# Patient Record
Sex: Female | Born: 1950 | Race: White | Hispanic: No | Smoking: Current every day smoker
Health system: Southern US, Community
[De-identification: ages and names within clinical notes are randomized; demographics above are authoritative.]

## PROBLEM LIST (undated history)

## (undated) ENCOUNTER — Emergency Department (HOSPITAL_COMMUNITY): Disposition: A | Discharge status: Other Acute Inpatient Hospital | Payer: Medicare Other

## (undated) DIAGNOSIS — R0602 Shortness of breath: Secondary | ICD-10-CM

## (undated) DIAGNOSIS — F1110 Opioid abuse, uncomplicated: Secondary | ICD-10-CM

## (undated) DIAGNOSIS — K219 Gastro-esophageal reflux disease without esophagitis: Secondary | ICD-10-CM

## (undated) DIAGNOSIS — K3184 Gastroparesis: Secondary | ICD-10-CM

## (undated) DIAGNOSIS — T7840XA Allergy, unspecified, initial encounter: Secondary | ICD-10-CM

## (undated) DIAGNOSIS — F32A Depression, unspecified: Secondary | ICD-10-CM

## (undated) DIAGNOSIS — K262 Acute duodenal ulcer with both hemorrhage and perforation: Secondary | ICD-10-CM

## (undated) DIAGNOSIS — S2249XB Multiple fractures of ribs, unspecified side, initial encounter for open fracture: Secondary | ICD-10-CM

## (undated) DIAGNOSIS — R609 Edema, unspecified: Secondary | ICD-10-CM

## (undated) DIAGNOSIS — T4145XA Adverse effect of unspecified anesthetic, initial encounter: Secondary | ICD-10-CM

## (undated) DIAGNOSIS — Z9289 Personal history of other medical treatment: Secondary | ICD-10-CM

## (undated) DIAGNOSIS — H3530 Unspecified macular degeneration: Secondary | ICD-10-CM

## (undated) DIAGNOSIS — J189 Pneumonia, unspecified organism: Secondary | ICD-10-CM

## (undated) DIAGNOSIS — W19XXXA Unspecified fall, initial encounter: Secondary | ICD-10-CM

## (undated) DIAGNOSIS — R0902 Hypoxemia: Secondary | ICD-10-CM

## (undated) DIAGNOSIS — D649 Anemia, unspecified: Secondary | ICD-10-CM

## (undated) DIAGNOSIS — R109 Unspecified abdominal pain: Secondary | ICD-10-CM

## (undated) DIAGNOSIS — K2270 Barrett's esophagus without dysplasia: Secondary | ICD-10-CM

## (undated) DIAGNOSIS — Z9981 Dependence on supplemental oxygen: Secondary | ICD-10-CM

## (undated) DIAGNOSIS — Z934 Other artificial openings of gastrointestinal tract status: Secondary | ICD-10-CM

## (undated) DIAGNOSIS — K279 Peptic ulcer, site unspecified, unspecified as acute or chronic, without hemorrhage or perforation: Secondary | ICD-10-CM

## (undated) DIAGNOSIS — F419 Anxiety disorder, unspecified: Secondary | ICD-10-CM

## (undated) DIAGNOSIS — Z8719 Personal history of other diseases of the digestive system: Secondary | ICD-10-CM

## (undated) DIAGNOSIS — M810 Age-related osteoporosis without current pathological fracture: Secondary | ICD-10-CM

## (undated) DIAGNOSIS — T8859XA Other complications of anesthesia, initial encounter: Secondary | ICD-10-CM

## (undated) DIAGNOSIS — N951 Menopausal and female climacteric states: Secondary | ICD-10-CM

## (undated) DIAGNOSIS — Z9889 Other specified postprocedural states: Secondary | ICD-10-CM

## (undated) DIAGNOSIS — S82409A Unspecified fracture of shaft of unspecified fibula, initial encounter for closed fracture: Secondary | ICD-10-CM

## (undated) DIAGNOSIS — F329 Major depressive disorder, single episode, unspecified: Secondary | ICD-10-CM

## (undated) DIAGNOSIS — K311 Adult hypertrophic pyloric stenosis: Secondary | ICD-10-CM

## (undated) DIAGNOSIS — J449 Chronic obstructive pulmonary disease, unspecified: Secondary | ICD-10-CM

## (undated) DIAGNOSIS — G8929 Other chronic pain: Secondary | ICD-10-CM

## (undated) DIAGNOSIS — E041 Nontoxic single thyroid nodule: Secondary | ICD-10-CM

## (undated) DIAGNOSIS — K267 Chronic duodenal ulcer without hemorrhage or perforation: Secondary | ICD-10-CM

## (undated) HISTORY — DX: Peptic ulcer, site unspecified, unspecified as acute or chronic, without hemorrhage or perforation: K27.9

## (undated) HISTORY — DX: Opioid abuse, uncomplicated: F11.10

## (undated) HISTORY — DX: Other chronic pain: G89.29

## (undated) HISTORY — PX: DILATION AND CURETTAGE OF UTERUS: SHX78

## (undated) HISTORY — DX: Unspecified abdominal pain: R10.9

## (undated) HISTORY — DX: Age-related osteoporosis without current pathological fracture: M81.0

## (undated) HISTORY — DX: Anemia, unspecified: D64.9

## (undated) HISTORY — PX: HERNIA REPAIR: SHX51

## (undated) HISTORY — DX: Barrett's esophagus without dysplasia: K22.70

## (undated) HISTORY — DX: Multiple fractures of ribs, unspecified side, initial encounter for open fracture: S22.49XB

## (undated) HISTORY — DX: Hypoxemia: R09.02

## (undated) HISTORY — DX: Unspecified fracture of shaft of unspecified fibula, initial encounter for closed fracture: S82.409A

## (undated) HISTORY — DX: Gastro-esophageal reflux disease without esophagitis: K21.9

## (undated) HISTORY — PX: COLONOSCOPY: SHX174

## (undated) HISTORY — DX: Allergy, unspecified, initial encounter: T78.40XA

## (undated) HISTORY — PX: CATARACT EXTRACTION W/ INTRAOCULAR LENS  IMPLANT, BILATERAL: SHX1307

## (undated) HISTORY — DX: Acute duodenal ulcer with both hemorrhage and perforation: K26.2

## (undated) HISTORY — PX: JOINT REPLACEMENT: SHX530

## (undated) HISTORY — PX: REPAIR OF PERFORATED ULCER: SHX6065

## (undated) HISTORY — DX: Nontoxic single thyroid nodule: E04.1

## (undated) HISTORY — DX: Unspecified fall, initial encounter: W19.XXXA

## (undated) HISTORY — DX: Unspecified macular degeneration: H35.30

## (undated) HISTORY — DX: Anxiety disorder, unspecified: F41.9

## (undated) HISTORY — DX: Chronic obstructive pulmonary disease, unspecified: J44.9

---

## 1972-06-24 HISTORY — PX: TONSILLECTOMY: SUR1361

## 1997-11-14 ENCOUNTER — Other Ambulatory Visit: Admission: RE | Admit: 1997-11-14 | Discharge: 1997-11-14 | Payer: Self-pay | Admitting: Obstetrics and Gynecology

## 1998-10-15 ENCOUNTER — Emergency Department (HOSPITAL_COMMUNITY): Admission: EM | Admit: 1998-10-15 | Discharge: 1998-10-15 | Payer: Self-pay | Admitting: Emergency Medicine

## 1998-10-23 ENCOUNTER — Ambulatory Visit (HOSPITAL_COMMUNITY): Admission: RE | Admit: 1998-10-23 | Discharge: 1998-10-23 | Payer: Self-pay | Admitting: Gastroenterology

## 1998-10-23 ENCOUNTER — Encounter: Payer: Self-pay | Admitting: Gastroenterology

## 1999-03-27 ENCOUNTER — Other Ambulatory Visit: Admission: RE | Admit: 1999-03-27 | Discharge: 1999-03-27 | Payer: Self-pay | Admitting: Obstetrics and Gynecology

## 1999-07-13 ENCOUNTER — Other Ambulatory Visit: Admission: RE | Admit: 1999-07-13 | Discharge: 1999-07-13 | Payer: Self-pay | Admitting: Obstetrics and Gynecology

## 2000-09-16 ENCOUNTER — Other Ambulatory Visit: Admission: RE | Admit: 2000-09-16 | Discharge: 2000-09-16 | Payer: Self-pay | Admitting: Obstetrics and Gynecology

## 2002-01-29 ENCOUNTER — Emergency Department (HOSPITAL_COMMUNITY): Admission: EM | Admit: 2002-01-29 | Discharge: 2002-01-29 | Payer: Self-pay | Admitting: Emergency Medicine

## 2002-01-29 ENCOUNTER — Encounter: Payer: Self-pay | Admitting: Emergency Medicine

## 2002-08-05 ENCOUNTER — Ambulatory Visit (HOSPITAL_COMMUNITY): Admission: RE | Admit: 2002-08-05 | Discharge: 2002-08-05 | Payer: Self-pay | Admitting: Internal Medicine

## 2002-09-24 ENCOUNTER — Encounter: Payer: Self-pay | Admitting: Internal Medicine

## 2002-09-24 ENCOUNTER — Ambulatory Visit (HOSPITAL_COMMUNITY): Admission: RE | Admit: 2002-09-24 | Discharge: 2002-09-24 | Payer: Self-pay | Admitting: Internal Medicine

## 2002-10-13 ENCOUNTER — Encounter (HOSPITAL_COMMUNITY): Admission: RE | Admit: 2002-10-13 | Discharge: 2003-01-11 | Payer: Self-pay | Admitting: Internal Medicine

## 2002-10-14 ENCOUNTER — Encounter: Payer: Self-pay | Admitting: Internal Medicine

## 2003-03-29 ENCOUNTER — Encounter: Payer: Self-pay | Admitting: Family Medicine

## 2003-03-29 ENCOUNTER — Ambulatory Visit (HOSPITAL_COMMUNITY): Admission: RE | Admit: 2003-03-29 | Discharge: 2003-03-29 | Payer: Self-pay | Admitting: Family Medicine

## 2003-06-05 ENCOUNTER — Encounter: Payer: Self-pay | Admitting: Internal Medicine

## 2003-06-25 HISTORY — PX: LAPAROSCOPIC NISSEN FUNDOPLICATION: SHX1932

## 2003-10-11 ENCOUNTER — Encounter: Admission: RE | Admit: 2003-10-11 | Discharge: 2003-10-11 | Payer: Self-pay | Admitting: Family Medicine

## 2003-11-20 ENCOUNTER — Emergency Department (HOSPITAL_COMMUNITY): Admission: EM | Admit: 2003-11-20 | Discharge: 2003-11-20 | Payer: Self-pay | Admitting: Emergency Medicine

## 2004-04-26 ENCOUNTER — Ambulatory Visit: Payer: Self-pay | Admitting: Critical Care Medicine

## 2004-06-26 DIAGNOSIS — K262 Acute duodenal ulcer with both hemorrhage and perforation: Secondary | ICD-10-CM

## 2004-06-26 HISTORY — DX: Acute duodenal ulcer with both hemorrhage and perforation: K26.2

## 2004-08-30 ENCOUNTER — Inpatient Hospital Stay (HOSPITAL_COMMUNITY): Admission: EM | Admit: 2004-08-30 | Discharge: 2004-09-05 | Payer: Self-pay | Admitting: Emergency Medicine

## 2004-08-30 ENCOUNTER — Encounter (INDEPENDENT_AMBULATORY_CARE_PROVIDER_SITE_OTHER): Payer: Self-pay | Admitting: *Deleted

## 2004-08-31 ENCOUNTER — Ambulatory Visit: Payer: Self-pay | Admitting: Internal Medicine

## 2004-10-03 ENCOUNTER — Ambulatory Visit (HOSPITAL_COMMUNITY): Admission: RE | Admit: 2004-10-03 | Discharge: 2004-10-03 | Payer: Self-pay | Admitting: Surgery

## 2004-10-12 ENCOUNTER — Ambulatory Visit (HOSPITAL_COMMUNITY): Admission: RE | Admit: 2004-10-12 | Discharge: 2004-10-12 | Payer: Self-pay | Admitting: Internal Medicine

## 2004-10-12 ENCOUNTER — Ambulatory Visit: Payer: Self-pay | Admitting: Internal Medicine

## 2004-12-02 ENCOUNTER — Emergency Department (HOSPITAL_COMMUNITY): Admission: EM | Admit: 2004-12-02 | Discharge: 2004-12-03 | Payer: Self-pay | Admitting: Emergency Medicine

## 2004-12-11 ENCOUNTER — Ambulatory Visit: Payer: Self-pay | Admitting: Internal Medicine

## 2004-12-28 ENCOUNTER — Ambulatory Visit: Payer: Self-pay | Admitting: Family Medicine

## 2005-03-21 ENCOUNTER — Ambulatory Visit: Payer: Self-pay | Admitting: Family Medicine

## 2005-03-26 ENCOUNTER — Ambulatory Visit: Payer: Self-pay | Admitting: Internal Medicine

## 2005-03-26 ENCOUNTER — Encounter (INDEPENDENT_AMBULATORY_CARE_PROVIDER_SITE_OTHER): Payer: Self-pay | Admitting: *Deleted

## 2005-06-14 ENCOUNTER — Ambulatory Visit: Payer: Self-pay | Admitting: Family Medicine

## 2005-07-03 DIAGNOSIS — K2270 Barrett's esophagus without dysplasia: Secondary | ICD-10-CM | POA: Insufficient documentation

## 2005-07-16 ENCOUNTER — Ambulatory Visit: Payer: Self-pay | Admitting: Family Medicine

## 2005-09-02 ENCOUNTER — Ambulatory Visit: Payer: Self-pay | Admitting: Family Medicine

## 2005-09-25 ENCOUNTER — Emergency Department (HOSPITAL_COMMUNITY): Admission: EM | Admit: 2005-09-25 | Discharge: 2005-09-25 | Payer: Self-pay | Admitting: Emergency Medicine

## 2005-10-01 ENCOUNTER — Ambulatory Visit: Payer: Self-pay | Admitting: Internal Medicine

## 2005-10-04 ENCOUNTER — Encounter (INDEPENDENT_AMBULATORY_CARE_PROVIDER_SITE_OTHER): Payer: Self-pay | Admitting: Specialist

## 2005-10-04 ENCOUNTER — Ambulatory Visit: Payer: Self-pay | Admitting: Internal Medicine

## 2006-02-17 ENCOUNTER — Ambulatory Visit: Payer: Self-pay | Admitting: Family Medicine

## 2006-04-09 ENCOUNTER — Ambulatory Visit: Payer: Self-pay | Admitting: Internal Medicine

## 2006-04-17 ENCOUNTER — Inpatient Hospital Stay (HOSPITAL_COMMUNITY): Admission: AD | Admit: 2006-04-17 | Discharge: 2006-04-22 | Payer: Self-pay | Admitting: Internal Medicine

## 2006-04-17 ENCOUNTER — Ambulatory Visit: Payer: Self-pay | Admitting: Internal Medicine

## 2006-04-17 ENCOUNTER — Encounter (INDEPENDENT_AMBULATORY_CARE_PROVIDER_SITE_OTHER): Payer: Self-pay | Admitting: *Deleted

## 2006-04-22 ENCOUNTER — Ambulatory Visit: Payer: Self-pay | Admitting: Internal Medicine

## 2006-05-29 ENCOUNTER — Ambulatory Visit (HOSPITAL_COMMUNITY): Admission: RE | Admit: 2006-05-29 | Discharge: 2006-05-29 | Payer: Self-pay | Admitting: Internal Medicine

## 2006-09-24 ENCOUNTER — Ambulatory Visit: Payer: Self-pay | Admitting: Internal Medicine

## 2006-09-25 ENCOUNTER — Ambulatory Visit (HOSPITAL_COMMUNITY): Admission: RE | Admit: 2006-09-25 | Discharge: 2006-09-25 | Payer: Self-pay | Admitting: Internal Medicine

## 2006-10-08 ENCOUNTER — Ambulatory Visit: Payer: Self-pay | Admitting: Internal Medicine

## 2006-10-09 ENCOUNTER — Ambulatory Visit: Payer: Self-pay | Admitting: Internal Medicine

## 2006-12-09 ENCOUNTER — Ambulatory Visit: Payer: Self-pay | Admitting: Family Medicine

## 2006-12-23 HISTORY — PX: TRUNCAL VAGOTOMY: SHX2582

## 2006-12-24 ENCOUNTER — Encounter (INDEPENDENT_AMBULATORY_CARE_PROVIDER_SITE_OTHER): Payer: Self-pay | Admitting: Surgery

## 2006-12-25 ENCOUNTER — Inpatient Hospital Stay (HOSPITAL_COMMUNITY): Admission: RE | Admit: 2006-12-25 | Discharge: 2006-12-28 | Payer: Self-pay | Admitting: Surgery

## 2007-01-07 DIAGNOSIS — K219 Gastro-esophageal reflux disease without esophagitis: Secondary | ICD-10-CM

## 2007-01-07 DIAGNOSIS — F411 Generalized anxiety disorder: Secondary | ICD-10-CM

## 2007-01-07 DIAGNOSIS — N951 Menopausal and female climacteric states: Secondary | ICD-10-CM

## 2007-01-07 DIAGNOSIS — J45909 Unspecified asthma, uncomplicated: Secondary | ICD-10-CM | POA: Insufficient documentation

## 2007-01-07 HISTORY — DX: Menopausal and female climacteric states: N95.1

## 2007-02-01 ENCOUNTER — Encounter: Payer: Self-pay | Admitting: Family Medicine

## 2007-02-25 ENCOUNTER — Encounter: Admission: RE | Admit: 2007-02-25 | Discharge: 2007-02-25 | Payer: Self-pay | Admitting: Surgery

## 2007-03-16 ENCOUNTER — Ambulatory Visit: Payer: Self-pay | Admitting: Family Medicine

## 2007-03-24 ENCOUNTER — Encounter: Payer: Self-pay | Admitting: Family Medicine

## 2007-04-02 ENCOUNTER — Inpatient Hospital Stay (HOSPITAL_COMMUNITY): Admission: EM | Admit: 2007-04-02 | Discharge: 2007-04-03 | Payer: Self-pay | Admitting: Emergency Medicine

## 2007-04-02 ENCOUNTER — Ambulatory Visit: Payer: Self-pay | Admitting: Internal Medicine

## 2007-04-06 ENCOUNTER — Ambulatory Visit: Payer: Self-pay | Admitting: Internal Medicine

## 2007-04-08 ENCOUNTER — Telehealth: Payer: Self-pay | Admitting: Family Medicine

## 2007-04-20 ENCOUNTER — Ambulatory Visit: Payer: Self-pay | Admitting: Internal Medicine

## 2007-04-21 ENCOUNTER — Ambulatory Visit: Payer: Self-pay | Admitting: Family Medicine

## 2007-04-30 ENCOUNTER — Ambulatory Visit: Payer: Self-pay | Admitting: Family Medicine

## 2007-05-04 ENCOUNTER — Encounter: Payer: Self-pay | Admitting: Family Medicine

## 2007-05-04 ENCOUNTER — Ambulatory Visit: Payer: Self-pay | Admitting: Internal Medicine

## 2007-05-08 ENCOUNTER — Telehealth: Payer: Self-pay | Admitting: Family Medicine

## 2007-05-25 ENCOUNTER — Telehealth: Payer: Self-pay | Admitting: Family Medicine

## 2007-06-01 ENCOUNTER — Encounter: Payer: Self-pay | Admitting: Family Medicine

## 2007-06-03 ENCOUNTER — Ambulatory Visit: Payer: Self-pay | Admitting: Family Medicine

## 2007-06-03 DIAGNOSIS — K3184 Gastroparesis: Secondary | ICD-10-CM

## 2007-06-03 DIAGNOSIS — K911 Postgastric surgery syndromes: Secondary | ICD-10-CM

## 2007-06-03 HISTORY — DX: Gastroparesis: K31.84

## 2007-06-06 ENCOUNTER — Emergency Department (HOSPITAL_COMMUNITY): Admission: EM | Admit: 2007-06-06 | Discharge: 2007-06-07 | Payer: Self-pay | Admitting: Emergency Medicine

## 2007-06-07 ENCOUNTER — Ambulatory Visit: Payer: Self-pay | Admitting: Psychiatry

## 2007-06-07 ENCOUNTER — Inpatient Hospital Stay (HOSPITAL_COMMUNITY): Admission: AD | Admit: 2007-06-07 | Discharge: 2007-06-11 | Payer: Self-pay | Admitting: Psychiatry

## 2007-06-23 ENCOUNTER — Telehealth: Payer: Self-pay | Admitting: Family Medicine

## 2007-07-15 ENCOUNTER — Telehealth: Payer: Self-pay | Admitting: Family Medicine

## 2007-07-23 ENCOUNTER — Ambulatory Visit: Payer: Self-pay | Admitting: Internal Medicine

## 2007-08-21 ENCOUNTER — Ambulatory Visit: Payer: Self-pay | Admitting: Family Medicine

## 2007-08-21 LAB — CONVERTED CEMR LAB
ALT: 57 units/L — ABNORMAL HIGH (ref 0–35)
AST: 48 units/L — ABNORMAL HIGH (ref 0–37)
Bilirubin, Direct: 0.2 mg/dL (ref 0.0–0.3)
Total Protein: 6.3 g/dL (ref 6.0–8.3)

## 2007-08-26 LAB — CONVERTED CEMR LAB: Valproic Acid Lvl: 68.7 ug/mL (ref 50.0–100.0)

## 2007-08-28 DIAGNOSIS — K311 Adult hypertrophic pyloric stenosis: Secondary | ICD-10-CM

## 2007-08-28 HISTORY — DX: Adult hypertrophic pyloric stenosis: K31.1

## 2007-09-23 ENCOUNTER — Telehealth: Payer: Self-pay | Admitting: Family Medicine

## 2007-10-07 ENCOUNTER — Ambulatory Visit: Payer: Self-pay | Admitting: Internal Medicine

## 2007-10-23 ENCOUNTER — Ambulatory Visit (HOSPITAL_COMMUNITY): Admission: RE | Admit: 2007-10-23 | Discharge: 2007-10-23 | Payer: Self-pay | Admitting: Internal Medicine

## 2007-10-28 ENCOUNTER — Telehealth: Payer: Self-pay | Admitting: Family Medicine

## 2007-10-28 ENCOUNTER — Ambulatory Visit: Payer: Self-pay | Admitting: Family Medicine

## 2007-11-06 ENCOUNTER — Ambulatory Visit: Payer: Self-pay | Admitting: Internal Medicine

## 2007-11-10 ENCOUNTER — Telehealth: Payer: Self-pay | Admitting: Internal Medicine

## 2007-12-21 ENCOUNTER — Ambulatory Visit: Payer: Self-pay | Admitting: Internal Medicine

## 2007-12-27 ENCOUNTER — Emergency Department (HOSPITAL_COMMUNITY): Admission: EM | Admit: 2007-12-27 | Discharge: 2007-12-27 | Payer: Self-pay | Admitting: Emergency Medicine

## 2007-12-29 ENCOUNTER — Ambulatory Visit: Payer: Self-pay | Admitting: Family Medicine

## 2008-01-05 ENCOUNTER — Ambulatory Visit: Payer: Self-pay | Admitting: Internal Medicine

## 2008-01-05 ENCOUNTER — Inpatient Hospital Stay (HOSPITAL_COMMUNITY): Admission: EM | Admit: 2008-01-05 | Discharge: 2008-01-08 | Payer: Self-pay | Admitting: Emergency Medicine

## 2008-01-07 ENCOUNTER — Encounter: Payer: Self-pay | Admitting: Gastroenterology

## 2008-01-08 ENCOUNTER — Ambulatory Visit: Payer: Self-pay | Admitting: Gastroenterology

## 2008-01-11 ENCOUNTER — Telehealth: Payer: Self-pay | Admitting: Internal Medicine

## 2008-01-12 ENCOUNTER — Telehealth: Payer: Self-pay | Admitting: Internal Medicine

## 2008-01-12 ENCOUNTER — Ambulatory Visit: Payer: Self-pay | Admitting: Internal Medicine

## 2008-01-12 LAB — CONVERTED CEMR LAB
Basophils Absolute: 0 10*3/uL (ref 0.0–0.1)
Lymphocytes Relative: 15.5 % (ref 12.0–46.0)
MCHC: 34.2 g/dL (ref 30.0–36.0)
Monocytes Relative: 7.3 % (ref 3.0–12.0)
Neutrophils Relative %: 76.1 % (ref 43.0–77.0)
RDW: 18 % — ABNORMAL HIGH (ref 11.5–14.6)

## 2008-01-14 ENCOUNTER — Telehealth: Payer: Self-pay | Admitting: Internal Medicine

## 2008-01-14 ENCOUNTER — Emergency Department (HOSPITAL_COMMUNITY): Admission: EM | Admit: 2008-01-14 | Discharge: 2008-01-14 | Payer: Self-pay | Admitting: Emergency Medicine

## 2008-01-14 ENCOUNTER — Encounter (INDEPENDENT_AMBULATORY_CARE_PROVIDER_SITE_OTHER): Payer: Self-pay | Admitting: Emergency Medicine

## 2008-01-14 ENCOUNTER — Ambulatory Visit: Payer: Self-pay | Admitting: Surgery

## 2008-01-15 ENCOUNTER — Telehealth: Payer: Self-pay | Admitting: Internal Medicine

## 2008-01-19 ENCOUNTER — Ambulatory Visit: Payer: Self-pay | Admitting: Family Medicine

## 2008-01-19 DIAGNOSIS — R609 Edema, unspecified: Secondary | ICD-10-CM

## 2008-01-19 HISTORY — DX: Edema, unspecified: R60.9

## 2008-01-25 ENCOUNTER — Telehealth: Payer: Self-pay | Admitting: Family Medicine

## 2008-01-28 ENCOUNTER — Telehealth: Payer: Self-pay | Admitting: Family Medicine

## 2008-01-29 ENCOUNTER — Telehealth: Payer: Self-pay | Admitting: Family Medicine

## 2008-02-08 ENCOUNTER — Emergency Department (HOSPITAL_COMMUNITY): Admission: EM | Admit: 2008-02-08 | Discharge: 2008-02-08 | Payer: Self-pay | Admitting: Emergency Medicine

## 2008-02-09 ENCOUNTER — Inpatient Hospital Stay (HOSPITAL_COMMUNITY): Admission: EM | Admit: 2008-02-09 | Discharge: 2008-02-26 | Payer: Self-pay | Admitting: Emergency Medicine

## 2008-02-09 ENCOUNTER — Telehealth: Payer: Self-pay | Admitting: Internal Medicine

## 2008-02-16 ENCOUNTER — Encounter (INDEPENDENT_AMBULATORY_CARE_PROVIDER_SITE_OTHER): Payer: Self-pay | Admitting: Surgery

## 2008-02-20 HISTORY — PX: GASTROJEJUNOSTOMY: SHX1697

## 2008-02-23 ENCOUNTER — Ambulatory Visit: Payer: Self-pay | Admitting: Vascular Surgery

## 2008-02-24 ENCOUNTER — Encounter (INDEPENDENT_AMBULATORY_CARE_PROVIDER_SITE_OTHER): Payer: Self-pay | Admitting: General Surgery

## 2008-03-04 ENCOUNTER — Encounter: Payer: Self-pay | Admitting: Internal Medicine

## 2008-03-10 ENCOUNTER — Telehealth: Payer: Self-pay | Admitting: Family Medicine

## 2008-03-18 ENCOUNTER — Ambulatory Visit: Payer: Self-pay | Admitting: Family Medicine

## 2008-03-22 ENCOUNTER — Encounter: Admission: RE | Admit: 2008-03-22 | Discharge: 2008-03-22 | Payer: Self-pay | Admitting: Family Medicine

## 2008-03-23 ENCOUNTER — Encounter: Payer: Self-pay | Admitting: Family Medicine

## 2008-04-11 ENCOUNTER — Ambulatory Visit: Payer: Self-pay | Admitting: Family Medicine

## 2008-04-22 ENCOUNTER — Encounter: Admission: RE | Admit: 2008-04-22 | Discharge: 2008-04-22 | Payer: Self-pay | Admitting: Obstetrics and Gynecology

## 2008-05-02 ENCOUNTER — Encounter: Payer: Self-pay | Admitting: Internal Medicine

## 2008-06-24 HISTORY — PX: ORIF HIP FRACTURE: SHX2125

## 2008-06-24 HISTORY — PX: FRACTURE SURGERY: SHX138

## 2008-06-29 ENCOUNTER — Encounter: Admission: RE | Admit: 2008-06-29 | Discharge: 2008-06-29 | Payer: Self-pay | Admitting: Orthopedic Surgery

## 2008-07-13 ENCOUNTER — Encounter: Admission: RE | Admit: 2008-07-13 | Discharge: 2008-08-09 | Payer: Self-pay | Admitting: Orthopedic Surgery

## 2008-07-14 ENCOUNTER — Encounter: Payer: Self-pay | Admitting: Internal Medicine

## 2008-08-10 ENCOUNTER — Emergency Department (HOSPITAL_COMMUNITY): Admission: EM | Admit: 2008-08-10 | Discharge: 2008-08-11 | Payer: Self-pay | Admitting: Emergency Medicine

## 2008-08-12 ENCOUNTER — Encounter: Admission: RE | Admit: 2008-08-12 | Discharge: 2008-08-12 | Payer: Self-pay | Admitting: Surgery

## 2008-09-13 ENCOUNTER — Other Ambulatory Visit: Payer: Self-pay | Admitting: Emergency Medicine

## 2008-09-13 ENCOUNTER — Inpatient Hospital Stay (HOSPITAL_COMMUNITY): Admission: AD | Admit: 2008-09-13 | Discharge: 2008-09-16 | Payer: Self-pay | Admitting: Psychiatry

## 2008-09-13 ENCOUNTER — Ambulatory Visit: Payer: Self-pay | Admitting: Psychiatry

## 2008-09-26 ENCOUNTER — Ambulatory Visit: Payer: Self-pay | Admitting: Family Medicine

## 2008-10-18 ENCOUNTER — Emergency Department (HOSPITAL_COMMUNITY): Admission: EM | Admit: 2008-10-18 | Discharge: 2008-10-18 | Payer: Self-pay | Admitting: Emergency Medicine

## 2008-10-22 ENCOUNTER — Emergency Department (HOSPITAL_COMMUNITY): Admission: EM | Admit: 2008-10-22 | Discharge: 2008-10-23 | Payer: Self-pay | Admitting: Emergency Medicine

## 2008-12-23 ENCOUNTER — Emergency Department (HOSPITAL_COMMUNITY): Admission: EM | Admit: 2008-12-23 | Discharge: 2008-12-23 | Payer: Self-pay | Admitting: Emergency Medicine

## 2009-01-09 ENCOUNTER — Ambulatory Visit: Payer: Self-pay | Admitting: Family Medicine

## 2009-01-12 ENCOUNTER — Encounter: Payer: Self-pay | Admitting: Family Medicine

## 2009-01-30 ENCOUNTER — Telehealth: Payer: Self-pay | Admitting: Family Medicine

## 2009-02-22 ENCOUNTER — Emergency Department (HOSPITAL_COMMUNITY): Admission: EM | Admit: 2009-02-22 | Discharge: 2009-02-22 | Payer: Self-pay | Admitting: Emergency Medicine

## 2009-02-24 ENCOUNTER — Encounter: Payer: Self-pay | Admitting: Family Medicine

## 2009-04-03 ENCOUNTER — Encounter: Payer: Self-pay | Admitting: Family Medicine

## 2009-04-04 ENCOUNTER — Encounter: Payer: Self-pay | Admitting: Family Medicine

## 2009-05-11 ENCOUNTER — Ambulatory Visit: Payer: Self-pay | Admitting: Family Medicine

## 2009-05-15 ENCOUNTER — Emergency Department (HOSPITAL_COMMUNITY): Admission: EM | Admit: 2009-05-15 | Discharge: 2009-05-15 | Payer: Self-pay | Admitting: Emergency Medicine

## 2009-05-29 ENCOUNTER — Telehealth: Payer: Self-pay | Admitting: Internal Medicine

## 2009-05-31 ENCOUNTER — Ambulatory Visit: Payer: Self-pay | Admitting: Family Medicine

## 2009-06-19 ENCOUNTER — Telehealth: Payer: Self-pay | Admitting: Family Medicine

## 2009-06-20 ENCOUNTER — Telehealth: Payer: Self-pay | Admitting: Family Medicine

## 2009-08-11 ENCOUNTER — Ambulatory Visit: Payer: Self-pay | Admitting: Family Medicine

## 2009-08-11 DIAGNOSIS — A689 Relapsing fever, unspecified: Secondary | ICD-10-CM

## 2009-08-11 LAB — CONVERTED CEMR LAB
Blood in Urine, dipstick: NEGATIVE
Glucose, Urine, Semiquant: NEGATIVE
Ketones, urine, test strip: NEGATIVE
Specific Gravity, Urine: 1.025
pH: 5

## 2009-08-14 LAB — CONVERTED CEMR LAB
ALT: 10 units/L (ref 0–35)
Alkaline Phosphatase: 74 units/L (ref 39–117)
Basophils Relative: 1.5 % (ref 0.0–3.0)
Bilirubin, Direct: 0 mg/dL (ref 0.0–0.3)
Chloride: 108 meq/L (ref 96–112)
Creatinine, Ser: 0.7 mg/dL (ref 0.4–1.2)
Eosinophils Relative: 1.9 % (ref 0.0–5.0)
Hemoglobin: 10.6 g/dL — ABNORMAL LOW (ref 12.0–15.0)
MCV: 84.4 fL (ref 78.0–100.0)
Monocytes Absolute: 0.6 10*3/uL (ref 0.1–1.0)
Neutro Abs: 5.5 10*3/uL (ref 1.4–7.7)
Neutrophils Relative %: 69.7 % (ref 43.0–77.0)
Potassium: 4.2 meq/L (ref 3.5–5.1)
RBC: 3.91 M/uL (ref 3.87–5.11)
Sodium: 137 meq/L (ref 135–145)
Total Protein: 5.6 g/dL — ABNORMAL LOW (ref 6.0–8.3)
WBC: 7.8 10*3/uL (ref 4.5–10.5)

## 2009-08-15 ENCOUNTER — Encounter: Payer: Self-pay | Admitting: Family Medicine

## 2009-08-16 ENCOUNTER — Ambulatory Visit: Payer: Self-pay | Admitting: Family Medicine

## 2009-08-17 ENCOUNTER — Ambulatory Visit: Payer: Self-pay | Admitting: Internal Medicine

## 2009-08-18 ENCOUNTER — Ambulatory Visit: Payer: Self-pay | Admitting: Cardiology

## 2009-08-18 ENCOUNTER — Telehealth: Payer: Self-pay | Admitting: Family Medicine

## 2009-08-21 ENCOUNTER — Telehealth: Payer: Self-pay | Admitting: Family Medicine

## 2009-08-24 ENCOUNTER — Telehealth: Payer: Self-pay | Admitting: Family Medicine

## 2009-08-25 ENCOUNTER — Encounter: Payer: Self-pay | Admitting: Family Medicine

## 2009-08-31 ENCOUNTER — Encounter: Payer: Self-pay | Admitting: Family Medicine

## 2009-09-08 ENCOUNTER — Encounter: Payer: Self-pay | Admitting: Family Medicine

## 2009-10-11 ENCOUNTER — Telehealth: Payer: Self-pay | Admitting: Family Medicine

## 2009-10-17 ENCOUNTER — Telehealth: Payer: Self-pay | Admitting: Family Medicine

## 2009-10-27 ENCOUNTER — Encounter: Payer: Self-pay | Admitting: Family Medicine

## 2009-11-16 ENCOUNTER — Telehealth: Payer: Self-pay | Admitting: Family Medicine

## 2009-11-17 ENCOUNTER — Ambulatory Visit: Payer: Self-pay | Admitting: Family Medicine

## 2009-11-17 DIAGNOSIS — G2401 Drug induced subacute dyskinesia: Secondary | ICD-10-CM | POA: Insufficient documentation

## 2009-11-24 ENCOUNTER — Telehealth: Payer: Self-pay | Admitting: Family Medicine

## 2009-11-27 ENCOUNTER — Telehealth: Payer: Self-pay | Admitting: Family Medicine

## 2009-11-28 ENCOUNTER — Encounter: Payer: Self-pay | Admitting: Family Medicine

## 2009-12-08 ENCOUNTER — Telehealth: Payer: Self-pay | Admitting: Family Medicine

## 2009-12-13 ENCOUNTER — Telehealth: Payer: Self-pay | Admitting: Family Medicine

## 2009-12-22 ENCOUNTER — Encounter: Payer: Self-pay | Admitting: Family Medicine

## 2009-12-29 HISTORY — PX: ESOPHAGOGASTRODUODENOSCOPY: SHX1529

## 2010-01-08 ENCOUNTER — Encounter: Payer: Self-pay | Admitting: Family Medicine

## 2010-01-10 ENCOUNTER — Telehealth: Payer: Self-pay | Admitting: Family Medicine

## 2010-01-10 DIAGNOSIS — F323 Major depressive disorder, single episode, severe with psychotic features: Secondary | ICD-10-CM

## 2010-01-11 ENCOUNTER — Telehealth: Payer: Self-pay | Admitting: Family Medicine

## 2010-03-15 ENCOUNTER — Telehealth: Payer: Self-pay | Admitting: Family Medicine

## 2010-03-15 DIAGNOSIS — J984 Other disorders of lung: Secondary | ICD-10-CM | POA: Insufficient documentation

## 2010-04-16 ENCOUNTER — Telehealth: Payer: Self-pay | Admitting: Family Medicine

## 2010-04-16 ENCOUNTER — Ambulatory Visit: Payer: Self-pay | Admitting: Family Medicine

## 2010-06-06 DIAGNOSIS — F331 Major depressive disorder, recurrent, moderate: Secondary | ICD-10-CM

## 2010-06-07 ENCOUNTER — Ambulatory Visit: Payer: Self-pay | Admitting: Family Medicine

## 2010-06-08 LAB — CONVERTED CEMR LAB
AST: 17 units/L (ref 0–37)
Alkaline Phosphatase: 64 units/L (ref 39–117)
BUN: 12 mg/dL (ref 6–23)
Basophils Absolute: 0.1 10*3/uL (ref 0.0–0.1)
Calcium: 8.8 mg/dL (ref 8.4–10.5)
GFR calc non Af Amer: 79.33 mL/min (ref >60.00)
Glucose, Bld: 106 mg/dL — ABNORMAL HIGH (ref 70–99)
Lymphocytes Relative: 23.7 % (ref 12.0–46.0)
Monocytes Relative: 7.5 % (ref 3.0–12.0)
Platelets: 237 10*3/uL (ref 150.0–400.0)
RDW: 15.3 % — ABNORMAL HIGH (ref 11.5–14.6)
Sodium: 139 meq/L (ref 135–145)
Total Bilirubin: 0.2 mg/dL — ABNORMAL LOW (ref 0.3–1.2)

## 2010-06-27 ENCOUNTER — Encounter: Payer: Self-pay | Admitting: Family Medicine

## 2010-07-05 ENCOUNTER — Telehealth: Payer: Self-pay | Admitting: Family Medicine

## 2010-07-15 ENCOUNTER — Encounter: Payer: Self-pay | Admitting: Family Medicine

## 2010-07-24 NOTE — Procedures (Signed)
Summary: [facility] End[facility]  [facility] Endoscopy/[facility]   Imported By: Maryln Gottron 11/10/2009 12:42:40  _____________________________________________________________________  External Attachment:    Type:   Image     Comment:   External Document

## 2010-07-24 NOTE — Progress Notes (Signed)
Summary: cindy please call  Phone Note Call from Patient Call back at Home Phone [phone]   Caller: Patient Call For: Nelwyn Salisbury MD Summary of Call: pt would like cindy to return her call concerning pain management center. Initial call taken by: Heron Sabins,  August 21, 2009 11:29 AM  Follow-up for Phone Call        [facility]   Alfred Levins, [place]  August 21, 2009 12:59 PM  Additional Follow-up for Phone Call Additional follow up Details #1::        pt wants to know if she withdraws from the pain clinic if you would support that.  She is crying.  Pt has gone 12 days without any medicine and she is a wreck.  Her mom doesn't want anything to do with her and she does not know what else to do.  She is weak and can't function. Additional Follow-up by: Alfred Levins, CMA,  August 21, 2009 3:10 PM    Additional Follow-up for Phone Call Additional follow up Details #2::    as I have told her before, I cannot supply her with pain meds. I suggest she stay with her pain clinic or else check herself into a detox/rehab facility to get off narcotics safely Follow-up by: Nelwyn Salisbury MD,  August 21, 2009 4:42 PM  Additional Follow-up for Phone Call Additional follow up Details #3:: Details for Additional Follow-up Action Taken: [facility]   Alfred Levins, CMA  August 21, 2009 4:51 PM pt aware Additional Follow-up by: Alfred Levins, CMA,  August 22, 2009 9:46 AM

## 2010-07-24 NOTE — Progress Notes (Signed)
Summary: CT results  Phone Note Call from Patient   Caller: Patient Call For: Nelwyn Salisbury MD Summary of Call: Pt is calling for CT results. [phone] Initial call taken by: Lynann Beaver CMA,  August 18, 2009 2:21 PM  Follow-up for Phone Call        see report Follow-up by: Nelwyn Salisbury MD,  August 18, 2009 5:13 PM  Additional Follow-up for Phone Call Additional follow up Details #1::        pt aware Additional Follow-up by: Alfred Levins, CMA,  August 18, 2009 5:16 PM

## 2010-07-24 NOTE — Letter (Signed)
Summary: [facility]  [facility]   Imported By: Maryln Gottron 09/06/2009 09:27:02  _____________________________________________________________________  External Attachment:    Type:   Image     Comment:   External Document

## 2010-07-24 NOTE — Progress Notes (Signed)
  Phone Note Call from Patient Call back at Home Phone [phone]   Caller: Patient Call For: Nelwyn Salisbury MD Reason for Call: Talk to Nurse Summary of Call: patient called for judy but would not leave any information Initial call taken by: Kern Reap CMA Duncan Dull),  January 11, 2010 2:31 PM

## 2010-07-24 NOTE — Assessment & Plan Note (Signed)
Summary: med check/cjr   Vital Signs:  Patient profile:   60 year old female Weight:      123 pounds O2 Sat:      92 % Temp:     98.1 degrees F Pulse rate:   74 / minute BP sitting:   100 / 62  Vitals Entered By: Pura Spice, RN (April 16, 2010 10:14 AM) CC: med reck    History of Present Illness: Here to ask for help with anxiety and pain management. She still goes to her pain management clinic, but she says they don't give her enough pain meds. She is in constant abdominal pain, and taking Vicodin several times a day is not enough. She is extremely anxious all the time, and she can't sleep. She had some good results with Ambien in the past, and she would like some more. She is on at least 4mg  a day of Alprazolam, but often takes more.   Preventive Screening-Counseling & Management  Alcohol-Tobacco     Smoking Status: current     Packs/Day: 2.0     Year Started: 1967  Allergies: 1)  ! Morphine 2)  ! * Seroquel 3)  Chantix (Varenicline Tartrate) 4)  Xanax (Alprazolam)  Past History:  Past Medical History: Reviewed history from 08/11/2009 and no changes required.  Peptic ulcer disease, sees Dr. Randa Evens gastric outlet obstruction gastroparesis GASTRIC OUTLET OBSTRUCTION (ICD-537.0) CIGARETTE SMOKER (ICD-305.1) ACUTE DUODEN ULCER W/HEMORR PERF&OBSTRUCTION (ICD-532.21) BARRETT'S ESOPHAGUS, HX OF (ICD-V12.79) ANXIETY (ICD-300.00) MENOPAUSE (ICD-627.2) GERD (ICD-530.81) ASTHMA (ICD-493.90) chronic abdominal pain with narcotic dependence, sees Dr. Elvin So at [facility]  Past Surgical History: multiple EGDs, last on 12-29-09 per Dr. Lynnell Grain at [facility], several gastric ulcers  laparoscopic Nissan, vagotomy, and Roux-en-Y gastrojejunostomy per Dr. Daphine Deutscher 02-20-08  Social History: Packs/Day:  2.0  Review of Systems  The patient denies anorexia, fever, weight loss, weight gain, vision loss, decreased hearing, hoarseness, chest pain, syncope,  dyspnea on exertion, peripheral edema, prolonged cough, headaches, hemoptysis, melena, hematochezia, severe indigestion/heartburn, hematuria, incontinence, genital sores, muscle weakness, suspicious skin lesions, transient blindness, difficulty walking, depression, unusual weight change, abnormal bleeding, enlarged lymph nodes, angioedema, breast masses, and testicular masses.    Physical Exam  General:  Well-developed,well-nourished,in no acute distress; alert,appropriate and cooperative throughout examination Abdomen:  soft, normal bowel sounds, no distention, no masses, no guarding, no rigidity, no rebound tenderness, no abdominal hernia, no hepatomegaly, and no splenomegaly.  Diffusely tender Psych:  Oriented X3, memory intact for recent and remote, normally interactive, good eye contact, and moderately anxious.     Impression & Recommendations:  Problem # 1:  SYMPTOM, PAIN, ABDOMINAL, GENERALIZED (ICD-789.07)  Problem # 2:  PEPTIC ULCER DISEASE (ICD-533.90)  Her updated medication list for this problem includes:    Carafate 1 Gm/6ml Susp (Sucralfate) .Marland Kitchen... Take 10 ml (2 teaspoons) by mouth four times per day    Prilosec 40 Mg Cpdr (Omeprazole) ..... Once daily  Problem # 3:  ANXIETY (ICD-300.00)  The following medications were removed from the medication list:    Alprazolam 1 Mg Tabs (Alprazolam) .Marland KitchenMarland KitchenMarland KitchenMarland Kitchen 4 times a day Her updated medication list for this problem includes:    Prozac 40 Mg Caps (Fluoxetine hcl) .Marland Kitchen... 2 tabs once daily    Alprazolam 2 Mg Tabs (Alprazolam) ..... One tab 4 times a day  Complete Medication List: 1)  Promethazine Hcl 25 Mg Tabs (Promethazine hcl) .Marland Kitchen.. 1 q 6 hours as needed nausea 2)  Carafate 1 Gm/53ml Susp (Sucralfate) .Marland KitchenMarland KitchenMarland Kitchen  Take 10 ml (2 teaspoons) by mouth four times per day 3)  Prozac 40 Mg Caps (Fluoxetine hcl) .... 2 tabs once daily 4)  Prilosec 40 Mg Cpdr (Omeprazole) .... Once daily 5)  Feosol 200 (65 Fe) Mg Tabs (Ferrous sulfate dried) 6)   Hydrocodone-acetaminophen 10-325 Mg Tabs (Hydrocodone-acetaminophen) .Marland Kitchen.. 1 qid 7)  Alprazolam 2 Mg Tabs (Alprazolam) .... One tab 4 times a day 8)  Zolpidem Tartrate 10 Mg Tabs (Zolpidem tartrate) .... At bedtime  Patient Instructions: 1)  I told her that she needs to talk to her pain management doctor about her pain needs, but that I would try to help her anxiety. We will increase Alprazolam to 2 mg 4 times a day. Add back Zolpidem.  2)  Please schedule a follow-up appointment as needed .  Prescriptions: ZOLPIDEM TARTRATE 10 MG TABS (ZOLPIDEM TARTRATE) at bedtime  #30 x 5   Entered and Authorized by:   Nelwyn Salisbury MD   Signed by:   Nelwyn Salisbury MD on 04/16/2010   Method used:   Print then Give to Patient   RxID:   [id number] ALPRAZOLAM 2 MG TABS (ALPRAZOLAM) one tab 4 times a day  #120 x 5   Entered and Authorized by:   Nelwyn Salisbury MD   Signed by:   Nelwyn Salisbury MD on 04/16/2010   Method used:   Print then Give to Patient   RxID:   [id number]    Orders Added: 1)  Est. Patient Level IV [84696]

## 2010-07-24 NOTE — Procedures (Signed)
Summary: EGD Report/[facility]  EGD Report/[facility]   Imported By: Maryln Gottron 01/04/2010 14:30:41  _____________________________________________________________________  External Attachment:    Type:   Image     Comment:   External Document

## 2010-07-24 NOTE — Progress Notes (Signed)
Summary: judy please call  Phone Note Call from Patient Call back at Home Phone [phone]   Caller: Patient Call For: Nelwyn Salisbury MD Reason for Call: Insurance Question Summary of Call: pt would like judy to return her call, pt was seen last wk Initial call taken by: Heron Sabins,  November 27, 2009 2:56 PM  Follow-up for Phone Call        [facility] Follow-up by: Raechel Ache, RN,  November 27, 2009 2:58 PM  Additional Follow-up for Phone Call Additional follow up Details #1::        wanted something for her "nerves"; advised she'll have to wait for Dr Clent Ridges to return. Additional Follow-up by: Raechel Ache, RN,  November 27, 2009 3:14 PM

## 2010-07-24 NOTE — Miscellaneous (Signed)
Summary: Orders Update  Clinical Lists Changes  Orders: Added new Test order of T-2 View CXR (71020TC) - Signed 

## 2010-07-24 NOTE — Assessment & Plan Note (Signed)
Summary: fever x 2 weeks/dm   Vital Signs:  Patient profile:   60 year old female Weight:      109 pounds BMI:     19.38 Temp:     99.0 degrees F oral BP sitting:   110 / 66  (left arm) Cuff size:   regular  Vitals Entered By: Alfred Levins, CMA (August 11, 2009 2:09 PM) CC: low grade fever off and on x2 wks, Back Pain   History of Present Illness: here with her mother with concerns over an intermittent low grade fever for the past 2 weeks and for a 20 lb weight loss over the past month. No other specific symptoms at all, other than her usual chonic abdominal pains. No sinus symptoms or ST or cough or urinary symptoms.    Current Medications (verified): 1)  Promethazine Hcl 25 Mg  Tabs (Promethazine Hcl) .Marland Kitchen.. 1 Q 6 Hours As Needed Nausea 2)  Carafate 1 Gm/52ml Susp (Sucralfate) .... Take 10 Ml (2 Teaspoons) By Mouth Four Times Per Day 3)  Prozac 40 Mg Caps (Fluoxetine Hcl) .... 2 Tabs Once Daily 4)  Alprazolam 1 Mg Tabs (Alprazolam) .... 4 Times A Day 5)  Suboxone 2-0.5 Mg Subl (Buprenorphine Hcl-Naloxone Hcl) .... As Needed 6)  Nicotrol 10 Mg Inha (Nicotine) .... Use Daily As Needed 7)  Prilosec 40 Mg Cpdr (Omeprazole) .... Once Daily  Allergies (verified): 1)  ! Morphine 2)  Chantix (Varenicline Tartrate) 3)  Xanax (Alprazolam)  Past History:  Past Medical History:  Peptic ulcer disease, sees Dr. Randa Evens gastric outlet obstruction gastroparesis GASTRIC OUTLET OBSTRUCTION (ICD-537.0) CIGARETTE SMOKER (ICD-305.1) ACUTE DUODEN ULCER W/HEMORR PERF&OBSTRUCTION (ICD-532.21) BARRETT'S ESOPHAGUS, HX OF (ICD-V12.79) ANXIETY (ICD-300.00) MENOPAUSE (ICD-627.2) GERD (ICD-530.81) ASTHMA (ICD-493.90) chronic abdominal pain with narcotic dependence, sees Dr. Elvin So at [facility]  Past Surgical History: Reviewed history from 09/26/2008 and no changes required. Endoscopy laparoscopic Nissan, vagotomy, and Roux-en-Y gastrojejunostomy per Dr. Daphine Deutscher  02-20-08  Review of Systems  The patient denies anorexia, weight gain, vision loss, decreased hearing, hoarseness, chest pain, syncope, dyspnea on exertion, peripheral edema, prolonged cough, headaches, hemoptysis, abdominal pain, melena, hematochezia, severe indigestion/heartburn, hematuria, incontinence, genital sores, muscle weakness, suspicious skin lesions, transient blindness, difficulty walking, depression, unusual weight change, abnormal bleeding, enlarged lymph nodes, angioedema, breast masses, and testicular masses.    Physical Exam  General:  Well-developed,well-nourished,in no acute distress; alert,appropriate and cooperative throughout examination Head:  Normocephalic and atraumatic without obvious abnormalities. No apparent alopecia or balding. Eyes:  No corneal or conjunctival inflammation noted. EOMI. Perrla. Funduscopic exam benign, without hemorrhages, exudates or papilledema. Vision grossly normal. Ears:  External ear exam shows no significant lesions or deformities.  Otoscopic examination reveals clear canals, tympanic membranes are intact bilaterally without bulging, retraction, inflammation or discharge. Hearing is grossly normal bilaterally. Nose:  External nasal examination shows no deformity or inflammation. Nasal mucosa are pink and moist without lesions or exudates. Mouth:  Oral mucosa and oropharynx without lesions or exudates.  Teeth in good repair. Neck:  No deformities, masses, or tenderness noted. Lungs:  Normal respiratory effort, chest expands symmetrically. Lungs are clear to auscultation, no crackles or wheezes. Heart:  Normal rate and regular rhythm. S1 and S2 normal without gallop, murmur, click, rub or other extra sounds. Abdomen:  soft, normal bowel sounds, no distention, no masses, no guarding, no rigidity, no rebound tenderness, no abdominal hernia, no inguinal hernia, no hepatomegaly, and no splenomegaly.  Mildly tender diffusely. Skin:  Intact without  suspicious lesions  or rashes Cervical Nodes:  No lymphadenopathy noted Axillary Nodes:  No palpable lymphadenopathy Inguinal Nodes:  No significant adenopathy   Impression & Recommendations:  Problem # 1:  WEIGHT LOSS (ICD-783.21)  Orders: UA Dipstick w/o Micro (automated)  (81003) Venipuncture (04540) TLB-BMP (Basic Metabolic Panel-BMET) (80048-METABOL) TLB-CBC Platelet - w/Differential (85025-CBCD) TLB-Hepatic/Liver Function Pnl (80076-HEPATIC) TLB-TSH (Thyroid Stimulating Hormone) (98119-JYN) Radiology Referral (Radiology)  Problem # 2:  FEVER, RECURRENT (ICD-087.9)  Complete Medication List: 1)  Promethazine Hcl 25 Mg Tabs (Promethazine hcl) .Marland Kitchen.. 1 q 6 hours as needed nausea 2)  Carafate 1 Gm/65ml Susp (Sucralfate) .... Take 10 ml (2 teaspoons) by mouth four times per day 3)  Prozac 40 Mg Caps (Fluoxetine hcl) .... 2 tabs once daily 4)  Alprazolam 1 Mg Tabs (Alprazolam) .... 4 times a day 5)  Suboxone 2-0.5 Mg Subl (Buprenorphine hcl-naloxone hcl) .... As needed 6)  Nicotrol 10 Mg Inha (Nicotine) .... Use daily as needed 7)  Prilosec 40 Mg Cpdr (Omeprazole) .... Once daily  Patient Instructions: 1)  No specific explanations for her fever or weight loss are apparent today. Noted is her long hx of tobacco use. Get labs today. Set up contrasted CT of chest, abdomen, and pelvis next week. Encouraged her to eat as much nutritious food as possible.   Laboratory Results   Urine Tests    Routine Urinalysis   Color: yellow Appearance: Clear Glucose: negative   (Normal Range: Negative) Bilirubin: 1+   (Normal Range: Negative) Ketone: negative   (Normal Range: Negative) Spec. Gravity: 1.025   (Normal Range: 1.003-1.035) Blood: negative   (Normal Range: Negative) pH: 5.0   (Normal Range: 5.0-8.0) Protein: trace   (Normal Range: Negative) Urobilinogen: 0.2   (Normal Range: 0-1) Nitrite: negative   (Normal Range: Negative) Leukocyte Esterace: trace   (Normal Range:  Negative)    Comments: [name]  August 11, 2009 3:56 PM

## 2010-07-24 NOTE — Progress Notes (Signed)
Summary: wants to quit smoking  Phone Note Call from Patient Call back at Home Phone [phone]   Caller: Patient Call For: [name] Salisbury MD Summary of Call: wants to quit smoking. Tried Chantix before and it made her sick. Is there anything else? Asked about counseling or hypnotism? Initial call taken by: Raechel Ache, RN,  October 11, 2009 3:08 PM  Follow-up for Phone Call        both of those are viable options but insurance will not cover them. Please try whatever she wants. OTC patches are helpful.  Follow-up by: [name] Salisbury MD,  October 16, 2009 9:03 AM  Additional Follow-up for Phone Call Additional follow up Details #1::        Phone Call Completed Additional Follow-up by: Raechel Ache, RN,  October 16, 2009 11:20 AM

## 2010-07-24 NOTE — Letter (Signed)
Summary: [facility]  [facility]   Imported By: Maryln Gottron 01/11/2010 14:40:27  _____________________________________________________________________  External Attachment:    Type:   Image     Comment:   External Document

## 2010-07-24 NOTE — Progress Notes (Signed)
Summary: please return call  Phone Note Call from Patient Call back at Home Phone [phone]   Caller: Patient--live call Summary of Call: have more ? for judy. please return call Initial call taken by: Warnell Forester,  December 13, 2009 2:28 PM  Follow-up for Phone Call        wants to know how long she should wait after taking Benadryl- still having signs. Per Dr Clent Ridges go to ER if not better 4 hrs after Benadryl. Follow-up by: Raechel Ache, RN,  December 13, 2009 3:51 PM

## 2010-07-24 NOTE — Progress Notes (Signed)
Summary: rx depakote   Phone Note Call from Patient   Caller: Patient Call For: Nelwyn Salisbury MD Summary of Call: Pt is asking for a medication for OCD. [phone] Initial call taken by: [facility] CMA AAMA,  April 16, 2010 2:58 PM  Follow-up for Phone Call        call in Depakote 250 mg at bedtime , #30 with 2 rf. Take this in addition to her other meds.  Follow-up by: Nelwyn Salisbury MD,  April 16, 2010 4:50 PM  Additional Follow-up for Phone Call Additional follow up Details #1::        pt notified and requested to call to [place].  Additional Follow-up by: Pura Spice, RN,  April 17, 2010 8:05 AM    New/Updated Medications: DEPAKOTE 250 MG TBEC (DIVALPROEX SODIUM) take 1 at bedtime Prescriptions: DEPAKOTE 250 MG TBEC (DIVALPROEX SODIUM) take 1 at bedtime  #30 x 2   Entered by:   Pura Spice, RN   Authorized by:   Nelwyn Salisbury MD   Signed by:   Pura Spice, RN on 04/17/2010   Method used:   Electronically to        [facility]* (retail)       [address]       [place], [place]  [id number]       Ph: [phone]       Fax: [phone]   RxID:   [id number]

## 2010-07-24 NOTE — Progress Notes (Signed)
Summary: questions  Phone Note Call from Patient   Caller: Patient Call For: Nelwyn Salisbury MD Summary of Call: Pt is asking for a name of someone that can do counseling and hypnotism. Is  requesting Ambien to be called to [facility]. [phone]  Initial call taken by: Lynann Beaver CMA,  December 08, 2009 3:09 PM  Follow-up for Phone Call        I suggest she call Towanda Malkin, a therapist at our [place] office. She should call [phone] to set up an appt. Also, call in Ambien 10 mg at bedtime #30 with 2 rf Follow-up by: Nelwyn Salisbury MD,  December 08, 2009 5:17 PM  Additional Follow-up for Phone Call Additional follow up Details #1::        LMOM, Rx phoned in. Additional Follow-up by: Raechel Ache, RN,  December 08, 2009 5:21 PM    Additional Follow-up for Phone Call Additional follow up Details #2::    given name of therapist. Follow-up by: Raechel Ache, RN,  December 11, 2009 10:29 AM  New/Updated Medications: AMBIEN 10 MG TABS (ZOLPIDEM TARTRATE) 1 at bedtime Prescriptions: AMBIEN 10 MG TABS (ZOLPIDEM TARTRATE) 1 at bedtime  #30 x 2   Entered by:   Raechel Ache, RN   Authorized by:   Nelwyn Salisbury MD   Signed by:   Raechel Ache, RN on 12/11/2009   Method used:   Historical   RxID:   [id number]

## 2010-07-24 NOTE — Progress Notes (Signed)
Summary: CT scan  Phone Note Call from Patient Call back at Home Phone [phone]   Caller: Patient Call For: Nelwyn Salisbury MD Summary of Call: Pt states it is time to reschedule her 6 months chest CT. Initial call taken by: Lynann Beaver CMA,  March 15, 2010 1:33 PM  Follow-up for Phone Call        I agree. Set up a contrasted chest CT to follow up right lung nodules seen in February  Follow-up by: Nelwyn Salisbury MD,  March 16, 2010 8:27 AM  Additional Follow-up for Phone Call Additional follow up Details #1::        referral sent to terri.  pt notifed Camelia Eng will call her.  Additional Follow-up by: Pura Spice, RN,  March 16, 2010 12:15 PM  New Problems: LUNG NODULE [phone])   New Problems: LUNG NODULE (ICD-518.89)

## 2010-07-24 NOTE — Progress Notes (Signed)
Summary: neurological complaints  Phone Note Call from Patient   Caller: Patient Call For: Nelwyn Salisbury MD Summary of Call: Pt. calls "obviously stuttering"  and is complaining of shaking.......stuttering.  She thinks it is coming from the Seroquel.  Spoke to Dr. Lovell Sheehan, and he suggested to the ER if continues to get worse.  Stop the Seroquel, and see Dr. Clent Ridges tomorrow if she feels comfortable waiting.  Pt. and her roomate seem very anxious and apprehensive........advised ER.  They plan to go ASAP.  She has no weakness of any extremity or slurred speech. Initial call taken by: Lynann Beaver CMA,  Nov 16, 2009 1:43 PM  Follow-up for Phone Call        noted Follow-up by: Nelwyn Salisbury MD,  Nov 17, 2009 8:21 AM

## 2010-07-24 NOTE — Procedures (Signed)
Summary: Upper GI Endoscopy/[facility]  Upper GI Endoscopy/[facility]   Imported By: Maryln Gottron 09/19/2009 14:55:03  _____________________________________________________________________  External Attachment:    Type:   Image     Comment:   External Document

## 2010-07-24 NOTE — Assessment & Plan Note (Signed)
Summary: stuttering/?meds/njr   Vital Signs:  Patient profile:   60 year old female Weight:      117 pounds BMI:     20.80 Temp:     99.7 degrees F oral BP sitting:   128 / 88  (left arm) Cuff size:   regular  Vitals Entered By: Raechel Ache, RN (Nov 17, 2009 10:28 AM) CC: C/o shaking and stuttering since Wed.    History of Present Illness: Here with her partner for 3 days of involuntary twitches and jerks of her face, mouth, and neck. This makes it very difficult for her to talk. No SOB or any other symptoms. She has been taking Seroquel for about 4 weeks now, and it is noted that she took this fer several months last year and tolerated it well. Dr. Randa Evens recently did a repeat EGD and found her to have several gastric ulcers. She also has a chronically elevated gastrin level, and he thinks this is repsonsibile for most of her GI issues. He has referred her to a GI specialist in this area at [facility], Dr. Leonarda Salon, and he will see her on 11-27-09.   Allergies: 1)  ! Morphine 2)  ! * Seroquel 3)  Chantix (Varenicline Tartrate) 4)  Xanax (Alprazolam)  Past History:  Past Medical History: Reviewed history from 08/11/2009 and no changes required.  Peptic ulcer disease, sees Dr. Randa Evens gastric outlet obstruction gastroparesis GASTRIC OUTLET OBSTRUCTION (ICD-537.0) CIGARETTE SMOKER (ICD-305.1) ACUTE DUODEN ULCER W/HEMORR PERF&OBSTRUCTION (ICD-532.21) BARRETT'S ESOPHAGUS, HX OF (ICD-V12.79) ANXIETY (ICD-300.00) MENOPAUSE (ICD-627.2) GERD (ICD-530.81) ASTHMA (ICD-493.90) chronic abdominal pain with narcotic dependence, sees Dr. Elvin So at [facility]  Past Surgical History: Reviewed history from 09/26/2008 and no changes required. Endoscopy laparoscopic Nissan, vagotomy, and Roux-en-Y gastrojejunostomy per Dr. Daphine Deutscher 02-20-08  Review of Systems  The patient denies anorexia, fever, weight loss, weight gain, vision loss, decreased hearing, hoarseness,  chest pain, syncope, dyspnea on exertion, peripheral edema, prolonged cough, headaches, hemoptysis, abdominal pain, melena, hematochezia, severe indigestion/heartburn, hematuria, incontinence, genital sores, muscle weakness, suspicious skin lesions, transient blindness, difficulty walking, depression, unusual weight change, abnormal bleeding, enlarged lymph nodes, angioedema, breast masses, and testicular masses.    Physical Exam  General:  alert, has a lot of trouble speaking due to tics as below Head:  Normocephalic and atraumatic without obvious abnormalities. No apparent alopecia or balding. Eyes:  No corneal or conjunctival inflammation noted. EOMI. Perrla. Funduscopic exam benign, without hemorrhages, exudates or papilledema. Vision grossly normal. Ears:  External ear exam shows no significant lesions or deformities.  Otoscopic examination reveals clear canals, tympanic membranes are intact bilaterally without bulging, retraction, inflammation or discharge. Hearing is grossly normal bilaterally. Nose:  External nasal examination shows no deformity or inflammation. Nasal mucosa are pink and moist without lesions or exudates. Mouth:  Oral mucosa and oropharynx without lesions or exudates.  Teeth in good repair. Neck:  No deformities, masses, or tenderness noted. Lungs:  Normal respiratory effort, chest expands symmetrically. Lungs are clear to auscultation, no crackles or wheezes. Heart:  Normal rate and regular rhythm. S1 and S2 normal without gallop, murmur, click, rub or other extra sounds. Neurologic:  alert & oriented X3 and gait normal.  Has frequent twitches and jerks of the facial muscles, the mouth, the throat, and the neck.  Psych:  Oriented X3, good eye contact, and moderately anxious.     Impression & Recommendations:  Problem # 1:  TARDIVE DYSKINESIA (ICD-333.85)  Orders: Benadryl  IM or IV (  J1200) Admin of Therapeutic Inj  intramuscular or subcutaneous (42595)  Problem # 2:   PEPTIC ULCER DISEASE (ICD-533.90)  Her updated medication list for this problem includes:    Carafate 1 Gm/81ml Susp (Sucralfate) .Marland Kitchen... Take 10 ml (2 teaspoons) by mouth four times per day    Prilosec 40 Mg Cpdr (Omeprazole) ..... Once daily  Problem # 3:  GASTRIC OUTLET OBSTRUCTION (ICD-537.0)  Problem # 4:  ANXIETY (ICD-300.00)  Her updated medication list for this problem includes:    Prozac 40 Mg Caps (Fluoxetine hcl) .Marland Kitchen... 2 tabs once daily    Alprazolam 1 Mg Tabs (Alprazolam) .Marland KitchenMarland KitchenMarland KitchenMarland Kitchen 4 times a day  Complete Medication List: 1)  Promethazine Hcl 25 Mg Tabs (Promethazine hcl) .Marland Kitchen.. 1 q 6 hours as needed nausea 2)  Carafate 1 Gm/7ml Susp (Sucralfate) .... Take 10 ml (2 teaspoons) by mouth four times per day 3)  Prozac 40 Mg Caps (Fluoxetine hcl) .... 2 tabs once daily 4)  Alprazolam 1 Mg Tabs (Alprazolam) .... 4 times a day 5)  Suboxone 2-0.5 Mg Subl (Buprenorphine hcl-naloxone hcl) .... As needed 6)  Nicotrol 10 Mg Inha (Nicotine) .... Use daily as needed 7)  Prilosec 40 Mg Cpdr (Omeprazole) .... Once daily 8)  Oxycodone Hcl 5 Mg Tabs (Oxycodone hcl) .Marland Kitchen.. 1 three times a day  Patient Instructions: 1)  Stop Seroquel. Given an injection of Benadryl today. She will use 50 mg of Benadryl every 4 hours at home until this subsides. Call me next week with an update. Go to the ER if this gets worse. Prescriptions: ALPRAZOLAM 1 MG TABS (ALPRAZOLAM) 4 times a day  #120 x 5   Entered and Authorized by:   Nelwyn Salisbury MD   Signed by:   Nelwyn Salisbury MD on 11/17/2009   Method used:   Print then Give to Patient   RxID:   [id number] PROZAC 40 MG CAPS (FLUOXETINE HCL) 2 tabs once daily  #60 x 11   Entered and Authorized by:   Nelwyn Salisbury MD   Signed by:   Nelwyn Salisbury MD on 11/17/2009   Method used:   Print then Give to Patient   RxID:   [id number]    Medication Administration  Injection # 1:    Medication: Benadryl  IM or IV    Diagnosis: TARDIVE DYSKINESIA  (ICD-333.85)    Route: IM    Site: L deltoid    Exp Date: 2/12    Lot #: [id number]    Mfr: baxter    Comments: 50mg  given    Patient tolerated injection without complications    Given by: Raechel Ache, RN (Nov 17, 2009 11:38 AM)  Orders Added: 1)  Est. Patient Level IV [02542] 2)  Benadryl  IM or IV [J1200] 3)  Admin of Therapeutic Inj  intramuscular or subcutaneous [70623]

## 2010-07-24 NOTE — Progress Notes (Signed)
Summary: shaking and stuttering  Phone Note Call from Patient   Caller: Patient Call For: Nelwyn Salisbury MD Summary of Call: Pt is taking Hydrocodone and is shaking and stuttering again x 10 days.  Is asking what to do? [phone] Initial call taken by: Lynann Beaver CMA,  December 13, 2009 12:18 PM  Follow-up for Phone Call        take benadryl 50 mg q 4 hours as needed . if not better go to ER Follow-up by: Nelwyn Salisbury MD,  December 13, 2009 1:58 PM  Additional Follow-up for Phone Call Additional follow up Details #1::        Pt. notifed. Additional Follow-up by: Lynann Beaver CMA,  December 13, 2009 2:04 PM

## 2010-07-24 NOTE — Progress Notes (Signed)
Summary: Needs Mental Evaluation  Phone Note Call from Patient Call back at Home Phone [phone]   Caller: Patient Summary of Call: I Have been going to pain management center for a year.  Urine was tested and showed cocaine and methodone. I do not take these drugs.  Physician advised me to get a mental evaluation before he will prescribe me any additional pain meds.  I have called everyone in [place] and no one will accept my insurance (Medicare).  Does Dr. Clent Ridges know of any place I can go to get a mental evaluation that will accept my insurance? Initial call taken by: Trixie Dredge,  January 10, 2010 4:17 PM  Follow-up for Phone Call        No but we will find someone who does. 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

## 2010-07-24 NOTE — Progress Notes (Signed)
Summary: ? med?  Phone Note Call from Patient   Caller: Patient Call For: Nelwyn Salisbury MD Summary of Call: [facility]   Could not understand her previous voice mail. Initial call taken by: Lynann Beaver CMA,  October 17, 2009 11:29 AM  Follow-up for Phone Call        Pt wants to go back on Seroquel.   Only stopped due to insurance issues, but states Medicare will pay for some of it now. [place]. Follow-up by: Lynann Beaver CMA,  October 17, 2009 12:05 PM  Additional Follow-up for Phone Call Additional follow up Details #1::        call in Seroquel 50 mg at bedtime , #30 with 5 rf Additional Follow-up by: Nelwyn Salisbury MD,  October 17, 2009 5:20 PM    New/Updated Medications: SEROQUEL 50 MG TABS (QUETIAPINE FUMARATE) 1 at bedtime Prescriptions: SEROQUEL 50 MG TABS (QUETIAPINE FUMARATE) 1 at bedtime  #30 x 5   Entered by:   Raechel Ache, RN   Authorized by:   Nelwyn Salisbury MD   Signed by:   Raechel Ache, RN on 10/17/2009   Method used:   Electronically to        [facility]* (retail)       [address]       [place], [place]  [id number]       Ph: [phone]       Fax: [phone]   RxID:   [id number]

## 2010-07-24 NOTE — Consult Note (Signed)
Summary: ENT-Dr. Narda Bonds  ENT-Dr. Narda Bonds   Imported By: Maryln Gottron 09/12/2009 09:24:24  _____________________________________________________________________  External Attachment:    Type:   Image     Comment:   External Document

## 2010-07-24 NOTE — Progress Notes (Signed)
Summary: weight loss  Phone Note Call from Patient Call back at Home Phone [phone]   Caller: Patient Call For: Nelwyn Salisbury MD Summary of Call: says she needs some help- weight is 100#- can't eat and wants to go to the hospital. Initial call taken by: Raechel Ache, RN,  August 24, 2009 11:47 AM  Follow-up for Phone Call        I cannot help her. I suggest she talk to Dr. Randa Evens, her GI doctor. If it is an emergency, go to the ER.  Follow-up by: Nelwyn Salisbury MD,  August 24, 2009 12:08 PM  Additional Follow-up for Phone Call Additional follow up Details #1::        Pt. notified. Additional Follow-up by: Lynann Beaver CMA,  August 24, 2009 12:27 PM

## 2010-07-24 NOTE — Letter (Signed)
Summary: [facility]  [facility]   Imported By: Maryln Gottron 12/11/2009 11:25:02  _____________________________________________________________________  External Attachment:    Type:   Image     Comment:   External Document

## 2010-07-24 NOTE — Progress Notes (Signed)
Summary: memory bad  Phone Note Call from Patient Call back at Home Phone [phone]   Caller: vm Summary of Call: Stuttering has passed, but memory real bad.  Is this due to coming off Seroquel? Initial call taken by: Rudy Jew, RN,  November 24, 2009 11:17 AM  Follow-up for Phone Call        yes I think so. Give it a few weeks to pass Follow-up by: Nelwyn Salisbury MD,  November 24, 2009 1:02 PM  Additional Follow-up for Phone Call Additional follow up Details #1::        Phone Call Completed Additional Follow-up by: Rudy Jew, RN,  November 24, 2009 2:18 PM

## 2010-07-26 NOTE — Letter (Signed)
Summary: [facility]  [facility]   Imported By: Maryln Gottron 07/12/2010 10:27:05  _____________________________________________________________________  External Attachment:    Type:   Image     Comment:   External Document

## 2010-07-26 NOTE — Assessment & Plan Note (Signed)
Summary: MEDICATION CONCERNS // RS   Vital Signs:  Patient profile:   60 year old female Weight:      118 pounds Temp:     99.2 degrees F oral Pulse rhythm:   regular BP sitting:   92 / 60  Vitals Entered By: Lynann Beaver CMA AAMA (June 06, 2010 10:40 AM) CC: probs with meds Is Patient Diabetic? No Pain Assessment Patient in pain? yes        History of Present Illness: Here with her friend to ask advice about her chronic abdominal pain, her depression, and her anxiety. She has been going to the [facility] for over a year now, and she is very unhappy with her care there. She has been doing reasonably well on her curent regimen of 4 hydrocodone tablets a day, and she asks if I  would be willing to take over prescribing this again. She tried the Depakote I  had given her a few months ago, and this worked very well for her. She would like to stay on this if possible. Her friend backs this up, saying that [name] has been doing much better lately than she has for the past 2 years.   Allergies: 1)  ! Morphine 2)  ! * Seroquel 3)  Chantix (Varenicline Tartrate) 4)  Xanax (Alprazolam)  Past History:  Past Medical History: Reviewed history from 08/11/2009 and no changes required.  Peptic ulcer disease, sees Dr. Randa Evens gastric outlet obstruction gastroparesis GASTRIC OUTLET OBSTRUCTION (ICD-537.0) CIGARETTE SMOKER (ICD-305.1) ACUTE DUODEN ULCER W/HEMORR PERF&OBSTRUCTION (ICD-532.21) BARRETT'S ESOPHAGUS, HX OF (ICD-V12.79) ANXIETY (ICD-300.00) MENOPAUSE (ICD-627.2) GERD (ICD-530.81) ASTHMA (ICD-493.90) chronic abdominal pain with narcotic dependence, sees Dr. Elvin So at [facility]  Past Surgical History: Reviewed history from 04/16/2010 and no changes required. multiple EGDs, last on 12-29-09 per Dr. Lynnell Grain at [facility], several gastric ulcers  laparoscopic Nissan, vagotomy, and Roux-en-Y gastrojejunostomy per Dr. Daphine Deutscher  02-20-08  Review of Systems  The patient denies anorexia, fever, weight loss, weight gain, vision loss, decreased hearing, hoarseness, chest pain, syncope, dyspnea on exertion, peripheral edema, prolonged cough, headaches, hemoptysis, melena, hematochezia, severe indigestion/heartburn, hematuria, incontinence, genital sores, muscle weakness, suspicious skin lesions, transient blindness, difficulty walking, unusual weight change, abnormal bleeding, enlarged lymph nodes, angioedema, breast masses, and testicular masses.    Physical Exam  General:  Well-developed,well-nourished,in no acute distress; alert,appropriate and cooperative throughout examination Abdomen:  soft, normal bowel sounds, no distention, no masses, no guarding, no rigidity, no rebound tenderness, no abdominal hernia, no inguinal hernia, no hepatomegaly, and no splenomegaly.  Diffusely tender Psych:  Oriented X3, memory intact for recent and remote, normally interactive, good eye contact, and depressed affect.     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 Take 10 ml (2 teaspoons) by mouth four times per day    Prilosec 40 Mg Cpdr (Omeprazole) ..... Once daily  Problem # 5:  ANXIETY (ICD-300.00)  Her updated medication list for this problem includes:    Prozac 40 Mg Caps (Fluoxetine hcl) .Marland Kitchen... 2 tabs once daily  Alprazolam 2 Mg Tabs (Alprazolam) ..... One tab 4 times a  day  Complete Medication List: 1)  Promethazine Hcl 25 Mg Tabs (Promethazine hcl) .Marland Kitchen.. 1 q 6 hours as needed nausea 2)  Carafate 1 Gm/59ml Susp (Sucralfate) .... Take 10 ml (2 teaspoons) by mouth four times per day 3)  Prozac 40 Mg Caps (Fluoxetine hcl) .... 2 tabs once daily 4)  Prilosec 40 Mg Cpdr (Omeprazole) .... Once daily 5)  Feosol 200 (65 Fe) Mg Tabs (Ferrous sulfate dried) 6)  Hydrocodone-acetaminophen 10-325 Mg Tabs (Hydrocodone-acetaminophen) .Marland Kitchen.. 1 q 6 hours 7)  Alprazolam 2 Mg Tabs (Alprazolam) .... One tab 4 times a day 8)  Zolpidem Tartrate 10 Mg Tabs (Zolpidem tartrate) .... At bedtime 9)  Depakote 250 Mg Tbec (Divalproex sodium) .... At bedtime  Patient Instructions: 1)  I told them I would take over prescribing all her meds again, but she cannot see the [facility] at all from today forward, and she understands. She can only get medication from me, and she understands.  2)  Please schedule a follow-up appointment in 1 month.  Prescriptions: DEPAKOTE 250 MG TBEC (DIVALPROEX SODIUM) at bedtime  #30 x 5   Entered and Authorized by:   Nelwyn Salisbury MD   Signed by:   Nelwyn Salisbury MD on 06/06/2010   Method used:   Print then Give to Patient   RxID:   [id number] HYDROCODONE-ACETAMINOPHEN 10-325 MG TABS (HYDROCODONE-ACETAMINOPHEN) 1 q 6 hours  #120 x 0   Entered and Authorized by:   Nelwyn Salisbury MD   Signed by:   Nelwyn Salisbury MD on 06/06/2010   Method used:   Print then Give to Patient   RxID:   [id number]    Orders Added: 1)  Est. Patient Level IV [84696] 2)  Venipuncture [29528] 3)  TLB-BMP (Basic Metabolic Panel-BMET) [80048-METABOL] 4)  TLB-CBC Platelet - w/Differential [85025-CBCD] 5)  TLB-Hepatic/Liver Function Pnl [80076-HEPATIC] 6)  TLB-TSH (Thyroid Stimulating Hormone) [84443-TSH] 7)  TLB-Ammonia [82140-AMON]

## 2010-07-26 NOTE — Progress Notes (Signed)
Summary: rx for norco   Phone Note Call from Patient Call back at Home Phone [phone]   Caller: Patient Call For: Nelwyn Salisbury MD Summary of Call: Pt states pharmacy has faxed over multiple requests for her pain meds.   Would like to speak to Almira Coaster to find out what is going on? Initial call taken by: [facility] CMA AAMA,  July 05, 2010 9:08 AM  Follow-up for Phone Call        mess left on pt cell phone we have only received 1 request and dr Deundra Furber would repond today.   Follow-up by: Pura Spice, RN,  July 05, 2010 12:10 PM

## 2010-08-01 ENCOUNTER — Other Ambulatory Visit: Payer: Self-pay | Admitting: Family Medicine

## 2010-08-01 DIAGNOSIS — R52 Pain, unspecified: Secondary | ICD-10-CM

## 2010-08-02 ENCOUNTER — Other Ambulatory Visit: Payer: Self-pay

## 2010-08-02 DIAGNOSIS — R52 Pain, unspecified: Secondary | ICD-10-CM

## 2010-08-02 MED ORDER — HYDROCODONE-ACETAMINOPHEN 10-325 MG PO TABS: 1.0000 | ORAL_TABLET | Freq: Four times a day (QID) | ORAL | Status: DC | PRN

## 2010-08-02 NOTE — Telephone Encounter (Signed)
#  60 w/5 refills on her norco called to [facility]

## 2010-08-02 NOTE — Telephone Encounter (Signed)
Olegario Messier from [facility] called and stated pt usually get 120 quanity. [facility] called and #120 given as per chart pt been getting 120

## 2010-08-02 NOTE — Telephone Encounter (Signed)
Call in 6 months

## 2010-08-07 ENCOUNTER — Inpatient Hospital Stay (HOSPITAL_COMMUNITY)
Admission: EM | Admit: 2010-08-07 | Discharge: 2010-08-14 | DRG: 481 | Disposition: A | Discharge status: Skilled Nursing Facility | Payer: MEDICARE | Attending: Orthopedic Surgery | Admitting: Orthopedic Surgery

## 2010-08-07 ENCOUNTER — Emergency Department (HOSPITAL_COMMUNITY): Payer: MEDICARE

## 2010-08-07 DIAGNOSIS — Y92009 Unspecified place in unspecified non-institutional (private) residence as the place of occurrence of the external cause: Secondary | ICD-10-CM

## 2010-08-07 DIAGNOSIS — F1120 Opioid dependence, uncomplicated: Secondary | ICD-10-CM | POA: Diagnosis present

## 2010-08-07 DIAGNOSIS — F19939 Other psychoactive substance use, unspecified with withdrawal, unspecified: Secondary | ICD-10-CM | POA: Diagnosis not present

## 2010-08-07 DIAGNOSIS — S72033A Displaced midcervical fracture of unspecified femur, initial encounter for closed fracture: Principal | ICD-10-CM | POA: Diagnosis present

## 2010-08-07 DIAGNOSIS — K219 Gastro-esophageal reflux disease without esophagitis: Secondary | ICD-10-CM | POA: Diagnosis present

## 2010-08-07 DIAGNOSIS — W010XXA Fall on same level from slipping, tripping and stumbling without subsequent striking against object, initial encounter: Secondary | ICD-10-CM | POA: Diagnosis present

## 2010-08-07 DIAGNOSIS — K3184 Gastroparesis: Secondary | ICD-10-CM | POA: Diagnosis present

## 2010-08-07 DIAGNOSIS — F17200 Nicotine dependence, unspecified, uncomplicated: Secondary | ICD-10-CM | POA: Diagnosis present

## 2010-08-07 DIAGNOSIS — D62 Acute posthemorrhagic anemia: Secondary | ICD-10-CM | POA: Diagnosis not present

## 2010-08-07 DIAGNOSIS — F341 Dysthymic disorder: Secondary | ICD-10-CM | POA: Diagnosis present

## 2010-08-07 LAB — DIFFERENTIAL
Basophils Absolute: 0.1 10*3/uL (ref 0.0–0.1)
Lymphocytes Relative: 5 % — ABNORMAL LOW (ref 12–46)
Monocytes Absolute: 1.2 10*3/uL — ABNORMAL HIGH (ref 0.1–1.0)
Monocytes Relative: 6 % (ref 3–12)
Neutro Abs: 17.6 10*3/uL — ABNORMAL HIGH (ref 1.7–7.7)

## 2010-08-07 LAB — CBC
HCT: 35 % — ABNORMAL LOW (ref 36.0–46.0)
Hemoglobin: 11.6 g/dL — ABNORMAL LOW (ref 12.0–15.0)
MCHC: 33.1 g/dL (ref 30.0–36.0)

## 2010-08-07 LAB — BASIC METABOLIC PANEL
BUN: 17 mg/dL (ref 6–23)
Chloride: 102 mEq/L (ref 96–112)
GFR calc Af Amer: >60 mL/min (ref >60)
GFR calc non Af Amer: >60 mL/min (ref >60)
Potassium: 3.7 mEq/L (ref 3.5–5.1)
Sodium: 134 mEq/L — ABNORMAL LOW (ref 135–145)

## 2010-08-07 LAB — URINALYSIS, ROUTINE W REFLEX MICROSCOPIC
Ketones, ur: NEGATIVE mg/dL
Nitrite: NEGATIVE
Specific Gravity, Urine: 1.02 (ref 1.005–1.030)
Urine Glucose, Fasting: NEGATIVE mg/dL
pH: 5 (ref 5.0–8.0)

## 2010-08-07 LAB — PROTIME-INR: Prothrombin Time: 13.9 seconds (ref 11.6–15.2)

## 2010-08-08 ENCOUNTER — Inpatient Hospital Stay (HOSPITAL_COMMUNITY): Payer: MEDICARE

## 2010-08-09 LAB — URINE CULTURE
Colony Count: NO GROWTH
Culture: NO GROWTH

## 2010-08-09 LAB — CBC
MCH: 30.2 pg (ref 26.0–34.0)
MCHC: 32.8 g/dL (ref 30.0–36.0)
Platelets: 175 10*3/uL (ref 150–400)
RBC: 2.65 MIL/uL — ABNORMAL LOW (ref 3.87–5.11)

## 2010-08-09 LAB — PROTIME-INR: Prothrombin Time: 14.2 seconds (ref 11.6–15.2)

## 2010-08-09 LAB — BASIC METABOLIC PANEL
BUN: 8 mg/dL (ref 6–23)
Calcium: 7.8 mg/dL — ABNORMAL LOW (ref 8.4–10.5)
GFR calc non Af Amer: >60 mL/min (ref >60)
Glucose, Bld: 134 mg/dL — ABNORMAL HIGH (ref 70–99)

## 2010-08-10 DIAGNOSIS — S72009A Fracture of unspecified part of neck of unspecified femur, initial encounter for closed fracture: Secondary | ICD-10-CM

## 2010-08-10 LAB — CROSSMATCH
ABO/RH(D): A POS
Unit division: 0

## 2010-08-10 LAB — BASIC METABOLIC PANEL
CO2: 25 mEq/L (ref 19–32)
Calcium: 8.1 mg/dL — ABNORMAL LOW (ref 8.4–10.5)
Creatinine, Ser: 0.52 mg/dL (ref 0.4–1.2)
GFR calc Af Amer: >60 mL/min (ref >60)

## 2010-08-10 LAB — CBC
MCH: 29.9 pg (ref 26.0–34.0)
MCHC: 33.1 g/dL (ref 30.0–36.0)
Platelets: 168 10*3/uL (ref 150–400)
RDW: 14.4 % (ref 11.5–15.5)

## 2010-08-10 LAB — PROTIME-INR
INR: 1.75 — ABNORMAL HIGH (ref 0.00–1.49)
Prothrombin Time: 20.6 seconds — ABNORMAL HIGH (ref 11.6–15.2)

## 2010-08-11 ENCOUNTER — Other Ambulatory Visit: Payer: Self-pay | Admitting: Family Medicine

## 2010-08-11 DIAGNOSIS — F0630 Mood disorder due to known physiological condition, unspecified: Secondary | ICD-10-CM

## 2010-08-11 DIAGNOSIS — T424X5A Adverse effect of benzodiazepines, initial encounter: Secondary | ICD-10-CM

## 2010-08-11 LAB — PROTIME-INR: INR: 1.88 — ABNORMAL HIGH (ref 0.00–1.49)

## 2010-08-11 LAB — BASIC METABOLIC PANEL
BUN: 4 mg/dL — ABNORMAL LOW (ref 6–23)
Chloride: 104 mEq/L (ref 96–112)
Creatinine, Ser: 0.52 mg/dL (ref 0.4–1.2)

## 2010-08-11 LAB — CBC
HCT: 31 % — ABNORMAL LOW (ref 36.0–46.0)
MCHC: 32.3 g/dL (ref 30.0–36.0)
RDW: 14.1 % (ref 11.5–15.5)

## 2010-08-12 LAB — PROTIME-INR: Prothrombin Time: 24.1 seconds — ABNORMAL HIGH (ref 11.6–15.2)

## 2010-08-12 NOTE — Consult Note (Signed)
NAMEANTIONETTE, LUSTER NO.:  [id number]  MEDICAL RECORD NO.:  [id number]           PATIENT TYPE:  I  LOCATION:  1601                         FACILITY:  [facility]  PHYSICIAN:  Marlis Edelson, DO        DATE OF BIRTH:  09/18/1950  DATE OF CONSULTATION:  08/11/2010 DATE OF DISCHARGE:                                CONSULTATION   Psychiatric Consultation  REFERRING PHYSICIAN:  Eulas Post, M.D.  REASON FOR CONSULTATION:  Possible benzodiazepine withdrawal.  HISTORY OF THE CURRENT ILLNESS:  [name] is a 60 year old Caucasian female admitted to the [facility] following a fall with a resultant fracture of her left hip.  She has undergone open reduction and internal fixation of that hip fracture.  Following the operative period, she has displayed behaviors consistent with benzodiazepine withdrawal.  She is currently receiving narcotics (postoperatively) for pain and has been placed on Valium to help with muscle spasm and augmentation of her pain medications.  On the current dose of Valium (5 mg q.6h.), the withdrawal symptoms have cleared.  She originally was showing some periods consistent with withdrawal or mild delirium that are not apparent at present.  She was clear, coherent and oriented at the time of assessment.  [name] has related a history of falls.  She relates some of these to recent changes at the home that include the use of laminated floors after a period of time of having carpets.  She was not relating other reasons for falls such as dizziness or other medical symptoms.  She has had a longstanding history of benzodiazepine use currently taking Xanax 2 mg four times a day.  She has had a history of previous hospitalizations because of opiate dependence, one of which she outlined the fact that she would run out of her Xanax before the end of the month.  There has been a suspicion of benzodiazepine abuse or dependence along  with her opiate dependence.  She relates that she has been nervous, and uses anxiolytic medications.  She feels that they keep her calm because of excessive stress.  She also stated, "I am OCD big time."  She described herself as "hyper".  She will clean excessively, talk excessively, cook excessively or do any other behavior in excess.  She likes to keep her home neat and clean.  She is obsessive about things and states that she handles those thoughts by repetitively telling herself that "you can wait."  She is currently living with a 106 year old roommate and feels that she must be a little more accepting of other individuals in her home who may not meet her standards.  She reports being on Prozac so, "I do not cry all the time."  She reports having a history of depression following the development of gastric ulcers and chronic abdominal pain following a number of surgeries because of perforated ulcers.  She had been diagnosed by Dr. Tomasa Rand, a local psychiatrist with bipolar disorder in the past.  She stated later she saw a counselor who told her that that she did not have it.  She  was placed on Depakote. She does not remember the particular side-effects, but knows on large doses that she did not tolerate the medicine well.  She did report stability on low doses of Depakote.  That medication apparently was not continued and she is not receiving current psychiatric care.  [name] does report behaviors that are consistent with bipolar disorder including excessive spending.  At times she will come home with objects that she does not need. She had a recent episode in which she lost her teeth. She described this as a period of time in which she "blacked out."  PAST PSYCHIATRIC HISTORY:  Opiate dependence; anxiety disorder-NOS; mood disorder-NOS.  She has no history of suicidal attempts and no history of self-mutilation.  She was admitted to the [facility] for  detoxification from September 13, 2008 to September 16, 2008.  Records from that time also indicate that her Xanax runs out before the end of the month.  Her other admission to behavioral hospital was from June 07, 2007 through June 11, 2007 at which time she had questionable delirium after starting Chantix.  She was also placed on a clonidine detox protocol because of excessive opiate use.  PAST MEDICAL HISTORY: 1. Gastroparesis, recurrent, gastric ulcers and perforated ulcers     resulting to numerous endoscopies and abdominal surgeries. 2. She had suffered chronic abdominal pain, bronchitis due to     excessive tobacco use. 3. Asthma. 4. Tonsillectomy. 5. She relates she is currently being followed for "a spot on her     lungs and a spot on her thyroid."  6. Osteoporosis.  ALLERGIES:  MORPHINE (itching)  CURRENT OUTPATIENT MEDICATIONS: 1. Prozac 40 mg daily. 2. Iron supplementation. 3. Ambien 5 mg q.h.s. 4. Xanax 2 mg q.i.d. p.r.n., which she uses on a consistent q.i.d.     basis. 5. Promethazine 25 mg q.4-6h. p.r.n. 6. Hydrocodone APAP 10/325 mg four tablets daily as needed. 7. Prilosec 40 mg twice per day. 8. Calcium and vitamin D supplementation.  SOCIAL HISTORY:  The patient currently lives in a condo in the [place] area and has a new roommate who is 64 years of age.  She states she has been on disability for the last 2 years because of her abdominal problem.s  She has 2 dogs.  Education: Bachelors degree in health and PE.  She has been married twice, has no children.  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  No history of Financial planner.  No history of legal entanglements.  No religious preference.  When asked about abuse, she states that she was bothered by her parents her entire life for not meeting their expectations.  FAMILY HISTORY:  Grandmother, COPD.  Mother is living at age 62.  She has had a previous history of renal carcinoma, arteriosclerotic  coronary artery disease with stent placement.  Her father died at the age of 66. He had bladder cancer, renal cancer and eventually succumbed to pancreatic cancer.  Family history of mental illness includes a parental side uncle, who suffered from schizophrenia.  No other known mental illnesses among family members.  SUBSTANCE ABUSE HISTORY:  When asked how much she smokes, she states "a lot", at least three packs of cigarettes per day.  She also reports drinking coke "a lot" at 4-5 16-ounce bottles per day.  When asked about alcohol consumption, she states she drank at parties in her 48s.  She denies any illicit drug use.  MENTAL STATUS EXAMINATION:  The patient was seen in her hospital room where she was dressed in a hospital gown and have an IV infusing.  She was pleasant and cooperative and established appropriate eye contact. She appears older than her stated age.  She was wearing a necklace and ear rings.  She sat up in bed to talk to the examiner.  Her speech was clear, coherent, regular in volume and tone.  She relates her mood as "depressed a long time".  Her affect was discongruent in that the patient displayed a full range affect during the course of the interview.  Her thought process was linear, logical and goal-directed.  Thought content: She denies perceptual symptoms, ideas of reference, delusions or paranoia.  She has no current suicidal or homicidal ideation.  Judgment is grossly intact.  Insight, a bit shallow.  Psychomotor activity was within normal limits.  ASSESSMENT:  AXIS I: 1. Opiate dependence. 2. Benzodiazepine dependence. 3. Nicotine dependence. 4. Mood disorder (primary versus secondary to medical illnesses,     cannot exclude bipolar disorder based on the current assessment) 5. History of anxiety disorder-not otherwise specified. AXIS II:  Deferred. AXIS III:  Per past medical history.  RECOMMENDATIONS:  [name] is currently on Valium 5 mg q.i.d.  for augmentation of her opiate use and muscle spasms following hip surgery.  I recommend the continuation of the Valium over the next couple of weeks as would be done normally in a patient having undergone hip surgery.  I have talked to Dr. Dion Saucier about decreasing the Valium with a taper of 5 mg per week, decreasing it until it is eventually discontinued.   I do not recommend that the Xanax be continued.  She is currently not in Xanax withdrawal and has had no resultant seizures following the implementation of the benzodiazepines/alcohol withdrawal protocol.  Given her history of opiate dependence, we must apply extreme caution with the use of these medications.  She complains of chronic pain and will need to be followed closely.  If opiates are continued long-term then that management needs to be done in a supervised setting such as a pain management clinic.  I do not recommend the unsupervised administration of benzodiazepines and opiates in this patient as she is a high risk for accidental overdose.  I will obtain a social work consultation to help arrange psychiatric followup.    [name] will need psychiatric followup for further evaluation.    On talking with Dr. Dion Saucier it appears that [name] will need a period of inpatient rehabilitation.  This is fortuitous in that it will allow the appropriate management of her pain, discontinuation of her benzodiazepines in a slow, cautious manner and the opportunity to set her up with appropriate psychiatric followup and management.  Dr. Dion Saucier has done a very good job in speaking with the patient about smoking cessation that is imperative given her lung disease and comorbid medical conditions.  Thank you for allowing me to participate in [name]'s care.          ______________________________ Marlis Edelson, DO     DB/MEDQ  D:  08/11/2010  T:  08/11/2010  Job:  [id number]  Electronically Signed by Marlis Edelson MD on  08/12/2010 08:06:36 PM

## 2010-08-13 NOTE — Discharge Summary (Signed)
[name], [name]                ACCOUNT NO.:  [id number]  MEDICAL RECORD NO.:  [id number]           PATIENT TYPE:  I  LOCATION:  1601                         FACILITY:  [facility]  PHYSICIAN:  Eulas Post, MD    DATE OF BIRTH:  03/28/1951  DATE OF ADMISSION:  08/07/2010 DATE OF DISCHARGE:  08/13/2010                        DISCHARGE SUMMARY - REFERRING   PRIMARY CARE PHYSICIAN:  Tera Mater. Clent Ridges, M.D.  ADMISSION DIAGNOSIS:  Left hip fracture.  DISCHARGE DIAGNOSES: 1. Left hip fracture. 2. Acute blood loss anemia expected. 3. Benzodiazepine withdrawal with delirium.  CONSULTING PHYSICIANS:  Marlis Edelson, D.O. with Psychiatry.  DISCHARGE DISPOSITION:  Skilled nursing facility.  DISCHARGE INSTRUCTIONS:  Weightbearing as tolerated bilateral lower extremities, change dressings every 2-3 days as needed, monitor for benzodiazepine withdrawal and delirium, and keep her wounds clean and dry.  HOSPITAL COURSE:  [name] is a 60 year old woman who has a history of narcotic dependence and benzodiazepine use who has been through rehab twice and has been at home and apparently had a fall and broke her left hip.  She underwent left hip dynamic hip screw placement. She tolerated the procedure well and was given perioperative antibiotics for antimicrobial prophylaxis.  She was also given what she reported as her home regimen and prescribed medications which did include benzodiazepines.  This quantity, based on her reported amount that she took, which did include Ativan 2 mg 4 times a day, was not sufficient to keep her from going into severe benzodiazepine withdrawal.  She was on a PCA Dilaudid as well as the benzodiazepines and subsequently developed symptoms of withdrawal.  Psychiatry consultation was requested, and she was also placed on CIWA protocol.  After substantially increasing her benzodiazepine dosing, she finally came out of withdrawal and normalized from mental status  standpoint.  She was improving and making slow progress with physical therapy.  She is now normal from her mental status standpoint.  Her hemoglobin did drop, and I did give her 2 units transfusion of packed red blood cells.  She was given perioperative antibiotics for antimicrobial prophylaxis.  She also did have difficulties with her Foley, and during her delirium, she ripped it out twice.  She did have urinary retention, at one point had an in-and-out cath for 1000 mL.  Ultimately, she was able to void independently.  She was given sequential compression devices as well as Lovenox and Coumadin for DVT prophylaxis.  Her INR was 2.4 on August 12, 2010.  After discussion with Dr. Huston Foley, I have written for a benzodiazepine taper, and I have discontinued her Ambien as well as her Ativan and her Xanax and placed her purely on diazepam 5 mg p.o. q.i.d. for a period of 2 weeks, and then she will go to t.i.d. for a period of 1 week and then to b.i.d. for a period of 1 week and then to daily for a period of 1 week, at which point she will discontinue it all together.  I have also written her for Percocet 5/325 for pain given her postoperative status. She was also given a NicoDerm patch for her severe  tobacco abuse.  All of these risk factors increase the risk that she may have substantial complications with her hip fracture, including nonunion as well as periprosthetic fracture, not to mention the health risks from the benzodiazepine withdrawal.  She is being discharged to skilled nursing facility and will follow up with me in 2 weeks and also with Dr. Huston Foley, according to his instructions.  She benefited maximally from her hospital stay.     Eulas Post, MD     JPL/MEDQ  D:  08/12/2010  T:  08/12/2010  Job:  [id number]  cc:   Tera Mater. Clent Ridges, MD [address] [place] [address]  Electronically Signed by Teryl Lucy MD on 08/13/2010 10:09:25 AM

## 2010-08-13 NOTE — Telephone Encounter (Signed)
No , I gave her #120 with 5 rf ( a 6 month supply) on 04-16-10

## 2010-08-13 NOTE — Op Note (Signed)
NAMEALDINA, PORTA                ACCOUNT NO.:  [id number]  MEDICAL RECORD NO.:  [id number]           PATIENT TYPE:  I  LOCATION:  1608                         FACILITY:  [facility]  PHYSICIAN:  Eulas Post, MD    DATE OF BIRTH:  [birth year]  DATE OF PROCEDURE:  08/08/2010 DATE OF DISCHARGE:                              OPERATIVE REPORT   SURGEON:  Eulas Post, MD  PREOPERATIVE DIAGNOSIS:  Left basicervical femoral neck fracture.  POSTOPERATIVE DIAGNOSIS:  Left basicervical femoral neck fracture.  OPERATIVE PROCEDURE:  Left dynamic hip screw placement.  ANESTHESIA:  General.  ESTIMATED BLOOD LOSS:  200 mL.  OPERATIVE IMPLANTS:  Synthes dynamic hip screw with a total of 3 distal screws and they sized 100-mm lag screw.  PREOPERATIVE INDICATIONS:  Mrs. Rosselyn Martha is a 60 year old woman who has a substantial tobacco abuse habit of three packs per day who fell and broke her left hip.  She elected to undergo the above-named procedures.  The risks, benefits and alternatives were discussed before the procedure including, but not limited to risks of infection, bleeding, nerve injury, malunion, nonunion, hardware prominence, hardware failure, need for hardware removal, need for conversion to hip arthroplasty, avascular necrosis, leg-length discrepancy, blood clots, blood transfusion, cardiopulmonary complications, among others and she is willing to proceed.  OPERATIVE PROCEDURE:  The patient was brought to the operating room, placed in supine position.  IV antibiotics were given.  General anesthesia was administered.  The left lower extremity was placed in the fracture table.  The fracture was reduced and held on the fracture table.  After a sterile prep and drape, incision was made over the proximal thigh.  Dissection was carried down through the IT band down through the vastus lateralis and this was reflected anteriorly and deep retractor was placed.  The guidepin was  introduced using the jig. Initially, it took me couple of passes to get into the exact appropriate position, but ultimately has center on the head.  The fracture was reduced slightly valgus.  I measured the pin length and selected the 100- mm blade.  I then drilled and opened the femur and then tapped and then placed the helical blade.  Excellent position was achieved within the head with appropriate tip-apex distance.  The fracture remained in a slightly valgus position.  The bite of the lag screw was such that I had to overturn the lag screw and then turned it back in order to maintain appropriate rotation on the head.  I did also used a Cobb to try to maintain the neck position, but this worked better if I just over- rotated and then turned ahead back, so I rotated it anatomically on the lateral view.  Once this had been achieved, I placed the plate and then secured it distally with bicortical nonlocking screws.  I also applied compression of the screw using the compression nut, which I then removed.  Wounds were then irrigated copiously and the fascia was closed with #1 Vicryl followed by 2-0 for subcutaneous tissue and 4-0 Monocryl with Steri- Strips and sterile gauze.  The patient was awakened,  extubated and returned to the PACU in stable satisfactory condition.  There were no complications and she tolerated the procedure well.  She will be weightbearing as tolerated and I will plan to have on Lovenox, bridging the Coumadin for a period of 1 month postoperatively.  Her tobacco abuse causes substantial risk for perioperative complication as well as the risk for avascular necrosis and nonunion; however, the alternatives is an arthroplasty, which also carries substantial risk especially given her young age.  Hopefully, she will heal this fracture and be able to return to full function.     Eulas Post, MD     JPL/MEDQ  D:  08/08/2010  T:  08/09/2010  Job:   [id number]  Electronically Signed by Teryl Lucy MD on 08/13/2010 10:09:22 AM

## 2010-08-13 NOTE — Telephone Encounter (Signed)
Faxed to [facility] 

## 2010-08-13 NOTE — H&P (Signed)
[name], [name] NO.:  [id number]  MEDICAL RECORD NO.:  [id number]           PATIENT TYPE:  E  LOCATION:  WLED                         FACILITY:  [facility]  PHYSICIAN:  [name] Post, MD    DATE OF BIRTH:  1951-01-07  DATE OF ADMISSION:  08/07/2010 DATE OF DISCHARGE:                             HISTORY & PHYSICAL   PRIMARY CARE PHYSICIAN:  [name] Mater. Clent Ridges, MD  CHIEF COMPLAINT:  Left hip pain.  HISTORY:  [name] is a 60 year old woman who smokes approximately 3 packs per day who is on chronic disability due to gastroparesis and abdominal ulcers who has been complaining of relatively poor balance with multiple falls lately.  She fell about 6 weeks ago and had an injury to her left hand with a hand fracture that has been treated.  She fell tonight and injured her left hip and was unable to walk.  She slipped on a slick floor.  She complained of 10/10 pain around her left hip, and thought "she was going to die."  Movement makes it worse.  Rest and IV pain medications make it better.  She had no loss of consciousness.  She has not had any vision changes.  During the course of our interview, she indicated that she has been seen by Dr. Turner [name] in the past and I offered to call their group which she declined and she wanted me to go ahead and continue her care.  She has a past history of gastroparesis as well as ulcers as indicated above and had to have a blood transfusion at one time for a bleeding ulcer in her stomach.  She smokes approximately 3 packs per day and a tobacco intervention session was performed.  I recommended that she quit smoking in order to help optimize the healing of her hip, which she says that she is not capable of doing and she in fact has requested Nicoderm patches.  This does increase her risk for nonunion as well as complications around surgery but nevertheless she seems to have developed quite an  addiction unfortunately.  FAMILY HISTORY:  Significant for her grandmother who had COPD, and her mother is alive at the age of 24 and apparently in very good health. There is no history of hip fractures in the family.  SOCIAL HISTORY:  She does live alone and is supported by her friend.  REVIEW OF SYSTEMS:  Complete review of systems was performed and was otherwise negative with the exception of those mentioned in the history of present illness.  PHYSICAL EXAMINATION:  GENERAL:  In general, she is somewhat cachectic and frail looking and appears older than stated age. EYE EXAM:  Extraocular movements are intact. LYMPHATIC EXAM:  She has no axillary or cervical lymphadenopathy. CARDIOVASCULAR EXAM:  She has intact dorsalis pedis pulses distally. She has no pedal edema. PULMONARY EXAM:  She has no cyanosis and no increase respiratory efforts. ABDOMINAL EXAM:  Her abdomen is soft, nontender, nondistended with no rebound or guarding.  I do not appreciate any organomegaly. PSYCHIATRIC EXAM:  She is alert and oriented x3 and is  appropriate and competent for consent. SKIN EXAM:  Her left hip has no skin breaks. NEUROLOGIC EXAM:  Sensation is intact throughout the foot on the left side and I do not appreciate any facial droop or other neurologic abnormality. MUSCULOSKELETAL SYSTEM:  Her left hip has a positive log roll and is somewhat shortened.  EHL and FHL are firing.  LABORATORY DATA AND RADIOLOGIC STUDIES:  X-rays:  X-rays of the left hip demonstrate a basicervical femoral neck fracture.  IMPRESSION:  A 60 year old woman with left basicervical intertrochanteric hip fracture, substantial tobacco abuse, and history of abdominal ulcers and bleeding ulcer requiring transfusion.  PLAN:  This is an acute and complicated situation.  She is going to need some type of surgical intervention for her left hip.  There are 2 primary options, hip arthroplasty or alternatively internal fixation.   I have discussed the options with her and she is very young, such that I would hope that if we could get her fracture to heal and avoid avascular necrosis, then she would be better off in the long run.  Nonetheless, her chances of it healing are decreased by her substantial tobacco abuse which I have discussed with her.  After long discussion with her, I have recommended internal fixation in order to try and save her hip, recognizing the risks for malunion, nonunion, hardware prominence, hardware failure, the need to convert to total hip replacement, avascular necrosis, blood clots, cardiopulmonary complications, leg length discrepancy, neurologic injury, blood transfusion among others. Her postoperative management is also complicated by the fact she has had a history of bleeding ulcers in the past which makes me reluctant to recommend Coumadin for DVT prophylaxis, and we will probably use Lovenox, despite the bleeding risk, for a duration of 3 weeks postoperatively.  She will be admitted to the hospital and given a PCA. She will also have a Foley and we will plan to proceed with surgery likely tomorrow.  The risks, benefits and alternatives were discussed as above.     [name] Post, MD     JPL/MEDQ  D:  08/07/2010  T:  08/08/2010  Job:  [id number]  cc:   [name] Mater. Clent Ridges, MD [address] [place] [address]  Electronically Signed by Teryl Lucy MD on 08/13/2010 10:09:20 AM

## 2010-08-14 ENCOUNTER — Other Ambulatory Visit: Payer: Self-pay

## 2010-08-14 LAB — PROTIME-INR
INR: 2.88 — ABNORMAL HIGH (ref 0.00–1.49)
Prothrombin Time: 30.2 seconds — ABNORMAL HIGH (ref 11.6–15.2)

## 2010-08-14 NOTE — Telephone Encounter (Signed)
Received fax from [facility] requesting hydrocodone 10-325 dated 08-11-2010 this had been approved on 08-02-2010 with 5 refills . [facility] today and I spoke with [place]. Kathlene November said pt did get this on 08-02-2010 and they make deliveries to her home. He said #120 that was given on 08-02-2010 was delivered to her home and the delivery person said he did leave it in her door but on Sat patient called and said she never got it delivered and so they refilled another 120 . I asked Kathlene November did the delivery person get a signature showing that she had med and he said "no because we usually deliver to her. He also said that pt apparently went to [facility]  Because the hosp called and questioned her fluoxetine dose.

## 2010-08-15 NOTE — Telephone Encounter (Signed)
noted 

## 2010-08-22 ENCOUNTER — Telehealth: Payer: Self-pay | Admitting: *Deleted

## 2010-08-22 NOTE — Telephone Encounter (Signed)
Fell and fractured hip 2 weeks ago, and has not had any Prozac.  She wants to know if she should go back on it or not??  Took her off of Xanax at Rehab.  Is on Valium right now. [phone]

## 2010-08-23 NOTE — Telephone Encounter (Signed)
I would stay off Prozac and see what happens. If she thinks she needs to go back on it, let me know

## 2010-08-24 NOTE — Telephone Encounter (Signed)
Left message on pt's voice mail to stay off of the Prozac for now and call if she needs to go back on it.

## 2010-08-25 ENCOUNTER — Encounter: Payer: Self-pay | Admitting: Family Medicine

## 2010-08-29 ENCOUNTER — Ambulatory Visit: Payer: MEDICARE | Admitting: Family Medicine

## 2010-09-05 ENCOUNTER — Ambulatory Visit (INDEPENDENT_AMBULATORY_CARE_PROVIDER_SITE_OTHER): Payer: MEDICARE | Admitting: Family Medicine

## 2010-09-05 ENCOUNTER — Encounter: Payer: Self-pay | Admitting: Family Medicine

## 2010-09-05 VITALS — BP 110/60 | HR 92 | Temp 98.8°F | Wt 122.0 lb

## 2010-09-05 DIAGNOSIS — M25559 Pain in unspecified hip: Secondary | ICD-10-CM

## 2010-09-05 DIAGNOSIS — F411 Generalized anxiety disorder: Secondary | ICD-10-CM

## 2010-09-05 DIAGNOSIS — F419 Anxiety disorder, unspecified: Secondary | ICD-10-CM

## 2010-09-05 DIAGNOSIS — F329 Major depressive disorder, single episode, unspecified: Secondary | ICD-10-CM

## 2010-09-05 DIAGNOSIS — F3289 Other specified depressive episodes: Secondary | ICD-10-CM

## 2010-09-05 MED ORDER — DIVALPROEX SODIUM 250 MG PO DR TAB: 250.0000 mg | DELAYED_RELEASE_TABLET | Freq: Every day | ORAL | Status: DC

## 2010-09-05 MED ORDER — DIAZEPAM 5 MG PO TABS: 5.0000 mg | ORAL_TABLET | Freq: Two times a day (BID) | ORAL | Status: DC

## 2010-09-05 NOTE — Progress Notes (Signed)
  Subjective:    Patient ID: [name], female    DOB: [birth year], 60 y.o.   MRN: [id number]  HPI Here to follow up after a hospital stay from 08-07-10 to 08-13-10 to repair a fractured left hip per Dr. Dion Saucier, and then from a rehab stay at the  [facility] facility. During her hospital stay she developed delirium form benzodiazepine withdrawals and Psychiatry was consulted. She was seen by Dr. Marlis Edelson, who stopped all her psychiatric meds except Valium. He instituted a taper down on the Valium, with the idea of having her be off all psychotropic meds in the next few weeks. She is now taking Valium tid. She is very afraid of what will happen if she stops all these meds, and I agree with her. She saw Dr. Dion Saucier for a follow up visit last week, and he is pleased with her progress. She wants to see a Psychiatrist but cannot afford one. Her insurance has no psychiatric coverage.   Review of Systems  Constitutional: Negative.   Musculoskeletal: Positive for arthralgias and gait problem.  Psychiatric/Behavioral: Positive for sleep disturbance and agitation. The patient is nervous/anxious.        Objective:   Physical Exam  Constitutional:       Walking with a walker   Psychiatric: Her behavior is normal. Judgment and thought content normal. Her mood appears anxious.          Assessment & Plan:   She seems to be doing well with her hip surgery. I do not think she can totally stop all anxiety meds, so I told her to taper down to bid Valium next week as planned, but to stay at that dose for the time being. Will try to get her into [facility] mental [facility] clinic.

## 2010-09-28 LAB — DIFFERENTIAL
Eosinophils Absolute: 0.1 10*3/uL (ref 0.0–0.7)
Eosinophils Relative: 1 % (ref 0–5)
Lymphs Abs: 1.8 10*3/uL (ref 0.7–4.0)
Monocytes Relative: 6 % (ref 3–12)
Neutrophils Relative %: 75 % (ref 43–77)

## 2010-09-28 LAB — URINALYSIS, ROUTINE W REFLEX MICROSCOPIC
Bilirubin Urine: NEGATIVE
Glucose, UA: NEGATIVE mg/dL
Hgb urine dipstick: NEGATIVE
Specific Gravity, Urine: 1.01 (ref 1.005–1.030)
Urobilinogen, UA: 0.2 mg/dL (ref 0.0–1.0)

## 2010-09-28 LAB — CBC
HCT: 34.7 % — ABNORMAL LOW (ref 36.0–46.0)
MCV: 83.9 fL (ref 78.0–100.0)
RBC: 4.14 MIL/uL (ref 3.87–5.11)
WBC: 10.9 10*3/uL — ABNORMAL HIGH (ref 4.0–10.5)

## 2010-09-28 LAB — POCT I-STAT, CHEM 8
BUN: 9 mg/dL (ref 6–23)
Calcium, Ion: 1.07 mmol/L — ABNORMAL LOW (ref 1.12–1.32)
HCT: 37 % (ref 36.0–46.0)
Potassium: 4.5 mEq/L (ref 3.5–5.1)
TCO2: 23 mmol/L (ref 0–100)

## 2010-09-28 LAB — URINE MICROSCOPIC-ADD ON

## 2010-09-30 LAB — COMPREHENSIVE METABOLIC PANEL
ALT: 12 U/L (ref 0–35)
AST: 23 U/L (ref 0–37)
Albumin: 3.3 g/dL — ABNORMAL LOW (ref 3.5–5.2)
Alkaline Phosphatase: 72 U/L (ref 39–117)
Chloride: 106 mEq/L (ref 96–112)
GFR calc Af Amer: >60 mL/min (ref >60)
Potassium: 4.2 mEq/L (ref 3.5–5.1)
Sodium: 141 mEq/L (ref 135–145)
Total Protein: 6.7 g/dL (ref 6.0–8.3)

## 2010-09-30 LAB — URINALYSIS, ROUTINE W REFLEX MICROSCOPIC
Ketones, ur: NEGATIVE mg/dL
Nitrite: NEGATIVE
Protein, ur: NEGATIVE mg/dL

## 2010-09-30 LAB — CBC
Platelets: 310 10*3/uL (ref 150–400)
RDW: 18.4 % — ABNORMAL HIGH (ref 11.5–15.5)
WBC: 11 10*3/uL — ABNORMAL HIGH (ref 4.0–10.5)

## 2010-09-30 LAB — DIFFERENTIAL
Basophils Relative: 0 % (ref 0–1)
Eosinophils Absolute: 0.2 10*3/uL (ref 0.0–0.7)
Eosinophils Relative: 2 % (ref 0–5)
Monocytes Absolute: 0.5 10*3/uL (ref 0.1–1.0)
Monocytes Relative: 4 % (ref 3–12)
Neutro Abs: 7.5 10*3/uL (ref 1.7–7.7)

## 2010-10-02 LAB — COMPREHENSIVE METABOLIC PANEL
Albumin: 3.2 g/dL — ABNORMAL LOW (ref 3.5–5.2)
Alkaline Phosphatase: 85 U/L (ref 39–117)
BUN: 8 mg/dL (ref 6–23)
GFR calc Af Amer: >60 mL/min (ref >60)
Potassium: 4.3 mEq/L (ref 3.5–5.1)
Total Protein: 5.8 g/dL — ABNORMAL LOW (ref 6.0–8.3)

## 2010-10-02 LAB — RAPID URINE DRUG SCREEN, HOSP PERFORMED
Amphetamines: NOT DETECTED
Opiates: POSITIVE — AB
Tetrahydrocannabinol: NOT DETECTED

## 2010-10-03 LAB — CBC
HCT: 36.5 % (ref 36.0–46.0)
MCV: 85.5 fL (ref 78.0–100.0)
Platelets: 346 10*3/uL (ref 150–400)
RBC: 4.27 MIL/uL (ref 3.87–5.11)
WBC: 13.9 10*3/uL — ABNORMAL HIGH (ref 4.0–10.5)

## 2010-10-03 LAB — COMPREHENSIVE METABOLIC PANEL
BUN: 8 mg/dL (ref 6–23)
CO2: 25 mEq/L (ref 19–32)
Chloride: 93 mEq/L — ABNORMAL LOW (ref 96–112)
Creatinine, Ser: 0.72 mg/dL (ref 0.4–1.2)
GFR calc non Af Amer: >60 mL/min (ref >60)
Total Bilirubin: 0.5 mg/dL (ref 0.3–1.2)

## 2010-10-03 LAB — DIFFERENTIAL
Basophils Absolute: 0.1 10*3/uL (ref 0.0–0.1)
Lymphocytes Relative: 9 % — ABNORMAL LOW (ref 12–46)
Neutro Abs: 11.7 10*3/uL — ABNORMAL HIGH (ref 1.7–7.7)

## 2010-10-03 LAB — PROTIME-INR: Prothrombin Time: 13 seconds (ref 11.6–15.2)

## 2010-10-04 ENCOUNTER — Inpatient Hospital Stay (HOSPITAL_COMMUNITY)
Admission: EM | Admit: 2010-10-04 | Discharge: 2010-10-09 | DRG: 190 | Disposition: A | Discharge status: Home / Self Care | Payer: Medicare Other | Attending: Internal Medicine | Admitting: Internal Medicine

## 2010-10-04 DIAGNOSIS — J441 Chronic obstructive pulmonary disease with (acute) exacerbation: Principal | ICD-10-CM | POA: Diagnosis present

## 2010-10-04 DIAGNOSIS — F17200 Nicotine dependence, unspecified, uncomplicated: Secondary | ICD-10-CM | POA: Diagnosis present

## 2010-10-04 DIAGNOSIS — J90 Pleural effusion, not elsewhere classified: Secondary | ICD-10-CM | POA: Diagnosis present

## 2010-10-04 DIAGNOSIS — D72829 Elevated white blood cell count, unspecified: Secondary | ICD-10-CM | POA: Diagnosis not present

## 2010-10-04 DIAGNOSIS — K219 Gastro-esophageal reflux disease without esophagitis: Secondary | ICD-10-CM | POA: Diagnosis present

## 2010-10-04 DIAGNOSIS — R0789 Other chest pain: Secondary | ICD-10-CM | POA: Diagnosis not present

## 2010-10-04 DIAGNOSIS — D649 Anemia, unspecified: Secondary | ICD-10-CM | POA: Diagnosis present

## 2010-10-04 DIAGNOSIS — F341 Dysthymic disorder: Secondary | ICD-10-CM | POA: Diagnosis present

## 2010-10-04 DIAGNOSIS — K2270 Barrett's esophagus without dysplasia: Secondary | ICD-10-CM | POA: Diagnosis present

## 2010-10-04 DIAGNOSIS — E041 Nontoxic single thyroid nodule: Secondary | ICD-10-CM | POA: Diagnosis present

## 2010-10-04 DIAGNOSIS — E876 Hypokalemia: Secondary | ICD-10-CM | POA: Diagnosis present

## 2010-10-04 DIAGNOSIS — T380X5A Adverse effect of glucocorticoids and synthetic analogues, initial encounter: Secondary | ICD-10-CM | POA: Diagnosis not present

## 2010-10-04 DIAGNOSIS — F429 Obsessive-compulsive disorder, unspecified: Secondary | ICD-10-CM | POA: Diagnosis present

## 2010-10-04 DIAGNOSIS — J189 Pneumonia, unspecified organism: Secondary | ICD-10-CM | POA: Diagnosis present

## 2010-10-04 DIAGNOSIS — Z96649 Presence of unspecified artificial hip joint: Secondary | ICD-10-CM

## 2010-10-04 LAB — POCT I-STAT, CHEM 8
Creatinine, Ser: 0.8 mg/dL (ref 0.4–1.2)
HCT: 37 % (ref 36.0–46.0)
Hemoglobin: 12.6 g/dL (ref 12.0–15.0)
Potassium: 3.9 mEq/L (ref 3.5–5.1)
Sodium: 140 mEq/L (ref 135–145)
TCO2: 22 mmol/L (ref 0–100)

## 2010-10-04 LAB — RAPID URINE DRUG SCREEN, HOSP PERFORMED
Amphetamines: NOT DETECTED
Benzodiazepines: NOT DETECTED
Opiates: POSITIVE — AB
Tetrahydrocannabinol: NOT DETECTED

## 2010-10-04 LAB — URINE MICROSCOPIC-ADD ON

## 2010-10-04 LAB — CBC
MCHC: 32.9 g/dL (ref 30.0–36.0)
Platelets: 418 10*3/uL — ABNORMAL HIGH (ref 150–400)
RBC: 4.25 MIL/uL (ref 3.87–5.11)

## 2010-10-04 LAB — DIFFERENTIAL
Basophils Absolute: 0.1 10*3/uL (ref 0.0–0.1)
Basophils Relative: 2 % — ABNORMAL HIGH (ref 0–1)
Monocytes Relative: 6 % (ref 3–12)
Neutro Abs: 5.3 10*3/uL (ref 1.7–7.7)
Neutrophils Relative %: 65 % (ref 43–77)

## 2010-10-04 LAB — URINALYSIS, ROUTINE W REFLEX MICROSCOPIC
Hgb urine dipstick: NEGATIVE
Protein, ur: NEGATIVE mg/dL
Urobilinogen, UA: 0.2 mg/dL (ref 0.0–1.0)

## 2010-10-04 LAB — TSH: TSH: 1.708 u[IU]/mL (ref 0.350–4.500)

## 2010-10-05 ENCOUNTER — Emergency Department (HOSPITAL_COMMUNITY): Payer: Medicare Other

## 2010-10-05 ENCOUNTER — Encounter (HOSPITAL_COMMUNITY): Payer: Self-pay

## 2010-10-05 LAB — DIFFERENTIAL
Basophils Absolute: 0.1 10*3/uL (ref 0.0–0.1)
Lymphocytes Relative: 7 % — ABNORMAL LOW (ref 12–46)
Lymphs Abs: 0.5 10*3/uL — ABNORMAL LOW (ref 0.7–4.0)
Neutro Abs: 6.4 10*3/uL (ref 1.7–7.7)
Neutrophils Relative %: 91 % — ABNORMAL HIGH (ref 43–77)

## 2010-10-05 LAB — POCT I-STAT, CHEM 8
Chloride: 103 mEq/L (ref 96–112)
HCT: 32 % — ABNORMAL LOW (ref 36.0–46.0)
Hemoglobin: 10.9 g/dL — ABNORMAL LOW (ref 12.0–15.0)
Potassium: 3.2 mEq/L — ABNORMAL LOW (ref 3.5–5.1)
Sodium: 139 mEq/L (ref 135–145)

## 2010-10-05 LAB — APTT: aPTT: 32 seconds (ref 24–37)

## 2010-10-05 LAB — PROTIME-INR
INR: 1.05 (ref 0.00–1.49)
Prothrombin Time: 13.9 seconds (ref 11.6–15.2)

## 2010-10-05 LAB — CBC
MCHC: 32 g/dL (ref 30.0–36.0)
MCV: 85.4 fL (ref 78.0–100.0)
Platelets: 402 10*3/uL — ABNORMAL HIGH (ref 150–400)
RDW: 13.6 % (ref 11.5–15.5)
WBC: 7 10*3/uL (ref 4.0–10.5)

## 2010-10-05 LAB — CARDIAC PANEL(CRET KIN+CKTOT+MB+TROPI)
Total CK: 37 U/L (ref 7–177)
Total CK: 47 U/L (ref 7–177)
Total CK: 62 U/L (ref 7–177)

## 2010-10-05 MED ORDER — IOHEXOL 300 MG/ML  SOLN
100.0000 mL | Freq: Once | INTRAMUSCULAR | Status: AC | PRN
  Administered 2010-10-05: 100 mL via INTRAVENOUS

## 2010-10-06 ENCOUNTER — Other Ambulatory Visit: Payer: Self-pay | Admitting: Diagnostic Radiology

## 2010-10-06 ENCOUNTER — Inpatient Hospital Stay (HOSPITAL_COMMUNITY): Payer: Medicare Other

## 2010-10-06 DIAGNOSIS — J90 Pleural effusion, not elsewhere classified: Secondary | ICD-10-CM

## 2010-10-06 DIAGNOSIS — J441 Chronic obstructive pulmonary disease with (acute) exacerbation: Secondary | ICD-10-CM

## 2010-10-06 LAB — COMPREHENSIVE METABOLIC PANEL
BUN: 5 mg/dL — ABNORMAL LOW (ref 6–23)
CO2: 27 mEq/L (ref 19–32)
Calcium: 8.5 mg/dL (ref 8.4–10.5)
Creatinine, Ser: 0.53 mg/dL (ref 0.4–1.2)
GFR calc non Af Amer: >60 mL/min (ref >60)
Glucose, Bld: 167 mg/dL — ABNORMAL HIGH (ref 70–99)
Total Protein: 5.9 g/dL — ABNORMAL LOW (ref 6.0–8.3)

## 2010-10-06 LAB — PROTEIN, BODY FLUID: Total protein, fluid: 3.3 g/dL

## 2010-10-06 LAB — MAGNESIUM: Magnesium: 2.1 mg/dL (ref 1.5–2.5)

## 2010-10-06 LAB — GLUCOSE, SEROUS FLUID: Glucose, Fluid: 138 mg/dL

## 2010-10-06 LAB — DIFFERENTIAL
Basophils Absolute: 0 10*3/uL (ref 0.0–0.1)
Eosinophils Relative: 0 % (ref 0–5)
Lymphocytes Relative: 6 % — ABNORMAL LOW (ref 12–46)
Lymphs Abs: 1.1 10*3/uL (ref 0.7–4.0)
Monocytes Absolute: 1 10*3/uL (ref 0.1–1.0)
Monocytes Relative: 6 % (ref 3–12)

## 2010-10-06 LAB — BODY FLUID CELL COUNT WITH DIFFERENTIAL
Eos, Fluid: 37 %
Neutrophil Count, Fluid: 3 % (ref 0–25)
Total Nucleated Cell Count, Fluid: 2625 cu mm — ABNORMAL HIGH (ref 0–1000)

## 2010-10-06 LAB — LACTATE DEHYDROGENASE, PLEURAL OR PERITONEAL FLUID: LD, Fluid: 359 U/L — ABNORMAL HIGH (ref 3–23)

## 2010-10-06 LAB — CBC
HCT: 33.5 % — ABNORMAL LOW (ref 36.0–46.0)
Hemoglobin: 10.5 g/dL — ABNORMAL LOW (ref 12.0–15.0)
MCH: 26.8 pg (ref 26.0–34.0)
MCHC: 31.3 g/dL (ref 30.0–36.0)
MCV: 85.5 fL (ref 78.0–100.0)
RDW: 13.7 % (ref 11.5–15.5)

## 2010-10-07 LAB — BASIC METABOLIC PANEL
BUN: 8 mg/dL (ref 6–23)
CO2: 32 mEq/L (ref 19–32)
Chloride: 97 mEq/L (ref 96–112)
Glucose, Bld: 104 mg/dL — ABNORMAL HIGH (ref 70–99)
Potassium: 3.8 mEq/L (ref 3.5–5.1)
Sodium: 137 mEq/L (ref 135–145)

## 2010-10-07 LAB — IRON AND TIBC
Saturation Ratios: 9 % — ABNORMAL LOW (ref 20–55)
UIBC: 244 ug/dL

## 2010-10-07 LAB — CARDIAC PANEL(CRET KIN+CKTOT+MB+TROPI)
Relative Index: INVALID (ref 0.0–2.5)
Total CK: 21 U/L (ref 7–177)

## 2010-10-07 LAB — CBC
HCT: 36.3 % (ref 36.0–46.0)
Hemoglobin: 11.3 g/dL — ABNORMAL LOW (ref 12.0–15.0)
MCV: 86.2 fL (ref 78.0–100.0)
WBC: 12.4 10*3/uL — ABNORMAL HIGH (ref 4.0–10.5)

## 2010-10-07 LAB — VITAMIN B12: Vitamin B-12: 566 pg/mL (ref 211–911)

## 2010-10-07 LAB — FOLATE: Folate: 12.6 ng/mL

## 2010-10-08 ENCOUNTER — Inpatient Hospital Stay (HOSPITAL_COMMUNITY): Payer: Medicare Other

## 2010-10-08 DIAGNOSIS — J441 Chronic obstructive pulmonary disease with (acute) exacerbation: Secondary | ICD-10-CM

## 2010-10-08 DIAGNOSIS — J90 Pleural effusion, not elsewhere classified: Secondary | ICD-10-CM

## 2010-10-08 LAB — CBC
MCV: 86.2 fL (ref 78.0–100.0)
Platelets: 412 10*3/uL — ABNORMAL HIGH (ref 150–400)
RBC: 4.2 MIL/uL (ref 3.87–5.11)
WBC: 12.9 10*3/uL — ABNORMAL HIGH (ref 4.0–10.5)

## 2010-10-08 LAB — BASIC METABOLIC PANEL
Chloride: 99 mEq/L (ref 96–112)
GFR calc Af Amer: >60 mL/min (ref >60)
Potassium: 3.9 mEq/L (ref 3.5–5.1)

## 2010-10-08 LAB — PATHOLOGIST SMEAR REVIEW

## 2010-10-08 NOTE — Consult Note (Signed)
  NAMEQUIARA, [name]                ACCOUNT NO.:  [id number]  MEDICAL RECORD NO.:  [id number]           PATIENT TYPE:  I  LOCATION:  1502                         FACILITY:  [facility]  PHYSICIAN:  Charlcie Cradle. Delford Field, MD, FCCPDATE OF BIRTH:  [birth year]  DATE OF CONSULTATION:  10/05/2010 DATE OF DISCHARGE:                                CONSULTATION   CHIEF COMPLAINT:  Shortness of breath, cough, chest discomfort.  HISTORY OF PRESENT ILLNESS:  This 60 year old female was admitted from the emergency room on October 04, 2010, with chief complaint of dyspnea. She is actively smoking, had recent hip surgery and had rehab for this. Onset gradually of increasing dyspnea, was found to have COPD exacerbation, right-sided pneumonic process and pleural effusion.  The patient is admitted for further inpatient care.  Cultures so far negative.  Cardiac enzymes unremarkable.  Because of loculated pleural effusion, Pulmonary is asked to see this patient.  PAST MEDICAL HISTORY:  COPD, anxiety disorder, depression, drug dependence reflux disease, history of gastric outlet obstruction, Barrett's esophagus.  PAST SURGICAL HISTORY:  Hernia repair, peptic ulcer disease with Nissen fundoplication, left hip hemiarthroplasty due to fall in February 2012.  ALLERGIES:  MORPHINE.  SOCIAL HISTORY:  Still smoking 3 packs of cigarettes per day.  FAMILY HISTORY:  Positive for COPD.  CURRENT MEDICATIONS:  As an outpatient prior to admission included promethazine p.r.n., folic acid daily, iron daily, Robaxin p.r.n., Percocet p.r.n., multivitamins, Prilosec daily, Prozac daily.  PHYSICAL EXAMINATION:  VITAL SIGNS:  Temperature is 97.9, blood pressure 143/87, respirations 20, saturation was 93% on 2 L, pulse 85. CHEST:  Showed distant breath sounds with decreased breath sounds on the right. CARDIAC EXAM:  Regular rate and rhythm.  No S3.  Normal S1, S2. ABDOMEN:  Soft, nontender. EXTREMITIES:  Showed no edema,  clubbing, or venous disease. SKIN:  Clear. NEUROLOGIC:  Exam intact.  LABORATORY DATA:  CT scan of chest showed no pulmonary emboli, small to moderate partial loculation and right-sided pleural effusion associated with right lower lobe atelectasis and airspace disease, COPD changes. Cardiac enzymes negative.  White count was 7000, hemoglobin 10.7. Sodium 139, potassium 3.2, chloride 103, CO2 of 25, BUN 3, creatinine 0.7.  IMPRESSION:  Chronic obstructive lung disease exacerbation with ongoing smoking use, right-sided loculated pleural effusion, right-sided pneumonia, likely community-acquired pneumonia or parapneumonic effusion.  RECOMMENDATIONS:  Recommendations would be to tap right pleural space under ultrasound, send for appropriate studies.  Continue current program including Avelox, prednisone, and bronchodilator therapy.     Charlcie Cradle Delford Field, MD, FCCP     PEW/MEDQ  D:  10/05/2010  T:  10/06/2010  Job:  [id number]  Electronically Signed by Shan Levans MD FCCP on 10/07/2010 07:04:26 PM

## 2010-10-09 ENCOUNTER — Telehealth: Payer: Self-pay | Admitting: Internal Medicine

## 2010-10-09 DIAGNOSIS — J189 Pneumonia, unspecified organism: Secondary | ICD-10-CM

## 2010-10-09 LAB — CBC
HCT: 37.1 % (ref 36.0–46.0)
HCT: 35.6 % — ABNORMAL LOW (ref 36.0–46.0)
Hemoglobin: 11.3 g/dL — ABNORMAL LOW (ref 12.0–15.0)
Hemoglobin: 11.8 g/dL — ABNORMAL LOW (ref 12.0–15.0)
Platelets: 375 10*3/uL (ref 150–400)
WBC: 11.1 10*3/uL — ABNORMAL HIGH (ref 4.0–10.5)
WBC: 11.3 10*3/uL — ABNORMAL HIGH (ref 4.0–10.5)

## 2010-10-09 LAB — DIFFERENTIAL
Basophils Absolute: 0.1 10*3/uL (ref 0.0–0.1)
Basophils Relative: 1 % (ref 0–1)
Eosinophils Absolute: 0 10*3/uL (ref 0.0–0.7)
Eosinophils Relative: 0 % (ref 0–5)
Monocytes Absolute: 0.7 10*3/uL (ref 0.1–1.0)
Neutro Abs: 8.3 10*3/uL — ABNORMAL HIGH (ref 1.7–7.7)

## 2010-10-09 LAB — URINALYSIS, ROUTINE W REFLEX MICROSCOPIC
Glucose, UA: NEGATIVE mg/dL
pH: 6 (ref 5.0–8.0)

## 2010-10-09 LAB — COMPREHENSIVE METABOLIC PANEL
ALT: 9 U/L (ref 0–35)
Albumin: 3.6 g/dL (ref 3.5–5.2)
Alkaline Phosphatase: 91 U/L (ref 39–117)
BUN: 6 mg/dL (ref 6–23)
Chloride: 103 mEq/L (ref 96–112)
Glucose, Bld: 113 mg/dL — ABNORMAL HIGH (ref 70–99)
Potassium: 3.7 mEq/L (ref 3.5–5.1)
Total Bilirubin: 0.7 mg/dL (ref 0.3–1.2)

## 2010-10-09 LAB — BODY FLUID CULTURE

## 2010-10-09 NOTE — H&P (Signed)
NAMEJOSLYNN, [name] NO.:  [id number]  MEDICAL RECORD NO.:  [id number]           PATIENT TYPE:  E  LOCATION:  WLED                         FACILITY:  [facility]  PHYSICIAN:  Talmage Nap, MD  DATE OF BIRTH:  [birth year]  DATE OF ADMISSION:  10/04/2010 DATE OF DISCHARGE:                             HISTORY & PHYSICAL   PRIMARY CARE PHYSICIAN:  [name] Mater. Clent Ridges, MD.  PRIMARY GASTROENTEROLOGIST:  [name], M.D.  History is obtainable from the patient.  CHIEF COMPLAINT:  Shortness of breath and cough of unspecified duration.  HISTORY OF PRESENT ILLNESS:  The patient is a 60 year old Caucasian female with a history of chronic tobacco use, COPD, most recently had left hip hemiarthroplasty secondary to a fall with gait instability, presenting to the emergency room with unspecified duration of shortness of breath.  This, however, got worse 24 hours prior to presenting to the emergency room.  The shortness of breath is associated with cough, but is nonproductive of sputum.  The patient claims she was febrile, but denied any chills.  Denied any rigors.  She denied any history of nausea or vomiting.  She also denied any history of PND or orthopnea and subsequently presented to the emergency room for evaluation.  PAST MEDICAL HISTORY:  Positive for: 1. COPD. 2. Anxiety disorder. 3. Depression. 4. Drug dependence. 5. GERD. 6. Hiatal hernia. 7. History of gastric outlet obstruction. 8. Barrett esophagus.  PAST SURGICAL HISTORY: 1. Hernia repair. 2. Peptic ulcer disease, status post Nissen fundoplication. 3. Left hip hemiarthroplasty secondary to a fall/fracture.  PREADMISSION MEDICATIONS:  Preadmission medications without dosages include: 1. Hydrocodone/acetaminophen. 2. Phenergan. 3. Prilosec. 4. Prozac.  ALLERGIES:  MORPHINE.  SOCIAL HISTORY:  The patient smokes about 3 packs of cigarettes per day for the past 30 years, but attempting now to cut  down her cigarette smoking.  Occasionally drinks alcohol and she stopped in February.  FAMILY HISTORY:  Positive for COPD.  REVIEW OF SYSTEMS:  The patient denies any history of headaches.  No blurred vision.  No nausea or vomiting.  Presently denies any fever.  No chills or rigor.  Complained of shortness of breath with cough that is nonproductive of sputum with associated nonpleuritic chest pain.  Denies any PND or orthopnea.  No abdominal discomfort.  No diarrhea or hematochezia.  No dysuria or hematuria.  No swelling of the lower extremities.  No intolerance to heat or cold.  No neuropsychiatric disorder.  PHYSICAL EXAMINATION:  GENERAL:  Middle aged lady, very anxious, not in any obvious respiratory distress. VITAL SIGNS:  Blood pressure is 135/52, pulse is 94, respiratory rate 20, temperature is 99.4. HEENT:  Pupils are reactive to light.  Extraocular muscles are intact. NECK:  No jugular venous distention.  No carotid bruit.  No lymphadenopathy. CHEST:  Decreased breath sounds globally with minimal rhonchi. HEART:  S1 and S2.ABDOMEN:  Soft, nontender.  Liver, spleen and kidney not palpable. Bowel sounds are positive. EXTREMITIES:  No pedal edema. NEUROLOGIC:  Nonfocal. MUSCULOSKELETAL:  Arthritic changes in the knees and feet. NEUROPSYCHIATRIC:  Evaluation is unremarkable.  LABORATORY DATA:  Chem-8  STAT showed sodium of 138, potassium of 3.2, chloride 103,  glucose is 137, BUN is 3, creatinine 0.7.  Complete blood count with differential showed WBC of 7.0, hemoglobin of 10.7, hematocrit of 33.4, MCV 85.4 with a platelet count of 402, neutrophils 91% and absolute neutrophil count is 6.4.  Imaging studies done include chest x-ray, which showed moderate sized right pleural effusion and small amount of atelectasis or scarring at the medial right lung base.  CT angiogram showed no evidence of pulmonary embolism.  There is small to moderate partial loculated right- sided  pleural effusion with associated right lower lobe atelectasis. There is mild bilateral emphysema and a 2.0 heterogenous hypodense mass within the right thyroid lobe and a 2.4 cm cyst noted.  IMPRESSION: 1. Chronic obstructive pulmonary disease exacerbation. 2. Right pleural effusion. 3. Hypokalemia. 4. Chronic tobacco use. 5. Anxiety disorder. 6. Depression. 7. Peptic ulcer disease/Barrett esophagus. 8. History of hiatal hernia. 9. Status post left hip hemiarthroplasty secondary to a fall with gait     instability.  PLAN:  Admit the patient to general medical floor.  The patient will be on O2 via nasal cannula 3 L per minute, Lasix 40 mg IV q.24.  She will also be given albuterol and Atrovent nebs q.4 hours and Solu-Medrol 60 mg IV q.12.  She empirically will be given Avelox 400 mg IV q.24. that Other medication to be given to the patient include Protonix 40 mg IV q.24 for  GI prophylaxis.She will be on Xanax 0.25 mg p.o. b.i.d. for anxiety, Prozac 20 mg p.o. daily, hypokalemia will be corrected with KCl 20 mEq p.o. b.i.d. and DVT prophylaxis will be with Lovenox 40 mg subcutaneous q.24.  Further labs to be done on this patient will include cardiac enzymes q.6 x3.  CBCD, CMP and magnesium will be repeated in a.m. and blood culture x2 before starting IV antibiotics.  The patient will be followed and evaluated on day-to-day basis.     Talmage Nap, MD     CN/MEDQ  D:  10/05/2010  T:  10/05/2010  Job:  [id number]  Electronically Signed by Talmage Nap  on 10/09/2010 10:06:56 AM

## 2010-10-09 NOTE — Telephone Encounter (Signed)
Will forward this to MW as an Financial planner

## 2010-10-09 NOTE — Discharge Summary (Signed)
[name], [name]                ACCOUNT NO.:  [id number]  MEDICAL RECORD NO.:  [id number]           PATIENT TYPE:  I  LOCATION:  1502                         FACILITY:  [facility]  PHYSICIAN:  Conley Canal, MD      DATE OF BIRTH:  01/05/1951  DATE OF ADMISSION:  10/04/2010 DATE OF DISCHARGE:  10/09/2010                         DISCHARGE SUMMARY-REFERRING   PRIMARY CARE PHYSICIAN:  Tera Mater. Clent Ridges, MD  GASTROENTEROLOGIST:  Llana Aliment. Randa Evens, M.D.  CONSULTING PHYSICIANS:  Charlcie Cradle. Delford Field, MD, FCCP  DISCHARGE DIAGNOSES: 1. Acute exacerbation of chronic obstructive pulmonary disease. 2. Loculated right-sided pleural effusion, most likely parapneumonic. 3. Right-sided thyroid nodule 2-cm, needs follow up outpatient. 4. Anxiety disorder. 5. Gastroesophageal reflux disease. 6. History of gastric outlet obstruction. 7. Barrett esophagus. 8. Left hip fracture February 2012.  DISCHARGE MEDICATIONS: 1. Albuterol inhalations q.4 hour as needed. 2. Atrovent inhalations q.4 hour as needed. 3. Depakote 250 mg at bedtime. 4. Moxifloxacin 400 mg daily for 9 more days. 5. Prednisone taper. 6. Folic acid 1 mg daily. 7. Vicodin 5/500 mg one to two tablets every 6 hours as needed. 8. Iron OTC 1 tablet daily. 9. Robaxin 1-2 tablets every 6 hours as needed. 10.Multivitamins 1 tablet daily. 11.Prilosec 40 mg twice daily. 12.Promethazine 25 mg q.4 to 6 hour as needed. 13.Prozac 80 mg daily.  PROCEDURES PERFORMED: 1. Ultrasound-guided thoracentesis on April 14 with 630 mL of     exudative pleural fluid drained. 2. CT of the chest with contrast April 13 showed no evidence of     pulmonary embolism, but small-to-moderate partially loculated right-     sided pleural effusion with associated right lower lobe atelectasis     and some mild bilateral emphysema in the upper lung zones as well     as a 2-cm heterogeneously hypodense mass within the right thyroid     lobe as well as a 2.4-cm left  renal cysts. 3. A 2-D echocardiogram on April 16 showed EF 60%-65%, grade 1     diastolic dysfunction.  HOSPITAL COURSE:  [name] was admitted on April 12 with complaints of shortness of breath and cough.  She was found to have acute exacerbation of COPD with loculated right pleural effusion, now thought to be parapneumonic effusion.  The patient was initially placed on oxygen, bronchodilators, systemic steroids, Avelox, and was seen by Pulmonary who recommended taping of the right pleural effusion.  The effusion culture was negative although it was exudative with an LDH of 359.  This is thought to be parapneumonic.  The patient continued to make good progress with antibiotics and systemic steroids and plans are for her to complete 2 weeks of antibiotics and continue steroid taper and bronchodilators and for her to follow with Pulmonary Dr. Sherene Sires in 1 week's time.  Strong smoking cessation counseling was given.  The patient says she will use an electric cigarette.  She should follow with Dr. Tera Mater. Fry  in 1-2 weeks. Of note is that because of complaints of chest pain despite relieving of the right-sided pleural effusion, there was concern for a cardiac event.  Hence  cardiac enzymes which were negative and subsequently a 2-D echocardiogram, which showed normal left ventricular function with no wall motion abnormalities.  Today labs are significant for WBC 11.1, hemoglobin 11.3, hematocrit 37.1, platelet count 431.  Electrolytes unremarkable.  The patient is discharged in stable condition.  The time spent for discharge preparation is less than 30 minutes.     Conley Canal, MD     SR/MEDQ  D:  10/09/2010  T:  10/09/2010  Job:  [id number]  cc:   Jeannett Senior A. Clent Ridges, MD [address] [place] [address]  Charlcie Cradle Delford Field, MD, FCCP [address] [place] [address]  Charlaine Dalton. Sherene Sires, MD, FCCP [address] [place] [address]  Electronically Signed by  Conley Canal  on 10/09/2010 09:15:02 PM

## 2010-10-10 ENCOUNTER — Telehealth: Payer: Self-pay | Admitting: *Deleted

## 2010-10-10 NOTE — Telephone Encounter (Signed)
Lost pres for nebulizer when discharged from hospital and needs one called into [facility] from Dr. Clent Ridges if possible.  Please fax the prescription and call pt if he will do it.  [phone]

## 2010-10-11 LAB — CULTURE, BLOOD (ROUTINE X 2)
Culture  Setup Time: 201204131346
Culture: NO GROWTH

## 2010-10-11 NOTE — Telephone Encounter (Signed)
Please take care of this.  

## 2010-10-11 NOTE — Telephone Encounter (Signed)
[facility] notified spoke to [facility] and she stated to fax rx with dx and they will contac the patient,  rx faxed to [phone]

## 2010-10-14 ENCOUNTER — Other Ambulatory Visit: Payer: Self-pay | Admitting: Family Medicine

## 2010-10-16 ENCOUNTER — Encounter: Payer: Self-pay | Admitting: Internal Medicine

## 2010-10-18 ENCOUNTER — Ambulatory Visit: Payer: Medicare Other | Admitting: Internal Medicine

## 2010-10-19 ENCOUNTER — Other Ambulatory Visit: Payer: Self-pay | Admitting: *Deleted

## 2010-10-19 MED ORDER — DIAZEPAM 5 MG PO TABS: 5.0000 mg | ORAL_TABLET | Freq: Two times a day (BID) | ORAL | Status: DC

## 2010-10-19 NOTE — Telephone Encounter (Signed)
Call in #[phone] with 2 rf 

## 2010-10-19 NOTE — Telephone Encounter (Signed)
Refill diazepam 5mg  1 po bid bid  (on decreasing dose till weans off)

## 2010-10-23 ENCOUNTER — Encounter: Payer: Self-pay | Admitting: Family Medicine

## 2010-10-23 ENCOUNTER — Ambulatory Visit (INDEPENDENT_AMBULATORY_CARE_PROVIDER_SITE_OTHER): Payer: Medicare Other | Admitting: Family Medicine

## 2010-10-23 VITALS — BP 86/62 | HR 83 | Temp 97.6°F | Resp 16 | Wt 115.3 lb

## 2010-10-23 DIAGNOSIS — R52 Pain, unspecified: Secondary | ICD-10-CM

## 2010-10-23 DIAGNOSIS — R1013 Epigastric pain: Secondary | ICD-10-CM

## 2010-10-23 DIAGNOSIS — F419 Anxiety disorder, unspecified: Secondary | ICD-10-CM

## 2010-10-23 DIAGNOSIS — F411 Generalized anxiety disorder: Secondary | ICD-10-CM

## 2010-10-23 DIAGNOSIS — J189 Pneumonia, unspecified organism: Secondary | ICD-10-CM

## 2010-10-23 MED ORDER — HYDROCODONE-ACETAMINOPHEN 5-500 MG PO TABS: 1.0000 | ORAL_TABLET | Freq: Three times a day (TID) | ORAL | Status: DC | PRN

## 2010-10-23 MED ORDER — DIAZEPAM 5 MG PO TABS: 5.0000 mg | ORAL_TABLET | Freq: Three times a day (TID) | ORAL | Status: DC | PRN

## 2010-10-23 NOTE — Progress Notes (Signed)
  Subjective:    Patient ID: [name], female    DOB: 1950/09/03, 60 y.o.   MRN: [id number]  HPI Here to follow up after a hospital stay sveral weeks ago for pneumonia. She seems to have gotten over this well, and she denies any chest congestion or cough lately. She has stopped smoking for the past 3 weeks by using an electronic cigarette device. She has been very nervous despite taking Diazepam bid. She would like to take this tid if possible. She also needs refills on pain meds but wants to decrease the dose.    Review of Systems  Constitutional: Negative.   Respiratory: Negative.   Cardiovascular: Negative.   Gastrointestinal: Positive for abdominal pain.  Psychiatric/Behavioral: The patient is nervous/anxious.        Objective:   Physical Exam  Constitutional: She appears well-developed and well-nourished.  Cardiovascular: Normal rate, regular rhythm, normal heart sounds and intact distal pulses.   Pulmonary/Chest: Effort normal and breath sounds normal. No respiratory distress. She has no wheezes. She has no rales. She exhibits no tenderness.  Psychiatric: She has a normal mood and affect. Her behavior is normal.          Assessment & Plan:  We will increase Diazepam to tid. Try Vicodin 5/500 only three a day.

## 2010-11-05 ENCOUNTER — Telehealth: Payer: Self-pay | Admitting: *Deleted

## 2010-11-05 NOTE — Telephone Encounter (Signed)
Stay on Valium 5 mg tid, but double the Depakote to 500 mg a day. Try taking two Depakote at a time for 2 weeks and call back

## 2010-11-05 NOTE — Telephone Encounter (Signed)
Pt is attempting to wean off of Valium, and cannot get under 3 daily.  When she does go down, she continues to cry daily.  Wants to change the med or increase the dose?  Needs guidance from Dr Clent Ridges.

## 2010-11-06 ENCOUNTER — Telehealth: Payer: Self-pay | Admitting: *Deleted

## 2010-11-06 NOTE — Assessment & Plan Note (Signed)
[name]                         GASTROENTEROLOGY OFFICE NOTE   NAME:[name]                       MRN:          [id number]  DATE:04/20/2007                            DOB:          30-Mar-1951    HISTORY OF PRESENT ILLNESS:  [name] is a 60 year old white female  with history of aspirin abuse, smoking and from that resulting from  chronic peptic ulcer disease status post perforated prepyloric ulcer in  March 2006 treated with Cheree Ditto patch followed by developing gastric  outlet obstruction and recurrent gastric ulcer necessitating truncal  vagotomy and gastrojejunal ostomy in July 2008 by Dr. Daphine Deutscher.  In the  interim, she was diagnosed with Barrett's esophagus.  Dr. Daphine Deutscher also  did a Nissen fundoplication at the time of gastrojejunostomy.  She is  now three months postop and had another hospitalization since then for  our tonsillitis obstructive gastric outlet with large prepyloric ulcer.  Dr. Marina Goodell saw her in the hospital as well as Dr. Daphine Deutscher.  She responded  to bowel rest, upper endoscopy on April 02, 2007, confirmed angulated  edematous gastrojejunostomy with  relative obstruction, but he was able  to pass the endoscopy to the jejunum.  She has been on Prilosec 60 mg  daily, and basically liquids.  She has lost about 20 pounds altogether  in the last couple of months being basically on liquids.  She still  continues to smoke.   PHYSICAL EXAMINATION:  VITAL SIGNS:  Blood pressure 98/60, pulse 80,  weight 117 pounds.  GENERAL:  She appeared older than her stated age.  LUNGS:  Clear to auscultation.  COR:  Normal S1, S2.  ABDOMEN:  With hyperpigmentation secondary to heating pad.  She had a  mild tenderness to epigastrium.  Normoactive bowel sounds.  No  succussion splash, no distention.  There was only minimal tenderness in  epigastrium.  Well healed surgical scar in the midline.  RECTAL:  Not done.  EXTREMITIES:  No edema.   IMPRESSION:  A 60 year old white female with severe obstructive peptic  ulcer disease secondary to abuse of aspirin, NSAIDS and smoking, clearly  related to the patient's lifestyle.  She has had so far two surgeries  for complications of her ulcer disease.  She is now functionally  obstructed again partly because of edema at the gastrojejunostomy.  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  Recurrent anastomotic ulcer is contributing to her symptoms which cause  pain with eating.  I have discussed the situation with the patient.  I  feel this is very serious medical problem for her, and I am not sure she  is realizing the gravity of her noncompliance with the medical regimen.  She is becoming somewhat malnourished because of inability to eat well,  but continues to smoke.   PLAN:  1. My plan would be for her to stop smoking, and she requested Chantix      starter pack which I have given her.  2. Add Carafate slurry  1 gm qid to her regimen of Prilosec 20 mg  t.i.d.Marland Kitchen  3. Stay on liquids and dietary supplements and soft foods, the small      meals at frequent intervals.  4. Upper endoscopy scheduled for two weeks from now which will be four      weeks from the most recent endoscopy showing recurrent anastomotic      ulcer.  I have talked to Dr. Daphine Deutscher about her today, and he agrees      that  waiting as long as we can would be beneficial at this point,      maybe her symptoms will improve and the gastroparesis will also      improve.  If not, she would eventually need subtotal gastrectomy      and gastrojejunostomy, but she would be a high risk given her      condition of malnutrition and weight loss.  I also cannot guarantee      that she would always comply with not smoking and not taking      aspirin which would set  her up for even more complications in the      future.     Hedwig Morton. Juanda Chance, MD  Electronically Signed    DMB/MedQ  DD: 04/20/2007   DT: 04/21/2007  Job #: [id number]   cc:   Jeannett Senior A. Clent Ridges, MD  [place] Daphine Deutscher, MD

## 2010-11-06 NOTE — Assessment & Plan Note (Signed)
[facility]                         GASTROENTEROLOGY OFFICE NOTE   NAME:GRUBBEvangelene, Vora                       MRN:          [id number]  DATE:07/23/2007                            DOB:          01-02-1951    PROGRESS NOTE   Ms. Swiney is a 60 year old white female with severe peptic ulcer disease  due to NSAIDS, aspirin, smoking and poor lifestyle.  She had a  perforated ulcer in March, 2006 and recurrent chronic gastric ulcer  documented on most recent endoscopy in November, 2008.  She underwent  repair of the perforated duodenal ulcer with Cheree Ditto patch and  subsequently truncal vagotomy and gastrojejunostomy by Dr. Luretha Murphy in July, 2008.  She at that time also had a Nissen  fundoplication.  She was diagnosed with Barrett's esophagus on upper  endoscopy in October, 2007.  She has a functioning gastric outlet  obstruction because of post vagotomy gastroparesis.  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  She continues to smoke.  She hopefully has stopped  taking her aspirin and Advil.   OTHER MEDICAL PROBLEMS:  1. Chronic bronchitis from smoking.  2. Asthma.   MEDICATIONS:  1. Prilosec 20 mg p.o. b.i.d.  2. Paxil 400 mg p.o. b.i.d.  3. Carafate slurry 2 teaspoons b.i.d.   PHYSICAL EXAM:  Blood pressure 104/74, pulse 66 and weight 120 pounds  which is stable weight since last visit in November.  She appeared  slightly somewhat depressed.  She was alert and oriented.  LUNGS:  With normal breath sounds, no wheezes or rales.  COR:  With rapid S1, rapid S2, no murmur.  ABDOMEN:  With hyperpigmentation of the abdominal wall due to effects of  a heating pad.  She had well  healed vertical scars above the umbilicus.  She had normoactive bowel sounds and really no tenderness.  RECTAL EXAM:  Not repeated.  EXTREMITIES:  No edema.   IMPRESSION:  A 60 year old white female with  post vagotomy  gastroparesis, obstructive gastric outlet obstruction, right prepyloric  ulcer which  has been healing as of last endoscopy November, 2008.  She  continues to smoke, she failed Chantix.   PLAN:  1. Continue Prilosec 20 mg p.o. b.i.d.  2. Continue Carafate 1 gram p.o. b.i.d.  3. Low residue diet with small frequent feedings.   Patient has been denied for disability, she reapplied.  Dr. Daphine Deutscher as  well as Dr. Clent Ridges have written letters of support for her total  disability.  I think she probably is not able to work for next year.  Hopefully the decision will be made as to her need for subtotal  gastrectomy by Dr. Daphine Deutscher should she continue to have gastroparesis and  failure to thrive.  I will see her in about 8 weeks.     Hedwig Morton. Juanda Chance, MD  Electronically Signed    DMB/MedQ  DD: 07/23/2007  DT: 07/23/2007  Job #: [id number]   cc:   Thornton Park Daphine Deutscher, MD  Tera Mater. Clent Ridges, MD

## 2010-11-06 NOTE — Telephone Encounter (Signed)
Okay just stick with 250 mg a day of depakote

## 2010-11-06 NOTE — Op Note (Signed)
[name], [name]                ACCOUNT NO.:  [id number]   MEDICAL RECORD NO.:  [id number]          PATIENT TYPE:  INP   LOCATION:  1521                         FACILITY:  [facility]   PHYSICIAN:  Thornton Park. Daphine Deutscher, MD  DATE OF BIRTH:  [birth year]   DATE OF PROCEDURE:  02/16/2008  DATE OF DISCHARGE:                               OPERATIVE REPORT   PREOPERATIVE DIAGNOSES:  1. Longstanding peptic ulcer disease with previous pyloric      perforations and stenoses.  2. Previous Nissen fundoplication for Barrett's esophagus.  3. Truncal vagotomy.  4. Laparoscopic gastrojejunostomy, looped tight anastomosis with preop      Nitinol stenting.  5. The patient has had poor gastric emptying of both secondary to a      very stenotic gastrojejunostomy and apparently has done somewhat      better with the Nitinol stent that was placed about a month ago.   PROCEDURES:  1. Laparotomy.  2. Takedown of loop gastrojejunostomy with removal of the Nitinol      stent.  3. Creation of hand-sewn long Roux-en-Y gastrojejunostomy with about a      1 foot long Roux limb.   SURGEON:  Thornton Park. Daphine Deutscher, MD   ASSISTANT:  Almond Lint, MD   DESCRIPTION OF PROCEDURE:  [name] was taken to the OR on the  evening of Tuesday, February 16, 2008, given general anesthesia.  The  abdomen was prepped with Techni-Care and draped sterilely.  I entered  the abdomen through an upper midline incision without any difficulty.  Upon entering, the Nitinol stent was seen as a large, inflamed bulge,  coursing anteriorly, that had some reactive processes the anterior  abdominal wall.  I took these down and found a fairly thickened segment  of bowel from the stomach to about 4 inches down the Roux limb.  The  afferent limb  seemed to be fairly marginalized and was not distended.  After looking at the anatomy and everything, I elected to open the small  bowel distally, just distal to the Nitinol stent and I removed it.  I  then decided to divide the afferent limb proximal to the loop and I did  that with a GIA and then I went downstream and created a  jejunojejunostomy in an antimesenteric fashion using the endoscopic  Echelon stapler with a gold cartridge.  The gold cartridge was because I  had previously opened that to attempt to create new legs to the efferent  limb, but I was unable to negotiate that internally, and also there was  a fair amount of bleeding from where the Nitinol stent was removed.   After creating anastomosis, which looked good, I closed the common  defect in 2 layers with 4-0 PDS and 3-0 silk and I closed the mesenteric  defect.  Next I went ahead and attempted to amputate the  gastrojejunostomy.  That was too thick, and I went ahead and excised it  and opened it.  I then swung up the end of the Roux limb placing 2-0  silks on the back wall and then  opening along the antimesenteric border  of the Roux limb and then doing a 2-layer anastomosis with running 3-0  PDS and the inner layer, running locking posteriorly wrapped posterior  row and then running anteriorly in a Mayo fashion.  A second layer of 2-  0 silks completed the anastomosis.  It would take to 2-3 of my fingers  in a lateral side-to-side fashion.  I felt that it was a good  anastomosis.  It lay in there nicely.  I had looked up the left upper  quadrant because of her adhesions from her Nissen and such, any kind of  bowel resection was out of the question as far as I could tell at this  time.  Contamination was to a minimum.  It was controlled with the  Yankauer suction.  After completing the operation.  I irrigated with 5  liters of warm saline.  I then applied Tisseel on both anastomoses and  tip of tongue of omentum to lay it up on top of the gastrojejunostomy.  The wound was closed with #1 PDS from either end, with a single  retention suture of #1 nylon in the middle.  Staples were placed  intermittently in the  incision, which I felt to be contaminated.  The  patient tolerated the procedure well and was taken to the recovery room  in satisfactory condition.      Thornton Park Daphine Deutscher, MD  Electronically Signed     MBM/MEDQ  D:  02/16/2008  T:  02/17/2008  Job:  [id number]   cc:   Hedwig Morton. Juanda Chance, MD  [address]  [place]  [place] [address]

## 2010-11-06 NOTE — Telephone Encounter (Signed)
Pt is calling back stating that a pyschiatrist gave her 500 mg of Depakote, and she had a "black out " for days.  She is afraid to do that again.

## 2010-11-06 NOTE — Telephone Encounter (Signed)
Pt given Dr. Fry's recommendations. 

## 2010-11-06 NOTE — Telephone Encounter (Signed)
LMTCB

## 2010-11-06 NOTE — Discharge Summary (Signed)
[name], [name]                ACCOUNT NO.:  [id number]   MEDICAL RECORD NO.:  [id number]          PATIENT TYPE:  INP   LOCATION:  1503                         [facility]ACILITY:  [facility]   PHYSICIAN:  Valerie A. Felicity Coyer, MDDATE OF BIRTH:  11/19/1950   DATE OF ADMISSION:  01/05/2008  DATE OF DISCHARGE:                               DISCHARGE SUMMARY   PRIMARY CARE PHYSICIAN:  Tera Mater. Clent Ridges, MD.   GASTROENTEROLOGIST:  Hedwig Morton. Juanda Chance, MD.   SURGEONThornton Park. Daphine Deutscher, MD   DISCHARGE DIAGNOSES:  1. Nausea, vomiting, abdominal pain in the setting of severe      gastroparesis secondary to post vagotomy, status post GEJ stent on      January 07, 2008.  2. Ulcerative esophagitis.  3. Iron deficiency anemia.   HISTORY OF PRESENT ILLNESS:  [name] is a 60 year old white female  with extensive gastric history including a perforated duodenal ulcer,  status post repair in March 2006.  The patient also had gastric outlet  obstruction secondary to post vagotomy for surgical repair.   The patient status post Nissen fundoplication in July 2008.  The patient  presented to [facility] emergency room on day of admission with  complaints of upper abdominal pain described as constant and knife-  like with nausea and decreased p.o. intake times 4 weeks.  The patient  reported lying in bed x4 weeks due to pain, pain only mildly relieved by  p.o. Vicodin and heating pad.  Also of note, upon admission the patient  reported being out of PPI times 1-2 weeks with limited financial  resources.  In addition the patient not compliant with Carafate slurry  as she has been unable to afford this medication.  Upon evaluation in  the ER a CT of the abdomen and pelvis revealed a distended stomach with  fluid and food debris with questionable edema of the gastric mucosa.  The patient was admitted for pain control and further evaluation and  treatment.   PAST MEDICAL HISTORY:  1. Severe peptic ulcer disease secondary  to NSAIDs, tobacco abuse, and      aspirin.  2. History of perforated duodenal ulcer status post repair in March      2006.  3. Gastric outlet obstruction secondary to post vagotomy.  4. Gastroparesis status post Nissen fundoplication in July 2008.  5. Chronic bronchitis with asthma.  6. History of opiate dependence with inpatient detox at [facility] in January 2009.  7. Depression.  8. Barrett's esophagus with GERD.  9. Continued tobacco abuse.   COURSE IN HOSPITAL:  Problem # 1:  Abdominal pain, nausea, and vomiting  in the setting of known gastroparesis with chronic gastric outlet  obstruction secondary to vagotomy.  Of note the patient is scheduled to  have surgery by Dr. Luretha Murphy in August 2009.  Dr. Daphine Deutscher  recommended that we consult GI as surgery not felt emergent at this  time.  GI saw this patient in consultation on January 06, 2008.  The  patient underwent EGD on July 16 at which  time a GJ-stent was placed.  EGD revealed a large amount of retained solid and liquid food, also  revealing ulcerative reflux esophagitis from gastric outlet obstruction.  The patient tolerated the procedure well without any complications.  He  is to remain on full liquid diet, at the time of discharge, until  further instruction per Dr. Daphine Deutscher whom she will see next month.  Dr.  Daphine Deutscher is aware of the stent placement, and that it will need to be  removed with gastrectomy.  The patient also instructed to stay on b.i.d.  PPI.   MEDICATIONS AT TIME OF DISCHARGE:  1. Paxil 25 mg p.o. daily.  2. Vicodin 10/325 one to two tablets p.o. q.6 h. p.r.n. pain.  3. Protonix 40 mg p.o. b.i.d.  4. Reglan 10 mg p.o. q.a.c. and bedtime.   PERTINENT LAB WORK:  During this hospitalization, sodium 141, potassium  4.1, BUN 3, creatinine 0.45.  White cell count 10.5, platelets 685,  hemoglobin 11.8, hematocrit 35.1, iron level decreased at 18, TIBC 200,  vitamin B12, and ferritin were within  normal limits.   DISPOSITION:  The patient felt medically stable for discharge home at  this time.  The patient has been encouraged numerous times during this  hospitalization that she needs to quit smoking as this is leading to  many of her GI problems.  Patient requests that she make appointment  with primary care physician.  She is instructed to follow up with Dr.  Carolyne Fiscal in 1-2 weeks post discharge.      [name] Pen, NP      [name]. Felicity Coyer, MD  Electronically Signed    LE/MEDQ  D:  01/08/2008  T:  01/08/2008  Job:  [id number]   cc:   Jeannett Senior A. Clent Ridges, MD  [address] [place]  [place] [address]   Hedwig Morton. Juanda Chance, MD  [address]  [place]  [place] [address]   Thornton Park Daphine Deutscher, MD  [address]  [place]  [place] [address]

## 2010-11-06 NOTE — Discharge Summary (Signed)
[name], [name] NO.:  [id number]   MEDICAL RECORD NO.:  [id number]          PATIENT TYPE:  IPS   LOCATION:  0508                          [facility]ILITY[facility]  [facility]   PHYSICIAN:  Geoffery Lyons, M.D.      DATE OF BIRTH:  04/28/1951   DATE OF ADMISSION:  09/13/2008  DATE OF DISCHARGE:  09/16/2008                               DISCHARGE SUMMARY   CHIEF COMPLAINT/PRESENT ILLNESS:  The was the second admission to [facility] for this 60 year old female who endorsed a  history of opiate dependence, wanting to be detoxed.  Endorsed that she  had been on pain medication following gastrointestinal surgeries for the  past 10 years and 2 months prior to this admission received disability  for her chronic gastrointestinal distress, mostly gastroparesis,  constipation and abdominal pain.  She uses Xanax on an as needed basis  and runs out each month.  When she experiences nausea, she uses 2-3  Phenergan at a time, up to 3 a day.   PAST PSYCHIATRIC HISTORY:  She has been detoxed before, but had to be  placed back on opiates.  No current mental health treatment.   ALCOHOL AND DRUG HISTORY:  As already stated, dependent on opiates.   MEDICAL HISTORY:  1. Gastroparesis.  2. Chronic abdominal pain.   MEDICATIONS:  1. Paxil CR 25 mg per day.  2. Protonix 40 mg per day.  3. MiraLax 17 grams at night.  4. Carafate as needed.  5. Hydrocodone 5/500 two or three at a time.  6. Xanax 0.5 twice a day as needed for anxiety.  7. Phenergan 25 mg 2-3 at a time.   Physical exam failed to show any acute findings.   LABORATORY WORK:  CBC within normal limits.  UA - white blood cells 7-  10.  UDS positive for opiates.   MENTAL STATUS EXAM:  Reveals an alert and cooperative female wanting to  be detoxed from opiates, but at the same time concerned about how to  manage her pain, the possibility of having another surgery and how that  surgery was going to work out for her.   Worried and upset about the  situation that she is in.  Mood, anxiety.  Affect, anxiety.  Thought  processes are logical, coherent and relevant.  No active suicidal or  homicidal ideas.  No hallucinations or delusions.  Cognition well-  preserved.   ADMITTING DIAGNOSES:  Axis I:  Opiate dependence, rule out  benzodiazepine abuse.  Anxiety disorder, not otherwise specified.  Depressive disorder, not otherwise specified.  Axis II:  No diagnosis.  Axis III:  Gastroparesis.  Chronic abdominal pain.  Axis IV:  Moderate.  Axis V:  Global Assessment of Functioning upon admission 35, highest  Global Assessment of Functioning in the last year 60.   COURSE IN THE HOSPITAL:  She was admitted and started in individual and  group psychotherapy.  We detoxified with clonidine.  She was given some  Seroquel as needed for the agitation.  As already stated, a 10-year  history of hydrocodone use.  The last 3 years several surgeries and has  been on opiates all the time.  She has been without them two or more  weeks as she has used more than prescribed.  Without them, increased  withdrawal and worsening of her pain, taking 2-3 at a time.  She  experiences shakes, jerks, nausea, feeling that she is jumping out of  her skin.  She was admitted to [facility] several years ago in  delirium.  At that time, it was not clear what the cause of the delirium  was.  On September 15, 2008, endorsed she had been feeling better, surprised  that the clonidine was holding her the way it was.  The Seroquel did  help.  Endorsed that a psychiatrist once diagnosed her with bipolar  disorder; however, she could not take the medication because it made her  sick.  She felt that anxiety and OCD were more problematic for her.  She  continued to worry about pain management once she was discharged, but  had an appointment with surgeon that was going to determine if more  surgery would be indicated and effective.  By September 16, 2008, she was in  full contact with reality.  No active suicidal or homicidal ideas, no  hallucinations or delusions.  She was actually fully detoxed, the  Seroquel has helped.  She was looking forward to see what the surgeon  had to offer as she did not want to be experiencing this chronic pain  and not be dependent on opiates.   DISCHARGE DIAGNOSES:  Axis I:  Opiate dependence, opiate withdrawal.  Anxiety disorder, not otherwise specified.  Axis II:  No diagnosis.  Axis III:  Gastroparesis, chronic abdominal pain.  Axis IV:  Moderate.  Axis V:  Global Assessment of Functioning upon discharge 50.   Discharged on Paxil CR 25 mg per day, Protonix 40 mg per day, MiraLax 17  grams daily, Boniva continue as prescribed, Carafate as prescribed,  Seroquel 50 mg every 6 hours as needed.   Follow-up with Dr. Lang Snow and Malissa Hippo.      Geoffery Lyons, M.D.  Electronically Signed     IL/MEDQ  D:  10/13/2008  T:  10/13/2008  Job:  [id number]

## 2010-11-06 NOTE — Op Note (Signed)
NAMESHELSY, SENG                ACCOUNT NO.:  [id number]   MEDICAL RECORD NO.:  [id number]          PATIENT TYPE:  OIB   LOCATION:  1535                         [facility]ACILITY[facility]   PHYSICIAN:  Thornton Park. Daphine Deutscher, MD  DATE OF BIRTH:  Apr 20, 1951   DATE OF PROCEDURE:  12/24/2006  DATE OF DISCHARGE:                               OPERATIVE REPORT   PREOPERATIVE DIAGNOSIS:  Gastric outlet obstruction, Nissen  fundoplication, history of Barrett's changes in her esophagus, nicotine  abuse, non-steroidal anti-inflammatory drug abuse.   POSTOPERATIVE DIAGNOSIS:  Gastric outlet obstruction, Nissen  fundoplication, history of Barrett's changes in her esophagus, nicotine  abuse, non-steroidal anti-inflammatory drug abuse.   PROCEDURE:  Laparoscopic enterolysis, laparoscopic Nissen fundoplication  over a #50 lighted bougie with a single pledgeted suture closure of the  hiatus and a three suture wrap, laparoscopic truncal vagotomy, loop  gastrojejunostomy, antecolic.   SURGEON:  Thornton Park. Daphine Deutscher, M.D.   ASSISTANT:  Sharlet Salina T. Hoxworth, M.D.   ANESTHESIA:  General endotracheal anesthesia.   SPECIMENS:  Anterior and posterior vagus nerves.   DESCRIPTION OF PROCEDURE:  [name] is a 60 year old lady with the  above mentioned problems.  She is taken to room 1 after informed consent  had been given on numerous occasions about the different options.  We  considered doing pyloroplasty or bypass.  I went into the abdomen using  OptiVu technique in the left upper quadrant, found her laced with a many  adhesions which I took down with scissors and with the harmonic scalpel.  Where her Cheree Ditto patch closure was, the stomach was plastered to the  falciform ligament and these too were taken down with sharp dissection.  I then installed Nathanson retractor up above and through a total of six  laparoscopic trocar holes, I was able to expose the gastroesophageal  junction and then dissect that area.   I found both posterior and  anterior branches easily, clipped them proximally and distally, and  removed them.  They were sent for path.  I then freed up the distal  esophagus, put a Penrose around it, had good esophageal length, reached  around and got the stomach, removed the Penrose, and passed the lighted  50 bougie.   With the lighted 50 bougie in place, I then plicated the stomach by  invaginating the distal esophagus into the stomach holding it in place  with three sutures of 2-0 Surgidek, also tacking the left side of the  stomach to the wrapped portion of the stomach to try to prevent  herniation.  These three sutures were placed and a good wrap was  present.   Next, we went down and identified the ligament Treitz.  I measured about  40 cm distally and brought up the afferent limb facing to the right, the  efferent limb to the left, and held in place with a running 0 Vicryl.  Common openings were made and the Covidien 60 mm stapler was inserted  with a blue cartridge and fired to create the gastrojejunostomy.  No  bleeding was noted from the staple line.  The  common defect was closed  in two layers with  2-0 Vicryl and with an outside layer using a free suture of 2-0 Vicryl.  Tisseel was applied over that area.  Everything appeared to be in order.  There was minimal spillage if any at all.  The patient tolerated the  procedure well and was taken to the recovery room in satisfactory  condition.      Thornton Park Daphine Deutscher, MD  Electronically Signed     MBM/MEDQ  D:  12/24/2006  T:  12/24/2006  Job:  [id number]   cc:   Tera Mater. Clent Ridges, MD  [address] [place]  [place] [address]   Hedwig Morton. Juanda Chance, MD  [address]  [place]  [place] [address]

## 2010-11-06 NOTE — Discharge Summary (Signed)
NAMESUESAN, MOHRMANN                ACCOUNT NO.:  [id number]   MEDICAL RECORD NO.:  [id number]          PATIENT TYPE:  INP   LOCATION:  1521                         FACILITY:  [facility]   PHYSICIAN:  Thornton Park. Daphine Deutscher, MD  DATE OF BIRTH:  11-05-1950   DATE OF ADMISSION:  02/09/2008  DATE OF DISCHARGE:  02/26/2008                               DISCHARGE SUMMARY   PROCEDURE:  Takedown loop gastrojejunostomy with removal of nitinol  stent, Roux-en-Y gastrojejunostomy hand-sewn.   COURSE IN THE HOSPITAL:  Westlyn Glaza is a 60 year old lady with chronic  problems with peptic ulcer disease and gastric outlet obstruction.  She  was admitted preoperatively because of inanition and failure of keeping  anything down and dehydration and malnutrition and was started on T&A,  and was managed until her prealbumin had risen sufficiently, at which  time she was rescheduled for surgery and taken to the OR for an open  gastric operation.  She tolerated the procedure well.  She did have some  edema issues postoperatively.  We checked out for DVT and she had none.  It was mainly from anasarca from her poor protein calorie malnutrition.  She was maintained on T&A until we could start her on was some on  nutritional supplements which were liquid and she was ready for  discharge on an oral pain med and follow up with me in the office in 3  weeks.   FINAL DIAGNOSIS:  Gastric outlet obstruction status post Roux-en-Y  gastrojejunostomy.      Thornton Park Daphine Deutscher, MD  Electronically Signed     MBM/MEDQ  D:  03/16/2008  T:  03/16/2008  Job:  [id number]   cc:   Hedwig Morton. Juanda Chance, MD  [address]  [place]  [place] [address]

## 2010-11-06 NOTE — H&P (Signed)
[name], [name] NO.:  [id number]   MEDICAL RECORD NO.:  [id number]          PATIENT TYPE:  OBV   LOCATION:  1527                         FACILITY:  [facility]   PHYSICIAN:  Hollice Espy, M.D.DATE OF BIRTH:  1951/03/31   DATE OF ADMISSION:  03/31/2007  DATE OF DISCHARGE:                              HISTORY & PHYSICAL   ATTENDING PHYSICIAN:  Valerie A. Felicity Coyer, MD.   PRIMARY CARE PHYSICIAN:  Tera Mater. Clent Ridges, MD.   SURGEON:  Dr. Wenda Low.   CHIEF COMPLAINTS:  Abdominal pain.   HISTORY OF PRESENT ILLNESS:  The patient is a 60 year old white female  with past medical history of heavy tobacco abuse and gastric outlet  obstruction who underwent a Nissen fundoplication in June of this year.  She says that since that time, she has not felt right with complaints of  abdominal pain persisting usually in the left to middle upper quadrant,  but in last 24-48 hours, pain seems to have migrated down more to the  lower areas and she started having some increased nausea and vomiting.  She came into the emergency room.  Labs were checked on the patient and  found to be relatively unremarkable.  She had a CT of the abdomen and  pelvis done which showed signs of no full obstruction but edema and  inflammation around the duodenal area concerning for a possible duodenal  stenosis and perhaps low grade obstruction.  A small bowel follow-  through was recommended.  Given the length of time for the small bowel  follow-through, it was felt best the patient come in for 24-hour  observation and starting of these tests.  The patient was given  medication for pain and nausea and she is starting to feel a little bit  better.  She currently denies any headaches, vision changes, dysphagia,  chest pain, palpitations, shortness of breath, wheeze, cough.  She  complains of abdominal pain as above.  No hematuria or dysuria.  No  constipation or diarrhea.  No focal extremity numbness,  weakness or  pain.   REVIEW OF SYSTEMS:  Otherwise negative.   PAST MEDICAL HISTORY:  1. Heavy tobacco abuse.  2. Depression.  3. History of gastric outlet obstruction, status post Nissen      fundoplication.  4. Nissen fundoplication.  5. Gastrojejunostomy.   MEDICATIONS:  1. Phenergan p.r.n.  2. Paxil 20 daily.  3. Vicodin p.r.n.  4. Reglan 10 four times a day.  5. Xanax p.r.n.   ALLERGIES:  NO KNOWN DRUG ALLERGIES.   SOCIAL HISTORY:  She smokes about 2 packs a day.  Denies any alcohol or  drug use.   FAMILY HISTORY:  Noncontributory.   PHYSICAL EXAMINATION:  VITAL SIGNS:  On admission temperature 97, heart  rate 100, blood pressure 125/76, respirations 17, O2 sat 96 on room air.  GENERAL APPEARANCE:  She is alert and oriented x3 in no apparent  distress.  HEENT:  Normocephalic, atraumatic.  Mucous membranes are slightly dry.  She has no carotid bruits.  HEART: Regular rate and rhythm.  S1-S2, a 2/6 systolic  ejection murmur.  LUNGS:  Clear to auscultation bilaterally.  ABDOMEN:  Firm, distended on approximately the left side of her abdomen,  mostly in the lower quadrant, but also in the upper quadrant.  She has  signs of erythema ab igne, which the condition of skin change from  constant exposure to ultraviolet radiation, which the patient has from  keeping a heating pad constantly pressed against this side of the  abdomen.  She has signs of this on her right lower quadrant but this is  very faint, given the fact that she no longer presses it on that side.  EXTREMITIES:  No clubbing, cyanosis or edema.   LABORATORY DATA:  A UA shows small leukocyte esterase but otherwise  unremarkable.  White count 8.7 no shift, H&H 13.2 and 38, MCV 89,  platelet count 405.  Sodium 134, potassium 3.7, chloride 97, bicarb 27,  BUN 5, creatinine 0.6, glucose 163.  Albumin is noted to be at 2.8.  lipase 13.   ASSESSMENT/PLAN:  1. Abdominal pain with patient status post Nissen  fundoplication.      Which check an upper gastrointestinal series with small-bowel      follow-through.  Depending on findings, may need to consult with      Dr. Daphine Deutscher versus gastroenterology.  In the  meantime, keep n.p.o.,      put on IV Protonix and Reglan.  2. Depression, holding Paxil until she is able to take p.o.  3. Tobacco abuse.  The patient would like a nicotine patch.      Hollice Espy, M.D.  Electronically Signed     SKK/MEDQ  D:  04/01/2007  T:  04/01/2007  Job:  [id number]   cc:   [place] Daphine Deutscher, MD  [address], [address]  [place]  [place] [address]   Tera Mater. Clent Ridges, MD  [address] [place]  [place] [address]

## 2010-11-06 NOTE — Discharge Summary (Signed)
NAMEHAIZEL, GATCHELL NO.:  [id number]   MEDICAL RECORD NO.:  [id number]          PATIENT TYPE:  INP   LOCATION:  1527                         [facility]AC[facility]   PHYSICIAN:  Gordy Savers, MDDATE OF BIRTH:  [birth year]   DATE OF ADMISSION:  03/31/2007  DATE OF DISCHARGE:  04/03/2007                               DISCHARGE SUMMARY   FINAL DIAGNOSES:  1. Obstructive gastric outlet obstruction.  2. Large prepyloric ulcer.   ADDITIONAL DIAGNOSES:  History of asthma.   DISCHARGE MEDICATIONS:  1. Prevacid 30 mg b.i.d.  2. Reglan 10 mg q.i.d.  3. Nicotine patch 21 mg daily.  4. Dilaudid 2 mg one q.8 h. p.r.n. pain.   HISTORY OF PRESENT ILLNESS:  The patient is a 60 year old female status,  status post remote perforation of a duodenal ulcer, status post Cheree Ditto  patch replacement who was admitted for evaluation of worsening abdominal  pain.  Her previous perforated ulcer occurred in 2006, and she had a  history of gastric outlet obstruction in June of 2008.  The patient has  continued use of tobacco, anti-inflammatory drugs, and caffeine.  She  underwent a Nissen fundoplication, vagotomy, and gastrojejunostomy in  July of 2008 by Dr. Daphine Deutscher and had a benign postop course.  The patient  was admitted with constant abdominal pain, nausea, but no vomiting.   LABORATORY DATA AND HOSPITAL COURSE:  The patient was seen in  consultation by the GI service who performed upper panendoscopy.  This  revealed mild esophageal inflammation. There was retained food in the  stomach, and a stenosis in the duodenal bulb.  There was a benign-  appearing gastric ulcer in the antrum at the approximate 2 o'clock  position.   Postoperatively the patient did well.  She was tolerating a clear liquid  diet after the procedure, and her pain seemed well controlled on  analgesics.  Chest x-ray and acute abdominal series unremarkable except  for a nonobstructive bowel gas pattern.  CT of  the pelvis with contrast  revealed no acute pelvic CT findings.   DISPOSITION:  The patient was discharged today with followup with her  primary care Sakina Briones, Dr. Daphine Deutscher, next week.  The patient will also be  considered for repeat upper endoscopy in 2-3 weeks.   DISCHARGE MEDICATIONS WILL INCLUDE:  1. Dilaudid 2 mg q.6 h. p.r.n. pain.  2. Prevacid 30 mg b.i.d.  3. Reglan 10 mg q.6 h. p.r.n.  4. Nicotine patch daily.  5. She was advised to avoid aspirin and all nonsteroidal anti-      inflammatory drugs.   CONDITION ON DISCHARGE:  Stable.      Gordy Savers, MD  Electronically Signed     PFK/MEDQ  D:  04/03/2007  T:  04/03/2007  Job:  [id number]

## 2010-11-06 NOTE — Letter (Signed)
April 30, 2007     RE:  [name], [name]  MRN:  [id number]  /  DOB:  29-Jan-1951   To Whom It May Concern:   This letter is concerning a patient of mine by the name of [name] (date of birth 03/09/1951).  I have served as this patient's  primary care physician since I met her on March 03, 2003.  Over the  past several years, she has had increasingly severe abdominal problems,  including gastroesophageal reflux disease, erosive esophagitis, peptic  ulcers, and gastric outlet obstructions.  She also sees Dr. Lina Sar,  as gastroenterologist, in my group.  She has had frequent procedure  performed over the past several years, including upper endoscopies, and  in July of this year, she underwent a laparoscopic Nissen  fundoplication, vagotomy, and gastrojejunostomy per Dr. Luretha Murphy.  She has been on longterm medications, including narcotics for control of  her chronic abdominal pain, also promotility agents, and acid inhibitor  medications.  She lives with abdominal pain on a daily basis, and often  has episodes of nausea and vomiting, which may last for days at a time.  I feel she has reached the maximum amount of improvement that she will  probably ever reach with these therapies.  She has now filed for  disability status because of inability to perform gainful employment on  a regular basis, and requiring numerous prolonged periods of absence  from work.   This letter is to certify my medical opinion as her primary care  physician that indeed she is totally disabled from any form of gainful  employment.  I feel there is no significant chance at  rehabilitation or improvement.  I am sure the specialists mentioned  above agree with this assessment.   If I may be of further assistance, please let me know.    Sincerely,      Tera Mater. Clent Ridges, MD  Electronically Signed    SAF/MedQ  DD: 04/30/2007  DT: 04/30/2007  Job #: [id number]

## 2010-11-07 ENCOUNTER — Encounter: Payer: Self-pay | Admitting: Internal Medicine

## 2010-11-07 ENCOUNTER — Ambulatory Visit (INDEPENDENT_AMBULATORY_CARE_PROVIDER_SITE_OTHER)
Admission: RE | Admit: 2010-11-07 | Discharge: 2010-11-07 | Disposition: A | Discharge status: Home / Self Care | Payer: Medicare Other | Source: Ambulatory Visit | Attending: Internal Medicine | Admitting: Internal Medicine

## 2010-11-07 ENCOUNTER — Ambulatory Visit (INDEPENDENT_AMBULATORY_CARE_PROVIDER_SITE_OTHER): Payer: Medicare Other | Admitting: Internal Medicine

## 2010-11-07 VITALS — BP 130/80 | HR 116 | Temp 98.4°F | Ht 63.0 in | Wt 116.0 lb

## 2010-11-07 DIAGNOSIS — J45909 Unspecified asthma, uncomplicated: Secondary | ICD-10-CM

## 2010-11-07 DIAGNOSIS — J189 Pneumonia, unspecified organism: Secondary | ICD-10-CM

## 2010-11-07 DIAGNOSIS — J449 Chronic obstructive pulmonary disease, unspecified: Secondary | ICD-10-CM | POA: Insufficient documentation

## 2010-11-07 HISTORY — DX: Pneumonia, unspecified organism: J18.9

## 2010-11-07 NOTE — Patient Instructions (Signed)
Congratulations on not smoking - it's the most important aspect of your care.  If you have no symptoms, you need no medications so just use the nebulizer as needed  Please schedule a follow up office visit in 6 weeks, call sooner if needed pft's

## 2010-11-07 NOTE — Telephone Encounter (Signed)
Left message on machine for patient

## 2010-11-07 NOTE — Assessment & Plan Note (Signed)
I reviewed the Flethcher curve with patient that basically indicates  if you quit smoking when your best day FEV1 is still well preserved it is highly unlikely you will progress to severe disease and informed the patient there was no medication on the market that has proven to change the curve or the likelihood of progression.  Therefore stopping smoking and maintaining abstinence is the most important aspect of care, not choice of inhalers or for that matter, doctors.    I believe she's only a GOLD II at most so just using prn saba ok for now as long as maintains off cigarettes

## 2010-11-07 NOTE — Assessment & Plan Note (Signed)
No further f/u needed.

## 2010-11-07 NOTE — Progress Notes (Signed)
59 yowf quit smoking on admit with pna [facility]  09/2010 with R parapneumonic effusion tapped 10/05/10 > resolved    11/07/2010 Arita Severtson/ ov Initial pulmonary office eval in EMR era. Back to baseline sob not using neb at all now. Sleeping ok without nocturnal  or early am exac of resp c/o's or need for noct saba. Not limited by sob   Pt denies any significant sore throat, dysphagia, itching, sneezing,  nasal congestion or excess/ purulent secretions,  fever, chills, sweats, unintended wt loss, pleuritic or exertional cp, hempoptysis, orthopnea pnd or leg swelling.    Also denies any obvious fluctuation of symptoms with weather or environmental changes or other aggravating or alleviating factors.         Physical exam amb thin wf nad  Wt 116 11/07/2010 HEENT mild turbinate edema.  Oropharynx no thrush or excess pnd or cobblestoning.  No JVD or cervical adenopathy. Mild accessory muscle hypertrophy. Trachea midline, nl thryroid. Chest was hyperinflated by percussion with diminished breath sounds and moderate increased exp time without wheeze. Hoover sign positive at mid inspiration. Regular rate and rhythm without murmur gallop or rub or increase P2 or edema.  Abd: no hsm, nl excursion. Ext warm without cyanosis or clubbing.     cxr 11/07/2010  Flattening of diaphragm on lateral image with overall hyperinflation configuration. Interval resolution of right pleural effusion and right basilar atelectasis and infiltrate. No acute process is evident. Stable chronic findings are detailed above.

## 2010-11-09 ENCOUNTER — Telehealth: Payer: Self-pay | Admitting: Internal Medicine

## 2010-11-09 NOTE — Discharge Summary (Signed)
NAMEARIANA, [name]                ACCOUNT NO.:  [id number]   MEDICAL RECORD NO.:  [id number]          PATIENT TYPE:  INP   LOCATION:  5738                         FACILITY:  [facility]   PHYSICIAN:  Thornton Park. Daphine Deutscher, MD  DATE OF BIRTH:  May 22, 1951   DATE OF ADMISSION:  08/29/2004  DATE OF DISCHARGE:  09/05/2004                                 DISCHARGE SUMMARY   DISCHARGE DIAGNOSES:  1.  Large anterior perforated prepyloric ulcer, status post primary closure      with Cheree Ditto patch by Dr. Luretha Murphy on August 30, 2004.  2.  Asthma.   HOSPITAL COURSE:  [name] is a 60 year old female who presented to the  emergency room with a 2-day history of upper abdominal pain.  X-ray, while  in the emergency room, revealed free intraabdominal air.  She was taken  emergently to the operating room and was found to have large anterior  perforated prepyloric ulcer.  Dr. Luretha Murphy then performed primary  closure with [facility] patch.  Al Pimple drain was placed and removed by  the time the patient was discharged.  The patient tolerated the procedure  well and over the next several days, her diet was gradually increased.   In addition, a gastroenterology consult was obtained through the care of Dr.  Marina Goodell.  He recommended no aspirin or NSAIDS.  He recommended that the  patient stop smoking and use a daily PPI indefinitely.  H. pylori was tested  for, however, this was negative.   On postop day #6, the patient was doing well.  She was eating without  difficulty and we recommended her discharge to home.   DISCHARGE MEDICATIONS:  1.  Singulair.  2.  Advair.  3.  Protonix 40 mg p.o. b.i.d.  4.  Vicodin 5/500 one to two tablets every 6 hours as needed for pain.   ACTIVITY:  No straining or lifting over 10 pounds x2 weeks.   DIET:  Remain on a low-fat diet.  No smoking.   WOUND CARE:  Clean over incisions gently with soap and water.   SPECIAL INSTRUCTIONS:  She is to call for any questions  or concerns at [phone].   FOLLOW UP:  A follow-up appointment with Dr. Daphine Deutscher will be scheduled for  the patient prior to her discharge.      LB/MEDQ  D:  09/05/2004  T:  09/05/2004  Job:  [id number]   cc:   Wilhemina Bonito. Marina Goodell, M.D. [facility]

## 2010-11-09 NOTE — Op Note (Signed)
[name], [name]                ACCOUNT NO.:  [id number]   MEDICAL RECORD NO.:  [id number]          PATIENT TYPE:  INP   LOCATION:  1843                         FACILITY:  [facility]   PHYSICIAN:  Thornton Park. Daphine Deutscher, MD  DATE OF BIRTH:  01/17/1951   DATE OF PROCEDURE:  08/30/2004  DATE OF DISCHARGE:                                 OPERATIVE REPORT   PREOPERATIVE DIAGNOSIS:  Free intraperitoneal air.   POSTOPERATIVE DIAGNOSIS:  Large anterior perforated prepyloric ulcer  (biopsied), with primary closure and Graham patch.   SURGEON:  Thornton Park. Daphine Deutscher, M.D.   ANESTHESIA:  General endotracheal.   DRAINS:  One Jackson-Pratt drain in the subhepatic space.   DESCRIPTION OF PROCEDURE:  [name] is a lady who presented to the [facility]  Emergency Room with a two-day history of abdominal pain.  X-rays showed free  air.  The patient was seen, and discussed laparotomy with her.   The patient was taken to room 16 on August 30, 2004 at approximately 03:00  hours and given general anesthesia.  A midline incision was made, and dirty  dishwater that was non-foul smelling was recovered in the order of several  hundred liters.  The hole in the duodenum was in a very indurated scarred-  down area of the prepyloric region.  I was able to put my finger into the  second and third portions of the duodenum.  However, there was posterior  ulcer present.  Because of the contamination, I felt resection was not in  order.  I elected to close this primarily with interrupted 2-0 silks and  then suture down omental patch over on top of this closure.  This was done  relatively easily.  I then continued with irrigation until I got clear  effluent.  The wound was then closed with running #1 Prolene from above and  below and closed with staples.  The patient seemed to tolerate the procedure  well and was taken to the recovery room in satisfactory condition.      MBM/MEDQ  D:  08/30/2004  T:  08/30/2004  Job:   [id number]   cc:   Stacie Glaze, M.D. [facility]

## 2010-11-09 NOTE — Assessment & Plan Note (Signed)
[facility]                         GASTROENTEROLOGY OFFICE NOTE   [name], [name]                       MRN:          [id number]  DATE:09/24/2006                            DOB:          17-Jan-1951    [name] is a 59 year old white female with chronic gastric outlet  obstruction and aspirin induced chronic peptic ulcer disease requiring  intermittent hospitalization.  She had a perforated ulcer in March 2006  and underwent a laparotomy and grahm patch placement in her perforated  duodenal bowel.  She also was found to have Barrett's esophagus from her  last endoscopy in October 2007, which showed social detained food at the  gastric outlet.  [name] has been complaining of increasing fullness,  early satiety,  and nausea consistent with recurrent gastric outlet  obstruction.  We have put her on carafate slurry  for the past 3 days  with some improvement of her symptoms.   MEDICATIONS:  1. Prevacid 30 mg p.o. daily.  2. Lorazepam and Vicodin p.r.n.  3. Phenergan 25 mg p.r.n.  4. The patient also is taking aspirin containing compounds despite of      [place] telling her not to do that.   PHYSICAL EXAMINATION:  Blood pressure 92/62, pulse 84 and weight 130  pounds.  She was alert and oriented.  LUNGS:  Clear to auscultation.  HEART:  With normal S1, S2.  ABDOMEN:  Patchy skin pigmentations due to a heating pad.  There was  tenderness in the epigastrium, no succussion splash , bowel sounds were  normal.  Actually, there was minimal tenderness in epigastrium today,  although abdomen was normal.   IMPRESSION:  36. A 60 year old white female with intermittent gastric outlet      obstruction due to chronic peptic ulcer disease status post prior      perforation of  duodenal ulcer necessitating placement of grahm      patch.  2. Continued use of aspirin products.  3. Smoker.  4. Barrett's esophagus.   PLAN:  1. Upper GI screen to assess  gastric outlet, to establish if patient      would be a candidate for gastrojejunostomy.  2. Stay on liquids for the next 24-48 hours.  3. Continue Prevacid 30 mg p.o.  4. Carafate slurry 1 tablesp  q.i.d.  5. Refills for Phenergan 25 mg.     [name]. Juanda Chance, MD  Electronically Signed    DMB/MedQ  DD: 09/24/2006  DT: 09/24/2006  Job #: [id number]   cc:   Tera Mater. Clent Ridges, MD

## 2010-11-09 NOTE — Progress Notes (Signed)
Quick Note:  Spoke with pt and notified of results per Dr. Wert. Pt verbalized understanding and denied any questions.  ______ 

## 2010-11-09 NOTE — Discharge Summary (Signed)
[name], [name]                ACCOUNT NO.:  [id number]   MEDICAL RECORD NO.:  [id number]          PATIENT TYPE:  INP   LOCATION:  1535                         FACILITY:  [facility]   PHYSICIAN:  Thornton Park. Daphine Deutscher, MD  DATE OF BIRTH:  [birth year]   DATE OF ADMISSION:  12/24/2006  DATE OF DISCHARGE:  12/28/2006                               DISCHARGE SUMMARY   ADMISSION DIAGNOSES:  Gastroesophageal reflux disease with Barrett's  esophagus, gastric outlet obstruction status post perforated ulcer.   PROCEDURE:  On July 2, laparoscopic truncal vagotomy, laparoscopic  Nissen fundoplication, laparoscopic gastrojejunostomy.   COURSE IN HOSPITAL:  [name] is a 60 year old lady, previously  treated by me for perforated gastric ulcer.  She has refused to stop  smoking.  She came in for the aforementioned procedure, and this was  performed.  She had the procedure done and on July 3 had an upper GI  which showed that it  emptied although slowly.  She had a good intact  Nissen.  She slowly improved and was ready for discharge by Dr.  Zachery Dakins on December 28, 2006.  She was taking a good full liquid diet at  that point and was scheduled for followup in the office in 3-4 weeks.   CONDITION:  Improved.      Thornton Park Daphine Deutscher, MD  Electronically Signed     MBM/MEDQ  D:  02/10/2007  T:  02/10/2007  Job:  [id number]

## 2010-11-09 NOTE — Assessment & Plan Note (Signed)
[facility]                           GASTROENTEROLOGY OFFICE NOTE   NAME:[name] [name]                       MRN:          [id number]  DATE:04/09/2006                            DOB:          Oct 26, 1950    [name] [name] is a 60 year old white female with chronic peptic ulcer disease,  gastric outlet obstruction.  GI bleed, status post perforated duodenal  ulcer.  Status post repair with gram patch in March 2006.  She is a smoker.  On last endoscopy in April 2007 the patient was found to have Barrett's  esophagus and persistent gastric ulcer.  She comes today with new onset of  solid food dysphagia which has developed since April of this year, mostly to  solids, but occasional regurgitation of the food.  She also has intermittent  vomiting of the food within 1 hour to 4 hours after eating.  She has been on  Prevacid 30 mg every morning, Pepcid 40 mg at bedtime, and Maalox in place  of Carafate which she ran out of.  She continues to smoke.   PHYSICAL EXAMINATION:  Blood pressure 118/68, pulse 80, and weight 138  pounds, stable.  LUNGS:  Clear to auscultation.  COR:  Normal S1-S2.  ABDOMEN:  With marked skin changes of the heating pad with hyperpatchy,  hyperpigmentation.  Bowel sounds were normoactive.  There was no tenderness  on the abdominal exam today.  EXTREMITIES:  No edema.  RECTAL EXAM:  Not done.   IMPRESSION:  30. A 60 year old white female with new onset solid food dysphagia such as      a benign esophageal stricture, last endoscopy in April 2007 showed the      hiatal hernia with the nonobstructing fibrous ring. She now has a      clinical significance for solid food dysphagia.  We may need esophageal      dilation.  2. Peptic ulcer disease, status post gram patch placement for perforated      peptic ulcer in March 2006.  Rule out gastric outlet obstruction.      Watch for recurrent ulcer.  She had an ulcer as of last endoscopy in  April 2007.  3. History of abuse of aspirin and NSAIDs.  Currently the patient denies      any use of them.   PLAN:  1. Increase Prevacid to 30 mg p.o. b.i.d.  2. New prescription for Carafate slurry 1 gram 4 tabs a day.  3. Upper endoscopy with esophageal dilatation, scheduled.       [name] [name]. Juanda Chance, MD   DMB/MedQ DD:  04/09/2006 DT:  04/10/2006 Job #:  [id number]   cc:   [name] Senior A. Clent Ridges, MD

## 2010-11-09 NOTE — Discharge Summary (Signed)
NAMESAFA, DERNER NO.:  [id number]   MEDICAL RECORD NO.:  [id number]          PATIENT TYPE:  IPS   LOCATION:  0404                          FACILITY[facility]  [facility]   PHYSICIAN:  Anselm Jungling, MD  DATE OF BIRTH:  [birth year]   DATE OF ADMISSION:  06/07/2007  DATE OF DISCHARGE:  06/11/2007                               DISCHARGE SUMMARY   IDENTIFYING DATA/REASON FOR ADMISSION:  This was an inpatient  psychiatric admission for [name], a 60 year old divorced white female,  unemployed, who stated that she was admitted after she fell, and the  paramedics were called.  The patient described a history of both  prescribed and unprescribed opiate analgesic use for pain, but states  that she had to have them.  She also had decided to stop smoking  recently, and had started taking Chantix 1 week prior to admission.  The  day prior to admission, she began having visual hallucinations.  It was  at this point that 911 was called in on her behalf.  She may have been  in a state of delirium, as the patient states I was talking funny.  Please refer to the admission note for further details pertaining to the  symptoms, circumstances and history that led to her hospitalization.  She was given an initial Axis I diagnosis of status post toxic delirium,  resolved, and opiate dependence.   MEDICAL AND LABORATORY:  The patient was medically and physically  assessed by the psychiatric nurse practitioner.  The patient complained  of chronic gastric and bowel problems related to motility issues.  It  was strongly suspected that she was describing gastrointestinal and  bowel symptoms related to on and off opiate withdrawal.   Other than this, the patient had no active medical problems.  There were  no significant medical issues.   HOSPITAL COURSE:  The patient was admitted to the adult inpatient  Psychiatric Service.  She presented as a well-nourished, well-developed  woman who was alert,  fully oriented, lucid, non-psychotic and non  delirious.  There were no signs or symptoms of psychosis or thought  disorder.  She indicated that she did not feel she needed to be  hospitalized any further.  She was pleasant, open, and non irritable.   The patient was placed on a clonidine detox protocol for opiate  dependence.  She tolerated this well, and on the third hospital day  reported that she was feeling good.  She had no overt signs or  symptoms of withdrawal, and no gastrointestinal or bowel related  complaints.  Eating and sleeping were stable.   There was a family session on the third hospital day involving the  patient and her mother.  Mother was very supportive.  This was a helpful  meeting.  Mother did indicate that she did not feel she could continue  to afford to support her daughter.   On the fourth hospital day, the patient continued to report very good  sleep, but noted some bowel hypermotility, but not associated with any  gastrointestinal pain, discomfort or distress.  She looked  bright and  pleasant.   The following day she was discharged home with her mother.  She was  absent any signs or symptoms of withdrawal at that time.  She was in  good spirits.  She agreed to following aftercare plan.   AFTERCARE:  The patient was to follow-up with [facility]  [place] with an appointment there on June 23, 2007.   DISCHARGE MEDICATIONS:  Paxil 80 mg daily, Reglan 10 mg q.i.d. and  Carafate 1 g q.i.d..  The patient was instructed to follow-up with her  family doctor for her medical issues.   DISCHARGE DIAGNOSES:  AXIS I: Opiate dependence, early remission and  status post toxic delirium.  AXIS II: Deferred.  AXIS III: No acute or chronic illnesses.  AXIS IV: Stressors severe.  AXIS V: GAF on discharge 60.      Anselm Jungling, MD  Electronically Signed     SPB/MEDQ  D:  07/02/2007  T:  07/03/2007  Job:  [id number]

## 2010-11-09 NOTE — H&P (Signed)
NAMEEDDIS, PINGLETON NO.:  [id number]   MEDICAL RECORD NO.:  [id number]          PATIENT TYPE:  INP   LOCATION:  5732                         FACILITY:  [facility]   PHYSICIAN:  Hedwig Morton. Juanda Chance, MD     DATE OF BIRTH:  06-22-1951   DATE OF ADMISSION:  04/17/2006  DATE OF DISCHARGE:                                HISTORY & PHYSICAL   PRIMARY CARE PHYSICIAN:  Dr. Gershon Crane.   CHIEF COMPLAINT:  Epigastric pain, dysphagia, vomiting and weight loss.   HISTORY OF PRESENT ILLNESS:  Ms. Quiocho is a 60 year old white female with  history of chronic duodenal ulcer disease.  Looking through the records, an  EGD as far back as January of 2003 showed a duodenal ulcer; it was still  there in February of 2004.  The ulcers were secondary to chronic use of  aspirin and nonsteroidal products.  In March of 2006, she presented with a  perforated prepyloric ulcer and underwent a primary closure with Cheree Ditto  patch by Dr. Daphine Deutscher on August 30, 2004.  Her latest followup endoscopy was  April of 2007, where she was found to have Barrett's esophagus and  persistent gastric ulcer.   On October 17, the patient was seen by Dr. Juanda Chance in the office complaining  of solid food dysphagia, persistent since April 2007.  This was mostly to  solids and she occasionally regurgitates food.  For the last several weeks,  she has had intermittent postprandial vomiting of food 1-4 hours after  meals.  She has been on chronic Prevacid every morning.  Dr. Juanda Chance added  Pepcid 40 mg at bedtime and substituted Carafate with Maalox.  The patient  does have trouble getting medications because of insurance issues and  medication cost.  The Pepcid did not help much, neither did Phenergan for  nausea or vomiting.  She switched the Pepcid out for a second dose of  Prevacid at bedtime.  She continues to have postprandial nausea and  vomiting.  She denies use of aspirin or nonsteroidal products, but does  continue to  smoke about 2 packs a day.  She also relates life stress because  she just started a new supervisory job about 2 weeks ago and many  responsibilities associated with this.   The patient was endoscoped this morning by Dr. Juanda Chance and this study showed  grade C erosive esophagitis, retained food in the stomach and a slit-like  opening next to a large antral ulcer.  This was a maximum of 15 mm in  circumference with necrotic appearance and bled easily, once touch with the  endoscope.  This area was biopsied.  Dr. Juanda Chance was unable to get the scope  beyond the very small opening next of this ulcer.  Dr. Juanda Chance is admitting  the patient for supportive care for treatment of a gastric outlet  obstruction.   ALLERGIES:  None.   MEDICATIONS:  1. Prevacid 30 mg b.i.d.  2. Celexa dose not known, once daily.  3. Phenergan 25 mg p.o. p.r.n.; she uses this rarely.  4. Lorazepam, dose unknown, p.r.n.  use for anxiety; she uses this rarely.  5. Vicodin, dose unknown, occasional p.r.n. use.   PAST MEDICAL HISTORY:  1. Asthma.  2. Chronic peptic ulcer disease, status post patch repair of perforated      duodenal ulcer, March of 2006.  3.  Chronic tobacco habituation.  3. Status post tonsillectomy.   SOCIAL HISTORY:  The patient is a Runner, broadcasting/film/video.  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  She has been on the job for 2 weeks.  She smokes 2 packs of  cigarettes a day.  She drinks alcohol in moderation, maybe once or twice a  year.  She lives alone and is unmarried.   FAMILY HISTORY:  Father died at age 78 with pancreatic cancer.  He also had  a history of ulcers.  Both parents have had renal cell carcinoma.  Her  mother has recently undergone stenting to coronary artery and last year had  2 malignant bladder tumors resected.   REVIEW OF SYSTEMS:  NEUROLOGIC:  No headaches.  No visual changes.  CARDIOVASCULAR:  Mid-sternal  pain.  No radiation of the pain.  PULMONARY:  Sometimes has a cough; it is nonproductive.  NEUROLOGIC:  No tremors.  No  unilateral weakness.  GENERAL:  Has had unspecified weight loss.  Her weight  on October 17 was 138 pounds.  Denies history of hypertension.  DERMATOLOGIC:  No problems with nonhealing rashes.  No pruritus.  GU: No  incontinence and no frequency.  PSYCHIATRIC:  Anxiety.  Denies depression or  tearfulness.  All other systems reviewed and negative.   PHYSICAL EXAM:  VITAL SIGNS:  Blood pressure 110/70, temperature 97.7, pulse  62, respirations 20, weight is 138.8 pounds..  GENERAL:  The patient is a pleasant, anxious white female who looks old for  her age.  HEENT:  Sclerae anicteric.  Conjunctivae are pink.  Extraocular movements  intact.  Oropharynx is moist and clear.  No lesions or exudates.  NECK:  No JVD, no masses, no thyromegaly.  CHEST:  Clear to auscultation and percussion bilaterally.  Her voice has a  somewhat hoarse vocal quality.  CO:  There is regular rate and rhythm, no murmurs, rubs or gallops.  ABDOMEN:  Soft with epigastric tenderness.  No guarding, no rebound.  No  hepatosplenomegaly, no masses.  Bowel sounds are active.  She is  nondistended.  RECTAL EXAM, GU AND BREAST EXAMS:  Deferred.  DERMATOLOGIC:  The patient's skin looks like she has had a lot of sun  exposure and is prematurely aged.  Do not see any suspicious lesions on the  trunk, arms, legs.  EXTREMITIES:  No cyanosis, clubbing or edema.  NEUROLOGIC:  She moves all 4's.  Grip strength and pedal strength are 5/5  bilaterally.  No tremor.  PSYCHIATRIC:  Anxious, appropriate, pleasant.   IMPRESSION:  1. Recurrent/persistent gastric ulcer disease.  Currently, the patient      denies use of aspirin products, but does continue to smoke.  Biopsies      are pending from this morning's upper endoscopy.  Patient now admitted     with gastric outlet obstruction secondary to recurrent ulcer.   Will      support medically.  2. Anxiety.  3. Chronic tobacco habituation.   PLAN:  Patient admitted to Dr. Regino Schultze service for NG tube suction.  Supportive medications with p.r.n. Dilaudid, Phenergan and reinitiation of  Carafate  slurry and Protonix drip.  Will plan to check basic labs and also  check a gastrin level.  IV fluid support while the patient is n.p.o.     ______________________________  Jennye Moccasin, PA-C      Hedwig Morton. Juanda Chance, MD  Electronically Signed    SG/MEDQ  D:  04/17/2006  T:  04/18/2006  Job:  [id number]

## 2010-11-09 NOTE — Telephone Encounter (Signed)
Spoke with pt and notified of results per Dr. Wert. Pt verbalized understanding and denied any questions. 

## 2010-11-09 NOTE — Discharge Summary (Signed)
NAMEVERNA, DESROCHER                ACCOUNT NO.:  [id number]   MEDICAL RECORD NO.:  [id number]          PATIENT TYPE:  INP   LOCATION:  5732                         FACILITY:  [facility]   PHYSICIAN:  Wilhemina Bonito. Marina Goodell, MD      DATE OF BIRTH:  [birth year]   DATE OF ADMISSION:  04/17/2006  DATE OF DISCHARGE:  04/22/2006                                 DISCHARGE SUMMARY   ADMITTING DIAGNOSES:  1. Recurrent, possibly persistent gastric ulcer disease.  Prior history of      ulcer disease secondary to chronic aspirin use which patient currently      denies.  She does smoke or which may be the ulcer risk factor.      Biopsies pending.  2. Gastric outlet obstruction secondary to recurrent ulcers.  3. Anxiety.  4. Chronic tobacco habituation.  5. Status post patch repair of perforated duodenal ulcer March 2006.  6. History of asthma.  7. Status post tonsillectomy.   DISCHARGE DIAGNOSIS:  1. Barrett's esophagus.  2. Gastric outlet obstruction secondary to recurrent peptic ulcer disease,      outlet obstructive symptoms resolved.  3. Anxiety.  4. Hypokalemia, corrected.   PROCEDURES:  Upper endoscopy just prior to admission on April 17, 2006;  study showing reflux esophagitis.  Biopsies from the esophagus did show  Barrett's esophagus changes, gastric ulcer at the antrum, retained food also  noted.  Endoscopy #2 performed on October29 by Dr. Yancey Flemings showing  moderate esophagitis, antral ulcer and no limitation to scope passage into  the duodenum.   CONSULTATIONS:  None.   BRIEF HISTORY:  Ms. Rumsey is a 60 year old white female well known to Dr.  Juanda Chance because of a history of recurrent ulcer disease.  In the past, she  has been a chronic abuser of aspirin which she currently denies.  She does  smoke two packs a day.  For several weeks, the patient has had postprandial  vomiting anywhere from one to four hours following meals.  This was  occurring despite daily Prevacid and more recent  addition of Pepcid 40 mg at  h.s. and Carafate.  She continued to have symptoms, so Dr. Juanda Chance endoscoped  her on October25 and found retained food, ulcers at the gastric antrum  and esophagitis.  She admitted her with a gastric outlet obstruction.   LABORATORY DATA:  Hemoglobin 12.4 on admission, 11.6 at discharge.  Hematocrit 34.5 at discharge.  Platelets 269,000.  White blood cell count  6.9.  Differential was within normal limits.  PT 14, INR 1.1, PTT 30.  Sodium 139, potassium nadir of 3.1 - corrected to 4.1.  Chloride 109, CO2  24.  Glucose ranging 85 to 118.  BUN 4, creatinine 0.5.  Albumin 3.2.  Total  protein 5.4.  Total bilirubin 0.7, alkaline phosphatase 86, AST 13, ALT 10.  Lipase 19.  Serum gastrin level 57.  Urinalysis negative.  Helicobacter  urease testing negative.   IMAGING STUDIES:  Acute abdominal series with chest film negative for  obstruction, free air or cardiopulmonary changes.  CT scan of the abdomen  with contrast showed mild inflammatory edematous changes at the gastric  antrum and pylorus with associated gastric distension.   HOSPITAL COURSE:  1. Gastric outlet obstruction and recurrent gastric ulcer disease.  The      patient was started on IV antiemetics as well as Dilaudid.  She      received oral Carafate and Protonix drip.  NG tube was placed to low      intermittent suction.  The NG tube was putting out significant amounts      of fluid, and on the October29,she underwent a relook upper      endoscopy.  At that point, the antral ulcer was 10 mm in size      __________ 600 the next day and 900 mL the following day.  The      patient's abdominal pain resolved.  She started having loose stools,      and by hospital __________ compared with 15 mm on Dr. Juanda Chance prior      endoscopy.  This was clean based and located in an area of deformity      and partial obstruction.  Dr. Marina Goodell was able to easily pass the scope      into the duodenum.  The NG tube was  not replaced.  Over the next 24      hours, the patient was having loose stools, minimal abdominal pain and      was asking if she could go home.  Diet was full liquids at this point.      The patient knows the drill as far as treatment of gastric outlet      obstruction as she has had these before.  She knows to stay on liquids      and then slowly add back solid foods.  She had a follow-up appointment      arranged to see Dr. Juanda Chance on [place].  She was reminded to stop      ibuprofen or any type of aspirin and headache powders.  Soft diet was      recommended, and she was advised to continue to not smoke.   Condition at discharge was improved.   MEDICATIONS AT DISCHARGE:  1. Prevacid 30 mg twice daily.  2. Pepcid 40 mg p.o. at h.s.  3. Celexa 20 mg p.o. daily.  4. Phenergan 25 mg p.r.n.  5. Lorazepam 0.5 to 1 mg q.6-8h. p.r.n.  6. Vicodin strength unknown p.r.n.      Jennye Moccasin, PA-C      ______________________________  Wilhemina Bonito. Marina Goodell, MD    SG/MEDQ  D:  05/12/2006  T:  05/12/2006  Job:  [id number]   cc:   Hedwig Morton. Juanda Chance, MD  Tera Mater Clent Ridges, MD

## 2010-11-09 NOTE — H&P (Signed)
[name], [name]                ACCOUNT NO.:  [id number]   MEDICAL RECORD NO.:  [id number]          PATIENT TYPE:  INP   LOCATION:  1535                         FACILITY:  [facility]   PHYSICIAN:  Thornton Park. Daphine Deutscher, MD  DATE OF BIRTH:  [birth year]   DATE OF ADMISSION:  12/24/2006  DATE OF DISCHARGE:  12/28/2006                              HISTORY & PHYSICAL   CHIEF COMPLAINT:  Gastric outlet obstruction with history of intractable  ulcer disease, Barrett's esophagus with gastroesophageal reflux.   HISTORY:  This is a 60 year old white female who previously was cared  for by me for perforated ulcer and now comes back with gastric outlet  obstruction of a chronic nature do this peptic ulcer disease.  She had  upper GI done back in early 2008 showing retained food in the stomach as  well as an narrow gastric outlet after her duodenal ulcer perforation  and a Cheree Ditto  patch.  That was done in March 2006.  She continues to  smoke and abuse aspirin and Aleve and caffeine as well.  Sometimes she  drinks at least 10 Coca-Cola bottles a day and she smokes.  She refused  despite urging by her doctors and her mother and refused request to  improve her lifestyle.  She was seen in the office on several occasions  with her mother and we discussed options in and arrived at planning to  do a Nissen fundoplication gastric outlet obstruction procedure,  gastrojejunostomy and a total vagotomy.   PHYSICAL EXAM:  A thin white female, blood pressure 110/76, pulse 80,  weight 129 pounds.  HEAD normocephalic.  Eyes: sclerae nonicteric.  Pupils equal, round and  reactive to light.  Nose and throat exam unremarkable.  NECK: Supple.  CHEST: Was clear.  HEART: Sinus rhythm without murmurs or gallops.  ABDOMEN: She has some skin changes on her anterior abdominal wall and  striations that are  secondary to heating pad use.  EXTREMITIES have full range of motion.   IMPRESSION:  Gastric outlet obstruction with  Barrett's esophagus from  chronic reflux.      Thornton Park Daphine Deutscher, MD  Electronically Signed     MBM/MEDQ  D:  02/10/2007  T:  02/10/2007  Job:  [id number]

## 2010-11-09 NOTE — Assessment & Plan Note (Signed)
[facility]                         GASTROENTEROLOGY OFFICE NOTE   NAME:[name], [name]                       MRN:          [id number]  DATE:10/08/2006                            DOB:          09/27/1950    [name] comes for followup of gastric outlet obstruction. She is a 60-  year-old white female with chronic gastric outlet obstruction due to  benign disease. Upper GI series one week ago confirmed presence of  retained food in the stomach as well as narrow gastric outlet. She is  status post duodenal ulcer perforation and repair of perforated ulcer  with a [name] patch in March of 2006. She also has Barrett's esophagus.  She is a smoker. Abuses aspirin, Aleve. She abuses caffeine drinking at  least 10 Coca-Cola bottles a day and she smokes. She is not really  compliant with our request to improve her lifestyle. 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 Between last week and  today, she did well for about four days, but had recurrence of vomiting  after having supper three days ago.   PHYSICAL EXAMINATION:  Blood pressure 110/76, pulse 80 and weight is 129  pounds, which is 1 pound less than last week. She was in no distress.  LUNGS: Clear to auscultation.  COR: Normal S1, normal S2.  ABDOMEN: Was not examined today.   IMPRESSION:  A 60 year old white female with chronic gastric outlet  obstruction due to gastric ulcer and pyloric stenosis, status post prior  perforated ulcer, treated with [name] patch. She continues to not comply  with the lifestyle changes suggested to her by her doctors.   PLAN:  I have discussed again extensively with the patient and her  mother the need for her to stop smoking, stop taking aspirin and reduce  her caffeine intake. I have suggested that she undergo surgical  evaluation for gastric jejunostomy or Billroth II gastrojejunostomy to  relieve the chronic outlet obstruction. The other  options would be to  try balloon dilatation of the pyloric stenosis which has only variable  results and may not be permanent. At this point, the patient would  prefer not to have surgery, but to try balloon dilatation. If it does  not work, she will need likely consultation with Dr. Luretha [name], who  did her previous surgery.     [name]. [name] Chance, MD  Electronically Signed    DMB/MedQ  DD: 10/08/2006  DT: 10/08/2006  Job #: [id number]   cc:   [name] Senior A. Clent Ridges, MD

## 2010-11-09 NOTE — Op Note (Signed)
NAME:  [name], [name]                          ACCOUNT NO.:  [id number]   MEDICAL RECORD NO.:  [id number]                   PATIENT TYPE:  AMB   LOCATION:  ENDO                                 FACILITY:  [facility]   PHYSICIAN:  Lina Sar, M.D. [facility]               DATE OF BIRTH:  May 28, 1951   DATE OF PROCEDURE:  DATE OF DISCHARGE:                                 OPERATIVE REPORT   PROCEDURE:  Upper endoscopy.   HISTORY:  This 60 year old white female has a history of chronic peptic  ulcer disease and chronic abdominal pain. On last upper endoscopy in January  2003 she was found to have a large duodenal ulcer. She has continued to  abuse aspirin and NSAIDs although she was asked many times not to. She is on  the patient assistance program for Prevacid and has been taking it once a  day. On physical exam she has had Hemoccult negative stool, but has been  mildly anemic. She is a smoker who has been under a lot of stress. She does  not drink any alcohol. She is undergoing upper and lower endoscopy to  evaluate her abdominal pain.   ENDOSCOPE:  Fujinon single channel video endoscope.   SEDATION:  Versed 10 mg IV, Fentanyl 100 mcg IV.   UPPER ENDOSCOPY:   FINDINGS:  The Fujinon single channel video endoscope was passed through the  orus, through the oropharynx into the esophagus. The patient was monitored  by pulse oximeter; oxygen saturations were normal. She was anxious prior to  the procedure, but was quite cooperative during the procedure. Proximal and  mid esophageal mucosa was normal. There are long linear erosions at the GE  junction measuring at least 1 cm in length. There was no stricture, but the  mucosa was somewhat whitish indicating chronic reflux. There was no active  bleeding from the erosions. There was a small, easily reducible hiatal  hernia.   Stomach:  The stomach was insufflated with air and saw normal gastric folds  antrum and pyloric outlet. Retroflexion of  endoscope revealed normal fundus.   Duodenum:  The duodenal bulb was contracted and showed large, chronically  appearing duodenal ulcer measuring at least 1-2 cm in diameter. It was  rather deep and was covered with greenish blue material and white base. The  edges of the ulcer are clean and with no evidence of active bleeding from  the ulcer. The mucosa surrounding the ulcer was quite edematous and there  was a partial obstruction of the duodenal outlet in that it was difficult to  advance the endoscope into the descending duodenum, but we were able to do  that after some manipulation of the scope. Biopsies were taken from the  vicinity of the ulcer for CLOtest. The descending duodenum itself is normal.   IMPRESSION:  1. Grade 2 esophagitis.  2. Large chronically appearing duodenal ulcer with partial duodenal  outlet     obstruction.   PLAN:  1. The patient absolutely needs to stop taking any aspirin or NSAIDs at this     time.  2. Continue Prevacid 30 mg twice a day.  3. We will have to follow her symptomatology if the duodenal outlet     obstruction does not improve she may need surgical intervention. At this     point we will follow her in the office and consider doing an upper GI     series to assess the gastric emptying after about 4-6 weeks of therapy.   PROCEDURE:  Colonoscopy.   ENDOSCOPE:  Fujinon single channel videoscope.   SEDATION:  Additional Versed of 4 mg IV and Demerol 40 mg IV.   FINDINGS:  The Fujinon single channel videoscope was passed through the anus  into the rectum to the sigmoid colon. The patient was monitored by pulse  oximeter. Her oxygen saturations again were normal. Her prep was excellent.  Anal canal and alimentary canal was unremarkable. Sigmoid colon mucosa was  normal. The colonoscope passed easily through the sigmoid colon, descending  colon, splenic flexure, transverse colon, hepatic flexure and ascending  colon. Cecal parts showed ileocecal  valve reached without difficulty and  showed normal appearing mucosa. There was no polyps. The colonoscope was  retracted and then the colon decompressed. The patient tolerated the  procedure well.   IMPRESSION:  Normal colonoscopy to the cecum.   PLAN:  The patient's abdominal pain is related to severe peptic ulcer  disease, chronic duodenal ulcer, and reflux esophagitis. She was advised to  stop smoking, stop taking aspirin, and NSAIDs and continue on her Prevacid.  She was told to stay on a high fiber diet.                                               Lina Sar, M.D. [facility]    DB/MEDQ  D:  08/05/2002  T:  08/05/2002  Job:  [id number]

## 2010-11-12 ENCOUNTER — Other Ambulatory Visit: Payer: Self-pay | Admitting: Family Medicine

## 2010-11-13 ENCOUNTER — Other Ambulatory Visit: Payer: Self-pay | Admitting: *Deleted

## 2010-11-13 MED ORDER — DIVALPROEX SODIUM 250 MG PO DR TAB: 250.0000 mg | DELAYED_RELEASE_TABLET | Freq: Every day | ORAL | Status: DC

## 2010-11-14 ENCOUNTER — Telehealth: Payer: Self-pay | Admitting: Internal Medicine

## 2010-11-14 NOTE — Telephone Encounter (Signed)
Per documentation in Epic, Verlon Au has already spoke with pt and informed her of cxr results. Unsure what she is needing.  Called and spoke with pt.  Pt states she forgot that leslie already called her regarding her cxr results and stated to disregard this message. Therefore will sign off on message.

## 2010-11-14 NOTE — Telephone Encounter (Signed)
LMTCB

## 2010-11-15 ENCOUNTER — Ambulatory Visit (INDEPENDENT_AMBULATORY_CARE_PROVIDER_SITE_OTHER): Payer: Medicare Other | Admitting: Family Medicine

## 2010-11-15 ENCOUNTER — Encounter: Payer: Self-pay | Admitting: Family Medicine

## 2010-11-15 VITALS — BP 108/68 | HR 61 | Temp 98.7°F | Resp 14 | Wt 116.3 lb

## 2010-11-15 DIAGNOSIS — M25559 Pain in unspecified hip: Secondary | ICD-10-CM

## 2010-11-15 DIAGNOSIS — F419 Anxiety disorder, unspecified: Secondary | ICD-10-CM

## 2010-11-15 DIAGNOSIS — M810 Age-related osteoporosis without current pathological fracture: Secondary | ICD-10-CM

## 2010-11-15 DIAGNOSIS — F411 Generalized anxiety disorder: Secondary | ICD-10-CM

## 2010-11-15 MED ORDER — DIAZEPAM 10 MG PO TABS: 10.0000 mg | ORAL_TABLET | Freq: Three times a day (TID) | ORAL | Status: AC | PRN

## 2010-11-15 NOTE — Progress Notes (Signed)
  Subjective:    Patient ID: [name], female    DOB: [birth year], 60 y.o.   MRN: [id number]  HPI Here to follow up on anxiety. She has been taking 5 mg of Diazepam tid with little improvement, along with the usual Prozac. She is still nervous and shaky all the time. She asks for a handicapped placard for her car. She also asks me to set up another bone density test, since her last one was done 2 years ago.    Review of Systems  Constitutional: Negative.   Respiratory: Negative.   Cardiovascular: Negative.   Musculoskeletal: Positive for arthralgias.  Psychiatric/Behavioral: Positive for agitation. The patient is nervous/anxious.        Objective:   Physical Exam  Constitutional: She appears well-developed and well-nourished.       Walks with a limp  Cardiovascular: Normal rate, regular rhythm, normal heart sounds and intact distal pulses.   Pulmonary/Chest: Effort normal and breath sounds normal.  Psychiatric:       Alert, anxious           Assessment & Plan:  Increase Diazepam to 10 mg tid . Signed a handicapped form for her to take to the [facility]. Set up a DEXA

## 2010-11-16 ENCOUNTER — Telehealth: Payer: Self-pay | Admitting: Internal Medicine

## 2010-11-16 NOTE — Telephone Encounter (Signed)
Please advise.   Pt stated that it is time for her chest ct.  thanks

## 2010-11-17 NOTE — Telephone Encounter (Signed)
My records indicate none is needed and she will return in 4 weeks for follow up anyway so  we can certainly discuss it then. Her last cxr had cleared nicely.

## 2010-11-20 NOTE — Telephone Encounter (Signed)
[email or web address].Shamond Skelton, CMA  

## 2010-11-21 NOTE — Telephone Encounter (Signed)
Pt advised no ct needed at this time. [name], CMA

## 2010-11-21 NOTE — Telephone Encounter (Signed)
Patient calling back.   °

## 2010-11-23 ENCOUNTER — Ambulatory Visit
Admission: RE | Admit: 2010-11-23 | Discharge: 2010-11-23 | Disposition: A | Discharge status: Home / Self Care | Payer: Medicare Other | Source: Ambulatory Visit | Attending: Family Medicine | Admitting: Family Medicine

## 2010-11-27 ENCOUNTER — Ambulatory Visit (INDEPENDENT_AMBULATORY_CARE_PROVIDER_SITE_OTHER): Payer: Medicare Other | Admitting: Family Medicine

## 2010-11-27 ENCOUNTER — Encounter: Payer: Self-pay | Admitting: Family Medicine

## 2010-11-27 VITALS — BP 108/78 | HR 64 | Temp 97.8°F | Resp 16 | Wt 121.0 lb

## 2010-11-27 DIAGNOSIS — F411 Generalized anxiety disorder: Secondary | ICD-10-CM

## 2010-11-27 DIAGNOSIS — F419 Anxiety disorder, unspecified: Secondary | ICD-10-CM
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
## 2010-11-29 ENCOUNTER — Telehealth: Payer: Self-pay | Admitting: Family Medicine

## 2010-11-29 NOTE — Telephone Encounter (Signed)
Please see my note for the DEXA

## 2010-11-29 NOTE — Telephone Encounter (Signed)
Triage vm-----------saw Dr fry yesterday and she forgot to discuss her bone density report.

## 2010-11-30 NOTE — Telephone Encounter (Signed)
Pt states she cannot take Calcium due to constipation, and was on Fosamax before.

## 2010-11-30 NOTE — Telephone Encounter (Signed)
Spoke with patient.

## 2010-11-30 NOTE — Telephone Encounter (Signed)
Okay, then just do the best you can. Repeat a DEXA in 2 years

## 2010-12-04 ENCOUNTER — Telehealth: Payer: Self-pay | Admitting: *Deleted

## 2010-12-04 NOTE — Telephone Encounter (Signed)
Pt is calling and leaves a message she needs to speak to a nurse about her meds.  LMTCB

## 2010-12-05 NOTE — Telephone Encounter (Signed)
Saw Dr Clent Ridges last week.  Needs a refill on Diazepam but it is too early. Prefers a different med.

## 2010-12-06 ENCOUNTER — Encounter: Payer: Self-pay | Admitting: Family Medicine

## 2010-12-09 ENCOUNTER — Other Ambulatory Visit: Payer: Self-pay | Admitting: Family Medicine

## 2010-12-12 NOTE — Telephone Encounter (Signed)
Faxed to pharmacy to not refill early

## 2010-12-12 NOTE — Telephone Encounter (Signed)
No early refills. I prefer that she stay on Diazepam as we discussed

## 2010-12-17 ENCOUNTER — Encounter: Payer: Self-pay | Admitting: Internal Medicine

## 2010-12-17 ENCOUNTER — Telehealth: Payer: Self-pay

## 2010-12-17 NOTE — Telephone Encounter (Signed)
rx request for promethazine 25; pt request 50 mg and pt would like to increase qty to 60.  Please advise

## 2010-12-18 ENCOUNTER — Telehealth: Payer: Self-pay | Admitting: *Deleted

## 2010-12-18 MED ORDER — PROMETHAZINE HCL 25 MG PO TABS: 50.0000 mg | ORAL_TABLET | Freq: Four times a day (QID) | ORAL | Status: DC | PRN

## 2010-12-18 NOTE — Telephone Encounter (Signed)
rx sent in 

## 2010-12-18 NOTE — Telephone Encounter (Signed)
Already done

## 2010-12-18 NOTE — Telephone Encounter (Signed)
[facility] pharmacy states that Pt is requesting Promethazine Rx be increased to 50mg  and a quantity of #60 Please advise.

## 2010-12-18 NOTE — Telephone Encounter (Signed)
Okay, call in 50 mg to use q 6 hours prn nausea, #60 with 5 rf

## 2010-12-19 ENCOUNTER — Ambulatory Visit (INDEPENDENT_AMBULATORY_CARE_PROVIDER_SITE_OTHER): Payer: Medicare Other | Admitting: Internal Medicine

## 2010-12-19 ENCOUNTER — Encounter: Payer: Self-pay | Admitting: Internal Medicine

## 2010-12-19 DIAGNOSIS — J449 Chronic obstructive pulmonary disease, unspecified: Secondary | ICD-10-CM

## 2010-12-19 LAB — PULMONARY FUNCTION TEST

## 2010-12-19 NOTE — Patient Instructions (Signed)
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    If you are satisfied with your treatment plan let your doctor know and he/she can either refill your medications or you can return here when your prescription runs out.     If in any way you are not 100% satisfied,  please tell us.  If 100% better, tell your friends!

## 2010-12-19 NOTE — Progress Notes (Signed)
PFT done today. 

## 2010-12-19 NOTE — Progress Notes (Signed)
Subjective:     Patient ID: [name], female   DOB: 01-Oct-1950, 60 y.o.   MRN: [id number]  HPI  55 yowf quit smoking on admit with pna [facility]  09/2010 with R parapneumonic effusion tapped 10/05/10 > resolved    11/07/2010 [name]/ ov Initial pulmonary office eval in EMR era. Back to baseline sob rec Congratulations on not smoking - it's the most important aspect of your care.  If you have no symptoms, you need no medications so just use the nebulizer as needed  12/19/2010 ov/[name] cc not limited from desired activities, no cough. Sleeping ok without nocturnal  or early am exac of resp c/o's or need for noct saba.  Pt denies any significant sore throat, dysphagia, itching, sneezing,  nasal congestion or excess/ purulent secretions,  fever, chills, sweats, unintended wt loss, pleuritic or exertional cp, hempoptysis, orthopnea pnd or leg swelling.    Also denies any obvious fluctuation of symptoms with weather or environmental changes or other aggravating or alleviating factors.       .     Review of Systems     Objective:   Physical Exam Physical exam amb thin wf nad  Wt 116 11/07/2010 >  117 12/19/2010  HEENT mild turbinate edema.  Oropharynx no thrush or excess pnd or cobblestoning.  No JVD or cervical adenopathy. Mild accessory muscle hypertrophy. Trachea midline, nl thryroid. Chest was hyperinflated by percussion with diminished breath sounds and moderate increased exp time without wheeze. Hoover sign positive at mid inspiration. Regular rate and rhythm without murmur gallop or rub or increase P2 or edema.  Abd: no hsm, nl excursion. Ext warm without cyanosis or clubbing.     cxr 11/07/2010  Flattening of diaphragm on lateral image with overall hyperinflation configuration. Interval resolution of right pleural effusion and right basilar atelectasis and infiltrate. No acute process is evident. Stable chronic findings are detailed above    Assessment:         Plan:

## 2010-12-19 NOTE — Progress Notes (Deleted)
59 yowf quit smoking on admit with pna [facility]  09/2010 with R parapneumonic effusion tapped 10/05/10 > resolved    11/07/2010 Lyrick Lagrand/ ov Initial pulmonary office eval in EMR era. Back to baseline sob not using neb at all now. Sleeping ok without nocturnal  or early am exac of resp c/o's or need for noct saba. Not limited by sob   Pt denies any significant sore throat, dysphagia, itching, sneezing,  nasal congestion or excess/ purulent secretions,  fever, chills, sweats, unintended wt loss, pleuritic or exertional cp, hempoptysis, orthopnea pnd or leg swelling.    Also denies any obvious fluctuation of symptoms with weather or environmental changes or other aggravating or alleviating factors.         Physical exam amb thin wf nad  Wt 116 11/07/2010 HEENT mild turbinate edema.  Oropharynx no thrush or excess pnd or cobblestoning.  No JVD or cervical adenopathy. Mild accessory muscle hypertrophy. Trachea midline, nl thryroid. Chest was hyperinflated by percussion with diminished breath sounds and moderate increased exp time without wheeze. Hoover sign positive at mid inspiration. Regular rate and rhythm without murmur gallop or rub or increase P2 or edema.  Abd: no hsm, nl excursion. Ext warm without cyanosis or clubbing.     cxr 11/07/2010  Flattening of diaphragm on lateral image with overall hyperinflation configuration. Interval resolution of right pleural effusion and right basilar atelectasis and infiltrate. No acute process is evident. Stable chronic findings are detailed above.    

## 2010-12-19 NOTE — Progress Notes (Deleted)
.  .  .        xx

## 2010-12-20 ENCOUNTER — Encounter: Payer: Self-pay | Admitting: Internal Medicine

## 2010-12-20 NOTE — Assessment & Plan Note (Signed)
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  I had an extended discussion with the patient today lasting 15 to 20 minutes of a 25 minute visit on the following issues: I reviewed the Flethcher curve with patient that basically indicates  if you quit smoking when your best day FEV1 is still well preserved it is highly unlikely you will progress to severe disease and informed the patient there was no medication on the market that has proven to change the curve or the likelihood of progression.  Therefore stopping smoking and maintaining abstinence is the most important aspect of care, not choice of inhalers or for that matter, doctors.    Pulmonary f/u can therefore be prn

## 2010-12-25 ENCOUNTER — Encounter: Payer: Self-pay | Admitting: Internal Medicine

## 2011-01-01 ENCOUNTER — Other Ambulatory Visit: Payer: Self-pay | Admitting: Family Medicine

## 2011-01-01 NOTE — Telephone Encounter (Signed)
I called and left a voice message for pt. I explained that the doctor is out of the office this week and the script was wrote for #60. I advised to get filled and later we can ask about increasing the amount of tablets.

## 2011-01-03 ENCOUNTER — Other Ambulatory Visit: Payer: Self-pay | Admitting: Family Medicine

## 2011-01-03 NOTE — Telephone Encounter (Signed)
Pt last here on 11/15/10 and script last filled on 12/09/10

## 2011-01-04 NOTE — Telephone Encounter (Signed)
Ok to refill x 1  Any further refills per dr Clent Ridges

## 2011-01-16 ENCOUNTER — Ambulatory Visit (INDEPENDENT_AMBULATORY_CARE_PROVIDER_SITE_OTHER): Payer: Medicare Other | Admitting: Family Medicine

## 2011-01-16 ENCOUNTER — Encounter: Payer: Self-pay | Admitting: Family Medicine

## 2011-01-16 VITALS — BP 100/72 | HR 76 | Temp 98.4°F | Wt 125.0 lb

## 2011-01-16 DIAGNOSIS — K219 Gastro-esophageal reflux disease without esophagitis: Secondary | ICD-10-CM

## 2011-01-16 DIAGNOSIS — R1084 Generalized abdominal pain: Secondary | ICD-10-CM

## 2011-01-16 DIAGNOSIS — F411 Generalized anxiety disorder: Secondary | ICD-10-CM

## 2011-01-16 DIAGNOSIS — F419 Anxiety disorder, unspecified: Secondary | ICD-10-CM

## 2011-01-16 MED ORDER — ONDANSETRON HCL 8 MG PO TABS: 8.0000 mg | ORAL_TABLET | Freq: Three times a day (TID) | ORAL | Status: AC | PRN

## 2011-01-16 MED ORDER — HYDROCODONE-ACETAMINOPHEN 7.5-500 MG PO TABS: 1.0000 | ORAL_TABLET | Freq: Three times a day (TID) | ORAL | Status: AC | PRN

## 2011-01-16 MED ORDER — FLUOXETINE HCL 40 MG PO CAPS: 80.0000 mg | ORAL_CAPSULE | Freq: Every day | ORAL | Status: DC

## 2011-01-16 MED ORDER — ZOLPIDEM TARTRATE 10 MG PO TABS: 10.0000 mg | ORAL_TABLET | Freq: Every evening | ORAL | Status: DC | PRN

## 2011-01-16 MED ORDER — DIVALPROEX SODIUM 500 MG PO DR TAB: 500.0000 mg | DELAYED_RELEASE_TABLET | Freq: Two times a day (BID) | ORAL | Status: DC

## 2011-01-16 MED ORDER — ALPRAZOLAM 2 MG PO TABS: 2.0000 mg | ORAL_TABLET | Freq: Three times a day (TID) | ORAL | Status: AC | PRN

## 2011-01-16 NOTE — Progress Notes (Signed)
  Subjective:    Patient ID: [name], female    DOB: 1951-05-18, 60 y.o.   MRN: [id number]  HPI Here to discuss her meds. The Valium does not work nearly as well as the Xanax did for the anxiety. Phenergan does not work much any more for the nausea. She asks if the dose of her Vicodin can be increased a bit.    Review of Systems  Constitutional: Negative.   Respiratory: Negative.   Cardiovascular: Negative.   Gastrointestinal: Negative.   Psychiatric/Behavioral: Positive for sleep disturbance. The patient is nervous/anxious.        Objective:   Physical Exam  Constitutional: She appears well-developed and well-nourished.  Cardiovascular: Normal rate, regular rhythm, normal heart sounds and intact distal pulses.   Pulmonary/Chest: Effort normal and breath sounds normal.  Abdominal: Soft. Bowel sounds are normal. She exhibits no distension and no mass. There is no tenderness. There is no rebound and no guarding.  Psychiatric: She has a normal mood and affect. Her behavior is normal. Thought content normal.          Assessment & Plan:  Increase Vicodin to 7.5/500 tid. Switch to Zofran for nausea. Go back on xanax. Recheck in one month

## 2011-02-01 ENCOUNTER — Other Ambulatory Visit: Payer: Self-pay | Admitting: Otolaryngology

## 2011-02-01 DIAGNOSIS — D449 Neoplasm of uncertain behavior of unspecified endocrine gland: Secondary | ICD-10-CM

## 2011-02-04 ENCOUNTER — Ambulatory Visit
Admission: RE | Admit: 2011-02-04 | Discharge: 2011-02-04 | Disposition: A | Discharge status: Home / Self Care | Payer: Medicare Other | Source: Ambulatory Visit | Attending: Otolaryngology | Admitting: Otolaryngology

## 2011-02-04 DIAGNOSIS — D449 Neoplasm of uncertain behavior of unspecified endocrine gland: Secondary | ICD-10-CM

## 2011-02-05 ENCOUNTER — Other Ambulatory Visit: Payer: Self-pay | Admitting: Family Medicine

## 2011-02-05 ENCOUNTER — Telehealth: Payer: Self-pay | Admitting: Family Medicine

## 2011-02-05 NOTE — Telephone Encounter (Signed)
Refill request for Fluoxetine HCL 40 mg take 2 capsules qd. Pt last here on 01/16/11 and script last filled on 01/07/11.

## 2011-02-06 NOTE — Telephone Encounter (Signed)
Call in one year supply  

## 2011-02-07 ENCOUNTER — Other Ambulatory Visit: Payer: Self-pay | Admitting: Otolaryngology

## 2011-02-07 ENCOUNTER — Other Ambulatory Visit (HOSPITAL_COMMUNITY)
Admission: RE | Admit: 2011-02-07 | Discharge: 2011-02-07 | Disposition: A | Discharge status: Home / Self Care | Payer: Medicare Other | Source: Ambulatory Visit | Attending: Otolaryngology | Admitting: Otolaryngology

## 2011-02-07 DIAGNOSIS — E049 Nontoxic goiter, unspecified: Secondary | ICD-10-CM | POA: Insufficient documentation

## 2011-03-01 ENCOUNTER — Ambulatory Visit: Payer: Medicare Other | Admitting: Family Medicine

## 2011-03-04 ENCOUNTER — Encounter: Payer: Self-pay | Admitting: Family Medicine

## 2011-03-04 ENCOUNTER — Ambulatory Visit (INDEPENDENT_AMBULATORY_CARE_PROVIDER_SITE_OTHER): Payer: Medicare Other | Admitting: Family Medicine

## 2011-03-04 VITALS — BP 110/70 | HR 73 | Temp 99.4°F | Wt 121.0 lb

## 2011-03-04 DIAGNOSIS — F341 Dysthymic disorder: Secondary | ICD-10-CM

## 2011-03-04 DIAGNOSIS — F419 Anxiety disorder, unspecified: Secondary | ICD-10-CM
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
## 2011-03-21 LAB — COMPREHENSIVE METABOLIC PANEL
ALT: 9
Albumin: 2.3 — ABNORMAL LOW
Alkaline Phosphatase: 113
BUN: 4 — ABNORMAL LOW
BUN: 6
CO2: 23
Chloride: 108
Creatinine, Ser: 0.53
GFR calc Af Amer: >60
GFR calc non Af Amer: >60
Potassium: 4
Sodium: 131 — ABNORMAL LOW
Total Bilirubin: 0.4
Total Protein: 5.6 — ABNORMAL LOW

## 2011-03-21 LAB — DIFFERENTIAL
Basophils Absolute: 0
Basophils Relative: 0
Basophils Relative: 1
Eosinophils Absolute: 0
Eosinophils Relative: 1
Monocytes Absolute: 0.7
Monocytes Relative: 7
Neutro Abs: 7.8 — ABNORMAL HIGH

## 2011-03-21 LAB — CBC
HCT: 34.8 — ABNORMAL LOW
MCV: 83.6
Platelets: 424 — ABNORMAL HIGH
Platelets: 685 — ABNORMAL HIGH
RBC: 4.17
RDW: 18.8 — ABNORMAL HIGH
WBC: 8.6

## 2011-03-21 LAB — URINALYSIS, ROUTINE W REFLEX MICROSCOPIC
Bilirubin Urine: NEGATIVE
Glucose, UA: NEGATIVE
Hgb urine dipstick: NEGATIVE
Ketones, ur: NEGATIVE
Nitrite: NEGATIVE
Protein, ur: NEGATIVE
pH: 6.5

## 2011-03-21 LAB — LIPASE, BLOOD: Lipase: 12

## 2011-03-22 LAB — FERRITIN: Ferritin: 22 (ref 10–291)

## 2011-03-22 LAB — POCT I-STAT, CHEM 8
BUN: 5 — ABNORMAL LOW
Creatinine, Ser: 0.7
Glucose, Bld: 82
Hemoglobin: 12.6
Potassium: 3.3 — ABNORMAL LOW
TCO2: 26

## 2011-03-22 LAB — IRON AND TIBC
Iron: 18 — ABNORMAL LOW
Saturation Ratios: 9 — ABNORMAL LOW
TIBC: 200 — ABNORMAL LOW
UIBC: 182

## 2011-03-22 LAB — VITAMIN B12: Vitamin B-12: 572 (ref 211–911)

## 2011-03-22 LAB — DIFFERENTIAL
Eosinophils Absolute: 0.3
Eosinophils Relative: 3
Lymphocytes Relative: 22
Lymphs Abs: 2.4
Monocytes Relative: 6

## 2011-03-22 LAB — BASIC METABOLIC PANEL
BUN: 3 — ABNORMAL LOW
Chloride: 110
GFR calc Af Amer: >60
Potassium: 4.1
Sodium: 141

## 2011-03-22 LAB — CBC
HCT: 36.6
MCV: 82.9
RBC: 4.41
WBC: 11.3 — ABNORMAL HIGH

## 2011-03-27 LAB — COMPREHENSIVE METABOLIC PANEL
ALT: 107 — ABNORMAL HIGH
AST: 28
Albumin: 1.4 — ABNORMAL LOW
Alkaline Phosphatase: 342 — ABNORMAL HIGH
BUN: 9
CO2: 27
CO2: 30
Creatinine, Ser: 0.42
GFR calc non Af Amer: >60
Glucose, Bld: 112 — ABNORMAL HIGH
Glucose, Bld: 114 — ABNORMAL HIGH
Potassium: 3.6
Potassium: 3.7
Sodium: 138
Sodium: 138
Total Bilirubin: 0.3
Total Protein: 4.3 — ABNORMAL LOW
Total Protein: 4.4 — ABNORMAL LOW

## 2011-03-27 LAB — GLUCOSE, CAPILLARY
Glucose-Capillary: 103 — ABNORMAL HIGH
Glucose-Capillary: 104 — ABNORMAL HIGH
Glucose-Capillary: 121 — ABNORMAL HIGH
Glucose-Capillary: 125 — ABNORMAL HIGH
Glucose-Capillary: 128 — ABNORMAL HIGH
Glucose-Capillary: 132 — ABNORMAL HIGH
Glucose-Capillary: 97

## 2011-03-27 LAB — CBC
HCT: 26.4 — ABNORMAL LOW
HCT: 26.5 — ABNORMAL LOW
Hemoglobin: 8.8 — ABNORMAL LOW
Hemoglobin: 8.8 — ABNORMAL LOW
MCHC: 33.3
MCV: 86.9
RBC: 3.02 — ABNORMAL LOW
RBC: 3.04 — ABNORMAL LOW
RDW: 17.8 — ABNORMAL HIGH
RDW: 18.5 — ABNORMAL HIGH

## 2011-03-27 LAB — PHOSPHORUS: Phosphorus: 4.5

## 2011-03-28 ENCOUNTER — Other Ambulatory Visit: Payer: Self-pay | Admitting: Family Medicine

## 2011-03-28 DIAGNOSIS — M25551 Pain in right hip: Secondary | ICD-10-CM

## 2011-03-29 ENCOUNTER — Other Ambulatory Visit: Payer: Medicare Other

## 2011-04-01 ENCOUNTER — Ambulatory Visit
Admission: RE | Admit: 2011-04-01 | Discharge: 2011-04-01 | Disposition: A | Discharge status: Home / Self Care | Payer: Medicare Other | Source: Ambulatory Visit | Attending: Family Medicine | Admitting: Family Medicine

## 2011-04-01 DIAGNOSIS — M25551 Pain in right hip: Secondary | ICD-10-CM

## 2011-04-01 LAB — CBC
Hemoglobin: 13.2
MCHC: 34.1
MCV: 89.6
RBC: 4.32
RDW: 15.3

## 2011-04-01 LAB — COMPREHENSIVE METABOLIC PANEL
BUN: 10
Calcium: 9
Glucose, Bld: 119 — ABNORMAL HIGH
Total Protein: 6.3

## 2011-04-01 LAB — URINALYSIS, ROUTINE W REFLEX MICROSCOPIC
Bilirubin Urine: NEGATIVE
Hgb urine dipstick: NEGATIVE
Ketones, ur: NEGATIVE
Nitrite: NEGATIVE
Protein, ur: NEGATIVE
Urobilinogen, UA: 0.2

## 2011-04-01 LAB — DIFFERENTIAL
Basophils Relative: 1
Eosinophils Absolute: 0.1 — ABNORMAL LOW
Monocytes Absolute: 0.8
Monocytes Relative: 6
Neutrophils Relative %: 75

## 2011-04-01 LAB — URINE MICROSCOPIC-ADD ON

## 2011-04-01 LAB — TSH: TSH: 1.46

## 2011-04-01 LAB — RAPID URINE DRUG SCREEN, HOSP PERFORMED
Barbiturates: NOT DETECTED
Benzodiazepines: NOT DETECTED
Cocaine: NOT DETECTED
Opiates: POSITIVE — AB

## 2011-04-01 LAB — BASIC METABOLIC PANEL
CO2: 17 — ABNORMAL LOW
Chloride: 101
Creatinine, Ser: 0.99
GFR calc Af Amer: >60
Glucose, Bld: 111 — ABNORMAL HIGH

## 2011-04-02 ENCOUNTER — Inpatient Hospital Stay (HOSPITAL_COMMUNITY)
Admission: RE | Admit: 2011-04-02 | Discharge: 2011-04-05 | DRG: 482 | Disposition: A | Discharge status: Home Health | Payer: Medicare Other | Source: Ambulatory Visit | Attending: Orthopedic Surgery | Admitting: Orthopedic Surgery

## 2011-04-02 ENCOUNTER — Inpatient Hospital Stay (HOSPITAL_COMMUNITY): Payer: Medicare Other

## 2011-04-02 DIAGNOSIS — K219 Gastro-esophageal reflux disease without esophagitis: Secondary | ICD-10-CM | POA: Diagnosis present

## 2011-04-02 DIAGNOSIS — J4489 Other specified chronic obstructive pulmonary disease: Secondary | ICD-10-CM | POA: Diagnosis present

## 2011-04-02 DIAGNOSIS — J449 Chronic obstructive pulmonary disease, unspecified: Secondary | ICD-10-CM | POA: Diagnosis present

## 2011-04-02 DIAGNOSIS — S72009A Fracture of unspecified part of neck of unspecified femur, initial encounter for closed fracture: Principal | ICD-10-CM | POA: Diagnosis present

## 2011-04-02 LAB — COMPREHENSIVE METABOLIC PANEL
ALT: 10 U/L (ref 0–35)
AST: 12 U/L (ref 0–37)
CO2: 27 mEq/L (ref 19–32)
Calcium: 9.1 mg/dL (ref 8.4–10.5)
Chloride: 102 mEq/L (ref 96–112)
GFR calc non Af Amer: >90 mL/min (ref >90)
Sodium: 138 mEq/L (ref 135–145)
Total Bilirubin: 0.3 mg/dL (ref 0.3–1.2)

## 2011-04-02 LAB — CBC
HCT: 39 % (ref 36.0–46.0)
Hemoglobin: 13.1 g/dL (ref 12.0–15.0)
RBC: 4.36 MIL/uL (ref 3.87–5.11)
WBC: 12 10*3/uL — ABNORMAL HIGH (ref 4.0–10.5)

## 2011-04-02 LAB — TYPE AND SCREEN

## 2011-04-02 LAB — PROTIME-INR: INR: 0.89 (ref 0.00–1.49)

## 2011-04-02 LAB — SURGICAL PCR SCREEN: Staphylococcus aureus: NEGATIVE

## 2011-04-03 LAB — CBC
HCT: 34.8 % — ABNORMAL LOW (ref 36.0–46.0)
Hemoglobin: 11.3 g/dL — ABNORMAL LOW (ref 12.0–15.0)
MCV: 89.7 fL (ref 78.0–100.0)
RBC: 3.88 MIL/uL (ref 3.87–5.11)
WBC: 11.6 10*3/uL — ABNORMAL HIGH (ref 4.0–10.5)

## 2011-04-03 LAB — BASIC METABOLIC PANEL
CO2: 24 mEq/L (ref 19–32)
Glucose, Bld: 101 mg/dL — ABNORMAL HIGH (ref 70–99)
Potassium: 3.6 mEq/L (ref 3.5–5.1)
Sodium: 137 mEq/L (ref 135–145)

## 2011-04-03 LAB — PROTIME-INR: INR: 0.87 (ref 0.00–1.49)

## 2011-04-04 LAB — URINALYSIS, ROUTINE W REFLEX MICROSCOPIC
Bilirubin Urine: NEGATIVE
Ketones, ur: NEGATIVE
Nitrite: NEGATIVE
Specific Gravity, Urine: 1.018
Urobilinogen, UA: 0.2
pH: 5.5

## 2011-04-04 LAB — DIFFERENTIAL
Basophils Absolute: 0.1
Eosinophils Relative: 2
Lymphocytes Relative: 31
Lymphs Abs: 2.7
Monocytes Absolute: 0.6
Monocytes Relative: 7
Neutro Abs: 5.2

## 2011-04-04 LAB — COMPREHENSIVE METABOLIC PANEL
AST: 16
Albumin: 2.8 — ABNORMAL LOW
BUN: 5 — ABNORMAL LOW
Calcium: 8.6
Chloride: 97
Creatinine, Ser: 0.63
GFR calc Af Amer: >60
Total Protein: 6

## 2011-04-04 LAB — CBC
HCT: 38.6
MCV: 89.3
Platelets: 405 — ABNORMAL HIGH
RDW: 14.9 — ABNORMAL HIGH
WBC: 8.7

## 2011-04-04 LAB — PROTIME-INR
INR: 1.1 (ref 0.00–1.49)
Prothrombin Time: 14.4 seconds (ref 11.6–15.2)

## 2011-04-05 LAB — PROTIME-INR
INR: 1.56 — ABNORMAL HIGH (ref 0.00–1.49)
Prothrombin Time: 19 s — ABNORMAL HIGH (ref 11.6–15.2)
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
## 2011-04-05 NOTE — Discharge Summary (Signed)
  NAMESHENANDOAH, YEATS NO.:  [id number]  MEDICAL RECORD NO.:  [id number]  LOCATION:  5021                         FACILITY:  [facility]  PHYSICIAN:  Eulas Post, MD    DATE OF BIRTH:  [birth year]  DATE OF ADMISSION:  04/02/2011 DATE OF DISCHARGE:  04/05/2011                              DISCHARGE SUMMARY   ADMISSION DIAGNOSIS:  Right femoral neck fracture.  DISCHARGE DIAGNOSIS:  Right femoral neck fracture.  ADDITIONAL DIAGNOSES: 1. History of left femoral neck fracture. 2. History of narcotic dependence 3. History of a gastrointestinal ulcer.  HOSPITAL COURSE:  [name] is a 60 year old woman who had a right hip fracture.  She underwent percutaneous pinning.  She tolerated the procedure well and postoperatively did not have any complications. She was given perioperative antibiotics for antimicrobial prophylaxis. She was given sequential compression devices and Lovenox bridging to Coumadin for DVT prophylaxis, plan for a period of 1 month.  Her hemoglobin and hematocrit remained stable.  She is planned to be discharged home with followup with me in 2 weeks.  She passed physical therapy.  She is 50% weightbearing on the right lower extremity.  She benefited maximally from her hospital stay.  I will plan to see her back again in 2 weeks.  There were no complications.     Eulas Post, MD     JPL/MEDQ  D:  04/05/2011  T:  04/05/2011  Job:  [id number]  Electronically Signed by Teryl Lucy MD on 04/05/2011 11:06:13 AM

## 2011-04-09 LAB — BASIC METABOLIC PANEL
BUN: 4 — ABNORMAL LOW
Chloride: 103
GFR calc Af Amer: >60
GFR calc non Af Amer: >60
Potassium: 4.6
Sodium: 136

## 2011-04-09 LAB — CBC
HCT: 35.2 — ABNORMAL LOW
MCV: 90.5
Platelets: 271
RBC: 3.89
WBC: 9.9

## 2011-05-23 ENCOUNTER — Telehealth: Payer: Self-pay | Admitting: *Deleted

## 2011-05-23 NOTE — Telephone Encounter (Signed)
Patient is calling because she is having sinus issues.  She would like to try OTC but she has a gastric ulcer.  She is not sure what would be safe for her to use.  Any suggestions?

## 2011-05-23 NOTE — Telephone Encounter (Signed)
Tried to call but the line was busy 

## 2011-05-23 NOTE — Telephone Encounter (Signed)
She may safely use Mucinex (plain)

## 2011-05-27 NOTE — Telephone Encounter (Signed)
Spoke with pt

## 2011-06-25 DIAGNOSIS — E041 Nontoxic single thyroid nodule: Secondary | ICD-10-CM

## 2011-06-25 HISTORY — DX: Nontoxic single thyroid nodule: E04.1

## 2011-07-18 ENCOUNTER — Other Ambulatory Visit: Payer: Self-pay | Admitting: Otolaryngology

## 2011-07-18 DIAGNOSIS — D449 Neoplasm of uncertain behavior of unspecified endocrine gland: Secondary | ICD-10-CM

## 2011-07-22 ENCOUNTER — Ambulatory Visit
Admission: RE | Admit: 2011-07-22 | Discharge: 2011-07-22 | Disposition: A | Discharge status: Home / Self Care | Payer: Medicare Other | Source: Ambulatory Visit | Attending: Otolaryngology | Admitting: Otolaryngology

## 2011-07-22 DIAGNOSIS — D449 Neoplasm of uncertain behavior of unspecified endocrine gland: Secondary | ICD-10-CM

## 2011-07-24 ENCOUNTER — Other Ambulatory Visit: Payer: Self-pay | Admitting: Otolaryngology

## 2011-07-24 DIAGNOSIS — E041 Nontoxic single thyroid nodule: Secondary | ICD-10-CM

## 2011-07-25 ENCOUNTER — Ambulatory Visit (INDEPENDENT_AMBULATORY_CARE_PROVIDER_SITE_OTHER): Payer: Medicare Other | Admitting: Family Medicine

## 2011-07-25 ENCOUNTER — Encounter: Payer: Self-pay | Admitting: Family Medicine

## 2011-07-25 VITALS — BP 108/70 | HR 84 | Temp 98.4°F | Wt 132.0 lb

## 2011-07-25 DIAGNOSIS — F419 Anxiety disorder, unspecified: Secondary | ICD-10-CM

## 2011-07-25 DIAGNOSIS — K219 Gastro-esophageal reflux disease without esophagitis: Secondary | ICD-10-CM

## 2011-07-25 DIAGNOSIS — R1013 Epigastric pain: Secondary | ICD-10-CM

## 2011-07-25 DIAGNOSIS — F411 Generalized anxiety disorder: Secondary | ICD-10-CM

## 2011-07-25 MED ORDER — LAMOTRIGINE 100 MG PO TABS: 100.0000 mg | ORAL_TABLET | Freq: Every day | ORAL | Status: DC

## 2011-07-25 MED ORDER — GABAPENTIN 300 MG PO CAPS: 300.0000 mg | ORAL_CAPSULE | Freq: Two times a day (BID) | ORAL | Status: DC

## 2011-07-25 MED ORDER — ONDANSETRON HCL 8 MG PO TABS: 8.0000 mg | ORAL_TABLET | Freq: Three times a day (TID) | ORAL | Status: DC | PRN

## 2011-07-25 MED ORDER — OMEPRAZOLE 40 MG PO CPDR: 40.0000 mg | DELAYED_RELEASE_CAPSULE | Freq: Two times a day (BID) | ORAL | Status: DC

## 2011-07-25 MED ORDER — CLONIDINE HCL 0.1 MG PO TABS: 0.1000 mg | ORAL_TABLET | Freq: Two times a day (BID) | ORAL | Status: DC
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
## 2011-07-28 ENCOUNTER — Encounter (HOSPITAL_COMMUNITY): Payer: Self-pay | Admitting: *Deleted

## 2011-07-28 ENCOUNTER — Emergency Department (HOSPITAL_COMMUNITY): Payer: Medicare Other

## 2011-07-28 ENCOUNTER — Emergency Department (HOSPITAL_COMMUNITY)
Admission: EM | Admit: 2011-07-28 | Discharge: 2011-07-28 | Disposition: A | Discharge status: Home / Self Care | Payer: Medicare Other | Attending: Emergency Medicine | Admitting: Emergency Medicine

## 2011-07-28 DIAGNOSIS — S82409A Unspecified fracture of shaft of unspecified fibula, initial encounter for closed fracture: Secondary | ICD-10-CM

## 2011-07-28 DIAGNOSIS — W010XXA Fall on same level from slipping, tripping and stumbling without subsequent striking against object, initial encounter: Secondary | ICD-10-CM | POA: Insufficient documentation

## 2011-07-28 DIAGNOSIS — M25579 Pain in unspecified ankle and joints of unspecified foot: Secondary | ICD-10-CM | POA: Insufficient documentation

## 2011-07-28 DIAGNOSIS — F17200 Nicotine dependence, unspecified, uncomplicated: Secondary | ICD-10-CM | POA: Insufficient documentation

## 2011-07-28 DIAGNOSIS — Y93K1 Activity, walking an animal: Secondary | ICD-10-CM | POA: Insufficient documentation

## 2011-07-28 DIAGNOSIS — R609 Edema, unspecified: Secondary | ICD-10-CM | POA: Insufficient documentation

## 2011-07-28 HISTORY — DX: Unspecified fracture of shaft of unspecified fibula, initial encounter for closed fracture: S82.409A

## 2011-07-28 MED ORDER — OXYCODONE-ACETAMINOPHEN 5-325 MG PO TABS
1.0000 | ORAL_TABLET | Freq: Once | ORAL | Status: AC
  Administered 2011-07-28: 1 via ORAL
  Filled 2011-07-28: qty 1

## 2011-07-28 MED ORDER — OXYCODONE-ACETAMINOPHEN 5-325 MG PO TABS: 1.0000 | ORAL_TABLET | Freq: Three times a day (TID) | ORAL | Status: AC | PRN

## 2011-07-28 NOTE — ED Notes (Signed)
MD at bedside. 

## 2011-07-28 NOTE — ED Provider Notes (Addendum)
History     CSN: [id number]  Arrival date & time 07/28/11  1115   First MD Initiated Contact with Patient 07/28/11 1125      Chief Complaint  Patient presents with  . Fall  . Ankle Pain    Bilateral     HPI The patient presents with bilateral ankle pain.  She notes that yesterday she was walking her dog fell awkwardly across a railroad tie.  Since that time she has had persistent sharp pain in both ankles and distal calves.  The pain is worse with weightbearing, minimally improved with narcotics.  She denies falling onto any other extremities or any head contact or loss of consciousness.  She denies other complaints. Past Medical History  Diagnosis Date  . Peptic ulcer disease     dr Randa Evens  . Gastric outlet obstruction   . Cancer   . Acute duodenal ulcer with hemorrhage and perforation, with obstruction   . Barrett's esophagus   . Anxiety   . Menopause   . GERD (gastroesophageal reflux disease)   . Asthma   . Chronic abdominal pain     narcotic dependence, dr gyarteng-dak [facility]  . Narcotic abuse     Past Surgical History  Procedure Date  . Esophagogastroduodenoscopy 12-29-09    dr qadeer at [facility], several gastric ulcers  . Vagotomy     lapaproscopic - [facility]  . 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 Cancer Mother     kidney, magliant tumor on face  . Celiac disease Father     History  Substance Use Topics  . Smoking status: Current Everyday Smoker -- 2.0 packs/day for 44 years    Types: Cigarettes    Last Attempt to Quit: 10/23/2010  . Smokeless tobacco: Never Used   Comment: Using Electric Cigarette  . Alcohol Use: No    OB History    Grav Para Term Preterm Abortions TAB SAB Ect Mult Living                  Review of Systems  All other systems reviewed and are negative.    Allergies  Alprazolam; Morphine; Quetiapine; and Varenicline tartrate  Home Medications    Current Outpatient Rx  Name Route Sig Dispense Refill  . ALBUTEROL SULFATE (2.5 MG/3ML) 0.083% IN NEBU Nebulization Take 1 vial by nebulization 4 (four) times daily as needed.     Marland Kitchen CLONIDINE HCL 0.1 MG PO TABS Oral Take 1 tablet (0.1 mg total) by mouth 2 (two) times daily. 60 tablet 0  . GABAPENTIN 300 MG PO CAPS Oral Take 1 capsule (300 mg total) by mouth 2 (two) times daily. 60 capsule 0  . IPRATROPIUM BROMIDE 0.02 % IN SOLN Nebulization Take 1 vial by nebulization 4 (four) times daily as needed.     Marland Kitchen LAMOTRIGINE 100 MG PO TABS Oral Take 1 tablet (100 mg total) by mouth daily. 30 tablet 0  . OMEPRAZOLE 40 MG PO CPDR Oral Take 1 capsule (40 mg total) by mouth 2 (two) times daily. 60 capsule 11  . ONDANSETRON HCL 8 MG PO TABS Oral Take 1 tablet (8 mg total) by mouth every 8 (eight) hours as needed for nausea. 90 tablet 5  . PROZAC 40 MG PO CAPS  TAKE (2) CAPSULES DAILY. 60 each 11    BP 115/64  Pulse 76  Temp(Src) 99.2 F (37.3 C) (Oral)  Resp 18  Wt 136 lb (61.689 kg)  SpO2 96%  Physical Exam  Nursing note and vitals reviewed. Constitutional: She is oriented to person, place, and time. She appears well-developed and well-nourished. No distress.  HENT:  Head: Normocephalic and atraumatic.  Eyes: Conjunctivae and EOM are normal.  Cardiovascular: Normal rate and regular rhythm.   Pulmonary/Chest: Effort normal and breath sounds normal. No stridor. No respiratory distress.  Abdominal: She exhibits no distension.  Musculoskeletal:       Legs:      Feet:       No knee or hip pain. Plantarflexion, dorsiflexion of the feet are appropriate. DP/PT pulses intact, appropriate cap refill  Neurological: She is alert and oriented to person, place, and time. No cranial nerve deficit.  Skin: Skin is warm and dry.  Psychiatric: She has a normal mood and affect.    ED Course  Procedures (including critical care time)  Labs Reviewed - No data to display No results found.   No  diagnosis found.   X-rays reviewed by me MDM  This generally well female presents with bilateral extremity pain following a fall.  On exam she is tender to palpation in multiple areas.  The patient's x-rays demonstrate 2 left fibula fractures.  Given the patient's baseline borderline gait instability, long-leg immobilization is impractical.  She was discharged with the Cam Walker and will follow up with her orthopedist this week   Prior to D/C the patient requests a splint for her right ankle.  She was counseled about the decreased gait stability that this would allow.  She acknowledges this, and re-iterates her desire for the brace, which was provided.     Gerhard Munch, MD 07/28/11 1252  Gerhard Munch, MD 07/28/11 [id number]

## 2011-07-28 NOTE — ED Notes (Signed)
Patient transported to X-ray 

## 2011-07-28 NOTE — ED Notes (Signed)
Pt from home via EMS c/o bilateral ankle pain after injury while walking dog and the leash became entangled with her legs causing pt to stumble causing a twisting motion to ankles. Per EMS, right ankle with swelling and bilateral ankles with bruising.

## 2011-07-31 ENCOUNTER — Inpatient Hospital Stay: Admission: RE | Admit: 2011-07-31 | Payer: Medicare Other | Source: Ambulatory Visit

## 2011-07-31 ENCOUNTER — Inpatient Hospital Stay
Admission: RE | Admit: 2011-07-31 | Discharge: 2011-07-31 | Payer: Medicare Other | Source: Ambulatory Visit | Attending: Otolaryngology | Admitting: Otolaryngology

## 2011-08-13 ENCOUNTER — Other Ambulatory Visit (HOSPITAL_COMMUNITY)
Admission: RE | Admit: 2011-08-13 | Discharge: 2011-08-13 | Disposition: A | Discharge status: Home / Self Care | Payer: Medicare Other | Source: Ambulatory Visit | Attending: Interventional Radiology | Admitting: Interventional Radiology

## 2011-08-13 ENCOUNTER — Ambulatory Visit
Admission: RE | Admit: 2011-08-13 | Discharge: 2011-08-13 | Disposition: A | Discharge status: Home / Self Care | Payer: Medicare Other | Source: Ambulatory Visit | Attending: Otolaryngology | Admitting: Otolaryngology

## 2011-08-13 DIAGNOSIS — E049 Nontoxic goiter, unspecified: Secondary | ICD-10-CM | POA: Insufficient documentation

## 2011-08-13 DIAGNOSIS — E041 Nontoxic single thyroid nodule: Secondary | ICD-10-CM

## 2011-08-13 HISTORY — PX: BIOPSY THYROID: PRO38

## 2011-08-21 ENCOUNTER — Ambulatory Visit: Payer: Medicare Other

## 2011-08-21 ENCOUNTER — Ambulatory Visit: Payer: Medicare Other | Admitting: Family Medicine

## 2011-08-21 ENCOUNTER — Ambulatory Visit (INDEPENDENT_AMBULATORY_CARE_PROVIDER_SITE_OTHER): Payer: Medicare Other | Admitting: Family Medicine

## 2011-08-21 VITALS — BP 140/86 | HR 64 | Temp 98.8°F | Resp 16 | Ht 63.5 in | Wt 127.0 lb

## 2011-08-21 DIAGNOSIS — J159 Unspecified bacterial pneumonia: Secondary | ICD-10-CM

## 2011-08-21 DIAGNOSIS — R05 Cough: Secondary | ICD-10-CM

## 2011-08-21 DIAGNOSIS — Z720 Tobacco use: Secondary | ICD-10-CM

## 2011-08-21 DIAGNOSIS — F17200 Nicotine dependence, unspecified, uncomplicated: Secondary | ICD-10-CM

## 2011-08-21 MED ORDER — DOXYCYCLINE HYCLATE 100 MG PO CAPS: 100.0000 mg | ORAL_CAPSULE | Freq: Two times a day (BID) | ORAL | Status: AC

## 2011-08-21 NOTE — Patient Instructions (Signed)
Prednisone 10mg   Take 4 (40mg ) daily for 2 days,  Then take 2 (20mg ) daily for 3 days

## 2011-08-21 NOTE — Progress Notes (Signed)
Patient Name: [name] Date of Birth: September 05, 1950 Medical Record Number: [id number] Gender: female Date of Encounter: 08/21/2011  History of Present Illness:  [name] is a 61 y.o. very pleasant female patient who presents with the following:  History of COPD.  History of severe pneumonia after an operation a year ago- had "fluid drawn off her lungs."  About 5 days ago her roommate brought home an illness- she thinks that she may have caught it. Developed a cough.  Concerned that she will get fluid on her lungs again.  Is using albuteral and atrovent nebs frequently.  Cough is a little better but she has cough attacks.  Does have persistent wheezing.  Cough is non- productive.  Sometimes has a temp to about 100 degrees especially at night.  No ST, no earache.  No GI symptoms  Has restarted smoking   Patient Active Problem List  Diagnoses  . FEVER, RECURRENT  . DEPRESSIVE TYPE PSYCHOSIS  . ANXIETY  . CIGARETTE SMOKER  . TARDIVE DYSKINESIA  . LUNG NODULE  . GERD  . ACUTE DUODEN ULCER W/HEMORR PERF&OBSTRUCTION  . PEPTIC ULCER DISEASE  . GASTROPARESIS  . GASTRIC OUTLET OBSTRUCTION  . MENOPAUSE  . LEG EDEMA  . WEIGHT LOSS  . SYMPTOM, PAIN, ABDOMINAL, GENERALIZED  . BARRETT'S ESOPHAGUS, HX OF  . DEPRESSION  . COPD (chronic obstructive pulmonary disease)  . Pneumonia, organism unspecified   Past Medical History  Diagnosis Date  . Peptic ulcer disease     dr Randa Evens  . Gastric outlet obstruction   . Cancer   . Acute duodenal ulcer with hemorrhage and perforation, with obstruction   . Barrett's esophagus   . Anxiety   . Menopause   . GERD (gastroesophageal reflux disease)   . Asthma   . Chronic abdominal pain     narcotic dependence, dr gyarteng-dak at [facility]  . Narcotic abuse    Past Surgical History  Procedure Date  . Esophagogastroduodenoscopy 12-29-09    dr qadeer at [facility], several gastric ulcers  . Vagotomy     lapaproscopic - [facility]  .  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 Smoking status: Current Everyday Smoker -- 2.0 packs/day for 44 years    Types: Cigarettes    Last Attempt to Quit: 10/23/2010  . Smokeless tobacco: Never Used   Comment: Using Electric Cigarette  . Alcohol Use: No   Family History  Problem Relation Age of Onset  . Cancer Mother     kidney, magliant tumor on face  . Celiac disease Father    Allergies  Allergen Reactions  . Alprazolam     REACTION: unspecified  . Morphine     REACTION: itching  . Quetiapine     REACTION: tardive dyskinesia  . Varenicline Tartrate     REACTION: unspecified    Medication list has been reviewed and updated.  Review of Systems: As per HPI- otherwise negative.  Physical Examination: Filed Vitals:   08/21/11 0903  BP: 140/86  Pulse: 64  Temp: 98.8 F (37.1 C)  TempSrc: Oral  Resp: 16  Height: 5' 3.5" (1.613 m)  Weight: 127 lb (57.607 kg)    Body mass index is 22.14 kg/(m^2).  GEN: WDWN, NAD, Non-toxic, A & O x 3, thin, smells of tobacco HEENT: Atraumatic, Normocephalic. Neck supple. No masses, No LAD. Ears and Nose: No external deformity. CV: RRR, No M/G/R. No JVD. No thrill.  No extra heart sounds. PULM: expiratory wheezes bilaterally. No retractions. No resp. distress. No accessory muscle use. EXTR: No c/c/e NEURO Normal gait.  PSYCH: Normally interactive. Conversant. Not depressed or anxious appearing.  Calm demeanor.   [facility] reading (PRIMARY) by  Dr. Patsy Lager.  CXR negative for acute disease   Assessment and Plan: COPD exacerbation vs bronchitis.   Doxycycline 100 BID for 10 days. She has a supply of prednisone 10mg  available- will have her take 40mg  for 2 days, then 20 mg for 3 days Let us know if not better in a few days- sooner if worse!  Strongly encouraged her to stop smoking as it will worsen her COPD

## 2011-08-24 ENCOUNTER — Ambulatory Visit: Payer: Medicare Other

## 2011-08-24 ENCOUNTER — Ambulatory Visit (INDEPENDENT_AMBULATORY_CARE_PROVIDER_SITE_OTHER): Payer: Medicare Other | Admitting: Internal Medicine

## 2011-08-24 VITALS — BP 125/81 | HR 73 | Temp 99.1°F | Resp 16 | Ht 63.5 in | Wt 130.0 lb

## 2011-08-24 DIAGNOSIS — R079 Chest pain, unspecified: Secondary | ICD-10-CM

## 2011-08-24 DIAGNOSIS — R0781 Pleurodynia: Secondary | ICD-10-CM

## 2011-08-24 DIAGNOSIS — S2249XB Multiple fractures of ribs, unspecified side, initial encounter for open fracture: Secondary | ICD-10-CM

## 2011-08-24 DIAGNOSIS — S20219A Contusion of unspecified front wall of thorax, initial encounter: Secondary | ICD-10-CM

## 2011-08-24 DIAGNOSIS — J159 Unspecified bacterial pneumonia: Secondary | ICD-10-CM

## 2011-08-24 HISTORY — DX: Multiple fractures of ribs, unspecified side, initial encounter for open fracture: S22.49XB

## 2011-08-24 MED ORDER — HYDROCODONE-ACETAMINOPHEN 5-500 MG PO TABS: ORAL_TABLET | ORAL | Status: DC

## 2011-08-24 MED ORDER — KETOROLAC TROMETHAMINE 60 MG/2ML IM SOLN
60.0000 mg | Freq: Once | INTRAMUSCULAR | Status: AC
  Administered 2011-08-24: 60 mg via INTRAMUSCULAR

## 2011-08-24 NOTE — Progress Notes (Addendum)
  Subjective:    Patient ID: Linward Foster, female    DOB: [birth year], 61 y.o.   MRN: [id number]  HPI61 yo CF c/o L left rib/chest pain S/P falling last night when she was reaching for something and weight of cam-walker boot made her feel off balance.  +recent URI.  No SOB.  Requests "pain meds and let me go home!"  Patient can't take morphine or oxycodone but does fine with hydrocodone. +heavy smoker Review of Systems  All other systems reviewed and are negative.       Objective:   Physical Exam  Constitutional: She appears well-developed.  HENT:  Head: Normocephalic and atraumatic.  Neck: Normal range of motion. Neck supple.  Cardiovascular: Normal rate, regular rhythm and normal heart sounds.   Pulmonary/Chest: Effort normal and breath sounds normal. She exhibits tenderness (no ecchymosis) and bony tenderness (no point tenderness). She exhibits no laceration and no swelling.        [facility] reading (PRIMARY) by  Dr. Merla Riches no obvious fx/lungs clear       Assessment & Plan:  Rib contusions-Large ace wrap X2 to mimic rib belt for comfort. Pain rx.

## 2011-08-26 ENCOUNTER — Telehealth: Payer: Self-pay

## 2011-08-26 NOTE — Telephone Encounter (Signed)
Pt states that we were unable to read her X-ray when she came in on Saturday, pt would like to know have they been read by the radiologist yet because she expected a call back today.

## 2011-08-27 NOTE — Telephone Encounter (Signed)
Marylene Land, can you please review and comment on radiologist report?

## 2011-08-29 MED ORDER — HYDROCODONE-ACETAMINOPHEN 5-325 MG PO TABS: 1.0000 | ORAL_TABLET | ORAL | Status: DC | PRN

## 2011-08-29 NOTE — Telephone Encounter (Signed)
Called pt and explained radiologist report and RF. Pt states that her pain seems like it is even worse. Asked pt if she if wearing her ACE wrap and she stated she is wearing it about 1/2 the time. Advised pt that it will help support the ribs and will hurt less as she moves. Advised pt to RTC if pain persists/worsens. Pt agreed and stated that she knows it takes a long time to heal and stop hurting. Called in Rx for Norco to [facility] at pts request.

## 2011-08-29 NOTE — Telephone Encounter (Signed)
Pt calling again to say that she is now out of pain medication and no one has still called her about her xray

## 2011-08-29 NOTE — Telephone Encounter (Signed)
Will refill meds once and then pt needs an OV for recheck.  The radiologist sees rib fracture but they ? Whether they are new or not.  Meds are signed at TL desk.

## 2011-08-31 ENCOUNTER — Telehealth: Payer: Self-pay

## 2011-08-31 NOTE — Telephone Encounter (Signed)
Pt states she spoke with someone recently about xray results and refilling pain rx. States thank you. States she also had some muscle relaxer's at home from different problem and took them and they really helped. Would like to know if she could speak to someone about filling some muscle relaxer's for her  bf

## 2011-09-03 MED ORDER — CYCLOBENZAPRINE HCL 5 MG PO TABS: 5.0000 mg | ORAL_TABLET | Freq: Three times a day (TID) | ORAL | Status: AC | PRN

## 2011-09-03 NOTE — Telephone Encounter (Signed)
Hydrocodone was sent to pharmacy on 3/7 I will send Flexeril without any problems.

## 2011-09-03 NOTE — Telephone Encounter (Signed)
Notified pt that both Rxs were sent to [facility], one today and one on 3/7. Pt thanked [place]

## 2011-09-03 NOTE — Telephone Encounter (Signed)
Spoke with patient, she states that she is still having some pain over her ribs and back.  Used old rx of Flexeril and it gave her some relief, but she is out of both the Flexeril and pain med (?Vicodin).  Would like both of these refilled to [facility].  Can we rx?

## 2011-09-07 ENCOUNTER — Ambulatory Visit (INDEPENDENT_AMBULATORY_CARE_PROVIDER_SITE_OTHER): Payer: Medicare Other | Admitting: Internal Medicine

## 2011-09-07 ENCOUNTER — Ambulatory Visit: Payer: Medicare Other

## 2011-09-07 VITALS — BP 118/76 | HR 98 | Temp 99.0°F | Resp 18 | Ht 63.0 in | Wt 127.0 lb

## 2011-09-07 DIAGNOSIS — S2239XB Fracture of one rib, unspecified side, initial encounter for open fracture: Secondary | ICD-10-CM

## 2011-09-07 DIAGNOSIS — R071 Chest pain on breathing: Secondary | ICD-10-CM

## 2011-09-07 DIAGNOSIS — S2239XA Fracture of one rib, unspecified side, initial encounter for closed fracture: Secondary | ICD-10-CM

## 2011-09-07 MED ORDER — HYDROCODONE-ACETAMINOPHEN 5-500 MG PO TABS: ORAL_TABLET | ORAL | Status: DC

## 2011-09-07 NOTE — Progress Notes (Signed)
  Subjective:    Patient ID: [name], female    DOB: [birth year], 61 y.o.   MRN: [id number]  HPI Rib fx 2 wks ago. Still painfull and wants refill of meds. No new sob, hemoptysis, orthopnea   Review of Systems     Objective:   Physical Exam  Constitutional: She is oriented to person, place, and time. She appears well-developed and well-nourished.  Eyes: EOM are normal.  Neck: Neck supple.  Cardiovascular: Normal rate and regular rhythm.   Pulmonary/Chest: Effort normal and breath sounds normal. No respiratory distress. She exhibits tenderness.  Abdominal: There is no tenderness.  Neurological: She is alert and oriented to person, place, and time.  Skin: Skin is warm and dry. No rash noted.  Psychiatric: She has a normal mood and affect.   [facility] reading (PRIMARY) by  Dr.Mykaila Blunck CXR her normal        Assessment & Plan:  Refill vicodin

## 2011-09-07 NOTE — Patient Instructions (Signed)
Rib Fracture Your caregiver has diagnosed you as having a rib fracture (a break). This can occur by a blow to the chest, by a fall against a hard object, or by violent coughing or sneezing. There may be one or many breaks. Rib fractures may heal on their own within 3 to 8 weeks. The longer healing period is usually associated with a continued cough or other aggravating activities. HOME CARE INSTRUCTIONS   Avoid strenuous activity. Be careful during activities and avoid bumping the injured rib. Activities that cause pain pull on the fracture site(s) and are best avoided if possible.   Eat a normal, well-balanced diet. Drink plenty of fluids to avoid constipation.   Take deep breaths several times a day to keep lungs free of infection. Try to cough several times a day, splinting the injured area with a pillow. This will help prevent pneumonia.   Do not wear a rib belt or binder. These restrict breathing which can lead to pneumonia.   Only take over-the-counter or prescription medicines for pain, discomfort, or fever as directed by your caregiver.  SEEK MEDICAL CARE IF:  You develop a continual cough, associated with thick or bloody sputum. SEEK IMMEDIATE MEDICAL CARE IF:   You have a fever.   You have difficulty breathing.   You have nausea (feeling sick to your stomach), vomiting, or abdominal (belly) pain.   You have worsening pain, not controlled with medications.  Document Released: 06/10/2005 Document Revised: 05/30/2011 Document Reviewed: 11/12/2006 [employer] Patient Information 2012 [employer]. 

## 2011-09-24 ENCOUNTER — Encounter: Payer: Self-pay | Admitting: Internal Medicine

## 2011-09-24 NOTE — Progress Notes (Signed)
Dr Juanda Chance has received extensive records to review from [facility] and Dr Randa Evens. This was previously our patient but she switched care to Dr Roby Lofts [facility]. Dr Juanda Chance has reviewed these records and states that she declines to accept patient back into her practice but should continue to follow up with another GI Dr that has already been following her.

## 2011-10-03 ENCOUNTER — Ambulatory Visit (INDEPENDENT_AMBULATORY_CARE_PROVIDER_SITE_OTHER): Payer: No Typology Code available for payment source | Admitting: Family Medicine

## 2011-10-03 ENCOUNTER — Encounter: Payer: Self-pay | Admitting: Family Medicine

## 2011-10-03 VITALS — BP 110/68 | HR 94 | Temp 98.7°F | Wt 128.0 lb

## 2011-10-03 DIAGNOSIS — E041 Nontoxic single thyroid nodule: Secondary | ICD-10-CM

## 2011-10-03 DIAGNOSIS — R7309 Other abnormal glucose: Secondary | ICD-10-CM

## 2011-10-03 DIAGNOSIS — R739 Hyperglycemia, unspecified: Secondary | ICD-10-CM

## 2011-10-03 DIAGNOSIS — R1013 Epigastric pain: Secondary | ICD-10-CM

## 2011-10-03 LAB — T3, FREE: T3, Free: 3 pg/mL (ref 2.3–4.2)

## 2011-10-03 LAB — BASIC METABOLIC PANEL
BUN: 11 mg/dL (ref 6–23)
Calcium: 9.3 mg/dL (ref 8.4–10.5)
GFR: 97.18 mL/min (ref >60.00)
Glucose, Bld: 110 mg/dL — ABNORMAL HIGH (ref 70–99)
Potassium: 4.1 mEq/L (ref 3.5–5.1)
Sodium: 137 mEq/L (ref 135–145)

## 2011-10-03 LAB — CBC WITH DIFFERENTIAL/PLATELET
Basophils Absolute: 0.1 10*3/uL (ref 0.0–0.1)
Eosinophils Absolute: 0.1 10*3/uL (ref 0.0–0.7)
Hemoglobin: 12.8 g/dL (ref 12.0–15.0)
Lymphocytes Relative: 19.7 % (ref 12.0–46.0)
MCHC: 33.4 g/dL (ref 30.0–36.0)
Monocytes Relative: 7.9 % (ref 3.0–12.0)
Neutro Abs: 7.4 10*3/uL (ref 1.4–7.7)
Neutrophils Relative %: 70.2 % (ref 43.0–77.0)
RBC: 4.23 Mil/uL (ref 3.87–5.11)
RDW: 16.1 % — ABNORMAL HIGH (ref 11.5–14.6)

## 2011-10-03 LAB — TSH: TSH: 1.99 u[IU]/mL (ref 0.35–5.50)

## 2011-10-03 LAB — T4, FREE: Free T4: 0.71 ng/dL (ref 0.60–1.60)

## 2011-10-03 MED ORDER — OMEPRAZOLE 40 MG PO CPDR: 40.0000 mg | DELAYED_RELEASE_CAPSULE | Freq: Two times a day (BID) | ORAL | Status: DC

## 2011-10-03 MED ORDER — SUCRALFATE 1 GM/10ML PO SUSP: 1.0000 g | Freq: Four times a day (QID) | ORAL | Status: DC

## 2011-10-03 NOTE — Progress Notes (Signed)
  Subjective:    Patient ID: [name], female    DOB: Dec 07, 1950, 61 y.o.   MRN: [id number]  HPI Here for refills and questions. She needs more Carafate and Prilosec. Her chronic GI pain has been fairly stable. She had a recent biopsy of a thyroid nodule that turned out to be benign. She feels very tired all the time and wonders if she has a thyroid problem. Her weight is stable.    Review of Systems  Constitutional: Positive for fatigue. Negative for activity change, appetite change and unexpected weight change.  Gastrointestinal: Positive for abdominal pain. Negative for nausea, vomiting, diarrhea, constipation, blood in stool and abdominal distention.       Objective:   Physical Exam  Constitutional: She appears well-developed and well-nourished.  Neck: No thyromegaly present.  Abdominal: Soft. Bowel sounds are normal. She exhibits no distension and no mass. There is no tenderness. There is no rebound and no guarding.  Lymphadenopathy:    She has no cervical adenopathy.          Assessment & Plan:  Check labs including a thyroid panel. Refilled meds

## 2011-10-09 ENCOUNTER — Telehealth: Payer: Self-pay | Admitting: Family Medicine

## 2011-10-09 MED ORDER — OMEPRAZOLE 40 MG PO CPDR: 40.0000 mg | DELAYED_RELEASE_CAPSULE | Freq: Two times a day (BID) | ORAL | Status: DC

## 2011-10-09 NOTE — Telephone Encounter (Signed)
Pt requested a 90 day supply of Omeprazole 40 mg and [facility]

## 2011-10-10 ENCOUNTER — Encounter: Payer: Self-pay | Admitting: Family Medicine

## 2011-10-10 NOTE — Progress Notes (Signed)
Quick Note:  Spoke with pt and put a copy of results in mail. ______ 

## 2012-01-11 ENCOUNTER — Other Ambulatory Visit: Payer: Self-pay | Admitting: Family Medicine

## 2012-01-14 NOTE — Telephone Encounter (Signed)
Call in Zofran #90 with 5 rf. Also Prozac #180 with 3 rf

## 2012-02-28 ENCOUNTER — Telehealth: Payer: Self-pay | Admitting: Family Medicine

## 2012-02-28 DIAGNOSIS — R109 Unspecified abdominal pain: Secondary | ICD-10-CM

## 2012-02-28 NOTE — Telephone Encounter (Addendum)
Pt would like referral to Dr Kinnie Scales on [address] [phone] for stomach ulcers. Pt had remote history with Dr Juanda Chance and also saw Dr Karn Pickler GI

## 2012-02-28 NOTE — Telephone Encounter (Signed)
I spoke with pt  

## 2012-02-28 NOTE — Telephone Encounter (Signed)
The referral was done  

## 2012-02-28 NOTE — Telephone Encounter (Signed)
Pt req a referral to see Dr Tery Sanfilippo - GI doctor on [address].

## 2012-04-05 ENCOUNTER — Telehealth: Payer: Self-pay

## 2012-04-05 ENCOUNTER — Emergency Department (HOSPITAL_COMMUNITY)
Admission: EM | Admit: 2012-04-05 | Discharge: 2012-04-05 | Disposition: A | Discharge status: Home / Self Care | Payer: Medicare Other | Attending: Emergency Medicine | Admitting: Emergency Medicine

## 2012-04-05 ENCOUNTER — Emergency Department (HOSPITAL_COMMUNITY): Payer: Medicare Other

## 2012-04-05 ENCOUNTER — Encounter (HOSPITAL_COMMUNITY): Payer: Self-pay | Admitting: *Deleted

## 2012-04-05 DIAGNOSIS — R109 Unspecified abdominal pain: Secondary | ICD-10-CM | POA: Insufficient documentation

## 2012-04-05 DIAGNOSIS — G8929 Other chronic pain: Secondary | ICD-10-CM | POA: Insufficient documentation

## 2012-04-05 DIAGNOSIS — F17200 Nicotine dependence, unspecified, uncomplicated: Secondary | ICD-10-CM | POA: Insufficient documentation

## 2012-04-05 DIAGNOSIS — R110 Nausea: Secondary | ICD-10-CM | POA: Insufficient documentation

## 2012-04-05 DIAGNOSIS — K5900 Constipation, unspecified: Secondary | ICD-10-CM | POA: Insufficient documentation

## 2012-04-05 DIAGNOSIS — J45909 Unspecified asthma, uncomplicated: Secondary | ICD-10-CM | POA: Insufficient documentation

## 2012-04-05 DIAGNOSIS — K219 Gastro-esophageal reflux disease without esophagitis: Secondary | ICD-10-CM | POA: Insufficient documentation

## 2012-04-05 LAB — URINALYSIS, ROUTINE W REFLEX MICROSCOPIC
Glucose, UA: NEGATIVE mg/dL
Hgb urine dipstick: NEGATIVE
Ketones, ur: NEGATIVE mg/dL
Leukocytes, UA: NEGATIVE
pH: 6.5 (ref 5.0–8.0)

## 2012-04-05 LAB — CBC WITH DIFFERENTIAL/PLATELET
Eosinophils Absolute: 0.3 10*3/uL (ref 0.0–0.7)
Eosinophils Relative: 4 % (ref 0–5)
HCT: 37.3 % (ref 36.0–46.0)
Hemoglobin: 12.4 g/dL (ref 12.0–15.0)
Lymphs Abs: 2.6 10*3/uL (ref 0.7–4.0)
MCH: 28.8 pg (ref 26.0–34.0)
MCHC: 33.2 g/dL (ref 30.0–36.0)
MCV: 86.7 fL (ref 78.0–100.0)
Monocytes Absolute: 0.7 10*3/uL (ref 0.1–1.0)
Monocytes Relative: 9 % (ref 3–12)
Neutrophils Relative %: 54 % (ref 43–77)
RBC: 4.3 MIL/uL (ref 3.87–5.11)

## 2012-04-05 LAB — COMPREHENSIVE METABOLIC PANEL
ALT: 8 U/L (ref 0–35)
AST: 15 U/L (ref 0–37)
Albumin: 3.3 g/dL — ABNORMAL LOW (ref 3.5–5.2)
Calcium: 9 mg/dL (ref 8.4–10.5)
Creatinine, Ser: 0.56 mg/dL (ref 0.50–1.10)
Sodium: 137 mEq/L (ref 135–145)
Total Protein: 6.3 g/dL (ref 6.0–8.3)

## 2012-04-05 MED ORDER — PEG 3350-KCL-NABCB-NACL-NASULF 236 G PO SOLR: 4.0000 L | Freq: Once | ORAL | Status: DC

## 2012-04-05 NOTE — ED Provider Notes (Signed)
History     CSN: [id number]  Arrival date & time 04/05/12  1514   First MD Initiated Contact with Patient 04/05/12 1540      Chief Complaint  Patient presents with  . Abdominal Pain  . Constipation    (Consider location/radiation/quality/duration/timing/severity/associated sxs/prior treatment) HPI This 61 year old female has a history of chronic abdominal pain, chronic nausea, chronic constipation, and usually has bowel movements about once every 3 days or so, she now has not had a good bowel movement for the last 2 weeks, she used 2 enemas today and had only a small amount of hard stool, she's had no vomiting fever chest pain or shortness of breath. There is no change in her baseline abdominal pain which she has had for years. She states she has not been on chronic narcotics in months. There is no radiation of her baseline generalized abdominal pain. Past Medical History  Diagnosis Date  . Peptic ulcer disease     dr Randa Evens  . Gastric outlet obstruction   . Cancer   . Acute duodenal ulcer with hemorrhage and perforation, with obstruction   . Barrett's esophagus   . Menopause   . GERD (gastroesophageal reflux disease)   . Asthma   . Chronic abdominal pain     narcotic dependence, dr gyarteng-dak at [facility]  . Narcotic abuse   . Anxiety     sees Saul Fordyce NP at Dr. Danella Sensing office  . Fx two ribs-open 08-24-11    left 4th and 5th  . Fx of fibula 07-28-11    left fibula, 2 places     Past Surgical History  Procedure Date  . Esophagogastroduodenoscopy 12-29-09    dr qadeer at [facility], several gastric ulcers  . Vagotomy     lapaproscopic - [facility]  . 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 Hernia repair   . Perforated ulcer   . Biopsy thyroid 08-13-11    benign nodule, per Dr. Christia Reading     Family History  Problem Relation Age of Onset  . Cancer Mother     kidney, magliant tumor on face  . Celiac disease Father     History  Substance Use  Topics  . Smoking status: Current Every Day Smoker -- 2.0 packs/day for 44 years    Types: Cigarettes  . Smokeless tobacco: Never Used   Comment: Using Electric Cigarette  . Alcohol Use: No    OB History    Grav Para Term Preterm Abortions TAB SAB Ect Mult Living                  Review of Systems 10 Systems reviewed and are negative for acute change except as noted in the HPI. Allergies  Alprazolam; Morphine; Quetiapine; and Varenicline tartrate  Home Medications   Current Outpatient Rx  Name Route Sig Dispense Refill  . FLUOXETINE HCL 40 MG PO CAPS  TAKE 2 CAPSULES BY MOUTH EVERY DAY 180 capsule 3  . OMEPRAZOLE 40 MG PO CPDR Oral Take 1 capsule (40 mg total) by mouth 2 (two) times daily. 180 capsule 3  . PEG 3350-KCL-NABCB-NACL-NASULF 236 G PO SOLR Oral Take 4,000 mLs by mouth once. 4000 mL 0    BP 135/85  Pulse 76  Temp 98 F (36.7 C) (Oral)  Resp 14  SpO2 100%  Physical Exam  Nursing note and vitals reviewed. Constitutional:       Awake, alert, nontoxic appearance.  HENT:  Head: Atraumatic.  Eyes: Right eye exhibits no discharge. Left eye exhibits no discharge.  Neck: Neck supple.  Cardiovascular: Normal rate and regular rhythm.   No murmur heard. Pulmonary/Chest: Effort normal and breath sounds normal. No respiratory distress. She has no wheezes. She has no rales. She exhibits no tenderness.  Abdominal: Soft. Bowel sounds are normal. She exhibits no distension and no mass. There is tenderness. There is no rebound and no guarding.       She is minimal diffuse tenderness without rebound  Genitourinary:       Chaperone present for rectal examination revealing an empty rectal vault with no tenderness  Musculoskeletal: She exhibits no tenderness.       Baseline ROM, no obvious new focal weakness.  Neurological: She is alert.       Mental status and motor strength appears baseline for patient and situation.  Skin: No rash noted.  Psychiatric: She has a normal  mood and affect.    ED Course  Procedures (including critical care time)  Labs Reviewed  COMPREHENSIVE METABOLIC PANEL - Abnormal; Notable for the following:    Albumin 3.3 (*)     Total Bilirubin 0.2 (*)     All other components within normal limits  URINALYSIS, ROUTINE W REFLEX MICROSCOPIC - Abnormal; Notable for the following:    Specific Gravity, Urine 1.003 (*)     All other components within normal limits  CBC WITH DIFFERENTIAL  LIPASE, BLOOD  LAB REPORT - SCANNED   Dg Abd Acute W/chest  04/05/2012  *RADIOLOGY REPORT*  Clinical Data: Abdominal pain.  Constipation.  ACUTE ABDOMEN SERIES (ABDOMEN 2 VIEW & CHEST 1 VIEW)  Comparison: Acute abdominal series 02/22/2009.  Findings: Lung volumes are normal.  No consolidative airspace disease.  No pleural effusions.  No pneumothorax.  No pulmonary nodule or mass noted.  Pulmonary vasculature and the cardiomediastinal silhouette are within normal limits. Atherosclerotic calcifications are noted within the arch of the aorta.  Multiple surgical clips near the gastroesophageal junction.  Gas and stool is seen scattered throughout the colon extending to the level of the distal rectum.  No pathologic distension of small bowel is noted. A suture line is visualized in the left upper quadrant of the abdomen.  No gross evidence of pneumoperitoneum. Orthopedic fixation hardware is incompletely visualized in the proximal femurs bilaterally.  IMPRESSION: 1.  Nonobstructive bowel gas pattern. 2.  No pneumoperitoneum. 3.  No radiographic evidence of acute cardiopulmonary disease. 4.  Atherosclerosis. 5.  Postoperative changes, as above.   Original Report Authenticated By: Florencia Reasons, M.D.      1. Chronic abdominal pain   2. Constipation       MDM  Patient / Family / Caregiver understand and agree with initial ED impression and plan with expectations set for ED visit.  Pt stable in ED with no significant deterioration in condition.  Patient /  Family / Caregiver informed of clinical course, understand medical decision-making process, and agree with plan.  I doubt any other EMC precluding discharge at this time including, but not necessarily limited to the following:SBI.         Hurman Horn, MD 04/06/12 [phone]

## 2012-04-05 NOTE — ED Notes (Signed)
Patient transported to X-ray 

## 2012-04-05 NOTE — ED Notes (Signed)
Pt presents to ed c/o abdominal pain and constipation with nausea. Pt reports hx of gastroparesis an sts has seen her GI doctor a month ago who performed endoscopy but wasn't able to do anything due to presence of food in her stomach. Pt sts she has two enemas this am without results.

## 2012-04-05 NOTE — Telephone Encounter (Signed)
patient is worried because she has hx of ulcer and gastropheresis and is having constipation. She has used 2 fleets enemas and nothing. Please advise.

## 2012-04-05 NOTE — Telephone Encounter (Signed)
Pt wants to talk to someone regarding extreme constipation issues

## 2012-04-05 NOTE — ED Notes (Signed)
MD at bedside. 

## 2012-04-05 NOTE — ED Notes (Addendum)
Reports has not been had a BM for a week. +nausea (chronic). Simarly episode 3 years ago dx with gastroparesis.  Tried to use OTC enemasx2  without relief.

## 2012-04-05 NOTE — ED Provider Notes (Signed)
MSE was initiated and I personally evaluated the patient and placed orders (if any) at  3:28 PM on April 05, 2012.  The patient appears stable so that the remainder of the MSE may be completed by another provider.  Pt here w constipation x 2 wee ks. Hx of gastroparesis. Labs and imaging ordered pending room placement in back.    Jaci Carrel, [place] 04/05/12 1529

## 2012-04-05 NOTE — ED Notes (Signed)
Discharge instructions reviewed w/ pt., verbalizes understanding. One prescription provided at discharge. 

## 2012-04-06 NOTE — Telephone Encounter (Signed)
She can use a large dose of Miralax: Mix 10 doses in 64 ounces of Gatorade.  But It looks like Dr. Lestine Box sent in an RX of Golytely for her yesterday.

## 2012-04-06 NOTE — Telephone Encounter (Signed)
She was seen in ER, and using Golytley helping some, she is advised of Miralax / Gatorade combo also, since she states the [place] tastes terrible.

## 2012-04-11 NOTE — ED Provider Notes (Signed)
Medical screening examination/treatment/procedure(s) were conducted as a shared visit with non-physician practitioner(s) and myself.  I personally evaluated the patient during the encounter  Hurman Horn, MD 04/11/12 [phone]

## 2012-04-12 IMAGING — CR DG CHEST 2V
2 series · 2 of 2 positions shown · non-contrast
Comparison: None.

CLINICAL DATA: Cough

CHEST - 2 VIEW

[PA]
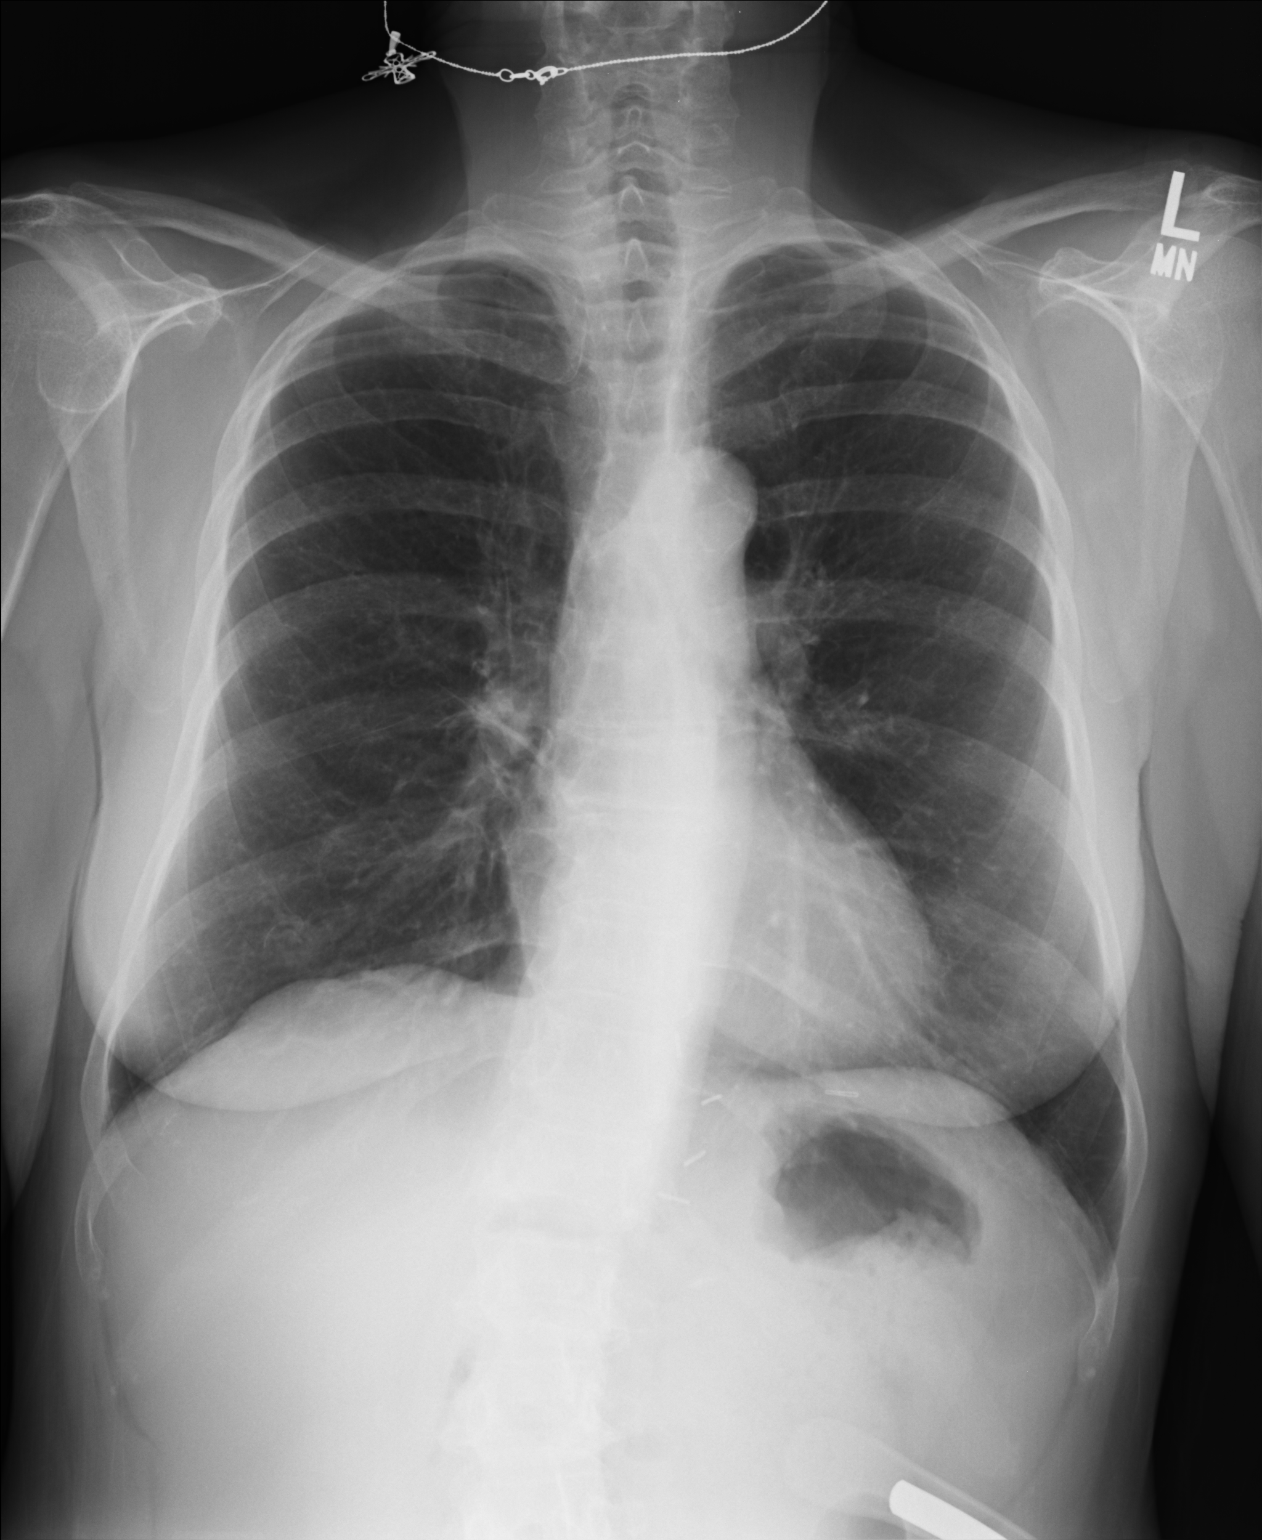

[lateral]
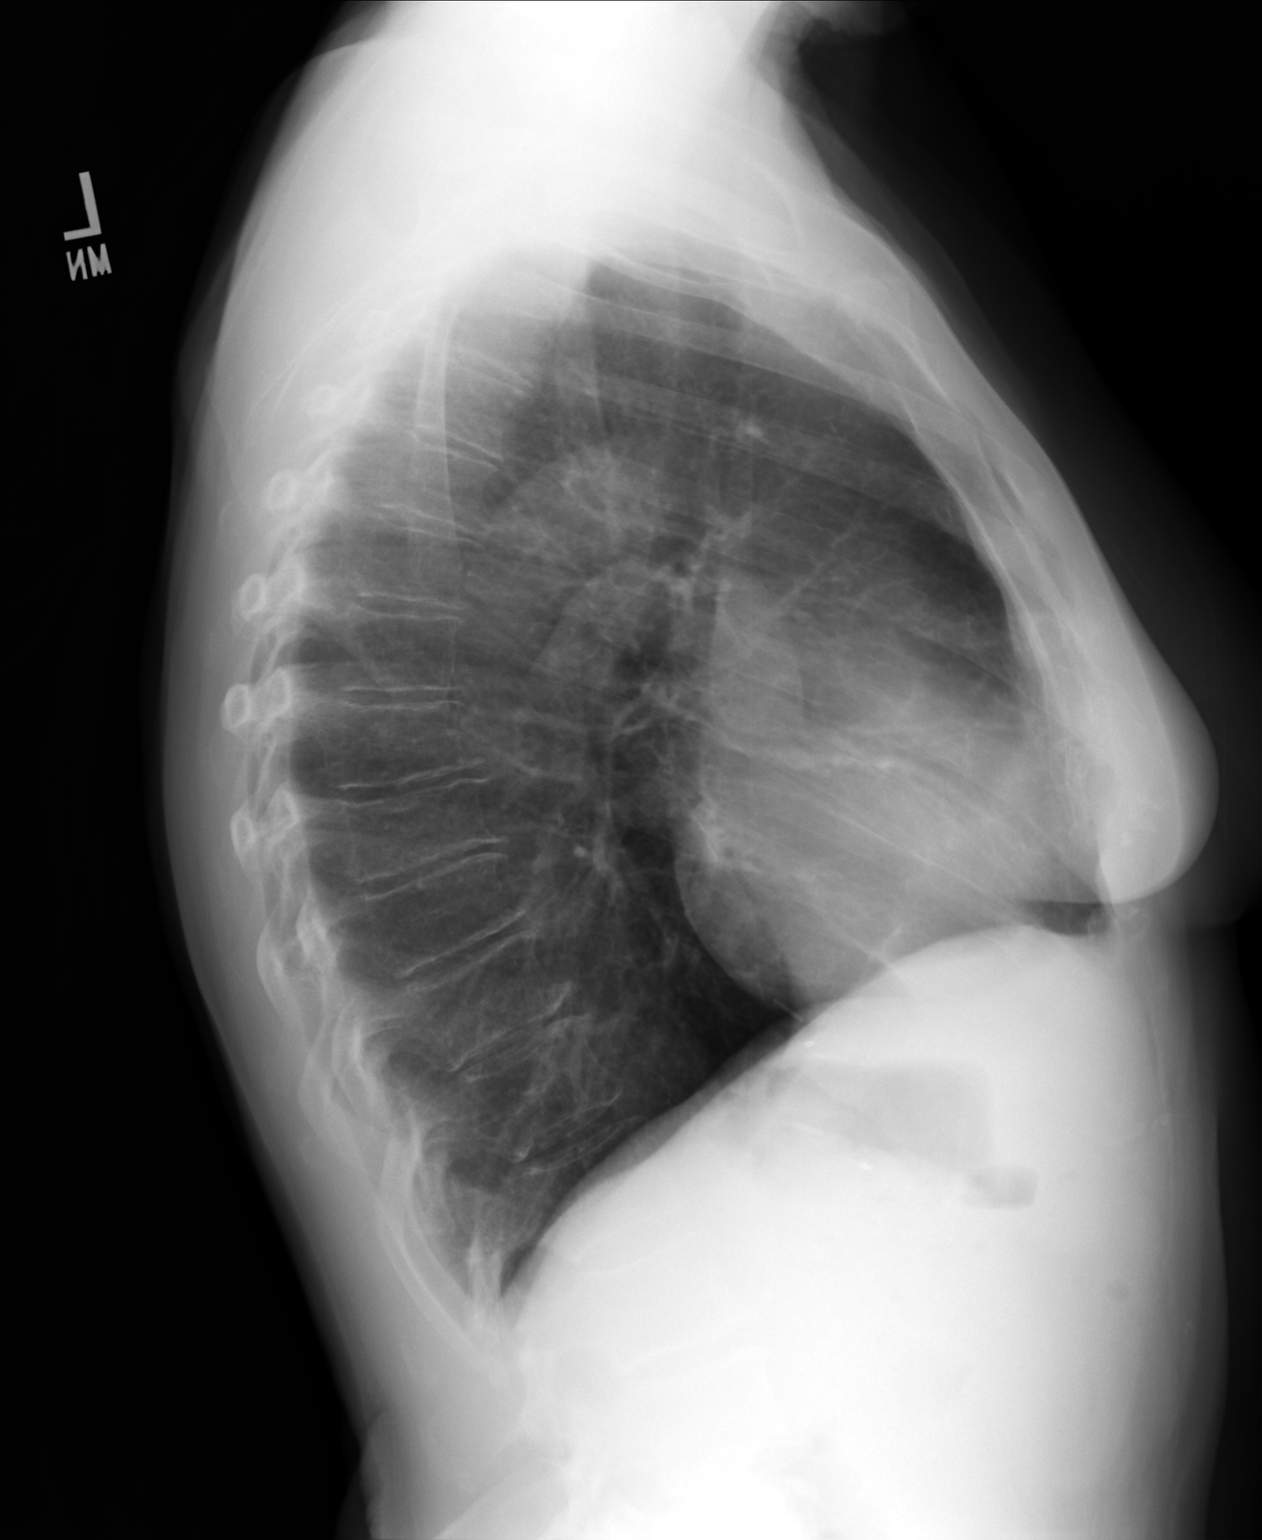

[2 of 2 positions shown; findings below may reference images not displayed]

FINDINGS: Normal mediastinum and heart silhouette.  Lungs are
clear.  No effusion, infiltrate, or pneumothorax.  Sigmoid
scoliosis noted.
IMPRESSION: No acute cardiopulmonary process.

## 2012-04-16 ENCOUNTER — Ambulatory Visit (INDEPENDENT_AMBULATORY_CARE_PROVIDER_SITE_OTHER): Payer: Medicare Other | Admitting: Family Medicine

## 2012-04-16 ENCOUNTER — Ambulatory Visit: Payer: Medicare Other

## 2012-04-16 VITALS — BP 144/82 | HR 86 | Temp 98.6°F | Resp 17 | Ht 63.5 in | Wt 130.0 lb

## 2012-04-16 DIAGNOSIS — J4489 Other specified chronic obstructive pulmonary disease: Secondary | ICD-10-CM

## 2012-04-16 DIAGNOSIS — J449 Chronic obstructive pulmonary disease, unspecified: Secondary | ICD-10-CM

## 2012-04-16 DIAGNOSIS — R062 Wheezing: Secondary | ICD-10-CM

## 2012-04-16 DIAGNOSIS — R05 Cough: Secondary | ICD-10-CM

## 2012-04-16 DIAGNOSIS — IMO0001 Reserved for inherently not codable concepts without codable children: Secondary | ICD-10-CM

## 2012-04-16 DIAGNOSIS — R059 Cough, unspecified: Secondary | ICD-10-CM

## 2012-04-16 MED ORDER — METHYLPREDNISOLONE SODIUM SUCC 125 MG IJ SOLR
125.0000 mg | Freq: Once | INTRAMUSCULAR | Status: AC
  Administered 2012-04-16: 125 mg via INTRAMUSCULAR

## 2012-04-16 MED ORDER — METHYLPREDNISOLONE 4 MG PO KIT: PACK | ORAL | Status: DC

## 2012-04-16 MED ORDER — BENZONATATE 100 MG PO CAPS: 200.0000 mg | ORAL_CAPSULE | Freq: Two times a day (BID) | ORAL | Status: AC | PRN

## 2012-04-16 NOTE — Progress Notes (Signed)
 [facility]:  Office Visit  Chief Complaint:  Chief Complaint  Patient presents with  . Nasal Congestion  . Chest Pain    congestion in chest     HPI: [name] is a 61 y.o. female who complains of 2 week history of sinus congestion and chest congestion since going to ER with mom for bee stings, she noticed she was having sinusitis sxs since then .Denies fevers, chill. + Dry cough. H/o asthma and COPD. Has tried albuterol nebs at home 3-4 times a day without relief. + Wheezing. Denies more acute SOB. Denies edema in . She has sinus tenderness, and HA. She continues to smoke. Has a h.o COPD not oxygen dependent . Tried otc meds without relief  Past Medical History  Diagnosis Date  . Peptic ulcer disease     dr Randa Evens  . Gastric outlet obstruction   . Cancer   . Acute duodenal ulcer with hemorrhage and perforation, with obstruction   . Barrett's esophagus   . Menopause   . GERD (gastroesophageal reflux disease)   . Asthma   . Chronic abdominal pain     narcotic dependence, dr gyarteng-dak at [facility]  . Narcotic abuse   . Anxiety     sees Saul Fordyce NP at Dr. Danella Sensing office  . Fx two ribs-open 08-24-11    left 4th and 5th  . Fx of fibula 07-28-11    left fibula, 2 places   . COPD (chronic obstructive pulmonary disease)    Past Surgical History  Procedure Date  . Esophagogastroduodenoscopy 12-29-09    dr qadeer at [facility], several gastric ulcers  . Vagotomy     lapaproscopic - [facility]  . 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 Hernia repair   . Perforated ulcer   . Biopsy thyroid 08-13-11    benign nodule, per Dr. Christia Reading    History   Social History  . Marital Status: Divorced    Spouse Name: N/A    Number of Children: N/A  . Years of Education: N/A   Social History Main Topics  . Smoking status: Current Every Day Smoker -- 2.0 packs/day for 45 years    Types: Cigarettes  . Smokeless tobacco: Never Used   Comment: Using Electric Cigarette  . Alcohol Use: No  . Drug Use: No  . Sexually Active: None   Other Topics Concern  . None   Social History Narrative  . None   Family History  Problem Relation Age of Onset  . Cancer Mother     kidney, magliant tumor on face  . Celiac disease Father    Allergies  Allergen Reactions  . Alprazolam     REACTION: unspecified  . Morphine     REACTION: itching  . Quetiapine     REACTION: tardive dyskinesia  . Varenicline Tartrate     REACTION: unspecified   Prior to Admission medications   Medication Sig Start Date End Date Taking? Authorizing Provider  pseudoephedrine-guaifenesin (MUCINEX D) 60-600 MG per tablet Take 1 tablet by mouth every 12 (twelve) hours.   Yes Historical Provider, MD  FLUoxetine (PROZAC) 40 MG capsule TAKE 2 CAPSUS BY MOUTH EVERY DAY 01/11/12   Nelwyn Salisbury, MD  omeprazole (PRILOSEC) 40 MG capsule Take 1 capsule (40 mg total) by mouth 2 (two) times daily. 10/09/11   Nelwyn Salisbury, MD  polyethylene glycol (GOLYTELY) 236 G solution Take 4,000 mLs by mouth once.  04/05/12   Hurman Horn, MD     ROS: The patient denies fevers, chills, night sweats, unintentional weight loss, chest pain, palpitations, nausea, vomiting, abdominal pain, dysuria, hematuria, melena, numbness, weakness, or tingling. +  wheezing, dyspnea on exertion,  All other systems have been reviewed and were otherwise negative with the exception of those mentioned in the HPI and as above.    PHYSICAL EXAM: Filed Vitals:   04/16/12 1414  BP: 144/82  Pulse: 86  Temp: 98.6 F (37 C)  Resp: 17   Filed Vitals:   04/16/12 1414  Height: 5' 3.5" (1.613 m)  Weight: 130 lb (58.968 kg)   Body mass index is 22.67 kg/(m^2).  General: Alert, no acute distress HEENT:  Normocephalic, atraumatic, oropharynx patent.  Cardiovascular:  Regular rate and rhythm, no rubs murmurs or gallops.  No Carotid bruits, radial pulse intact. No pedal edema.  Respiratory:   +  wheezes, No rales, or rhonchi.  No cyanosis, no use of accessory musculature GI: No organomegaly, abdomen is soft and non-tender, positive bowel sounds.  No masses. Skin: No rashes. Neurologic: Facial musculature symmetric. Psychiatric: Patient is appropriate throughout our interaction. Lymphatic: No cervical lymphadenopathy Musculoskeletal: Gait intact.   LABS: Results for orders placed during the hospital encounter of 04/05/12  COMPREHENSIVE METABOLIC PANEL      Component Value Range   Sodium 137  135 - 145 mEq/L   Potassium 3.7  3.5 - 5.1 mEq/L   Chloride 104  96 - 112 mEq/L   CO2 22  19 - 32 mEq/L   Glucose, Bld 91  70 - 99 mg/dL   BUN 6  6 - 23 mg/dL   Creatinine, Ser 1.61  0.50 - 1.10 mg/dL   Calcium 9.0  8.4 - 09.6 mg/dL   Total Protein 6.3  6.0 - 8.3 g/dL   Albumin 3.3 (*) 3.5 - 5.2 g/dL   AST 15  0 - 37 U/L   ALT 8  0 - 35 U/L   Alkaline Phosphatase 88  39 - 117 U/L   Total Bilirubin 0.2 (*) 0.3 - 1.2 mg/dL   GFR calc non Af Amer >90  >90 mL/min   GFR calc Af Amer >90  >90 mL/min  CBC WITH DIFFERENTIAL      Component Value Range   WBC 8.1  4.0 - 10.5 K/uL   RBC 4.30  3.87 - 5.11 MIL/uL   Hemoglobin 12.4  12.0 - 15.0 g/dL   HCT 04.5  40.9 - 81.1 %   MCV 86.7  78.0 - 100.0 fL   MCH 28.8  26.0 - 34.0 pg   MCHC 33.2  30.0 - 36.0 g/dL   RDW 91.4  78.2 - 95.6 %   Platelets 339  150 - 400 K/uL   Neutrophils Relative 54  43 - 77 %   Neutro Abs 4.3  1.7 - 7.7 K/uL   Lymphocytes Relative 32  12 - 46 %   Lymphs Abs 2.6  0.7 - 4.0 K/uL   Monocytes Relative 9  3 - 12 %   Monocytes Absolute 0.7  0.1 - 1.0 K/uL   Eosinophils Relative 4  0 - 5 %   Eosinophils Absolute 0.3  0.0 - 0.7 K/uL   Basophils Relative 1  0 - 1 %   Basophils Absolute 0.1  0.0 - 0.1 K/uL  LIPASE, BLOOD      Component Value Range   Lipase 26  11 - 59 U/L  URINALYSIS, ROUTINE  W REFX MICROSCOPIC      Component Value Range   Color, Urine YELLOW  YELLOW   APPearance CAR  CAR   Specific Gravity,  Urine 1.003 (*) 1.005 - 1.030   pH 6.5  5.0 - 8.0   Glucose, UA NEGATIVE  NEGATIVE mg/dL   Hgb urine dipstick NEGATIVE  NEGATIVE   Bilirubin Urine NEGATIVE  NEGATIVE   Ketones, ur NEGATIVE  NEGATIVE mg/dL   Protein, ur NEGATIVE  NEGATIVE mg/dL   Urobilinogen, UA 0.2  0.0 - 1.0 mg/dL   Nitrite NEGATIVE  NEGATIVE   Leukocytes, UA NEGATIVE  NEGATIVE     EKG/XRAY:   Primary read interpreted by [facility] Conley [facility]s [facility]. No pneumothorax, no effusion, no infiltrates   ASSESSMENT/PLAN: Encounter Diagnoses  Name Primary?  . Cough Yes  . Wheezing   . COPD bronchitis     Rx Solumedrol 125 mg IM Rx Medrol dose pack, Hydromet and Tessalon Perles  Rx z pack to be taken  if no sxs improvement in 2-3 days.  Patient declined staying for albuterol/atrovent nebs    ,  PHUONG, DO 04/16/2012 3:20 PM

## 2012-04-18 NOTE — Patient Instructions (Signed)

## 2012-04-20 ENCOUNTER — Other Ambulatory Visit: Payer: Self-pay | Admitting: Family Medicine

## 2012-04-30 ENCOUNTER — Ambulatory Visit (INDEPENDENT_AMBULATORY_CARE_PROVIDER_SITE_OTHER): Payer: Medicare Other | Admitting: Family Medicine

## 2012-04-30 ENCOUNTER — Encounter: Payer: Self-pay | Admitting: Family Medicine

## 2012-04-30 VITALS — BP 120/70 | HR 92 | Temp 98.9°F | Wt 132.0 lb

## 2012-04-30 DIAGNOSIS — J40 Bronchitis, not specified as acute or chronic: Secondary | ICD-10-CM

## 2012-04-30 MED ORDER — HYDROCODONE-HOMATROPINE 5-1.5 MG/5ML PO SYRP: 5.0000 mL | ORAL_SOLUTION | ORAL | Status: AC | PRN

## 2012-04-30 MED ORDER — LEVOFLOXACIN 500 MG PO TABS: 500.0000 mg | ORAL_TABLET | Freq: Every day | ORAL | Status: AC

## 2012-04-30 NOTE — Progress Notes (Signed)
  Subjective:    Patient ID: [name], female    DOB: 02-Mar-1951, 61 y.o.   MRN: [id number]  HPI Here for continued chest symptoms. For 2 weeks she has had chest tightness, PND, and coughing up yellow sputum. No chest pain ir fever. She went to Urgent Care recently and was given a Zpack and steroids. This helped a little but not much. Drinking fluids.    Review of Systems  Constitutional: Negative.   HENT: Negative.   Eyes: Negative.   Respiratory: Positive for cough, chest tightness and shortness of breath. Negative for wheezing.        Objective:   Physical Exam  Constitutional: She appears well-developed and well-nourished.  Cardiovascular: Normal rate, regular rhythm, normal heart sounds and intact distal pulses.   Pulmonary/Chest: Effort normal and breath sounds normal. No respiratory distress. She has no wheezes. She has no rales.  Lymphadenopathy:    She has no cervical adenopathy.          Assessment & Plan:  Partially treated bronchitis. Treat with Levaquin.

## 2012-06-25 ENCOUNTER — Other Ambulatory Visit: Payer: Self-pay | Admitting: Family Medicine

## 2012-06-25 NOTE — Telephone Encounter (Signed)
I left voice message, Dr. Clent Ridges is out of the office until 06/29/12. If she needs this before that time then she should call back, if not then we will handle next week.

## 2012-09-09 ENCOUNTER — Other Ambulatory Visit: Payer: Self-pay | Admitting: Gastroenterology

## 2012-09-09 DIAGNOSIS — K279 Peptic ulcer, site unspecified, unspecified as acute or chronic, without hemorrhage or perforation: Secondary | ICD-10-CM

## 2012-09-10 ENCOUNTER — Emergency Department (HOSPITAL_COMMUNITY): Payer: Medicare Other

## 2012-09-10 ENCOUNTER — Emergency Department (HOSPITAL_COMMUNITY)
Admission: EM | Admit: 2012-09-10 | Discharge: 2012-09-10 | Disposition: A | Discharge status: Home / Self Care | Payer: Medicare Other | Attending: Emergency Medicine | Admitting: Emergency Medicine

## 2012-09-10 ENCOUNTER — Encounter (HOSPITAL_COMMUNITY): Payer: Self-pay | Admitting: Emergency Medicine

## 2012-09-10 DIAGNOSIS — R630 Anorexia: Secondary | ICD-10-CM | POA: Insufficient documentation

## 2012-09-10 DIAGNOSIS — Z859 Personal history of malignant neoplasm, unspecified: Secondary | ICD-10-CM | POA: Insufficient documentation

## 2012-09-10 DIAGNOSIS — J449 Chronic obstructive pulmonary disease, unspecified: Secondary | ICD-10-CM | POA: Insufficient documentation

## 2012-09-10 DIAGNOSIS — G8929 Other chronic pain: Secondary | ICD-10-CM | POA: Insufficient documentation

## 2012-09-10 DIAGNOSIS — Z9884 Bariatric surgery status: Secondary | ICD-10-CM | POA: Insufficient documentation

## 2012-09-10 DIAGNOSIS — Z8739 Personal history of other diseases of the musculoskeletal system and connective tissue: Secondary | ICD-10-CM | POA: Insufficient documentation

## 2012-09-10 DIAGNOSIS — Z9889 Other specified postprocedural states: Secondary | ICD-10-CM | POA: Insufficient documentation

## 2012-09-10 DIAGNOSIS — Z8719 Personal history of other diseases of the digestive system: Secondary | ICD-10-CM | POA: Insufficient documentation

## 2012-09-10 DIAGNOSIS — Z8781 Personal history of (healed) traumatic fracture: Secondary | ICD-10-CM | POA: Insufficient documentation

## 2012-09-10 DIAGNOSIS — K219 Gastro-esophageal reflux disease without esophagitis: Secondary | ICD-10-CM | POA: Insufficient documentation

## 2012-09-10 DIAGNOSIS — F17200 Nicotine dependence, unspecified, uncomplicated: Secondary | ICD-10-CM | POA: Insufficient documentation

## 2012-09-10 DIAGNOSIS — J4489 Other specified chronic obstructive pulmonary disease: Secondary | ICD-10-CM | POA: Insufficient documentation

## 2012-09-10 DIAGNOSIS — R109 Unspecified abdominal pain: Secondary | ICD-10-CM

## 2012-09-10 DIAGNOSIS — Z8711 Personal history of peptic ulcer disease: Secondary | ICD-10-CM | POA: Insufficient documentation

## 2012-09-10 DIAGNOSIS — R1013 Epigastric pain: Secondary | ICD-10-CM | POA: Insufficient documentation

## 2012-09-10 DIAGNOSIS — F411 Generalized anxiety disorder: Secondary | ICD-10-CM | POA: Insufficient documentation

## 2012-09-10 LAB — CBC WITH DIFFERENTIAL/PLATELET
Eosinophils Relative: 0 % (ref 0–5)
HCT: 36.7 % (ref 36.0–46.0)
Lymphocytes Relative: 6 % — ABNORMAL LOW (ref 12–46)
Lymphs Abs: 0.7 10*3/uL (ref 0.7–4.0)
MCV: 81.6 fL (ref 78.0–100.0)
Monocytes Absolute: 0.3 10*3/uL (ref 0.1–1.0)
Monocytes Relative: 2 % — ABNORMAL LOW (ref 3–12)
RBC: 4.5 MIL/uL (ref 3.87–5.11)
WBC: 12.7 10*3/uL — ABNORMAL HIGH (ref 4.0–10.5)

## 2012-09-10 LAB — BASIC METABOLIC PANEL
CO2: 22 mEq/L (ref 19–32)
Calcium: 9.2 mg/dL (ref 8.4–10.5)
Creatinine, Ser: 0.9 mg/dL (ref 0.50–1.10)
Glucose, Bld: 134 mg/dL — ABNORMAL HIGH (ref 70–99)

## 2012-09-10 LAB — URINALYSIS, ROUTINE W REFLEX MICROSCOPIC
Glucose, UA: NEGATIVE mg/dL
Hgb urine dipstick: NEGATIVE
Ketones, ur: 40 mg/dL — AB
Protein, ur: NEGATIVE mg/dL

## 2012-09-10 LAB — HEPATIC FUNCTION PANEL
ALT: 7 U/L (ref 0–35)
Alkaline Phosphatase: 88 U/L (ref 39–117)
Total Protein: 7.4 g/dL (ref 6.0–8.3)

## 2012-09-10 MED ORDER — FENTANYL CITRATE 0.05 MG/ML IJ SOLN
50.0000 ug | Freq: Once | INTRAMUSCULAR | Status: AC
  Administered 2012-09-10: 50 ug via INTRAVENOUS
  Filled 2012-09-10: qty 2

## 2012-09-10 MED ORDER — DICYCLOMINE HCL 20 MG PO TABS: 20.0000 mg | ORAL_TABLET | Freq: Two times a day (BID) | ORAL | Status: DC

## 2012-09-10 MED ORDER — ONDANSETRON HCL 4 MG/2ML IJ SOLN
4.0000 mg | Freq: Once | INTRAMUSCULAR | Status: AC
  Administered 2012-09-10: 4 mg via INTRAVENOUS
  Filled 2012-09-10: qty 2

## 2012-09-10 NOTE — ED Notes (Signed)
Pt c/o abd pain, requesting pain meds. Made EDP Knapp aware. No new orders at this time.

## 2012-09-10 NOTE — ED Notes (Signed)
Attempted lab draw and patient jerked arm and the needle came out. Patient requesting a bed, once in a room will attempt lab draw again.

## 2012-09-10 NOTE — ED Provider Notes (Signed)
History     CSN: [id number]  Arrival date & time 09/10/12  1418   First MD Initiated Contact with Patient 09/10/12 1747      Chief Complaint  Patient presents with  . Abdominal Pain   HPI Comments: Pt has history of chronic abdominal issues, gastroparesis, and ulcers.  She saw Dr. Elnoria Howard in the office yesterday.  He has plans to do an endospcopy.  Pt did not get any prescriptions.  Today she felt worse so she came to the ED.  Patient is a 62 y.o. female presenting with abdominal pain. The history is provided by the patient.  Abdominal Pain Pain location:  Epigastric Pain quality: squeezing   Pain quality comment:  And feels like needles Pain radiates to:  Does not radiate Duration:  1 day Timing:  Constant Progression:  Worsening Chronicity:  Recurrent Relieved by:  Nothing Worsened by:  Nothing tried Associated symptoms: anorexia   Associated symptoms: no belching, no chest pain, no cough, no fever and no shortness of breath     Past Medical History  Diagnosis Date  . Peptic ulcer disease     dr Randa Evens  . Gastric outlet obstruction   . Cancer   . Acute duodenal ulcer with hemorrhage and perforation, with obstruction   . Barrett's esophagus   . Menopause   . GERD (gastroesophageal reflux disease)   . Asthma   . Chronic abdominal pain     narcotic dependence, dr gyarteng-dak at [facility]  . Narcotic abuse   . Anxiety     sees Saul Fordyce NP at Dr. Danella Sensing office  . Fx two ribs-open 08-24-11    left 4th and 5th  . Fx of fibula 07-28-11    left fibula, 2 places   . COPD (chronic obstructive pulmonary disease)     Past Surgical History  Procedure Laterality Date  . Esophagogastroduodenoscopy  12-29-09    dr qadeer at [facility], several gastric ulcers  . Vagotomy      lapaproscopic - [facility]  . 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 Hernia repair    . Perforated ulcer    . Biopsy thyroid  08-13-11    benign nodule, per Dr. Christia Reading      Family History  Problem Relation Age of Onset  . Cancer Mother     kidney, magliant tumor on face  . Celiac disease Father     History  Substance Use Topics  . Smoking status: Current Every Day Smoker -- 2.00 packs/day for 45 years    Types: Cigarettes  . Smokeless tobacco: Never Used  . Alcohol Use: No    OB History   Grav Para Term Preterm Abortions TAB SAB Ect Mult Living                  Review of Systems  Constitutional: Negative for fever.  Respiratory: Negative for cough and shortness of breath.   Cardiovascular: Negative for chest pain.  Gastrointestinal: Positive for abdominal pain and anorexia.    Allergies  Alprazolam; Morphine; Quetiapine; and Varenicline tartrate  Home Medications   Current Outpatient Rx  Name  Route  Sig  Dispense  Refill  . FLUoxetine (PROZAC) 40 MG capsule      TAKE 2 CAPSULES BY MOUTH EVERY DAY   180 capsule   3   . omeprazole (PRILOSEC) 40 MG capsule   Oral   Take 1 capsule (40 mg total) by mouth 2 (  two) times daily.   180 capsule   3   . ondansetron (ZOFRAN) 8 MG tablet      TAKE 1 TABLET (8 MG TOTAL) BY MOUTH EVERY 8 (EIGHT) HOURS AS NEEDED FOR NAUSEA.   90 tablet   5   . dicyclomine (BENTYL) 20 MG tablet   Oral   Take 1 tablet (20 mg total) by mouth 2 (two) times daily.   20 tablet   0     BP 152/60  Pulse 79  Temp(Src) 98.1 F (36.7 C) (Oral)  Resp 15  SpO2 96%  Physical Exam  Nursing note and vitals reviewed. Constitutional: She appears well-developed and well-nourished. No distress.  HENT:  Head: Normocephalic and atraumatic.  Right Ear: External ear normal.  Left Ear: External ear normal.  Eyes: Conjunctivae are normal. Right eye exhibits no discharge. Left eye exhibits no discharge. No scleral icterus.  Neck: Neck supple. No tracheal deviation present.  Cardiovascular: Normal rate, regular rhythm and intact distal pulses.   Pulmonary/Chest: Effort normal and breath sounds normal. No stridor.  No respiratory distress. She has no wheezes. She has no rales.  Abdominal: Soft. Bowel sounds are normal. She exhibits no distension. There is tenderness in the epigastric area. There is no rebound and no guarding.  Musculoskeletal: She exhibits no edema and no tenderness.  Neurological: She is alert. She has normal strength. No sensory deficit. Cranial nerve deficit:  no gross defecits noted. She exhibits normal muscle tone. She displays no seizure activity. Coordination normal.  Skin: Skin is warm and dry. No rash noted.  Psychiatric: She has a normal mood and affect.    ED Course  Procedures (including critical care time)  Labs Reviewed  CBC WITH DIFFERENTIAL - Abnormal; Notable for the following:    WBC 12.7 (*)    Hemoglobin 11.6 (*)    MCH 25.8 (*)    RDW 16.3 (*)    Platelets 436 (*)    Neutrophils Relative 91 (*)    Neutro Abs 11.6 (*)    Lymphocytes Relative 6 (*)    Monocytes Relative 2 (*)    All other components within normal limits  BASIC METABOLIC PANEL - Abnormal; Notable for the following:    Glucose, Bld 134 (*)    GFR calc non Af Amer 68 (*)    GFR calc Af Amer 79 (*)    All other components within normal limits  URINALYSIS, ROUTINE W REFLEX MICROSCOPIC - Abnormal; Notable for the following:    APPearance CLOUDY (*)    Ketones, ur 40 (*)    All other components within normal limits  HEPATIC FUNCTION PANEL - Abnormal; Notable for the following:    Total Bilirubin 0.1 (*)    All other components within normal limits  LIPASE, BLOOD   Dg Abd Acute W/chest  09/10/2012  *RADIOLOGY REPORT*  Clinical Data: Abdominal pain.  History of perforated ulcer in 2009.  ACUTE ABDOMEN SERIES (ABDOMEN 2 VIEW & CHEST 1 VIEW)  Comparison: Acute abdomen series 04/05/2012.  Two-view chest x-ray 04/16/2012.  Findings: Bowel gas pattern unremarkable without evidence of obstruction or significant ileus.  No evidence of free air or significant air fluid levels on the erect image.   Surgical suture material in the left upper abdomen.  Phlebolith in the right side of the low pelvis.  No visible opaque urinary tract calculi.  Prior ORIF of bilateral femoral neck fractures.  Generalized osseous demineralization.  Degenerative changes involving the lower lumbar spine.  Cardiac silhouette normal in size, unchanged.  Thoracic aorta atherosclerotic, unchanged.  Linear scarring in the right perihilar region.  Lungs hyperinflated but otherwise clear.  IMPRESSION:  1.  No acute abdominal abnormality. 2.  Hyperinflation consistent with COPD and/or asthma.  Right perihilar scarring.  No acute cardiopulmonary disease.   Original Report Authenticated By: Hulan Saas, M.D.      1. Abdominal pain       MDM  On repeat exam.  No ttp epigastrum, or ruq now.  Negative murphy's.  Pt points more to the umbilicus.  She has had trouble with recurrent abdominal pain over the years.  Discussed further imaging with CT scan or abd [place].  Pt would prefer to follow up with her GI doctor.  At this time, doubt acute surgical emergency.          Celene Kras, MD 09/10/12 [phone]

## 2012-09-10 NOTE — ED Notes (Signed)
Per patient, has been having abdominal pain on and off for a week, history of ulcers-nausea

## 2012-09-17 ENCOUNTER — Ambulatory Visit
Admission: RE | Admit: 2012-09-17 | Discharge: 2012-09-17 | Disposition: A | Discharge status: Home / Self Care | Payer: Medicare Other | Source: Ambulatory Visit | Attending: Gastroenterology | Admitting: Gastroenterology

## 2012-09-17 ENCOUNTER — Other Ambulatory Visit: Payer: Medicare Other

## 2012-09-17 DIAGNOSIS — K279 Peptic ulcer, site unspecified, unspecified as acute or chronic, without hemorrhage or perforation: Secondary | ICD-10-CM

## 2012-09-22 ENCOUNTER — Other Ambulatory Visit: Payer: Self-pay | Admitting: Gastroenterology

## 2012-09-25 ENCOUNTER — Encounter (HOSPITAL_COMMUNITY): Payer: Self-pay

## 2012-09-25 ENCOUNTER — Encounter (HOSPITAL_COMMUNITY)
Admission: RE | Disposition: A | Discharge status: Home / Self Care | Payer: Self-pay | Source: Ambulatory Visit | Attending: Gastroenterology

## 2012-09-25 ENCOUNTER — Ambulatory Visit (HOSPITAL_COMMUNITY)
Admission: RE | Admit: 2012-09-25 | Discharge: 2012-09-25 | Disposition: A | Discharge status: Home / Self Care | Payer: Medicare Other | Source: Ambulatory Visit | Attending: Gastroenterology | Admitting: Gastroenterology

## 2012-09-25 DIAGNOSIS — J4489 Other specified chronic obstructive pulmonary disease: Secondary | ICD-10-CM | POA: Insufficient documentation

## 2012-09-25 DIAGNOSIS — K259 Gastric ulcer, unspecified as acute or chronic, without hemorrhage or perforation: Secondary | ICD-10-CM | POA: Insufficient documentation

## 2012-09-25 DIAGNOSIS — J449 Chronic obstructive pulmonary disease, unspecified: Secondary | ICD-10-CM | POA: Insufficient documentation

## 2012-09-25 DIAGNOSIS — Z9884 Bariatric surgery status: Secondary | ICD-10-CM | POA: Insufficient documentation

## 2012-09-25 DIAGNOSIS — K219 Gastro-esophageal reflux disease without esophagitis: Secondary | ICD-10-CM | POA: Insufficient documentation

## 2012-09-25 DIAGNOSIS — R109 Unspecified abdominal pain: Secondary | ICD-10-CM | POA: Insufficient documentation

## 2012-09-25 DIAGNOSIS — F17200 Nicotine dependence, unspecified, uncomplicated: Secondary | ICD-10-CM | POA: Insufficient documentation

## 2012-09-25 DIAGNOSIS — Z934 Other artificial openings of gastrointestinal tract status: Secondary | ICD-10-CM | POA: Insufficient documentation

## 2012-09-25 DIAGNOSIS — K3184 Gastroparesis: Secondary | ICD-10-CM | POA: Insufficient documentation

## 2012-09-25 HISTORY — PX: ESOPHAGOGASTRODUODENOSCOPY: SHX5428

## 2012-09-25 SURGERY — EGD (ESOPHAGOGASTRODUODENOSCOPY)
Anesthesia: Moderate Sedation

## 2012-09-25 MED ORDER — MIDAZOLAM HCL 10 MG/2ML IJ SOLN
INTRAMUSCULAR | Status: DC | PRN
  Administered 2012-09-25 (×4): 2 mg via INTRAVENOUS

## 2012-09-25 MED ORDER — BUTAMBEN-TETRACAINE-BENZOCAINE 2-2-14 % EX AERO
INHALATION_SPRAY | CUTANEOUS | Status: DC | PRN
  Administered 2012-09-25: 2 via TOPICAL

## 2012-09-25 MED ORDER — FENTANYL CITRATE 0.05 MG/ML IJ SOLN
INTRAMUSCULAR | Status: DC | PRN
  Administered 2012-09-25 (×4): 25 ug via INTRAVENOUS

## 2012-09-25 MED ORDER — DIPHENHYDRAMINE HCL 50 MG/ML IJ SOLN
INTRAMUSCULAR | Status: DC | PRN
  Administered 2012-09-25: 25 mg via INTRAVENOUS

## 2012-09-25 MED ORDER — SODIUM CHLORIDE 0.9 % IV SOLN
INTRAVENOUS | Status: DC
  Administered 2012-09-25: 500 mL via INTRAVENOUS

## 2012-09-25 MED ORDER — DIPHENHYDRAMINE HCL 50 MG/ML IJ SOLN
INTRAMUSCULAR | Status: AC
  Filled 2012-09-25: qty 1

## 2012-09-25 MED ORDER — MIDAZOLAM HCL 10 MG/2ML IJ SOLN
INTRAMUSCULAR | Status: AC
  Filled 2012-09-25: qty 2

## 2012-09-25 MED ORDER — FENTANYL CITRATE 0.05 MG/ML IJ SOLN
INTRAMUSCULAR | Status: AC
  Filled 2012-09-25: qty 2

## 2012-09-25 NOTE — H&P (Signed)
Reason for Consult: ABM pain and history of PUD, s/p gastrojejunostomy  [name] HPI: This is a 62 year old female with a complex gastrointestinal history.  She has a history of PUD and in the past she underwent surgical intervention.  The notes are not clear, but it appears that she had a gastrojejunostomy.  There also appears to be a component of gastroparesis.  Repeated EGDs were performed by various GI's in the past and it revealed a significant amount of retained gastric contents as well as a strictured gastrojejunostomy.  She comes in with complaints of abdominal pain and fullness again.  Additionally, she had a history of a duodenal ulcer with perforation.   Past Medical History  Diagnosis Date  . Peptic ulcer disease     dr Randa Evens  . Gastric outlet obstruction   . Cancer   . Acute duodenal ulcer with hemorrhage and perforation, with obstruction   . Barrett's esophagus   . Menopause   . GERD (gastroesophageal reflux disease)   . Asthma   . Chronic abdominal pain     narcotic dependence, dr gyarteng-dak at [facility]  . Narcotic abuse   . Anxiety     sees Saul Fordyce NP at Dr. Danella Sensing office  . Fx two ribs-open 08-24-11    left 4th and 5th  . Fx of fibula 07-28-11    left fibula, 2 places   . COPD (chronic obstructive pulmonary disease)     Past Surgical History  Procedure Laterality Date  . Esophagogastroduodenoscopy  12-29-09    dr qadeer at [facility], several gastric ulcers  . Vagotomy      lapaproscopic - [facility]an  . 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 Hernia repair    . Perforated ulcer    . Biopsy thyroid  08-13-11    benign nodule, per Dr. Christia Reading     Family History  Problem Relation Age of Onset  . Cancer Mother     kidney, magliant tumor on face  . Celiac disease Father     Social History:  reports that she has been smoking Cigarettes.  She has a 90 pack-year smoking history. She has never used smokeless tobacco. She reports  that she does not drink alcohol or use illicit drugs.  Allergies:  Allergies  Allergen Reactions  . Alprazolam     REACTION: unspecified  . Morphine     REACTION: itching  . Quetiapine     REACTION: tardive dyskinesia  . Varenicline Tartrate     REACTION: unspecified    Medications: Scheduled: Continuous:  No results found for this or any previous visit (from the past 24 hour(s)).   No results found.  ROS:  As stated above in the HPI otherwise negative.  There were no vitals taken for this visit.    PE: Gen: NAD, Alert and Oriented HEENT:  [place]/AT, EOMI Neck: Supple, no LAD Lungs: CTA Bilaterally CV: RRR without M/G/R ABM: Soft, NTND, +BS Ext: No C/C/E  Assessment/Plan: 1) ABM pain. 2) History of gastric surgery. 3) Gastroparesis.  Plan: 1) I will perform an EGD to further evaluate her situation.  She may require a dilation of the gastrojejunostomy if there is a stricture.  Since her anatomy is rather complex the EGD should help me to obtain a clearer understanding of the anatomy.  Pericles Carmicheal D 09/25/2012, 8:36 AM

## 2012-09-25 NOTE — Op Note (Signed)
[facility] [address] [place], [address]   OPERATIVE PROCEDURE REPORT  PATIENT: [name], [name]  MR#: [id number] BIRTHDATE: 20-Dec-1950  GENDER: Female ENDOSCOPIST: Jeani Hawking, MD ASSISTANT:   Felecia Shelling, RN and Blair Dolphin, technician PROCEDURE DATE: 09/25/2012 PROCEDURE:   EGD, diagnostic ASA CLASS:   Class III INDICATIONS:ABM pain. MEDICATIONS: Versed 8 mg IV, Fentanyl 100 mcg IV, and Benadryl 25 mg IV TOPICAL ANESTHETIC:   none  DESCRIPTION OF PROCEDURE:   After the risks benefits and alternatives of the procedure were thoroughly explained, informed consent was obtained.  The endoscope [id number]  endoscope was introduced through the mouth  and advanced to the proximal jejunum Without limitations.      The instrument was slowly withdrawn as the mucosa was fully examined.  FINDINGS: The distal esophagus had a coating and upon initial entry into the stomach a significant amount of retained food was identified.  In the distal gastric body along the lesser curvature the gastrojejunostomy was identified.  A superficial nonbleeding anastamotic ulcer was noted, but no evidence of stenosis.  The endoscope was able to be passed through the area with ease.  Along the lesser curvature and towards the pylorus a couple of large superficial ulcerations were found.  No stigmata of bleeding.  The pylorus was easily intubated and the duodenum was normal. Retroflexed views revealed retained gastric contents.     The scope was then withdrawn from the patient and the procedure terminated.  COMPLICATIONS: There were no complications. IMPRESSION: 1) Gastrojejunostomy ulcer. 2) Lesser curvature/antral/pyloric ulcers. 3) Gastroparesis.  RECOMMENDATIONS: 1) PPI BID. 2) Sucralfate 1 gram QID. 3) Follow up in one month. 4) Trial of Reglan.   _______________________________ Rosalie DoctorJeani Hawking, MD 09/25/2012 9:57 AM

## 2012-09-28 ENCOUNTER — Encounter (HOSPITAL_COMMUNITY): Payer: Self-pay | Admitting: Gastroenterology

## 2012-10-23 ENCOUNTER — Other Ambulatory Visit: Payer: Self-pay | Admitting: Family Medicine

## 2012-11-08 ENCOUNTER — Other Ambulatory Visit: Payer: Self-pay | Admitting: Family Medicine

## 2012-12-23 ENCOUNTER — Ambulatory Visit (INDEPENDENT_AMBULATORY_CARE_PROVIDER_SITE_OTHER): Payer: Medicare Other | Admitting: Family Medicine

## 2012-12-23 ENCOUNTER — Ambulatory Visit: Payer: Medicare Other

## 2012-12-23 VITALS — BP 122/68 | HR 88 | Temp 98.1°F | Resp 18 | Ht 64.0 in | Wt 115.0 lb

## 2012-12-23 DIAGNOSIS — R634 Abnormal weight loss: Secondary | ICD-10-CM

## 2012-12-23 DIAGNOSIS — K3184 Gastroparesis: Secondary | ICD-10-CM

## 2012-12-23 DIAGNOSIS — J441 Chronic obstructive pulmonary disease with (acute) exacerbation: Secondary | ICD-10-CM

## 2012-12-23 DIAGNOSIS — D649 Anemia, unspecified: Secondary | ICD-10-CM

## 2012-12-23 DIAGNOSIS — K279 Peptic ulcer, site unspecified, unspecified as acute or chronic, without hemorrhage or perforation: Secondary | ICD-10-CM

## 2012-12-23 LAB — POCT CBC
HCT, POC: 35.5 % — AB (ref 37.7–47.9)
Hemoglobin: 10.7 g/dL — AB (ref 12.2–16.2)
Lymph, poc: 2.4 (ref 0.6–3.4)
MCH, POC: 24.5 pg — AB (ref 27–31.2)
MCHC: 30.1 g/dL — AB (ref 31.8–35.4)
POC LYMPH PERCENT: 19.6 %L (ref 10–50)
POC MID %: 8 %M (ref 0–12)
RDW, POC: 21 %

## 2012-12-23 LAB — POCT UA - MICROSCOPIC ONLY
Bacteria, U Microscopic: NEGATIVE
Crystals, Ur, HPF, POC: NEGATIVE
RBC, urine, microscopic: NEGATIVE

## 2012-12-23 LAB — POCT URINALYSIS DIPSTICK
Blood, UA: NEGATIVE
Ketones, UA: NEGATIVE
Protein, UA: NEGATIVE
Spec Grav, UA: 1.015
pH, UA: 5

## 2012-12-23 NOTE — Patient Instructions (Addendum)
Pick a quit date and planned to stop smoking  Increase protein in diet  We will let you know the results of your additional labs in a few days  Return in one month for recheck
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
## 2012-12-24 LAB — COMPREHENSIVE METABOLIC PANEL
BUN: 16 mg/dL (ref 6–23)
CO2: 23 mEq/L (ref 19–32)
Creat: 1 mg/dL (ref 0.50–1.10)
Glucose, Bld: 90 mg/dL (ref 70–99)
Total Bilirubin: 0.3 mg/dL (ref 0.3–1.2)
Total Protein: 6.8 g/dL (ref 6.0–8.3)

## 2012-12-24 LAB — VITAMIN B12: Vitamin B-12: 757 pg/mL (ref 211–911)

## 2012-12-24 LAB — FERRITIN: Ferritin: 10 ng/mL (ref 10–291)

## 2012-12-24 LAB — FOLATE: Folate: >20 ng/mL

## 2012-12-27 ENCOUNTER — Encounter: Payer: Self-pay | Admitting: Family Medicine

## 2013-02-28 ENCOUNTER — Other Ambulatory Visit: Payer: Self-pay | Admitting: Family Medicine

## 2013-03-03 ENCOUNTER — Other Ambulatory Visit: Payer: Self-pay | Admitting: Family Medicine

## 2013-05-13 ENCOUNTER — Encounter (INDEPENDENT_AMBULATORY_CARE_PROVIDER_SITE_OTHER): Payer: Self-pay

## 2013-05-13 ENCOUNTER — Telehealth (INDEPENDENT_AMBULATORY_CARE_PROVIDER_SITE_OTHER): Payer: Self-pay | Admitting: *Deleted

## 2013-05-13 ENCOUNTER — Ambulatory Visit (INDEPENDENT_AMBULATORY_CARE_PROVIDER_SITE_OTHER): Payer: Medicare Other | Admitting: Surgery

## 2013-05-13 ENCOUNTER — Encounter (INDEPENDENT_AMBULATORY_CARE_PROVIDER_SITE_OTHER): Payer: Self-pay | Admitting: Surgery

## 2013-05-13 ENCOUNTER — Other Ambulatory Visit (INDEPENDENT_AMBULATORY_CARE_PROVIDER_SITE_OTHER): Payer: Self-pay

## 2013-05-13 VITALS — BP 122/62 | HR 74 | Temp 98.0°F | Resp 18 | Ht 64.0 in | Wt 105.6 lb

## 2013-05-13 DIAGNOSIS — K311 Adult hypertrophic pyloric stenosis: Secondary | ICD-10-CM

## 2013-05-13 DIAGNOSIS — Z9889 Other specified postprocedural states: Secondary | ICD-10-CM

## 2013-05-13 DIAGNOSIS — F17200 Nicotine dependence, unspecified, uncomplicated: Secondary | ICD-10-CM

## 2013-05-13 DIAGNOSIS — Z09 Encounter for follow-up examination after completed treatment for conditions other than malignant neoplasm: Secondary | ICD-10-CM

## 2013-05-13 DIAGNOSIS — K3184 Gastroparesis: Secondary | ICD-10-CM

## 2013-05-13 DIAGNOSIS — E440 Moderate protein-calorie malnutrition: Secondary | ICD-10-CM

## 2013-05-13 HISTORY — DX: Other specified postprocedural states: Z98.890

## 2013-05-13 MED ORDER — HYDROCODONE-ACETAMINOPHEN 5-325 MG PO TABS: 1.0000 | ORAL_TABLET | ORAL | Status: DC | PRN

## 2013-05-13 NOTE — Progress Notes (Signed)
[name] 62 y.o.  Body mass index is 18.12 kg/(m^2).  Patient Active Problem List   Diagnosis Date Noted  . Moderate malnutrition 05/13/2013  . COPD (chronic obstructive pulmonary disease) 11/07/2010  . Pneumonia, organism unspecified 11/07/2010  . DEPRESSION 06/06/2010  . LUNG NODULE 03/15/2010  . 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 FEVER, RECURRENT 08/11/2009  . WEIGHT LOSS 08/11/2009  . LEG EDEMA 01/19/2008  . GASTRIC OUTLET OBSTRUCTION 08/28/2007  . CIGARETTE SMOKER 08/24/2007  . GASTROPARESIS 06/03/2007  . PEPTIC ULCER DISEASE 04/30/2007  . SYMPTOM, PAIN, ABDOMINAL, GENERALIZED 03/16/2007  . ANXIETY 01/07/2007  . GERD 01/07/2007  . MENOPAUSE 01/07/2007  . BARRETT'S ESOPHAGUS, HX OF 07/03/2005  . ACUTE DUODEN ULCER W/HEMORR PERF&OBSTRUCTION 06/26/2004    Allergies  Allergen Reactions  . Alprazolam     REACTION: unspecified  . Celebrex [Celecoxib] Nausea Only  . Morphine     REACTION: itching  . Quetiapine     REACTION: tardive dyskinesia  . Varenicline Tartrate     REACTION: unspecified    Past Surgical History  Procedure Laterality Date  . Esophagogastroduodenoscopy  12-29-09    dr qadeer at [facility], several gastric ulcers  . Vagotomy      lapaproscopic - [facility]  . 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 Hernia repair    . Perforated ulcer    . Biopsy thyroid  08-13-11    benign nodule, per Dr. Christia Reading   . Esophagogastroduodenoscopy N/A 09/25/2012    Procedure: ESOPHAGOGASTRODUODENOSCOPY (EGD);  Surgeon: Theda Belfast, MD;  Location: [facility];  Service: Endoscopy;  Laterality: N/A;  . Joint replacement    . Eye surgery     Nelwyn Salisbury, MD 1. Moderate malnutrition     Liliya was seen at [facility] by Beverly Gust whose recommendations were forwarded to me and in short he felt that the best approach is a subtotal gastrectomy. At the present time that he has moderate malnutrition his weight is  around 100 pounds. She continues to smoke which is a lot of problem with ulceration. I thought that she had a good gastrojejunostomy. She is able to tolerate spaghetti and some ground meat. I think that she is failing for some other reasons most notably her smoking and probably just failing to take good care of herself. I think that she needs to have a nasogastric tube placed and she needs to be started on outpatient tube feedings. We will see about setting those up and give her about 4 weeks of enteral nutrition. Therefore able to build her up adequately then I think we might consider a subtotal gastrectomy. However the present time and her present state of poor nutrition I think such an operation would be very risky.  Plan radiology to insert nasogastric tube hopefully able to thread it into her small bowel outlet followed by palpation nutritional therapy. Matt B. Daphine Deutscher, MD, [facility] [phone] beeper [phone]  05/13/2013 10:20 AM

## 2013-05-13 NOTE — Telephone Encounter (Signed)
LMOM for pt to return my call.  I was calling to inform her of appt to have a nasogastric feeding tube placed at [facility] on 05/14/13 with an arrival time of 10:15am.

## 2013-05-13 NOTE — Patient Instructions (Signed)
Feeding Tube Use  Some people who have trouble swallowing or cannot take food or medicine by mouth may need a feeding tube. A feeding tube can go into the nose and down to the stomach or small bowel or through the skin in the abdomen and into the stomach or small bowel. Liquid food (formula), breast milk, or medicines may be given through the tube.   SUPPLIES NEEDED FOR A TUBE FEEDING    Clean gloves.   Prescribed formula or breast milk.   Appropriate feeding bag set, gravity drip tubing set, or 30 60-mL syringe with feeding extension tubing.   Feeding tube pump or syringe pump as needed.   Pole as needed.   20 60-mL syringe to check tube placement.   A syringe to flush the feeding tube.   Container.   Water.  GIVING A FEEDING THROUGH A FEEDING TUBE PUMP  1. Have all supplies ready and available.  2. Wash hands well.  3. Put on clean gloves.  4. Check the placement of the feeding tube as directed.  5. Elevate the head of the person 30 45 degrees or as directed.  6. Pour up to 4 hours of room-temperature feeding into the feeding bag set.  7. Hang the feeding bag set from a pole.  8. Flush the entire feeding bag set with the feeding.  9. Load the feeding bag set into the feeding tube pump.  10. Cap the feeding bag set.  11. Uncap the feeding tube.  12. Using a syringe, flush the feeding tube with water as directed.  13. Uncap the feeding bag set.  14. Connect the feeding bag set to the feeding tube.  15. Remove gloves.  16. Wash hands well.  17. Set the prescribed feeding rate.  18. Start the feeding tube pump.  GIVING A FEEDING THROUGH A SYRINGE PUMP   1. Have all supplies ready and available.  2. Wash hands well.  3. Put on clean gloves.  4. Check the placement of the feeding tube as directed.  5. Elevate the head of the person 30 45 degrees or as directed.  6. Draw up to 4 hours of room-temperature formula into the correctly sized syringe.  7. Attach the syringe to the feeding extension tubing.  8. Flush  the entire feeding extension tubing with the feeding.  9. Load the syringe and feeding extension tubing into the syringe pump.  10. Cap the feeding extension tubing.  11. Uncap the feeding tube.  12. Using a syringe, flush the feeding tube with water as directed.  13. Uncap the feeding extension tubing.  14. Connect the feeding extension tubing to the feeding tube.  15. Remove gloves.  16. Wash hands well.  17. Set the prescribed feeding rate.  18. Start the syringe pump.  GIVING A FEEDING BY GRAVITY   1. Have all supplies ready and available.  2. Wash hands well.  3. Put on clean gloves.  4. Check the placement of the feeding tube as directed.  5. Elevate the head of the person 30 45 degrees or as directed.  6. Close the clamp on the gravity drip tubing set.  7. Pour up to 4 hours of room-temperature feeding into the bag of a gravity drip tubing set. Or, if using a ready-to-hang formula container, connect the gravity drip tubing set to the ready-to-hang container.  8. Hang the feeding with the attached gravity drip tubing set from a pole.  9. Open the roller clamp on   the gravity drip tubing to fill the entire tubing with the feeding.  10. Close the roller clamp.  11. Cap the gravity drip tubing.  12. Uncap the feeding tube.  13. Using a syringe, flush the feeding tube with water as directed.  14. Uncap the gravity drip tubing.  15. Connect the gravity drip tubing to the feeding tube.  16. Using the roller clamp, adjust the feeding rate to deliver the feeding at the prescribed rate.  17. Remove gloves.  18. Wash hands well.  GIVING A BOLUS FEEDING THROUGH A FEEDING TUBE  Remove the plunger from the syringe. Attach the syringe to the feeding tube. Pour the feeding into the syringe and administer at the prescribed rate by means of gravity. A feeding bag may also be used for bolus feedings, depending on the volume of feeding given.   SUPPLIES NEEDED FOR GIVING MEDICINES THROUGH A FEEDING TUBE   60-mL  syringe.   Container.   Water.   Medicine.   Pill crusher if medicine is in tablet form.   Clean gloves.  GIVING MEDICINES THROUGH A FEEDING TUBE   1. Have all supplies ready and available.  2. Wash hands well.  3. If the medicine cannot be given with the feeding, stop the feeding 60 minutes before giving the medicine.  4. If the person needs to take medicine on an empty stomach, consider not giving a feeding for up to 2 hours (hold time) before giving medicine. Talk to the caregiver about appropriate hold times.  5. Fill a container with 50 100 mL of warm water.  6. Prepare medicine that will go into the feeding tube.   Liquid Measure the prescribed medicine dose.   Tablet Crush the tablet using a pill-crushing device. Grind the pill to a fine powder. Dissolve the powder in at least 30 mL of warm water or as directed. If there is more than 1 tablet, crush and dilute each one individually.   Capsule Open a capsule containing dry pellets or pierce a capsule containing liquid (gelcap). Empty the contents in 30 mL of warm water or as directed. Gelcaps can also be dissolved in warm water.  7. Elevate the head of the person 30 45 degrees or as directed.  8. Wash hands well.  9. Put on clean gloves.  10. If a continuous tube feeding is infusing, stop the tube feeding.  11. If applicable, close the tubing clamp.  12. Disconnect the feeding bag set or gravity drip tubing set from the feeding tube.  13. Cap the feeding bag set or gravity drip tubing set.  14. Check the placement of the feeding tube as directed.  15. Draw up 30 mL of water into a syringe or as directed.  16. Insert the tip of the syringe into the feeding tube.  17. Using the syringe, flush the feeding tube with water.  18. Remove the plunger of the syringe or replace the syringe with a syringe that contains medicine.  19. Give the medicine as directed.   If giving only 1 dose of medicine, flush the feeding tube with water after giving the  medicine.   If giving more than 1 dose of medicine, give each separately. Flush the feeding tube between medicines and after the last dose of medicine.  20. If a tube feeding will not be continued after the medicine, cap the feeding tube. Position the person to a comfortable position, but keep his or her head elevated for 1 hour after   giving the medicine.  21. If a tube feeding will be continued after the medicine, but the medicine cannot be given with the feeding, continue to hold the feeding for 30 60 minutes after the medicine is given. When appropriate, reconnect the feeding bag set or gravity drip tubing set to the feeding tube.  22. Throw away or clean used supplies as directed.  23. Remove gloves.  24. Wash hands well.  Document Released: 05/27/2012 Document Reviewed: 05/27/2012  [employer] Patient Information 2014 [employer].

## 2013-05-14 ENCOUNTER — Telehealth (INDEPENDENT_AMBULATORY_CARE_PROVIDER_SITE_OTHER): Payer: Self-pay

## 2013-05-14 ENCOUNTER — Ambulatory Visit (HOSPITAL_COMMUNITY): Admission: RE | Admit: 2013-05-14 | Payer: Medicare Other | Source: Ambulatory Visit

## 2013-05-14 NOTE — Telephone Encounter (Signed)
The pt called and would like a call back.  She wants to know if there is anything else that can be done besides the feeding tube in her nose.  Please call.

## 2013-05-17 ENCOUNTER — Other Ambulatory Visit (INDEPENDENT_AMBULATORY_CARE_PROVIDER_SITE_OTHER): Payer: Self-pay

## 2013-05-17 NOTE — Telephone Encounter (Signed)
I rescheduled pts appt below to 05/19/13 with an arrival time of 10:30am at [facility].  I have made pt aware.

## 2013-05-19 ENCOUNTER — Telehealth (INDEPENDENT_AMBULATORY_CARE_PROVIDER_SITE_OTHER): Payer: Self-pay

## 2013-05-19 ENCOUNTER — Other Ambulatory Visit (INDEPENDENT_AMBULATORY_CARE_PROVIDER_SITE_OTHER): Payer: Self-pay

## 2013-05-19 ENCOUNTER — Ambulatory Visit (HOSPITAL_COMMUNITY)
Admission: RE | Admit: 2013-05-19 | Discharge: 2013-05-19 | Disposition: A | Discharge status: Home / Self Care | Payer: Medicare Other | Source: Ambulatory Visit | Attending: Surgery | Admitting: Surgery

## 2013-05-19 DIAGNOSIS — Z931 Gastrostomy status: Secondary | ICD-10-CM

## 2013-05-19 DIAGNOSIS — K3184 Gastroparesis: Secondary | ICD-10-CM

## 2013-05-19 NOTE — Telephone Encounter (Signed)
[facility]/ [facility] Lyons called concerning panda feeding tube  Placement, Patient is stating she was not taught what/when/how to do her feedings. Spoke to [employer] CMA she was not aware patient was not educated on [place] feeding tube flushes or feedings. Maureen Ralphs we would set up [facility] to educate. After speaking with Baxter Hire RN ,Patient was cancelled until next week for placement and education to be provided due to [facility] not available   for teaching until after the holidays .

## 2013-05-21 ENCOUNTER — Telehealth (INDEPENDENT_AMBULATORY_CARE_PROVIDER_SITE_OTHER): Payer: Self-pay | Admitting: Surgery

## 2013-05-21 NOTE — Telephone Encounter (Signed)
Patient apparently had NG tube placed for tube feedings.  [facility] to go to home and begin tube feedings.  Unsure of type of tube and feedings desired by MD.  I told them to begin small volume bolus feeds and consider the tube to be positioned in the stomach.  Will ask Dr. Daphine Deutscher and [facility] office to follow up on Monday.  Velora Heckler, MD, [facility] Office: [phone]

## 2013-05-24 ENCOUNTER — Telehealth (INDEPENDENT_AMBULATORY_CARE_PROVIDER_SITE_OTHER): Payer: Self-pay

## 2013-05-24 ENCOUNTER — Other Ambulatory Visit (INDEPENDENT_AMBULATORY_CARE_PROVIDER_SITE_OTHER): Payer: Self-pay | Admitting: *Deleted

## 2013-05-24 NOTE — Telephone Encounter (Signed)
I called and spoke to patient regarding her feeding tube being inserted tomorrow and getting home health to meet patient at hospital for instructions.  Patient and I spoke about the benefits of this tube being placed.  Patient is agreeable with having feeding tube tomorrow as long as she can get some instructions on how to manage the tube and her feedings as well.  Stevie RN is sending a message to [place] with [facility] at this time to get that setup.  Patient is completely agreeable at this time for this procedure.

## 2013-05-24 NOTE — Telephone Encounter (Signed)
Patient calling in requesting more information on G Tube Feedings.  Patient transferred to Referral Coordinator to discuss Home Health.

## 2013-05-24 NOTE — Telephone Encounter (Signed)
Pt is scheduled to have feeding tube placed tomorrow (05/25/13).  However, we have set up home health to come out today to educate pt on feedings.  Dr. Daphine Deutscher would like her to receive Jevity.  Pt has been putting off having tube placed as she does not wish to have tube at all.  Pt is considered self pay [facility].  We are working on trying to get her with [place].

## 2013-05-25 ENCOUNTER — Other Ambulatory Visit (INDEPENDENT_AMBULATORY_CARE_PROVIDER_SITE_OTHER): Payer: Self-pay | Admitting: Surgery

## 2013-05-25 ENCOUNTER — Ambulatory Visit (HOSPITAL_COMMUNITY)
Admission: RE | Admit: 2013-05-25 | Discharge: 2013-05-25 | Disposition: A | Discharge status: Home / Self Care | Payer: Medicare Other | Source: Ambulatory Visit | Attending: Surgery | Admitting: Surgery

## 2013-05-25 ENCOUNTER — Telehealth (INDEPENDENT_AMBULATORY_CARE_PROVIDER_SITE_OTHER): Payer: Self-pay | Admitting: *Deleted

## 2013-05-25 ENCOUNTER — Other Ambulatory Visit (HOSPITAL_COMMUNITY): Payer: Medicare Other

## 2013-05-25 ENCOUNTER — Other Ambulatory Visit (INDEPENDENT_AMBULATORY_CARE_PROVIDER_SITE_OTHER): Payer: Self-pay | Admitting: *Deleted

## 2013-05-25 DIAGNOSIS — E440 Moderate protein-calorie malnutrition: Secondary | ICD-10-CM

## 2013-05-25 DIAGNOSIS — K3184 Gastroparesis: Secondary | ICD-10-CM

## 2013-05-25 DIAGNOSIS — Z4682 Encounter for fitting and adjustment of non-vascular catheter: Secondary | ICD-10-CM | POA: Insufficient documentation

## 2013-05-25 MED ORDER — DIAZEPAM 5 MG PO TABS
ORAL_TABLET | ORAL | Status: AC
  Filled 2013-05-25: qty 1

## 2013-05-25 MED ORDER — IOHEXOL 300 MG/ML  SOLN
50.0000 mL | Freq: Once | INTRAMUSCULAR | Status: AC | PRN
  Administered 2013-05-25: 50 mL

## 2013-05-25 MED ORDER — DIAZEPAM 5 MG PO TABS
5.0000 mg | ORAL_TABLET | Freq: Once | ORAL | Status: AC
  Administered 2013-05-25: 5 mg via ORAL
  Filled 2013-05-25: qty 1

## 2013-05-25 NOTE — Telephone Encounter (Signed)
Received a call from Angie in Radiology that patient is too agitated to continue.  I spoke to Dr. Daphine Deutscher who gave order for Valium 5mg  then repeat once if necessary so patient can tolerate procedure.  Belenda Cruise RN with Radiology given this verbal order.  She will assess BP prior to giving then let us know if it is not appropriate.

## 2013-05-25 NOTE — Telephone Encounter (Signed)
Per [place] Tamela Oddi) [place]:  Just wanted to give you an update. Dietician has viewed her case and spoken with her. This is what we will do if you would like to copy and paste somewhere in patients chart:   Enteral orders as per consult Dr. Luretha Murphy:  Goal is bolus feedings of Osmolite 1.5, 4 cans per day given every 3 hrs. Water flushes of 60ml before and after each feeding.  Start with Osmolite 1.5 4 times per day with above water flushes. Increase by 120-259ml of formula each day as tolerated to the goal rate.    Pt will need to sign an WOL (due to non permanent tube and short term use) and be private pay. I have discussed this with the pt. She will not need gauze.

## 2013-05-26 ENCOUNTER — Encounter: Payer: Medicare Other | Admitting: Family Medicine

## 2013-05-26 DIAGNOSIS — Z0289 Encounter for other administrative examinations: Secondary | ICD-10-CM

## 2013-05-27 ENCOUNTER — Telehealth (INDEPENDENT_AMBULATORY_CARE_PROVIDER_SITE_OTHER): Payer: Self-pay | Admitting: General Surgery

## 2013-05-27 ENCOUNTER — Other Ambulatory Visit (INDEPENDENT_AMBULATORY_CARE_PROVIDER_SITE_OTHER): Payer: Self-pay | Admitting: *Deleted

## 2013-05-27 DIAGNOSIS — K311 Adult hypertrophic pyloric stenosis: Secondary | ICD-10-CM

## 2013-05-27 MED ORDER — HYDROCODONE-ACETAMINOPHEN 5-325 MG PO TABS: 1.0000 | ORAL_TABLET | ORAL | Status: DC | PRN

## 2013-05-27 NOTE — Telephone Encounter (Signed)
Spoke to Dr. Daphine Deutscher who approved patient for a refill of Norco 5/325mg  Take 1 tablet every 4 hours as needed for pain #30 no refills.  Prescription written out just waiting on urgent office MD to arrive to sign prescription then will update patient.

## 2013-05-27 NOTE — Telephone Encounter (Signed)
Dr. Ezzard Standing signed prescription in office.  Patient updated at this time that prescription is available for pick up and states understanding.

## 2013-05-27 NOTE — Telephone Encounter (Signed)
Zella Ball, nurse with [facility], called to report this pt was seen today and has "quite a bit of bruising and swelling" on her face from difficulty placing her NG tube for feedings.  Pt reported to nurse she had taken both tramadol and tylenol without relief.  Requesting Norco instead.  She was given #50 at her last OV on 05/13/13, but they are all gone now.  Will contact Dr. Daphine Deutscher.

## 2013-05-31 ENCOUNTER — Other Ambulatory Visit (INDEPENDENT_AMBULATORY_CARE_PROVIDER_SITE_OTHER): Payer: Self-pay | Admitting: *Deleted

## 2013-05-31 ENCOUNTER — Telehealth (INDEPENDENT_AMBULATORY_CARE_PROVIDER_SITE_OTHER): Payer: Self-pay | Admitting: *Deleted

## 2013-05-31 DIAGNOSIS — K311 Adult hypertrophic pyloric stenosis: Secondary | ICD-10-CM

## 2013-05-31 MED ORDER — HYDROCODONE-ACETAMINOPHEN 5-325 MG PO TABS: 1.0000 | ORAL_TABLET | ORAL | Status: DC | PRN

## 2013-05-31 NOTE — Telephone Encounter (Signed)
Spoke to Dr. Dwain Sarna since urgent office MD has not arrived yet and he approved patient for Norco #10.  Prescription written and placed at front desk for patient pick up.  Left a message to call triage to be updated regarding prescription ready for pick up.

## 2013-05-31 NOTE — Telephone Encounter (Signed)
Patient called to ask for a refill of pain medication.  Patient states facial pain and nasal pain from feeding tube.  Text message sent to Dr. Daphine Deutscher to ask for approval.  Awaiting response at this time.  Patient aware and agreeable with plan at this time.  Explained we will let her know as soon as we hear back from Dr. Daphine Deutscher.

## 2013-05-31 NOTE — Telephone Encounter (Signed)
Dr. Daphine Deutscher approved refill of Norco.  Awaiting urgent office MD, Dr. Lindie Spruce, to arrive to sign prescription then we will update the patient.

## 2013-06-02 ENCOUNTER — Encounter (INDEPENDENT_AMBULATORY_CARE_PROVIDER_SITE_OTHER): Payer: Medicare Other | Admitting: Surgery

## 2013-06-02 ENCOUNTER — Telehealth (INDEPENDENT_AMBULATORY_CARE_PROVIDER_SITE_OTHER): Payer: Self-pay

## 2013-06-02 NOTE — Telephone Encounter (Signed)
Called and spoke to patient regarding message rec'd from front desk.  Called and offered appointment to see Dr. Daphine Deutscher today @ 4:30 pm but, patient states she's not able to come in that late in the day.  Patient r/s to come in on Thursday 06/03/13 @ 9:10 am w/Dr. Daphine Deutscher.  Home health nurse was at the door and patient said she would call back if she has any concerns or questions.  Otherwise, we will see her tomorrow morning @ 9:10.

## 2013-06-02 NOTE — Telephone Encounter (Signed)
Message copied by Maryan Puls on Wed Jun 02, 2013 11:10 AM ------      Message from: Rise Paganini      Created: Wed Jun 02, 2013  9:17 AM      Regarding: Bennett Scrape: [phone]       Patient has a feeding tube and would like to get some medicine. The [facility] nurse is coming in at 11 am today. She is asking to be worked into his schedule. She stated that she is miserable and wants to see if Dr. Daphine Deutscher would work her into his schedule. Thank you. ------

## 2013-06-03 ENCOUNTER — Encounter (INDEPENDENT_AMBULATORY_CARE_PROVIDER_SITE_OTHER): Payer: Medicare Other | Admitting: Surgery

## 2013-06-06 ENCOUNTER — Encounter (INDEPENDENT_AMBULATORY_CARE_PROVIDER_SITE_OTHER): Payer: Self-pay | Admitting: General Surgery

## 2013-06-06 NOTE — Progress Notes (Unsigned)
Patient ID: [name], female   DOB: 28-Nov-1950, 62 y.o.   MRN: [id number] Tomma Lightning and [facility] nurse called about [name].  She stated that her nasoenteric feeding tube was clogged.  [name] is able to tolerate some liquid by mouth.  I told Tomma Lightning to have her drink some Boost and Ensure today.  She will likely need the tube replaced in radiology tomorrow.  Will have office call her tomorrow and arrange for this.

## 2013-06-07 ENCOUNTER — Telehealth (INDEPENDENT_AMBULATORY_CARE_PROVIDER_SITE_OTHER): Payer: Self-pay

## 2013-06-07 ENCOUNTER — Emergency Department (HOSPITAL_COMMUNITY)
Admission: EM | Admit: 2013-06-07 | Discharge: 2013-06-07 | Disposition: A | Discharge status: Home / Self Care | Payer: Medicare Other | Attending: Emergency Medicine | Admitting: Emergency Medicine

## 2013-06-07 ENCOUNTER — Encounter (HOSPITAL_COMMUNITY): Payer: Self-pay | Admitting: Emergency Medicine

## 2013-06-07 DIAGNOSIS — K9423 Gastrostomy malfunction: Secondary | ICD-10-CM | POA: Insufficient documentation

## 2013-06-07 DIAGNOSIS — T85598A Other mechanical complication of other gastrointestinal prosthetic devices, implants and grafts, initial encounter: Secondary | ICD-10-CM

## 2013-06-07 DIAGNOSIS — J4489 Other specified chronic obstructive pulmonary disease: Secondary | ICD-10-CM | POA: Insufficient documentation

## 2013-06-07 DIAGNOSIS — J449 Chronic obstructive pulmonary disease, unspecified: Secondary | ICD-10-CM | POA: Insufficient documentation

## 2013-06-07 DIAGNOSIS — Y833 Surgical operation with formation of external stoma as the cause of abnormal reaction of the patient, or of later complication, without mention of misadventure at the time of the procedure: Secondary | ICD-10-CM | POA: Insufficient documentation

## 2013-06-07 DIAGNOSIS — Z8742 Personal history of other diseases of the female genital tract: Secondary | ICD-10-CM | POA: Insufficient documentation

## 2013-06-07 DIAGNOSIS — R509 Fever, unspecified: Secondary | ICD-10-CM | POA: Insufficient documentation

## 2013-06-07 DIAGNOSIS — D649 Anemia, unspecified: Secondary | ICD-10-CM | POA: Insufficient documentation

## 2013-06-07 DIAGNOSIS — F411 Generalized anxiety disorder: Secondary | ICD-10-CM | POA: Insufficient documentation

## 2013-06-07 DIAGNOSIS — K219 Gastro-esophageal reflux disease without esophagitis: Secondary | ICD-10-CM | POA: Insufficient documentation

## 2013-06-07 DIAGNOSIS — F17200 Nicotine dependence, unspecified, uncomplicated: Secondary | ICD-10-CM | POA: Insufficient documentation

## 2013-06-07 DIAGNOSIS — Z79899 Other long term (current) drug therapy: Secondary | ICD-10-CM | POA: Insufficient documentation

## 2013-06-07 DIAGNOSIS — Z8711 Personal history of peptic ulcer disease: Secondary | ICD-10-CM | POA: Insufficient documentation

## 2013-06-07 DIAGNOSIS — G8929 Other chronic pain: Secondary | ICD-10-CM | POA: Insufficient documentation

## 2013-06-07 DIAGNOSIS — Z8781 Personal history of (healed) traumatic fracture: Secondary | ICD-10-CM | POA: Insufficient documentation

## 2013-06-07 MED ORDER — HYDROCODONE-ACETAMINOPHEN 5-325 MG PO TABS: 1.0000 | ORAL_TABLET | ORAL | Status: DC | PRN

## 2013-06-07 NOTE — ED Provider Notes (Signed)
Medical screening examination/treatment/procedure(s) were conducted as a shared visit with non-physician practitioner(s) and myself.  I personally evaluated the patient during the encounter.  EKG Interpretation   None       Patient with clogged feeding tube, otherwise stable. Patient understands that if the feeding tube is taken out and not replaced she may not gain enough weight to be appropriate for surgery. Patient requesting for that to be taken out and not replaced. I spoke with Dr. Daphine Deutscher who states that is okay to take out the tube he will follow up as an outpatient.  Audree Camel, MD 06/07/13 [phone]

## 2013-06-07 NOTE — ED Provider Notes (Signed)
CSN: [id number]     Arrival date & time 06/07/13  1357 History   First MD Initiated Contact with Patient 06/07/13 1556     Chief Complaint  Patient presents with  . Feeding Tube problem    (Consider location/radiation/quality/duration/timing/severity/associated sxs/prior Treatment) HPI Pt is a 62yo female with hx of peptic ulcer disease, gastric outlet obstruction, Barrett's esophagus c/o clogged NG tube.  NG tube was placed last Wednesday, 12/10 at [facility] in the radiology department. Pt states it was uncomfortable but was able to push fluids after placement. Two days ago she noticed she could not push fluids anymore and tube appeared clotted. Yesterday, home health nurse attempted to flush NG tube but was unable.  Pt does have f/u appointment Friday, 12/19 with Dr. Daphine Deutscher for recheck and tube replacement but pt states she does not want to wait any longer. Reports low grade fever of 100F but no nausea or vomiting. Denies pain at this time.    Past Medical History  Diagnosis Date  . Peptic ulcer disease     dr Randa Evens  . Gastric outlet obstruction   . Acute duodenal ulcer with hemorrhage and perforation, with obstruction   . Barrett's esophagus   . Menopause   . GERD (gastroesophageal reflux disease)   . Asthma   . Chronic abdominal pain     narcotic dependence, dr gyarteng-dak at [facility]  . Narcotic abuse   . Anxiety     sees Saul Fordyce NP at Dr. Danella Sensing office  . Fx two ribs-open 08-24-11    left 4th and 5th  . Fx of fibula 07-28-11    left fibula, 2 places   . COPD (chronic obstructive pulmonary disease)   . S/P ORIF (open reduction internal fixation) fracture     both hips pinned  . Allergy   . Anemia    Past Surgical History  Procedure Laterality Date  . Esophagogastroduodenoscopy  12-29-09    dr qadeer at [facility], several gastric ulcers  . Vagotomy      lapaproscopic - [facility]  . 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 Hernia repair    .  Perforated ulcer    . Biopsy thyroid  08-13-11    benign nodule, per Dr. Christia Reading   . Esophagogastroduodenoscopy N/A 09/25/2012    Procedure: ESOPHAGOGASTRODUODENOSCOPY (EGD);  Surgeon: Theda Belfast, MD;  Location: [facility];  Service: Endoscopy;  Laterality: N/A;  . Joint replacement    . Eye surgery     Family History  Problem Relation Age of Onset  . Cancer Mother     kidney, magliant tumor on face  . Celiac disease Father    History  Substance Use Topics  . Smoking status: Current Every Day Smoker -- 2.00 packs/day for 45 years    Types: Cigarettes  . Smokeless tobacco: Never Used  . Alcohol Use: No   OB History   Grav Para Term Preterm Abortions TAB SAB Ect Mult Living                 Review of Systems  Constitutional: Positive for fever ( "low grade, like 100F"). Negative for chills.  HENT:       Nasal discomfort  Respiratory: Negative for shortness of breath.   Gastrointestinal: Negative for nausea, vomiting and abdominal pain.  All other systems reviewed and are negative.    Allergies  Alprazolam; Celebrex; Morphine; Quetiapine; and Varenicline tartrate  Home Medications  Current Outpatient Rx  Name  Route  Sig  Dispense  Refill  . ferrous sulfate 325 (65 FE) MG tablet   Oral   Take 325 mg by mouth 2 (two) times daily with a meal.         . FLUoxetine (PROZAC) 40 MG capsule      TAKE 2 CAPSULES BY MOUTH EVERY DAY   180 capsule   1   . FLUoxetine (PROZAC) 40 MG capsule   Oral   Take 40 mg by mouth 2 (two) times daily.         . Multiple Vitamin (MULTIVITAMIN WITH MINERALS) TABS tablet   Oral   Take 1 tablet by mouth daily.         Marland Kitchen omeprazole (PRILOSEC) 40 MG capsule   Oral   Take 1 capsule (40 mg total) by mouth 2 (two) times daily.   180 capsule   3   . promethazine (PHENERGAN) 25 MG suppository   Rectal   Place 25 mg rectally every 6 (six) hours as needed for nausea or vomiting.         Marland Kitchen HYDROcodone-acetaminophen  (NORCO/VICODIN) 5-325 MG per tablet   Oral   Take 1-2 tablets by mouth every 4 (four) hours as needed for moderate pain.   6 tablet   0    BP 92/59  Pulse 71  Temp(Src) 97.8 F (36.6 C) (Oral)  Resp 19  SpO2 100% Physical Exam  Nursing note and vitals reviewed. Constitutional: She appears well-developed and well-nourished. No distress.  Thin female, sitting comfortably on exam bed, NAD.  NG tube in place in right nostril.  HENT:  Head: Normocephalic and atraumatic.  Eyes: Conjunctivae are normal. No scleral icterus.  Neck: Normal range of motion.  Cardiovascular: Normal rate, regular rhythm and normal heart sounds.   Pulmonary/Chest: Effort normal and breath sounds normal. No respiratory distress. She has no wheezes. She has no rales. She exhibits no tenderness.  No respiratory distress, able to speak in full sentences w/o difficulty. Lungs: CTAB  Abdominal: Soft. Bowel sounds are normal. She exhibits no distension and no mass. There is no tenderness. There is no rebound and no guarding.  Soft, non-distended, non-tender.  Musculoskeletal: Normal range of motion.  Neurological: She is alert.  Skin: Skin is warm and dry. She is not diaphoretic.    ED Course  Procedures (including critical care time) Labs Review Labs Reviewed - No data to display Imaging Review No results found.  EKG Interpretation   None       MDM   1. Feeding tube blocked, initial encounter    Pt requesting feeding tube be removed due to discomfort and states tube is clogged. Home RN unable to unclog.  Pt appears well, NAD. Reports some discomfort at NG site of right nostril, no other complaints.  Dr. Criss Alvine spoke with Dr. Daphine Deutscher who stated NG tube can try to be flushed in ED, if unsuccessful, may remove, otherwise f/u as scheduled on Friday, 12/19  NG tube unable to be flushed in ED, removed in ED.  Pt states her nose still hurts, requesting pain medication.  Discussed giving small quantity of  pain medication. Rx: norco (6 tabs), advised to f/u as scheduled with Dr. Daphine Deutscher, general surgery, on Friday, 12/19. Return precautions provided. Pt verbalized understanding and agreement with tx plan.  Junius Finner, PA-C 06/07/13 1946

## 2013-06-07 NOTE — Telephone Encounter (Signed)
Spoke with patient regarding NG Tube being replaced.  Patient reports that it's clogged and the home health nurse Tomma Lightning) spoke to Dr. Abbey Chatters last night Home health tried to unclog the NG Tube but was unsuccessful.  Per Dr. Daphine Deutscher patient need's to have tube replaced.  Advised the patient that per Dr. Daphine Deutscher her NG Tube need's to be replaced.  Patient refuses to go to Radiology and have NG Tube replaced.  Patient sates she wants it out "NOW".  Advised patient that removing the NG Tube without Dr. Ermalene Searing order would be against medical advice.  Tried explaining to patient the need for the NG Tube but, patient refused to listen to reasons why she needs NG Tube in place at this time.  Advised patient that I felt it's best that she talk to Dr. Daphine Deutscher herself and they can discuss her NG Tube in further detail.  Patient scheduled appointment for Friday 06/11/13 @ 3:10 pm w/Dr. Daphine Deutscher.

## 2013-06-07 NOTE — ED Notes (Signed)
Pt c/o feeding tube being clogged x 2 days.  Pt sts tube was placed on 12/2.  Pt sts "I called my doctor, but I was told that he is in surgery all day and they didn't offer for anyone else to see me."

## 2013-06-07 NOTE — Telephone Encounter (Signed)
Patient calling into office this morning regarding her feeding tube being clogged.  Patient advised that we would need to schedule appointment in Radiology to have a new NG tube placed.  Patient reports that she does not want her NG Tube replaced, she wants it removed.  Explained to patient that the NG Tube is in place for feeding and nutrients to help increase her weight due to being so underweight and the health concerns that are associated with this.  Patient became very adamant that we have her NG Tube removed.  Patient advised that I will page Dr. Daphine Deutscher to discuss and I will call her back to discuss in further detail.

## 2013-06-11 ENCOUNTER — Encounter (INDEPENDENT_AMBULATORY_CARE_PROVIDER_SITE_OTHER): Payer: Self-pay | Admitting: Surgery

## 2013-06-11 ENCOUNTER — Ambulatory Visit (INDEPENDENT_AMBULATORY_CARE_PROVIDER_SITE_OTHER): Payer: Medicare Other | Admitting: Surgery

## 2013-06-11 ENCOUNTER — Encounter (INDEPENDENT_AMBULATORY_CARE_PROVIDER_SITE_OTHER): Payer: Medicare Other | Admitting: Surgery

## 2013-06-11 VITALS — BP 106/66 | HR 60 | Temp 97.1°F | Resp 18 | Ht 63.0 in | Wt 105.4 lb

## 2013-06-11 DIAGNOSIS — K3184 Gastroparesis: Secondary | ICD-10-CM

## 2013-06-11 MED ORDER — HYDROCODONE-ACETAMINOPHEN 10-325 MG/15ML PO SOLN: 15.0000 mL | ORAL | Status: DC | PRN

## 2013-06-11 NOTE — Progress Notes (Addendum)
Chief Complaint:  A chronic gastric outlet obstruction  History of Present Illness:  [name] is an 62 y.o. female who failed to be able to tolerate a feeding tube. This occluded and was removed. She has seen Dr. Cook at [facility] who recommended a subtotal gastrectomy. I reviewed her most recent upper GI series and think that we might be able to do a laparoscopic gastrectomy and revise her Billroth II anastomosis. I've explained this to her. She does have increased risk because she continues to smoke.  Past Medical History  Diagnosis Date  . Peptic ulcer disease     dr edwards  . Gastric outlet obstruction   . Acute duodenal ulcer with hemorrhage and perforation, with obstruction   . Barrett's esophagus   . Menopause   . GERD (gastroesophageal reflux disease)   . Asthma   . Chronic abdominal pain     narcotic dependence, dr gyarteng-dak at [facility]  . Narcotic abuse   . Anxiety     sees Kelly Virgil NP at Dr. Hejazi's office  . Fx two ribs-open 08-24-11    left 4th and 5th  . Fx of fibula 07-28-11    left fibula, 2 places   . COPD (chronic obstructive pulmonary disease)   . S/P ORIF (open reduction internal fixation) fracture     both hips pinned  . Allergy   . Anemia     Past Surgical History  Procedure Laterality Date  . Esophagogastroduodenoscopy  12-29-09    dr qadeer at [facility], several gastric ulcers  . Vagotomy      lapaproscopic - [facility]  . 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 Hernia repair    . Perforated ulcer    . Biopsy thyroid  08-13-11    benign nodule, per Dr. Dwight Bates   . Esophagogastroduodenoscopy N/A 09/25/2012    Procedure: ESOPHAGOGASTRODUODENOSCOPY (EGD);  Surgeon: Patrick D Hung, MD;  Location: [facility];  Service: Endoscopy;  Laterality: N/A;  . Joint replacement    . Eye surgery      Current Outpatient Prescriptions  Medication Sig Dispense Refill  . ferrous sulfate 325 (65 FE) MG tablet Take 325 mg by mouth 2  (two) times daily with a meal.      . FLUoxetine (PROZAC) 40 MG capsule TAKE 2 CAPSULES BY MOUTH EVERY DAY  180 capsule  1  . FLUoxetine (PROZAC) 40 MG capsule Take 40 mg by mouth 2 (two) times daily.      . HYDROcodone-acetaminophen (NORCO/VICODIN) 5-325 MG per tablet Take 1-2 tablets by mouth every 4 (four) hours as needed for moderate pain.  6 tablet  0  . Multiple Vitamin (MULTIVITAMIN WITH MINERALS) TABS tablet Take 1 tablet by mouth daily.      . omeprazole (PRILOSEC) 40 MG capsule Take 1 capsule (40 mg total) by mouth 2 (two) times daily.  180 capsule  3  . promethazine (PHENERGAN) 25 MG suppository Place 25 mg rectally every 6 (six) hours as needed for nausea or vomiting.       No current facility-administered medications for this visit.   Alprazolam; Celebrex; Morphine; Quetiapine; and Varenicline tartrate Family History  Problem Relation Age of Onset  . Cancer Mother     kidney, magliant tumor on face  . Celiac disease Father    Social History:   reports that she has been smoking Cigarettes.  She has a 90 pack-year smoking history. She has never used smokeless   tobacco. She reports that she does not drink alcohol or use illicit drugs.   REVIEW OF SYSTEMS - PERTINENT POSITIVES ONLY: Not contributory to the present illness.  Physical Exam:   Blood pressure 106/66, pulse 60, temperature 97.1 F (36.2 C), temperature source Temporal, resp. rate 18, height 5' 3" (1.6 m), weight 105 lb 6.4 oz (47.809 kg). Body mass index is 18.68 kg/(m^2).  Gen:  WDWN white female NAD  Neurological: Alert and oriented to person, place, and time. Motor and sensory function is grossly intact  Head: Normocephalic and atraumatic.  Eyes: Conjunctivae are normal. Pupils are equal, round, and reactive to light. No scleral icterus.  Neck: Normal range of motion. Neck supple. No tracheal deviation or thyromegaly present.  Cardiovascular:  SR without murmurs or gallops.  No carotid bruits Respiratory:  Effort normal.  No respiratory distress. No chest wall tenderness. Breath sounds normal.  No wheezes, rales or rhonchi.  Abdomen:  Soft flat no bowel hernias GU: Musculoskeletal: Normal range of motion. Extremities are nontender. No cyanosis, edema or clubbing noted Lymphadenopathy: No cervical, preauricular, postauricular or axillary adenopathy is present Skin: Skin is warm and dry. No rash noted. No diaphoresis. No erythema. No pallor. Pscyh: Normal mood and affect. Behavior is normal. Judgment and thought content normal.   LABORATORY RESULTS: No results found for this or any previous visit (from the past 48 hour(s)).  RADIOLOGY RESULTS: No results found.  Problem List: Patient Active Problem List   Diagnosis Date Noted  . Moderate malnutrition 05/13/2013  . Lap Nissen + truncal vagotomy July 2008 05/13/2013  . COPD (chronic obstructive pulmonary disease) 11/07/2010  . Pneumonia, organism unspecified 11/07/2010  . DEPRESSION 06/06/2010  . LUNG NODULE 03/15/2010  . 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 FEVER, RECURRENT 08/11/2009  . WEIGHT LOSS 08/11/2009  . LEG EDEMA 01/19/2008  . GASTRIC OUTLET OBSTRUCTION 08/28/2007  . CIGARETTE SMOKER 08/24/2007  . GASTROPARESIS 06/03/2007  . PEPTIC ULCER DISEASE 04/30/2007  . SYMPTOM, PAIN, ABDOMINAL, GENERALIZED 03/16/2007  . ANXIETY 01/07/2007  . MENOPAUSE 01/07/2007  . BARRETT'S ESOPHAGUS, HX OF 07/03/2005  . ACUTE DUODEN ULCER W/HEMORR PERF&OBSTRUCTION 06/26/2004    Assessment & Plan: Chronic gastric outlet obstruction after prior vagotomy, Nissen fundoplication, B2 gastrojejunostomy. Now with gastric outlet obstruction. Needs revision.  I have answered her questions and stressed that her complication rate is much higher because she continues to smoke up to 2 packs of cigarettes per day. She has gotten desperate and feeling at times like she is going to die. I told her that we would likely put in a  feeding tube at the time of her surgery. I would like to tailor a procedure that minimizes her risk and will giver her  some opportunity at oral alimentation.    Orders in EPIC    Matt B. Criss Pallone, MD, FACS  [facility] [phone] beeper [phone]  06/11/2013 11:04 AM    Patient requested a script for hydrocodone for pain.  Will give oral elixir.  

## 2013-06-11 NOTE — Patient Instructions (Signed)
Gastroparesis  Gastroparesis is also called slowed stomach emptying (delayed gastric emptying). It is a condition in which the stomach takes too long to empty its contents. It often happens in people with diabetes.  CAUSES  Gastroparesis happens when nerves to the stomach are damaged or stop working. When the nerves are damaged, the muscles of the stomach and intestines do not work normally. The movement of food is slowed or stopped. High blood glucose (sugar) causes changes in nerves and can damage the blood vessels that carry oxygen and nutrients to the nerves. RISK FACTORS  Diabetes.  Post-viral syndromes.  Eating disorders (anorexia, bulimia).  Surgery on the stomach or vagus nerve.  Gastroesophageal reflux disease (rarely).  Smooth muscle disorders (amyloidosis, scleroderma).  Metabolic disorders, including hypothyroidism.  Parkinson disease. SYMPTOMS   Heartburn.  Feeling sick to your stomach (nausea).  Vomiting of undigested food.  An early feeling of fullness when eating.  Weight loss.  Abdominal bloating.  Erratic blood glucose levels.  Lack of appetite.  Gastroesophageal reflux.  Spasms of the stomach wall. Complications can include:  Bacterial overgrowth in stomach. Food stays in the stomach and can ferment and cause bacteria to grow.  Weight loss due to difficulty digesting and absorbing nutrients.  Vomiting.  Obstruction in the stomach. Undigested food can harden and cause nausea and vomiting.  Blood glucose fluctuations caused by inconsistent food absorption. DIAGNOSIS  The diagnosis of gastroparesis is confirmed through one or more of the following tests:  Barium X-rays and scans. These tests look at how long it takes for food to move through the stomach.  Gastric manometry. This test measures electrical and muscular activity in the stomach. A thin tube is passed down the throat into the stomach. The tube contains a wire that takes measurements  of the stomach's electrical and muscular activity as it digests liquids and solid food.  Endoscopy. This procedure is done with a long, thin tube called an endoscope. It is passed through the mouth and gently down the esophagus into the stomach. This tube helps the caregiver look at the lining of the stomach to check for any abnormalities.  Ultrasonography. This can rule out gallbladder disease or pancreatitis. This test will outline and define the shape of the gallbladder and pancreas. TREATMENT   Treatments may include:  Exercise.  Medicines to control nausea and vomiting.  Medicines to stimulate stomach muscles.  Changes in what and when you eat.  Having smaller meals more often.  Eating low-fiber forms of high-fiber foods, such as eating cooked vegetables instead of raw vegetables.  Eating low-fat foods.  Consuming liquids, which are easier to digest.  In severe cases, feeding tubes and intravenous (IV) feeding may be needed. It is important to note that in most cases, treatment does not cure gastroparesis. It is usually a lasting (chronic) condition. Treatment helps you manage the underlying condition so that you can be as healthy and comfortable as possible. Other treatments  A gastric neurostimulator has been developed to assist people with gastroparesis. The battery-operated device is surgically implanted. It emits mild electrical pulses to help improve stomach emptying and to control nausea and vomiting.  The use of botulinum toxin has been shown to improve stomach emptying by decreasing the prolonged contractions of the muscle between the stomach and the small intestine (pyloric sphincter). The benefits are temporary. SEEK MEDICAL CARE IF:   You have diabetes and you are having problems keeping your blood glucose in goal range.  You are having nausea,   vomiting, bloating, or early feelings of fullness with eating.  Your symptoms do not change with a change in  diet. Document Released: 06/10/2005 Document Revised: 10/05/2012 Document Reviewed: 11/17/2008 Exit[employer] Patient Information 2014 [employer].  

## 2013-06-11 NOTE — Addendum Note (Signed)
Addended by: Luretha Murphy B on: 06/11/2013 11:24 AM   Modules accepted: Orders

## 2013-06-15 ENCOUNTER — Other Ambulatory Visit (INDEPENDENT_AMBULATORY_CARE_PROVIDER_SITE_OTHER): Payer: Self-pay | Admitting: Surgery

## 2013-06-15 ENCOUNTER — Telehealth (INDEPENDENT_AMBULATORY_CARE_PROVIDER_SITE_OTHER): Payer: Self-pay | Admitting: *Deleted

## 2013-06-15 MED ORDER — HYDROCODONE-ACETAMINOPHEN 5-325 MG PO TABS: 1.0000 | ORAL_TABLET | Freq: Four times a day (QID) | ORAL | Status: DC | PRN

## 2013-06-15 NOTE — Telephone Encounter (Signed)
Spoke with the patient and informed her that I was able to get Dr. Daphine Deutscher to re-write a new RX for tablet form instead of the elixir.  I explained that the Rx will be ready for pickup at the front desk for her and that she will need to bring the old Rx and give that one back to [place].  She explained that she will do this tomorrow when she has someone who can drive her here. She also understood that she must bring the old Rx for [place] to exchange it.

## 2013-06-15 NOTE — Telephone Encounter (Signed)
Patient called to state she is having a hard time finding a pharmacy who has the Hydrocodone liquid form.  Patient asking if she can just have the pills.  Explained that I will have to ask Dr. Daphine Deutscher about this and then we will update her later this afternoon.  Patient states understanding and agreeable at this time.

## 2013-06-16 ENCOUNTER — Encounter (HOSPITAL_COMMUNITY): Payer: Self-pay | Admitting: Pharmacy Technician

## 2013-06-21 ENCOUNTER — Telehealth (INDEPENDENT_AMBULATORY_CARE_PROVIDER_SITE_OTHER): Payer: Self-pay | Admitting: *Deleted

## 2013-06-21 NOTE — Telephone Encounter (Signed)
Spoke to [place] with [facility] who is going to discharge patient from their care since patient no longer has feeding tube in.

## 2013-06-22 ENCOUNTER — Other Ambulatory Visit (HOSPITAL_COMMUNITY): Payer: Self-pay | Admitting: Surgery

## 2013-06-22 NOTE — Progress Notes (Signed)
Chest x ray 7/14 EPIC

## 2013-06-22 NOTE — Patient Instructions (Addendum)
20 [name]  06/22/2013   Your procedure is scheduled on:  06/29/13  TUESDAY  Report to [facility] at 1115      AM.  Call this number if you have problems the morning of surgery: [phone]     BOWEL PREP PER OFFICE  Remember: FLEETS ENEMA NIGHT BEFORE  SURGERY IN RECTUM-  CALL OFFICE FOR CONFIRMATION OF THIS SINCE WILL BE DRINKING BOWEL PREP  Do not eat food :After Midnight.  Sunday NIGHT--  DRINK CLEAR LIQUIDS ALL DAY Monday - MAY HAVE CLEAR LIQUIDS Tuesday MORNING UNTIL 0745 AM-- THEN NOTHING BY MOUTH   Take these medicines the morning of surgery with A SIP OF WATER:Prozac, Omeprazole                                 May take Hydrocodone if needed   .  Contacts, dentures or partial plates can not be worn to surgery  Leave suitcase in the car. After surgery it may be brought to your room.  For patients admitted to the hospital, checkout time is 11:00 AM day of  discharge.             SPECIAL INSTRUCTIONS- SEE [place] PREPARING FOR SURGERY INSTRUCTION SHEET-     DO NOT WEAR JEWELRY, LOTIONS, POWDERS, OR PERFUMES.  WOMEN-- DO NOT SHAVE LEGS OR UNDERARMS FOR 12 HOURS BEFORE SHOWERS. MEN MAY SHAVE FACE.  Patients discharged the day of surgery will not be allowed to drive home. IF going home the day of surgery, you must have a driver and someone to stay with you for the first 24 hours  Name and phone number of your driver:        ADMISSION                                                                Please read over the following fact sheets that you were given:  Blood Transfusion Sheet  Information                                                                                   Dalen Hennessee  PST 336  [phone]                 FAILURE TO FOLLOW THESE INSTRUCTIONS MAY RESULT IN  CANCELLATION   OF YOUR SURGERY                                                  Patient Signature _____________________________

## 2013-06-23 ENCOUNTER — Encounter (HOSPITAL_COMMUNITY): Payer: Self-pay

## 2013-06-23 ENCOUNTER — Encounter (HOSPITAL_COMMUNITY)
Admission: RE | Admit: 2013-06-23 | Discharge: 2013-06-23 | Disposition: A | Discharge status: Home / Self Care | Payer: Medicare Other | Source: Ambulatory Visit | Attending: Surgery | Admitting: Surgery

## 2013-06-23 DIAGNOSIS — Z01810 Encounter for preprocedural cardiovascular examination: Secondary | ICD-10-CM | POA: Insufficient documentation

## 2013-06-23 DIAGNOSIS — Z01812 Encounter for preprocedural laboratory examination: Secondary | ICD-10-CM | POA: Insufficient documentation

## 2013-06-23 DIAGNOSIS — Z01818 Encounter for other preprocedural examination: Secondary | ICD-10-CM | POA: Insufficient documentation

## 2013-06-23 HISTORY — DX: Personal history of other medical treatment: Z92.89

## 2013-06-23 LAB — CBC
HCT: 38.3 % (ref 36.0–46.0)
Hemoglobin: 12.3 g/dL (ref 12.0–15.0)
MCH: 29.9 pg (ref 26.0–34.0)
MCHC: 32.1 g/dL (ref 30.0–36.0)
MCV: 93 fL (ref 78.0–100.0)
Platelets: 378 10*3/uL (ref 150–400)
RBC: 4.12 MIL/uL (ref 3.87–5.11)
RDW: 14.8 % (ref 11.5–15.5)

## 2013-06-23 LAB — BASIC METABOLIC PANEL
CO2: 26 mEq/L (ref 19–32)
Calcium: 9.4 mg/dL (ref 8.4–10.5)
Creatinine, Ser: 0.58 mg/dL (ref 0.50–1.10)
GFR calc Af Amer: >90 mL/min (ref >90)
GFR calc non Af Amer: >90 mL/min (ref >90)
Glucose, Bld: 112 mg/dL — ABNORMAL HIGH (ref 70–99)
Potassium: 4 mEq/L (ref 3.7–5.3)
Sodium: 140 mEq/L (ref 137–147)

## 2013-06-29 ENCOUNTER — Encounter (HOSPITAL_COMMUNITY): Payer: Medicare Other | Admitting: Certified Registered Nurse Anesthetist

## 2013-06-29 ENCOUNTER — Inpatient Hospital Stay (HOSPITAL_COMMUNITY): Payer: Medicare Other | Admitting: Certified Registered Nurse Anesthetist

## 2013-06-29 ENCOUNTER — Encounter (HOSPITAL_COMMUNITY)
Admission: RE | Disposition: A | Discharge status: Home Health | Payer: Self-pay | Source: Ambulatory Visit | Attending: Surgery

## 2013-06-29 ENCOUNTER — Inpatient Hospital Stay (HOSPITAL_COMMUNITY)
Admission: RE | Admit: 2013-06-29 | Discharge: 2013-07-13 | DRG: 327 | Disposition: A | Discharge status: Home Health | Payer: Medicare Other | Source: Ambulatory Visit | Attending: Surgery | Admitting: Surgery

## 2013-06-29 ENCOUNTER — Encounter (HOSPITAL_COMMUNITY): Payer: Self-pay | Admitting: *Deleted

## 2013-06-29 DIAGNOSIS — F411 Generalized anxiety disorder: Secondary | ICD-10-CM | POA: Diagnosis present

## 2013-06-29 DIAGNOSIS — J449 Chronic obstructive pulmonary disease, unspecified: Secondary | ICD-10-CM | POA: Diagnosis present

## 2013-06-29 DIAGNOSIS — Z9884 Bariatric surgery status: Secondary | ICD-10-CM

## 2013-06-29 DIAGNOSIS — F17200 Nicotine dependence, unspecified, uncomplicated: Secondary | ICD-10-CM | POA: Diagnosis present

## 2013-06-29 DIAGNOSIS — Z5331 Laparoscopic surgical procedure converted to open procedure: Secondary | ICD-10-CM

## 2013-06-29 DIAGNOSIS — K311 Adult hypertrophic pyloric stenosis: Secondary | ICD-10-CM

## 2013-06-29 DIAGNOSIS — K267 Chronic duodenal ulcer without hemorrhage or perforation: Principal | ICD-10-CM | POA: Diagnosis present

## 2013-06-29 DIAGNOSIS — E440 Moderate protein-calorie malnutrition: Secondary | ICD-10-CM | POA: Diagnosis present

## 2013-06-29 DIAGNOSIS — F3289 Other specified depressive episodes: Secondary | ICD-10-CM | POA: Diagnosis present

## 2013-06-29 DIAGNOSIS — K277 Chronic peptic ulcer, site unspecified, without hemorrhage or perforation: Secondary | ICD-10-CM

## 2013-06-29 DIAGNOSIS — IMO0002 Reserved for concepts with insufficient information to code with codable children: Secondary | ICD-10-CM | POA: Diagnosis not present

## 2013-06-29 DIAGNOSIS — F331 Major depressive disorder, recurrent, moderate: Secondary | ICD-10-CM | POA: Diagnosis present

## 2013-06-29 DIAGNOSIS — J4489 Other specified chronic obstructive pulmonary disease: Secondary | ICD-10-CM | POA: Diagnosis present

## 2013-06-29 DIAGNOSIS — Y833 Surgical operation with formation of external stoma as the cause of abnormal reaction of the patient, or of later complication, without mention of misadventure at the time of the procedure: Secondary | ICD-10-CM | POA: Diagnosis not present

## 2013-06-29 DIAGNOSIS — Z8711 Personal history of peptic ulcer disease: Secondary | ICD-10-CM

## 2013-06-29 DIAGNOSIS — K3184 Gastroparesis: Secondary | ICD-10-CM | POA: Diagnosis present

## 2013-06-29 DIAGNOSIS — K219 Gastro-esophageal reflux disease without esophagitis: Secondary | ICD-10-CM | POA: Diagnosis present

## 2013-06-29 DIAGNOSIS — F329 Major depressive disorder, single episode, unspecified: Secondary | ICD-10-CM | POA: Diagnosis present

## 2013-06-29 DIAGNOSIS — Z79899 Other long term (current) drug therapy: Secondary | ICD-10-CM

## 2013-06-29 DIAGNOSIS — K5909 Other constipation: Secondary | ICD-10-CM | POA: Diagnosis present

## 2013-06-29 DIAGNOSIS — K911 Postgastric surgery syndromes: Secondary | ICD-10-CM

## 2013-06-29 HISTORY — DX: Pneumonia, unspecified organism: J18.9

## 2013-06-29 HISTORY — DX: Chronic duodenal ulcer without hemorrhage or perforation: K26.7

## 2013-06-29 HISTORY — DX: Menopausal and female climacteric states: N95.1

## 2013-06-29 HISTORY — DX: Adult hypertrophic pyloric stenosis: K31.1

## 2013-06-29 HISTORY — DX: Edema, unspecified: R60.9

## 2013-06-29 HISTORY — DX: Other specified postprocedural states: Z98.890

## 2013-06-29 HISTORY — PX: LAPAROSCOPIC PARTIAL GASTRECTOMY: SHX5908

## 2013-06-29 LAB — CBC
HCT: 31.5 % — ABNORMAL LOW (ref 36.0–46.0)
HEMOGLOBIN: 9.7 g/dL — AB (ref 12.0–15.0)
MCH: 28.7 pg (ref 26.0–34.0)
MCHC: 30.8 g/dL (ref 30.0–36.0)
MCV: 93.2 fL (ref 78.0–100.0)
Platelets: 272 10*3/uL (ref 150–400)
RBC: 3.38 MIL/uL — AB (ref 3.87–5.11)
RDW: 14.5 % (ref 11.5–15.5)
WBC: 11.9 10*3/uL — AB (ref 4.0–10.5)

## 2013-06-29 LAB — TYPE AND SCREEN
ABO/RH(D): A POS
Antibody Screen: NEGATIVE

## 2013-06-29 LAB — CREATININE, SERUM
CREATININE: 0.65 mg/dL (ref 0.50–1.10)
GFR calc non Af Amer: >90 mL/min (ref >90)

## 2013-06-29 SURGERY — LAPAROSCOPIC PARTIAL GASTRECTOMY
Anesthesia: General | Site: Abdomen

## 2013-06-29 MED ORDER — DEXTROSE 5 % IV SOLN
1.0000 g | Freq: Four times a day (QID) | INTRAVENOUS | Status: AC
  Administered 2013-06-29: 1 g via INTRAVENOUS
  Filled 2013-06-29: qty 1

## 2013-06-29 MED ORDER — PROPOFOL 10 MG/ML IV BOLUS
INTRAVENOUS | Status: AC
  Filled 2013-06-29: qty 20

## 2013-06-29 MED ORDER — MIDAZOLAM HCL 2 MG/2ML IJ SOLN
INTRAMUSCULAR | Status: AC
  Filled 2013-06-29: qty 2

## 2013-06-29 MED ORDER — GLYCOPYRROLATE 0.2 MG/ML IJ SOLN
INTRAMUSCULAR | Status: DC | PRN
  Administered 2013-06-29: 0.4 mg via INTRAVENOUS

## 2013-06-29 MED ORDER — LACTATED RINGERS IV SOLN: INTRAVENOUS | Status: DC

## 2013-06-29 MED ORDER — 0.9 % SODIUM CHLORIDE (POUR BTL) OPTIME
TOPICAL | Status: DC | PRN
  Administered 2013-06-29 (×3): 1000 mL

## 2013-06-29 MED ORDER — HYDROMORPHONE HCL PF 1 MG/ML IJ SOLN
0.2500 mg | INTRAMUSCULAR | Status: DC | PRN
  Administered 2013-06-29 (×6): 0.25 mg via INTRAVENOUS

## 2013-06-29 MED ORDER — MIDAZOLAM HCL 5 MG/5ML IJ SOLN
INTRAMUSCULAR | Status: DC | PRN
Start: 2013-06-29 — End: 2013-06-29
  Administered 2013-06-29 (×2): 1 mg via INTRAVENOUS

## 2013-06-29 MED ORDER — LACTATED RINGERS IV SOLN
INTRAVENOUS | Status: DC
  Administered 2013-06-29 (×3): via INTRAVENOUS

## 2013-06-29 MED ORDER — DEXTROSE 5 % IV SOLN
INTRAVENOUS | Status: AC
  Filled 2013-06-29: qty 1

## 2013-06-29 MED ORDER — HYDROMORPHONE HCL PF 1 MG/ML IJ SOLN
INTRAMUSCULAR | Status: AC
  Administered 2013-06-29: 0.5 mg via INTRAVENOUS
  Filled 2013-06-29: qty 1

## 2013-06-29 MED ORDER — FLEET ENEMA 7-19 GM/118ML RE ENEM: 1.0000 | ENEMA | Freq: Once | RECTAL | Status: DC

## 2013-06-29 MED ORDER — BUPIVACAINE LIPOSOME 1.3 % IJ SUSP
20.0000 mL | Freq: Once | INTRAMUSCULAR | Status: DC
  Filled 2013-06-29: qty 20

## 2013-06-29 MED ORDER — LACTATED RINGERS IR SOLN
Status: DC | PRN
  Administered 2013-06-29: 1

## 2013-06-29 MED ORDER — KCL IN DEXTROSE-NACL 20-5-0.45 MEQ/L-%-% IV SOLN
INTRAVENOUS | Status: DC
  Administered 2013-06-29: 19:00:00 via INTRAVENOUS
  Administered 2013-06-30: 1000 mL via INTRAVENOUS
  Administered 2013-06-30 – 2013-07-02 (×5): via INTRAVENOUS
  Administered 2013-07-02 – 2013-07-03 (×2): 100 mL/h via INTRAVENOUS
  Administered 2013-07-03 – 2013-07-05 (×5): via INTRAVENOUS
  Administered 2013-07-06 (×2): 100 mL via INTRAVENOUS
  Administered 2013-07-06 – 2013-07-09 (×4): via INTRAVENOUS
  Filled 2013-06-29 (×25): qty 1000

## 2013-06-29 MED ORDER — ROCURONIUM BROMIDE 100 MG/10ML IV SOLN
INTRAVENOUS | Status: DC | PRN
  Administered 2013-06-29: 10 mg via INTRAVENOUS
  Administered 2013-06-29: 30 mg via INTRAVENOUS
  Administered 2013-06-29: 10 mg via INTRAVENOUS

## 2013-06-29 MED ORDER — HYDROMORPHONE HCL PF 2 MG/ML IJ SOLN
INTRAMUSCULAR | Status: AC
  Filled 2013-06-29: qty 1

## 2013-06-29 MED ORDER — PROPOFOL 10 MG/ML IV BOLUS
INTRAVENOUS | Status: DC | PRN
  Administered 2013-06-29: 200 mg via INTRAVENOUS

## 2013-06-29 MED ORDER — SODIUM CHLORIDE 0.9 % IJ SOLN
INTRAMUSCULAR | Status: AC
  Filled 2013-06-29: qty 10

## 2013-06-29 MED ORDER — EPHEDRINE SULFATE 50 MG/ML IJ SOLN
INTRAMUSCULAR | Status: AC
  Filled 2013-06-29: qty 1

## 2013-06-29 MED ORDER — KCL IN DEXTROSE-NACL 20-5-0.45 MEQ/L-%-% IV SOLN
INTRAVENOUS | Status: AC
  Filled 2013-06-29: qty 1000

## 2013-06-29 MED ORDER — ONDANSETRON HCL 4 MG PO TABS
4.0000 mg | ORAL_TABLET | Freq: Four times a day (QID) | ORAL | Status: DC | PRN
  Administered 2013-07-09: 4 mg via ORAL
  Filled 2013-06-29: qty 1

## 2013-06-29 MED ORDER — HYDROMORPHONE HCL PF 1 MG/ML IJ SOLN
0.5000 mg | INTRAMUSCULAR | Status: DC | PRN
  Administered 2013-06-29 (×2): 0.5 mg via INTRAVENOUS
  Filled 2013-06-29 (×2): qty 1

## 2013-06-29 MED ORDER — HEPARIN SODIUM (PORCINE) 5000 UNIT/ML IJ SOLN
5000.0000 [IU] | Freq: Three times a day (TID) | INTRAMUSCULAR | Status: DC
  Administered 2013-06-29 – 2013-07-13 (×41): 5000 [IU] via SUBCUTANEOUS
  Filled 2013-06-29 (×44): qty 1

## 2013-06-29 MED ORDER — HYDROMORPHONE HCL PF 1 MG/ML IJ SOLN
INTRAMUSCULAR | Status: AC
  Filled 2013-06-29: qty 1

## 2013-06-29 MED ORDER — ONDANSETRON HCL 4 MG/2ML IJ SOLN
INTRAMUSCULAR | Status: AC
  Filled 2013-06-29: qty 2

## 2013-06-29 MED ORDER — CEFOXITIN SODIUM 1 G IV SOLR
INTRAVENOUS | Status: AC
  Filled 2013-06-29 (×2): qty 1

## 2013-06-29 MED ORDER — EPHEDRINE SULFATE 50 MG/ML IJ SOLN
INTRAMUSCULAR | Status: DC | PRN
  Administered 2013-06-29 (×2): 10 mg via INTRAVENOUS

## 2013-06-29 MED ORDER — NEOSTIGMINE METHYLSULFATE 1 MG/ML IJ SOLN
INTRAMUSCULAR | Status: DC | PRN
  Administered 2013-06-29: 3 mg via INTRAVENOUS

## 2013-06-29 MED ORDER — LIDOCAINE HCL (CARDIAC) 20 MG/ML IV SOLN
INTRAVENOUS | Status: DC | PRN
  Administered 2013-06-29: 100 mg via INTRAVENOUS

## 2013-06-29 MED ORDER — ROCURONIUM BROMIDE 100 MG/10ML IV SOLN
INTRAVENOUS | Status: AC
  Filled 2013-06-29: qty 1

## 2013-06-29 MED ORDER — LIDOCAINE HCL (CARDIAC) 20 MG/ML IV SOLN
INTRAVENOUS | Status: AC
  Filled 2013-06-29: qty 5

## 2013-06-29 MED ORDER — DEXAMETHASONE SODIUM PHOSPHATE 10 MG/ML IJ SOLN
INTRAMUSCULAR | Status: DC | PRN
  Administered 2013-06-29: 10 mg via INTRAVENOUS

## 2013-06-29 MED ORDER — HYDROMORPHONE HCL PF 1 MG/ML IJ SOLN
INTRAMUSCULAR | Status: DC | PRN
  Administered 2013-06-29 (×2): 0.5 mg via INTRAVENOUS
  Administered 2013-06-29: 1 mg via INTRAVENOUS

## 2013-06-29 MED ORDER — HEPARIN SODIUM (PORCINE) 5000 UNIT/ML IJ SOLN
5000.0000 [IU] | Freq: Once | INTRAMUSCULAR | Status: AC
  Administered 2013-06-29: 5000 [IU] via SUBCUTANEOUS
  Filled 2013-06-29: qty 1

## 2013-06-29 MED ORDER — SUCCINYLCHOLINE CHLORIDE 20 MG/ML IJ SOLN
INTRAMUSCULAR | Status: DC | PRN
  Administered 2013-06-29: 100 mg via INTRAVENOUS

## 2013-06-29 MED ORDER — ONDANSETRON HCL 4 MG/2ML IJ SOLN
INTRAMUSCULAR | Status: DC | PRN
  Administered 2013-06-29: 4 mg via INTRAVENOUS

## 2013-06-29 MED ORDER — DEXAMETHASONE SODIUM PHOSPHATE 10 MG/ML IJ SOLN
INTRAMUSCULAR | Status: AC
  Filled 2013-06-29: qty 1

## 2013-06-29 MED ORDER — JEVITY 1.2 CAL PO LIQD
1000.0000 mL | ORAL | Status: DC
  Administered 2013-06-29: 21:00:00

## 2013-06-29 MED ORDER — ONDANSETRON HCL 4 MG/2ML IJ SOLN
4.0000 mg | Freq: Four times a day (QID) | INTRAMUSCULAR | Status: DC | PRN
  Administered 2013-06-30: 4 mg via INTRAVENOUS
  Filled 2013-06-29: qty 2

## 2013-06-29 MED ORDER — FENTANYL CITRATE 0.05 MG/ML IJ SOLN
INTRAMUSCULAR | Status: DC | PRN
  Administered 2013-06-29 (×2): 50 ug via INTRAVENOUS
  Administered 2013-06-29: 25 ug via INTRAVENOUS
  Administered 2013-06-29 (×2): 50 ug via INTRAVENOUS
  Administered 2013-06-29: 25 ug via INTRAVENOUS

## 2013-06-29 MED ORDER — DEXTROSE 5 % IV SOLN
INTRAVENOUS | Status: AC
  Filled 2013-06-29 (×2): qty 1

## 2013-06-29 MED ORDER — FENTANYL CITRATE 0.05 MG/ML IJ SOLN
INTRAMUSCULAR | Status: AC
  Filled 2013-06-29: qty 5

## 2013-06-29 MED ORDER — DEXTROSE 5 % IV SOLN
2.0000 g | INTRAVENOUS | Status: AC
  Administered 2013-06-29: 2 g via INTRAVENOUS
  Filled 2013-06-29: qty 2

## 2013-06-29 SURGICAL SUPPLY — 75 items
APPLICATOR COTTON TIP 6IN STRL (MISCELLANEOUS) ×6 IMPLANT
BENZOIN TINCTURE PRP APPL 2/3 (GAUZE/BANDAGES/DRESSINGS) IMPLANT
BLADE HEX COATED 2.75 (ELECTRODE) ×3 IMPLANT
BLADE SURG 15 STRL LF DISP TIS (BLADE) ×1 IMPLANT
BLADE SURG 15 STRL SS (BLADE) ×2
BLADE SURG SZ10 CARB STEEL (BLADE) ×6 IMPLANT
CABLE HIGH FREQUENCY MONO STRZ (ELECTRODE) ×3 IMPLANT
CANISTER SUCTION 2500CC (MISCELLANEOUS) ×6 IMPLANT
CATH ROBINSON RED A/P 14FR (CATHETERS) ×3 IMPLANT
CLIP SUT LAPRA TY ABSORB (SUTURE) ×3 IMPLANT
CLOSURE WOUND 1/2 X4 (GAUZE/BANDAGES/DRESSINGS)
DEVICE SUTURE ENDOST 10MM (ENDOMECHANICALS) ×3 IMPLANT
DISSECTOR BLUNT TIP ENDO 5MM (MISCELLANEOUS) ×3 IMPLANT
DRAIN PENROSE 18X1/4 LTX STRL (WOUND CARE) ×3 IMPLANT
DRAPE CAMERA CLOSED 9X96 (DRAPES) ×3 IMPLANT
ELECT COATED BLADE 2.86 ST (ELECTRODE) ×3 IMPLANT
GAUZE SPONGE 4X4 16PLY XRAY LF (GAUZE/BANDAGES/DRESSINGS) ×3 IMPLANT
GLOVE BIOGEL M 8.0 STRL (GLOVE) ×3 IMPLANT
GOWN STRL REUS W/TWL LRG LVL3 (GOWN DISPOSABLE) ×3 IMPLANT
GOWN STRL REUS W/TWL XL LVL3 (GOWN DISPOSABLE) ×6 IMPLANT
HANDLE STAPLE EGIA 4 XL (STAPLE) ×6 IMPLANT
HOVERMATT SINGLE USE (MISCELLANEOUS) ×3 IMPLANT
KIT BASIN OR (CUSTOM PROCEDURE TRAY) ×3 IMPLANT
KIT GASTRIC LAVAGE 34FR ADT (SET/KITS/TRAYS/PACK) ×3 IMPLANT
LIGASURE IMPACT 36 18CM CVD LR (INSTRUMENTS) ×3 IMPLANT
MARKER SKIN DUAL TIP RULER LAB (MISCELLANEOUS) ×3 IMPLANT
NEEDLE SPNL 22GX3.5 QUINCKE BK (NEEDLE) ×3 IMPLANT
NS IRRIG 1000ML POUR BTL (IV SOLUTION) ×3 IMPLANT
PACK CARDIOVASCULAR III (CUSTOM PROCEDURE TRAY) ×3 IMPLANT
PAD ABD 8X10 STRL (GAUZE/BANDAGES/DRESSINGS) ×3 IMPLANT
PENCIL BUTTON HOLSTER BLD 10FT (ELECTRODE) ×3 IMPLANT
PLUG CATH AND CAP STER (CATHETERS) ×3 IMPLANT
RELOAD EGIA 45 MED/THCK PURPLE (STAPLE) ×6 IMPLANT
RELOAD EGIA 45 TAN VASC (STAPLE) ×3 IMPLANT
RELOAD EGIA 60 MED/THCK PURPLE (STAPLE) ×9 IMPLANT
RELOAD EGIA 60 TAN VASC (STAPLE) ×3 IMPLANT
RELOAD ENDO STITCH 2.0 (ENDOMECHANICALS) ×18
SCISSORS LAP 5X35 DISP (ENDOMECHANICALS) ×3 IMPLANT
SEALANT SURGICAL APPL DUAL CAN (MISCELLANEOUS) ×3 IMPLANT
SET IRRIG TUBING LAPAROSCOPIC (IRRIGATION / IRRIGATOR) ×3 IMPLANT
SHEARS CURVED HARMONIC AC 45CM (MISCELLANEOUS) ×3 IMPLANT
SLEEVE ADV FIXATION 12X100MM (TROCAR) ×6 IMPLANT
SLEEVE ADV FIXATION 5X100MM (TROCAR) ×3 IMPLANT
SLEEVE Z-THREAD 5X100MM (TROCAR) IMPLANT
SOLUTION ANTI FOG 6CC (MISCELLANEOUS) ×3 IMPLANT
SPONGE GAUZE 4X4 12PLY (GAUZE/BANDAGES/DRESSINGS) ×3 IMPLANT
SPONGE LAP 18X18 X RAY DECT (DISPOSABLE) ×3 IMPLANT
STAPLER VISISTAT 35W (STAPLE) ×3 IMPLANT
STRIP CLOSURE SKIN 1/2X4 (GAUZE/BANDAGES/DRESSINGS) IMPLANT
SUT ETHILON 3 0 PS 1 (SUTURE) ×3 IMPLANT
SUT NYLON 3 0 (SUTURE) ×3 IMPLANT
SUT PDS AB 1 TP1 96 (SUTURE) ×6 IMPLANT
SUT PDS AB 3-0 SH 27 (SUTURE) ×12 IMPLANT
SUT PDS AB 4-0 SH 27 (SUTURE) ×3 IMPLANT
SUT RELOAD ENDO STITCH 2 48X1 (ENDOMECHANICALS) ×5
SUT RELOAD ENDO STITCH 2.0 (ENDOMECHANICALS) ×4
SUT SILK 2 0 SH CR/8 (SUTURE) ×3 IMPLANT
SUT SILK 3 0 SH CR/8 (SUTURE) ×3 IMPLANT
SUT VIC AB 2-0 SH 27 (SUTURE) ×2
SUT VIC AB 2-0 SH 27X BRD (SUTURE) ×1 IMPLANT
SUT VIC AB 4-0 SH 18 (SUTURE) ×3 IMPLANT
SUTURE RELOAD END STTCH 2 48X1 (ENDOMECHANICALS) ×5 IMPLANT
SUTURE RELOAD ENDO STITCH 2.0 (ENDOMECHANICALS) ×4 IMPLANT
SYR 20CC LL (SYRINGE) ×3 IMPLANT
SYR 50ML LL SCALE MARK (SYRINGE) ×3 IMPLANT
TAPE CLOTH SURG 4X10 WHT LF (GAUZE/BANDAGES/DRESSINGS) ×3 IMPLANT
TRAY FOLEY CATH 14FRSI W/METER (CATHETERS) ×3 IMPLANT
TROCAR ADV FIXATION 12X100MM (TROCAR) ×3 IMPLANT
TROCAR BLADELESS OPT 5 100 (ENDOMECHANICALS) ×3 IMPLANT
TROCAR XCEL 12X100 BLDLESS (ENDOMECHANICALS) ×3 IMPLANT
TUBING CONNECTING 10 (TUBING) ×2 IMPLANT
TUBING CONNECTING 10' (TUBING) ×1
TUBING ENDO SMARTCAP PENTAX (MISCELLANEOUS) ×3 IMPLANT
TUBING FILTER THERMOFLATOR (ELECTROSURGICAL) ×3 IMPLANT
YANKAUER SUCT BULB TIP NO VENT (SUCTIONS) ×3 IMPLANT

## 2013-06-29 NOTE — Op Note (Signed)
Surgeon: Kaylyn Lim, MD, FACS  Asst:  Gustavus Bryant, M.D. FACS  Anes:  Gen.  Procedure: Laparoscopy with enteral lysis, open subtotal gastrectomy and conversion to Roux-en-Y gastrojejunostomy.  Diagnosis: Chronic peptic ulcer disease without with obstruction and gastric stasis.  Complications: none  EBL:   40 cc  Description of Procedure:  The patient was taken to room 1 and given general anesthesia. The abdomen was prepped with PCMX and draped sterilely. A timeout was performed. Access to the abdomen was achieved with a 0 5 mm Optiview technique through the left upper quadrant. Following insufflation I encountered numerous adhesions which took down after placing 35 mm ports doing sharp lysis with scissors endoscopically. It became very apparent due to the marked inflammatory reaction of the gastrojejunostomy that I would need to open and do a subtotal gastrectomy.  The patient was opened through her previous midline incision without difficulty. The Roux-en-Y limb was identified and traced from the ligament of Treitz up to the previous jejunojejunostomy and the Roux limb up to the inflamed stomach.. Pylorus was identified. The lesser sac was entered and the NG tube was removed. We along the greater curvature taking down the vessels with the LigaSure. Likewise the short gastrics were taken along the lesser curvature down to the pylorus. The duodenum was transected with the endoscopic 6 cm GIA with a purple load. Similarly the proximal stomach was resected with multiple firings of the 6 cm Covidien purple load.  The Roux limb was brought up again and sutured along the staple line at the greater curvature of junction of the remaining stomach. Backwall a suture of running 4-0 PDS was applied. Stapler was inserted in the stomach and duodenum and fired using a purple load 6 cm stapler. The common defect was closed with from either end with running 3-0 PDS. Anterior row of running 3-0 PDS was used make  a second layer on the anastomosis.  A 14 rob red Robinson catheter was placed into the common channel distal to the jejunojejunostomy and Witzel with 4-0 PDS. The brought to the intra-abdominal wall in the left side and this was tacked to the intra-abdominal wall with 4 sutures of 3-0 silk. Holes were cut in the was placed downstream for subsequent enteral feedings. Patient was inspected everything appeared to be in order. Sponge and needle counts reported as correct. The abdomen was closed in running double-stranded 0 #1 PDS. Wound was irrigated closed with staples. Patient are that she is well taken recovery in satisfactory condition.  [name], [place], [facility], [place]

## 2013-06-29 NOTE — Anesthesia Preprocedure Evaluation (Addendum)
Anesthesia Evaluation  Patient identified by MRN, date of birth, ID band Patient awake    Reviewed: Allergy & Precautions, H&P , NPO status , Patient's Chart, lab work & pertinent test results  Airway Mallampati: II TM Distance: >3 FB Neck ROM: full    Dental  (+) Edentulous Upper and Edentulous Lower   Pulmonary asthma , COPDCurrent Smoker,  breath sounds clear to auscultation  Pulmonary exam normal       Cardiovascular Exercise Tolerance: Good negative cardio ROS  Rhythm:regular Rate:Normal     Neuro/Psych Anxiety Depression negative neurological ROS  negative psych ROS   GI/Hepatic negative GI ROS, Neg liver ROS, GERD-  Medicated and Controlled,  Endo/Other  negative endocrine ROS  Renal/GU negative Renal ROS  negative genitourinary   Musculoskeletal   Abdominal   Peds  Hematology negative hematology ROS (+)   Anesthesia Other Findings   Reproductive/Obstetrics negative OB ROS                          Anesthesia Physical Anesthesia Plan  ASA: III  Anesthesia Plan: General   Post-op Pain Management:    Induction: Intravenous  Airway Management Planned: Oral ETT  Additional Equipment:   Intra-op Plan:   Post-operative Plan: Extubation in OR  Informed Consent: I have reviewed the patients History and Physical, chart, labs and discussed the procedure including the risks, benefits and alternatives for the proposed anesthesia with the patient or authorized representative who has indicated his/her understanding and acceptance.   Dental Advisory Given  Plan Discussed with: CRNA and Surgeon  Anesthesia Plan Comments:         Anesthesia Quick Evaluation

## 2013-06-29 NOTE — H&P (View-Only) (Signed)
Chief Complaint:  A chronic gastric outlet obstruction  History of Present Illness:  [name] is an 63 y.o. female who failed to be able to tolerate a feeding tube. This occluded and was removed. She has seen Dr. Lacinda Axon at [facility] who recommended a subtotal gastrectomy. I reviewed her most recent upper GI series and think that we might be able to do a laparoscopic gastrectomy and revise her Billroth II anastomosis. I've explained this to her. She does have increased risk because she continues to smoke.  Past Medical History  Diagnosis Date  . Peptic ulcer disease     dr Oletta Lamas  . Gastric outlet obstruction   . Acute duodenal ulcer with hemorrhage and perforation, with obstruction   . Barrett's esophagus   . Menopause   . GERD (gastroesophageal reflux disease)   . Asthma   . Chronic abdominal pain     narcotic dependence, dr gyarteng-dak at [facility]  . Narcotic abuse   . Anxiety     sees [name] NP at Dr. Radonna Ricker office  . Fx two ribs-open 08-24-11    left 4th and 5th  . Fx of fibula 07-28-11    left fibula, 2 places   . COPD (chronic obstructive pulmonary disease)   . S/P ORIF (open reduction internal fixation) fracture     both hips pinned  . Allergy   . Anemia     Past Surgical History  Procedure Laterality Date  . Esophagogastroduodenoscopy  12-29-09    dr qadeer at [facility], several gastric ulcers  . Vagotomy      lapaproscopic - nissan  . 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 Hernia repair    . Perforated ulcer    . Biopsy thyroid  08-13-11    benign nodule, per Dr. Melida Quitter   . Esophagogastroduodenoscopy N/A 09/25/2012    Procedure: ESOPHAGOGASTRODUODENOSCOPY (EGD);  Surgeon: Beryle Beams, MD;  Location: [facility];  Service: Endoscopy;  Laterality: N/A;  . Joint replacement    . Eye surgery      Current Outpatient Prescriptions  Medication Sig Dispense Refill  . ferrous sulfate 325 (65 FE) MG tablet Take 325 mg by mouth 2  (two) times daily with a meal.      . FLUoxetine (PROZAC) 40 MG capsule TAKE 2 CAPSULES BY MOUTH EVERY DAY  180 capsule  1  . FLUoxetine (PROZAC) 40 MG capsule Take 40 mg by mouth 2 (two) times daily.      Marland Kitchen HYDROcodone-acetaminophen (NORCO/VICODIN) 5-325 MG per tablet Take 1-2 tablets by mouth every 4 (four) hours as needed for moderate pain.  6 tablet  0  . Multiple Vitamin (MULTIVITAMIN WITH MINERALS) TABS tablet Take 1 tablet by mouth daily.      Marland Kitchen omeprazole (PRILOSEC) 40 MG capsule Take 1 capsule (40 mg total) by mouth 2 (two) times daily.  180 capsule  3  . promethazine (PHENERGAN) 25 MG suppository Place 25 mg rectally every 6 (six) hours as needed for nausea or vomiting.       No current facility-administered medications for this visit.   Alprazolam; Celebrex; Morphine; Quetiapine; and Varenicline tartrate Family History  Problem Relation Age of Onset  . Cancer Mother     kidney, magliant tumor on face  . Celiac disease Father    Social History:   reports that she has been smoking Cigarettes.  She has a 90 pack-year smoking history. She has never used smokeless  tobacco. She reports that she does not drink alcohol or use illicit drugs.   REVIEW OF SYSTEMS - PERTINENT POSITIVES ONLY: Not contributory to the present illness.  Physical Exam:   Blood pressure 106/66, pulse 60, temperature 97.1 F (36.2 C), temperature source Temporal, resp. rate 18, height 5\' 3"  (1.6 m), weight 105 lb 6.4 oz (47.809 kg). Body mass index is 18.68 kg/(m^2).  Gen:  WDWN white female NAD  Neurological: Alert and oriented to person, place, and time. Motor and sensory function is grossly intact  Head: Normocephalic and atraumatic.  Eyes: Conjunctivae are normal. Pupils are equal, round, and reactive to light. No scleral icterus.  Neck: Normal range of motion. Neck supple. No tracheal deviation or thyromegaly present.  Cardiovascular:  SR without murmurs or gallops.  No carotid bruits Respiratory:  Effort normal.  No respiratory distress. No chest wall tenderness. Breath sounds normal.  No wheezes, rales or rhonchi.  Abdomen:  Soft flat no bowel hernias GU: Musculoskeletal: Normal range of motion. Extremities are nontender. No cyanosis, edema or clubbing noted Lymphadenopathy: No cervical, preauricular, postauricular or axillary adenopathy is present Skin: Skin is warm and dry. No rash noted. No diaphoresis. No erythema. No pallor. Pscyh: Normal mood and affect. Behavior is normal. Judgment and thought content normal.   LABORATORY RESULTS: No results found for this or any previous visit (from the past 48 hour(s)).  RADIOLOGY RESULTS: No results found.  Problem List: Patient Active Problem List   Diagnosis Date Noted  . Moderate malnutrition 05/13/2013  . Lap Nissen + truncal vagotomy July 2008 05/13/2013  . COPD (chronic obstructive pulmonary disease) 11/07/2010  . Pneumonia, organism unspecified 11/07/2010  . DEPRESSION 06/06/2010  . LUNG NODULE 03/15/2010  . 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 FEVER, RECURRENT 08/11/2009  . WEIGHT LOSS 08/11/2009  . LEG EDEMA 01/19/2008  . GASTRIC OUTLET OBSTRUCTION 08/28/2007  . CIGARETTE SMOKER 08/24/2007  . GASTROPARESIS 06/03/2007  . PEPTIC ULCER DISEASE 04/30/2007  . SYMPTOM, PAIN, ABDOMINAL, GENERALIZED 03/16/2007  . ANXIETY 01/07/2007  . MENOPAUSE 01/07/2007  . BARRETT'S ESOPHAGUS, HX OF 07/03/2005  . ACUTE DUODEN ULCER W/HEMORR PERF&OBSTRUCTION 06/26/2004    Assessment & Plan: Chronic gastric outlet obstruction after prior vagotomy, Nissen fundoplication, B2 gastrojejunostomy. Now with gastric outlet obstruction. Needs revision.  I have answered her questions and stressed that her complication rate is much higher because she continues to smoke up to 2 packs of cigarettes per day. She has gotten desperate and feeling at times like she is going to die. I told her that we would likely put in a  feeding tube at the time of her surgery. I would like to tailor a procedure that minimizes her risk and will giver her  some opportunity at oral alimentation.    Orders in [place]. Hassell Done, MD, [facility] [phone] beeper [phone]  06/11/2013 11:04 AM    Patient requested a script for hydrocodone for pain.  Will give oral elixir.

## 2013-06-29 NOTE — Anesthesia Postprocedure Evaluation (Signed)
  Anesthesia Post-op Note  Patient: [name]  Procedure(s) Performed: Procedure(s) (LRB): Gastrectomy and GJ Tube placement (N/A)  Patient Location: PACU  Anesthesia Type: General  Level of Consciousness: awake and alert   Airway and Oxygen Therapy: Patient Spontanous Breathing  Post-op Pain: mild  Post-op Assessment: Post-op Vital signs reviewed, Patient's Cardiovascular Status Stable, Respiratory Function Stable, Patent Airway and No signs of Nausea or vomiting  Last Vitals:  Filed Vitals:   06/29/13 1742  BP: 118/54  Pulse: 74  Temp: 37.1 C  Resp: 12    Post-op Vital Signs: stable   Complications: No apparent anesthesia complications

## 2013-06-29 NOTE — Transfer of Care (Signed)
Immediate Anesthesia Transfer of Care Note  Patient: [name]  Procedure(s) Performed: Procedure(s): Gastrectomy and GJ Tube placement (N/A)  Patient Location: PACU  Anesthesia Type:General  Level of Consciousness: awake and patient cooperative  Airway & Oxygen Therapy: Patient Spontanous Breathing and Patient connected to nasal cannula oxygen  Post-op Assessment: Report given to PACU RN and Post -op Vital signs reviewed and stable  Post vital signs: stable  Complications: No apparent anesthesia complications

## 2013-06-29 NOTE — Anesthesia Procedure Notes (Addendum)
Procedure Name: Intubation Date/Time: 06/29/2013 2:46 PM Performed by: Harle Stanford R Patient Re-evaluated:Patient Re-evaluated prior to inductionOxygen Delivery Method: Circle system utilized Preoxygenation: Pre-oxygenation with 100% oxygen Intubation Type: IV induction Ventilation: Mask ventilation without difficulty Laryngoscope Size: Mac and 3 Grade View: Grade I Tube type: Oral Tube size: 7.0 mm Number of attempts: 1 Airway Equipment and Method: Stylet Placement Confirmation: ETT inserted through vocal cords under direct vision,  positive ETCO2 and breath sounds checked- equal and bilateral Secured at: 20 cm Tube secured with: Tape Dental Injury: Teeth and Oropharynx as per pre-operative assessment

## 2013-06-29 NOTE — Interval H&P Note (Signed)
History and Physical Interval Note:  06/29/2013 2:02 PM  [name]  has presented today for surgery, with the diagnosis of gastric outlet obstruction   The various methods of treatment have been discussed with the patient and family. After consideration of risks, benefits and other options for treatment, the patient has consented to  Procedure(s): LAPAROSCOPIC PARTIAL GASTRECTOMY and revision of BII anastomosis  (N/A) as a surgical intervention .  The patient's history has been reviewed, patient examined, no change in status, stable for surgery.  I have reviewed the patient's chart and labs.  Questions were answered to the patient's satisfaction.     Ali Mohl B

## 2013-06-30 ENCOUNTER — Encounter (HOSPITAL_COMMUNITY): Payer: Self-pay | Admitting: Surgery

## 2013-06-30 LAB — CBC
HCT: 30.4 % — ABNORMAL LOW (ref 36.0–46.0)
HEMOGLOBIN: 9.6 g/dL — AB (ref 12.0–15.0)
MCH: 29.3 pg (ref 26.0–34.0)
MCHC: 31.6 g/dL (ref 30.0–36.0)
MCV: 92.7 fL (ref 78.0–100.0)
Platelets: 250 10*3/uL (ref 150–400)
RBC: 3.28 MIL/uL — ABNORMAL LOW (ref 3.87–5.11)
RDW: 14.4 % (ref 11.5–15.5)
WBC: 12 10*3/uL — AB (ref 4.0–10.5)

## 2013-06-30 LAB — COMPREHENSIVE METABOLIC PANEL
ALBUMIN: 2.6 g/dL — AB (ref 3.5–5.2)
ALT: 33 U/L (ref 0–35)
AST: 50 U/L — AB (ref 0–37)
Alkaline Phosphatase: 53 U/L (ref 39–117)
BUN: 9 mg/dL (ref 6–23)
CALCIUM: 8.2 mg/dL — AB (ref 8.4–10.5)
CO2: 21 mEq/L (ref 19–32)
Chloride: 102 mEq/L (ref 96–112)
Creatinine, Ser: 0.59 mg/dL (ref 0.50–1.10)
GFR calc non Af Amer: >90 mL/min (ref >90)
Glucose, Bld: 241 mg/dL — ABNORMAL HIGH (ref 70–99)
POTASSIUM: 4.5 meq/L (ref 3.7–5.3)
SODIUM: 134 meq/L — AB (ref 137–147)
Total Bilirubin: <0.2 mg/dL — ABNORMAL LOW (ref 0.3–1.2)
Total Protein: 5.1 g/dL — ABNORMAL LOW (ref 6.0–8.3)

## 2013-06-30 LAB — MRSA PCR SCREENING: MRSA by PCR: NEGATIVE

## 2013-06-30 MED ORDER — HYDROMORPHONE HCL PF 1 MG/ML IJ SOLN
0.5000 mg | INTRAMUSCULAR | Status: DC | PRN
  Administered 2013-06-30: 1 mg via INTRAVENOUS
  Administered 2013-06-30 (×2): 2 mg via INTRAVENOUS
  Administered 2013-06-30 (×3): 1 mg via INTRAVENOUS
  Administered 2013-06-30 (×5): 2 mg via INTRAVENOUS
  Administered 2013-07-01 (×8): 1 mg via INTRAVENOUS
  Filled 2013-06-30: qty 2
  Filled 2013-06-30 (×6): qty 1
  Filled 2013-06-30: qty 2
  Filled 2013-06-30 (×2): qty 1
  Filled 2013-06-30 (×3): qty 2
  Filled 2013-06-30 (×3): qty 1
  Filled 2013-06-30: qty 2
  Filled 2013-06-30: qty 1
  Filled 2013-06-30 (×2): qty 2
  Filled 2013-06-30: qty 1

## 2013-06-30 MED ORDER — PROMETHAZINE HCL 25 MG/ML IJ SOLN
12.5000 mg | Freq: Four times a day (QID) | INTRAMUSCULAR | Status: DC | PRN
  Administered 2013-06-30: 25 mg via INTRAVENOUS
  Administered 2013-07-01: 12.5 mg via INTRAVENOUS
  Filled 2013-06-30 (×2): qty 1

## 2013-06-30 MED ORDER — CHLORHEXIDINE GLUCONATE 0.12 % MT SOLN
15.0000 mL | Freq: Two times a day (BID) | OROMUCOSAL | Status: DC
  Administered 2013-06-30 – 2013-07-13 (×23): 15 mL via OROMUCOSAL
  Filled 2013-06-30 (×31): qty 15

## 2013-06-30 MED ORDER — ACETAMINOPHEN 650 MG RE SUPP
325.0000 mg | Freq: Four times a day (QID) | RECTAL | Status: DC | PRN
Start: 2013-06-30 — End: 2013-07-01

## 2013-06-30 MED ORDER — JEVITY 1.2 CAL PO LIQD: 1000.0000 mL | ORAL | Status: DC

## 2013-06-30 MED ORDER — NICOTINE 21 MG/24HR TD PT24
21.0000 mg | MEDICATED_PATCH | Freq: Every day | TRANSDERMAL | Status: DC
  Administered 2013-06-30 – 2013-07-13 (×14): 21 mg via TRANSDERMAL
  Filled 2013-06-30 (×16): qty 1

## 2013-06-30 MED ORDER — BIOTENE DRY MOUTH MT LIQD
15.0000 mL | Freq: Two times a day (BID) | OROMUCOSAL | Status: DC
  Administered 2013-06-30 – 2013-07-13 (×20): 15 mL via OROMUCOSAL

## 2013-06-30 NOTE — Progress Notes (Signed)
INITIAL NUTRITION ASSESSMENT  DOCUMENTATION CODES Per approved criteria  -Not Applicable   INTERVENTION: - Recommend increase TF of Jevity 1.2 at 17m/hr by 172mevery 4 hours to goal of 5532mr. Goal rate will provide 1584 calories, 73g protein, and 1065m6mee water and meet 100% of estimated nutritional needs. If IVF d/c, recommend 130ml102mer flushes 4 times/day.  - Diet advancement per MD - Recommend initiation of adult enteral protocol - Will continue to monitor   NUTRITION DIAGNOSIS: Inadequate oral intake related to inability to eat as evidenced by NPO.   Goal: TF to meet >90% of estimated nutritional needs  Monitor:  Weights, labs, diet advancement, TF tolerance/advancement, nausea, stomach pain  Reason for Assessment: New TF  62 y.72 female  Admitting Dx: Gastric outlet obstruction   ASSESSMENT: Admitted with gastric outlet obstruction, had gastrectomy and GJ tube placement yesterday. Pt was started on trickle TF of Jevity 1.2 at 20ml/90mast night.   Met with pt who reports having NGT placed last month for TF however was only on TF for 2-3 days before tube got clogged. Reports she has had stomach pain since the 1990s and had nausea for a long time. Reports she has been eating a lot of iron rich foods recently to give her more energy and typical daily intake includes tuna, spaghetti, and Ensure. Reports she was eating all the time. Pt does not have any teeth. Reports she was weighing 90 pounds recently, now at 111 pounds.   Nutrition Focused Physical Exam:  Subcutaneous Fat:  Orbital Region: WNL Upper Arm Region: mild/moderate wasting Thoracic and Lumbar Region: WNL  Muscle:  Temple Region: WNL Clavicle Bone Region: WNL Clavicle and Acromion Bone Region: WNL Scapular Bone Region: NA Dorsal Hand: WNL Patellar Region: WNL Anterior Thigh Region: NA Posterior Calf Region: NA  Edema: None noted    Height: Ht Readings from Last 1 Encounters:  06/29/13 5' 3"  (1.6 m)    Weight: Wt Readings from Last 1 Encounters:  06/30/13 111 lb 5.3 oz (50.5 kg)    Ideal Body Weight: 115 lb   % Ideal Body Weight: 96%  Wt Readings from Last 10 Encounters:  06/30/13 111 lb 5.3 oz (50.5 kg)  06/30/13 111 lb 5.3 oz (50.5 kg)  06/23/13 106 lb (48.081 kg)  06/11/13 105 lb 6.4 oz (47.809 kg)  05/13/13 105 lb 9.6 oz (47.9 kg)  12/23/12 115 lb (52.164 kg)  04/30/12 132 lb (59.875 kg)  04/16/12 130 lb (58.968 kg)  10/03/11 128 lb (58.06 kg)  09/07/11 127 lb (57.607 kg)    Usual Body Weight: 90 lb per pt  % Usual Body Weight: 123%  BMI:  Body mass index is 19.73 kg/(m^2).  Estimated Nutritional Needs: Kcal: 1400-1600 Protein: 60-75g Fluid: 1.4-1.6L/day  Skin: Abdominal incision   Diet Order: NPO  EDUCATION NEEDS: -No education needs identified at this time   Intake/Output Summary (Last 24 hours) at 06/30/13 1042 Last data filed at 06/30/13 1000  Gross per 24 hour  Intake   3930 ml  Output    910 ml  Net   3020 ml    Last BM: PTA  Labs:   Recent Labs Lab 06/29/13 2043 06/30/13 0335  NA  --  134*  K  --  4.5  CL  --  102  CO2  --  21  BUN  --  9  CREATININE 0.65 0.59  CALCIUM  --  8.2*  GLUCOSE  --  241*  CBG (last 3)  No results found for this basename: GLUCAP,  in the last 72 hours  Scheduled Meds: . antiseptic oral rinse  15 mL Mouth Rinse q12n4p  . chlorhexidine  15 mL Mouth Rinse BID  . heparin  5,000 Units Subcutaneous Q8H    Continuous Infusions: . dextrose 5 % and 0.45 % NaCl with KCl 20 mEq/L 1,000 mL (06/30/13 0503)  . feeding supplement (JEVITY 1.2 CAL) 20 mL/hr at 06/29/13 2042    Past Medical History  Diagnosis Date  . Peptic ulcer disease     dr Oletta Lamas  . Gastric outlet obstruction   . Acute duodenal ulcer with hemorrhage and perforation, with obstruction   . Barrett's esophagus   . Menopause   . GERD (gastroesophageal reflux disease)   . Asthma   . Chronic abdominal pain     narcotic  dependence, dr gyarteng-dak at [facility]  . Narcotic abuse   . Anxiety     sees [name] NP at Dr. Radonna Ricker office  . Fx two ribs-open 08-24-11    left 4th and 5th  . Fx of fibula 07-28-11    left fibula, 2 places   . COPD (chronic obstructive pulmonary disease)   . S/P ORIF (open reduction internal fixation) fracture     both hips pinned  . Allergy   . Anemia   . History of blood transfusion     Past Surgical History  Procedure Laterality Date  . Esophagogastroduodenoscopy  12-29-09    dr qadeer at [facility], several gastric ulcers  . Vagotomy      lapaproscopic - nissan  . 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 Hernia repair    . Perforated ulcer    . Biopsy thyroid  08-13-11    benign nodule, per Dr. Melida Quitter   . Esophagogastroduodenoscopy N/A 09/25/2012    Procedure: ESOPHAGOGASTRODUODENOSCOPY (EGD);  Surgeon: Beryle Beams, MD;  Location: [facility];  Service: Endoscopy;  Laterality: N/A;  . Fracture surgery Bilateral     hips with screw  . Tonsillectomy    . Eye surgery Bilateral     cataract extraction with IOL    Mikey College MS, [place], [place] Pager [phone] After Hours Pager

## 2013-06-30 NOTE — Progress Notes (Signed)
Patient ID: [name], female   DOB: [birth year], 63 y.o.   MRN: [id number] [facility] Progress Note:   1 Day Post-Op  Subjective: Mental status is clear Objective: Vital signs in last 24 hours: Temp:  [97.3 F (36.3 C)-99 F (37.2 C)] 99 F (37.2 C) (01/07 1200) Pulse Rate:  [59-84] 73 (01/06 2300) Resp:  [11-19] 13 (01/07 1300) BP: (84-124)/(39-76) 124/58 mmHg (01/07 1400) SpO2:  [89 %-100 %] 95 % (01/07 1400) Weight:  [110 lb 3.7 oz (50 kg)-111 lb 5.3 oz (50.5 kg)] 111 lb 5.3 oz (50.5 kg) (01/07 0400)  Intake/Output from previous day: 01/06 0701 - 01/07 0700 In: 5102 [I.V.:3350; NG/GT:200] Out: 910 [Urine:810; Blood:100] Intake/Output this shift: Total I/O In: 720 [I.V.:600; NG/GT:120] Out: -   Physical Exam: Work of breathing is normal.  Tolerating tube feedings at 20.  Will increase  Lab Results:  Results for orders placed during the hospital encounter of 06/29/13 (from the past 48 hour(s))  CBC     Status: Abnormal   Collection Time    06/29/13  8:43 PM      Result Value Range   WBC 11.9 (*) 4.0 - 10.5 K/uL   RBC 3.38 (*) 3.87 - 5.11 MIL/uL   Hemoglobin 9.7 (*) 12.0 - 15.0 g/dL   HCT 31.5 (*) 36.0 - 46.0 %   MCV 93.2  78.0 - 100.0 fL   MCH 28.7  26.0 - 34.0 pg   MCHC 30.8  30.0 - 36.0 g/dL   RDW 14.5  11.5 - 15.5 %   Platelets 272  150 - 400 K/uL  CREATININE, SERUM     Status: None   Collection Time    06/29/13  8:43 PM      Result Value Range   Creatinine, Ser 0.65  0.50 - 1.10 mg/dL   GFR calc non Af Amer >90  >90 mL/min   GFR calc Af Amer >90  >90 mL/min   Comment: (NOTE)     The eGFR has been calculated using the CKD EPI equation.     This calculation has not been validated in all clinical situations.     eGFR's persistently <90 mL/min signify possible Chronic Kidney     Disease.  MRSA PCR SCREENING     Status: None   Collection Time    06/30/13  2:36 AM      Result Value Range   MRSA by PCR NEGATIVE  NEGATIVE   Comment:          The GeneXpert MRSA Assay (FDA     approved for NASAL specimens     only), is one component of a     comprehensive MRSA colonization     surveillance program. It is not     intended to diagnose MRSA     infection nor to guide or     monitor treatment for     MRSA infections.  CBC     Status: Abnormal   Collection Time    06/30/13  3:35 AM      Result Value Range   WBC 12.0 (*) 4.0 - 10.5 K/uL   RBC 3.28 (*) 3.87 - 5.11 MIL/uL   Hemoglobin 9.6 (*) 12.0 - 15.0 g/dL   HCT 30.4 (*) 36.0 - 46.0 %   MCV 92.7  78.0 - 100.0 fL   MCH 29.3  26.0 - 34.0 pg   MCHC 31.6  30.0 - 36.0 g/dL   RDW 14.4  11.5 -  15.5 %   Platelets 250  150 - 400 K/uL  COMPREHENSIVE METABOLIC PANEL     Status: Abnormal   Collection Time    06/30/13  3:35 AM      Result Value Range   Sodium 134 (*) 137 - 147 mEq/L   Potassium 4.5  3.7 - 5.3 mEq/L   Chloride 102  96 - 112 mEq/L   CO2 21  19 - 32 mEq/L   Glucose, Bld 241 (*) 70 - 99 mg/dL   BUN 9  6 - 23 mg/dL   Creatinine, Ser 0.59  0.50 - 1.10 mg/dL   Calcium 8.2 (*) 8.4 - 10.5 mg/dL   Total Protein 5.1 (*) 6.0 - 8.3 g/dL   Albumin 2.6 (*) 3.5 - 5.2 g/dL   AST 50 (*) 0 - 37 U/L   ALT 33  0 - 35 U/L   Alkaline Phosphatase 53  39 - 117 U/L   Total Bilirubin <0.2 (*) 0.3 - 1.2 mg/dL   GFR calc non Af Amer >90  >90 mL/min   GFR calc Af Amer >90  >90 mL/min   Comment: (NOTE)     The eGFR has been calculated using the CKD EPI equation.     This calculation has not been validated in all clinical situations.     eGFR's persistently <90 mL/min signify possible Chronic Kidney     Disease.    Radiology/Results: No results found.  Anti-infectives: Anti-infectives   Start     Dose/Rate Route Frequency Ordered Stop   06/29/13 1800  cefOXitin (MEFOXIN) 1 g in dextrose 5 % 50 mL IVPB     1 g 100 mL/hr over 30 Minutes Intravenous 4 times per day 06/29/13 1752 06/29/13 1829   06/29/13 1145  cefOXitin (MEFOXIN) 2 g in dextrose 5 % 50 mL IVPB     2 g 100 mL/hr over  30 Minutes Intravenous On call to O.R. 06/29/13 1127 06/29/13 1450      Assessment/Plan: Problem List: Patient Active Problem List   Diagnosis Date Noted  . 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 Moderate malnutrition 05/13/2013  . Lap Nissen + truncal vagotomy July 2008 05/13/2013  . COPD (chronic obstructive pulmonary disease) 11/07/2010  . Pneumonia, organism unspecified 11/07/2010  . DEPRESSION 06/06/2010  . LUNG NODULE 03/15/2010  . 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 FEVER, RECURRENT 08/11/2009  . WEIGHT LOSS 08/11/2009  . LEG EDEMA 01/19/2008  . GASTRIC OUTLET OBSTRUCTION 08/28/2007  . CIGARETTE SMOKER 08/24/2007  . GASTROPARESIS 06/03/2007  . PEPTIC ULCER DISEASE 04/30/2007  . SYMPTOM, PAIN, ABDOMINAL, GENERALIZED 03/16/2007  . ANXIETY 01/07/2007  . MENOPAUSE 01/07/2007  . BARRETT'S ESOPHAGUS, HX OF 07/03/2005  . ACUTE DUODEN ULCER W/HEMORR PERF&OBSTRUCTION 06/26/2004    Stable thus far.  Will transfer to floor.   1 Day Post-Op    LOS: 1 day   [name], MD, [facility] [phone] beeper [phone]  06/30/2013 2:31 PM

## 2013-06-30 NOTE — Progress Notes (Signed)
CARE MANAGEMENT NOTE 06/30/2013  Patient:  [name], [name]   Account Number:  [id number]  Date Initiated:  06/30/2013  Documentation initiated by:  [name]  Subjective/Objective Assessment:   pt admitted to sdu post op due to low o2 and hypotension. Iv fkd challenge and o2 given.     Action/Plan:   lives independently at home , home is shared with friends.   Anticipated DC Date:  07/03/2013   Anticipated DC Plan:  HOME/SELF CARE  In-house referral  NA      DC Planning Services  NA      [facility] Choice  NA   Choice offered to / List presented to:  NA   DME arranged  NA      DME agency  NA     [place] arranged  NA      [place] agency  NA   Status of service:  In process, will continue to follow Medicare Important Message given?  NA - LOS <3 / Initial given by admissions (If response is "NO", the following Medicare IM given date fields will be blank) Date Medicare IM given:   Date Additional Medicare IM given:    Discharge Disposition:    Per UR Regulation:  Reviewed for med. necessity/level of care/duration of stay  If discussed at [place] of Stay Meetings, dates discussed:    Comments:  [id number]/[name], BSN, [place] [phone] Chart Reviewed for discharge and hospital needs. Discharge needs at time of review:  None present will follow for needs. Review of patient progress due on [id number].

## 2013-06-30 NOTE — Progress Notes (Signed)
Report given to RN on 5th floor.

## 2013-07-01 LAB — BASIC METABOLIC PANEL
BUN: 7 mg/dL (ref 6–23)
CO2: 26 meq/L (ref 19–32)
Calcium: 8.4 mg/dL (ref 8.4–10.5)
Chloride: 104 mEq/L (ref 96–112)
Creatinine, Ser: 0.64 mg/dL (ref 0.50–1.10)
GFR calc Af Amer: >90 mL/min (ref >90)
GFR calc non Af Amer: >90 mL/min (ref >90)
GLUCOSE: 113 mg/dL — AB (ref 70–99)
Potassium: 4.6 mEq/L (ref 3.7–5.3)
SODIUM: 139 meq/L (ref 137–147)

## 2013-07-01 LAB — CBC
HCT: 30.4 % — ABNORMAL LOW (ref 36.0–46.0)
Hemoglobin: 9.3 g/dL — ABNORMAL LOW (ref 12.0–15.0)
MCH: 29.2 pg (ref 26.0–34.0)
MCHC: 30.6 g/dL (ref 30.0–36.0)
MCV: 95.3 fL (ref 78.0–100.0)
Platelets: 275 10*3/uL (ref 150–400)
RBC: 3.19 MIL/uL — AB (ref 3.87–5.11)
RDW: 14.8 % (ref 11.5–15.5)
WBC: 9.1 10*3/uL (ref 4.0–10.5)

## 2013-07-01 MED ORDER — HYDROMORPHONE 0.3 MG/ML IV SOLN
INTRAVENOUS | Status: DC
  Administered 2013-07-01: 7.2 mg via INTRAVENOUS
  Administered 2013-07-01: 4.8 mg via INTRAVENOUS
  Administered 2013-07-01: 13:00:00 via INTRAVENOUS
  Administered 2013-07-01: 2.4 mg via INTRAVENOUS
  Administered 2013-07-02: 7.5 mg via INTRAVENOUS
  Administered 2013-07-02: 3 mg via INTRAVENOUS
  Filled 2013-07-01 (×3): qty 25

## 2013-07-01 MED ORDER — JEVITY 1.2 CAL PO LIQD
1000.0000 mL | ORAL | Status: DC
  Administered 2013-07-01 – 2013-07-03 (×3): 1000 mL
  Filled 2013-07-01 (×3): qty 1000

## 2013-07-01 MED ORDER — ACETAMINOPHEN 650 MG RE SUPP: 325.0000 mg | Freq: Four times a day (QID) | RECTAL | Status: DC | PRN

## 2013-07-01 MED ORDER — NALOXONE HCL 0.4 MG/ML IJ SOLN: 0.4000 mg | INTRAMUSCULAR | Status: DC | PRN

## 2013-07-01 MED ORDER — DIPHENHYDRAMINE HCL 50 MG/ML IJ SOLN
12.5000 mg | Freq: Four times a day (QID) | INTRAMUSCULAR | Status: DC | PRN
  Administered 2013-07-01: 12.5 mg via INTRAVENOUS
  Filled 2013-07-01: qty 1

## 2013-07-01 MED ORDER — ONDANSETRON HCL 4 MG/2ML IJ SOLN
4.0000 mg | Freq: Four times a day (QID) | INTRAMUSCULAR | Status: DC | PRN
  Administered 2013-07-02: 4 mg via INTRAVENOUS
  Filled 2013-07-01: qty 2

## 2013-07-01 MED ORDER — SODIUM CHLORIDE 0.9 % IJ SOLN: 9.0000 mL | INTRAMUSCULAR | Status: DC | PRN

## 2013-07-01 MED ORDER — ACETAMINOPHEN 650 MG RE SUPP: 650.0000 mg | Freq: Four times a day (QID) | RECTAL | Status: DC | PRN

## 2013-07-01 MED ORDER — DIPHENHYDRAMINE HCL 12.5 MG/5ML PO ELIX: 12.5000 mg | ORAL_SOLUTION | Freq: Four times a day (QID) | ORAL | Status: DC | PRN

## 2013-07-01 NOTE — Progress Notes (Signed)
Patient ID: [name], female   DOB: [birth year], 63 y.o.   MRN: [id number] [facility] Surgery Progress Note:   2 Days Post-Op  Subjective: Mental status is clear.  Complaining of more incisional pain Objective: Vital signs in last 24 hours: Temp:  [98.6 F (37 C)-100.2 F (37.9 C)] 99.6 F (37.6 C) (01/08 0541) Pulse Rate:  [72-81] 79 (01/08 0541) Resp:  [13-18] 18 (01/08 0541) BP: (84-130)/(39-75) 101/62 mmHg (01/08 0541) SpO2:  [91 %-100 %] 95 % (01/08 0541)  Intake/Output from previous day: 01/07 0701 - 01/08 0700 In: 2780 [I.V.:2200; NG/GT:560] Out: 1150 [Urine:1150] Intake/Output this shift:    Physical Exam: Work of breathing is normal for someone with underlying COPD.  Tube feedings at 30  Lab Results:  Results for orders placed during the hospital encounter of 06/29/13 (from the past 48 hour(s))  CBC     Status: Abnormal   Collection Time    06/29/13  8:43 PM      Result Value Range   WBC 11.9 (*) 4.0 - 10.5 K/uL   RBC 3.38 (*) 3.87 - 5.11 MIL/uL   Hemoglobin 9.7 (*) 12.0 - 15.0 g/dL   HCT 31.5 (*) 36.0 - 46.0 %   MCV 93.2  78.0 - 100.0 fL   MCH 28.7  26.0 - 34.0 pg   MCHC 30.8  30.0 - 36.0 g/dL   RDW 14.5  11.5 - 15.5 %   Platelets 272  150 - 400 K/uL  CREATININE, SERUM     Status: None   Collection Time    06/29/13  8:43 PM      Result Value Range   Creatinine, Ser 0.65  0.50 - 1.10 mg/dL   GFR calc non Af Amer >90  >90 mL/min   GFR calc Af Amer >90  >90 mL/min   Comment: (NOTE)     The eGFR has been calculated using the CKD EPI equation.     This calculation has not been validated in all clinical situations.     eGFR's persistently <90 mL/min signify possible Chronic Kidney     Disease.  MRSA PCR SCREENING     Status: None   Collection Time    06/30/13  2:36 AM      Result Value Range   MRSA by PCR NEGATIVE  NEGATIVE   Comment:            The GeneXpert MRSA Assay (FDA     approved for NASAL specimens     only), is one component of a      comprehensive MRSA colonization     surveillance program. It is not     intended to diagnose MRSA     infection nor to guide or     monitor treatment for     MRSA infections.  CBC     Status: Abnormal   Collection Time    06/30/13  3:35 AM      Result Value Range   WBC 12.0 (*) 4.0 - 10.5 K/uL   RBC 3.28 (*) 3.87 - 5.11 MIL/uL   Hemoglobin 9.6 (*) 12.0 - 15.0 g/dL   HCT 30.4 (*) 36.0 - 46.0 %   MCV 92.7  78.0 - 100.0 fL   MCH 29.3  26.0 - 34.0 pg   MCHC 31.6  30.0 - 36.0 g/dL   RDW 14.4  11.5 - 15.5 %   Platelets 250  150 - 400 K/uL  COMPREHENSIVE METABOLIC PANEL  Status: Abnormal   Collection Time    06/30/13  3:35 AM      Result Value Range   Sodium 134 (*) 137 - 147 mEq/L   Potassium 4.5  3.7 - 5.3 mEq/L   Chloride 102  96 - 112 mEq/L   CO2 21  19 - 32 mEq/L   Glucose, Bld 241 (*) 70 - 99 mg/dL   BUN 9  6 - 23 mg/dL   Creatinine, Ser 0.59  0.50 - 1.10 mg/dL   Calcium 8.2 (*) 8.4 - 10.5 mg/dL   Total Protein 5.1 (*) 6.0 - 8.3 g/dL   Albumin 2.6 (*) 3.5 - 5.2 g/dL   AST 50 (*) 0 - 37 U/L   ALT 33  0 - 35 U/L   Alkaline Phosphatase 53  39 - 117 U/L   Total Bilirubin <0.2 (*) 0.3 - 1.2 mg/dL   GFR calc non Af Amer >90  >90 mL/min   GFR calc Af Amer >90  >90 mL/min   Comment: (NOTE)     The eGFR has been calculated using the CKD EPI equation.     This calculation has not been validated in all clinical situations.     eGFR's persistently <90 mL/min signify possible Chronic Kidney     Disease.  CBC     Status: Abnormal   Collection Time    07/01/13  5:20 AM      Result Value Range   WBC 9.1  4.0 - 10.5 K/uL   RBC 3.19 (*) 3.87 - 5.11 MIL/uL   Hemoglobin 9.3 (*) 12.0 - 15.0 g/dL   HCT 30.4 (*) 36.0 - 46.0 %   MCV 95.3  78.0 - 100.0 fL   MCH 29.2  26.0 - 34.0 pg   MCHC 30.6  30.0 - 36.0 g/dL   RDW 14.8  11.5 - 15.5 %   Platelets 275  150 - 400 K/uL  BASIC METABOLIC PANEL     Status: Abnormal   Collection Time    07/01/13  5:20 AM      Result Value Range    Sodium 139  137 - 147 mEq/L   Potassium 4.6  3.7 - 5.3 mEq/L   Chloride 104  96 - 112 mEq/L   CO2 26  19 - 32 mEq/L   Glucose, Bld 113 (*) 70 - 99 mg/dL   BUN 7  6 - 23 mg/dL   Creatinine, Ser 0.64  0.50 - 1.10 mg/dL   Calcium 8.4  8.4 - 10.5 mg/dL   GFR calc non Af Amer >90  >90 mL/min   GFR calc Af Amer >90  >90 mL/min   Comment: (NOTE)     The eGFR has been calculated using the CKD EPI equation.     This calculation has not been validated in all clinical situations.     eGFR's persistently <90 mL/min signify possible Chronic Kidney     Disease.    Radiology/Results: No results found.  Anti-infectives: Anti-infectives   Start     Dose/Rate Route Frequency Ordered Stop   06/29/13 1800  cefOXitin (MEFOXIN) 1 g in dextrose 5 % 50 mL IVPB     1 g 100 mL/hr over 30 Minutes Intravenous 4 times per day 06/29/13 1752 06/29/13 1829   06/29/13 1145  cefOXitin (MEFOXIN) 2 g in dextrose 5 % 50 mL IVPB     2 g 100 mL/hr over 30 Minutes Intravenous On call to O.R. 06/29/13 1127 06/29/13 1450  Assessment/Plan: Problem List: Patient Active Problem List   Diagnosis Date Noted  . 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 Moderate malnutrition 05/13/2013  . Lap Nissen + truncal vagotomy July 2008 05/13/2013  . COPD (chronic obstructive pulmonary disease) 11/07/2010  . Pneumonia, organism unspecified 11/07/2010  . DEPRESSION 06/06/2010  . LUNG NODULE 03/15/2010  . 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 FEVER, RECURRENT 08/11/2009  . WEIGHT LOSS 08/11/2009  . LEG EDEMA 01/19/2008  . GASTRIC OUTLET OBSTRUCTION 08/28/2007  . CIGARETTE SMOKER 08/24/2007  . GASTROPARESIS 06/03/2007  . PEPTIC ULCER DISEASE 04/30/2007  . SYMPTOM, PAIN, ABDOMINAL, GENERALIZED 03/16/2007  . ANXIETY 01/07/2007  . MENOPAUSE 01/07/2007  . BARRETT'S ESOPHAGUS, HX OF 07/03/2005  . ACUTE DUODEN ULCER W/HEMORR PERF&OBSTRUCTION 06/26/2004     Will change to PCA dilaudid.  Increase tube feedings to 50 cc/hr.  2 Days Post-Op    LOS: 2 days   [name], MD, [facility] [phone] beeper [phone]  07/01/2013 8:39 AM

## 2013-07-02 ENCOUNTER — Encounter (INDEPENDENT_AMBULATORY_CARE_PROVIDER_SITE_OTHER): Payer: Medicare Other | Admitting: Surgery

## 2013-07-02 MED ORDER — SODIUM CHLORIDE 0.9 % IJ SOLN: 9.0000 mL | INTRAMUSCULAR | Status: DC | PRN

## 2013-07-02 MED ORDER — HYDROMORPHONE 0.3 MG/ML IV SOLN: INTRAVENOUS | Status: DC

## 2013-07-02 MED ORDER — NALOXONE HCL 0.4 MG/ML IJ SOLN: 0.4000 mg | INTRAMUSCULAR | Status: DC | PRN

## 2013-07-02 MED ORDER — DIPHENHYDRAMINE HCL 50 MG/ML IJ SOLN: 12.5000 mg | Freq: Four times a day (QID) | INTRAMUSCULAR | Status: DC | PRN

## 2013-07-02 MED ORDER — HYDROMORPHONE 0.3 MG/ML IV SOLN
INTRAVENOUS | Status: DC
  Administered 2013-07-02: 14:00:00 via INTRAVENOUS
  Administered 2013-07-02: 2.1 mg via INTRAVENOUS
  Administered 2013-07-02: 3.9 mg via INTRAVENOUS
  Administered 2013-07-02: 21:00:00 via INTRAVENOUS
  Administered 2013-07-03: 2.1 mg via INTRAVENOUS
  Administered 2013-07-03: 4.5 mg via INTRAVENOUS
  Administered 2013-07-03: 3.792 mg via INTRAVENOUS
  Administered 2013-07-03: 0.6 mg via INTRAVENOUS
  Administered 2013-07-03: 36.25 mg via INTRAVENOUS
  Administered 2013-07-03: 3.2 mg via INTRAVENOUS
  Administered 2013-07-04: 2.4 mg via INTRAVENOUS
  Administered 2013-07-04: 2.7 mg via INTRAVENOUS
  Administered 2013-07-04: 0.6 mg via INTRAVENOUS
  Administered 2013-07-04: 14.7 mg via INTRAVENOUS
  Administered 2013-07-04: 2 mg via INTRAVENOUS
  Administered 2013-07-04: 08:00:00 via INTRAVENOUS
  Administered 2013-07-05: 2.1 mg via INTRAVENOUS
  Administered 2013-07-05: 14:00:00 via INTRAVENOUS
  Administered 2013-07-05: 4.2 mg via INTRAVENOUS
  Administered 2013-07-05: 08:00:00 via INTRAVENOUS
  Administered 2013-07-05: 4.6 mg via INTRAVENOUS
  Administered 2013-07-05: 3.2 mg via INTRAVENOUS
  Administered 2013-07-06: 0.3 mg via INTRAVENOUS
  Administered 2013-07-06: 21:00:00 via INTRAVENOUS
  Administered 2013-07-06: 3 mg via INTRAVENOUS
  Administered 2013-07-06: 04:00:00 via INTRAVENOUS
  Administered 2013-07-06: 3 mg via INTRAVENOUS
  Administered 2013-07-06: 2.4 mg via INTRAVENOUS
  Administered 2013-07-06: 3.9 mg via INTRAVENOUS
  Administered 2013-07-06: 14:00:00 via INTRAVENOUS
  Administered 2013-07-06: 0.9 mg via INTRAVENOUS
  Administered 2013-07-06: 3.3 mg via INTRAVENOUS
  Administered 2013-07-07: 4.8 mg via INTRAVENOUS
  Administered 2013-07-07: 1.2 mg via INTRAVENOUS
  Administered 2013-07-07 (×2): via INTRAVENOUS
  Administered 2013-07-07: 0.9 mg via INTRAVENOUS
  Administered 2013-07-07: 6 mg via INTRAVENOUS
  Administered 2013-07-07: 4.5 mg via INTRAVENOUS
  Administered 2013-07-08: 3 mg via INTRAVENOUS
  Administered 2013-07-08: 02:00:00 via INTRAVENOUS
  Administered 2013-07-08: 4.5 mg via INTRAVENOUS
  Administered 2013-07-08: 09:00:00 via INTRAVENOUS
  Administered 2013-07-08: 13.67 mL via INTRAVENOUS
  Administered 2013-07-08: 48.9 mg via INTRAVENOUS
  Administered 2013-07-08: 16:00:00 via INTRAVENOUS
  Administered 2013-07-08: 5.64 mg via INTRAVENOUS
  Administered 2013-07-08: 8.7 mg via INTRAVENOUS
  Administered 2013-07-09: 7.8 mg via INTRAVENOUS
  Administered 2013-07-09: 07:00:00 via INTRAVENOUS
  Administered 2013-07-09: 4.8 mg via INTRAVENOUS
  Administered 2013-07-09: 7.8 mg via INTRAVENOUS
  Administered 2013-07-09 – 2013-07-10 (×3): via INTRAVENOUS
  Administered 2013-07-10: 4.39 mg via INTRAVENOUS
  Administered 2013-07-10: 4.8 mg via INTRAVENOUS
  Administered 2013-07-10: 2.4 mg via INTRAVENOUS
  Administered 2013-07-10: 5.1 mg via INTRAVENOUS
  Administered 2013-07-10: 12:00:00 via INTRAVENOUS
  Filled 2013-07-02 (×24): qty 25

## 2013-07-02 MED ORDER — DIPHENHYDRAMINE HCL 50 MG/ML IJ SOLN
12.5000 mg | Freq: Four times a day (QID) | INTRAMUSCULAR | Status: DC | PRN
Start: 2013-07-02 — End: 2013-07-02

## 2013-07-02 MED ORDER — DIPHENHYDRAMINE HCL 12.5 MG/5ML PO ELIX: 12.5000 mg | ORAL_SOLUTION | Freq: Four times a day (QID) | ORAL | Status: DC | PRN

## 2013-07-02 MED ORDER — ONDANSETRON HCL 4 MG/2ML IJ SOLN: 4.0000 mg | Freq: Four times a day (QID) | INTRAMUSCULAR | Status: DC | PRN

## 2013-07-02 MED ORDER — DIPHENHYDRAMINE HCL 50 MG/ML IJ SOLN
12.5000 mg | Freq: Four times a day (QID) | INTRAMUSCULAR | Status: DC | PRN
  Administered 2013-07-02: 12.5 mg via INTRAVENOUS
  Filled 2013-07-02: qty 1

## 2013-07-02 MED ORDER — DIPHENHYDRAMINE HCL 12.5 MG/5ML PO ELIX
12.5000 mg | ORAL_SOLUTION | Freq: Four times a day (QID) | ORAL | Status: DC | PRN
Start: 2013-07-02 — End: 2013-07-02

## 2013-07-02 MED ORDER — NALOXONE HCL 0.4 MG/ML IJ SOLN
0.4000 mg | INTRAMUSCULAR | Status: DC | PRN
Start: 2013-07-02 — End: 2013-07-02

## 2013-07-02 NOTE — Progress Notes (Signed)
Patient ID: [name], female   DOB: [birth year], 63 y.o.   MRN: [id number] [facility] Surgery Progress Note:   3 Days Post-Op  Subjective: Mental status is clear.  Pain is midline and not totally addressed with IV PCA on full dose.   Objective: Vital signs in last 24 hours: Temp:  [98.9 F (37.2 C)-100 F (37.8 C)] 99.1 F (37.3 C) (01/09 0600) Pulse Rate:  [76-88] 76 (01/09 0600) Resp:  [14-22] 17 (01/09 0756) BP: (100-116)/(44-74) 100/64 mmHg (01/09 0600) SpO2:  [94 %-100 %] 98 % (01/09 0756) FiO2 (%):  [39 %-46 %] 45 % (01/09 0315)  Intake/Output from previous day: 01/08 0701 - 01/09 0700 In: 2925.7 [I.V.:2400; NG/GT:525.7] Out: 3650 [Urine:3650] Intake/Output this shift: Total I/O In: -  Out: 375 [Urine:375]  Physical Exam: Work of breathing is not labored.  Tube feedings at 50/hr.  Doing better.  Will allow ice chips.    Lab Results:  Results for orders placed during the hospital encounter of 06/29/13 (from the past 48 hour(s))  CBC     Status: Abnormal   Collection Time    07/01/13  5:20 AM      Result Value Range   WBC 9.1  4.0 - 10.5 K/uL   RBC 3.19 (*) 3.87 - 5.11 MIL/uL   Hemoglobin 9.3 (*) 12.0 - 15.0 g/dL   HCT 30.4 (*) 36.0 - 46.0 %   MCV 95.3  78.0 - 100.0 fL   MCH 29.2  26.0 - 34.0 pg   MCHC 30.6  30.0 - 36.0 g/dL   RDW 14.8  11.5 - 15.5 %   Platelets 275  150 - 400 K/uL  BASIC METABOLIC PANEL     Status: Abnormal   Collection Time    07/01/13  5:20 AM      Result Value Range   Sodium 139  137 - 147 mEq/L   Potassium 4.6  3.7 - 5.3 mEq/L   Chloride 104  96 - 112 mEq/L   CO2 26  19 - 32 mEq/L   Glucose, Bld 113 (*) 70 - 99 mg/dL   BUN 7  6 - 23 mg/dL   Creatinine, Ser 0.64  0.50 - 1.10 mg/dL   Calcium 8.4  8.4 - 10.5 mg/dL   GFR calc non Af Amer >90  >90 mL/min   GFR calc Af Amer >90  >90 mL/min   Comment: (NOTE)     The eGFR has been calculated using the CKD EPI equation.     This calculation has not been validated in all clinical  situations.     eGFR's persistently <90 mL/min signify possible Chronic Kidney     Disease.    Radiology/Results: No results found.  Anti-infectives: Anti-infectives   Start     Dose/Rate Route Frequency Ordered Stop   06/29/13 1800  cefOXitin (MEFOXIN) 1 g in dextrose 5 % 50 mL IVPB     1 g 100 mL/hr over 30 Minutes Intravenous 4 times per day 06/29/13 1752 06/29/13 1829   06/29/13 1145  cefOXitin (MEFOXIN) 2 g in dextrose 5 % 50 mL IVPB     2 g 100 mL/hr over 30 Minutes Intravenous On call to O.R. 06/29/13 1127 06/29/13 1450      Assessment/Plan: Problem List: Patient Active Problem List   Diagnosis Date Noted  . 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 Moderate malnutrition 05/13/2013  . Lap Nissen + truncal vagotomy July 2008 05/13/2013  . COPD (  chronic obstructive pulmonary disease) 11/07/2010  . Pneumonia, organism unspecified 11/07/2010  . DEPRESSION 06/06/2010  . LUNG NODULE 03/15/2010  . 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 FEVER, RECURRENT 08/11/2009  . WEIGHT LOSS 08/11/2009  . LEG EDEMA 01/19/2008  . GASTRIC OUTLET OBSTRUCTION 08/28/2007  . CIGARETTE SMOKER 08/24/2007  . GASTROPARESIS 06/03/2007  . PEPTIC ULCER DISEASE 04/30/2007  . SYMPTOM, PAIN, ABDOMINAL, GENERALIZED 03/16/2007  . ANXIETY 01/07/2007  . MENOPAUSE 01/07/2007  . BARRETT'S ESOPHAGUS, HX OF 07/03/2005  . ACUTE DUODEN ULCER W/HEMORR PERF&OBSTRUCTION 06/26/2004    Doing well thus far from subtotal gastrectomy 3 Days Post-Op    LOS: 3 days   [name], MD, [facility] [phone] beeper [phone]  07/02/2013 8:33 AM

## 2013-07-02 NOTE — Progress Notes (Addendum)
NUTRITION FOLLOW UP  Intervention:   - Recommend continuing TF of Jevity 1.2 at 62m/hr - Diet advancement per MD - Antiemetics per MD, noted Zofran and Phenergan currently PRN, pt may benefit from scheduled antiemetics as pt c/o ongoing nausea  - Will continue to monitor   Nutrition Dx:   Inadequate oral intake related to inability to eat as evidenced by NPO - ongoing   Goal:   TF to meet >90% of estimated nutritional needs - met  Monitor:   Weights, labs, diet advancement, TF tolerance, BM  Assessment:   Admitted with gastric outlet obstruction, had gastrectomy and GJ tube placement yesterday. Pt was started on trickle TF of Jevity 1.2 at 231mhr last night.   1/7 - Met with pt who reports having NGT placed last month for TF however was only on TF for 2-3 days before tube got clogged. Reports she has had stomach pain since the 1990s and had nausea for a long time. Reports she has been eating a lot of iron rich foods recently to give her more energy and typical daily intake includes tuna, spaghetti, and Ensure. Reports she was eating all the time. Pt does not have any teeth. Reports she was weighing 90 pounds recently, now at 111 pounds.   1/9 - Pt getting TF of Jevity 1.2 at 506mr (provides 1440 calories, 67g protein, 968m18mee water meeting 100% of estimated nutritional needs). Reports having nausea but does not think this is related to the TF as she has had chronic nausea even before feeding tube placed. She states she is not interested in trying another TF formula. Last BM 1/5.   Height: Ht Readings from Last 1 Encounters:  06/29/13 _0  (1.6 m)    Weight Status:   Wt Readings from Last 1 Encounters:  06/30/13 111 lb 5.3 oz (50.5 kg)  Admit wt:        105 lb 13.1 oz (48 kg)  Net I/Os: +3.1L  Re-estimated needs:  Kcal: 1400-1600  Protein: 60-75g  Fluid: 1.4-1.6L/day   Skin: Abdominal incision    Diet Order: NPO   Intake/Output Summary (Last 24 hours) at 07/02/13  1004 Last data filed at 07/02/13 0804  Gross per 24 hour  Intake   2400 ml  Output   3725 ml  Net  -1325 ml    Last BM: 1/5   Labs:   Recent Labs Lab 06/29/13 2043 06/30/13 0335 07/01/13 0520  NA  --  134* 139  K  --  4.5 4.6  CL  --  102 104  CO2  --  21 26  BUN  --  9 7  CREATININE 0.65 0.59 0.64  CALCIUM  --  8.2* 8.4  GLUCOSE  --  241* 113*    CBG (last 3)  No results found for this basename: GLUCAP,  in the last 72 hours  Scheduled Meds: . antiseptic oral rinse  15 mL Mouth Rinse q12n4p  . chlorhexidine  15 mL Mouth Rinse BID  . feeding supplement (JEVITY 1.2 CAL)  1,000 mL Per Tube Q24H  . heparin  5,000 Units Subcutaneous Q8H  . nicotine  21 mg Transdermal Daily    Continuous Infusions: . dextrose 5 % and 0.45 % NaCl with KCl 20 mEq/L 100 mL/hr at 07/02/13 [address], LDN [phone] Hours Pager

## 2013-07-02 NOTE — Progress Notes (Signed)
Paged dr. Hassell Done- returned call.  He requests for patient to remain on Dilaudid full dose PCA.  Orders re-entered.

## 2013-07-02 NOTE — Progress Notes (Signed)
Dr. Hassell Done,  [name] has a case of Osmolyte 1.2 at home from [facility]. She will now use Genteva. Can [name] use her Osmolyte to save money? I checked this out w/ [employer], the nutritionist and she said they both have the same fat and calories. The only difference is that jevity has more fiber.  Thanks!   Last edited by Redge Gainer, RN on 07/02/13 at [phone]

## 2013-07-03 MED ORDER — HYDROMORPHONE HCL PF 1 MG/ML IJ SOLN
0.5000 mg | INTRAMUSCULAR | Status: DC | PRN
  Administered 2013-07-03 – 2013-07-05 (×12): 1 mg via INTRAVENOUS
  Filled 2013-07-03 (×12): qty 1

## 2013-07-03 MED ORDER — JEVITY 1.2 CAL PO LIQD
1000.0000 mL | ORAL | Status: DC
Start: 2013-07-03 — End: 2013-07-04
  Filled 2013-07-03 (×2): qty 1000

## 2013-07-03 NOTE — Progress Notes (Signed)
Unable to flush feeding tube. Kangaroo pump unable to pump feeding through clotted tube. Tried to clear tube by adding coke and pulling back and forth on the syringe. Another nurse assisted me and she was also unable to clear the tube. Notified MD on call, ordered to stop feeding for now.

## 2013-07-03 NOTE — Progress Notes (Signed)
Patient ID: [name], female   DOB: 1951/03/04, 63 y.o.   MRN: [id number] [facility] Surgery Progress Note:   4 Days Post-Op  Subjective: Mental status is clear.  Wants more pain meds to walk Objective: Vital signs in last 24 hours: Temp:  [98.5 F (36.9 C)-100 F (37.8 C)] 98.5 F (36.9 C) (01/10 0600) Pulse Rate:  [72-81] 75 (01/10 0600) Resp:  [13-18] 15 (01/10 0600) BP: (90-115)/(57-72) 90/57 mmHg (01/10 0600) SpO2:  [91 %-99 %] 92 % (01/10 0600)  Intake/Output from previous day: 01/09 0701 - 01/10 0700 In: 2193.3 [I.V.:1193.3; NG/GT:1000] Out: 4425 [Urine:4425] Intake/Output this shift: Total I/O In: -  Out: 250 [Urine:250]  Physical Exam: Work of breathing is normal for her.  Not labored.  Having incisional pain.    Lab Results:  No results found for this or any previous visit (from the past 48 hour(s)).  Radiology/Results: No results found.  Anti-infectives: Anti-infectives   Start     Dose/Rate Route Frequency Ordered Stop   06/29/13 1800  cefOXitin (MEFOXIN) 1 g in dextrose 5 % 50 mL IVPB     1 g 100 mL/hr over 30 Minutes Intravenous 4 times per day 06/29/13 1752 06/29/13 1829   06/29/13 1145  cefOXitin (MEFOXIN) 2 g in dextrose 5 % 50 mL IVPB     2 g 100 mL/hr over 30 Minutes Intravenous On call to O.R. 06/29/13 1127 06/29/13 1450      Assessment/Plan: Problem List: Patient Active Problem List   Diagnosis Date Noted  . 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 Moderate malnutrition 05/13/2013  . Lap Nissen + truncal vagotomy July 2008 05/13/2013  . COPD (chronic obstructive pulmonary disease) 11/07/2010  . Pneumonia, organism unspecified 11/07/2010  . DEPRESSION 06/06/2010  . LUNG NODULE 03/15/2010  . 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 FEVER, RECURRENT 08/11/2009  . WEIGHT LOSS 08/11/2009  . LEG EDEMA 01/19/2008  . GASTRIC OUTLET OBSTRUCTION 08/28/2007  . CIGARETTE SMOKER  08/24/2007  . GASTROPARESIS 06/03/2007  . PEPTIC ULCER DISEASE 04/30/2007  . SYMPTOM, PAIN, ABDOMINAL, GENERALIZED 03/16/2007  . ANXIETY 01/07/2007  . MENOPAUSE 01/07/2007  . BARRETT'S ESOPHAGUS, HX OF 07/03/2005  . ACUTE DUODEN ULCER W/HEMORR PERF&OBSTRUCTION 06/26/2004    Receiving 1400 K cal on Jevity.  May increase a little.  Check labs in the am.   4 Days Post-Op    LOS: 4 days   [name], MD, [facility] [phone] beeper [phone]  07/03/2013 9:28 AM

## 2013-07-04 NOTE — Progress Notes (Signed)
Patient ID: [name], female   DOB: July 12, 1950, 63 y.o.   MRN: [id number] [facility] Progress Note:   5 Days Post-Op  Subjective: Mental status is clear Objective: Vital signs in last 24 hours: Temp:  [97.9 F (36.6 C)-99.6 F (37.6 C)] 98.4 F (36.9 C) (01/11 0932) Pulse Rate:  [66-78] 67 (01/11 0932) Resp:  [13-18] 18 (01/11 0932) BP: (81-113)/(48-69) 99/63 mmHg (01/11 0932) SpO2:  [91 %-98 %] 97 % (01/11 0932) FiO2 (%):  [44 %] 44 % (01/11 0800)  Intake/Output from previous day: 01/10 0701 - 01/11 0700 In: 5006.7 [I.V.:3606.7; NG/GT:1400] Out: 3025 [Urine:3025] Intake/Output this shift:    Physical Exam: Work of breathing is normal.  Incisions OK.  Jejunostomy tube occluded with Jevity.  Multiple attempts to unclog unsuccessful.   Lab Results:  No results found for this or any previous visit (from the past 48 hour(s)).  Radiology/Results: No results found.  Anti-infectives: Anti-infectives   Start     Dose/Rate Route Frequency Ordered Stop   06/29/13 1800  cefOXitin (MEFOXIN) 1 g in dextrose 5 % 50 mL IVPB     1 g 100 mL/hr over 30 Minutes Intravenous 4 times per day 06/29/13 1752 06/29/13 1829   06/29/13 1145  cefOXitin (MEFOXIN) 2 g in dextrose 5 % 50 mL IVPB     2 g 100 mL/hr over 30 Minutes Intravenous On call to O.R. 06/29/13 1127 06/29/13 1450      Assessment/Plan: Problem List: Patient Active Problem List   Diagnosis Date Noted  . 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 Moderate malnutrition 05/13/2013  . Lap Nissen + truncal vagotomy July 2008 05/13/2013  . COPD (chronic obstructive pulmonary disease) 11/07/2010  . Pneumonia, organism unspecified 11/07/2010  . DEPRESSION 06/06/2010  . LUNG NODULE 03/15/2010  . 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 FEVER, RECURRENT 08/11/2009  . WEIGHT LOSS 08/11/2009  . LEG EDEMA 01/19/2008  . GASTRIC OUTLET OBSTRUCTION 08/28/2007  .  CIGARETTE SMOKER 08/24/2007  . GASTROPARESIS 06/03/2007  . PEPTIC ULCER DISEASE 04/30/2007  . SYMPTOM, PAIN, ABDOMINAL, GENERALIZED 03/16/2007  . ANXIETY 01/07/2007  . MENOPAUSE 01/07/2007  . BARRETT'S ESOPHAGUS, HX OF 07/03/2005  . ACUTE DUODEN ULCER W/HEMORR PERF&OBSTRUCTION 06/26/2004    Check UGI tomorrow before feeding.  5 Days Post-Op    LOS: 5 days   [name], MD, [facility] [phone] beeper [phone]  07/04/2013 10:05 AM

## 2013-07-05 ENCOUNTER — Inpatient Hospital Stay (HOSPITAL_COMMUNITY): Payer: Medicare Other

## 2013-07-05 MED ORDER — IOHEXOL 300 MG/ML  SOLN
50.0000 mL | Freq: Once | INTRAMUSCULAR | Status: AC | PRN
  Administered 2013-07-05: 50 mL

## 2013-07-05 NOTE — Progress Notes (Signed)
NUTRITION FOLLOW UP  Intervention:   - Recommend, once feeding tube unclogged, changing TF to Osmolite 1.2 with goal rate of 55ml/hr. This will provide 1584 calories, 73g protein, and 1065ml free water and meet 100% of estimated nutritional needs. If IVF d/c, recommend 130ml water flushes 4 times/day.  - Diet advancement per MD - Recommend bowel regimen per MD as last BM documented was 1/5  - Will continue to monitor   Nutrition Dx:   Inadequate oral intake related to inability to eat as evidenced by NPO - ongoing   Goal:   TF to meet >90% of estimated nutritional needs - not met, TF on hold due to clog  Monitor:   Weights, labs, diet advancement, resuming TF, BM  Assessment:   Admitted with gastric outlet obstruction, had gastrectomy and GJ tube placement yesterday. Pt was started on trickle TF of Jevity 1.2 at 20ml/hr last night.   1/7 - Met with pt who reports having NGT placed last month for TF however was only on TF for 2-3 days before tube got clogged. Reports she has had stomach pain since the 1990s and had nausea for a long time. Reports she has been eating a lot of iron rich foods recently to give her more energy and typical daily intake includes tuna, spaghetti, and Ensure. Reports she was eating all the time. Pt does not have any teeth. Reports she was weighing 90 pounds recently, now at 111 pounds.   1/9 - Pt getting TF of Jevity 1.2 at 50ml/hr (provides 1440 calories, 67g protein, 968ml free water meeting 100% of estimated nutritional needs). Reports having nausea but does not think this is related to the TF as she has had chronic nausea even before feeding tube placed. She states she is not interested in trying another TF formula. Last BM 1/5. Spoke with RN about changing TF formula and recommended switching to Osmolite 1.2 as appropriate for cost savings as pt already has this product at home.   1/12 - Reviewed events since last RD visit. Feeding tube clogged 1/10.    Height: Ht Readings from Last 1 Encounters:  06/29/13 5' 3" (1.6 m)    Weight Status:   Wt Readings from Last 1 Encounters:  06/30/13 111 lb 5.3 oz (50.5 kg)  Admit wt:        105 lb 13.1 oz (48 kg)  Net I/Os: +1.9L  Re-estimated needs:  Kcal: 1400-1600  Protein: 60-75g  Fluid: 1.4-1.6L/day   Skin: Abdominal incision    Diet Order: NPO   Intake/Output Summary (Last 24 hours) at 07/05/13 1526 Last data filed at 07/05/13 1405  Gross per 24 hour  Intake 2468.33 ml  Output   3450 ml  Net -981.67 ml    Last BM: 1/5   Labs:   Recent Labs Lab 06/29/13 2043 06/30/13 0335 07/01/13 0520  NA  --  134* 139  K  --  4.5 4.6  CL  --  102 104  CO2  --  21 26  BUN  --  9 7  CREATININE 0.65 0.59 0.64  CALCIUM  --  8.2* 8.4  GLUCOSE  --  241* 113*    CBG (last 3)  No results found for this basename: GLUCAP,  in the last 72 hours  Scheduled Meds: . antiseptic oral rinse  15 mL Mouth Rinse q12n4p  . chlorhexidine  15 mL Mouth Rinse BID  . heparin  5,000 Units Subcutaneous Q8H  . HYDROmorphone PCA 0.3 mg/mL     Intravenous Q4H  . nicotine  21 mg Transdermal Daily    Continuous Infusions: . dextrose 5 % and 0.45 % NaCl with KCl 20 mEq/L 100 mL/hr at 07/05/13 0725      Baron MS, RD, LDN [phone] Pager [phone] After Hours Pager  

## 2013-07-05 NOTE — Progress Notes (Signed)
Patient ID: [name], female   DOB: [birth year], 63 y.o.   MRN: [id number] [facility] Surgery Progress Note:   6 Days Post-Op  Subjective: Mental status is clear.   Objective: Vital signs in last 24 hours: Temp:  [98.1 F (36.7 C)-99.5 F (37.5 C)] 98.1 F (36.7 C) (01/12 0600) Pulse Rate:  [66-73] 67 (01/12 0600) Resp:  [13-18] 14 (01/12 0724) BP: (87-110)/(54-69) 110/69 mmHg (01/12 0600) SpO2:  [95 %-99 %] 99 % (01/12 0724)  Intake/Output from previous day: 01/11 0701 - 01/12 0700 In: 2550 [P.O.:150; I.V.:2400] Out: 4967 [Urine:4050] Intake/Output this shift:    Physical Exam: Work of breathing is normal.  Jejunostomy tube is clogged  Lab Results:  No results found for this or any previous visit (from the past 48 hour(s)).  Radiology/Results: No results found.  Anti-infectives: Anti-infectives   Start     Dose/Rate Route Frequency Ordered Stop   06/29/13 1800  cefOXitin (MEFOXIN) 1 g in dextrose 5 % 50 mL IVPB     1 g 100 mL/hr over 30 Minutes Intravenous 4 times per day 06/29/13 1752 06/29/13 1829   06/29/13 1145  cefOXitin (MEFOXIN) 2 g in dextrose 5 % 50 mL IVPB     2 g 100 mL/hr over 30 Minutes Intravenous On call to O.R. 06/29/13 1127 06/29/13 1450      Assessment/Plan: Problem List: Patient Active Problem List   Diagnosis Date Noted  . 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 Moderate malnutrition 05/13/2013  . Lap Nissen + truncal vagotomy July 2008 05/13/2013  . COPD (chronic obstructive pulmonary disease) 11/07/2010  . Pneumonia, organism unspecified 11/07/2010  . DEPRESSION 06/06/2010  . LUNG NODULE 03/15/2010  . 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 FEVER, RECURRENT 08/11/2009  . WEIGHT LOSS 08/11/2009  . LEG EDEMA 01/19/2008  . GASTRIC OUTLET OBSTRUCTION 08/28/2007  . CIGARETTE SMOKER 08/24/2007  . GASTROPARESIS 06/03/2007  . PEPTIC ULCER DISEASE 04/30/2007  . SYMPTOM,  PAIN, ABDOMINAL, GENERALIZED 03/16/2007  . ANXIETY 01/07/2007  . MENOPAUSE 01/07/2007  . BARRETT'S ESOPHAGUS, HX OF 07/03/2005  . ACUTE DUODEN ULCER W/HEMORR PERF&OBSTRUCTION 06/26/2004    Awaiting ugi.  If OK will start clear liquid diet 6 Days Post-Op    LOS: 6 days   [name], MD, [facility] [phone] beeper [phone]  07/05/2013 8:25 AM

## 2013-07-06 NOTE — Care Management Note (Signed)
    Page 1 of 2   07/06/2013     12:21:17 PM   CARE MANAGEMENT NOTE 07/06/2013  Patient:  [name], [name]   Account Number:  [id number]  Date Initiated:  06/30/2013  Documentation initiated by:  DAVIS,[name]  Subjective/Objective Assessment:   pt admitted to sdu post op due to low o2 and hypotension. Iv fkd challenge and o2 given.     Action/Plan:   lives independently at home , home is shared with friends.   Anticipated DC Date:  07/09/2013   Anticipated DC Plan:  [place]  In-house referral  NA      DC Planning Services  NA      [facility] Choice  NA   Choice offered to / List presented to:  NA   DME arranged  NA      DME agency  NA     [facility] arranged  HH-10 DISEASE MANAGEMENT  HH-1 RN      [place]   Status of service:  Completed, signed off Medicare Important Message given?  NA - LOS <3 / Initial given by admissions (If response is "NO", the following Medicare IM given date fields will be blank) Date Medicare IM given:   Date Additional Medicare IM given:    Discharge Disposition:  [place]  Per UR Regulation:  Reviewed for med. necessity/level of care/duration of stay  If discussed at [place] of Stay Meetings, dates discussed:    Comments:  07-05-13 [place] with patient at bedside, has a history of [place] services with [facility], wants to change companies. Provided patient with a list for choice. Merleen Nicely [facility] for assistance with tube feedings. Contacted [facility] and they will follow for d/c needs. Awaiting final orders.  [id number] Rosana Hoes, RN, BSN, CCM [phone] Chart Reviewed for discharge and hospital needs. Discharge needs at time of review:  None present will follow for needs. Review of patient progress due on [id number].

## 2013-07-06 NOTE — Progress Notes (Signed)
Patient ID: [name], female   DOB: April 14, 1951, 63 y.o.   MRN: [id number] [facility] Surgery Progress Note:   7 Days Post-Op  Subjective: Mental status is clear.   Objective: Vital signs in last 24 hours: Temp:  [98.5 F (36.9 C)-99.1 F (37.3 C)] 98.5 F (36.9 C) (01/13 0501) Pulse Rate:  [64-114] 64 (01/13 0501) Resp:  [14-20] 16 (01/13 0745) BP: (96-122)/(59-74) 104/64 mmHg (01/13 0501) SpO2:  [83 %-100 %] 100 % (01/13 0745)  Intake/Output from previous day: 01/12 0701 - 01/13 0700 In: 2400 [I.V.:2400] Out: 2350 [Urine:2350] Intake/Output this shift: Total I/O In: 430 [I.V.:430] Out: 500 [Urine:500]  Physical Exam: Work of breathing is normal.  No abdominal pain  Lab Results:  No results found for this or any previous visit (from the past 48 hour(s)).  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  EXAM: WATER SOLUBLE UPPER GI SERIES  TECHNIQUE: Single-column upper GI series was performed using water soluble contrast.  CONTRAST:  50 cc Omnipaque 300.  COMPARISON:  05/25/2013 and 09/17/2012.  FLUOROSCOPY TIME:  2 min and 59 seconds.  FINDINGS: Post subtotal gastrectomy. Slight delayed of transit of contrast through gastric -small bowel anastomosis.  No leak identified.  Surgical drain is in place.  IMPRESSION: Post subtotal gastrectomy. Slight delayed of transit of contrast through gastric -small bowel anastomosis.  No leak identified.  This is a call report.   Electronically Signed   By: Chauncey Cruel M.D.   On: 07/05/2013 10:09    Anti-infectives: Anti-infectives   Start     Dose/Rate Route Frequency Ordered Stop   06/29/13 1800  cefOXitin (MEFOXIN) 1 g in dextrose 5 % 50 mL IVPB     1 g 100 mL/hr over 30 Minutes Intravenous 4 times per day 06/29/13 1752 06/29/13 1829   06/29/13 1145  cefOXitin (MEFOXIN) 2 g in dextrose 5 % 50 mL IVPB     2 g 100 mL/hr over 30 Minutes Intravenous On call  to O.R. 06/29/13 1127 06/29/13 1450      Assessment/Plan: Problem List: Patient Active Problem List   Diagnosis Date Noted  . 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 Moderate malnutrition 05/13/2013  . Lap Nissen + truncal vagotomy July 2008 05/13/2013  . COPD (chronic obstructive pulmonary disease) 11/07/2010  . Pneumonia, organism unspecified 11/07/2010  . DEPRESSION 06/06/2010  . LUNG NODULE 03/15/2010  . 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 FEVER, RECURRENT 08/11/2009  . WEIGHT LOSS 08/11/2009  . LEG EDEMA 01/19/2008  . GASTRIC OUTLET OBSTRUCTION 08/28/2007  . CIGARETTE SMOKER 08/24/2007  . GASTROPARESIS 06/03/2007  . PEPTIC ULCER DISEASE 04/30/2007  . SYMPTOM, PAIN, ABDOMINAL, GENERALIZED 03/16/2007  . ANXIETY 01/07/2007  . MENOPAUSE 01/07/2007  . BARRETT'S ESOPHAGUS, HX OF 07/03/2005  . ACUTE DUODEN ULCER W/HEMORR PERF&OBSTRUCTION 06/26/2004    UGI ok.  Began clear liquids PO.   7 Days Post-Op    LOS: 7 days   [name], MD, [facility] [phone] beeper [phone]  07/06/2013 11:15 AM

## 2013-07-07 NOTE — Progress Notes (Signed)
Patient ID: [name], female   DOB: 1951-06-08, 63 y.o.   MRN: [id number] [facility] Surgery Progress Note:   8 Days Post-Op  Subjective: Mental status is clear.  Max dialudid-drug tolerance Objective: Vital signs in last 24 hours: Temp:  [98.6 F (37 C)-99.2 F (37.3 C)] 98.6 F (37 C) (01/14 0629) Pulse Rate:  [62-72] 62 (01/14 0629) Resp:  [16-18] 18 (01/14 0731) BP: (94-109)/(60-66) 109/64 mmHg (01/14 0629) SpO2:  [90 %-100 %] 94 % (01/14 0731)  Intake/Output from previous day: 01/13 0701 - 01/14 0700 In: 2880 [P.O.:480; I.V.:2400] Out: 3600 [Urine:3600] Intake/Output this shift:    Physical Exam: Work of breathing is not labored.  Will try to advance to full liquids.    Lab Results:  No results found for this or any previous visit (from the past 48 hour(s)).  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  EXAM: WATER SOLUBLE UPPER GI SERIES  TECHNIQUE: Single-column upper GI series was performed using water soluble contrast.  CONTRAST:  50 cc Omnipaque 300.  COMPARISON:  05/25/2013 and 09/17/2012.  FLUOROSCOPY TIME:  2 min and 59 seconds.  FINDINGS: Post subtotal gastrectomy. Slight delayed of transit of contrast through gastric -small bowel anastomosis.  No leak identified.  Surgical drain is in place.  IMPRESSION: Post subtotal gastrectomy. Slight delayed of transit of contrast through gastric -small bowel anastomosis.  No leak identified.  This is a call report.   Electronically Signed   By: Chauncey Cruel M.D.   On: 07/05/2013 10:09    Anti-infectives: Anti-infectives   Start     Dose/Rate Route Frequency Ordered Stop   06/29/13 1800  cefOXitin (MEFOXIN) 1 g in dextrose 5 % 50 mL IVPB     1 g 100 mL/hr over 30 Minutes Intravenous 4 times per day 06/29/13 1752 06/29/13 1829   06/29/13 1145  cefOXitin (MEFOXIN) 2 g in dextrose 5 % 50 mL IVPB     2 g 100 mL/hr over 30 Minutes Intravenous On  call to O.R. 06/29/13 1127 06/29/13 1450      Assessment/Plan: Problem List: Patient Active Problem List   Diagnosis Date Noted  . 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 Moderate malnutrition 05/13/2013  . Lap Nissen + truncal vagotomy July 2008 05/13/2013  . COPD (chronic obstructive pulmonary disease) 11/07/2010  . Pneumonia, organism unspecified 11/07/2010  . DEPRESSION 06/06/2010  . LUNG NODULE 03/15/2010  . 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 FEVER, RECURRENT 08/11/2009  . WEIGHT LOSS 08/11/2009  . LEG EDEMA 01/19/2008  . GASTRIC OUTLET OBSTRUCTION 08/28/2007  . CIGARETTE SMOKER 08/24/2007  . GASTROPARESIS 06/03/2007  . PEPTIC ULCER DISEASE 04/30/2007  . SYMPTOM, PAIN, ABDOMINAL, GENERALIZED 03/16/2007  . ANXIETY 01/07/2007  . MENOPAUSE 01/07/2007  . BARRETT'S ESOPHAGUS, HX OF 07/03/2005  . ACUTE DUODEN ULCER W/HEMORR PERF&OBSTRUCTION 06/26/2004    Slow progress.  Feeding tube obstructed.  Taking clears advance to full liquids.   8 Days Post-Op    LOS: 8 days   [name], MD, [facility] [phone] beeper [phone]  07/07/2013 7:41 AM

## 2013-07-08 MED ORDER — ENSURE COMPLETE PO LIQD
237.0000 mL | Freq: Three times a day (TID) | ORAL | Status: DC
  Administered 2013-07-08 – 2013-07-09 (×3): 237 mL via ORAL

## 2013-07-08 NOTE — Progress Notes (Signed)
NUTRITION FOLLOW UP  Intervention:   - Ensure Complete TID - Encouraged increased meal intake  - Recommend bowel regimen per MD as last BM documented was 1/5  - Will continue to monitor   Nutrition Dx:   Inadequate oral intake related to inability to eat as evidenced by NPO - ongoing but now related to stomach pain, not liking the food as evidenced by <25% meal intake  Goal:   TF to meet >90% of estimated nutritional needs - not met, TF on hold due to clog  New goal: Pt to consume >90% of meals/supplements  Monitor:   Weights, labs, intake, resuming TF, BM  Assessment:   Admitted with gastric outlet obstruction, had gastrectomy and GJ tube placement yesterday. Pt was started on trickle TF of Jevity 1.2 at 15m/hr last night.   1/7 - Met with pt who reports having NGT placed last month for TF however was only on TF for 2-3 days before tube got clogged. Reports she has had stomach pain since the 1990s and had nausea for a long time. Reports she has been eating a lot of iron rich foods recently to give her more energy and typical daily intake includes tuna, spaghetti, and Ensure. Reports she was eating all the time. Pt does not have any teeth. Reports she was weighing 90 pounds recently, now at 111 pounds.   1/9 - Pt getting TF of Jevity 1.2 at 566mhr (provides 1440 calories, 67g protein, 9688mree water meeting 100% of estimated nutritional needs). Reports having nausea but does not think this is related to the TF as she has had chronic nausea even before feeding tube placed. She states she is not interested in trying another TF formula. Last BM 1/5. Spoke with RN about changing TF formula and recommended switching to Osmolite 1.2 as appropriate for cost savings as pt already has this product at home.   1/12 - Reviewed events since last RD visit. Feeding tube clogged 1/10.   1/15 - Noted recent events. Pt started on clear liquids 1/13, full liquids 1/14, and dysphagia 3/thin today. Pt  reports not eating much today due to not liking the food, was working with kitchen to re-order meal to find something she liked. Feeding tube still clogged. Pt agreeable to getting Ensure Complete as pt with inadequate oral intake. Pt denies any nausea, only c/o stomach pain.    Height: Ht Readings from Last 1 Encounters:  06/29/13 '5\' 3"'  (1.6 m)    Weight Status:   Wt Readings from Last 1 Encounters:  06/30/13 111 lb 5.3 oz (50.5 kg)  Admit wt:        105 lb 13.1 oz (48 kg)  Net I/Os: +2.3L  Re-estimated needs:  Kcal: 1400-1600  Protein: 60-75g  Fluid: 1.4-1.6L/day   Skin: Abdominal incision    Diet Order: Dysphagia 3, thin   Intake/Output Summary (Last 24 hours) at 07/08/13 0949 Last data filed at 07/08/13 0555  Gross per 24 hour  Intake 3786.81 ml  Output   2600 ml  Net 1186.81 ml    Last BM: 1/5   Labs:  No results found for this basename: NA, K, CL, CO2, BUN, CREATININE, CALCIUM, MG, PHOS, GLUCOSE,  in the last 168 hours  CBG (last 3)  No results found for this basename: GLUCAP,  in the last 72 hours  Scheduled Meds: . antiseptic oral rinse  15 mL Mouth Rinse q12n4p  . chlorhexidine  15 mL Mouth Rinse BID  . heparin  5,000 Units Subcutaneous Q8H  . HYDROmorphone PCA 0.3 mg/mL   Intravenous Q4H  . nicotine  21 mg Transdermal Daily    Continuous Infusions: . dextrose 5 % and 0.45 % NaCl with KCl 20 mEq/L 100 mL/hr at 07/08/13 [place], RD, LDN [phone] Pager [phone] After Hours Pager

## 2013-07-08 NOTE — Progress Notes (Signed)
Patient ID: [name], female   DOB: 08-Apr-1951, 63 y.o.   MRN: [id number] [facility] Surgery Progress Note:   9 Days Post-Op  Subjective: Mental status is clear.   Objective: Vital signs in last 24 hours: Temp:  [98.1 F (36.7 C)-99.1 F (37.3 C)] 99.1 F (37.3 C) (01/15 0556) Pulse Rate:  [61-71] 61 (01/15 0556) Resp:  [16-18] 18 (01/15 0640) BP: (89-103)/(48-62) 92/57 mmHg (01/15 0637) SpO2:  [92 %-96 %] 92 % (01/15 0640)  Intake/Output from previous day: 01/14 0701 - 01/15 0700 In: 4006.8 [P.O.:1300; I.V.:2706.8] Out: 2800 [Urine:2800] Intake/Output this shift:    Physical Exam: Work of breathing is normal.  IV infiltrated.  Will advance diet  Lab Results:  No results found for this or any previous visit (from the past 48 hour(s)).  Radiology/Results: No results found.  Anti-infectives: Anti-infectives   Start     Dose/Rate Route Frequency Ordered Stop   06/29/13 1800  cefOXitin (MEFOXIN) 1 g in dextrose 5 % 50 mL IVPB     1 g 100 mL/hr over 30 Minutes Intravenous 4 times per day 06/29/13 1752 06/29/13 1829   06/29/13 1145  cefOXitin (MEFOXIN) 2 g in dextrose 5 % 50 mL IVPB     2 g 100 mL/hr over 30 Minutes Intravenous On call to O.R. 06/29/13 1127 06/29/13 1450      Assessment/Plan: Problem List: Patient Active Problem List   Diagnosis Date Noted  . 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 Moderate malnutrition 05/13/2013  . Lap Nissen + truncal vagotomy July 2008 05/13/2013  . COPD (chronic obstructive pulmonary disease) 11/07/2010  . Pneumonia, organism unspecified 11/07/2010  . DEPRESSION 06/06/2010  . LUNG NODULE 03/15/2010  . 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 FEVER, RECURRENT 08/11/2009  . WEIGHT LOSS 08/11/2009  . LEG EDEMA 01/19/2008  . GASTRIC OUTLET OBSTRUCTION 08/28/2007  . CIGARETTE SMOKER 08/24/2007  . GASTROPARESIS 06/03/2007  . PEPTIC ULCER DISEASE 04/30/2007  .  SYMPTOM, PAIN, ABDOMINAL, GENERALIZED 03/16/2007  . ANXIETY 01/07/2007  . MENOPAUSE 01/07/2007  . BARRETT'S ESOPHAGUS, HX OF 07/03/2005  . ACUTE DUODEN ULCER W/HEMORR PERF&OBSTRUCTION 06/26/2004    Hopeful discharge tomorrow 9 Days Post-Op    LOS: 9 days   [name], MD, [facility] [phone] beeper [phone]  07/08/2013 8:26 AM

## 2013-07-09 NOTE — Progress Notes (Signed)
Patient ID: [name], female   DOB: 10-Aug-1950, 63 y.o.   MRN: [id number] [facility] Progress Note:   10 Days Post-Op  Subjective: Mental status is clear.  Patient in denial about eating.   Objective: Vital signs in last 24 hours: Temp:  [97.9 F (36.6 C)-99.2 F (37.3 C)] 98.3 F (36.8 C) (01/16 0932) Pulse Rate:  [62-77] 65 (01/16 0932) Resp:  [12-18] 16 (01/16 1155) BP: (85-104)/(55-63) 93/58 mmHg (01/16 0932) SpO2:  [96 %-100 %] 96 % (01/16 1155)  Intake/Output from previous day: 01/15 0701 - 01/16 0700 In: 1240 [P.O.:240; I.V.:1000] Out: 3900 [Urine:3900] Intake/Output this shift: Total I/O In: 240 [P.O.:240] Out: 700 [Urine:700]  Physical Exam: Work of breathing is normal  Lab Results:  No results found for this or any previous visit (from the past 48 hour(s)).  Radiology/Results: No results found.  Anti-infectives: Anti-infectives   Start     Dose/Rate Route Frequency Ordered Stop   06/29/13 1800  cefOXitin (MEFOXIN) 1 g in dextrose 5 % 50 mL IVPB     1 g 100 mL/hr over 30 Minutes Intravenous 4 times per day 06/29/13 1752 06/29/13 1829   06/29/13 1145  cefOXitin (MEFOXIN) 2 g in dextrose 5 % 50 mL IVPB     2 g 100 mL/hr over 30 Minutes Intravenous On call to O.R. 06/29/13 1127 06/29/13 1450      Assessment/Plan: Problem List: Patient Active Problem List   Diagnosis Date Noted  . 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 Moderate malnutrition 05/13/2013  . Lap Nissen + truncal vagotomy July 2008 05/13/2013  . COPD (chronic obstructive pulmonary disease) 11/07/2010  . Pneumonia, organism unspecified 11/07/2010  . DEPRESSION 06/06/2010  . LUNG NODULE 03/15/2010  . 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 FEVER, RECURRENT 08/11/2009  . WEIGHT LOSS 08/11/2009  . LEG EDEMA 01/19/2008  . GASTRIC OUTLET OBSTRUCTION 08/28/2007  . CIGARETTE SMOKER 08/24/2007  . GASTROPARESIS  06/03/2007  . PEPTIC ULCER DISEASE 04/30/2007  . SYMPTOM, PAIN, ABDOMINAL, GENERALIZED 03/16/2007  . ANXIETY 01/07/2007  . MENOPAUSE 01/07/2007  . BARRETT'S ESOPHAGUS, HX OF 07/03/2005  . ACUTE DUODEN ULCER W/HEMORR PERF&OBSTRUCTION 06/26/2004    Not eating much but wanting to go home.  If need to will unclog tube with wire.   Not able to discharge as PO intake is inadequate.  10 Days Post-Op    LOS: 10 days   [name], MD, [facility] [phone] beeper [phone]  07/09/2013 12:07 PM

## 2013-07-10 ENCOUNTER — Encounter (HOSPITAL_COMMUNITY): Payer: Self-pay | Admitting: Surgery

## 2013-07-10 DIAGNOSIS — R109 Unspecified abdominal pain: Secondary | ICD-10-CM

## 2013-07-10 DIAGNOSIS — K5909 Other constipation: Secondary | ICD-10-CM | POA: Diagnosis present

## 2013-07-10 DIAGNOSIS — G8929 Other chronic pain: Secondary | ICD-10-CM | POA: Insufficient documentation

## 2013-07-10 LAB — GLUCOSE, CAPILLARY
Glucose-Capillary: 106 mg/dL — ABNORMAL HIGH (ref 70–99)
Glucose-Capillary: 114 mg/dL — ABNORMAL HIGH (ref 70–99)

## 2013-07-10 MED ORDER — HYDROCODONE-ACETAMINOPHEN 5-325 MG PO TABS: 1.0000 | ORAL_TABLET | Freq: Four times a day (QID) | ORAL | Status: DC | PRN

## 2013-07-10 MED ORDER — SODIUM CHLORIDE 0.9 % IJ SOLN
3.0000 mL | Freq: Two times a day (BID) | INTRAMUSCULAR | Status: DC
  Administered 2013-07-10 – 2013-07-13 (×6): 3 mL via INTRAVENOUS

## 2013-07-10 MED ORDER — ADULT MULTIVITAMIN W/MINERALS CH
1.0000 | ORAL_TABLET | Freq: Every day | ORAL | Status: DC
  Administered 2013-07-10 – 2013-07-13 (×4): 1 via ORAL
  Filled 2013-07-10 (×5): qty 1

## 2013-07-10 MED ORDER — PROMETHAZINE HCL 25 MG RE SUPP: 25.0000 mg | Freq: Four times a day (QID) | RECTAL | Status: DC | PRN

## 2013-07-10 MED ORDER — JEVITY 1.2 CAL PO LIQD
1000.0000 mL | ORAL | Status: DC
  Filled 2013-07-10: qty 1000

## 2013-07-10 MED ORDER — ACETAMINOPHEN 500 MG PO TABS
1000.0000 mg | ORAL_TABLET | Freq: Three times a day (TID) | ORAL | Status: DC
  Administered 2013-07-11 – 2013-07-13 (×6): 1000 mg via ORAL
  Filled 2013-07-10 (×11): qty 2

## 2013-07-10 MED ORDER — LIP MEDEX EX OINT
1.0000 "application " | TOPICAL_OINTMENT | Freq: Two times a day (BID) | CUTANEOUS | Status: DC
  Administered 2013-07-10 – 2013-07-13 (×6): 1 via TOPICAL

## 2013-07-10 MED ORDER — HYDROCODONE-ACETAMINOPHEN 5-325 MG PO TABS
1.0000 | ORAL_TABLET | ORAL | Status: DC | PRN
  Administered 2013-07-10 – 2013-07-11 (×5): 2 via ORAL
  Filled 2013-07-10 (×5): qty 2

## 2013-07-10 MED ORDER — RISAQUAD PO CAPS: 1.0000 | ORAL_CAPSULE | Freq: Every day | ORAL | Status: DC

## 2013-07-10 MED ORDER — FLUOXETINE HCL 20 MG PO CAPS
40.0000 mg | ORAL_CAPSULE | Freq: Two times a day (BID) | ORAL | Status: DC
  Administered 2013-07-10 – 2013-07-13 (×7): 40 mg via ORAL
  Filled 2013-07-10 (×9): qty 2

## 2013-07-10 MED ORDER — OSMOLITE 1.2 CAL PO LIQD
1000.0000 mL | ORAL | Status: DC
  Administered 2013-07-10: 1000 mL
  Filled 2013-07-10 (×2): qty 1000

## 2013-07-10 MED ORDER — ENSURE COMPLETE PO LIQD
237.0000 mL | Freq: Three times a day (TID) | ORAL | Status: DC
  Administered 2013-07-11 – 2013-07-12 (×5): 237 mL via ORAL

## 2013-07-10 MED ORDER — ALUM & MAG HYDROXIDE-SIMETH 200-200-20 MG/5ML PO SUSP: 30.0000 mL | Freq: Four times a day (QID) | ORAL | Status: DC | PRN

## 2013-07-10 MED ORDER — SODIUM CHLORIDE 0.9 % IJ SOLN
3.0000 mL | INTRAMUSCULAR | Status: DC | PRN
  Administered 2013-07-12: 3 mL via INTRAVENOUS

## 2013-07-10 MED ORDER — POLYETHYLENE GLYCOL 3350 17 G PO PACK
17.0000 g | PACK | Freq: Two times a day (BID) | ORAL | Status: DC
Start: 2013-07-10 — End: 2013-07-13
  Administered 2013-07-10 – 2013-07-13 (×7): 17 g via ORAL
  Filled 2013-07-10 (×9): qty 1

## 2013-07-10 MED ORDER — OXYCODONE HCL 5 MG PO TABS: 5.0000 mg | ORAL_TABLET | ORAL | Status: DC | PRN

## 2013-07-10 MED ORDER — FLUOXETINE HCL 40 MG PO CAPS: 40.0000 mg | ORAL_CAPSULE | Freq: Two times a day (BID) | ORAL | Status: DC

## 2013-07-10 MED ORDER — MAGIC MOUTHWASH
15.0000 mL | Freq: Four times a day (QID) | ORAL | Status: DC | PRN
  Administered 2013-07-12: 15 mL via ORAL
  Filled 2013-07-10: qty 15

## 2013-07-10 MED ORDER — LACTATED RINGERS IV BOLUS (SEPSIS): 1000.0000 mL | Freq: Three times a day (TID) | INTRAVENOUS | Status: AC | PRN

## 2013-07-10 MED ORDER — BISACODYL 10 MG RE SUPP
10.0000 mg | Freq: Two times a day (BID) | RECTAL | Status: DC | PRN
  Administered 2013-07-10: 10 mg via RECTAL
  Filled 2013-07-10: qty 1

## 2013-07-10 NOTE — Progress Notes (Addendum)
[name] [id number] July 17, 1950  CARE TEAM:  PCP: [name] Morale, MD  Outpatient Care Team: Patient Care Team: [name] Morale, MD as PCP - General  Inpatient Treatment Team: Treatment Team: Attending Provider: Pedro Earls, MD; Dietitian: [name], RD; Registered Nurse: [name] Patience, RN; Registered Nurse: [name] Oppenheim, RN; Technician: [name], NT; Licensed Practical Nurse: [name], Student-LPN; Registered Nurse: [name] Reeve, RN; Technician: [name], NT; Registered Nurse: [name] Bream Block, RN; Technician: [name], NT   Subjective:  Complaining of pain.  Using PCA.  Note she can only tolerate a couple of bites of food.  Complaints of soreness at her incision.  Does not know it to do with her jejunostomy tube.    Wants to go home.  Objective:  Vital signs:  Filed Vitals:   07/10/13 0442 07/10/13 0528 07/10/13 0751 07/10/13 1000  BP:  93/56  92/60  Pulse:  61  68  Temp:  97.7 F (36.5 C)  98.7 F (37.1 C)  TempSrc:  Oral  Oral  Resp: 14 16 16 18   Height:      Weight:   104 lb 12.8 oz (47.537 kg)   SpO2: 96% 94% 94% 96%    Last BM Date: 06/28/13  Intake/Output   Yesterday:  01/16 0701 - 01/17 0700 In: 2520 [P.O.:1320; I.V.:1200] Out: 4400 [Urine:4400] This shift:  Total I/O In: -  Out: 300 [Urine:300]  Bowel function:  Flatus: y  BM: n  Drain: I was able to unclog the jejunostomy tube with a warm bolus of water  Physical Exam:  General: Pt awake/alert/oriented x4 in no acute distress Eyes: PERRL, normal EOM.  Sclera clear.  No icterus Neuro: CN II-XII intact w/o focal sensory/motor deficits. Lymph: No head/neck/groin lymphadenopathy Psych:  No delerium/psychosis/paranoia HENT: Normocephalic, Mucus membranes moist.  No thrush Neck: Supple, No tracheal deviation Chest: No chest wall pain w good excursion CV:  Pulses intact.  Regular rhythm MS: Normal AROM mjr  joints.  No obvious deformity Abdomen: Soft.  Nondistended.  Midline incision well healed with normal midline ridge.  Very sensitive to light touch.  Mildly tender at incision only.  No evidence of peritonitis.  No incarcerated hernias. Ext:  SCDs BLE.  No mjr edema.  No cyanosis Skin: No petechiae / purpura   Problem List:   Principal Problem:   Chronic duodenal ulcer with gastric outlet obstruction Active Problems:   ANXIETY   CIGARETTE SMOKER   Gastroparesis   WEIGHT LOSS   SYMPTOM, PAIN, ABDOMINAL, GENERALIZED   DEPRESSION   COPD (chronic obstructive pulmonary disease)   Moderate malnutrition   Assessment  [name]  63 y.o. female  11 Days Post-Op  Procedure(s): Gastrectomy and GJ Tube placement  Poor progression with poor insight  Plan:  I tried to stress to her the importance of adequate nutrition and pain control without IV meds before safe for discharge.   The point of the surgery was to bypass the blockage and give her chance for body to rally.   It took a while for that to sink in.  She seemed disappointed I would not let her go anyway despite the fact that she tells me she cannot eat and she hurts.  I think that she is just used to feeling this way and does not feel like there is any new distress. I noted he can take several months for the gastrojejunostomy/partial gastrectomy to open up and  worked adequately.  Hence the importance of the tube feeds  Stop PCA.  Do intermittent IV medicines.  Start using enteral pain medication.  Place on standing Tylenol.  She cannot go home on IV medicines.  She will bounce right back.  With tube unclogged, start tube feeds.  Calorie counts.  If she can prove to she can have adequate PO nutrition, stop tube feeds.  Suspect she may benefit from nightly tube feeds at home so she does not lose weight and breakdown.  VTE prophylaxis- SCDs, etc  More aggressive bowel regimen for her chronic constipation.  Gradually dialup on the  MiraLax until it works.  mobilize as tolerated to help recovery  [name], M.D., F.A.C.S. [facility]astro[facility]ally [facility] [address] [place], [place] [address] [phone] Main / Paging   07/10/2013   Results:   Labs: No results found for this or any previous visit (from the past 48 hour(s)).  Imaging / Studies: No results found.  Medications / Allergies: per chart  Antibiotics: Anti-infectives   Start     Dose/Rate Route Frequency Ordered Stop   06/29/13 1800  cefOXitin (MEFOXIN) 1 g in dextrose 5 % 50 mL IVPB     1 g 100 mL/hr over 30 Minutes Intravenous 4 times per day 06/29/13 1752 06/29/13 1829   06/29/13 1145  cefOXitin (MEFOXIN) 2 g in dextrose 5 % 50 mL IVPB     2 g 100 mL/hr over 30 Minutes Intravenous On call to O.R. 06/29/13 1127 06/29/13 1450       Note: This dictation was prepared with Dragon/digital dictation along with [employer]. Any transcriptional errors that result from this process are unintentional.

## 2013-07-10 NOTE — Progress Notes (Signed)
NUTRITION FOLLOW UP/CONSULT FOR TF MANAGEMENT  Intervention:   - Will start TF via J part of GJ tube of Osmolite 1.2 at 47m/hr increase by 131mevery 4 hours to goal rate of 5543mr. This will provide 1584 calories, 73g protein, and 1065m57mee water and meet 100% of estimated nutritional needs. If IVF d/c, recommend 130ml25mer flushes 4 times/day - Recommend bowel regimen per MD as last BM documented was 1/5  - Ensure Complete TID - Will continue to monitor   Nutrition Dx:   Inadequate oral intake related to stomach pain, not liking the food as evidenced by <25% meal intake - improving   Goal:   Pt to consume >90% of meals/supplements - not met consistently  New goal: TF tolerance with goal to meet >90% of estimated nutritional needs while PO intake inadequate   Monitor:   Weights, labs, intake, TF advancement/tolerance, BM  Assessment:   Admitted with gastric outlet obstruction, had gastrectomy and GJ tube placement yesterday. Pt was started on trickle TF of Jevity 1.2 at 20ml/106m/6/15.   1/7 - Met with pt who reports having NGT placed last month for TF however was only on TF for 2-3 days before tube got clogged. Reports she has had stomach pain since the 1990s and had nausea for a long time. Reports she has been eating a lot of iron rich foods recently to give her more energy and typical daily intake includes tuna, spaghetti, and Ensure. Reports she was eating all the time. Pt does not have any teeth. Reports she was weighing 90 pounds recently, now at 111 pounds.   1/9 - Pt getting TF of Jevity 1.2 at 50ml/h23mrovides 1440 calories, 67g protein, 968ml fr74mater meeting 100% of estimated nutritional needs). Reports having nausea but does not think this is related to the TF as she has had chronic nausea even before feeding tube placed. She states she is not interested in trying another TF formula. Last BM 1/5. Spoke with RN about changing TF formula and recommended switching to  Osmolite 1.2 as appropriate for cost savings as pt already has this product at home.   1/12 - Reviewed events since last RD visit. Feeding tube clogged 1/10.   1/15 - Noted recent events. Pt started on clear liquids 1/13, full liquids 1/14, and dysphagia 3/thin today. Pt reports not eating much today due to not liking the food, was working with kitchen to re-order meal to find something she liked. Feeding tube still clogged. Pt agreeable to getting Ensure Complete as pt with inadequate oral intake. Pt denies any nausea, only c/o stomach pain.   1/17 - GJ tube unclogged, MD ordered RD to manage TF. Started on post-gastrectomy diet at lunch. Ate 100% of dinner last night per nursing documentation.    Height: Ht Readings from Last 1 Encounters:  06/29/13 5' 3" (1.6 m)    Weight Status:   Wt Readings from Last 1 Encounters:  07/10/13 104 lb 12.8 oz (47.537 kg)  Admit wt:        105 lb 13.1 oz (48 kg)    Re-estimated needs:  Kcal: 1400-1600  Protein: 60-75g  Fluid: 1.4-1.6L/day   Skin: Abdominal incision    Diet Order: Post Gastrectomy 3, thin   Intake/Output Summary (Last 24 hours) at 07/10/13 1350 Last data filed at 07/10/13 1000  Gross per 24 hour  Intake   1920 ml  Output   3500 ml  Net  -1580 ml    Last  BM: 1/5   Labs:  No results found for this basename: NA, K, CL, CO2, BUN, CREATININE, CALCIUM, MG, PHOS, GLUCOSE,  in the last 168 hours  CBG (last 3)  No results found for this basename: GLUCAP,  in the last 72 hours  Scheduled Meds: . acetaminophen  1,000 mg Oral TID  . antiseptic oral rinse  15 mL Mouth Rinse q12n4p  . chlorhexidine  15 mL Mouth Rinse BID  . FLUoxetine  40 mg Oral BID  . heparin  5,000 Units Subcutaneous Q8H  . lip balm  1 application Topical BID  . multivitamin with minerals  1 tablet Oral Daily  . nicotine  21 mg Transdermal Daily  . polyethylene glycol  17 g Oral BID  . sodium chloride  3 mL Intravenous Q12H    Continuous  Infusions: . feeding supplement (JEVITY 1.2 CAL)       Mikey College MS, RD, LDN [phone] Weekend/After Hours Pager

## 2013-07-11 DIAGNOSIS — F3289 Other specified depressive episodes: Secondary | ICD-10-CM

## 2013-07-11 DIAGNOSIS — E46 Unspecified protein-calorie malnutrition: Secondary | ICD-10-CM

## 2013-07-11 DIAGNOSIS — F329 Major depressive disorder, single episode, unspecified: Secondary | ICD-10-CM

## 2013-07-11 LAB — GLUCOSE, CAPILLARY
GLUCOSE-CAPILLARY: 113 mg/dL — AB (ref 70–99)
GLUCOSE-CAPILLARY: 112 mg/dL — AB (ref 70–99)
Glucose-Capillary: 134 mg/dL — ABNORMAL HIGH (ref 70–99)
Glucose-Capillary: 121 mg/dL — ABNORMAL HIGH (ref 70–99)

## 2013-07-11 MED ORDER — OXYCODONE HCL 5 MG PO TABS
5.0000 mg | ORAL_TABLET | ORAL | Status: DC | PRN
  Administered 2013-07-11 – 2013-07-13 (×12): 15 mg via ORAL
  Filled 2013-07-11 (×12): qty 3

## 2013-07-11 MED ORDER — OSMOLITE 1.2 CAL PO LIQD
1000.0000 mL | ORAL | Status: DC
  Administered 2013-07-11 – 2013-07-12 (×2): 1000 mL
  Filled 2013-07-11 (×4): qty 1000

## 2013-07-11 NOTE — Progress Notes (Signed)
[name] [id number] March 11, 1951  CARE TEAM:  PCP: [name], MD  Outpatient Care Team: Patient Care Team: [name], MD as PCP - General  Inpatient Treatment Team: Treatment Team: Attending Provider: Pedro Earls, MD; Dietitian: Christie Beckers, RD; Registered Nurse: Rise Patience, RN; Registered Nurse: Kristopher Oppenheim, RN; Technician: Zoe Lan Flinchum-Land, NT; Licensed Practical Nurse: Laurance Flatten Fields, Student-LPN; Registered Nurse: Emeterio Reeve, RN; Technician: Evalina Field, NT; Registered Nurse: Jaynie Bream Block, RN; Technician: Senaida Ores Debbink, NT   Subjective:  Complaining of pain, using IV meds.  Large BM yest "It cleaned me out"  PO intake fair.  TFs nearly at goal  "I feel like I fell apart"    Wants to go home.  Objective:  Vital signs:  Filed Vitals:   07/10/13 1800 07/10/13 2145 07/11/13 0500 07/11/13 0507  BP: 97/63 94/61  103/64  Pulse: 66 57  55  Temp: 98.9 F (37.2 C) 98 F (36.7 C)  98 F (36.7 C)  TempSrc: Oral Oral  Oral  Resp: 18 16  16   Height:      Weight:   100 lb 12 oz (45.7 kg)   SpO2: 99% 92%  92%    Last BM Date: 07/10/13  Intake/Output   Yesterday:  01/17 0701 - 01/18 0700 In: 570 [P.O.:60; NG/GT:390] Out: 3900 [Urine:3900] This shift:     Bowel function:  Flatus: y  BM: Yes  Drain: Jejunostomy tube working well  Physical Exam:  General: Pt awake/alert/oriented x4 in no acute distress Eyes: PERRL, normal EOM.  Sclera clear.  No icterus Neuro: CN II-XII intact w/o focal sensory/motor deficits. Lymph: No head/neck/groin lymphadenopathy Psych:  No delerium/psychosis/paranoia HENT: Normocephalic, Mucus membranes moist.  No thrush Neck: Supple, No tracheal deviation Chest: No chest wall pain w good excursion CV:  Pulses intact.  Regular rhythm MS: Normal AROM mjr joints.  No obvious deformity Abdomen: Soft.  Nondistended.  Midline incision well healed with normal midline  ridge.  Very sensitive to light touch.  Mildly tender at incision only.  No evidence of peritonitis.  No incarcerated hernias. Ext:  SCDs BLE.  No mjr edema.  No cyanosis Skin: No petechiae / purpura   Problem List:   Principal Problem:   Chronic duodenal ulcer with gastric outlet obstruction Active Problems:   ANXIETY   CIGARETTE SMOKER   Gastroparesis   WEIGHT LOSS   SYMPTOM, PAIN, ABDOMINAL, GENERALIZED   DEPRESSION   COPD (chronic obstructive pulmonary disease)   Moderate malnutrition   Constipation, chronic   Assessment  [name]  63 y.o. female  12 Days Post-Op  Procedure(s): Gastrectomy and GJ Tube placement  Poor progression with poor insight  Plan:  I tried to stress to her the importance of adequate nutrition and pain control without IV meds before safe for discharge.   The point of the surgery was to bypass the blockage and give her chance for body to rally.   It took a while for that to sink in.  She seemed disappointed I would not let her go anyway despite the fact that she tells me she cannot eat and she hurts.  I think that she is just used to feeling this way and does not feel like there is any new distress. I noted he can take several months for the gastrojejunostomy/partial gastrectomy to open up and worked adequately.  Hence the importance of the tube feeds.  Switched to oxycodone for  pain control.  See if that works better for her.  Continue RTC standing Tylenol.  She cannot go home on IV medicines.  She will bounce right back.  Tube feeds at goal.  See if they can start cycling 2 at night only.  Perhaps get 60-70% of nutrition at night and the rest by mouth.  Hopefully gradually weaned off.  She seemed to look at tube feeds as a failure of recovery.  I stressed to her the importance of getting adequate nutrition so that she avoids serious problems.  I noted it is a short-term solution that gradually should not be needed over the next one to 2 months as she  is able to the OR.  Calorie counts.  If she can prove to she can have adequate PO nutrition, stop tube feeds.  Suspect she may benefit from nightly tube feeds at home so she does not lose weight and breakdown.  VTE prophylaxis- SCDs, etc  More aggressive bowel regimen for her chronic constipation.  Gradually dialup on the MiraLax until it works.  mobilize as tolerated to help recovery  I think her depression and anxiety are massive road blocks to her recovery.  I started her Prozac backup yesterday.  Hopefully that will help.  I tried to spend time with her to persuade her to trust Dr. Earlie Server plan and to have hope that she will get better.  I think she is not convinced.  I will let her discuss with Dr. Hassell Done tomorrow  Adin Hector, M.D., F.A.C.S. Gastrointestinal and Minimally Invasive Surgery [facility] [address], [place], [place] [address] [phone] Main / Paging   07/11/2013   Results:   Labs: Results for orders placed during the hospital encounter of 06/29/13 (from the past 48 hour(s))  GLUCOSE, CAPILLARY     Status: Abnormal   Collection Time    07/10/13  8:21 PM      Result Value Range   Glucose-Capillary 106 (*) 70 - 99 mg/dL  GLUCOSE, CAPILLARY     Status: Abnormal   Collection Time    07/10/13 11:44 PM      Result Value Range   Glucose-Capillary 114 (*) 70 - 99 mg/dL  GLUCOSE, CAPILLARY     Status: Abnormal   Collection Time    07/11/13  3:46 AM      Result Value Range   Glucose-Capillary 134 (*) 70 - 99 mg/dL    Imaging / Studies: No results found.  Medications / Allergies: per chart  Antibiotics: Anti-infectives   Start     Dose/Rate Route Frequency Ordered Stop   06/29/13 1800  cefOXitin (MEFOXIN) 1 g in dextrose 5 % 50 mL IVPB     1 g 100 mL/hr over 30 Minutes Intravenous 4 times per day 06/29/13 1752 06/29/13 1829   06/29/13 1145  cefOXitin (MEFOXIN) 2 g in dextrose 5 % 50 mL IVPB     2 g 100 mL/hr  over 30 Minutes Intravenous On call to O.R. 06/29/13 1127 06/29/13 1450       Note: This dictation was prepared with Dragon/digital dictation along with [employer]. Any transcriptional errors that result from this process are unintentional.

## 2013-07-12 LAB — GLUCOSE, CAPILLARY
GLUCOSE-CAPILLARY: 100 mg/dL — AB (ref 70–99)
Glucose-Capillary: 106 mg/dL — ABNORMAL HIGH (ref 70–99)
Glucose-Capillary: 106 mg/dL — ABNORMAL HIGH (ref 70–99)

## 2013-07-12 NOTE — Progress Notes (Signed)
NUTRITION FOLLOW UP/CONSULT FOR TF MANAGEMENT  Intervention:   - Continue TF via J part of GJ tube of Osmolite 1.2 at 55ml/hr. This provides 1584 calories, 73g protein, and 1065ml free water and meet 100% of estimated nutritional needs. If IVF d/c, recommend 130ml water flushes 4 times/day. - If po with nutritional supplements is able to consistently meet >50% of needs, recommend reducing TF by half.  - Continue Ensure Complete po TID, each supplement provides 350 kcal and 13 grams of protein - Diet education on gastrectomy nutrition therapy including soft foods, foods to avoid, eating protein with all meals and snacks, limiting liquids with meals, and consuming small, frequent meals with liquids between. - Continue calorie count though 1/20 - RD will continue to monitor   Calorie Count  48-hour calorie count ordered.  Diet: post-gastrectomy Supplements: Ensure Complete TID  1/18-1/19  Breakfast 1/19: 214 kcal, 7 g protein Lunch 1/18: 270 kcal, 7 g protein Dinner 1/18: 205, 18 g Supplements: none  Total intake: 689 kcal (between 43-49% of minimum estimated needs)  32 protein (between 43-53% of minimum estimated needs)  Nutrition Dx:   Inadequate oral intake related to stomach pain, not liking the food as evidenced by <25% meal intake - improving   Goal:   Pt to consume >90% of meals/supplements - not met consistently  New goal: TF tolerance with goal to meet >90% of estimated nutritional needs while PO intake inadequate; met  Monitor:   Weights, labs, intake, TF advancement/tolerance, BM, po intake  Assessment:   Admitted with gastric outlet obstruction, had gastrectomy and GJ tube placement yesterday. Pt was started on trickle TF of Jevity 1.2 at 20ml/hr 06/29/13.   1/7 - Met with pt who reports having NGT placed last month for TF however was only on TF for 2-3 days before tube got clogged. Reports she has had stomach pain since the 1990s and had nausea for a long time.  Reports she has been eating a lot of iron rich foods recently to give her more energy and typical daily intake includes tuna, spaghetti, and Ensure. Reports she was eating all the time. Pt does not have any teeth. Reports she was weighing 90 pounds recently, now at 111 pounds.   1/9 - Pt getting TF of Jevity 1.2 at 50ml/hr (provides 1440 calories, 67g protein, 968ml free water meeting 100% of estimated nutritional needs). Reports having nausea but does not think this is related to the TF as she has had chronic nausea even before feeding tube placed. She states she is not interested in trying another TF formula. Last BM 1/5. Spoke with RN about changing TF formula and recommended switching to Osmolite 1.2 as appropriate for cost savings as pt already has this product at home.   1/12 - Reviewed events since last RD visit. Feeding tube clogged 1/10.   1/15 - Noted recent events. Pt started on clear liquids 1/13, full liquids 1/14, and dysphagia 3/thin today. Pt reports not eating much today due to not liking the food, was working with kitchen to re-order meal to find something she liked. Feeding tube still clogged. Pt agreeable to getting Ensure Complete as pt with inadequate oral intake. Pt denies any nausea, only c/o stomach pain.   1/17 - GJ tube unclogged, MD ordered RD to manage TF. Started on post-gastrectomy diet at lunch. Ate 100% of dinner last night per nursing documentation.   1/19- Pt educated on diet to continue at home regarding gastrectomy. Pt's intake is close   to 50%, but pt feels like she is unable to eat much food due to fullness from tube feeding. When RD mentioned cutting TF in half to pt, pt said that she fears she will lose weight if we do that at this time. She said that a family member ate some of the food off of her trays. Calorie count may be inaccurate for this reason. Pt says that she has not been drinking Ensure Complete since TF was started. Will continue calorie count for  another day.  Height: Ht Readings from Last 1 Encounters:  06/29/13 5' 3" (1.6 m)    Weight Status:   Wt Readings from Last 1 Encounters:  07/12/13 104 lb 15 oz (47.6 kg)  Admit wt:        105 lb 13.1 oz (48 kg)    Re-estimated needs:  Kcal: 1400-1600  Protein: 60-75g  Fluid: 1.4-1.6L/day   Skin: Abdominal incision    Diet Order: Post Gastrectomy 3, thin   Intake/Output Summary (Last 24 hours) at 07/12/13 1606 Last data filed at 07/12/13 1400  Gross per 24 hour  Intake    683 ml  Output   1575 ml  Net   -892 ml    Last BM: 1/19   Labs:  No results found for this basename: NA, K, CL, CO2, BUN, CREATININE, CALCIUM, MG, PHOS, GLUCOSE,  in the last 168 hours  CBG (last 3)   Recent Labs  07/11/13 1919 07/11/13 2355 07/12/13 0357  GLUCAP 112* 106* 106*    Scheduled Meds: . acetaminophen  1,000 mg Oral TID  . antiseptic oral rinse  15 mL Mouth Rinse q12n4p  . chlorhexidine  15 mL Mouth Rinse BID  . feeding supplement (ENSURE COMPLETE)  237 mL Oral TID WC  . feeding supplement (OSMOLITE 1.2 CAL)  1,000 mL Per Tube Q24H  . FLUoxetine  40 mg Oral BID  . heparin  5,000 Units Subcutaneous Q8H  . lip balm  1 application Topical BID  . multivitamin with minerals  1 tablet Oral Daily  . nicotine  21 mg Transdermal Daily  . polyethylene glycol  17 g Oral BID  . sodium chloride  3 mL Intravenous Q12H    Continuous Infusions:      Hawkins RD, LDN   

## 2013-07-12 NOTE — Progress Notes (Signed)
Patient ID: [name], female   DOB: [birth year], 63 y.o.   MRN: [id number] [facility] Progress Note:   13 Days Post-Op  Subjective: Mental status is clear.  Pain is better controlled with oral pain meds Objective: Vital signs in last 24 hours: Temp:  [97.6 F (36.4 C)-98.6 F (37 C)] 97.6 F (36.4 C) (01/19 0425) Pulse Rate:  [59-62] 59 (01/19 0425) Resp:  [18] 18 (01/19 0425) BP: (94-102)/(56-68) 102/56 mmHg (01/19 0425) SpO2:  [90 %-92 %] 91 % (01/19 0425) Weight:  [47.6 kg (104 lb 15 oz)] 47.6 kg (104 lb 15 oz) (01/19 0425)  Intake/Output from previous day: 01/18 0701 - 01/19 0700 In: 753 [P.O.:80; I.V.:3; NG/GT:640] Out: 2175 [Urine:2175] Intake/Output this shift: Total I/O In: 120 [P.O.:120] Out: -   Physical Exam: Work of breathing is baseline.  Complaining of incisional soreness.  Incision OK  Lab Results:  Results for orders placed during the hospital encounter of 06/29/13 (from the past 48 hour(s))  GLUCOSE, CAPILLARY     Status: Abnormal   Collection Time    07/10/13  8:21 PM      Result Value Range   Glucose-Capillary 106 (*) 70 - 99 mg/dL  GLUCOSE, CAPILLARY     Status: Abnormal   Collection Time    07/10/13 11:44 PM      Result Value Range   Glucose-Capillary 114 (*) 70 - 99 mg/dL  GLUCOSE, CAPILLARY     Status: Abnormal   Collection Time    07/11/13  3:46 AM      Result Value Range   Glucose-Capillary 134 (*) 70 - 99 mg/dL  GLUCOSE, CAPILLARY     Status: Abnormal   Collection Time    07/11/13 10:33 AM      Result Value Range   Glucose-Capillary 113 (*) 70 - 99 mg/dL  GLUCOSE, CAPILLARY     Status: Abnormal   Collection Time    07/11/13 11:59 AM      Result Value Range   Glucose-Capillary 121 (*) 70 - 99 mg/dL  GLUCOSE, CAPILLARY     Status: Abnormal   Collection Time    07/11/13  7:19 PM      Result Value Range   Glucose-Capillary 112 (*) 70 - 99 mg/dL  GLUCOSE, CAPILLARY     Status: Abnormal   Collection Time    07/11/13  11:55 PM      Result Value Range   Glucose-Capillary 106 (*) 70 - 99 mg/dL  GLUCOSE, CAPILLARY     Status: Abnormal   Collection Time    07/12/13  3:57 AM      Result Value Range   Glucose-Capillary 106 (*) 70 - 99 mg/dL    Radiology/Results: No results found.  Anti-infectives: Anti-infectives   Start     Dose/Rate Route Frequency Ordered Stop   06/29/13 1800  cefOXitin (MEFOXIN) 1 g in dextrose 5 % 50 mL IVPB     1 g 100 mL/hr over 30 Minutes Intravenous 4 times per day 06/29/13 1752 06/29/13 1829   06/29/13 1145  cefOXitin (MEFOXIN) 2 g in dextrose 5 % 50 mL IVPB     2 g 100 mL/hr over 30 Minutes Intravenous On call to O.R. 06/29/13 1127 06/29/13 1450      Assessment/Plan: Problem List: Patient Active Problem List   Diagnosis Date Noted  . Constipation, chronic 07/10/2013  . Chronic abdominal pain   . 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 Moderate malnutrition  05/13/2013  . COPD (chronic obstructive pulmonary disease) 11/07/2010  . DEPRESSION 06/06/2010  . LUNG NODULE 03/15/2010  . 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 WEIGHT LOSS 08/11/2009  . GASTRIC OUTLET OBSTRUCTION 08/28/2007  . CIGARETTE SMOKER 08/24/2007  . Gastroparesis 06/03/2007  . SYMPTOM, PAIN, ABDOMINAL, GENERALIZED 03/16/2007  . ANXIETY 01/07/2007  . BARRETT'S ESOPHAGUS, HX OF 07/03/2005    Plan discharge tomorrow with nocturnal tube feedings to supplement marginal PO intake.  13 Days Post-Op    LOS: 13 days   [name], MD, [facility] [phone] beeper [phone]  07/12/2013 11:00 AM

## 2013-07-13 LAB — GLUCOSE, CAPILLARY
GLUCOSE-CAPILLARY: 102 mg/dL — AB (ref 70–99)
Glucose-Capillary: 93 mg/dL (ref 70–99)
Glucose-Capillary: 108 mg/dL — ABNORMAL HIGH (ref 70–99)

## 2013-07-13 MED ORDER — OXYCODONE HCL 5 MG PO TABS: 5.0000 mg | ORAL_TABLET | ORAL | Status: DC | PRN

## 2013-07-13 NOTE — Discharge Summary (Signed)
Physician Discharge Summary  Patient ID: [name] MRN: [id number] DOB/AGE: May 13, 1951 63 y.o.  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  Issues with malnutrition addressed with tube feedings but tube became occluded.  Ultimately it was reopened to resume tube feedings.  Slow progress.  Ready for discharge.  Consults: none  Significant Diagnostic Studies: UGI    Discharge Exam: Blood pressure 105/71, pulse 59, temperature 98 F (36.7 C), temperature source Oral, resp. rate 18, height 5\' 3"  (1.6 m), weight 47.5 kg (104 lb 11.5 oz), SpO2 92.00%. Incision OK.  Abdominal pain better,.  Feeding jejunostomy in place  Disposition: 01-Home or Self Care  Discharge Orders   Future Appointments Provider Department Dept Phone   08/18/2013 9:50 AM Pedro Earls, MD [facility], [place] [phone]   Future Orders Complete By Expires   Diet - low sodium heart healthy  As directed    Discharge instructions  As directed    Comments:     Case management to arrange for tube feedings at home. Would use 3/4 strength Jevity or Osmolite to prevent occlusion of tube.   Increase activity slowly  As directed    No dressing needed  As directed        Medication List         ferrous sulfate 325 (65 FE) MG tablet  Take 325 mg by mouth 2 (two) times daily with a meal.     FLUoxetine 40 MG capsule  Commonly known as:  PROZAC  Take 40 mg by mouth 2  (two) times daily.     HYDROcodone-acetaminophen 5-325 MG per tablet  Commonly known as:  NORCO/VICODIN  Take 1 tablet by mouth every 6 (six) hours as needed for moderate pain or severe pain.     multivitamin with minerals Tabs tablet  Take 1 tablet by mouth daily.     omeprazole 40 MG capsule  Commonly known as:  PRILOSEC  Take 1 capsule (40 mg total) by mouth 2 (two) times daily.     oxyCODONE 5 MG immediate release tablet  Commonly known as:  Oxy IR/ROXICODONE  Take 1-3 tablets (5-15 mg total) by mouth every 4 (four) hours as needed for moderate pain, severe pain or breakthrough pain.     promethazine 25 MG suppository  Commonly known as:  PHENERGAN  Place 25 mg rectally every 6 (six) hours as needed for nausea or vomiting.           Follow-up Information   Follow up with Johnathan Hausen B, MD In 4 weeks.   Specialty:  General Surgery   Contact information:   [address] [place] [address] [phone]       Signed: Pedro Earls 07/13/2013, 1:59 PM

## 2013-07-13 NOTE — Progress Notes (Signed)
Discharge instructions reviewed with patient and family, patient with stable vital signs, incision within normal limits, tolerating tube feeding at goal rate and post gastrectomy diet, questions and concerns answered Neta Mends RN 07-13-2013 14:53pm

## 2013-07-14 ENCOUNTER — Telehealth (INDEPENDENT_AMBULATORY_CARE_PROVIDER_SITE_OTHER): Payer: Self-pay | Admitting: General Surgery

## 2013-07-14 DIAGNOSIS — Z931 Gastrostomy status: Secondary | ICD-10-CM

## 2013-07-14 DIAGNOSIS — Z903 Acquired absence of stomach [part of]: Secondary | ICD-10-CM

## 2013-07-14 NOTE — Telephone Encounter (Signed)
Pt just discharged from hospital stay, and [facility] is doing the home health nursing care.   Need new orders for [facility] to supply the tube feeding, bags and pump FAXed to [phone], for [facility] to proceed.  Dr. Earlie Server note in Epic from hospital addressed "nocturnal feedings" where she has been on continuous feeds while in-patient.  The feeds have most recently been Osmolite 1.2.

## 2013-07-15 NOTE — Telephone Encounter (Signed)
Order entered

## 2013-07-15 NOTE — Addendum Note (Signed)
Addended byGweneth Fritter on: 07/15/2013 01:11 PM   Modules accepted: Orders

## 2013-07-16 ENCOUNTER — Telehealth (INDEPENDENT_AMBULATORY_CARE_PROVIDER_SITE_OTHER): Payer: Self-pay | Admitting: General Surgery

## 2013-07-16 NOTE — Telephone Encounter (Signed)
Nurse calling for orders for feeding.  Orders were read from Dr. Earlie Server referral.  1.2k/cc @ 55cc's q hour, run for 12 hours overnight.

## 2013-07-16 NOTE — Telephone Encounter (Signed)
Magda Paganini, nurse with [facility] , is with pt, who is nearly out of pain medicine.  She is requesting Hydrocodone instead of Oxycodone, which is what is the protocol med anyway.  She will send her friend, [name], to pick up the Rx.  Since this is the first request for pain med refill, Norco 5/325 mg, # 30 (thirty), 1-2 po Q4-6H prn pain, no refill written for Dr. Hassell Done to sign.

## 2013-07-16 NOTE — Telephone Encounter (Signed)
Contacted pt that her Rx is ready for pick and to tell [name] she must present photo ID to get it.  Pt stated her friend does not have drivers license or photo ID, so told pt that she cannot get this Rx.  Asked if her mother was available, as she is on the HIPPA form, and she will call her mother to see if she can come get it.

## 2013-07-21 ENCOUNTER — Encounter (INDEPENDENT_AMBULATORY_CARE_PROVIDER_SITE_OTHER): Payer: Self-pay | Admitting: Surgery

## 2013-07-21 ENCOUNTER — Ambulatory Visit (INDEPENDENT_AMBULATORY_CARE_PROVIDER_SITE_OTHER): Payer: Medicare Other | Admitting: Surgery

## 2013-07-21 VITALS — BP 111/78 | HR 77 | Temp 99.1°F | Resp 14 | Ht 64.0 in | Wt 99.8 lb

## 2013-07-21 DIAGNOSIS — K311 Adult hypertrophic pyloric stenosis: Secondary | ICD-10-CM

## 2013-07-21 MED ORDER — OXYCODONE HCL 5 MG PO TABS: 5.0000 mg | ORAL_TABLET | ORAL | Status: DC | PRN

## 2013-07-21 MED ORDER — ALPRAZOLAM 0.25 MG PO TABS: 0.2500 mg | ORAL_TABLET | Freq: Every evening | ORAL | Status: DC | PRN

## 2013-07-21 NOTE — Patient Instructions (Signed)
Try to get some exercise-walk on treadmill and lift small weights

## 2013-07-21 NOTE — Progress Notes (Signed)
[name] 63 y.o.  Body mass index is 17.12 kg/(m^2).  Patient Active Problem List   Diagnosis Date Noted  . Constipation, chronic 07/10/2013  . Chronic abdominal pain   . 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 Moderate malnutrition 05/13/2013  . COPD (chronic obstructive pulmonary disease) 11/07/2010  . DEPRESSION 06/06/2010  . LUNG NODULE 03/15/2010  . 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 WEIGHT LOSS 08/11/2009  . GASTRIC OUTLET OBSTRUCTION 08/28/2007  . CIGARETTE SMOKER 08/24/2007  . Gastroparesis 06/03/2007  . SYMPTOM, PAIN, ABDOMINAL, GENERALIZED 03/16/2007  . ANXIETY 01/07/2007  . BARRETT'S ESOPHAGUS, HX OF 07/03/2005    Allergies  Allergen Reactions  . Alprazolam     REACTION: unspecified  . Celebrex [Celecoxib] Nausea Only  . Morphine     REACTION: itching  . Quetiapine     REACTION: tardive dyskinesia  . Varenicline Tartrate     REACTION: unspecified    Past Surgical History  Procedure Laterality Date  . Esophagogastroduodenoscopy  12-29-09    dr qadeer at [facility], several gastric ulcers  . Vagotomy      lapaproscopic - [facility]  . 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 Hernia repair    . Perforated ulcer    . Biopsy thyroid  08-13-11    benign nodule, per Dr. Melida Quitter   . Esophagogastroduodenoscopy N/A 09/25/2012    Procedure: ESOPHAGOGASTRODUODENOSCOPY (EGD);  Surgeon: Beryle Beams, MD;  Location: [facility];  Service: Endoscopy;  Laterality: N/A;  . Fracture surgery Bilateral     hips with screw  . Tonsillectomy    . Eye surgery Bilateral     cataract extraction with IOL  . Laparoscopic partial gastrectomy N/A 06/29/2013    Procedure: Gastrectomy and GJ Tube placement;  Surgeon: Pedro Earls, MD;  Location: [facility];  Service: General;  Laterality: N/A;   [name], MD 1. Gastric outlet obstruction     Tomasa wants her tube removed. I told her I will wait a minimum of 6 weeks before doing that. She  is having a lot of anxiety because that so we'll give her some Xanax to try to help her rest of. Also offered her a refill for pain medication. [name], MD, [facility] [phone] beeper [phone]  07/21/2013 4:27 PM

## 2013-07-26 ENCOUNTER — Telehealth (INDEPENDENT_AMBULATORY_CARE_PROVIDER_SITE_OTHER): Payer: Self-pay | Admitting: *Deleted

## 2013-07-26 NOTE — Telephone Encounter (Signed)
Patient called to ask for refill of pain medication.  Patient reports that she is still having pain at the surgical site.  Patient had gasterectomy and G tube placed on 06/29/13.  Patient received her per protocol refill already on 07/16/13.  Explained that I will send a message to Dr. Hassell Done to ask about another refill but it will be tomorrow before I have an answer.  Patient is reluctant because she states she really needs them today however I explained that there is a 24 hour turn around on these prescriptions and because she has already had 1 refill Dr. Hassell Done really needs to approve for another refill.  Patient states understanding and understands that I will call her as soon as I have an answer.

## 2013-08-02 NOTE — Telephone Encounter (Signed)
Message sent to Dr. Hassell Done regarding refill request for Oxycodone IR 5mg .  Prescription for Oxycodone IR 5mg  approved for refill.  Prescription sent to Dr. Marlou Starks in Urgent Office to sign.  Patient notified that RX will be at the front desk for pick up.

## 2013-08-06 ENCOUNTER — Telehealth (INDEPENDENT_AMBULATORY_CARE_PROVIDER_SITE_OTHER): Payer: Self-pay

## 2013-08-06 NOTE — Telephone Encounter (Signed)
Pt calling requesting refill for hydrocodone. Pt states it seems to work better for her than oxycodone. Pt states she is having a lot of soreness st feeding tube site. States same as when she called for last refill. Pt states she needs pain med so she can get relief of soreness at tube site. She states it does not look infected.  Wound doing well. No fever. BMs daily. Voiding well. Pt advised this request will be sent to Dr Hassell Done and his assistant for review. Pt aware it may take 24 hours for response. Pt states she has enough med to last till Monday if needed. Pt can be reached at [phone].

## 2013-08-06 NOTE — Telephone Encounter (Signed)
Called and left message for patient to call our office.  Reviewed with Dr. Hassell Done patient's request and it has been denied at this time.  Patient rec'd pain medication on Monday 08/02/13.

## 2013-08-10 ENCOUNTER — Other Ambulatory Visit: Payer: Self-pay | Admitting: Family Medicine

## 2013-08-18 ENCOUNTER — Encounter (INDEPENDENT_AMBULATORY_CARE_PROVIDER_SITE_OTHER): Payer: Self-pay | Admitting: Surgery

## 2013-08-18 ENCOUNTER — Ambulatory Visit (INDEPENDENT_AMBULATORY_CARE_PROVIDER_SITE_OTHER): Payer: Medicare Other | Admitting: Surgery

## 2013-08-18 VITALS — BP 152/86 | HR 68 | Temp 98.5°F | Resp 18 | Ht 63.5 in | Wt 102.2 lb

## 2013-08-18 DIAGNOSIS — K3184 Gastroparesis: Secondary | ICD-10-CM

## 2013-08-18 NOTE — Patient Instructions (Signed)
Keep dressing over jejunostomy site until it stops draining.

## 2013-08-18 NOTE — Progress Notes (Signed)
[name] 63 y.o.  Body mass index is 17.82 kg/(m^2).  Patient Active Problem List   Diagnosis Date Noted  . Constipation, chronic 07/10/2013  . Chronic abdominal pain   . 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 Moderate malnutrition 05/13/2013  . COPD (chronic obstructive pulmonary disease) 11/07/2010  . DEPRESSION 06/06/2010  . LUNG NODULE 03/15/2010  . 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 WEIGHT LOSS 08/11/2009  . GASTRIC OUTLET OBSTRUCTION 08/28/2007  . CIGARETTE SMOKER 08/24/2007  . Gastroparesis 06/03/2007  . SYMPTOM, PAIN, ABDOMINAL, GENERALIZED 03/16/2007  . ANXIETY 01/07/2007  . BARRETT'S ESOPHAGUS, HX OF 07/03/2005    Allergies  Allergen Reactions  . Alprazolam     REACTION: unspecified  . Celebrex [Celecoxib] Nausea Only  . Morphine     REACTION: itching  . Quetiapine     REACTION: tardive dyskinesia  . Varenicline Tartrate     REACTION: unspecified    Past Surgical History  Procedure Laterality Date  . Esophagogastroduodenoscopy  12-29-09    dr qadeer at [facility], several gastric ulcers  . Vagotomy      lapaproscopic - [facility]  . 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 Hernia repair    . Perforated ulcer    . Biopsy thyroid  08-13-11    benign nodule, per Dr. Melida Quitter   . Esophagogastroduodenoscopy N/A 09/25/2012    Procedure: ESOPHAGOGASTRODUODENOSCOPY (EGD);  Surgeon: Beryle Beams, MD;  Location: [facility];  Service: Endoscopy;  Laterality: N/A;  . Fracture surgery Bilateral     hips with screw  . Tonsillectomy    . Eye surgery Bilateral     cataract extraction with IOL  . Laparoscopic partial gastrectomy N/A 06/29/2013    Procedure: Gastrectomy and GJ Tube placement;  Surgeon: Pedro Earls, MD;  Location: [facility];  Service: General;  Laterality: N/A;   [name], MD No diagnosis found.  Jejunostomy tube removed without difficulty.  Incision OK.  She says she is eating and drinking OK.  Weight is  stable.   Return 3 months.  [name], MD, [facility] [phone] beeper [phone]  08/18/2013 10:34 AM

## 2013-10-13 ENCOUNTER — Ambulatory Visit (INDEPENDENT_AMBULATORY_CARE_PROVIDER_SITE_OTHER): Payer: Medicare Other | Admitting: Surgery

## 2013-10-13 ENCOUNTER — Encounter (INDEPENDENT_AMBULATORY_CARE_PROVIDER_SITE_OTHER): Payer: Self-pay | Admitting: Surgery

## 2013-10-13 VITALS — BP 112/80 | HR 72 | Temp 97.4°F | Resp 14 | Ht 63.5 in | Wt 95.0 lb

## 2013-10-13 DIAGNOSIS — E440 Moderate protein-calorie malnutrition: Secondary | ICD-10-CM

## 2013-10-13 NOTE — Patient Instructions (Signed)
Eat ad lib Try protein shakes

## 2013-10-13 NOTE — Progress Notes (Signed)
[name] 63 y.o.  Body mass index is 16.56 kg/(m^2).  Patient Active Problem List   Diagnosis Date Noted  . Constipation, chronic 07/10/2013  . Chronic abdominal pain   . 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 Moderate malnutrition 05/13/2013  . COPD (chronic obstructive pulmonary disease) 11/07/2010  . DEPRESSION 06/06/2010  . LUNG NODULE 03/15/2010  . 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 WEIGHT LOSS 08/11/2009  . GASTRIC OUTLET OBSTRUCTION 08/28/2007  . CIGARETTE SMOKER 08/24/2007  . Gastroparesis 06/03/2007  . SYMPTOM, PAIN, ABDOMINAL, GENERALIZED 03/16/2007  . ANXIETY 01/07/2007  . BARRETT'S ESOPHAGUS, HX OF 07/03/2005    Allergies  Allergen Reactions  . Alprazolam     REACTION: unspecified  . Celebrex [Celecoxib] Nausea Only  . Morphine     REACTION: itching  . Quetiapine     REACTION: tardive dyskinesia  . Varenicline Tartrate     REACTION: unspecified    Past Surgical History  Procedure Laterality Date  . Esophagogastroduodenoscopy  12-29-09    dr qadeer at [facility], several gastric ulcers  . Vagotomy      lapaproscopic - [facility]  . 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 Hernia repair    . Perforated ulcer    . Biopsy thyroid  08-13-11    benign nodule, per Dr. Melida Quitter   . Esophagogastroduodenoscopy N/A 09/25/2012    Procedure: ESOPHAGOGASTRODUODENOSCOPY (EGD);  Surgeon: Beryle Beams, MD;  Location: [facility];  Service: Endoscopy;  Laterality: N/A;  . Fracture surgery Bilateral     hips with screw  . Tonsillectomy    . Eye surgery Bilateral     cataract extraction with IOL  . Laparoscopic partial gastrectomy N/A 06/29/2013    Procedure: Gastrectomy and GJ Tube placement;  Surgeon: Pedro Earls, MD;  Location: [facility];  Service: General;  Laterality: N/A;   [name], MD 1. Moderate malnutrition     Doesn't want to eat.  Not eating much.  Long discussion about increasing intake.  She does not get  nauseated or vomit.  She just doesn't want to eat.   I have encouraged her to eat ad lib.   Return 5 months.  [name], MD, [facility] [phone] beeper [phone]  10/13/2013 11:20 AM

## 2013-11-08 ENCOUNTER — Ambulatory Visit (INDEPENDENT_AMBULATORY_CARE_PROVIDER_SITE_OTHER): Payer: Medicare Other | Admitting: Family Medicine

## 2013-11-08 VITALS — BP 140/82 | HR 64 | Temp 98.4°F | Resp 18 | Ht 64.0 in | Wt 96.0 lb

## 2013-11-08 DIAGNOSIS — R1013 Epigastric pain: Secondary | ICD-10-CM

## 2013-11-08 DIAGNOSIS — F411 Generalized anxiety disorder: Secondary | ICD-10-CM

## 2013-11-08 DIAGNOSIS — G8929 Other chronic pain: Secondary | ICD-10-CM

## 2013-11-08 MED ORDER — ALPRAZOLAM 0.25 MG PO TABS: ORAL_TABLET | ORAL | Status: DC

## 2013-11-08 NOTE — Patient Instructions (Signed)
[facility] Policy for Prescribing Controlled Substances (Revised 04/2012) 1. Prescriptions for controlled substances will be filled by ONE provider at [facility] with whom you have established and developed a plan for your care, including follow-up. 2. You are encouraged to schedule an appointment with your prescriber at our appointment center for follow-up visits whenever possible. 3. If you request a prescription for the controlled substance while at [facility] for an acute problem (with someone other than your regular prescriber), you MAY be given a ONE-TIME prescription for a 30-day supply of the controlled substance, to allow time for you to return to see your regular prescriber for additional prescriptions. 

## 2013-11-08 NOTE — Progress Notes (Signed)
Chief Complaint:  Chief Complaint  Patient presents with  . Abdominal Pain    hx of stomach issues   . Anxiety    moms had heart attack and stoke     HPI: [name] is a 63 y.o. female who is here for chronic abd pain worsen recently by stressors at ome, mom has a recent stroke. Patient has  a h/o gastroparesisis, tube feeding and also outlet obstruction, she has abd pain intermittent, in the center, she has had similar pain after the surgery. THis does snot feel like her post curgical abd pain. She was helped by hydrocodone. She has some nausea. She has had BM, last BM this morning. HAs been passing gas.  Mom had a stroke, occurred on Thursday or Friday, she is not very good shape and patient is worried and her stomach is knots from it.  She has had abd pain for the last 10 years, she currently does not have abd pain but if she does it is 8/10, related to worrying about her mom. She has no appetitie. HAs been able to eat without any problems. She deneis SI/HI/hallucintations  Past Medical History  Diagnosis Date  . Peptic ulcer disease     dr Oletta Lamas  . Gastric outlet obstruction   . Acute duodenal ulcer with hemorrhage and perforation, with obstruction   . Barrett's esophagus   . Menopause   . GERD (gastroesophageal reflux disease)   . Asthma   . Chronic abdominal pain     narcotic dependence, dr gyarteng-dak at [facility]  . Narcotic abuse   . Anxiety     sees [name] NP at Dr. Radonna Ricker office  . Fx two ribs-open 08-24-11    left 4th and 5th  . Fx of fibula 07-28-11    left fibula, 2 places   . COPD (chronic obstructive pulmonary disease)   . S/P ORIF (open reduction internal fixation) fracture     both hips pinned  . Allergy   . Anemia   . History of blood transfusion   . Lap Nissen + truncal vagotomy July 2008 05/13/2013  . LEG EDEMA 01/19/2008    Qualifier: Diagnosis of  By: [name] MD, [name]   . ACUTE DUODEN ULCER W/HEMORR PERF&OBSTRUCTION  06/26/2004    Qualifier: Diagnosis of  By: [name] MD, [name]   . FEVER, RECURRENT 08/11/2009    Qualifier: Diagnosis of  By: [name] MD, [name] MENOPAUSE 01/07/2007    Qualifier: Diagnosis of  By: [name], CMA, [place]    . Pneumonia, organism unspecified 11/07/2010    Assoc with R Parapneumonic effusion 09/2010    - Tapped 10/05/10    - CxR resolved 11/07/2010     Past Surgical History  Procedure Laterality Date  . Esophagogastroduodenoscopy  12-29-09    dr qadeer at [facility], several gastric ulcers  . Vagotomy      lapaproscopic - [facility]  . 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 Hernia repair    . Perforated ulcer    . Biopsy thyroid  08-13-11    benign nodule, per Dr. Melida Quitter   . Esophagogastroduodenoscopy N/A 09/25/2012    Procedure: ESOPHAGOGASTRODUODENOSCOPY (EGD);  Surgeon: Beryle Beams, MD;  Location: [facility];  Service: Endoscopy;  Laterality: N/A;  . Fracture surgery Bilateral     hips with screw  . Tonsillectomy    . Eye surgery Bilateral  cataract extraction with IOL  . Laparoscopic partial gastrectomy N/A 06/29/2013    Procedure: Gastrectomy and GJ Tube placement;  Surgeon: Pedro Earls, MD;  Location: [facility];  Service: General;  Laterality: N/A;   History   Social History  . Marital Status: Divorced    Spouse Name: N/A    Number of Children: N/A  . Years of Education: N/A   Social History Main Topics  . Smoking status: Current Every Day Smoker -- 2.00 packs/day for 45 years    Types: Cigarettes  . Smokeless tobacco: Never Used  . Alcohol Use: No  . Drug Use: No  . Sexual Activity: None   Other Topics Concern  . None   Social History Narrative  . None   Family History  Problem Relation Age of Onset  . Cancer Mother     kidney, magliant tumor on face  . Stroke Mother   . Celiac disease Father   . Cancer Father     kidney and pancreatic   Allergies  Allergen Reactions  . Celebrex [Celecoxib] Nausea Only  . Morphine      REACTION: itching  . Quetiapine     REACTION: tardive dyskinesia  . Varenicline Tartrate     REACTION: unspecified   Prior to Admission medications   Medication Sig Start Date End Date Taking? Authorizing Provider  FLUoxetine (PROZAC) 40 MG capsule Take 40 mg by mouth 2 (two) times daily.   Yes Historical Provider, MD  Multiple Vitamin (MULTIVITAMIN WITH MINERALS) TABS tablet Take 1 tablet by mouth daily.   Yes Historical Provider, MD  omeprazole (PRILOSEC) 40 MG capsule Take 1 capsule (40 mg total) by mouth 2 (two) times daily. 10/09/11  Yes [name], MD  promethazine (PHENERGAN) 25 MG suppository INSERT 1 SUPPOSITORY RECTALLY EVERY 4 TO 6 HOURS   Yes [name], MD  ALPRAZolam [name]) 0.25 MG tablet  07/21/13   Historical Provider, MD  ferrous sulfate 325 (65 FE) MG tablet Take 325 mg by mouth 2 (two) times daily with a meal.    Historical Provider, MD     ROS: The patient denies fevers, chills, night sweats, unintentional weight loss, chest pain, palpitations, wheezing, dyspnea on exertion, vomiting, abdominal pain, dysuria, hematuria, melena, numbness, weakness, or tingling.   All other systems have been reviewed and were otherwise negative with the exception of those mentioned in the HPI and as above.    PHYSICAL EXAM: Filed Vitals:   11/08/13 0910  BP: 140/82  Pulse: 64  Temp: 98.4 F (36.9 C)  Resp: 18   Filed Vitals:   11/08/13 0910  Height: 5\' 4"  (1.626 m)  Weight: 96 lb (43.545 kg)   Body mass index is 16.47 kg/(m^2).  General: Alert, no acute distress, thin , looks older than stated age.  HEENT:  Normocephalic, atraumatic, oropharynx patent. EOMI, PERRLA Cardiovascular:  Regular rate and rhythm, no rubs murmurs or gallops.  No Carotid bruits, radial pulse intact. No pedal edema.  Respiratory: Clear to auscultation bilaterally.  No wheezes, rales, or rhonchi.  No cyanosis, no use of accessory musculature GI: No organomegaly, abdomen is soft and non-tender,  positive bowel sounds.  No masses. Skin: No rashes. Neurologic: Facial musculature symmetric. Psychiatric: Patient is appropriate throughout our interaction. Lymphatic: No cervical lymphadenopathy Musculoskeletal: Gait intact.   LABS: Results for orders placed during the hospital encounter of 06/29/13  MRSA PCR SCREENING      Result Value Ref Range   MRSA by PCR  NEGATIVE  NEGATIVE  CBC      Result Value Ref Range   WBC 12.0 (*) 4.0 - 10.5 K/uL   RBC 3.28 (*) 3.87 - 5.11 MIL/uL   Hemoglobin 9.6 (*) 12.0 - 15.0 g/dL   HCT 30.4 (*) 36.0 - 46.0 %   MCV 92.7  78.0 - 100.0 fL   MCH 29.3  26.0 - 34.0 pg   MCHC 31.6  30.0 - 36.0 g/dL   RDW 14.4  11.5 - 15.5 %   Platelets 250  150 - 400 K/uL  CBC      Result Value Ref Range   WBC 11.9 (*) 4.0 - 10.5 K/uL   RBC 3.38 (*) 3.87 - 5.11 MIL/uL   Hemoglobin 9.7 (*) 12.0 - 15.0 g/dL   HCT 31.5 (*) 36.0 - 46.0 %   MCV 93.2  78.0 - 100.0 fL   MCH 28.7  26.0 - 34.0 pg   MCHC 30.8  30.0 - 36.0 g/dL   RDW 14.5  11.5 - 15.5 %   Platelets 272  150 - 400 K/uL  CREATININE, SERUM      Result Value Ref Range   Creatinine, Ser 0.65  0.50 - 1.10 mg/dL   GFR calc non Af Amer >90  >90 mL/min   GFR calc Af Amer >90  >90 mL/min  COMPREHENSIVE METABOLIC PANEL      Result Value Ref Range   Sodium 134 (*) 137 - 147 mEq/L   Potassium 4.5  3.7 - 5.3 mEq/L   Chloride 102  96 - 112 mEq/L   CO2 21  19 - 32 mEq/L   Glucose, Bld 241 (*) 70 - 99 mg/dL   BUN 9  6 - 23 mg/dL   Creatinine, Ser 0.59  0.50 - 1.10 mg/dL   Calcium 8.2 (*) 8.4 - 10.5 mg/dL   Total Protein 5.1 (*) 6.0 - 8.3 g/dL   Albumin 2.6 (*) 3.5 - 5.2 g/dL   AST 50 (*) 0 - 37 U/L   ALT 33  0 - 35 U/L   Alkaline Phosphatase 53  39 - 117 U/L   Total Bilirubin <0.2 (*) 0.3 - 1.2 mg/dL   GFR calc non Af Amer >90  >90 mL/min   GFR calc Af Amer >90  >90 mL/min  CBC      Result Value Ref Range   WBC 9.1  4.0 - 10.5 K/uL   RBC 3.19 (*) 3.87 - 5.11 MIL/uL   Hemoglobin 9.3 (*) 12.0 - 15.0 g/dL    HCT 30.4 (*) 36.0 - 46.0 %   MCV 95.3  78.0 - 100.0 fL   MCH 29.2  26.0 - 34.0 pg   MCHC 30.6  30.0 - 36.0 g/dL   RDW 14.8  11.5 - 15.5 %   Platelets 275  150 - 400 K/uL  BASIC METABOLIC PANEL      Result Value Ref Range   Sodium 139  137 - 147 mEq/L   Potassium 4.6  3.7 - 5.3 mEq/L   Chloride 104  96 - 112 mEq/L   CO2 26  19 - 32 mEq/L   Glucose, Bld 113 (*) 70 - 99 mg/dL   BUN 7  6 - 23 mg/dL   Creatinine, Ser 0.64  0.50 - 1.10 mg/dL   Calcium 8.4  8.4 - 10.5 mg/dL   GFR calc non Af Amer >90  >90 mL/min   GFR calc Af Amer >90  >90 mL/min  GLUCOSE, CAPILLARY  Result Value Ref Range   Glucose-Capillary 106 (*) 70 - 99 mg/dL  GLUCOSE, CAPILLARY      Result Value Ref Range   Glucose-Capillary 114 (*) 70 - 99 mg/dL  GLUCOSE, CAPILLARY      Result Value Ref Range   Glucose-Capillary 134 (*) 70 - 99 mg/dL  GLUCOSE, CAPILLARY      Result Value Ref Range   Glucose-Capillary 113 (*) 70 - 99 mg/dL  GLUCOSE, CAPILLARY      Result Value Ref Range   Glucose-Capillary 121 (*) 70 - 99 mg/dL  GLUCOSE, CAPILLARY      Result Value Ref Range   Glucose-Capillary 112 (*) 70 - 99 mg/dL  GLUCOSE, CAPILLARY      Result Value Ref Range   Glucose-Capillary 106 (*) 70 - 99 mg/dL  GLUCOSE, CAPILLARY      Result Value Ref Range   Glucose-Capillary 106 (*) 70 - 99 mg/dL  GLUCOSE, CAPILLARY      Result Value Ref Range   Glucose-Capillary 100 (*) 70 - 99 mg/dL  GLUCOSE, CAPILLARY      Result Value Ref Range   Glucose-Capillary 93  70 - 99 mg/dL  GLUCOSE, CAPILLARY      Result Value Ref Range   Glucose-Capillary 102 (*) 70 - 99 mg/dL   Comment 1 Notify RN     Comment 2 Documented in Chart    GLUCOSE, CAPILLARY      Result Value Ref Range   Glucose-Capillary 108 (*) 70 - 99 mg/dL   Comment 1 Notify RN     Comment 2 Documented in Chart       EKG/XRAY:   Primary read interpreted by Dr. Marin Comment at [facility].   ASSESSMENT/PLAN: Encounter Diagnoses  Name Primary?  Marland Kitchen Anxiety state, unspecified  Yes  . Abdominal pain, chronic, epigastric    Rx xanax  Declined to give patient any narcotics, abd pain is chronic, she also has a history of abd surgery, risk of SBO is high. Listed in problem list is narcotic abuse but she denies this.  Patient understands.  Abd pain mostl likely related  to stress. PRecautiosn for going to ER given. She is eating, passing gas, and has had BM.  F/u prn, narcotic profile pulled and was negative for abuse  Gross sideeffects, risk and benefits, and alternatives of medications d/w patient. Patient is aware that all medications have potential sideeffects and we are unable to predict every sideeffect or drug-drug interaction that may occur.  Glenford Bayley, DO 11/08/2013 9:53 AM

## 2013-11-25 ENCOUNTER — Telehealth (INDEPENDENT_AMBULATORY_CARE_PROVIDER_SITE_OTHER): Payer: Self-pay | Admitting: General Surgery

## 2013-11-25 NOTE — Telephone Encounter (Signed)
Pt called to report she is still losing weight, now down to 87 lbs.  (She was 95 lbs on 10/13/13 office visit.)  Discussed with Dr. Hassell Done, who indicated she could wait until next available appt on 12/17/13.  Made appt and informed pt; also encouraged her to keep eating, even when she has no appetite for food.  Pt admits she is still smoking and understands why she should stop.

## 2013-11-27 ENCOUNTER — Other Ambulatory Visit: Payer: Self-pay | Admitting: Family Medicine

## 2013-12-17 ENCOUNTER — Encounter (INDEPENDENT_AMBULATORY_CARE_PROVIDER_SITE_OTHER): Payer: Self-pay | Admitting: Surgery

## 2013-12-17 ENCOUNTER — Ambulatory Visit (INDEPENDENT_AMBULATORY_CARE_PROVIDER_SITE_OTHER): Payer: Medicare Other | Admitting: Surgery

## 2013-12-17 VITALS — BP 110/60 | HR 88 | Temp 98.1°F | Resp 14 | Ht 63.0 in | Wt 92.6 lb

## 2013-12-17 DIAGNOSIS — R1310 Dysphagia, unspecified: Secondary | ICD-10-CM

## 2013-12-17 MED ORDER — ALPRAZOLAM 0.25 MG PO TABS: 0.2500 mg | ORAL_TABLET | Freq: Every evening | ORAL | Status: DC | PRN

## 2013-12-17 MED ORDER — HYDROCODONE-ACETAMINOPHEN 7.5-325 MG/15ML PO SOLN: 10.0000 mL | Freq: Four times a day (QID) | ORAL | Status: DC | PRN

## 2013-12-17 NOTE — Progress Notes (Signed)
[name] 63 y.o.  Body mass index is 16.41 kg/(m^2).  Patient Active Problem List   Diagnosis Date Noted  . Constipation, chronic 07/10/2013  . Chronic abdominal pain   . 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 Moderate malnutrition 05/13/2013  . COPD (chronic obstructive pulmonary disease) 11/07/2010  . DEPRESSION 06/06/2010  . LUNG NODULE 03/15/2010  . 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 WEIGHT LOSS 08/11/2009  . GASTRIC OUTLET OBSTRUCTION 08/28/2007  . CIGARETTE SMOKER 08/24/2007  . Gastroparesis 06/03/2007  . SYMPTOM, PAIN, ABDOMINAL, GENERALIZED 03/16/2007  . ANXIETY 01/07/2007  . BARRETT'S ESOPHAGUS, HX OF 07/03/2005    Allergies  Allergen Reactions  . Celebrex [Celecoxib] Nausea Only  . Morphine     REACTION: itching  . Quetiapine     REACTION: tardive dyskinesia  . Varenicline Tartrate     REACTION: unspecified      Past Surgical History  Procedure Laterality Date  . Esophagogastroduodenoscopy  12-29-09    dr qadeer at [facility], several gastric ulcers  . Vagotomy      lapaproscopic - [facility]  . 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 Hernia repair    . Perforated ulcer    . Biopsy thyroid  08-13-11    benign nodule, per Dr. Melida Quitter   . Esophagogastroduodenoscopy N/A 09/25/2012    Procedure: ESOPHAGOGASTRODUODENOSCOPY (EGD);  Surgeon: Beryle Beams, MD;  Location: [facility];  Service: Endoscopy;  Laterality: N/A;  . Fracture surgery Bilateral     hips with screw  . Tonsillectomy    . Eye surgery Bilateral     cataract extraction with IOL  . Laparoscopic partial gastrectomy N/A 06/29/2013    Procedure: Gastrectomy and GJ Tube placement;  Surgeon: Pedro Earls, MD;  Location: [facility];  Service: General;  Laterality: N/A;   [name], MD 1. Dysphagia, unspecified(787.20)     [name]'s has lost down to 92.6 lbs.  She says that she cannot eat but she has a history of resisting feeding tube placement and  removing the J tube that I placed after surgery.   Will get an UGI to look at motility and 62 year old Nissen.  She wanted something for her nerves( her mother had a bad stroke and is in SNF) and something for pain.  Obliged..Xanax and Percocet.  Will see back after UGI.  She is taking Gummy Bears for vitamins.    [name], MD, [facility] [phone] beeper [phone]  12/17/2013 3:44 PM

## 2013-12-22 ENCOUNTER — Ambulatory Visit
Admission: RE | Admit: 2013-12-22 | Discharge: 2013-12-22 | Disposition: A | Discharge status: Home / Self Care | Payer: Medicare Other | Source: Ambulatory Visit | Attending: Surgery | Admitting: Surgery

## 2013-12-22 DIAGNOSIS — R1310 Dysphagia, unspecified: Secondary | ICD-10-CM

## 2013-12-25 ENCOUNTER — Telehealth (INDEPENDENT_AMBULATORY_CARE_PROVIDER_SITE_OTHER): Payer: Self-pay | Admitting: Surgery

## 2013-12-25 NOTE — Telephone Encounter (Signed)
Pt called wanting results of UGI ordered by Dr Hassell Done.  These were reviewed with pt.  Pt concerned over weight loss.  No obstruction or emptying issues noted.  Pt reassured and told to contact Dr Hassell Done next week for next steps.  If she felt this was an emergency,  Care should be sought in ED.

## 2013-12-27 ENCOUNTER — Telehealth (INDEPENDENT_AMBULATORY_CARE_PROVIDER_SITE_OTHER): Payer: Self-pay

## 2013-12-27 NOTE — Telephone Encounter (Signed)
Patient emotional , states she still losing weight even thou she is eating . She is asking for a call from DR. Hassell Done , she may want to have a G-tube placed. I informed her I would let DR. Hassell Done know about her concerns

## 2013-12-28 ENCOUNTER — Telehealth (INDEPENDENT_AMBULATORY_CARE_PROVIDER_SITE_OTHER): Payer: Self-pay

## 2013-12-28 NOTE — Telephone Encounter (Signed)
Spoke to patient today, scheduled office visit for 12/30/13 w/Dr. Hassell Done.

## 2013-12-28 NOTE — Telephone Encounter (Signed)
Spoke to patient regarding her concerns.  Patient scheduled for follow up appointment on 12/30/13 @ 11:50am with Dr. Hassell Done to discuss UGI results.  Patient states she needs a refill on her pain medication and xanax.  Patient will wait until her appointment to discuss refills with Dr. Hassell Done.

## 2013-12-30 ENCOUNTER — Encounter (HOSPITAL_COMMUNITY): Payer: Self-pay | Admitting: Surgery

## 2013-12-30 ENCOUNTER — Ambulatory Visit (INDEPENDENT_AMBULATORY_CARE_PROVIDER_SITE_OTHER): Payer: Medicare Other | Admitting: Surgery

## 2013-12-30 ENCOUNTER — Inpatient Hospital Stay (HOSPITAL_COMMUNITY)
Admission: AD | Admit: 2013-12-30 | Discharge: 2014-01-05 | DRG: 641 | Disposition: A | Discharge status: Home / Self Care | Payer: Medicare Other | Source: Ambulatory Visit | Attending: Surgery | Admitting: Surgery

## 2013-12-30 ENCOUNTER — Encounter (INDEPENDENT_AMBULATORY_CARE_PROVIDER_SITE_OTHER): Payer: Self-pay | Admitting: Surgery

## 2013-12-30 VITALS — BP 110/70 | HR 106 | Temp 98.7°F | Ht 63.0 in | Wt 90.0 lb

## 2013-12-30 DIAGNOSIS — Z823 Family history of stroke: Secondary | ICD-10-CM

## 2013-12-30 DIAGNOSIS — K219 Gastro-esophageal reflux disease without esophagitis: Secondary | ICD-10-CM | POA: Diagnosis present

## 2013-12-30 DIAGNOSIS — K2270 Barrett's esophagus without dysplasia: Secondary | ICD-10-CM | POA: Diagnosis present

## 2013-12-30 DIAGNOSIS — F411 Generalized anxiety disorder: Secondary | ICD-10-CM | POA: Diagnosis present

## 2013-12-30 DIAGNOSIS — R634 Abnormal weight loss: Secondary | ICD-10-CM | POA: Diagnosis present

## 2013-12-30 DIAGNOSIS — Z79899 Other long term (current) drug therapy: Secondary | ICD-10-CM

## 2013-12-30 DIAGNOSIS — K277 Chronic peptic ulcer, site unspecified, without hemorrhage or perforation: Secondary | ICD-10-CM | POA: Diagnosis present

## 2013-12-30 DIAGNOSIS — F332 Major depressive disorder, recurrent severe without psychotic features: Secondary | ICD-10-CM | POA: Diagnosis present

## 2013-12-30 DIAGNOSIS — Z681 Body mass index (BMI) 19 or less, adult: Secondary | ICD-10-CM

## 2013-12-30 DIAGNOSIS — Z9884 Bariatric surgery status: Secondary | ICD-10-CM | POA: Diagnosis not present

## 2013-12-30 DIAGNOSIS — F17200 Nicotine dependence, unspecified, uncomplicated: Secondary | ICD-10-CM | POA: Diagnosis present

## 2013-12-30 DIAGNOSIS — J4489 Other specified chronic obstructive pulmonary disease: Secondary | ICD-10-CM | POA: Diagnosis present

## 2013-12-30 DIAGNOSIS — G8929 Other chronic pain: Secondary | ICD-10-CM | POA: Diagnosis present

## 2013-12-30 DIAGNOSIS — J449 Chronic obstructive pulmonary disease, unspecified: Secondary | ICD-10-CM | POA: Diagnosis present

## 2013-12-30 DIAGNOSIS — E43 Unspecified severe protein-calorie malnutrition: Principal | ICD-10-CM | POA: Diagnosis present

## 2013-12-30 DIAGNOSIS — Z8051 Family history of malignant neoplasm of kidney: Secondary | ICD-10-CM

## 2013-12-30 DIAGNOSIS — F39 Unspecified mood [affective] disorder: Secondary | ICD-10-CM | POA: Diagnosis present

## 2013-12-30 DIAGNOSIS — R109 Unspecified abdominal pain: Secondary | ICD-10-CM | POA: Diagnosis present

## 2013-12-30 DIAGNOSIS — F331 Major depressive disorder, recurrent, moderate: Secondary | ICD-10-CM | POA: Diagnosis present

## 2013-12-30 HISTORY — DX: Adult hypertrophic pyloric stenosis: K31.1

## 2013-12-30 HISTORY — DX: Chronic duodenal ulcer without hemorrhage or perforation: K26.7

## 2013-12-30 HISTORY — DX: Gastroparesis: K31.84

## 2013-12-30 LAB — CBC WITH DIFFERENTIAL/PLATELET
BASOS ABS: 0.1 10*3/uL (ref 0.0–0.1)
Basophils Relative: 1 % (ref 0–1)
Eosinophils Absolute: 0.1 10*3/uL (ref 0.0–0.7)
Eosinophils Relative: 2 % (ref 0–5)
HCT: 35.3 % — ABNORMAL LOW (ref 36.0–46.0)
Hemoglobin: 11.8 g/dL — ABNORMAL LOW (ref 12.0–15.0)
LYMPHS PCT: 27 % (ref 12–46)
Lymphs Abs: 1.9 10*3/uL (ref 0.7–4.0)
MCH: 30.1 pg (ref 26.0–34.0)
MCHC: 33.4 g/dL (ref 30.0–36.0)
MCV: 90.1 fL (ref 78.0–100.0)
Monocytes Absolute: 0.6 10*3/uL (ref 0.1–1.0)
Monocytes Relative: 8 % (ref 3–12)
NEUTROS ABS: 4.5 10*3/uL (ref 1.7–7.7)
Neutrophils Relative %: 62 % (ref 43–77)
PLATELETS: 271 10*3/uL (ref 150–400)
RBC: 3.92 MIL/uL (ref 3.87–5.11)
RDW: 15 % (ref 11.5–15.5)
WBC: 7.1 10*3/uL (ref 4.0–10.5)

## 2013-12-30 LAB — COMPREHENSIVE METABOLIC PANEL
ALT: 8 U/L (ref 0–35)
AST: 15 U/L (ref 0–37)
Albumin: 3.6 g/dL (ref 3.5–5.2)
Alkaline Phosphatase: 80 U/L (ref 39–117)
Anion gap: 14 (ref 5–15)
BUN: 11 mg/dL (ref 6–23)
CHLORIDE: 100 meq/L (ref 96–112)
CO2: 23 meq/L (ref 19–32)
Calcium: 9.3 mg/dL (ref 8.4–10.5)
Creatinine, Ser: 0.75 mg/dL (ref 0.50–1.10)
GFR calc Af Amer: >90 mL/min (ref >90)
GFR, EST NON AFRICAN AMERICAN: 89 mL/min — AB (ref >90)
Glucose, Bld: 115 mg/dL — ABNORMAL HIGH (ref 70–99)
Potassium: 3.7 mEq/L (ref 3.7–5.3)
SODIUM: 137 meq/L (ref 137–147)
Total Protein: 6.5 g/dL (ref 6.0–8.3)

## 2013-12-30 LAB — TSH: TSH: 2.43 u[IU]/mL (ref 0.350–4.500)

## 2013-12-30 LAB — PHOSPHORUS: Phosphorus: 3.2 mg/dL (ref 2.3–4.6)

## 2013-12-30 LAB — MAGNESIUM: Magnesium: 2.2 mg/dL (ref 1.5–2.5)

## 2013-12-30 MED ORDER — ENSURE COMPLETE PO LIQD
237.0000 mL | Freq: Three times a day (TID) | ORAL | Status: DC
  Administered 2013-12-30 – 2013-12-31 (×4): 237 mL via ORAL

## 2013-12-30 MED ORDER — HEPARIN SODIUM (PORCINE) 5000 UNIT/ML IJ SOLN
5000.0000 [IU] | Freq: Three times a day (TID) | INTRAMUSCULAR | Status: DC
Start: 2013-12-30 — End: 2014-01-05
  Administered 2013-12-30 – 2014-01-04 (×15): 5000 [IU] via SUBCUTANEOUS
  Filled 2013-12-30 (×21): qty 1

## 2013-12-30 MED ORDER — NICOTINE 7 MG/24HR TD PT24
7.0000 mg | MEDICATED_PATCH | Freq: Every day | TRANSDERMAL | Status: DC
  Administered 2013-12-30 – 2013-12-31 (×2): 7 mg via TRANSDERMAL
  Filled 2013-12-30 (×2): qty 1

## 2013-12-30 MED ORDER — KCL IN DEXTROSE-NACL 20-5-0.45 MEQ/L-%-% IV SOLN
INTRAVENOUS | Status: DC
  Administered 2013-12-30 – 2014-01-05 (×9): via INTRAVENOUS
  Filled 2013-12-30 (×15): qty 1000

## 2013-12-30 NOTE — Progress Notes (Signed)
Please hang calorie count envelope on the patient's door. Document percent consumed for each item on the patient's meal tray ticket and keep in envelope. Also document percent of any supplement or snack pt consumes and keep documentation in envelope for RD to review.    RD to follow up on 7/10  Atlee Abide [place] Clinical Dietitian [phone]

## 2013-12-30 NOTE — Progress Notes (Signed)
When pt arrived from home to floor, she stated that she was in "pain all over" 7/10.  Called Dr. Hassell Done for order for pain medicine.  He stated he did not want her to have any medication to keep her from eating because the main reason she is being admitted is due to malnutrition.

## 2013-12-30 NOTE — Progress Notes (Signed)
Chief Complaint:  Severe protein calorie malnutrition  History of Present Illness:  [name] is an 63 y.o. 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  chronic peptic ulcer disease without with obstruction and gastric stasis.  She has struggled as she insisted on her feeding tube removal. Recently her mother has had a stroke. She is an only child and I think she will lie upon her mother a lot to help her with eating. She has continued to not eat and today comes in with a weight of 90 pounds height 5 foot 3 inch frame. She has severe protein calorie malnutrition and feels like she is willing to die.  I have no other alternative but to admit her for forced feedings and calorie counts.     Past Medical History  Diagnosis Date  . Peptic ulcer disease     dr Oletta Lamas  . Gastric outlet obstruction   . Acute duodenal ulcer with hemorrhage and perforation, with obstruction   . Barrett's esophagus   . Menopause   . GERD (gastroesophageal reflux disease)   . Asthma   . Chronic abdominal pain     narcotic dependence, dr gyarteng-dak at [facility]  . Narcotic abuse   . Anxiety     sees [name] NP at Dr. Radonna Ricker office  . Fx two ribs-open 08-24-11    left 4th and 5th  . Fx of fibula 07-28-11    left fibula, 2 places   . COPD (chronic obstructive pulmonary disease)   . S/P ORIF (open reduction internal fixation) fracture     both hips pinned  . Allergy   . Anemia   . History of blood transfusion   . Lap Nissen + truncal vagotomy July 2008 05/13/2013  . LEG EDEMA 01/19/2008    Qualifier: Diagnosis of  By: [name] MD, [name]   . ACUTE DUODEN ULCER W/HEMORR PERF&OBSTRUCTION 06/26/2004    Qualifier: Diagnosis of  By: [name] MD, [name]   . FEVER, RECURRENT 08/11/2009    Qualifier: Diagnosis of  By: [name] MD, [name] MENOPAUSE 01/07/2007    Qualifier:  Diagnosis of  By: [name], CMA, [place]    . Pneumonia, organism unspecified 11/07/2010    Assoc with R Parapneumonic effusion 09/2010    - Tapped 10/05/10    - CxR resolved 11/07/2010      Past Surgical History  Procedure Laterality Date  . Esophagogastroduodenoscopy  12-29-09    dr qadeer at [facility], several gastric ulcers  . Vagotomy      lapaproscopic - nissan  . 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 Hernia repair    . Perforated ulcer    . Biopsy thyroid  08-13-11    benign nodule, per Dr. Melida Quitter   . Esophagogastroduodenoscopy N/A 09/25/2012    Procedure: ESOPHAGOGASTRODUODENOSCOPY (EGD);  Surgeon: Beryle Beams, MD;  Location: [facility];  Service: Endoscopy;  Laterality: N/A;  . Fracture surgery Bilateral     hips with screw  . Tonsillectomy    . Eye surgery Bilateral     cataract extraction with IOL  . Laparoscopic partial gastrectomy N/A 06/29/2013    Procedure: Gastrectomy and GJ Tube placement;  Surgeon: Pedro Earls, MD;  Location: [facility];  Service: General;  Laterality: N/A;    Current Outpatient Prescriptions  Medication Sig Dispense  Refill  . ALPRAZolam (XANAX) 0.25 MG tablet Take 1 tablet (0.25 mg total) by mouth at bedtime as needed for anxiety.  30 tablet  0  . FLUoxetine (PROZAC) 40 MG capsule Take 40 mg by mouth 2 (two) times daily.      Marland Kitchen FLUoxetine (PROZAC) 40 MG capsule TAKE 2 CAPSULES BY MOUTH EVERY DAY  180 capsule  3  . Multiple Vitamin (MULTIVITAMIN WITH MINERALS) TABS tablet Take 1 tablet by mouth daily.      Marland Kitchen omeprazole (PRILOSEC) 40 MG capsule Take 1 capsule (40 mg total) by mouth 2 (two) times daily.  180 capsule  3  . promethazine (PHENERGAN) 25 MG suppository INSERT 1 SUPPOSITORY RECTALLY EVERY 4 TO 6 HOURS  60 suppository  3   No current facility-administered medications for this visit.   Celebrex; Morphine; Quetiapine; and Varenicline tartrate Family History  Problem Relation Age of Onset  . Cancer Mother     kidney, magliant  tumor on face  . Stroke Mother   . Celiac disease Father   . Cancer Father     kidney and pancreatic   Social History:   reports that she has been smoking Cigarettes.  She has a 90 pack-year smoking history. She has never used smokeless tobacco. She reports that she does not drink alcohol or use illicit drugs.   REVIEW OF SYSTEMS : It's complicated..still smoking?  Not eating.  Continued slide in weight  Physical Exam:   Blood pressure 110/70, pulse 106, temperature 98.7 F (37.1 C), height 5\' 3"  (1.6 m), weight 90 lb (40.824 kg). Body mass index is 15.95 kg/(m^2).  Gen:  Emaciated white female  Neurological: Alert and oriented to person, place, and time. Motor and sensory function is grossly intact  Head: Normocephalic and atraumatic.  Eyes: Conjunctivae are normal. Pupils are equal, round, and reactive to light. No scleral icterus.  Neck: Normal range of motion. Neck supple. No tracheal deviation or thyromegaly present.  Cardiovascular:  SR without murmurs or gallops.  No carotid bruits Breast:  Not examined Respiratory: Effort normal.  No respiratory distress. No chest wall tenderness. Breath sounds normal.  No wheezes, rales or rhonchi.  Abdomen:  Thin with dark scars from chronic heating pad GU:  Not examined Musculoskeletal: Normal range of motion. Extremities are nontender. No cyanosis, edema or clubbing noted Lymphadenopathy: No cervical, preauricular, postauricular or axillary adenopathy is present Skin: Skin is warm and dry. No rash noted. No diaphoresis. No erythema. No pallor. Pscyh: Normal mood and affect. Behavior is normal. Judgment and thought content normal.   LABORATORY RESULTS: No results found for this or any previous visit (from the past 48 hour(s)).   RADIOLOGY RESULTS: No results found.  Problem List: Patient Active Problem List   Diagnosis Date Noted  . Severe protein-calorie malnutrition 12/30/2013  . Constipation, chronic 07/10/2013  . Chronic  abdominal pain   . 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 Moderate malnutrition 05/13/2013  . COPD (chronic obstructive pulmonary disease) 11/07/2010  . DEPRESSION 06/06/2010  . LUNG NODULE 03/15/2010  . 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 WEIGHT LOSS 08/11/2009  . GASTRIC OUTLET OBSTRUCTION 08/28/2007  . CIGARETTE SMOKER 08/24/2007  . Gastroparesis 06/03/2007  . SYMPTOM, PAIN, ABDOMINAL, GENERALIZED 03/16/2007  . ANXIETY 01/07/2007  . BARRETT'S ESOPHAGUS, HX OF 07/03/2005    Assessment & Plan: Severe protein calorie malnutrition.  Plan admission for supervised calorie consumption and possible replacement of feeding tube.     [name], MD,  [facility] [phone] beeper [phone]  12/30/2013 12:54 PM

## 2013-12-30 NOTE — Progress Notes (Signed)
INITIAL NUTRITION ASSESSMENT  DOCUMENTATION CODES Per approved criteria  -Severe  malnutrition in the context of social or environmental circumstances  Pt meets criteria for severe MALNUTRITION in the context of social circumstances as evidenced by PO intake < 75% for > three months, 14% body weight loss in 6 months, severe muscle wasting and subcutaneous fat loss.   INTERVENTION: - Recommend Ensure Complete po TID, each supplement provides 350 kcal and 13 grams of protein - Recommend  2PM and 8PM nourishment - 48 hour Calorie Count - Envelope hung - Will continue to monitor  NUTRITION DIAGNOSIS: Inadequate oral intake related to early satiety/mental status as evidenced by PO intake <75%, 14% body weight loss in 6 months.   Goal: Pt to meet >/= 90% of their estimated nutrition needs    Monitor:  Total protein/energy intake, labs, weights, GI profile  Reason for Assessment: Consult/Calorie Count  63 y.o. female  Admitting Dx: Severe protein-calorie malnutrition  ASSESSMENT: RUTA CAPECE is an 63 y.o. female who underwent laparoscopy with enteral lysis, open subtotal gastrectomy and conversion to Roux-en-Y gastrojejunostomy and jejunostomy tube placement in Jan 2015 for .  chronic peptic ulcer disease without with obstruction and gastric stasis. She has struggled as she insisted on her feeding tube removal. Recently her mother has had a stroke. She is an only child and I think she will lie upon her mother a lot to help her with eating.   -Pt reported ongoing difficulty with gaining weight. Endorsed feelings of overall early satiety that inhibit PO intake. Has hx of gastroparesis to which pt attributes the early satiety to. Diet recall indicates pt consuming < two meals/day, will eat 50-75% of a fast food breakfast biscuit and often makes spaghetti dish for dinner -Pt will drink Ensure Complete occasionally, likely < one supplement/day. Noted she used to snack more frequently on  chips, ice cream; however has recently lost motivation and finds it difficulty to maintain regular meal and snack schedule. Encouraged pt to consume 5-6 smalll meals/snacks for weight gain. Pt verbalized understanding, seemed apprehensive and discouraged at current hospitalization. -Pt has lost 15 lbs since 06/2013 (14% body weight, severe for time frame) -Discussed addition of Ensure Complete TID and snacks BID.Promoted high kcal/protein foods. Pt willing to try cheese and crackers, and ice cream for snack options -Initiated Calorie Count. Envelope is hung. RD to follow up post lunch meals on 7/10.  -Pt with evident severe muscle wasting and subcutaneous fat loss in temporal, upper arm, and clavicle region. Lower extremities and thoracic areas not assessed   Height: Ht Readings from Last 1 Encounters:  12/30/13 5\' 3"  (1.6 m)    Weight: Wt Readings from Last 1 Encounters:  12/30/13 90 lb (40.824 kg)    Ideal Body Weight: 115 lbs  % Ideal Body Weight: 78%  Wt Readings from Last 10 Encounters:  12/30/13 90 lb (40.824 kg)  12/17/13 92 lb 9.6 oz (42.003 kg)  11/08/13 96 lb (43.545 kg)  10/13/13 95 lb (43.092 kg)  08/18/13 102 lb 3.2 oz (46.358 kg)  07/21/13 99 lb 12.8 oz (45.269 kg)  07/13/13 104 lb 11.5 oz (47.5 kg)  07/13/13 104 lb 11.5 oz (47.5 kg)  06/23/13 106 lb (48.081 kg)  06/11/13 105 lb 6.4 oz (47.809 kg)    Usual Body Weight: 105 lbs  % Usual Body Weight: 85%  BMI:  There is no weight on file to calculate BMI.  Estimated Nutritional Needs: Kcal: 1400-1600 Protein: 60-75 gram Fluid: >/=1400 ml/daily  Skin: WDL  Diet Order: General  EDUCATION NEEDS: -Education needs addressed  No intake or output data in the 24 hours ending 12/30/13 1553  Last BM: 7/09   Labs:   Recent Labs Lab 12/30/13 1430  NA 137  K 3.7  CL 100  CO2 23  BUN 11  CREATININE 0.75  CALCIUM 9.3  MG 2.2  PHOS 3.2  GLUCOSE 115*    CBG (last 3)  No results found for this  basename: GLUCAP,  in the last 72 hours  Scheduled Meds: . feeding supplement (ENSURE COMPLETE)  237 mL Oral TID WC  . heparin  5,000 Units Subcutaneous 3 times per day  . nicotine  7 mg Transdermal Daily    Continuous Infusions: . dextrose 5 % and 0.45 % NaCl with KCl 20 mEq/L 100 mL/hr at 12/30/13 1525    Past Medical History  Diagnosis Date  . Peptic ulcer disease     dr Oletta Lamas  . Gastric outlet obstruction   . Acute duodenal ulcer with hemorrhage and perforation, with obstruction   . Barrett's esophagus   . Menopause   . GERD (gastroesophageal reflux disease)   . Asthma   . Chronic abdominal pain     narcotic dependence, dr gyarteng-dak at [facility]  . Narcotic abuse   . Anxiety     sees [name] NP at Dr. Radonna Ricker office  . Fx two ribs-open 08-24-11    left 4th and 5th  . Fx of fibula 07-28-11    left fibula, 2 places   . COPD (chronic obstructive pulmonary disease)   . S/P ORIF (open reduction internal fixation) fracture     both hips pinned  . Allergy   . Anemia   . History of blood transfusion   . Lap Nissen + truncal vagotomy July 2008 05/13/2013  . LEG EDEMA 01/19/2008    Qualifier: Diagnosis of  By: [name] MD, [name]   . ACUTE DUODEN ULCER W/HEMORR PERF&OBSTRUCTION 06/26/2004    Qualifier: Diagnosis of  By: [name] MD, [name]   . FEVER, RECURRENT 08/11/2009    Qualifier: Diagnosis of  By: [name] MD, [name] MENOPAUSE 01/07/2007    Qualifier: Diagnosis of  By: [name], CMA, [place]    . Pneumonia, organism unspecified 11/07/2010    Assoc with R Parapneumonic effusion 09/2010    - Tapped 10/05/10    - CxR resolved 11/07/2010    . Gastroparesis 06/03/2007    Qualifier: Diagnosis of  By: [name] MD, [name]   . GASTRIC OUTLET OBSTRUCTION 08/28/2007    In July 2008 she underwent laparoscopic enterolysis, Nissen fundoplication over a #60 bougie, single pledgeted suture colosure of the hiatus and a 3 suture wrap.  Lap truncal vagotomy and a loop gastrojejunostomy  was performed.  This was revised in August of 2009 to a roux en Y gastrojejujostomy.     . Chronic duodenal ulcer with gastric outlet obstruction 06/29/2013    Past Surgical History  Procedure Laterality Date  . Esophagogastroduodenoscopy  12-29-09    dr qadeer at [facility], several gastric ulcers  . Vagotomy      lapaproscopic - nissan  . 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 Hernia repair    . Perforated ulcer    . Biopsy thyroid  08-13-11    benign nodule, per Dr. Melida Quitter   . Esophagogastroduodenoscopy N/A 09/25/2012    Procedure: ESOPHAGOGASTRODUODENOSCOPY (EGD);  Surgeon: Tory Emerald  Benson Norway, MD;  Location: [facility];  Service: Endoscopy;  Laterality: N/A;  . Fracture surgery Bilateral     hips with screw  . Tonsillectomy    . Eye surgery Bilateral     cataract extraction with IOL  . Laparoscopic partial gastrectomy N/A 06/29/2013    Procedure: Gastrectomy and GJ Tube placement;  Surgeon: Pedro Earls, MD;  Location: [facility];  Service: General;  Laterality: N/A;    Atlee Abide MS RD LDN Clinical Dietitian HQION:[phone]

## 2013-12-31 DIAGNOSIS — E43 Unspecified severe protein-calorie malnutrition: Secondary | ICD-10-CM

## 2013-12-31 DIAGNOSIS — F332 Major depressive disorder, recurrent severe without psychotic features: Secondary | ICD-10-CM

## 2013-12-31 LAB — CBC
HCT: 31 % — ABNORMAL LOW (ref 36.0–46.0)
Hemoglobin: 10.1 g/dL — ABNORMAL LOW (ref 12.0–15.0)
MCH: 30.1 pg (ref 26.0–34.0)
MCHC: 32.6 g/dL (ref 30.0–36.0)
MCV: 92.5 fL (ref 78.0–100.0)
Platelets: 222 10*3/uL (ref 150–400)
RBC: 3.35 MIL/uL — AB (ref 3.87–5.11)
RDW: 15.3 % (ref 11.5–15.5)
WBC: 4.6 10*3/uL (ref 4.0–10.5)

## 2013-12-31 LAB — COMPREHENSIVE METABOLIC PANEL
ALK PHOS: 63 U/L (ref 39–117)
ALT: 7 U/L (ref 0–35)
AST: 13 U/L (ref 0–37)
Albumin: 2.9 g/dL — ABNORMAL LOW (ref 3.5–5.2)
Anion gap: 9 (ref 5–15)
BUN: 7 mg/dL (ref 6–23)
CO2: 24 meq/L (ref 19–32)
CREATININE: 0.66 mg/dL (ref 0.50–1.10)
Calcium: 8.8 mg/dL (ref 8.4–10.5)
Chloride: 107 mEq/L (ref 96–112)
GFR calc Af Amer: >90 mL/min (ref >90)
Glucose, Bld: 91 mg/dL (ref 70–99)
POTASSIUM: 4.3 meq/L (ref 3.7–5.3)
Sodium: 140 mEq/L (ref 137–147)
Total Protein: 5.2 g/dL — ABNORMAL LOW (ref 6.0–8.3)

## 2013-12-31 LAB — HIV ANTIBODY (ROUTINE TESTING W REFLEX): HIV 1&2 Ab, 4th Generation: NONREACTIVE

## 2013-12-31 LAB — PREALBUMIN: Prealbumin: 27.3 mg/dL (ref 17.0–34.0)

## 2013-12-31 MED ORDER — RISPERIDONE 0.25 MG PO TABS
0.2500 mg | ORAL_TABLET | Freq: Two times a day (BID) | ORAL | Status: DC
  Administered 2013-12-31 – 2014-01-05 (×10): 0.25 mg via ORAL
  Filled 2013-12-31 (×11): qty 1

## 2013-12-31 MED ORDER — RISPERIDONE 0.5 MG PO TABS
0.5000 mg | ORAL_TABLET | Freq: Every day | ORAL | Status: DC
Start: 2013-12-31 — End: 2014-01-05
  Administered 2013-12-31 – 2014-01-04 (×5): 0.5 mg via ORAL
  Filled 2013-12-31 (×6): qty 1

## 2013-12-31 MED ORDER — ENSURE COMPLETE PO LIQD
237.0000 mL | Freq: Two times a day (BID) | ORAL | Status: DC
  Administered 2014-01-02 – 2014-01-03 (×3): 237 mL via ORAL

## 2013-12-31 MED ORDER — NICOTINE 14 MG/24HR TD PT24
14.0000 mg | MEDICATED_PATCH | Freq: Every day | TRANSDERMAL | Status: DC
  Administered 2013-12-31 – 2014-01-05 (×6): 14 mg via TRANSDERMAL
  Filled 2013-12-31 (×6): qty 1

## 2013-12-31 NOTE — Progress Notes (Signed)
Calorie Count Note  48 hour calorie count ordered. Will have weekend RD follow up with patient   Diet: Regular  Supplements: Ensure Complete TID, snacks BID  Breakfast: 1156 kcal, 53 gram protein Lunch: 468 kcal, 18 gram protein Dinner: 958 kcal, 36 gram protein Supplements: 930 kcal, 16 gram protein  (two Ensure Complete, crackers and sprite)  Total intake: 3512 kcal (>100% of minimum estimated needs)  123 gram protein (>100% of minimum estimated needs)  Nutrition Dx: Inadequate oral intake related to early satiety/mental status as evidenced by PO intake <75%, 14% body weight loss in 6 months - improving with hospitalization   Goal: Pt to meet >/= 90% of their estimated nutrition needs - improving with hospitalization  Assessment: -Pt very agitated and anxious during RD follow up r/t desire for cigarettes. Reported good appetite, and has been trying to complete all of her meals. Consuming two Ensure/day and has received crackers w/soda for snacks.  -Pt is meeting approximately 200% of estimated nutrition needs, and would result in weight gain of approximately 0.5-1 lb/week if current PO intake was able to be maintained; however RD concern is discharge needs and pt's ability to maintain nutritional status post hospitalization -MD also expressed possible need for Jtube for nocturnal feeds. Will continue calorie count and monitor nutrition support needs  Intervention:  -Will modify Ensure Complete to BID as pt refused third supplement and pt is consuming >75% of meals and receiving snacks -Continue with calorie count over weekened   [name] LDN Clinical Dietitian [phone]

## 2013-12-31 NOTE — Consult Note (Signed)
[facility] Face-to-Face Psychiatry Consult   Reason for Consult:  Depression and malnutrition Referring Physician:  Dr. Earnstine Regal [name] is an 63 y.o. female. Total Time spent with patient: 20 minutes  Assessment: AXIS I:  Major Depression, Recurrent severe AXIS II:  Deferred AXIS III:   Past Medical History  Diagnosis Date  . Peptic ulcer disease     dr Oletta Lamas  . Gastric outlet obstruction   . Acute duodenal ulcer with hemorrhage and perforation, with obstruction   . Barrett's esophagus   . Menopause   . GERD (gastroesophageal reflux disease)   . Asthma   . Chronic abdominal pain     narcotic dependence, dr gyarteng-dak at [facility]  . Narcotic abuse   . Anxiety     sees [name] Ripple NP at Dr. Radonna Ricker office  . Fx two ribs-open 08-24-11    left 4th and 5th  . Fx of fibula 07-28-11    left fibula, 2 places   . COPD (chronic obstructive pulmonary disease)   . S/P ORIF (open reduction internal fixation) fracture     both hips pinned  . Allergy   . Anemia   . History of blood transfusion   . Lap Nissen + truncal vagotomy July 2008 05/13/2013  . LEG EDEMA 01/19/2008    Qualifier: Diagnosis of  By: [name] MD, [name]   . ACUTE DUODEN ULCER W/HEMORR PERF&OBSTRUCTION 06/26/2004    Qualifier: Diagnosis of  By: [name] MD, [name]   . FEVER, RECURRENT 08/11/2009    Qualifier: Diagnosis of  By: [name] MD, [name] MENOPAUSE 01/07/2007    Qualifier: Diagnosis of  By: [name], CMA, [place]    . Pneumonia, organism unspecified 11/07/2010    Assoc with R Parapneumonic effusion 09/2010    - Tapped 10/05/10    - CxR resolved 11/07/2010    . Gastroparesis 06/03/2007    Qualifier: Diagnosis of  By: [name] MD, [name]   . GASTRIC OUTLET OBSTRUCTION 08/28/2007    In July 2008 she underwent laparoscopic enterolysis, Nissen fundoplication over a #38 bougie, single pledgeted suture colosure of the hiatus and a 3 suture wrap.  Lap truncal vagotomy and a loop gastrojejunostomy was  performed.  This was revised in August of 2009 to a roux en Y gastrojejujostomy.     . Chronic duodenal ulcer with gastric outlet obstruction 06/29/2013   AXIS IV:  other psychosocial or environmental problems, problems related to social environment and problems with primary support group AXIS V:  51-60 moderate symptoms  Plan:  No evidence of imminent risk to self or others at present.   Patient does not meet criteria for psychiatric inpatient admission. Supportive therapy provided about ongoing stressors. Discussed crisis plan, support from social network, calling 911, coming to the Emergency Department, and calling Suicide Hotline.  Subjective:   [name] is a 63 y.o. female patient admitted with malnutrition.  HPI:  Patient seen chart reviewed.  Patient 63 year old Caucasian female who recently admitted because of severe protein calorie malnutrition.  She had lost significant weight in recent months.  Consult called because patient is experiencing increased depression and anxiety symptoms.  Patient appears very tearful, emotional and upset because she felt her current condition is draining her mentally and physically.  Recently her mother has a stroke and [facility].  She feel her current medicine is not working.  She admitted poor sleep,  racing thoughts, irritability, anger and mood swings.  She endorse feeling sometimes hopeless and helpless because the situation is getting out of control and she cannot help her mother.  She admitted passive and fleeting suicidal thoughts but denies any plan or any intent.  She is not clear why she is losing weight however she had significant GI symptoms and recently underwent laparoscopic with jejunostomy and tube placement.  Patient admitted sometime feeling no desire to eat.  She feels that she is not getting enough pain medication and sleep medication.  Patient denies any paranoia,  hallucination, psychosis but endorsed irritability, anger, mood swings and poor sleep.  She endorse history of one psychiatric hospitalization many years ago at [facility] because of opiate dependence.  She was taking hydrocodone and requires detox treatment.  Patient currently denies any alcohol or any illegal substance use.  She lives with a roommate.  She has no children.  She has limited social network.  In the past she had tried Seroquel but she do not remember the details.  On her electronic medical record it is mentioned that she is prescribed Xanax and Prozac however she is currently not taking his medication.  Past Psychiatric History: Past Medical History  Diagnosis Date  . Peptic ulcer disease     dr Oletta Lamas  . Gastric outlet obstruction   . Acute duodenal ulcer with hemorrhage and perforation, with obstruction   . Barrett's esophagus   . Menopause   . GERD (gastroesophageal reflux disease)   . Asthma   . Chronic abdominal pain     narcotic dependence, dr gyarteng-dak at [facility]  . Narcotic abuse   . Anxiety     sees [name] Ripple NP at Dr. Radonna Ricker office  . Fx two ribs-open 08-24-11    left 4th and 5th  . Fx of fibula 07-28-11    left fibula, 2 places   . COPD (chronic obstructive pulmonary disease)   . S/P ORIF (open reduction internal fixation) fracture     both hips pinned  . Allergy   . Anemia   . History of blood transfusion   . Lap Nissen + truncal vagotomy July 2008 05/13/2013  . LEG EDEMA 01/19/2008    Qualifier: Diagnosis of  By: [name] MD, [name]   . ACUTE DUODEN ULCER W/HEMORR PERF&OBSTRUCTION 06/26/2004    Qualifier: Diagnosis of  By: [name] MD, [name]   . FEVER, RECURRENT 08/11/2009    Qualifier: Diagnosis of  By: [name] MD, [name] MENOPAUSE 01/07/2007    Qualifier: Diagnosis of  By: [name], CMA, [place]    . Pneumonia, organism unspecified 11/07/2010    Assoc with R Parapneumonic effusion 09/2010    - Tapped 10/05/10    - CxR resolved 11/07/2010    .  Gastroparesis 06/03/2007    Qualifier: Diagnosis of  By: [name] MD, [name]   . GASTRIC OUTLET OBSTRUCTION 08/28/2007    In July 2008 she underwent laparoscopic enterolysis, Nissen fundoplication over a #78 bougie, single pledgeted suture colosure of the hiatus and a 3 suture wrap.  Lap truncal vagotomy and a loop gastrojejunostomy was performed.  This was revised in August of 2009 to a roux en Y gastrojejujostomy.     . 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  She has a 90 pack-year smoking history. She has never used smokeless tobacco. She reports that she does not drink alcohol or  use illicit drugs. Family History  Problem Relation Age of Onset  . Cancer Mother     kidney, magliant tumor on face  . Stroke Mother   . Celiac disease Father   . Cancer Father     kidney and pancreatic     Living Arrangements: Other (Comment) (Friend)   Abuse/Neglect [facility]) Physical Abuse: Denies Verbal Abuse: Denies Sexual Abuse: Denies Allergies:   Allergies  Allergen Reactions  . Celebrex [Celecoxib] Nausea Only  . Morphine     REACTION: itching  . Quetiapine     REACTION: tardive dyskinesia  . Varenicline Tartrate     REACTION: unspecified    ACT Assessment Complete:  No:   Past Psychiatric History: Patient has history of at least one psychiatric hospitalization at [facility] years ago because of opiate addiction.  Currently she is not seeing any psychiatrist however getting Prozac from her primary care physician.  She denies a history of suicidal attempt in the past.  She is on disability.  She has no children.  She married twice.  Objective: Blood pressure 128/80, pulse 66, temperature 98.2 F (36.8 C), temperature source Oral, resp. rate 16, height '5\' 3"'  (1.6 m), weight 93 lb 7.6 oz (42.4 kg), SpO2 98.00%.Body mass index is 16.56 kg/(m^2). Results for orders placed during the hospital encounter of 12/30/13 (from the past  72 hour(s))  CBC WITH DIFFERENTIAL     Status: Abnormal   Collection Time    12/30/13  2:30 PM      Result Value Ref Range   WBC 7.1  4.0 - 10.5 K/uL   RBC 3.92  3.87 - 5.11 MIL/uL   Hemoglobin 11.8 (*) 12.0 - 15.0 g/dL   HCT 35.3 (*) 36.0 - 46.0 %   MCV 90.1  78.0 - 100.0 fL   MCH 30.1  26.0 - 34.0 pg   MCHC 33.4  30.0 - 36.0 g/dL   RDW 15.0  11.5 - 15.5 %   Platelets 271  150 - 400 K/uL   Neutrophils Relative % 62  43 - 77 %   Neutro Abs 4.5  1.7 - 7.7 K/uL   Lymphocytes Relative 27  12 - 46 %   Lymphs Abs 1.9  0.7 - 4.0 K/uL   Monocytes Relative 8  3 - 12 %   Monocytes Absolute 0.6  0.1 - 1.0 K/uL   Eosinophils Relative 2  0 - 5 %   Eosinophils Absolute 0.1  0.0 - 0.7 K/uL   Basophils Relative 1  0 - 1 %   Basophils Absolute 0.1  0.0 - 0.1 K/uL  COMPREHENSIVE METABOLIC PANEL     Status: Abnormal   Collection Time    12/30/13  2:30 PM      Result Value Ref Range   Sodium 137  137 - 147 mEq/L   Potassium 3.7  3.7 - 5.3 mEq/L   Chloride 100  96 - 112 mEq/L   CO2 23  19 - 32 mEq/L   Glucose, Bld 115 (*) 70 - 99 mg/dL   BUN 11  6 - 23 mg/dL   Creatinine, Ser 0.75  0.50 - 1.10 mg/dL   Calcium 9.3  8.4 - 10.5 mg/dL   Total Protein 6.5  6.0 - 8.3 g/dL   Albumin 3.6  3.5 - 5.2 g/dL   AST 15  0 - 37 U/L   ALT 8  0 - 35 U/L   Alkaline Phosphatase 80  39 - 117 U/L   Total  Bilirubin <0.2 (*) 0.3 - 1.2 mg/dL   GFR calc non Af Amer 89 (*) >90 mL/min   GFR calc Af Amer >90  >90 mL/min   Comment: (NOTE)     The eGFR has been calculated using the CKD EPI equation.     This calculation has not been validated in all clinical situations.     eGFR's persistently <90 mL/min signify possible Chronic Kidney     Disease.   Anion gap 14  5 - 15  HIV ANTIBODY (ROUTINE TESTING)     Status: None   Collection Time    12/30/13  2:30 PM      Result Value Ref Range   HIV 1&2 Ab, 4th Generation NONREACTIVE  NONREACTIVE   Comment: (NOTE)     A NONREACTIVE HIV Ag/Ab result does not exclude HIV  infection since     the time frame for seroconversion is variable. If acute HIV infection     is suspected, a HIV-1 RNA Qualitative TMA test is recommended.     HIV-1/2 Antibody Diff         Not indicated.     HIV-1 RNA, Qual TMA           Not indicated.     PLEASE NOTE: This information has been disclosed to you from records     whose confidentiality may be protected by state law. If your state     requires such protection, then the state law prohibits you from making     any further disclosure of the information without the specific written     consent of the person to whom it pertains, or as otherwise permitted     by law. A general authorization for the release of medical or other     information is NOT sufficient for this purpose.     The performance of this assay has not been clinically validated in     patients less than 34 years old.     Performed at [facility]     Status: None   Collection Time    12/30/13  2:30 PM      Result Value Ref Range   Magnesium 2.2  1.5 - 2.5 mg/dL  PHOSPHORUS     Status: None   Collection Time    12/30/13  2:30 PM      Result Value Ref Range   Phosphorus 3.2  2.3 - 4.6 mg/dL  TSH     Status: None   Collection Time    12/30/13  2:30 PM      Result Value Ref Range   TSH 2.430  0.350 - 4.500 uIU/mL   Comment: Performed at [facility]  CBC     Status: Abnormal   Collection Time    12/31/13  5:02 AM      Result Value Ref Range   WBC 4.6  4.0 - 10.5 K/uL   RBC 3.35 (*) 3.87 - 5.11 MIL/uL   Hemoglobin 10.1 (*) 12.0 - 15.0 g/dL   HCT 31.0 (*) 36.0 - 46.0 %   MCV 92.5  78.0 - 100.0 fL   MCH 30.1  26.0 - 34.0 pg   MCHC 32.6  30.0 - 36.0 g/dL   RDW 15.3  11.5 - 15.5 %   Platelets 222  150 - 400 K/uL  COMPREHENSIVE METABOLIC PANEL     Status: Abnormal   Collection Time    12/31/13  5:02 AM  Result Value Ref Range   Sodium 140  137 - 147 mEq/L   Potassium 4.3  3.7 - 5.3 mEq/L   Chloride 107  96 - 112 mEq/L    CO2 24  19 - 32 mEq/L   Glucose, Bld 91  70 - 99 mg/dL   BUN 7  6 - 23 mg/dL   Creatinine, Ser 0.66  0.50 - 1.10 mg/dL   Calcium 8.8  8.4 - 10.5 mg/dL   Total Protein 5.2 (*) 6.0 - 8.3 g/dL   Albumin 2.9 (*) 3.5 - 5.2 g/dL   AST 13  0 - 37 U/L   ALT 7  0 - 35 U/L   Alkaline Phosphatase 63  39 - 117 U/L   Total Bilirubin <0.2 (*) 0.3 - 1.2 mg/dL   GFR calc non Af Amer >90  >90 mL/min   GFR calc Af Amer >90  >90 mL/min   Comment: (NOTE)     The eGFR has been calculated using the CKD EPI equation.     This calculation has not been validated in all clinical situations.     eGFR's persistently <90 mL/min signify possible Chronic Kidney     Disease.   Anion gap 9  5 - 15  PREALBUMIN     Status: None   Collection Time    12/31/13 12:30 PM      Result Value Ref Range   Prealbumin 27.3  17.0 - 34.0 mg/dL   Comment: Performed at [facility] are reviewed.  Current Facility-Administered Medications  Medication Dose Route Frequency Provider Last Rate Last Dose  . dextrose 5 % and 0.45 % NaCl with KCl 20 mEq/L infusion   Intravenous Continuous Pedro Earls, MD 100 mL/hr at 12/31/13 1048    . [START ON 01/01/2014] feeding supplement (ENSURE COMPLETE) (ENSURE COMPLETE) liquid 237 mL  237 mL Oral BID BM Hazle Coca, RD      . heparin injection 5,000 Units  5,000 Units Subcutaneous 3 times per day Pedro Earls, MD   5,000 Units at 12/31/13 1525  . nicotine (NICODERM CQ - dosed in mg/24 hours) patch 14 mg  14 mg Transdermal Daily Earnstine Regal, PA-C   14 mg at 12/31/13 1822  . risperiDONE (RISPERDAL) tablet 0.25 mg  0.25 mg Oral BID Kathlee Nations, MD      . risperiDONE (RISPERDAL) tablet 0.5 mg  0.5 mg Oral QHS Kathlee Nations, MD        Psychiatric Specialty Exam:     Blood pressure 128/80, pulse 66, temperature 98.2 F (36.8 C), temperature source Oral, resp. rate 16, height '5\' 3"'  (1.6 m), weight 93 lb 7.6 oz (42.4 kg), SpO2 98.00%.Body mass index is 16.56  kg/(m^2).  General Appearance: Malnourished  Eye Contact::  Poor  Speech:  Pressured and Fast  Volume:  Increased  Mood:  Depressed, Hopeless and Irritable  Affect:  Constricted, Depressed, Labile and Tearful  Thought Process:  Loose  Orientation:  Full (Time, Place, and Person)  Thought Content:  Rumination  Suicidal Thoughts:  Yes.  without intent/plan  Homicidal Thoughts:  No  Memory:  Immediate;   Fair Recent;   Fair Remote;   Fair  Judgement:  Fair  Insight:  Fair  Psychomotor Activity:  Increased  Concentration:  Fair  Recall:  [employer] of Knowledge:Fair  Language: Fair  Akathisia:  No  Handed:  Right  AIMS (if indicated):     Assets:  Communication Skills Desire for Improvement Housing  Sleep:      Musculoskeletal: Strength & Muscle Tone: within normal limits Gait & Station: Unable to assess Patient leans: N/A  Treatment Plan Summary:  Start Risperdal 0.25 mg twice a day and 0.5 mg at bedtime.   restart Prozac if not medically contraindicated.  Consider Remeron 15 mg at bedtime to help appetite and weight gain if Prozac is contraindicated.  Monitor closely side effects including EPS and metabolic syndrome with Risperdal.  Patient does not need inpatient psychiatric services however strongly encouraged to seek counseling upon discharge coping and social skills. Please call if [phone] if there any questions or any concerns.  If patient stays in the hospital please call consultation liaison services for followup.  ARFEEN,SYED T. 12/31/2013 6:37 PM

## 2013-12-31 NOTE — Progress Notes (Signed)
Subjective: Pt wants something for pain and nerves.  She knows Dr. Hassell Done doesn't want her to have it.  Dr. Hassell Done saw her this AM.  She ate breakfast well, looks like she ate 1/2 of her lunch.  Nutrition has been ask to see.  She says she orders out allot at home, does house work, goes to [employer] or grocery during her days, spends allot of time in bed. Her mother recently went to assisted living, she is the only one left to care for her.  Objective: Vital signs in last 24 hours: Temp:  [97.7 F (36.5 C)-98.5 F (36.9 C)] 98.3 F (36.8 C) (07/10 0543) Pulse Rate:  [55-76] 55 (07/10 0543) Resp:  [16-20] 18 (07/10 0543) BP: (109-169)/(63-80) 109/63 mmHg (07/10 0543) SpO2:  [96 %-100 %] 97 % (07/10 0543) Weight:  [42.4 kg (93 lb 7.6 oz)] 42.4 kg (93 lb 7.6 oz) (07/09 1816) Last BM Date: 12/30/13 120 PO Diet: regular Afebrile, VSS Labs OK albumin and TP down, prealbumin pending No films Intake/Output from previous day: 07/09 0701 - 07/10 0700 In: 120 [P.O.:120] Out: -  Intake/Output this shift:    General appearance: alert, cooperative, appears older than stated age and no distress Resp: clear to auscultation bilaterally GI: soft, non-tender; bowel sounds normal; no masses,  no organomegaly and well healed mid line incision.  Lab Results:   Recent Labs  12/30/13 1430 12/31/13 0502  WBC 7.1 4.6  HGB 11.8* 10.1*  HCT 35.3* 31.0*  PLT 271 222    BMET  Recent Labs  12/30/13 1430 12/31/13 0502  NA 137 140  K 3.7 4.3  CL 100 107  CO2 23 24  GLUCOSE 115* 91  BUN 11 7  CREATININE 0.75 0.66  CALCIUM 9.3 8.8   PT/INR No results found for this basename: LABPROT, INR,  in the last 72 hours   Recent Labs Lab 12/30/13 1430 12/31/13 0502  AST 15 13  ALT 8 7  ALKPHOS 80 63  BILITOT <0.2* <0.2*  PROT 6.5 5.2*  ALBUMIN 3.6 2.9*     Lipase     Component Value Date/Time   LIPASE 16 09/10/2012 1803     Studies/Results: No results  found.  Medications: . feeding supplement (ENSURE COMPLETE)  237 mL Oral TID WC  . heparin  5,000 Units Subcutaneous 3 times per day  . nicotine  7 mg Transdermal Daily   Scheduled Meds: . feeding supplement (ENSURE COMPLETE)  237 mL Oral TID WC  . heparin  5,000 Units Subcutaneous 3 times per day  . nicotine  7 mg Transdermal Daily   Continuous Infusions: . dextrose 5 % and 0.45 % NaCl with KCl 20 mEq/L 100 mL/hr at 12/31/13 1048   Prior to Admission medications   Medication Sig Start Date End Date Taking? Authorizing Provider  ALPRAZolam [name]) 0.25 MG tablet Take 0.25 mg by mouth at bedtime as needed for anxiety.   Yes Historical Provider, MD  FLUoxetine (PROZAC) 40 MG capsule Take 40 mg by mouth 2 (two) times daily.   Yes Historical Provider, MD  Multiple Vitamin (MULTIVITAMIN WITH MINERALS) TABS tablet Take 1 tablet by mouth daily.   Yes Historical Provider, MD  omeprazole (PRILOSEC) 40 MG capsule Take 1 capsule (40 mg total) by mouth 2 (two) times daily. 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 Chronic peptic ulcer without obstruction Chronic gastric statsis Chronic abdominal pain/narcotic use Anxiety/depression COPD/ongoing tobacco use   Plan:  Continue to work on nutrition.  I have ask Psychiatry to see and help with long term management and care.   LOS: 1 day    Jamicheal Heard 12/31/2013

## 2013-12-31 NOTE — Progress Notes (Signed)
Grew a psychiatric evaluation to help deal with anxiety and depression.  We will do calorie counts over the weekend and see if she can provide adequate oral nutrition.  If inadequate condition and still malnourished, recommend replacement of feeding jejunostomy tube with at least nightly tube feeds.  Patient is reluctant to do that.  We will have her discuss with Dr. Hassell Done if she does not improve over the next few days.

## 2014-01-01 MED ORDER — FLUOXETINE HCL 20 MG PO CAPS
40.0000 mg | ORAL_CAPSULE | Freq: Two times a day (BID) | ORAL | Status: DC
  Administered 2014-01-01 – 2014-01-05 (×9): 40 mg via ORAL
  Filled 2014-01-01 (×11): qty 2

## 2014-01-01 NOTE — Progress Notes (Signed)
Patient ID: [name], female   DOB: [birth year], 63 y.o.   MRN: [id number]    Subjective: Had a "rough day" yesterday emotionally but is in much better spirits today. Patient has been eating well since being in the hospital. See dietary note indicating intake of 200% of estimated needs  Objective: Vital signs in last 24 hours: Temp:  [97.9 F (36.6 C)-98.2 F (36.8 C)] 97.9 F (36.6 C) (07/11 0601) Pulse Rate:  [66-73] 73 (07/11 0601) Resp:  [16-20] 20 (07/11 0601) BP: (120-128)/(68-80) 121/73 mmHg (07/11 0601) SpO2:  [98 %-99 %] 99 % (07/11 0601) Last BM Date: 12/30/13  Intake/Output from previous day:   Intake/Output this shift:    General appearance: alert, cooperative and no distress  Lab Results:   Recent Labs  12/30/13 1430 12/31/13 0502  WBC 7.1 4.6  HGB 11.8* 10.1*  HCT 35.3* 31.0*  PLT 271 222   BMET  Recent Labs  12/30/13 1430 12/31/13 0502  NA 137 140  K 3.7 4.3  CL 100 107  CO2 23 24  GLUCOSE 115* 91  BUN 11 7  CREATININE 0.75 0.66  CALCIUM 9.3 8.8     Studies/Results: No results found.  Anti-infectives: Anti-infectives   None      Assessment/Plan: I suspect and she agrees that a large part of her problem is emotional/psychiatric. Psychiatry has seen and recommendations made. Currently eating well. Continue observation and calorie count for now.     LOS: 2 days    Victorio Creeden T 01/01/2014

## 2014-01-02 MED ORDER — ZOLPIDEM TARTRATE 5 MG PO TABS
5.0000 mg | ORAL_TABLET | Freq: Every evening | ORAL | Status: DC | PRN
  Administered 2014-01-02 – 2014-01-04 (×2): 5 mg via ORAL
  Filled 2014-01-02 (×2): qty 1

## 2014-01-02 NOTE — Progress Notes (Signed)
01/02/14 1015 Patient was caught smoking in bathroom. Patient advised not to smoke. Pt has lt arm nicotine patch.

## 2014-01-02 NOTE — Progress Notes (Signed)
Calorie Count Note  48 hour calorie count ordered.  Diet: Regular Supplements: Ensure Complete BID, snacks BID  Unable to determine total calorie and protein intake throughout weekend due to incomplete records.   4/11 Breakfast: Amounts not recorded Lunch: Amounts not recorded Dinner: 350 kcal, 8 g protein Snacks: 500 kcal, 12 g protein Supplements: 350 kcal, 13 g protein (1 Ensure complete)  Total intake (from 1 meal, snacks, and supplements) 1200 kcal (86% of minimum estimated needs)  33 protein (55% of minimum estimated needs)  Nutrition Dx: Inadequate oral intake related to early satiety/mental status as evidenced by PO intake <75%, 14% body weight loss in 6 months - improving with hospitalization   Goal: Patient will meet >/=90% of estimated nutrition needs, improving with hospitalization  Assessment:  -Incomplete calorie count records, however, patient meeting 86% of estimated kcal and 55% of estimated protein needs through 1 meal, snacks, and supplements. She reports a good intake of meals, so she is likely meeting at least 100% of estimated needs.  -She does report that she did not have much of an appetite for breakfast this morning, but this she attributes to not sleeping well. Lunch was delivered during RD visit. Patient states that she is hungry.  -Patient asking if there are community resources to assist with meal preparation. Advised patient to consult with social worker (consult placed this AM).   Intervention:  -Continue Ensure Complete BID.  -Patient encouraged to consume at least 75% of meals and snacks.  -Discussed options after discharge with patient, including continuing supplements (at least 250-350 kcal and >10 g protein each), protein powder, pre-packaged meals for easy food preparation, and other strategies to increase calories and protein.   Larey Seat, RD, LDN Pager #: [phone] After-Hours Pager #: [phone]

## 2014-01-02 NOTE — Progress Notes (Signed)
Patient ID: [name], female   DOB: [birth year], 63 y.o.   MRN: [id number]    Subjective: Doesn't feel well today. Could not sleep last night and is tired. Overall has been eating okay. No anomalous GI complaints.  Objective: Vital signs in last 24 hours: Temp:  [97.5 F (36.4 C)-98.7 F (37.1 C)] 97.8 F (36.6 C) (07/12 0518) Pulse Rate:  [66-80] 71 (07/12 0518) Resp:  [20] 20 (07/12 0518) BP: (115-141)/(58-82) 115/73 mmHg (07/12 0518) SpO2:  [95 %-99 %] 97 % (07/12 0518) Weight:  [215 lb 2.7 oz (97.6 kg)] 215 lb 2.7 oz (97.6 kg) (07/11 1333) Last BM Date: 01/01/14  Intake/Output from previous day: 07/11 0701 - 07/12 0700 In: 1431.7 [P.O.:240; I.V.:1191.7] Out: -  Intake/Output this shift:    General appearance: alert, cooperative and no distress GI: soft and nontender  Lab Results:   Recent Labs  12/30/13 1430 12/31/13 0502  WBC 7.1 4.6  HGB 11.8* 10.1*  HCT 35.3* 31.0*  PLT 271 222   BMET  Recent Labs  12/30/13 1430 12/31/13 0502  NA 137 140  K 3.7 4.3  CL 100 107  CO2 23 24  GLUCOSE 115* 91  BUN 11 7  CREATININE 0.75 0.66  CALCIUM 9.3 8.8     Studies/Results: No results found.  Anti-infectives: Anti-infectives   None      Assessment/Plan: Malnutrition and weight loss Calorie counts in progress and have been adequate. Likely significant element of depression. On new medications per psychiatry consult.     LOS: 3 days    Frederik Standley T 01/02/2014

## 2014-01-03 MED ORDER — ONDANSETRON HCL 4 MG/2ML IJ SOLN
4.0000 mg | Freq: Four times a day (QID) | INTRAMUSCULAR | Status: DC | PRN
  Administered 2014-01-03 – 2014-01-04 (×5): 4 mg via INTRAVENOUS
  Filled 2014-01-03 (×6): qty 2
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
## 2014-01-03 NOTE — Progress Notes (Signed)
Patient ID: [name], female   DOB: 07/04/1950, 63 y.o.   MRN: [id number] [facility] Surgery Progress Note:   * No surgery found *  Subjective: Mental status is clear.  Feeling better and understanding the "toxic" situation at her home.  She asked about increasing her Prozac from 40 mg BID.  Would defer to psychiatry.   Objective: Vital signs in last 24 hours: Temp:  [97.9 F (36.6 C)-98.2 F (36.8 C)] 98.1 F (36.7 C) (07/13 0504) Pulse Rate:  [66-78] 70 (07/13 0504) Resp:  [18-20] 20 (07/13 0504) BP: (122-136)/(72-81) 136/81 mmHg (07/13 0504) SpO2:  [95 %-97 %] 97 % (07/13 0504) Weight:  [99 lb 1.6 oz (44.951 kg)] 99 lb 1.6 oz (44.951 kg) (07/13 0528)  Intake/Output from previous day: 07/12 0701 - 07/13 0700 In: 2321.7 [P.O.:360; I.V.:1961.7] Out: -  Intake/Output this shift:    Physical Exam: Work of breathing is normal.  No abdominal pain  Lab Results:  No results found for this or any previous visit (from the past 48 hour(s)).  Radiology/Results: No results found.  Anti-infectives: Anti-infectives   None      Assessment/Plan: Problem List: Patient Active Problem List   Diagnosis Date Noted  . Severe protein-calorie malnutrition 12/30/2013  . Constipation, chronic 07/10/2013  . Chronic abdominal pain   . COPD (chronic obstructive pulmonary disease) 11/07/2010  . DEPRESSION 06/06/2010  . LUNG NODULE 03/15/2010  . 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 CIGARETTE SMOKER 08/24/2007  . ANXIETY 01/07/2007  . BARRETT'S ESOPHAGUS, HX OF 07/03/2005    Depression probably contributing significantly to malnutrition.   * No surgery found *    LOS: 4 days   [name], MD, [facility] [phone] beeper [phone]  01/03/2014 7:35 AM

## 2014-01-03 NOTE — Progress Notes (Signed)
Calorie Count Note   Diet: Regular Supplements: Ensure Complete bid  Patient reported that the calorie count was cancelled and that she threw the calorie count records away.    Wt Readings from Last 3 Encounters:  01/03/14 99 lb 1.6 oz (44.951 kg)  12/30/13 90 lb (40.824 kg)  12/17/13 92 lb 9.6 oz (42.003 kg)   Review of chart indicates that patient has been meeting estimated needs and has gained weight since admit.  Patient verbalized not taking care of herself at home.  Reviewed with patient need to care for herself which includes eating every 3 hours or so and need for continued weight gain.  Nutrition Dx: Inadequate oral intake related to early satiety/mental status as evidenced by PO intake <75%, 14% body weight loss in 6 months - improving with hospitalization    Goal: Goal: Patient will meet >/=90% of estimated nutrition needs, improving with hospitalization   Intervention:  Continue Ensure Complete BID Encouraged continued good intake Calorie count discontinued.  Antonieta Iba, RD, LDN Clinical Inpatient Dietitian Pager:  [phone] Weekend and after hours pager:  [phone]

## 2014-01-04 MED ORDER — ALPRAZOLAM 0.5 MG PO TABS
0.5000 mg | ORAL_TABLET | Freq: Two times a day (BID) | ORAL | Status: DC | PRN
  Administered 2014-01-04 – 2014-01-05 (×3): 0.5 mg via ORAL
  Filled 2014-01-04 (×3): qty 1

## 2014-01-04 MED ORDER — BOOST / RESOURCE BREEZE PO LIQD
1.0000 | Freq: Three times a day (TID) | ORAL | Status: DC
  Administered 2014-01-04 – 2014-01-05 (×3): 1 via ORAL

## 2014-01-04 NOTE — Progress Notes (Signed)
Nutrition Note  -RN verbally consulted RD to assess supplementation needs for pt as she does not like Ensure Complete -Discussed Resource Breeze, Boost and [employer] with pt for additional supplement alternatives. -Pt willing to trial [employer] as she feel she may tolerate this type of supplement. RD d/c'd Ensure Complete -Reviewed smoothie/milkshake ideas, and additional high protein/kcal food sources to incorporate into diet -Provided pt with supplement resource list, smoothie recipes, and multiple supplement coupons to assist with pt's compliance of sustaining nutrition upon d/c   Will continue to monitor per protocols. Please re-consult as needed [place] Clinical Dietitian [phone]

## 2014-01-04 NOTE — Progress Notes (Signed)
Clinical Social Work  CSW met with patient at bedside. Patient reports she has gained some weight and is feeling better today. Patient states she was feeling emotional this morning and cried for a long time in the shower. Patient spoke about her mother being placed in memory care and feeling overwhelmed with making decisions. Patient reports her mother never explained to her about her assets or savings. Patient reports she feels alone and that she has no other family to help support her. CSW allowed patient time to express her feelings and spoke about positive coping skills. Patient continues to report she is not interested in individual therapy. CSW suggested that patient join a support group in order to develop informal supports. Patient reports she will consider options and CSW agreeable to research available options.  CSW will continue to follow.  [place], [place] [phone]

## 2014-01-04 NOTE — Progress Notes (Signed)
Patient ID: [name], female   DOB: [birth year], 63 y.o.   MRN: [id number] [facility] Surgery Progress Note:   * No surgery found *  Subjective: Mental status is alert and tearful.  Feeling overwhelmed.   Objective: Vital signs in last 24 hours: Temp:  [97.9 F (36.6 C)-98.5 F (36.9 C)] 98.3 F (36.8 C) (07/14 0547) Pulse Rate:  [61-74] 71 (07/14 0547) Resp:  [18-20] 20 (07/14 0547) BP: (109-132)/(63-87) 109/63 mmHg (07/14 0547) SpO2:  [95 %-99 %] 95 % (07/14 0547)  Intake/Output from previous day: 07/13 0701 - 07/14 0700 In: 1456 [I.V.:1456] Out: -  Intake/Output this shift:    Physical Exam: Work of breathing is normal.  No abdominal pain  Lab Results:  No results found for this or any previous visit (from the past 48 hour(s)).  Radiology/Results: No results found.  Anti-infectives: Anti-infectives   None      Assessment/Plan: Problem List: Patient Active Problem List   Diagnosis Date Noted  . Severe protein-calorie malnutrition 12/30/2013  . Constipation, chronic 07/10/2013  . Chronic abdominal pain   . COPD (chronic obstructive pulmonary disease) 11/07/2010  . DEPRESSION 06/06/2010  . LUNG NODULE 03/15/2010  . 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 CIGARETTE SMOKER 08/24/2007  . ANXIETY 01/07/2007  . BARRETT'S ESOPHAGUS, HX OF 07/03/2005    Requesting Xanax for anxiety.  Eating better.  May be able to go home tomorrow * No surgery found *    LOS: 5 days   [name], MD, [facility] [phone] beeper [phone]  01/04/2014 8:46 AM

## 2014-01-05 MED ORDER — RISPERIDONE 0.25 MG PO TABS
0.2500 mg | ORAL_TABLET | Freq: Two times a day (BID) | ORAL | Status: DC
Start: 2014-01-05 — End: 2014-09-06

## 2014-01-05 MED ORDER — ALPRAZOLAM 0.5 MG PO TABS: 0.5000 mg | ORAL_TABLET | Freq: Three times a day (TID) | ORAL | Status: DC

## 2014-01-05 MED ORDER — NICOTINE 14 MG/24HR TD PT24: 14.0000 mg | MEDICATED_PATCH | Freq: Every day | TRANSDERMAL | Status: DC

## 2014-01-05 MED ORDER — RISPERIDONE 0.5 MG PO TABS: 0.5000 mg | ORAL_TABLET | Freq: Every day | ORAL | Status: DC

## 2014-01-05 NOTE — Progress Notes (Signed)
Clinical Social Work  CSW met with patient at bedside. Patient on the phone with roommate and reports that they have been arguing. Patient states they have lived together for 5 years and it becomes difficult to have a roommate for that long. Patient reports she will figure out plans but plans to return home at [place].  CSW provided patient with support group information. Patient continues to talk about the stress of caring for her mother so CSW provided Caregiver Support Group information. Patient reports she does not have any informal supports and is excited to meet other people in groups. Patient states she runs errands but feels it is important to have structured time to focus on emotional wellbeing. CSW discussed individual therapy with patient again but patient is not interested in services. Patient grateful and appreciative of support group information.  CSW will continue to follow.  [place], [place] [phone]

## 2014-01-05 NOTE — Progress Notes (Signed)
Pt leaving at this time.  Discharge instructions/prescription given/explained with pt verbalizing understanding. Pt without c/o. Pt drove to hospital and will be driving herself home.  Pt has cell phone, purse, and teeth.

## 2014-01-05 NOTE — Discharge Summary (Signed)
Physician Discharge Summary  Patient ID: KANYIA HEASLIP MRN: [id number] DOB/AGE: [birth year] 63 y.o.  Admit date: 12/30/2013 Discharge date: 01/05/2014  Admission Diagnoses:  Malnutrition   Discharge Diagnoses:  Malnutrition and depression  Principal Problem:   Severe protein-calorie malnutrition Active Problems:   CIGARETTE SMOKER   DEPRESSION   COPD (chronic obstructive pulmonary disease)   Chronic abdominal pain   Surgery:  none  Discharged Condition: improved  Hospital Course:   Admitted for inanition and weight loss down to 90 lbs.  Begun on calorie counts and psych consult adding meds which she will be discharged on .   Consults: psych  Significant Diagnostic Studies: lab    Discharge Exam: Blood pressure 119/68, pulse 69, temperature 98.4 F (36.9 C), temperature source Oral, resp. rate 18, height 5\' 3"  (1.6 m), weight 98 lb 2 oz (44.509 kg), SpO2 98.00%. No change in physical exam and no pain noted  Disposition: 06-Home-Health Care Svc  Discharge Instructions   Call MD for:    Complete by:  As directed   Weight loss     Diet general    Complete by:  As directed   Eat ad lib.  Protein fats and carbs as tolerated.     Discharge instructions    Complete by:  As directed   Maximize caloric intake     Increase activity slowly    Complete by:  As directed      No wound care    Complete by:  As directed             Medication List         ALPRAZolam 0.5 MG tablet  Commonly known as:  XANAX  Take 1 tablet (0.5 mg total) by mouth 3 (three) times daily.     FLUoxetine 40 MG capsule  Commonly known as:  PROZAC  Take 40 mg by mouth 2 (two) times daily.     multivitamin with minerals Tabs tablet  Take 1 tablet by mouth daily.     nicotine 14 mg/24hr patch  Commonly known as:  NICODERM CQ - dosed in mg/24 hours  Place 1 patch (14 mg total) onto the skin daily.     omeprazole 40 MG capsule  Commonly known as:  PRILOSEC  Take 1 capsule (40 mg total) by  mouth 2 (two) times daily.     risperiDONE 0.25 MG tablet  Commonly known as:  RISPERDAL  Take 1 tablet (0.25 mg total) by mouth 2 (two) times daily.     risperiDONE 0.5 MG tablet  Commonly known as:  RISPERDAL  Take 1 tablet (0.5 mg total) by mouth at bedtime.           Follow-up Information   Follow up with Pedro Earls, MD.   Specialty:  General Surgery   Contact information:   [address] [place]  [address] [phone]       Signed: Pedro Earls 01/05/2014, 12:17 PM

## 2014-01-05 NOTE — Care Management Note (Unsigned)
    Page 1 of 1   01/05/2014     1:56:14 PM CARE MANAGEMENT NOTE 01/05/2014  Patient:  [name], [name]   Account Number:  [id number]  Date Initiated:  12/31/2013  Documentation initiated by:  [facility]  Subjective/Objective Assessment:   63 year old female admitted with malnutrition.     Action/Plan:   From home.   Anticipated DC Date:  01/05/2014   Anticipated DC Plan:  [place]  CM consult      Choice offered to / List presented to:             Status of service:  In process, will continue to follow Medicare Important Message given?  YES (If response is "NO", the following Medicare IM given date fields will be blank) Date Medicare IM given:  01/05/2014 Medicare IM given by:  [facility] Date Additional Medicare IM given:   Additional Medicare IM given by:    Discharge Disposition:  HOME/SELF CARE  Per UR Regulation:  Reviewed for med. necessity/level of care/duration of stay  If discussed at [place] of Stay Meetings, dates discussed:    Comments:

## 2014-01-07 NOTE — H&P (Signed)
Chief Complaint:  Severe protein calorie malnutrition  History of Present Illness:  [name] is an 63 y.o. 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   chronic peptic ulcer disease without with obstruction and gastric stasis.  She has struggled as she insisted on her feeding tube removal. Recently her mother has had a stroke. She is an only child and I think she will lie upon her mother a lot to help her with eating. She has continued to not eat and today comes in with a weight of 90 pounds height 5 foot 3 inch frame. She has severe protein calorie malnutrition and feels like she is willing to die.  I have no other alternative but to admit her for forced feedings and calorie counts.       Past Medical History   Diagnosis  Date   .  Peptic ulcer disease         dr Oletta Lamas   .  Gastric outlet obstruction     .  Acute duodenal ulcer with hemorrhage and perforation, with obstruction     .  Barrett's esophagus     .  Menopause     .  GERD (gastroesophageal reflux disease)     .  Asthma     .  Chronic abdominal pain         narcotic dependence, dr gyarteng-dak at [facility]   .  Narcotic abuse     .  Anxiety         sees [name] NP at Dr. Radonna Ricker office   .  Fx two ribs-open  08-24-11       left 4th and 5th   .  Fx of fibula  07-28-11       left fibula, 2 places    .  COPD (chronic obstructive pulmonary disease)     .  S/P ORIF (open reduction internal fixation) fracture         both hips pinned   .  Allergy     .  Anemia     .  History of blood transfusion     .  Lap Nissen + truncal vagotomy July 2008  05/13/2013   .  LEG EDEMA  01/19/2008       Qualifier: Diagnosis of  By: [name] MD, [name]    .  ACUTE DUODEN ULCER W/HEMORR PERF&OBSTRUCTION  06/26/2004       Qualifier: Diagnosis of  By: [name] MD, [name]    .  FEVER, RECURRENT   08/11/2009       Qualifier: Diagnosis of  By: [name] MD, [name]  MENOPAUSE  01/07/2007       Qualifier: Diagnosis of  By: [name], CMA, [place]     .  Pneumonia, organism unspecified  11/07/2010       Assoc with R Parapneumonic effusion 09/2010    - Tapped 10/05/10    - CxR resolved 11/07/2010         Past Surgical History   Procedure  Laterality  Date   .  Esophagogastroduodenoscopy    12-29-09       dr qadeer at [facility], several gastric ulcers   .  Vagotomy           lapaproscopic - nissan   .  Roux-en-y gastric bypass    02-20-08  dr Hassell Done   .  Hernia repair       .  Perforated ulcer       .  Biopsy thyroid    08-13-11       benign nodule, per Dr. Melida Quitter    .  Esophagogastroduodenoscopy  N/A  09/25/2012       Procedure: ESOPHAGOGASTRODUODENOSCOPY (EGD);  Surgeon: Beryle Beams, MD;  Location: [facility];  Service: Endoscopy;  Laterality: N/A;   .  Fracture surgery  Bilateral         hips with screw   .  Tonsillectomy       .  Eye surgery  Bilateral         cataract extraction with IOL   .  Laparoscopic partial gastrectomy  N/A  06/29/2013       Procedure: Gastrectomy and GJ Tube placement;  Surgeon: Pedro Earls, MD;  Location: [facility];  Service: General;  Laterality: N/A;       Current Outpatient Prescriptions   Medication  Sig  Dispense  Refill   .  ALPRAZolam (XANAX) 0.25 MG tablet  Take 1 tablet (0.25 mg total) by mouth at bedtime as needed for anxiety.   30 tablet   0   .  FLUoxetine (PROZAC) 40 MG capsule  Take 40 mg by mouth 2 (two) times daily.         Marland Kitchen  FLUoxetine (PROZAC) 40 MG capsule  TAKE 2 CAPSULES BY MOUTH EVERY DAY   180 capsule   3   .  Multiple Vitamin (MULTIVITAMIN WITH MINERALS) TABS tablet  Take 1 tablet by mouth daily.         Marland Kitchen  omeprazole (PRILOSEC) 40 MG capsule  Take 1 capsule (40 mg total) by mouth 2 (two) times daily.   180 capsule   3   .  promethazine (PHENERGAN) 25 MG suppository  INSERT 1 SUPPOSITORY RECTALLY EVERY 4 TO 6 HOURS   60  suppository   3      No current facility-administered medications for this visit.    Celebrex; Morphine; Quetiapine; and Varenicline tartrate Family History   Problem  Relation  Age of Onset   .  Cancer  Mother         kidney, magliant tumor on face   .  Stroke  Mother     .  Celiac disease  Father     .  Cancer  Father         kidney and pancreatic    Social History:  reports that she has been smoking Cigarettes.  She has a 90 pack-year smoking history. She has never used smokeless tobacco. She reports that she does not drink alcohol or use illicit drugs.   REVIEW OF SYSTEMS : It's complicated..still smoking?  Not eating.  Continued slide in weight  Physical Exam:    Blood pressure 110/70, pulse 106, temperature 98.7 F (37.1 C), height 5\' 3"  (1.6 m), weight 90 lb (40.824 kg). Body mass index is 15.95 kg/(m^2).  Gen:  Emaciated white female  Neurological: Alert and oriented to person, place, and time. Motor and sensory function is grossly intact  Head: Normocephalic and atraumatic.   Eyes: Conjunctivae are normal. Pupils are equal, round, and reactive to light. No scleral icterus.  Neck: Normal range of motion. Neck supple. No tracheal deviation or thyromegaly present.  Cardiovascular:  SR without murmurs or gallops.  No carotid bruits Breast:  Not examined Respiratory: Effort normal.  No respiratory distress. No chest wall tenderness. Breath sounds normal.  No wheezes, rales or rhonchi.   Abdomen:  Thin with dark scars from chronic heating pad GU:  Not examined Musculoskeletal: Normal range of motion. Extremities are nontender. No cyanosis, edema or clubbing noted Lymphadenopathy: No cervical, preauricular, postauricular or axillary adenopathy is present Skin: Skin is warm and dry. No rash noted. No diaphoresis. No erythema. No pallor. Pscyh: Normal mood and affect. Behavior is normal. Judgment and thought content normal.   LABORATORY RESULTS: No results found for this or  any previous visit (from the past 48 hour(s)).   RADIOLOGY RESULTS: No results found.  Problem List: Patient Active Problem List     Diagnosis  Date Noted   .  Severe protein-calorie malnutrition  12/30/2013   .  Constipation, chronic  07/10/2013   .  Chronic abdominal pain     .  Chronic duodenal ulcer with gastric outlet obstruction  06/29/2013   .  Moderate malnutrition  05/13/2013   .  COPD (chronic obstructive pulmonary disease)  11/07/2010   .  DEPRESSION  06/06/2010   .  LUNG NODULE  03/15/2010   .  TARDIVE DYSKINESIA  11/17/2009   .  WEIGHT LOSS  08/11/2009   .  GASTRIC OUTLET OBSTRUCTION  08/28/2007   .  CIGARETTE SMOKER  08/24/2007   .  Gastroparesis  06/03/2007   .  SYMPTOM, PAIN, ABDOMINAL, GENERALIZED  03/16/2007   .  ANXIETY  01/07/2007   .  BARRETT'S ESOPHAGUS, HX OF  07/03/2005     Assessment & Plan: Severe protein calorie malnutrition.  Plan admission for supervised calorie consumption and possible replacement of feeding tube.     [name], MD, FACS This H&P done by Dr. Hassell Done in the office, I have just copied and pasted to your form. [facility] [phone] beeper [phone]  12/30/2013 12:54 PM

## 2014-01-20 ENCOUNTER — Ambulatory Visit (INDEPENDENT_AMBULATORY_CARE_PROVIDER_SITE_OTHER): Payer: Medicare Other | Admitting: Surgery

## 2014-01-20 ENCOUNTER — Telehealth (INDEPENDENT_AMBULATORY_CARE_PROVIDER_SITE_OTHER): Payer: Self-pay

## 2014-01-20 VITALS — BP 110/70 | HR 72 | Temp 97.5°F | Ht 62.0 in | Wt 91.0 lb

## 2014-01-20 DIAGNOSIS — E43 Unspecified severe protein-calorie malnutrition: Secondary | ICD-10-CM

## 2014-01-20 MED ORDER — ALPRAZOLAM 0.5 MG PO TABS: 0.5000 mg | ORAL_TABLET | Freq: Three times a day (TID) | ORAL | Status: DC

## 2014-01-20 MED ORDER — HYDROCODONE-ACETAMINOPHEN 5-325 MG PO TABS: 1.0000 | ORAL_TABLET | ORAL | Status: DC | PRN

## 2014-01-20 MED ORDER — ONDANSETRON 4 MG PO TBDP: 4.0000 mg | ORAL_TABLET | Freq: Three times a day (TID) | ORAL | Status: DC | PRN

## 2014-01-20 NOTE — Patient Instructions (Signed)
Upper G I swallow from recently: A double contrast study was performed. The mucosa of the esophagus  is unremarkable. A single contrast study shows the swallowing  mechanism to be normal. The gastric pouch fills with no obstruction.  There is free passage of barium into the jejunum with no  obstruction. No marginal ulceration is seen. The proximal small  bowel is normal in caliber. No reflux is demonstrated.  IMPRESSION:  1. No obstruction to the passage of barium into the gastric pouch or  into the small bowel is seen. The anastomosis appears widely patent.  2. No ulcerations are noted.

## 2014-01-20 NOTE — Telephone Encounter (Signed)
Letter written by Dr. Hassell Done for patient to give to her Financial Advisor Anette Riedel) mailed to patients home address noted in EPIC.

## 2014-01-20 NOTE — Progress Notes (Signed)
[name] 63 y.o.  Body mass index is 16.64 kg/(m^2).  Patient Active Problem List   Diagnosis Date Noted  . Severe protein-calorie malnutrition 12/30/2013  . Constipation, chronic 07/10/2013  . Chronic abdominal pain   . COPD (chronic obstructive pulmonary disease) 11/07/2010  . DEPRESSION 06/06/2010  . LUNG NODULE 03/15/2010  . 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 CIGARETTE SMOKER 08/24/2007  . ANXIETY 01/07/2007  . BARRETT'S ESOPHAGUS, HX OF 07/03/2005    Allergies  Allergen Reactions  . Celebrex [Celecoxib] Nausea Only  . Morphine     REACTION: itching  . Quetiapine     REACTION: tardive dyskinesia  . Varenicline Tartrate     REACTION: unspecified      Past Surgical History  Procedure Laterality Date  . Esophagogastroduodenoscopy  12-29-09    dr qadeer at [facility], several gastric ulcers  . Vagotomy      lapaproscopic - nissan  . 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 Hernia repair    . Perforated ulcer    . Biopsy thyroid  08-13-11    benign nodule, per Dr. Melida Quitter   . Esophagogastroduodenoscopy N/A 09/25/2012    Procedure: ESOPHAGOGASTRODUODENOSCOPY (EGD);  Surgeon: Beryle Beams, MD;  Location: [facility];  Service: Endoscopy;  Laterality: N/A;  . Fracture surgery Bilateral     hips with screw  . Tonsillectomy    . Eye surgery Bilateral     cataract extraction with IOL  . Laparoscopic partial gastrectomy N/A 06/29/2013    Procedure: Gastrectomy and GJ Tube placement;  Surgeon: Pedro Earls, MD;  Location: [facility];  Service: General;  Laterality: N/A;   [name], MD No diagnosis found.  [name] returns today in her weight is 91 pounds. Following her hospitalization and psychiatric intervention she was discharged. Her mother's condition has worsened and she is stroke-related dementia he will never return home.She has a Geographical information systems officer and needs a letter to release funds to provide her with some in home care.  I will write such a letter but need  the individulal's name and address.   [name] is smoking 3 packs of cigs a day.  I have encouraged her to eat.  She sometimes complains of abdominal pain with swallowing and requested pain med.  Also she wanted Zofran for nausea and a refill on her Xanax for her "nerves".  Will see back in 6 weeks  [name], MD, [facility] [phone] beeper [phone]  01/20/2014 11:05 AM

## 2014-01-20 NOTE — Telephone Encounter (Signed)
Pt called stating Dr Hassell Done wanted her trust fund managers name. Pt states the letter for [place] assistance needs to be written to Anette Riedel, Music therapist. Pt request letter be mailed to her home address [address] [place]. Pt can be reached at [phone] with any questions.

## 2014-01-21 ENCOUNTER — Encounter (INDEPENDENT_AMBULATORY_CARE_PROVIDER_SITE_OTHER): Payer: Self-pay

## 2014-01-27 ENCOUNTER — Telehealth (INDEPENDENT_AMBULATORY_CARE_PROVIDER_SITE_OTHER): Payer: Self-pay

## 2014-01-27 ENCOUNTER — Telehealth (INDEPENDENT_AMBULATORY_CARE_PROVIDER_SITE_OTHER): Payer: Self-pay | Admitting: Surgery

## 2014-01-27 DIAGNOSIS — R109 Unspecified abdominal pain: Principal | ICD-10-CM

## 2014-01-27 DIAGNOSIS — G8929 Other chronic pain: Secondary | ICD-10-CM

## 2014-01-27 NOTE — Telephone Encounter (Signed)
Pt called in hysterically crying because she moved her mothers belongings to assisted living and they are both upset. She is asking for refill of xanax to help her calm down and hydrocodone for abdominal pain. Advised we will send request to Dr Hassell Done and call her back.

## 2014-01-27 NOTE — Telephone Encounter (Signed)
Telephone call from patient requesting pain medication for abdominal pain.  Discussed with patient.  Told her to contact Dr. Hassell Done or his nurse during office hours tomorrow, otherwise she would have to come to ER for evaluation by EDP.  No medications / prescriptions given.  Earnstine Regal, MD, [facility] Office: [phone]

## 2014-01-28 ENCOUNTER — Other Ambulatory Visit (INDEPENDENT_AMBULATORY_CARE_PROVIDER_SITE_OTHER): Payer: Self-pay

## 2014-01-28 MED ORDER — ALPRAZOLAM 0.5 MG PO TABS: 0.5000 mg | ORAL_TABLET | Freq: Three times a day (TID) | ORAL | Status: DC

## 2014-01-28 MED ORDER — HYDROCODONE-ACETAMINOPHEN 5-325 MG PO TABS: 1.0000 | ORAL_TABLET | ORAL | Status: DC | PRN

## 2014-01-28 NOTE — Telephone Encounter (Signed)
Will forward this to [place].

## 2014-01-28 NOTE — Telephone Encounter (Signed)
Spoke with Dr Hassell Done, he has approved a refill on her Xanax and Hydrocodone. Will have one of his partners to sign this. Called pt to inform her that rx for Xanax and Hydrocodone will be ready for her tp pick up at the front desk. Pt verbalized understanding.

## 2014-02-01 ENCOUNTER — Telehealth (INDEPENDENT_AMBULATORY_CARE_PROVIDER_SITE_OTHER): Payer: Self-pay | Admitting: *Deleted

## 2014-02-01 NOTE — Telephone Encounter (Signed)
Pt called in this morning stating that her mom has had a stroke and this has her going around and around and she states that she is in bad shape again.  She states that her wt is down again.  She states that she isn't eating much, isn't going to the bathroom much, and is hurting real bad.  She states, "they don't give you refills of anything anymore."  I advised pt that she was given #30 of Hydrocodone on 01-28-14 and also received a rx for Xanax.  Pt states that she forgot, and she said, "well he want refill that now".  I advised pt that it is likely that Dr. Hassell Done would not refill at this time.  Just wanted to send a note for FYI to Dr. Hassell Done.  Anderson Malta

## 2014-02-06 ENCOUNTER — Ambulatory Visit (INDEPENDENT_AMBULATORY_CARE_PROVIDER_SITE_OTHER): Payer: Medicare Other | Admitting: Family Medicine

## 2014-02-06 VITALS — BP 118/76 | HR 73 | Temp 97.8°F | Resp 14 | Ht 62.75 in | Wt 90.6 lb

## 2014-02-06 DIAGNOSIS — G8929 Other chronic pain: Secondary | ICD-10-CM

## 2014-02-06 DIAGNOSIS — K262 Acute duodenal ulcer with both hemorrhage and perforation: Secondary | ICD-10-CM

## 2014-02-06 DIAGNOSIS — R109 Unspecified abdominal pain: Secondary | ICD-10-CM

## 2014-02-06 DIAGNOSIS — K267 Chronic duodenal ulcer without hemorrhage or perforation: Secondary | ICD-10-CM

## 2014-02-06 DIAGNOSIS — R110 Nausea: Secondary | ICD-10-CM

## 2014-02-06 DIAGNOSIS — Z9889 Other specified postprocedural states: Secondary | ICD-10-CM

## 2014-02-06 DIAGNOSIS — K311 Adult hypertrophic pyloric stenosis: Secondary | ICD-10-CM

## 2014-02-06 DIAGNOSIS — F411 Generalized anxiety disorder: Secondary | ICD-10-CM

## 2014-02-06 DIAGNOSIS — E43 Unspecified severe protein-calorie malnutrition: Secondary | ICD-10-CM

## 2014-02-06 DIAGNOSIS — Z09 Encounter for follow-up examination after completed treatment for conditions other than malignant neoplasm: Secondary | ICD-10-CM

## 2014-02-06 DIAGNOSIS — R634 Abnormal weight loss: Secondary | ICD-10-CM

## 2014-02-06 LAB — COMPREHENSIVE METABOLIC PANEL
ALK PHOS: 71 U/L (ref 39–117)
ALT: 8 U/L (ref 0–35)
AST: 14 U/L (ref 0–37)
Albumin: 3.9 g/dL (ref 3.5–5.2)
BILIRUBIN TOTAL: 0.2 mg/dL (ref 0.2–1.2)
BUN: 12 mg/dL (ref 6–23)
CO2: 20 mEq/L (ref 19–32)
CREATININE: 0.72 mg/dL (ref 0.50–1.10)
Calcium: 8.4 mg/dL (ref 8.4–10.5)
Chloride: 107 mEq/L (ref 96–112)
Glucose, Bld: 78 mg/dL (ref 70–99)
Potassium: 4.3 mEq/L (ref 3.5–5.3)
Sodium: 140 mEq/L (ref 135–145)
Total Protein: 6.2 g/dL (ref 6.0–8.3)

## 2014-02-06 LAB — POCT CBC
Granulocyte percent: 71.6 %G (ref 37–80)
HCT, POC: 39.5 % (ref 37.7–47.9)
HEMOGLOBIN: 12.5 g/dL (ref 12.2–16.2)
LYMPH, POC: 2 (ref 0.6–3.4)
MCH, POC: 28.6 pg (ref 27–31.2)
MCHC: 31.6 g/dL — AB (ref 31.8–35.4)
MCV: 90.5 fL (ref 80–97)
MID (CBC): 0.3 (ref 0–0.9)
MPV: 8.7 fL (ref 0–99.8)
POC Granulocyte: 5.8 (ref 2–6.9)
POC LYMPH PERCENT: 25.3 %L (ref 10–50)
POC MID %: 3.1 %M (ref 0–12)
Platelet Count, POC: 349 10*3/uL (ref 142–424)
RBC: 4.37 M/uL (ref 4.04–5.48)
RDW, POC: 16.2 %
WBC: 8.1 10*3/uL (ref 4.6–10.2)

## 2014-02-06 LAB — POCT URINALYSIS DIPSTICK
BILIRUBIN UA: NEGATIVE
Blood, UA: NEGATIVE
Glucose, UA: NEGATIVE
Ketones, UA: NEGATIVE
LEUKOCYTES UA: NEGATIVE
Nitrite, UA: NEGATIVE
PH UA: 5
PROTEIN UA: NEGATIVE
Spec Grav, UA: 1.01
Urobilinogen, UA: 0.2

## 2014-02-06 LAB — FOLATE: Folate: 9.8 ng/mL

## 2014-02-06 LAB — FERRITIN: FERRITIN: 35 ng/mL (ref 10–291)

## 2014-02-06 LAB — PREALBUMIN: Prealbumin: 28.5 mg/dL (ref 17.0–34.0)

## 2014-02-06 LAB — IRON: Iron: 50 ug/dL (ref 42–145)

## 2014-02-06 LAB — VITAMIN B12: VITAMIN B 12: 527 pg/mL (ref 211–911)

## 2014-02-06 MED ORDER — ONDANSETRON 4 MG PO TBDP
8.0000 mg | ORAL_TABLET | Freq: Once | ORAL | Status: AC
Start: 2014-02-06 — End: 2014-02-06
  Administered 2014-02-06: 8 mg via ORAL

## 2014-02-06 MED ORDER — MIRTAZAPINE 7.5 MG PO TABS: 7.5000 mg | ORAL_TABLET | Freq: Every day | ORAL | Status: DC

## 2014-02-06 MED ORDER — ONDANSETRON 4 MG PO TBDP: 4.0000 mg | ORAL_TABLET | Freq: Three times a day (TID) | ORAL | Status: DC | PRN

## 2014-02-06 MED ORDER — HYDROCODONE-ACETAMINOPHEN 5-325 MG PO TABS: 1.0000 | ORAL_TABLET | ORAL | Status: DC | PRN

## 2014-02-06 NOTE — Patient Instructions (Addendum)
1.  Recommend you see Dr. Linna Darner each time you return to [facility]. You get better care if you are followed by the same primary care physician.  2.  Recommend follow-up in one month with Dr. Linna Darner. 3.  Highly recommend evaluation by therapist for counseling to help deal with current stressors. 4. Highly recommend evaluation by nutritionist to help with ongoing struggles with weight loss.   5. Highly recommend evaluation by psychiatrist to help with management of anxiety, weight loss, chronic abdominal pain.  6. You should only receive pain medication from [facility]; [facility] will not provide further prescriptions for pain medication.

## 2014-02-06 NOTE — Progress Notes (Addendum)
Subjective:   This chart was scribed for Wardell Honour, MD by Forrestine Him, [facility] Sc[facility]. This patient was seen in room 1 and the patient's care was started 9:18 AM.    Patient ID: [name], [name]    DOB: 1950/08/27, 63 y.o.   MRN: [id number]  02/06/2014  GI Problem, Fatigue, Weight Loss and Pt. was offered a gown but refused due to being cold   HPI  HPI Comments: [name] is a 63 y.o. [name] with a PMHx of peptic ulcer disease, GERD, gastroparesis, and chronic abdominal pain, who presents to [facility] complaining of constant, moderate abdominal pain that is chronic in nature. At this time she is unable to eat secondary to symptoms. Yesterday for breakfast she had a small piece of sausage, a small amount of soup for lunch, and a small bite of sandwich. She denies any vomiting due to fundoplication surgery in the past. She also reports weight  loss and fatigue. Today she is requesting pain medication to help manage symptoms until she is able to get in with her general surgeon, Dr. Hassell Done, who is currently and chronically managing her pain. At this time she is unable to schedule an appointment with Dr. Hassell Done as he is always booked. Currently she is not followed by pain management or a gastroenterologist. Pt was last prescribed pain medication and Xanax 8/7 by Dr. Hassell Done. On 8/11 pt called Dr. Hassell Done for a pain medication refill; however, request was denied as she was given a prescription on 8/7. Mother had a stroke a couple of months ago and pt attributes recent worsening symptoms to stress.  All prescribed medications dispensed from [facility] surgery with no evidence of drug abuse or drug seeking behavior per [place] database.  TSH was normal in July. During a visit on 08/18/13 with Dr. Hassell Done pt instisted on the removal of her feeding tube; had feeding tube in place for six weeks; weight with feeding tube removal of 102 pounds. Pt was  admitted to the hospital on 7/9 for protein calorie malnutrition and discharged on 01/05/2014 per general surgery. Psych consult during visit in hospital and consideration for possible reinsertion of feeding tube. States she spoke to someone regarding outpatient psych evaluation but states " I dont need to talk to someone". At discharge pt weighed 98 pounds. She was released on Prozac 40 mg 2 times daily along with Risperdal.   States last complete physical examination and mammogram were several years ago. Pt states she feels she needs to get her current medical issues under control before worrying about other possible health issues and preventative measures.  At this time she denies any lower extremity swelling, diarrhea, constipation, or chest pain.  She is followed by PCP Dr. Delma Freeze but has not seen him in several years.  She often sees Dr. Linna Darner at [facility] and considers him her PCP as well.   Review of Systems  Constitutional: Positive for activity change, appetite change and unexpected weight change. Negative for fever and chills.  Respiratory: Positive for shortness of breath (with exertion). Negative for cough.   Cardiovascular: Negative for chest pain and leg swelling.  Gastrointestinal: Positive for nausea and abdominal pain. Negative for vomiting, diarrhea, constipation and abdominal distention.  Neurological: Positive for light-headedness. Negative for dizziness.  Psychiatric/Behavioral: Negative for confusion. The patient is nervous/anxious.     Past Medical History  Diagnosis Date  . Peptic ulcer disease  dr Oletta Lamas  . Gastric outlet obstruction   . Acute duodenal ulcer with hemorrhage and perforation, with obstruction   . Barrett's esophagus   . Menopause   . GERD (gastroesophageal reflux disease)   . Asthma   . Chronic abdominal pain     narcotic dependence, dr gyarteng-dak at [facility]  . Narcotic abuse   . Anxiety     sees [name] NP at Dr. Radonna Ricker  office  . Fx two ribs-open 08-24-11    left 4th and 5th  . Fx of fibula 07-28-11    left fibula, 2 places   . COPD (chronic obstructive pulmonary disease)   . S/P ORIF (open reduction internal fixation) fracture     both hips pinned  . Allergy   . Anemia   . History of blood transfusion   . Lap Nissen + truncal vagotomy July 2008 05/13/2013  . LEG EDEMA 01/19/2008    Qualifier: Diagnosis of  By: [name] MD, [name]   . ACUTE DUODEN ULCER W/HEMORR PERF&OBSTRUCTION 06/26/2004    Qualifier: Diagnosis of  By: [name] MD, [name]   . FEVER, RECURRENT 08/11/2009    Qualifier: Diagnosis of  By: [name] MD, [name] MENOPAUSE 01/07/2007    Qualifier: Diagnosis of  By: [name], CMA, [place]    . Pneumonia, organism unspecified 11/07/2010    Assoc with R Parapneumonic effusion 09/2010    - Tapped 10/05/10    - CxR resolved 11/07/2010    . Gastroparesis 06/03/2007    Qualifier: Diagnosis of  By: [name] MD, [name]   . GASTRIC OUTLET OBSTRUCTION 08/28/2007    In July 2008 she underwent laparoscopic enterolysis, Nissen fundoplication over a #20 bougie, single pledgeted suture colosure of the hiatus and a 3 suture wrap.  Lap truncal vagotomy and a loop gastrojejunostomy was performed.  This was revised in August of 2009 to a roux en Y gastrojejujostomy.     . Chronic duodenal ulcer with gastric outlet obstruction 06/29/2013   Past Surgical History  Procedure Laterality Date  . Esophagogastroduodenoscopy  12-29-09    dr qadeer at [facility], several gastric ulcers  . Vagotomy      lapaproscopic - nissan  . 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 Hernia repair    . Perforated ulcer    . Biopsy thyroid  08-13-11    benign nodule, per Dr. Melida Quitter   . Esophagogastroduodenoscopy N/A 09/25/2012    Procedure: ESOPHAGOGASTRODUODENOSCOPY (EGD);  Surgeon: Beryle Beams, MD;  Location: [facility];  Service: Endoscopy;  Laterality: N/A;  . Fracture surgery Bilateral     hips with screw  . Tonsillectomy    . Eye  surgery Bilateral     cataract extraction with IOL  . Laparoscopic partial gastrectomy N/A 06/29/2013    Procedure: Gastrectomy and GJ Tube placement;  Surgeon: Pedro Earls, MD;  [facility]: [facility];  Service: General;  Laterality: N/A;   Allergies  Allergen Reactions  . Celebrex [Celecoxib] Nausea Only  . Morphine     REACTION: itching  . Quetiapine     REACTION: tardive dyskinesia  . Varenicline Tartrate     REACTION: unspecified   Current Outpatient Prescriptions  Medication Sig Dispense Refill  . FLUoxetine (PROZAC) 40 MG capsule Take 40 mg by mouth 2 (two) times daily.      Marland Kitchen omeprazole (PRILOSEC) 40 MG capsule Take 1 capsule (40 mg total) by mouth 2 (two) times  daily.  180 capsule  3  . risperiDONE (RISPERDAL) 0.5 MG tablet Take 1 tablet (0.5 mg total) by mouth at bedtime.  30 tablet  1  . ALPRAZolam (XANAX) 0.5 MG tablet Take 1 tablet (0.5 mg total) by mouth 3 (three) times daily.  60 tablet  1  . HYDROcodone-acetaminophen (NORCO) 5-325 MG per tablet Take 1 tablet by mouth every 4 (four) hours as needed for moderate pain.  20 tablet  0  . mirtazapine (REMERON) 7.5 MG tablet Take 1 tablet (7.5 mg total) by mouth at bedtime.  30 tablet  3  . Multiple Vitamin (MULTIVITAMIN WITH MINERALS) TABS tablet Take 1 tablet by mouth daily.      . nicotine (NICODERM CQ - DOSED IN MG/24 HOURS) 14 mg/24hr patch Place 1 patch (14 mg total) onto the skin daily.  28 patch  0  . ondansetron (ZOFRAN ODT) 4 MG disintegrating tablet Take 1-2 tablets (4-8 mg total) by mouth every 8 (eight) hours as needed for nausea or vomiting.  20 tablet  0  . risperiDONE (RISPERDAL) 0.25 MG tablet Take 1 tablet (0.25 mg total) by mouth 2 (two) times daily.  60 tablet  1   No current facility-administered medications for this visit.       Objective:    BP 118/76  Pulse 73  Temp(Src) 97.8 F (36.6 C)  Resp 14  Ht 5' 2.75" (1.594 m)  Wt 90 lb 9.6 oz (41.096 kg)  BMI 16.17 kg/m2  SpO2 97% Physical Exam    Nursing note and vitals reviewed. Constitutional: She is oriented to person, place, and time. No distress.  Thin/underweight.  HENT:  Head: Normocephalic and atraumatic.  Mouth/Throat: Oropharynx is clear and moist.  Eyes: Conjunctivae and EOM are normal. Pupils are equal, round, and reactive to light.  Neck: Normal range of motion. Neck supple. No thyromegaly present.  Cardiovascular: Normal rate, regular rhythm and normal heart sounds.  Exam reveals no gallop and no friction rub.   No murmur heard. Pulmonary/Chest: Effort normal and breath sounds normal. No respiratory distress. She has no wheezes. She has no rales.  Abdominal: Soft. Bowel sounds are normal. She exhibits no distension and no mass. There is no tenderness. There is no rebound and no guarding.  Well healed scares on the abdomen  Musculoskeletal: Normal range of motion.  Lymphadenopathy:    She has no cervical adenopathy.  Neurological: She is alert and oriented to person, place, and time.  Skin: Skin is warm and dry. No rash noted. She is not diaphoretic.  Psychiatric: Her behavior is normal. Judgment and thought content normal. Her mood appears anxious. Her speech is tangential. Cognition and memory are normal.   Results for orders placed in visit on 02/06/14  COMPREHENSIVE METABOLIC PANEL      Result Value Ref Range   Sodium 140  135 - 145 mEq/L   Potassium 4.3  3.5 - 5.3 mEq/L   Chloride 107  96 - 112 mEq/L   CO2 20  19 - 32 mEq/L   Glucose, Bld 78  70 - 99 mg/dL   BUN 12  6 - 23 mg/dL   Creat 0.72  0.50 - 1.10 mg/dL   Total Bilirubin 0.2  0.2 - 1.2 mg/dL   Alkaline Phosphatase 71  39 - 117 U/L   AST 14  0 - 37 U/L   ALT 8  0 - 35 U/L   Total Protein 6.2  6.0 - 8.3 g/dL   Albumin 3.9  3.5 - 5.2 g/dL   Calcium 8.4  8.4 - 10.5 mg/dL  PREALBUMIN      Result Value Ref Range   Prealbumin 28.5  17.0 - 34.0 mg/dL  IRON      Result Value Ref Range   Iron 50  42 - 145 ug/dL  VITAMIN B12      Result Value Ref  Range   Vitamin B-12 527  211 - 911 pg/mL  VITAMIN D 25 HYDROXY      Result Value Ref Range   Vit D, 25-Hydroxy 28 (*) 30 - 89 ng/mL  FERRITIN      Result Value Ref Range   Ferritin 35  10 - 291 ng/mL  FOLATE      Result Value Ref Range   Folate 9.8    POCT CBC      Result Value Ref Range   WBC 8.1  4.6 - 10.2 K/uL   Lymph, poc 2.0  0.6 - 3.4   POC LYMPH PERCENT 25.3  10 - 50 %L   MID (cbc) 0.3  0 - 0.9   POC MID % 3.1  0 - 12 %M   POC Granulocyte 5.8  2 - 6.9   Granulocyte percent 71.6  37 - 80 %G   RBC 4.37  4.04 - 5.48 M/uL   Hemoglobin 12.5  12.2 - 16.2 g/dL   HCT, POC 39.5  37.7 - 47.9 %   MCV 90.5  80 - 97 fL   MCH, POC 28.6  27 - 31.2 pg   MCHC 31.6 (*) 31.8 - 35.4 g/dL   RDW, POC 16.2     Platelet Count, POC 349  142 - 424 K/uL   MPV 8.7  0 - 99.8 fL  POCT URINALYSIS DIPSTICK      Result Value Ref Range   Color, UA yellow     Clarity, UA clear     Glucose, UA neg     Bilirubin, UA neg     Ketones, UA neg     Spec Grav, UA 1.010     Blood, UA neg     pH, UA 5.0     Protein, UA neg     Urobilinogen, UA 0.2     Nitrite, UA neg     Leukocytes, UA Negative         Assessment & Plan:   1. Loss of weight   2. Severe protein-calorie malnutrition   3. Lap Nissen + truncal vagotomy July 2008   4. ANXIETY   5. Chronic duodenal ulcer with gastric outlet obstruction   6. ACUTE DUODEN ULCER W/HEMORR PERF&OBSTRUCTION   7. Chronic abdominal pain   8. Nausea alone    1. Weight loss: ongoing issue for patient and is followed closely by Dr. Hassell Done of [facility] surgery.  Will obtain labs to confirm stability of electrolytes and vitamin levels; all labs essentially normal other than slightly low vitamin D level.  Urine normal.  Highly recommend close supervision by nutritionist and Dr. Hassell Done. 2.  History of protein calorie malnutrition:  Obtained extensive labs and all overall normal. Highly recommend ongoing nutrition counseling and consultation; pt is very  worried about continue weight loss but is not engaging in ongoing nutrition guidance. 3.  Chronic nausea: chronic issue for patient.  Rx for Zofran provided.  Zofran administered during visit as well. 4.  Chronic abdominal pain: followed closely by Dr. Hassell Done of general surgery; advised pt that I will give a very  small amount of pain medication; also advised pt that she should only receive narcotics from one provider, Dr. Hassell Done.  No providers at [facility] will provide with further pain medication rx.  Pt expressed understanding; this was also documented in AVS. 5.  Anxiety: severe; secondary to chronic abdominal pain, weight loss; also secondary to family stressors.  Hesitant to adjust medications significantly due to first encounter with patient; however, recommend adding Remeron which may help with weight gain and will help with insomnia.  Close follow-up in one month with Dr. Linna Darner. Highly recommend psychotherapy to help with chronic pain, anxiety, multiple stressors.  Also highly recommend psychiatric consultation for patient for medication management; pt very reluctant to undergo psychiatric and psychological evaluations.  Recommend discussing further at follow-up visit with Dr. Linna Darner in one month.   I spent thirty minutes reviewing medical record and counseling patient regarding care.  50% of time in room was spent coordinating care and counseling patient.  Complex past medical history.  Meds ordered this encounter  Medications  . DISCONTD: ondansetron (ZOFRAN ODT) 4 MG disintegrating tablet    Sig: Take 1-2 tablets (4-8 mg total) by mouth every 8 (eight) hours as needed for nausea or vomiting.    Dispense:  20 tablet    Refill:  2  . DISCONTD: HYDROcodone-acetaminophen (NORCO) 5-325 MG per tablet    Sig: Take 1 tablet by mouth every 4 (four) hours as needed for moderate pain.    Dispense:  10 tablet    Refill:  0  . mirtazapine (REMERON) 7.5 MG tablet    Sig: Take 1 tablet (7.5 mg total) by  mouth at bedtime.    Dispense:  30 tablet    Refill:  3  . ondansetron (ZOFRAN-ODT) disintegrating tablet 8 mg    Sig:     Return in about 1 month (around 03/09/2014) for recheck with Dr. Linna Darner.    I personally performed the services described in this documentation, which was scribed in my presence. The recorded information has been reviewed and is accurate.   Reginia Forts, M.D.  [facility] [address] [place], [place]  [address] [phone] phone [phone] fax

## 2014-02-07 LAB — VITAMIN D 25 HYDROXY (VIT D DEFICIENCY, FRACTURES): VIT D 25 HYDROXY: 28 ng/mL — AB (ref 30–89)

## 2014-02-10 ENCOUNTER — Ambulatory Visit (INDEPENDENT_AMBULATORY_CARE_PROVIDER_SITE_OTHER): Payer: Medicare Other | Admitting: Surgery

## 2014-02-10 ENCOUNTER — Encounter (INDEPENDENT_AMBULATORY_CARE_PROVIDER_SITE_OTHER): Payer: Self-pay | Admitting: Surgery

## 2014-02-10 VITALS — BP 110/70 | HR 74 | Resp 18 | Ht 62.0 in | Wt 91.4 lb

## 2014-02-10 DIAGNOSIS — E43 Unspecified severe protein-calorie malnutrition: Secondary | ICD-10-CM

## 2014-02-10 MED ORDER — HYDROCODONE-ACETAMINOPHEN 5-325 MG PO TABS: 1.0000 | ORAL_TABLET | ORAL | Status: DC | PRN

## 2014-02-10 NOTE — Progress Notes (Signed)
[name] 63 y.o.  Body mass index is 16.71 kg/(m^2).  Patient Active Problem List   Diagnosis Date Noted  . Severe protein-calorie malnutrition 12/30/2013  . Constipation, chronic 07/10/2013  . Chronic abdominal pain   . COPD (chronic obstructive pulmonary disease) 11/07/2010  . DEPRESSION 06/06/2010  . LUNG NODULE 03/15/2010  . 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 CIGARETTE SMOKER 08/24/2007  . ANXIETY 01/07/2007  . BARRETT'S ESOPHAGUS, HX OF 07/03/2005    Allergies  Allergen Reactions  . Celebrex [Celecoxib] Nausea Only  . Morphine     REACTION: itching  . Quetiapine     REACTION: tardive dyskinesia  . Varenicline Tartrate     REACTION: unspecified      Past Surgical History  Procedure Laterality Date  . Esophagogastroduodenoscopy  12-29-09    dr qadeer at [facility], several gastric ulcers  . Vagotomy      lapaproscopic - nissan  . 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 Hernia repair    . Perforated ulcer    . Biopsy thyroid  08-13-11    benign nodule, per Dr. Melida Quitter   . Esophagogastroduodenoscopy N/A 09/25/2012    Procedure: ESOPHAGOGASTRODUODENOSCOPY (EGD);  Surgeon: Beryle Beams, MD;  Location: [facility];  Service: Endoscopy;  Laterality: N/A;  . Fracture surgery Bilateral     hips with screw  . Tonsillectomy    . Eye surgery Bilateral     cataract extraction with IOL  . Laparoscopic partial gastrectomy N/A 06/29/2013    Procedure: Gastrectomy and GJ Tube placement;  Surgeon: Pedro Earls, MD;  Location: [facility];  Service: General;  Laterality: N/A;   [name], MD No diagnosis found.  Weight is 91 lbs.  I gave her some chocolates to eat.  I talked to her extensively about ways to try to increase her intake. She mentioned an interest in medical marijuana. I suggested Eldertonic which she can drink at night and maybe that will help stimulate her appetite. I think more important she is to look at her relationship with the older  roommate  and their complicated stressful relationship.  She has pain and wanted a refill on her pain pills I gave her 20 Norco. I also refilled her anxiety meds.  Will see back in 7 weeks and goal weight is 98 lbs.  [name], MD, [facility] [phone] beeper [phone]  02/10/2014 11:47 AM

## 2014-02-10 NOTE — Patient Instructions (Signed)
Eldertonic take in the evening

## 2014-02-16 ENCOUNTER — Telehealth (INDEPENDENT_AMBULATORY_CARE_PROVIDER_SITE_OTHER): Payer: Self-pay

## 2014-02-16 ENCOUNTER — Other Ambulatory Visit (INDEPENDENT_AMBULATORY_CARE_PROVIDER_SITE_OTHER): Payer: Self-pay

## 2014-02-16 MED ORDER — ONDANSETRON 4 MG PO TBDP: 4.0000 mg | ORAL_TABLET | Freq: Three times a day (TID) | ORAL | Status: DC | PRN

## 2014-02-16 NOTE — Telephone Encounter (Signed)
LMOV Rx Zofran 4mg  #20 w/ no RF called to [facility].

## 2014-02-16 NOTE — Telephone Encounter (Addendum)
Pt is requesting refill of Zofran.  Dr. Hassell Done unavailable.  Pt told that request would go to our urgent office physician and we would call her when we receive a response.

## 2014-02-24 ENCOUNTER — Ambulatory Visit (INDEPENDENT_AMBULATORY_CARE_PROVIDER_SITE_OTHER): Payer: Medicare Other | Admitting: Family Medicine

## 2014-02-24 VITALS — BP 100/68 | HR 80 | Temp 97.9°F | Resp 16 | Ht 62.25 in | Wt 91.0 lb

## 2014-02-24 DIAGNOSIS — R110 Nausea: Secondary | ICD-10-CM

## 2014-02-24 DIAGNOSIS — G8929 Other chronic pain: Secondary | ICD-10-CM

## 2014-02-24 DIAGNOSIS — R1013 Epigastric pain: Secondary | ICD-10-CM

## 2014-02-24 LAB — POCT CBC
GRANULOCYTE PERCENT: 64 % (ref 37–80)
HCT, POC: 42.1 % (ref 37.7–47.9)
Hemoglobin: 12.6 g/dL (ref 12.2–16.2)
LYMPH, POC: 1.8 (ref 0.6–3.4)
MCH, POC: 28.8 pg (ref 27–31.2)
MCHC: 29.9 g/dL — AB (ref 31.8–35.4)
MCV: 96.2 fL (ref 80–97)
MID (cbc): 0.4 (ref 0–0.9)
MPV: 7.9 fL (ref 0–99.8)
PLATELET COUNT, POC: 326 10*3/uL (ref 142–424)
POC Granulocyte: 3.8 (ref 2–6.9)
POC LYMPH %: 29.6 % (ref 10–50)
POC MID %: 6.4 % (ref 0–12)
RBC: 4.38 M/uL (ref 4.04–5.48)
RDW, POC: 18.5 %
WBC: 6 10*3/uL (ref 4.6–10.2)

## 2014-02-24 MED ORDER — ONDANSETRON 4 MG PO TBDP: 4.0000 mg | ORAL_TABLET | Freq: Three times a day (TID) | ORAL | Status: DC | PRN

## 2014-02-24 MED ORDER — HYDROCODONE-ACETAMINOPHEN 5-325 MG PO TABS: 1.0000 | ORAL_TABLET | ORAL | Status: DC | PRN

## 2014-02-24 NOTE — Progress Notes (Signed)
[facility] [address] [place]  [address] [phone]  Date:  02/24/2014   Name:  [name]   DOB:  Apr 06, 1951   MRN:  [id number]  PCP:  [name], MD    Chief Complaint: Nausea and Abdominal Pain   History of Present Illness:  [name] is a 63 y.o. very pleasant female patient who presents with the following:  She is here today with chronic GI issues.  She has been using zofran ODT per her surgeon Dr. Hassell Done- he helps her with her chronic issues, she does not currently see a GI doctor.  She will see him next week. She states that she is "having a hard time pulling myself together" as she had to put her mother in the hospital recently.  "its a vicious cycle, I worry and then my stomach hurts more."  She would like more zofran and pain medication to use.  Is upset that she cannot seem to gain wait.  Admits that she does not eat much and smokes at least a PPD.   Wt Readings from Last 3 Encounters:  02/24/14 91 lb (41.277 kg)  02/10/14 91 lb 6.4 oz (41.459 kg)  02/06/14 90 lb 9.6 oz (41.096 kg)     Patient Active Problem List   Diagnosis Date Noted  . Severe protein-calorie malnutrition 12/30/2013  . Constipation, chronic 07/10/2013  . Chronic abdominal pain   . COPD (chronic obstructive pulmonary disease) 11/07/2010  . DEPRESSION 06/06/2010  . LUNG NODULE 03/15/2010  . 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 CIGARETTE SMOKER 08/24/2007  . ANXIETY 01/07/2007  . BARRETT'S ESOPHAGUS, HX OF 07/03/2005    Past Medical History  Diagnosis Date  . Peptic ulcer disease     dr Oletta Lamas  . Gastric outlet obstruction   . Acute duodenal ulcer with hemorrhage and perforation, with obstruction   . Barrett's esophagus   . Menopause   . GERD (gastroesophageal reflux disease)   . Asthma   . Chronic abdominal pain     narcotic dependence, dr gyarteng-dak at [facility]  . Narcotic abuse   . Anxiety     sees [name] NP at Dr. Radonna Ricker  office  . Fx two ribs-open 08-24-11    left 4th and 5th  . Fx of fibula 07-28-11    left fibula, 2 places   . COPD (chronic obstructive pulmonary disease)   . S/P ORIF (open reduction internal fixation) fracture     both hips pinned  . Allergy   . Anemia   . History of blood transfusion   . Lap Nissen + truncal vagotomy July 2008 05/13/2013  . LEG EDEMA 01/19/2008    Qualifier: Diagnosis of  By: [name] MD, [name]   . ACUTE DUODEN ULCER W/HEMORR PERF&OBSTRUCTION 06/26/2004    Qualifier: Diagnosis of  By: [name] MD, [name]   . FEVER, RECURRENT 08/11/2009    Qualifier: Diagnosis of  By: [name] MD, [name] MENOPAUSE 01/07/2007    Qualifier: Diagnosis of  By: [name], CMA, [place]    . Pneumonia, organism unspecified 11/07/2010    Assoc with R Parapneumonic effusion 09/2010    - Tapped 10/05/10    - CxR resolved 11/07/2010    . Gastroparesis 06/03/2007    Qualifier: Diagnosis of  By: [name] MD, [name]   . GASTRIC OUTLET OBSTRUCTION 08/28/2007    In July 2008 she underwent laparoscopic enterolysis, Nissen fundoplication over a #99 bougie,  single pledgeted suture colosure of the hiatus and a 3 suture wrap.  Lap truncal vagotomy and a loop gastrojejunostomy was performed.  This was revised in August of 2009 to a roux en Y gastrojejujostomy.     . Chronic duodenal ulcer with gastric outlet obstruction 06/29/2013    Past Surgical History  Procedure Laterality Date  . Esophagogastroduodenoscopy  12-29-09    dr qadeer at [facility], several gastric ulcers  . Vagotomy      lapaproscopic - nissan  . 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 Hernia repair    . Perforated ulcer    . Biopsy thyroid  08-13-11    benign nodule, per Dr. Melida Quitter   . Esophagogastroduodenoscopy N/A 09/25/2012    Procedure: ESOPHAGOGASTRODUODENOSCOPY (EGD);  Surgeon: Beryle Beams, MD;  Location: [facility];  Service: Endoscopy;  Laterality: N/A;  . Fracture surgery Bilateral     hips with screw  . Tonsillectomy    .  Eye surgery Bilateral     cataract extraction with IOL  . Laparoscopic partial gastrectomy N/A 06/29/2013    Procedure: Gastrectomy and GJ Tube placement;  Surgeon: Pedro Earls, MD;  Location: [facility];  Service: General;  Laterality: N/A;    History  Substance Use Topics  . Smoking status: Current Every Day Smoker -- 2.00 packs/day for 45 years    Types: Cigarettes  . Smokeless tobacco: Never Used  . Alcohol Use: No    Family History  Problem Relation Age of Onset  . Cancer Mother     kidney, magliant tumor on face  . Stroke Mother   . Celiac disease Father   . Cancer Father     kidney and pancreatic    Allergies  Allergen Reactions  . Celebrex [Celecoxib] Nausea Only  . Morphine     REACTION: itching  . Quetiapine     REACTION: tardive dyskinesia  . Varenicline Tartrate     REACTION: unspecified    Medication list has been reviewed and updated.  Current Outpatient Prescriptions on File Prior to Visit  Medication Sig Dispense Refill  . FLUoxetine (PROZAC) 40 MG capsule Take 40 mg by mouth 2 (two) times daily.      . mirtazapine (REMERON) 7.5 MG tablet Take 1 tablet (7.5 mg total) by mouth at bedtime.  30 tablet  3  . Multiple Vitamin (MULTIVITAMIN WITH MINERALS) TABS tablet Take 1 tablet by mouth daily.      . nicotine (NICODERM CQ - DOSED IN MG/24 HOURS) 14 mg/24hr patch Place 1 patch (14 mg total) onto the skin daily.  28 patch  0  . omeprazole (PRILOSEC) 40 MG capsule Take 1 capsule (40 mg total) by mouth 2 (two) times daily.  180 capsule  3  . ondansetron (ZOFRAN ODT) 4 MG disintegrating tablet Take 1-2 tablets (4-8 mg total) by mouth every 8 (eight) hours as needed for nausea or vomiting.  20 tablet  0  . risperiDONE (RISPERDAL) 0.25 MG tablet Take 1 tablet (0.25 mg total) by mouth 2 (two) times daily.  60 tablet  1  . risperiDONE (RISPERDAL) 0.5 MG tablet Take 1 tablet (0.5 mg total) by mouth at bedtime.  30 tablet  1  . ALPRAZolam (XANAX) 0.5 MG tablet Take 1  tablet (0.5 mg total) by mouth 3 (three) times daily.  60 tablet  1  . HYDROcodone-acetaminophen (NORCO) 5-325 MG per tablet Take 1 tablet by mouth every 4 (four) hours as needed  for moderate pain.  20 tablet  0   No current facility-administered medications on file prior to visit.    Review of Systems:  As per HPI- otherwise negative.   Physical Examination: Filed Vitals:   02/24/14 1616  BP: 100/68  Pulse: 80  Temp: 97.9 F (36.6 C)  Resp: 16   Filed Vitals:   02/24/14 1616  Height: 5' 2.25" (1.581 m)  Weight: 91 lb (41.277 kg)   Body mass index is 16.51 kg/(m^2). Ideal Body Weight: Weight in (lb) to have BMI = 25: 137.5  GEN: WDWN, NAD, Non-toxic, A & O x 3, very thin, smells of tobacco  HEENT: Atraumatic, Normocephalic. Neck supple. No masses, No LAD. Ears and Nose: No external deformity. CV: RRR, No M/G/R. No JVD. No thrill. No extra heart sounds. PULM: CTA B, no wheezes, crackles, rhonchi. No retractions. No resp. distress. No accessory muscle use. ABD: S, ND, +BS. No rebound. No HSM. Multiple scars.   Mild diffuse tenderness that she states is baseline  EXTR: No c/c/e NEURO Normal gait.  PSYCH: Normally interactive. Conversant. Not depressed or anxious appearing.  Calm demeanor.   Results for orders placed in visit on 02/24/14  POCT CBC      Result Value Ref Range   WBC 6.0  4.6 - 10.2 K/uL   Lymph, poc 1.8  0.6 - 3.4   POC LYMPH PERCENT 29.6  10 - 50 %L   MID (cbc) 0.4  0 - 0.9   POC MID % 6.4  0 - 12 %M   POC Granulocyte 3.8  2 - 6.9   Granulocyte percent 64.0  37 - 80 %G   RBC 4.38  4.04 - 5.48 M/uL   Hemoglobin 12.6  12.2 - 16.2 g/dL   HCT, POC 42.1  37.7 - 47.9 %   MCV 96.2  80 - 97 fL   MCH, POC 28.8  27 - 31.2 pg   MCHC 29.9 (*) 31.8 - 35.4 g/dL   RDW, POC 18.5     Platelet Count, POC 326  142 - 424 K/uL   MPV 7.9  0 - 99.8 fL    Assessment and Plan: Nausea alone - Plan: POCT CBC, Comprehensive metabolic panel, ondansetron (ZOFRAN ODT) 4 MG  disintegrating tablet, DISCONTINUED: ondansetron (ZOFRAN ODT) 4 MG disintegrating tablet  Abdominal pain, chronic, epigastric - Plan: POCT CBC, Comprehensive metabolic panel, HYDROcodone-acetaminophen (NORCO) 5-325 MG per tablet, ondansetron (ZOFRAN ODT) 4 MG disintegrating tablet, DISCONTINUED: ondansetron (ZOFRAN ODT) 4 MG disintegrating tablet  Here today with her chronic stomach issues. At this time there is no acute change or worsening in her sx- she would like to receive refills of her zofran and norco.  Did these for her as below.  Asked her to seek care if her sx change or worsen, await CMP.  A large part of her problem does seem to be depression and anxiety  Meds ordered this encounter  Medications  . DISCONTD: ondansetron (ZOFRAN ODT) 4 MG disintegrating tablet    Sig: Take 1-2 tablets (4-8 mg total) by mouth every 8 (eight) hours as needed for nausea or vomiting.    Dispense:  2030 tablet    Refill:  1  . HYDROcodone-acetaminophen (NORCO) 5-325 MG per tablet    Sig: Take 1 tablet by mouth every 4 (four) hours as needed for moderate pain.    Dispense:  20 tablet    Refill:  0  . ondansetron (ZOFRAN ODT) 4 MG disintegrating tablet  Sig: Take 1-2 tablets (4-8 mg total) by mouth every 8 (eight) hours as needed for nausea or vomiting.    Dispense:  30 tablet    Refill:  1   She will see her surgeon next week for follow-up.  Encouraged her to quit smoking, but she does not feel that she can do this.     Signed Lamar Blinks, MD

## 2014-02-24 NOTE — Patient Instructions (Signed)
I refilled your zofran and pain medication today.  I hope that you feel better soon!  I will be in touch with the rest of your labs.

## 2014-02-25 ENCOUNTER — Encounter: Payer: Self-pay | Admitting: Family Medicine

## 2014-02-25 LAB — COMPREHENSIVE METABOLIC PANEL
ALBUMIN: 3.1 g/dL — AB (ref 3.5–5.2)
ALK PHOS: 86 U/L (ref 39–117)
ALT: 10 U/L (ref 0–35)
AST: 14 U/L (ref 0–37)
BILIRUBIN TOTAL: 0.2 mg/dL (ref 0.2–1.2)
BUN: 15 mg/dL (ref 6–23)
CO2: 26 mEq/L (ref 19–32)
Calcium: 8.5 mg/dL (ref 8.4–10.5)
Chloride: 103 mEq/L (ref 96–112)
Creat: 0.68 mg/dL (ref 0.50–1.10)
Glucose, Bld: 92 mg/dL (ref 70–99)
Potassium: 4 mEq/L (ref 3.5–5.3)
Sodium: 138 mEq/L (ref 135–145)
Total Protein: 5.5 g/dL — ABNORMAL LOW (ref 6.0–8.3)

## 2014-03-03 ENCOUNTER — Other Ambulatory Visit (INDEPENDENT_AMBULATORY_CARE_PROVIDER_SITE_OTHER): Payer: Self-pay

## 2014-03-03 ENCOUNTER — Encounter (INDEPENDENT_AMBULATORY_CARE_PROVIDER_SITE_OTHER): Payer: Medicare Other | Admitting: Surgery

## 2014-03-03 DIAGNOSIS — R1013 Epigastric pain: Principal | ICD-10-CM

## 2014-03-03 DIAGNOSIS — G8929 Other chronic pain: Secondary | ICD-10-CM

## 2014-03-03 DIAGNOSIS — R110 Nausea: Secondary | ICD-10-CM

## 2014-03-03 MED ORDER — ONDANSETRON 4 MG PO TBDP: 4.0000 mg | ORAL_TABLET | Freq: Three times a day (TID) | ORAL | Status: DC | PRN

## 2014-03-03 MED ORDER — HYDROCODONE-ACETAMINOPHEN 5-325 MG PO TABS: 1.0000 | ORAL_TABLET | ORAL | Status: DC | PRN

## 2014-03-06 ENCOUNTER — Emergency Department (HOSPITAL_COMMUNITY)
Admission: EM | Admit: 2014-03-06 | Discharge: 2014-03-07 | Disposition: A | Discharge status: Home / Self Care | Payer: Medicare Other | Attending: Emergency Medicine | Admitting: Emergency Medicine

## 2014-03-06 ENCOUNTER — Encounter (HOSPITAL_COMMUNITY): Payer: Self-pay | Admitting: Emergency Medicine

## 2014-03-06 DIAGNOSIS — Z79899 Other long term (current) drug therapy: Secondary | ICD-10-CM | POA: Insufficient documentation

## 2014-03-06 DIAGNOSIS — R634 Abnormal weight loss: Secondary | ICD-10-CM | POA: Diagnosis present

## 2014-03-06 DIAGNOSIS — K262 Acute duodenal ulcer with both hemorrhage and perforation: Secondary | ICD-10-CM | POA: Insufficient documentation

## 2014-03-06 DIAGNOSIS — K2270 Barrett's esophagus without dysplasia: Secondary | ICD-10-CM | POA: Insufficient documentation

## 2014-03-06 DIAGNOSIS — J449 Chronic obstructive pulmonary disease, unspecified: Secondary | ICD-10-CM | POA: Diagnosis not present

## 2014-03-06 DIAGNOSIS — K267 Chronic duodenal ulcer without hemorrhage or perforation: Secondary | ICD-10-CM | POA: Diagnosis not present

## 2014-03-06 DIAGNOSIS — D649 Anemia, unspecified: Secondary | ICD-10-CM | POA: Insufficient documentation

## 2014-03-06 DIAGNOSIS — R1011 Right upper quadrant pain: Secondary | ICD-10-CM | POA: Insufficient documentation

## 2014-03-06 DIAGNOSIS — R1013 Epigastric pain: Secondary | ICD-10-CM | POA: Diagnosis not present

## 2014-03-06 DIAGNOSIS — G8929 Other chronic pain: Secondary | ICD-10-CM | POA: Diagnosis not present

## 2014-03-06 DIAGNOSIS — K279 Peptic ulcer, site unspecified, unspecified as acute or chronic, without hemorrhage or perforation: Secondary | ICD-10-CM | POA: Diagnosis not present

## 2014-03-06 DIAGNOSIS — F17200 Nicotine dependence, unspecified, uncomplicated: Secondary | ICD-10-CM | POA: Insufficient documentation

## 2014-03-06 DIAGNOSIS — K219 Gastro-esophageal reflux disease without esophagitis: Secondary | ICD-10-CM | POA: Insufficient documentation

## 2014-03-06 DIAGNOSIS — R110 Nausea: Secondary | ICD-10-CM

## 2014-03-06 DIAGNOSIS — Z9889 Other specified postprocedural states: Secondary | ICD-10-CM | POA: Insufficient documentation

## 2014-03-06 DIAGNOSIS — Z8781 Personal history of (healed) traumatic fracture: Secondary | ICD-10-CM | POA: Insufficient documentation

## 2014-03-06 DIAGNOSIS — F411 Generalized anxiety disorder: Secondary | ICD-10-CM | POA: Insufficient documentation

## 2014-03-06 DIAGNOSIS — R1012 Left upper quadrant pain: Secondary | ICD-10-CM | POA: Insufficient documentation

## 2014-03-06 DIAGNOSIS — K3184 Gastroparesis: Secondary | ICD-10-CM | POA: Diagnosis not present

## 2014-03-06 DIAGNOSIS — J4489 Other specified chronic obstructive pulmonary disease: Secondary | ICD-10-CM | POA: Insufficient documentation

## 2014-03-06 DIAGNOSIS — Z8701 Personal history of pneumonia (recurrent): Secondary | ICD-10-CM | POA: Diagnosis not present

## 2014-03-06 DIAGNOSIS — R109 Unspecified abdominal pain: Secondary | ICD-10-CM

## 2014-03-06 LAB — COMPREHENSIVE METABOLIC PANEL
ALT: 7 U/L (ref 0–35)
AST: 13 U/L (ref 0–37)
Albumin: 2.6 g/dL — ABNORMAL LOW (ref 3.5–5.2)
Alkaline Phosphatase: 75 U/L (ref 39–117)
Anion gap: 11 (ref 5–15)
BUN: 24 mg/dL — ABNORMAL HIGH (ref 6–23)
CALCIUM: 8 mg/dL — AB (ref 8.4–10.5)
CHLORIDE: 103 meq/L (ref 96–112)
CO2: 24 meq/L (ref 19–32)
Creatinine, Ser: 0.83 mg/dL (ref 0.50–1.10)
GFR calc Af Amer: 86 mL/min — ABNORMAL LOW (ref >90)
GFR, EST NON AFRICAN AMERICAN: 74 mL/min — AB (ref >90)
Glucose, Bld: 72 mg/dL (ref 70–99)
Potassium: 4.2 mEq/L (ref 3.7–5.3)
SODIUM: 138 meq/L (ref 137–147)
Total Protein: 5.2 g/dL — ABNORMAL LOW (ref 6.0–8.3)

## 2014-03-06 LAB — CBC WITH DIFFERENTIAL/PLATELET
BASOS ABS: 0.1 10*3/uL (ref 0.0–0.1)
Basophils Relative: 2 % — ABNORMAL HIGH (ref 0–1)
Eosinophils Absolute: 0.2 10*3/uL (ref 0.0–0.7)
Eosinophils Relative: 3 % (ref 0–5)
HCT: 33.4 % — ABNORMAL LOW (ref 36.0–46.0)
Hemoglobin: 10.7 g/dL — ABNORMAL LOW (ref 12.0–15.0)
LYMPHS PCT: 26 % (ref 12–46)
Lymphs Abs: 1.9 10*3/uL (ref 0.7–4.0)
MCH: 29.9 pg (ref 26.0–34.0)
MCHC: 32 g/dL (ref 30.0–36.0)
MCV: 93.3 fL (ref 78.0–100.0)
Monocytes Absolute: 0.6 10*3/uL (ref 0.1–1.0)
Monocytes Relative: 8 % (ref 3–12)
NEUTROS ABS: 4.3 10*3/uL (ref 1.7–7.7)
NEUTROS PCT: 61 % (ref 43–77)
PLATELETS: 344 10*3/uL (ref 150–400)
RBC: 3.58 MIL/uL — ABNORMAL LOW (ref 3.87–5.11)
RDW: 16.2 % — ABNORMAL HIGH (ref 11.5–15.5)
WBC: 7 10*3/uL (ref 4.0–10.5)

## 2014-03-06 LAB — URINALYSIS, ROUTINE W REFLEX MICROSCOPIC
Glucose, UA: NEGATIVE mg/dL
Hgb urine dipstick: NEGATIVE
Ketones, ur: NEGATIVE mg/dL
Leukocytes, UA: NEGATIVE
Nitrite: NEGATIVE
PROTEIN: NEGATIVE mg/dL
Specific Gravity, Urine: 1.028 (ref 1.005–1.030)
Urobilinogen, UA: 0.2 mg/dL (ref 0.0–1.0)
pH: 5.5 (ref 5.0–8.0)

## 2014-03-06 LAB — LIPASE, BLOOD: Lipase: 12 U/L (ref 11–59)

## 2014-03-06 MED ORDER — SODIUM CHLORIDE 0.9 % IV BOLUS (SEPSIS)
1000.0000 mL | Freq: Once | INTRAVENOUS | Status: AC
  Administered 2014-03-06: 1000 mL via INTRAVENOUS

## 2014-03-06 MED ORDER — ONDANSETRON 4 MG PO TBDP: ORAL_TABLET | ORAL | Status: DC

## 2014-03-06 MED ORDER — ONDANSETRON HCL 4 MG/2ML IJ SOLN
4.0000 mg | Freq: Once | INTRAMUSCULAR | Status: AC
  Administered 2014-03-06: 4 mg via INTRAVENOUS
  Filled 2014-03-06: qty 2

## 2014-03-06 MED ORDER — HYDROCODONE-ACETAMINOPHEN 5-325 MG PO TABS
2.0000 | ORAL_TABLET | Freq: Once | ORAL | Status: AC
  Administered 2014-03-06: 2 via ORAL
  Filled 2014-03-06: qty 2

## 2014-03-06 MED ORDER — HYDROCODONE-ACETAMINOPHEN 5-325 MG PO TABS: 1.0000 | ORAL_TABLET | Freq: Four times a day (QID) | ORAL | Status: DC | PRN

## 2014-03-06 NOTE — ED Provider Notes (Signed)
CSN: [id number]     Arrival date & time 03/06/14  1824 History   First MD Initiated Contact with Patient 03/06/14 1944     Chief Complaint  Patient presents with  . Weight Loss     (Consider location/radiation/quality/duration/timing/severity/associated sxs/prior Treatment) HPI Ms. Morlock is a 63 year old female with a extensive GI past medical history including Nissan fundoplication, Roux-en-Y gastric bypass who presents today complaining of an exacerbation of her chronic abdominal pain. Patient has been seen by multiple providers for her pain including primary care, general surgery. Patient states tonight she is concerned because she has run out of her prescribed hydrocodone, Zofran which typically helps with her pain and nausea. She states over the past 2-3 days she has been eating less to the point that yesterday and today she has only been able to get down liquid. She is concerned because she has had malnutrition in the past, and she has been unable to eat solid food for the past 2 days. She states her abdominal pain is a aching sensation that does not radiate. She states the pain is in her upper abdomen diffusely. She states the pain tonight is an 8/10. She reports associated nausea. She denies any associated vomiting, diarrhea, fever, dysuria, chest pain, shortness of breath. She states she feels as though if she were able to gain control over her symptoms, and she would be able to eat and "feel much better".  Past Medical History  Diagnosis Date  . Peptic ulcer disease     dr Oletta Lamas  . Gastric outlet obstruction   . Acute duodenal ulcer with hemorrhage and perforation, with obstruction   . Barrett's esophagus   . Menopause   . GERD (gastroesophageal reflux disease)   . Asthma   . Chronic abdominal pain     narcotic dependence, dr gyarteng-dak at [facility]  . Narcotic abuse   . Anxiety     sees [name] NP at Dr. Radonna Ricker office  . Fx two ribs-open 08-24-11    left  4th and 5th  . Fx of fibula 07-28-11    left fibula, 2 places   . COPD (chronic obstructive pulmonary disease)   . S/P ORIF (open reduction internal fixation) fracture     both hips pinned  . Allergy   . Anemia   . History of blood transfusion   . Lap Nissen + truncal vagotomy July 2008 05/13/2013  . LEG EDEMA 01/19/2008    Qualifier: Diagnosis of  By: [name] MD, [name]   . ACUTE DUODEN ULCER W/HEMORR PERF&OBSTRUCTION 06/26/2004    Qualifier: Diagnosis of  By: [name] MD, [name]   . FEVER, RECURRENT 08/11/2009    Qualifier: Diagnosis of  By: [name] MD, [name] MENOPAUSE 01/07/2007    Qualifier: Diagnosis of  By: [name], CMA, [place]    . Pneumonia, organism unspecified 11/07/2010    Assoc with R Parapneumonic effusion 09/2010    - Tapped 10/05/10    - CxR resolved 11/07/2010    . Gastroparesis 06/03/2007    Qualifier: Diagnosis of  By: [name] MD, [name]   . GASTRIC OUTLET OBSTRUCTION 08/28/2007    In July 2008 she underwent laparoscopic enterolysis, Nissen fundoplication over a #97 bougie, single pledgeted suture colosure of the hiatus and a 3 suture wrap.  Lap truncal vagotomy and a loop gastrojejunostomy was performed.  This was revised in August of 2009 to a roux en Y gastrojejujostomy.     . Chronic  duodenal ulcer with gastric outlet obstruction 06/29/2013   Past Surgical History  Procedure Laterality Date  . Esophagogastroduodenoscopy  12-29-09    dr qadeer at [facility], several gastric ulcers  . Vagotomy      lapaproscopic - nissan  . 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 Hernia repair    . Perforated ulcer    . Biopsy thyroid  08-13-11    benign nodule, per Dr. Melida Quitter   . Esophagogastroduodenoscopy N/A 09/25/2012    Procedure: ESOPHAGOGASTRODUODENOSCOPY (EGD);  Surgeon: Beryle Beams, MD;  Location: [facility];  Service: Endoscopy;  Laterality: N/A;  . Fracture surgery Bilateral     hips with screw  . Tonsillectomy    . Eye surgery Bilateral     cataract extraction  with IOL  . Laparoscopic partial gastrectomy N/A 06/29/2013    Procedure: Gastrectomy and GJ Tube placement;  Surgeon: Pedro Earls, MD;  Location: [facility];  Service: General;  Laterality: N/A;   Family History  Problem Relation Age of Onset  . Cancer Mother     kidney, magliant tumor on face  . Stroke Mother   . Celiac disease Father   . Cancer Father     kidney and pancreatic   History  Substance Use Topics  . Smoking status: Current Every Day Smoker -- 2.00 packs/day for 45 years    Types: Cigarettes  . Smokeless tobacco: Never Used  . Alcohol Use: No   OB History   Grav Para Term Preterm Abortions TAB SAB Ect Mult Living                 Review of Systems  Constitutional: Negative for fever.  HENT: Negative for trouble swallowing.   Eyes: Negative for visual disturbance.  Respiratory: Negative for shortness of breath.   Cardiovascular: Negative for chest pain.  Gastrointestinal: Positive for nausea and abdominal pain. Negative for vomiting, diarrhea and constipation.  Endocrine: Negative for polyuria.  Genitourinary: Negative for dysuria, vaginal bleeding and vaginal discharge.  Musculoskeletal: Negative for neck pain.  Skin: Negative for rash.  Neurological: Negative for dizziness, weakness and numbness.  Psychiatric/Behavioral: Negative.       Allergies  Celebrex; Morphine; Quetiapine; and Varenicline tartrate  Home Medications   Prior to Admission medications   Medication Sig Start Date End Date Taking? Authorizing Provider  ALPRAZolam [name]) 0.5 MG tablet Take 0.5 mg by mouth 3 (three) times daily as needed for anxiety.   Yes Historical Provider, MD  FLUoxetine (PROZAC) 40 MG capsule Take 40 mg by mouth 2 (two) times daily.   Yes Historical Provider, MD  mirtazapine (REMERON) 7.5 MG tablet Take 1 tablet (7.5 mg total) by mouth at bedtime. 02/06/14  Yes Wardell Honour, MD  Multiple Vitamin (MULTIVITAMIN WITH MINERALS) TABS tablet Take 1 tablet by mouth  daily.   Yes Historical Provider, MD  omeprazole (PRILOSEC) 40 MG capsule Take 1 capsule (40 mg total) by mouth 2 (two) times daily. 10/09/11  Yes [name], MD  ondansetron (ZOFRAN ODT) 4 MG disintegrating tablet Take 1-2 tablets (4-8 mg total) by mouth every 8 (eight) hours as needed for nausea or vomiting. 03/03/14  Yes Kaylyn Lim, MD  risperiDONE (RISPERDAL) 0.25 MG tablet Take 1 tablet (0.25 mg total) by mouth 2 (two) times daily. 01/05/14  Yes Kaylyn Lim, MD  risperiDONE (RISPERDAL) 0.5 MG tablet Take 1 tablet (0.5 mg total) by mouth at bedtime. 01/05/14  Yes Kaylyn Lim, MD  HYDROcodone-acetaminophen (NORCO/VICODIN) 5-325 MG per tablet Take 1-2 tablets by mouth every 6 (six) hours as needed for moderate pain or severe pain. 03/06/14   Carrie Mew, PA-C  ondansetron (ZOFRAN ODT) 4 MG disintegrating tablet 4mg  ODT q4 hours prn nausea/vomit 03/06/14   Carrie Mew, PA-C   BP 101/62  Pulse 63  Temp(Src) 98.2 F (36.8 C) (Oral)  Resp 12  SpO2 98% Physical Exam  Nursing note and vitals reviewed. Constitutional: She is oriented to person, place, and time. Vital signs are normal. No distress.  Elderly, thin, frail appearing female who is fully alert, answering questions appropriately in full, clear sentences, in no acute distress.  HENT:  Head: Normocephalic and atraumatic.  Mouth/Throat: Oropharynx is clear and moist. No oropharyngeal exudate.  Eyes: Right eye exhibits no discharge. Left eye exhibits no discharge. No scleral icterus.  Neck: Normal range of motion.  Cardiovascular: Normal rate, regular rhythm and normal heart sounds.   No murmur heard. Pulmonary/Chest: Effort normal and breath sounds normal. No respiratory distress.  Abdominal: Soft. There is tenderness in the right upper quadrant, epigastric area and left upper quadrant. There is no rigidity, no rebound, no guarding, no tenderness at McBurney's point and negative Murphy's sign.  Musculoskeletal: Normal range of  motion. She exhibits no edema and no tenderness.  Neurological: She is alert and oriented to person, place, and time. No cranial nerve deficit. Coordination normal.  Skin: Skin is warm and dry. No rash noted. She is not diaphoretic.  Psychiatric: She has a normal mood and affect.    ED Course  Procedures (including critical care time) Labs Review Labs Reviewed  CBC WITH DIFFERENTIAL - Abnormal; Notable for the following:    RBC 3.58 (*)    Hemoglobin 10.7 (*)    HCT 33.4 (*)    RDW 16.2 (*)    Basophils Relative 2 (*)    All other components within normal limits  COMPREHENSIVE METABOLIC PANEL - Abnormal; Notable for the following:    BUN 24 (*)    Calcium 8.0 (*)    Total Protein 5.2 (*)    Albumin 2.6 (*)    Total Bilirubin <0.2 (*)    GFR calc non Af Amer 74 (*)    GFR calc Af Amer 86 (*)    All other components within normal limits  URINALYSIS, ROUTINE W REFLEX MICROSCOPIC - Abnormal; Notable for the following:    Color, Urine AMBER (*)    Bilirubin Urine SMALL (*)    All other components within normal limits  LIPASE, BLOOD    Imaging Review No results found.   EKG Interpretation None       MDM   Final diagnoses:  Nausea  Chronic abdominal pain   Chronic GI issues, same tonight. States she has been able to eat less and less, and is only able to tolerate liquids for the past few days due to nausea. Nothing acute tonight, patient states she is just "feeling bad", and unable to eat due to her nausea and chronic abdominal pain. We will treat patient symptomatically, rehydrate, and reassess with a by mouth trial.  Patient's labs unremarkable other than a slightly elevated BUN and creatinine, and some mild anemia at 10.7. I suspect the change in her renal function is due to her recent dehydration and for mild anemia can potentially be due to her poor nutrition.. I discussed patient's labs with her and encouraged her to followup with her primary care physician regarding  the  abnormal labs.  Patient stating that she "feels much better". Patient is nontoxic, nonseptic appearing, in no apparent distress.  Patient's pain and other symptoms adequately managed in emergency department.  Fluid bolus given.  Labs, imaging and vitals reviewed.  Patient does not meet the SIRS or Sepsis criteria.  On repeat exam patient does not have a surgical abdomin and there are no peritoneal signs.  No indication of appendicitis, bowel obstruction, bowel perforation, cholecystitis, diverticulitis, PID or ectopic pregnancy.   Patient able to eat without difficulty. I strongly encouraged patient to follow up with her prior care physician and her GI specialist/surgeon. Patient is sent home with pain medication and Zofran for temporary relief of symptoms to facilitate her being able to eat until she can followup with primary care. Patient is agreeable to this plan. I encouraged patient to call or return to the ER should her symptoms return, worsen or should she have a questions or concerns.   BP 101/62  Pulse 63  Temp(Src) 98.2 F (36.8 C) (Oral)  Resp 12  SpO2 98%   Signed,  Dahlia Bailiff, PA-C 3:26 AM   This patient discussed with Dr. Debby Freiberg, MD    Carrie Mew, PA-C 03/07/14 [phone]

## 2014-03-06 NOTE — ED Notes (Signed)
Pt reports hx of gastroparesis and weight loss, pt sts she used to be really overweight and now is eating very little, because she feels full after few bites.

## 2014-03-06 NOTE — Discharge Instructions (Signed)
Use hydrocodone 1-2 pills every 4-6 hours as needed for pain. Use Zofran ODT every 4-8 hours as needed for nausea. Followup with primary care physician at [facility] within one week. Return to the ER if you develop fever, severe abdominal pain, nausea, vomiting, diarrhea, dizziness, weakness, altered mental status. Your hemoglobin tonight was 10.7, which is low. I encourage you to followup with your primary care physician regarding this also.  Nausea and Vomiting Nausea is a sick feeling that often comes before throwing up (vomiting). Vomiting is a reflex where stomach contents come out of your mouth. Vomiting can cause severe loss of body fluids (dehydration). Children and elderly adults can become dehydrated quickly, especially if they also have diarrhea. Nausea and vomiting are symptoms of a condition or disease. It is important to find the cause of your symptoms. CAUSES   Direct irritation of the stomach lining. This irritation can result from increased acid production (gastroesophageal reflux disease), infection, food poisoning, taking certain medicines (such as nonsteroidal anti-inflammatory drugs), alcohol use, or tobacco use.  Signals from the brain.These signals could be caused by a headache, heat exposure, an inner ear disturbance, increased pressure in the brain from injury, infection, a tumor, or a concussion, pain, emotional stimulus, or metabolic problems.  An obstruction in the gastrointestinal tract (bowel obstruction).  Illnesses such as diabetes, hepatitis, gallbladder problems, appendicitis, kidney problems, cancer, sepsis, atypical symptoms of a heart attack, or eating disorders.  Medical treatments such as chemotherapy and radiation.  Receiving medicine that makes you sleep (general anesthetic) during surgery. DIAGNOSIS Your caregiver may ask for tests to be done if the problems do not improve after a few days. Tests may also be done if symptoms are severe or if the reason for the  nausea and vomiting is not clear. Tests may include:  Urine tests.  Blood tests.  Stool tests.  Cultures (to look for evidence of infection).  X-rays or other imaging studies. Test results can help your caregiver make decisions about treatment or the need for additional tests. TREATMENT You need to stay well hydrated. Drink frequently but in small amounts.You may wish to drink water, sports drinks, clear broth, or eat frozen ice pops or gelatin dessert to help stay hydrated.When you eat, eating slowly may help prevent nausea.There are also some antinausea medicines that may help prevent nausea. HOME CARE INSTRUCTIONS   Take all medicine as directed by your caregiver.  If you do not have an appetite, do not force yourself to eat. However, you must continue to drink fluids.  If you have an appetite, eat a normal diet unless your caregiver tells you differently.  Eat a variety of complex carbohydrates (rice, wheat, potatoes, bread), lean meats, yogurt, fruits, and vegetables.  Avoid high-fat foods because they are more difficult to digest.  Drink enough water and fluids to keep your urine clear or pale yellow.  If you are dehydrated, ask your caregiver for specific rehydration instructions. Signs of dehydration may include:  Severe thirst.  Dry lips and mouth.  Dizziness.  Dark urine.  Decreasing urine frequency and amount.  Confusion.  Rapid breathing or pulse. SEEK IMMEDIATE MEDICAL CARE IF:   You have blood or brown flecks (like coffee grounds) in your vomit.  You have black or bloody stools.  You have a severe headache or stiff neck.  You are confused.  You have severe abdominal pain.  You have chest pain or trouble breathing.  You do not urinate at least once every 8 hours.  You develop cold or clammy skin.  You continue to vomit for longer than 24 to 48 hours.  You have a fever. MAKE SURE YOU:   Understand these instructions.  Will watch your  condition.  Will get help right away if you are not doing well or get worse. Document Released: 06/10/2005 Document Revised: 09/02/2011 Document Reviewed: 11/07/2010 [facility] Patient Information 2015 [place], [place]. This information is not intended to replace advice given to you by your health care provider. Make sure you discuss any questions you have with your health care provider.

## 2014-03-06 NOTE — ED Notes (Signed)
Patient is complaining stomach pain. She is also have trouble eating.

## 2014-03-14 NOTE — ED Provider Notes (Signed)
Medical screening examination/treatment/procedure(s) were performed by non-physician practitioner and as supervising physician I was immediately available for consultation/collaboration.   EKG Interpretation None        Debby Freiberg, MD 03/14/14 [phone]

## 2014-03-24 HISTORY — PX: ORIF FEMORAL NECK FRACTURE W/ DHS: SUR930

## 2014-04-01 ENCOUNTER — Other Ambulatory Visit (INDEPENDENT_AMBULATORY_CARE_PROVIDER_SITE_OTHER): Payer: Self-pay | Admitting: Surgery

## 2014-04-01 ENCOUNTER — Encounter (INDEPENDENT_AMBULATORY_CARE_PROVIDER_SITE_OTHER): Payer: Medicare Other | Admitting: Surgery

## 2014-04-01 MED ORDER — HYDROCODONE-ACETAMINOPHEN 5-325 MG PO TABS: 1.0000 | ORAL_TABLET | Freq: Four times a day (QID) | ORAL | Status: DC | PRN

## 2014-04-01 MED ORDER — ALPRAZOLAM 0.5 MG PO TABS: 0.5000 mg | ORAL_TABLET | Freq: Three times a day (TID) | ORAL | Status: DC | PRN

## 2014-04-01 NOTE — Progress Notes (Signed)
Patient came for nurse only visit.  Weight is stable.  Patient requested refill of her pain med and anxiety med. Kaylyn Lim

## 2014-04-15 ENCOUNTER — Emergency Department (HOSPITAL_COMMUNITY)
Admission: EM | Admit: 2014-04-15 | Discharge: 2014-04-15 | Disposition: A | Discharge status: Home / Self Care | Payer: Medicare Other | Attending: Emergency Medicine | Admitting: Emergency Medicine

## 2014-04-15 ENCOUNTER — Encounter (HOSPITAL_COMMUNITY): Payer: Self-pay | Admitting: Emergency Medicine

## 2014-04-15 DIAGNOSIS — Z780 Asymptomatic menopausal state: Secondary | ICD-10-CM | POA: Insufficient documentation

## 2014-04-15 DIAGNOSIS — G8929 Other chronic pain: Secondary | ICD-10-CM | POA: Diagnosis not present

## 2014-04-15 DIAGNOSIS — Z79899 Other long term (current) drug therapy: Secondary | ICD-10-CM | POA: Diagnosis not present

## 2014-04-15 DIAGNOSIS — Z720 Tobacco use: Secondary | ICD-10-CM | POA: Insufficient documentation

## 2014-04-15 DIAGNOSIS — F419 Anxiety disorder, unspecified: Secondary | ICD-10-CM | POA: Insufficient documentation

## 2014-04-15 DIAGNOSIS — D649 Anemia, unspecified: Secondary | ICD-10-CM | POA: Insufficient documentation

## 2014-04-15 DIAGNOSIS — Z8781 Personal history of (healed) traumatic fracture: Secondary | ICD-10-CM | POA: Diagnosis not present

## 2014-04-15 DIAGNOSIS — Z8701 Personal history of pneumonia (recurrent): Secondary | ICD-10-CM | POA: Insufficient documentation

## 2014-04-15 DIAGNOSIS — J45909 Unspecified asthma, uncomplicated: Secondary | ICD-10-CM | POA: Insufficient documentation

## 2014-04-15 DIAGNOSIS — Z9884 Bariatric surgery status: Secondary | ICD-10-CM | POA: Diagnosis not present

## 2014-04-15 DIAGNOSIS — Z967 Presence of other bone and tendon implants: Secondary | ICD-10-CM | POA: Diagnosis not present

## 2014-04-15 DIAGNOSIS — K219 Gastro-esophageal reflux disease without esophagitis: Secondary | ICD-10-CM | POA: Insufficient documentation

## 2014-04-15 DIAGNOSIS — R634 Abnormal weight loss: Secondary | ICD-10-CM | POA: Diagnosis present

## 2014-04-15 DIAGNOSIS — E43 Unspecified severe protein-calorie malnutrition: Secondary | ICD-10-CM | POA: Insufficient documentation

## 2014-04-15 DIAGNOSIS — J449 Chronic obstructive pulmonary disease, unspecified: Secondary | ICD-10-CM | POA: Diagnosis not present

## 2014-04-15 LAB — COMPREHENSIVE METABOLIC PANEL
ALT: 9 U/L (ref 0–35)
AST: 14 U/L (ref 0–37)
Albumin: 3.2 g/dL — ABNORMAL LOW (ref 3.5–5.2)
Alkaline Phosphatase: 95 U/L (ref 39–117)
Anion gap: 9 (ref 5–15)
BUN: 14 mg/dL (ref 6–23)
CO2: 26 mEq/L (ref 19–32)
Calcium: 8.8 mg/dL (ref 8.4–10.5)
Chloride: 101 mEq/L (ref 96–112)
Creatinine, Ser: 0.76 mg/dL (ref 0.50–1.10)
GFR calc Af Amer: >90 mL/min (ref >90)
GFR calc non Af Amer: 89 mL/min — ABNORMAL LOW (ref >90)
Glucose, Bld: 83 mg/dL (ref 70–99)
Potassium: 4.8 mEq/L (ref 3.7–5.3)
Sodium: 136 mEq/L — ABNORMAL LOW (ref 137–147)
Total Bilirubin: 0.2 mg/dL — ABNORMAL LOW (ref 0.3–1.2)
Total Protein: 6.2 g/dL (ref 6.0–8.3)

## 2014-04-15 LAB — URINALYSIS, ROUTINE W REFLEX MICROSCOPIC
Bilirubin Urine: NEGATIVE
Glucose, UA: NEGATIVE mg/dL
Hgb urine dipstick: NEGATIVE
Ketones, ur: NEGATIVE mg/dL
Leukocytes, UA: NEGATIVE
Nitrite: NEGATIVE
Protein, ur: NEGATIVE mg/dL
Specific Gravity, Urine: 1.023 (ref 1.005–1.030)
Urobilinogen, UA: 0.2 mg/dL (ref 0.0–1.0)
pH: 5 (ref 5.0–8.0)

## 2014-04-15 LAB — CBC WITH DIFFERENTIAL/PLATELET
Basophils Absolute: 0.1 10*3/uL (ref 0.0–0.1)
Basophils Relative: 2 % — ABNORMAL HIGH (ref 0–1)
Eosinophils Absolute: 0.2 10*3/uL (ref 0.0–0.7)
Eosinophils Relative: 4 % (ref 0–5)
HCT: 40.2 % (ref 36.0–46.0)
Hemoglobin: 12.7 g/dL (ref 12.0–15.0)
Lymphocytes Relative: 24 % (ref 12–46)
Lymphs Abs: 1.6 10*3/uL (ref 0.7–4.0)
MCH: 30.4 pg (ref 26.0–34.0)
MCHC: 31.6 g/dL (ref 30.0–36.0)
MCV: 96.2 fL (ref 78.0–100.0)
Monocytes Absolute: 0.5 10*3/uL (ref 0.1–1.0)
Monocytes Relative: 7 % (ref 3–12)
Neutro Abs: 4.2 10*3/uL (ref 1.7–7.7)
Neutrophils Relative %: 63 % (ref 43–77)
Platelets: 332 10*3/uL (ref 150–400)
RBC: 4.18 MIL/uL (ref 3.87–5.11)
RDW: 14 % (ref 11.5–15.5)
WBC: 6.7 10*3/uL (ref 4.0–10.5)

## 2014-04-15 MED ORDER — ONDANSETRON HCL 4 MG/2ML IJ SOLN
4.0000 mg | Freq: Once | INTRAMUSCULAR | Status: DC
  Filled 2014-04-15: qty 2

## 2014-04-15 NOTE — ED Notes (Signed)
Per pt states history of gastroparesis-had surgery months ago-increased weight loss over last 4 months-states she has no trouble eating just cant put on weight-increase weakness

## 2014-04-15 NOTE — ED Notes (Signed)
Pt escorted to discharge window. Verbalized understanding discharge instructions. In no acute distress.   

## 2014-04-15 NOTE — ED Notes (Signed)
Two attempts made for iv start.

## 2014-04-15 NOTE — ED Provider Notes (Signed)
CSN: [id number]     Arrival date & time 04/15/14  1110 History   First MD Initiated Contact with Patient 04/15/14 1311     Chief Complaint  Patient presents with  . increased weight loss      (Consider location/radiation/quality/duration/timing/severity/associated sxs/prior Treatment) HPI   64 y.o. female who underwent laparoscopy with enteral lysis, open subtotal gastrectomy and conversion to Roux-en-Y gastrojejunostomy and jejunostomy tube placement in Jan 2015 for . Chronic peptic ulcer disease without with obstruction and gastric stasis. She has struggled as she insisted on her feeding tube removal earlier this year. Admitted this past July for increasing weight loss/malnutrition. She reports he weight has continued to decline. She is telling me she is eating though which her roommate concurs with. Pt tells me she drinks Boost although her roommate says she never sees her if she actually is. She reports her weight has recently been as low as 84 lbs on her scale at home.   Past Medical History  Diagnosis Date  . Peptic ulcer disease     dr Oletta Lamas  . Gastric outlet obstruction   . Acute duodenal ulcer with hemorrhage and perforation, with obstruction   . Barrett's esophagus   . Menopause   . GERD (gastroesophageal reflux disease)   . Asthma   . Chronic abdominal pain     narcotic dependence, dr gyarteng-dak at [facility]  . Narcotic abuse   . Anxiety     sees [name] NP at Dr. Radonna Ricker office  . Fx two ribs-open 08-24-11    left 4th and 5th  . Fx of fibula 07-28-11    left fibula, 2 places   . COPD (chronic obstructive pulmonary disease)   . S/P ORIF (open reduction internal fixation) fracture     both hips pinned  . Allergy   . Anemia   . History of blood transfusion   . Lap Nissen + truncal vagotomy July 2008 05/13/2013  . LEG EDEMA 01/19/2008    Qualifier: Diagnosis of  By: [name] MD, [name]   . ACUTE DUODEN ULCER W/HEMORR PERF&OBSTRUCTION 06/26/2004   Qualifier: Diagnosis of  By: [name] MD, [name]   . FEVER, RECURRENT 08/11/2009    Qualifier: Diagnosis of  By: [name] MD, [name] MENOPAUSE 01/07/2007    Qualifier: Diagnosis of  By: [name], CMA, [place]    . Pneumonia, organism unspecified 11/07/2010    Assoc with R Parapneumonic effusion 09/2010    - Tapped 10/05/10    - CxR resolved 11/07/2010    . Gastroparesis 06/03/2007    Qualifier: Diagnosis of  By: [name] MD, [name]   . GASTRIC OUTLET OBSTRUCTION 08/28/2007    In July 2008 she underwent laparoscopic enterolysis, Nissen fundoplication over a #08 bougie, single pledgeted suture colosure of the hiatus and a 3 suture wrap.  Lap truncal vagotomy and a loop gastrojejunostomy was performed.  This was revised in August of 2009 to a roux en Y gastrojejujostomy.     . Chronic duodenal ulcer with gastric outlet obstruction 06/29/2013   Past Surgical History  Procedure Laterality Date  . Esophagogastroduodenoscopy  12-29-09    dr qadeer at [facility], several gastric ulcers  . Vagotomy      lapaproscopic - nissan  . 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 Hernia repair    . Perforated ulcer    . Biopsy thyroid  08-13-11    benign nodule, per Dr. Melida Quitter   .  Esophagogastroduodenoscopy N/A 09/25/2012    Procedure: ESOPHAGOGASTRODUODENOSCOPY (EGD);  Surgeon: Beryle Beams, MD;  Location: [facility];  Service: Endoscopy;  Laterality: N/A;  . Fracture surgery Bilateral     hips with screw  . Tonsillectomy    . Eye surgery Bilateral     cataract extraction with IOL  . Laparoscopic partial gastrectomy N/A 06/29/2013    Procedure: Gastrectomy and GJ Tube placement;  Surgeon: Pedro Earls, MD;  Location: [facility];  Service: General;  Laterality: N/A;   Family History  Problem Relation Age of Onset  . Cancer Mother     kidney, magliant tumor on face  . Stroke Mother   . Celiac disease Father   . Cancer Father     kidney and pancreatic   History  Substance Use Topics  . Smoking  status: Current Every Day Smoker -- 2.00 packs/day for 45 years    Types: Cigarettes  . Smokeless tobacco: Never Used  . Alcohol Use: No   OB History   Grav Para Term Preterm Abortions TAB SAB Ect Mult Living                 Review of Systems  All systems reviewed and negative, other than as noted in HPI.   Allergies  Celebrex; Morphine; Quetiapine; and Varenicline tartrate  Home Medications   Prior to Admission medications   Medication Sig Start Date End Date Taking? Authorizing Provider  ALPRAZolam [name]) 0.5 MG tablet Take 1 tablet (0.5 mg total) by mouth 3 (three) times daily as needed for anxiety. 04/01/14  Yes Pedro Earls, MD  FLUoxetine (PROZAC) 40 MG capsule Take 40 mg by mouth 2 (two) times daily.   Yes Historical Provider, MD  HYDROcodone-acetaminophen (NORCO/VICODIN) 5-325 MG per tablet Take 1-2 tablets by mouth every 6 (six) hours as needed for moderate pain or severe pain. 04/01/14  Yes Pedro Earls, MD  mirtazapine (REMERON) 7.5 MG tablet Take 1 tablet (7.5 mg total) by mouth at bedtime. 02/06/14  Yes Wardell Honour, MD  Multiple Vitamin (MULTIVITAMIN WITH MINERALS) TABS tablet Take 1 tablet by mouth daily.   Yes Historical Provider, MD  Multiple Vitamins-Minerals (ELDERTONIC PO) Take 1 tablet by mouth 3 (three) times daily.   Yes Historical Provider, MD  omeprazole (PRILOSEC) 40 MG capsule Take 1 capsule (40 mg total) by mouth 2 (two) times daily. 10/09/11  Yes [name], MD  ondansetron (ZOFRAN ODT) 4 MG disintegrating tablet 4mg  ODT q4 hours prn nausea/vomit 03/06/14  Yes Carrie Mew, PA-C  risperiDONE (RISPERDAL) 0.25 MG tablet Take 1 tablet (0.25 mg total) by mouth 2 (two) times daily. 01/05/14  Yes Pedro Earls, MD  risperiDONE (RISPERDAL) 0.5 MG tablet Take 1 tablet (0.5 mg total) by mouth at bedtime. 01/05/14  Yes Pedro Earls, MD   BP 130/75  Pulse 70  Temp(Src) 98.8 F (37.1 C) (Oral)  Resp 18  SpO2 100% Physical Exam  Nursing note and  vitals reviewed. Constitutional: No distress.  Frail/cachectic. NAD.   HENT:  Head: Normocephalic and atraumatic.  Eyes: Conjunctivae are normal. Right eye exhibits no discharge. Left eye exhibits no discharge.  Neck: Neck supple.  Cardiovascular: Normal rate, regular rhythm and normal heart sounds.  Exam reveals no gallop and no friction rub.   No murmur heard. Pulmonary/Chest: Effort normal and breath sounds normal. No respiratory distress.  Abdominal: Soft. She exhibits no distension. There is no tenderness.  Healed surgical scars. NT/ND.   Musculoskeletal:  She exhibits no edema and no tenderness.  Neurological: She is alert.  Skin: Skin is warm and dry.  Psychiatric: She has a normal mood and affect. Her behavior is normal. Thought content normal.    ED Course  Procedures (including critical care time) Labs Review Labs Reviewed  CBC WITH DIFFERENTIAL - Abnormal; Notable for the following:    Basophils Relative 2 (*)    All other components within normal limits  COMPREHENSIVE METABOLIC PANEL - Abnormal; Notable for the following:    Sodium 136 (*)    Albumin 3.2 (*)    Total Bilirubin 0.2 (*)    GFR calc non Af Amer 89 (*)    All other components within normal limits  URINALYSIS, ROUTINE W REFLEX MICROSCOPIC    Imaging Review No results found.   EKG Interpretation None      MDM   Final diagnoses:  Severe protein-calorie malnutrition    63yF with severe malnutrition. Ongoing battle. No acute issues. Discussed with gen surgery . Can seen in office next week and to see nutritionist.     Virgel Manifold, MD 04/18/14 1302

## 2014-05-04 ENCOUNTER — Telehealth: Payer: Self-pay | Admitting: Family Medicine

## 2014-05-04 NOTE — Telephone Encounter (Signed)
That is fine with me.

## 2014-05-04 NOTE — Telephone Encounter (Signed)
Patient would like to transfer from Dr. [name] to Elyn Aquas. Is this ok? Thanks!  Best # to call patient back is [phone]

## 2014-05-04 NOTE — Telephone Encounter (Signed)
Okay with me 

## 2014-05-05 NOTE — Telephone Encounter (Signed)
Left message informing patient of this and to call our office to schedule appointment

## 2014-05-13 ENCOUNTER — Ambulatory Visit: Payer: Medicare Other | Admitting: Physician Assistant

## 2014-06-02 ENCOUNTER — Ambulatory Visit (INDEPENDENT_AMBULATORY_CARE_PROVIDER_SITE_OTHER): Payer: Medicare Other | Admitting: Family Medicine

## 2014-06-02 VITALS — BP 130/70 | HR 66 | Temp 98.3°F | Resp 18 | Ht 63.0 in | Wt 90.4 lb

## 2014-06-02 DIAGNOSIS — F411 Generalized anxiety disorder: Secondary | ICD-10-CM

## 2014-06-02 DIAGNOSIS — R109 Unspecified abdominal pain: Secondary | ICD-10-CM

## 2014-06-02 DIAGNOSIS — G8929 Other chronic pain: Secondary | ICD-10-CM

## 2014-06-02 DIAGNOSIS — E43 Unspecified severe protein-calorie malnutrition: Secondary | ICD-10-CM

## 2014-06-02 DIAGNOSIS — F331 Major depressive disorder, recurrent, moderate: Secondary | ICD-10-CM

## 2014-06-02 MED ORDER — ONDANSETRON 4 MG PO TBDP: ORAL_TABLET | ORAL | Status: DC

## 2014-06-02 MED ORDER — HYDROCODONE-ACETAMINOPHEN 5-325 MG PO TABS: 1.0000 | ORAL_TABLET | Freq: Four times a day (QID) | ORAL | Status: DC | PRN

## 2014-06-02 NOTE — Progress Notes (Signed)
Subjective:  This chart was scribed for [name], MD by Ladene Artist, ED Scribe. The patient was seen in room 5. Patient's care was started at 2:04 PM.   Patient ID: [name], female    DOB: 1951-04-25, 63 y.o.   MRN: [id number]  Chief Complaint  Patient presents with  . Abdominal Pain  . Fatigue  . Medication Refill    need zofran, and some pain meds.   HPI HPI Comments: [name] is a 63 y.o. female who presents to the [facility] complaining of abdominal pain. Pt has an extensive GI history. She has been seen by multiple GI doctors and is on chronic narcotics from [facility] due to chronic abdominal pain. She has had numerous issues. Please see past medical history below. Lab work last done 6 weeks previously. Showed that pt has had persistently low albumin level 2.6-3.2  Pt states that she is very frustrated because "no one is doing anything". She further reports that "no one can figure out what is wrong with my stomach". Pt states that she has had abdominal pain and nausea daily for the past 15 years. She states that her pain is so severe that it keeps her up at night. Pt also reports that she has been eating but is unable to maintain weight. Pt states that she has not seen a GI doctor in the past 5 years. She also reports that she no longer sees Dr. [name] and that [facility] is the closets to a PCP that she has. She reports that she was supposed to see a nutritionist and start protein powder but has not due to finances. Pt is not currently seeing a therapist. She states that she does not have the energy to do anything but sit around the house. She denies SI. Pt requests a refill of Zofran and medication for pain.  Past Medical History  Diagnosis Date  . Peptic ulcer disease     dr Oletta Lamas  . Gastric outlet obstruction   . Acute duodenal ulcer with hemorrhage and perforation, with obstruction   . Barrett's esophagus   . Menopause   . GERD  (gastroesophageal reflux disease)   . Asthma   . Chronic abdominal pain     narcotic dependence, dr gyarteng-dak at [facility]  . Narcotic abuse   . Anxiety     sees [name] NP at Dr. Radonna Ricker office  . Fx two ribs-open 08-24-11    left 4th and 5th  . Fx of fibula 07-28-11    left fibula, 2 places   . COPD (chronic obstructive pulmonary disease)   . S/P ORIF (open reduction internal fixation) fracture     both hips pinned  . Allergy   . Anemia   . History of blood transfusion   . Lap Nissen + truncal vagotomy July 2008 05/13/2013  . LEG EDEMA 01/19/2008    Qualifier: Diagnosis of  By: [name] MD, [name]   . ACUTE DUODEN ULCER W/HEMORR PERF&OBSTRUCTION 06/26/2004    Qualifier: Diagnosis of  By: [name] MD, [name]   . FEVER, RECURRENT 08/11/2009    Qualifier: Diagnosis of  By: [name] MD, [name] MENOPAUSE 01/07/2007    Qualifier: Diagnosis of  By: [name], CMA, [place]    . Pneumonia, organism unspecified 11/07/2010    Assoc with R Parapneumonic effusion 09/2010    - Tapped 10/05/10    - CxR resolved 11/07/2010    .  Gastroparesis 06/03/2007    Qualifier: Diagnosis of  By: [name] MD, [name]   . GASTRIC OUTLET OBSTRUCTION 08/28/2007    In July 2008 she underwent laparoscopic enterolysis, Nissen fundoplication over a #78 bougie, single pledgeted suture colosure of the hiatus and a 3 suture wrap.  Lap truncal vagotomy and a loop gastrojejunostomy was performed.  This was revised in August of 2009 to a roux en Y gastrojejujostomy.     . 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 ALPRAZolam (XANAX) 0.5 MG tablet Take 1 tablet (0.5 mg total) by mouth 3 (three) times daily as needed for anxiety. 30 tablet 0  . FLUoxetine (PROZAC) 40 MG capsule Take 40 mg by mouth 2 (two) times daily.    Marland Kitchen HYDROcodone-acetaminophen (NORCO/VICODIN) 5-325 MG per tablet Take 1-2 tablets by mouth  every 6 (six) hours as needed for moderate pain or severe pain. 30 tablet 0  . Multiple Vitamin (MULTIVITAMIN WITH MINERALS) TABS tablet Take 1 tablet by mouth daily.    . Multiple Vitamins-Minerals (ELDERTONIC PO) Take 1 tablet by mouth 3 (three) times daily.    . ondansetron (ZOFRAN ODT) 4 MG disintegrating tablet 4mg  ODT q4 hours prn nausea/vomit 24 tablet 0  . risperiDONE (RISPERDAL) 0.25 MG tablet Take 1 tablet (0.25 mg total) by mouth 2 (two) times daily. 60 tablet 1  . risperiDONE (RISPERDAL) 0.5 MG tablet Take 1 tablet (0.5 mg total) by mouth at bedtime. 30 tablet 1   No current facility-administered medications on file prior to visit.   Allergies  Allergen Reactions  . Celebrex [Celecoxib] Nausea Only  . Morphine     REACTION: itching  . Quetiapine     REACTION: tardive dyskinesia  . Varenicline Tartrate     REACTION: unspecified   Review of Systems  Constitutional: Positive for fatigue.  Gastrointestinal: Positive for abdominal pain.  Psychiatric/Behavioral: Negative for suicidal ideas.   BP 130/70 mmHg  Pulse 66  Temp(Src) 98.3 F (36.8 C) (Oral)  Resp 18  Ht 5\' 3"  (1.6 m)  Wt 90 lb 6.4 oz (41.005 kg)  BMI 16.02 kg/m2  SpO2 98%     Objective:   Physical Exam  Constitutional: She is oriented to person, place, and time. She appears well-developed and well-nourished. No distress.  HENT:  Head: Normocephalic and atraumatic.  Eyes: Conjunctivae and EOM are normal.  Neck: Neck supple. No tracheal deviation present.  Cardiovascular: Normal rate.   Pulmonary/Chest: Effort normal. No respiratory distress.  Musculoskeletal: Normal range of motion.  Neurological: She is alert and oriented to person, place, and time.  Skin: Skin is warm and dry.  Psychiatric: She has a normal mood and affect. Her behavior is normal.  Nursing note and vitals reviewed.     Assessment & Plan:  [place] [facility] Database reviewed. Pt has been receiving chornic Hydrocodone 5 and Alprazolam 0.5  from Dr. Hassell Done. Hydrocodone last given 10/14 #45 and Alprazolam 10/29 #60.  Anxiety state  Chronic abdominal pain - reviewed [facility] controlled sub policy - offered referral to a pain management clinic which pt declines today. Reminded pt that she needs to est w/ a PCP here and whether they will refill her narcotics or also encourage her to f/u w/ pain management for this will be up to her PCP. Pt adament that she has been receiving her chronic narcotic therapy from [facility] but  [facility] does not seem to  correlate with this report.  Recommend pt see GI again but perhaps try a tertiary care for her chronic abd pain since etiology is unknown and was last evaluated many years prior - now there are new medications and perhaps even different availability of GI functional studies but pt also declines - she feels adamant that they would have nothing to offer her.  Major depressive disorder, recurrent episode, moderate  Severe protein-calorie malnutrition w/ weightloss - recommend trying food diary and referral to nutrition but pt declined today.  Recommend pt consider trial of remeron - could even consider trying megace but pt declines.  Pt very upset that nobody will help her, that she has not been able to identify a cure - feels like she has been written of - I encouraged pt to be her own advocate - that she needs to establish w/ a provider who she likes and trusts and cont to ask for and seek out help - encouraged pt to contact her surgeon - Dr. Hassell Done - to discuss if she would be a candidate for a PEG due to the severity and morbidity of her symptoms. Meds ordered this encounter  Medications  . ondansetron (ZOFRAN ODT) 4 MG disintegrating tablet    Sig: 4mg  ODT q4 hours prn nausea/vomit    Dispense:  30 tablet    Refill:  0  . HYDROcodone-acetaminophen (NORCO/VICODIN) 5-325 MG per tablet    Sig: Take 1 tablet by mouth every 6 (six) hours as needed for moderate pain or severe pain.    Dispense:  20 tablet     Refill:  0    I personally performed the services described in this documentation, which was scribed in my presence. The recorded information has been reviewed and considered, and addended by me as needed.  [name], MD MPH

## 2014-06-02 NOTE — Patient Instructions (Signed)
Your weight loss and degree of nutrition is very concerning and if continues I agree that your immune system and your body will have more trouble functioning.  You need to keep a food diary and see a nutritionist - I recommend Dr. Iver Nestle with the [facility] or the [place] dieticians with [facility]- if you would like referral to either of these let me know.   Also, I would recommend a repeat GI eval since it has been years since you have been seen to see if there are any new therapies that you could respond to. Certainly tertiary care centers such as [facility], [facility], [facility] tend to have more specified specialsts - [facility] tends to have the GI center that does the most detailed and new developments in nutrition and absorption.  If you would like to pursue this, let me know. We generally do NOT provide chronic narcotics - we refer pt's to pain management. However, that will be between you and your primary provider.  If you want to establish at [facility], I would recommend scheduling an OV with the provider of your choice to review your history and ensure you are both on the same page as far as your goals of care. Some people respond to medications to increase appetite and weight such as remeron (mirtazipine) or megace though with your gastroparesis if you ate to much more, it could cause stomach pain.  I would encourage you to discuss with Dr. Hassell Done when/if it would every be a possibility to put in an intestinal port for nutrient/protein administration since your malnutrition is getting worse.    [facility] Policy for Prescribing Controlled Substances (Revised 04/2012) 1. Prescriptions for controlled substances will be filled by ONE provider at [facility] with whom you have established and developed a plan for your care, including follow-up. 2. You are encouraged to schedule an appointment with your prescriber at our appointment center for follow-up visits whenever possible. 3. If you  request a prescription for the controlled substance while at [facility] for an acute problem (with someone other than your regular prescriber), you MAY be given a ONE-TIME prescription for a 30-day supply of the controlled substance, to allow time for you to return to see your regular prescriber for additional prescriptions.   Malnutrition Many of [place] think of malnutrition as a condition in which there is not enough to eat. Malnutrition is actually any condition where nutrition is poor. This means:  Too much to eat as we see in conditions of obesity.  Too little to eat with starvation. The following information is only for the malnourished with dietary deficiencies (poor diet). CAUSES  Under-nutrition can result from:  Poor intake.  Malabsorption  Lactation  Bleeding  Diarrhea  Old age.  Kidney failure.  Infancy  Poverty  Infection  Adolescence  Excessive sweating  Drug addiction  Pregnancy  Early childhood. Under-nutrition comes anytime the demand is more than the intake. SYMPTOMS  The problems depend on what type of malnutrition is present. Some general symptoms include:  Fatigue.  Dizziness.  Fainting  Weight loss.  Poor immune response.  Lack of menstruation.  Lack of growth in children.  Hair loss. DIAGNOSIS  Your caregiver will usually suspect malnutrition based on results of your:   Medical and dietary history.  Physical exam. This will often include measurements of your BMI (body mass index).  Perhaps some blood tests. These may include: plasma levels of nutrients and nutrient-dependent substances, such as:  Hemoglobin  Thyroid hormones  Transferrin  Albumin RISK FACTORS Persons in the following circumstances may be at risk of malnutrition.  Infants and children are at risk of under-nutrition. This is because of their high demand for energy and essential nutrients. Protein-energy malnutrition in children consuming inadequate amounts of  protein, calories, and other nutrients is a particularly severe form of under-nutrition that delays growth and development. This includes [place] and [place].  Hemorrhagic disease of the newborn is a life-threatening disorder. This is due to a lack of vitamin K, iron, folic acid, vitamin C, copper, zinc, and vitamin A. This may occur in inadequately fed infants and children.  In adolescence, nutritional requirements increase because they are growing. Anorexia nervosa, a form of starvation, may affect adolescents.  Pregnancy and lactation. Requirements for all nutrients are increased during pregnancy and lactation.  Abnormal diets, such as pica (the consumption of nonnutritive substances, such as clay and charcoal), are common in pregnancy.  Anemia due to folic acid deficiency is common in pregnant women. This is especially true for those who have taken oral contraceptives. Folic acid supplements are now recommended for pregnant women. Folic acid prevents neural tube defects (spina bifida) in children.  Breast-fed-only infants may develop vitamin B12 deficiency if the mother is a vegan.  An alcoholic mother may have a handicapped and stunted child with fetal alcohol syndrome. This is due to the effects of alcohol on the fetus. Do not drink during pregnancy.  Old age: A weakened sense of taste and smell, loneliness, physical and mental handicaps, immobility, and chronic illness can hurt the food intake in the elderly. Absorption is reduced. This may add to iron deficiency, calcium and bone problems and also a softening of the bones due to lack of vitamin D. This is also made worse by not being in the sun.  With aging, we loose lean body mass. These changes and a reduction in physical activity result in lower energy and protein requirements compared with those of younger adults.  Chronic disease including malabsorption states (including those resulting from surgery) tend to impair the  absorption of fat-soluble vitamins, vitamin B12, calcium, and iron.  Liver disease impairs the storage of vitamins A and B12. It also interferes with the metabolism of protein and energy sources.  Kidney disease may cause deficiencies of protein, iron, and vitamin D.  Cancer and AIDS may cause anorexia. This is a loss of appetite.  Vegetarian diet. The most common form of this type of diet is when meat and fish are not eaten, but eggs and dairy products are eaten. Iron deficiency is the only risk. Ovo-lacto vegetarians tend to live longer and to develop fewer chronic disabling conditions than their meat-eating peers. However, their lifestyle usually includes regular exercise and abstention from alcohol and tobacco. This may contribute to better health. Vegans consume no animal products and are susceptible to vitamin B12 deficiency. Yeast extracts and oriental-style fermented foods provide this vitamin. Intake of calcium, iron, and zinc also tends to be low. A fruitarian diet (eat only fruit) is deficient in protein, salt, and many micronutrients. This is not recommended.  Fad diets: Many commercial diets are claimed to enhance well-being or reduce weight. A physician should be alert to early evidence of nutrient deficiency or toxicity in patients on these diets. Such diets have resulted in vitamin, mineral, and protein deficiency states and cardiac, renal, and metabolic disorders. Some fad diets have resulted in death. People on very low calorie diets (less than 400  kcal/day) cannot sustain health for long. Some trace mineral supplements have induced toxicity.  Alcohol or drug dependency: Addiction leads to a troubled lifestyle in which adequate nourishment is ignored. Absorption and metabolism of nutrients are impaired. High levels of alcohol are poisonous. Too much alcohol can cause tissue injury, particularly of the GI tract, liver, pancreas, brain, and peripheral nervous system. Beer drinkers who  consume food may gain weight, but alcoholics who use more than one quart of hard liquor per day lose weight and become undernourished. Drug addicts are usually very skinny. Alcoholism is the most common cause of thiamine deficiency and may lead to deficiencies of magnesium, zinc, and other vitamins. TREATMENT  Get treatment if you experience changes in how your body is working.  PREVENTION  Eating a good, well-balanced diet helps to prevent most forms of malnutrition. Document Released: 04/26/2005 Document Revised: 09/02/2011 Document Reviewed: 05/18/2007 [facility] Patient Information 2015 [place], [place]. This information is not intended to replace advice given to you by your health care provider. Make sure you discuss any questions you have with your health care provider.

## 2014-06-09 ENCOUNTER — Other Ambulatory Visit (INDEPENDENT_AMBULATORY_CARE_PROVIDER_SITE_OTHER): Payer: Self-pay | Admitting: Surgery

## 2014-06-09 DIAGNOSIS — E43 Unspecified severe protein-calorie malnutrition: Secondary | ICD-10-CM

## 2014-06-09 NOTE — Progress Notes (Signed)
Patient seen in [place] office and arrangements made for CT to determine if old jejunostomy tract can be used for new percutanous jejunostomy  Kaylyn Lim, MD, FACS

## 2014-06-23 ENCOUNTER — Ambulatory Visit (HOSPITAL_COMMUNITY)
Admission: RE | Admit: 2014-06-23 | Discharge: 2014-06-23 | Disposition: A | Discharge status: Home / Self Care | Payer: Medicare Other | Source: Ambulatory Visit | Attending: Surgery | Admitting: Surgery

## 2014-06-23 DIAGNOSIS — Z48815 Encounter for surgical aftercare following surgery on the digestive system: Secondary | ICD-10-CM | POA: Diagnosis present

## 2014-06-23 DIAGNOSIS — E43 Unspecified severe protein-calorie malnutrition: Secondary | ICD-10-CM

## 2014-06-23 LAB — POCT I-STAT CREATININE: Creatinine, Ser: 0.8 mg/dL (ref 0.50–1.10)

## 2014-06-23 MED ORDER — IOHEXOL 300 MG/ML  SOLN
100.0000 mL | Freq: Once | INTRAMUSCULAR | Status: AC | PRN
  Administered 2014-06-23: 70 mL via INTRAVENOUS

## 2014-06-28 DIAGNOSIS — H5201 Hypermetropia, right eye: Secondary | ICD-10-CM | POA: Diagnosis not present

## 2014-06-28 DIAGNOSIS — H52223 Regular astigmatism, bilateral: Secondary | ICD-10-CM | POA: Diagnosis not present

## 2014-06-28 DIAGNOSIS — Z961 Presence of intraocular lens: Secondary | ICD-10-CM | POA: Diagnosis not present

## 2014-06-28 DIAGNOSIS — H3531 Nonexudative age-related macular degeneration: Secondary | ICD-10-CM | POA: Diagnosis not present

## 2014-07-12 DIAGNOSIS — E46 Unspecified protein-calorie malnutrition: Secondary | ICD-10-CM | POA: Diagnosis not present

## 2014-07-18 DIAGNOSIS — R413 Other amnesia: Secondary | ICD-10-CM | POA: Insufficient documentation

## 2014-07-20 NOTE — Progress Notes (Signed)
Please put orders in Epic surgery 07-26-14 pre op 07-22-14 Thanks

## 2014-07-22 ENCOUNTER — Encounter (HOSPITAL_COMMUNITY)
Admission: RE | Admit: 2014-07-22 | Discharge: 2014-07-22 | Disposition: A | Discharge status: Home / Self Care | Payer: Medicare Other | Source: Ambulatory Visit | Attending: Surgery | Admitting: Surgery

## 2014-07-22 ENCOUNTER — Encounter (INDEPENDENT_AMBULATORY_CARE_PROVIDER_SITE_OTHER): Payer: Self-pay

## 2014-07-22 ENCOUNTER — Encounter (HOSPITAL_COMMUNITY): Payer: Self-pay

## 2014-07-22 DIAGNOSIS — Z01812 Encounter for preprocedural laboratory examination: Secondary | ICD-10-CM | POA: Diagnosis not present

## 2014-07-22 DIAGNOSIS — Z681 Body mass index (BMI) 19 or less, adult: Secondary | ICD-10-CM | POA: Diagnosis not present

## 2014-07-22 DIAGNOSIS — E46 Unspecified protein-calorie malnutrition: Secondary | ICD-10-CM | POA: Diagnosis not present

## 2014-07-22 DIAGNOSIS — Z01810 Encounter for preprocedural cardiovascular examination: Secondary | ICD-10-CM | POA: Insufficient documentation

## 2014-07-22 HISTORY — DX: Other complications of anesthesia, initial encounter: T88.59XA

## 2014-07-22 HISTORY — DX: Personal history of other diseases of the digestive system: Z87.19

## 2014-07-22 HISTORY — DX: Depression, unspecified: F32.A

## 2014-07-22 HISTORY — DX: Major depressive disorder, single episode, unspecified: F32.9

## 2014-07-22 HISTORY — DX: Adverse effect of unspecified anesthetic, initial encounter: T41.45XA

## 2014-07-22 LAB — CBC
HCT: 37.3 % (ref 36.0–46.0)
Hemoglobin: 11.6 g/dL — ABNORMAL LOW (ref 12.0–15.0)
MCH: 29.2 pg (ref 26.0–34.0)
MCHC: 31.1 g/dL (ref 30.0–36.0)
MCV: 94 fL (ref 78.0–100.0)
Platelets: 287 10*3/uL (ref 150–400)
RBC: 3.97 MIL/uL (ref 3.87–5.11)
RDW: 16.8 % — ABNORMAL HIGH (ref 11.5–15.5)
WBC: 7.5 10*3/uL (ref 4.0–10.5)

## 2014-07-22 LAB — BASIC METABOLIC PANEL
Anion gap: 7 (ref 5–15)
BUN: 13 mg/dL (ref 6–23)
CO2: 26 mmol/L (ref 19–32)
Calcium: 8.9 mg/dL (ref 8.4–10.5)
Chloride: 108 mmol/L (ref 96–112)
Creatinine, Ser: 0.68 mg/dL (ref 0.50–1.10)
GFR calc Af Amer: >90 mL/min (ref >90)
GFR calc non Af Amer: >90 mL/min (ref >90)
Glucose, Bld: 90 mg/dL (ref 70–99)
Potassium: 4.9 mmol/L (ref 3.5–5.1)
Sodium: 141 mmol/L (ref 135–145)

## 2014-07-22 NOTE — Progress Notes (Signed)
CT abd 06/23/2014 in epic

## 2014-07-22 NOTE — Progress Notes (Signed)
07-22-14 1655 Dr. Landry Dyke reviewed EKG of today -compared to 06-23-13, and 4'12 with chart, may proceed as planned.

## 2014-07-22 NOTE — Progress Notes (Signed)
No orders at time of PAT visit on 07/22/2014 need orders placed in epic Thanks

## 2014-07-22 NOTE — Patient Instructions (Addendum)
[place]  07/22/2014   Your procedure is scheduled on:    Tuesday July 26, 2014   Report to [facility] Main Entrance and follow signs to  [place] [place] arrive at [place] AM.   Call this number if you have problems the morning of surgery [phone] or Presurgical Testing [phone].   Remember:  Do not eat food or drink liquids :After Midnight.       Take these medicines the morning of surgery with A SIP OF WATER: Fluoxetine (Prozac); Omeprazole (Prilosec);Ondansetron (Zofran) if needed                               You may not have any metal on your body including hair pins and piercings  Do not wear jewelry, make-up, lotions, powders, or deodorant.  Do not shave body hair  48 hours(2 days) of CHG soap use.               Do not bring valuables to the hospital. [place].  Contacts, dentures or bridgework may not be worn into surgery.  Leave suitcase in the car. After surgery it may be brought to your room.  For patients admitted to the hospital, checkout time is 11:00 AM the day of discharge.   ________________________________________________________________________  [facility] - Preparing for Surgery Before surgery, you can play an important role.  Because skin is not sterile, your skin needs to be as free of germs as possible.  You can reduce the number of germs on your skin by washing with CHG (chlorahexidine gluconate) soap before surgery.  CHG is an antiseptic cleaner which kills germs and bonds with the skin to continue killing germs even after washing. Please DO NOT use if you have an allergy to CHG or antibacterial soaps.  If your skin becomes reddened/irritated stop using the CHG and inform your nurse when you arrive at Short Stay. Do not shave (including legs and underarms) for at least 48 hours prior to the first CHG shower.  You may shave your face/neck. Please follow these instructions carefully:  1.  Shower with  CHG Soap the night before surgery and the  morning of Surgery.  2.  If you choose to wash your hair, wash your hair first as usual with your  normal  shampoo.  3.  After you shampoo, rinse your hair and body thoroughly to remove the  shampoo.                           4.  Use CHG as you would any other liquid soap.  You can apply chg directly  to the skin and wash                       Gently with a scrungie or clean washcloth.  5.  Apply the CHG Soap to your body ONLY FROM THE NECK DOWN.   Do not use on face/ open                           Wound or open sores. Avoid contact with eyes, ears mouth and genitals (private parts).                       Wash face,  Development worker, international aid (private parts)  with your normal soap.             6.  Wash thoroughly, paying special attention to the area where your surgery  will be performed.  7.  Thoroughly rinse your body with warm water from the neck down.  8.  DO NOT shower/wash with your normal soap after using and rinsing off  the CHG Soap.                9.  Pat yourself dry with a clean towel.            10.  Wear clean pajamas.            11.  Place clean sheets on your bed the night of your first shower and do not  sleep with pets. Day of Surgery : Do not apply any lotions/deodorants the morning of surgery.  Please wear clean clothes to the hospital/surgery center.  FAILURE TO FOLLOW THESE INSTRUCTIONS MAY RESULT IN THE CANCELLATION OF YOUR SURGERY PATIENT SIGNATURE_________________________________  NURSE SIGNATURE__________________________________  ________________________________________________________________________

## 2014-07-25 ENCOUNTER — Ambulatory Visit (INDEPENDENT_AMBULATORY_CARE_PROVIDER_SITE_OTHER): Payer: Self-pay | Admitting: Surgery

## 2014-07-26 ENCOUNTER — Encounter (HOSPITAL_COMMUNITY)
Admission: RE | Disposition: A | Discharge status: Home Health | Payer: Medicare Other | Source: Ambulatory Visit | Attending: Surgery

## 2014-07-26 ENCOUNTER — Encounter (HOSPITAL_COMMUNITY): Payer: Self-pay | Admitting: *Deleted

## 2014-07-26 ENCOUNTER — Inpatient Hospital Stay (HOSPITAL_COMMUNITY): Payer: Medicare Other | Admitting: Certified Registered Nurse Anesthetist

## 2014-07-26 ENCOUNTER — Inpatient Hospital Stay (HOSPITAL_COMMUNITY)
Admission: RE | Admit: 2014-07-26 | Discharge: 2014-07-28 | DRG: 982 | Disposition: A | Discharge status: Home Health | Payer: Medicare Other | Source: Ambulatory Visit | Attending: Surgery | Admitting: Surgery

## 2014-07-26 DIAGNOSIS — K219 Gastro-esophageal reflux disease without esophagitis: Secondary | ICD-10-CM | POA: Diagnosis not present

## 2014-07-26 DIAGNOSIS — F331 Major depressive disorder, recurrent, moderate: Secondary | ICD-10-CM | POA: Diagnosis present

## 2014-07-26 DIAGNOSIS — F1120 Opioid dependence, uncomplicated: Secondary | ICD-10-CM | POA: Diagnosis not present

## 2014-07-26 DIAGNOSIS — G8929 Other chronic pain: Secondary | ICD-10-CM | POA: Diagnosis not present

## 2014-07-26 DIAGNOSIS — Z823 Family history of stroke: Secondary | ICD-10-CM

## 2014-07-26 DIAGNOSIS — E46 Unspecified protein-calorie malnutrition: Secondary | ICD-10-CM | POA: Diagnosis not present

## 2014-07-26 DIAGNOSIS — J449 Chronic obstructive pulmonary disease, unspecified: Secondary | ICD-10-CM | POA: Diagnosis present

## 2014-07-26 DIAGNOSIS — K2270 Barrett's esophagus without dysplasia: Secondary | ICD-10-CM | POA: Diagnosis present

## 2014-07-26 DIAGNOSIS — R109 Unspecified abdominal pain: Secondary | ICD-10-CM | POA: Diagnosis present

## 2014-07-26 DIAGNOSIS — Z8711 Personal history of peptic ulcer disease: Secondary | ICD-10-CM

## 2014-07-26 DIAGNOSIS — Z681 Body mass index (BMI) 19 or less, adult: Secondary | ICD-10-CM

## 2014-07-26 DIAGNOSIS — Z934 Other artificial openings of gastrointestinal tract status: Secondary | ICD-10-CM

## 2014-07-26 DIAGNOSIS — Z01812 Encounter for preprocedural laboratory examination: Secondary | ICD-10-CM | POA: Diagnosis not present

## 2014-07-26 DIAGNOSIS — J45909 Unspecified asthma, uncomplicated: Secondary | ICD-10-CM | POA: Diagnosis not present

## 2014-07-26 DIAGNOSIS — E43 Unspecified severe protein-calorie malnutrition: Secondary | ICD-10-CM | POA: Diagnosis not present

## 2014-07-26 DIAGNOSIS — K5900 Constipation, unspecified: Secondary | ICD-10-CM | POA: Diagnosis not present

## 2014-07-26 DIAGNOSIS — Z809 Family history of malignant neoplasm, unspecified: Secondary | ICD-10-CM | POA: Diagnosis not present

## 2014-07-26 DIAGNOSIS — Z01810 Encounter for preprocedural cardiovascular examination: Secondary | ICD-10-CM

## 2014-07-26 DIAGNOSIS — Z9884 Bariatric surgery status: Secondary | ICD-10-CM

## 2014-07-26 DIAGNOSIS — R64 Cachexia: Secondary | ICD-10-CM | POA: Diagnosis not present

## 2014-07-26 DIAGNOSIS — F17210 Nicotine dependence, cigarettes, uncomplicated: Secondary | ICD-10-CM | POA: Diagnosis not present

## 2014-07-26 DIAGNOSIS — F419 Anxiety disorder, unspecified: Secondary | ICD-10-CM | POA: Diagnosis present

## 2014-07-26 DIAGNOSIS — R630 Anorexia: Secondary | ICD-10-CM | POA: Diagnosis not present

## 2014-07-26 HISTORY — PX: GASTROSTOMY: SHX5249

## 2014-07-26 HISTORY — DX: Other artificial openings of gastrointestinal tract status: Z93.4

## 2014-07-26 LAB — CBC
HEMATOCRIT: 37.5 % (ref 36.0–46.0)
HEMOGLOBIN: 11.6 g/dL — AB (ref 12.0–15.0)
MCH: 29.7 pg (ref 26.0–34.0)
MCHC: 30.9 g/dL (ref 30.0–36.0)
MCV: 96.2 fL (ref 78.0–100.0)
Platelets: 257 10*3/uL (ref 150–400)
RBC: 3.9 MIL/uL (ref 3.87–5.11)
RDW: 17.6 % — ABNORMAL HIGH (ref 11.5–15.5)
WBC: 15 10*3/uL — ABNORMAL HIGH (ref 4.0–10.5)

## 2014-07-26 LAB — CREATININE, SERUM
Creatinine, Ser: 0.94 mg/dL (ref 0.50–1.10)
GFR calc Af Amer: 74 mL/min — ABNORMAL LOW (ref >90)
GFR, EST NON AFRICAN AMERICAN: 64 mL/min — AB (ref >90)

## 2014-07-26 SURGERY — INSERTION OF GASTROSTOMY TUBE
Anesthesia: General | Site: Abdomen

## 2014-07-26 MED ORDER — FENTANYL CITRATE 0.05 MG/ML IJ SOLN
INTRAMUSCULAR | Status: AC
  Filled 2014-07-26: qty 5

## 2014-07-26 MED ORDER — LACTATED RINGERS IR SOLN
Status: DC | PRN
  Administered 2014-07-26: 1000 mL

## 2014-07-26 MED ORDER — GLYCOPYRROLATE 0.2 MG/ML IJ SOLN
INTRAMUSCULAR | Status: DC | PRN
  Administered 2014-07-26: 0.4 mg via INTRAVENOUS

## 2014-07-26 MED ORDER — LACTATED RINGERS IV SOLN
INTRAVENOUS | Status: DC
  Administered 2014-07-26: 14:00:00 via INTRAVENOUS
  Administered 2014-07-26: 1000 mL via INTRAVENOUS

## 2014-07-26 MED ORDER — LIP MEDEX EX OINT
TOPICAL_OINTMENT | CUTANEOUS | Status: AC
  Administered 2014-07-26: 16:00:00
  Filled 2014-07-26: qty 7

## 2014-07-26 MED ORDER — BUPIVACAINE-EPINEPHRINE (PF) 0.25% -1:200000 IJ SOLN
INTRAMUSCULAR | Status: AC
  Filled 2014-07-26: qty 30

## 2014-07-26 MED ORDER — BUPIVACAINE LIPOSOME 1.3 % IJ SUSP
20.0000 mL | Freq: Once | INTRAMUSCULAR | Status: AC
  Administered 2014-07-26: 20 mL
  Filled 2014-07-26: qty 20

## 2014-07-26 MED ORDER — NICOTINE 21 MG/24HR TD PT24
21.0000 mg | MEDICATED_PATCH | Freq: Every day | TRANSDERMAL | Status: DC
  Administered 2014-07-26 – 2014-07-27 (×2): 21 mg via TRANSDERMAL
  Filled 2014-07-26 (×3): qty 1

## 2014-07-26 MED ORDER — NEOSTIGMINE METHYLSULFATE 10 MG/10ML IV SOLN
INTRAVENOUS | Status: DC | PRN
  Administered 2014-07-26: 2 mg via INTRAVENOUS

## 2014-07-26 MED ORDER — SUCCINYLCHOLINE CHLORIDE 20 MG/ML IJ SOLN
INTRAMUSCULAR | Status: DC | PRN
  Administered 2014-07-26: 80 mg via INTRAVENOUS

## 2014-07-26 MED ORDER — ONDANSETRON HCL 4 MG PO TABS: 4.0000 mg | ORAL_TABLET | Freq: Four times a day (QID) | ORAL | Status: DC | PRN

## 2014-07-26 MED ORDER — LIDOCAINE HCL (CARDIAC) 20 MG/ML IV SOLN
INTRAVENOUS | Status: DC | PRN
  Administered 2014-07-26: 50 mg via INTRAVENOUS

## 2014-07-26 MED ORDER — PROMETHAZINE HCL 25 MG/ML IJ SOLN: 6.2500 mg | INTRAMUSCULAR | Status: DC | PRN

## 2014-07-26 MED ORDER — GLYCOPYRROLATE 0.2 MG/ML IJ SOLN
INTRAMUSCULAR | Status: AC
  Filled 2014-07-26: qty 2

## 2014-07-26 MED ORDER — DEXTROSE 5 % IV SOLN
INTRAVENOUS | Status: AC
  Filled 2014-07-26: qty 2

## 2014-07-26 MED ORDER — MIDAZOLAM HCL 2 MG/2ML IJ SOLN
INTRAMUSCULAR | Status: AC
  Filled 2014-07-26: qty 2

## 2014-07-26 MED ORDER — ROCURONIUM BROMIDE 100 MG/10ML IV SOLN
INTRAVENOUS | Status: DC | PRN
  Administered 2014-07-26: 5 mg via INTRAVENOUS
  Administered 2014-07-26: 15 mg via INTRAVENOUS
  Administered 2014-07-26: 5 mg via INTRAVENOUS

## 2014-07-26 MED ORDER — MIDAZOLAM HCL 5 MG/5ML IJ SOLN
INTRAMUSCULAR | Status: DC | PRN
  Administered 2014-07-26: 2 mg via INTRAVENOUS

## 2014-07-26 MED ORDER — DEXAMETHASONE SODIUM PHOSPHATE 10 MG/ML IJ SOLN
INTRAMUSCULAR | Status: AC
  Filled 2014-07-26: qty 1

## 2014-07-26 MED ORDER — FENTANYL CITRATE 0.05 MG/ML IJ SOLN
25.0000 ug | INTRAMUSCULAR | Status: DC | PRN
Start: 2014-07-26 — End: 2014-07-27
  Administered 2014-07-26 – 2014-07-27 (×6): 25 ug via INTRAVENOUS
  Filled 2014-07-26 (×6): qty 2

## 2014-07-26 MED ORDER — ONDANSETRON HCL 4 MG/2ML IJ SOLN
4.0000 mg | Freq: Four times a day (QID) | INTRAMUSCULAR | Status: DC | PRN
  Administered 2014-07-27: 4 mg via INTRAVENOUS
  Filled 2014-07-26: qty 2

## 2014-07-26 MED ORDER — DEXTROSE 5 % IV SOLN
1.0000 g | Freq: Four times a day (QID) | INTRAVENOUS | Status: AC
  Administered 2014-07-26: 1 g via INTRAVENOUS
  Filled 2014-07-26: qty 1

## 2014-07-26 MED ORDER — DEXTROSE 5 % IV SOLN
2.0000 g | INTRAVENOUS | Status: AC
  Administered 2014-07-26: 2 g via INTRAVENOUS

## 2014-07-26 MED ORDER — 0.9 % SODIUM CHLORIDE (POUR BTL) OPTIME
TOPICAL | Status: DC | PRN
  Administered 2014-07-26: 1000 mL

## 2014-07-26 MED ORDER — SODIUM CHLORIDE 0.9 % IJ SOLN
INTRAMUSCULAR | Status: AC
  Filled 2014-07-26: qty 10

## 2014-07-26 MED ORDER — MEPERIDINE HCL 50 MG/ML IJ SOLN: 6.2500 mg | INTRAMUSCULAR | Status: DC | PRN

## 2014-07-26 MED ORDER — ONDANSETRON HCL 4 MG/2ML IJ SOLN
INTRAMUSCULAR | Status: AC
  Filled 2014-07-26: qty 2

## 2014-07-26 MED ORDER — LIDOCAINE HCL (CARDIAC) 20 MG/ML IV SOLN
INTRAVENOUS | Status: AC
  Filled 2014-07-26: qty 5

## 2014-07-26 MED ORDER — PROPOFOL 10 MG/ML IV BOLUS
INTRAVENOUS | Status: DC | PRN
  Administered 2014-07-26: 20 mg via INTRAVENOUS
  Administered 2014-07-26: 100 mg via INTRAVENOUS

## 2014-07-26 MED ORDER — ONDANSETRON HCL 4 MG/2ML IJ SOLN
INTRAMUSCULAR | Status: DC | PRN
  Administered 2014-07-26: 4 mg via INTRAVENOUS

## 2014-07-26 MED ORDER — HEPARIN SODIUM (PORCINE) 5000 UNIT/ML IJ SOLN
5000.0000 [IU] | Freq: Once | INTRAMUSCULAR | Status: AC
  Administered 2014-07-26: 5000 [IU] via SUBCUTANEOUS
  Filled 2014-07-26: qty 1

## 2014-07-26 MED ORDER — NEOSTIGMINE METHYLSULFATE 10 MG/10ML IV SOLN
INTRAVENOUS | Status: AC
  Filled 2014-07-26: qty 1

## 2014-07-26 MED ORDER — PROPOFOL 10 MG/ML IV BOLUS
INTRAVENOUS | Status: AC
  Filled 2014-07-26: qty 20

## 2014-07-26 MED ORDER — ROCURONIUM BROMIDE 100 MG/10ML IV SOLN
INTRAVENOUS | Status: AC
  Filled 2014-07-26: qty 1

## 2014-07-26 MED ORDER — EPHEDRINE SULFATE 50 MG/ML IJ SOLN
INTRAMUSCULAR | Status: DC | PRN
  Administered 2014-07-26: 5 mg via INTRAVENOUS
  Administered 2014-07-26: 10 mg via INTRAVENOUS
  Administered 2014-07-26: 5 mg via INTRAVENOUS

## 2014-07-26 MED ORDER — FENTANYL CITRATE 0.05 MG/ML IJ SOLN
INTRAMUSCULAR | Status: DC | PRN
  Administered 2014-07-26 (×5): 50 ug via INTRAVENOUS

## 2014-07-26 MED ORDER — FENTANYL CITRATE 0.05 MG/ML IJ SOLN: 25.0000 ug | INTRAMUSCULAR | Status: DC | PRN

## 2014-07-26 MED ORDER — DEXAMETHASONE SODIUM PHOSPHATE 10 MG/ML IJ SOLN
INTRAMUSCULAR | Status: DC | PRN
  Administered 2014-07-26: 5 mg via INTRAVENOUS

## 2014-07-26 MED ORDER — HEPARIN SODIUM (PORCINE) 5000 UNIT/ML IJ SOLN
5000.0000 [IU] | Freq: Three times a day (TID) | INTRAMUSCULAR | Status: DC
  Administered 2014-07-26 – 2014-07-28 (×5): 5000 [IU] via SUBCUTANEOUS
  Filled 2014-07-26 (×9): qty 1

## 2014-07-26 MED ORDER — KCL IN DEXTROSE-NACL 20-5-0.45 MEQ/L-%-% IV SOLN
INTRAVENOUS | Status: DC
  Administered 2014-07-26: 15:00:00 via INTRAVENOUS
  Administered 2014-07-27: 1000 mL via INTRAVENOUS
  Administered 2014-07-27: 20:00:00 via INTRAVENOUS
  Filled 2014-07-26 (×4): qty 1000

## 2014-07-26 SURGICAL SUPPLY — 52 items
APPLICATOR COTTON TIP 6IN STRL (MISCELLANEOUS) IMPLANT
BLADE EXTENDED COATED 6.5IN (ELECTRODE) IMPLANT
BLADE HEX COATED 2.75 (ELECTRODE) ×3 IMPLANT
BLADE SURG 15 STRL LF DISP TIS (BLADE) ×1 IMPLANT
BLADE SURG 15 STRL SS (BLADE) ×2
CATH ROBINSON RED A/P 22FR (CATHETERS) ×3 IMPLANT
COVER MAYO STAND STRL (DRAPES) IMPLANT
DEVICE TROCAR PUNCTURE CLOSURE (ENDOMECHANICALS) ×3 IMPLANT
DRAIN CHANNEL 19F RND (DRAIN) IMPLANT
DRAPE LAPAROSCOPIC ABDOMINAL (DRAPES) IMPLANT
DRAPE WARM FLUID 44X44 (DRAPE) IMPLANT
DRSG PAD ABDOMINAL 8X10 ST (GAUZE/BANDAGES/DRESSINGS) ×3 IMPLANT
ELECT REM PT RETURN 9FT ADLT (ELECTROSURGICAL) ×3
ELECTRODE REM PT RTRN 9FT ADLT (ELECTROSURGICAL) ×1 IMPLANT
EVACUATOR SILICONE 100CC (DRAIN) IMPLANT
GAUZE SPONGE 4X4 12PLY STRL (GAUZE/BANDAGES/DRESSINGS) ×3 IMPLANT
GAUZE SPONGE 4X4 16PLY XRAY LF (GAUZE/BANDAGES/DRESSINGS) ×3 IMPLANT
GLOVE BIOGEL M 8.0 STRL (GLOVE) ×3 IMPLANT
GOWN SPEC L4 XLG W/TWL (GOWN DISPOSABLE) ×3 IMPLANT
GOWN STRL REUS W/TWL XL LVL3 (GOWN DISPOSABLE) ×9 IMPLANT
KIT BASIN OR (CUSTOM PROCEDURE TRAY) ×3 IMPLANT
LIGASURE IMPACT 36 18CM CVD LR (INSTRUMENTS) IMPLANT
LIQUID BAND (GAUZE/BANDAGES/DRESSINGS) ×3 IMPLANT
NS IRRIG 1000ML POUR BTL (IV SOLUTION) ×3 IMPLANT
PACK GENERAL/GYN (CUSTOM PROCEDURE TRAY) ×3 IMPLANT
PLUG CATH AND CAP STER (CATHETERS) ×3 IMPLANT
SHEATH PEELAWAY 24FR (VASCULAR PRODUCTS) ×3 IMPLANT
SLEEVE XCEL OPT CAN 5 100 (ENDOMECHANICALS) ×9 IMPLANT
SPONGE DRAIN TRACH 4X4 STRL 2S (GAUZE/BANDAGES/DRESSINGS) ×3 IMPLANT
SPONGE LAP 18X18 X RAY DECT (DISPOSABLE) ×6 IMPLANT
STAPLER VISISTAT 35W (STAPLE) ×6 IMPLANT
SUCTION POOLE TIP (SUCTIONS) IMPLANT
SUT ETHILON 2 0 PS N (SUTURE) ×3 IMPLANT
SUT NOVA NAB DX-16 0-1 5-0 T12 (SUTURE) ×3 IMPLANT
SUT PDS AB 1 CTX 36 (SUTURE) IMPLANT
SUT SILK 2 0 (SUTURE) ×2
SUT SILK 2 0 SH CR/8 (SUTURE) ×3 IMPLANT
SUT SILK 2-0 18XBRD TIE 12 (SUTURE) ×1 IMPLANT
SUT SILK 3 0 (SUTURE) ×2
SUT SILK 3 0 SH CR/8 (SUTURE) ×3 IMPLANT
SUT SILK 3-0 18XBRD TIE 12 (SUTURE) ×1 IMPLANT
SUT VIC AB 2-0 SH 27 (SUTURE) ×2
SUT VIC AB 2-0 SH 27X BRD (SUTURE) ×1 IMPLANT
SUT VIC AB 3-0 SH 18 (SUTURE) ×3 IMPLANT
SUT VICRYL 2 0 18  UND BR (SUTURE)
SUT VICRYL 2 0 18 UND BR (SUTURE) IMPLANT
SUT VICRYL 4-0 (SUTURE) ×3 IMPLANT
SYR BULB IRRIGATION 50ML (SYRINGE) ×3 IMPLANT
TOWEL OR 17X26 10 PK STRL BLUE (TOWEL DISPOSABLE) ×3 IMPLANT
TOWEL OR NON WOVEN STRL DISP B (DISPOSABLE) ×3 IMPLANT
TRAY FOLEY CATH 14FRSI W/METER (CATHETERS) ×3 IMPLANT
YANKAUER SUCT BULB TIP NO VENT (SUCTIONS) ×3 IMPLANT

## 2014-07-26 NOTE — Transfer of Care (Signed)
Immediate Anesthesia Transfer of Care Note  Patient: [name]  Procedure(s) Performed: Procedure(s) (LRB): LAPRASCOPIC ASSISTED OPEN PLACEMENT OF JEJUNOSTOMY TUBE (N/A)  Patient Location: PACU  Anesthesia Type: General  Level of Consciousness: sedated, patient cooperative and responds to stimulation  Airway & Oxygen Therapy: Patient Spontanous Breathing and Patient connected to face mask oxgen  Post-op Assessment: Report given to PACU RN and Post -op Vital signs reviewed and stable  Post vital signs: Reviewed and stable  Complications: No apparent anesthesia complications

## 2014-07-26 NOTE — Op Note (Signed)
Surgeon: Kaylyn Lim, MD, FACS  Asst:  none  Anes:  general  Procedure: Laparoscopy, open placement of 22 French red Robinson catheter  Diagnosis: Inanition, malnutrition  Complications: none  EBL:   50 cc  Drains: none  Description of Procedure:  The patient was taken to OR 1 at [facility].  After anesthesia was administered and the patient was prepped a timeout was performed.  Access to the abdomen was achieved with a 5 mm Optiview through the right upper quadrant.  Liver noted to have a bleeder at insertion site and this was cauterized.  Two more 5 mm trocars were placed and the prior jejunostomy was visualized and I attempted to insert a guidewire into the jejunum that had be previously tacked in place.  The wire preferentially went upstream so I opened her midline incision and took down the old jejunostomy.  Using a 24 Seldinger dilatore and peel away I inserted a 23 red robinson with extra holes and tied around the insertion site with a 2-0 vicryl.  A lateral 2-0 vicryl was place and pulled out and tied with the Endoclose.  The loop was tacked toward the midline.  The midline incision was closed with interrupted 0 Novafil.  All incisions were infiltrated with Exparel and closed with 4-0 vicryl.  The midline was closed with staples and the laparoscopic ports were closed with LiquiBan.    The patient tolerated the procedure well and was taken to the PACU in stable condition.     [name], [place], [facility], [place]

## 2014-07-26 NOTE — Anesthesia Preprocedure Evaluation (Addendum)
Anesthesia Evaluation  Patient identified by MRN, date of birth, ID band Patient awake    Reviewed: Allergy & Precautions, NPO status , Patient's Chart, lab work & pertinent test results  Airway        Dental   Pulmonary shortness of breath, asthma , pneumonia -, COPDCurrent Smoker,          Cardiovascular negative cardio ROS      Neuro/Psych  Headaches, PSYCHIATRIC DISORDERS Anxiety Depression negative neurological ROS     GI/Hepatic Neg liver ROS, hiatal hernia, PUD, GERD-  Medicated,  Endo/Other  negative endocrine ROS  Renal/GU negative Renal ROS     Musculoskeletal negative musculoskeletal ROS (+) Arthritis -,   Abdominal   Peds  Hematology negative hematology ROS (+) anemia ,   Anesthesia Other Findings   Reproductive/Obstetrics negative OB ROS                            Anesthesia Physical Anesthesia Plan  ASA: III  Anesthesia Plan: General   Post-op Pain Management:    Induction: Intravenous  Airway Management Planned: Oral ETT  Additional Equipment: None  Intra-op Plan:   Post-operative Plan: Extubation in OR  Informed Consent: I have reviewed the patients History and Physical, chart, labs and discussed the procedure including the risks, benefits and alternatives for the proposed anesthesia with the patient or authorized representative who has indicated his/her understanding and acceptance.   Dental advisory given  Plan Discussed with: CRNA  Anesthesia Plan Comments:         Anesthesia Quick Evaluation                                  Anesthesia Evaluation  Patient identified by MRN, date of birth, ID band Patient awake    Reviewed: Allergy & Precautions, H&P , NPO status , Patient's Chart, lab work & pertinent test results  Airway Mallampati: II TM Distance: >3 FB Neck ROM: full    Dental  (+) Edentulous Upper and Edentulous Lower    Pulmonary asthma , COPDCurrent Smoker,  breath sounds clear to auscultation  Pulmonary exam normal       Cardiovascular Exercise Tolerance: Good negative cardio ROS  Rhythm:regular Rate:Normal     Neuro/Psych Anxiety Depression negative neurological ROS  negative psych ROS   GI/Hepatic negative GI ROS, Neg liver ROS, GERD-  Medicated and Controlled,  Endo/Other  negative endocrine ROS  Renal/GU negative Renal ROS  negative genitourinary   Musculoskeletal   Abdominal   Peds  Hematology negative hematology ROS (+)   Anesthesia Other Findings   Reproductive/Obstetrics negative OB ROS                          Anesthesia Physical Anesthesia Plan  ASA: III  Anesthesia Plan: General   Post-op Pain Management:    Induction: Intravenous  Airway Management Planned: Oral ETT  Additional Equipment:   Intra-op Plan:   Post-operative Plan: Extubation in OR  Informed Consent: I have reviewed the patients History and Physical, chart, labs and discussed the procedure including the risks, benefits and alternatives for the proposed anesthesia with the patient or authorized representative who has indicated his/her understanding and acceptance.   Dental Advisory Given  Plan Discussed with: CRNA and Surgeon  Anesthesia Plan Comments:         Anesthesia Quick Evaluation

## 2014-07-26 NOTE — Interval H&P Note (Signed)
History and Physical Interval Note:  07/26/2014 10:54 AM  [name]  has presented today for surgery, with the diagnosis of ANOREXIA, MALNUTRITION  The various methods of treatment have been discussed with the patient and family. After consideration of risks, benefits and other options for treatment, the patient has consented to  Procedure(s): PLACEMENT OF JEJUNOSTOMY TUBE (N/A) as a surgical intervention .  The patient's history has been reviewed, patient examined, no change in status, stable for surgery.  I have reviewed the patient's chart and labs.  Questions were answered to the patient's satisfaction.     Katina Remick B

## 2014-07-26 NOTE — Anesthesia Postprocedure Evaluation (Signed)
Anesthesia Post Note  Patient: [name]  Procedure(s) Performed: Procedure(s) (LRB): LAPRASCOPIC ASSISTED OPEN PLACEMENT OF JEJUNOSTOMY TUBE (N/A)  Anesthesia type: General  Patient location: PACU  Post pain: Pain level controlled  Post assessment: Post-op Vital signs reviewed  Last Vitals: BP 105/64 mmHg  Pulse 77  Temp(Src) 36.8 C (Oral)  Resp 18  Ht 5\' 3"  (1.6 m)  Wt 93 lb (42.185 kg)  BMI 16.48 kg/m2  SpO2 94%  Post vital signs: Reviewed  Level of consciousness: sedated  Complications: No apparent anesthesia complications

## 2014-07-26 NOTE — H&P (Signed)
Chief Complaint:  Malnutrition and inadequate intake  History of Present Illness:  [name] is an 64 y.o. female who is afraid that she is going to die because she can't gain weight.  She has been admitted and attempts to improve her PO intake have been unsuccessful.  She wants a feeding jejunostomy.    Past Medical History  Diagnosis Date  . Peptic ulcer disease     dr Oletta Lamas  . Gastric outlet obstruction   . Acute duodenal ulcer with hemorrhage and perforation, with obstruction   . Barrett's esophagus   . Menopause   . GERD (gastroesophageal reflux disease)   . Asthma   . Chronic abdominal pain     narcotic dependence, dr gyarteng-dak at [facility]  . Narcotic abuse   . Anxiety     sees [name] NP at Dr. Radonna Ricker office  . Fx two ribs-open 08-24-11    left 4th and 5th  . Fx of fibula 07-28-11    left fibula, 2 places   . COPD (chronic obstructive pulmonary disease)   . S/P ORIF (open reduction internal fixation) fracture     both hips pinned  . Allergy   . Anemia   . History of blood transfusion   . Lap Nissen + truncal vagotomy July 2008 05/13/2013  . LEG EDEMA 01/19/2008    Qualifier: Diagnosis of  By: [name] MD, [name]   . ACUTE DUODEN ULCER W/HEMORR PERF&OBSTRUCTION 06/26/2004    Qualifier: Diagnosis of  By: [name] MD, [name]   . FEVER, RECURRENT 08/11/2009    Qualifier: Diagnosis of  By: [name] MD, [name] MENOPAUSE 01/07/2007    Qualifier: Diagnosis of  By: [name], CMA, [place]    . Pneumonia, organism unspecified 11/07/2010    Assoc with R Parapneumonic effusion 09/2010    - Tapped 10/05/10    - CxR resolved 11/07/2010    . Gastroparesis 06/03/2007    Qualifier: Diagnosis of  By: [name] MD, [name]   . GASTRIC OUTLET OBSTRUCTION 08/28/2007    In July 2008 she underwent laparoscopic enterolysis, Nissen fundoplication over a #10 bougie, single pledgeted suture colosure of the hiatus and a 3 suture wrap.  Lap truncal vagotomy and a loop gastrojejunostomy was  performed.  This was revised in August of 2009 to a roux en Y gastrojejujostomy.     . 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 Complication of anesthesia     pt states had too much xanax on board - agitated   . Shortness of breath dyspnea     with exertion   . Depression   . History of hiatal hernia   . Headache     freq headaches   . Arthritis     Past Surgical History  Procedure Laterality Date  . Esophagogastroduodenoscopy  12-29-09    dr qadeer at [facility], several gastric ulcers  . Vagotomy      lapaproscopic - nissan  . 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 Hernia repair    . Perforated ulcer    . Biopsy thyroid  08-13-11    benign nodule, per Dr. Melida Quitter   . Esophagogastroduodenoscopy N/A 09/25/2012    Procedure: ESOPHAGOGASTRODUODENOSCOPY (EGD);  Surgeon: Beryle Beams, MD;  Location: [facility];  Service: Endoscopy;  Laterality: N/A;  . Fracture surgery Bilateral     hips with screw  . Tonsillectomy    .  Eye surgery Bilateral     cataract extraction with IOL  . Laparoscopic partial gastrectomy N/A 06/29/2013    Procedure: Gastrectomy and GJ Tube placement;  Surgeon: Pedro Earls, MD;  Location: [facility];  Service: General;  Laterality: N/A;  . Colonscopy       Current Facility-Administered Medications  Medication Dose Route Frequency Provider Last Rate Last Dose  . cefOXitin (MEFOXIN) 2 g in dextrose 5 % 50 mL IVPB  2 g Intravenous On Call to OR Pedro Earls, MD      . lactated ringers infusion   Intravenous Continuous Nickie Retort, MD 125 mL/hr at 07/26/14 1041 1,000 mL at 07/26/14 1041   Celebrex; Morphine; Quetiapine; and Varenicline tartrate Family History  Problem Relation Age of Onset  . Cancer Mother     kidney, magliant tumor on face  . Stroke Mother   . Celiac disease Father   . Cancer Father     kidney and pancreatic   Social History:   reports that she has been smoking Cigarettes.  She has a 90  pack-year smoking history. She has never used smokeless tobacco. She reports that she does not drink alcohol or use illicit drugs.   REVIEW OF SYSTEMS :  Physical Exam:   Blood pressure 107/57, pulse 65, temperature 98.4 F (36.9 C), temperature source Oral, resp. rate 18, height 5\' 3"  (1.6 m), weight 93 lb (42.185 kg), SpO2 100 %. Body mass index is 16.48 kg/(m^2).  Gen:  Normally  Developed malnourished WF NAD  Neurological: Alert and oriented to person, place, and time. Motor and sensory function is grossly intact  Head: Normocephalic and atraumatic.  Eyes: Conjunctivae are normal. Pupils are equal, round, and reactive to light. No scleral icterus.  Neck: Normal range of motion. Neck supple. No tracheal deviation or thyromegaly present.  Cardiovascular:  SR without murmurs or gallops.  No carotid bruits Breast:  Not examined Respiratory: Effort normal.  No respiratory distress. No chest wall tenderness. Breath sounds normal.  No wheezes, rales or rhonchi.  Abdomen:  Tan scars from heating pads.  Prior jejunostomy in the left upper quadrant GU:  Not examined Musculoskeletal: Normal range of motion. Extremities are nontender. No cyanosis, edema or clubbing noted Lymphadenopathy: No cervical, preauricular, postauricular or axillary adenopathy is present Skin: Skin is warm and dry. No rash noted. No diaphoresis. No erythema. No pallor. Pscyh: Normal mood and affect. Behavior is normal. Judgment and thought content normal.   LABORATORY RESULTS: No results found for this or any previous visit (from the past 48 hour(s)).   RADIOLOGY RESULTS: No results found.  Problem List: Patient Active Problem List   Diagnosis Date Noted  . Severe protein-calorie malnutrition 12/30/2013  . Constipation, chronic 07/10/2013  . Chronic abdominal pain   . COPD (chronic obstructive pulmonary disease) 11/07/2010  . Major depressive disorder, recurrent episode, moderate 06/06/2010  . LUNG NODULE  03/15/2010  . 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 CIGARETTE SMOKER 08/24/2007  . Anxiety state 01/07/2007  . BARRETT'S ESOPHAGUS, HX OF 07/03/2005    Assessment & Plan: Malnutrition for jejunostomy placement    [name], MD, [facility] [phone] beeper [phone]  07/26/2014 10:50 AM

## 2014-07-26 NOTE — Anesthesia Procedure Notes (Signed)
Procedure Name: Intubation Date/Time: 07/26/2014 11:52 AM Performed by: Montel Clock Pre-anesthesia Checklist: Patient identified, Emergency Drugs available, Suction available, Patient being monitored and Timeout performed Patient Re-evaluated:Patient Re-evaluated prior to inductionOxygen Delivery Method: Circle system utilized Preoxygenation: Pre-oxygenation with 100% oxygen Intubation Type: IV induction Ventilation: Mask ventilation without difficulty Laryngoscope Size: 3 and Glidescope Grade View: Grade I Tube type: Oral Tube size: 7.0 mm Number of attempts: 1 Airway Equipment and Method: Stylet Placement Confirmation: ETT inserted through vocal cords under direct vision,  positive ETCO2 and breath sounds checked- equal and bilateral Secured at: 21 cm Tube secured with: Tape Dental Injury: Teeth and Oropharynx as per pre-operative assessment  Comments: Elective glidescope.

## 2014-07-27 ENCOUNTER — Encounter (HOSPITAL_COMMUNITY): Payer: Self-pay | Admitting: Surgery

## 2014-07-27 LAB — BASIC METABOLIC PANEL
ANION GAP: 3 — AB (ref 5–15)
BUN: 8 mg/dL (ref 6–23)
CALCIUM: 8.2 mg/dL — AB (ref 8.4–10.5)
CO2: 25 mmol/L (ref 19–32)
Chloride: 109 mmol/L (ref 96–112)
Creatinine, Ser: 0.62 mg/dL (ref 0.50–1.10)
Glucose, Bld: 140 mg/dL — ABNORMAL HIGH (ref 70–99)
Potassium: 4.4 mmol/L (ref 3.5–5.1)
Sodium: 137 mmol/L (ref 135–145)

## 2014-07-27 LAB — CBC
HCT: 31.8 % — ABNORMAL LOW (ref 36.0–46.0)
Hemoglobin: 10 g/dL — ABNORMAL LOW (ref 12.0–15.0)
MCH: 30 pg (ref 26.0–34.0)
MCHC: 31.4 g/dL (ref 30.0–36.0)
MCV: 95.5 fL (ref 78.0–100.0)
Platelets: 251 10*3/uL (ref 150–400)
RBC: 3.33 MIL/uL — ABNORMAL LOW (ref 3.87–5.11)
RDW: 17.2 % — ABNORMAL HIGH (ref 11.5–15.5)
WBC: 12.4 10*3/uL — ABNORMAL HIGH (ref 4.0–10.5)

## 2014-07-27 MED ORDER — ALPRAZOLAM 0.5 MG PO TABS
0.5000 mg | ORAL_TABLET | Freq: Three times a day (TID) | ORAL | Status: DC | PRN
  Administered 2014-07-27 (×2): 0.5 mg via ORAL
  Filled 2014-07-27 (×2): qty 1

## 2014-07-27 MED ORDER — RISPERIDONE 0.5 MG PO TABS
0.5000 mg | ORAL_TABLET | Freq: Every day | ORAL | Status: DC
  Administered 2014-07-27: 0.5 mg via ORAL
  Filled 2014-07-27 (×2): qty 1

## 2014-07-27 MED ORDER — ENSURE COMPLETE PO LIQD: 237.0000 mL | Freq: Three times a day (TID) | ORAL | Status: DC

## 2014-07-27 MED ORDER — HYDROCODONE-ACETAMINOPHEN 5-325 MG PO TABS
1.0000 | ORAL_TABLET | Freq: Four times a day (QID) | ORAL | Status: DC | PRN
  Administered 2014-07-27 (×2): 1 via ORAL
  Filled 2014-07-27 (×2): qty 1

## 2014-07-27 MED ORDER — RISPERIDONE 0.25 MG PO TABS
0.2500 mg | ORAL_TABLET | Freq: Two times a day (BID) | ORAL | Status: DC
  Administered 2014-07-27 – 2014-07-28 (×3): 0.25 mg via ORAL
  Filled 2014-07-27 (×4): qty 1

## 2014-07-27 MED ORDER — HYDROMORPHONE HCL 1 MG/ML IJ SOLN
1.0000 mg | INTRAMUSCULAR | Status: DC | PRN
  Administered 2014-07-27 – 2014-07-28 (×18): 1 mg via INTRAVENOUS
  Filled 2014-07-27 (×18): qty 1

## 2014-07-27 NOTE — Progress Notes (Signed)
Patient instructed that we are a tobacco free facility as well as e cigarettes.  Patient states she will not smoke her e cigarettes and placed the e cigarettes in her luggage and placed in closet.

## 2014-07-27 NOTE — Progress Notes (Signed)
Patient ID: [name], female   DOB: 08-21-1950, 64 y.o.   MRN: [id number] [facility] Progress Note:   1 Day Post-Op  Subjective: Mental status is clear.  Taking full liquids.  Wants solids Objective: Vital signs in last 24 hours: Temp:  [97.4 F (36.3 C)-99.3 F (37.4 C)] 99.3 F (37.4 C) (02/03 0442) Pulse Rate:  [61-98] 61 (02/03 0442) Resp:  [16-20] 16 (02/03 0442) BP: (102-155)/(49-80) 102/50 mmHg (02/03 0442) SpO2:  [93 %-100 %] 93 % (02/03 0442)  Intake/Output from previous day: 02/02 0701 - 02/03 0700 In: 3185 [P.O.:960; I.V.:2225] Out: 2450 [Urine:2450] Intake/Output this shift: Total I/O In: 780 [P.O.:480; I.V.:300] Out: 450 [Urine:450]  Physical Exam: Work of breathing is normal.  Incision OK.    Lab Results:  Results for orders placed or performed during the hospital encounter of 07/26/14 (from the past 48 hour(s))  CBC     Status: Abnormal   Collection Time: 07/26/14  3:00 PM  Result Value Ref Range   WBC 15.0 (H) 4.0 - 10.5 K/uL   RBC 3.90 3.87 - 5.11 MIL/uL   Hemoglobin 11.6 (L) 12.0 - 15.0 g/dL   HCT 37.5 36.0 - 46.0 %   MCV 96.2 78.0 - 100.0 fL   MCH 29.7 26.0 - 34.0 pg   MCHC 30.9 30.0 - 36.0 g/dL   RDW 17.6 (H) 11.5 - 15.5 %   Platelets 257 150 - 400 K/uL  Creatinine, serum     Status: Abnormal   Collection Time: 07/26/14  3:00 PM  Result Value Ref Range   Creatinine, Ser 0.94 0.50 - 1.10 mg/dL   GFR calc non Af Amer 64 (L) >90 mL/min   GFR calc Af Amer 74 (L) >90 mL/min    Comment: (NOTE) The eGFR has been calculated using the CKD EPI equation. This calculation has not been validated in all clinical situations. eGFR's persistently <90 mL/min signify possible Chronic Kidney Disease.   CBC     Status: Abnormal   Collection Time: 07/27/14  4:47 AM  Result Value Ref Range   WBC 12.4 (H) 4.0 - 10.5 K/uL   RBC 3.33 (L) 3.87 - 5.11 MIL/uL   Hemoglobin 10.0 (L) 12.0 - 15.0 g/dL   HCT 31.8 (L) 36.0 - 46.0 %   MCV 95.5 78.0 - 100.0  fL   MCH 30.0 26.0 - 34.0 pg   MCHC 31.4 30.0 - 36.0 g/dL   RDW 17.2 (H) 11.5 - 15.5 %   Platelets 251 150 - 400 K/uL  Basic metabolic panel     Status: Abnormal   Collection Time: 07/27/14  4:47 AM  Result Value Ref Range   Sodium 137 135 - 145 mmol/L   Potassium 4.4 3.5 - 5.1 mmol/L   Chloride 109 96 - 112 mmol/L   CO2 25 19 - 32 mmol/L   Glucose, Bld 140 (H) 70 - 99 mg/dL   BUN 8 6 - 23 mg/dL   Creatinine, Ser 0.62 0.50 - 1.10 mg/dL    Comment: DELTA CHECK NOTED   Calcium 8.2 (L) 8.4 - 10.5 mg/dL   GFR calc non Af Amer >90 >90 mL/min   GFR calc Af Amer >90 >90 mL/min    Comment: (NOTE) The eGFR has been calculated using the CKD EPI equation. This calculation has not been validated in all clinical situations. eGFR's persistently <90 mL/min signify possible Chronic Kidney Disease.    Anion gap 3 (L) 5 - 15    Radiology/Results: No  results found.  Anti-infectives: Anti-infectives    Start     Dose/Rate Route Frequency Ordered Stop   07/26/14 1500  cefOXitin (MEFOXIN) 1 g in dextrose 5 % 50 mL IVPB     1 g100 mL/hr over 30 Minutes Intravenous 4 times per day 07/26/14 1428 07/26/14 1530   07/26/14 0930  cefOXitin (MEFOXIN) 2 g in dextrose 5 % 50 mL IVPB     2 g100 mL/hr over 30 Minutes Intravenous On call to O.R. 07/26/14 0929 07/26/14 1155      Assessment/Plan: Problem List: Patient Active Problem List   Diagnosis Date Noted  . S/P jejunostomy Feb 2016 07/26/2014  . Severe protein-calorie malnutrition 12/30/2013  . Constipation, chronic 07/10/2013  . Chronic abdominal pain   . COPD (chronic obstructive pulmonary disease) 11/07/2010  . Major depressive disorder, recurrent episode, moderate 06/06/2010  . LUNG NODULE 03/15/2010  . 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 CIGARETTE SMOKER 08/24/2007  . Anxiety state 01/07/2007  . BARRETT'S ESOPHAGUS, HX OF 07/03/2005    Will advance to solids.  Hopeful discharge tomorrow.  1 Day Post-Op    LOS: 1 day   Matt B.  Hassell Done, MD, [facility] [phone] beeper [phone]  07/27/2014 1:47 PM

## 2014-07-27 NOTE — Progress Notes (Signed)
INITIAL NUTRITION ASSESSMENT  DOCUMENTATION CODES Per approved criteria  -Severe malnutrition in the context of chronic illness  Pt meets criteria for severe MALNUTRITION in the context of chronic illness as evidenced by moderate to severe fat and muscle wasting.  INTERVENTION: - Encourage PO intake. - Ensure Complete po TID, each supplement provides 350 kcal and 13 grams of protein - Diet advancement to regular diet.  - RD will continue to monitor.   NUTRITION DIAGNOSIS: Inadequate oral intake related to chronic illness as evidenced by poor po.   Goal: Pt to meet >/= 90% of their estimated nutrition needs   Monitor:  Weight trend, po intake, acceptance of supplements, labs  Reason for Assessment: Low BMI  64 y.o. female  Admitting Dx: <principal problem not specified>  ASSESSMENT: 64 y.o. female who is afraid that she is going to die because she can't gain weight. She has been admitted and attempts to improve her PO intake have been unsuccessful. She wants a feeding jejunostomy.  - Pt s/p jejunostomy 2/2. She has a history of J-tube use. Pt reports that she was on an Osmolite formula 3-4 cans per day. - Spoke with surgeon who does not want to start TF at this time. Will continue to encourage PO.  - Pt eating 100% of full liquid diet. Diet upgraded to Regular.  - Pt with no recent weight loss. She reports that she normally has a good appetite at home and that her po is good. Agreed to drink Ensure supplements.  - Pt seen by RD for calorie counts in the past. Pt eating 100-200% of estimated needs at that time. Concern as to whether pt eats this well at home?  - RD to follow for need to use J-tube.  Nutrition Focused Physical Exam:  Subcutaneous Fat:  Orbital Region: moderate wasting Upper Arm Region: moderate to severe wasting Thoracic and Lumbar Region: n/a  Muscle:  Temple Region: moderate wasting Clavicle Bone Region: moderate to severe wasting Clavicle and  Acromion Bone Region: moderate to severe wasting Scapular Bone Region: n/a Dorsal Hand: mild to moderate wasting Patellar Region: moderate wasting Anterior Thigh Region: moderate wasting Posterior Calf Region: moderate wasting  Edema: none  Height: Ht Readings from Last 1 Encounters:  07/26/14 5\' 3"  (1.6 m)    Weight: Wt Readings from Last 1 Encounters:  07/26/14 93 lb (42.185 kg)    Ideal Body Weight: 52.4 kg  % Ideal Body Weight: 81%  Wt Readings from Last 10 Encounters:  07/26/14 93 lb (42.185 kg)  07/22/14 93 lb (42.185 kg)  06/02/14 90 lb 6.4 oz (41.005 kg)  04/15/14 89 lb 5 oz (40.512 kg)  02/24/14 91 lb (41.277 kg)  02/10/14 91 lb 6.4 oz (41.459 kg)  02/06/14 90 lb 9.6 oz (41.096 kg)  01/20/14 91 lb (41.277 kg)  01/04/14 98 lb 2 oz (44.509 kg)  12/30/13 90 lb (40.824 kg)   BMI:  Body mass index is 16.48 kg/(m^2).  Estimated Nutritional Needs: Kcal: 1200-1400 Protein: 60-75 g Fluid: 1.4 L/day  Skin: incision on abdomen  Diet Order: Diet regular  EDUCATION NEEDS: -Education needs addressed   Intake/Output Summary (Last 24 hours) at 07/27/14 1414 Last data filed at 07/27/14 1400  Gross per 24 hour  Intake   3265 ml  Output   2900 ml  Net    365 ml    Last BM: prior to admission   Labs:   Recent Labs Lab 07/22/14 1120 07/26/14 1500 07/27/14 0447  NA 141  --  137  K 4.9  --  4.4  CL 108  --  109  CO2 26  --  25  BUN 13  --  8  CREATININE 0.68 0.94 0.62  CALCIUM 8.9  --  8.2*  GLUCOSE 90  --  140*    CBG (last 3)  No results for input(s): GLUCAP in the last 72 hours.  Scheduled Meds: . heparin  5,000 Units Subcutaneous 3 times per day  . nicotine  21 mg Transdermal Daily  . risperiDONE  0.25 mg Oral BID  . risperiDONE  0.5 mg Oral QHS    Continuous Infusions: . dextrose 5 % and 0.45 % NaCl with KCl 20 mEq/L 1,000 mL (07/27/14 0538)    Past Medical History  Diagnosis Date  . Peptic ulcer disease     dr Oletta Lamas  . Gastric  outlet obstruction   . Acute duodenal ulcer with hemorrhage and perforation, with obstruction   . Barrett's esophagus   . Menopause   . GERD (gastroesophageal reflux disease)   . Asthma   . Chronic abdominal pain     narcotic dependence, dr gyarteng-dak at [facility]  . Narcotic abuse   . Anxiety     sees [name] NP at Dr. Radonna Ricker office  . Fx two ribs-open 08-24-11    left 4th and 5th  . Fx of fibula 07-28-11    left fibula, 2 places   . COPD (chronic obstructive pulmonary disease)   . S/P ORIF (open reduction internal fixation) fracture     both hips pinned  . Allergy   . Anemia   . History of blood transfusion   . Lap Nissen + truncal vagotomy July 2008 05/13/2013  . LEG EDEMA 01/19/2008    Qualifier: Diagnosis of  By: [name] MD, [name]   . ACUTE DUODEN ULCER W/HEMORR PERF&OBSTRUCTION 06/26/2004    Qualifier: Diagnosis of  By: [name] MD, [name]   . FEVER, RECURRENT 08/11/2009    Qualifier: Diagnosis of  By: [name] MD, [name] MENOPAUSE 01/07/2007    Qualifier: Diagnosis of  By: [name], CMA, [place]    . Pneumonia, organism unspecified 11/07/2010    Assoc with R Parapneumonic effusion 09/2010    - Tapped 10/05/10    - CxR resolved 11/07/2010    . Gastroparesis 06/03/2007    Qualifier: Diagnosis of  By: [name] MD, [name]   . GASTRIC OUTLET OBSTRUCTION 08/28/2007    In July 2008 she underwent laparoscopic enterolysis, Nissen fundoplication over a #40 bougie, single pledgeted suture colosure of the hiatus and a 3 suture wrap.  Lap truncal vagotomy and a loop gastrojejunostomy was performed.  This was revised in August of 2009 to a roux en Y gastrojejujostomy.     . 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 Complication of anesthesia     pt states had too much xanax on board - agitated   . Shortness of breath dyspnea     with exertion   . Depression   . History of hiatal hernia   . Headache     freq headaches   . Arthritis     Past Surgical  History  Procedure Laterality Date  . Esophagogastroduodenoscopy  12-29-09    dr qadeer at [facility], several gastric ulcers  . Vagotomy      lapaproscopic - nissan  . 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 Hernia repair    .  Perforated ulcer    . Biopsy thyroid  08-13-11    benign nodule, per Dr. Melida Quitter   . Esophagogastroduodenoscopy N/A 09/25/2012    Procedure: ESOPHAGOGASTRODUODENOSCOPY (EGD);  Surgeon: Beryle Beams, MD;  Location: [facility];  Service: Endoscopy;  Laterality: N/A;  . Fracture surgery Bilateral     hips with screw  . Tonsillectomy    . Eye surgery Bilateral     cataract extraction with IOL  . Laparoscopic partial gastrectomy N/A 06/29/2013    Procedure: Gastrectomy and GJ Tube placement;  Surgeon: Pedro Earls, MD;  Location: [facility];  Service: General;  Laterality: N/A;  . Colonscopy       Laurette Schimke MS, RD, LDN

## 2014-07-28 LAB — BASIC METABOLIC PANEL
Anion gap: 5 (ref 5–15)
BUN: 8 mg/dL (ref 6–23)
CO2: 27 mmol/L (ref 19–32)
CREATININE: 0.85 mg/dL (ref 0.50–1.10)
Calcium: 8.4 mg/dL (ref 8.4–10.5)
Chloride: 107 mmol/L (ref 96–112)
GFR calc Af Amer: 83 mL/min — ABNORMAL LOW (ref >90)
GFR calc non Af Amer: 72 mL/min — ABNORMAL LOW (ref >90)
GLUCOSE: 93 mg/dL (ref 70–99)
Potassium: 4.7 mmol/L (ref 3.5–5.1)
Sodium: 139 mmol/L (ref 135–145)

## 2014-07-28 LAB — CBC
HEMATOCRIT: 32 % — AB (ref 36.0–46.0)
Hemoglobin: 9.8 g/dL — ABNORMAL LOW (ref 12.0–15.0)
MCH: 29.4 pg (ref 26.0–34.0)
MCHC: 30.6 g/dL (ref 30.0–36.0)
MCV: 96.1 fL (ref 78.0–100.0)
Platelets: 218 10*3/uL (ref 150–400)
RBC: 3.33 MIL/uL — ABNORMAL LOW (ref 3.87–5.11)
RDW: 16.7 % — ABNORMAL HIGH (ref 11.5–15.5)
WBC: 8.9 10*3/uL (ref 4.0–10.5)

## 2014-07-28 MED ORDER — HYDROCODONE-ACETAMINOPHEN 5-325 MG PO TABS: 1.0000 | ORAL_TABLET | Freq: Four times a day (QID) | ORAL | Status: DC | PRN

## 2014-07-28 NOTE — Progress Notes (Signed)
Pt d/c'd to home with [facility]. Dietician gave instructions at bedside for tube feeds. [place] RN to demonstrate tube feeds. Pt refused dressing change around j-tube stating "I've had a tube before. I won't ever wear a dressing around it." Writer instructed pt to monitor for signs and symptoms of infection (redness, warmth, swelling, odor, discharge) and pt accepted teaching.

## 2014-07-28 NOTE — Discharge Instructions (Signed)
Percutaneous Endoscopic Jejunostomy, Care After   Refer to this sheet in the next few weeks. These instructions provide you with information on caring for yourself after your procedure. Your health care provider may also give you more specific instructions. Your treatment has been planned according to current medical practices, but problems sometimes occur. Call your health care provider if you have any problems or questions after your procedure.   WHAT TO EXPECT AFTER THE PROCEDURE   After your procedure, it is typical to have the following:   Pain and tenderness around your feeding tube.   Sore throat.   Slight drainage around your feeding tube.  HOME CARE INSTRUCTIONS   Only take medicines as directed by your health care provider.   Clean the skin around your feeding tube every day. You may have to use a special cleaning solution.   You may also have to apply ointment and change the bandage.   Check the skin around your feeding tube for signs of irritation. Watch for:   Swelling.   Bleeding.   Discharge.  You can take a shower 2 days after the procedure.   Do not take a bath for 2 weeks.   Take short walks often. Try to take at least two 10-minute walks each day.   Make sure you know when and how to start tube feeding. Ask your health care provider if you have any questions.   Ask your health care provider when you can return to all your usual activities.   Keep all follow-up appointments.  SEEK MEDICAL CARE IF:   You have chills or fever.   Your pain medication is not helping.   You have constipation or diarrhea.   You feel bloated or nauseous.   You have questions about your feeding tube.  SEEK IMMEDIATE MEDICAL CARE IF:   You have a fever that lasts longer than 3 days.   You have pain that is very bad.   You have signs of infections near the feeding tube insertion site. Watch for:   Bleeding.   Discharge.   Redness.   Swelling.  You have coughing and trouble breathing.   You have blood in your vomit.   Your  feeding tube is blocked or comes out.  Document Released: 06/15/2013 Document Reviewed: 06/15/2013   Exit[employer] Patient Information 2015 [employer]. This information is not intended to replace advice given to you by your health care provider. Make sure you discuss any questions you have with your health care provider.

## 2014-07-28 NOTE — Progress Notes (Signed)
INITIAL NUTRITION ASSESSMENT  DOCUMENTATION CODES Per approved criteria  -Severe malnutrition in the context of chronic illness  Pt meets criteria for severe MALNUTRITION in the context of chronic illness as evidenced by moderate to severe fat and muscle wasting.  INTERVENTION: - Encourage PO intake. - Recommend Resource Breeze 1-2 per day. Pt to purchase from outpatient pharmacy. - Bolus feeds of 1/2 can Osmolite 1.5 QID at 8 am, 12 pm, 4 pm, and 8 pm. 100 mL flushes before and after each feeding.   As tolerated, pt may increase to 1 can Osmolite 1.5 BID at 8 am and 8 pm with 200 mL flushes before and after each feeding.  Tube feeding regimen provides 711 kcal (55% of estimated needs needs), 30 grams of protein (50% of estimated needs), and 360 ml of H2O. Flushes to provide additional 800 mL of water (1160 mL total).  NUTRITION DIAGNOSIS: Inadequate oral intake related to chronic illness as evidenced by poor po; ongoing  Goal: Pt to meet >/= 90% of their estimated nutrition needs; will be met with TF regimen  Monitor:  Weight trend, po intake, TF initiation and tolerance, labs  64 y.o. female  Admitting Dx: <principal problem not specified>  ASSESSMENT: 63 y.o. female who is afraid that she is going to die because she can't gain weight. She has been admitted and attempts to improve her PO intake have been unsuccessful. She wants a feeding jejunostomy.  2/3: - Pt s/p jejunostomy 2/2. She has a history of J-tube use. Pt reports that she was on an Osmolite formula 3-4 cans per day. - Spoke with surgeon who does not want to start TF at this time. Will continue to encourage PO.  - Pt eating 100% of full liquid diet. Diet upgraded to Regular.  - Pt with no recent weight loss. She reports that she normally has a good appetite at home and that her po is good. Agreed to drink Ensure supplements.  - Pt seen by RD for calorie counts in the past. Pt eating 100-200% of estimated needs at  that time. Concern as to whether pt eats this well at home?  - RD to follow for need to use J-tube.  2/4: - Verbal consult received for home TF recommendations.  - Discussed home TF regimen with physician, RN, and pt. Pt to meet ~50% of needs with bolus tube feeds and is encouraged to continue adequate po intake. Pt agreed to purchase [employer] from pharmacy to supplement diet. Goal regimen is 1 can Osmolite 1.5 BID at 8 am and 8 pm with 200 mL flushes before and after each feeding. This will leave the remainder of the day for pt to be able to consume adequate po.  - Pt to be discharged with home health.   Height: Ht Readings from Last 1 Encounters:  07/26/14 _0  (1.6 m)    Weight: Wt Readings from Last 1 Encounters:  07/26/14 93 lb (42.185 kg)   Estimated Nutritional Needs: Kcal: 1200-1400 Protein: 60-75 g Fluid: 1.4 L/day  Skin: incision on abdomen  Diet Order: Diet regular Diet general  EDUCATION NEEDS: -Education needs addressed   Intake/Output Summary (Last 24 hours) at 07/28/14 1015 Last data filed at 07/28/14 0953  Gross per 24 hour  Intake   2340 ml  Output   1850 ml  Net    490 ml    Last BM: prior to admission   Labs:   Recent Labs Lab 07/22/14 1120 07/26/14 1500 07/27/14 0447  07/28/14 0430  NA 141  --  137 139  K 4.9  --  4.4 4.7  CL 108  --  109 107  CO2 26  --  25 27  BUN 13  --  8 8  CREATININE 0.68 0.94 0.62 0.85  CALCIUM 8.9  --  8.2* 8.4  GLUCOSE 90  --  140* 93    CBG (last 3)  No results for input(s): GLUCAP in the last 72 hours.  Scheduled Meds: . feeding supplement (ENSURE COMPLETE)  237 mL Oral TID BM  . heparin  5,000 Units Subcutaneous 3 times per day  . nicotine  21 mg Transdermal Daily  . risperiDONE  0.25 mg Oral BID  . risperiDONE  0.5 mg Oral QHS    Continuous Infusions: . dextrose 5 % and 0.45 % NaCl with KCl 20 mEq/L 75 mL/hr at 07/28/14 0600    Past Medical History  Diagnosis Date  . Peptic ulcer  disease     dr Oletta Lamas  . Gastric outlet obstruction   . Acute duodenal ulcer with hemorrhage and perforation, with obstruction   . Barrett's esophagus   . Menopause   . GERD (gastroesophageal reflux disease)   . Asthma   . Chronic abdominal pain     narcotic dependence, dr [facility]eng[facility]dak [facility]  . Narcotic abuse   . Anxiety     sees [name] NP at Dr. Radonna Ricker office  . Fx two ribs-open 08-24-11    left 4th and 5th  . Fx of fibula 07-28-11    left fibula, 2 places   . COPD (chronic obstructive pulmonary disease)   . S/P ORIF (open reduction internal fixation) fracture     both hips pinned  . Allergy   . Anemia   . History of blood transfusion   . Lap Nissen + truncal vagotomy July 2008 05/13/2013  . LEG EDEMA 01/19/2008    Qualifier: Diagnosis of  By: [name] MD, [name]   . ACUTE DUODEN ULCER W/HEMORR PERF&OBSTRUCTION 06/26/2004    Qualifier: Diagnosis of  By: [name] MD, [name]   . FEVER, RECURRENT 08/11/2009    Qualifier: Diagnosis of  By: [name] MD, [name] MENOPAUSE 01/07/2007    Qualifier: Diagnosis of  By: [name], CMA, [place]    . Pneumonia, organism unspecified 11/07/2010    Assoc with R Parapneumonic effusion 09/2010    - Tapped 10/05/10    - CxR resolved 11/07/2010    . Gastroparesis 06/03/2007    Qualifier: Diagnosis of  By: [name] MD, [name]   . GASTRIC OUTLET OBSTRUCTION 08/28/2007    In July 2008 she underwent laparoscopic enterolysis, Nissen fundoplication over a #86 bougie, single pledgeted suture colosure of the hiatus and a 3 suture wrap.  Lap truncal vagotomy and a loop gastrojejunostomy was performed.  This was revised in August of 2009 to a roux en Y gastrojejujostomy.     . 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 Complication of anesthesia     pt states had too much xanax on board - agitated   . Shortness of breath dyspnea     with exertion   . Depression   . History of hiatal hernia   . Headache     freq headaches    . Arthritis     Past Surgical History  Procedure Laterality Date  . Esophagogastroduodenoscopy  12-29-09    dr qadeer at [facility], several gastric ulcers  .  Vagotomy      lapaproscopic - nissan  . 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 Hernia repair    . Perforated ulcer    . Biopsy thyroid  08-13-11    benign nodule, per Dr. Melida Quitter   . Esophagogastroduodenoscopy N/A 09/25/2012    Procedure: ESOPHAGOGASTRODUODENOSCOPY (EGD);  Surgeon: Beryle Beams, MD;  Location: [facility];  Service: Endoscopy;  Laterality: N/A;  . Fracture surgery Bilateral     hips with screw  . Tonsillectomy    . Eye surgery Bilateral     cataract extraction with IOL  . Laparoscopic partial gastrectomy N/A 06/29/2013    Procedure: Gastrectomy and GJ Tube placement;  Surgeon: Pedro Earls, MD;  Location: [facility];  Service: General;  Laterality: N/A;  . Colonscopy     . Gastrostomy N/A 07/26/2014    Procedure: LAPRASCOPIC ASSISTED OPEN PLACEMENT OF JEJUNOSTOMY TUBE;  Surgeon: Pedro Earls, MD;  Location: [facility];  Service: General;  Laterality: N/A;    Laurette Schimke MS, RD, LDN

## 2014-07-28 NOTE — Discharge Summary (Signed)
Physician Discharge Summary  Patient ID: [name] MRN: [id number] DOB/AGE: 07/19/1950 64 y.o.  Admit date: 07/26/2014 Discharge date: 07/28/2014  Admission Diagnoses:  malnutrition  Discharge Diagnoses:  same  Active Problems:   S/P jejunostomy Feb 2016   Malnutrition   Surgery:  Lap/open feeding jejunostomy creation  Discharged Condition: improved  Hospital Course:   Had surgery.  She has underlying pain control issues which were addressed.  61 Fr Red Robinson tube placed.  Arrangements made for home health to provide jejunostomy feedings   Consults: none  Significant Diagnostic Studies: none    Discharge Exam: Blood pressure 107/53, pulse 71, temperature 98.2 F (36.8 C), temperature source Oral, resp. rate 16, height 5\' 3"  (1.6 m), weight 93 lb (42.185 kg), SpO2 98 %. Small midline incision and 22 Fr red robinson tube exiting in the left upper quadrant  Disposition: 01-Home or Self Care     Medication List    ASK your doctor about these medications        ALPRAZolam 0.5 MG tablet  Commonly known as:  XANAX  Take 1 tablet (0.5 mg total) by mouth 3 (three) times daily as needed for anxiety.     FLUoxetine 40 MG capsule  Commonly known as:  PROZAC  Take 40 mg by mouth 2 (two) times daily.     HYDROcodone-acetaminophen 5-325 MG per tablet  Commonly known as:  NORCO/VICODIN  Take 1 tablet by mouth every 6 (six) hours as needed for moderate pain or severe pain.     mirtazapine 7.5 MG tablet  Commonly known as:  REMERON  Take 7.5 mg by mouth at bedtime.     multivitamin with minerals Tabs tablet  Take 1 tablet by mouth daily.     omeprazole 40 MG capsule  Commonly known as:  PRILOSEC  Take 40 mg by mouth 2 (two) times daily.     ondansetron 4 MG disintegrating tablet  Commonly known as:  ZOFRAN ODT  4mg  ODT q4 hours prn nausea/vomit     risperiDONE 0.25 MG tablet  Commonly known as:  RISPERDAL  Take 1 tablet (0.25 mg total) by mouth 2 (two) times  daily.     risperiDONE 0.5 MG tablet  Commonly known as:  RISPERDAL  Take 1 tablet (0.5 mg total) by mouth at bedtime.         SignedPedro Earls 07/28/2014, 7:34 AM

## 2014-07-28 NOTE — Care Management Note (Signed)
    Page 1 of 1   07/28/2014     9:55:41 AM CARE MANAGEMENT NOTE 07/28/2014  Patient:  [name], [name]   Account Number:  [id number]  Date Initiated:  07/27/2014  Documentation initiated by:  Sunday Spillers  Subjective/Objective Assessment:   64 yo female admitted s/p jejunostomy tube placement for malnutrition. PTA lived at home alone.     Action/Plan:   Home when stable   Anticipated DC Date:  07/30/2014   Anticipated DC Plan:  [place]  CM consult      [facility] Choice  Resumption Of Svcs/PTA Provider   Choice offered to / List presented to:          [facility] arranged  HH-1 RN  [place].   Status of service:  Completed, signed off Medicare Important Message given?   (If response is "NO", the following Medicare IM given date fields will be blank) Date Medicare IM given:   Medicare IM given by:   Date Additional Medicare IM given:   Additional Medicare IM given by:    Discharge Disposition:  [place]  Per UR Regulation:  Reviewed for med. necessity/level of care/duration of stay  If discussed at [place] of Stay Meetings, dates discussed:    Comments:  07-28-14 Sunday Spillers RN CM 0930 Spoke with patient at bedside. States she has been active with [facility], wants to continue with them. Contacted [facility] to arrange [place] and tube feedings. Will need dietitian to return and provide recommendation for tube feedings and nutrition.

## 2014-07-29 DIAGNOSIS — K311 Adult hypertrophic pyloric stenosis: Secondary | ICD-10-CM | POA: Diagnosis not present

## 2014-07-29 DIAGNOSIS — K2270 Barrett's esophagus without dysplasia: Secondary | ICD-10-CM | POA: Diagnosis not present

## 2014-07-29 DIAGNOSIS — E46 Unspecified protein-calorie malnutrition: Secondary | ICD-10-CM | POA: Diagnosis not present

## 2014-07-29 DIAGNOSIS — F17210 Nicotine dependence, cigarettes, uncomplicated: Secondary | ICD-10-CM | POA: Diagnosis not present

## 2014-07-29 DIAGNOSIS — Z434 Encounter for attention to other artificial openings of digestive tract: Secondary | ICD-10-CM | POA: Diagnosis not present

## 2014-07-29 DIAGNOSIS — J449 Chronic obstructive pulmonary disease, unspecified: Secondary | ICD-10-CM | POA: Diagnosis not present

## 2014-07-29 DIAGNOSIS — K279 Peptic ulcer, site unspecified, unspecified as acute or chronic, without hemorrhage or perforation: Secondary | ICD-10-CM | POA: Diagnosis not present

## 2014-07-29 DIAGNOSIS — J45909 Unspecified asthma, uncomplicated: Secondary | ICD-10-CM | POA: Diagnosis not present

## 2014-07-29 DIAGNOSIS — F419 Anxiety disorder, unspecified: Secondary | ICD-10-CM | POA: Diagnosis not present

## 2014-07-29 DIAGNOSIS — K266 Chronic or unspecified duodenal ulcer with both hemorrhage and perforation: Secondary | ICD-10-CM | POA: Diagnosis not present

## 2014-07-30 DIAGNOSIS — F419 Anxiety disorder, unspecified: Secondary | ICD-10-CM | POA: Diagnosis not present

## 2014-07-30 DIAGNOSIS — F17210 Nicotine dependence, cigarettes, uncomplicated: Secondary | ICD-10-CM | POA: Diagnosis not present

## 2014-07-30 DIAGNOSIS — K311 Adult hypertrophic pyloric stenosis: Secondary | ICD-10-CM | POA: Diagnosis not present

## 2014-07-30 DIAGNOSIS — J45909 Unspecified asthma, uncomplicated: Secondary | ICD-10-CM | POA: Diagnosis not present

## 2014-07-30 DIAGNOSIS — K2270 Barrett's esophagus without dysplasia: Secondary | ICD-10-CM | POA: Diagnosis not present

## 2014-07-30 DIAGNOSIS — E46 Unspecified protein-calorie malnutrition: Secondary | ICD-10-CM | POA: Diagnosis not present

## 2014-07-30 DIAGNOSIS — J449 Chronic obstructive pulmonary disease, unspecified: Secondary | ICD-10-CM | POA: Diagnosis not present

## 2014-07-30 DIAGNOSIS — K279 Peptic ulcer, site unspecified, unspecified as acute or chronic, without hemorrhage or perforation: Secondary | ICD-10-CM | POA: Diagnosis not present

## 2014-07-30 DIAGNOSIS — Z434 Encounter for attention to other artificial openings of digestive tract: Secondary | ICD-10-CM | POA: Diagnosis not present

## 2014-07-30 DIAGNOSIS — K266 Chronic or unspecified duodenal ulcer with both hemorrhage and perforation: Secondary | ICD-10-CM | POA: Diagnosis not present

## 2014-08-01 DIAGNOSIS — J449 Chronic obstructive pulmonary disease, unspecified: Secondary | ICD-10-CM | POA: Diagnosis not present

## 2014-08-01 DIAGNOSIS — Z434 Encounter for attention to other artificial openings of digestive tract: Secondary | ICD-10-CM | POA: Diagnosis not present

## 2014-08-01 DIAGNOSIS — F419 Anxiety disorder, unspecified: Secondary | ICD-10-CM | POA: Diagnosis not present

## 2014-08-01 DIAGNOSIS — K279 Peptic ulcer, site unspecified, unspecified as acute or chronic, without hemorrhage or perforation: Secondary | ICD-10-CM | POA: Diagnosis not present

## 2014-08-01 DIAGNOSIS — K266 Chronic or unspecified duodenal ulcer with both hemorrhage and perforation: Secondary | ICD-10-CM | POA: Diagnosis not present

## 2014-08-01 DIAGNOSIS — K311 Adult hypertrophic pyloric stenosis: Secondary | ICD-10-CM | POA: Diagnosis not present

## 2014-08-01 DIAGNOSIS — J45909 Unspecified asthma, uncomplicated: Secondary | ICD-10-CM | POA: Diagnosis not present

## 2014-08-01 DIAGNOSIS — K3184 Gastroparesis: Secondary | ICD-10-CM | POA: Diagnosis not present

## 2014-08-01 DIAGNOSIS — R634 Abnormal weight loss: Secondary | ICD-10-CM | POA: Diagnosis not present

## 2014-08-01 DIAGNOSIS — E46 Unspecified protein-calorie malnutrition: Secondary | ICD-10-CM | POA: Diagnosis not present

## 2014-08-01 DIAGNOSIS — F17210 Nicotine dependence, cigarettes, uncomplicated: Secondary | ICD-10-CM | POA: Diagnosis not present

## 2014-08-01 DIAGNOSIS — K262 Acute duodenal ulcer with both hemorrhage and perforation: Secondary | ICD-10-CM | POA: Diagnosis not present

## 2014-08-01 DIAGNOSIS — K2270 Barrett's esophagus without dysplasia: Secondary | ICD-10-CM | POA: Diagnosis not present

## 2014-08-03 DIAGNOSIS — K279 Peptic ulcer, site unspecified, unspecified as acute or chronic, without hemorrhage or perforation: Secondary | ICD-10-CM | POA: Diagnosis not present

## 2014-08-03 DIAGNOSIS — F17210 Nicotine dependence, cigarettes, uncomplicated: Secondary | ICD-10-CM | POA: Diagnosis not present

## 2014-08-03 DIAGNOSIS — K311 Adult hypertrophic pyloric stenosis: Secondary | ICD-10-CM | POA: Diagnosis not present

## 2014-08-03 DIAGNOSIS — K2270 Barrett's esophagus without dysplasia: Secondary | ICD-10-CM | POA: Diagnosis not present

## 2014-08-03 DIAGNOSIS — E46 Unspecified protein-calorie malnutrition: Secondary | ICD-10-CM | POA: Diagnosis not present

## 2014-08-03 DIAGNOSIS — F419 Anxiety disorder, unspecified: Secondary | ICD-10-CM | POA: Diagnosis not present

## 2014-08-03 DIAGNOSIS — J45909 Unspecified asthma, uncomplicated: Secondary | ICD-10-CM | POA: Diagnosis not present

## 2014-08-03 DIAGNOSIS — K266 Chronic or unspecified duodenal ulcer with both hemorrhage and perforation: Secondary | ICD-10-CM | POA: Diagnosis not present

## 2014-08-03 DIAGNOSIS — Z434 Encounter for attention to other artificial openings of digestive tract: Secondary | ICD-10-CM | POA: Diagnosis not present

## 2014-08-03 DIAGNOSIS — J449 Chronic obstructive pulmonary disease, unspecified: Secondary | ICD-10-CM | POA: Diagnosis not present

## 2014-08-10 DIAGNOSIS — J449 Chronic obstructive pulmonary disease, unspecified: Secondary | ICD-10-CM | POA: Diagnosis not present

## 2014-08-10 DIAGNOSIS — E46 Unspecified protein-calorie malnutrition: Secondary | ICD-10-CM | POA: Diagnosis not present

## 2014-08-10 DIAGNOSIS — F419 Anxiety disorder, unspecified: Secondary | ICD-10-CM | POA: Diagnosis not present

## 2014-08-10 DIAGNOSIS — Z434 Encounter for attention to other artificial openings of digestive tract: Secondary | ICD-10-CM | POA: Diagnosis not present

## 2014-08-10 DIAGNOSIS — J45909 Unspecified asthma, uncomplicated: Secondary | ICD-10-CM | POA: Diagnosis not present

## 2014-08-10 DIAGNOSIS — K266 Chronic or unspecified duodenal ulcer with both hemorrhage and perforation: Secondary | ICD-10-CM | POA: Diagnosis not present

## 2014-08-10 DIAGNOSIS — K279 Peptic ulcer, site unspecified, unspecified as acute or chronic, without hemorrhage or perforation: Secondary | ICD-10-CM | POA: Diagnosis not present

## 2014-08-10 DIAGNOSIS — F17210 Nicotine dependence, cigarettes, uncomplicated: Secondary | ICD-10-CM | POA: Diagnosis not present

## 2014-08-10 DIAGNOSIS — K2270 Barrett's esophagus without dysplasia: Secondary | ICD-10-CM | POA: Diagnosis not present

## 2014-08-10 DIAGNOSIS — K311 Adult hypertrophic pyloric stenosis: Secondary | ICD-10-CM | POA: Diagnosis not present

## 2014-08-11 ENCOUNTER — Ambulatory Visit (INDEPENDENT_AMBULATORY_CARE_PROVIDER_SITE_OTHER): Payer: Medicare Other | Admitting: Family Medicine

## 2014-08-11 ENCOUNTER — Encounter (HOSPITAL_COMMUNITY): Payer: Self-pay | Admitting: Internal Medicine

## 2014-08-11 ENCOUNTER — Observation Stay (HOSPITAL_COMMUNITY)
Admission: AD | Admit: 2014-08-11 | Discharge: 2014-08-12 | Disposition: A | Discharge status: Home / Self Care | Payer: Medicare Other | Source: Ambulatory Visit | Attending: Internal Medicine | Admitting: Internal Medicine

## 2014-08-11 VITALS — BP 122/80 | HR 81 | Temp 98.5°F | Resp 16 | Ht 63.0 in | Wt 89.6 lb

## 2014-08-11 DIAGNOSIS — K2270 Barrett's esophagus without dysplasia: Secondary | ICD-10-CM | POA: Diagnosis not present

## 2014-08-11 DIAGNOSIS — J449 Chronic obstructive pulmonary disease, unspecified: Secondary | ICD-10-CM | POA: Diagnosis not present

## 2014-08-11 DIAGNOSIS — Z79899 Other long term (current) drug therapy: Secondary | ICD-10-CM | POA: Diagnosis not present

## 2014-08-11 DIAGNOSIS — G8929 Other chronic pain: Secondary | ICD-10-CM | POA: Diagnosis not present

## 2014-08-11 DIAGNOSIS — Z8711 Personal history of peptic ulcer disease: Secondary | ICD-10-CM | POA: Insufficient documentation

## 2014-08-11 DIAGNOSIS — F1120 Opioid dependence, uncomplicated: Secondary | ICD-10-CM | POA: Insufficient documentation

## 2014-08-11 DIAGNOSIS — Z934 Other artificial openings of gastrointestinal tract status: Secondary | ICD-10-CM | POA: Insufficient documentation

## 2014-08-11 DIAGNOSIS — F339 Major depressive disorder, recurrent, unspecified: Secondary | ICD-10-CM | POA: Insufficient documentation

## 2014-08-11 DIAGNOSIS — E860 Dehydration: Secondary | ICD-10-CM | POA: Diagnosis not present

## 2014-08-11 DIAGNOSIS — Z87891 Personal history of nicotine dependence: Secondary | ICD-10-CM | POA: Insufficient documentation

## 2014-08-11 DIAGNOSIS — K219 Gastro-esophageal reflux disease without esophagitis: Secondary | ICD-10-CM | POA: Insufficient documentation

## 2014-08-11 DIAGNOSIS — R109 Unspecified abdominal pain: Secondary | ICD-10-CM | POA: Diagnosis not present

## 2014-08-11 DIAGNOSIS — I951 Orthostatic hypotension: Principal | ICD-10-CM | POA: Diagnosis present

## 2014-08-11 DIAGNOSIS — R531 Weakness: Secondary | ICD-10-CM | POA: Diagnosis present

## 2014-08-11 DIAGNOSIS — F331 Major depressive disorder, recurrent, moderate: Secondary | ICD-10-CM

## 2014-08-11 DIAGNOSIS — F419 Anxiety disorder, unspecified: Secondary | ICD-10-CM | POA: Diagnosis not present

## 2014-08-11 DIAGNOSIS — E43 Unspecified severe protein-calorie malnutrition: Secondary | ICD-10-CM | POA: Diagnosis not present

## 2014-08-11 DIAGNOSIS — E46 Unspecified protein-calorie malnutrition: Secondary | ICD-10-CM | POA: Diagnosis not present

## 2014-08-11 DIAGNOSIS — Z681 Body mass index (BMI) 19 or less, adult: Secondary | ICD-10-CM | POA: Insufficient documentation

## 2014-08-11 LAB — COMPLETE METABOLIC PANEL WITH GFR
ALT: 9 U/L (ref 0–35)
AST: 13 U/L (ref 0–37)
Albumin: 3.4 g/dL — ABNORMAL LOW (ref 3.5–5.2)
Alkaline Phosphatase: 104 U/L (ref 39–117)
BUN: 10 mg/dL (ref 6–23)
CHLORIDE: 99 meq/L (ref 96–112)
CO2: 24 mEq/L (ref 19–32)
CREATININE: 0.54 mg/dL (ref 0.50–1.10)
Calcium: 8.6 mg/dL (ref 8.4–10.5)
GFR, Est Non African American: >89 mL/min
Glucose, Bld: 84 mg/dL (ref 70–99)
Potassium: 4.6 mEq/L (ref 3.5–5.3)
Sodium: 137 mEq/L (ref 135–145)
Total Bilirubin: 0.3 mg/dL (ref 0.2–1.2)
Total Protein: 6.1 g/dL (ref 6.0–8.3)

## 2014-08-11 LAB — POCT CBC
GRANULOCYTE PERCENT: 76.3 % (ref 37–80)
HCT, POC: 39.3 % (ref 37.7–47.9)
Hemoglobin: 12.3 g/dL (ref 12.2–16.2)
Lymph, poc: 2.3 (ref 0.6–3.4)
MCH, POC: 29.1 pg (ref 27–31.2)
MCHC: 31.4 g/dL — AB (ref 31.8–35.4)
MCV: 92.6 fL (ref 80–97)
MID (CBC): 0.3 (ref 0–0.9)
MPV: 7.9 fL (ref 0–99.8)
PLATELET COUNT, POC: 560 10*3/uL — AB (ref 142–424)
POC GRANULOCYTE: 8.4 — AB (ref 2–6.9)
POC LYMPH %: 20.8 % (ref 10–50)
POC MID %: 2.9 % (ref 0–12)
RBC: 4.23 M/uL (ref 4.04–5.48)
RDW, POC: 17.9 %
WBC: 11 10*3/uL — AB (ref 4.6–10.2)

## 2014-08-11 LAB — POCT URINALYSIS DIPSTICK
Bilirubin, UA: NEGATIVE
Glucose, UA: NEGATIVE
KETONES UA: NEGATIVE
Leukocytes, UA: NEGATIVE
NITRITE UA: NEGATIVE
Protein, UA: NEGATIVE
SPEC GRAV UA: 1.015
UROBILINOGEN UA: 0.2
pH, UA: 7.5

## 2014-08-11 LAB — POCT UA - MICROSCOPIC ONLY
Bacteria, U Microscopic: NEGATIVE
Casts, Ur, LPF, POC: NEGATIVE
Crystals, Ur, HPF, POC: NEGATIVE
Mucus, UA: NEGATIVE
RBC, URINE, MICROSCOPIC: NEGATIVE
WBC, Ur, HPF, POC: NEGATIVE
Yeast, UA: NEGATIVE

## 2014-08-11 LAB — CBC
HCT: 34.8 % — ABNORMAL LOW (ref 36.0–46.0)
Hemoglobin: 10.9 g/dL — ABNORMAL LOW (ref 12.0–15.0)
MCH: 29 pg (ref 26.0–34.0)
MCHC: 31.3 g/dL (ref 30.0–36.0)
MCV: 92.6 fL (ref 78.0–100.0)
Platelets: 510 10*3/uL — ABNORMAL HIGH (ref 150–400)
RBC: 3.76 MIL/uL — ABNORMAL LOW (ref 3.87–5.11)
RDW: 15.6 % — AB (ref 11.5–15.5)
WBC: 10.9 10*3/uL — ABNORMAL HIGH (ref 4.0–10.5)

## 2014-08-11 LAB — DIFFERENTIAL
BASOS ABS: 0.2 10*3/uL — AB (ref 0.0–0.1)
BASOS PCT: 2 % — AB (ref 0–1)
EOS PCT: 3 % (ref 0–5)
Eosinophils Absolute: 0.4 10*3/uL (ref 0.0–0.7)
Lymphocytes Relative: 21 % (ref 12–46)
Lymphs Abs: 2.3 10*3/uL (ref 0.7–4.0)
Monocytes Absolute: 0.7 10*3/uL (ref 0.1–1.0)
Monocytes Relative: 6 % (ref 3–12)
NEUTROS ABS: 7.3 10*3/uL (ref 1.7–7.7)
Neutrophils Relative %: 68 % (ref 43–77)

## 2014-08-11 LAB — CREATININE, SERUM
CREATININE: 0.57 mg/dL (ref 0.50–1.10)
GFR calc Af Amer: >90 mL/min (ref >90)

## 2014-08-11 MED ORDER — HEPARIN SODIUM (PORCINE) 5000 UNIT/ML IJ SOLN
5000.0000 [IU] | Freq: Three times a day (TID) | INTRAMUSCULAR | Status: DC
  Administered 2014-08-11 – 2014-08-12 (×2): 5000 [IU] via SUBCUTANEOUS
  Filled 2014-08-11 (×5): qty 1

## 2014-08-11 MED ORDER — ENSURE COMPLETE PO LIQD
237.0000 mL | Freq: Three times a day (TID) | ORAL | Status: DC
  Administered 2014-08-11: 237 mL via ORAL

## 2014-08-11 MED ORDER — RISPERIDONE 0.5 MG PO TABS
0.5000 mg | ORAL_TABLET | Freq: Every day | ORAL | Status: DC
  Administered 2014-08-11: 0.5 mg via ORAL
  Filled 2014-08-11 (×2): qty 1

## 2014-08-11 MED ORDER — RISPERIDONE 0.25 MG PO TABS
0.2500 mg | ORAL_TABLET | Freq: Two times a day (BID) | ORAL | Status: DC
  Administered 2014-08-11 – 2014-08-12 (×2): 0.25 mg via ORAL
  Filled 2014-08-11 (×4): qty 1

## 2014-08-11 MED ORDER — ADULT MULTIVITAMIN W/MINERALS CH
1.0000 | ORAL_TABLET | Freq: Every day | ORAL | Status: DC
  Administered 2014-08-11 – 2014-08-12 (×2): 1 via ORAL
  Filled 2014-08-11 (×2): qty 1

## 2014-08-11 MED ORDER — ALPRAZOLAM 0.5 MG PO TABS
0.5000 mg | ORAL_TABLET | Freq: Three times a day (TID) | ORAL | Status: DC | PRN
  Administered 2014-08-11: 0.5 mg via ORAL
  Filled 2014-08-11: qty 1

## 2014-08-11 MED ORDER — HYDROCODONE-ACETAMINOPHEN 5-325 MG PO TABS
1.0000 | ORAL_TABLET | Freq: Four times a day (QID) | ORAL | Status: DC | PRN
Start: 2014-08-11 — End: 2014-08-12
  Administered 2014-08-11 – 2014-08-12 (×3): 1 via ORAL
  Filled 2014-08-11 (×4): qty 1

## 2014-08-11 MED ORDER — SODIUM CHLORIDE 0.9 % IV SOLN
INTRAVENOUS | Status: DC
  Administered 2014-08-11: 21:00:00 via INTRAVENOUS

## 2014-08-11 MED ORDER — POLYETHYLENE GLYCOL 3350 17 G PO PACK: 17.0000 g | PACK | Freq: Every day | ORAL | Status: DC | PRN

## 2014-08-11 MED ORDER — MIRTAZAPINE 7.5 MG PO TABS
7.5000 mg | ORAL_TABLET | Freq: Every day | ORAL | Status: DC
  Administered 2014-08-11: 7.5 mg via ORAL
  Filled 2014-08-11 (×2): qty 1

## 2014-08-11 MED ORDER — PANTOPRAZOLE SODIUM 40 MG PO TBEC
40.0000 mg | DELAYED_RELEASE_TABLET | Freq: Every day | ORAL | Status: DC
  Administered 2014-08-12: 40 mg via ORAL
  Filled 2014-08-11: qty 1

## 2014-08-11 MED ORDER — FLUOXETINE HCL 20 MG PO CAPS
40.0000 mg | ORAL_CAPSULE | Freq: Two times a day (BID) | ORAL | Status: DC
Start: 2014-08-11 — End: 2014-08-12
  Administered 2014-08-11 – 2014-08-12 (×2): 40 mg via ORAL
  Filled 2014-08-11 (×3): qty 2

## 2014-08-11 MED ORDER — ONDANSETRON HCL 4 MG PO TABS: 4.0000 mg | ORAL_TABLET | Freq: Four times a day (QID) | ORAL | Status: DC | PRN

## 2014-08-11 MED ORDER — ONDANSETRON HCL 4 MG/2ML IJ SOLN: 4.0000 mg | Freq: Four times a day (QID) | INTRAMUSCULAR | Status: DC | PRN

## 2014-08-11 NOTE — H&P (Signed)
[facility] History and Physical  MARGUERITTE GUTHRIDGE MOQ:[id number] DOB: Oct 27, 1950 DOA: 08/11/2014  Referring physician: Dr. Brigitte Pulse PCP: No PCP Per Patient   Chief Complaint: Generalized weakness  HPI: [name] is a 64 y.o. female with past medical history significant for tobacco abuse for more than 50 years of ongoing tobacco abuse status post feeding jejunostomy tube on 07/28/2014 that went to her primary care doctor on the day of admission as her feeding tube came out 1 day prior to admission. She relates she was seen by Dr. Hassell Done after this happened and a bandage was placed on the opening and he told her this cannot be replaced. He agreed to follow her up in 6 weeks and instructed her to continue nutritional supplements. She relates did not is painful not erythematous and without purulent drainage. She relates dizziness upon standing and lightheadedness with generalized weakness and fatigue. She relates she has not been eating as she will like.  In the physician's office: She was found to have a blood pressure of 98/62.  Review of Systems:  Constitutional:  No weight loss, night sweats, Fevers, chills, fatigue.  HEENT:  No headaches, Difficulty swallowing,Tooth/dental problems,Sore throat,  No sneezing, itching, ear ache, nasal congestion, post nasal drip,  Cardio-vascular:  No chest pain, Orthopnea, PND, swelling in lower extremities, anasarca, dizziness, palpitations  GI:  No heartburn, indigestion, abdominal pain, nausea, vomiting, diarrhea, change in bowel habits, loss of appetite  Resp:  No shortness of breath with exertion or at rest. No excess mucus, no productive cough, No non-productive cough, No coughing up of blood.No change in color of mucus.No wheezing.No chest wall deformity  Skin:  no rash or lesions.  GU:  no dysuria, change in color of urine, no urgency or frequency. No flank pain.  Musculoskeletal:  No joint pain or swelling. No decreased range of motion.  No back pain.  Psych:  No change in mood or affect. No depression or anxiety. No memory loss.   Past Medical History  Diagnosis Date  . Peptic ulcer disease     dr Oletta Lamas  . Gastric outlet obstruction   . Acute duodenal ulcer with hemorrhage and perforation, with obstruction   . Barrett's esophagus   . Menopause   . GERD (gastroesophageal reflux disease)   . Asthma   . Chronic abdominal pain     narcotic dependence, dr gyarteng-dak at [facility]  . Narcotic abuse   . Anxiety     sees [name] NP at Dr. Radonna Ricker office  . Fx two ribs-open 08-24-11    left 4th and 5th  . Fx of fibula 07-28-11    left fibula, 2 places   . COPD (chronic obstructive pulmonary disease)   . S/P ORIF (open reduction internal fixation) fracture     both hips pinned  . Allergy   . Anemia   . History of blood transfusion   . Lap Nissen + truncal vagotomy July 2008 05/13/2013  . LEG EDEMA 01/19/2008    Qualifier: Diagnosis of  By: [name] MD, [name]   . ACUTE DUODEN ULCER W/HEMORR PERF&OBSTRUCTION 06/26/2004    Qualifier: Diagnosis of  By: [name] MD, [name]   . FEVER, RECURRENT 08/11/2009    Qualifier: Diagnosis of  By: [name] MD, [name] MENOPAUSE 01/07/2007    Qualifier: Diagnosis of  By: [name], CMA, [place]    . Pneumonia, organism unspecified 11/07/2010    Assoc with R Parapneumonic effusion 09/2010    -  Tapped 10/05/10    - CxR resolved 11/07/2010    . Gastroparesis 06/03/2007    Qualifier: Diagnosis of  By: [name] MD, [name]   . GASTRIC OUTLET OBSTRUCTION 08/28/2007    In July 2008 she underwent laparoscopic enterolysis, Nissen fundoplication over a #27 bougie, single pledgeted suture colosure of the hiatus and a 3 suture wrap.  Lap truncal vagotomy and a loop gastrojejunostomy was performed.  This was revised in August of 2009 to a roux en Y gastrojejujostomy.     . 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 Complication of anesthesia     pt states had too much xanax  on board - agitated   . Shortness of breath dyspnea     with exertion   . Depression   . History of hiatal hernia   . Headache     freq headaches   . Arthritis    Past Surgical History  Procedure Laterality Date  . Esophagogastroduodenoscopy  12-29-09    dr qadeer at [facility], several gastric ulcers  . Vagotomy      lapaproscopic - nissan  . 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 Hernia repair    . Perforated ulcer    . Biopsy thyroid  08-13-11    benign nodule, per Dr. Melida Quitter   . Esophagogastroduodenoscopy N/A 09/25/2012    Procedure: ESOPHAGOGASTRODUODENOSCOPY (EGD);  Surgeon: Beryle Beams, MD;  Location: [facility];  Service: Endoscopy;  Laterality: N/A;  . Fracture surgery Bilateral     hips with screw  . Tonsillectomy    . Eye surgery Bilateral     cataract extraction with IOL  . Laparoscopic partial gastrectomy N/A 06/29/2013    Procedure: Gastrectomy and GJ Tube placement;  Surgeon: Pedro Earls, MD;  Location: [facility];  Service: General;  Laterality: N/A;  . Colonscopy     . Gastrostomy N/A 07/26/2014    Procedure: LAPRASCOPIC ASSISTED OPEN PLACEMENT OF JEJUNOSTOMY TUBE;  Surgeon: Pedro Earls, MD;  Location: [facility];  Service: General;  Laterality: N/A;   Social History:  reports that she has been smoking Cigarettes.  She has a 90 pack-year smoking history. She has never used smokeless tobacco. She reports that she does not drink alcohol or use illicit drugs.  Allergies  Allergen Reactions  . Celebrex [Celecoxib] Nausea Only  . Morphine Itching    REACTION: itching  . Quetiapine     REACTION: tardive dyskinesia  . Varenicline Tartrate     REACTION: unspecified    Family History  Problem Relation Age of Onset  . Cancer Mother     kidney, magliant tumor on face  . Stroke Mother   . Celiac disease Father   . Cancer Father     kidney and pancreatic   Prior to Admission medications   Medication Sig Start Date End Date Taking? Authorizing  Provider  ALPRAZolam [name]) 0.5 MG tablet Take 1 tablet (0.5 mg total) by mouth 3 (three) times daily as needed for anxiety. Patient not taking: Reported on 08/11/2014 04/01/14   Pedro Earls, MD  FLUoxetine (PROZAC) 40 MG capsule Take 40 mg by mouth 2 (two) times daily.    Historical Provider, MD  HYDROcodone-acetaminophen (NORCO/VICODIN) 5-325 MG per tablet Take 1 tablet by mouth every 6 (six) hours as needed for moderate pain. Patient not taking: Reported on 08/11/2014 07/28/14   Pedro Earls, MD  mirtazapine (REMERON) 7.5 MG tablet Take 7.5  mg by mouth at bedtime.    Historical Provider, MD  Multiple Vitamin (MULTIVITAMIN WITH MINERALS) TABS tablet Take 1 tablet by mouth daily.    Historical Provider, MD  omeprazole (PRILOSEC) 40 MG capsule Take 40 mg by mouth 2 (two) times daily.  07/02/14   Historical Provider, MD  ondansetron (ZOFRAN ODT) 4 MG disintegrating tablet 4mg  ODT q4 hours prn nausea/vomit Patient not taking: Reported on 08/11/2014 06/02/14   Shawnee Knapp, MD  risperiDONE (RISPERDAL) 0.25 MG tablet Take 1 tablet (0.25 mg total) by mouth 2 (two) times daily. Patient not taking: Reported on 08/11/2014 01/05/14   Pedro Earls, MD  risperiDONE (RISPERDAL) 0.5 MG tablet Take 1 tablet (0.5 mg total) by mouth at bedtime. Patient not taking: Reported on 08/11/2014 01/05/14   Pedro Earls, MD   Physical Exam: Filed Vitals:   08/11/14 1500  BP: 123/61  Pulse: 75  Temp: 97.4 F (36.3 C)  TempSrc: Oral  Resp: 18  SpO2: 99%    Wt Readings from Last 3 Encounters:  08/11/14 40.642 kg (89 lb 9.6 oz)  07/26/14 42.185 kg (93 lb)  07/22/14 42.185 kg (93 lb)    General:  Appears calm and comfortable, cachectic, appears older than her age Eyes: PERRL, normal lids, irises & conjunctiva ENT: grossly normal hearing, lips & tongue Neck: no LAD, masses or thyromegaly Cardiovascular: RRR, no m/r/g. No LE edema. Telemetry: SR, no arrhythmias  Respiratory: CTA bilaterally, no w/r/r.  Normal respiratory effort. Abdomen: soft, with a little nodule on the left upper quadrant about 1 cm in diameter, exquisitely tender to palpation she has no diffuse rebound or guarding. The nodule is not erythematous and there is no purulent drainage Skin: no rash or induration seen on limited exam Musculoskeletal: grossly normal tone BUE/BLE Psychiatric: grossly normal mood and affect, speech fluent and appropriate Neurologic: grossly non-focal.          Labs on Admission:  Basic Metabolic Panel: No results for input(s): NA, K, CL, CO2, GLUCOSE, BUN, CREATININE, CALCIUM, MG, PHOS in the last 168 hours. Liver Function Tests: No results for input(s): AST, ALT, ALKPHOS, BILITOT, PROT, ALBUMIN in the last 168 hours. No results for input(s): LIPASE, AMYLASE in the last 168 hours. No results for input(s): AMMONIA in the last 168 hours. CBC:  Recent Labs Lab 08/11/14 1314  WBC 11.0*  HGB 12.3  HCT 39.3  MCV 92.6   Cardiac Enzymes: No results for input(s): CKTOTAL, CKMB, CKMBINDEX, TROPONINI in the last 168 hours.  BNP (last 3 results) No results for input(s): BNP in the last 8760 hours.  ProBNP (last 3 results) No results for input(s): PROBNP in the last 8760 hours.  CBG: No results for input(s): GLUCAP in the last 168 hours.  Radiological Exams on Admission: No results found.  EKG: Independently reviewed. none  Assessment/Plan Orthostatic hypotension - She described dizziness upon standing, I will go ahead and start her on IV fluids check urinary sodium check orthostatics. - She is afebrile CBC was checked at the physician's office which shows a white count of 11 I will go ahead and repeat the CBC with a differential. - We'll give her regular diet and will reevaluate in the morning once the labs are back. Major depressive disorder, recurrent episode, moderate  Chronic abdominal pain: - Continue home dose of narcotics. - She cannot have additional narcotics.  Severe  protein-calorie malnutrition - Continue ensure 3 times a day. - Nutrition consult.    Code Status: full  DVT Prophylaxis:heparin Family Communication: none Disposition Plan: obervation  Time spent: 60 min  Charlynne Cousins [facility] Pager [phone]

## 2014-08-11 NOTE — Patient Instructions (Signed)
You will be admitted to [facility] for refeeding.  You will see the "admitting team" at [facility] with [facility] - Dr. Doyle Askew is the name on your chart but will likely not be the physician you see.  Go to the admitting desk - you are at room 21 on [address].  Malnutrition Many of [place] think of malnutrition as a condition in which there is not enough to eat. Malnutrition is actually any condition where nutrition is poor. This means:  Too much to eat as we see in conditions of obesity.  Too little to eat with starvation. The following information is only for the malnourished with dietary deficiencies (poor diet). CAUSES  Under-nutrition can result from:  Poor intake.  Malabsorption  Lactation  Bleeding  Diarrhea  Old age.  Kidney failure.  Infancy  Poverty  Infection  Adolescence  Excessive sweating  Drug addiction  Pregnancy  Early childhood. Under-nutrition comes anytime the demand is more than the intake. SYMPTOMS  The problems depend on what type of malnutrition is present. Some general symptoms include:  Fatigue.  Dizziness.  Fainting  Weight loss.  Poor immune response.  Lack of menstruation.  Lack of growth in children.  Hair loss. DIAGNOSIS  Your caregiver will usually suspect malnutrition based on results of your:   Medical and dietary history.  Physical exam. This will often include measurements of your BMI (body mass index).  Perhaps some blood tests. These may include: plasma levels of nutrients and nutrient-dependent substances, such as:  Hemoglobin  Thyroid hormones  Transferrin  Albumin RISK FACTORS Persons in the following circumstances may be at risk of malnutrition.  Infants and children are at risk of under-nutrition. This is because of their high demand for energy and essential nutrients. Protein-energy malnutrition in children consuming inadequate amounts of protein, calories, and other nutrients  is a particularly severe form of under-nutrition that delays growth and development. This includes [place].  Hemorrhagic disease of the newborn is a life-threatening disorder. This is due to a lack of vitamin K, iron, folic acid, vitamin C, copper, zinc, and vitamin A. This may occur in inadequately fed infants and children.  In adolescence, nutritional requirements increase because they are growing. Anorexia nervosa, a form of starvation, may affect adolescents.  Pregnancy and lactation. Requirements for all nutrients are increased during pregnancy and lactation.  Abnormal diets, such as pica (the consumption of nonnutritive substances, such as clay and charcoal), are common in pregnancy.  Anemia due to folic acid deficiency is common in pregnant women. This is especially true for those who have taken oral contraceptives. Folic acid supplements are now recommended for pregnant women. Folic acid prevents neural tube defects (spina bifida) in children.  Breast-fed-only infants may develop vitamin B12 deficiency if the mother is a vegan.  An alcoholic mother may have a handicapped and stunted child with fetal alcohol syndrome. This is due to the effects of alcohol on the fetus. Do not drink during pregnancy.  Old age: A weakened sense of taste and smell, loneliness, physical and mental handicaps, immobility, and chronic illness can hurt the food intake in the elderly. Absorption is reduced. This may add to iron deficiency, calcium and bone problems and also a softening of the bones due to lack of vitamin D. This is also made worse by not being in the sun.  With aging, we loose lean body mass. These changes and a reduction in physical activity result in lower energy and  protein requirements compared with those of younger adults.  Chronic disease including malabsorption states (including those resulting from surgery) tend to impair the absorption of fat-soluble vitamins, vitamin B12,  calcium, and iron.  Liver disease impairs the storage of vitamins A and B12. It also interferes with the metabolism of protein and energy sources.  Kidney disease may cause deficiencies of protein, iron, and vitamin D.  Cancer and AIDS may cause anorexia. This is a loss of appetite.  Vegetarian diet. The most common form of this type of diet is when meat and fish are not eaten, but eggs and dairy products are eaten. Iron deficiency is the only risk. Ovo-lacto vegetarians tend to live longer and to develop fewer chronic disabling conditions than their meat-eating peers. However, their lifestyle usually includes regular exercise and abstention from alcohol and tobacco. This may contribute to better health. Vegans consume no animal products and are susceptible to vitamin B12 deficiency. Yeast extracts and oriental-style fermented foods provide this vitamin. Intake of calcium, iron, and zinc also tends to be low. A fruitarian diet (eat only fruit) is deficient in protein, salt, and many micronutrients. This is not recommended.  Fad diets: Many commercial diets are claimed to enhance well-being or reduce weight. A physician should be alert to early evidence of nutrient deficiency or toxicity in patients on these diets. Such diets have resulted in vitamin, mineral, and protein deficiency states and cardiac, renal, and metabolic disorders. Some fad diets have resulted in death. People on very low calorie diets (less than 400 kcal/day) cannot sustain health for long. Some trace mineral supplements have induced toxicity.  Alcohol or drug dependency: Addiction leads to a troubled lifestyle in which adequate nourishment is ignored. Absorption and metabolism of nutrients are impaired. High levels of alcohol are poisonous. Too much alcohol can cause tissue injury, particularly of the GI tract, liver, pancreas, brain, and peripheral nervous system. Beer drinkers who consume food may gain weight, but alcoholics who use  more than one quart of hard liquor per day lose weight and become undernourished. Drug addicts are usually very skinny. Alcoholism is the most common cause of thiamine deficiency and may lead to deficiencies of magnesium, zinc, and other vitamins. TREATMENT  Get treatment if you experience changes in how your body is working.  PREVENTION  Eating a good, well-balanced diet helps to prevent most forms of malnutrition. Document Released: 04/26/2005 Document Revised: 09/02/2011 Document Reviewed: 05/18/2007 [facility] Patient Information 2015 [place], [place]. This information is not intended to replace advice given to you by your health care provider. Make sure you discuss any questions you have with your health care provider.

## 2014-08-11 NOTE — Progress Notes (Signed)
This chart was scribed for [name] Cheadle, MD by Lowella Petties, ED Scribe. The patient was seen in room 1. Patient's care was started at 11:50 AM.  Subjective:   Patient ID: [name], female    DOB: 1950/11/11, 64 y.o.   MRN: [id number] Chief Complaint  Patient presents with  . feeding tube fell out x1 week ago    having pain surgeon told patient to be seen by pcp    HPI  HPI Comments: [name] is a 64 y.o. female who presents to the [facility] complaining of a feeding tube that fell out of her abdomen one week ago. She was seen by Dr. Hassell Done for this after it happened, and he bandaged the hole in her stomach and told her that the tube could not be replaced. He agreed to follow up with her in 6 weeks, and instructed that she use a "breeze" food supplement. She states that now there is a painful "knot" on her abdomen where the hole was. She reports that the feeding tube was placed after a recent surgery. She reports associated dizziness, weakness, lightheadedness, and fatigue secondary to dehydration and low calorie intake. She reports that she was able to use the feeding tube about 10 times before it fell out, and that she got up to 93 lbs during this period. She states that she has been urinating and defecating normally. She has been drinking coke, coffee, and juice, but she does not drink water. She denies dysuria and urinary incontinence. She reports a history of perforated ulcers, and chronic scarring on her abdomen from using a heating pad for 30 years. She reports walking 10-20 minutes twice per day.    Past Medical History  Diagnosis Date  . Peptic ulcer disease     dr Oletta Lamas  . Gastric outlet obstruction   . Acute duodenal ulcer with hemorrhage and perforation, with obstruction   . Barrett's esophagus   . Menopause   . GERD (gastroesophageal reflux disease)   . Asthma   . Chronic abdominal pain     narcotic dependence, dr gyarteng-dak [facility]  . Narcotic abuse   . Anxiety     sees [name] NP at Dr. Radonna Ricker office  . Fx two ribs-open 08-24-11    left 4th and 5th  . Fx of fibula 07-28-11    left fibula, 2 places   . COPD (chronic obstructive pulmonary disease)   . S/P ORIF (open reduction internal fixation) fracture     both hips pinned  . Allergy   . Anemia   . History of blood transfusion   . Lap Nissen + truncal vagotomy July 2008 05/13/2013  . LEG EDEMA 01/19/2008    Qualifier: Diagnosis of  By: [name] MD, [name]   . ACUTE DUODEN ULCER W/HEMORR PERF&OBSTRUCTION 06/26/2004    Qualifier: Diagnosis of  By: [name] MD, [name]   . FEVER, RECURRENT 08/11/2009    Qualifier: Diagnosis of  By: [name] MD, [name] MENOPAUSE 01/07/2007    Qualifier: Diagnosis of  By: [name], CMA, [place]    . Pneumonia, organism unspecified 11/07/2010    Assoc with R Parapneumonic effusion 09/2010    - Tapped 10/05/10    - CxR resolved 11/07/2010    . Gastroparesis 06/03/2007    Qualifier: Diagnosis of  By: [name] MD, [name]   . GASTRIC OUTLET OBSTRUCTION 08/28/2007    In July 2008 she  underwent laparoscopic enterolysis, Nissen fundoplication over a #31 bougie, single pledgeted suture colosure of the hiatus and a 3 suture wrap.  Lap truncal vagotomy and a loop gastrojejunostomy was performed.  This was revised in August of 2009 to a roux en Y gastrojejujostomy.     . 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 Complication of anesthesia     pt states had too much xanax on board - agitated   . Shortness of breath dyspnea     with exertion   . Depression   . History of hiatal hernia   . Headache     freq headaches   . Arthritis    Current Outpatient Prescriptions on File Prior to Visit  Medication Sig Dispense Refill  . FLUoxetine (PROZAC) 40 MG capsule Take 40 mg by mouth 2 (two) times daily.    Marland Kitchen omeprazole (PRILOSEC) 40 MG capsule Take 40 mg by mouth 2 (two) times daily.   11  . ALPRAZolam (XANAX) 0.5 MG tablet  Take 1 tablet (0.5 mg total) by mouth 3 (three) times daily as needed for anxiety. (Patient not taking: Reported on 08/11/2014) 30 tablet 0  . HYDROcodone-acetaminophen (NORCO/VICODIN) 5-325 MG per tablet Take 1 tablet by mouth every 6 (six) hours as needed for moderate pain. (Patient not taking: Reported on 08/11/2014) 30 tablet 0  . mirtazapine (REMERON) 7.5 MG tablet Take 7.5 mg by mouth at bedtime.    . Multiple Vitamin (MULTIVITAMIN WITH MINERALS) TABS tablet Take 1 tablet by mouth daily.    . ondansetron (ZOFRAN ODT) 4 MG disintegrating tablet 4mg  ODT q4 hours prn nausea/vomit (Patient not taking: Reported on 08/11/2014) 30 tablet 0  . risperiDONE (RISPERDAL) 0.25 MG tablet Take 1 tablet (0.25 mg total) by mouth 2 (two) times daily. (Patient not taking: Reported on 08/11/2014) 60 tablet 1  . risperiDONE (RISPERDAL) 0.5 MG tablet Take 1 tablet (0.5 mg total) by mouth at bedtime. (Patient not taking: Reported on 08/11/2014) 30 tablet 1   No current facility-administered medications on file prior to visit.   Allergies  Allergen Reactions  . Celebrex [Celecoxib] Nausea Only  . Morphine Itching    REACTION: itching  . Quetiapine     REACTION: tardive dyskinesia  . Varenicline Tartrate     REACTION: unspecified     Review of Systems  Constitutional: Positive for fatigue. Negative for fever and chills.  Neurological: Positive for dizziness, weakness and light-headedness.     BP 98/62 mmHg  Pulse 84  Temp(Src) 98.5 F (36.9 C) (Oral)  Resp 16  Ht 5\' 3"  (1.6 m)  Wt 89 lb 9.6 oz (40.642 kg)  BMI 15.88 kg/m2  SpO2 100%  Objective:  Physical Exam  Constitutional: She is oriented to person, place, and time. No distress.  Emaciated  HENT:  Head: Normocephalic and atraumatic.  Mouth/Throat: No oropharyngeal exudate.  Eyes: Conjunctivae and EOM are normal. Pupils are equal, round, and reactive to light.  Neck: Neck supple. No tracheal deviation present.  Cardiovascular: Normal rate.     Pulmonary/Chest: Effort normal. No respiratory distress.  Abdominal:  1/2 CM of scar tissue in LUQ where feeding tube was placed, no warmth or induration. Chronic ecchymosis across abdomen.   Musculoskeletal: Normal range of motion.  Neurological: She is alert and oriented to person, place, and time.  Skin: Skin is warm and dry.  Psychiatric: She has a normal mood and affect. Her behavior is normal.  Nursing note and vitals reviewed.  Assessment &  Plan:  Malnourished - Plan: POCT CBC, POCT urinalysis dipstick, POCT UA - Microscopic Only, COMPLETE METABOLIC PANEL WITH GFR  Dehydration - Plan: POCT CBC, POCT urinalysis dipstick, POCT UA - Microscopic Only, COMPLETE METABOLIC PANEL WITH GFR  Severe protein-calorie malnutrition - Plan: COMPLETE METABOLIC PANEL WITH GFR, CANCELED: Comprehensive metabolic panel  When pt was hospitalized 6 mos proir for 4d, she has significant improvement in symptoms and was able to increase intake. Pt and her roommate very concerned that she was only kept inpt x 1d after jejunostomy placememnt 2 wks prior - fell out 1 wk ago and pt's surgeon Dr. Hassell Done told her it cannot be replaced.  Pt is recommended to f/u w/ surgeon in 6 mos to review any other options. Weight is lowest it has been -she has lost 45 lbs in the past 3 yrs and 15 lbs in the past year.  She is going to start protein supp drinks - just got a case of Breeze into her pharmacy.  Pt and her roommate are both asking for pt to be hospitalized for refeeding so case discussed with Dr. [facility] hosp - [facility] will be [facility].   I personally performed the services described in this documentation, which was scribed in my presence. The recorded information has been reviewed and considered, and addended by me as needed.  [name] Cheadle, MD MPH   Results for orders placed or performed in visit on 08/11/14  COMPLETE METABOLIC PANEL WITH GFR  Result Value Ref Range   Sodium 137 135 - 145 mEq/L    Potassium 4.6 3.5 - 5.3 mEq/L   Chloride 99 96 - 112 mEq/L   CO2 24 19 - 32 mEq/L   Glucose, Bld 84 70 - 99 mg/dL   BUN 10 6 - 23 mg/dL   Creat 0.54 0.50 - 1.10 mg/dL   Total Bilirubin 0.3 0.2 - 1.2 mg/dL   Alkaline Phosphatase 104 39 - 117 U/L   AST 13 0 - 37 U/L   ALT 9 0 - 35 U/L   Total Protein 6.1 6.0 - 8.3 g/dL   Albumin 3.4 (L) 3.5 - 5.2 g/dL   Calcium 8.6 8.4 - 10.5 mg/dL   GFR, Est African American >89 mL/min   GFR, Est Non African American >89 mL/min  POCT CBC  Result Value Ref Range   WBC 11.0 (A) 4.6 - 10.2 K/uL   Lymph, poc 2.3 0.6 - 3.4   POC LYMPH PERCENT 20.8 10 - 50 %L   MID (cbc) 0.3 0 - 0.9   POC MID % 2.9 0 - 12 %M   POC Granulocyte 8.4 (A) 2 - 6.9   Granulocyte percent 76.3 37 - 80 %G   RBC 4.23 4.04 - 5.48 M/uL   Hemoglobin 12.3 12.2 - 16.2 g/dL   HCT, POC 39.3 37.7 - 47.9 %   MCV 92.6 80 - 97 fL   MCH, POC 29.1 27 - 31.2 pg   MCHC 31.4 (A) 31.8 - 35.4 g/dL   RDW, POC 17.9 %   Platelet Count, POC 560 (A) 142 - 424 K/uL   MPV 7.9 0 - 99.8 fL  POCT urinalysis dipstick  Result Value Ref Range   Color, UA yellow    Clarity, UA clear    Glucose, UA neg    Bilirubin, UA neg    Ketones, UA neg    Spec Grav, UA 1.015    Blood, UA tr-intact    pH, UA 7.5  Protein, UA neg    Urobilinogen, UA 0.2    Nitrite, UA neg    Leukocytes, UA Negative   POCT UA - Microscopic Only  Result Value Ref Range   WBC, Ur, HPF, POC neg    RBC, urine, microscopic neg    Bacteria, U Microscopic neg    Mucus, UA neg    Epithelial cells, urine per micros 0-2    Crystals, Ur, HPF, POC neg    Casts, Ur, LPF, POC neg    Yeast, UA neg

## 2014-08-12 DIAGNOSIS — F17210 Nicotine dependence, cigarettes, uncomplicated: Secondary | ICD-10-CM | POA: Diagnosis not present

## 2014-08-12 DIAGNOSIS — E46 Unspecified protein-calorie malnutrition: Secondary | ICD-10-CM | POA: Diagnosis not present

## 2014-08-12 DIAGNOSIS — G8929 Other chronic pain: Secondary | ICD-10-CM | POA: Diagnosis not present

## 2014-08-12 DIAGNOSIS — Z79899 Other long term (current) drug therapy: Secondary | ICD-10-CM | POA: Diagnosis not present

## 2014-08-12 DIAGNOSIS — F339 Major depressive disorder, recurrent, unspecified: Secondary | ICD-10-CM | POA: Diagnosis not present

## 2014-08-12 DIAGNOSIS — J449 Chronic obstructive pulmonary disease, unspecified: Secondary | ICD-10-CM | POA: Diagnosis not present

## 2014-08-12 DIAGNOSIS — R109 Unspecified abdominal pain: Secondary | ICD-10-CM | POA: Diagnosis not present

## 2014-08-12 DIAGNOSIS — I951 Orthostatic hypotension: Secondary | ICD-10-CM | POA: Diagnosis not present

## 2014-08-12 DIAGNOSIS — Z87891 Personal history of nicotine dependence: Secondary | ICD-10-CM | POA: Diagnosis not present

## 2014-08-12 DIAGNOSIS — F1120 Opioid dependence, uncomplicated: Secondary | ICD-10-CM | POA: Diagnosis not present

## 2014-08-12 DIAGNOSIS — F419 Anxiety disorder, unspecified: Secondary | ICD-10-CM | POA: Diagnosis not present

## 2014-08-12 DIAGNOSIS — K266 Chronic or unspecified duodenal ulcer with both hemorrhage and perforation: Secondary | ICD-10-CM | POA: Diagnosis not present

## 2014-08-12 DIAGNOSIS — J45909 Unspecified asthma, uncomplicated: Secondary | ICD-10-CM | POA: Diagnosis not present

## 2014-08-12 DIAGNOSIS — K219 Gastro-esophageal reflux disease without esophagitis: Secondary | ICD-10-CM | POA: Diagnosis not present

## 2014-08-12 DIAGNOSIS — K279 Peptic ulcer, site unspecified, unspecified as acute or chronic, without hemorrhage or perforation: Secondary | ICD-10-CM | POA: Diagnosis not present

## 2014-08-12 DIAGNOSIS — Z934 Other artificial openings of gastrointestinal tract status: Secondary | ICD-10-CM | POA: Diagnosis not present

## 2014-08-12 DIAGNOSIS — K2270 Barrett's esophagus without dysplasia: Secondary | ICD-10-CM | POA: Diagnosis not present

## 2014-08-12 DIAGNOSIS — E43 Unspecified severe protein-calorie malnutrition: Secondary | ICD-10-CM | POA: Diagnosis not present

## 2014-08-12 DIAGNOSIS — Z434 Encounter for attention to other artificial openings of digestive tract: Secondary | ICD-10-CM | POA: Diagnosis not present

## 2014-08-12 DIAGNOSIS — Z681 Body mass index (BMI) 19 or less, adult: Secondary | ICD-10-CM | POA: Diagnosis not present

## 2014-08-12 DIAGNOSIS — K311 Adult hypertrophic pyloric stenosis: Secondary | ICD-10-CM | POA: Diagnosis not present

## 2014-08-12 DIAGNOSIS — Z8711 Personal history of peptic ulcer disease: Secondary | ICD-10-CM | POA: Diagnosis not present

## 2014-08-12 LAB — COMPREHENSIVE METABOLIC PANEL
ALT: 10 U/L (ref 0–35)
AST: 14 U/L (ref 0–37)
Albumin: 2.4 g/dL — ABNORMAL LOW (ref 3.5–5.2)
Alkaline Phosphatase: 76 U/L (ref 39–117)
Anion gap: 6 (ref 5–15)
BUN: 12 mg/dL (ref 6–23)
CHLORIDE: 111 mmol/L (ref 96–112)
CO2: 27 mmol/L (ref 19–32)
Calcium: 8.1 mg/dL — ABNORMAL LOW (ref 8.4–10.5)
Creatinine, Ser: 0.48 mg/dL — ABNORMAL LOW (ref 0.50–1.10)
GFR calc Af Amer: >90 mL/min (ref >90)
GFR calc non Af Amer: >90 mL/min (ref >90)
Glucose, Bld: 79 mg/dL (ref 70–99)
Potassium: 3.9 mmol/L (ref 3.5–5.1)
Sodium: 144 mmol/L (ref 135–145)
TOTAL PROTEIN: 5.1 g/dL — AB (ref 6.0–8.3)
Total Bilirubin: 0.4 mg/dL (ref 0.3–1.2)

## 2014-08-12 LAB — CBC
HCT: 31.5 % — ABNORMAL LOW (ref 36.0–46.0)
Hemoglobin: 9.9 g/dL — ABNORMAL LOW (ref 12.0–15.0)
MCH: 29 pg (ref 26.0–34.0)
MCHC: 31.4 g/dL (ref 30.0–36.0)
MCV: 92.4 fL (ref 78.0–100.0)
PLATELETS: 422 10*3/uL — AB (ref 150–400)
RBC: 3.41 MIL/uL — ABNORMAL LOW (ref 3.87–5.11)
RDW: 15.9 % — AB (ref 11.5–15.5)
WBC: 7 10*3/uL (ref 4.0–10.5)

## 2014-08-12 LAB — SODIUM, URINE, RANDOM: SODIUM UR: 48 meq/L

## 2014-08-12 LAB — CREATININE, URINE, RANDOM: Creatinine, Urine: 55 mg/dL

## 2014-08-12 MED ORDER — HYDROCODONE-ACETAMINOPHEN 5-325 MG PO TABS: 1.0000 | ORAL_TABLET | Freq: Four times a day (QID) | ORAL | Status: DC | PRN

## 2014-08-12 NOTE — Progress Notes (Signed)
Patient given discharge instructions, and verbalized an understanding of all discharge instructions.  Patient agrees with discharge plan, and is being discharged in stable medical condition.  Patient given transportation via wheelchair. 

## 2014-08-12 NOTE — Progress Notes (Signed)
UR completed 

## 2014-08-12 NOTE — Discharge Summary (Signed)
Physician Discharge Summary  GISSELA BLOCH ASN:[id number] DOB: July 12, 1950 DOA: 08/11/2014  PCP: No PCP Per Patient  Admit date: 08/11/2014 Discharge date: 08/12/2014  Time spent: 30 minutes  Recommendations for Outpatient Follow-up:  1. Follow up with PCP  Discharge Diagnoses:  Principal Problem:   Orthostatic hypotension Active Problems:   Major depressive disorder, recurrent episode, moderate   Chronic abdominal pain   Severe protein-calorie malnutrition   Discharge Condition: stable  Diet recommendation: regular  Filed Weights   08/11/14 1635  Weight: 40.6 kg (89 lb 8.1 oz)    History of present illness:  64 y.o. female with past medical history significant for tobacco abuse for more than 50 years of ongoing tobacco abuse status post feeding jejunostomy tube on 07/28/2014 that went to her primary care doctor on the day of admission as her feeding tube came out 1 day prior to admission. She relates she was seen by Dr. Hassell Done after this happened and a bandage was placed on the opening and he told her this cannot be replaced. He agreed to follow her up in 6 weeks and instructed her to continue nutritional supplements. She relates did not is painful not erythematous and without purulent drainage. She relates dizziness upon standing and lightheadedness with generalized weakness and fatigue. She relates she has not been eating as she will like.  Hospital Course:  Orthostatic hypotension - She was start on IV fluids. By the next day her symptoms were resolved, her leukocytosis resolved. - She remained afebrile she was tolerating her diet. - She was discharged home and recommended to consume more water.  Chronic abdominal pain: - Continue home dose of narcotics.  Severe protein-calorie malnutrition - Continue ensure 3 times a day. - Nutrition consult. - The case was discussed with surgery and they recommended they cannot put the jejunostomy tube  back.   Procedures:  none  Consultations:  none  Discharge Exam: Filed Vitals:   08/12/14 0528  BP: 112/57  Pulse: 78  Temp: 97.8 F (36.6 C)  Resp: 18    General: A&O x3 Cardiovascular: RRR Respiratory: good air movement  Discharge Instructions   Discharge Instructions    Diet - low sodium heart healthy    Complete by:  As directed      Increase activity slowly    Complete by:  As directed           Current Discharge Medication List    CONTINUE these medications which have CHANGED   Details  HYDROcodone-acetaminophen (NORCO/VICODIN) 5-325 MG per tablet Take 1 tablet by mouth every 6 (six) hours as needed for moderate pain. Qty: 10 tablet, Refills: 0      CONTINUE these medications which have NOT CHANGED   Details  ALPRAZolam (XANAX) 0.5 MG tablet Take 1 tablet (0.5 mg total) by mouth 3 (three) times daily as needed for anxiety. Qty: 30 tablet, Refills: 0    aspirin 81 MG tablet Take 81 mg by mouth daily as needed for fever (fever).    FLUoxetine (PROZAC) 40 MG capsule Take 80 mg by mouth daily.     mirtazapine (REMERON) 7.5 MG tablet Take 7.5 mg by mouth at bedtime.    Multiple Vitamin (MULTIVITAMIN WITH MINERALS) TABS tablet Take 1 tablet by mouth daily.    omeprazole (PRILOSEC) 40 MG capsule Take 40 mg by mouth 2 (two) times daily.  Refills: 11    ondansetron (ZOFRAN ODT) 4 MG disintegrating tablet 4mg  ODT q4 hours prn nausea/vomit Qty: 30 tablet, Refills:  0    !! risperiDONE (RISPERDAL) 0.25 MG tablet Take 1 tablet (0.25 mg total) by mouth 2 (two) times daily. Qty: 60 tablet, Refills: 1    !! risperiDONE (RISPERDAL) 0.5 MG tablet Take 1 tablet (0.5 mg total) by mouth at bedtime. Qty: 30 tablet, Refills: 1     !! - Potential duplicate medications found. Please discuss with provider.     Allergies  Allergen Reactions  . Celebrex [Celecoxib] Nausea Only  . Morphine Itching    REACTION: itching  . Quetiapine     REACTION: tardive  dyskinesia  . Varenicline Tartrate     REACTION: unspecified      The results of significant diagnostics from this hospitalization (including imaging, microbiology, ancillary and laboratory) are listed below for reference.    Significant Diagnostic Studies: No results found.  Microbiology: No results found for this or any previous visit (from the past 240 hour(s)).   Labs: Basic Metabolic Panel:  Recent Labs Lab 08/11/14 1313 08/11/14 1705 08/12/14 0530  NA 137  --  144  K 4.6  --  3.9  CL 99  --  111  CO2 24  --  27  GLUCOSE 84  --  79  BUN 10  --  12  CREATININE 0.54 0.57 0.48*  CALCIUM 8.6  --  8.1*   Liver Function Tests:  Recent Labs Lab 08/11/14 1313 08/12/14 0530  AST 13 14  ALT 9 10  ALKPHOS 104 76  BILITOT 0.3 0.4  PROT 6.1 5.1*  ALBUMIN 3.4* 2.4*   No results for input(s): LIPASE, AMYLASE in the last 168 hours. No results for input(s): AMMONIA in the last 168 hours. CBC:  Recent Labs Lab 08/11/14 1314 08/11/14 1705 08/12/14 0530  WBC 11.0* 10.9* 7.0  NEUTROABS  --  7.3  --   HGB 12.3 10.9* 9.9*  HCT 39.3 34.8* 31.5*  MCV 92.6 92.6 92.4  PLT  --  510* 422*   Cardiac Enzymes: No results for input(s): CKTOTAL, CKMB, CKMBINDEX, TROPONINI in the last 168 hours. BNP: BNP (last 3 results) No results for input(s): BNP in the last 8760 hours.  ProBNP (last 3 results) No results for input(s): PROBNP in the last 8760 hours.  CBG: No results for input(s): GLUCAP in the last 168 hours.     Signed:  Charlynne Cousins  [facility] 08/12/2014, 8:21 AM

## 2014-08-14 ENCOUNTER — Encounter: Payer: Self-pay | Admitting: Family Medicine

## 2014-08-16 ENCOUNTER — Ambulatory Visit (INDEPENDENT_AMBULATORY_CARE_PROVIDER_SITE_OTHER): Payer: Medicare Other | Admitting: Family Medicine

## 2014-08-16 VITALS — BP 132/80 | HR 76 | Temp 98.0°F | Resp 18 | Ht 63.0 in | Wt 90.2 lb

## 2014-08-16 DIAGNOSIS — E43 Unspecified severe protein-calorie malnutrition: Secondary | ICD-10-CM

## 2014-08-16 DIAGNOSIS — Z79899 Other long term (current) drug therapy: Secondary | ICD-10-CM

## 2014-08-16 DIAGNOSIS — F332 Major depressive disorder, recurrent severe without psychotic features: Secondary | ICD-10-CM

## 2014-08-16 DIAGNOSIS — R636 Underweight: Secondary | ICD-10-CM | POA: Diagnosis not present

## 2014-08-16 DIAGNOSIS — G894 Chronic pain syndrome: Secondary | ICD-10-CM

## 2014-08-16 DIAGNOSIS — G8929 Other chronic pain: Secondary | ICD-10-CM | POA: Diagnosis not present

## 2014-08-16 DIAGNOSIS — R109 Unspecified abdominal pain: Secondary | ICD-10-CM | POA: Diagnosis not present

## 2014-08-16 DIAGNOSIS — R630 Anorexia: Secondary | ICD-10-CM | POA: Diagnosis not present

## 2014-08-16 MED ORDER — BOOST BREEZE PO LIQD: 8.0000 [oz_av] | Freq: Three times a day (TID) | ORAL | Status: DC

## 2014-08-16 MED ORDER — MIRTAZAPINE 15 MG PO TABS: 15.0000 mg | ORAL_TABLET | Freq: Every day | ORAL | Status: DC

## 2014-08-16 MED ORDER — HYDROCODONE-ACETAMINOPHEN 5-325 MG PO TABS: 1.0000 | ORAL_TABLET | Freq: Two times a day (BID) | ORAL | Status: DC | PRN

## 2014-08-16 MED ORDER — MEGESTROL ACETATE 400 MG/10ML PO SUSP: 800.0000 mg | Freq: Every day | ORAL | Status: DC

## 2014-08-16 NOTE — Progress Notes (Signed)
This chart was scribed for  by Lowella Petties, ED Scribe. The patient was seen in room 8. Patient's care was started at 2:16 PM.  Subjective:   Patient ID: [name], female    DOB: [birth year], 64 y.o.   MRN: [id number]  Chief Complaint  Patient presents with  . Anorexia    states she had an feeding tube and now it is out; she is trying to get her weight back up but she can not eat;   HPI HPI Comments: TAQUILA [name] is a 64 y.o. female who presents to the Emergency Department complaining of anorexia. Per her [place] takes chronic narcotic pain medication after a history of abdominal surgeries for an unknown diagnosis. She had a feeding tube placed and 1 week after this procedure it fell out and she contacted Dr. Hassell Done and was seen by him and then me. I saw her last thursday and she was admitted to the hospital for malnourishment after her feeding tube came out. She was released 1 day later without a correction of her anorexia. About 6 months ago she was hospitalized by her surgeon for 4 day and she had good results from that. She gained 10 lbs that she was able to maintain for an extended period at home. After her feeding tube placement she was only kept in the hospital for 1 night. Her weight has remained unchanged for the past week. She has lived with a roommate, [name], since 2010. It is unclear if they are friends or SOs, but norma is involved in [name] medical care. When she was hospitalized in July, she did have a psychiatric consult with Dr. Adele Schilder who felt that her symptoms were consistent with severe, major, depression. Patient required larges amounts of pian medication and sedatives witout GI diagnosis. Significant history of GI abnormality and opiate dependance. She was hospitalized for detox many years ago. She has taken Seroquel, xanax, and Prozac, but she is unable to remember the affect that these medications had on her. Her GI doctor is Dr. Oletta Lamas. She sees [facility] for her chronic abdominal pain, with history of narcotic dependence and abuse. She sees [name], NP for her anxiety, and Dr. Hassell Done is her surgeon. Long standing history of peptic ulcer disease, severe GERD, and Barrett's esophageus. In 2008 she had a nissen fundoplication. She has also had a truncal vagotomy and truncal vasostomy in addition to jejunal feeding tubes. She has a 90 PPY smoking history. She was started on Remeron and Risperdal. When she was hospitalized 2/18 - 19 she saw nutrition, no note is seen.   Today she states that she does not see anyone for her psychiatrist medications currently, and Dr. Hassell Done has been prescribing her Remeron and Risperdal. She [facility] [address] and liked the psychiatrist there, but her insurance would not cover it. She states that she has not seen [facility] for 6 years, and she states that she has not taken pain medication for years. She expresses desire to return to this to help with her pain and ability to eat. She states that she does not want to die. She has not tried [employer]. She has tried [place] for her appetite without success. She has not seen a GI doctor for many years, and she used to see Dr. Benson Norway. She states that she takes two Prilosec and two Prozac daily. She states that she ran out of Remeron a  while ago, but she is not sure when. She reports trying to eat whatever she can. She reports eating vegetables and hamburger patties. She reports eating a whole egg, grits, and sausage today. However, she expresses concern that she is not gaining wait from this. She denies nausea, vomiting, and diarrhea. She states that she does not remember seeing a nutritionist in the hospital. She states that she started on the Breeze supplements, but reports that these are expensive. She states that she does not go to the bathroom everyday, but her feces is normal.   Past Medical History  Diagnosis Date   . Peptic ulcer disease     dr Oletta Lamas  . Gastric outlet obstruction   . Acute duodenal ulcer with hemorrhage and perforation, with obstruction   . Barrett's esophagus   . Menopause   . GERD (gastroesophageal reflux disease)   . Asthma   . Chronic abdominal pain     narcotic dependence, dr gyarteng-dak at [facility]  . Narcotic abuse   . Anxiety     sees [name] NP at Dr. Radonna Ricker office  . Fx two ribs-open 08-24-11    left 4th and 5th  . Fx of fibula 07-28-11    left fibula, 2 places   . COPD (chronic obstructive pulmonary disease)   . S/P ORIF (open reduction internal fixation) fracture     both hips pinned  . Allergy   . Anemia   . History of blood transfusion   . Lap Nissen + truncal vagotomy July 2008 05/13/2013  . LEG EDEMA 01/19/2008    Qualifier: Diagnosis of  By: [name] MD, [name]   . ACUTE DUODEN ULCER W/HEMORR PERF&OBSTRUCTION 06/26/2004    Qualifier: Diagnosis of  By: [name] MD, [name]   . FEVER, RECURRENT 08/11/2009    Qualifier: Diagnosis of  By: [name] MD, [name] MENOPAUSE 01/07/2007    Qualifier: Diagnosis of  By: [name], CMA, [place]    . Pneumonia, organism unspecified 11/07/2010    Assoc with R Parapneumonic effusion 09/2010    - Tapped 10/05/10    - CxR resolved 11/07/2010    . Gastroparesis 06/03/2007    Qualifier: Diagnosis of  By: [name] MD, [name]   . GASTRIC OUTLET OBSTRUCTION 08/28/2007    In July 2008 she underwent laparoscopic enterolysis, Nissen fundoplication over a #51 bougie, single pledgeted suture colosure of the hiatus and a 3 suture wrap.  Lap truncal vagotomy and a loop gastrojejunostomy was performed.  This was revised in August of 2009 to a roux en Y gastrojejujostomy.     . 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 Complication of anesthesia     pt states had too much xanax on board - agitated   . Shortness of breath dyspnea     with exertion   . Depression   . History of hiatal hernia   . Headache      freq headaches   . Arthritis    Current Outpatient Prescriptions on File Prior to Visit  Medication Sig Dispense Refill  . ALPRAZolam (XANAX) 0.5 MG tablet Take 1 tablet (0.5 mg total) by mouth 3 (three) times daily as needed for anxiety. 30 tablet 0  . aspirin 81 MG tablet Take 81 mg by mouth daily as needed for fever (fever).    Marland Kitchen FLUoxetine (PROZAC) 40 MG capsule Take 80 mg by mouth daily.     Marland Kitchen HYDROcodone-acetaminophen (NORCO/VICODIN) 5-325 MG per  tablet Take 1 tablet by mouth every 6 (six) hours as needed for moderate pain. 10 tablet 0  . mirtazapine (REMERON) 7.5 MG tablet Take 7.5 mg by mouth at bedtime.    . Multiple Vitamin (MULTIVITAMIN WITH MINERALS) TABS tablet Take 1 tablet by mouth daily.    Marland Kitchen omeprazole (PRILOSEC) 40 MG capsule Take 40 mg by mouth 2 (two) times daily.   11  . ondansetron (ZOFRAN ODT) 4 MG disintegrating tablet 4mg  ODT q4 hours prn nausea/vomit 30 tablet 0  . risperiDONE (RISPERDAL) 0.25 MG tablet Take 1 tablet (0.25 mg total) by mouth 2 (two) times daily. 60 tablet 1  . risperiDONE (RISPERDAL) 0.5 MG tablet Take 1 tablet (0.5 mg total) by mouth at bedtime. 30 tablet 1   No current facility-administered medications on file prior to visit.   Allergies  Allergen Reactions  . Celebrex [Celecoxib] Nausea Only  . Morphine Itching    REACTION: itching  . Quetiapine     REACTION: tardive dyskinesia  . Varenicline Tartrate     REACTION: unspecified   Review of Systems  BP 132/80 mmHg  Pulse 76  Temp(Src) 98 F (36.7 C) (Oral)  Resp 18  Ht 5\' 3"  (1.6 m)  Wt 90 lb 3.2 oz (40.914 kg)  BMI 15.98 kg/m2  SpO2 97%  Results for orders placed or performed during the hospital encounter of 08/11/14  CBC  Result Value Ref Range   WBC 10.9 (H) 4.0 - 10.5 K/uL   RBC 3.76 (L) 3.87 - 5.11 MIL/uL   Hemoglobin 10.9 (L) 12.0 - 15.0 g/dL   HCT 34.8 (L) 36.0 - 46.0 %   MCV 92.6 78.0 - 100.0 fL   MCH 29.0 26.0 - 34.0 pg   MCHC 31.3 30.0 - 36.0 g/dL   RDW 15.6 (H)  11.5 - 15.5 %   Platelets 510 (H) 150 - 400 K/uL  Creatinine, serum  Result Value Ref Range   Creatinine, Ser 0.57 0.50 - 1.10 mg/dL   GFR calc non Af Amer >90 >90 mL/min   GFR calc Af Amer >90 >90 mL/min  Sodium, urine, random  Result Value Ref Range   Sodium, Ur 48 mEq/L  Creatinine, urine, random  Result Value Ref Range   Creatinine, Urine 55.0 mg/dL  CBC  Result Value Ref Range   WBC 7.0 4.0 - 10.5 K/uL   RBC 3.41 (L) 3.87 - 5.11 MIL/uL   Hemoglobin 9.9 (L) 12.0 - 15.0 g/dL   HCT 31.5 (L) 36.0 - 46.0 %   MCV 92.4 78.0 - 100.0 fL   MCH 29.0 26.0 - 34.0 pg   MCHC 31.4 30.0 - 36.0 g/dL   RDW 15.9 (H) 11.5 - 15.5 %   Platelets 422 (H) 150 - 400 K/uL  Comprehensive metabolic panel  Result Value Ref Range   Sodium 144 135 - 145 mmol/L   Potassium 3.9 3.5 - 5.1 mmol/L   Chloride 111 96 - 112 mmol/L   CO2 27 19 - 32 mmol/L   Glucose, Bld 79 70 - 99 mg/dL   BUN 12 6 - 23 mg/dL   Creatinine, Ser 0.48 (L) 0.50 - 1.10 mg/dL   Calcium 8.1 (L) 8.4 - 10.5 mg/dL   Total Protein 5.1 (L) 6.0 - 8.3 g/dL   Albumin 2.4 (L) 3.5 - 5.2 g/dL   AST 14 0 - 37 U/L   ALT 10 0 - 35 U/L   Alkaline Phosphatase 76 39 - 117 U/L   Total Bilirubin 0.4  0.3 - 1.2 mg/dL   GFR calc non Af Amer >90 >90 mL/min   GFR calc Af Amer >90 >90 mL/min   Anion gap 6 5 - 15  Differential  Result Value Ref Range   Neutrophils Relative % 68 43 - 77 %   Neutro Abs 7.3 1.7 - 7.7 K/uL   Lymphocytes Relative 21 12 - 46 %   Lymphs Abs 2.3 0.7 - 4.0 K/uL   Monocytes Relative 6 3 - 12 %   Monocytes Absolute 0.7 0.1 - 1.0 K/uL   Eosinophils Relative 3 0 - 5 %   Eosinophils Absolute 0.4 0.0 - 0.7 K/uL   Basophils Relative 2 (H) 0 - 1 %   Basophils Absolute 0.2 (H) 0.0 - 0.1 K/uL    Objective:  Physical Exam  Constitutional: She is oriented to person, place, and time. She appears well-developed and well-nourished. No distress.  Cachectic, emaciated, using walker  HENT:  Head: Normocephalic and atraumatic.  Eyes:  Conjunctivae and EOM are normal.  Neck: Neck supple. No tracheal deviation present.  Cardiovascular: Normal rate.   Pulmonary/Chest: Effort normal. No respiratory distress.  Musculoskeletal: Normal range of motion.  Neurological: She is alert and oriented to person, place, and time.  Skin: Skin is warm and dry.  Psychiatric: Her behavior is normal. Her affect is angry.  Nursing note and vitals reviewed.    Assessment & Plan:   Anorexia  Severe protein-calorie malnutrition  Underweight  Major depressive disorder, recurrent, severe without psychotic features  Polypharmacy  Chronic pain syndrome - Plan: Ambulatory referral to Pain Clinic  Chronic abdominal pain - Plan: Ambulatory referral to Pain Clinic  Meds ordered this encounter  Medications  . Nutritional Supplements (BOOST BREEZE) LIQD    Sig: Take 8 oz by mouth 3 (three) times daily.    Dispense:  90 Can    Refill:  11  . mirtazapine (REMERON) 15 MG tablet    Sig: Take 1 tablet (15 mg total) by mouth at bedtime.    Dispense:  30 tablet    Refill:  1  . megestrol (MEGACE) 400 MG/10ML suspension    Sig: Take 20 mLs (800 mg total) by mouth daily.    Dispense:  480 mL    Refill:  0  . HYDROcodone-acetaminophen (NORCO/VICODIN) 5-325 MG per tablet    Sig: Take 1 tablet by mouth every 12 (twelve) hours as needed for moderate pain.    Dispense:  60 tablet    Refill:  0   Over 40 min spent in face-to-face evaluation of and consultation with patient and coordination of care.  Over 50% of this time was spent counseling this patient.  I personally performed the services described in this documentation, which was scribed in my presence. The recorded information has been reviewed and considered, and addended by me as needed.  [name], MD MPH

## 2014-08-16 NOTE — Patient Instructions (Addendum)
Start a weight, food, and pain dairy.  Your full-time job is to document your symptoms so you can find out what makes them worse and better and why you are loosing weight and have poor nutrition despite adequate intake. Keep a log of your weight fasting every morning, what you eat throughout the day and how much, bowel movements, any vomiting, how your pain is - what makes it worse, what makes it better (especially as far as food types, quantities, etc). Please do the protein supplement drinks at least twice a day. Try the coupons and the prescription Call [facility] as I referred you back there again.  I am concerned that the hydrocodone will decrease your appetite.   Do NOT use any over the counter medication but due consider starting with mint teas or supplements to soothe gut inflammation. Call the [facility] to see if they take your insurance.  If they do not, tell them that you really appreciated their care and ask them if they can refer you to any other similar provider (you need a NP, MD, DO, PA who can prescribe medications.)  [address], [place], [place] [address]  [phone]   Malnutrition Many of [place] think of malnutrition as a condition in which there is not enough to eat. Malnutrition is actually any condition where nutrition is poor. This means:  Too much to eat as we see in conditions of obesity.  Too little to eat with starvation. The following information is only for the malnourished with dietary deficiencies (poor diet). CAUSES  Under-nutrition can result from:  Poor intake.  Malabsorption  Lactation  Bleeding  Diarrhea  Old age.  Kidney failure.  Infancy  Poverty  Infection  Adolescence  Excessive sweating  Drug addiction  Pregnancy  Early childhood. Under-nutrition comes anytime the demand is more than the intake. SYMPTOMS  The problems depend on what type of malnutrition is present. Some general symptoms  include:  Fatigue.  Dizziness.  Fainting  Weight loss.  Poor immune response.  Lack of menstruation.  Lack of growth in children.  Hair loss. DIAGNOSIS  Your caregiver will usually suspect malnutrition based on results of your:   Medical and dietary history.  Physical exam. This will often include measurements of your BMI (body mass index).  Perhaps some blood tests. These may include: plasma levels of nutrients and nutrient-dependent substances, such as:  Hemoglobin  Thyroid hormones  Transferrin  Albumin RISK FACTORS Persons in the following circumstances may be at risk of malnutrition.  Infants and children are at risk of under-nutrition. This is because of their high demand for energy and essential nutrients. Protein-energy malnutrition in children consuming inadequate amounts of protein, calories, and other nutrients is a particularly severe form of under-nutrition that delays growth and development. This includes [place] and [place].  Hemorrhagic disease of the newborn is a life-threatening disorder. This is due to a lack of vitamin K, iron, folic acid, vitamin C, copper, zinc, and vitamin A. This may occur in inadequately fed infants and children.  In adolescence, nutritional requirements increase because they are growing. Anorexia nervosa, a form of starvation, may affect adolescents.  Pregnancy and lactation. Requirements for all nutrients are increased during pregnancy and lactation.  Abnormal diets, such as pica (the consumption of nonnutritive substances, such as clay and charcoal), are common in pregnancy.  Anemia due to folic acid deficiency is common in pregnant women. This is especially true for those who have taken oral contraceptives. Folic  acid supplements are now recommended for pregnant women. Folic acid prevents neural tube defects (spina bifida) in children.  Breast-fed-only infants may develop vitamin B12 deficiency if the mother is a  vegan.  An alcoholic mother may have a handicapped and stunted child with fetal alcohol syndrome. This is due to the effects of alcohol on the fetus. Do not drink during pregnancy.  Old age: A weakened sense of taste and smell, loneliness, physical and mental handicaps, immobility, and chronic illness can hurt the food intake in the elderly. Absorption is reduced. This may add to iron deficiency, calcium and bone problems and also a softening of the bones due to lack of vitamin D. This is also made worse by not being in the sun.  With aging, we loose lean body mass. These changes and a reduction in physical activity result in lower energy and protein requirements compared with those of younger adults.  Chronic disease including malabsorption states (including those resulting from surgery) tend to impair the absorption of fat-soluble vitamins, vitamin B12, calcium, and iron.  Liver disease impairs the storage of vitamins A and B12. It also interferes with the metabolism of protein and energy sources.  Kidney disease may cause deficiencies of protein, iron, and vitamin D.  Cancer and AIDS may cause anorexia. This is a loss of appetite.  Vegetarian diet. The most common form of this type of diet is when meat and fish are not eaten, but eggs and dairy products are eaten. Iron deficiency is the only risk. Ovo-lacto vegetarians tend to live longer and to develop fewer chronic disabling conditions than their meat-eating peers. However, their lifestyle usually includes regular exercise and abstention from alcohol and tobacco. This may contribute to better health. Vegans consume no animal products and are susceptible to vitamin B12 deficiency. Yeast extracts and oriental-style fermented foods provide this vitamin. Intake of calcium, iron, and zinc also tends to be low. A fruitarian diet (eat only fruit) is deficient in protein, salt, and many micronutrients. This is not recommended.  Fad diets: Many  commercial diets are claimed to enhance well-being or reduce weight. A physician should be alert to early evidence of nutrient deficiency or toxicity in patients on these diets. Such diets have resulted in vitamin, mineral, and protein deficiency states and cardiac, renal, and metabolic disorders. Some fad diets have resulted in death. People on very low calorie diets (less than 400 kcal/day) cannot sustain health for long. Some trace mineral supplements have induced toxicity.  Alcohol or drug dependency: Addiction leads to a troubled lifestyle in which adequate nourishment is ignored. Absorption and metabolism of nutrients are impaired. High levels of alcohol are poisonous. Too much alcohol can cause tissue injury, particularly of the GI tract, liver, pancreas, brain, and peripheral nervous system. Beer drinkers who consume food may gain weight, but alcoholics who use more than one quart of hard liquor per day lose weight and become undernourished. Drug addicts are usually very skinny. Alcoholism is the most common cause of thiamine deficiency and may lead to deficiencies of magnesium, zinc, and other vitamins. TREATMENT  Get treatment if you experience changes in how your body is working.  PREVENTION  Eating a good, well-balanced diet helps to prevent most forms of malnutrition. Document Released: 04/26/2005 Document Revised: 09/02/2011 Document Reviewed: 05/18/2007 [facility] Patient Information 2015 [place], [place]. This information is not intended to replace advice given to you by your health care provider. Make sure you discuss any questions you have with your health care provider.

## 2014-08-17 DIAGNOSIS — J449 Chronic obstructive pulmonary disease, unspecified: Secondary | ICD-10-CM | POA: Diagnosis not present

## 2014-08-17 DIAGNOSIS — F419 Anxiety disorder, unspecified: Secondary | ICD-10-CM | POA: Diagnosis not present

## 2014-08-17 DIAGNOSIS — K2270 Barrett's esophagus without dysplasia: Secondary | ICD-10-CM | POA: Diagnosis not present

## 2014-08-17 DIAGNOSIS — F17210 Nicotine dependence, cigarettes, uncomplicated: Secondary | ICD-10-CM | POA: Diagnosis not present

## 2014-08-17 DIAGNOSIS — K279 Peptic ulcer, site unspecified, unspecified as acute or chronic, without hemorrhage or perforation: Secondary | ICD-10-CM | POA: Diagnosis not present

## 2014-08-17 DIAGNOSIS — Z434 Encounter for attention to other artificial openings of digestive tract: Secondary | ICD-10-CM | POA: Diagnosis not present

## 2014-08-17 DIAGNOSIS — K311 Adult hypertrophic pyloric stenosis: Secondary | ICD-10-CM | POA: Diagnosis not present

## 2014-08-17 DIAGNOSIS — J45909 Unspecified asthma, uncomplicated: Secondary | ICD-10-CM | POA: Diagnosis not present

## 2014-08-17 DIAGNOSIS — K266 Chronic or unspecified duodenal ulcer with both hemorrhage and perforation: Secondary | ICD-10-CM | POA: Diagnosis not present

## 2014-08-17 DIAGNOSIS — E46 Unspecified protein-calorie malnutrition: Secondary | ICD-10-CM | POA: Diagnosis not present

## 2014-08-24 DIAGNOSIS — K2270 Barrett's esophagus without dysplasia: Secondary | ICD-10-CM | POA: Diagnosis not present

## 2014-08-24 DIAGNOSIS — F419 Anxiety disorder, unspecified: Secondary | ICD-10-CM | POA: Diagnosis not present

## 2014-08-24 DIAGNOSIS — F17210 Nicotine dependence, cigarettes, uncomplicated: Secondary | ICD-10-CM | POA: Diagnosis not present

## 2014-08-24 DIAGNOSIS — J45909 Unspecified asthma, uncomplicated: Secondary | ICD-10-CM | POA: Diagnosis not present

## 2014-08-24 DIAGNOSIS — Z434 Encounter for attention to other artificial openings of digestive tract: Secondary | ICD-10-CM | POA: Diagnosis not present

## 2014-08-24 DIAGNOSIS — K311 Adult hypertrophic pyloric stenosis: Secondary | ICD-10-CM | POA: Diagnosis not present

## 2014-08-24 DIAGNOSIS — K279 Peptic ulcer, site unspecified, unspecified as acute or chronic, without hemorrhage or perforation: Secondary | ICD-10-CM | POA: Diagnosis not present

## 2014-08-24 DIAGNOSIS — E46 Unspecified protein-calorie malnutrition: Secondary | ICD-10-CM | POA: Diagnosis not present

## 2014-08-24 DIAGNOSIS — K266 Chronic or unspecified duodenal ulcer with both hemorrhage and perforation: Secondary | ICD-10-CM | POA: Diagnosis not present

## 2014-08-24 DIAGNOSIS — J449 Chronic obstructive pulmonary disease, unspecified: Secondary | ICD-10-CM | POA: Diagnosis not present

## 2014-09-06 ENCOUNTER — Observation Stay (HOSPITAL_COMMUNITY)
Admission: AD | Admit: 2014-09-06 | Discharge: 2014-09-07 | Disposition: A | Discharge status: Home / Self Care | Payer: Medicare Other | Source: Ambulatory Visit | Attending: Family Medicine | Admitting: Family Medicine

## 2014-09-06 ENCOUNTER — Encounter (HOSPITAL_COMMUNITY): Payer: Self-pay | Admitting: General Practice

## 2014-09-06 ENCOUNTER — Observation Stay (HOSPITAL_COMMUNITY): Payer: Medicare Other

## 2014-09-06 ENCOUNTER — Ambulatory Visit (INDEPENDENT_AMBULATORY_CARE_PROVIDER_SITE_OTHER): Payer: Medicare Other | Admitting: Family Medicine

## 2014-09-06 VITALS — BP 116/80 | HR 80 | Temp 99.3°F | Resp 16 | Ht 64.0 in | Wt 89.4 lb

## 2014-09-06 DIAGNOSIS — Z903 Acquired absence of stomach [part of]: Secondary | ICD-10-CM | POA: Insufficient documentation

## 2014-09-06 DIAGNOSIS — F329 Major depressive disorder, single episode, unspecified: Secondary | ICD-10-CM | POA: Insufficient documentation

## 2014-09-06 DIAGNOSIS — Z885 Allergy status to narcotic agent status: Secondary | ICD-10-CM | POA: Diagnosis not present

## 2014-09-06 DIAGNOSIS — K279 Peptic ulcer, site unspecified, unspecified as acute or chronic, without hemorrhage or perforation: Secondary | ICD-10-CM | POA: Insufficient documentation

## 2014-09-06 DIAGNOSIS — Z886 Allergy status to analgesic agent status: Secondary | ICD-10-CM | POA: Diagnosis not present

## 2014-09-06 DIAGNOSIS — R109 Unspecified abdominal pain: Secondary | ICD-10-CM | POA: Diagnosis not present

## 2014-09-06 DIAGNOSIS — G8929 Other chronic pain: Secondary | ICD-10-CM

## 2014-09-06 DIAGNOSIS — D649 Anemia, unspecified: Secondary | ICD-10-CM | POA: Insufficient documentation

## 2014-09-06 DIAGNOSIS — K2270 Barrett's esophagus without dysplasia: Secondary | ICD-10-CM | POA: Diagnosis not present

## 2014-09-06 DIAGNOSIS — R634 Abnormal weight loss: Secondary | ICD-10-CM | POA: Insufficient documentation

## 2014-09-06 DIAGNOSIS — R0602 Shortness of breath: Secondary | ICD-10-CM | POA: Diagnosis not present

## 2014-09-06 DIAGNOSIS — D75839 Thrombocytosis, unspecified: Secondary | ICD-10-CM

## 2014-09-06 DIAGNOSIS — J449 Chronic obstructive pulmonary disease, unspecified: Secondary | ICD-10-CM

## 2014-09-06 DIAGNOSIS — D473 Essential (hemorrhagic) thrombocythemia: Secondary | ICD-10-CM | POA: Diagnosis not present

## 2014-09-06 DIAGNOSIS — E43 Unspecified severe protein-calorie malnutrition: Secondary | ICD-10-CM | POA: Diagnosis not present

## 2014-09-06 DIAGNOSIS — Z888 Allergy status to other drugs, medicaments and biological substances status: Secondary | ICD-10-CM | POA: Diagnosis not present

## 2014-09-06 DIAGNOSIS — F17210 Nicotine dependence, cigarettes, uncomplicated: Secondary | ICD-10-CM | POA: Diagnosis not present

## 2014-09-06 DIAGNOSIS — F411 Generalized anxiety disorder: Secondary | ICD-10-CM | POA: Diagnosis not present

## 2014-09-06 DIAGNOSIS — Z09 Encounter for follow-up examination after completed treatment for conditions other than malignant neoplasm: Secondary | ICD-10-CM

## 2014-09-06 DIAGNOSIS — R1013 Epigastric pain: Secondary | ICD-10-CM | POA: Diagnosis not present

## 2014-09-06 DIAGNOSIS — F419 Anxiety disorder, unspecified: Secondary | ICD-10-CM | POA: Insufficient documentation

## 2014-09-06 DIAGNOSIS — F331 Major depressive disorder, recurrent, moderate: Secondary | ICD-10-CM

## 2014-09-06 DIAGNOSIS — Z7982 Long term (current) use of aspirin: Secondary | ICD-10-CM | POA: Insufficient documentation

## 2014-09-06 DIAGNOSIS — Z8659 Personal history of other mental and behavioral disorders: Secondary | ICD-10-CM

## 2014-09-06 DIAGNOSIS — J45909 Unspecified asthma, uncomplicated: Secondary | ICD-10-CM | POA: Diagnosis not present

## 2014-09-06 DIAGNOSIS — Z720 Tobacco use: Secondary | ICD-10-CM | POA: Insufficient documentation

## 2014-09-06 DIAGNOSIS — Z9889 Other specified postprocedural states: Secondary | ICD-10-CM

## 2014-09-06 DIAGNOSIS — K219 Gastro-esophageal reflux disease without esophagitis: Secondary | ICD-10-CM | POA: Diagnosis not present

## 2014-09-06 DIAGNOSIS — Z79899 Other long term (current) drug therapy: Secondary | ICD-10-CM | POA: Diagnosis not present

## 2014-09-06 HISTORY — DX: Shortness of breath: R06.02

## 2014-09-06 LAB — RETICULOCYTES
RBC.: 4.02 MIL/uL (ref 3.87–5.11)
Retic Count, Absolute: 64.3 10*3/uL (ref 19.0–186.0)
Retic Ct Pct: 1.6 % (ref 0.4–3.1)

## 2014-09-06 MED ORDER — FLUOXETINE HCL 20 MG PO CAPS
80.0000 mg | ORAL_CAPSULE | Freq: Every day | ORAL | Status: DC
  Administered 2014-09-07: 80 mg via ORAL
  Filled 2014-09-06: qty 4

## 2014-09-06 MED ORDER — HYDROCODONE-ACETAMINOPHEN 5-325 MG PO TABS
1.0000 | ORAL_TABLET | Freq: Once | ORAL | Status: AC
  Administered 2014-09-06: 1 via ORAL
  Filled 2014-09-06: qty 1

## 2014-09-06 MED ORDER — NICOTINE 21 MG/24HR TD PT24
21.0000 mg | MEDICATED_PATCH | Freq: Every day | TRANSDERMAL | Status: DC
  Administered 2014-09-06 – 2014-09-07 (×2): 21 mg via TRANSDERMAL
  Filled 2014-09-06 (×2): qty 1

## 2014-09-06 MED ORDER — SODIUM CHLORIDE 0.9 % IV SOLN: 250.0000 mL | INTRAVENOUS | Status: DC | PRN

## 2014-09-06 MED ORDER — ONDANSETRON 4 MG PO TBDP: ORAL_TABLET | ORAL | Status: DC

## 2014-09-06 MED ORDER — ONDANSETRON HCL 4 MG PO TABS: 4.0000 mg | ORAL_TABLET | Freq: Four times a day (QID) | ORAL | Status: DC | PRN

## 2014-09-06 MED ORDER — SODIUM CHLORIDE 0.9 % IJ SOLN
3.0000 mL | Freq: Two times a day (BID) | INTRAMUSCULAR | Status: DC
  Administered 2014-09-06 – 2014-09-07 (×2): 3 mL via INTRAVENOUS

## 2014-09-06 MED ORDER — ACETAMINOPHEN 650 MG RE SUPP: 650.0000 mg | Freq: Four times a day (QID) | RECTAL | Status: DC | PRN

## 2014-09-06 MED ORDER — ENOXAPARIN SODIUM 30 MG/0.3ML ~~LOC~~ SOLN
20.0000 mg | SUBCUTANEOUS | Status: DC
Start: 2014-09-06 — End: 2014-09-06
  Filled 2014-09-06: qty 0.2

## 2014-09-06 MED ORDER — ALPRAZOLAM 0.5 MG PO TABS
0.5000 mg | ORAL_TABLET | Freq: Two times a day (BID) | ORAL | Status: DC | PRN
  Administered 2014-09-06: 0.5 mg via ORAL
  Filled 2014-09-06: qty 1

## 2014-09-06 MED ORDER — HYDROCODONE-ACETAMINOPHEN 5-325 MG PO TABS
1.0000 | ORAL_TABLET | Freq: Four times a day (QID) | ORAL | Status: DC | PRN
  Administered 2014-09-07: 1 via ORAL
  Filled 2014-09-06: qty 1

## 2014-09-06 MED ORDER — RISPERIDONE 0.5 MG PO TABS
0.5000 mg | ORAL_TABLET | Freq: Every day | ORAL | Status: DC
  Administered 2014-09-06: 0.5 mg via ORAL
  Filled 2014-09-06 (×2): qty 1

## 2014-09-06 MED ORDER — ENOXAPARIN SODIUM 40 MG/0.4ML ~~LOC~~ SOLN: 40.0000 mg | SUBCUTANEOUS | Status: DC

## 2014-09-06 MED ORDER — SODIUM CHLORIDE 0.9 % IJ SOLN: 3.0000 mL | INTRAMUSCULAR | Status: DC | PRN

## 2014-09-06 MED ORDER — HYDROCODONE-ACETAMINOPHEN 5-325 MG PO TABS
1.0000 | ORAL_TABLET | Freq: Two times a day (BID) | ORAL | Status: DC | PRN
  Administered 2014-09-06: 1 via ORAL
  Filled 2014-09-06: qty 1

## 2014-09-06 MED ORDER — ACETAMINOPHEN 325 MG PO TABS: 650.0000 mg | ORAL_TABLET | Freq: Four times a day (QID) | ORAL | Status: DC | PRN

## 2014-09-06 MED ORDER — ONDANSETRON HCL 4 MG/2ML IJ SOLN
4.0000 mg | Freq: Four times a day (QID) | INTRAMUSCULAR | Status: DC | PRN
  Administered 2014-09-07: 4 mg via INTRAVENOUS
  Filled 2014-09-06: qty 2

## 2014-09-06 MED ORDER — CETYLPYRIDINIUM CHLORIDE 0.05 % MT LIQD
7.0000 mL | Freq: Two times a day (BID) | OROMUCOSAL | Status: DC
  Administered 2014-09-06 – 2014-09-07 (×2): 7 mL via OROMUCOSAL

## 2014-09-06 MED ORDER — PANTOPRAZOLE SODIUM 40 MG PO TBEC
40.0000 mg | DELAYED_RELEASE_TABLET | Freq: Two times a day (BID) | ORAL | Status: DC
  Administered 2014-09-06 – 2014-09-07 (×2): 40 mg via ORAL
  Filled 2014-09-06 (×2): qty 1

## 2014-09-06 MED ORDER — MIRTAZAPINE 15 MG PO TABS
15.0000 mg | ORAL_TABLET | Freq: Every day | ORAL | Status: DC
  Administered 2014-09-06: 15 mg via ORAL
  Filled 2014-09-06 (×2): qty 1

## 2014-09-06 MED ORDER — ENOXAPARIN SODIUM 40 MG/0.4ML ~~LOC~~ SOLN
40.0000 mg | SUBCUTANEOUS | Status: DC
  Administered 2014-09-06: 40 mg via SUBCUTANEOUS
  Filled 2014-09-06: qty 0.4

## 2014-09-06 NOTE — H&P (Signed)
[facility] Admission History and Physical Service Pager: [phone]  Patient name: [name] Medical record number: [id number] Date of birth: 06/01/1951 Age: 64 y.o. Gender: female  Primary Care Provider: [name], MD Consultants: Nutrition Code Status: FULL  Chief Complaint: Weight loss  Assessment and Plan: [name] is a 64 y.o. female presenting with severe protein calorie malnutrition. PMH is significant for tardive dyskinesia, lung nodule, Barrett's esophagitis, COPD, chronic abdominal pain, orthostatic hypotension, anxiety, MDD, S/P Roux-en-Y and Nissen procedure.  #Protein calorie malnutrition, severe: Patient has been experiencing unintentional weight loss over the course of years. She was directly admitted from her PCP for workup. Eat 3 meals a day with calorie supplements. Continues to lose weight. History of eating disorder. History of abdominal surgeries with diminished gastric volume. Tobacco abuse. Differential includes eating disorder versus neoplasm versus physiological disruption secondary to abdominal surgeries. - Observation, medicine teaching service, attending Dr. Ree Kida - Consultation to nutrition - Regular diet - EKG - Obtain labs: CBC, CMP, magnesium, phosphorus, prealbumin, TSH - Consider general surgery consultation - Continue home mirtazapine 15 mg @ bedtime  #COPD: Patient complains of mild dyspnea on exertion, no cough at present. No shortness of breath. O2 saturations within normal limits. - Chest x-ray - Continue to monitor vitals - Consider starting Spiriva or inhaled corticosteroid  #Barrett's esophagus - Pantoprazole 40 mg twice a day  #Normocytic anemia: Hemoglobin 10.9 (2/18), MCV 92.6 - Anemia panel  #Abdominal pain, chronic - Norco 5/325mg  Q12H  #Anxiety/MDD/psych - Continue home Xanax 0.5 mg twice a day when necessary - Continue home Prozac 80 mg daily - Continue home risperidone 0.5 mg daily at  bedtime  #Tobacco abuse - Nicotine patch 21 mg daily   FEN/GI: KVO; normal diet Prophylaxis: Lovenox  Disposition: Home when medically stable  History of Present Illness: [name] is a 64 y.o. female presenting with unintentional weight loss over the course of years. Patient was seen by her PCP earlier this afternoon due to her weight loss which is been worsening recently. Patient states that she eats 3 small meals a day with 3 additional breeze supplements but continues to lose weight. She states she is nauseated 24 hours a day this is not changed in severity recently. She recently had a feeding tube placed by general surgery which subsequently fell out approximately one month ago. Patient complains of some dyspnea on exertion weakness and fatigue. She denies any cough night sweats vomiting or diarrhea. Patient also has chronic belly pain from multiple abdominal surgeries she is experienced in the past. Patient appeared extremely anxious throughout our exam. Patient endorses smoking at least 2 packs of cigarellos daily. Patient also reports using a heating pad for her abdominal pain on a daily basis. She reports having "burns" on her belly from this use of daily heating pads for approximately 20 years.  Patient was directly admitted from her primary care provider's office for a workup for adult failure to thrive. Patient is a long history of multiple abdominal surgeries. She states that about 18 years ago she had a perforated ulcer which required surgical intervention. Patient reports that she has had a Nissen and Roux-en-Y procedure. Patient states that the perforated ulcer she experienced was likely due to her "crash diets". Diving further into patient's history appears that she likely had an eating disorder, anorexia with possible bulimic tendencies using laxatives and exercise. She states that she was around 140-160 pounds most of her life; "overweight". Patient has never  seen a  nutritionist.  Patient currently lives with a roommate which she has had the past 5 years. She is currently divorced. Patient states that her current power of attorney is her mother who is in a nursing home and that she feels that it might be time to have this changed to the roommate she currently resides with.  Patient was admitted for observation under the care of the found medicine teaching service.   Review Of Systems: Per HPI  Otherwise 12 point review of systems was performed and was unremarkable.  Patient Active Problem List   Diagnosis Date Noted  . Orthostatic hypotension 08/11/2014  . Malnutrition 07/27/2014  . S/P jejunostomy Feb 2016 07/26/2014  . Severe protein-calorie malnutrition 12/30/2013  . Constipation, chronic 07/10/2013  . Chronic abdominal pain   . COPD (chronic obstructive pulmonary disease) 11/07/2010  . Major depressive disorder, recurrent episode, moderate 06/06/2010  . LUNG NODULE 03/15/2010  . 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 CIGARETTE SMOKER 08/24/2007  . Anxiety state 01/07/2007  . BARRETT'S ESOPHAGUS, HX OF 07/03/2005   Past Medical History: Past Medical History  Diagnosis Date  . Peptic ulcer disease     dr Oletta Lamas  . Gastric outlet obstruction   . Acute duodenal ulcer with hemorrhage and perforation, with obstruction   . Barrett's esophagus   . Menopause   . GERD (gastroesophageal reflux disease)   . Asthma   . Chronic abdominal pain     narcotic dependence, dr gyarteng-dak at [facility]  . Narcotic abuse   . Anxiety     sees [name] NP at Dr. Radonna Ricker office  . Fx two ribs-open 08-24-11    left 4th and 5th  . Fx of fibula 07-28-11    left fibula, 2 places   . COPD (chronic obstructive pulmonary disease)   . S/P ORIF (open reduction internal fixation) fracture     both hips pinned  . Allergy   . Anemia   . History of blood transfusion   . Lap Nissen + truncal vagotomy July 2008 05/13/2013  . LEG EDEMA 01/19/2008     Qualifier: Diagnosis of  By: [name] MD, [name]   . ACUTE DUODEN ULCER W/HEMORR PERF&OBSTRUCTION 06/26/2004    Qualifier: Diagnosis of  By: [name] MD, [name]   . FEVER, RECURRENT 08/11/2009    Qualifier: Diagnosis of  By: [name] MD, [name] MENOPAUSE 01/07/2007    Qualifier: Diagnosis of  By: [name], CMA, [place]    . Pneumonia, organism unspecified 11/07/2010    Assoc with R Parapneumonic effusion 09/2010    - Tapped 10/05/10    - CxR resolved 11/07/2010    . Gastroparesis 06/03/2007    Qualifier: Diagnosis of  By: [name] MD, [name]   . GASTRIC OUTLET OBSTRUCTION 08/28/2007    In July 2008 she underwent laparoscopic enterolysis, Nissen fundoplication over a #78 bougie, single pledgeted suture colosure of the hiatus and a 3 suture wrap.  Lap truncal vagotomy and a loop gastrojejunostomy was performed.  This was revised in August of 2009 to a roux en Y gastrojejujostomy.     . 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 Complication of anesthesia     pt states had too much xanax on board - agitated   . Shortness of breath dyspnea     with exertion   . Depression   . History of hiatal hernia   . Headache     freq headaches   .  Arthritis    Past Surgical History: Past Surgical History  Procedure Laterality Date  . Esophagogastroduodenoscopy  12-29-09    dr qadeer at [facility], several gastric ulcers  . Vagotomy      lapaproscopic - nissan  . 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 Hernia repair    . Perforated ulcer    . Biopsy thyroid  08-13-11    benign nodule, per Dr. Melida Quitter   . Esophagogastroduodenoscopy N/A 09/25/2012    Procedure: ESOPHAGOGASTRODUODENOSCOPY (EGD);  Surgeon: Beryle Beams, MD;  Location: [facility];  Service: Endoscopy;  Laterality: N/A;  . Fracture surgery Bilateral     hips with screw  . Tonsillectomy    . Eye surgery Bilateral     cataract extraction with IOL  . Laparoscopic partial gastrectomy N/A 06/29/2013    Procedure:  Gastrectomy and GJ Tube placement;  Surgeon: Pedro Earls, MD;  Location: [facility];  Service: General;  Laterality: N/A;  . Colonscopy     . Gastrostomy N/A 07/26/2014    Procedure: LAPRASCOPIC ASSISTED OPEN PLACEMENT OF JEJUNOSTOMY TUBE;  Surgeon: Pedro Earls, MD;  Location: [facility];  Service: General;  Laterality: N/A;   Social History: History  Substance Use Topics  . Smoking status: Current Every Day Smoker -- 2.00 packs/day for 45 years    Types: Cigarettes  . Smokeless tobacco: Never Used  . Alcohol Use: No   Additional social history: per HPI  Please also refer to relevant sections of EMR.  Family History: Family History  Problem Relation Age of Onset  . Cancer Mother     kidney, magliant tumor on face  . Stroke Mother   . Celiac disease Father   . Cancer Father     kidney and pancreatic   Allergies and Medications: Allergies  Allergen Reactions  . Celebrex [Celecoxib] Nausea Only  . Morphine Itching    REACTION: itching  . Quetiapine     REACTION: tardive dyskinesia  . Varenicline Tartrate     REACTION: unspecified   No current facility-administered medications on file prior to encounter.   Current Outpatient Prescriptions on File Prior to Encounter  Medication Sig Dispense Refill  . aspirin 81 MG tablet Take 81 mg by mouth daily as needed for fever (fever).    Marland Kitchen FLUoxetine (PROZAC) 40 MG capsule Take 80 mg by mouth daily.     Marland Kitchen HYDROcodone-acetaminophen (NORCO/VICODIN) 5-325 MG per tablet Take 1 tablet by mouth every 12 (twelve) hours as needed for moderate pain. (Patient not taking: Reported on 09/06/2014) 60 tablet 0  . mirtazapine (REMERON) 15 MG tablet Take 1 tablet (15 mg total) by mouth at bedtime. 30 tablet 1  . Multiple Vitamin (MULTIVITAMIN WITH MINERALS) TABS tablet Take 1 tablet by mouth daily.    . Nutritional Supplements (BOOST BREEZE) LIQD Take 8 oz by mouth 3 (three) times daily. 90 Can 11  . omeprazole (PRILOSEC) 40 MG capsule Take 40 mg by  mouth 2 (two) times daily.   11  . ondansetron (ZOFRAN ODT) 4 MG disintegrating tablet 4mg  ODT q4 hours prn nausea/vomit 30 tablet 0  . risperiDONE (RISPERDAL) 0.25 MG tablet Take 1 tablet (0.25 mg total) by mouth 2 (two) times daily. 60 tablet 1  . risperiDONE (RISPERDAL) 0.5 MG tablet Take 1 tablet (0.5 mg total) by mouth at bedtime. (Patient not taking: Reported on 09/06/2014) 30 tablet 1    Objective: BP 117/95 mmHg  Pulse 82  Temp(Src) 99.1  F (37.3 C) (Oral)  Resp 18  Wt 87 lb 6.4 oz (39.644 kg)  SpO2 100% Exam: General -- oriented x3, pleasant and cooperative. Cachectic in appearance HEENT -- Head is normocephalic. EOMI. dentition poor. Integument -- intact. Mottled appearance to abdominal skin. Chest -- good expansion. Lungs clear to auscultation. Cardiac -- RRR. No murmurs noted.  Abdomen -- soft, mild epigastric tenderness. No masses palpable. Bowel sounds present. Healed surgical scarring. CNS -- cranial nerves II through XII grossly intact. Extremeties - no tenderness or effusions noted. ROM good. Dorsalis pedis pulses present and symmetrical.   Labs and Imaging: CBC BMET  No results for input(s): WBC, HGB, HCT, PLT in the last 168 hours. No results for input(s): NA, K, CL, CO2, BUN, CREATININE, GLUCOSE, CALCIUM in the last 168 hours.     Elberta Leatherwood, MD 09/06/2014, 6:44 PM PGY-1 [facility] pager: [phone], text pages welcome  I have seen and evaluated the above patient.  I agree with the note. Addendum in blue.  [facility] PGY-3 Pager: [phone]

## 2014-09-06 NOTE — Progress Notes (Addendum)
Subjective:    Patient ID: [name], female    DOB: 1951-06-06, 64 y.o.   MRN: [id number] This chart was scribed for [name], MD by Zola Button, Medical Scribe. This patient was seen in Room 9 and the patient's care was started at 3:16 PM.   Chief Complaint  Patient presents with  . Malnutrition     HPI HPI Comments: [name] is a 64 y.o. female with a hx of depression who presents to the [facility] for a follow-up for malnutrition.  She has an extensive surgical GI history, including a roux-en-y gastric bypass in 2009 by Dr. Hassell Done. History of severe peptic ulcer disease, Barrett's esophagus treated with nissen fundeoplication initially and then had a partial gastrectomy and a vagotomy more recently.   Patient has had chronic abdominal pain dependent on narcotics, but has been recently beeen largely off narcotics for over 6 months until I rx'ed her #60 of hydrocodone 3 wks prior which helped but she is now out of.  She did contact [facility] and has an initial intake appt sched in 6 wks.   Malnutrition: When I last saw [name] 3 weeks ago, we started her on Megace qam as well as Remeron 15 qhs, but she notes that her appetite has not changed though she has been tolerating this medications well. Her weight has not changed since I last saw her 3 weeks ago. She has been eating several high caloric meals a day prepared or purchased by her roommate and drinking her protein shakes 3x/d. For breakfast today, she had 2 eggs, sausage, coffee and a Breeze supplement.   She has been sleeping well, but she notes that it is difficult to fall asleep.  She states that her urination and bowel movements have been regular and normal; her stools are regular size.  Patient has still been having nausea and she has been having to take Zofran every day for it.  She has been feeling tired and generally weak as well as often feeling lightheadedness and near-syncope.  She  states she feels more or less the same overall since her last visit when we started her on remeron, megace, and hydrocodone.   Patient wants to be admitted to the hospital so that her caloric intake can be evaluated and hopefully lead to clues about her continued severe malnutrition. Pt is really worried she is going to die - waste away.  Worried she has some undetected cancer killing her - feels that there no other way to explain the lack of response to her high caloric intake - has not had any preventative medical care or routine screening as always presents in crisis with abd pains.   She has not seen a psychiatrist in years. She has not talked to Dr. Hassell Done - her surgeon in a while and does not have a f/u appt w/ him as understands that there were no other surgical interventions/options since the jejunostomy tube fell out before it could be used. Patient does not have a follow-up appointment with him yet.   Past Medical History  Diagnosis Date  . Peptic ulcer disease     dr Oletta Lamas  . Gastric outlet obstruction   . Acute duodenal ulcer with hemorrhage and perforation, with obstruction   . Barrett's esophagus   . Menopause   . GERD (gastroesophageal reflux disease)   . Asthma   . Chronic abdominal pain     narcotic dependence, dr gyarteng-dak at  [facility]  . Narcotic abuse   . Anxiety     sees [name] NP at Dr. Radonna Ricker office  . Fx two ribs-open 08-24-11    left 4th and 5th  . Fx of fibula 07-28-11    left fibula, 2 places   . COPD (chronic obstructive pulmonary disease)   . S/P ORIF (open reduction internal fixation) fracture     both hips pinned  . Allergy   . Anemia   . History of blood transfusion   . Lap Nissen + truncal vagotomy July 2008 05/13/2013  . LEG EDEMA 01/19/2008    Qualifier: Diagnosis of  By: [name] MD, [name]   . ACUTE DUODEN ULCER W/HEMORR PERF&OBSTRUCTION 06/26/2004    Qualifier: Diagnosis of  By: [name] MD, [name]   . FEVER, RECURRENT 08/11/2009      Qualifier: Diagnosis of  By: [name] MD, [name] MENOPAUSE 01/07/2007    Qualifier: Diagnosis of  By: [name], CMA, [place]    . Pneumonia, organism unspecified 11/07/2010    Assoc with R Parapneumonic effusion 09/2010    - Tapped 10/05/10    - CxR resolved 11/07/2010    . Gastroparesis 06/03/2007    Qualifier: Diagnosis of  By: [name] MD, [name]   . GASTRIC OUTLET OBSTRUCTION 08/28/2007    In July 2008 she underwent laparoscopic enterolysis, Nissen fundoplication over a #05 bougie, single pledgeted suture colosure of the hiatus and a 3 suture wrap.  Lap truncal vagotomy and a loop gastrojejunostomy was performed.  This was revised in August of 2009 to a roux en Y gastrojejujostomy.     . 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 Complication of anesthesia     pt states had too much xanax on board - agitated   . Shortness of breath dyspnea     with exertion   . Depression   . History of hiatal hernia   . Headache     freq headaches   . Arthritis    Current Outpatient Prescriptions on File Prior to Visit  Medication Sig Dispense Refill  . aspirin 81 MG tablet Take 81 mg by mouth daily as needed for fever (fever).    Marland Kitchen FLUoxetine (PROZAC) 40 MG capsule Take 80 mg by mouth daily.     . megestrol (MEGACE) 400 MG/10ML suspension Take 20 mLs (800 mg total) by mouth daily. 480 mL 0  . mirtazapine (REMERON) 15 MG tablet Take 1 tablet (15 mg total) by mouth at bedtime. 30 tablet 1  . Multiple Vitamin (MULTIVITAMIN WITH MINERALS) TABS tablet Take 1 tablet by mouth daily.    . Nutritional Supplements (BOOST BREEZE) LIQD Take 8 oz by mouth 3 (three) times daily. 90 Can 11  . omeprazole (PRILOSEC) 40 MG capsule Take 40 mg by mouth 2 (two) times daily.   11  . ondansetron (ZOFRAN ODT) 4 MG disintegrating tablet 4mg  ODT q4 hours prn nausea/vomit 30 tablet 0  . risperiDONE (RISPERDAL) 0.25 MG tablet Take 1 tablet (0.25 mg total) by mouth 2 (two) times daily. 60 tablet 1  .  HYDROcodone-acetaminophen (NORCO/VICODIN) 5-325 MG per tablet Take 1 tablet by mouth every 12 (twelve) hours as needed for moderate pain. (Patient not taking: Reported on 09/06/2014) 60 tablet 0  . risperiDONE (RISPERDAL) 0.5 MG tablet Take 1 tablet (0.5 mg total) by mouth at bedtime. (Patient not taking: Reported on 09/06/2014) 30 tablet 1   No current facility-administered medications on  file prior to visit.   Allergies  Allergen Reactions  . Celebrex [Celecoxib] Nausea Only  . Morphine Itching    REACTION: itching  . Quetiapine     REACTION: tardive dyskinesia  . Varenicline Tartrate     REACTION: unspecified    Past Surgical History  Procedure Laterality Date  . Esophagogastroduodenoscopy  12-29-09    dr qadeer at [facility], several gastric ulcers  . Vagotomy      lapaproscopic - nissan  . 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 Hernia repair    . Perforated ulcer    . Biopsy thyroid  08-13-11    benign nodule, per Dr. Melida Quitter   . Esophagogastroduodenoscopy N/A 09/25/2012    Procedure: ESOPHAGOGASTRODUODENOSCOPY (EGD);  Surgeon: Beryle Beams, MD;  Location: [facility];  Service: Endoscopy;  Laterality: N/A;  . Fracture surgery Bilateral     hips with screw  . Tonsillectomy    . Eye surgery Bilateral     cataract extraction with IOL  . Laparoscopic partial gastrectomy N/A 06/29/2013    Procedure: Gastrectomy and GJ Tube placement;  Surgeon: Pedro Earls, MD;  Location: [facility];  Service: General;  Laterality: N/A;  . Colonscopy     . Gastrostomy N/A 07/26/2014    Procedure: LAPRASCOPIC ASSISTED OPEN PLACEMENT OF JEJUNOSTOMY TUBE;  Surgeon: Pedro Earls, MD;  Location: [facility];  Service: General;  Laterality: N/A;    Review of Systems  Constitutional: Positive for diaphoresis, fatigue and unexpected weight change. Negative for fever, activity change and appetite change.  Gastrointestinal: Positive for nausea and abdominal pain. Negative for constipation and  abdominal distention.  Musculoskeletal: Positive for back pain and arthralgias. Negative for myalgias.  Skin: Positive for pallor. Negative for rash.  Neurological: Positive for weakness and light-headedness. Negative for dizziness and syncope.  Psychiatric/Behavioral: Positive for sleep disturbance and dysphoric mood. Negative for suicidal ideas, self-injury and agitation. The patient is nervous/anxious. The patient is not hyperactive.        Objective:  BP 116/80 mmHg  Pulse 80  Temp(Src) 99.3 F (37.4 C) (Oral)  Resp 16  Ht $R'5\' 4"'XJ$  (1.626 m)  Wt 89 lb 6.4 oz (40.552 kg)  BMI 15.34 kg/m2  SpO2 96%  Physical Exam  Constitutional: She is oriented to person, place, and time. She appears cachectic. No distress.  HENT:  Head: Normocephalic and atraumatic.  Mouth/Throat: Oropharynx is clear and moist. No oropharyngeal exudate.  Eyes: Pupils are equal, round, and reactive to light.  Neck: Neck supple.  Cardiovascular: Normal rate, regular rhythm and normal heart sounds.   Pulmonary/Chest: Effort normal and breath sounds normal. No respiratory distress. She has no wheezes. She has no rales.  Musculoskeletal: She exhibits no edema.  Neurological: She is alert and oriented to person, place, and time. No cranial nerve deficit.  Skin: Skin is warm and dry. No rash noted.  Psychiatric: She has a normal mood and affect. Her behavior is normal.  Nursing note and vitals reviewed.         Assessment & Plan:  Severe protein-calorie malnutrition - pt's weight is unchanged despite 3-4 high caloric meals daily and 3 protein supp drinks daily that she has started since our last visit 3 wks prior - this intake is independently confirmed by pt's friend/roommate [name] (also a pt of mine - goes by [employer]).  However, pt's report of her caloric intake and expenditure do not correlate with her chronic cachetic appearance  and labs reflecting severe malnutrition.    Pt has not had adequate cancer  screening or preventative health care. I just met pt within the past few months where she has been in "crisis" mode due to weightloss, malnutrition, and severe depression.  Unfortunately she lost her jejunostomy tube before it could be used and no other surgical options are open to her.  I called the [facility] and spoke with the upper level resident who was gracious enough to accept pt onto their service by direct admit.   Pt definitely needs nutrition eval to help assess the difference between pt's actual and reported caloric intake as well as caloric need.   Pt does need chest CT scan and may need surgery and/or GI eval due to her prior history. Pt has not had any colon cancer screening that I am aware of - might be a good candidate for CT colonography due to surgical history. Needs mammogram (pt denies any breast concerns). Needs pap and dexa. Needs zostavax, flu shot, and tdap.  Stop megace - clearly did not help at all. Pt also has not had any positive response to remeron but just started 3 wks ago so cont for now   Lap Nissen + truncal vagotomy July 2008 - followed by Dr. Hassell Done - [facility]  - does not have GI physican in town - rev seen at [facility]  Chronic obstructive pulmonary disease, unspecified COPD, unspecified chronic bronchitis type - pt has switched to vapor - trying to quit - needs chest CT scan.  Major depressive disorder, recurrent episode, moderate - meds have been prescribed by her surgeon Dr. Hassell Done for years - pt has been on prozac 80g for 30-40 yrs - I am reluctant to increase mirtazipine further without weaning down/off prozac 30m - recommend psych eval for medication management due to extensive history - consider increasing risperdal as can cause weight gain but pt has reported some tardive dyskenesia w/ some other antipsychotics.  Chronic abdominal pain - Has appt w/ [facility] to re-establish in late April - I rx'ed pt  #60 norco 3 wks prev which she used about twice as freq as was rx'ed so reluctant to continue rxing to her.  There is no doubt that she has numerous causes for chronic pain from multiple large GI surgeries, bilateral hip ORIF, and spinal surgery and so will likely need to be on narcotics for adequate pain control but I am concerned about the possibility of appetite suppression with narcotics.  Anemia/thrombocytosis - needs peripheral smear, FOBT, ferritin, retics - certainly combined with pt's severe weightloss concerning for possibility of GI cancer or other -   Over 40 min spent in face-to-face evaluation of and consultation with patient and coordination of care.  Over 50% of this time was spent counseling this patient.   I personally performed the services described in this documentation, which was scribed in my presence. The recorded information has been reviewed and considered, and addended by me as needed.  EDelman Cheadle MD MPH

## 2014-09-07 DIAGNOSIS — R1013 Epigastric pain: Secondary | ICD-10-CM

## 2014-09-07 DIAGNOSIS — E46 Unspecified protein-calorie malnutrition: Secondary | ICD-10-CM

## 2014-09-07 DIAGNOSIS — R634 Abnormal weight loss: Secondary | ICD-10-CM | POA: Diagnosis not present

## 2014-09-07 DIAGNOSIS — F17210 Nicotine dependence, cigarettes, uncomplicated: Secondary | ICD-10-CM | POA: Diagnosis not present

## 2014-09-07 DIAGNOSIS — Z888 Allergy status to other drugs, medicaments and biological substances status: Secondary | ICD-10-CM | POA: Diagnosis not present

## 2014-09-07 DIAGNOSIS — Z9884 Bariatric surgery status: Secondary | ICD-10-CM

## 2014-09-07 DIAGNOSIS — R109 Unspecified abdominal pain: Secondary | ICD-10-CM | POA: Diagnosis not present

## 2014-09-07 DIAGNOSIS — F419 Anxiety disorder, unspecified: Secondary | ICD-10-CM | POA: Diagnosis not present

## 2014-09-07 DIAGNOSIS — Z79899 Other long term (current) drug therapy: Secondary | ICD-10-CM | POA: Diagnosis not present

## 2014-09-07 DIAGNOSIS — D649 Anemia, unspecified: Secondary | ICD-10-CM | POA: Diagnosis not present

## 2014-09-07 DIAGNOSIS — Z720 Tobacco use: Secondary | ICD-10-CM | POA: Insufficient documentation

## 2014-09-07 DIAGNOSIS — Z885 Allergy status to narcotic agent status: Secondary | ICD-10-CM | POA: Diagnosis not present

## 2014-09-07 DIAGNOSIS — E43 Unspecified severe protein-calorie malnutrition: Secondary | ICD-10-CM | POA: Diagnosis not present

## 2014-09-07 DIAGNOSIS — J449 Chronic obstructive pulmonary disease, unspecified: Secondary | ICD-10-CM | POA: Diagnosis not present

## 2014-09-07 DIAGNOSIS — G8929 Other chronic pain: Secondary | ICD-10-CM | POA: Diagnosis not present

## 2014-09-07 DIAGNOSIS — K2270 Barrett's esophagus without dysplasia: Secondary | ICD-10-CM | POA: Diagnosis not present

## 2014-09-07 DIAGNOSIS — Z903 Acquired absence of stomach [part of]: Secondary | ICD-10-CM | POA: Insufficient documentation

## 2014-09-07 DIAGNOSIS — F329 Major depressive disorder, single episode, unspecified: Secondary | ICD-10-CM | POA: Diagnosis not present

## 2014-09-07 DIAGNOSIS — F411 Generalized anxiety disorder: Secondary | ICD-10-CM | POA: Diagnosis not present

## 2014-09-07 DIAGNOSIS — K219 Gastro-esophageal reflux disease without esophagitis: Secondary | ICD-10-CM | POA: Diagnosis not present

## 2014-09-07 DIAGNOSIS — K279 Peptic ulcer, site unspecified, unspecified as acute or chronic, without hemorrhage or perforation: Secondary | ICD-10-CM | POA: Diagnosis not present

## 2014-09-07 DIAGNOSIS — Z7982 Long term (current) use of aspirin: Secondary | ICD-10-CM | POA: Diagnosis not present

## 2014-09-07 DIAGNOSIS — Z886 Allergy status to analgesic agent status: Secondary | ICD-10-CM | POA: Diagnosis not present

## 2014-09-07 LAB — VITAMIN B12: Vitamin B-12: 556 pg/mL (ref 211–911)

## 2014-09-07 LAB — IRON AND TIBC
IRON: 67 ug/dL (ref 42–145)
Saturation Ratios: 20 % (ref 20–55)
TIBC: 339 ug/dL (ref 250–470)
UIBC: 272 ug/dL (ref 125–400)

## 2014-09-07 LAB — TSH: TSH: 3.322 u[IU]/mL (ref 0.350–4.500)

## 2014-09-07 LAB — PREALBUMIN: Prealbumin: 28 mg/dL (ref 17.0–34.0)

## 2014-09-07 LAB — COMPREHENSIVE METABOLIC PANEL
ALT: 13 U/L (ref 0–35)
AST: 17 U/L (ref 0–37)
Albumin: 3.5 g/dL (ref 3.5–5.2)
Alkaline Phosphatase: 63 U/L (ref 39–117)
Anion gap: 7 (ref 5–15)
BUN: 14 mg/dL (ref 6–23)
CO2: 26 mmol/L (ref 19–32)
Calcium: 8.8 mg/dL (ref 8.4–10.5)
Chloride: 105 mmol/L (ref 96–112)
Creatinine, Ser: 0.98 mg/dL (ref 0.50–1.10)
GFR calc Af Amer: 70 mL/min — ABNORMAL LOW (ref >90)
GFR, EST NON AFRICAN AMERICAN: 61 mL/min — AB (ref >90)
Glucose, Bld: 111 mg/dL — ABNORMAL HIGH (ref 70–99)
Potassium: 3.5 mmol/L (ref 3.5–5.1)
SODIUM: 138 mmol/L (ref 135–145)
Total Bilirubin: 0.4 mg/dL (ref 0.3–1.2)
Total Protein: 6.2 g/dL (ref 6.0–8.3)

## 2014-09-07 LAB — FOLATE: Folate: 17.2 ng/mL

## 2014-09-07 LAB — CBC
HCT: 36.9 % (ref 36.0–46.0)
Hemoglobin: 11.8 g/dL — ABNORMAL LOW (ref 12.0–15.0)
MCH: 29.3 pg (ref 26.0–34.0)
MCHC: 32 g/dL (ref 30.0–36.0)
MCV: 91.6 fL (ref 78.0–100.0)
PLATELETS: 261 10*3/uL (ref 150–400)
RBC: 4.03 MIL/uL (ref 3.87–5.11)
RDW: 14.4 % (ref 11.5–15.5)
WBC: 7.7 10*3/uL (ref 4.0–10.5)

## 2014-09-07 LAB — FERRITIN: Ferritin: 16 ng/mL (ref 10–291)

## 2014-09-07 LAB — MAGNESIUM: MAGNESIUM: 2.2 mg/dL (ref 1.5–2.5)

## 2014-09-07 LAB — PHOSPHORUS: Phosphorus: 3.7 mg/dL (ref 2.3–4.6)

## 2014-09-07 MED ORDER — BOOST / RESOURCE BREEZE PO LIQD
1.0000 | Freq: Three times a day (TID) | ORAL | Status: DC
  Administered 2014-09-07: 1 via ORAL

## 2014-09-07 MED ORDER — ENOXAPARIN SODIUM 30 MG/0.3ML ~~LOC~~ SOLN: 30.0000 mg | SUBCUTANEOUS | Status: DC

## 2014-09-07 MED ORDER — BOOST / RESOURCE BREEZE PO LIQD: 1.0000 | Freq: Three times a day (TID) | ORAL | Status: DC

## 2014-09-07 MED ORDER — HYDROCODONE-ACETAMINOPHEN 5-325 MG PO TABS
1.0000 | ORAL_TABLET | Freq: Three times a day (TID) | ORAL | Status: DC | PRN
  Administered 2014-09-07: 1 via ORAL
  Filled 2014-09-07: qty 1

## 2014-09-07 NOTE — Progress Notes (Signed)
INITIAL NUTRITION ASSESSMENT  DOCUMENTATION CODES Per approved criteria  -Severe malnutrition in the context of chronic illness   Pt meets criteria for severe MALNUTRITION in the context of chronic illness as evidenced by severe fat and muscle depletion.  INTERVENTION: -Resource Breeze po TID, each supplement provides 250 kcal and 9 grams of protein -Provided education on high calorie, high protein diet. RD provided educational handouts "High Calorie, High Protein Nutrition Therapy", "High Calorie, High Protein Recipes", and "Suggestions for Increasing Calories and Protein" from [facility]'s Nutrition Care Manual  NUTRITION DIAGNOSIS: Malnutrition related to decreased appetite, nausea as evidenced by severe fat and muscle depletion.   Goal: Pt will meet >90% of estimated nutritional needs  Monitor:  PO/supplement intake, labs, weight changes, I/O's  Reason for Assessment: MST=2, MD consult to assess needs  64 y.o. female  Admitting Dx: <principal problem not specified>  [name] is a 64 y.o. female presenting with severe protein calorie malnutrition. PMH is significant for tardive dyskinesia, lung nodule, Barrett's esophagitis, COPD, chronic abdominal pain, orthostatic hypotension, anxiety, MDD, S/P Roux-en-Y and Nissen procedure.  ASSESSMENT: Pt directly admitted from PCP office due to failure to thrive.  Discussed with RN, who reports that pt is currently tolerating diet well and consumed 100% of her breakfast this AM. Spoke with pt at bedside. She reveals a long standing hx of weight loss over the past several years. Wt hx over the past year has been stable, ranging from 89-92#.  Pt reveals her largest barrier to eating is her persistent nausea. She reveals that she had a PEG tube placed a few weeks ago, which she reports helped her a lot, however, tube fell out after a week or two of use. Pt shares that she has had PEG placed multiple times over the years and had success with this  intervention. Per MD notes, there are no plans to resume TF at this time.  Typical home diet consists of 3 meals per day and 2-3 Resource Breeze supplements (she is unable to tolerate the milk based supplements, as they give her diarrhea and abdominal pain). She reveals that when her nausea is exacerbated, she will continue to eat 3 meals per day, but will eat less.   24 hour diet recall consisted of:  Breakfast: 2 eggs, 2 sausage links, 1/2 cup of cheese grits, 1 Resource Breeze supplement Lunch: 1 can of soup (chicken noodle, tomato, or potato and cheese), 1/2 of large sub sandwich Dinner: same as lunch Beverages: juice, Resource Breeze  Pt reveals that over the past few weeks, her intake has deteriorated due to weakness and the decreased ability to shop and cook necessary foods.  Educated pt on high calorie, high protein diet. Reviewed diet recall and provided examples of ways to increase calories and protein to current regimen. Discussed importance of consuming small, frequent meals to maximize caloric intake. Pt was able to verbalize foods high in protein and discuss ways to incorporate foods in her diet. Also discussed affordable and shelf stable, easy to prepare foods that would increase protein content. Pt agreeable to receiving Resource Breeze supplements while in hospital and to continue to consume after discharge.  Labs reviewed. Glucose: 111.   Nutrition Focused Physical Exam:  Subcutaneous Fat:  Orbital Region: severe depletion Upper Arm Region: severe depletion Thoracic and Lumbar Region: severe depletion  Muscle:  Temple Region: severe depletion Clavicle Bone Region: severe depletion Clavicle and Acromion Bone Region: severe depletion Scapular Bone Region: severe depletion Dorsal Hand: severe depletion  Patellar Region: severe depletion Anterior Thigh Region: severe depletion Posterior Calf Region: severe depletion  Edema: none present  Height: Ht Readings from Last 1  Encounters:  09/06/14 5\' 4"  (1.626 m)    Weight: Wt Readings from Last 1 Encounters:  09/06/14 87 lb 6.4 oz (39.644 kg)    Ideal Body Weight: 120#  % Ideal Body Weight: 73%  Wt Readings from Last 50 Encounters:  09/06/14 87 lb 6.4 oz (39.644 kg)  09/06/14 89 lb 6.4 oz (40.552 kg)  08/16/14 90 lb 3.2 oz (40.914 kg)  08/11/14 89 lb 8.1 oz (40.6 kg)  08/11/14 89 lb 9.6 oz (40.642 kg)  07/26/14 93 lb (42.185 kg)  07/22/14 93 lb (42.185 kg)  06/02/14 90 lb 6.4 oz (41.005 kg)  04/15/14 89 lb 5 oz (40.512 kg)  02/24/14 91 lb (41.277 kg)  02/10/14 91 lb 6.4 oz (41.459 kg)  02/06/14 90 lb 9.6 oz (41.096 kg)  01/20/14 91 lb (41.277 kg)  01/04/14 98 lb 2 oz (44.509 kg)  12/30/13 90 lb (40.824 kg)  12/17/13 92 lb 9.6 oz (42.003 kg)  11/08/13 96 lb (43.545 kg)  10/13/13 95 lb (43.092 kg)  08/18/13 102 lb 3.2 oz (46.358 kg)  07/21/13 99 lb 12.8 oz (45.269 kg)  07/13/13 104 lb 11.5 oz (47.5 kg)  06/11/13 105 lb 6.4 oz (47.809 kg)  05/13/13 105 lb 9.6 oz (47.9 kg)  12/23/12 115 lb (52.164 kg)  04/30/12 132 lb (59.875 kg)  04/16/12 130 lb (58.968 kg)  10/03/11 128 lb (58.06 kg)  09/07/11 127 lb (57.607 kg)  08/24/11 130 lb (58.968 kg)  08/21/11 127 lb (57.607 kg)  07/28/11 136 lb (61.689 kg)  07/25/11 132 lb (59.875 kg)  03/04/11 121 lb (54.885 kg)  01/16/11 125 lb (56.7 kg)  12/19/10 117 lb (53.071 kg)  11/27/10 121 lb (54.885 kg)  11/15/10 116 lb 5 oz (52.759 kg)  11/07/10 116 lb (52.617 kg)  10/23/10 115 lb 5 oz (52.305 kg)  09/05/10 122 lb (55.339 kg)  06/06/10 118 lb (53.524 kg)  04/16/10 123 lb (55.792 kg)  11/17/09 117 lb (53.071 kg)  08/11/09 109 lb (49.442 kg)  05/31/09 126 lb (57.153 kg)  05/11/09 120 lb (54.432 kg)  01/09/09 119 lb (53.978 kg)  09/26/08 129 lb (58.514 kg)  03/18/08 109 lb (49.442 kg)  01/19/08 118 lb (53.524 kg)   Usual Body Weight: 120#  % Usual Body Weight: 73%  BMI:  Body mass index is 14.99 kg/(m^2). Underweight  Estimated  Nutritional Needs: Kcal: 1200-1400 Protein: 50-60 grams Fluid: 1.2-1.4 L  Skin: WDL  Diet Order: Diet regular  EDUCATION NEEDS: -Education needs addressed  No intake or output data in the 24 hours ending 09/07/14 1215  Last BM: 09/06/14  Labs:   Recent Labs Lab 09/06/14 2254  NA 138  K 3.5  CL 105  CO2 26  BUN 14  CREATININE 0.98  CALCIUM 8.8  MG 2.2  PHOS 3.7  GLUCOSE 111*    CBG (last 3)  No results for input(s): GLUCAP in the last 72 hours.  Scheduled Meds: . antiseptic oral rinse  7 mL Mouth Rinse BID  . enoxaparin (LOVENOX) injection  30 mg Subcutaneous Q24H  . FLUoxetine  80 mg Oral Daily  . mirtazapine  15 mg Oral QHS  . nicotine  21 mg Transdermal Daily  . pantoprazole  40 mg Oral BID  . risperiDONE  0.5 mg Oral QHS  . sodium chloride  3 mL Intravenous  Q12H    Continuous Infusions:   Past Medical History  Diagnosis Date  . Peptic ulcer disease     dr Oletta Lamas  . Acute duodenal ulcer with hemorrhage and perforation, with obstruction   . Barrett's esophagus   . Menopause   . GERD (gastroesophageal reflux disease)   . Asthma   . Chronic abdominal pain     narcotic dependence, dr gyarteng-dak at [facility]  . Narcotic abuse   . Anxiety     sees [name] NP at Dr. Radonna Ricker office  . Fx two ribs-open 08-24-11    left 4th and 5th  . Fx of fibula 07-28-11    left fibula, 2 places   . COPD (chronic obstructive pulmonary disease)   . Allergy   . Anemia   . History of blood transfusion     "related to OR; maybe when I perforated that ulcer"  . Lap Nissen + truncal vagotomy July 2008 05/13/2013  . LEG EDEMA 01/19/2008    Qualifier: Diagnosis of  By: [name] MD, [name]   . ACUTE DUODEN ULCER W/HEMORR PERF&OBSTRUCTION 06/26/2004    Qualifier: Diagnosis of  By: [name] MD, [name]   . FEVER, RECURRENT 08/11/2009    Qualifier: Diagnosis of  By: [name] MD, [name] MENOPAUSE 01/07/2007    Qualifier: Diagnosis of  By: [name], CMA, [place]    .  Gastroparesis 06/03/2007    Qualifier: Diagnosis of  By: [name] MD, [name]   . GASTRIC OUTLET OBSTRUCTION 08/28/2007    In July 2008 she underwent laparoscopic enterolysis, Nissen fundoplication over a #57 bougie, single pledgeted suture colosure of the hiatus and a 3 suture wrap.  Lap truncal vagotomy and a loop gastrojejunostomy was performed.  This was revised in August of 2009 to a roux en Y gastrojejujostomy.     . 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 Depression   . History of hiatal hernia   . Complication of anesthesia     pt states had too much xanax on board - agitated   . Anginal pain     "many many years ago"  . Exertional shortness of breath   . Pneumonia, organism unspecified 11/07/2010    Assoc with R Parapneumonic effusion 09/2010    - Tapped 10/05/10    - CxR resolved 11/07/2010    . Headache     "monthly" (09/06/2014)  . Arthritis     "hips" (09/06/2014)    Past Surgical History  Procedure Laterality Date  . Esophagogastroduodenoscopy  12-29-09    dr qadeer at [facility], several gastric ulcers  . Laparoscopic nissen fundoplication    . 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 Repair of perforated ulcer    . Biopsy thyroid  08-13-11    benign nodule, per Dr. Melida Quitter   . Esophagogastroduodenoscopy N/A 09/25/2012    Procedure: ESOPHAGOGASTRODUODENOSCOPY (EGD);  Surgeon: Beryle Beams, MD;  Location: [facility];  Service: Endoscopy;  Laterality: N/A;  . Fracture surgery    . Tonsillectomy    . Cataract extraction w/ intraocular lens  implant, bilateral Bilateral   . Laparoscopic partial gastrectomy N/A 06/29/2013    Procedure: Gastrectomy and GJ Tube placement;  Surgeon: Pedro Earls, MD;  Location: [facility];  Service: General;  Laterality: N/A;  . Colonoscopy    . Gastrostomy N/A 07/26/2014    Procedure: LAPRASCOPIC ASSISTED OPEN PLACEMENT OF JEJUNOSTOMY TUBE;  Surgeon:  Pedro Earls, MD;  Location: [facility];  Service: General;   Laterality: N/A;  . Orif hip fracture Bilateral     "pins in both of them"  . Dilation and curettage of uterus    . Hernia repair      "hiatal"    [address] A. Jimmye Norman, RD, LDN, CDE Pager: [phone] After hours Pager: [phone]

## 2014-09-07 NOTE — Discharge Instructions (Signed)
Discharge Date: 09/07/2014  Reason for Hospitalization: Weight Loss  We are unsure what could be causing your gradual weight loss it is likely due to multiple abdominal surgeries interfering with absorption. We consulted the surgeon to see about another G-tube placement but that was not an option. Nutrition was also consulted and suggested a high carb/portien diet. Continue supplements.  The best thing to do for you at this time is to continue to take nutrition by mouth.   I also would recommend that you schedule a repeat colonoscopy.  Please follow-up with your PCP so that they can further work-up and monitor your weight.   Thank you for letting us participate in your care!  High Protein, High Calorie Diet A high protein, high calorie diet increases the amount of protein and calories you eat. You may need more protein and calories in your diet because of illness, surgery, injury, weight loss, or having a poor appetite. Eating high protein and high calorie foods can help you gain weight, heal, and recover after illness.  SERVING SIZES Measuring foods and serving sizes helps to make sure you are getting the right amount of food. The list below tells how big or small some common serving sizes are.   1 oz.........4 stacked dice.  3 oz........Marland KitchenDeck of cards.  1 tsp.......Marland KitchenTip of little finger.  1 tbs......Marland KitchenMarland KitchenThumb.  2 tbs.......Marland KitchenGolf ball.   cup......Marland KitchenHalf of a fist.  1 cup.......Marland KitchenA fist. HIGH PROTEIN FOODS Dairy  Whole milk.  Whole milk yogurt.  Powdered milk.  Cheese.  Yahoo.  Instant breakfast products.  Eggnog. Tips for adding to your diet:  Use whole milk when making hot cereal, puddings, soups, and hot cocoa.  Add powdered milk to baked goods, smoothies, and milkshakes.  Make whole milk yogurt parfaits by adding granola, fruit, or nuts.  Add cheese to sandwiches, pastas, soups, and casseroles.  Add fruit to cottage cheese. Meat   Beef, pork, and  poultry.  Fish and seafood.  Peanut butter.  Dried beans.  Eggs. Tips for adding to your diet:  Make meat and cheese omelets.  Add eggs to salads and baked goods.  Add fish and seafood to salads.  Add meat and poultry to casseroles, salads, and soups.  Use peanut butter as a topping for pretzels, celery, crackers, or add it to baked goods.  Use beans in casseroles, dips, and spreads. GENERAL GUIDELINES TO INCREASE CALORIES  Replace calorie-free drinks with calorie-containing drinks, such as milk, fruit juices, regular soda, milkshakes, and hot chocolate.  Try to eat 6 small meals instead of 3 large meals each day.  Keep snacks handy, such as nuts, trail mixes, dried fruit, and yogurt.  Choose foods with sauces and gravies.  Add dried fruits, honey, and half-and-half to hot or cold cereal.  Add extra fats when possible, such as butter, sour cream, cream cheese, and salad dressings.  Add cheese to foods often.  Consider adding a clear liquid nutritional supplement to your diet. Your caregiver can give you recommendations. HIGH CALORIE FOODS Grain/Starch  Baked goods, such as muffins and quick breads.  Croissants.  Pancakes and waffles. Vegetable   Sauted vegetables in oil.  Fried vegetables.  Salad greens with regular salad dressing or vinegar and oil. Fruit  Dried fruit.  Canned fruit in syrup.  Fruit juice. Fat  Avocado.  Butter or margarine.  Whipped cream.  Mayonnaise.  Salad dressing.  Peanuts and mixed nuts.  Cream cheese and sour cream. Sweets and Dessert  Cake.  Cookies.  Pie.  Ice cream.  Doughnuts and pastries.  Protein and meal replacement bars.  Jam, preserves, and jelly.  Candy bars.  Chocolate.  Chocolate, caramel, or other flavored syrups. Document Released: 06/10/2005 Document Revised: 09/02/2011 Document Reviewed: 03/13/2007 [facility] Patient Information 2015 [place], [place]. This information is not  intended to replace advice given to you by your health care provider. Make sure you discuss any questions you have with your health care provider.

## 2014-09-07 NOTE — Progress Notes (Signed)
UR completed 
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
## 2014-09-07 NOTE — Progress Notes (Signed)
AVS discharge instructions were reviewed with patient. Patient was told that she had prescription for medication to pick up at [facility]. Patient stated that she did not have any questions. Staff will assist patient to her transportation.

## 2014-09-07 NOTE — Progress Notes (Signed)
Spoke to Dr. Hassell Done regarding this patient that he knows very well.  He has been asked to evaluate her for possible placement of another G or J tube.  The patient has had a prior G-tube as well as a recent placement of a J-tube just in February of 2016.  The patient states this "fell out" when she saw Dr. Hassell Done in the office not 10 days after placement.  He was unable to replaced her tube at that visit given it has been out for at least several days.    He states today that he would not recommend and will not place any further feedings tubes for this patient.  She can take her nutrition in by mouth and that is his recommendation.  I have relayed this to the primary service.  No further general surgery needs.    [name] E 10:20 AM 09/07/2014

## 2014-09-08 ENCOUNTER — Ambulatory Visit (INDEPENDENT_AMBULATORY_CARE_PROVIDER_SITE_OTHER): Payer: Medicare Other | Admitting: Family Medicine

## 2014-09-08 VITALS — BP 120/68 | HR 78 | Temp 98.8°F | Resp 12 | Ht 63.0 in | Wt 88.0 lb

## 2014-09-08 DIAGNOSIS — Z1239 Encounter for other screening for malignant neoplasm of breast: Secondary | ICD-10-CM | POA: Diagnosis not present

## 2014-09-08 DIAGNOSIS — F32A Depression, unspecified: Secondary | ICD-10-CM

## 2014-09-08 DIAGNOSIS — R109 Unspecified abdominal pain: Secondary | ICD-10-CM | POA: Diagnosis not present

## 2014-09-08 DIAGNOSIS — G8929 Other chronic pain: Secondary | ICD-10-CM | POA: Diagnosis not present

## 2014-09-08 DIAGNOSIS — R634 Abnormal weight loss: Secondary | ICD-10-CM | POA: Diagnosis not present

## 2014-09-08 DIAGNOSIS — E43 Unspecified severe protein-calorie malnutrition: Secondary | ICD-10-CM | POA: Diagnosis not present

## 2014-09-08 DIAGNOSIS — F329 Major depressive disorder, single episode, unspecified: Secondary | ICD-10-CM | POA: Diagnosis not present

## 2014-09-08 DIAGNOSIS — Z720 Tobacco use: Secondary | ICD-10-CM | POA: Diagnosis not present

## 2014-09-08 MED ORDER — HYDROCODONE-ACETAMINOPHEN 5-325 MG PO TABS: 1.0000 | ORAL_TABLET | Freq: Three times a day (TID) | ORAL | Status: DC | PRN

## 2014-09-08 MED ORDER — OMEPRAZOLE 40 MG PO CPDR: 40.0000 mg | DELAYED_RELEASE_CAPSULE | Freq: Two times a day (BID) | ORAL | Status: DC

## 2014-09-08 MED ORDER — FLUOXETINE HCL 40 MG PO CAPS: 40.0000 mg | ORAL_CAPSULE | Freq: Every day | ORAL | Status: DC

## 2014-09-08 MED ORDER — MIRTAZAPINE 30 MG PO TABS: 30.0000 mg | ORAL_TABLET | Freq: Every day | ORAL | Status: DC

## 2014-09-08 MED ORDER — ALPRAZOLAM 0.5 MG PO TABS: 0.5000 mg | ORAL_TABLET | Freq: Two times a day (BID) | ORAL | Status: DC | PRN

## 2014-09-08 MED ORDER — RISPERIDONE 1 MG PO TABS: 1.0000 mg | ORAL_TABLET | Freq: Every day | ORAL | Status: DC

## 2014-09-08 MED ORDER — ONDANSETRON 4 MG PO TBDP: ORAL_TABLET | ORAL | Status: DC

## 2014-09-08 NOTE — Progress Notes (Addendum)
Subjective:  This chart was scribed for [name], MD by Mercy Moore, Medial Scribe. This patient was seen in room 13 and the patient's care was started at 8:41 AM.    Patient ID: [name], female    DOB: 06-19-1951, 64 y.o.   MRN: [id number] Chief Complaint  Patient presents with  . Follow-up    from hospitalization - discharged 3/16  . Abdominal Pain    pt states constant stomach pains  . Medication Refill    per pt - most meds need refil (xanax, prozac, vicodin, remeron)    HPI HPI Comments: [name] is a 64 y.o. female with PMHx of chronic weight loss and abdominal pain who presents to the [facility] complaining of constant abdominal pain after recent two hospital admission.   Patient seen here two days ago having severe malnutrition and weight loss presumably due to extensive surgical GI history and worsened by her mood disorders. She is a recent new patient only for the past two months but at that time she had J tube placed for nutrition which fell out before she could use it. Since then has been unable to gain any weight despite reports of high calorie intake with multiple meals prepared by others and three supplemental protein shakes a day. Due to this she presented to office two days prior asking for repeat hospitalization due to her level of malnutrition and hopes that she start refeeding and have medical aid in determining how she might be able to gain weight and nutrition. Unfortunately for the third in the past two months she was discharged from hospital within less than 24 hours without any adequate treatment or evaluation of her complaints.   Nutritionist note reviewed: note clearly states that patient's prior home diet was more than adequate as fa as calorie intake. Nutrition note confirms that patient is consuming adequate meals in addition to protein supplement drinks. Nutritionist notes states that her weight has been stable over the past year  but then reports over the past year she has lost 20lbs. The plan from nutritionist includes telling patient to consume high calorie diet and protein supplements all of which she was doing prior to her hospitalization.   Resident discharge summary has not been done.They did request surgery consult. PA saw patient and said that Dr. Hassell Done would not place any other feeding tubes and she has to take in nutrition by mouth.   Patient's roommate who accompanies her reports that they have been doing everything to help the patient gain weight. She reports that she prepares her meals and has been forgoing work to care for the patient. Patient shares that she is frustrated with the ordeal that she has endured over the past year. Patient expresses interest in seeking another surgeon who will attempt placing another tube.' Patient has been on current Prozac for 30-40 years so we are nevous about decreasing, however we are to be able to increase Romeron and aytpical antipsychotics which will hopefully help with wight gain.  Patient Active Problem List   Diagnosis Date Noted  . Weight loss   . Abdominal pain, chronic, epigastric   . Tobacco abuse   . History of Roux-en-Y gastric bypass   . Protein-calorie malnutrition   . Orthostatic hypotension 08/11/2014  . Malnutrition 07/27/2014  . S/P jejunostomy Feb 2016 07/26/2014  . Severe protein-calorie malnutrition 12/30/2013  . Constipation, chronic 07/10/2013  . Chronic abdominal pain   . COPD (chronic obstructive  pulmonary disease) 11/07/2010  . Major depressive disorder, recurrent episode, moderate 06/06/2010  . LUNG NODULE 03/15/2010  . 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 CIGARETTE SMOKER 08/24/2007  . Anxiety state 01/07/2007  . BARRETT'S ESOPHAGUS, HX OF 07/03/2005   Past Medical History  Diagnosis Date  . Peptic ulcer disease     dr Oletta Lamas  . Acute duodenal ulcer with hemorrhage and perforation, with obstruction   . Barrett's esophagus   .  Menopause   . GERD (gastroesophageal reflux disease)   . Asthma   . Chronic abdominal pain     narcotic dependence, dr gyarteng-dak at [facility]  . Narcotic abuse   . Anxiety     sees [name] Ripple NP at Dr. Radonna Ricker office  . Fx two ribs-open 08-24-11    left 4th and 5th  . Fx of fibula 07-28-11    left fibula, 2 places   . COPD (chronic obstructive pulmonary disease)   . Allergy   . Anemia   . History of blood transfusion     "related to OR; maybe when I perforated that ulcer"  . Lap Nissen + truncal vagotomy July 2008 05/13/2013  . LEG EDEMA 01/19/2008    Qualifier: Diagnosis of  By: [name] MD, [name]   . ACUTE DUODEN ULCER W/HEMORR PERF&OBSTRUCTION 06/26/2004    Qualifier: Diagnosis of  By: [name] MD, [name]   . FEVER, RECURRENT 08/11/2009    Qualifier: Diagnosis of  By: [name] MD, [name] MENOPAUSE 01/07/2007    Qualifier: Diagnosis of  By: [name], CMA, [place]    . Gastroparesis 06/03/2007    Qualifier: Diagnosis of  By: [name] MD, [name]   . GASTRIC OUTLET OBSTRUCTION 08/28/2007    In July 2008 she underwent laparoscopic enterolysis, Nissen fundoplication over a #91 bougie, single pledgeted suture colosure of the hiatus and a 3 suture wrap.  Lap truncal vagotomy and a loop gastrojejunostomy was performed.  This was revised in August of 2009 to a roux en Y gastrojejujostomy.     . 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 Depression   . History of hiatal hernia   . Complication of anesthesia     pt states had too much xanax on board - agitated   . Anginal pain     "many many years ago"  . Exertional shortness of breath   . Pneumonia, organism unspecified 11/07/2010    Assoc with R Parapneumonic effusion 09/2010    - Tapped 10/05/10    - CxR resolved 11/07/2010    . Headache     "monthly" (09/06/2014)  . Arthritis     "hips" (09/06/2014)   Past Surgical History  Procedure Laterality Date  . Esophagogastroduodenoscopy  12-29-09    dr qadeer at  [facility], several gastric ulcers  . Laparoscopic nissen fundoplication    . 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 Repair of perforated ulcer    . Biopsy thyroid  08-13-11    benign nodule, per Dr. Melida Quitter   . Esophagogastroduodenoscopy N/A 09/25/2012    Procedure: ESOPHAGOGASTRODUODENOSCOPY (EGD);  Surgeon: Beryle Beams, MD;  Location: [facility];  Service: Endoscopy;  Laterality: N/A;  . Fracture surgery    . Tonsillectomy    . Cataract extraction w/ intraocular lens  implant, bilateral Bilateral   . Laparoscopic partial gastrectomy N/A 06/29/2013    Procedure: Gastrectomy and Windsor Tube placement;  Surgeon: Pedro Earls, MD;  Location: [facility];  Service: General;  Laterality: N/A;  . Colonoscopy    . Gastrostomy N/A 07/26/2014    Procedure: LAPRASCOPIC ASSISTED OPEN PLACEMENT OF JEJUNOSTOMY TUBE;  Surgeon: Pedro Earls, MD;  Location: [facility];  Service: General;  Laterality: N/A;  . Orif hip fracture Bilateral     "pins in both of them"  . Dilation and curettage of uterus    . Hernia repair      "hiatal"   Allergies  Allergen Reactions  . Celebrex [Celecoxib] Nausea Only  . Morphine Itching    REACTION: itching  . Quetiapine     REACTION: tardive dyskinesia  . Varenicline Tartrate     REACTION: unspecified   Prior to Admission medications   Medication Sig Start Date End Date Taking? Authorizing Provider  ALPRAZolam [name]) 0.5 MG tablet Take 0.5 mg by mouth 2 (two) times daily as needed. For anxiety 07/12/14  Yes Historical Provider, MD  aspirin EC 81 MG tablet Take 81 mg by mouth daily.   Yes Historical Provider, MD  feeding supplement, RESOURCE BREEZE, (RESOURCE BREEZE) LIQD Take 1 Container by mouth 3 (three) times daily between meals. 09/07/14  Yes Katheren Shams, DO  FLUoxetine (PROZAC) 40 MG capsule Take 80 mg by mouth daily.    Yes Historical Provider, MD  HYDROcodone-acetaminophen (NORCO/VICODIN) 5-325 MG per tablet Take 1 tablet by mouth every 12  (twelve) hours as needed for moderate pain. 08/16/14  Yes Shawnee Knapp, MD  megestrol (MEGACE) 40 MG/ML suspension Take 200 mg by mouth daily.  08/16/14  Yes Historical Provider, MD  mirtazapine (REMERON) 15 MG tablet Take 1 tablet (15 mg total) by mouth at bedtime. 08/16/14  Yes Shawnee Knapp, MD  Multiple Vitamin (MULTIVITAMIN WITH MINERALS) TABS tablet Take 1 tablet by mouth daily.   Yes Historical Provider, MD  Multiple Vitamins-Minerals (OCUVITE PO) Take 1 tablet by mouth daily.   Yes Historical Provider, MD  Nutritional Supplements (BOOST BREEZE) LIQD Take 8 oz by mouth 3 (three) times daily. 08/16/14  Yes Shawnee Knapp, MD  omeprazole (PRILOSEC) 40 MG capsule Take 40 mg by mouth 2 (two) times daily.  07/02/14  Yes Historical Provider, MD  ondansetron (ZOFRAN ODT) 4 MG disintegrating tablet 4mg  ODT q4 hours prn nausea/vomit 09/06/14  Yes Shawnee Knapp, MD  risperiDONE (RISPERDAL) 0.5 MG tablet Take 1 tablet (0.5 mg total) by mouth at bedtime. 01/05/14  Yes Johnathan Hausen, MD   History   Social History  . Marital Status: Divorced    Spouse Name: N/A  . Number of Children: N/A  . Years of Education: N/A   Occupational History  . Not on file.   Social History Main Topics  . Smoking status: Current Every Day Smoker -- 2.00 packs/day for 50 years    Types: Cigarettes  . Smokeless tobacco: Never Used  . Alcohol Use: No  . Drug Use: No  . Sexual Activity: No   Other Topics Concern  . Not on file   Social History Narrative      Review of Systems  Constitutional: Positive for fatigue and unexpected weight change. Negative for fever.  Gastrointestinal: Positive for nausea and abdominal pain. Negative for vomiting, diarrhea and constipation.  Psychiatric/Behavioral: Positive for dysphoric mood.       Objective:   Physical Exam  Constitutional: She is oriented to person, place, and time. She appears well-developed and well-nourished. No distress.  Cachectic and frail   HENT:  Head:  Normocephalic  and atraumatic.  Eyes: EOM are normal.  Neck: Neck supple. No tracheal deviation present.  Cardiovascular: Normal rate.   Pulmonary/Chest: Effort normal. No respiratory distress.  Musculoskeletal: Normal range of motion.  Neurological: She is alert and oriented to person, place, and time.  Skin: Skin is warm and dry.  Psychiatric: She has a normal mood and affect. Her behavior is normal.  Nursing note and vitals reviewed.  Very concerned has malabsorption form her prior GI surgeries but have not found a specialist to evalutae or address this so will consider referral to [facility] for further GI evaluation.   Filed Vitals:   09/08/14 0825  BP: 120/68  Pulse: 78  Temp: 98.8 F (37.1 C)  TempSrc: Oral  Resp: 12  Height: 5\' 3"  (1.6 m)  Weight: 88 lb (39.917 kg)  SpO2: 92%   Negative HIV in July 2015, vitamin D in august was 28.  Two days ago labs showed normal vitamin b12, folate and iron although ferratin was too low at 16. Retics are normal.      Assessment & Plan:   Patient instructed to make appointment with psychiatrist of her choice. Patient also has an appointment at [facility] lined up for mid April. We need to start looking for additional causes of weight loss and malnutrition other than her surgical GI history. Decrease Prozac from 80mg  to 40mg , increase Remeron from 15mg  to 30mg , increase Risperdal from .5 qhs to 1qhs, refilled Zofran to take scheduled tid, refilled hydrocodone to take scheduled tid, refilled Xanax to use as needed, vid. Need to check on status of Dexa screen, consider hepatitis screen, consider h pylori testing but on ppi.     Abnormal weight loss - Plan: CT Chest Wo Contrast, CANCELED: Hepatitis C antibody, CANCELED: POCT SEDIMENTATION RATE, CANCELED: C-reactive protein - ensure cancer screening UTD though weight loss seems clearly due to h/o mult abd surgies and chronic abd pain.  sched mammogram, home hemoccult, needs pap/pelvic at  next OV  Severe protein-calorie malnutrition - Plan: CT Chest Wo Contrast, CANCELED: Hepatitis C antibody, CANCELED: POCT SEDIMENTATION RATE, CANCELED: C-reactive protein - continue high protein diet and protein supp shakes as snack 2-3x/d  Can't afford nutrition c/s at this point.  Continuous tobacco abuse - Plan: CT Chest Wo Contrast - call quit line - has to stop and pt ready to try  Depression - contact info given to pt so she can sched w/ psych as her chronic pain and malnutrition as well as recent loss of her mother have caused sig depression - poss that appetite will increase as mood is treated. Reduce prozac from 80 to 40 qd as pt has been on prozac for >20-30 yrs so will be difficult to wean off but clearly this is not currently effectively treating her sxs. Increase remeron from 15 to 30.  Cont risperidone which we just started sev wks ago.  Chronic abdominal pain - pt plans to est w/ [facility] - increase hydrocodone from bid to tid scheduled as well as sched zofran 30 min prior to meals tid - hopefully pain control will help appetite  Screening for breast cancer - Plan: MM Digital Screening  Meds ordered this encounter  Medications  . ondansetron (ZOFRAN ODT) 4 MG disintegrating tablet    Sig: 4mg  ODT q4 hours prn nausea/vomit    Dispense:  30 tablet    Refill:  5  . omeprazole (PRILOSEC) 40 MG capsule    Sig: Take 1 capsule (40  mg total) by mouth 2 (two) times daily.    Dispense:  180 capsule    Refill:  3  . ALPRAZolam (XANAX) 0.5 MG tablet    Sig: Take 1 tablet (0.5 mg total) by mouth 2 (two) times daily as needed. For anxiety    Dispense:  60 tablet    Refill:  0  . FLUoxetine (PROZAC) 40 MG capsule    Sig: Take 1 capsule (40 mg total) by mouth daily.    Dispense:  90 capsule    Refill:  0  . mirtazapine (REMERON) 30 MG tablet    Sig: Take 1 tablet (30 mg total) by mouth at bedtime.    Dispense:  90 tablet    Refill:  0  . risperiDONE (RISPERDAL) 1 MG tablet     Sig: Take 1 tablet (1 mg total) by mouth at bedtime.    Dispense:  90 tablet    Refill:  0  . HYDROcodone-acetaminophen (NORCO/VICODIN) 5-325 MG per tablet    Sig: Take 1 tablet by mouth every 8 (eight) hours as needed for moderate pain.    Dispense:  90 tablet    Refill:  0   Over 40 min spent in face-to-face evaluation of and consultation with patient and coordination of care.  Over 50% of this time was spent counseling this patient.  I personally performed the services described in this documentation, which was scribed in my presence. The recorded information has been reviewed and considered, and addended by me as needed.  [name], MD MPH

## 2014-09-08 NOTE — Patient Instructions (Addendum)
[place]  [address], [place], [place] [address]  [phone]  [place]  Address: [address], [place], [place] [address]  [phone]  [facility] - Dr. Dell Ponto Address: [address], [place], [place] [address]  Phone:([phone] [place], [id number] Phone: [phone]   Do the home hemoccult studies, make an appointment for your mammogram. We will need to do a pelvic exam at your next office visit. Recheck in 3-4 weeks for this - ok to schedule at 104 if you would like.  Cut your prozac in half to 1 tab a day, double your remeron at night, double your risperdal.  Drink tons of fluid and come back into clinic in about 2 weeks for recheck and repeat labs.  Take the zofran three times daily 30 minutes prior to each meal.\  Make an a ppointment with the psychiatrist.  You are going to stop smoking so decide how you want to start - call 1800quitline.   Smoking Hazards Smoking cigarettes is extremely bad for your health. Tobacco smoke has over 200 known poisons in it. It contains the poisonous gases nitrogen oxide and carbon monoxide. There are over 60 chemicals in tobacco smoke that cause cancer. Some of the chemicals found in cigarette smoke include:   Cyanide.   Benzene.   Formaldehyde.   Methanol (wood alcohol).   Acetylene (fuel used in welding torches).   Ammonia.  Even smoking lightly shortens your life expectancy by several years. You can greatly reduce the risk of medical problems for you and your family by stopping now. Smoking is the most preventable cause of death and disease in our society. Within days of quitting smoking, your circulation improves, you decrease the risk of having a heart attack, and your lung capacity improves. There may be some increased phlegm in the first few days after  quitting, and it may take months for your lungs to clear up completely. Quitting for 10 years reduces your risk of developing lung cancer to almost that of a nonsmoker.  WHAT ARE THE RISKS OF SMOKING? Cigarette smokers have an increased risk of many serious medical problems, including:  Lung cancer.   Lung disease (such as pneumonia, bronchitis, and emphysema).   Heart attack and chest pain due to the heart not getting enough oxygen (angina).   Heart disease and peripheral blood vessel disease.   Hypertension.   Stroke.   Oral cancer (cancer of the lip, mouth, or voice box).   Bladder cancer.   Pancreatic cancer.   Cervical cancer.   Pregnancy complications, including premature birth.   Stillbirths and smaller newborn babies, birth defects, and genetic damage to sperm.   Early menopause.   Lower estrogen level for women.   Infertility.   Facial wrinkles.   Blindness.   Increased risk of broken bones (fractures).   Senile dementia.   Stomach ulcers and internal bleeding.   Delayed wound healing and increased risk of complications during surgery. Because of secondhand smoke exposure, children of smokers have an increased risk of the following:   Sudden infant death syndrome (SIDS).   Respiratory infections.   Lung cancer.   Heart disease.   Ear infections.  WHY IS SMOKING ADDICTIVE? Nicotine is the chemical agent in tobacco that is capable of causing addiction or dependence. When you smoke and inhale, nicotine is absorbed rapidly into the bloodstream through your lungs. Both inhaled and  noninhaled nicotine may be addictive.  WHAT ARE THE BENEFITS OF QUITTING?  There are many health benefits to quitting smoking. Some are:   The likelihood of developing cancer and heart disease decreases. Health improvements are seen almost immediately.   Blood pressure, pulse rate, and breathing patterns start returning to normal soon after  quitting.   People who quit may see an improvement in their overall quality of life.  HOW DO YOU QUIT SMOKING? Smoking is an addiction with both physical and psychological effects, and longtime habits can be hard to change. Your health care provider can recommend:  Programs and community resources, which may include group support, education, or therapy.  Replacement products, such as patches, gum, and nasal sprays. Use these products only as directed. Do not replace cigarette smoking with electronic cigarettes (commonly called e-cigarettes). The safety of e-cigarettes is unknown, and some may contain harmful chemicals. FOR MORE INFORMATION  American Lung Association: www.lung.org  American Cancer Society: www.cancer.org Document Released: 07/18/2004 Document Revised: 03/31/2013 Document Reviewed: 11/30/2012 [facility] Patient Information 2015 [place], [place]. This information is not intended to replace advice given to you by your health care provider. Make sure you discuss any questions you have with your health care provider. Smoking Cessation, Tips for Success If you are ready to quit smoking, congratulations! You have chosen to help yourself be healthier. Cigarettes bring nicotine, tar, carbon monoxide, and other irritants into your body. Your lungs, heart, and blood vessels will be able to work better without these poisons. There are many different ways to quit smoking. Nicotine gum, nicotine patches, a nicotine inhaler, or nicotine nasal spray can help with physical craving. Hypnosis, support groups, and medicines help break the habit of smoking. WHAT THINGS CAN I DO TO MAKE QUITTING EASIER?  Here are some tips to help you quit for good:  Pick a date when you will quit smoking completely. Tell all of your friends and family about your plan to quit on that date.  Do not try to slowly cut down on the number of cigarettes you are smoking. Pick a quit date and quit smoking completely starting  on that day.  Throw away all cigarettes.   Clean and remove all ashtrays from your home, work, and car.  On a card, write down your reasons for quitting. Carry the card with you and read it when you get the urge to smoke.  Cleanse your body of nicotine. Drink enough water and fluids to keep your urine clear or pale yellow. Do this after quitting to flush the nicotine from your body.  Learn to predict your moods. Do not let a bad situation be your excuse to have a cigarette. Some situations in your life might tempt you into wanting a cigarette.  Never have "just one" cigarette. It leads to wanting another and another. Remind yourself of your decision to quit.  Change habits associated with smoking. If you smoked while driving or when feeling stressed, try other activities to replace smoking. Stand up when drinking your coffee. Brush your teeth after eating. Sit in a different chair when you read the paper. Avoid alcohol while trying to quit, and try to drink fewer caffeinated beverages. Alcohol and caffeine may urge you to smoke.  Avoid foods and drinks that can trigger a desire to smoke, such as sugary or spicy foods and alcohol.  Ask people who smoke not to smoke around you.  Have something planned to do right after eating or having a cup of coffee.  For example, plan to take a walk or exercise.  Try a relaxation exercise to calm you down and decrease your stress. Remember, you may be tense and nervous for the first 2 weeks after you quit, but this will pass.  Find new activities to keep your hands busy. Play with a pen, coin, or rubber band. Doodle or draw things on paper.  Brush your teeth right after eating. This will help cut down on the craving for the taste of tobacco after meals. You can also try mouthwash.   Use oral substitutes in place of cigarettes. Try using lemon drops, carrots, cinnamon sticks, or chewing gum. Keep them handy so they are available when you have the urge to  smoke.  When you have the urge to smoke, try deep breathing.  Designate your home as a nonsmoking area.  If you are a heavy smoker, ask your health care provider about a prescription for nicotine chewing gum. It can ease your withdrawal from nicotine.  Reward yourself. Set aside the cigarette money you save and buy yourself something nice.  Look for support from others. Join a support group or smoking cessation program. Ask someone at home or at work to help you with your plan to quit smoking.  Always ask yourself, "Do I need this cigarette or is this just a reflex?" Tell yourself, "Today, I choose not to smoke," or "I do not want to smoke." You are reminding yourself of your decision to quit.  Do not replace cigarette smoking with electronic cigarettes (commonly called e-cigarettes). The safety of e-cigarettes is unknown, and some may contain harmful chemicals.  If you relapse, do not give up! Plan ahead and think about what you will do the next time you get the urge to smoke. HOW WILL I FEEL WHEN I QUIT SMOKING? You may have symptoms of withdrawal because your body is used to nicotine (the addictive substance in cigarettes). You may crave cigarettes, be irritable, feel very hungry, cough often, get headaches, or have difficulty concentrating. The withdrawal symptoms are only temporary. They are strongest when you first quit but will go away within 10-14 days. When withdrawal symptoms occur, stay in control. Think about your reasons for quitting. Remind yourself that these are signs that your body is healing and getting used to being without cigarettes. Remember that withdrawal symptoms are easier to treat than the major diseases that smoking can cause.  Even after the withdrawal is over, expect periodic urges to smoke. However, these cravings are generally short lived and will go away whether you smoke or not. Do not smoke! WHAT RESOURCES ARE AVAILABLE TO HELP ME QUIT SMOKING? Your health care  provider can direct you to community resources or hospitals for support, which may include:  Group support.  Education.  Hypnosis.  Therapy. Document Released: 03/08/2004 Document Revised: 10/25/2013 Document Reviewed: 11/26/2012 [facility] Patient Information 2015 [place], [place]. This information is not intended to replace advice given to you by your health care provider. Make sure you discuss any questions you have with your health care provider.

## 2014-09-14 ENCOUNTER — Inpatient Hospital Stay: Admission: RE | Admit: 2014-09-14 | Payer: Self-pay | Source: Ambulatory Visit

## 2014-09-21 NOTE — Progress Notes (Signed)
Has an appt on April first to see Dr Brigitte Pulse

## 2014-09-22 ENCOUNTER — Inpatient Hospital Stay: Admission: RE | Admit: 2014-09-22 | Payer: Self-pay | Source: Ambulatory Visit

## 2014-09-23 ENCOUNTER — Encounter (INDEPENDENT_AMBULATORY_CARE_PROVIDER_SITE_OTHER): Payer: Medicare Other | Admitting: Family Medicine

## 2014-09-23 ENCOUNTER — Encounter: Payer: Self-pay | Admitting: Family Medicine

## 2014-09-27 ENCOUNTER — Ambulatory Visit (INDEPENDENT_AMBULATORY_CARE_PROVIDER_SITE_OTHER): Payer: Medicare Other | Admitting: Family Medicine

## 2014-09-27 ENCOUNTER — Other Ambulatory Visit: Payer: Self-pay | Admitting: Family Medicine

## 2014-09-27 ENCOUNTER — Other Ambulatory Visit: Payer: Self-pay

## 2014-09-27 VITALS — BP 128/80 | HR 86 | Temp 98.6°F | Resp 16 | Ht 63.0 in | Wt 90.0 lb

## 2014-09-27 DIAGNOSIS — G8929 Other chronic pain: Secondary | ICD-10-CM | POA: Diagnosis not present

## 2014-09-27 DIAGNOSIS — R1084 Generalized abdominal pain: Secondary | ICD-10-CM | POA: Diagnosis not present

## 2014-09-27 DIAGNOSIS — E46 Unspecified protein-calorie malnutrition: Secondary | ICD-10-CM | POA: Diagnosis not present

## 2014-09-27 DIAGNOSIS — Z124 Encounter for screening for malignant neoplasm of cervix: Secondary | ICD-10-CM

## 2014-09-27 DIAGNOSIS — F329 Major depressive disorder, single episode, unspecified: Secondary | ICD-10-CM

## 2014-09-27 DIAGNOSIS — Z1239 Encounter for other screening for malignant neoplasm of breast: Secondary | ICD-10-CM | POA: Diagnosis not present

## 2014-09-27 DIAGNOSIS — F32A Depression, unspecified: Secondary | ICD-10-CM

## 2014-09-27 DIAGNOSIS — Z9181 History of falling: Secondary | ICD-10-CM | POA: Diagnosis not present

## 2014-09-27 DIAGNOSIS — R42 Dizziness and giddiness: Secondary | ICD-10-CM | POA: Diagnosis not present

## 2014-09-27 DIAGNOSIS — R634 Abnormal weight loss: Secondary | ICD-10-CM | POA: Diagnosis not present

## 2014-09-27 DIAGNOSIS — R531 Weakness: Secondary | ICD-10-CM

## 2014-09-27 LAB — IFOBT (OCCULT BLOOD): IMMUNOLOGICAL FECAL OCCULT BLOOD TEST: NEGATIVE

## 2014-09-27 MED ORDER — HYDROCODONE-ACETAMINOPHEN 5-325 MG PO TABS: 1.0000 | ORAL_TABLET | Freq: Four times a day (QID) | ORAL | Status: DC

## 2014-09-27 MED ORDER — METOCLOPRAMIDE HCL 5 MG PO TABS: 5.0000 mg | ORAL_TABLET | Freq: Three times a day (TID) | ORAL | Status: DC

## 2014-09-27 NOTE — Progress Notes (Signed)
Subjective:  This chart was scribed for [name],  by Tamsen Roers, at [facility].  This patient was seen in room 12 and the patient's care was started at 10:09 AM.   Chief Complaint  Patient presents with  . Follow-up    for weight loss     Patient ID: [name], female    DOB: 22-Jan-1951, 64 y.o.   MRN: [id number]  HPI  HPI Comments: [name] is a 64 y.o. female who presents to [facility] for a follow up for her weight loss.  She has been having problems with severe malnutrition and weight loss. She has been treated for abdominal pain and multiple surgeries in the past.  Unfortunately she is still continuing to smoke and has not had her routine preventative healthcare.  She has been doing a high protein high calorie diet in addition to schedueled nausea and pain medications which is being monitored by her roommate [name]. This patient is overdue for her colonoscopy mammogram and pap smear. Mammogram has been ordered as well as a chest CT on 3/17 which were not completed due to patient cost.    Patient today states that she feels constant weakness ambulating from room to room in her home. Her roommate is afraid that she will faint at any time. Patient also notes of increased pain when she eats along with nausea which she takes Zofran for 3-4 times a day.  She states that this medication helps with her nausea  Patient states that she eats three meals a day in smaller portions and drinks fluids like water, orange juice and coffee.  Last night she ate chicken alfredo but was not able to eat half of it.  She states that she had to cancel her pain management. Patient takes protein twice a day but states that she forgets it at times.  Patient notes that she is not having any issues with her bowel movements or urination. Patient is still taking two Prozac's per day but is willing to go down to 1 per day. Patient reported that she has tried  reglan in the past without success.       Past Medical History  Diagnosis Date  . Peptic ulcer disease     dr Oletta Lamas  . Acute duodenal ulcer with hemorrhage and perforation, with obstruction   . Barrett's esophagus   . Menopause   . GERD (gastroesophageal reflux disease)   . Asthma   . Chronic abdominal pain     narcotic dependence, dr gyarteng-dak at [facility]  . Narcotic abuse   . Anxiety     sees [name] NP at Dr. Radonna Ricker office  . Fx two ribs-open 08-24-11    left 4th and 5th  . Fx of fibula 07-28-11    left fibula, 2 places   . COPD (chronic obstructive pulmonary disease)   . Allergy   . Anemia   . History of blood transfusion     "related to OR; maybe when I perforated that ulcer"  . Lap Nissen + truncal vagotomy July 2008 05/13/2013  . LEG EDEMA 01/19/2008    Qualifier: Diagnosis of  By: [name] MD, [name]   . ACUTE DUODEN ULCER W/HEMORR PERF&OBSTRUCTION 06/26/2004    Qualifier: Diagnosis of  By: [name] MD, [name]   . FEVER, RECURRENT 08/11/2009    Qualifier: Diagnosis of  By: [name] MD, [place] 01/07/2007  Qualifier: Diagnosis of  By: [name], [place], [place]    . Gastroparesis 06/03/2007    Qualifier: Diagnosis of  By: [name] MD, [name]   . GASTRIC OUTLET OBSTRUCTION 08/28/2007    In July 2008 she underwent laparoscopic enterolysis, Nissen fundoplication over a #13 bougie, single pledgeted suture colosure of the hiatus and a 3 suture wrap.  Lap truncal vagotomy and a loop gastrojejunostomy was performed.  This was revised in August of 2009 to a roux en Y gastrojejujostomy.     . 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 Depression   . History of hiatal hernia   . Complication of anesthesia     pt states had too much xanax on board - agitated   . Anginal pain     "many many years ago"  . Exertional shortness of breath   . Pneumonia, organism unspecified 11/07/2010    Assoc with R Parapneumonic effusion 09/2010    - Tapped  10/05/10    - CxR resolved 11/07/2010    . Headache     "monthly" (09/06/2014)  . Arthritis     "hips" (09/06/2014)    Current Outpatient Prescriptions on File Prior to Visit  Medication Sig Dispense Refill  . ALPRAZolam (XANAX) 0.5 MG tablet Take 1 tablet (0.5 mg total) by mouth 2 (two) times daily as needed. For anxiety 60 tablet 0  . aspirin EC 81 MG tablet Take 81 mg by mouth daily.    . feeding supplement, RESOURCE BREEZE, (RESOURCE BREEZE) LIQD Take 1 Container by mouth 3 (three) times daily between meals. 2 Container 0  . FLUoxetine (PROZAC) 40 MG capsule Take 1 capsule (40 mg total) by mouth daily. 90 capsule 0  . HYDROcodone-acetaminophen (NORCO/VICODIN) 5-325 MG per tablet Take 1 tablet by mouth every 8 (eight) hours as needed for moderate pain. 90 tablet 0  . mirtazapine (REMERON) 30 MG tablet Take 1 tablet (30 mg total) by mouth at bedtime. 90 tablet 0  . Multiple Vitamin (MULTIVITAMIN WITH MINERALS) TABS tablet Take 1 tablet by mouth daily.    . Multiple Vitamins-Minerals (OCUVITE PO) Take 1 tablet by mouth daily.    . Nutritional Supplements (BOOST BREEZE) LIQD Take 8 oz by mouth 3 (three) times daily. 90 Can 11  . omeprazole (PRILOSEC) 40 MG capsule Take 1 capsule (40 mg total) by mouth 2 (two) times daily. 180 capsule 3  . ondansetron (ZOFRAN ODT) 4 MG disintegrating tablet 4mg  ODT q4 hours prn nausea/vomit 30 tablet 5  . risperiDONE (RISPERDAL) 1 MG tablet Take 1 tablet (1 mg total) by mouth at bedtime. 90 tablet 0   No current facility-administered medications on file prior to visit.    Allergies  Allergen Reactions  . Celebrex [Celecoxib] Nausea Only  . Morphine Itching    REACTION: itching  . Quetiapine     REACTION: tardive dyskinesia  . Varenicline Tartrate     REACTION: unspecified       Review of Systems  Constitutional: Positive for unexpected weight change.  HENT: Negative for nosebleeds.   Eyes: Negative for discharge.  Gastrointestinal: Positive for  nausea. Negative for vomiting, diarrhea and constipation.  Neurological: Positive for weakness.       Objective:   Physical Exam  Constitutional: She is oriented to person, place, and time. No distress.  HENT:  Head: Normocephalic and atraumatic.  Eyes: Conjunctivae and EOM are normal.  Neck: Neck supple.  Cardiovascular: Normal rate.   Pulmonary/Chest: Effort normal. No respiratory  distress.  Genitourinary:  no pubic hair  normal labia majora and minora severely atrophic vagginitis with some labial fusion paiteint only able to tolderate one single finger digital vaginal exam with suspicious on uterus being severely retroverted.  tiny palp cervic immediately posterioriolally situated towards rectum.. blind pap collected but patient really unable to tolderate any additional exam  no discharge unable to acess uterus or adenexa on manual.  normal digital rectal exam. normal breast exam.   Musculoskeletal: Normal range of motion.  Neurological: She is alert and oriented to person, place, and time.  Skin: Skin is warm and dry.  Diffuse erythematous macules over entire abd - appear to be bruising but pt states is chronic scarring from years of heating pad to abd  Psychiatric: Her speech is normal and behavior is normal. Cognition and memory are normal. She exhibits a depressed mood.  Nursing note and vitals reviewed.  BP 128/80 mmHg  Pulse 86  Temp(Src) 98.6 F (37 C) (Oral)  Resp 16  Ht 5\' 3"  (1.6 m)  Wt 90 lb (40.824 kg)  BMI 15.95 kg/m2  SpO2 98%     Assessment & Plan:   Malnutrition - Plan: IFOBT POC (occult bld, rslt in office) - cont bid to tid snacks w/ protein supp drinks - keep food diary  Screening for cervical cancer - Plan: Pap IG and HPV (high risk) DNA detection - pap is negative but pt is + for HR HPV so need repeat cotesting in 12 months unless HPV typing is + for  Type 16 or 18 in which case pt will need colposcopy.  Therefore, will refer to [facility] gyn due to  severity of atrophic vaginitis with small introitus made pt unable to tolerate speculum exam and pap was collected blindly w/ only 1 finger so not AT ALL sure that I actually sampled the cervix vs just vaginal vault during pap collection.  Screening for breast cancer - normal breast exam - ok to defer mammography for now as will be painful due to minimal breast tissue which also allows a reassuring clinical exam  Loss of weight - Plan: IFOBT POC (occult bld, rslt in office) - want to try to max out remeron so will need to try to go down on prozac - decrease from 80 to 40. Will also try to push up atypical antipsychotic - risperdal - as may have side effect of some weight gain  Weakness -   Risk for falls - using walker at home  Orthostatic lightheadedness - s/p 2L NS IVF administered in office today with good result  Depression - on prozac for >20-30 yrs so will gradually try to wean down/off - and try to get onto cymbalta and remeron if mood tolerates  Chronic abd pain and nausea - reports reglan has not worked well for her in the past and getting partial relief w/ zofran but still c/o early satiety and gastroparesis type sxs so advise stopping sched tid zofran 4mg  and start reglan 5mg  qid 30 min before meals along with hydrocodone as pt has increased pain with eating and digestion.  Pt has appt w/ [facility] but encouraged pt to reschedule appt - push back - as currently having enough other medicaton changes that I do not want her increased to quickly on narcotics and I will be willing to manage for now since pt has been so compliant and responsive to changes and instructions.  Meds ordered this encounter  Medications  . HYDROcodone-acetaminophen (NORCO/VICODIN) 5-325 MG per  tablet    Sig: Take 1 tablet by mouth every 6 (six) hours.    Dispense:  120 tablet    Refill:  0  . metoCLOPramide (REGLAN) 5 MG tablet    Sig: Take 1 tablet (5 mg total) by mouth 4 (four) times daily -  before meals and at  bedtime.    Dispense:  120 tablet    Refill:  1   Over 40 min spent in face-to-face evaluation of and consultation with patient and coordination of care.  Over 50% of this time was spent counseling this patient.   I personally performed the services described in this documentation, which was scribed in my presence. The recorded information has been reviewed and considered, and addended by me as needed.  [name], MD MPH   Results for orders placed or performed in visit on 09/27/14  IFOBT POC (occult bld, rslt in office)  Result Value Ref Range   IFOBT Negative   Pap IG and HPV (high risk) DNA detection  Result Value Ref Range   HPV DNA High Risk Detected (A)    Specimen adequacy: SEE NOTE    FINAL DIAGNOSIS: SEE NOTE    COMMENTS: SEE NOTE    Cytotechnologist: SEE NOTE    QC Cytotechnologist: SEE NOTE   \

## 2014-09-27 NOTE — Patient Instructions (Addendum)
Make sure you are only taking prozac 40mg  daily (1 tab).  Consider trying to go down on the prilosec to 1 tab a day as well but you can go up to twice a day if you continue to have pain.  Stop the zofran - you can use as needed - instead we are going to have you take 1 metoclopramide 5 mg and 1 hydrocodone 5mg  30 minutes before each meal (breakfast, lunch, dinner,) and before bed.  I want you to do the Breeze supplements mid morning, mid afternoon, and before bed - if you want to try to change either of these to a high protein snack that is fine.  Please keep a food diary and bring it to your next visit.  It is ok to push back your pain management appointment for now if you are ok with our current regimen.  It is really important to me that you schedule your chest CT.  I also think that scheduling with a psychiatrist might be even more beneficial than pain management - check out the new clinic that opened in [place] with Dr. Yehuda Budd [phone] - check out at the [facility]" - he is REALLY great with complicated medication regimens so I think could be an EXCELLENT resource to increase your weight through medications.  High Protein, High Calorie Diet A high protein, high calorie diet increases the amount of protein and calories you eat. You may need more protein and calories in your diet because of illness, surgery, injury, weight loss, or having a poor appetite. Eating high protein and high calorie foods can help you gain weight, heal, and recover after illness.  SERVING SIZES Measuring foods and serving sizes helps to make sure you are getting the right amount of food. The list below tells how big or small some common serving sizes are.   1 oz.........4 stacked dice.  3 oz........Marland KitchenDeck of cards.  1 tsp.......Marland KitchenTip of little finger.  1 tbs......Marland KitchenMarland KitchenThumb.  2 tbs.......Marland KitchenGolf ball.   cup......Marland KitchenHalf of a fist.  1 cup.......Marland KitchenA fist. HIGH PROTEIN  FOODS Dairy  Whole milk.  Whole milk yogurt.  Powdered milk.  Cheese.  Yahoo.  Instant breakfast products.  Eggnog. Tips for adding to your diet:  Use whole milk when making hot cereal, puddings, soups, and hot cocoa.  Add powdered milk to baked goods, smoothies, and milkshakes.  Make whole milk yogurt parfaits by adding granola, fruit, or nuts.  Add cheese to sandwiches, pastas, soups, and casseroles.  Add fruit to cottage cheese. Meat   Beef, pork, and poultry.  Fish and seafood.  Peanut butter.  Dried beans.  Eggs. Tips for adding to your diet:  Make meat and cheese omelets.  Add eggs to salads and baked goods.  Add fish and seafood to salads.  Add meat and poultry to casseroles, salads, and soups.  Use peanut butter as a topping for pretzels, celery, crackers, or add it to baked goods.  Use beans in casseroles, dips, and spreads. GENERAL GUIDELINES TO INCREASE CALORIES  Replace calorie-free drinks with calorie-containing drinks, such as milk, fruit juices, regular soda, milkshakes, and hot chocolate.  Try to eat 6 small meals instead of 3 large meals each day.  Keep snacks handy, such as nuts, trail mixes, dried fruit, and yogurt.  Choose foods with sauces and gravies.  Add dried fruits, honey, and half-and-half to hot or cold cereal.  Add extra fats when possible, such as butter, sour cream,  cream cheese, and salad dressings.  Add cheese to foods often.  Consider adding a clear liquid nutritional supplement to your diet. Your caregiver can give you recommendations. HIGH CALORIE FOODS Grain/Starch  Baked goods, such as muffins and quick breads.  Croissants.  Pancakes and waffles. Vegetable   Sauted vegetables in oil.  Fried vegetables.  Salad greens with regular salad dressing or vinegar and oil. Fruit  Dried fruit.  Canned fruit in syrup.  Fruit juice. Fat  Avocado.  Butter or margarine.  Whipped  cream.  Mayonnaise.  Salad dressing.  Peanuts and mixed nuts.  Cream cheese and sour cream. Sweets and Dessert  Cake.  Cookies.  Pie.  Ice cream.  Doughnuts and pastries.  Protein and meal replacement bars.  Jam, preserves, and jelly.  Candy bars.  Chocolate.  Chocolate, caramel, or other flavored syrups. Document Released: 06/10/2005 Document Revised: 09/02/2011 Document Reviewed: 03/13/2007 [facility] Patient Information 2015 [place], [place]. This information is not intended to replace advice given to you by your health care provider. Make sure you discuss any questions you have with your health care provider.

## 2014-09-29 LAB — PAP IG AND HPV HIGH-RISK: HPV DNA High Risk: DETECTED — AB

## 2014-10-03 LAB — HPV TYPE 16/18
HPV Genotype, 16: NOT DETECTED
HPV Genotype, 18: NOT DETECTED

## 2014-10-04 ENCOUNTER — Ambulatory Visit
Admission: RE | Admit: 2014-10-04 | Discharge: 2014-10-04 | Disposition: A | Discharge status: Home / Self Care | Payer: Medicare Other | Source: Ambulatory Visit | Attending: Family Medicine | Admitting: Family Medicine

## 2014-10-04 ENCOUNTER — Encounter: Payer: Self-pay | Admitting: Family Medicine

## 2014-10-04 DIAGNOSIS — R634 Abnormal weight loss: Secondary | ICD-10-CM | POA: Diagnosis not present

## 2014-10-04 DIAGNOSIS — Z720 Tobacco use: Secondary | ICD-10-CM

## 2014-10-04 DIAGNOSIS — J432 Centrilobular emphysema: Secondary | ICD-10-CM | POA: Diagnosis not present

## 2014-10-04 DIAGNOSIS — F17200 Nicotine dependence, unspecified, uncomplicated: Secondary | ICD-10-CM | POA: Diagnosis not present

## 2014-10-04 DIAGNOSIS — R911 Solitary pulmonary nodule: Secondary | ICD-10-CM | POA: Diagnosis not present

## 2014-10-04 DIAGNOSIS — E43 Unspecified severe protein-calorie malnutrition: Secondary | ICD-10-CM

## 2014-10-05 ENCOUNTER — Encounter: Payer: Self-pay | Admitting: Family Medicine

## 2014-10-05 ENCOUNTER — Telehealth: Payer: Self-pay

## 2014-10-05 DIAGNOSIS — M4850XA Collapsed vertebra, not elsewhere classified, site unspecified, initial encounter for fracture: Secondary | ICD-10-CM | POA: Insufficient documentation

## 2014-10-05 DIAGNOSIS — I2510 Atherosclerotic heart disease of native coronary artery without angina pectoris: Secondary | ICD-10-CM | POA: Insufficient documentation

## 2014-10-05 DIAGNOSIS — M810 Age-related osteoporosis without current pathological fracture: Secondary | ICD-10-CM | POA: Insufficient documentation

## 2014-10-05 DIAGNOSIS — I2584 Coronary atherosclerosis due to calcified coronary lesion: Secondary | ICD-10-CM

## 2014-10-05 NOTE — Telephone Encounter (Signed)
Pt would like to have some nausea medicine called in for her, didn't know if Dr.Shaw had given her some before Please call [name]

## 2014-10-05 NOTE — Progress Notes (Signed)
disreguard visit. Pt was sched at 104 but I was running almost 2 hrs behind and pt could not wait due to ride - she wlll f/u at 104 with me. This encounter was created in error - please disregard.

## 2014-10-05 NOTE — Telephone Encounter (Signed)
Dr Brigitte Pulse- It looks like we called in zofran for pt last month. Do you want to rx her some more?

## 2014-10-06 ENCOUNTER — Other Ambulatory Visit: Payer: Self-pay | Admitting: Family Medicine

## 2014-10-06 MED ORDER — ONDANSETRON 4 MG PO TBDP: 4.0000 mg | ORAL_TABLET | Freq: Three times a day (TID) | ORAL | Status: DC | PRN

## 2014-10-06 NOTE — Telephone Encounter (Signed)
Patient would like a call back on her SCAN.   [phone]

## 2014-10-06 NOTE — Telephone Encounter (Signed)
Called pt and reviewed scan (had already been contacted by rad staff), encouraged pt to cont w/ food log - she has gotten frustrated at herself for forgetting to record everything then got mad at herself for not eating enough so stopped it after just a few days - encouraged pt to restart metoclopramide - she had not tried it as it wasn't working previously, refilled zofran, and discussed f/u in 2 wks - early May to review, recheck weight, assess pain management, review food log. Pt reminded to start asa 81mg  qd. Pt denied any chest pain or CAD sxs.

## 2014-10-18 ENCOUNTER — Telehealth: Payer: Self-pay

## 2014-10-18 NOTE — Telephone Encounter (Signed)
Pt says she has a problem and needs to talk to Dr Brigitte Pulse to solve it. She would not tell me her issue

## 2014-10-18 NOTE — Telephone Encounter (Signed)
lmom to cb. 

## 2014-10-18 NOTE — Telephone Encounter (Signed)
Pt states she is really dehydrated. She was here last time and received 2 bags of fluid with glucose and she wants to inquire about vitamin b12 shot. She states she is in bad shape and she feels terrible. Please advise.

## 2014-10-18 NOTE — Telephone Encounter (Signed)
Pt needs to come back in if she wants IVF again - we know that she will likely need IVF regularly due to her chronic malnutrition and dehydration but for this it will always require a provider visit in clinic.   It seems that she does feel better after this and if pt agrees I will plan on seeing her on Thursday morning for this.  i think that a b12 might be a great idea - normally done monthly - and will address at that time - we will usually want to draw a level before the injection but should able to get off of the IV.  Please have pt bring all of her meds and med bottles inc all supplements or any vitamins or otc meds she is taking so we can review and adjust.  Bring in diet diary

## 2014-10-19 DIAGNOSIS — G8929 Other chronic pain: Secondary | ICD-10-CM | POA: Diagnosis not present

## 2014-10-19 DIAGNOSIS — K3184 Gastroparesis: Secondary | ICD-10-CM | POA: Diagnosis not present

## 2014-10-19 DIAGNOSIS — G541 Lumbosacral plexus disorders: Secondary | ICD-10-CM | POA: Diagnosis not present

## 2014-10-19 DIAGNOSIS — G603 Idiopathic progressive neuropathy: Secondary | ICD-10-CM | POA: Diagnosis not present

## 2014-10-19 DIAGNOSIS — R1084 Generalized abdominal pain: Secondary | ICD-10-CM | POA: Diagnosis not present

## 2014-10-19 NOTE — Telephone Encounter (Signed)
lmom to cb. 

## 2014-10-20 ENCOUNTER — Ambulatory Visit (INDEPENDENT_AMBULATORY_CARE_PROVIDER_SITE_OTHER): Payer: Medicare Other | Admitting: Family Medicine

## 2014-10-20 VITALS — BP 118/74 | HR 84 | Temp 97.7°F | Resp 16 | Ht 63.25 in | Wt 89.0 lb

## 2014-10-20 DIAGNOSIS — E538 Deficiency of other specified B group vitamins: Secondary | ICD-10-CM

## 2014-10-20 DIAGNOSIS — F331 Major depressive disorder, recurrent, moderate: Secondary | ICD-10-CM | POA: Diagnosis not present

## 2014-10-20 DIAGNOSIS — Z79899 Other long term (current) drug therapy: Secondary | ICD-10-CM

## 2014-10-20 DIAGNOSIS — R109 Unspecified abdominal pain: Secondary | ICD-10-CM

## 2014-10-20 DIAGNOSIS — F32A Depression, unspecified: Secondary | ICD-10-CM

## 2014-10-20 DIAGNOSIS — F329 Major depressive disorder, single episode, unspecified: Secondary | ICD-10-CM

## 2014-10-20 DIAGNOSIS — E43 Unspecified severe protein-calorie malnutrition: Secondary | ICD-10-CM | POA: Diagnosis not present

## 2014-10-20 DIAGNOSIS — R634 Abnormal weight loss: Secondary | ICD-10-CM | POA: Diagnosis not present

## 2014-10-20 DIAGNOSIS — G8929 Other chronic pain: Secondary | ICD-10-CM

## 2014-10-20 DIAGNOSIS — Z720 Tobacco use: Secondary | ICD-10-CM

## 2014-10-20 DIAGNOSIS — G894 Chronic pain syndrome: Secondary | ICD-10-CM | POA: Diagnosis not present

## 2014-10-20 MED ORDER — METOCLOPRAMIDE HCL 10 MG PO TABS: 10.0000 mg | ORAL_TABLET | Freq: Three times a day (TID) | ORAL | Status: DC

## 2014-10-20 MED ORDER — CYANOCOBALAMIN 1000 MCG/ML IJ SOLN
1000.0000 ug | INTRAMUSCULAR | Status: DC
  Administered 2014-10-20: 1000 ug via INTRAMUSCULAR

## 2014-10-20 MED ORDER — FLUOXETINE HCL 20 MG PO TABS: 20.0000 mg | ORAL_TABLET | Freq: Every day | ORAL | Status: DC

## 2014-10-20 MED ORDER — ALPRAZOLAM 0.5 MG PO TABS: 0.5000 mg | ORAL_TABLET | Freq: Two times a day (BID) | ORAL | Status: DC | PRN

## 2014-10-20 MED ORDER — HYDROCODONE-ACETAMINOPHEN 7.5-325 MG PO TABS: 1.0000 | ORAL_TABLET | Freq: Four times a day (QID) | ORAL | Status: DC

## 2014-10-20 NOTE — Telephone Encounter (Signed)
Called pt again to let her know to come in today. No answer, left message.

## 2014-10-20 NOTE — Patient Instructions (Addendum)
You are using WAY more pain medicine than I have prescribed you - which is NOT ok.  We are going to try to treat your pain and nausea but if you are not using your medications as they are intended then you need to come into clinic immediately so you can tell me why you have needed to change them and we can alter your regimen.  If you need to change the medication, you need to come in to talk to me before you do this. I prescribed you hydrocodone to use 4 times a day and you have been using it 6 times a day.  Therefore, we are going to change you from hydrocodone 5 to 7.5 which is an equivalent dose - but if you use more than prescribed without asking I will feel the need to stop prescribing. You can use the hydrocodone 4 times a day before or after each mean and before bed.  You need to continue taking the reglan before each meal.  Do NOT use more zofran than prescribed - or any other medicine. If they don't work at the doses that they are prescribed, then don't take them. Make sure you bring all of your medications with you to each visit. I know that I will ask you to do many things that you don't think are necessary, but I am also willing to give your more pain medication that I think is necessary - hopefully as we work together we can get your symptoms improved. I asked you to do several things at our last visit and you have not done any of them - you live in pain and you make numerous statements about yourself that are negative - you readily take the blame for everything that crosses your path.  You need to have a outlet to refelect some tof this back to you. Eat small meals throughout the day - you should be grazing. WRITE IT DOWN AND BRING IT IN - I don't care if it is perfect - you need to do it. WRITE DOWN SOMETHING YOU LIKE ABOUT YOURSELF EVERYDAY. YOU HAVE TO FIND A THERAPIST WHO YOU THINK YOU MIGHT LIKE AND SCHEDULE A VISIT WITH THEM - CHECK OUT  [facility]  [address]  [address], [place], [place] [address]  [phone]  [facility]  Address: [address], [place], [place] [address]  Phone:([phone]) [facility] [phone] [place]

## 2014-10-20 NOTE — Progress Notes (Signed)
Subjective:  This chart was scribed for [name], MD by Ladene Artist, ED Scribe. The patient was seen in room 3. Patient's care was started at 12:00 PM.   Patient ID: [name], female    DOB: July 30, 1950, 64 y.o.   MRN: [id number]  Chief Complaint  Patient presents with  . Emphysema  . Malnutrition  . Gastro issues  . Other    Issues with Pain Management Clinic  . Medication Refills    Xanax, Norco, Zofran   HPI HPI Comments: [name] is a 64 y.o. female, with a h/o COPD, asthma, narcotic abuse, anemia, arthritis, depression, who presents to the [facility] for a mediation refill of Xanax, Norco and Zofran. Pt states that she recently went to [facility] but was told that they would not see her as she did not bring her medication bottles with her though she did bring her med list and pharmacy fills list. Instead, they scheduled an appointment with an individual psychiatrist 3 weeks out before pt could be accepted into pain management. Pt is concerned that she can't afford the $100 copay that this requires so has not scheduled the eval yet.  Pt's weight unchanged over the past 6 weeks.   Pt states her abd pain varies considerable - she reports that some days she does not need any and then on other days she might take 4-6 tabs - her roommate [name] holds them for her and she has to ask for one - she states that [name] will often giver her a hard time about this - teases her and makes her feel bad.  She states she is completely out of the oxycodone.  Last visit I told her that she should take the hydrocodone scheduled which she did not do.  Pt has used 120 hydrocodone 5 in past 3 weeks - so using 6 tabs/day on average when I only prescribed her 4/d - pt puzzled by this - since she doesn't take any of it on some days, then she has to take >6 or even up to 12 on others - pt unable to provide any further details about hydrocodone use.  At last visit pt  was asked to keep a food diary.  She states she did it for a few days but then kept forgetting to write things down when eating and at the of the day she couldn't remember so she gave up.  Pt is taking breeze prot supp as well as eating small freq meals throughout the day.  Asked pt to retry on the diary - reinforced that accuracy and completion is not the point of this exercise but rather for her just to engage in her care and increase awareness of her habits - as well as giving me a little more detailed picture of the calories and nutrients she is recieving  zofran helps a little  But only if she takes 3-5 of the 4mg  tabs at once.  She has not noticed any improvement on reglan. She did not bring any of her medication bottles today and I am doubtful of her compliance w/ meds.   Not sleeping much. Has not seen psych. Thinks she is pos depressed - she frequently degrades herself to the visit - stating that "it's all my fault"  "I promise i'll try harder to do things right"  Has been on prozac for >20-30 yrs - recently prescribed by her surgeon and still on plan to  wean off.  I suggested to patient that she can't be so hard on herself - during her visit she repeatedly punishes herself and speaks poorly of herself esp when she is not complying with the recommended plan - advised pt that if she could be more tolerant and permissive of herself than hopefully her depression might improve which will likely also help chronic pain/sleep disturbance/appetite etc.   Pt advised to write down 1 thing she likes about herself daily in her food diary - pt cannot even think of 1 positive thing to say about herself in the office today.   Past Medical History  Diagnosis Date  . Peptic ulcer disease     dr Oletta Lamas  . Acute duodenal ulcer with hemorrhage and perforation, with obstruction   . Barrett's esophagus   . Menopause   . GERD (gastroesophageal reflux disease)   . Asthma   . Chronic abdominal pain     narcotic  dependence, dr gyarteng-dak at [facility]  . Narcotic abuse   . Anxiety     sees [name] NP at Dr. Radonna Ricker office  . Fx two ribs-open 08-24-11    left 4th and 5th  . Fx of fibula 07-28-11    left fibula, 2 places   . COPD (chronic obstructive pulmonary disease)   . Allergy   . Anemia   . History of blood transfusion     "related to OR; maybe when I perforated that ulcer"  . Lap Nissen + truncal vagotomy July 2008 05/13/2013  . LEG EDEMA 01/19/2008    Qualifier: Diagnosis of  By: [name] MD, [name]   . ACUTE DUODEN ULCER W/HEMORR PERF&OBSTRUCTION 06/26/2004    Qualifier: Diagnosis of  By: [name] MD, [name]   . FEVER, RECURRENT 08/11/2009    Qualifier: Diagnosis of  By: [name] MD, [name] MENOPAUSE 01/07/2007    Qualifier: Diagnosis of  By: [name], CMA, [place]    . Gastroparesis 06/03/2007    Qualifier: Diagnosis of  By: [name] MD, [name]   . GASTRIC OUTLET OBSTRUCTION 08/28/2007    In July 2008 she underwent laparoscopic enterolysis, Nissen fundoplication over a #02 bougie, single pledgeted suture colosure of the hiatus and a 3 suture wrap.  Lap truncal vagotomy and a loop gastrojejunostomy was performed.  This was revised in August of 2009 to a roux en Y gastrojejujostomy.     . 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 Depression   . History of hiatal hernia   . Complication of anesthesia     pt states had too much xanax on board - agitated   . Anginal pain     "many many years ago"  . Exertional shortness of breath   . Pneumonia, organism unspecified 11/07/2010    Assoc with R Parapneumonic effusion 09/2010    - Tapped 10/05/10    - CxR resolved 11/07/2010    . Headache     "monthly" (09/06/2014)  . Arthritis     "hips" (09/06/2014)   Current Outpatient Prescriptions on File Prior to Visit  Medication Sig Dispense Refill  . ALPRAZolam (XANAX) 0.5 MG tablet Take 1 tablet (0.5 mg total) by mouth 2 (two) times daily as needed. For anxiety 60 tablet 0   . aspirin EC 81 MG tablet Take 81 mg by mouth daily.    . feeding supplement, RESOURCE BREEZE, (RESOURCE BREEZE) LIQD Take 1 Container by mouth 3 (three) times daily between meals.  2 Container 0  . FLUoxetine (PROZAC) 40 MG capsule Take 1 capsule (40 mg total) by mouth daily. 90 capsule 0  . mirtazapine (REMERON) 30 MG tablet Take 1 tablet (30 mg total) by mouth at bedtime. 90 tablet 0  . Multiple Vitamin (MULTIVITAMIN WITH MINERALS) TABS tablet Take 1 tablet by mouth daily.    . Multiple Vitamins-Minerals (OCUVITE PO) Take 1 tablet by mouth daily.    . Nutritional Supplements (BOOST BREEZE) LIQD Take 8 oz by mouth 3 (three) times daily. 90 Can 11  . omeprazole (PRILOSEC) 40 MG capsule Take 1 capsule (40 mg total) by mouth 2 (two) times daily. 180 capsule 3  . ondansetron (ZOFRAN ODT) 4 MG disintegrating tablet Take 1-2 tablets (4-8 mg total) by mouth every 8 (eight) hours as needed for nausea or vomiting. 60 tablet 5  . risperiDONE (RISPERDAL) 1 MG tablet Take 1 tablet (1 mg total) by mouth at bedtime. 90 tablet 0  . HYDROcodone-acetaminophen (NORCO/VICODIN) 5-325 MG per tablet Take 1 tablet by mouth every 6 (six) hours. (Patient not taking: Reported on 10/20/2014) 120 tablet 0  . metoCLOPramide (REGLAN) 5 MG tablet Take 1 tablet (5 mg total) by mouth 4 (four) times daily -  before meals and at bedtime. (Patient not taking: Reported on 10/20/2014) 120 tablet 1   No current facility-administered medications on file prior to visit.   Allergies  Allergen Reactions  . Celebrex [Celecoxib] Nausea Only  . Morphine Itching    REACTION: itching  . Quetiapine     REACTION: tardive dyskinesia  . Varenicline Tartrate     REACTION: unspecified   Review of Systems  Constitutional: Positive for chills, diaphoresis, activity change, appetite change, fatigue and unexpected weight change. Negative for fever.  Gastrointestinal: Positive for nausea, vomiting and abdominal pain. Negative for constipation,  abdominal distention and anal bleeding.  Genitourinary: Negative for dysuria, urgency and decreased urine volume.  Allergic/Immunologic: Negative for immunocompromised state.  Neurological: Positive for dizziness, weakness, light-headedness and headaches.  Psychiatric/Behavioral: Positive for behavioral problems, sleep disturbance, dysphoric mood and decreased concentration. Negative for self-injury. The patient is nervous/anxious.      BP 118/74 mmHg  Pulse 84  Temp(Src) 97.7 F (36.5 C) (Oral)  Resp 16  Ht 5' 3.25" (1.607 m)  Wt 89 lb (40.37 kg)  BMI 15.63 kg/m2  SpO2 91%    Objective:   Physical Exam  Constitutional: She is oriented to person, place, and time. She appears well-developed and well-nourished. No distress.  HENT:  Head: Normocephalic and atraumatic.  Right Ear: External ear normal.  Eyes: Conjunctivae and EOM are normal. No scleral icterus.  Neck: Neck supple. No tracheal deviation present.  Cardiovascular: Normal rate.   Pulmonary/Chest: Effort normal. No respiratory distress.  Musculoskeletal: Normal range of motion.  Neurological: She is alert and oriented to person, place, and time.  Skin: Skin is warm and dry. She is not diaphoretic. No erythema.  Psychiatric: Her speech is normal and behavior is normal. Thought content is not paranoid. She exhibits a depressed mood. She expresses no suicidal ideation. She expresses no suicidal plans. She exhibits abnormal recent memory.  Nursing note and vitals reviewed.     Assessment & Plan:   1. B12 deficiency - pt received in distant past as well as parent received IM trx and helped a lot fo though b12 level was normal sev mos ago will do trial of once monthly IM injection for now and cont if pt has sx improvement - is  at risk of poor absorption w/ oral supp  2. Chronic pain syndrome - i agreed to start rxing pts opiates rather than referring to pain management as this is the thing that I suspect will keep her with reg  visits and thereby give [place] a chance to treat other co-morbidities and with other meds - increased mo dosing of #120/mo of hydrocodone 5mg  to #120/mo of hydrocodone 7.5 since she was using an average of 6 tabs/day of hydrocodone 5 this mo - when was only prescribed to use 4 tabs/d - encouraged pt to take sched qid - 30 min before meal and qhs along w/ reglan - we need to establish a baseline of pain control and proactively treating pain rather than waiting until she is miserable and then using high #s of meds at once to get relief.  Hopefully the scheduled dosing will also reduce the guilt and bickering she has when she does ask for a medicine. Reviewed controlled drug contract - if the pain medicine is not effective in decreasing pain than she needs to RTC to discuss this w/ me rather than using more than rx'ed as this would be a violation of our treatment plan.    3. Chronic abdominal pain   4. Protein-calorie malnutrition, severe - restart food diary - do not get stressed out by compliance/accuracy - I just want to get a general idea of how much she is eating and what type of foods.  Cont bid breeze protein just supp.  Pt  5.      Polypharmacy - Pt's weight and BMI are so low that I am concerned that she is needing such high med doses. However due to mult abd surgeries inc roux-en-y it is possible that she might not have full absorption of meds.  6.       Depression/anxiety - cont to wean off prozac which she has been on for 30 yrs so we can have pt on remeron instead - decrease prozac 40 to 20 - will likely stop at f/u in 1 mo.  Cont remeron 30 qhs and risperdal qhs but of which are new medications started in the past few mos - if insomnia cont could cons switching to seroquel rather than risperdal.  Refilled xanax - encouraged pt not to be negative about herself - she constantly is emotionally berating herself which is not helping her at all - try recording things she likes about herself.  Meds ordered  this encounter  Medications  . cyanocobalamin ((VITAMIN B-12)) injection 1,000 mcg    Sig:   . HYDROcodone-acetaminophen (NORCO) 7.5-325 MG per tablet    Sig: Take 1 tablet by mouth 4 (four) times daily.    Dispense:  120 tablet    Refill:  0  . ALPRAZolam (XANAX) 0.5 MG tablet    Sig: Take 1 tablet (0.5 mg total) by mouth 2 (two) times daily as needed. For anxiety    Dispense:  60 tablet    Refill:  0  . metoCLOPramide (REGLAN) 10 MG tablet    Sig: Take 1 tablet (10 mg total) by mouth 4 (four) times daily -  before meals and at bedtime. Take 30 minutes before meals    Dispense:  120 tablet    Refill:  0  . FLUoxetine (PROZAC) 20 MG tablet    Sig: Take 1 tablet (20 mg total) by mouth daily.    Dispense:  30 tablet    Refill:  0    I personally  performed the services described in this documentation, which was scribed in my presence. The recorded information has been reviewed and considered, and addended by me as needed.  [name], MD MPH

## 2014-10-21 ENCOUNTER — Telehealth: Payer: Self-pay

## 2014-10-21 NOTE — Telephone Encounter (Signed)
PA started for Fluoxetine 20mg 

## 2014-10-24 DIAGNOSIS — G894 Chronic pain syndrome: Secondary | ICD-10-CM | POA: Insufficient documentation

## 2014-10-25 NOTE — Telephone Encounter (Signed)
PA denied for Fluoxetine. I called pt to let her know. She states she was thinking Dr.Shaw was going to change this medication. They will only cover:  Citalopram Escitalopram Fluvoxamine Paroxetine Sertraline Venlafaxine

## 2014-10-25 NOTE — Telephone Encounter (Signed)
[name] - that is frustrating that they have been paying for the 40mg  fluoxetine forever but won't cover the 20mg  dose.  Will rx the 10mg  dose - maybe tha twill be covered.  If not then we will have to get her back on the 40 and have her break open the capsules into applesauce and we will have to wean her off that way.

## 2014-10-26 ENCOUNTER — Telehealth: Payer: Self-pay

## 2014-10-26 NOTE — Telephone Encounter (Signed)
error 

## 2014-10-26 NOTE — Telephone Encounter (Signed)
Attempted to call. No answer unable to leave message.

## 2014-10-26 NOTE — Telephone Encounter (Signed)
Pt missed her CB. Please return call. [phone]

## 2014-10-26 NOTE — Telephone Encounter (Signed)
Dr Brigitte Pulse- Do you mind calling pt? I tried to call her and explain to her your message about weaning off of the Prozac but she seemed to not "get" anything that i was saying, she wouldn't let me finish my sentences and all she talks about wanting is the nausea med.  Please advise. I don't know what to do!  Thanks!

## 2014-10-26 NOTE — Telephone Encounter (Signed)
She was talking with someone and her phone got disconnected. Please advise at [phone]

## 2014-10-28 ENCOUNTER — Telehealth: Payer: Self-pay

## 2014-10-28 MED ORDER — ONDANSETRON 4 MG PO TBDP: 4.0000 mg | ORAL_TABLET | Freq: Three times a day (TID) | ORAL | Status: DC | PRN

## 2014-10-28 MED ORDER — FLUOXETINE HCL 20 MG PO CAPS: 20.0000 mg | ORAL_CAPSULE | Freq: Every day | ORAL | Status: DC

## 2014-10-28 NOTE — Telephone Encounter (Signed)
Please advise Dr Brigitte Pulse.

## 2014-10-28 NOTE — Telephone Encounter (Signed)
Patient is calling because she is out of nausea pills and would like a refill sent to [facility] on [address]. She is also calling because she recently got a B 12 shot and wants to know how often she should have it done. Please call! [phone]

## 2014-10-28 NOTE — Telephone Encounter (Signed)
Called pt - advised that she can get vitamin b12 shots once a month.  She will return to clinic for her next at the end of May as she has had good improvement in energy after the last - is leaving the house more, able to get up and around - she would like to get them more frequently but doesn't think she could self-administer a shot -  Tried to with allergy shots previously which didn't work.  Agrees to let me draw her b12 level at the end of the month before her next IM inj. Pt has [facility]ies from [facility] to [facility] for most things so would like her zofran set to [facility] which I did. Pt is planning to wean of prozac - down to 40mg  but I prescribed her 20mg  and her insurance would not cover them - had to have a PA completed which was denied - however, it appears that I sent in the 20mg  TABLETS which are much more expensive than the capsules so hopefully switching to the capsules will be cost-effective.

## 2014-10-28 NOTE — Telephone Encounter (Signed)
Discussed w/ pt - see other duplicate phone note. zofran sent in.  Looks like prozac was rxed in tablets but may be cheaper in capsules so will try that instead.

## 2014-11-03 ENCOUNTER — Ambulatory Visit (INDEPENDENT_AMBULATORY_CARE_PROVIDER_SITE_OTHER): Payer: Medicare Other | Admitting: Family Medicine

## 2014-11-03 VITALS — BP 98/60 | HR 78 | Temp 98.5°F | Resp 17 | Ht 63.25 in | Wt 92.8 lb

## 2014-11-03 DIAGNOSIS — E43 Unspecified severe protein-calorie malnutrition: Secondary | ICD-10-CM | POA: Diagnosis not present

## 2014-11-03 DIAGNOSIS — G894 Chronic pain syndrome: Secondary | ICD-10-CM | POA: Diagnosis not present

## 2014-11-03 DIAGNOSIS — R109 Unspecified abdominal pain: Secondary | ICD-10-CM | POA: Diagnosis not present

## 2014-11-03 DIAGNOSIS — R634 Abnormal weight loss: Secondary | ICD-10-CM | POA: Diagnosis not present

## 2014-11-03 DIAGNOSIS — G8929 Other chronic pain: Secondary | ICD-10-CM

## 2014-11-03 DIAGNOSIS — J449 Chronic obstructive pulmonary disease, unspecified: Secondary | ICD-10-CM

## 2014-11-03 DIAGNOSIS — F411 Generalized anxiety disorder: Secondary | ICD-10-CM

## 2014-11-03 DIAGNOSIS — Z720 Tobacco use: Secondary | ICD-10-CM | POA: Diagnosis not present

## 2014-11-03 DIAGNOSIS — D509 Iron deficiency anemia, unspecified: Secondary | ICD-10-CM | POA: Diagnosis not present

## 2014-11-03 DIAGNOSIS — F331 Major depressive disorder, recurrent, moderate: Secondary | ICD-10-CM | POA: Diagnosis not present

## 2014-11-03 LAB — COMPREHENSIVE METABOLIC PANEL
ALBUMIN: 3.8 g/dL (ref 3.5–5.2)
ALT: 11 U/L (ref 0–35)
AST: 16 U/L (ref 0–37)
Alkaline Phosphatase: 102 U/L (ref 39–117)
BILIRUBIN TOTAL: 0.3 mg/dL (ref 0.2–1.2)
BUN: 13 mg/dL (ref 6–23)
CALCIUM: 8.7 mg/dL (ref 8.4–10.5)
CO2: 28 mEq/L (ref 19–32)
Chloride: 105 mEq/L (ref 96–112)
Creat: 0.87 mg/dL (ref 0.50–1.10)
Glucose, Bld: 77 mg/dL (ref 70–99)
POTASSIUM: 4.4 meq/L (ref 3.5–5.3)
SODIUM: 139 meq/L (ref 135–145)
Total Protein: 6.4 g/dL (ref 6.0–8.3)

## 2014-11-03 LAB — CBC
HCT: 40.3 % (ref 36.0–46.0)
HEMOGLOBIN: 13.1 g/dL (ref 12.0–15.0)
MCH: 29.6 pg (ref 26.0–34.0)
MCHC: 32.5 g/dL (ref 30.0–36.0)
MCV: 91 fL (ref 78.0–100.0)
MPV: 10.1 fL (ref 8.6–12.4)
Platelets: 331 10*3/uL (ref 150–400)
RBC: 4.43 MIL/uL (ref 3.87–5.11)
RDW: 14.6 % (ref 11.5–15.5)
WBC: 7.5 10*3/uL (ref 4.0–10.5)

## 2014-11-03 LAB — FERRITIN: Ferritin: 24 ng/mL (ref 10–291)

## 2014-11-03 LAB — VITAMIN B12: Vitamin B-12: 633 pg/mL (ref 211–911)

## 2014-11-03 MED ORDER — ALPRAZOLAM 0.5 MG PO TABS
0.5000 mg | ORAL_TABLET | Freq: Four times a day (QID) | ORAL | Status: DC | PRN
Start: 2014-11-03 — End: 2014-12-01

## 2014-11-03 MED ORDER — RISPERIDONE 2 MG PO TABS: 2.0000 mg | ORAL_TABLET | Freq: Every day | ORAL | Status: DC

## 2014-11-03 MED ORDER — OXYCODONE-ACETAMINOPHEN 5-325 MG PO TABS: 1.0000 | ORAL_TABLET | Freq: Four times a day (QID) | ORAL | Status: DC | PRN

## 2014-11-03 NOTE — Progress Notes (Addendum)
Subjective:  This chart was scribed for [name], by Tamsen Roers, at [facility].  This patient was seen in room 2 and the patient's care was started at 10:00 AM.   Chief Complaint  Patient presents with  . Follow-up      Patient ID: [name], female    DOB: 1951/04/27, 64 y.o.   MRN: [id number]  HPI  HPI Comments: [name] is a 64 y.o. female who presents to [facility] for a follow up.   Diet/ Abdominal Pain:  She has been writing in her journal and recording the things she has been eating.  She doesn't feel hungry very often but states that she is hungry right now in the office and is eating as often as she can.  She cant tell any difference from her increase in dosage in the Reglan.  She has been taking her hydrocodone 8 times a day and states that she has now run out.  She states that she has been taking double of what she is supposed to because she takes them for pain and at night time.  She cannot tell the difference from taking her Risperdal 1 mg. She would also like a refill for her xanax which she takes at night to sleep  Sleep:  She says that she is doing "ok" with her sleeping and has difficulty falling asleep at times.  She ranges from 8-10 hours of sleep daily and does not nap during the day.    Anxiety: She intermittently feels anxious during the day due to her health and her moms condition.  She has seen her mother 1 time recently. She has been getting out of the home rarely because it takes her too much effort to actually leave.  She has gone to the grocery store a couple of times and do other simple chores but this is it. 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  . ------- Has severe chronic abdominal pain for numerous years after multiple surgeries as well as severe protein calorie malnutrition which is combined with depression and polypharmacy.  She  haas a history of substance abuse.  For the past 5 years, she has been followed by her surgeon Dr. Hassell Done but he has informed her that there is nothing else he can do for her and has stopped prescribing her medication so she transferred her care here.  We have been giving her IV fluids once a month and last administered on 4-28 1 gram vitaman B-12 with good results. She is up at 92 pounds and that is the highest her weight has been since June 2015. She has gastroparesis and at last visit we increased her Reglan 5 to 10 QID.  It has helped and is still complaint with taking it.  She is using Zofran 4-6 times a day to control her nausea.  At last office visit, she had been instructed to use Hydrocodone 5 QID and I encouraged to take 7.5 but states her roommate was holding her hydrocodone and administering it to her as needed.  She often felt guilty for asking for it and had to fight to get it.  I encouraged her to take scheduled QID.  She has been on Prozac 40 for 30 years for her uncontrolled mood swings so we are  trying to get her down to 20 but having  trouble maintaining dose with insurance authorization.  Has started patient on Remeron and Seroquel.       Past Medical History  Diagnosis Date  . Peptic ulcer disease     dr Oletta Lamas  . Acute duodenal ulcer with hemorrhage and perforation, with obstruction   . Barrett's esophagus   . Menopause   . GERD (gastroesophageal reflux disease)   . Asthma   . Chronic abdominal pain     narcotic dependence, dr gyarteng-dak [facility]  . Narcotic abuse   . Anxiety     sees [name] NP at Dr. Radonna Ricker office  . Fx two ribs-open 08-24-11    left 4th and 5th  . Fx of fibula 07-28-11    left fibula, 2 places   . COPD (chronic obstructive pulmonary disease)   . Allergy   . Anemia   . History of blood transfusion     "related to OR; maybe when I perforated that ulcer"  . Lap Nissen + truncal vagotomy July 2008 05/13/2013  . LEG EDEMA 01/19/2008     Qualifier: Diagnosis of  By: [name] [name], [name]   . ACUTE DUODEN ULCER W/HEMORR PERF&OBSTRUCTION 06/26/2004    Qualifier: Diagnosis of  By: [name] [name], [name]   . FEVER, RECURRENT 08/11/2009    Qualifier: Diagnosis of  By: [name] [name], [name] MENOPAUSE 01/07/2007    Qualifier: Diagnosis of  By: [name], CMA, [place]    . Gastroparesis 06/03/2007    Qualifier: Diagnosis of  By: [name] [name], [name]   . GASTRIC OUTLET OBSTRUCTION 08/28/2007    In July 2008 she underwent laparoscopic enterolysis, Nissen fundoplication over a #35 bougie, single pledgeted suture colosure of the hiatus and a 3 suture wrap.  Lap truncal vagotomy and a loop gastrojejunostomy was performed.  This was revised in August of 2009 to a roux en Y gastrojejujostomy.     . 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 Depression   . History of hiatal hernia   . Complication of anesthesia     pt states had too much xanax on board - agitated   . Anginal pain     "many many years ago"  . Exertional shortness of breath   . Pneumonia, organism unspecified 11/07/2010    Assoc with R Parapneumonic effusion 09/2010    - Tapped 10/05/10    - CxR resolved 11/07/2010    . Headache     "monthly" (09/06/2014)  . Arthritis     "hips" (09/06/2014)    Current Outpatient Prescriptions on File Prior to Visit  Medication Sig Dispense Refill  . ALPRAZolam (XANAX) 0.5 MG tablet Take 1 tablet (0.5 mg total) by mouth 2 (two) times daily as needed. For anxiety 60 tablet 0  . aspirin EC 81 MG tablet Take 81 mg by mouth daily.    . feeding supplement, RESOURCE BREEZE, (RESOURCE BREEZE) LIQD Take 1 Container by mouth 3 (three) times daily between meals. 2 Container 0  . FLUoxetine (PROZAC) 20 MG capsule Take 1 capsule (20 mg total) by mouth daily. 30 capsule 0  . HYDROcodone-acetaminophen (NORCO) 7.5-325 MG per tablet Take 1 tablet by mouth 4 (four) times daily. 120 tablet 0  . metoCLOPramide (REGLAN) 10 MG tablet Take 1 tablet (10 mg  total) by mouth 4 (four) times daily -  before meals and at bedtime. Take 30 minutes before meals 120 tablet 0  . mirtazapine (REMERON) 30 MG tablet  Take 1 tablet (30 mg total) by mouth at bedtime. 90 tablet 0  . Multiple Vitamin (MULTIVITAMIN WITH MINERALS) TABS tablet Take 1 tablet by mouth daily.    . Multiple Vitamins-Minerals (OCUVITE PO) Take 1 tablet by mouth daily.    . Nutritional Supplements (BOOST BREEZE) LIQD Take 8 oz by mouth 3 (three) times daily. 90 Can 11  . omeprazole (PRILOSEC) 40 MG capsule Take 1 capsule (40 mg total) by mouth 2 (two) times daily. 180 capsule 3  . ondansetron (ZOFRAN ODT) 4 MG disintegrating tablet Take 1-2 tablets (4-8 mg total) by mouth every 8 (eight) hours as needed for nausea or vomiting. 60 tablet 5  . risperiDONE (RISPERDAL) 1 MG tablet Take 1 tablet (1 mg total) by mouth at bedtime. 90 tablet 0   Current Facility-Administered Medications on File Prior to Visit  Medication Dose Route Frequency Provider Last Rate Last Dose  . cyanocobalamin ((VITAMIN B-12)) injection 1,000 mcg  1,000 mcg Intramuscular Q30 days Shawnee Knapp, [name]   1,000 mcg at 10/20/14 1247    Allergies  Allergen Reactions  . Celebrex [Celecoxib] Nausea Only  . Morphine Itching    REACTION: itching  . Quetiapine     REACTION: tardive dyskinesia  . Varenicline Tartrate     REACTION: unspecified     Review of Systems  Constitutional: Negative for activity change and appetite change.       Objective:   Physical Exam  HENT:  Head: Normocephalic and atraumatic.  Eyes: Right eye exhibits no discharge. Left eye exhibits no discharge.  Neck: Normal range of motion. No thyromegaly present.  Cardiovascular: Normal rate.   Pulmonary/Chest: Effort normal and breath sounds normal. No respiratory distress. She has no wheezes.  Lymphadenopathy:    She has no cervical adenopathy.  Nursing note and vitals reviewed.   Filed Vitals:   11/03/14 0847  BP: 98/60  Pulse: 68  Temp: 98.5  F (36.9 C)  TempSrc: Oral  Resp: 17  Height: 5' 3.25" (1.607 m)  Weight: 92 lb 12.8 oz (42.094 kg)  SpO2: 87%     Assessment & Plan:  Keep track of when she is taking her medication in her journal Repeat B-12 in 2 weeks  1. Chronic obstructive pulmonary disease, unspecified COPD, unspecified chronic bronchitis type   2. Anxiety state   3. Major depressive disorder, recurrent episode, moderate   4. Chronic abdominal pain   5. Severe protein-calorie malnutrition   6. Tobacco abuse   7. Chronic pain syndrome   8. Weight loss   9. Anemia, iron deficiency     Orders Placed This Encounter  Procedures  . Vitamin B12  . Comprehensive metabolic panel  . CBC  . Ferritin    Meds ordered this encounter  Medications  . risperiDONE (RISPERDAL) 2 MG tablet    Sig: Take 1 tablet (2 mg total) by mouth at bedtime.    Dispense:  30 tablet    Refill:  2  . ALPRAZolam (XANAX) 0.5 MG tablet    Sig: Take 1 tablet (0.5 mg total) by mouth 4 (four) times daily as needed. For anxiety    Dispense:  120 tablet    Refill:  0  . DISCONTD: oxyCODONE-acetaminophen (ROXICET) 5-325 MG per tablet    Sig: Take 1 tablet by mouth every 6 (six) hours as needed for severe pain.    Dispense:  120 tablet    Refill:  0   Over 40 min spent in face-to-face evaluation of  and consultation with patient and coordination of care.  Over 50% of this time was spent counseling this patient.  I personally performed the services described in this documentation, which was scribed in my presence. The recorded information has been reviewed and considered, and addended by me as needed.  [name] Cheadle, [name] MPH

## 2014-11-03 NOTE — Patient Instructions (Addendum)
BRING ALL OF YOUR MEDICATIONS WITH YOU TO YOUR NEXT VISIT - IF YOU HAVE ANY OLD BOTTLES, DIFFERENT DOSES, EVERYTHING.  AS I HAVE BEEN TRYING TO WEAN YOUR OFF OF SOME THINGS, ONTO OTHERS YOU ARE GOING TO HAVE MULTIPLE DOSES - I WANT TO MAKE SURE I KNOW WHAT YOU ARE TAKING.  CONTINUE ON MIRTAZAPINE 30MG , INCREASE YOUR RISPERDAL TO 2MG  AT NIGHT.  IT LOOKS LIKE YOU HAVE ALREADY PICKED UP THE PROZAC 20MG  SO CONTINUE ON THAT ALONE FOR NOW.  OK TO CONT AS NEEDED XANAX - THIS WAS PRESCRIBED AT TWICE A DAY AND YOU HAVE BEEN TAKING AT FOUR TIMES A DAY SO I INCREASED THE FREQUENCY TO BE CONSISTENT WITH WHAT YOU ARE TAKING.  YOU HAVE BEEN TAKING 8 TABS OF HYDROCODONE 7.5MG  DAILY - THEREFORE, I WILL SWITCH YOU TO OXYCODONE 5MG  WHICH IS STRONGER THAN HYDROCODONE 7.5 MG - TRY TO TAKE THIS ONLY 4 TIMES A DAY - KEEP A LOG OF WHEN YOU TAKE YOUR MEDICATION AND YOUR PAIN - WHEN ARE YOU HAVING PAIN THAT YOU DON'T HAVE MEDICATION FOR?  WE ARE STILL TRYING TO FIND A REGIMEN THAT WORKS FOR YOU WHILE MAKING SURE YOU ARE NOT ON DANGEROUS LEVELS OF MEDICATION THAT COULD SUPPRESS YOUR BREATHING.   TAKE THE NUTRITIONAL SUPP DRINK IN BETWEEN MEALS, MAKE SURE YOU ARE HAVING A SNACK BEFORE BED.  I WANT YOU TO GET OUT OF THE HOUSE ON SOME ERRAND EVERY OTHER DAY.  CHECK OUT THE RED COLLECTION - THOSE PLACES ARE SO HUGE YOU CAN GET A LOT OF EXERCISE WALKING AROUND EITHER OF THEIR 2 LOCATIONS.

## 2014-11-05 ENCOUNTER — Ambulatory Visit (INDEPENDENT_AMBULATORY_CARE_PROVIDER_SITE_OTHER): Payer: Medicare Other | Admitting: Family Medicine

## 2014-11-05 VITALS — BP 90/60 | HR 76 | Temp 98.1°F | Ht 64.0 in | Wt 93.2 lb

## 2014-11-05 DIAGNOSIS — R634 Abnormal weight loss: Secondary | ICD-10-CM

## 2014-11-05 DIAGNOSIS — R109 Unspecified abdominal pain: Secondary | ICD-10-CM

## 2014-11-05 DIAGNOSIS — G894 Chronic pain syndrome: Secondary | ICD-10-CM

## 2014-11-05 DIAGNOSIS — F331 Major depressive disorder, recurrent, moderate: Secondary | ICD-10-CM | POA: Diagnosis not present

## 2014-11-05 DIAGNOSIS — G8929 Other chronic pain: Secondary | ICD-10-CM

## 2014-11-05 DIAGNOSIS — F411 Generalized anxiety disorder: Secondary | ICD-10-CM

## 2014-11-05 DIAGNOSIS — E43 Unspecified severe protein-calorie malnutrition: Secondary | ICD-10-CM

## 2014-11-05 MED ORDER — HYDROCODONE-ACETAMINOPHEN 10-325 MG PO TABS: 1.0000 | ORAL_TABLET | Freq: Four times a day (QID) | ORAL | Status: DC | PRN

## 2014-11-05 NOTE — Progress Notes (Signed)
Subjective:    Patient ID: [name], female    DOB: 23-Sep-1950, 64 y.o.   MRN: [id number] This chart was scribed for [name], MD by Marti Sleigh, Medical Scribe. This patient was seen in Room 13 and the patient's care was started a 9:49 AM.  Chief Complaint  Patient presents with  . Pain management issue    Oxycodone not helping-would like to go back to Hydrocodone    HPI Past Medical History  Diagnosis Date  . Peptic ulcer disease     dr Oletta Lamas  . Acute duodenal ulcer with hemorrhage and perforation, with obstruction   . Barrett's esophagus   . Menopause   . GERD (gastroesophageal reflux disease)   . Asthma   . Chronic abdominal pain     narcotic dependence, dr gyarteng-dak at [facility]  . Narcotic abuse   . Anxiety     sees [name] NP at Dr. Radonna Ricker office  . Fx two ribs-open 08-24-11    left 4th and 5th  . Fx of fibula 07-28-11    left fibula, 2 places   . COPD (chronic obstructive pulmonary disease)   . Allergy   . Anemia   . History of blood transfusion     "related to OR; maybe when I perforated that ulcer"  . Lap Nissen + truncal vagotomy July 2008 05/13/2013  . LEG EDEMA 01/19/2008    Qualifier: Diagnosis of  By: [name] MD, [name]   . ACUTE DUODEN ULCER W/HEMORR PERF&OBSTRUCTION 06/26/2004    Qualifier: Diagnosis of  By: [name] MD, [name]   . FEVER, RECURRENT 08/11/2009    Qualifier: Diagnosis of  By: [name] MD, [name] MENOPAUSE 01/07/2007    Qualifier: Diagnosis of  By: [name], CMA, [place]    . Gastroparesis 06/03/2007    Qualifier: Diagnosis of  By: [name] MD, [name]   . GASTRIC OUTLET OBSTRUCTION 08/28/2007    In July 2008 she underwent laparoscopic enterolysis, Nissen fundoplication over a #65 bougie, single pledgeted suture colosure of the hiatus and a 3 suture wrap.  Lap truncal vagotomy and a loop gastrojejunostomy was performed.  This was revised in August of 2009 to a roux en Y gastrojejujostomy.     . Chronic duodenal ulcer with  gastric outlet obstruction 06/29/2013  . Depression   . History of hiatal hernia   . Complication of anesthesia     pt states had too much xanax on board - agitated   . Anginal pain     "many many years ago"  . Exertional shortness of breath   . Pneumonia, organism unspecified 11/07/2010    Assoc with R Parapneumonic effusion 09/2010    - Tapped 10/05/10    - CxR resolved 11/07/2010    . Headache     "monthly" (09/06/2014)  . Arthritis     "hips" (09/06/2014)   Allergies  Allergen Reactions  . Celebrex [Celecoxib] Nausea Only  . Morphine Itching    REACTION: itching  . Quetiapine     REACTION: tardive dyskinesia  . Varenicline Tartrate     REACTION: unspecified   Current Outpatient Prescriptions on File Prior to Visit  Medication Sig Dispense Refill  . ALPRAZolam (XANAX) 0.5 MG tablet Take 1 tablet (0.5 mg total) by mouth 4 (four) times daily as needed. For anxiety 120 tablet 0  . aspirin EC 81 MG tablet Take 81 mg by mouth daily.    . feeding supplement, RESOURCE  BREEZE, (RESOURCE BREEZE) LIQD Take 1 Container by mouth 3 (three) times daily between meals. 2 Container 0  . FLUoxetine (PROZAC) 20 MG capsule Take 1 capsule (20 mg total) by mouth daily. 30 capsule 0  . metoCLOPramide (REGLAN) 10 MG tablet Take 1 tablet (10 mg total) by mouth 4 (four) times daily -  before meals and at bedtime. Take 30 minutes before meals 120 tablet 0  . mirtazapine (REMERON) 30 MG tablet Take 1 tablet (30 mg total) by mouth at bedtime. 90 tablet 0  . Multiple Vitamin (MULTIVITAMIN WITH MINERALS) TABS tablet Take 1 tablet by mouth daily.    . Multiple Vitamins-Minerals (OCUVITE PO) Take 1 tablet by mouth daily.    . Nutritional Supplements (BOOST BREEZE) LIQD Take 8 oz by mouth 3 (three) times daily. 90 Can 11  . omeprazole (PRILOSEC) 40 MG capsule Take 1 capsule (40 mg total) by mouth 2 (two) times daily. 180 capsule 3  . ondansetron (ZOFRAN ODT) 4 MG disintegrating tablet Take 1-2 tablets (4-8 mg total)  by mouth every 8 (eight) hours as needed for nausea or vomiting. 60 tablet 5  . oxyCODONE-acetaminophen (ROXICET) 5-325 MG per tablet Take 1 tablet by mouth every 6 (six) hours as needed for severe pain. 120 tablet 0  . risperiDONE (RISPERDAL) 2 MG tablet Take 1 tablet (2 mg total) by mouth at bedtime. 30 tablet 2   Current Facility-Administered Medications on File Prior to Visit  Medication Dose Route Frequency Provider Last Rate Last Dose  . cyanocobalamin ((VITAMIN B-12)) injection 1,000 mcg  1,000 mcg Intramuscular Q30 days Shawnee Knapp, MD   1,000 mcg at 10/20/14 1247   HPI Comments: [name] is a 64 y.o. female who presents to [facility] for a follow up appointment concerning pain management. Pt was seen two days ago for a follow up concerning pain management. Pt has chronic pain sx due to multiple abdominal surgeries resulting in protein and calorie malnutrition as well as anorexia. We had been having problems with her pain control, and the pt was repeatedly doubling her prescribed pain meds. At her last visit 2 days ago she was transitioned from hydrocodone 7.5 QID, to oxycodone 5 QID due to the fact that she had been repeatedly using two tabs of hydrocodone QID. Pt was asked to follow up within one week and bring all her medications due to the fact that we were changing her psychiatric medications and I was concerned that she might be mixing up her medications and dosages. Pt was also asked to keep a food journal and correlate it with her pain level and use of pain medications. She did achieve the highest weight she had in well over a year at last visit, and at today's visit her weight continues to improve.   Pt states her oxycodone prescription is not working at all, and in no way helps control her pain. She was dispensed 120 pills two days ago, but did not bring the medication in to the office today. Pt has 15 pills loose in her purse. I proposed that I take and dispose of those pills, and would  be willing to write her hydrocodone 10 once that medication is returned. However pt refused, because pt was worried that she would not be able to control her pain for the hour or two on the way of her pharmacy. Pt states she is going to return her medication to her roommate who is a patient of mine, and currently has a  severe outbreak of shingles and is currently prescribed this medication. Pt clearly distressed, crying and ataxic. Pt was advised that in order to receive a new prescription she will need to trade her current medication. Pt advised to bring in her oxycodone to a nurse at [facility] who will do a pill count and document and dispose of this the medication.   Review of Systems  Constitutional: Positive for activity change and fatigue. Negative for fever, chills and unexpected weight change.  Gastrointestinal: Positive for nausea and abdominal pain.  Psychiatric/Behavioral: Positive for behavioral problems, confusion, sleep disturbance, dysphoric mood and agitation. Negative for suicidal ideas and self-injury.       Objective:   Filed Vitals:   11/05/14 0904  BP: 90/60  Pulse: 76  Temp: 98.1 F (36.7 C)  TempSrc: Oral  Height: 5\' 4"  (1.626 m)  Weight: 93 lb 4 oz (42.298 kg)  SpO2: 92%     Physical Exam  Constitutional: She is oriented to person, place, and time. She appears well-developed and well-nourished. No distress.  HENT:  Head: Normocephalic and atraumatic.  Eyes: Pupils are equal, round, and reactive to light.  Neck: Neck supple.  Cardiovascular: Normal rate.   Pulmonary/Chest: Effort normal. No respiratory distress.  Musculoskeletal: Normal range of motion.  Neurological: She is alert and oriented to person, place, and time. Coordination normal.  Skin: Skin is warm and dry. She is not diaphoretic.  Psychiatric:  Pt is agitated, rushed speech, tearful  Nursing note and vitals reviewed.      Assessment & Plan:  Chronic pain syndrome Chronic abdominal  pain Severe protein-calorie malnutrition Debility Anxiety  Meds ordered this encounter  Medications  . HYDROcodone-acetaminophen (NORCO) 10-325 MG per tablet    Sig: Take 1 tablet by mouth every 6 (six) hours as needed.    Dispense:  120 tablet    Refill:  0    Ok to fill today, pt will appropriately dispense of her oxycodone and is aware not to combine.    I personally performed the services described in this documentation, which was scribed in my presence. The recorded information has been reviewed and considered, and addended by me as needed.  [name], MD MPH

## 2014-11-05 NOTE — Patient Instructions (Signed)
In the future, if a medication does not work for you, you need to bring back your current bottle - the normal protocol requires [place] to count and dispense of your pills before issuing a new prescription - I know that this is expensive and so going forward we can always have the pharmacist dispense just a few of a new medication to ensure it works before you purchase a full month's worth.  Remember at your next visit that I want you to bring EVERY medication and EVERY medication bottle in your house that has your name on it.  1. You will need to be seen for an office visit every month.  Hopefully, all prescriptions can be issued at that time so that you do not need to call in for refill requests.  After you are stable, we may be able to decrease the frequency of your exams. 2.  If for some reason, you are not issued all of prescriptions at a visit, please allow a 72 hour or 3 day window to respond to your refill requests. This is especially important if you need to pick up a prescription over the weekend, so make sure you call during the week prior (e.g. Call by Thursday if you want to be able to pick up a prescription by Sunday). 3. You may only fill your prescriptions at one pharmacy. If you are traveling out of town for some reason during a time that you will need a refill filled, I request that you notify our office and stick to one chain - such was [facility], [facility], [facility], etc. 4. You may not receive controlled substances from any other provider. If there is an acute condition for which you are seen elsewhere (e.g. you go to the ER after a car accident), please alert the provider that you are on a controlled substance contract and if you are still prescribed a controlled substance, you alert our office within 24 hours of filling it.   5. If you are planning to be seen at our [facility] walk-in clinic for refills, please call ahead of time to confirm that I will be working and please arrive 2 hours prior to the  end of my shift to guarantee that you will be seen by myself.  If you are seen at our office for another reason or I am out of the office for an extended period, some providers may be willing to provide a refill on your medications but others are not. It is left up to each provider's discretion at the time of the visit.   6. Please treat our staff with the respect with which you would like to be treated. I know that our medical system can be frustrating but they are doing their job to the best of their abilities as they were instructed.

## 2014-11-19 ENCOUNTER — Encounter: Payer: Self-pay | Admitting: Family Medicine

## 2014-11-19 DIAGNOSIS — B977 Papillomavirus as the cause of diseases classified elsewhere: Secondary | ICD-10-CM | POA: Insufficient documentation

## 2014-11-20 ENCOUNTER — Ambulatory Visit (INDEPENDENT_AMBULATORY_CARE_PROVIDER_SITE_OTHER): Payer: Medicare Other | Admitting: Family Medicine

## 2014-11-20 VITALS — BP 102/72 | HR 92 | Temp 98.7°F | Resp 17 | Ht 64.0 in | Wt 99.0 lb

## 2014-11-20 DIAGNOSIS — Z9884 Bariatric surgery status: Secondary | ICD-10-CM

## 2014-11-20 DIAGNOSIS — R109 Unspecified abdominal pain: Secondary | ICD-10-CM | POA: Diagnosis not present

## 2014-11-20 DIAGNOSIS — R609 Edema, unspecified: Secondary | ICD-10-CM | POA: Diagnosis not present

## 2014-11-20 DIAGNOSIS — E43 Unspecified severe protein-calorie malnutrition: Secondary | ICD-10-CM | POA: Diagnosis not present

## 2014-11-20 DIAGNOSIS — J449 Chronic obstructive pulmonary disease, unspecified: Secondary | ICD-10-CM

## 2014-11-20 DIAGNOSIS — Z720 Tobacco use: Secondary | ICD-10-CM

## 2014-11-20 DIAGNOSIS — F331 Major depressive disorder, recurrent, moderate: Secondary | ICD-10-CM

## 2014-11-20 DIAGNOSIS — F411 Generalized anxiety disorder: Secondary | ICD-10-CM

## 2014-11-20 DIAGNOSIS — G894 Chronic pain syndrome: Secondary | ICD-10-CM

## 2014-11-20 DIAGNOSIS — D649 Anemia, unspecified: Secondary | ICD-10-CM | POA: Diagnosis not present

## 2014-11-20 DIAGNOSIS — R634 Abnormal weight loss: Secondary | ICD-10-CM | POA: Diagnosis not present

## 2014-11-20 DIAGNOSIS — G8929 Other chronic pain: Secondary | ICD-10-CM

## 2014-11-20 DIAGNOSIS — K5909 Other constipation: Secondary | ICD-10-CM

## 2014-11-20 DIAGNOSIS — K5900 Constipation, unspecified: Secondary | ICD-10-CM

## 2014-11-20 DIAGNOSIS — R0689 Other abnormalities of breathing: Secondary | ICD-10-CM | POA: Diagnosis not present

## 2014-11-20 LAB — POCT URINALYSIS DIPSTICK
BILIRUBIN UA: NEGATIVE
Blood, UA: NEGATIVE
Glucose, UA: NEGATIVE
KETONES UA: NEGATIVE
Nitrite, UA: NEGATIVE
PH UA: 5.5
Protein, UA: NEGATIVE
SPEC GRAV UA: 1.015
Urobilinogen, UA: 1

## 2014-11-20 LAB — POCT UA - MICROSCOPIC ONLY
CASTS, UR, LPF, POC: NEGATIVE
Crystals, Ur, HPF, POC: NEGATIVE
Mucus, UA: NEGATIVE
Yeast, UA: NEGATIVE

## 2014-11-20 LAB — C-REACTIVE PROTEIN: CRP: 0.6 mg/dL — AB (ref <0.60)

## 2014-11-20 LAB — COMPLETE METABOLIC PANEL WITH GFR
ALT: 10 U/L (ref 0–35)
AST: 15 U/L (ref 0–37)
Albumin: 2.7 g/dL — ABNORMAL LOW (ref 3.5–5.2)
Alkaline Phosphatase: 102 U/L (ref 39–117)
BUN: 16 mg/dL (ref 6–23)
CALCIUM: 7.7 mg/dL — AB (ref 8.4–10.5)
CO2: 27 mEq/L (ref 19–32)
Chloride: 106 mEq/L (ref 96–112)
Creat: 0.58 mg/dL (ref 0.50–1.10)
GFR, Est African American: >89 mL/min
GFR, Est Non African American: >89 mL/min
GLUCOSE: 81 mg/dL (ref 70–99)
Potassium: 4.8 mEq/L (ref 3.5–5.3)
Sodium: 138 mEq/L (ref 135–145)
Total Bilirubin: 0.3 mg/dL (ref 0.2–1.2)
Total Protein: 4.6 g/dL — ABNORMAL LOW (ref 6.0–8.3)

## 2014-11-20 LAB — POCT CBC
Granulocyte percent: 72.5 %G (ref 37–80)
HCT, POC: 33.9 % — AB (ref 37.7–47.9)
HEMOGLOBIN: 10.6 g/dL — AB (ref 12.2–16.2)
LYMPH, POC: 1.7 (ref 0.6–3.4)
MCH, POC: 29.9 pg (ref 27–31.2)
MCHC: 31.4 g/dL — AB (ref 31.8–35.4)
MCV: 95.5 fL (ref 80–97)
MID (cbc): 0.7 (ref 0–0.9)
MPV: 7.5 fL (ref 0–99.8)
POC Granulocyte: 6.3 (ref 2–6.9)
POC LYMPH PERCENT: 19.9 %L (ref 10–50)
POC MID %: 7.6 %M (ref 0–12)
Platelet Count, POC: 430 10*3/uL — AB (ref 142–424)
RBC: 3.55 M/uL — AB (ref 4.04–5.48)
RDW, POC: 18.6 %
WBC: 8.7 10*3/uL (ref 4.6–10.2)

## 2014-11-20 LAB — POCT SEDIMENTATION RATE: POCT SED RATE: 5 mm/hr (ref 0–22)

## 2014-11-20 LAB — TSH: TSH: 1.866 u[IU]/mL (ref 0.350–4.500)

## 2014-11-20 MED ORDER — HYDROCODONE-ACETAMINOPHEN 10-325 MG PO TABS
1.0000 | ORAL_TABLET | ORAL | Status: DC | PRN
Start: 2014-11-20 — End: 2014-12-01

## 2014-11-20 MED ORDER — FUROSEMIDE 20 MG PO TABS: 20.0000 mg | ORAL_TABLET | Freq: Two times a day (BID) | ORAL | Status: DC

## 2014-11-20 MED ORDER — DULOXETINE HCL 20 MG PO CPEP: 20.0000 mg | ORAL_CAPSULE | Freq: Every day | ORAL | Status: DC

## 2014-11-20 NOTE — Progress Notes (Signed)
Subjective:  This chart was scribed for [name], MD, by Starleen Arms, Medical Scribe. This patient was seen in room 1 and the patient's care was started at 10:32 AM.   Patient ID: [name], female    DOB: Dec 15, 1950, 64 y.o.   MRN: [id number] Chief Complaint  Patient presents with  . Edema    in both legs and feet since yesterday    HPI HPI Comments: [name] is a 64 y.o. female who was seen 2 weeks ago.  She has a hsitory of severe chronic abdominal pain after numerous surgeries resulting in severe malnutrition and anorexia.  This seems to have been complicated by a fair amount of depression and anxiety. She has been in and out of pain management for years and had been largely followed by her surgeon until 6 months ago when a second percutaneous G-tube was placed for bolus feeding nutrition in an outpatient procedure and it fell out within 2 days due to cachexia.  Surgeon felt that there were no other options or any future indications for repeating this again in the future.  She began seeing me as her PCP and I referred her to pain management.  However, I began seeing her frequently to to also monitor her nutrition, weight, and mental health and so agreed to provide her pain management at least initially as this was tied into so many of her other comorbidities.  Unfortunately, patient has been very noncompliant with her narcotic use and has been consistently doubling any prescribed doses.  I see her roommate as a patient as well.  Patient states that her roommate [name] keeps her narcotic medication and dispenses it out to her, so [name] has very poor awareness of how much or how frequently she is using.    She presents to [facility] complaining of worsening swelling in the bilateral legs, around her eyes, and in her bilateral hands onset yesterday with associated mild tingling numbness in the feet worse in left.  She does reports she was on her feet for a long period of time yesterday but  elevated her legs overnight.  She weighed herself this morning at 100 lbs. Patient has gained 6 lbs in the prior 2 weeks and gained 4 lbs in the two weeks prior to that.  She denies new medications including OTC medications but does take an unknown number of Goody's Powder on a non-daily basis because they "calm me down."  She denies recollection of a bug bite.  She denies use of seasonal allergies.  She denies trouble sleeping, SOB, CP, dizziness, lightheadedness, fever, chills, palpitations, bloating, urinary/bowel abnormalities.    Although she reports using it religiously, she did not bring the journal it was suggested she keep where she should document her food usage correlated to her pain level.  She was also previously advised to document her pain medication usage, which she reports doing, but did not bring the journal or her medications.  She continues to take her medications beyond the prescribed dosage and her roommate is dispensing her medication.    Regarding her mood the patient states she feels "depressed a lot, I guess" but "I just recently started to feel better."  She views her recent weight gain as a positive and is slightly perturbed by the leg swelling, seeing it as an impediment to her improving mood.     Past Medical History  Diagnosis Date  . Peptic ulcer disease     dr Oletta Lamas  .  Acute duodenal ulcer with hemorrhage and perforation, with obstruction   . Barrett's esophagus   . Menopause   . GERD (gastroesophageal reflux disease)   . Asthma   . Chronic abdominal pain     narcotic dependence, dr gyarteng-dak at [facility]  . Narcotic abuse   . Anxiety     sees [name] NP at Dr. Radonna Ricker office  . Fx two ribs-open 08-24-11    left 4th and 5th  . Fx of fibula 07-28-11    left fibula, 2 places   . COPD (chronic obstructive pulmonary disease)   . Allergy   . Anemia   . History of blood transfusion     "related to OR; maybe when I perforated that ulcer"  .  Lap Nissen + truncal vagotomy July 2008 05/13/2013  . LEG EDEMA 01/19/2008    Qualifier: Diagnosis of  By: [name] MD, [name]   . ACUTE DUODEN ULCER W/HEMORR PERF&OBSTRUCTION 06/26/2004    Qualifier: Diagnosis of  By: [name] MD, [name]   . FEVER, RECURRENT 08/11/2009    Qualifier: Diagnosis of  By: [name] MD, [name] MENOPAUSE 01/07/2007    Qualifier: Diagnosis of  By: [name], CMA, [place]    . Gastroparesis 06/03/2007    Qualifier: Diagnosis of  By: [name] MD, [name]   . GASTRIC OUTLET OBSTRUCTION 08/28/2007    In July 2008 she underwent laparoscopic enterolysis, Nissen fundoplication over a #33 bougie, single pledgeted suture colosure of the hiatus and a 3 suture wrap.  Lap truncal vagotomy and a loop gastrojejunostomy was performed.  This was revised in August of 2009 to a roux en Y gastrojejujostomy.     . 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 Depression   . History of hiatal hernia   . Complication of anesthesia     pt states had too much xanax on board - agitated   . Anginal pain     "many many years ago"  . Exertional shortness of breath   . Pneumonia, organism unspecified 11/07/2010    Assoc with R Parapneumonic effusion 09/2010    - Tapped 10/05/10    - CxR resolved 11/07/2010    . Headache     "monthly" (09/06/2014)  . Arthritis     "hips" (09/06/2014)   Current Outpatient Prescriptions on File Prior to Visit  Medication Sig Dispense Refill  . ALPRAZolam (XANAX) 0.5 MG tablet Take 1 tablet (0.5 mg total) by mouth 4 (four) times daily as needed. For anxiety 120 tablet 0  . aspirin EC 81 MG tablet Take 81 mg by mouth daily.    . feeding supplement, RESOURCE BREEZE, (RESOURCE BREEZE) LIQD Take 1 Container by mouth 3 (three) times daily between meals. 2 Container 0  . mirtazapine (REMERON) 30 MG tablet Take 1 tablet (30 mg total) by mouth at bedtime. 90 tablet 0  . Multiple Vitamin (MULTIVITAMIN WITH MINERALS) TABS tablet Take 1 tablet by mouth daily.    .  Multiple Vitamins-Minerals (OCUVITE PO) Take 1 tablet by mouth daily.    . Nutritional Supplements (BOOST BREEZE) LIQD Take 8 oz by mouth 3 (three) times daily. 90 Can 11  . omeprazole (PRILOSEC) 40 MG capsule Take 1 capsule (40 mg total) by mouth 2 (two) times daily. 180 capsule 3  . ondansetron (ZOFRAN ODT) 4 MG disintegrating tablet Take 1-2 tablets (4-8 mg total) by mouth every 8 (eight) hours as needed for nausea or vomiting. [place]  tablet 5  . risperiDONE (RISPERDAL) 2 MG tablet Take 1 tablet (2 mg total) by mouth at bedtime. 30 tablet 2   Current Facility-Administered Medications on File Prior to Visit  Medication Dose Route Frequency Provider Last Rate Last Dose  . cyanocobalamin ((VITAMIN B-12)) injection 1,000 mcg  1,000 mcg Intramuscular Q30 days Shawnee Knapp, MD   1,000 mcg at 10/20/14 1247   Allergies  Allergen Reactions  . Celebrex [Celecoxib] Nausea Only  . Morphine Itching    REACTION: itching  . Quetiapine     REACTION: tardive dyskinesia  . Varenicline Tartrate     REACTION: unspecified       Review of Systems  Constitutional: Negative for fever, activity change, appetite change and fatigue.  Respiratory: Negative for shortness of breath.   Cardiovascular: Positive for leg swelling. Negative for chest pain.  Neurological: Negative for dizziness and light-headedness.       Objective:  BP 102/72 mmHg  Pulse 92  Temp(Src) 98.7 F (37.1 C) (Oral)  Resp 17  Ht 5\' 4"  (1.626 m)  Wt 99 lb (44.906 kg)  BMI 16.98 kg/m2  SpO2 91%  Physical Exam  Constitutional: She is oriented to person, place, and time. She appears well-developed and well-nourished. No distress.  HENT:  Head: Normocephalic and atraumatic.  Eyes: Conjunctivae and EOM are normal.  Neck: Neck supple. No tracheal deviation present.  Cardiovascular: Normal rate and regular rhythm.   Normal S1/S2; no murmur.  Pulmonary/Chest: Effort normal. No respiratory distress.  Lungs CTA.  Abdominal:  Hyperactive  bowel sounds.  Abdomen with chronic bruising and numerous scars.    Musculoskeletal: Normal range of motion.  Left leg is 4 cm proximal from medial malleolus is 7 and 5/8s and 7.5" on right .  No calf tendernenss negative Homan's.  No chords.  No bruising.  2+ DP/PT pulses bilaterally.  1+ pitting edema on right 2+ on left.  No sacral pitting.  Erythema, blanching and warmth over neck.  Periorbital edema.    Neurological: She is alert and oriented to person, place, and time.  Skin: Skin is warm and dry.  Psychiatric: She has a normal mood and affect. Her behavior is normal.  Nursing note and vitals reviewed.  Results for orders placed or performed in visit on 11/20/14  POCT UA - Microscopic Only  Result Value Ref Range   WBC, Ur, HPF, POC 2-3    RBC, urine, microscopic 0-2    Bacteria, U Microscopic trace    Mucus, UA neg    Epithelial cells, urine per micros 1-3    Crystals, Ur, HPF, POC neg    Casts, Ur, LPF, POC neg    Yeast, UA neg   POCT urinalysis dipstick  Result Value Ref Range   Color, UA yellow    Clarity, UA clear    Glucose, UA neg    Bilirubin, UA neg    Ketones, UA neg    Spec Grav, UA 1.015    Blood, UA neg    pH, UA 5.5    Protein, UA neg    Urobilinogen, UA 1.0    Nitrite, UA neg    Leukocytes, UA Trace   POCT CBC  Result Value Ref Range   WBC 8.7 4.6 - 10.2 K/uL   Lymph, poc 1.7 0.6 - 3.4   POC LYMPH PERCENT 19.9 10 - 50 %L   MID (cbc) 0.7 0 - 0.9   POC MID % 7.6 0 - 12 %M  POC Granulocyte 6.3 2 - 6.9   Granulocyte percent 72.5 37 - 80 %G   RBC 3.55 (A) 4.04 - 5.48 M/uL   Hemoglobin 10.6 (A) 12.2 - 16.2 g/dL   HCT, POC 33.9 (A) 37.7 - 47.9 %   MCV 95.5 80 - 97 fL   MCH, POC 29.9 27 - 31.2 pg   MCHC 31.4 (A) 31.8 - 35.4 g/dL   RDW, POC 18.6 %   Platelet Count, POC 430 (A) 142 - 424 K/uL   MPV 7.5 0 - 99.8 fL        Assessment & Plan:  11:05 AM Discussed with patient the need for the patient to resume control of dispensing her own medication.   Will also order labs and prescribe diuretic.  Patient should f/u in 4 days at which time she should bring all her medications and food journal..  Patient acknowledges and agrees with plan.    1. Edema - suspect due to low protein - restart breeze protein supp drinks, reviewed high protein diet, start lasix, recheck in 4d.  2. Chronic pain syndrome - pt is needing high doses of hydrocodone for pain control - I am concerned about the amount of tylenol she is getting in with this regimen which needs to be lower. However, she maxed out on the 10mg  hydrocodone dose and cannot tolerate oxycodone - is going to be dependent upon narcotic therapy due to numerous abdominal surgeries and digestive problems - she would likely benefit from a longer acting med such as opana or patch but would like pain management to treat this due to my lack of experience with these stronger medicaitons - refer to [facility] as most convenient for pt.  Refilled hydrocodone for 4d - pt repeatedly instructed to bring all of her medication bottles as well as her med/diet log for my review.  3. Chronic abdominal pain   4. Weight loss   5. Tobacco abuse   6. Severe protein-calorie malnutrition   7. Major depressive disorder, recurrent episode, moderate - actually doing quite well as she has come off prozac so stop 20mg  and start cymbalta  8. History of Roux-en-Y gastric bypass   9. Anxiety state   10. Chronic obstructive pulmonary disease, unspecified COPD, unspecified chronic bronchitis type   11. Constipation, chronic   12. Anemia, unspecified anemia type  - recurred and worsening, home hemoccult and recheck in 4d    Orders Placed This Encounter  Procedures  . TSH  . C-reactive protein  . COMPLETE METABOLIC PANEL WITH GFR  . Pro b natriuretic peptide  . Prealbumin  . POCT UA - Microscopic Only  . POCT urinalysis dipstick  . POCT CBC  . POCT SEDIMENTATION RATE    Meds ordered this encounter  Medications  . DULoxetine  (CYMBALTA) 20 MG capsule    Sig: Take 1 capsule (20 mg total) by mouth daily.    Dispense:  30 capsule    Refill:  0  . HYDROcodone-acetaminophen (NORCO) 10-325 MG per tablet    Sig: Take 1 tablet by mouth every 4 (four) hours as needed.    Dispense:  30 tablet    Refill:  0  . furosemide (LASIX) 20 MG tablet    Sig: Take 1 tablet (20 mg total) by mouth 2 (two) times daily.    Dispense:  30 tablet    Refill:  0   Over 60 min spent in face-to-face evaluation of and consultation with patient and coordination of care.  Over 50% of this time was spent counseling this patient.  I personally performed the services described in this documentation, which was scribed in my presence. The recorded information has been reviewed and considered, and addended by me as needed.  [name], MD MPH

## 2014-11-20 NOTE — Patient Instructions (Addendum)
You need to keep all of your pain medicine.  You need to take it as it is prescribed on the bottle.  You need to document when you are using it and why (e.g. Abdominal pain, 7/10;   Headache 4/10), etc Recheck with me on Thursday before noon.   High Protein, High Calorie Diet A high protein, high calorie diet increases the amount of protein and calories you eat. You may need more protein and calories in your diet because of illness, surgery, injury, weight loss, or having a poor appetite. Eating high protein and high calorie foods can help you gain weight, heal, and recover after illness.  SERVING SIZES Measuring foods and serving sizes helps to make sure you are getting the right amount of food. The list below tells how big or small some common serving sizes are.   1 oz.........4 stacked dice.  3 oz........Marland KitchenDeck of cards.  1 tsp.......Marland KitchenTip of little finger.  1 tbs......Marland KitchenMarland KitchenThumb.  2 tbs.......Marland KitchenGolf ball.   cup......Marland KitchenHalf of a fist.  1 cup.......Marland KitchenA fist. HIGH PROTEIN FOODS Dairy  Whole milk.  Whole milk yogurt.  Powdered milk.  Cheese.  Yahoo.  Instant breakfast products.  Eggnog. Tips for adding to your diet:  Use whole milk when making hot cereal, puddings, soups, and hot cocoa.  Add powdered milk to baked goods, smoothies, and milkshakes.  Make whole milk yogurt parfaits by adding granola, fruit, or nuts.  Add cheese to sandwiches, pastas, soups, and casseroles.  Add fruit to cottage cheese. Meat   Beef, pork, and poultry.  Fish and seafood.  Peanut butter.  Dried beans.  Eggs. Tips for adding to your diet:  Make meat and cheese omelets.  Add eggs to salads and baked goods.  Add fish and seafood to salads.  Add meat and poultry to casseroles, salads, and soups.  Use peanut butter as a topping for pretzels, celery, crackers, or add it to baked goods.  Use beans in casseroles, dips, and spreads. GENERAL GUIDELINES TO INCREASE  CALORIES  Replace calorie-free drinks with calorie-containing drinks, such as milk, fruit juices, regular soda, milkshakes, and hot chocolate.  Try to eat 6 small meals instead of 3 large meals each day.  Keep snacks handy, such as nuts, trail mixes, dried fruit, and yogurt.  Choose foods with sauces and gravies.  Add dried fruits, honey, and half-and-half to hot or cold cereal.  Add extra fats when possible, such as butter, sour cream, cream cheese, and salad dressings.  Add cheese to foods often.  Consider adding a clear liquid nutritional supplement to your diet. Your caregiver can give you recommendations. HIGH CALORIE FOODS Grain/Starch  Baked goods, such as muffins and quick breads.  Croissants.  Pancakes and waffles. Vegetable   Sauted vegetables in oil.  Fried vegetables.  Salad greens with regular salad dressing or vinegar and oil. Fruit  Dried fruit.  Canned fruit in syrup.  Fruit juice. Fat  Avocado.  Butter or margarine.  Whipped cream.  Mayonnaise.  Salad dressing.  Peanuts and mixed nuts.  Cream cheese and sour cream. Sweets and Dessert  Cake.  Cookies.  Pie.  Ice cream.  Doughnuts and pastries.  Protein and meal replacement bars.  Jam, preserves, and jelly.  Candy bars.  Chocolate.  Chocolate, caramel, or other flavored syrups. Document Released: 06/10/2005 Document Revised: 09/02/2011 Document Reviewed: 03/13/2007 [facility] Patient Information 2015 [place], [place]. This information is not intended to replace advice given to you by your health care provider. Make sure you  discuss any questions you have with your health care provider. High Protein Diet A high protein diet means that high protein foods are added to your diet. Getting more protein in the diet is important for a number of reasons. Protein helps the body to build tissue, muscle, and to repair damage. People who have had surgery, injuries such as broken bones,  infections, and burns, or illnesses such as cancer, may need more protein in their diet.  SERVING SIZES Measuring foods and serving sizes helps to make sure you are getting the right amount of food. The list below tells how big or small some common serving sizes are.   1 oz.........4 stacked dice.  3 oz........Marland KitchenDeck of cards.  1 tsp.......Marland KitchenTip of little finger.  1 tbs......Marland KitchenMarland KitchenThumb.  2 tbs.......Marland KitchenGolf ball.   cup......Marland KitchenHalf of a fist.  1 cup.......Marland KitchenA fist. FOOD SOURCES OF PROTEIN Listed below are some food sources of protein and the amount of protein they contain. Your Registered Dietitian can calculate how many grams of protein you need for your medical condition. High protein foods can be added to the diet at mealtime or as snacks. Be sure to have at least 1 protein-containing food at each meal and snack to ensure adequate intake.  Meats and Meat Substitutes / Protein (g)  3 oz poultry (chicken, [place]) / 26 g  3 oz tuna, canned in water / 26 g  3 oz fish (cod) / 21 g  3 oz red meat (beef, pork) / 21 g  4 oz tofu / 9 g  1 egg / 6 g   cup egg substitute / 5 g  1 cup dried beans / 15 g  1 cup soy milk / 4 g Dairy / Protein (g)  1 cup milk (skim, 1%, 2%, whole) / 8 g   cup evaporated milk / 9 g  1 cup buttermilk / 8 g  1 cup low-fat plain yogurt / 11 g  1 cup regular plain yogurt / 9 g   cup cottage cheese / 14 g  1 oz cheddar cheese / 7 g Nuts / Protein (g)  2 tbs peanut butter / 8 g  1 oz peanuts / 7 g  2 tbs cashews / 5 g  2 tbs almonds / 5 g Document Released: 06/10/2005 Document Revised: 09/02/2011 Document Reviewed: 03/13/2007 [employer] Patient Information 2015 [place], [place]. This information is not intended to replace advice given to you by your health care provider. Make sure you discuss any questions you have with your health care provider.

## 2014-11-21 ENCOUNTER — Encounter: Payer: Self-pay | Admitting: Family Medicine

## 2014-11-21 LAB — PRO B NATRIURETIC PEPTIDE: PRO B NATRI PEPTIDE: 545 pg/mL — AB (ref <126)

## 2014-11-22 ENCOUNTER — Encounter: Payer: Self-pay | Admitting: Family Medicine

## 2014-11-22 LAB — PREALBUMIN: PREALBUMIN: 21 mg/dL (ref 17–34)

## 2014-11-23 DIAGNOSIS — W19XXXA Unspecified fall, initial encounter: Secondary | ICD-10-CM

## 2014-11-23 HISTORY — DX: Unspecified fall, initial encounter: W19.XXXA

## 2014-11-24 ENCOUNTER — Ambulatory Visit: Payer: Medicare Other

## 2014-11-29 ENCOUNTER — Ambulatory Visit (INDEPENDENT_AMBULATORY_CARE_PROVIDER_SITE_OTHER): Payer: Medicare Other

## 2014-11-30 ENCOUNTER — Telehealth: Payer: Self-pay

## 2014-11-30 NOTE — Telephone Encounter (Signed)
Pt was given a refill from Dr. Brigitte Pulse for her hydrocodone that she was supposed to have filled today.  She says she thinks she left the script with the pharmacy, but they do not have it and she cant find it.  She would like to have another one written asap.  [phone]

## 2014-11-30 NOTE — Telephone Encounter (Signed)
Needs to come in to be seen.  And bring all of her medications with her.  Before 12 on Thursday 6/9 or on Sunday

## 2014-11-30 NOTE — Telephone Encounter (Signed)
Patient states she missed a phone call. Please call!

## 2014-11-30 NOTE — Telephone Encounter (Signed)
Patient is calling because she states she missed a phone call. Please cal!

## 2014-11-30 NOTE — Telephone Encounter (Signed)
Can we write another Rx?

## 2014-11-30 NOTE — Telephone Encounter (Signed)
Patient is calling because she lost her pain pill prescription. She wants to know if we can give her another prescription. Please call! [phone]

## 2014-12-01 ENCOUNTER — Telehealth: Payer: Self-pay | Admitting: Family Medicine

## 2014-12-01 ENCOUNTER — Ambulatory Visit (INDEPENDENT_AMBULATORY_CARE_PROVIDER_SITE_OTHER): Payer: Medicare Other

## 2014-12-01 ENCOUNTER — Ambulatory Visit (INDEPENDENT_AMBULATORY_CARE_PROVIDER_SITE_OTHER): Payer: Medicare Other | Admitting: Family Medicine

## 2014-12-01 ENCOUNTER — Ambulatory Visit (HOSPITAL_COMMUNITY)
Admission: RE | Admit: 2014-12-01 | Discharge: 2014-12-01 | Disposition: A | Discharge status: Home / Self Care | Payer: Medicare Other | Source: Ambulatory Visit | Attending: Family Medicine | Admitting: Family Medicine

## 2014-12-01 VITALS — BP 108/60 | HR 75 | Temp 98.8°F | Resp 17 | Ht 64.0 in | Wt 93.8 lb

## 2014-12-01 DIAGNOSIS — M79605 Pain in left leg: Secondary | ICD-10-CM | POA: Insufficient documentation

## 2014-12-01 DIAGNOSIS — R600 Localized edema: Secondary | ICD-10-CM

## 2014-12-01 DIAGNOSIS — M25552 Pain in left hip: Secondary | ICD-10-CM

## 2014-12-01 DIAGNOSIS — D509 Iron deficiency anemia, unspecified: Secondary | ICD-10-CM | POA: Diagnosis not present

## 2014-12-01 DIAGNOSIS — E559 Vitamin D deficiency, unspecified: Secondary | ICD-10-CM

## 2014-12-01 DIAGNOSIS — R0689 Other abnormalities of breathing: Secondary | ICD-10-CM | POA: Diagnosis not present

## 2014-12-01 DIAGNOSIS — E43 Unspecified severe protein-calorie malnutrition: Secondary | ICD-10-CM | POA: Diagnosis not present

## 2014-12-01 LAB — COMPREHENSIVE METABOLIC PANEL
ALT: 12 U/L (ref 0–35)
AST: 16 U/L (ref 0–37)
Albumin: 3.5 g/dL (ref 3.5–5.2)
Alkaline Phosphatase: 104 U/L (ref 39–117)
BUN: 12 mg/dL (ref 6–23)
CALCIUM: 8.6 mg/dL (ref 8.4–10.5)
CO2: 25 meq/L (ref 19–32)
Chloride: 105 mEq/L (ref 96–112)
Creat: 0.65 mg/dL (ref 0.50–1.10)
GLUCOSE: 90 mg/dL (ref 70–99)
Potassium: 4.5 mEq/L (ref 3.5–5.3)
SODIUM: 138 meq/L (ref 135–145)
TOTAL PROTEIN: 6 g/dL (ref 6.0–8.3)
Total Bilirubin: 0.3 mg/dL (ref 0.2–1.2)

## 2014-12-01 MED ORDER — ALPRAZOLAM 0.5 MG PO TABS: 0.5000 mg | ORAL_TABLET | Freq: Four times a day (QID) | ORAL | Status: DC | PRN

## 2014-12-01 MED ORDER — DULOXETINE HCL 20 MG PO CPEP: 20.0000 mg | ORAL_CAPSULE | Freq: Every day | ORAL | Status: DC

## 2014-12-01 MED ORDER — HYDROCODONE-ACETAMINOPHEN 10-325 MG PO TABS: 1.0000 | ORAL_TABLET | Freq: Four times a day (QID) | ORAL | Status: DC | PRN

## 2014-12-01 MED ORDER — MIRTAZAPINE 30 MG PO TABS: 30.0000 mg | ORAL_TABLET | Freq: Every day | ORAL | Status: DC

## 2014-12-01 MED ORDER — FUROSEMIDE 20 MG PO TABS: 20.0000 mg | ORAL_TABLET | Freq: Every day | ORAL | Status: DC | PRN

## 2014-12-01 NOTE — Progress Notes (Signed)
Subjective:  This chart was scribed for [name], by Tamsen Roers, at [facility].  This patient was seen in room 3 and the patient's care was started at 10:23 AM.    Patient ID: [name], female    DOB: Aug 15, 1950, 64 y.o.   MRN: [id number] Chief Complaint  Patient presents with  . Leg Injury    Left hip- x 3 days   . Nausea    HPI  HPI Comments: [name] is a 64 y.o. female who presents to the [facility] complaining of left hip pain onset three days ago.  Patient states that she still has swelling in her left leg.  She denies any bruises to her hip and keeps it wrapped up so it doesn't move as freely.  She denies any injury and is unaware of the cause of her injury.  She has been more active recently due to moving into a new home (her mothers) with her roommate so she thinks that may be the cause of her pain.  Patient has restarted drinking the supplement drinks a couple times a day and has been eating a lot.  Patient denies any shortness of breath or difficulty breathing when she's laying down.    Medications discussed at 11:49 AM:  Patient states that she was not able to get her prescription refilled at [facility].  She has been out of her pain medication for 1.5 weeks. Patient is aware that she needs to be careful when starting up on her pain medication after being off of it for a while. Patient is still complaint with her lasix (once a day). She is willing to discontinue the lasix.  Patient is taking Xanax more than prescribed at times.  She is no longer taking Reglan and is still using Remeron.  She is willing to bring all of her medications at her next visit. She had no effect with B12 injections.   ----------- She came in to be seen for her hip 1 time when I was not here but the wait was long enough. 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 She was followed by her surgeon until apprroximately  6 months ago when he informed her there was nothing else he could do and transferred her medical care to here PCP here. Since that time I have been trying to manage her chronic pain but her use of hydrocodone has persistently sky rocketted from 5 BID to now 10 QUID. I attemted to switch her to oxycodone 5 which she did not tolerate due to nausea and vomiting and so I placed her back on hydrocodone 10 however she never brought in her oxycodone for disposal. There has been mult episodes where she has used the medication more than prescribed and her reports of med use do not correlate with the amount she is actually using and when she needs refills.  She has been living with a roommate [name] most of her life and reports [name] gives her the pain medication as needed and so patient has been consistently unable to provide any information as to how often she is using it or in what quantity. But unfortunately, for the past several months a 30 day script has been depleted after 2 weeks.  Therefore patient has been using hydrocodone 10's 8 times a day rather than 4.  I did not have a concern for diversion as patients roommate is also a  patient of mine and is not on chronic pain medication. But she was prescribed it during an episode of shingles several months ago and seemed to be using appropriately without any early refills or excessive requests for additional medication. I referred patient to pain management several times before but attemped to prescribe her pain medication myself in hopes that we could correlate that with her malnutrition and depression issues.  I last saw patient on 5/29 at that point she reported she was out of the 120 hydrocodone 10's she was given 2 weeks prior.  She has not brought in her pain medication log or her medications as I have requested numerous times previously and so i refilled patients hydrocodone with only number 30 and referred her to pain management. She was to return to clinic in 4  days for recheck which she did but wait was over 2 hours so patient left and came back 5 days later with the development of hip pain the day prior but again left due to the wait.  t appears that she has not filled the number 30 at this time.    On 5/20 patent developed a large amount of bilateral lower extremity edema of unknown etiology concern due to sedative lifestyle and decreased protein and malnutrition. 1 + on right 2+ on left. No concerns for DVT on exam. Patient was advice to restart her protein drink supplements, and to weight her self daily while starting lasix. 20 BID. Within four days, her weight had decreased by 6 pounds and has remained steady since for the past week. She has normal TSH, UA, ESR.  Did have a mild anemia relatively unchanged over the past 3 months. But did have low protein ,decreased albubum to 2.7 ,Total protein to 4.6 with Calcium decreased to 7.7 ,Pro BNP was elevated at 545, none prior , pre albumin was normal.     Past Medical History  Diagnosis Date  . Peptic ulcer disease     dr Oletta Lamas  . Acute duodenal ulcer with hemorrhage and perforation, with obstruction   . Barrett's esophagus   . Menopause   . GERD (gastroesophageal reflux disease)   . Asthma   . Chronic abdominal pain     narcotic dependence, dr gyarteng-dak at [facility]  . Narcotic abuse   . Anxiety     sees [name] NP at Dr. Radonna Ricker office  . Fx two ribs-open 08-24-11    left 4th and 5th  . Fx of fibula 07-28-11    left fibula, 2 places   . COPD (chronic obstructive pulmonary disease)   . Allergy   . Anemia   . History of blood transfusion     "related to OR; maybe when I perforated that ulcer"  . Lap Nissen + truncal vagotomy July 2008 05/13/2013  . LEG EDEMA 01/19/2008    Qualifier: Diagnosis of  By: [name] MD, [name]   . ACUTE DUODEN ULCER W/HEMORR PERF&OBSTRUCTION 06/26/2004    Qualifier: Diagnosis of  By: [name] MD, [name]   . FEVER, RECURRENT 08/11/2009    Qualifier:  Diagnosis of  By: [name] MD, [name] MENOPAUSE 01/07/2007    Qualifier: Diagnosis of  By: [name], CMA, Camas    . Gastroparesis 06/03/2007    Qualifier: Diagnosis of  By: [name] MD, [name]   . GASTRIC OUTLET OBSTRUCTION 08/28/2007    In July 2008 she underwent laparoscopic enterolysis, Nissen fundoplication over a #25 bougie, single pledgeted suture  colosure of the hiatus and a 3 suture wrap.  Lap truncal vagotomy and a loop gastrojejunostomy was performed.  This was revised in August of 2009 to a roux en Y gastrojejujostomy.     . 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 Depression   . History of hiatal hernia   . Complication of anesthesia     pt states had too much xanax on board - agitated   . Anginal pain     "many many years ago"  . Exertional shortness of breath   . Pneumonia, organism unspecified 11/07/2010    Assoc with R Parapneumonic effusion 09/2010    - Tapped 10/05/10    - CxR resolved 11/07/2010    . Headache     "monthly" (09/06/2014)  . Arthritis     "hips" (09/06/2014)  . Myocardial infarction     Current Outpatient Prescriptions on File Prior to Visit  Medication Sig Dispense Refill  . ALPRAZolam (XANAX) 0.5 MG tablet Take 1 tablet (0.5 mg total) by mouth 4 (four) times daily as needed. For anxiety 120 tablet 0  . aspirin EC 81 MG tablet Take 81 mg by mouth daily.    . DULoxetine (CYMBALTA) 20 MG capsule Take 1 capsule (20 mg total) by mouth daily. 30 capsule 0  . feeding supplement, RESOURCE BREEZE, (RESOURCE BREEZE) LIQD Take 1 Container by mouth 3 (three) times daily between meals. 2 Container 0  . FLUoxetine (PROZAC) 20 MG capsule Take 20 mg by mouth daily.    Marland Kitchen FLUoxetine (PROZAC) 40 MG capsule Take 40 mg by mouth 2 (two) times daily.    . furosemide (LASIX) 20 MG tablet Take 1 tablet (20 mg total) by mouth 2 (two) times daily. 30 tablet 0  . HYDROcodone-acetaminophen (NORCO) 10-325 MG per tablet Take 1 tablet by mouth every 4 (four) hours  as needed. 30 tablet 0  . metoCLOPramide (REGLAN) 5 MG tablet Take 5 mg by mouth 4 (four) times daily.    . mirtazapine (REMERON) 30 MG tablet Take 1 tablet (30 mg total) by mouth at bedtime. 90 tablet 0  . Multiple Vitamin (MULTIVITAMIN WITH MINERALS) TABS tablet Take 1 tablet by mouth daily.    . Multiple Vitamins-Minerals (OCUVITE PO) Take 1 tablet by mouth daily.    . Nutritional Supplements (BOOST BREEZE) LIQD Take 8 oz by mouth 3 (three) times daily. 90 Can 11  . omeprazole (PRILOSEC) 40 MG capsule Take 1 capsule (40 mg total) by mouth 2 (two) times daily. 180 capsule 3  . ondansetron (ZOFRAN ODT) 4 MG disintegrating tablet Take 1-2 tablets (4-8 mg total) by mouth every 8 (eight) hours as needed for nausea or vomiting. 60 tablet 5  . risperiDONE (RISPERDAL) 2 MG tablet Take 1 tablet (2 mg total) by mouth at bedtime. 30 tablet 2   Current Facility-Administered Medications on File Prior to Visit  Medication Dose Route Frequency Provider Last Rate Last Dose  . cyanocobalamin ((VITAMIN B-12)) injection 1,000 mcg  1,000 mcg Intramuscular Q30 days Shawnee Knapp, MD   1,000 mcg at 10/20/14 1247    Allergies  Allergen Reactions  . Celebrex [Celecoxib] Nausea Only  . Morphine Itching    REACTION: itching  . Quetiapine     REACTION: tardive dyskinesia  . Varenicline Tartrate     REACTION: unspecified     Review of Systems  Constitutional: Negative for fever and chills.  Eyes: Negative for pain, discharge, redness and itching.  Respiratory: Negative for  choking and shortness of breath.   Gastrointestinal: Negative for nausea and vomiting.  Musculoskeletal: Positive for arthralgias. Negative for neck pain and neck stiffness.       Objective:   Physical Exam  Constitutional: She is oriented to person, place, and time. No distress.  HENT:  Head: Normocephalic and atraumatic.  Eyes: Conjunctivae and EOM are normal.  Cardiovascular: Normal rate, regular rhythm and normal heart sounds.     No murmur heard. Pulmonary/Chest: Effort normal and breath sounds normal. No respiratory distress. She has no wheezes. She has no rales.  Musculoskeletal: Normal range of motion.  Some tenderness over the lateral greater trochanter on left.  ilium normal to palpation  No pain or swelling/adenopathy inguinally.  Right with normal range of motion.  Flexion extension Left :ROM mildly decreased with flexion but still has excellent internal and external range of motion.    Neurological: She is alert and oriented to person, place, and time.  Psychiatric: She has a normal mood and affect. Her behavior is normal.  Nursing note and vitals reviewed.   Filed Vitals:   12/01/14 0848  BP: 108/60  Pulse: 75  Temp: 98.8 F (37.1 C)  TempSrc: Oral  Resp: 17  Height: '5\' 4"'  (1.626 m)  Weight: 93 lb 12.8 oz (42.547 kg)  SpO2: 98%     [facility] (PRIMARY) x-ray report read by Dr. Brigitte Pulse  Left hip x ray showed arthritis, prior surgery, no acute abnormalities        Assessment & Plan:    Next appointment in 2 weeks for med refills - bring all bottles to appt - rec scheduling as 104 whenever possible - also needs AWV.  1. Hip pain, acute, left   2. Hypocalcemia 0  Low calcium was due to malnutrition, when level is corrected for her low albumin, calcium is in normal range of 8.7.    3. Vitamin D deficiency   4. Edema of left lower extremity   5. Protein-calorie malnutrition, severe   6.      Chronic pain syndrome - [facility].  2 wk refill of hydrocodone provided.  Orders Placed This Encounter  Procedures  . DG HIP UNILAT WITH PELVIS 2-3 VIEWS LEFT    Standing Status: Future     Number of Occurrences: 1     Standing Expiration Date: 01/31/2016    Order Specific Question:  Reason for Exam (SYMPTOM  OR DIAGNOSIS REQUIRED)    Answer:  pain and lower ext swelling    Order Specific Question:  Preferred imaging location?    Answer:  External  . Comprehensive metabolic  panel  . Vit D  25 hydroxy (rtn osteoporosis monitoring)  . Pro b natriuretic peptide  . PTH, Intact and Calcium  . Protein electrophoresis, serum  . Immunofixation electrophoresis    Meds ordered this encounter  Medications  . mirtazapine (REMERON) 30 MG tablet    Sig: Take 1 tablet (30 mg total) by mouth at bedtime.    Dispense:  90 tablet    Refill:  0  . HYDROcodone-acetaminophen (NORCO) 10-325 MG per tablet    Sig: Take 1 tablet by mouth every 6 (six) hours as needed.    Dispense:  60 tablet    Refill:  0  . furosemide (LASIX) 20 MG tablet    Sig: Take 1 tablet (20 mg total) by mouth daily as needed for edema.    Dispense:  30 tablet    Refill:  0  .  DULoxetine (CYMBALTA) 20 MG capsule    Sig: Take 1 capsule (20 mg total) by mouth daily.    Dispense:  30 capsule    Refill:  0  . ALPRAZolam (XANAX) 0.5 MG tablet    Sig: Take 1 tablet (0.5 mg total) by mouth 4 (four) times daily as needed. For anxiety    Dispense:  120 tablet    Refill:  0   Over 40 min spent in face-to-face evaluation of and consultation with patient and coordination of care.  Over 50% of this time was spent counseling this patient.  I personally performed the services described in this documentation, which was scribed in my presence. The recorded information has been reviewed and considered, and addended by me as needed.  [name], MD MPH

## 2014-12-01 NOTE — Patient Instructions (Signed)
Please report to [facility] Radiology Dept at 5:00 pm today for your Venous Doppler of the left leg. Once the ultrasound has been completed, you will be asked to wait for Dr. Brigitte Pulse to receive the results so you can be advised of the next step.

## 2014-12-01 NOTE — Progress Notes (Signed)
VASCULAR LAB PRELIMINARY  PRELIMINARY  PRELIMINARY  PRELIMINARY  Left lower extremity venous duplex completed.    Preliminary report:  Left:  No evidence of DVT, superficial thrombosis, or Baker's cyst.  [name], RVS 12/01/2014, 5:50 PM

## 2014-12-01 NOTE — Telephone Encounter (Signed)
Spoke with patient she will call us back to make these appt.    Please schedule pt a f/u OV w/ me for medication refill at 104 on July 8th and on July 29th as well as on August 12th and September 9th. If would like to see pt on June 24th as well but see the schedule already looks full - if there is an opening or cancellation on the 24th, please put pt in there - if there is not then please instruct her to come into the walk-in clinic to see me on June 23rd and remind her to bring her medications as well as any medication log, diet log, mood log, etc w/ her.

## 2014-12-01 NOTE — Telephone Encounter (Signed)
Spoke with patient and she is in the clinic seeing Dr. Brigitte Pulse at this time.

## 2014-12-01 NOTE — Telephone Encounter (Signed)
Pt seen today in office. 

## 2014-12-02 ENCOUNTER — Other Ambulatory Visit: Payer: Self-pay | Admitting: Family Medicine

## 2014-12-02 LAB — VITAMIN D 25 HYDROXY (VIT D DEFICIENCY, FRACTURES): Vit D, 25-Hydroxy: 9 ng/mL — ABNORMAL LOW (ref 30–100)

## 2014-12-02 LAB — PTH, INTACT AND CALCIUM
CALCIUM: 8.4 mg/dL (ref 8.4–10.5)
PTH: 83 pg/mL — AB (ref 14–64)

## 2014-12-02 LAB — PRO B NATRIURETIC PEPTIDE: Pro B Natriuretic peptide (BNP): 135.8 pg/mL — ABNORMAL HIGH (ref <126)

## 2014-12-05 DIAGNOSIS — M25552 Pain in left hip: Secondary | ICD-10-CM | POA: Diagnosis not present

## 2014-12-05 LAB — IMMUNOFIXATION ELECTROPHORESIS
IGA: 224 mg/dL (ref 69–380)
IGM, SERUM: 93 mg/dL (ref 52–322)
IgG (Immunoglobin G), Serum: 714 mg/dL (ref 690–1700)
Total Protein, Serum Electrophoresis: 6.1 g/dL (ref 6.0–8.3)

## 2014-12-06 LAB — PROTEIN ELECTROPHORESIS, SERUM
ALBUMIN ELP: 3.5 g/dL — AB (ref 3.8–4.8)
Alpha-1-Globulin: 0.5 g/dL — ABNORMAL HIGH (ref 0.2–0.3)
Alpha-2-Globulin: 0.7 g/dL (ref 0.5–0.9)
Beta 2: 0.3 g/dL (ref 0.2–0.5)
Beta Globulin: 0.4 g/dL (ref 0.4–0.6)
GAMMA GLOBULIN: 0.7 g/dL — AB (ref 0.8–1.7)
Total Protein, Serum Electrophoresis: 6.1 g/dL (ref 6.1–8.1)

## 2014-12-14 ENCOUNTER — Telehealth: Payer: Self-pay

## 2014-12-14 DIAGNOSIS — M25552 Pain in left hip: Secondary | ICD-10-CM | POA: Diagnosis not present

## 2014-12-14 NOTE — Telephone Encounter (Signed)
Pt states she is calling about her medicine,but can't give more information to [facility] to Dr Brigitte Pulse??   Best phone for pt is [phone]

## 2014-12-15 NOTE — Telephone Encounter (Signed)
Left message for pt to call back  °

## 2014-12-16 NOTE — Telephone Encounter (Signed)
Left message for pt to call back  °

## 2014-12-16 NOTE — Telephone Encounter (Signed)
Spoke with pt, she would not speak with me about what she needed. She would like to talk to Dr. Brigitte Pulse.

## 2014-12-19 NOTE — Telephone Encounter (Signed)
Pt missed her call. Please call back when able at [phone]

## 2014-12-19 NOTE — Telephone Encounter (Signed)
Called pt, LVM, will try again later.

## 2014-12-27 ENCOUNTER — Telehealth: Payer: Self-pay

## 2014-12-27 NOTE — Telephone Encounter (Signed)
Pt is returning our call from earlier to day

## 2014-12-27 NOTE — Telephone Encounter (Signed)
lvm for pt again - asking her to tell Jonelle Sidle or other TL what is going on, how her meds are working, what she needs.  If she doesn't want to do this if she could find a time when she is available and let me know to stop phone tag.  Or she can come in to see me at any time.

## 2014-12-28 NOTE — Telephone Encounter (Signed)
[name] Knapp, MD at 12/27/2014 9:38 AM     Status: Signed       Expand All Collapse All   lvm for pt again - asking her to tell Jonelle Sidle or other TL what is going on, how her meds are working, what she needs. If she doesn't want to do this if she could find a time when she is available and let me know to stop phone tag. Or she can come in to see me at any time.

## 2014-12-29 ENCOUNTER — Ambulatory Visit (INDEPENDENT_AMBULATORY_CARE_PROVIDER_SITE_OTHER): Payer: Medicare Other | Admitting: Family Medicine

## 2014-12-29 VITALS — BP 112/64 | HR 98 | Temp 98.5°F | Resp 17 | Ht 64.5 in | Wt 93.0 lb

## 2014-12-29 DIAGNOSIS — F439 Reaction to severe stress, unspecified: Secondary | ICD-10-CM

## 2014-12-29 DIAGNOSIS — Z658 Other specified problems related to psychosocial circumstances: Secondary | ICD-10-CM

## 2014-12-29 DIAGNOSIS — F411 Generalized anxiety disorder: Secondary | ICD-10-CM | POA: Diagnosis not present

## 2014-12-29 DIAGNOSIS — G894 Chronic pain syndrome: Secondary | ICD-10-CM | POA: Diagnosis not present

## 2014-12-29 DIAGNOSIS — M7072 Other bursitis of hip, left hip: Secondary | ICD-10-CM | POA: Diagnosis not present

## 2014-12-29 MED ORDER — ALPRAZOLAM 0.5 MG PO TABS: 0.5000 mg | ORAL_TABLET | Freq: Four times a day (QID) | ORAL | Status: DC | PRN

## 2014-12-29 MED ORDER — HYDROCODONE-ACETAMINOPHEN 10-325 MG PO TABS: 1.0000 | ORAL_TABLET | Freq: Four times a day (QID) | ORAL | Status: DC | PRN

## 2014-12-29 NOTE — Progress Notes (Signed)
[facility] [address], [place] [address] [phone]  Date:  12/29/2014   Name:  [name]   DOB:  12-07-1950   MRN:  [id number]  PCP:  [name], MD    Chief Complaint: Leg Pain; Medication Refill; and Depression   History of Present Illness:  [name] is a 64 y.o. very pleasant female patient who presents with the following:  This is a pt of my partner Dr. Brigitte Pulse- [place] came in to see her today but she is not in the office (schedule change due to illness) so she asks to see me.  Reviewed Dr. Raul Del last note from a month ago- it looks like she had been using more pain medication that she was supposed to be, Harmon Pier gave her a 2 week rx of norco 10 (1 every 6 hours as needed, #60) with plans for her to see pain management.  [facility] shows that this is the last opoid rx filled by this pt.  She has cut down on her opoid use for her chronic pain  She continues to have pain in her left hip. She has been seen by ortho- she is being seen at [facility].  She states that she finished a medrol dose-pack but has not noted much improvement.  She is seeing them again later this month but may move her appt up if she needs to.  States that she was dx with bursitis She states that her depression sx are stable. She is in the process of moving in with her mother- this is a big change for her and has upset her mood some.  However she is not in any danger of self- harm and states that her sx are overall   "I want to see Dr. Brigitte Pulse soon but if you could just give me enough pain medication and xanax to last me for a few days."   Patient Active Problem List   Diagnosis Date Noted  . 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 Anemia, iron deficiency 11/03/2014  . Chronic pain syndrome 10/24/2014  . Osteoporosis 10/05/2014  . Vertebral compression fracture 10/05/2014  . Coronary artery disease due to calcified coronary lesion 10/05/2014  . Weight loss   . Tobacco abuse   .  History of Roux-en-Y gastric bypass   . Orthostatic hypotension 08/11/2014  . S/P jejunostomy Feb 2016 07/26/2014  . Severe protein-calorie malnutrition 12/30/2013  . Constipation, chronic 07/10/2013  . Chronic abdominal pain   . COPD (chronic obstructive pulmonary disease) 11/07/2010  . Major depressive disorder, recurrent episode, moderate 06/06/2010  . 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 Anxiety state 01/07/2007  . BARRETT'S ESOPHAGUS, HX OF 07/03/2005    Past Medical History  Diagnosis Date  . Peptic ulcer disease     dr Oletta Lamas  . Acute duodenal ulcer with hemorrhage and perforation, with obstruction   . Barrett's esophagus   . Menopause   . GERD (gastroesophageal reflux disease)   . Asthma   . Chronic abdominal pain     narcotic dependence, dr gyarteng-dak at [facility]  . Narcotic abuse   . Anxiety     sees [name] NP at Dr. Radonna Ricker office  . Fx two ribs-open 08-24-11    left 4th and 5th  . Fx of fibula 07-28-11    left fibula, 2 places   . COPD (chronic obstructive pulmonary disease)   . Allergy   . Anemia   . History of blood  transfusion     "related to OR; maybe when I perforated that ulcer"  . Lap Nissen + truncal vagotomy July 2008 05/13/2013  . LEG EDEMA 01/19/2008    Qualifier: Diagnosis of  By: [name] MD, [name]   . ACUTE DUODEN ULCER W/HEMORR PERF&OBSTRUCTION 06/26/2004    Qualifier: Diagnosis of  By: [name] MD, [name]   . FEVER, RECURRENT 08/11/2009    Qualifier: Diagnosis of  By: [name] MD, [name] MENOPAUSE 01/07/2007    Qualifier: Diagnosis of  By: [name], CMA, [place]    . Gastroparesis 06/03/2007    Qualifier: Diagnosis of  By: [name] MD, [name]   . GASTRIC OUTLET OBSTRUCTION 08/28/2007    In July 2008 she underwent laparoscopic enterolysis, Nissen fundoplication over a #93 bougie, single pledgeted suture colosure of the hiatus and a 3 suture wrap.  Lap truncal vagotomy and a loop gastrojejunostomy was performed.  This was revised in August of  2009 to a roux en Y gastrojejujostomy.     . 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 Depression   . History of hiatal hernia   . Complication of anesthesia     pt states had too much xanax on board - agitated   . Anginal pain     "many many years ago"  . Exertional shortness of breath   . Pneumonia, organism unspecified 11/07/2010    Assoc with R Parapneumonic effusion 09/2010    - Tapped 10/05/10    - CxR resolved 11/07/2010    . Headache     "monthly" (09/06/2014)  . Arthritis     "hips" (09/06/2014)  . Myocardial infarction     Past Surgical History  Procedure Laterality Date  . Esophagogastroduodenoscopy  12-29-09    dr qadeer at [facility], several gastric ulcers  . Laparoscopic nissen fundoplication    . 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 Repair of perforated ulcer    . Biopsy thyroid  08-13-11    benign nodule, per Dr. Melida Quitter   . Esophagogastroduodenoscopy N/A 09/25/2012    Procedure: ESOPHAGOGASTRODUODENOSCOPY (EGD);  Surgeon: Beryle Beams, MD;  Location: [facility];  Service: Endoscopy;  Laterality: N/A;  . Fracture surgery    . Tonsillectomy    . Cataract extraction w/ intraocular lens  implant, bilateral Bilateral   . Laparoscopic partial gastrectomy N/A 06/29/2013    Procedure: Gastrectomy and GJ Tube placement;  Surgeon: Pedro Earls, MD;  Location: [facility];  Service: General;  Laterality: N/A;  . Colonoscopy    . Gastrostomy N/A 07/26/2014    Procedure: LAPRASCOPIC ASSISTED OPEN PLACEMENT OF JEJUNOSTOMY TUBE;  Surgeon: Pedro Earls, MD;  Location: [facility];  Service: General;  Laterality: N/A;  . Orif hip fracture Bilateral     "pins in both of them"  . Dilation and curettage of uterus    . Hernia repair      "hiatal"  . Joint replacement      History  Substance Use Topics  . Smoking status: Current Every Day Smoker -- 2.00 packs/day for 50 years    Types: Cigarettes  . Smokeless tobacco: Never Used  . Alcohol  Use: No    Family History  Problem Relation Age of Onset  . Cancer Mother     kidney, magliant tumor on face  . Stroke Mother   . Celiac disease Father   . Cancer Father     kidney  and pancreatic    Allergies  Allergen Reactions  . Celebrex [Celecoxib] Nausea Only  . Morphine Itching    REACTION: itching  . Quetiapine     REACTION: tardive dyskinesia  . Varenicline Tartrate     REACTION: unspecified    Medication list has been reviewed and updated.  Current Outpatient Prescriptions on File Prior to Visit  Medication Sig Dispense Refill  . aspirin EC 81 MG tablet Take 81 mg by mouth daily.    . DULoxetine (CYMBALTA) 20 MG capsule Take 1 capsule (20 mg total) by mouth daily. 30 capsule 0  . feeding supplement, RESOURCE BREEZE, (RESOURCE BREEZE) LIQD Take 1 Container by mouth 3 (three) times daily between meals. 2 Container 0  . furosemide (LASIX) 20 MG tablet Take 1 tablet (20 mg total) by mouth daily as needed for edema. 30 tablet 0  . mirtazapine (REMERON) 30 MG tablet Take 1 tablet (30 mg total) by mouth at bedtime. 90 tablet 0  . Multiple Vitamin (MULTIVITAMIN WITH MINERALS) TABS tablet Take 1 tablet by mouth daily.    . Multiple Vitamins-Minerals (OCUVITE PO) Take 1 tablet by mouth daily.    . Nutritional Supplements (BOOST BREEZE) LIQD Take 8 oz by mouth 3 (three) times daily. 90 Can 11  . omeprazole (PRILOSEC) 40 MG capsule Take 1 capsule (40 mg total) by mouth 2 (two) times daily. 180 capsule 3  . ondansetron (ZOFRAN ODT) 4 MG disintegrating tablet Take 1-2 tablets (4-8 mg total) by mouth every 8 (eight) hours as needed for nausea or vomiting. 60 tablet 5  . risperiDONE (RISPERDAL) 2 MG tablet Take 1 tablet (2 mg total) by mouth at bedtime. 30 tablet 2  . ALPRAZolam (XANAX) 0.5 MG tablet Take 1 tablet (0.5 mg total) by mouth 4 (four) times daily as needed. For anxiety (Patient not taking: Reported on 12/29/2014) 120 tablet 0  . HYDROcodone-acetaminophen (NORCO) 10-325 MG  per tablet Take 1 tablet by mouth every 6 (six) hours as needed. (Patient not taking: Reported on 12/29/2014) 60 tablet 0   No current facility-administered medications on file prior to visit.    Review of Systems:  As per HPI- otherwise negative.   Physical Examination: Filed Vitals:   12/29/14 0818  BP: 112/64  Pulse: 98  Temp: 98.5 F (36.9 C)  Resp: 17   Filed Vitals:   12/29/14 0818  Height: 5' 4.5" (1.638 m)  Weight: 93 lb (42.185 kg)   Body mass index is 15.72 kg/(m^2). Ideal Body Weight: Weight in (lb) to have BMI = 25: 147.6  GEN: WDWN, NAD, Non-toxic, A & O x 3, very thin appearance.  She is not wearing her dentures- they were misplaced during her recent move.  HEENT: Atraumatic, Normocephalic. Neck supple. No masses, No LAD. Ears and Nose: No external deformity. CV: RRR, No M/G/R. No JVD. No thrill. No extra heart sounds. PULM: CTA B, no wheezes, crackles, rhonchi. No retractions. No resp. distress. No accessory muscle use. EXTR: No c/c/e NEURO Normal gait.    Left hip with normal gross ROM PSYCH: Normally interactive. Conversant. Not depressed or anxious appearing.  Calm demeanor.   Wt Readings from Last 3 Encounters:  12/29/14 93 lb (42.185 kg)  12/01/14 93 lb 12.8 oz (42.547 kg)  11/29/14 92 lb 9.6 oz (42.003 kg)    Assessment and Plan: Chronic pain syndrome - Plan: HYDROcodone-acetaminophen (NORCO) 10-325 MG per tablet  GAD (generalized anxiety disorder) - Plan: ALPRAZolam (XANAX) 0.5 MG tablet  Hip bursitis, left  Situational stress  Stress from move- she states that she is getting through this ok Continue to see ortho about her hip Refilled her pain medication and xanax for 2 weeks so she can come and see her PCP.  She is happy with this solution Denies any intent of self harm and seems in good sprits today Encouraged her to continue to eat and try to gain weight - she is underweight but stable  Signed Lamar Blinks, MD

## 2014-12-29 NOTE — Patient Instructions (Signed)
Sorry about the mix-up today.  Please see Dr. Brigitte Pulse when you can- take care!

## 2014-12-30 ENCOUNTER — Telehealth: Payer: Self-pay

## 2014-12-30 MED ORDER — ONDANSETRON 4 MG PO TBDP: 4.0000 mg | ORAL_TABLET | Freq: Three times a day (TID) | ORAL | Status: DC | PRN

## 2014-12-30 NOTE — Telephone Encounter (Signed)
Pt is needing a refill on her nausea medication

## 2014-12-30 NOTE — Telephone Encounter (Signed)
Is pt on this medication long term? Please advise on refill.

## 2014-12-30 NOTE — Telephone Encounter (Signed)
Based off of the last provider rx by her PCP Dr. Brigitte Pulse, this appears to be a long term medication. I do not mind providing her a refill but patient should come in for evaluation if she is having fevers, worsening n/v, diarrhea. Thank you!

## 2015-01-05 ENCOUNTER — Ambulatory Visit (INDEPENDENT_AMBULATORY_CARE_PROVIDER_SITE_OTHER): Payer: Medicare Other | Admitting: Family Medicine

## 2015-01-05 ENCOUNTER — Other Ambulatory Visit: Payer: Self-pay | Admitting: Family Medicine

## 2015-01-05 DIAGNOSIS — R109 Unspecified abdominal pain: Secondary | ICD-10-CM

## 2015-01-05 DIAGNOSIS — G8929 Other chronic pain: Secondary | ICD-10-CM

## 2015-01-05 DIAGNOSIS — F411 Generalized anxiety disorder: Secondary | ICD-10-CM | POA: Diagnosis not present

## 2015-01-05 DIAGNOSIS — G4700 Insomnia, unspecified: Secondary | ICD-10-CM | POA: Diagnosis not present

## 2015-01-05 DIAGNOSIS — R634 Abnormal weight loss: Secondary | ICD-10-CM

## 2015-01-05 DIAGNOSIS — G894 Chronic pain syndrome: Secondary | ICD-10-CM | POA: Diagnosis not present

## 2015-01-05 DIAGNOSIS — Z720 Tobacco use: Secondary | ICD-10-CM

## 2015-01-05 DIAGNOSIS — M810 Age-related osteoporosis without current pathological fracture: Secondary | ICD-10-CM

## 2015-01-05 DIAGNOSIS — M4850XS Collapsed vertebra, not elsewhere classified, site unspecified, sequela of fracture: Secondary | ICD-10-CM | POA: Diagnosis not present

## 2015-01-05 DIAGNOSIS — E43 Unspecified severe protein-calorie malnutrition: Secondary | ICD-10-CM | POA: Diagnosis not present

## 2015-01-05 DIAGNOSIS — E559 Vitamin D deficiency, unspecified: Secondary | ICD-10-CM

## 2015-01-05 DIAGNOSIS — F331 Major depressive disorder, recurrent, moderate: Secondary | ICD-10-CM

## 2015-01-05 DIAGNOSIS — IMO0001 Reserved for inherently not codable concepts without codable children: Secondary | ICD-10-CM

## 2015-01-05 DIAGNOSIS — N2581 Secondary hyperparathyroidism of renal origin: Secondary | ICD-10-CM | POA: Diagnosis not present

## 2015-01-05 MED ORDER — OLANZAPINE 5 MG PO TABS: 5.0000 mg | ORAL_TABLET | Freq: Every day | ORAL | Status: DC

## 2015-01-05 MED ORDER — HYDROCODONE-ACETAMINOPHEN 10-325 MG PO TABS: 1.0000 | ORAL_TABLET | Freq: Four times a day (QID) | ORAL | Status: DC | PRN

## 2015-01-05 MED ORDER — HYDROCODONE-ACETAMINOPHEN 10-325 MG PO TABS: 1.0000 | ORAL_TABLET | Freq: Four times a day (QID) | ORAL | Status: DC

## 2015-01-05 MED ORDER — ALPRAZOLAM 0.5 MG PO TABS: 0.5000 mg | ORAL_TABLET | Freq: Four times a day (QID) | ORAL | Status: DC | PRN

## 2015-01-05 MED ORDER — BOOST / RESOURCE BREEZE PO LIQD: 1.0000 | Freq: Three times a day (TID) | ORAL | Status: DC

## 2015-01-05 NOTE — Patient Instructions (Signed)
Please come back in and see me the week of August 8th so we can do med refills and see how you are doing now that we stopped a lot of your medicines and started you on the zyprexa at night. I will be out of office 8/13-8/27

## 2015-01-05 NOTE — Progress Notes (Addendum)
Subjective:  This chart was scribed for [name], MD by Leandra Kern, Medical Scribe. This patient was seen in Room 1 and the patient's care was started at 9:11 AM.   Patient ID: [name], female    DOB: March 05, 1951, 64 y.o.   MRN: [id number]  Chief Complaint  Patient presents with  . Leg Pain    x 2 months  . Abdominal Pain    x years , takes med hydrocodone    HPI HPI Comments: [name] is a 64 y.o. female who presents to [facility] complaining of Abdominal pain.   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 Until approximately 6 months ago, pt has been followed by her surgeon Dr. Hassell Done who lastly states that he had no longer had anything to offer for her. Pt began to see me for her primary care physician, and she was dependent on chronic narcotics for pain control and I recommended her to see pain management. However as she was having to be seen in our office very frequently due to her complicated medical history and malnutrition I agreed to provide her pain management. However for several months pt had continually used her medications more than prescribed, and did not follow through the recommendations of recording diet and medication use as well as pain and mood. She was continually unable to account for her increase used of narcotic medications and continually providing conflicting info on how much med she is using and when she needed refills. Therefore, pt was again recommended to go for pain management, but she again no show appointment at [facility].  I have last saw pt 5 weeks ago, and she had onset of leg hip pain and swelling on left leg without known injury. She had been started on Lasix daily to help with edema which might be due to her severe protein malnutrition.  Pt was been wind off her Paxil which she had been on for 20 years and had been started on Remeron and Xanax. She failed Reglan. She  did not improve with B12 injections. She was slowly gaining weight by supplementing protein drinks in addition to meals. Her last visit we started her on Cymbalta 20 mg. Pt was advised to make follow up office visit with me with times offered, but did not follow up on this.  She was in last week and was seen by Dr. Lorelei Pont. Pt saw orthopedics for left Hip pain, and completed the treatment with no improvements. She and her roommate are moving into her mother house. Her mother does not live there, she is in a nursing home. Pt seems to be using an extraordinary amount of Zofran. Pt was given 60 taps with 5 refills on 5/6 and needed a new prescription with 60 taps with refills 2 months later. Her weight has been steady at 93 lbs for the past 2 months. I last gave pt hydrocodone 10 # 60 intending her to last for 2 weeks, and she has not requested a medication for a month. She received the same refill just one week prior. Her use of alprazolam has also been sporadic and increasing. She was mainly at 0.5 mg 4 times a day, so given two week supply last week.   Today, pt presents here noting that her weight has been steady, and she is aware that the process needs time. Pt states that her sleeping has been very well, the only  problem she notes is her difficulty getting to sleep. She notes that she always needs to take a medication to be able to fall asleep, and she reports that that might be due to her lack of physical activity during the day.  Pt is not taking her fluoxetine medication anymore. Pt seems to have duplicate prescription of some medications. Pt indicates that she was prescribed Risperdal about a year ago by Dr. Hassell Done, however she has not been very compliant with the medications. She notes that she does not notice any different when off the medication. She expresses interest in changing the medication. Pt denies symptoms of heartburn or indigestion as side effects to her medications.   Pt report that she  has been seeing Dr. Raliegh Ip for her hip pain couple of weeks ago. She notes that the cortisone shot did not help. Pt reports that the area has a constant aching pain at night. During the pain she notes that doing activities cause the pain to give a stabbing sensation. Pt states that physical therapy might be a good option for her.    Pt notes that she has been doing her meals and medications entries in her calendar almost every day. Pt reports that she finally moved to her mother's house, the process was difficult due to her living in her previous house for 35 years, however she seems to be coping with it well.    Active Ambulatory Problems    Diagnosis Date Noted  . Anxiety state 01/07/2007  . 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 BARRETT'S ESOPHAGUS, HX OF 07/03/2005  . Major depressive disorder, recurrent episode, moderate 06/06/2010  . COPD (chronic obstructive pulmonary disease) 11/07/2010  . Constipation, chronic 07/10/2013  . Chronic abdominal pain   . Severe protein-calorie malnutrition 12/30/2013  . S/P jejunostomy Feb 2016 07/26/2014  . Orthostatic hypotension 08/11/2014  . Weight loss   . Tobacco abuse   . History of Roux-en-Y gastric bypass   . Osteoporosis 10/05/2014  . Vertebral compression fracture 10/05/2014  . Coronary artery disease due to calcified coronary lesion 10/05/2014  . Chronic pain syndrome 10/24/2014  . Anemia, iron deficiency 11/03/2014  . 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   Resolved Ambulatory Problems    Diagnosis Date Noted  . FEVER, RECURRENT 08/11/2009  . Depressive type psychosis 01/10/2010  . ASTHMA 01/07/2007  . LUNG NODULE 03/15/2010  . GERD 01/07/2007  . ACUTE DUODEN ULCER W/HEMORR PERF&OBSTRUCTION 06/26/2004  . Gastroparesis 06/03/2007  . GASTRIC OUTLET OBSTRUCTION 08/28/2007  . MENOPAUSE 01/07/2007  . LEG EDEMA 01/19/2008  . Pneumonia, organism unspecified 11/07/2010  . Lap Nissen + truncal vagotomy July 2008 05/13/2013   . Chronic duodenal ulcer with gastric outlet obstruction 06/29/2013   Past Medical History  Diagnosis Date  . Peptic ulcer disease   . Acute duodenal ulcer with hemorrhage and perforation, with obstruction   . Barrett's esophagus   . Menopause   . GERD (gastroesophageal reflux disease)   . Asthma   . Narcotic abuse   . Anxiety   . Fx two ribs-open 08-24-11  . Fx of fibula 07-28-11  . Allergy   . Anemia   . History of blood transfusion   . Depression   . History of hiatal hernia   . Complication of anesthesia   . Anginal pain   . Exertional shortness of breath   . Headache   . Arthritis   . Myocardial infarction    Current Outpatient Prescriptions on File Prior to Visit  Medication Sig Dispense Refill  . ALPRAZolam (XANAX) 0.5 MG tablet Take 1 tablet (0.5 mg total) by mouth 4 (four) times daily as needed. For anxiety 60 tablet 0  . aspirin EC 81 MG tablet Take 81 mg by mouth daily.    . DULoxetine (CYMBALTA) 20 MG capsule Take 1 capsule (20 mg total) by mouth daily. 30 capsule 0  . FLUoxetine (PROZAC) 40 MG capsule Take 40 mg by mouth daily.    . furosemide (LASIX) 20 MG tablet Take 1 tablet (20 mg total) by mouth daily as needed for edema. 30 tablet 0  . HYDROcodone-acetaminophen (NORCO) 10-325 MG per tablet Take 1 tablet by mouth every 6 (six) hours as needed. 60 tablet 0  . metoCLOPramide (REGLAN) 10 MG tablet Take 10 mg by mouth 4 (four) times daily.    . mirtazapine (REMERON) 30 MG tablet Take 1 tablet (30 mg total) by mouth at bedtime. 90 tablet 0  . Multiple Vitamin (MULTIVITAMIN WITH MINERALS) TABS tablet Take 1 tablet by mouth daily.    . Multiple Vitamins-Minerals (OCUVITE PO) Take 1 tablet by mouth daily.    . Nutritional Supplements (BOOST BREEZE) LIQD Take 8 oz by mouth 3 (three) times daily. 90 Can 11  . omeprazole (PRILOSEC) 40 MG capsule Take 1 capsule (40 mg total) by mouth 2 (two) times daily. 180 capsule 3  . ondansetron (ZOFRAN ODT) 4 MG disintegrating tablet  Take 1-2 tablets (4-8 mg total) by mouth every 8 (eight) hours as needed for nausea or vomiting. 60 tablet 5  . risperiDONE (RISPERDAL) 2 MG tablet Take 1 tablet (2 mg total) by mouth at bedtime. 30 tablet 2  . feeding supplement, RESOURCE BREEZE, (RESOURCE BREEZE) LIQD Take 1 Container by mouth 3 (three) times daily between meals. 2 Container 0   No current facility-administered medications on file prior to visit.   Allergies  Allergen Reactions  . Celebrex [Celecoxib] Nausea Only  . Morphine Itching    REACTION: itching  . Quetiapine     REACTION: tardive dyskinesia  . Varenicline Tartrate     REACTION: unspecified     Review of Systems  Constitutional: Positive for activity change, appetite change and fatigue. Negative for fever, chills and unexpected weight change.  Respiratory: Negative for shortness of breath.   Cardiovascular: Negative for chest pain and leg swelling.  Gastrointestinal: Positive for nausea, abdominal pain and diarrhea. Negative for vomiting, constipation, blood in stool and abdominal distention.  Genitourinary: Negative for dysuria, decreased urine volume, difficulty urinating, menstrual problem and pelvic pain.  Musculoskeletal: Positive for myalgias, back pain, joint swelling and arthralgias. Negative for gait problem.  Skin: Negative for color change and rash.  Neurological: Positive for weakness.  Hematological: Negative for adenopathy.  Psychiatric/Behavioral: Positive for confusion, dysphoric mood and decreased concentration. Negative for hallucinations and sleep disturbance. The patient is nervous/anxious. The patient is not hyperactive.       Objective:   Physical Exam  Constitutional: She is oriented to person, place, and time. She appears well-developed and well-nourished. No distress.  HENT:  Head: Normocephalic and atraumatic.  Eyes: EOM are normal. Pupils are equal, round, and reactive to light.  Neck: Neck supple.  Cardiovascular: Normal  rate.   Pulmonary/Chest: Effort normal.  Neurological: She is alert and oriented to person, place, and time. No cranial nerve deficit.  Skin: Skin is warm and dry.  Psychiatric: She has a normal mood and affect. Her behavior is normal.  Nursing note and vitals reviewed.  There  were no vitals taken for this visit.     Assessment & Plan:   1. Chronic pain syndrome - on pain contract w/ me.  Referred to [facility] twice (pt was seen prior) as pt was continually using more than rx'ed but adament that she will be able to comply w/ directed sig - was out early due to some falling into toilet and roommate (who also sees me) has used some due to a arthritic flair.  2. Chronic abdominal pain   3. Weight loss   4. Tobacco abuse   5. Severe protein-calorie malnutrition   6. Major depressive disorder, recurrent episode, moderate   7. Anxiety state   8. Osteoporosis   9. Vertebral compression fracture, sequela   10. Insomnia  - stop risperdal since does not seem to be helping -  had tardive dyskinesia on seroquel so discussed w/ [facility] pharmacist and will try pt on zyprexa as they report this is one of the atypical antipsychotics that causes the most weight gain.   11. GAD (generalized anxiety disorder)   12.    Secondary hyperparathyroidism - new - presumed secondary to malnutrition - review at next OV 13.    Vitamin D def - much worse, start high dose once weekly x  6 mos.  Needs f/u OV for any additional refills of pain med - pt brought have of her meds with her today and was able to dispose of 8 bottles of medication she is no longer on, had duplicates of, or dose was wrong. She left w/ 4 bottles only (mirtazapine, cymbalta, omeprazole, lasix).  Instructed to bring her second bag of meds with her to next visit so we can again ensure she is taking only the correct things.  Reviewed food/pain/mood/med diary.     Meds ordered this encounter  Medications  . feeding supplement (BOOST / RESOURCE BREEZE)  LIQD    Sig: Take 1 Container by mouth 3 (three) times daily between meals.    Dispense:  90 Container    Refill:  11  . HYDROcodone-acetaminophen (NORCO) 10-325 MG per tablet    Sig: Take 1 tablet by mouth every 6 (six) hours as needed.    Dispense:  60 tablet    Refill:  0  . ALPRAZolam (XANAX) 0.5 MG tablet    Sig: Take 1 tablet (0.5 mg total) by mouth 4 (four) times daily as needed. For anxiety    Dispense:  60 tablet    Refill:  1    May fill refill after 2 weeks.  Marland Kitchen OLANZapine (ZYPREXA) 5 MG tablet    Sig: Take 1 tablet (5 mg total) by mouth at bedtime.    Dispense:  30 tablet    Refill:  0    Failed risperdal, cannot tolerate seroquel due to tardive dyskinesia  . HYDROcodone-acetaminophen (NORCO) 10-325 MG per tablet    Sig: Take 1 tablet by mouth every 6 (six) hours.    Dispense:  60 tablet    Refill:  0    May fill on or after 2 weeks from date of fill written    I personally performed the services described in this documentation, which was scribed in my presence. The recorded information has been reviewed and considered, and addended by me as needed.  [name], MD MPH

## 2015-01-06 NOTE — Telephone Encounter (Signed)
Dr Brigitte Pulse, I see at last OV you said you had DCd lots of pt's meds and I don't see this on her current med list at end of [facility]. Did you DC this med, or do you want to RF?

## 2015-01-08 NOTE — Telephone Encounter (Signed)
risperdal was d/c'd and started zyprexa due to more sedation and weight gain with latter.

## 2015-01-09 ENCOUNTER — Other Ambulatory Visit: Payer: Self-pay

## 2015-01-09 MED ORDER — ONDANSETRON 4 MG PO TBDP: 4.0000 mg | ORAL_TABLET | Freq: Three times a day (TID) | ORAL | Status: DC | PRN

## 2015-01-10 DIAGNOSIS — N2581 Secondary hyperparathyroidism of renal origin: Secondary | ICD-10-CM | POA: Insufficient documentation

## 2015-01-10 DIAGNOSIS — E559 Vitamin D deficiency, unspecified: Secondary | ICD-10-CM | POA: Insufficient documentation

## 2015-01-10 MED ORDER — ERGOCALCIFEROL 1.25 MG (50000 UT) PO CAPS: 50000.0000 [IU] | ORAL_CAPSULE | ORAL | Status: DC

## 2015-01-10 NOTE — Addendum Note (Signed)
Addended by: [name] on: 01/10/2015 02:51 AM   Modules accepted: Orders

## 2015-01-11 ENCOUNTER — Encounter: Payer: Self-pay | Admitting: Family Medicine

## 2015-01-24 DIAGNOSIS — H3531 Nonexudative age-related macular degeneration: Secondary | ICD-10-CM | POA: Diagnosis not present

## 2015-01-24 DIAGNOSIS — H538 Other visual disturbances: Secondary | ICD-10-CM | POA: Diagnosis not present

## 2015-01-25 ENCOUNTER — Ambulatory Visit (INDEPENDENT_AMBULATORY_CARE_PROVIDER_SITE_OTHER): Payer: Medicare Other | Admitting: Family Medicine

## 2015-01-25 VITALS — BP 102/62 | HR 67 | Temp 98.7°F | Resp 18 | Ht 64.5 in | Wt 97.8 lb

## 2015-01-25 DIAGNOSIS — R5381 Other malaise: Secondary | ICD-10-CM | POA: Diagnosis not present

## 2015-01-25 DIAGNOSIS — F411 Generalized anxiety disorder: Secondary | ICD-10-CM | POA: Diagnosis not present

## 2015-01-25 DIAGNOSIS — Z5181 Encounter for therapeutic drug level monitoring: Secondary | ICD-10-CM

## 2015-01-25 DIAGNOSIS — G894 Chronic pain syndrome: Secondary | ICD-10-CM

## 2015-01-25 LAB — COMPREHENSIVE METABOLIC PANEL WITH GFR
ALT: 10 U/L (ref 6–29)
AST: 13 U/L (ref 10–35)
Albumin: 3 g/dL — ABNORMAL LOW (ref 3.6–5.1)
Alkaline Phosphatase: 91 U/L (ref 33–130)
BUN: 16 mg/dL (ref 7–25)
CO2: 25 mmol/L (ref 20–31)
Calcium: 8.5 mg/dL — ABNORMAL LOW (ref 8.6–10.4)
Chloride: 107 mmol/L (ref 98–110)
Creat: 0.65 mg/dL (ref 0.50–0.99)
Glucose, Bld: 77 mg/dL (ref 65–99)
Potassium: 4.8 mmol/L (ref 3.5–5.3)
Sodium: 142 mmol/L (ref 135–146)
Total Bilirubin: 0.3 mg/dL (ref 0.2–1.2)
Total Protein: 5.5 g/dL — ABNORMAL LOW (ref 6.1–8.1)

## 2015-01-25 LAB — POCT CBC
Granulocyte percent: 61.5 % (ref 37–80)
HCT, POC: 39.5 % (ref 37.7–47.9)
Hemoglobin: 12.5 g/dL (ref 12.2–16.2)
Lymph, poc: 1.7 (ref 0.6–3.4)
MCH, POC: 28.5 pg (ref 27–31.2)
MCHC: 31.7 g/dL — AB (ref 31.8–35.4)
MCV: 90 fL (ref 80–97)
MID (cbc): 0.5 (ref 0–0.9)
MPV: 8.1 fL (ref 0–99.8)
POC Granulocyte: 3.5 (ref 2–6.9)
POC LYMPH PERCENT: 30.6 % (ref 10–50)
POC MID %: 7.9 % (ref 0–12)
Platelet Count, POC: 344 K/uL (ref 142–424)
RBC: 4.39 M/uL (ref 4.04–5.48)
RDW, POC: 15.4 %
WBC: 5.7 K/uL (ref 4.6–10.2)

## 2015-01-25 LAB — POCT URINALYSIS DIPSTICK
Bilirubin, UA: NEGATIVE
Blood, UA: NEGATIVE
Glucose, UA: NEGATIVE
Ketones, UA: NEGATIVE
Leukocytes, UA: NEGATIVE
Nitrite, UA: NEGATIVE
Protein, UA: NEGATIVE
Spec Grav, UA: 1.015
Urobilinogen, UA: 0.2
pH, UA: 5

## 2015-01-25 LAB — TSH: TSH: 2.213 u[IU]/mL (ref 0.350–4.500)

## 2015-01-25 MED ORDER — LITHIUM CARBONATE 150 MG PO CAPS: 150.0000 mg | ORAL_CAPSULE | Freq: Three times a day (TID) | ORAL | Status: DC

## 2015-01-25 MED ORDER — HYDROCODONE-ACETAMINOPHEN 10-325 MG PO TABS: 1.0000 | ORAL_TABLET | Freq: Four times a day (QID) | ORAL | Status: DC | PRN

## 2015-01-25 MED ORDER — ALPRAZOLAM 1 MG PO TABS: ORAL_TABLET | ORAL | Status: DC

## 2015-01-25 NOTE — Patient Instructions (Signed)
Start litium twice a day. If you are doing ok in a week you can increase it to three times a day with food.  We increased your xanax - doubled the dose - do not use more than prescribed.  Continue your mirtazapine and your olanzapine before bed.  You can use your hydrocodone 4 times a day.  Come back in 2 weeks for quick recheck with Dr. Lorelei Pont.  She will refill your hydrocodone at that time and make sure you are still doing ok on the lithium and see if the dose needs to be adjusted - will be quick in-and-out visit.  I want to see you back the last week in Aug/first week in Sept. When your medications regulate and you are on a stable regimen being used only as prescribed then we can lengthen the time between visits hopefully to every 3 months as our goal.  Lithium Toxicity Lithium is a medicine used to treat depression and bipolar disorders. Lithium poisoning (toxicity) occurs when the amount of lithium in the blood is too high. The greater the amount in the blood, the more problems occur. This is common among those taking lithium. This is because the amount of lithium needed to help the disease is not much different than the amount that can cause poisoning. Hence, it is very important to detect signs of toxicity early.  CAUSES  Lithium toxicity can occur for several reasons:  Intentional overdose.  Accidental overdose.  If taking many medicines, confusion can lead to taking too much or not enough of one or more prescriptions.  Even if taking lithium correctly, other medicines can change levels in the blood. Many drugs (such as medicines used for arthritis, some antibiotics, water pills (diuretics), dilantin (used for control of epilepsy), and cyclosporine (used for patients receiving transplants) can increase lithium levels.  If unrelated diseases develop, the amount of lithium in the blood might go up or down.  While working with your caregiver to fine tune your dosing, it is possible that a  prescribed dose might be too much. SYMPTOMS  The symptoms of mild-to-moderate lithium toxicity are:   Diarrhea.  Vomiting.  Drowsiness.  Thirst.  Muscle weakness.  Mild shakiness, particularly of the hands.  Frequent urination. The symptoms of severe toxicity are:  Blurred vision.  Giddiness.  Ringing in the ears (Tinnitus).  Seizures.  Increased shakiness.  Severe muscle spasms.  Loss of consciousness or coma. Some people will get symptoms of mild toxicity even if the level gets close to the top of the normal range. Even if your lab test shows a blood level that is "normal", it is important to tell your caregiver if you are having any of the symptoms noted above. DIAGNOSIS  Your caregiver will look at your signs and symptoms. You will be asked about the dose of lithium you are taking and when the last dose was taken. You will also be asked about how long you have been on the medicine. Then, your caregiver will order blood tests to check your blood lithium level.  TREATMENT  Treatment is mainly supportive.   Mild symptoms can be controlled by reducing or stopping the drug. This can be restarted 24-48 hours later at a lower dose. Your caregiver may recommend changing the daily dose of lithium.  Severe cases are treated by removing lithium from the body. Care is started in a hospital emergency department.  A tube may be placed through the nose or mouth into the stomach. The tube  can be used to remove lithium that has not been digested yet. It can also be used to put medicines directly into the stomach to help stop lithium absorption.  Medicines may be used that cause diarrhea. This can help stop absorption of lithium. Your caregiver may suggest cleaning of blood using an artificial kidney (dialysis). This is usually only used for the most severe cases. HOME CARE INSTRUCTIONS   Take the dose of lithium as advised by your caregiver.  Never take more than your advised  dose.  Drink plenty of water as dehydration increases the risk of lithium toxicity.  Seek the advice of your caregiver before starting a low-salt diet as reducing your salt intake can lead to toxicity.  Check your lithium levels in the blood regularly. Tests are generally done 12-18 hours after the last dose. SEEK MEDICAL CARE IF:   You notice any symptoms of lithium toxicity as described above.  You are confused or have questions about how to take your medicines. SEEK IMMEDIATE MEDICAL CARE IF:   Your symptoms are severe.  You discover that you have accidentally taken too much lithium or too much of any other medicine. MAKE SURE YOU:   Understand these instructions.  Will watch your condition.  Will get help right away if you are not doing well or get worse. Document Released: 04/07/2007 Document Revised: 09/02/2011 Document Reviewed: 04/07/2007 [facility] Patient Information 2015 [employer], [place]. This information is not intended to replace advice given to you by your health care provider. Make sure you discuss any questions you have with your health care provider.

## 2015-01-25 NOTE — Progress Notes (Addendum)
Subjective:  This chart was scribed for [name], by Tamsen Roers, at [facility].  This patient was seen in room 11 and the patient's care was started at 8:52 AM.    Patient ID: [name], female    DOB: December 13, 1950, 64 y.o.   MRN: [id number] Chief Complaint  Patient presents with  . Medication Refill    zofran, xanax, hydrocodone  . Follow-up    abd pain  . Depression    would like to discuss medication     HPI  HPI Comments: [name] is a 64 y.o. female who presents to the [facility] for medication refill.  Patient states that she is going through a lot right now with her recent move and her mom has been acting out so she is requesting a low dose of a lithium pill as she feels like she is having a lot of mood changes.  She has taken lithium in the past in her 79's and states that it evened her mood out significantly.  She states that when she has these "highs", she starts cleaning and starts doing chores around the house at a very fast pace. She states that this has always been the case with her behavior. She has no other complaints today.   Medications:  Patient does not have a pill dispenser at home and states that she has never had an issue keeping track of her medication.  Her roommate states that she has an extra one at home that patient can use. Patient did not bring her medications to her visit today but states that the Zyprexa is working.  She states that she has also been taking her roommates Xanax which has been helping her get to sleep.  Patient is aware that she has been taking more Xanax than prescribed ( should be 4X a day).  She is also taking hydrocodone 4 times a day.   ------- Received a 2 weeks supply of her hydrocodone from Dr. Edilia Bo on 12-29-14.  I refilled this 1 week later and she had script filled 2 days before being due but it has been 15 days since she filled her last 2 weeks script. Same with alprazolam.   At patients last visit she brought half of her medication bottles and we went through all her medications eliminating and discarding meany duplicates and stocked medication due to amount of sedative medications patient is on.  I was concerned that she was not taking them correctly or as prescribed. Patient has gained 4 pounds wince her last visit 2 weeks ago.  She is at the highest weight she has been for over a year.  Her plast visit she was continuing complaining of insomnia so I took her off of Risperdal and started her on Zyprexa.   [name] had an EKG in March of this year which was normal.     Past Medical History  Diagnosis Date  . Peptic ulcer disease     dr Oletta Lamas  . Acute duodenal ulcer with hemorrhage and perforation, with obstruction   . Barrett's esophagus   . Menopause   . GERD (gastroesophageal reflux disease)   . Asthma   . Chronic abdominal pain     narcotic dependence, dr gyarteng-dak at [facility]  . Narcotic abuse   . Anxiety     sees [name] NP at Dr. Radonna Ricker office  . Fx two ribs-open 08-24-11  left 4th and 5th  . Fx of fibula 07-28-11    left fibula, 2 places   . COPD (chronic obstructive pulmonary disease)   . Allergy   . Anemia   . History of blood transfusion     "related to OR; maybe when I perforated that ulcer"  . Lap Nissen + truncal vagotomy July 2008 05/13/2013  . LEG EDEMA 01/19/2008    Qualifier: Diagnosis of  By: [name] MD, [name]   . ACUTE DUODEN ULCER W/HEMORR PERF&OBSTRUCTION 06/26/2004    Qualifier: Diagnosis of  By: [name] MD, [name]   . FEVER, RECURRENT 08/11/2009    Qualifier: Diagnosis of  By: [name] MD, [name] MENOPAUSE 01/07/2007    Qualifier: Diagnosis of  By: [name], CMA, [place]    . Gastroparesis 06/03/2007    Qualifier: Diagnosis of  By: [name] MD, [name]   . GASTRIC OUTLET OBSTRUCTION 08/28/2007    In July 2008 she underwent laparoscopic enterolysis, Nissen fundoplication over a #23 bougie, single pledgeted suture  colosure of the hiatus and a 3 suture wrap.  Lap truncal vagotomy and a loop gastrojejunostomy was performed.  This was revised in August of 2009 to a roux en Y gastrojejujostomy.     . 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 Depression   . History of hiatal hernia   . Complication of anesthesia     pt states had too much xanax on board - agitated   . Anginal pain     "many many years ago"  . Exertional shortness of breath   . Pneumonia, organism unspecified 11/07/2010    Assoc with R Parapneumonic effusion 09/2010    - Tapped 10/05/10    - CxR resolved 11/07/2010    . Headache     "monthly" (09/06/2014)  . Arthritis     "hips" (09/06/2014)  . Myocardial infarction     Current Outpatient Prescriptions on File Prior to Visit  Medication Sig Dispense Refill  . ALPRAZolam (XANAX) 0.5 MG tablet Take 1 tablet (0.5 mg total) by mouth 4 (four) times daily as needed. For anxiety 60 tablet 1  . aspirin EC 81 MG tablet Take 81 mg by mouth daily.    . feeding supplement (BOOST / RESOURCE BREEZE) LIQD Take 1 Container by mouth 3 (three) times daily between meals. 90 Container 11  . HYDROcodone-acetaminophen (NORCO) 10-325 MG per tablet Take 1 tablet by mouth every 6 (six) hours as needed. 60 tablet 0  . mirtazapine (REMERON) 30 MG tablet Take 1 tablet (30 mg total) by mouth at bedtime. 90 tablet 0  . Multiple Vitamin (MULTIVITAMIN WITH MINERALS) TABS tablet Take 1 tablet by mouth daily.    . Multiple Vitamins-Minerals (OCUVITE PO) Take 1 tablet by mouth daily.    Marland Kitchen OLANZapine (ZYPREXA) 5 MG tablet Take 1 tablet (5 mg total) by mouth at bedtime. 30 tablet 0  . omeprazole (PRILOSEC) 40 MG capsule Take 1 capsule (40 mg total) by mouth 2 (two) times daily. 180 capsule 3  . ondansetron (ZOFRAN ODT) 4 MG disintegrating tablet Take 1-2 tablets (4-8 mg total) by mouth every 8 (eight) hours as needed for nausea or vomiting. 60 tablet 5  . ergocalciferol (VITAMIN D2) 50000 UNITS capsule  Take 1 capsule (50,000 Units total) by mouth once a week. (Patient not taking: Reported on 01/25/2015) 24 capsule 0  . furosemide (LASIX) 20 MG tablet Take 1 tablet (20 mg total) by mouth daily as  needed for edema. (Patient not taking: Reported on 01/25/2015) 30 tablet 0  . HYDROcodone-acetaminophen (NORCO) 10-325 MG per tablet Take 1 tablet by mouth every 6 (six) hours. 60 tablet 0   No current facility-administered medications on file prior to visit.    Allergies  Allergen Reactions  . Celebrex [Celecoxib] Nausea Only  . Morphine Itching    REACTION: itching  . Quetiapine     REACTION: tardive dyskinesia  . Varenicline Tartrate     REACTION: unspecified        Review of Systems  Constitutional: Positive for activity change, appetite change and fatigue. Negative for fever, chills and unexpected weight change.  Eyes: Negative for pain, discharge and itching.  Respiratory: Negative for choking.   Gastrointestinal: Positive for nausea and abdominal pain. Negative for vomiting, diarrhea, blood in stool, abdominal distention and anal bleeding.  Musculoskeletal: Negative for gait problem.  Skin: Negative for wound.  Neurological: Positive for weakness.  Hematological: Bruises/bleeds easily.  Psychiatric/Behavioral: Positive for confusion, sleep disturbance, dysphoric mood, decreased concentration and agitation. Negative for suicidal ideas and hallucinations. The patient is nervous/anxious. The patient is not hyperactive.        Objective:   Physical Exam  Constitutional: She is oriented to person, place, and time. Vital signs are normal. She appears cachectic. No distress.  HENT:  Head: Normocephalic and atraumatic.  Eyes: Conjunctivae and EOM are normal.  Cardiovascular: Normal rate.   Pulmonary/Chest: Effort normal. No respiratory distress.  Neurological: She is alert and oriented to person, place, and time.  Skin: Skin is warm and dry.  Psychiatric: Her speech is normal and  behavior is normal. Her mood appears anxious. Cognition and memory are impaired. She exhibits a depressed mood. She expresses no suicidal plans and no homicidal plans. She exhibits abnormal recent memory.  Nursing note and vitals reviewed.   Filed Vitals:   01/25/15 0813  BP: 102/62  Pulse: 67  Temp: 98.7 F (37.1 C)  TempSrc: Oral  Resp: 18  Height: 5' 4.5" (1.638 m)  Weight: 97 lb 12.8 oz (44.362 kg)  SpO2: 95%      Assessment & Plan:   1. Medication monitoring encounter - again requested pt to always bring all med bottles - I suspect her overuse is at least in part due to confusion/sedation from numerous high dose sedative meds-  on remeron 30, zyprexa 5, xanax 1 tid, lithium, hydrocodone 10 qid   2. GAD (generalized anxiety disorder) - increased from 0.5 qid to 1 mg tid as sxs and insomnia and mood sxs worsening.  Was weaned off prozac sev mos ago which she had been on for decades and changed from risperdol to zyprexa as pt may have better effects for weight gain and sedation.     3. Chronic pain syndrome - on qid hydrocodone 10 chronically - on pain contract with me. 2 wk supply at a time as pt had prior been using 30d suppy in 2 wks but usuage has dramatically dereased over the past several months as pt really does not want to go a pain clinic which she understands will happen if she continues to not follow sig - is going to try to use [facility].  Will ask [facility] to refill a 2 wk supply at f/u OV.   4.      Mood d/o - pt does have some sxs of bipolar type 1 so will try on lithium as pt requested due to the worsening of her sxs even though  she had sig side effects on lithim prior.  Pt needs to RTC in 2 wks for repeat labs - cmp, ua, tsh - have asked her to f/u w/ [facility] - then I will see pt back after the following 2 wks.    Orders Placed This Encounter  Procedures  . Comprehensive metabolic panel  . TSH  . PTH, Intact and Calcium  . POCT urinalysis dipstick  . POCT CBC     Meds ordered this encounter  Medications  . ALPRAZolam (XANAX) 1 MG tablet    Sig: Take 1 tab with breakfast, 1 tab early afternoon, and 1/2 tab with supper and 1/2 tab before bed    Dispense:  45 tablet    Refill:  1    May fill refill after 2 weeks.  Marland Kitchen HYDROcodone-acetaminophen (NORCO) 10-325 MG per tablet    Sig: Take 1 tablet by mouth every 6 (six) hours as needed.    Dispense:  60 tablet    Refill:  0  . lithium carbonate 150 MG capsule    Sig: Take 1 capsule (150 mg total) by mouth 3 (three) times daily with meals.    Dispense:  90 capsule    Refill:  0    I personally performed the services described in this documentation, which was scribed in my presence. The recorded information has been reviewed and considered, and addended by me as needed.  [name], MD MPH

## 2015-01-26 LAB — PTH, INTACT AND CALCIUM
CALCIUM: 8.5 mg/dL (ref 8.4–10.5)
PTH: 56 pg/mL (ref 14–64)

## 2015-02-05 ENCOUNTER — Other Ambulatory Visit: Payer: Self-pay | Admitting: Family Medicine

## 2015-02-05 ENCOUNTER — Ambulatory Visit (INDEPENDENT_AMBULATORY_CARE_PROVIDER_SITE_OTHER): Payer: Medicare Other | Admitting: Emergency Medicine

## 2015-02-05 VITALS — BP 100/60 | HR 81 | Temp 98.4°F | Resp 16 | Ht 64.5 in | Wt 97.0 lb

## 2015-02-05 DIAGNOSIS — F411 Generalized anxiety disorder: Secondary | ICD-10-CM

## 2015-02-05 DIAGNOSIS — F331 Major depressive disorder, recurrent, moderate: Secondary | ICD-10-CM

## 2015-02-05 DIAGNOSIS — J449 Chronic obstructive pulmonary disease, unspecified: Secondary | ICD-10-CM | POA: Diagnosis not present

## 2015-02-05 DIAGNOSIS — Z720 Tobacco use: Secondary | ICD-10-CM

## 2015-02-05 NOTE — Patient Instructions (Signed)

## 2015-02-05 NOTE — Progress Notes (Signed)
Subjective:  Patient ID: [name], female    DOB: May 28, 1951  Age: 64 y.o. MRN: [id number]  CC: Generalized Body Aches; Chills; Nausea; and other   HPI [name] presents  with numerous medical problems she was started on lithium 11 days ago. She apparently had trouble taking that in her 50s but requested Dr. Brigitte Pulse to resume the medicine as she felt that things were overwhelming her. Since taking the medicine she's been nauseated and not a been able to eat. She says she has general body aches and chills and some nausea. She denies any fever. She has no cough nasal congestion postnasal drainage or sore throat. No stool change. No blood in her stool or black stool. She has no increasing cough. She still has a usual exertional shortness of breath. That she attributes to her COPD. When asked what wrong with her "I have a number of terminal conditions". She is scheduled to see Dr. Edilia Bo next week  History Marcea has a past medical history of Peptic ulcer disease; Acute duodenal ulcer with hemorrhage and perforation, with obstruction; Barrett's esophagus; Menopause; GERD (gastroesophageal reflux disease); Asthma; Chronic abdominal pain; Narcotic abuse; Anxiety; Fx two ribs-open (08-24-11); Fx of fibula (07-28-11); COPD (chronic obstructive pulmonary disease); Allergy; Anemia; History of blood transfusion; Lap Nissen + truncal vagotomy July 2008 (05/13/2013); LEG EDEMA (01/19/2008); ACUTE DUODEN ULCER W/HEMORR PERF&OBSTRUCTION (06/26/2004); FEVER, RECURRENT (08/11/2009); MENOPAUSE (01/07/2007); Gastroparesis (06/03/2007); GASTRIC OUTLET OBSTRUCTION (08/28/2007); Chronic duodenal ulcer with gastric outlet obstruction (06/29/2013); Depression; History of hiatal hernia; Complication of anesthesia; Anginal pain; Exertional shortness of breath; Pneumonia, organism unspecified (11/07/2010); Headache; Arthritis; and Myocardial infarction.   She has past surgical history that includes Esophagogastroduodenoscopy  (12-29-09); Laparoscopic Nissen fundoplication; Roux-en-Y Gastric Bypass (02-20-08); Repair of perforated ulcer; Biopsy thyroid (08-13-11); Esophagogastroduodenoscopy (N/A, 09/25/2012); Fracture surgery; Tonsillectomy; Cataract extraction w/ intraocular lens  implant, bilateral (Bilateral); Laparoscopic partial gastrectomy (N/A, 06/29/2013); Colonoscopy; Gastrostomy (N/A, 07/26/2014); ORIF hip fracture (Bilateral); Dilation and curettage of uterus; Hernia repair; and Joint replacement.   Her  family history includes Cancer in her father and mother; Celiac disease in her father; Stroke in her mother.  She   reports that she has been smoking Cigarettes.  She has a 100 pack-year smoking history. She has never used smokeless tobacco. She reports that she does not drink alcohol or use illicit drugs.  Outpatient Prescriptions Prior to Visit  Medication Sig Dispense Refill  . aspirin EC 81 MG tablet Take 81 mg by mouth daily.    . feeding supplement (BOOST / RESOURCE BREEZE) LIQD Take 1 Container by mouth 3 (three) times daily between meals. 90 Container 11  . HYDROcodone-acetaminophen (NORCO) 10-325 MG per tablet Take 1 tablet by mouth every 6 (six) hours. 60 tablet 0  . HYDROcodone-acetaminophen (NORCO) 10-325 MG per tablet Take 1 tablet by mouth every 6 (six) hours as needed. 60 tablet 0  . mirtazapine (REMERON) 30 MG tablet Take 1 tablet (30 mg total) by mouth at bedtime. 90 tablet 0  . Multiple Vitamin (MULTIVITAMIN WITH MINERALS) TABS tablet Take 1 tablet by mouth daily.    . Multiple Vitamins-Minerals (OCUVITE PO) Take 1 tablet by mouth daily.    Marland Kitchen OLANZapine (ZYPREXA) 5 MG tablet Take 1 tablet (5 mg total) by mouth at bedtime. 30 tablet 0  . omeprazole (PRILOSEC) 40 MG capsule Take 1 capsule (40 mg total) by mouth 2 (two) times daily. 180 capsule 3  . ondansetron (ZOFRAN ODT) 4 MG disintegrating tablet Take 1-2 tablets (  4-8 mg total) by mouth every 8 (eight) hours as needed for nausea or vomiting. 60  tablet 5  . lithium carbonate 150 MG capsule Take 1 capsule (150 mg total) by mouth 3 (three) times daily with meals. 90 capsule 0  . ALPRAZolam (XANAX) 1 MG tablet Take 1 tab with breakfast, 1 tab early afternoon, and 1/2 tab with supper and 1/2 tab before bed (Patient not taking: Reported on 02/05/2015) 45 tablet 1  . ergocalciferol (VITAMIN D2) 50000 UNITS capsule Take 1 capsule (50,000 Units total) by mouth once a week. (Patient not taking: Reported on 01/25/2015) 24 capsule 0  . furosemide (LASIX) 20 MG tablet Take 1 tablet (20 mg total) by mouth daily as needed for edema. (Patient not taking: Reported on 01/25/2015) 30 tablet 0   No facility-administered medications prior to visit.    Social History   Social History  . Marital Status: Divorced    Spouse Name: N/A  . Number of Children: N/A  . Years of Education: N/A   Social History Main Topics  . Smoking status: Current Every Day Smoker -- 2.00 packs/day for 50 years    Types: Cigarettes  . Smokeless tobacco: Never Used  . Alcohol Use: No  . Drug Use: No  . Sexual Activity: No   Other Topics Concern  . None   Social History Narrative     Review of Systems  Constitutional: Negative for fever, chills and appetite change.  HENT: Negative for congestion, ear pain, postnasal drip, sinus pressure and sore throat.   Eyes: Negative for pain and redness.  Respiratory: Negative for cough, shortness of breath and wheezing.   Cardiovascular: Negative for leg swelling.  Gastrointestinal: Negative for nausea, vomiting, abdominal pain, diarrhea, constipation and blood in stool.  Endocrine: Negative for polyuria.  Genitourinary: Negative for dysuria, urgency, frequency and flank pain.  Musculoskeletal: Negative for gait problem.  Skin: Negative for rash.  Neurological: Negative for weakness and headaches.  Psychiatric/Behavioral: Negative for confusion and decreased concentration. The patient is not nervous/anxious.     Objective:    BP 100/60 mmHg  Pulse 81  Temp(Src) 98.4 F (36.9 C) (Oral)  Resp 16  Ht 5' 4.5" (1.638 m)  Wt 97 lb (43.999 kg)  BMI 16.40 kg/m2  SpO2 93%  Physical Exam  Constitutional: She is oriented to person, place, and time. She appears well-developed and well-nourished.  HENT:  Head: Normocephalic and atraumatic.  Eyes: Conjunctivae are normal. Pupils are equal, round, and reactive to light.  Pulmonary/Chest: Effort normal.  Musculoskeletal: She exhibits no edema.  Neurological: She is alert and oriented to person, place, and time.  Skin: Skin is dry.  Psychiatric: She has a normal mood and affect. Her behavior is normal. Thought content normal.      Assessment & Plan:   Ettel was seen today for generalized body aches, chills, nausea and other.  Diagnoses and all orders for this visit:  GAD (generalized anxiety disorder)  Chronic obstructive pulmonary disease, unspecified COPD, unspecified chronic bronchitis type  Anxiety state  Major depressive disorder, recurrent episode, moderate  Tobacco abuse   I have discontinued Ms. Gladish's lithium carbonate. I am also having her maintain her multivitamin with minerals, aspirin EC, Multiple Vitamins-Minerals (OCUVITE PO), omeprazole, mirtazapine, furosemide, feeding supplement, OLANZapine, HYDROcodone-acetaminophen, ondansetron, ergocalciferol, ALPRAZolam, and HYDROcodone-acetaminophen.  No orders of the defined types were placed in this encounter.   I suggested she stop the lithium as it seemed be the medication that side provoking her acute  symptoms she'll do that and follow-up as needed  Appropriate red flag conditions were discussed with the patient as well as actions that should be taken.  Patient expressed his understanding.  Follow-up: Return if symptoms worsen or fail to improve.  Roselee Culver, MD

## 2015-02-08 ENCOUNTER — Ambulatory Visit (INDEPENDENT_AMBULATORY_CARE_PROVIDER_SITE_OTHER): Payer: Medicare Other | Admitting: Family Medicine

## 2015-02-08 VITALS — BP 108/62 | HR 72 | Temp 98.4°F | Resp 18 | Ht 64.57 in | Wt 98.0 lb

## 2015-02-08 DIAGNOSIS — G894 Chronic pain syndrome: Secondary | ICD-10-CM | POA: Diagnosis not present

## 2015-02-08 MED ORDER — HYDROCODONE-ACETAMINOPHEN 10-325 MG PO TABS: 1.0000 | ORAL_TABLET | Freq: Four times a day (QID) | ORAL | Status: DC | PRN

## 2015-02-08 NOTE — Patient Instructions (Signed)
Please see Dr. Brigitte Pulse in the next couple of weeks.  Continue hydrocodone for how as per your usual routine.  Try to eat as I do think more nutrition will help with your mood and anxiety

## 2015-02-08 NOTE — Progress Notes (Signed)
[facility] [address] [place] [address] [phone]  Date:  02/08/2015   Name:  [name]   DOB:  1950/07/25   MRN:  [id number]  PCP:  [name], MD    Chief Complaint: Medication Refill   History of Present Illness:  [name] is a 64 y.o. very pleasant female patient who presents with the following:  This is a chronic pt of my partner Dr. Brigitte Pulse- here to see me today for periodic refills as Dr. Brigitte Pulse is away.  Dr. Brigitte Pulse had mentioned to me that this pt would be coming in for a medication refill  Last here 2 weeks ago and given a 2 week supply of hydrocodone.    She had started her on lithium on 8/3, but this "made me just go crazy.  She was in on 8/14 at which time she and Dr. Ouida Sills decided to stop lithium. She is still feeling poorly, but overall feels "a whole lot better" since she stopped lithium.    She cannot describe her sx except "I just feel bad."  At this time we are not sure what to do about her depression in the long run, but she plans to see Dr. Brigitte Pulse soon to work on a plan  She declines to see a psychiatrist- "they can't help me."  Denies that she is restricting calories- "I'm eating as much as a I can."   Wt Readings from Last 3 Encounters:  02/08/15 98 lb (44.453 kg)  02/05/15 97 lb (43.999 kg)  01/25/15 97 lb 12.8 oz (44.362 kg)     Patient Active Problem List   Diagnosis Date Noted  . Secondary hyperparathyroidism 01/10/2015  . Vitamin D deficiency 01/10/2015  . 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 Anemia, iron deficiency 11/03/2014  . Chronic pain syndrome 10/24/2014  . Osteoporosis 10/05/2014  . Vertebral compression fracture 10/05/2014  . Coronary artery disease due to calcified coronary lesion 10/05/2014  . Weight loss   . Tobacco abuse   . History of Roux-en-Y gastric bypass   . Orthostatic hypotension 08/11/2014  . S/P jejunostomy Feb 2016 07/26/2014  . Severe protein-calorie malnutrition  12/30/2013  . Constipation, chronic 07/10/2013  . Chronic abdominal pain   . COPD (chronic obstructive pulmonary disease) 11/07/2010  . Major depressive disorder, recurrent episode, moderate 06/06/2010  . 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 Anxiety state 01/07/2007  . BARRETT'S ESOPHAGUS, HX OF 07/03/2005    Past Medical History  Diagnosis Date  . Peptic ulcer disease     dr Oletta Lamas  . Acute duodenal ulcer with hemorrhage and perforation, with obstruction   . Barrett's esophagus   . Menopause   . GERD (gastroesophageal reflux disease)   . Asthma   . Chronic abdominal pain     narcotic dependence, dr gyarteng-dak at [facility]  . Narcotic abuse   . Anxiety     sees [name] NP at Dr. Radonna Ricker office  . Fx two ribs-open 08-24-11    left 4th and 5th  . Fx of fibula 07-28-11    left fibula, 2 places   . COPD (chronic obstructive pulmonary disease)   . Allergy   . Anemia   . History of blood transfusion     "related to OR; maybe when I perforated that ulcer"  . Lap Nissen + truncal vagotomy July 2008 05/13/2013  . LEG EDEMA 01/19/2008    Qualifier: Diagnosis of  By: [name] MD, [name]   .  ACUTE DUODEN ULCER W/HEMORR PERF&OBSTRUCTION 06/26/2004    Qualifier: Diagnosis of  By: [name] MD, [name]   . FEVER, RECURRENT 08/11/2009    Qualifier: Diagnosis of  By: [name] MD, [name] MENOPAUSE 01/07/2007    Qualifier: Diagnosis of  By: [name], CMA, [place]    . Gastroparesis 06/03/2007    Qualifier: Diagnosis of  By: [name] MD, [name]   . GASTRIC OUTLET OBSTRUCTION 08/28/2007    In July 2008 she underwent laparoscopic enterolysis, Nissen fundoplication over a #42 bougie, single pledgeted suture colosure of the hiatus and a 3 suture wrap.  Lap truncal vagotomy and a loop gastrojejunostomy was performed.  This was revised in August of 2009 to a roux en Y gastrojejujostomy.     . 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 Depression   . History of hiatal hernia    . Complication of anesthesia     pt states had too much xanax on board - agitated   . Anginal pain     "many many years ago"  . Exertional shortness of breath   . Pneumonia, organism unspecified 11/07/2010    Assoc with R Parapneumonic effusion 09/2010    - Tapped 10/05/10    - CxR resolved 11/07/2010    . Headache     "monthly" (09/06/2014)  . Arthritis     "hips" (09/06/2014)  . Myocardial infarction     Past Surgical History  Procedure Laterality Date  . Esophagogastroduodenoscopy  12-29-09    dr qadeer at [facility], several gastric ulcers  . Laparoscopic nissen fundoplication    . 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 Repair of perforated ulcer    . Biopsy thyroid  08-13-11    benign nodule, per Dr. Melida Quitter   . Esophagogastroduodenoscopy N/A 09/25/2012    Procedure: ESOPHAGOGASTRODUODENOSCOPY (EGD);  Surgeon: Beryle Beams, MD;  Location: [facility];  Service: Endoscopy;  Laterality: N/A;  . Fracture surgery    . Tonsillectomy    . Cataract extraction w/ intraocular lens  implant, bilateral Bilateral   . Laparoscopic partial gastrectomy N/A 06/29/2013    Procedure: Gastrectomy and GJ Tube placement;  Surgeon: Pedro Earls, MD;  Location: [facility];  Service: General;  Laterality: N/A;  . Colonoscopy    . Gastrostomy N/A 07/26/2014    Procedure: LAPRASCOPIC ASSISTED OPEN PLACEMENT OF JEJUNOSTOMY TUBE;  Surgeon: Pedro Earls, MD;  Location: [facility];  Service: General;  Laterality: N/A;  . Orif hip fracture Bilateral     "pins in both of them"  . Dilation and curettage of uterus    . Hernia repair      "hiatal"  . Joint replacement      Social History  Substance Use Topics  . Smoking status: Current Every Day Smoker -- 2.00 packs/day for 50 years    Types: Cigarettes  . Smokeless tobacco: Never Used  . Alcohol Use: No    Family History  Problem Relation Age of Onset  . Cancer Mother     kidney, magliant tumor on face  . Stroke Mother   . Celiac disease  Father   . Cancer Father     kidney and pancreatic    Allergies  Allergen Reactions  . Lithium Nausea And Vomiting  . Celebrex [Celecoxib] Nausea Only  . Morphine Itching    REACTION: itching  . Quetiapine     REACTION: tardive dyskinesia  . Varenicline  Tartrate     REACTION: unspecified    Medication list has been reviewed and updated.  Current Outpatient Prescriptions on File Prior to Visit  Medication Sig Dispense Refill  . ALPRAZolam (XANAX) 1 MG tablet Take 1 tab with breakfast, 1 tab early afternoon, and 1/2 tab with supper and 1/2 tab before bed 45 tablet 1  . aspirin EC 81 MG tablet Take 81 mg by mouth daily.    . ergocalciferol (VITAMIN D2) 50000 UNITS capsule Take 1 capsule (50,000 Units total) by mouth once a week. 24 capsule 0  . feeding supplement (BOOST / RESOURCE BREEZE) LIQD Take 1 Container by mouth 3 (three) times daily between meals. 90 Container 11  . furosemide (LASIX) 20 MG tablet Take 1 tablet (20 mg total) by mouth daily as needed for edema. 30 tablet 0  . HYDROcodone-acetaminophen (NORCO) 10-325 MG per tablet Take 1 tablet by mouth every 6 (six) hours. 60 tablet 0  . HYDROcodone-acetaminophen (NORCO) 10-325 MG per tablet Take 1 tablet by mouth every 6 (six) hours as needed. 60 tablet 0  . mirtazapine (REMERON) 15 MG tablet TAKE 1 TABLET (15 MG TOTAL) BY MOUTH AT BEDTIME. 30 tablet 5  . mirtazapine (REMERON) 30 MG tablet Take 1 tablet (30 mg total) by mouth at bedtime. 90 tablet 0  . Multiple Vitamin (MULTIVITAMIN WITH MINERALS) TABS tablet Take 1 tablet by mouth daily.    . Multiple Vitamins-Minerals (OCUVITE PO) Take 1 tablet by mouth daily.    Marland Kitchen OLANZapine (ZYPREXA) 5 MG tablet Take 1 tablet (5 mg total) by mouth at bedtime. 30 tablet 0  . omeprazole (PRILOSEC) 40 MG capsule Take 1 capsule (40 mg total) by mouth 2 (two) times daily. 180 capsule 3  . ondansetron (ZOFRAN ODT) 4 MG disintegrating tablet Take 1-2 tablets (4-8 mg total) by mouth every 8 (eight)  hours as needed for nausea or vomiting. 60 tablet 5   No current facility-administered medications on file prior to visit.    Review of Systems:  As per HPI- otherwise negative.   Physical Examination: Filed Vitals:   02/08/15 0817  BP: 108/62  Pulse: 72  Temp: 98.4 F (36.9 C)  Resp: 18   Filed Vitals:   02/08/15 0817  Height: 5' 4.57" (1.64 m)  Weight: 98 lb (44.453 kg)   Body mass index is 16.53 kg/(m^2). Ideal Body Weight: Weight in (lb) to have BMI = 25: 147.9  GEN: WDWN, NAD, Non-toxic, A & O x 3, underweight.   HEENT: Atraumatic, Normocephalic. Neck supple. No masses, No LAD. Ears and Nose: No external deformity. CV: RRR, No M/G/R. No JVD. No thrill. No extra heart sounds. PULM: CTA B, no wheezes, crackles, rhonchi. No retractions. No resp. distress. No accessory muscle use. EXTR: No c/c/e NEURO Normal gait.  PSYCH: Normally interactive. Conversant, somewhat anxious as is normal for her.    Assessment and Plan: Chronic pain syndrome - Plan: HYDROcodone-acetaminophen (NORCO) 10-325 MG per tablet  Refilled her hydrocodone for her- 2 weeks supply.  She will see Dr. Brigitte Pulse soon Offered to have her see psychiatry but she is not open to this idea. I also suggested that chronic calorie deprivation may be contributing to her mental health concerns but she denies that she is calorie restricting.    Signed Lamar Blinks, MD

## 2015-02-17 ENCOUNTER — Telehealth: Payer: Self-pay

## 2015-02-17 NOTE — Telephone Encounter (Signed)
Pt is requesting a refill of LASIX because she is still experiencing swelling. Can we please send this to [facility].

## 2015-02-17 NOTE — Telephone Encounter (Signed)
Patient would also like a refill for hydrocodone and xanax

## 2015-02-19 ENCOUNTER — Telehealth: Payer: Self-pay | Admitting: Family Medicine

## 2015-02-19 MED ORDER — FUROSEMIDE 20 MG PO TABS: 20.0000 mg | ORAL_TABLET | Freq: Every day | ORAL | Status: DC | PRN

## 2015-02-19 NOTE — Telephone Encounter (Signed)
lmom for patient to give [place] a call back

## 2015-02-19 NOTE — Telephone Encounter (Signed)
spoke with patient she understands the message she also will come to the [facility] Thursday i will have Staci call and set this up with patient

## 2015-02-19 NOTE — Telephone Encounter (Signed)
Will send lasix in.  Pt needs to come in to the office to see me for refills of her hydrocodone and xanax.  She will not be due for refills until 8/31 - I am having her come in every 2 wks for refills on controlled meds as she always is using more than rx'ed and numerous times I have been very clear that if she can't comply with the sig on the bottle then she is going to need to let me refer her to a pain clinic.  Also, I see that she did not do well on the lithium but that her depression is worsening  - she was seen by Dr. Ouida Sills and Dr. Lorelei Pont in my absence and taken off but nothing else has been restarted yet so we need to discuss her mood and next treatment steps at her OV as well.  Please remind pt that I would like her to bring any journals she has (food, pain, positive thoughts, etc) and she has to bring ALL OF [facility] - every single one - even the old ones - so we can review as with the freq changing of psych meds and switching pharmacies this leaves tons of room for unintentional od, med interactions, med side effects, etc.  Pt can come into the office to see me at 2 pm on Wed 8/31 but I would prefer to get her on my clinic sched for Thursday 9/1 at [facility] (I don't think pt has been there before but I'm sure would appreciate less wait - ok to overbook her if the clinic is full).

## 2015-02-22 ENCOUNTER — Other Ambulatory Visit: Payer: Self-pay | Admitting: Family Medicine

## 2015-02-23 ENCOUNTER — Encounter: Payer: Self-pay | Admitting: Family Medicine

## 2015-02-23 ENCOUNTER — Ambulatory Visit (INDEPENDENT_AMBULATORY_CARE_PROVIDER_SITE_OTHER): Payer: Medicare Other

## 2015-02-23 ENCOUNTER — Ambulatory Visit (INDEPENDENT_AMBULATORY_CARE_PROVIDER_SITE_OTHER): Payer: Medicare Other | Admitting: Family Medicine

## 2015-02-23 VITALS — BP 114/73 | HR 73 | Temp 99.6°F | Resp 16 | Ht 64.5 in | Wt 97.5 lb

## 2015-02-23 DIAGNOSIS — J449 Chronic obstructive pulmonary disease, unspecified: Secondary | ICD-10-CM | POA: Diagnosis not present

## 2015-02-23 DIAGNOSIS — S4992XA Unspecified injury of left shoulder and upper arm, initial encounter: Secondary | ICD-10-CM

## 2015-02-23 DIAGNOSIS — F331 Major depressive disorder, recurrent, moderate: Secondary | ICD-10-CM

## 2015-02-23 DIAGNOSIS — G894 Chronic pain syndrome: Secondary | ICD-10-CM | POA: Diagnosis not present

## 2015-02-23 DIAGNOSIS — R109 Unspecified abdominal pain: Secondary | ICD-10-CM

## 2015-02-23 DIAGNOSIS — M810 Age-related osteoporosis without current pathological fracture: Secondary | ICD-10-CM | POA: Diagnosis not present

## 2015-02-23 DIAGNOSIS — G8929 Other chronic pain: Secondary | ICD-10-CM

## 2015-02-23 DIAGNOSIS — I2584 Coronary atherosclerosis due to calcified coronary lesion: Secondary | ICD-10-CM

## 2015-02-23 DIAGNOSIS — Z5181 Encounter for therapeutic drug level monitoring: Secondary | ICD-10-CM | POA: Diagnosis not present

## 2015-02-23 DIAGNOSIS — I2510 Atherosclerotic heart disease of native coronary artery without angina pectoris: Secondary | ICD-10-CM | POA: Diagnosis not present

## 2015-02-23 DIAGNOSIS — Z23 Encounter for immunization: Secondary | ICD-10-CM

## 2015-02-23 DIAGNOSIS — N2581 Secondary hyperparathyroidism of renal origin: Secondary | ICD-10-CM | POA: Diagnosis not present

## 2015-02-23 DIAGNOSIS — Z720 Tobacco use: Secondary | ICD-10-CM | POA: Diagnosis not present

## 2015-02-23 DIAGNOSIS — E43 Unspecified severe protein-calorie malnutrition: Secondary | ICD-10-CM | POA: Diagnosis not present

## 2015-02-23 DIAGNOSIS — F411 Generalized anxiety disorder: Secondary | ICD-10-CM | POA: Diagnosis not present

## 2015-02-23 DIAGNOSIS — Z9884 Bariatric surgery status: Secondary | ICD-10-CM | POA: Diagnosis not present

## 2015-02-23 DIAGNOSIS — R634 Abnormal weight loss: Secondary | ICD-10-CM

## 2015-02-23 MED ORDER — OLANZAPINE 5 MG PO TABS: 5.0000 mg | ORAL_TABLET | Freq: Every day | ORAL | Status: DC

## 2015-02-23 MED ORDER — HYDROCODONE-ACETAMINOPHEN 10-325 MG PO TABS: 1.0000 | ORAL_TABLET | Freq: Four times a day (QID) | ORAL | Status: DC | PRN

## 2015-02-23 MED ORDER — BOOST / RESOURCE BREEZE PO LIQD: 1.0000 | Freq: Three times a day (TID) | ORAL | Status: DC

## 2015-02-23 MED ORDER — HYDROCODONE-ACETAMINOPHEN 10-325 MG PO TABS: 1.0000 | ORAL_TABLET | Freq: Four times a day (QID) | ORAL | Status: DC

## 2015-02-23 MED ORDER — ALPRAZOLAM 1 MG PO TABS: 1.0000 mg | ORAL_TABLET | Freq: Four times a day (QID) | ORAL | Status: DC | PRN

## 2015-02-23 MED ORDER — MIRTAZAPINE 45 MG PO TABS: 45.0000 mg | ORAL_TABLET | Freq: Every day | ORAL | Status: DC

## 2015-02-23 NOTE — Patient Instructions (Signed)
KEEP ALL OF YOUR OWN MEDICINES.  YOU NEED TO BE IN CONTROL OF YOUR OWN MEDICINES AND RESPONSIBLE FOR HOW THEY ARE USED.  IF YOUR MOOD IS GETTING WORSE, PLEASE CALL SO WE CAN CHANGE YOUR ANTIDEPRESSANTS.  Consider starting a lavender extract supplement - has been shown to help with depression as much as paxil!  We increased your xanax from a total of 3mg  a day for a total of 4 mg a day - this is the absolute max dose.  I refilled your pain medicine.  I am going to keep you on the 2 week fills until you are using them as prescribed, never running out to early, and using a med box  BUY A MEDICATION BOX WITH 4 [place] - once you are taking your medication as instructed every day we will be able to give you a month supply at a time and then we will be able to space out your visits.  Increase your mirtazapine from 30mg  to 45 mg by taking 1 1/2 tabs every night.

## 2015-02-23 NOTE — Progress Notes (Signed)
Subjective:    Patient ID: [name], female    DOB: 12-03-1950, 64 y.o.   MRN: [id number]  Chief Complaint  Patient presents with  . Medication Refill    XANAX, HYDROCODONE, ZOFRAN    HPI  Pt has chronic abdominal pain on chronic narcotics hydrocodone 10 my qid but has frequently been using more than rx but does not want to go to pain management despite several violations (pt using more than rx'ed) so instead I have been giving her a 2 wks supply at a time.  At the last visit, she admited that her depression and mood sxs were very uncontrolled and so she requeted to restart on lithium even though when she was on it prior she felt like a zombie - absolutely horible and swore she would never try it again.  However, she couldn't think of anything else that would help.  However, very shortly after starting the side effects did become unbearable so she stopped it after just sev days - it made her feel very physically sick - feverish, clammy, fatigue, myalgias.  We took pt off prozac sev mos ago and mood sxs wosened. Try to plut pt on remeron but her medication compliance is very questionable - she was on 30mg  but then refilled at 15 mg at her OV w/ Dr. Ouida Sills last mo - pt did bring in an old bottle of prozac in her medicine bag which I had disposed of today.  Nml cbc 1 mo prev but total protein/albutrimin were low but relatively unchanged over the past 6 mos, calcium borderine low (secondary hyperparathroidisn prior).  Not taking vit D   Past Medical History  Diagnosis Date  . Peptic ulcer disease     dr Oletta Lamas  . Acute duodenal ulcer with hemorrhage and perforation, with obstruction   . Barrett's esophagus   . Menopause   . GERD (gastroesophageal reflux disease)   . Asthma   . Chronic abdominal pain     narcotic dependence, dr gyarteng-dak at [facility]  . Narcotic abuse   . Anxiety     sees [name] NP at Dr. Radonna Ricker office  . Fx two ribs-open 08-24-11    left  4th and 5th  . Fx of fibula 07-28-11    left fibula, 2 places   . COPD (chronic obstructive pulmonary disease)   . Allergy   . Anemia   . History of blood transfusion     "related to OR; maybe when I perforated that ulcer"  . Lap Nissen + truncal vagotomy July 2008 05/13/2013  . LEG EDEMA 01/19/2008    Qualifier: Diagnosis of  By: [name] MD, [name]   . ACUTE DUODEN ULCER W/HEMORR PERF&OBSTRUCTION 06/26/2004    Qualifier: Diagnosis of  By: [name] MD, [name]   . FEVER, RECURRENT 08/11/2009    Qualifier: Diagnosis of  By: [name] MD, [name] MENOPAUSE 01/07/2007    Qualifier: Diagnosis of  By: [name], CMA, [place]    . Gastroparesis 06/03/2007    Qualifier: Diagnosis of  By: [name] MD, [name]   . GASTRIC OUTLET OBSTRUCTION 08/28/2007    In July 2008 she underwent laparoscopic enterolysis, Nissen fundoplication over a #73 bougie, single pledgeted suture colosure of the hiatus and a 3 suture wrap.  Lap truncal vagotomy and a loop gastrojejunostomy was performed.  This was revised in August of 2009 to a roux en Y gastrojejujostomy.     . Chronic duodenal ulcer  with gastric outlet obstruction 06/29/2013  . Depression   . History of hiatal hernia   . Complication of anesthesia     pt states had too much xanax on board - agitated   . Anginal pain     "many many years ago"  . Exertional shortness of breath   . Pneumonia, organism unspecified 11/07/2010    Assoc with R Parapneumonic effusion 09/2010    - Tapped 10/05/10    - CxR resolved 11/07/2010    . Headache     "monthly" (09/06/2014)  . Arthritis     "hips" (09/06/2014)  . Myocardial infarction    Past Surgical History  Procedure Laterality Date  . Esophagogastroduodenoscopy  12-29-09    dr qadeer at [facility], several gastric ulcers  . Laparoscopic nissen fundoplication    . 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 Repair of perforated ulcer    . Biopsy thyroid  08-13-11    benign nodule, per Dr. Melida Quitter   .  Esophagogastroduodenoscopy N/A 09/25/2012    Procedure: ESOPHAGOGASTRODUODENOSCOPY (EGD);  Surgeon: Beryle Beams, MD;  Location: [facility];  Service: Endoscopy;  Laterality: N/A;  . Fracture surgery    . Tonsillectomy    . Cataract extraction w/ intraocular lens  implant, bilateral Bilateral   . Laparoscopic partial gastrectomy N/A 06/29/2013    Procedure: Gastrectomy and GJ Tube placement;  Surgeon: Pedro Earls, MD;  Location: [facility];  Service: General;  Laterality: N/A;  . Colonoscopy    . Gastrostomy N/A 07/26/2014    Procedure: LAPRASCOPIC ASSISTED OPEN PLACEMENT OF JEJUNOSTOMY TUBE;  Surgeon: Pedro Earls, MD;  Location: [facility];  Service: General;  Laterality: N/A;  . Orif hip fracture Bilateral     "pins in both of them"  . Dilation and curettage of uterus    . Hernia repair      "hiatal"  . Joint replacement     Current Outpatient Prescriptions on File Prior to Visit  Medication Sig Dispense Refill  . aspirin EC 81 MG tablet Take 81 mg by mouth daily.    . ergocalciferol (VITAMIN D2) 50000 UNITS capsule Take 1 capsule (50,000 Units total) by mouth once a week. 24 capsule 0  . Multiple Vitamin (MULTIVITAMIN WITH MINERALS) TABS tablet Take 1 tablet by mouth daily.    . Multiple Vitamins-Minerals (OCUVITE PO) Take 1 tablet by mouth daily.    Marland Kitchen omeprazole (PRILOSEC) 40 MG capsule Take 1 capsule (40 mg total) by mouth 2 (two) times daily. 180 capsule 3  . ondansetron (ZOFRAN ODT) 4 MG disintegrating tablet Take 1-2 tablets (4-8 mg total) by mouth every 8 (eight) hours as needed for nausea or vomiting. 60 tablet 5   No current facility-administered medications on file prior to visit.   Allergies  Allergen Reactions  . Lithium Nausea And Vomiting  . Celebrex [Celecoxib] Nausea Only  . Morphine Itching    REACTION: itching  . Quetiapine     REACTION: tardive dyskinesia  . Varenicline Tartrate     REACTION: unspecified   Family History  Problem Relation Age of Onset  .  Cancer Mother     kidney, magliant tumor on face  . Stroke Mother   . Celiac disease Father   . Cancer Father     kidney and pancreatic   Social History   Social History  . Marital Status: Divorced    Spouse Name: N/A  . Number of Children: N/A  .  Years of Education: N/A   Social History Main Topics  . Smoking status: Current Every Day Smoker -- 2.00 packs/day for 50 years    Types: Cigarettes  . Smokeless tobacco: Never Used  . Alcohol Use: No  . Drug Use: No  . Sexual Activity: No   Other Topics Concern  . None   Social History Narrative     Review of Systems  Constitutional: Positive for diaphoresis, activity change, appetite change and fatigue. Negative for fever, chills and unexpected weight change.  Respiratory: Positive for cough.   Cardiovascular: Negative for chest pain and leg swelling.  Gastrointestinal: Positive for nausea, abdominal pain and diarrhea. Negative for vomiting, blood in stool, abdominal distention and anal bleeding.  Genitourinary: Negative for dysuria, decreased urine volume and difficulty urinating.  Musculoskeletal: Negative for gait problem.  Skin: Positive for color change.  Allergic/Immunologic: Negative for immunocompromised state.  Neurological: Positive for weakness. Negative for dizziness, syncope and numbness.  Hematological: Bruises/bleeds easily.  Psychiatric/Behavioral: Positive for confusion, sleep disturbance, dysphoric mood, decreased concentration and agitation. Negative for hallucinations and self-injury. The patient is nervous/anxious. The patient is not hyperactive.        Objective:   Physical Exam  Constitutional: She is oriented to person, place, and time. Vital signs are normal. She appears well-developed. She appears cachectic. She has a sickly appearance. No distress.  HENT:  Head: Normocephalic and atraumatic.  Right Ear: External ear normal.  Left Ear: External ear normal.  Eyes: Conjunctivae are normal. No  scleral icterus.  Neck: Normal range of motion. Neck supple. No thyromegaly present.  Cardiovascular: Normal rate, regular rhythm, normal heart sounds and intact distal pulses.   Pulmonary/Chest: Effort normal and breath sounds normal. No respiratory distress.  Musculoskeletal: She exhibits no edema.  Lymphadenopathy:    She has no cervical adenopathy.  Neurological: She is alert and oriented to person, place, and time.  Skin: Skin is warm and dry. She is not diaphoretic. No erythema.  Psychiatric: Her speech is normal and behavior is normal. Her affect is blunt. She exhibits a depressed mood. She exhibits abnormal recent memory. She exhibits normal remote memory.   BP 114/73 mmHg  Pulse 73  Temp(Src) 99.6 F (37.6 C) (Oral)  Resp 16  Ht 5' 4.5" (1.638 m)  Wt 97 lb 7.5 oz (44.212 kg)  BMI 16.48 kg/m2    [facility] reading (PRIMARY) by  Dr. Brigitte Pulse. Humerus: normal Dg Humerus Left  02/24/2015   CLINICAL DATA:  Left upper arm injury and pain due to fall 2 days ago  EXAM: LEFT HUMERUS - 2+ VIEW  COMPARISON:  None in PACs  FINDINGS: The humerus is adequately mineralized for age. There is no acute fracture. There is no lytic nor blastic lesion. The observed portions of the shoulder and elbow exhibit no acute abnormalities. The soft tissues of the arm are unremarkable.  IMPRESSION: There is no acute bony abnormality of the left humerus.   Electronically Signed   By: David  Martinique M.D.   On: 02/24/2015 13:20    Assessment & Plan:  Today I have utilized the [place] Controlled Substance Registry's online query to confirm compliance regarding the patient's narcotic pain medications. My review reveals appropriate prescription fills and that [facility] is the sole provider of these medications. Rechecks will occur regularly and the patient is aware of our use of the system.  1. Chronic abdominal pain   2. Chronic pain syndrome   3. History of Roux-en-Y gastric  bypass   4. Tobacco abuse   5.  Weight loss   6. Severe protein-calorie malnutrition   7. Major depressive disorder, recurrent episode, moderate   8. Anxiety state   9. Osteoporosis   10. Secondary hyperparathyroidism   11. Coronary artery disease due to calcified coronary lesion   12. Chronic obstructive pulmonary disease, unspecified COPD, unspecified chronic bronchitis type   13. Medication monitoring encounter   14. Influenza vaccine needed   15. GAD (generalized anxiety disorder)   16. Left upper arm injury, initial encounter - reassured   Called pt's pharmacy to confirm her med list. Needs f/u OV in 1 mo for additional refills due to h/o many violations of controlled drug contract Increase remeron.  Orders Placed This Encounter  Procedures  . DG Humerus Left    Standing Status: Future     Number of Occurrences: 1     Standing Expiration Date: 02/23/2016    Order Specific Question:  Reason for Exam (SYMPTOM  OR DIAGNOSIS REQUIRED)    Answer:  fall with deep bruise, osteoporosis    Order Specific Question:  Preferred imaging location?    Answer:  External  . Flu Vaccine QUAD 36+ mos IM  . Prescript Monitor Profile (9)    Meds ordered this encounter  Medications  . ALPRAZolam (XANAX) 1 MG tablet    Sig: Take 1 tablet (1 mg total) by mouth 4 (four) times daily as needed for anxiety.    Dispense:  60 tablet    Refill:  1    May fill refill after 2 weeks.  Marland Kitchen HYDROcodone-acetaminophen (NORCO) 10-325 MG per tablet    Sig: Take 1 tablet by mouth every 6 (six) hours.    Dispense:  60 tablet    Refill:  0    May fill on or after 2 weeks from date of fill written  . HYDROcodone-acetaminophen (NORCO) 10-325 MG per tablet    Sig: Take 1 tablet by mouth every 6 (six) hours as needed. This is a 2 week supply    Dispense:  60 tablet    Refill:  0  . mirtazapine (REMERON) 45 MG tablet    Sig: Take 1 tablet (45 mg total) by mouth at bedtime.    Dispense:  30 tablet    Refill:  1  . OLANZapine (ZYPREXA) 5 MG  tablet    Sig: Take 1 tablet (5 mg total) by mouth at bedtime.    Dispense:  30 tablet    Refill:  2    Failed risperdal, cannot tolerate seroquel due to tardive dyskinesia  . feeding supplement (BOOST / RESOURCE BREEZE) LIQD    Sig: Take 1 Container by mouth 3 (three) times daily between meals.    Dispense:  90 Container    Refill:  11    Ok to substitute a similar product if cost for pt is better and change is acceptable to pt    [name], MD MPH

## 2015-03-02 LAB — BENZODIAZEPINES (GC/LC/MS), URINE
Alprazolam metabolite (GC/LC/MS), ur confirm: 164 ng/mL — AB (ref <25)
CLONAZEPAU: NEGATIVE ng/mL (ref <25)
FLURAZEPAMU: NEGATIVE ng/mL (ref <50)
LORAZEPAMU: NEGATIVE ng/mL (ref <50)
Midazolam (GC/LC/MS), ur confirm: NEGATIVE ng/mL (ref <50)
NORDIAZEPAMU: NEGATIVE ng/mL (ref <50)
Oxazepam (GC/LC/MS), ur confirm: NEGATIVE ng/mL (ref <50)
TRIAZOLAMU: NEGATIVE ng/mL (ref <50)
Temazepam (GC/LC/MS), ur confirm: NEGATIVE ng/mL (ref <50)

## 2015-03-02 LAB — OPIATES/OPIOIDS (LC/MS-MS)
Codeine Urine: NEGATIVE ng/mL (ref <50)
HYDROCODONE: NEGATIVE ng/mL (ref <50)
Hydromorphone: NEGATIVE ng/mL (ref <50)
Morphine Urine: NEGATIVE ng/mL (ref <50)
NORHYDROCODONE, UR: NEGATIVE ng/mL (ref <50)
OXYCODONE, UR: 11640 ng/mL — AB (ref <50)
Oxymorphone: 2478 ng/mL — AB (ref <50)

## 2015-03-02 LAB — OXYCODONE, URINE (LC/MS-MS)
Oxycodone, ur: 11640 ng/mL — AB (ref <50)
Oxymorphone: 2478 ng/mL — AB (ref <50)

## 2015-03-03 LAB — PRESCRIPTION MONITORING PROFILE (9 PANEL)
AMPHETAMINE/METH: NEGATIVE ng/mL
BARBITURATE SCREEN, URINE: NEGATIVE ng/mL
CANNABINOID SCRN UR: NEGATIVE ng/mL
COCAINE METABOLITES: NEGATIVE ng/mL
CREATININE, URINE: 113.42 mg/dL (ref >20.0)
Methadone Screen, Urine: NEGATIVE ng/mL
NITRITES URINE, INITIAL: NEGATIVE ug/mL
Propoxyphene: NEGATIVE ng/mL
pH, Initial: 5.3 pH (ref 4.5–8.9)

## 2015-03-05 ENCOUNTER — Ambulatory Visit (INDEPENDENT_AMBULATORY_CARE_PROVIDER_SITE_OTHER): Payer: Medicare Other | Admitting: Family Medicine

## 2015-03-05 VITALS — BP 98/68 | HR 86 | Temp 98.2°F | Resp 16 | Ht 63.75 in | Wt 103.2 lb

## 2015-03-05 DIAGNOSIS — G8929 Other chronic pain: Secondary | ICD-10-CM

## 2015-03-05 DIAGNOSIS — G894 Chronic pain syndrome: Secondary | ICD-10-CM

## 2015-03-05 DIAGNOSIS — F331 Major depressive disorder, recurrent, moderate: Secondary | ICD-10-CM

## 2015-03-05 DIAGNOSIS — Z9884 Bariatric surgery status: Secondary | ICD-10-CM | POA: Diagnosis not present

## 2015-03-05 DIAGNOSIS — R109 Unspecified abdominal pain: Secondary | ICD-10-CM | POA: Diagnosis not present

## 2015-03-05 DIAGNOSIS — E43 Unspecified severe protein-calorie malnutrition: Secondary | ICD-10-CM | POA: Diagnosis not present

## 2015-03-05 DIAGNOSIS — J449 Chronic obstructive pulmonary disease, unspecified: Secondary | ICD-10-CM | POA: Diagnosis not present

## 2015-03-05 DIAGNOSIS — R634 Abnormal weight loss: Secondary | ICD-10-CM

## 2015-03-05 DIAGNOSIS — F411 Generalized anxiety disorder: Secondary | ICD-10-CM | POA: Diagnosis not present

## 2015-03-05 MED ORDER — MIRTAZAPINE 45 MG PO TABS: 45.0000 mg | ORAL_TABLET | Freq: Every day | ORAL | Status: DC

## 2015-03-05 MED ORDER — ONDANSETRON 4 MG PO TBDP: 4.0000 mg | ORAL_TABLET | Freq: Three times a day (TID) | ORAL | Status: DC | PRN

## 2015-03-05 MED ORDER — PANTOPRAZOLE SODIUM 40 MG PO TBEC: 40.0000 mg | DELAYED_RELEASE_TABLET | Freq: Two times a day (BID) | ORAL | Status: DC

## 2015-03-05 MED ORDER — HYDROCODONE-ACETAMINOPHEN 10-325 MG PO TABS: 1.0000 | ORAL_TABLET | ORAL | Status: DC | PRN

## 2015-03-05 MED ORDER — ALPRAZOLAM 2 MG PO TABS
2.0000 mg | ORAL_TABLET | Freq: Two times a day (BID) | ORAL | Status: DC | PRN
Start: 2015-03-05 — End: 2015-03-14

## 2015-03-05 MED ORDER — OLANZAPINE 7.5 MG PO TABS: 7.5000 mg | ORAL_TABLET | Freq: Every day | ORAL | Status: DC

## 2015-03-05 MED ORDER — ERGOCALCIFEROL 1.25 MG (50000 UT) PO CAPS: 50000.0000 [IU] | ORAL_CAPSULE | ORAL | Status: DC

## 2015-03-05 NOTE — Patient Instructions (Addendum)
Stress and Stress Management Stress is a normal reaction to life events. It is what you feel when life demands more than you are used to or more than you can handle. Some stress can be useful. For example, the stress reaction can help you catch the last bus of the day, study for a test, or meet a deadline at work. But stress that occurs too often or for too long can cause problems. It can affect your emotional health and interfere with relationships and normal daily activities. Too much stress can weaken your immune system and increase your risk for physical illness. If you already have a medical problem, stress can make it worse. CAUSES  All sorts of life events may cause stress. An event that causes stress for one person may not be stressful for another person. Major life events commonly cause stress. These may be positive or negative. Examples include losing your job, moving into a new home, getting married, having a baby, or losing a loved one. Less obvious life events may also cause stress, especially if they occur day after day or in combination. Examples include working long hours, driving in traffic, caring for children, being in debt, or being in a difficult relationship. SIGNS AND SYMPTOMS Stress may cause emotional symptoms including, the following:  Anxiety. This is feeling worried, afraid, on edge, overwhelmed, or out of control.  Anger. This is feeling irritated or impatient.  Depression. This is feeling sad, down, helpless, or guilty.  Difficulty focusing, remembering, or making decisions. Stress may cause physical symptoms, including the following:   Aches and pains. These may affect your head, neck, back, stomach, or other areas of your body.  Tight muscles or clenched jaw.  Low energy or trouble sleeping. Stress may cause unhealthy behaviors, including the following:   Eating to feel better (overeating) or skipping meals.  Sleeping too little, too much, or both.  Working  too much or putting off tasks (procrastination).  Smoking, drinking alcohol, or using drugs to feel better. DIAGNOSIS  Stress is diagnosed through an assessment by your health care provider. Your health care provider will ask questions about your symptoms and any stressful life events.Your health care provider will also ask about your medical history and may order blood tests or other tests. Certain medical conditions and medicine can cause physical symptoms similar to stress. Mental illness can cause emotional symptoms and unhealthy behaviors similar to stress. Your health care provider may refer you to a mental health professional for further evaluation.  TREATMENT  Stress management is the recommended treatment for stress.The goals of stress management are reducing stressful life events and coping with stress in healthy ways.  Techniques for reducing stressful life events include the following:  Stress identification. Self-monitor for stress and identify what causes stress for you. These skills may help you to avoid some stressful events.  Time management. Set your priorities, keep a calendar of events, and learn to say "no." These tools can help you avoid making too many commitments. Techniques for coping with stress include the following:  Rethinking the problem. Try to think realistically about stressful events rather than ignoring them or overreacting. Try to find the positives in a stressful situation rather than focusing on the negatives.  Exercise. Physical exercise can release both physical and emotional tension. The key is to find a form of exercise you enjoy and do it regularly.  Relaxation techniques. These relax the body and mind. Examples include yoga, meditation, tai chi, biofeedback, deep  breathing, progressive muscle relaxation, listening to music, being out in nature, journaling, and other hobbies. Again, the key is to find one or more that you enjoy and can do  regularly.  Healthy lifestyle. Eat a balanced diet, get plenty of sleep, and do not smoke. Avoid using alcohol or drugs to relax.  Strong support network. Spend time with family, friends, or other people you enjoy being around.Express your feelings and talk things over with someone you trust. Counseling or talktherapy with a mental health professional may be helpful if you are having difficulty managing stress on your own. Medicine is typically not recommended for the treatment of stress.Talk to your health care provider if you think you need medicine for symptoms of stress. HOME CARE INSTRUCTIONS  Keep all follow-up visits as directed by your health care provider.  Take all medicines as directed by your health care provider. SEEK MEDICAL CARE IF:  Your symptoms get worse or you start having new symptoms.  You feel overwhelmed by your problems and can no longer manage them on your own. SEEK IMMEDIATE MEDICAL CARE IF:  You feel like hurting yourself or someone else. Document Released: 12/04/2000 Document Revised: 10/25/2013 Document Reviewed: 02/02/2013 [facility] Patient Information 2015 [place], [place]. This information is not intended to replace advice given to you by your health care provider. Make sure you discuss any questions you have with your health care provider.  Stress Stress-related medical problems are becoming increasingly common. The body has a built-in physical response to stressful situations. Faced with pressure, challenge or danger, we need to react quickly. Our bodies release hormones such as cortisol and adrenaline to help do this. These hormones are part of the "fight or flight" response and affect the metabolic rate, heart rate and blood pressure, resulting in a heightened, stressed state that prepares the body for optimum performance in dealing with a stressful situation. It is likely that early man required these mechanisms to stay alive, but usually modern stresses  do not call for this, and the same hormones released in today's world can damage health and reduce coping ability. CAUSES  Pressure to perform at work, at school or in sports.  Threats of physical violence.  Money worries.  Arguments.  Family conflicts.  Divorce or separation from significant other.  Bereavement.  New job or unemployment.  Changes in location.  Alcohol or drug abuse. SOMETIMES, THERE IS NO PARTICULAR REASON FOR DEVELOPING STRESS. Almost all people are at risk of being stressed at some time in their lives. It is important to know that some stress is temporary and some is long term.  Temporary stress will go away when a situation is resolved. Most people can cope with short periods of stress, and it can often be relieved by relaxing, taking a walk or getting any type of exercise, chatting through issues with friends, or having a good night's sleep.  Chronic (long-term, continuous) stress is much harder to deal with. It can be psychologically and emotionally damaging. It can be harmful both for an individual and for friends and family. SYMPTOMS Everyone reacts to stress differently. There are some common effects that help [place] recognize it. In times of extreme stress, people may:  Shake uncontrollably.  Breathe faster and deeper than normal (hyperventilate).  Vomit.  For people with asthma, stress can trigger an attack.  For some people, stress may trigger migraine headaches, ulcers, and body pain. PHYSICAL EFFECTS OF STRESS MAY INCLUDE:  Loss of energy.  Skin problems.  Aches  and pains resulting from tense muscles, including neck ache, backache and tension headaches.  Increased pain from arthritis and other conditions.  Irregular heart beat (palpitations).  Periods of irritability or anger.  Apathy or depression.  Anxiety (feeling uptight or worrying).  Unusual behavior.  Loss of appetite.  Comfort eating.  Lack of concentration.  Loss  of, or decreased, sex-drive.  Increased smoking, drinking, or recreational drug use.  For women, missed periods.  Ulcers, joint pain, and muscle pain. Post-traumatic stress is the stress caused by any serious accident, strong emotional damage, or extremely difficult or violent experience such as rape or war. Post-traumatic stress victims can experience mixtures of emotions such as fear, shame, depression, guilt or anger. It may include recurrent memories or images that may be haunting. These feelings can last for weeks, months or even years after the traumatic event that triggered them. Specialized treatment, possibly with medicines and psychological therapies, is available. If stress is causing physical symptoms, severe distress or making it difficult for you to function as normal, it is worth seeing your caregiver. It is important to remember that although stress is a usual part of life, extreme or prolonged stress can lead to other illnesses that will need treatment. It is better to visit a doctor sooner rather than later. Stress has been linked to the development of high blood pressure and heart disease, as well as insomnia and depression. There is no diagnostic test for stress since everyone reacts to it differently. But a caregiver will be able to spot the physical symptoms, such as:  Headaches.  Shingles.  Ulcers. Emotional distress such as intense worry, low mood or irritability should be detected when the doctor asks pertinent questions to identify any underlying problems that might be the cause. In case there are physical reasons for the symptoms, the doctor may also want to do some tests to exclude certain conditions. If you feel that you are suffering from stress, try to identify the aspects of your life that are causing it. Sometimes you may not be able to change or avoid them, but even a small change can have a positive ripple effect. A simple lifestyle change can make all the  difference. STRATEGIES THAT CAN HELP DEAL WITH STRESS:  Delegating or sharing responsibilities.  Avoiding confrontations.  Learning to be more assertive.  Regular exercise.  Avoid using alcohol or street drugs to cope.  Eating a healthy, balanced diet, rich in fruit and vegetables and proteins.  Finding humor or absurdity in stressful situations.  Never taking on more than you know you can handle comfortably.  Organizing your time better to get as much done as possible.  Talking to friends or family and sharing your thoughts and fears.  Listening to music or relaxation tapes.  Relaxation techniques like deep breathing, meditation, and yoga.  Tensing and then relaxing your muscles, starting at the toes and working up to the head and neck. If you think that you would benefit from help, either in identifying the things that are causing your stress or in learning techniques to help you relax, see a caregiver who is capable of helping you with this. Rather than relying on medications, it is usually better to try and identify the things in your life that are causing stress and try to deal with them. There are many techniques of managing stress including counseling, psychotherapy, aromatherapy, yoga, and exercise. Your caregiver can help you determine what is best for you. Document Released: 08/31/2002 Document Revised:  06/15/2013 Document Reviewed: 07/28/2007 [employer] Patient Information 2015 [place], [place]. This information is not intended to replace advice given to you by your health care provider. Make sure you discuss any questions you have with your health care provider.
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
## 2015-03-08 NOTE — Progress Notes (Signed)
Appointment made for 03-30-15 at 10:45

## 2015-03-14 ENCOUNTER — Telehealth: Payer: Self-pay

## 2015-03-14 ENCOUNTER — Ambulatory Visit (INDEPENDENT_AMBULATORY_CARE_PROVIDER_SITE_OTHER): Payer: Medicare Other | Admitting: Family Medicine

## 2015-03-14 ENCOUNTER — Encounter: Payer: Self-pay | Admitting: Family Medicine

## 2015-03-14 VITALS — BP 118/76 | Temp 97.7°F | Resp 16 | Ht 64.0 in | Wt 102.2 lb

## 2015-03-14 DIAGNOSIS — F411 Generalized anxiety disorder: Secondary | ICD-10-CM | POA: Diagnosis not present

## 2015-03-14 DIAGNOSIS — G894 Chronic pain syndrome: Secondary | ICD-10-CM | POA: Diagnosis not present

## 2015-03-14 MED ORDER — HYDROCODONE-ACETAMINOPHEN 10-325 MG PO TABS: 1.0000 | ORAL_TABLET | Freq: Four times a day (QID) | ORAL | Status: DC | PRN

## 2015-03-14 MED ORDER — ALPRAZOLAM 1 MG PO TABS: 1.0000 mg | ORAL_TABLET | Freq: Three times a day (TID) | ORAL | Status: DC | PRN

## 2015-03-14 MED ORDER — DIAZEPAM 5 MG PO TABS: 5.0000 mg | ORAL_TABLET | Freq: Two times a day (BID) | ORAL | Status: DC | PRN
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
## 2015-03-14 NOTE — Telephone Encounter (Signed)
Dr Brigitte Pulse  Patient was seen today she has questions regarding contacting [facility]  How does she go about getting in touch with them?    [phone]

## 2015-03-14 NOTE — Patient Instructions (Addendum)
I would like to get you over to [facility] - [phone] [address] [place] [address], [place] [address] Phone [phone]  or to Dr. Marciano Sequin at [facility] [phone] There is also [facility] at [phone] Go to [facility] and get your records and then drop them off at [facility].  I want to see you back in 2 weeks.  You HAVE TO BRING ALL OF YOUR MEDICINES TO YOUR NEXT VISIT - ALL OF THEM - NO REFILLS WITHOUT.  I ALSO WANT YOU TO HAVE APPOINTMENTS SCHEDULED AT YOUR NEXT VISIT - WE WILL REFILL MEDS TO GET YOU TO THEN.  IF YOU AT ANY POINT ARE HAVING TROUBLE, CALL ME AND LEAVE A MESSAGE SO WE CAN HELP.  Make an appointment at [facility].  I want you to have made an appointment there (or somewhere else) by tomorrow. I want to hear from you on Thursday as to when your   You can call the 24-hour [facility] at [phone] or [phone] for immediate assistance. Among several different types of services, they offer an Intensive oupatient program for mood disorders - which is a group type setting Monday-Friday 9-noon.  You can schedule an assessment by calling the above numbers during which the costs for the program and insurance benefits will be reviewed.    No psychological or psychiatric services take physician referrals - they always want the patient to call. Some excellent private psychiatrists for individual counseling are:  [facility]  [place], [place] [address] Phone: [phone]  [facility]  [address] [place], [place] [address]  Phone:([phone]) [place] [facility] [address] [place], [place], [place] [address] Phone: [phone] [facility]  [address] Carolynne Edouard [place], [place] [address]  Phone:([phone]) [place], [place], [place] [address], [place],  [address] Phone: [phone]

## 2015-03-14 NOTE — Telephone Encounter (Signed)
Patient was seen today and she's calling because she lost the list the psychiatric names and phone numbers that Dr. Brigitte Pulse gave her. Please call!

## 2015-03-14 NOTE — Telephone Encounter (Signed)
Spoke with pt, advised her a left a copy of her AVS with the information on this up front. She will come by to pick up.

## 2015-03-14 NOTE — Telephone Encounter (Signed)
Dr Brigitte Pulse  Pt called again inquiring about contacting [facility].  [phone]

## 2015-03-15 ENCOUNTER — Encounter: Payer: Self-pay | Admitting: Radiology

## 2015-03-15 NOTE — Telephone Encounter (Signed)
I spoke with Pt. I advised her of your message. She said a friend of her suggested that she get hospice, and she thought it was a good idea because she says she needs help at home. I just wanted to let you be aware that she keeps insisting to be set up for hospice.

## 2015-03-15 NOTE — Telephone Encounter (Signed)
????    I did not advise pt to contact hospice.  I advised pt to contact [facility] to get an appointment with a Vernita Tague for medication management and I advised pt to get her records from [facility] and sign a release so that she can bring her records to [facility] or [facility].  See AVS for the contact info

## 2015-03-15 NOTE — Telephone Encounter (Signed)
Left message for Pt to call back.

## 2015-03-16 NOTE — Telephone Encounter (Signed)
Hospice is always happy to evaluate anyone. However, she will not qualify as she has a life expectancy of over 6 months time.  She has a lot of medical problems and challenges but her weight and malnutrition are not worsening, and she has been living with all of her medical problems for years. I will call pt to discuss.

## 2015-03-23 ENCOUNTER — Ambulatory Visit: Payer: Medicare Other | Admitting: Family Medicine

## 2015-03-26 ENCOUNTER — Encounter: Payer: Self-pay | Admitting: Family Medicine

## 2015-03-26 ENCOUNTER — Ambulatory Visit (INDEPENDENT_AMBULATORY_CARE_PROVIDER_SITE_OTHER): Payer: Medicare Other | Admitting: Family Medicine

## 2015-03-26 VITALS — BP 90/60 | HR 113 | Temp 97.8°F | Resp 17 | Ht 63.75 in | Wt 100.6 lb

## 2015-03-26 DIAGNOSIS — K909 Intestinal malabsorption, unspecified: Secondary | ICD-10-CM | POA: Diagnosis not present

## 2015-03-26 DIAGNOSIS — G894 Chronic pain syndrome: Secondary | ICD-10-CM | POA: Diagnosis not present

## 2015-03-26 DIAGNOSIS — R109 Unspecified abdominal pain: Secondary | ICD-10-CM | POA: Diagnosis not present

## 2015-03-26 DIAGNOSIS — G8929 Other chronic pain: Secondary | ICD-10-CM

## 2015-03-26 DIAGNOSIS — E43 Unspecified severe protein-calorie malnutrition: Secondary | ICD-10-CM

## 2015-03-26 MED ORDER — FUROSEMIDE 20 MG PO TABS: 20.0000 mg | ORAL_TABLET | Freq: Two times a day (BID) | ORAL | Status: DC

## 2015-03-26 MED ORDER — ALPRAZOLAM 1 MG PO TABS: 1.0000 mg | ORAL_TABLET | Freq: Three times a day (TID) | ORAL | Status: DC | PRN

## 2015-03-26 MED ORDER — BOOST / RESOURCE BREEZE PO LIQD: 1.0000 | Freq: Three times a day (TID) | ORAL | Status: DC

## 2015-03-26 MED ORDER — HYDROCODONE-ACETAMINOPHEN 10-325 MG PO TABS: 1.0000 | ORAL_TABLET | Freq: Four times a day (QID) | ORAL | Status: DC | PRN

## 2015-03-26 NOTE — Progress Notes (Addendum)
Subjective:  This chart was scribed for [name], MD by [name], Medical Scribe. This patient was seen in Room 12 and the patient's care was started 8:38 AM.    Patient ID: [name], female    DOB: [birth year], 64 y.o.   MRN: [id number] Chief Complaint  Patient presents with  . Follow-up    meds  . Leg Swelling    x2 days now with pain  . Foot Swelling    HPI [name] is a 64 y.o. female who presents to [facility] for follow up.  Pt has severe chronic abd pain after numerous amount of abd surgeries. She also has recurrent severe depression and anxiety with concern of secondary gain of her opiates due to history of Roux-en-Y gastric bypass. She has severe malabsorption issues when combined with her abd pain has led to malnutrition. Pt has had recurrent difficulties with complying with instructions on how to use and take her pain medication and frequently uses prescriptions in half the time as intended. Unfortunately, her misuse is very unclear. There is some concern about diversion to her roommate that patient may be unaware of. Also some significant concern about pt's memory and cognition. Despite this, I think that her intentions are to follow medical instructions and her pain and symptoms are quite real. Because of this, I've been seeing this pt every 2 weeks so she has less total medications at any given time. At our last visit, we discussed it was clear that the levels of medication that I was prescribing her were not sufficient to treat her medical condition. And so, we referred her to pain medicine for the 3rd time. I did not place referral yet as I was waiting on patient to receive records from her [facility] clinic.   Pt had been on prozac for decades and due to uncontrolled symptoms. I weaned her off of this and put her on remeron, moved up to 55. However, pt's depression symptoms have been steadily worsening up to severe point. And so, at last visit, I recommended her to establish  with psychiatrist for medication. I perform regular checks with pt's pharmacist and she is filling her prescriptions appropriately. However, I have been concerned that she is not taking them as instructed.   She's here because her legs are sore and swelling noticed 2 days ago. She describes her legs are throbbing and swelling. They were worse few nights ago. She has been drinking more water. She denies change in her diet.   She has made an appointment at [facility]. She doesn't feel any changes with her anxiety. She's been busy dealing with her condo and moving out.  She is still smoking.   Past Medical History  Diagnosis Date  . Peptic ulcer disease     dr Oletta Lamas  . Acute duodenal ulcer with hemorrhage and perforation, with obstruction ([place])   . Barrett's esophagus   . Menopause   . GERD (gastroesophageal reflux disease)   . Asthma   . Chronic abdominal pain     narcotic dependence, dr gyarteng-dak at [facility]  . Narcotic abuse   . Anxiety     sees [name] NP at Dr. Radonna Ricker office  . Fx two ribs-open 08-24-11    left 4th and 5th  . Fx of fibula 07-28-11    left fibula, 2 places   . COPD (chronic obstructive pulmonary disease) ([place])   . Allergy   . Anemia   . History  of blood transfusion     "related to OR; maybe when I perforated that ulcer"  . Lap Nissen + truncal vagotomy July 2008 05/13/2013  . LEG EDEMA 01/19/2008    Qualifier: Diagnosis of  By: [name] MD, [name]   . ACUTE DUODEN ULCER W/HEMORR PERF&OBSTRUCTION 06/26/2004    Qualifier: Diagnosis of  By: [name] MD, [name]   . FEVER, RECURRENT 08/11/2009    Qualifier: Diagnosis of  By: [name] MD, [name] MENOPAUSE 01/07/2007    Qualifier: Diagnosis of  By: [name], CMA, [place]    . Gastroparesis 06/03/2007    Qualifier: Diagnosis of  By: [name] MD, [name]   . GASTRIC OUTLET OBSTRUCTION 08/28/2007    In July 2008 she underwent laparoscopic enterolysis, Nissen fundoplication over a #43 bougie, single  pledgeted suture colosure of the hiatus and a 3 suture wrap.  Lap truncal vagotomy and a loop gastrojejunostomy was performed.  This was revised in August of 2009 to a roux en Y gastrojejujostomy.     . 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 Depression   . History of hiatal hernia   . Complication of anesthesia     pt states had too much xanax on board - agitated   . Anginal pain ([place])     "many many years ago"  . Exertional shortness of breath   . Pneumonia, organism unspecified 11/07/2010    Assoc with R Parapneumonic effusion 09/2010    - Tapped 10/05/10    - CxR resolved 11/07/2010    . Headache     "monthly" (09/06/2014)  . Arthritis     "hips" (09/06/2014)  . Myocardial infarction [facility])    Prior to Admission medications   Medication Sig Start Date End Date Taking? Authorizing Provider  ALPRAZolam [name]) 1 MG tablet Take 1 tablet (1 mg total) by mouth 3 (three) times daily as needed for anxiety. 03/14/15   Shawnee Knapp, MD  aspirin EC 81 MG tablet Take 81 mg by mouth daily.    Historical Provider, MD  diazepam (VALIUM) 5 MG tablet Take 1 tablet (5 mg total) by mouth every 12 (twelve) hours as needed for anxiety. 03/14/15   Shawnee Knapp, MD  ergocalciferol (VITAMIN D2) 50000 UNITS capsule Take 1 capsule (50,000 Units total) by mouth once a week. 03/05/15   Shawnee Knapp, MD  feeding supplement (BOOST / RESOURCE BREEZE) LIQD Take 1 Container by mouth 3 (three) times daily between meals. 02/23/15   Shawnee Knapp, MD  HYDROcodone-acetaminophen (NORCO) 10-325 MG per tablet Take 1-2 tablets by mouth every 6 (six) hours as needed for moderate pain. This is a 2 week supply 03/14/15   Shawnee Knapp, MD  mirtazapine (REMERON) 45 MG tablet Take 1 tablet (45 mg total) by mouth at bedtime. 03/05/15   Shawnee Knapp, MD  Multiple Vitamin (MULTIVITAMIN WITH MINERALS) TABS tablet Take 1 tablet by mouth daily.    Historical Provider, MD  Multiple Vitamins-Minerals (OCUVITE PO) Take 1 tablet by mouth  daily.    Historical Provider, MD  OLANZapine (ZYPREXA) 7.5 MG tablet Take 1 tablet (7.5 mg total) by mouth at bedtime. 03/05/15   Shawnee Knapp, MD  ondansetron (ZOFRAN ODT) 4 MG disintegrating tablet Take 1-2 tablets (4-8 mg total) by mouth every 8 (eight) hours as needed for nausea or vomiting. 03/05/15   Shawnee Knapp, MD  pantoprazole (PROTONIX) 40 MG tablet Take 1 tablet (40 mg  total) by mouth 2 (two) times daily before a meal. 03/05/15   Shawnee Knapp, MD   Allergies  Allergen Reactions  . Lithium Nausea And Vomiting  . Celebrex [Celecoxib] Nausea Only  . Morphine Itching    REACTION: itching  . Quetiapine     REACTION: tardive dyskinesia  . Varenicline Tartrate     REACTION: unspecified     Review of Systems  Constitutional: Negative for fever and fatigue.  Respiratory: Negative for shortness of breath.   Cardiovascular: Positive for leg swelling.  Gastrointestinal: Negative for nausea, vomiting, diarrhea and constipation.  Genitourinary: Negative for dysuria and frequency.  Musculoskeletal: Positive for myalgias (lower extremities). Negative for gait problem.  Skin: Negative for rash and wound.       Objective:   Physical Exam  Constitutional: She is oriented to person, place, and time. She appears well-developed and well-nourished. No distress.  HENT:  Head: Normocephalic and atraumatic.  Eyes: EOM are normal. Pupils are equal, round, and reactive to light.  Neck: Neck supple.  Cardiovascular: Normal rate.   Pulmonary/Chest: Effort normal and breath sounds normal. No respiratory distress.  Musculoskeletal: Normal range of motion.  1+ pedal edema bilaterally, right worse than left  Neurological: She is alert and oriented to person, place, and time.  Skin: Skin is warm and dry.  Psychiatric: She has a normal mood and affect. Her behavior is normal.  Nursing note and vitals reviewed.   BP 90/60 mmHg  Pulse 113  Temp(Src) 97.8 F (36.6 C) (Oral)  Resp 17  Ht 5' 3.75"  (1.619 m)  Wt 100 lb 9.6 oz (45.632 kg)  BMI 17.41 kg/m2  SpO2 93%       Assessment & Plan:   1. Chronic pain syndrome -  Pt needs to see pain management as I feel sure she does have chronic pain from her numerous abd surgeries and scarring. GI hx understandably causes increase pain with eating/digestion.  Pt failed oxycodone so is on hydrocodone 10 - at last OV was hydrocodone was increased from 10 qid to 20 qid. However, now she is at the limit of the daily acetaminophen allowance but she failed oxycodone. Pt instructed to get her records from [facility] and then call me so I can place a referral to a new pain clinic and she can drop off her records at the new clinic. Pt would really benefit form a longer term medication - I think a fetanyl patch or MS Contin - as she does have constant pain.  Will place referral to [facility] or Dr. Mee Hives after she has her records.  She was seen at [facility] on 2 different occasions but she never followed-up there for any sig length of time as the atmosphere was so abhorent to her. 2 wk supply of medication refilled - often pt will run out much to early when given 1 mo at a time.  2. Protein-calorie malnutrition, severe ([place])   3. Chronic abdominal pain   4. Malabsorption   5.   Major depressive disorder, recurrent - Pt has an appt with a new psychiatrist in sev wks - she had been on paxil for decades prior but her mood sxs were uncontrolled so about 6 mos prior I weaned her off of it and onto mirtazapine which she has been on 45 mg for > 36mos since when pt's mood sxs have sig worsened. I suspect she will need to be taken off of mirtazapine and would recommend trial of cymbalta  next due to pt's pain issues but will defer to psych.   Meds ordered this encounter  Medications  . DISCONTD: furosemide (LASIX) 20 MG tablet    Sig: Take 1 tablet (20 mg total) by mouth 2 (two) times daily. Take 1 with breakfast and 1 with lunch as needed for leg swelling    Dispense:   30 tablet    Refill:  1  . HYDROcodone-acetaminophen (NORCO) 10-325 MG tablet    Sig: Take 1-2 tablets by mouth every 6 (six) hours as needed for moderate pain. This is a 2 week supply    Dispense:  120 tablet    Refill:  0    May fill on or after 03/29/2015  . ALPRAZolam (XANAX) 1 MG tablet    Sig: Take 1 tablet (1 mg total) by mouth 3 (three) times daily as needed for anxiety.    Dispense:  42 tablet    Refill:  0  . feeding supplement (BOOST / RESOURCE BREEZE) LIQD    Sig: Take 1 Container by mouth 3 (three) times daily between meals.    Dispense:  90 Container    Refill:  [place] to substitute a similar product if cost for pt is better and change is acceptable to pt   Over 25 min spent in face-to-face evaluation of and consultation with patient and coordination of care.  Over 50% of this time was spent counseling this patient.  I personally performed the services described in this documentation, which was scribed in my presence. The recorded information has been reviewed and considered, and addended by me as needed.  [name], MD MPH  By signing my name below, I, [name], attest that this documentation has been prepared under the direction and in the presence of [name], MD. Electronically Signed: [name], [place]. 03/26/2015 , 8:38 AM .

## 2015-03-26 NOTE — Patient Instructions (Signed)
Please call and leave a message with the nurse as to who your psychiatric appt is with and when.   Call and leave a message after you get your records from [facility] and I will let you know who to drop a copy off with.  I would recommend asking [facility] for several copies or make copies of what they give you just in case the pain management clinic where you drop off your records declines to accept you, then you don't have to go back and get them before we can try somewhere else.  It is likely just fine to continue having a coke soda with meals. I agree that you should probably not drink diet.  Make sure you are eating a lot of PROTEIN (not carbs) along with this and then drinking a large glass of water after with this as some of the high salt level in the coke could be contributing to your swelling.  Take the lasix as needed. Make sure you are getting plenty of water and potassium in your diet whenever you take it. No more than twice a day on the lasix.  High Protein Diet A high protein diet means that high protein foods are added to your diet. Getting more protein in the diet is important for a number of reasons. Protein helps the body to build tissue, muscle, and to repair damage. People who have had surgery, injuries such as broken bones, infections, and burns, or illnesses such as cancer, may need more protein in their diet.  SERVING SIZES Measuring foods and serving sizes helps to make sure you are getting the right amount of food. The list below tells how big or small some common serving sizes are.   1 oz.........4 stacked dice.  3 oz........Marland KitchenDeck of cards.  1 tsp.......Marland KitchenTip of little finger.  1 tbs......Marland KitchenMarland KitchenThumb.  2 tbs.......Marland KitchenGolf ball.   cup......Marland KitchenHalf of a fist.  1 cup.......Marland KitchenA fist. FOOD SOURCES OF PROTEIN Listed below are some food sources of protein and the amount of protein they contain. Your Registered Dietitian can calculate how many grams of protein you need for your medical  condition. High protein foods can be added to the diet at mealtime or as snacks. Be sure to have at least 1 protein-containing food at each meal and snack to ensure adequate intake.  Meats and Meat Substitutes / Protein (g)  3 oz poultry (chicken, [place]) / 26 g  3 oz tuna, canned in water / 26 g  3 oz fish (cod) / 21 g  3 oz red meat (beef, pork) / 21 g  4 oz tofu / 9 g  1 egg / 6 g   cup egg substitute / 5 g  1 cup dried beans / 15 g  1 cup soy milk / 4 g Dairy / Protein (g)  1 cup milk (skim, 1%, 2%, whole) / 8 g   cup evaporated milk / 9 g  1 cup buttermilk / 8 g  1 cup low-fat plain yogurt / 11 g  1 cup regular plain yogurt / 9 g   cup cottage cheese / 14 g  1 oz cheddar cheese / 7 g Nuts / Protein (g)  2 tbs peanut butter / 8 g  1 oz peanuts / 7 g  2 tbs cashews / 5 g  2 tbs almonds / 5 g Document Released: 06/10/2005 Document Revised: 09/02/2011 Document Reviewed: 03/13/2007 [employer] Patient Information 2015 [place], [place]. This information is not intended to replace advice given to  you by your health care provider. Make sure you discuss any questions you have with your health care provider.  High Protein, High Calorie Diet A high protein, high calorie diet increases the amount of protein and calories you eat. You may need more protein and calories in your diet because of illness, surgery, injury, weight loss, or having a poor appetite. Eating high protein and high calorie foods can help you gain weight, heal, and recover after illness.  SERVING SIZES Measuring foods and serving sizes helps to make sure you are getting the right amount of food. The list below tells how big or small some common serving sizes are.   1 oz.........4 stacked dice.  3 oz........Marland KitchenDeck of cards.  1 tsp.......Marland KitchenTip of little finger.  1 tbs......Marland KitchenMarland KitchenThumb.  2 tbs.......Marland KitchenGolf ball.   cup......Marland KitchenHalf of a fist.  1 cup.......Marland KitchenA fist. HIGH PROTEIN FOODS Dairy  Whole  milk.  Whole milk yogurt.  Powdered milk.  Cheese.  Yahoo.  Instant breakfast products.  Eggnog. Tips for adding to your diet:  Use whole milk when making hot cereal, puddings, soups, and hot cocoa.  Add powdered milk to baked goods, smoothies, and milkshakes.  Make whole milk yogurt parfaits by adding granola, fruit, or nuts.  Add cheese to sandwiches, pastas, soups, and casseroles.  Add fruit to cottage cheese. Meat   Beef, pork, and poultry.  Fish and seafood.  Peanut butter.  Dried beans.  Eggs. Tips for adding to your diet:  Make meat and cheese omelets.  Add eggs to salads and baked goods.  Add fish and seafood to salads.  Add meat and poultry to casseroles, salads, and soups.  Use peanut butter as a topping for pretzels, celery, crackers, or add it to baked goods.  Use beans in casseroles, dips, and spreads. GENERAL GUIDELINES TO INCREASE CALORIES  Replace calorie-free drinks with calorie-containing drinks, such as milk, fruit juices, regular soda, milkshakes, and hot chocolate.  Try to eat 6 small meals instead of 3 large meals each day.  Keep snacks handy, such as nuts, trail mixes, dried fruit, and yogurt.  Choose foods with sauces and gravies.  Add dried fruits, honey, and half-and-half to hot or cold cereal.  Add extra fats when possible, such as butter, sour cream, cream cheese, and salad dressings.  Add cheese to foods often.  Consider adding a clear liquid nutritional supplement to your diet. Your caregiver can give you recommendations. HIGH CALORIE FOODS Grain/Starch  Baked goods, such as muffins and quick breads.  Croissants.  Pancakes and waffles. Vegetable   Sauted vegetables in oil.  Fried vegetables.  Salad greens with regular salad dressing or vinegar and oil. Fruit  Dried fruit.  Canned fruit in syrup.  Fruit juice. Fat  Avocado.  Butter or margarine.  Whipped cream.  Mayonnaise.  Salad  dressing.  Peanuts and mixed nuts.  Cream cheese and sour cream. Sweets and Dessert  Cake.  Cookies.  Pie.  Ice cream.  Doughnuts and pastries.  Protein and meal replacement bars.  Jam, preserves, and jelly.  Candy bars.  Chocolate.  Chocolate, caramel, or other flavored syrups. Document Released: 06/10/2005 Document Revised: 09/02/2011 Document Reviewed: 03/13/2007 [facility] Patient Information 2015 [place], [place]. This information is not intended to replace advice given to you by your health care provider. Make sure you discuss any questions you have with your health care provider.  Malnutrition Many of [place] think of malnutrition as a condition in which there is not enough to eat. Malnutrition is  actually any condition where nutrition is poor. This means:  Too much to eat as we see in conditions of obesity.  Too little to eat with starvation. The following information is only for the malnourished with dietary deficiencies (poor diet). CAUSES  Under-nutrition can result from:  Poor intake.  Malabsorption  Lactation  Bleeding  Diarrhea  Old age.  Kidney failure.  Infancy  Poverty  Infection  Adolescence  Excessive sweating  Drug addiction  Pregnancy  Early childhood. Under-nutrition comes anytime the demand is more than the intake. SYMPTOMS  The problems depend on what type of malnutrition is present. Some general symptoms include:  Fatigue.  Dizziness.  Fainting  Weight loss.  Poor immune response.  Lack of menstruation.  Lack of growth in children.  Hair loss. DIAGNOSIS  Your caregiver will usually suspect malnutrition based on results of your:   Medical and dietary history.  Physical exam. This will often include measurements of your BMI (body mass index).  Perhaps some blood tests. These may include: plasma levels of nutrients and nutrient-dependent substances, such as:  Hemoglobin  Thyroid  hormones  Transferrin  Albumin RISK FACTORS Persons in the following circumstances may be at risk of malnutrition.  Infants and children are at risk of under-nutrition. This is because of their high demand for energy and essential nutrients. Protein-energy malnutrition in children consuming inadequate amounts of protein, calories, and other nutrients is a particularly severe form of under-nutrition that delays growth and development. This includes [place].  Hemorrhagic disease of the newborn is a life-threatening disorder. This is due to a lack of vitamin K, iron, folic acid, vitamin C, copper, zinc, and vitamin A. This may occur in inadequately fed infants and children.  In adolescence, nutritional requirements increase because they are growing. Anorexia nervosa, a form of starvation, may affect adolescents.  Pregnancy and lactation. Requirements for all nutrients are increased during pregnancy and lactation.  Abnormal diets, such as pica (the consumption of nonnutritive substances, such as clay and charcoal), are common in pregnancy.  Anemia due to folic acid deficiency is common in pregnant women. This is especially true for those who have taken oral contraceptives. Folic acid supplements are now recommended for pregnant women. Folic acid prevents neural tube defects (spina bifida) in children.  Breast-fed-only infants may develop vitamin B12 deficiency if the mother is a vegan.  An alcoholic mother may have a handicapped and stunted child with fetal alcohol syndrome. This is due to the effects of alcohol on the fetus. Do not drink during pregnancy.  Old age: A weakened sense of taste and smell, loneliness, physical and mental handicaps, immobility, and chronic illness can hurt the food intake in the elderly. Absorption is reduced. This may add to iron deficiency, calcium and bone problems and also a softening of the bones due to lack of vitamin D. This is also made worse  by not being in the sun.  With aging, we loose lean body mass. These changes and a reduction in physical activity result in lower energy and protein requirements compared with those of younger adults.  Chronic disease including malabsorption states (including those resulting from surgery) tend to impair the absorption of fat-soluble vitamins, vitamin B12, calcium, and iron.  Liver disease impairs the storage of vitamins A and B12. It also interferes with the metabolism of protein and energy sources.  Kidney disease may cause deficiencies of protein, iron, and vitamin D.  Cancer and AIDS may cause anorexia. This is a loss  of appetite.  Vegetarian diet. The most common form of this type of diet is when meat and fish are not eaten, but eggs and dairy products are eaten. Iron deficiency is the only risk. Ovo-lacto vegetarians tend to live longer and to develop fewer chronic disabling conditions than their meat-eating peers. However, their lifestyle usually includes regular exercise and abstention from alcohol and tobacco. This may contribute to better health. Vegans consume no animal products and are susceptible to vitamin B12 deficiency. Yeast extracts and oriental-style fermented foods provide this vitamin. Intake of calcium, iron, and zinc also tends to be low. A fruitarian diet (eat only fruit) is deficient in protein, salt, and many micronutrients. This is not recommended.  Fad diets: Many commercial diets are claimed to enhance well-being or reduce weight. A physician should be alert to early evidence of nutrient deficiency or toxicity in patients on these diets. Such diets have resulted in vitamin, mineral, and protein deficiency states and cardiac, renal, and metabolic disorders. Some fad diets have resulted in death. People on very low calorie diets (less than 400 kcal/day) cannot sustain health for long. Some trace mineral supplements have induced toxicity.  Alcohol or drug dependency:  Addiction leads to a troubled lifestyle in which adequate nourishment is ignored. Absorption and metabolism of nutrients are impaired. High levels of alcohol are poisonous. Too much alcohol can cause tissue injury, particularly of the GI tract, liver, pancreas, brain, and peripheral nervous system. Beer drinkers who consume food may gain weight, but alcoholics who use more than one quart of hard liquor per day lose weight and become undernourished. Drug addicts are usually very skinny. Alcoholism is the most common cause of thiamine deficiency and may lead to deficiencies of magnesium, zinc, and other vitamins. TREATMENT  Get treatment if you experience changes in how your body is working.  PREVENTION  Eating a good, well-balanced diet helps to prevent most forms of malnutrition. Document Released: 04/26/2005 Document Revised: 09/02/2011 Document Reviewed: 05/18/2007 [facility] Patient Information 2015 [place], [place]. This information is not intended to replace advice given to you by your health care provider. Make sure you discuss any questions you have with your health care provider. Potassium Content of Foods Potassium is a mineral found in many foods and drinks. It helps keep fluids and minerals balanced in your body and affects how steadily your heart beats. Potassium also helps control your blood pressure and keep your muscles and nervous system healthy. Certain health conditions and medicines may change the balance of potassium in your body. When this happens, you can help balance your level of potassium through the foods that you do or do not eat. Your health care provider or dietitian may recommend an amount of potassium that you should have each day. The following lists of foods provide the amount of potassium (in parentheses) per serving in each item. HIGH IN POTASSIUM  The following foods and beverages have 200 mg or more of potassium per serving:  Apricots, 2 raw or 5 dry (200  mg).  Artichoke, 1 medium (345 mg).  Avocado, raw,  each (245 mg).  Banana, 1 medium (425 mg).  Beans, lima, or baked beans, canned,  cup (280 mg).  Beans, white, canned,  cup (595 mg).  Beef roast, 3 oz (320 mg).  Beef, ground, 3 oz (270 mg).  Beets, raw or cooked,  cup (260 mg).  Bran muffin, 2 oz (300 mg).  Broccoli,  cup (230 mg).  Brussels sprouts,  cup (250 mg).  Cantaloupe,  cup (215 mg).  Cereal, 100% bran,  cup (200-400 mg).  Cheeseburger, single, fast food, 1 each (225-400 mg).  Chicken, 3 oz (220 mg).  Clams, canned, 3 oz (535 mg).  Crab, 3 oz (225 mg).  Dates, 5 each (270 mg).  Dried beans and peas,  cup (300-475 mg).  Figs, dried, 2 each (260 mg).  Fish: halibut, tuna, cod, snapper, 3 oz (480 mg).  Fish: salmon, haddock, swordfish, perch, 3 oz (300 mg).  Fish, tuna, canned 3 oz (200 mg).  [place] fries, fast food, 3 oz (470 mg).  Granola with fruit and nuts,  cup (200 mg).  Grapefruit juice,  cup (200 mg).  Greens, beet,  cup (655 mg).  Honeydew melon,  cup (200 mg).  Kale, raw, 1 cup (300 mg).  Kiwi, 1 medium (240 mg).  Kohlrabi, rutabaga, parsnips,  cup (280 mg).  Lentils,  cup (365 mg).  Mango, 1 each (325 mg).  Milk, chocolate, 1 cup (420 mg).  Milk: nonfat, low-fat, whole, buttermilk, 1 cup (350-380 mg).  Molasses, 1 Tbsp (295 mg).  Mushrooms,  cup (280) mg.  Nectarine, 1 each (275 mg).  Nuts: almonds, peanuts, hazelnuts, [place], cashew, mixed, 1 oz (200 mg).  Nuts, pistachios, 1 oz (295 mg).  Orange, 1 each (240 mg).  Orange juice,  cup (235 mg).  Papaya, medium,  fruit (390 mg).  Peanut butter, chunky, 2 Tbsp (240 mg).  Peanut butter, smooth, 2 Tbsp (210 mg).  Pear, 1 medium (200 mg).  Pomegranate, 1 whole (400 mg).  Pomegranate juice,  cup (215 mg).  Pork, 3 oz (350 mg).  Potato chips, salted, 1 oz (465 mg).  Potato, baked with skin, 1 medium (925 mg).  Potatoes, boiled,   cup (255 mg).  Potatoes, mashed,  cup (330 mg).  Prune juice,  cup (370 mg).  Prunes, 5 each (305 mg).  Pudding, chocolate,  cup (230 mg).  Pumpkin, canned,  cup (250 mg).  Raisins, seedless,  cup (270 mg).  Seeds, sunflower or pumpkin, 1 oz (240 mg).  Soy milk, 1 cup (300 mg).  Spinach,  cup (420 mg).  Spinach, canned,  cup (370 mg).  Sweet potato, baked with skin, 1 medium (450 mg).  Swiss chard,  cup (480 mg).  Tomato or vegetable juice,  cup (275 mg).  Tomato sauce or puree,  cup (400-550 mg).  Tomato, raw, 1 medium (290 mg).  Tomatoes, canned,  cup (200-300 mg).  [place], 3 oz (250 mg).  Wheat germ, 1 oz (250 mg).  Winter squash,  cup (250 mg).  Yogurt, plain or fruited, 6 oz (260-435 mg).  Zucchini,  cup (220 mg). MODERATE IN POTASSIUM The following foods and beverages have 50-200 mg of potassium per serving:  Apple, 1 each (150 mg).  Apple juice,  cup (150 mg).  Applesauce,  cup (90 mg).  Apricot nectar,  cup (140 mg).  Asparagus, small spears,  cup or 6 spears (155 mg).  Bagel, cinnamon raisin, 1 each (130 mg).  Bagel, egg or plain, 4 in., 1 each (70 mg).  Beans, green,  cup (90 mg).  Beans, yellow,  cup (190 mg).  Beer, regular, 12 oz (100 mg).  Beets, canned,  cup (125 mg).  Blackberries,  cup (115 mg).  Blueberries,  cup (60 mg).  Bread, whole wheat, 1 slice (70 mg).  Broccoli, raw,  cup (145 mg).  Cabbage,  cup (150 mg).  Carrots, cooked or raw,  cup (180 mg).  Cauliflower, raw,  cup (150 mg).  Celery, raw,  cup (155 mg).  Cereal, bran flakes, cup (120-150 mg).  Cheese, cottage,  cup (110 mg).  Cherries, 10 each (150 mg).  Chocolate, 1 oz bar (165 mg).  Coffee, brewed 6 oz (90 mg).  Corn,  cup or 1 ear (195 mg).  Cucumbers,  cup (80 mg).  Egg, large, 1 each (60 mg).  Eggplant,  cup (60 mg).  Endive, raw, cup (80 mg).  English muffin, 1 each (65 mg).  Fish, orange  roughy, 3 oz (150 mg).  Frankfurter, beef or pork, 1 each (75 mg).  Fruit cocktail,  cup (115 mg).  Grape juice,  cup (170 mg).  Grapefruit,  fruit (175 mg).  Grapes,  cup (155 mg).  Greens: kale, turnip, collard,  cup (110-150 mg).  Ice cream or frozen yogurt, chocolate,  cup (175 mg).  Ice cream or frozen yogurt, vanilla,  cup (120-150 mg).  Lemons, limes, 1 each (80 mg).  Lettuce, all types, 1 cup (100 mg).  Mixed vegetables,  cup (150 mg).  Mushrooms, raw,  cup (110 mg).  Nuts: walnuts, pecans, or macadamia, 1 oz (125 mg).  Oatmeal,  cup (80 mg).  Okra,  cup (110 mg).  Onions, raw,  cup (120 mg).  Peach, 1 each (185 mg).  Peaches, canned,  cup (120 mg).  Pears, canned,  cup (120 mg).  Peas, green, frozen,  cup (90 mg).  Peppers, green,  cup (130 mg).  Peppers, red,  cup (160 mg).  Pineapple juice,  cup (165 mg).  Pineapple, fresh or canned,  cup (100 mg).  Plums, 1 each (105 mg).  Pudding, vanilla,  cup (150 mg).  Raspberries,  cup (90 mg).  Rhubarb,  cup (115 mg).  Rice, wild,  cup (80 mg).  Shrimp, 3 oz (155 mg).  Spinach, raw, 1 cup (170 mg).  Strawberries,  cup (125 mg).  Summer squash  cup (175-200 mg).  Swiss chard, raw, 1 cup (135 mg).  Tangerines, 1 each (140 mg).  Tea, brewed, 6 oz (65 mg).  Turnips,  cup (140 mg).  Watermelon,  cup (85 mg).  Wine, red, table, 5 oz (180 mg).  Wine, white, table, 5 oz (100 mg). LOW IN POTASSIUM The following foods and beverages have less than 50 mg of potassium per serving.  Bread, white, 1 slice (30 mg).  Carbonated beverages, 12 oz (less than 5 mg).  Cheese, 1 oz (20-30 mg).  Cranberries,  cup (45 mg).  Cranberry juice cocktail,  cup (20 mg).  Fats and oils, 1 Tbsp (less than 5 mg).  Hummus, 1 Tbsp (32 mg).  Nectar: papaya, mango, or pear,  cup (35 mg).  Rice, white or brown,  cup (50 mg).  Spaghetti or macaroni,  cup cooked (30  mg).  Tortilla, flour or corn, 1 each (50 mg).  Waffle, 4 in., 1 each (50 mg).  Water chestnuts,  cup (40 mg). Document Released: 01/22/2005 Document Revised: 06/15/2013 Document Reviewed: 05/07/2013 [facility] Patient Information 2015 [place], [place]. This information is not intended to replace advice given to you by your health care provider. Make sure you discuss any questions you have with your health care provider.

## 2015-03-30 ENCOUNTER — Ambulatory Visit (INDEPENDENT_AMBULATORY_CARE_PROVIDER_SITE_OTHER): Payer: Medicare Other | Admitting: Family Medicine

## 2015-03-30 ENCOUNTER — Telehealth: Payer: Self-pay | Admitting: *Deleted

## 2015-03-30 ENCOUNTER — Telehealth: Payer: Self-pay

## 2015-03-30 ENCOUNTER — Encounter: Payer: Self-pay | Admitting: Family Medicine

## 2015-03-30 VITALS — BP 120/78 | HR 118 | Temp 99.2°F | Resp 16 | Wt 99.0 lb

## 2015-03-30 DIAGNOSIS — I70232 Atherosclerosis of native arteries of right leg with ulceration of calf: Secondary | ICD-10-CM | POA: Diagnosis not present

## 2015-03-30 DIAGNOSIS — I739 Peripheral vascular disease, unspecified: Secondary | ICD-10-CM

## 2015-03-30 DIAGNOSIS — Z1231 Encounter for screening mammogram for malignant neoplasm of breast: Secondary | ICD-10-CM

## 2015-03-30 DIAGNOSIS — I70219 Atherosclerosis of native arteries of extremities with intermittent claudication, unspecified extremity: Secondary | ICD-10-CM

## 2015-03-30 MED ORDER — ONDANSETRON 4 MG PO TBDP: 4.0000 mg | ORAL_TABLET | Freq: Three times a day (TID) | ORAL | Status: DC | PRN

## 2015-03-30 MED ORDER — POTASSIUM CHLORIDE CRYS ER 20 MEQ PO TBCR: 20.0000 meq | EXTENDED_RELEASE_TABLET | Freq: Every day | ORAL | Status: DC

## 2015-03-30 MED ORDER — FUROSEMIDE 20 MG PO TABS: 20.0000 mg | ORAL_TABLET | Freq: Two times a day (BID) | ORAL | Status: DC

## 2015-03-30 NOTE — Telephone Encounter (Signed)
Rx's sent to [facility].

## 2015-03-30 NOTE — Telephone Encounter (Signed)
Called to remind pt of appt today at 1045

## 2015-03-30 NOTE — Telephone Encounter (Signed)
Patient was seen today and is calling because she didn't receive the at the pharmacy and wants to make sure we sent it to the right one. [facility] on [address]

## 2015-03-31 ENCOUNTER — Ambulatory Visit (INDEPENDENT_AMBULATORY_CARE_PROVIDER_SITE_OTHER): Payer: Medicare Other | Admitting: Family Medicine

## 2015-03-31 ENCOUNTER — Telehealth: Payer: Self-pay

## 2015-03-31 VITALS — BP 110/70 | HR 91 | Temp 97.6°F | Resp 16 | Ht 63.0 in | Wt 100.4 lb

## 2015-03-31 DIAGNOSIS — Z720 Tobacco use: Secondary | ICD-10-CM | POA: Diagnosis not present

## 2015-03-31 DIAGNOSIS — R600 Localized edema: Secondary | ICD-10-CM | POA: Diagnosis not present

## 2015-03-31 DIAGNOSIS — J438 Other emphysema: Secondary | ICD-10-CM

## 2015-03-31 DIAGNOSIS — I70209 Unspecified atherosclerosis of native arteries of extremities, unspecified extremity: Secondary | ICD-10-CM

## 2015-03-31 NOTE — Patient Instructions (Signed)
Keep the Ace wraps on your legs for the next couple of weeks, taking them off every 2 or 3 days when you shower.  Consider getting some good compression stockings to use when you stop wrapping with the Ace or if you find the swelling is not bad.  With a combination of emphysema and circulation problems it is mandatory that you quit smoking. Your the one that needs to make up your mind to do that, no one else can do that for you.  Cut you fingernails short so you cannot scratch and take it yourself  See Dr. Brigitte Pulse back sometime next week

## 2015-03-31 NOTE — Telephone Encounter (Signed)
Pt was seen here today and received a call from [place]. Please give a CB [phone]

## 2015-03-31 NOTE — Progress Notes (Signed)
Patient ID: Rory Percy, female    DOB: 01-14-1951  Age: 64 y.o. MRN: [id number]  Chief Complaint  Patient presents with  . swelling in legs    both/ x 2 days. Pt was here yesterday and had her legs wrapped, she wants them done again.    Subjective:   Patient has been here a couple times this week. She saw Dr. Brigitte Pulse, then she came back in to get the Ace wrap put on her legs again yesterday. There is no noted on that visit. She came back in today, apparently just get the legs wrapped bilaterally seen her before I found that out. She has a history of emphysema and peripheral vascular disease. She is disabled and spends most her time lying in bed at home. Smokes heavily. She's been smoking since she was in seventh grade.   Current allergies, medications, problem list, past/family and social histories reviewed.  Objective:  BP 110/70 mmHg  Pulse 91  Temp(Src) 97.6 F (36.4 C) (Oral)  Resp 16  Ht 5\' 3"  (1.6 m)  Wt 100 lb 6.4 oz (45.541 kg)  BMI 17.79 kg/m2  SpO2 90% Both legs are slightly cool, mildly edematous, have excoriations on them. The biggest places behind the right calf about 1 x 4 cm. Her nails are not extremely long but there long enough to scratch at her. She smells strongly of cigarette smoke. 1-2+ edema of the legs this morning. It is not swollen up badly today yet.  Assessment & Plan:   Assessment: No diagnosis found.    Plan: Discussed compression stockings Stressed the absolute need for stopping smoking. We discussed that for about 5 minutes. She needs to make up her mind that she is going to smoke. I told her friend who was with her not to be nagging on this, that the patient herself is on her needs to decide on this but if she keeps smoking she will continue to destroy her life.  Will repeat wrap the legs today, with 4-H aces. She should leave on for 2 or 3 days at a time, taking sponge baths in between. She can return next week to see Dr. Brigitte Pulse back.   Patient  Instructions  Keep the Ace wraps on your legs for the next couple of weeks, taking them off every 2 or 3 days when you shower.  Consider getting some good compression stockings to use when you stop wrapping with the Ace or if you find the swelling is not bad.  With a combination of emphysema and circulation problems it is mandatory that you quit smoking. Your the one that needs to make up your mind to do that, no one else can do that for you.  Cut you fingernails short so you cannot scratch and take it yourself  See Dr. Brigitte Pulse back sometime next week     No Follow-up on file.   Aldair Rickel, MD 03/31/2015

## 2015-04-03 ENCOUNTER — Ambulatory Visit (INDEPENDENT_AMBULATORY_CARE_PROVIDER_SITE_OTHER): Payer: Medicare Other | Admitting: Family Medicine

## 2015-04-03 VITALS — BP 106/60 | HR 80 | Temp 99.1°F | Resp 16 | Ht 63.0 in | Wt 102.0 lb

## 2015-04-03 DIAGNOSIS — S80811S Abrasion, right lower leg, sequela: Secondary | ICD-10-CM

## 2015-04-03 DIAGNOSIS — L03115 Cellulitis of right lower limb: Secondary | ICD-10-CM

## 2015-04-03 DIAGNOSIS — R600 Localized edema: Secondary | ICD-10-CM | POA: Diagnosis not present

## 2015-04-03 MED ORDER — CEPHALEXIN 500 MG PO CAPS: 500.0000 mg | ORAL_CAPSULE | Freq: Two times a day (BID) | ORAL | Status: DC

## 2015-04-03 NOTE — Progress Notes (Signed)
[facility] [address], [place] [address] [phone]  Date:  04/03/2015   Name:  [name]   DOB:  Jun 15, 1951   MRN:  [id number]  PCP:  [name], MD    Chief Complaint: swelling in legs   History of Present Illness:  [name] is a 64 y.o. very pleasant female patient who presents with the following:  She was last here on 10/7- she had had her legs wrapped a few times with an Ace wrap over the last week due to swelling She has a history of depression, PVD, heavy long- term tobacco abuse, past history of gastric bypass with subsequent malnutrition.   She was given a 2 week supply of her hydrocodone by my partner Dr. Brigitte Pulse on 10/2.   She will see Dr. Brigitte Pulse next week She has been using a post- op shoe on her feet due to swelling, but one was eaten by her dog so she needs a new one  She is concerned about an area on her right calf where the skin has come off- may be developing a small ulcer.    Patient Active Problem List   Diagnosis Date Noted  . Secondary hyperparathyroidism ([place]) 01/10/2015  . Vitamin D deficiency 01/10/2015  . 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 Anemia, iron deficiency 11/03/2014  . Chronic pain syndrome 10/24/2014  . Osteoporosis 10/05/2014  . Vertebral compression fracture ([place]) 10/05/2014  . Coronary artery disease due to calcified coronary lesion 10/05/2014  . Weight loss   . Tobacco abuse   . History of Roux-en-Y gastric bypass   . Orthostatic hypotension 08/11/2014  . S/P jejunostomy Feb 2016 07/26/2014  . Severe protein-calorie malnutrition ([place]) 12/30/2013  . Constipation, chronic 07/10/2013  . Chronic abdominal pain   . COPD (chronic obstructive pulmonary disease) ([place]) 11/07/2010  . Major depressive disorder, recurrent episode, moderate ([place]) 06/06/2010  . 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 Anxiety state 01/07/2007  . BARRETT'S ESOPHAGUS, HX OF 07/03/2005    Past Medical History  Diagnosis  Date  . Peptic ulcer disease     dr Oletta Lamas  . Acute duodenal ulcer with hemorrhage and perforation, with obstruction ([place])   . Barrett's esophagus   . Menopause   . GERD (gastroesophageal reflux disease)   . Asthma   . Chronic abdominal pain     narcotic dependence, dr gyarteng-dak at [facility]  . Narcotic abuse   . Anxiety     sees [name] NP at Dr. Radonna Ricker office  . Fx two ribs-open 08-24-11    left 4th and 5th  . Fx of fibula 07-28-11    left fibula, 2 places   . COPD (chronic obstructive pulmonary disease) ([place])   . Allergy   . Anemia   . History of blood transfusion     "related to OR; maybe when I perforated that ulcer"  . Lap Nissen + truncal vagotomy July 2008 05/13/2013  . LEG EDEMA 01/19/2008    Qualifier: Diagnosis of  By: [name] MD, [name]   . ACUTE DUODEN ULCER W/HEMORR PERF&OBSTRUCTION 06/26/2004    Qualifier: Diagnosis of  By: [name] MD, [name]   . FEVER, RECURRENT 08/11/2009    Qualifier: Diagnosis of  By: [name] MD, [name] MENOPAUSE 01/07/2007    Qualifier: Diagnosis of  By: [name], CMA, [place]    . Gastroparesis 06/03/2007    Qualifier: Diagnosis of  By: [name] MD, [name]   .  GASTRIC OUTLET OBSTRUCTION 08/28/2007    In July 2008 she underwent laparoscopic enterolysis, Nissen fundoplication over a #01 bougie, single pledgeted suture colosure of the hiatus and a 3 suture wrap.  Lap truncal vagotomy and a loop gastrojejunostomy was performed.  This was revised in August of 2009 to a roux en Y gastrojejujostomy.     . 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 Depression   . History of hiatal hernia   . Complication of anesthesia     pt states had too much xanax on board - agitated   . Anginal pain ([place])     "many many years ago"  . Exertional shortness of breath   . Pneumonia, organism unspecified 11/07/2010    Assoc with R Parapneumonic effusion 09/2010    - Tapped 10/05/10    - CxR resolved 11/07/2010    . Headache      "monthly" (09/06/2014)  . Arthritis     "hips" (09/06/2014)  . Myocardial infarction [facility])     Past Surgical History  Procedure Laterality Date  . Esophagogastroduodenoscopy  12-29-09    dr qadeer at [facility], several gastric ulcers  . Laparoscopic nissen fundoplication    . 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 Repair of perforated ulcer    . Biopsy thyroid  08-13-11    benign nodule, per Dr. Melida Quitter   . Esophagogastroduodenoscopy N/A 09/25/2012    Procedure: ESOPHAGOGASTRODUODENOSCOPY (EGD);  Surgeon: Beryle Beams, MD;  Location: [facility];  Service: Endoscopy;  Laterality: N/A;  . Fracture surgery    . Tonsillectomy    . Cataract extraction w/ intraocular lens  implant, bilateral Bilateral   . Laparoscopic partial gastrectomy N/A 06/29/2013    Procedure: Gastrectomy and GJ Tube placement;  Surgeon: Pedro Earls, MD;  Location: [facility];  Service: General;  Laterality: N/A;  . Colonoscopy    . Gastrostomy N/A 07/26/2014    Procedure: LAPRASCOPIC ASSISTED OPEN PLACEMENT OF JEJUNOSTOMY TUBE;  Surgeon: Pedro Earls, MD;  Location: [facility];  Service: General;  Laterality: N/A;  . Orif hip fracture Bilateral     "pins in both of them"  . Dilation and curettage of uterus    . Hernia repair      "hiatal"  . Joint replacement      Social History  Substance Use Topics  . Smoking status: Current Every Day Smoker -- 2.00 packs/day for 50 years    Types: Cigarettes  . Smokeless tobacco: Never Used  . Alcohol Use: No    Family History  Problem Relation Age of Onset  . Cancer Mother     kidney, magliant tumor on face  . Stroke Mother   . Celiac disease Father   . Cancer Father     kidney and pancreatic    Allergies  Allergen Reactions  . Lithium Nausea And Vomiting  . Celebrex [Celecoxib] Nausea Only  . Morphine Itching    REACTION: itching  . Quetiapine     REACTION: tardive dyskinesia  . Varenicline Tartrate     REACTION: unspecified    Medication  list has been reviewed and updated.  Current Outpatient Prescriptions on File Prior to Visit  Medication Sig Dispense Refill  . ALPRAZolam (XANAX) 1 MG tablet Take 1 tablet (1 mg total) by mouth 3 (three) times daily as needed for anxiety. 42 tablet 0  . aspirin EC 81 MG tablet Take 81 mg by mouth daily.    Marland Kitchen  diazepam (VALIUM) 5 MG tablet Take 1 tablet (5 mg total) by mouth every 12 (twelve) hours as needed for anxiety. 30 tablet 0  . ergocalciferol (VITAMIN D2) 50000 UNITS capsule Take 1 capsule (50,000 Units total) by mouth once a week. 24 capsule 0  . feeding supplement (BOOST / RESOURCE BREEZE) LIQD Take 1 Container by mouth 3 (three) times daily between meals. 90 Container 11  . furosemide (LASIX) 20 MG tablet Take 1 tablet (20 mg total) by mouth 2 (two) times daily. Take 1 with breakfast and 1 with lunch as needed for leg swelling 60 tablet 0  . HYDROcodone-acetaminophen (NORCO) 10-325 MG tablet Take 1-2 tablets by mouth every 6 (six) hours as needed for moderate pain. This is a 2 week supply 120 tablet 0  . mirtazapine (REMERON) 45 MG tablet Take 1 tablet (45 mg total) by mouth at bedtime. 30 tablet 1  . Multiple Vitamin (MULTIVITAMIN WITH MINERALS) TABS tablet Take 1 tablet by mouth daily.    . Multiple Vitamins-Minerals (OCUVITE PO) Take 1 tablet by mouth daily.    Marland Kitchen OLANZapine (ZYPREXA) 7.5 MG tablet Take 1 tablet (7.5 mg total) by mouth at bedtime. 30 tablet 1  . ondansetron (ZOFRAN ODT) 4 MG disintegrating tablet Take 1-2 tablets (4-8 mg total) by mouth every 8 (eight) hours as needed for nausea or vomiting. 180 tablet 0  . pantoprazole (PROTONIX) 40 MG tablet Take 1 tablet (40 mg total) by mouth 2 (two) times daily before a meal. 180 tablet 3  . potassium chloride SA (K-DUR,KLOR-CON) 20 MEQ tablet Take 1 tablet (20 mEq total) by mouth daily. 30 tablet 0   No current facility-administered medications on file prior to visit.    Review of Systems:  As per HPI- otherwise  negative.   Physical Examination: Filed Vitals:   04/03/15 0937  BP: 106/60  Pulse: 80  Temp: 99.1 F (37.3 C)  Resp: 16   Filed Vitals:   04/03/15 0937  Height: 5\' 3"  (1.6 m)  Weight: 102 lb (46.267 kg)   Body mass index is 18.07 kg/(m^2). Ideal Body Weight: Weight in (lb) to have BMI = 25: 140.8  GEN: WDWN, NAD, Non-toxic, A & O x 3, very thin, appears older than age per her usual appearance HEENT: Atraumatic, Normocephalic. Neck supple. No masses, No LAD. Ears and Nose: No external deformity. CV: RRR, No M/G/R. No JVD. No thrill. No extra heart sounds. PULM: CTA B, no wheezes, crackles, rhonchi. No retractions. No resp. distress. No accessory muscle use. ABD: S, NT, ND, +BS. No rebound. No HSM. EXTR: No c/c.  She has ace wraps on both of her LE Removed the right- there is a 1cm x 2cm area of concern on the right mid/ posterior calf. It may be an area that was abraded or excoriated- it is just through the top layer of epidermis.  It seems to be forming a soft eschar.  There is no redness or drainage  Dressed with silvadene and re-wrapped NEURO Normal gait.  PSYCH: Normally interactive. Conversant. Not depressed or anxious appearing.  Calm demeanor.   Assessment and Plan: Abrasion, right lower leg, sequela - Plan: cephALEXin (KEFLEX) 500 MG capsule  Bilateral edema of lower extremity  Start on keflex to help prevent infection in this debilitated pt Gave her some silvadene to take home and discussed   Signed Lamar Blinks, MD

## 2015-04-03 NOTE — Telephone Encounter (Signed)
Pt is being seen here in the clinic now.

## 2015-04-03 NOTE — Patient Instructions (Addendum)
Use the keflex (antibiotic) twice a day for one week for the area on your right calf Apply the silvadene ointment once or twice a day as needed Keep a non- stick pad over the wound until healed Continue to wrap your leg as needed for swelling It is ok to take a shower and ok to walk Please see Dr. Brigitte Pulse next week as planned- sooner if you have any other concerns

## 2015-04-05 ENCOUNTER — Telehealth: Payer: Self-pay

## 2015-04-05 ENCOUNTER — Ambulatory Visit (INDEPENDENT_AMBULATORY_CARE_PROVIDER_SITE_OTHER): Payer: Medicare Other | Admitting: Family Medicine

## 2015-04-05 VITALS — BP 84/58 | HR 100 | Temp 98.7°F | Resp 18 | Ht 63.0 in | Wt 104.0 lb

## 2015-04-05 DIAGNOSIS — Z720 Tobacco use: Secondary | ICD-10-CM

## 2015-04-05 DIAGNOSIS — I739 Peripheral vascular disease, unspecified: Secondary | ICD-10-CM | POA: Diagnosis not present

## 2015-04-05 DIAGNOSIS — J449 Chronic obstructive pulmonary disease, unspecified: Secondary | ICD-10-CM | POA: Diagnosis not present

## 2015-04-05 DIAGNOSIS — F331 Major depressive disorder, recurrent, moderate: Secondary | ICD-10-CM

## 2015-04-05 DIAGNOSIS — R109 Unspecified abdominal pain: Secondary | ICD-10-CM

## 2015-04-05 DIAGNOSIS — D509 Iron deficiency anemia, unspecified: Secondary | ICD-10-CM

## 2015-04-05 DIAGNOSIS — L97909 Non-pressure chronic ulcer of unspecified part of unspecified lower leg with unspecified severity: Secondary | ICD-10-CM

## 2015-04-05 DIAGNOSIS — G894 Chronic pain syndrome: Secondary | ICD-10-CM | POA: Diagnosis not present

## 2015-04-05 DIAGNOSIS — I872 Venous insufficiency (chronic) (peripheral): Secondary | ICD-10-CM

## 2015-04-05 DIAGNOSIS — E43 Unspecified severe protein-calorie malnutrition: Secondary | ICD-10-CM | POA: Diagnosis not present

## 2015-04-05 DIAGNOSIS — G8929 Other chronic pain: Secondary | ICD-10-CM

## 2015-04-05 DIAGNOSIS — I951 Orthostatic hypotension: Secondary | ICD-10-CM | POA: Diagnosis not present

## 2015-04-05 LAB — POCT CBC
Granulocyte percent: 57.9 %G (ref 37–80)
HCT, POC: 31.8 % — AB (ref 37.7–47.9)
HEMOGLOBIN: 10.3 g/dL — AB (ref 12.2–16.2)
Lymph, poc: 2 (ref 0.6–3.4)
MCH: 30.7 pg (ref 27–31.2)
MCHC: 32.3 g/dL (ref 31.8–35.4)
MCV: 94.9 fL (ref 80–97)
MID (CBC): 0.7 (ref 0–0.9)
MPV: 7.8 fL (ref 0–99.8)
PLATELET COUNT, POC: 302 10*3/uL (ref 142–424)
POC Granulocyte: 3.6 (ref 2–6.9)
POC LYMPH PERCENT: 31.7 %L (ref 10–50)
POC MID %: 10.4 %M (ref 0–12)
RBC: 3.35 M/uL — AB (ref 4.04–5.48)
RDW, POC: 18.5 %
WBC: 6.3 10*3/uL (ref 4.6–10.2)

## 2015-04-05 LAB — COMPREHENSIVE METABOLIC PANEL
ALBUMIN: 2.3 g/dL — AB (ref 3.6–5.1)
ALT: 15 U/L (ref 6–29)
AST: 18 U/L (ref 10–35)
Alkaline Phosphatase: 93 U/L (ref 33–130)
BUN: 15 mg/dL (ref 7–25)
CALCIUM: 7.5 mg/dL — AB (ref 8.6–10.4)
CHLORIDE: 106 mmol/L (ref 98–110)
CO2: 30 mmol/L (ref 20–31)
Creat: 0.9 mg/dL (ref 0.50–0.99)
GLUCOSE: 75 mg/dL (ref 65–99)
POTASSIUM: 4.3 mmol/L (ref 3.5–5.3)
Sodium: 143 mmol/L (ref 135–146)
Total Bilirubin: 0.3 mg/dL (ref 0.2–1.2)
Total Protein: 4.3 g/dL — ABNORMAL LOW (ref 6.1–8.1)

## 2015-04-05 MED ORDER — HYDROCODONE-ACETAMINOPHEN 10-325 MG PO TABS: 1.0000 | ORAL_TABLET | Freq: Four times a day (QID) | ORAL | Status: DC | PRN

## 2015-04-05 MED ORDER — FENTANYL 12 MCG/HR TD PT72: 12.5000 ug | MEDICATED_PATCH | TRANSDERMAL | Status: DC

## 2015-04-05 MED ORDER — BOOST / RESOURCE BREEZE PO LIQD: 1.0000 | Freq: Three times a day (TID) | ORAL | Status: DC

## 2015-04-05 MED ORDER — MIRTAZAPINE 15 MG PO TABS: 15.0000 mg | ORAL_TABLET | Freq: Every day | ORAL | Status: DC

## 2015-04-05 MED ORDER — MIRTAZAPINE 30 MG PO TABS: 30.0000 mg | ORAL_TABLET | Freq: Every day | ORAL | Status: DC

## 2015-04-05 NOTE — Progress Notes (Signed)
Subjective:  This chart was scribed for [name], MD by [name], Medical Scribe. This patient was seen in Room 6 and the patient's care was started 9:46 AM.    Patient ID: [name], female    DOB: [birth year], 64 y.o.   MRN: [id number] Chief Complaint  Patient presents with  . Follow-up  . Nausea  . Edema  . Anemia    HPI [name] is a 64 y.o. female who presents to [facility] for follow up.  Saw pt in office 6 days prior, she was having a lot of lower extremity edema at that time as well as claudication, and developed a a superficial ulceration on her mid posterior calf, approx 2x1inches diameter. I placed her in unna boots bilaterally and had her restart lasix 20mg . Put in for arterial ultrasound and referred to vascular. She returned to clinic the following day where dr hopper was kind enough to spend extensive time to discuss smoking cessation. The unna boots were removed and ace wraps replaced. She was advised to consider compression stockings; advised ace wraps on for 2-3 days. When she was in clinic on thurs, after I put unna boots and ace wraps on her, she can no longer fit into her shoes so she was given post op shoes which we're hoping to help with her osteoarthritis and foot pain. She therefore returned to clinic 2 days prior for post op shoes as 1 was eaten by her dog. Small ulceration on her right lower ext doing well, silvadene was applied, ace wraps reapplied keflex was given, advised silvadene application twice a day. Pt's weight has gradually increased over pass week.    She also notes nausea with some dry mouth. She didn't sleep well last night. Her lower extremities continue to hurt and throb. She took her dressing off when taking a shower 6 days prior, instead of leaving them on for 3 days as advised. She has not gotten her records from [facility] pain [facility] yet.   Her pain medication was increased in dosage, and she increased the amount she should take. She's used  her pain medication to hopefully relieve her lower extremity pain. She has decreased memory recently. Pt denies any SI or plan, she has some occasional rare passive suicidal thoughts but states this was never an issue until office visit today. She used this more as a threat rather than true intentions.   she is requesting letter by me, for personal in home care and services to hep with her ADL's and not needed assisted living that her mother would likely benefit with her present.   Past Medical History  Diagnosis Date  . Peptic ulcer disease     dr Oletta Lamas  . Acute duodenal ulcer with hemorrhage and perforation, with obstruction ([place])   . Barrett's esophagus   . Menopause   . GERD (gastroesophageal reflux disease)   . Asthma   . Chronic abdominal pain     narcotic dependence, dr gyarteng-dak at [facility]  . Narcotic abuse   . Anxiety     sees [name] NP at Dr. Radonna Ricker office  . Fx two ribs-open 08-24-11    left 4th and 5th  . Fx of fibula 07-28-11    left fibula, 2 places   . COPD (chronic obstructive pulmonary disease) ([place])   . Allergy   . Anemia   . History of blood transfusion     "related to OR; maybe when I perforated that  ulcer"  . Lap Nissen + truncal vagotomy July 2008 05/13/2013  . LEG EDEMA 01/19/2008    Qualifier: Diagnosis of  By: [name] MD, [name]   . ACUTE DUODEN ULCER W/HEMORR PERF&OBSTRUCTION 06/26/2004    Qualifier: Diagnosis of  By: [name] MD, [name]   . FEVER, RECURRENT 08/11/2009    Qualifier: Diagnosis of  By: [name] MD, [name] MENOPAUSE 01/07/2007    Qualifier: Diagnosis of  By: [name], CMA, [place]    . Gastroparesis 06/03/2007    Qualifier: Diagnosis of  By: [name] MD, [name]   . GASTRIC OUTLET OBSTRUCTION 08/28/2007    In July 2008 she underwent laparoscopic enterolysis, Nissen fundoplication over a #24 bougie, single pledgeted suture colosure of the hiatus and a 3 suture wrap.  Lap truncal vagotomy and a loop gastrojejunostomy was performed.   This was revised in August of 2009 to a roux en Y gastrojejujostomy.     . 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 Depression   . History of hiatal hernia   . Complication of anesthesia     pt states had too much xanax on board - agitated   . Anginal pain ([place])     "many many years ago"  . Exertional shortness of breath   . Pneumonia, organism unspecified 11/07/2010    Assoc with R Parapneumonic effusion 09/2010    - Tapped 10/05/10    - CxR resolved 11/07/2010    . Headache     "monthly" (09/06/2014)  . Arthritis     "hips" (09/06/2014)  . Myocardial infarction [facility])    Prior to Admission medications   Medication Sig Start Date End Date Taking? Authorizing Provider  ALPRAZolam [name]) 1 MG tablet Take 1 tablet (1 mg total) by mouth 3 (three) times daily as needed for anxiety. 03/26/15   Shawnee Knapp, MD  aspirin EC 81 MG tablet Take 81 mg by mouth daily.    Historical Provider, MD  cephALEXin (KEFLEX) 500 MG capsule Take 1 capsule (500 mg total) by mouth 2 (two) times daily. 04/03/15   Gay Filler Copland, MD  diazepam (VALIUM) 5 MG tablet Take 1 tablet (5 mg total) by mouth every 12 (twelve) hours as needed for anxiety. 03/14/15   Shawnee Knapp, MD  ergocalciferol (VITAMIN D2) 50000 UNITS capsule Take 1 capsule (50,000 Units total) by mouth once a week. 03/05/15   Shawnee Knapp, MD  feeding supplement (BOOST / RESOURCE BREEZE) LIQD Take 1 Container by mouth 3 (three) times daily between meals. 03/26/15   Shawnee Knapp, MD  furosemide (LASIX) 20 MG tablet Take 1 tablet (20 mg total) by mouth 2 (two) times daily. Take 1 with breakfast and 1 with lunch as needed for leg swelling 03/30/15   Shawnee Knapp, MD  HYDROcodone-acetaminophen [facility]) 10-325 MG tablet Take 1-2 tablets by mouth every 6 (six) hours as needed for moderate pain. This is a 2 week supply 03/26/15   Shawnee Knapp, MD  mirtazapine (REMERON) 45 MG tablet Take 1 tablet (45 mg total) by mouth at bedtime. 03/05/15   Shawnee Knapp, MD    Multiple Vitamin (MULTIVITAMIN WITH MINERALS) TABS tablet Take 1 tablet by mouth daily.    Historical Provider, MD  Multiple Vitamins-Minerals (OCUVITE PO) Take 1 tablet by mouth daily.    Historical Provider, MD  OLANZapine (ZYPREXA) 7.5 MG tablet Take 1 tablet (7.5 mg total) by mouth at bedtime. 03/05/15  Shawnee Knapp, MD  ondansetron (ZOFRAN ODT) 4 MG disintegrating tablet Take 1-2 tablets (4-8 mg total) by mouth every 8 (eight) hours as needed for nausea or vomiting. 03/30/15   Shawnee Knapp, MD  pantoprazole (PROTONIX) 40 MG tablet Take 1 tablet (40 mg total) by mouth 2 (two) times daily before a meal. 03/05/15   Shawnee Knapp, MD  potassium chloride SA (K-DUR,KLOR-CON) 20 MEQ tablet Take 1 tablet (20 mEq total) by mouth daily. 03/30/15   Shawnee Knapp, MD   Allergies  Allergen Reactions  . Lithium Nausea And Vomiting  . Celebrex [Celecoxib] Nausea Only  . Morphine Itching    REACTION: itching  . Quetiapine     REACTION: tardive dyskinesia  . Varenicline Tartrate     REACTION: unspecified     Review of Systems  Constitutional: Positive for fatigue. Negative for fever and chills.  Cardiovascular: Positive for leg swelling (calves bilaterally).  Gastrointestinal: Positive for nausea. Negative for vomiting.  Musculoskeletal: Positive for gait problem. Negative for joint swelling.  Skin: Negative for rash and wound.  Psychiatric/Behavioral: Positive for sleep disturbance and dysphoric mood. Negative for suicidal ideas and self-injury.       Objective:   Physical Exam  Constitutional: She is oriented to person, place, and time. She appears well-developed and well-nourished. No distress.  HENT:  Head: Normocephalic and atraumatic.  Eyes: EOM are normal. Pupils are equal, round, and reactive to light.  Neck: Neck supple.  Cardiovascular: Normal rate.   Pulmonary/Chest: Effort normal. No respiratory distress.  Musculoskeletal: Normal range of motion.  Neurological: She is alert and oriented  to person, place, and time.  Skin: Skin is warm and dry.  Psychiatric: She has a normal mood and affect. Her behavior is normal.  Nursing note and vitals reviewed.   BP 84/58 mmHg  Pulse 100  Temp(Src) 98.7 F (37.1 C) (Oral)  Resp 18  Ht 5\' 3"  (1.6 m)  Wt 104 lb (47.174 kg)  BMI 18.43 kg/m2  SpO2 94%    placed bilateral unna boots, recheck in 3d - DO NOT TAKE OFF AT HOME (as she did on the same day they were initially placed last wk Assessment & Plan:  pt was offered iv fluid, she declined. last script of hydrocodone was 10/2. Instructions to pharmacist not refill until 10/5 of 120 tabs, which she has 4 left. she potentially used this 2 week supply in 1 wk so at 8 tabs a day. Pt with severely worsening mood sxs so needs to make appt w/ psych - would like to try pt on Cymbalta but will hold off so we can defer this to psych.  Restart breeze prot supps Sign record release from [facility] - will place referral to pain clinic as soon as pt can have her past records faxed to the new clinic prior to her acceptance. Cont keflex for superficial ulcer  1. Orthostatic hypotension   2. Chronic obstructive pulmonary disease, unspecified COPD type ([place])   3. Major depressive disorder, recurrent episode, moderate ([place])   4. Chronic abdominal pain   5. Severe protein-calorie malnutrition ([place])   6. Tobacco abuse   7. Chronic pain syndrome   8. Anemia, iron deficiency   9. Peripheral vascular disease ([place])   10. Venous stasis ulcer of leg without varicose veins ([place])     Orders Placed This Encounter  Procedures  . Comprehensive metabolic panel  . POCT CBC    Meds ordered this encounter  Medications  . DISCONTD: mirtazapine (REMERON) 30 MG tablet    Sig: Take 1 tablet (30 mg total) by mouth at bedtime.  . feeding supplement (BOOST / RESOURCE BREEZE) LIQD    Sig: Take 1 Container by mouth 3 (three) times daily between meals.    Dispense:  90 Container    Refill:  [place] to  substitute a similar product if cost for pt is better and change is acceptable to pt  . DISCONTD: fentaNYL (DURAGESIC - DOSED MCG/HR) 12 MCG/HR    Sig: Place 1 patch (12.5 mcg total) onto the skin every 3 (three) days.    Dispense:  1 patch    Refill:  0  . DISCONTD: HYDROcodone-acetaminophen (NORCO) 10-325 MG tablet    Sig: Take 1-2 tablets by mouth every 6 (six) hours as needed for moderate pain.    Dispense:  20 tablet    Refill:  0    May fill today  . mirtazapine (REMERON) 15 MG tablet    Sig: Take 1 tablet (15 mg total) by mouth at bedtime.   Over 40 min spent in face-to-face evaluation of and consultation with patient and coordination of care.  Over 50% of this time was spent counseling this patient.  I personally performed the services described in this documentation, which was scribed in my presence. The recorded information has been reviewed and considered, and addended by me as needed.  [name], MD MPH   By signing my name below, I, [name], attest that this documentation has been prepared under the direction and in the presence of [name], MD. Electronically Signed: [name], [place]. 04/05/2015 , 9:46 AM .   Results for orders placed or performed in visit on 04/05/15  Comprehensive metabolic panel  Result Value Ref Range   Sodium 143 135 - 146 mmol/L   Potassium 4.3 3.5 - 5.3 mmol/L   Chloride 106 98 - 110 mmol/L   CO2 30 20 - 31 mmol/L   Glucose, Bld 75 65 - 99 mg/dL   BUN 15 7 - 25 mg/dL   Creat 0.90 0.50 - 0.99 mg/dL   Total Bilirubin 0.3 0.2 - 1.2 mg/dL   Alkaline Phosphatase 93 33 - 130 U/L   AST 18 10 - 35 U/L   ALT 15 6 - 29 U/L   Total Protein 4.3 (L) 6.1 - 8.1 g/dL   Albumin 2.3 (L) 3.6 - 5.1 g/dL   Calcium 7.5 (L) 8.6 - 10.4 mg/dL  POCT CBC  Result Value Ref Range   WBC 6.3 4.6 - 10.2 K/uL   Lymph, poc 2.0 0.6 - 3.4   POC LYMPH PERCENT 31.7 10 - 50 %L   MID (cbc) 0.7 0 - 0.9   POC MID % 10.4 0 - 12 %M   POC Granulocyte 3.6 2 - 6.9    Granulocyte percent 57.9 37 - 80 %G   RBC 3.35 (A) 4.04 - 5.48 M/uL   Hemoglobin 10.3 (A) 12.2 - 16.2 g/dL   HCT, POC 31.8 (A) 37.7 - 47.9 %   MCV 94.9 80 - 97 fL   MCH, POC 30.7 27 - 31.2 pg   MCHC 32.3 31.8 - 35.4 g/dL   RDW, POC 18.5 %   Platelet Count, POC 302 142 - 424 K/uL   MPV 7.8 0 - 99.8 fL

## 2015-04-05 NOTE — Patient Instructions (Addendum)
You need to go sign to get your records from [facility].  As soon as you do this, I can get in a referral to a different pain management.  We can try [facility] or [facility].  Another option is the [facility] - you will get better pain control with them.  You need to have an appointment with a psychiatrist by Monday if you want any refills on pain medicine.  Start the boost/ensure - you need protein to help with the healing and swelling.  Take the lasix and potassium once a day in the morning.  Decrease your mirtazapine to 1/2 tab at night. We will start you on cymbalta at your next visit.   Do NOT take off unna boots AT ALL. Do NOT remove ANY wrappings in lower legs.  If you have any issues, you NEED to return to the clinic.    YOU NEED TO CALL YOUR INSURANCE TO ASK WHAT PSYCHIATRIC PROVIDERS IN [place] ARE IN YOUR INSURANCE NETWORK AND ACCEPTING NEW PATIENTS - THEY SHOULD GIVE YOU A LIST OF NAMES/CLINICS AND PHONE NUMBERS AND GO DOWN THE LIST.  OR YOU CAN TRY BELOW  No psychological or psychiatric services take physician referrals - they always want the patient to call. Some excellent private psychiatrists for MEDICATION MANAGEMENT are:  [facility] [place]  [place], [place] [address] Phone: [phone]  [facility] [address] [place], [place] [address]  [phone]  [facility] [address] [place], [place] Phone: [place]  [address] Carolynne Edouard [place], [place] [address]  Phone:([phone]) [place], MD, [place], [place], [place] [address] Phone: [phone]    Peripheral Vascular Disease Peripheral vascular disease (PVD) is a disease of the blood vessels that are not part of your heart and brain. A simple term for PVD is poor circulation. In most  cases, PVD narrows the blood vessels that carry blood from your heart to the rest of your body. This can result in a decreased supply of blood to your arms, legs, and internal organs, like your stomach or kidneys. However, it most often affects a person's lower legs and feet. There are two types of PVD.  Organic PVD. This is the more common type. It is caused by damage to the structure of blood vessels.  Functional PVD. This is caused by conditions that make blood vessels contract and tighten (spasm). Without treatment, PVD tends to get worse over time. PVD can also lead to acute ischemic limb. This is when an arm or limb suddenly has trouble getting enough blood. This is a medical emergency. CAUSES Each type of PVD has many different causes. The most common cause of PVD is buildup of a fatty material (plaque) inside of your arteries (atherosclerosis). Small amounts of plaque can break off from the walls of the blood vessels and become lodged in a smaller artery. This blocks blood flow and can cause acute ischemic limb. Other common causes of PVD include:  Blood clots that form inside of blood vessels.  Injuries to blood vessels.  Diseases that cause inflammation of blood vessels or cause blood vessel spasms.  Health behaviors and health history that increase your risk of developing PVD. RISK FACTORS  You may have a greater risk of PVD if you:  Have a family history of PVD.  Have certain medical conditions, including:  High  cholesterol.  Diabetes.  High blood pressure (hypertension).  Coronary heart disease.  Past problems with blood clots.  Past injury, such as burns or a broken bone. These may have damaged blood vessels in your limbs.  Buerger disease. This is caused by inflamed blood vessels in your hands and feet.  Some forms of arthritis.  Rare birth defects that affect the arteries in your legs.  Use tobacco.  Do not get enough exercise.  Are obese.  Are age 63  or older. SIGNS AND SYMPTOMS  PVD may cause many different symptoms. Your symptoms depend on what part of your body is not getting enough blood. Some common signs and symptoms include:  Cramps in your lower legs. This may be a symptom of poor leg circulation (claudication).  Pain and weakness in your legs while you are physically active that goes away when you rest (intermittent claudication).  Leg pain when at rest.  Leg numbness, tingling, or weakness.  Coldness in a leg or foot, especially when compared with the other leg.  Skin or hair changes. These can include:  Hair loss.  Shiny skin.  Pale or bluish skin.  Thick toenails.  Inability to get or maintain an erection (erectile dysfunction). People with PVD are more prone to developing ulcers and sores on their toes, feet, or legs. These may take longer than normal to heal. DIAGNOSIS Your health care provider may diagnose PVD from your signs and symptoms. The health care provider will also do a physical exam. You may have tests to find out what is causing your PVD and determine its severity. Tests may include:  Blood pressure recordings from your arms and legs and measurements of the strength of your pulses (pulse volume recordings).  Imaging studies using sound waves to take pictures of the blood flow through your blood vessels (Doppler ultrasound).  Injecting a dye into your blood vessels before having imaging studies using:  X-rays (angiogram or arteriogram).  Computer-generated X-rays (CT angiogram).  A powerful electromagnetic field and a computer (magnetic resonance angiogram or MRA). TREATMENT Treatment for PVD depends on the cause of your condition and the severity of your symptoms. It also depends on your age. Underlying causes need to be treated and controlled. These include long-lasting (chronic) conditions, such as diabetes, high cholesterol, and high blood pressure. You may need to first try making lifestyle  changes and taking medicines. Surgery may be needed if these do not work. Lifestyle changes may include:  Quitting smoking.  Exercising regularly.  Following a low-fat, low-cholesterol diet. Medicines may include:  Blood thinners to prevent blood clots.  Medicines to improve blood flow.  Medicines to improve your blood cholesterol levels. Surgical procedures may include:  A procedure that uses an inflated balloon to open a blocked artery and improve blood flow (angioplasty).  A procedure to put in a tube (stent) to keep a blocked artery open (stent implant).  Surgery to reroute blood flow around a blocked artery (peripheral bypass surgery).  Surgery to remove dead tissue from an infected wound on the affected limb.  Amputation. This is surgical removal of the affected limb. This may be necessary in cases of acute ischemic limb that are not improved through medical or surgical treatments. HOME CARE INSTRUCTIONS  Take medicines only as directed by your health care provider.  Do not use any tobacco products, including cigarettes, chewing tobacco, or electronic cigarettes. If you need help quitting, ask your health care provider.  Lose weight if you are overweight,  and maintain a healthy weight as directed by your health care provider.  Eat a diet that is low in fat and cholesterol. If you need help, ask your health care provider.  Exercise regularly. Ask your health care provider to suggest some good activities for you.  Use compression stockings or other mechanical devices as directed by your health care provider.  Take good care of your feet.  Wear comfortable shoes that fit well.  Check your feet often for any cuts or sores. SEEK MEDICAL CARE IF:  You have cramps in your legs while walking.  You have leg pain when you are at rest.  You have coldness in a leg or foot.  Your skin changes.  You have erectile dysfunction.  You have cuts or sores on your feet that  are not healing. SEEK IMMEDIATE MEDICAL CARE IF:  Your arm or leg turns cold and blue.  Your arms or legs become red, warm, swollen, painful, or numb.  You have chest pain or trouble breathing.  You suddenly have weakness in your face, arm, or leg.  You become very confused or lose the ability to speak.  You suddenly have a very bad headache or lose your vision.   This information is not intended to replace advice given to you by your health care provider. Make sure you discuss any questions you have with your health care provider.   Document Released: 07/18/2004 Document Revised: 07/01/2014 Document Reviewed: 11/18/2013 Elsevier Interactive Patient Education [employer].

## 2015-04-05 NOTE — Telephone Encounter (Signed)
Pt wanted to make sure Dr Brigitte Pulse know she have an appt with Krystal Eaton at the [facility]  On October 17th at 9:45 and she will let her know the outcome

## 2015-04-06 ENCOUNTER — Ambulatory Visit (HOSPITAL_COMMUNITY)
Admission: RE | Admit: 2015-04-06 | Discharge: 2015-04-06 | Disposition: A | Discharge status: Home / Self Care | Payer: Medicare Other | Source: Ambulatory Visit | Attending: Family Medicine | Admitting: Family Medicine

## 2015-04-06 ENCOUNTER — Telehealth: Payer: Self-pay | Admitting: Radiology

## 2015-04-06 ENCOUNTER — Ambulatory Visit (HOSPITAL_BASED_OUTPATIENT_CLINIC_OR_DEPARTMENT_OTHER)
Admission: RE | Admit: 2015-04-06 | Discharge: 2015-04-06 | Disposition: A | Discharge status: Home / Self Care | Payer: Medicare Other | Source: Ambulatory Visit | Attending: Family Medicine | Admitting: Family Medicine

## 2015-04-06 ENCOUNTER — Ambulatory Visit (HOSPITAL_BASED_OUTPATIENT_CLINIC_OR_DEPARTMENT_OTHER)
Admission: RE | Admit: 2015-04-06 | Discharge: 2015-04-06 | Disposition: A | Discharge status: Home / Self Care | Payer: Medicare Other | Source: Ambulatory Visit

## 2015-04-06 ENCOUNTER — Ambulatory Visit (INDEPENDENT_AMBULATORY_CARE_PROVIDER_SITE_OTHER): Payer: Medicare Other | Admitting: Family Medicine

## 2015-04-06 ENCOUNTER — Other Ambulatory Visit: Payer: Self-pay | Admitting: Family Medicine

## 2015-04-06 VITALS — BP 92/60 | HR 91 | Temp 98.7°F | Resp 14 | Ht 63.0 in | Wt 104.0 lb

## 2015-04-06 DIAGNOSIS — Z79899 Other long term (current) drug therapy: Secondary | ICD-10-CM | POA: Diagnosis not present

## 2015-04-06 DIAGNOSIS — R600 Localized edema: Secondary | ICD-10-CM

## 2015-04-06 DIAGNOSIS — I872 Venous insufficiency (chronic) (peripheral): Secondary | ICD-10-CM | POA: Diagnosis not present

## 2015-04-06 DIAGNOSIS — J449 Chronic obstructive pulmonary disease, unspecified: Secondary | ICD-10-CM | POA: Diagnosis not present

## 2015-04-06 DIAGNOSIS — I70219 Atherosclerosis of native arteries of extremities with intermittent claudication, unspecified extremity: Secondary | ICD-10-CM

## 2015-04-06 DIAGNOSIS — I2510 Atherosclerotic heart disease of native coronary artery without angina pectoris: Secondary | ICD-10-CM | POA: Diagnosis not present

## 2015-04-06 DIAGNOSIS — Z800 Family history of malignant neoplasm of digestive organs: Secondary | ICD-10-CM | POA: Diagnosis not present

## 2015-04-06 DIAGNOSIS — F1193 Opioid use, unspecified with withdrawal: Secondary | ICD-10-CM | POA: Diagnosis not present

## 2015-04-06 DIAGNOSIS — I70232 Atherosclerosis of native arteries of right leg with ulceration of calf: Secondary | ICD-10-CM

## 2015-04-06 DIAGNOSIS — E43 Unspecified severe protein-calorie malnutrition: Secondary | ICD-10-CM | POA: Diagnosis not present

## 2015-04-06 DIAGNOSIS — Z7982 Long term (current) use of aspirin: Secondary | ICD-10-CM | POA: Diagnosis not present

## 2015-04-06 DIAGNOSIS — Z8051 Family history of malignant neoplasm of kidney: Secondary | ICD-10-CM | POA: Diagnosis not present

## 2015-04-06 DIAGNOSIS — Z823 Family history of stroke: Secondary | ICD-10-CM | POA: Diagnosis not present

## 2015-04-06 DIAGNOSIS — Z681 Body mass index (BMI) 19 or less, adult: Secondary | ICD-10-CM | POA: Diagnosis not present

## 2015-04-06 DIAGNOSIS — Z9884 Bariatric surgery status: Secondary | ICD-10-CM | POA: Diagnosis not present

## 2015-04-06 DIAGNOSIS — L97909 Non-pressure chronic ulcer of unspecified part of unspecified lower leg with unspecified severity: Secondary | ICD-10-CM

## 2015-04-06 DIAGNOSIS — F17210 Nicotine dependence, cigarettes, uncomplicated: Secondary | ICD-10-CM | POA: Diagnosis not present

## 2015-04-06 DIAGNOSIS — I739 Peripheral vascular disease, unspecified: Secondary | ICD-10-CM

## 2015-04-06 DIAGNOSIS — K219 Gastro-esophageal reflux disease without esophagitis: Secondary | ICD-10-CM | POA: Diagnosis not present

## 2015-04-06 DIAGNOSIS — M810 Age-related osteoporosis without current pathological fracture: Secondary | ICD-10-CM | POA: Diagnosis not present

## 2015-04-06 DIAGNOSIS — F339 Major depressive disorder, recurrent, unspecified: Secondary | ICD-10-CM | POA: Diagnosis not present

## 2015-04-06 DIAGNOSIS — F419 Anxiety disorder, unspecified: Secondary | ICD-10-CM | POA: Diagnosis not present

## 2015-04-06 DIAGNOSIS — R45851 Suicidal ideations: Secondary | ICD-10-CM | POA: Diagnosis not present

## 2015-04-06 DIAGNOSIS — G894 Chronic pain syndrome: Secondary | ICD-10-CM | POA: Diagnosis not present

## 2015-04-06 DIAGNOSIS — R0902 Hypoxemia: Secondary | ICD-10-CM | POA: Diagnosis not present

## 2015-04-06 DIAGNOSIS — Z8711 Personal history of peptic ulcer disease: Secondary | ICD-10-CM | POA: Diagnosis not present

## 2015-04-06 DIAGNOSIS — N2581 Secondary hyperparathyroidism of renal origin: Secondary | ICD-10-CM | POA: Diagnosis not present

## 2015-04-06 NOTE — Progress Notes (Signed)
VASCULAR LAB PRELIMINARY  ARTERIAL  ABI completed:    RIGHT    LEFT    PRESSURE WAVEFORM  PRESSURE WAVEFORM  BRACHIAL 109 Triphasic BRACHIAL 115 Triphasic  DP 132 Triphasic DP 70 Triphasic  AT   AT    PT 87 Triphasic PT 129 Biphasic  PER   PER    GREAT TOE 38 NA GREAT TOE 29 NA    RIGHT LEFT  ABI 1.15 1.12  TBI 0.33 0.25    Bilateral ABIs are within normal limits. Bilateral TBIs are abnormal.  04/06/2015 1:04 PM Maudry Mayhew, RVT, RDCS, RDMS

## 2015-04-06 NOTE — Progress Notes (Signed)
Chief Complaint:  Chief Complaint  Patient presents with  . needs legs rewrappped    See previous visit with  Dr. Brigitte Pulse    HPI: [name] is a 64 y.o. female who reports to [facility] today complaining of needs una boot redone since had vascular studies done. Dr Brigitte Pulse prefers una boots sicne noncompliant.   Past Medical History  Diagnosis Date  . Peptic ulcer disease     dr Oletta Lamas  . Acute duodenal ulcer with hemorrhage and perforation, with obstruction ([place])   . Barrett's esophagus   . Menopause   . GERD (gastroesophageal reflux disease)   . Asthma   . Chronic abdominal pain     narcotic dependence, dr gyarteng-dak at [facility]  . Narcotic abuse   . Anxiety     sees [name] NP at Dr. Radonna Ricker office  . Fx two ribs-open 08-24-11    left 4th and 5th  . Fx of fibula 07-28-11    left fibula, 2 places   . COPD (chronic obstructive pulmonary disease) ([place])   . Allergy   . Anemia   . History of blood transfusion     "related to OR; maybe when I perforated that ulcer"  . Lap Nissen + truncal vagotomy July 2008 05/13/2013  . LEG EDEMA 01/19/2008    Qualifier: Diagnosis of  By: [name] MD, [name]   . ACUTE DUODEN ULCER W/HEMORR PERF&OBSTRUCTION 06/26/2004    Qualifier: Diagnosis of  By: [name] MD, [name]   . FEVER, RECURRENT 08/11/2009    Qualifier: Diagnosis of  By: [name] MD, [name] MENOPAUSE 01/07/2007    Qualifier: Diagnosis of  By: [name], CMA, [place]    . Gastroparesis 06/03/2007    Qualifier: Diagnosis of  By: [name] MD, [name]   . GASTRIC OUTLET OBSTRUCTION 08/28/2007    In July 2008 she underwent laparoscopic enterolysis, Nissen fundoplication over a #78 bougie, single pledgeted suture colosure of the hiatus and a 3 suture wrap.  Lap truncal vagotomy and a loop gastrojejunostomy was performed.  This was revised in August of 2009 to a roux en Y gastrojejujostomy.     . 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 Depression   .  History of hiatal hernia   . Complication of anesthesia     pt states had too much xanax on board - agitated   . Anginal pain ([place])     "many many years ago"  . Exertional shortness of breath   . Pneumonia, organism unspecified 11/07/2010    Assoc with R Parapneumonic effusion 09/2010    - Tapped 10/05/10    - CxR resolved 11/07/2010    . Headache     "monthly" (09/06/2014)  . Arthritis     "hips" (09/06/2014)  . Myocardial infarction [facility])    Past Surgical History  Procedure Laterality Date  . Esophagogastroduodenoscopy  12-29-09    dr qadeer at [facility], several gastric ulcers  . Laparoscopic nissen fundoplication    . 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 Repair of perforated ulcer    . Biopsy thyroid  08-13-11    benign nodule, per Dr. Melida Quitter   . Esophagogastroduodenoscopy N/A 09/25/2012    Procedure: ESOPHAGOGASTRODUODENOSCOPY (EGD);  Surgeon: Beryle Beams, MD;  Location: [facility];  Service: Endoscopy;  Laterality: N/A;  . Fracture surgery    . Tonsillectomy    .  Cataract extraction w/ intraocular lens  implant, bilateral Bilateral   . Laparoscopic partial gastrectomy N/A 06/29/2013    Procedure: Gastrectomy and GJ Tube placement;  Surgeon: Pedro Earls, MD;  Location: [facility];  Service: General;  Laterality: N/A;  . Colonoscopy    . Gastrostomy N/A 07/26/2014    Procedure: LAPRASCOPIC ASSISTED OPEN PLACEMENT OF JEJUNOSTOMY TUBE;  Surgeon: Pedro Earls, MD;  Location: [facility];  Service: General;  Laterality: N/A;  . Orif hip fracture Bilateral     "pins in both of them"  . Dilation and curettage of uterus    . Hernia repair      "hiatal"  . Joint replacement     Social History   Social History  . Marital Status: Divorced    Spouse Name: N/A  . Number of Children: N/A  . Years of Education: N/A   Social History Main Topics  . Smoking status: Current Every Day Smoker -- 2.00 packs/day for 50 years    Types: Cigarettes  . Smokeless tobacco: Never  Used  . Alcohol Use: No  . Drug Use: No  . Sexual Activity: No   Other Topics Concern  . None   Social History Narrative   Family History  Problem Relation Age of Onset  . Cancer Mother     kidney, magliant tumor on face  . Stroke Mother   . Celiac disease Father   . Cancer Father     kidney and pancreatic   Allergies  Allergen Reactions  . Lithium Nausea And Vomiting  . Celebrex [Celecoxib] Nausea Only  . Morphine Itching    REACTION: itching  . Quetiapine     REACTION: tardive dyskinesia  . Varenicline Tartrate     REACTION: unspecified   Prior to Admission medications   Medication Sig Start Date End Date Taking? Authorizing Provider  ALPRAZolam [name]) 1 MG tablet Take 1 tablet (1 mg total) by mouth 3 (three) times daily as needed for anxiety. 03/26/15  Yes Shawnee Knapp, MD  aspirin EC 81 MG tablet Take 81 mg by mouth daily.   Yes Historical Provider, MD  cephALEXin (KEFLEX) 500 MG capsule Take 1 capsule (500 mg total) by mouth 2 (two) times daily. 04/03/15  Yes Gay Filler Copland, MD  diazepam (VALIUM) 5 MG tablet Take 1 tablet (5 mg total) by mouth every 12 (twelve) hours as needed for anxiety. 03/14/15  Yes Shawnee Knapp, MD  ergocalciferol (VITAMIN D2) 50000 UNITS capsule Take 1 capsule (50,000 Units total) by mouth once a week. 03/05/15  Yes Shawnee Knapp, MD  feeding supplement (BOOST / RESOURCE BREEZE) LIQD Take 1 Container by mouth 3 (three) times daily between meals. 04/05/15  Yes Shawnee Knapp, MD  fentaNYL (DURAGESIC - DOSED MCG/HR) 12 MCG/HR Place 1 patch (12.5 mcg total) onto the skin every 3 (three) days. 04/05/15  Yes Shawnee Knapp, MD  furosemide (LASIX) 20 MG tablet Take 1 tablet (20 mg total) by mouth 2 (two) times daily. Take 1 with breakfast and 1 with lunch as needed for leg swelling 03/30/15  Yes Shawnee Knapp, MD  HYDROcodone-acetaminophen [facility]) 10-325 MG tablet Take 1-2 tablets by mouth every 6 (six) hours as needed for moderate pain. 04/05/15  Yes Shawnee Knapp, MD    mirtazapine (REMERON) 15 MG tablet Take 1 tablet (15 mg total) by mouth at bedtime. 04/05/15  Yes Shawnee Knapp, MD  Multiple Vitamin (MULTIVITAMIN WITH MINERALS) TABS tablet Take 1 tablet  by mouth daily.   Yes Historical Provider, MD  Multiple Vitamins-Minerals (OCUVITE PO) Take 1 tablet by mouth daily.   Yes Historical Provider, MD  OLANZapine (ZYPREXA) 7.5 MG tablet Take 1 tablet (7.5 mg total) by mouth at bedtime. 03/05/15  Yes Shawnee Knapp, MD  ondansetron (ZOFRAN ODT) 4 MG disintegrating tablet Take 1-2 tablets (4-8 mg total) by mouth every 8 (eight) hours as needed for nausea or vomiting. 03/30/15  Yes Shawnee Knapp, MD  pantoprazole (PROTONIX) 40 MG tablet Take 1 tablet (40 mg total) by mouth 2 (two) times daily before a meal. 03/05/15  Yes Shawnee Knapp, MD  potassium chloride SA (K-DUR,KLOR-CON) 20 MEQ tablet Take 1 tablet (20 mEq total) by mouth daily. 03/30/15  Yes Shawnee Knapp, MD     ROS: The patient denies fevers, chills, night sweats, unintentional weight loss, chest pain, palpitations, wheezing, dyspnea on exertion, nausea, vomiting, abdominal pain, dysuria, hematuria, melena  All other systems have been reviewed and were otherwise negative with the exception of those mentioned in the HPI and as above.    PHYSICAL EXAM: Filed Vitals:   04/06/15 1223  BP: 92/60  Pulse: 91  Temp: 98.7 F (37.1 C)  Resp: 14   Body mass index is 18.43 kg/(m^2).   General: Alert, no acute distress HEENT:  Normocephalic, atraumatic, oropharynx patent. EOMI, PERRLA Skin: + superficail ulcer on calf Neurologic: Facial musculature symmetric. Psychiatric: Patient acts appropriately throughout our interaction. Musculoskeletal: Gait intact with cane . Minimal trace edema   LABS: Results for orders placed or performed in visit on 04/05/15  Comprehensive metabolic panel  Result Value Ref Range   Sodium 143 135 - 146 mmol/L   Potassium 4.3 3.5 - 5.3 mmol/L   Chloride 106 98 - 110 mmol/L   CO2 30 20 - 31  mmol/L   Glucose, Bld 75 65 - 99 mg/dL   BUN 15 7 - 25 mg/dL   Creat 0.90 0.50 - 0.99 mg/dL   Total Bilirubin 0.3 0.2 - 1.2 mg/dL   Alkaline Phosphatase 93 33 - 130 U/L   AST 18 10 - 35 U/L   ALT 15 6 - 29 U/L   Total Protein 4.3 (L) 6.1 - 8.1 g/dL   Albumin 2.3 (L) 3.6 - 5.1 g/dL   Calcium 7.5 (L) 8.6 - 10.4 mg/dL  POCT CBC  Result Value Ref Range   WBC 6.3 4.6 - 10.2 K/uL   Lymph, poc 2.0 0.6 - 3.4   POC LYMPH PERCENT 31.7 10 - 50 %L   MID (cbc) 0.7 0 - 0.9   POC MID % 10.4 0 - 12 %M   POC Granulocyte 3.6 2 - 6.9   Granulocyte percent 57.9 37 - 80 %G   RBC 3.35 (A) 4.04 - 5.48 M/uL   Hemoglobin 10.3 (A) 12.2 - 16.2 g/dL   HCT, POC 31.8 (A) 37.7 - 47.9 %   MCV 94.9 80 - 97 fL   MCH, POC 30.7 27 - 31.2 pg   MCHC 32.3 31.8 - 35.4 g/dL   RDW, POC 18.5 %   Platelet Count, POC 302 142 - 424 K/uL   MPV 7.8 0 - 99.8 fL     EKG/XRAY:   Primary read interpreted by Dr. Marin Comment at [facility].   ASSESSMENT/PLAN: Encounter Diagnoses  Name Primary?  . Bilateral leg edema Yes  . Venous stasis ulcer of leg without varicose veins (HCC)    Una boot rewrapped bilaterally Fu with Dr Brigitte Pulse  as directed  Gross sideeffects, risk and benefits, and alternatives of medications d/w patient. Patient is aware that all medications have potential sideeffects and we are unable to predict every sideeffect or drug-drug interaction that may occur.  Thao Le DO  04/06/2015 3:11 PM

## 2015-04-06 NOTE — Telephone Encounter (Signed)
Patient has presented for her ABI's / vascular study. The tech has called, she will have to remove the patients wraps. I have advised ok to remove her wraps on her lower legs for the study, patient will have to return here after the study to have her legs rewrapped.  If you get this message today, call me with orders, I can make sure she is re wrapped appropriately. Thank you

## 2015-04-08 ENCOUNTER — Inpatient Hospital Stay (HOSPITAL_COMMUNITY)
Admission: AD | Admit: 2015-04-08 | Discharge: 2015-04-09 | DRG: 205 | Disposition: A | Discharge status: Home / Self Care | Payer: Medicare Other | Source: Ambulatory Visit | Attending: Family Medicine | Admitting: Family Medicine

## 2015-04-08 ENCOUNTER — Ambulatory Visit (INDEPENDENT_AMBULATORY_CARE_PROVIDER_SITE_OTHER): Payer: Medicare Other | Admitting: Family Medicine

## 2015-04-08 ENCOUNTER — Other Ambulatory Visit: Payer: Self-pay

## 2015-04-08 VITALS — BP 100/60

## 2015-04-08 DIAGNOSIS — R0602 Shortness of breath: Secondary | ICD-10-CM | POA: Diagnosis not present

## 2015-04-08 DIAGNOSIS — F339 Major depressive disorder, recurrent, unspecified: Secondary | ICD-10-CM | POA: Diagnosis present

## 2015-04-08 DIAGNOSIS — R45851 Suicidal ideations: Secondary | ICD-10-CM | POA: Diagnosis present

## 2015-04-08 DIAGNOSIS — Z8051 Family history of malignant neoplasm of kidney: Secondary | ICD-10-CM | POA: Diagnosis not present

## 2015-04-08 DIAGNOSIS — G8929 Other chronic pain: Secondary | ICD-10-CM | POA: Diagnosis present

## 2015-04-08 DIAGNOSIS — K567 Ileus, unspecified: Secondary | ICD-10-CM | POA: Diagnosis not present

## 2015-04-08 DIAGNOSIS — F332 Major depressive disorder, recurrent severe without psychotic features: Secondary | ICD-10-CM | POA: Diagnosis not present

## 2015-04-08 DIAGNOSIS — N2581 Secondary hyperparathyroidism of renal origin: Secondary | ICD-10-CM | POA: Diagnosis present

## 2015-04-08 DIAGNOSIS — K219 Gastro-esophageal reflux disease without esophagitis: Secondary | ICD-10-CM | POA: Diagnosis present

## 2015-04-08 DIAGNOSIS — F17210 Nicotine dependence, cigarettes, uncomplicated: Secondary | ICD-10-CM | POA: Diagnosis present

## 2015-04-08 DIAGNOSIS — G894 Chronic pain syndrome: Secondary | ICD-10-CM | POA: Diagnosis present

## 2015-04-08 DIAGNOSIS — Z9884 Bariatric surgery status: Secondary | ICD-10-CM | POA: Diagnosis not present

## 2015-04-08 DIAGNOSIS — F331 Major depressive disorder, recurrent, moderate: Secondary | ICD-10-CM

## 2015-04-08 DIAGNOSIS — R109 Unspecified abdominal pain: Secondary | ICD-10-CM

## 2015-04-08 DIAGNOSIS — Z681 Body mass index (BMI) 19 or less, adult: Secondary | ICD-10-CM | POA: Diagnosis not present

## 2015-04-08 DIAGNOSIS — F411 Generalized anxiety disorder: Secondary | ICD-10-CM

## 2015-04-08 DIAGNOSIS — M810 Age-related osteoporosis without current pathological fracture: Secondary | ICD-10-CM | POA: Diagnosis present

## 2015-04-08 DIAGNOSIS — E46 Unspecified protein-calorie malnutrition: Secondary | ICD-10-CM

## 2015-04-08 DIAGNOSIS — Z903 Acquired absence of stomach [part of]: Secondary | ICD-10-CM

## 2015-04-08 DIAGNOSIS — E43 Unspecified severe protein-calorie malnutrition: Secondary | ICD-10-CM | POA: Diagnosis not present

## 2015-04-08 DIAGNOSIS — F419 Anxiety disorder, unspecified: Secondary | ICD-10-CM | POA: Diagnosis present

## 2015-04-08 DIAGNOSIS — R0902 Hypoxemia: Principal | ICD-10-CM | POA: Diagnosis present

## 2015-04-08 DIAGNOSIS — I2510 Atherosclerotic heart disease of native coronary artery without angina pectoris: Secondary | ICD-10-CM | POA: Diagnosis present

## 2015-04-08 DIAGNOSIS — Z823 Family history of stroke: Secondary | ICD-10-CM

## 2015-04-08 DIAGNOSIS — J449 Chronic obstructive pulmonary disease, unspecified: Secondary | ICD-10-CM | POA: Diagnosis not present

## 2015-04-08 DIAGNOSIS — Z800 Family history of malignant neoplasm of digestive organs: Secondary | ICD-10-CM | POA: Diagnosis not present

## 2015-04-08 DIAGNOSIS — F1120 Opioid dependence, uncomplicated: Secondary | ICD-10-CM

## 2015-04-08 DIAGNOSIS — E559 Vitamin D deficiency, unspecified: Secondary | ICD-10-CM | POA: Diagnosis not present

## 2015-04-08 DIAGNOSIS — Z79899 Other long term (current) drug therapy: Secondary | ICD-10-CM

## 2015-04-08 DIAGNOSIS — Z8711 Personal history of peptic ulcer disease: Secondary | ICD-10-CM | POA: Diagnosis not present

## 2015-04-08 DIAGNOSIS — Z7982 Long term (current) use of aspirin: Secondary | ICD-10-CM | POA: Diagnosis not present

## 2015-04-08 DIAGNOSIS — J90 Pleural effusion, not elsewhere classified: Secondary | ICD-10-CM | POA: Diagnosis not present

## 2015-04-08 DIAGNOSIS — F1193 Opioid use, unspecified with withdrawal: Secondary | ICD-10-CM | POA: Diagnosis present

## 2015-04-08 DIAGNOSIS — K5909 Other constipation: Secondary | ICD-10-CM | POA: Diagnosis present

## 2015-04-08 HISTORY — DX: Other artificial openings of gastrointestinal tract status: Z93.4

## 2015-04-08 LAB — COMPREHENSIVE METABOLIC PANEL
ALBUMIN: 2 g/dL — AB (ref 3.5–5.0)
ALT: 16 U/L (ref 14–54)
ANION GAP: 8 (ref 5–15)
AST: 25 U/L (ref 15–41)
Alkaline Phosphatase: 72 U/L (ref 38–126)
BILIRUBIN TOTAL: 0.5 mg/dL (ref 0.3–1.2)
BUN: 22 mg/dL — ABNORMAL HIGH (ref 6–20)
CO2: 27 mmol/L (ref 22–32)
Calcium: 8 mg/dL — ABNORMAL LOW (ref 8.9–10.3)
Chloride: 107 mmol/L (ref 101–111)
Creatinine, Ser: 0.88 mg/dL (ref 0.44–1.00)
GFR calc Af Amer: >60 mL/min (ref >60)
GLUCOSE: 95 mg/dL (ref 65–99)
POTASSIUM: 3.6 mmol/L (ref 3.5–5.1)
Sodium: 142 mmol/L (ref 135–145)
TOTAL PROTEIN: 4.3 g/dL — AB (ref 6.5–8.1)

## 2015-04-08 LAB — CBC WITH DIFFERENTIAL/PLATELET
BASOS ABS: 0.1 10*3/uL (ref 0.0–0.1)
BASOS PCT: 1 %
EOS ABS: 0.2 10*3/uL (ref 0.0–0.7)
Eosinophils Relative: 3 %
HCT: 27.2 % — ABNORMAL LOW (ref 36.0–46.0)
HEMOGLOBIN: 8.6 g/dL — AB (ref 12.0–15.0)
Lymphocytes Relative: 21 %
Lymphs Abs: 1.4 10*3/uL (ref 0.7–4.0)
MCH: 31 pg (ref 26.0–34.0)
MCHC: 31.6 g/dL (ref 30.0–36.0)
MCV: 98.2 fL (ref 78.0–100.0)
Monocytes Absolute: 0.6 10*3/uL (ref 0.1–1.0)
Monocytes Relative: 10 %
NEUTROS PCT: 65 %
Neutro Abs: 4.2 10*3/uL (ref 1.7–7.7)
Platelets: 348 10*3/uL (ref 150–400)
RBC: 2.77 MIL/uL — AB (ref 3.87–5.11)
RDW: 16.9 % — ABNORMAL HIGH (ref 11.5–15.5)
WBC: 6.5 10*3/uL (ref 4.0–10.5)

## 2015-04-08 LAB — PREALBUMIN: PREALBUMIN: 20.4 mg/dL (ref 18–38)

## 2015-04-08 LAB — MAGNESIUM: MAGNESIUM: 2 mg/dL (ref 1.7–2.4)

## 2015-04-08 LAB — PHOSPHORUS: Phosphorus: 3.6 mg/dL (ref 2.5–4.6)

## 2015-04-08 MED ORDER — ERGOCALCIFEROL 1.25 MG (50000 UT) PO CAPS: 50000.0000 [IU] | ORAL_CAPSULE | ORAL | Status: DC

## 2015-04-08 MED ORDER — CEPHALEXIN 500 MG PO CAPS
500.0000 mg | ORAL_CAPSULE | Freq: Two times a day (BID) | ORAL | Status: DC
  Administered 2015-04-08 – 2015-04-09 (×2): 500 mg via ORAL
  Filled 2015-04-08 (×2): qty 1

## 2015-04-08 MED ORDER — ALPRAZOLAM 0.5 MG PO TABS
1.0000 mg | ORAL_TABLET | Freq: Three times a day (TID) | ORAL | Status: DC | PRN
  Administered 2015-04-08 – 2015-04-09 (×2): 1 mg via ORAL
  Filled 2015-04-08 (×3): qty 2

## 2015-04-08 MED ORDER — MIRTAZAPINE 15 MG PO TABS
15.0000 mg | ORAL_TABLET | Freq: Every day | ORAL | Status: DC
  Administered 2015-04-08: 15 mg via ORAL
  Filled 2015-04-08: qty 1

## 2015-04-08 MED ORDER — ALPRAZOLAM 0.25 MG PO TABS: 0.5000 mg | ORAL_TABLET | Freq: Once | ORAL | Status: DC

## 2015-04-08 MED ORDER — ASPIRIN EC 81 MG PO TBEC
81.0000 mg | DELAYED_RELEASE_TABLET | Freq: Every day | ORAL | Status: DC
  Administered 2015-04-08 – 2015-04-09 (×2): 81 mg via ORAL
  Filled 2015-04-08 (×2): qty 1

## 2015-04-08 MED ORDER — DULOXETINE HCL 30 MG PO CPEP
30.0000 mg | ORAL_CAPSULE | Freq: Every day | ORAL | Status: DC
  Administered 2015-04-08: 30 mg via ORAL
  Filled 2015-04-08: qty 1

## 2015-04-08 MED ORDER — DOCUSATE SODIUM 100 MG PO CAPS
100.0000 mg | ORAL_CAPSULE | Freq: Two times a day (BID) | ORAL | Status: DC
  Administered 2015-04-08: 100 mg via ORAL
  Filled 2015-04-08: qty 1

## 2015-04-08 MED ORDER — ENOXAPARIN SODIUM 40 MG/0.4ML ~~LOC~~ SOLN
40.0000 mg | SUBCUTANEOUS | Status: DC
  Administered 2015-04-08: 40 mg via SUBCUTANEOUS
  Filled 2015-04-08: qty 0.4

## 2015-04-08 MED ORDER — PANTOPRAZOLE SODIUM 40 MG PO TBEC
40.0000 mg | DELAYED_RELEASE_TABLET | Freq: Two times a day (BID) | ORAL | Status: DC
  Administered 2015-04-08 – 2015-04-09 (×2): 40 mg via ORAL
  Filled 2015-04-08 (×2): qty 1

## 2015-04-08 MED ORDER — HYDROCODONE-ACETAMINOPHEN 10-325 MG PO TABS
1.0000 | ORAL_TABLET | Freq: Four times a day (QID) | ORAL | Status: DC | PRN
  Administered 2015-04-08: 1 via ORAL
  Administered 2015-04-09 (×3): 2 via ORAL
  Filled 2015-04-08: qty 2
  Filled 2015-04-08: qty 1
  Filled 2015-04-08 (×2): qty 2

## 2015-04-08 MED ORDER — OLANZAPINE 7.5 MG PO TABS
7.5000 mg | ORAL_TABLET | Freq: Every day | ORAL | Status: DC
  Administered 2015-04-08: 7.5 mg via ORAL
  Filled 2015-04-08 (×2): qty 1

## 2015-04-08 MED ORDER — POLYETHYLENE GLYCOL 3350 17 G PO PACK: 17.0000 g | PACK | Freq: Every day | ORAL | Status: DC | PRN

## 2015-04-08 MED ORDER — IPRATROPIUM-ALBUTEROL 0.5-2.5 (3) MG/3ML IN SOLN: 3.0000 mL | RESPIRATORY_TRACT | Status: DC | PRN

## 2015-04-08 MED ORDER — DULOXETINE HCL 30 MG PO CPEP
30.0000 mg | ORAL_CAPSULE | Freq: Every day | ORAL | Status: DC
Start: 2015-04-08 — End: 2015-04-10

## 2015-04-08 MED ORDER — BOOST / RESOURCE BREEZE PO LIQD
1.0000 | Freq: Three times a day (TID) | ORAL | Status: DC
  Administered 2015-04-08 – 2015-04-09 (×3): 1 via ORAL

## 2015-04-08 MED ORDER — ADULT MULTIVITAMIN W/MINERALS CH
1.0000 | ORAL_TABLET | Freq: Every day | ORAL | Status: DC
  Administered 2015-04-08 – 2015-04-09 (×2): 1 via ORAL
  Filled 2015-04-08 (×2): qty 1

## 2015-04-08 MED ORDER — ALPRAZOLAM 0.25 MG PO TABS
0.5000 mg | ORAL_TABLET | Freq: Once | ORAL | Status: AC
  Administered 2015-04-08: 0.5 mg via ORAL

## 2015-04-08 MED ORDER — SODIUM CHLORIDE 0.9 % IV SOLN
INTRAVENOUS | Status: DC
  Administered 2015-04-08: 22:00:00 via INTRAVENOUS

## 2015-04-08 MED ORDER — ONDANSETRON 4 MG PO TBDP
4.0000 mg | ORAL_TABLET | Freq: Three times a day (TID) | ORAL | Status: DC | PRN
  Filled 2015-04-08: qty 2

## 2015-04-08 MED ORDER — NICOTINE 21 MG/24HR TD PT24
21.0000 mg | MEDICATED_PATCH | Freq: Every day | TRANSDERMAL | Status: DC
  Administered 2015-04-08 – 2015-04-09 (×2): 21 mg via TRANSDERMAL
  Filled 2015-04-08 (×2): qty 1
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
## 2015-04-08 NOTE — Patient Instructions (Addendum)
Go through the ED and be registered by the Staff: Tell them that you are direct admit and need to be registered so you can go up to the room. You do not need to  You are going to be on [place] [address]. Family Practice Residence Service - Dr. Ree Kida - I cannot place orders so as soon as you get to your room, please ask the nurse to page the family medicine resident on call for admitting orders. I talked to Dr. Lorenso Courier about your case. I have called Dr. Carlye Grippe office to let him know - I think you owuld be a good candidate for chronic TPN. Also, your depression is uncontrolled - our plan will be to start you on the cymbalta since you have failed lithium, prozac, remeron.   Malnutrition Malnutrition is any condition in which nutrition is poor. There are many forms of malnutrition. A common form is having too little of one kind of nutrient (nutritional deficiency). Nutrients include proteins, minerals, carbohydrates, fats, and vitamins. They provide the body with energy and keep the body working normally. Malnutrition ranges from mild to severe. The condition affects the body's defense system (immune system). Because of this, people who are malnourished are more likely to develop health problems and get sick. CAUSES Causes of malnutrition include:  Eating an unbalanced diet.  Eating too much of certain foods.  Eating too little.  Conditions that decrease the body's ability to use nutrients. RISK FACTORS Risk factors include:  Pregnancy and lactation. Women who are pregnant may become malnourished if they do not increase their nutrient intake. They are also susceptible to folic acid deficiency.  Increasing age. The body's ability to absorb nutrients decreases with age. This can contribute to iron, calcium, and vitamin D deficiencies.  Alcohol or drug dependency. Addiction often leads to a lifestyle in which proper nourishment is ignored. Dependency can also hurt the metabolism and the body's  ability to absorb nutrients. Alcoholism is a major cause of thiamine deficiency and can lead to deficiencies of magnesium, zinc, and other vitamins.  Eating disorders, such as anorexia nervosa. People with these disorders may eat too little or too much.  Chewing or swallowing problems. People with these disorders may not eat enough.  Certain diseases, including:  Long-lasting (chronic) diseases. Chronic diseases tend to affect the absorption of calcium, iron, and vitamins B12, A, D, E, and K.  Liver disease. Liver disease affects the storage of vitamins A and B12. It also interferes with the metabolism of protein and energy sources.  Kidney disease. Kidney disease may cause deficiencies of protein, iron, and vitamin D.  Cancer or AIDS. These diseases can cause a loss of appetite.  Cystic fibrosis. This disease can make it difficult for the body to absorb nutrients.  Certain diets, including.  The vegetarian diet. Vegetarians are at risk for iron deficiency.  The vegan diet. Vegans are susceptible to vitamin B12, calcium, iron, vitamin D, and zinc deficiencies.  The fruitarian diet. This diet can be deficient in protein, sodium, and many micronutrients.  Many commercial "fad" diets, including those that claim to enhance well-being and reduce weight.  Very low calorie diets.  Low income. People with a low income may have trouble paying for nutritious foods. SIGNS AND SYMPTOMS Signs and symptoms depend on the kind of malnutrition you have. Common symptoms include:  Fatigue.  Weakness.  Dizziness.  Fainting  Weight loss.  Poor immune response.  Lack of menstruation.  Hair loss.  Poor memory.  DIAGNOSIS  Malnutrition may be diagnosed by:  A medical history.  A dietary history.  A physical exam. This may include a measurement of your body mass index (BMI).  Blood tests. TREATMENT  Treatments vary depending on the cause of the malnutrition. Common treatments  include:  Dietary changes.  Dietary supplements, such as vitamins and minerals.  Treatment of any underlying conditions. HOME CARE INSTRUCTIONS  Eat a balanced diet.  Take dietary supplements as directed by your health care provider.  Exercise regularly. Exercising can improve appetite.  Keep all follow-up visits as directed by your health care provider. This is important. PREVENTION Eating a well-balanced diet helps to prevent most forms of malnutrition. SEEK MEDICAL CARE IF:  You have increased weakness or fatigue.  You faint.  You stop menstruating.  You have rapid hair loss.  You have unexpected weight loss.   This information is not intended to replace advice given to you by your health care provider. Make sure you discuss any questions you have with your health care provider.   Document Released: 04/26/2005 Document Revised: 07/01/2014 Document Reviewed: 02/04/2014 Elsevier Interactive Patient Education [employer].

## 2015-04-08 NOTE — Consult Note (Signed)
Reason for Consult: Protein calorie malnutrition, status post Roux-en-Y gastric bypass Referring Physician: Dossie Arbour  [name] is an 64 y.o. female.  HPI: [name] is well known to Dr. Johnathan Hausen from my practice. She is status post Roux-en-Y gastric bypass in 2009. She has had problems with protein calorie malnutrition. She had a jejunostomy placed to assist with this in February of this year but she claims it fell out. She is being admitted by the family practice teaching service for failure to thrive and protein calorie malnutrition. She also has a history of opioid use and chronic pain. She denies abdominal pain at this time. She claims she has been eating only small amounts. She claims if she eats too much, she gets distended and constipated.  Past Medical History  Diagnosis Date  . Peptic ulcer disease     dr Oletta Lamas  . Acute duodenal ulcer with hemorrhage and perforation, with obstruction ([place])   . Barrett's esophagus   . Menopause   . GERD (gastroesophageal reflux disease)   . Asthma   . Chronic abdominal pain     narcotic dependence, dr gyarteng-dak at [facility]  . Narcotic abuse   . Anxiety     sees [name] NP at Dr. Radonna Ricker office  . Fx two ribs-open 08-24-11    left 4th and 5th  . Fx of fibula 07-28-11    left fibula, 2 places   . COPD (chronic obstructive pulmonary disease) ([place])   . Allergy   . Anemia   . History of blood transfusion     "related to OR; maybe when I perforated that ulcer"  . Lap Nissen + truncal vagotomy July 2008 05/13/2013  . LEG EDEMA 01/19/2008    Qualifier: Diagnosis of  By: [name] MD, [name]   . ACUTE DUODEN ULCER W/HEMORR PERF&OBSTRUCTION 06/26/2004    Qualifier: Diagnosis of  By: [name] MD, [name]   . FEVER, RECURRENT 08/11/2009    Qualifier: Diagnosis of  By: [name] MD, [name] MENOPAUSE 01/07/2007    Qualifier: Diagnosis of  By: [name], CMA, [place]    . Gastroparesis 06/03/2007    Qualifier: Diagnosis of  By: [name] MD,  [name]   . GASTRIC OUTLET OBSTRUCTION 08/28/2007    In July 2008 she underwent laparoscopic enterolysis, Nissen fundoplication over a #39 bougie, single pledgeted suture colosure of the hiatus and a 3 suture wrap.  Lap truncal vagotomy and a loop gastrojejunostomy was performed.  This was revised in August of 2009 to a roux en Y gastrojejujostomy.     . 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 Depression   . History of hiatal hernia   . Complication of anesthesia     pt states had too much xanax on board - agitated   . Anginal pain ([place])     "many many years ago"  . Exertional shortness of breath   . Pneumonia, organism unspecified 11/07/2010    Assoc with R Parapneumonic effusion 09/2010    - Tapped 10/05/10    - CxR resolved 11/07/2010    . Headache     "monthly" (09/06/2014)  . Arthritis     "hips" (09/06/2014)  . Myocardial infarction [facility])     Past Surgical History  Procedure Laterality Date  . Esophagogastroduodenoscopy  12-29-09    dr qadeer at [facility], several gastric ulcers  . Laparoscopic nissen fundoplication    . Roux-en-y gastric bypass  02-20-08  dr Hassell Done  . Repair of perforated ulcer    . Biopsy thyroid  08-13-11    benign nodule, per Dr. Melida Quitter   . Esophagogastroduodenoscopy N/A 09/25/2012    Procedure: ESOPHAGOGASTRODUODENOSCOPY (EGD);  Surgeon: Beryle Beams, MD;  Location: [facility];  Service: Endoscopy;  Laterality: N/A;  . Fracture surgery    . Tonsillectomy    . Cataract extraction w/ intraocular lens  implant, bilateral Bilateral   . Laparoscopic partial gastrectomy N/A 06/29/2013    Procedure: Gastrectomy and GJ Tube placement;  Surgeon: Pedro Earls, MD;  Location: [facility];  Service: General;  Laterality: N/A;  . Colonoscopy    . Gastrostomy N/A 07/26/2014    Procedure: LAPRASCOPIC ASSISTED OPEN PLACEMENT OF JEJUNOSTOMY TUBE;  Surgeon: Pedro Earls, MD;  Location: [facility];  Service: General;  Laterality: N/A;  . Orif hip  fracture Bilateral     "pins in both of them"  . Dilation and curettage of uterus    . Hernia repair      "hiatal"  . Joint replacement      Family History  Problem Relation Age of Onset  . Cancer Mother     kidney, magliant tumor on face  . Stroke Mother   . Celiac disease Father   . Cancer Father     kidney and pancreatic    Social History:  reports that she has been smoking Cigarettes.  She has a 100 pack-year smoking history. She has never used smokeless tobacco. She reports that she does not drink alcohol or use illicit drugs.  Allergies:  Allergies  Allergen Reactions  . Lithium Nausea And Vomiting  . Celebrex [Celecoxib] Other (See Comments)    History of bleeding ulcers  . Morphine Itching  . Quetiapine Other (See Comments)     tardive dyskinesia  . Varenicline Tartrate Other (See Comments)    hallucinations    Medications:  Prior to Admission:  Prescriptions prior to admission  Medication Sig Dispense Refill Last Dose  . ALPRAZolam (XANAX) 1 MG tablet Take 1 tablet (1 mg total) by mouth 3 (three) times daily as needed for anxiety. 42 tablet 0 04/08/2015 at Unknown time  . aspirin EC 81 MG tablet Take 81 mg by mouth daily.   04/08/2015 at Unknown time  . diazepam (VALIUM) 5 MG tablet Take 5 mg by mouth every 12 (twelve) hours as needed.  0 04/08/2015 at Unknown time  . furosemide (LASIX) 20 MG tablet Take 1 tablet (20 mg total) by mouth 2 (two) times daily. Take 1 with breakfast and 1 with lunch as needed for leg swelling 60 tablet 0 Past Month at Unknown time  . HYDROcodone-acetaminophen (NORCO) 10-325 MG tablet Take 1-2 tablets by mouth every 6 (six) hours as needed for moderate pain. 20 tablet 0 04/07/2015 at Unknown time  . mirtazapine (REMERON) 15 MG tablet Take 1 tablet (15 mg total) by mouth at bedtime.   04/07/2015 at Unknown time  . cephALEXin (KEFLEX) 500 MG capsule Take 1 capsule (500 mg total) by mouth 2 (two) times daily. 14 capsule 0 Unknown at Unknown  time  . DULoxetine (CYMBALTA) 30 MG capsule Take 1 capsule (30 mg total) by mouth daily. 30 capsule 0 Unknown at Unknown time  . ergocalciferol (VITAMIN D2) 50000 UNITS capsule Take 1 capsule (50,000 Units total) by mouth once a week. 24 capsule 0 Unknown at Unknown time  . feeding supplement (BOOST / RESOURCE BREEZE) LIQD Take 1 Container by mouth 3 (  three) times daily between meals. 90 Container 11 Unknown at Unknown time  . Multiple Vitamin (MULTIVITAMIN WITH MINERALS) TABS tablet Take 1 tablet by mouth daily.   Unknown at Unknown time  . Multiple Vitamins-Minerals (OCUVITE PO) Take 1 tablet by mouth daily.   Unknown at Unknown time  . OLANZapine (ZYPREXA) 7.5 MG tablet Take 1 tablet (7.5 mg total) by mouth at bedtime. 30 tablet 1 Unknown at Unknown time  . ondansetron (ZOFRAN ODT) 4 MG disintegrating tablet Take 1-2 tablets (4-8 mg total) by mouth every 8 (eight) hours as needed for nausea or vomiting. 180 tablet 0 Unknown at Unknown time  . pantoprazole (PROTONIX) 40 MG tablet Take 1 tablet (40 mg total) by mouth 2 (two) times daily before a meal. 180 tablet 3 Unknown at Unknown time  . potassium chloride SA (K-DUR,KLOR-CON) 20 MEQ tablet Take 1 tablet (20 mEq total) by mouth daily. 30 tablet 0 Unknown at Unknown time    No results found for this or any previous visit (from the past 48 hour(s)).  No results found.  Review of Systems  Constitutional: Negative for fever and chills.  HENT: Negative.   Eyes: Negative.   Respiratory: Negative for cough, shortness of breath and wheezing.   Cardiovascular: Negative for chest pain.  Gastrointestinal: Negative for nausea, vomiting and abdominal pain.       See history of present illness regarding early satiety  Genitourinary: Negative.   Musculoskeletal: Negative.   Skin:       Chronic discoloration of abdominal wall from heating pad  Neurological: Negative.   Endo/Heme/Allergies: Negative.   Psychiatric/Behavioral: The patient is  nervous/anxious.    Blood pressure 94/59, pulse 74, temperature 98.1 F (36.7 C), temperature source Oral, resp. rate 18, height 5\' 3"  (1.6 m), weight 49.034 kg (108 lb 1.6 oz), SpO2 100 %. Physical Exam  Constitutional: She is oriented to person, place, and time. She appears well-developed.  Thin  HENT:  Head: Normocephalic and atraumatic.  Right Ear: External ear normal.  Left Ear: External ear normal.  Nose: Nose normal.  Mouth/Throat: Oropharynx is clear and moist.  Eyes: EOM are normal. Pupils are equal, round, and reactive to light.  Neck: Neck supple. No tracheal deviation present.  Cardiovascular: Normal rate and normal heart sounds.   Mild peripheral edema bilateral lower extremities  Respiratory: Effort normal. No stridor. No respiratory distress. She has no wheezes.  GI: Soft. She exhibits no distension. There is no tenderness. There is no rebound and no guarding.  Healed midline scar  Musculoskeletal: Normal range of motion.  Neurological: She is alert and oriented to person, place, and time. She exhibits normal muscle tone.  Psychiatric:  Anxious    Assessment/Plan: Protein calorie malnutrition S/P Roux-en-Y gastric bypass  She has had an enteral feeding tube placed previously which came out at home. Additionally, she claims she had a PICC line and TNA at home previously which helped her gain strength. At this time I recommend checking prealbumin. I ordered her a soft diet. We will follow and determine the next good steps to address her malnutrition.  Jayliah Benett E 04/08/2015, 4:42 PM
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
## 2015-04-09 ENCOUNTER — Inpatient Hospital Stay (HOSPITAL_COMMUNITY): Payer: Medicare Other

## 2015-04-09 ENCOUNTER — Encounter (HOSPITAL_COMMUNITY): Payer: Self-pay | Admitting: Surgery

## 2015-04-09 DIAGNOSIS — G8929 Other chronic pain: Secondary | ICD-10-CM

## 2015-04-09 DIAGNOSIS — R109 Unspecified abdominal pain: Secondary | ICD-10-CM

## 2015-04-09 DIAGNOSIS — F332 Major depressive disorder, recurrent severe without psychotic features: Secondary | ICD-10-CM

## 2015-04-09 DIAGNOSIS — E43 Unspecified severe protein-calorie malnutrition: Secondary | ICD-10-CM

## 2015-04-09 DIAGNOSIS — F411 Generalized anxiety disorder: Secondary | ICD-10-CM

## 2015-04-09 DIAGNOSIS — R0902 Hypoxemia: Principal | ICD-10-CM

## 2015-04-09 DIAGNOSIS — F1193 Opioid use, unspecified with withdrawal: Secondary | ICD-10-CM

## 2015-04-09 LAB — BASIC METABOLIC PANEL
ANION GAP: 8 (ref 5–15)
BUN: 16 mg/dL (ref 6–20)
CO2: 29 mmol/L (ref 22–32)
Calcium: 7.9 mg/dL — ABNORMAL LOW (ref 8.9–10.3)
Chloride: 107 mmol/L (ref 101–111)
Creatinine, Ser: 0.68 mg/dL (ref 0.44–1.00)
GFR calc Af Amer: >60 mL/min (ref >60)
Glucose, Bld: 86 mg/dL (ref 65–99)
POTASSIUM: 3.3 mmol/L — AB (ref 3.5–5.1)
SODIUM: 144 mmol/L (ref 135–145)

## 2015-04-09 LAB — RAPID URINE DRUG SCREEN, HOSP PERFORMED
AMPHETAMINES: NOT DETECTED
Barbiturates: NOT DETECTED
Benzodiazepines: POSITIVE — AB
Cocaine: NOT DETECTED
OPIATES: POSITIVE — AB
Tetrahydrocannabinol: NOT DETECTED

## 2015-04-09 LAB — HEPATITIS C ANTIBODY (REFLEX)

## 2015-04-09 LAB — HCV COMMENT:

## 2015-04-09 MED ORDER — DULOXETINE HCL 60 MG PO CPEP: 60.0000 mg | ORAL_CAPSULE | Freq: Every day | ORAL | Status: DC

## 2015-04-09 MED ORDER — POLYETHYLENE GLYCOL 3350 17 G PO PACK
17.0000 g | PACK | Freq: Two times a day (BID) | ORAL | Status: DC
Start: 2015-04-09 — End: 2015-04-09
  Administered 2015-04-09: 17 g via ORAL
  Filled 2015-04-09: qty 1

## 2015-04-09 MED ORDER — BISACODYL 10 MG RE SUPP: 10.0000 mg | Freq: Two times a day (BID) | RECTAL | Status: DC | PRN

## 2015-04-09 MED ORDER — SACCHAROMYCES BOULARDII 250 MG PO CAPS
250.0000 mg | ORAL_CAPSULE | Freq: Two times a day (BID) | ORAL | Status: DC
  Administered 2015-04-09: 250 mg via ORAL
  Filled 2015-04-09: qty 1

## 2015-04-09 NOTE — Discharge Summary (Signed)
[facility] Discharge Summary  Patient name: [name] Medical record number: [id number] Date of birth: 1950-10-27 Age: 64 y.o. Gender: female Date of Admission: 04/08/2015  Date of Discharge: 04/09/2015  Admitting Physician: Lupita Dawn, MD  Primary Care Provider: [name], MD Consultants: Psychiatry, General surgery  Indication for Hospitalization:  Dyspnea, Malnutrition  Discharge Diagnoses/Problem List:  Patient Active Problem List   Diagnosis Date Noted  . Hypoxia 04/08/2015  . Opioid use with withdrawal ([place])   . Protein-calorie malnutrition ([place])   . Episode of recurrent major depressive disorder ([place])   . Secondary hyperparathyroidism ([place]) 01/10/2015  . Vitamin D deficiency 01/10/2015  . 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 Anemia, iron deficiency 11/03/2014  . Chronic pain syndrome 10/24/2014  . Osteoporosis 10/05/2014  . Vertebral compression fracture ([place]) 10/05/2014  . Coronary artery disease due to calcified coronary lesion 10/05/2014  . Weight loss   . Tobacco abuse   . History of subtotal gastrectomy with Roux-en-Y recontruction   . Orthostatic hypotension 08/11/2014  . Severe protein-calorie malnutrition ([place]) 12/30/2013  . Constipation, chronic 07/10/2013  . Chronic abdominal pain   . COPD (chronic obstructive pulmonary disease) ([place]) 11/07/2010  . Major depressive disorder, recurrent episode, moderate ([place]) 06/06/2010  . 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 Anxiety state 01/07/2007  . BARRETT'S ESOPHAGUS, HX OF 07/03/2005     Disposition: Home  Discharge Condition: Stable  Discharge Exam:   Objective: Temp: [97.4 F (36.3 C)-98.1 F (36.7 C)] 97.9 F (36.6 C) (10/16 0534) Pulse Rate: [65-77] 65 (10/16 0534) Resp: [18] 18 (10/16 0534) BP: (88-100)/(53-60) 95/53 mmHg (10/16 0534) SpO2: [84 %-100 %] 93 % (10/16 0534) Weight: [108 lb 1.6 oz (49.034 kg)-110 lb 3.7 oz (50 kg)] 110 lb 3.7 oz (50  kg) (10/16 0534) Physical Exam: General: NAD, sitting up in bed Cardiovascular: RRR, no murmurs auscultated Respiratory: CTAB, decreased breath sounds at the bases, no wheezes  Abdomen: soft, non tender, + BS Extremities: trace b/l LE edema, approx 1-2 cm diameter area of excoriation on right lower calf, healing  Brief Hospital Course:  [name] is a 64 y.o. female presented as a direct admit from [place] with dyspnea. PMH is significant for chronic pain syndrome, depression, anxiety, gastroparesis, CAD, COPD, severe protein-calorie malnutrition, s/p roux-en-y gastric bypass.   On presentation pt was noted to have stable O2 sats on room air however, she was noted to be agitated and reported some suicidal ideation over not having enough pain medication. Her pain is managed as an outpatient by her PCP. On admission she was placed on suicide precautions and placed on 24 hour observation with a sitter in the room. Psychiatry was consulted. By the next day she denies SI or ever really having any intention of harming her self or others. Psychiatry did see her and confirmed she was no harm to herself and should maintain her home anti- anxiety/antideppressants.  She was also noted to be malnourished on presentation with an albumin of 2. Her Prealmumin however was normal at 20.4. General surgery was consulted for recommendations for improving her nutrition given her history of roux-en-y gastric bypass. At this time it was felt that she would benefit from nutrition counseling which could be performed in the outpatient setting On day of discharge she was noted to be stable for discharge to home with close follow up with her PCP for nutrition counseling and pain management   Issues for Follow Up:  1. Further education on  Post Gastrectomy nutrition  Significant Procedures: none  Significant Labs and Imaging:   Recent Labs Lab 04/05/15 1043 04/08/15 1913  WBC 6.3 6.5  HGB 10.3* 8.6*  HCT 31.8*  27.2*  PLT  --  348    Recent Labs Lab 04/05/15 1032 04/08/15 1913 04/09/15 0515  NA 143 142 144  K 4.3 3.6 3.3*  CL 106 107 107  CO2 30 27 29   GLUCOSE 75 95 86  BUN 15 22* 16  CREATININE 0.90 0.88 0.68  CALCIUM 7.5* 8.0* 7.9*  MG  --  2.0  --   PHOS  --  3.6  --   ALKPHOS 93 72  --   AST 18 25  --   ALT 15 16  --   ALBUMIN 2.3* 2.0*  --       Results/Tests Pending at Time of Discharge: None Discharge Medications:    Medication List    STOP taking these medications        mirtazapine 15 MG tablet  Commonly known as:  REMERON     mirtazapine 30 MG tablet  Commonly known as:  REMERON      TAKE these medications        ALPRAZolam 1 MG tablet  Commonly known as:  XANAX  Take 1 tablet (1 mg total) by mouth 3 (three) times daily as needed for anxiety.     cephALEXin 500 MG capsule  Commonly known as:  KEFLEX  Take 1 capsule (500 mg total) by mouth 2 (two) times daily.     diazepam 5 MG tablet  Commonly known as:  VALIUM  Take 5 mg by mouth every 12 (twelve) hours as needed for anxiety.     DULoxetine 30 MG capsule  Commonly known as:  CYMBALTA  Take 1 capsule (30 mg total) by mouth daily.     ergocalciferol 50000 UNITS capsule  Commonly known as:  VITAMIN D2  Take 1 capsule (50,000 Units total) by mouth once a week.     feeding supplement Liqd  Take 1 Container by mouth 3 (three) times daily between meals.     furosemide 20 MG tablet  Commonly known as:  LASIX  Take 1 tablet (20 mg total) by mouth 2 (two) times daily. Take 1 with breakfast and 1 with lunch as needed for leg swelling     GOODY HEADACHE PO  Take 1 packet by mouth 4 (four) times daily as needed (pain).     HYDROcodone-acetaminophen 10-325 MG tablet  Commonly known as:  NORCO  Take 1-2 tablets by mouth every 6 (six) hours as needed for moderate pain.     OLANZapine 7.5 MG tablet  Commonly known as:  ZYPREXA  Take 1 tablet (7.5 mg total) by mouth at bedtime.     ondansetron 4 MG  disintegrating tablet  Commonly known as:  ZOFRAN ODT  Take 1-2 tablets (4-8 mg total) by mouth every 8 (eight) hours as needed for nausea or vomiting.     pantoprazole 40 MG tablet  Commonly known as:  PROTONIX  Take 1 tablet (40 mg total) by mouth 2 (two) times daily before a meal.     potassium chloride SA 20 MEQ tablet  Commonly known as:  K-DUR,KLOR-CON  Take 1 tablet (20 mEq total) by mouth daily.        Discharge Instructions: Please refer to Patient Instructions section of EMR for full details.  Patient was counseled important signs and symptoms that should prompt return  to medical care, changes in medications, dietary instructions, activity restrictions, and follow up appointments.   Follow-Up Appointments: Follow-up Information    Schedule an appointment as soon as possible for a visit with [name], MD.   Specialty:  Family Medicine   Contact information:   [place] [address] [phone]       Veatrice Bourbon, MD 04/09/2015, 7:10 PM PGY-2, [place]

## 2015-04-09 NOTE — Progress Notes (Signed)
PT Cancellation Note  Patient Details Name: [name] MRN: [id number] DOB: [birth year]   Cancelled Treatment:    Reason Eval/Treat Not Completed: Patient at procedure or test/unavailable. Pt declining participation in PT eval, stating she was comfortable in bed and didn't want to get up. With encouragement, pt agreeable. However, the psychiatrist arrived, suspending PT eval. Pt very anxious to begin speaking with the psychiatrist. PT to follow up tomorrow.   Lorriane Shire 04/09/2015, 3:33 PM

## 2015-04-09 NOTE — Consult Note (Signed)
[facility] Face-to-Face Psychiatry Consult   Reason for Consult: depression and anxiety Referring Physician:  Dr. Juanito Doom Patient Identification: [name] MRN:  [id number] Principal Diagnosis: Episode of recurrent major depressive disorder [facility]) Diagnosis:   Patient Active Problem List   Diagnosis Date Noted  . Episode of recurrent major depressive disorder ([place]) [F33.9]     Priority: High  . Anxiety state [F41.1] 01/07/2007    Priority: High  . Hypoxia [R09.02] 04/08/2015  . Opioid use with withdrawal ([place]) [F11.93]   . Protein-calorie malnutrition ([place]) [E46]   . Secondary hyperparathyroidism ([place]) [N25.81] 01/10/2015  . Vitamin D deficiency [E55.9] 01/10/2015  . HPV (human papilloma virus) infection [B97.7] 11/19/2014  . Anemia, iron deficiency [D50.9] 11/03/2014  . Chronic pain syndrome [G89.4] 10/24/2014  . Osteoporosis [M81.0] 10/05/2014  . Vertebral compression fracture ([place]) [M48.50XA] 10/05/2014  . Coronary artery disease due to calcified coronary lesion [I25.10, I25.84] 10/05/2014  . Weight loss [R63.4]   . Tobacco abuse [Z72.0]   . History of subtotal gastrectomy with Roux-en-Y recontruction [Z90.3]   . Orthostatic hypotension [I95.1] 08/11/2014  . Severe protein-calorie malnutrition ([place]) [E43] 12/30/2013  . Constipation, chronic [K59.00] 07/10/2013  . Chronic abdominal pain [R10.9, G89.29]   . COPD (chronic obstructive pulmonary disease) ([place]) [J44.9] 11/07/2010  . Major depressive disorder, recurrent episode, moderate ([place]) [F33.1] 06/06/2010  . TARDIVE DYSKINESIA [G24.01] 11/17/2009  . BARRETT'S ESOPHAGUS, HX OF [Z87.19] 07/03/2005    Total Time spent with patient: 1 hour  Subjective:   [name] is a 64 y.o. female patient admitted with acute episode of hypoxia  HPI: Thank you for asking me to a psychiatric consult on [name], 64 year old female with past medical history of chronic pain syndrome, depression, anxiety, gastroparesis, CAD, COPD, severe  protein calorie malnutrition, and history of Roux-en-Y gastric bypass directly admitted due to acute episode of hypoxia. Patient reports being overwhelmed and stressed out due to multiple medical problem. She verbalizes increased depression, feeling hopeless, helpless associated with poor appetite, weight loss, low energy level, anhedonia and anxiety. Patient also reports that her aged mother was recently had stroke and has become apprehensive and worry about her health. She reports a trial of Prozac and Remeron prescribed by her PCP with no improvement of her symptoms. Patient denies suicidal/homicidal thoughts, psychosis or delusional thinking. She also denies drugs and alcohol abuse.  Past Psychiatric History: MDD, Anxiety. History of inpatient admission to [place] many years ago.  Risk to Self:   Risk to Others:   Prior Inpatient Therapy:   Prior Outpatient Therapy:    Past Medical History:  Past Medical History  Diagnosis Date  . Peptic ulcer disease     dr Oletta Lamas  . Acute duodenal ulcer with hemorrhage and perforation, with obstruction ([place])   . Barrett's esophagus   . Menopause   . GERD (gastroesophageal reflux disease)   . Asthma   . Chronic abdominal pain     narcotic dependence, dr gyarteng-dak at [facility]  . Narcotic abuse   . Anxiety     sees [name] NP at Dr. Radonna Ricker office  . Fx two ribs-open 08-24-11    left 4th and 5th  . Fx of fibula 07-28-11    left fibula, 2 places   . COPD (chronic obstructive pulmonary disease) ([place])   . Allergy   . Anemia   . History of blood transfusion     "related to OR; maybe when I perforated that ulcer"  . Lap Nissen +  truncal vagotomy July 2008 05/13/2013  . LEG EDEMA 01/19/2008    Qualifier: Diagnosis of  By: [name] MD, [name]   . ACUTE DUODEN ULCER W/HEMORR PERF&OBSTRUCTION 06/26/2004    Qualifier: Diagnosis of  By: [name] MD, [name]   . FEVER, RECURRENT 08/11/2009    Qualifier: Diagnosis of  By: [name] MD,  [name] MENOPAUSE 01/07/2007    Qualifier: Diagnosis of  By: [name], CMA, [place]    . Gastroparesis 06/03/2007    Qualifier: Diagnosis of  By: [name] MD, [name]   . GASTRIC OUTLET OBSTRUCTION 08/28/2007    In July 2008 she underwent laparoscopic enterolysis, Nissen fundoplication over a #97 bougie, single pledgeted suture colosure of the hiatus and a 3 suture wrap.  Lap truncal vagotomy and a loop gastrojejunostomy was performed.  This was revised in August of 2009 to a roux en Y gastrojejujostomy.     . 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 Depression   . History of hiatal hernia   . Complication of anesthesia     pt states had too much xanax on board - agitated   . Anginal pain ([place])     "many many years ago"  . Exertional shortness of breath   . Pneumonia, organism unspecified 11/07/2010    Assoc with R Parapneumonic effusion 09/2010    - Tapped 10/05/10    - CxR resolved 11/07/2010    . Headache     "monthly" (09/06/2014)  . Arthritis     "hips" (09/06/2014)  . Myocardial infarction ([place])   . S/P jejunostomy Feb 2016 07/26/2014    Past Surgical History  Procedure Laterality Date  . Esophagogastroduodenoscopy  12-29-09    dr qadeer at [facility], several gastric ulcers  . Laparoscopic nissen fundoplication    . Gastrojejunostomy  02-20-08    Roux en Y gastrojejunsotomy w 1 foot Roux limb  . Repair of perforated ulcer    . Biopsy thyroid  08-13-11    benign nodule, per Dr. Melida Quitter   . Esophagogastroduodenoscopy N/A 09/25/2012    Procedure: ESOPHAGOGASTRODUODENOSCOPY (EGD);  Surgeon: Beryle Beams, MD;  Location: [facility];  Service: Endoscopy;  Laterality: N/A;  . Fracture surgery    . Tonsillectomy    . Cataract extraction w/ intraocular lens  implant, bilateral Bilateral   . Laparoscopic partial gastrectomy N/A 06/29/2013    Procedure: Gastrectomy and GJ Tube placement;  Surgeon: Pedro Earls, MD;  Location: [facility];  Service: General;  Laterality:  N/A;  . Colonoscopy    . Gastrostomy N/A 07/26/2014    Procedure: LAPRASCOPIC ASSISTED OPEN PLACEMENT OF JEJUNOSTOMY TUBE;  Surgeon: Pedro Earls, MD;  Location: [facility];  Service: General;  Laterality: N/A;  . Orif hip fracture Bilateral     "pins in both of them"  . Dilation and curettage of uterus    . Hernia repair      "hiatal"  . Joint replacement     Family History:  Family History  Problem Relation Age of Onset  . Cancer Mother     kidney, magliant tumor on face  . Stroke Mother   . Celiac disease Father   . Cancer Father     kidney and pancreatic   Family Psychiatric  History: Denies Social History:  History  Alcohol Use No     History  Drug Use No    Social History   Social History  . Marital Status: Divorced  Spouse Name: N/A  . Number of Children: N/A  . Years of Education: N/A   Social History Main Topics  . Smoking status: Current Every Day Smoker -- 2.00 packs/day for 50 years    Types: Cigarettes  . Smokeless tobacco: Never Used  . Alcohol Use: No  . Drug Use: No  . Sexual Activity: No   Other Topics Concern  . Not on file   Social History Narrative   Additional Social History:                          Allergies:   Allergies  Allergen Reactions  . Lithium Nausea And Vomiting  . Celebrex [Celecoxib] Other (See Comments)    History of bleeding ulcers  . Morphine Itching  . Quetiapine Other (See Comments)     tardive dyskinesia  . Varenicline Tartrate Other (See Comments)    hallucinations    Labs:  Results for orders placed or performed during the hospital encounter of 04/08/15 (from the past 48 hour(s))  Comprehensive metabolic panel     Status: Abnormal   Collection Time: 04/08/15  7:13 PM  Result Value Ref Range   Sodium 142 135 - 145 mmol/L   Potassium 3.6 3.5 - 5.1 mmol/L   Chloride 107 101 - 111 mmol/L   CO2 27 22 - 32 mmol/L   Glucose, Bld 95 65 - 99 mg/dL   BUN 22 (H) 6 - 20 mg/dL   Creatinine, Ser 0.88  0.44 - 1.00 mg/dL   Calcium 8.0 (L) 8.9 - 10.3 mg/dL   Total Protein 4.3 (L) 6.5 - 8.1 g/dL   Albumin 2.0 (L) 3.5 - 5.0 g/dL   AST 25 15 - 41 U/L   ALT 16 14 - 54 U/L   Alkaline Phosphatase 72 38 - 126 U/L   Total Bilirubin 0.5 0.3 - 1.2 mg/dL   GFR calc non Af Amer >60 >60 mL/min   GFR calc Af Amer >60 >60 mL/min    Comment: (NOTE) The eGFR has been calculated using the CKD EPI equation. This calculation has not been validated in all clinical situations. eGFR's persistently <60 mL/min signify possible Chronic Kidney Disease.    Anion gap 8 5 - 15  Magnesium     Status: None   Collection Time: 04/08/15  7:13 PM  Result Value Ref Range   Magnesium 2.0 1.7 - 2.4 mg/dL  Phosphorus     Status: None   Collection Time: 04/08/15  7:13 PM  Result Value Ref Range   Phosphorus 3.6 2.5 - 4.6 mg/dL  CBC WITH DIFFERENTIAL     Status: Abnormal   Collection Time: 04/08/15  7:13 PM  Result Value Ref Range   WBC 6.5 4.0 - 10.5 K/uL   RBC 2.77 (L) 3.87 - 5.11 MIL/uL   Hemoglobin 8.6 (L) 12.0 - 15.0 g/dL   HCT 27.2 (L) 36.0 - 46.0 %   MCV 98.2 78.0 - 100.0 fL   MCH 31.0 26.0 - 34.0 pg   MCHC 31.6 30.0 - 36.0 g/dL   RDW 16.9 (H) 11.5 - 15.5 %   Platelets 348 150 - 400 K/uL   Neutrophils Relative % 65 %   Neutro Abs 4.2 1.7 - 7.7 K/uL   Lymphocytes Relative 21 %   Lymphs Abs 1.4 0.7 - 4.0 K/uL   Monocytes Relative 10 %   Monocytes Absolute 0.6 0.1 - 1.0 K/uL   Eosinophils Relative 3 %  Eosinophils Absolute 0.2 0.0 - 0.7 K/uL   Basophils Relative 1 %   Basophils Absolute 0.1 0.0 - 0.1 K/uL  Hepatitis c antibody (reflex)     Status: None   Collection Time: 04/08/15  7:13 PM  Result Value Ref Range   HCV Ab <0.1 0.0 - 0.9 s/co ratio    Comment: (NOTE) Performed At: [facility] [place], [place] [id number] Lindon Romp MD [id number]   Prealbumin     Status: None   Collection Time: 04/08/15  7:13 PM  Result Value Ref Range   Prealbumin 20.4 18 - 38  mg/dL  HCV Comment:     Status: None   Collection Time: 04/08/15  7:13 PM  Result Value Ref Range   Comment: Comment     Comment: (NOTE) Non reactive HCV antibody screen is consistent with no HCV infection, unless recent infection is suspected or other evidence exists to indicate HCV infection. Performed At: [facility] [place], [place] [id number] Lindon Romp MD [id number]   Urine rapid drug screen (hosp performed)     Status: Abnormal   Collection Time: 04/08/15 11:50 PM  Result Value Ref Range   Opiates POSITIVE (A) NONE DETECTED   Cocaine NONE DETECTED NONE DETECTED   Benzodiazepines POSITIVE (A) NONE DETECTED   Amphetamines NONE DETECTED NONE DETECTED   Tetrahydrocannabinol NONE DETECTED NONE DETECTED   Barbiturates NONE DETECTED NONE DETECTED    Comment:        DRUG SCREEN FOR MEDICAL PURPOSES ONLY.  IF CONFIRMATION IS NEEDED FOR ANY PURPOSE, NOTIFY LAB WITHIN 5 DAYS.        LOWEST DETECTABLE LIMITS FOR URINE DRUG SCREEN Drug Class       Cutoff (ng/mL) Amphetamine      1000 Barbiturate      200 Benzodiazepine   094 Tricyclics       076 Opiates          300 Cocaine          300 THC              50   Basic metabolic panel     Status: Abnormal   Collection Time: 04/09/15  5:15 AM  Result Value Ref Range   Sodium 144 135 - 145 mmol/L   Potassium 3.3 (L) 3.5 - 5.1 mmol/L   Chloride 107 101 - 111 mmol/L   CO2 29 22 - 32 mmol/L   Glucose, Bld 86 65 - 99 mg/dL   BUN 16 6 - 20 mg/dL   Creatinine, Ser 0.68 0.44 - 1.00 mg/dL   Calcium 7.9 (L) 8.9 - 10.3 mg/dL   GFR calc non Af Amer >60 >60 mL/min   GFR calc Af Amer >60 >60 mL/min    Comment: (NOTE) The eGFR has been calculated using the CKD EPI equation. This calculation has not been validated in all clinical situations. eGFR's persistently <60 mL/min signify possible Chronic Kidney Disease.    Anion gap 8 5 - 15    Current Facility-Administered Medications  Medication Dose Route  Frequency Provider Last Rate Last Dose  . 0.9 %  sodium chloride infusion   Intravenous Continuous Archie Patten, MD 50 mL/hr at 04/08/15 2211    . ALPRAZolam [name]) tablet 1 mg  1 mg Oral TID PRN Archie Patten, MD   1 mg at 04/09/15 0805  . aspirin EC tablet 81 mg  81 mg Oral Daily Archie Patten, MD  81 mg at 04/09/15 1051  . bisacodyl (DULCOLAX) suppository 10 mg  10 mg Rectal Q12H PRN Michael Boston, MD      . cephALEXin (KEFLEX) capsule 500 mg  500 mg Oral BID Archie Patten, MD   500 mg at 04/09/15 1051  . [START ON 04/10/2015] DULoxetine (CYMBALTA) DR capsule 60 mg  60 mg Oral Daily Michael Boston, MD      . enoxaparin (LOVENOX) injection 40 mg  40 mg Subcutaneous Q24H Archie Patten, MD   40 mg at 04/08/15 1839  . [START ON 04/12/2015] ergocalciferol (VITAMIN D2) capsule 50,000 Units  50,000 Units Oral Q Wed Crystal Libby Maw, MD      . feeding supplement (BOOST / RESOURCE BREEZE) liquid 1 Container  1 Container Oral TID BM Archie Patten, MD   1 Container at 04/09/15 1436  . HYDROcodone-acetaminophen (NORCO) 10-325 MG per tablet 1-2 tablet  1-2 tablet Oral Q6H PRN Archie Patten, MD   2 tablet at 04/09/15 1435  . ipratropium-albuterol (DUONEB) 0.5-2.5 (3) MG/3ML nebulizer solution 3 mL  3 mL Nebulization Q4H PRN Archie Patten, MD      . multivitamin with minerals tablet 1 tablet  1 tablet Oral Daily Archie Patten, MD   1 tablet at 04/09/15 1051  . nicotine (NICODERM CQ - dosed in mg/24 hours) patch 21 mg  21 mg Transdermal Daily Archie Patten, MD   21 mg at 04/09/15 1051  . OLANZapine (ZYPREXA) tablet 7.5 mg  7.5 mg Oral QHS Cheray Pardi   7.5 mg at 04/08/15 2300  . ondansetron (ZOFRAN-ODT) disintegrating tablet 4-8 mg  4-8 mg Oral Q8H PRN Archie Patten, MD      . polyethylene glycol (MIRALAX / GLYCOLAX) packet 17 g  17 g Oral Daily PRN Archie Patten, MD      . polyethylene glycol (MIRALAX / GLYCOLAX) packet 17 g  17 g Oral BID Michael Boston, MD   17 g at  04/09/15 1245  . saccharomyces boulardii (FLORASTOR) capsule 250 mg  250 mg Oral BID Michael Boston, MD   250 mg at 04/09/15 1245    Musculoskeletal: Strength & Muscle Tone: within normal limits Gait & Station: Not tested Patient leans: N/A  Psychiatric Specialty Exam: Review of Systems  Constitutional: Negative.   HENT: Negative.   Eyes: Negative.   Respiratory: Negative for cough, hemoptysis and sputum production.   Cardiovascular: Negative for chest pain and palpitations.  Gastrointestinal: Negative.   Genitourinary: Negative.   Skin: Negative.   Neurological: Negative.   Endo/Heme/Allergies: Negative.   Psychiatric/Behavioral: Positive for depression. The patient is nervous/anxious.     Blood pressure 90/61, pulse 65, temperature 98.3 F (36.8 C), temperature source Oral, resp. rate 18, height '5\' 3"'  (1.6 m), weight 50 kg (110 lb 3.7 oz), SpO2 94 %.Body mass index is 19.53 kg/(m^2).  General Appearance: Casual  Eye Contact::  Good  Speech:  Normal Rate  Volume:  Normal  Mood:  Anxious and Depressed  Affect:  Constricted  Thought Process:  Goal Directed  Orientation:  Full (Time, Place, and Person)  Thought Content:  Negative  Suicidal Thoughts:  No  Homicidal Thoughts:  No  Memory:  Immediate;   Fair Recent;   Good Remote;   Good  Judgement:  Fair  Insight:  Fair  Psychomotor Activity:  Normal  Concentration:  Good  Recall:  Good  Fund of Knowledge:Good  Language: Good  Akathisia:  No  Handed:  Right  AIMS (if indicated):     Assets:  Communication Skills Desire for Improvement Financial Resources/Insurance  ADL's:  Intact  Cognition: WNL  Sleep:   fair   Treatment Plan Summary: Daily contact with patient to assess and evaluate symptoms and progress in treatment. Medication management: - Continue Cymbalta 71m daily for depression and Zyprexa 7.543mQhs for anxiety/depression. -Discontinue Remeron-ineffective in the past.  Disposition:  No evidence of  imminent risk to self or others at present.   Patient does not meet criteria for psychiatric inpatient admission. Supportive therapy provided about ongoing stressors. Refer patient to a psychiatrist/counselor for medication management and therapy upon discharge.  AkCorena PilgrimMD 04/09/2015 3:44 PM

## 2015-04-09 NOTE — Progress Notes (Signed)
Family Medicine Teaching Service Daily Progress Note Intern Pager: [phone]  Patient name: [name] Medical record number: [id number] Date of birth: [birth year] Age: 64 y.o. Gender: female  Primary Care Provider: [name], MD Consultants: General Surgery Code Status: Full  Pt Overview and Major Events to Date:  10:15: Admitted for dyspnea  Assessment and Plan:  [name] is a 64 y.o. female presenting as a direct admit from [place] with dyspnea. PMH is significant for chronic pain syndrome, depression, anxiety, gastroparesis, CAD, COPD, severe protein-calorie malnutrition, s/p roux-en-y gastric bypass.   Dyspnea and Hypoxia: Resolved. Likely secondary to aggitation over pain medications - Duonebs q4 PRN  - Continue home Xanax PRN   Protein Calorie Malnutrition: Prealbumin wnl, Albumin 2.0 - Nutrition consult  - Surgery following for consideration of TPN vs an enteral feeding tube (both of which the patient has had in the past)  - Boost feeding supplement  - Soft diet per Surgery  Diffuse Pain now with concerns for opioid withdrawal: Stable per pt, no sign of withdrawal at this time - Continue home NORCO 10-325 mg 1-2 tablet q 6 PRN - we dicussed that while uncomfortable, opioid withdrawal is not dangerous - apply heating pads to legs as needed for pain (avoiding the R LE ulceration). - consult social work given patient's prominent use of narcotics; per PCP patient has agreed to go see pain management however becomes agitated when I mention this stating "they won't do anything"  -  colace BID given significant narcotic use.   Psych (Depression & Anxiety): Pt reports stable anxiety depression. While she was very upset last night that she was almost out of her pain medication, and stated she wanted to harm herself she denies that she actually meant that. She denies SI/HI at this time or ever having it  -  Continue 1:1 sitter - Continue Xanax, Cymbalta, Remeron, and  Zyprexa. Holding Valium.  - D/c consult to psychiatry as pt does not seem to be a hard to herself  - Patient has her home medications in her belongs in the room, stressed the importance of not taking her home medications.   R LE ulcer/PVD: Stable with dressing over it - Continue Keflex; she has 6 capsules left therefore will give 3 more days  -Monitor fluid status (currently euvolemic), concerned that this LE swelling could be from hypoalbuminemia. No h/o echocardiograms but less likely cardiac in nature given no crackles of JVD on exam.   Tobacco Abuse: Used to smoke 2 PPD, she has apparently cut back and is trying electronic cigarettes.  -Nicotine patch 49mcg  - stress the importance of cessation given PVD and COPD  COPD: Stable, no evidence of exacerbation - DuoNebs PRN  GERD: Chronic. Takes Protonix at home - continue Protonix.   FEN/GI: NS @ 65mL/hr, soft diet with Colace from constipation Prophylaxis: Lovenox Disposition: Pending clinical improvement  Subjective:  Pt denies SOB, chest p[ain, reports pain over right leg wound  Objective: Temp:  [97.4 F (36.3 C)-98.1 F (36.7 C)] 97.9 F (36.6 C) (10/16 0534) Pulse Rate:  [65-77] 65 (10/16 0534) Resp:  [18] 18 (10/16 0534) BP: (88-100)/(53-60) 95/53 mmHg (10/16 0534) SpO2:  [84 %-100 %] 93 % (10/16 0534) Weight:  [108 lb 1.6 oz (49.034 kg)-110 lb 3.7 oz (50 kg)] 110 lb 3.7 oz (50 kg) (10/16 0534) Physical Exam: General: NAD, sitting up in bed Cardiovascular: RRR, no murmurs auscultated Respiratory: CTAB, decreased breath sounds at the bases, no wheezes  Abdomen: soft,  non tender, + BS Extremities: trace b/l LE edema, approx 1-2 cm diameter area of excoriation on right lower calf, healing   Laboratory:  Recent Labs Lab 04/05/15 1043 04/08/15 1913  WBC 6.3 6.5  HGB 10.3* 8.6*  HCT 31.8* 27.2*  PLT  --  348    Recent Labs Lab 04/05/15 1032 04/08/15 1913 04/09/15 0515  NA 143 142 144  K 4.3 3.6 3.3*   CL 106 107 107  CO2 30 27 29   BUN 15 22* 16  CREATININE 0.90 0.88 0.68  CALCIUM 7.5* 8.0* 7.9*  PROT 4.3* 4.3*  --   BILITOT 0.3 0.5  --   ALKPHOS 93 72  --   ALT 15 16  --   AST 18 25  --   GLUCOSE 75 95 86      Imaging/Diagnostic Tests:   Veatrice Bourbon, MD 04/09/2015, 8:15 AM PGY-2, [place] Intern pager: [phone], text pages welcome

## 2015-04-09 NOTE — Progress Notes (Signed)
[name]  March 12, 1951 [id number]  Patient Care Team: Shawnee Knapp, MD as PCP - General (Family Medicine) Johnathan Hausen, MD as Consulting Physician (General Surgery) Carol Ada, MD as Consulting Physician (Gastroenterology)  Patient with poor PO intake & h/o GERD, recurrent ulcers / strictures s/p many foregut operations, the last w subtotal gastrectomy w Roux en Y reconstruction.  Depression & anxiety.  FTT w poor PO intake & chronic constipation.  Pulled J tube out.  She has no short gut symdrome - most of SB & colon remain.  Would rec AXR r/o SBO.  CT enterrhography if bigger concerns noted (unlikely)  Bowel regimen.  Consider enema if not better  NEEDS PSYCHIATRIC ISSUES CONTROLLED to help her cope better  Skeptical another central line w TNA of much help.   She has no short gut symdrome - most of SB & colon remain.  Dr Hassell Done skeptical that another feeding tube will stay in but would be better than TNA  Try to reeducate on post gastrectomy diet w PO supplements.   Patient Active Problem List   Diagnosis Date Noted  . Severe protein-calorie malnutrition ([place]) 12/30/2013    Priority: High  . Hypoxia 04/08/2015  . Opioid use with withdrawal ([place])   . Protein-calorie malnutrition ([place])   . Episode of recurrent major depressive disorder ([place])   . Secondary hyperparathyroidism ([place]) 01/10/2015  . Vitamin D deficiency 01/10/2015  . 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 Anemia, iron deficiency 11/03/2014  . Chronic pain syndrome 10/24/2014  . Osteoporosis 10/05/2014  . Vertebral compression fracture ([place]) 10/05/2014  . Coronary artery disease due to calcified coronary lesion 10/05/2014  . Weight loss   . Tobacco abuse   . History of Roux-en-Y gastric bypass   . Orthostatic hypotension 08/11/2014  . S/P jejunostomy Feb 2016 07/26/2014  . Constipation, chronic 07/10/2013  . Chronic abdominal pain   . COPD (chronic obstructive pulmonary disease) ([place]) 11/07/2010   . Major depressive disorder, recurrent episode, moderate ([place]) 06/06/2010  . 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 Anxiety state 01/07/2007  . BARRETT'S ESOPHAGUS, HX OF 07/03/2005    Past Medical History  Diagnosis Date  . Peptic ulcer disease     dr Oletta Lamas  . Acute duodenal ulcer with hemorrhage and perforation, with obstruction ([place])   . Barrett's esophagus   . Menopause   . GERD (gastroesophageal reflux disease)   . Asthma   . Chronic abdominal pain     narcotic dependence, dr gyarteng-dak at [facility]  . Narcotic abuse   . Anxiety     sees [name] NP at Dr. Radonna Ricker office  . Fx two ribs-open 08-24-11    left 4th and 5th  . Fx of fibula 07-28-11    left fibula, 2 places   . COPD (chronic obstructive pulmonary disease) ([place])   . Allergy   . Anemia   . History of blood transfusion     "related to OR; maybe when I perforated that ulcer"  . Lap Nissen + truncal vagotomy July 2008 05/13/2013  . LEG EDEMA 01/19/2008    Qualifier: Diagnosis of  By: [name] MD, [name]   . ACUTE DUODEN ULCER W/HEMORR PERF&OBSTRUCTION 06/26/2004    Qualifier: Diagnosis of  By: [name] MD, [name]   . FEVER, RECURRENT 08/11/2009    Qualifier: Diagnosis of  By: [name] MD, [name] MENOPAUSE 01/07/2007    Qualifier: Diagnosis of  By: [name], CMA, [place]    .  Gastroparesis 06/03/2007    Qualifier: Diagnosis of  By: [name] MD, [name]   . GASTRIC OUTLET OBSTRUCTION 08/28/2007    In July 2008 she underwent laparoscopic enterolysis, Nissen fundoplication over a #22 bougie, single pledgeted suture colosure of the hiatus and a 3 suture wrap.  Lap truncal vagotomy and a loop gastrojejunostomy was performed.  This was revised in August of 2009 to a roux en Y gastrojejujostomy.     . 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 Depression   . History of hiatal hernia   . Complication of anesthesia     pt states had too much xanax on board - agitated   . Anginal pain ([place])      "many many years ago"  . Exertional shortness of breath   . Pneumonia, organism unspecified 11/07/2010    Assoc with R Parapneumonic effusion 09/2010    - Tapped 10/05/10    - CxR resolved 11/07/2010    . Headache     "monthly" (09/06/2014)  . Arthritis     "hips" (09/06/2014)  . Myocardial infarction [facility])     Past Surgical History  Procedure Laterality Date  . Esophagogastroduodenoscopy  12-29-09    dr qadeer at [facility], several gastric ulcers  . Laparoscopic nissen fundoplication    . Gastrojejunostomy  02-20-08    Roux en Y gastrojejunsotomy w 1 foot Roux limb  . Repair of perforated ulcer    . Biopsy thyroid  08-13-11    benign nodule, per Dr. Melida Quitter   . Esophagogastroduodenoscopy N/A 09/25/2012    Procedure: ESOPHAGOGASTRODUODENOSCOPY (EGD);  Surgeon: Beryle Beams, MD;  Location: [facility];  Service: Endoscopy;  Laterality: N/A;  . Fracture surgery    . Tonsillectomy    . Cataract extraction w/ intraocular lens  implant, bilateral Bilateral   . Laparoscopic partial gastrectomy N/A 06/29/2013    Procedure: Gastrectomy and GJ Tube placement;  Surgeon: Pedro Earls, MD;  Location: [facility];  Service: General;  Laterality: N/A;  . Colonoscopy    . Gastrostomy N/A 07/26/2014    Procedure: LAPRASCOPIC ASSISTED OPEN PLACEMENT OF JEJUNOSTOMY TUBE;  Surgeon: Pedro Earls, MD;  Location: [facility];  Service: General;  Laterality: N/A;  . Orif hip fracture Bilateral     "pins in both of them"  . Dilation and curettage of uterus    . Hernia repair      "hiatal"  . Joint replacement      Social History   Social History  . Marital Status: Divorced    Spouse Name: N/A  . Number of Children: N/A  . Years of Education: N/A   Occupational History  . Not on file.   Social History Main Topics  . Smoking status: Current Every Day Smoker -- 2.00 packs/day for 50 years    Types: Cigarettes  . Smokeless tobacco: Never Used  . Alcohol Use: No  . Drug Use: No  . Sexual Activity: No    Other Topics Concern  . Not on file   Social History Narrative    Family History  Problem Relation Age of Onset  . Cancer Mother     kidney, magliant tumor on face  . Stroke Mother   . Celiac disease Father   . Cancer Father     kidney and pancreatic    Current Facility-Administered Medications  Medication Dose Route Frequency Provider Last Rate Last Dose  . 0.9 %  sodium chloride infusion   Intravenous  Continuous Archie Patten, MD 50 mL/hr at 04/08/15 2211    . ALPRAZolam [name]) tablet 1 mg  1 mg Oral TID PRN Archie Patten, MD   1 mg at 04/09/15 0805  . aspirin EC tablet 81 mg  81 mg Oral Daily Archie Patten, MD   81 mg at 04/08/15 1840  . bisacodyl (DULCOLAX) suppository 10 mg  10 mg Rectal Q12H PRN Michael Boston, MD      . cephALEXin (KEFLEX) capsule 500 mg  500 mg Oral BID Archie Patten, MD   500 mg at 04/08/15 2300  . [START ON 04/10/2015] DULoxetine (CYMBALTA) DR capsule 60 mg  60 mg Oral Daily Michael Boston, MD      . enoxaparin (LOVENOX) injection 40 mg  40 mg Subcutaneous Q24H Archie Patten, MD   40 mg at 04/08/15 1839  . [START ON 04/12/2015] ergocalciferol (VITAMIN D2) capsule 50,000 Units  50,000 Units Oral Q Wed Crystal Libby Maw, MD      . feeding supplement (BOOST / RESOURCE BREEZE) liquid 1 Container  1 Container Oral TID BM Archie Patten, MD   1 Container at 04/08/15 2018  . HYDROcodone-acetaminophen (NORCO) 10-325 MG per tablet 1-2 tablet  1-2 tablet Oral Q6H PRN Archie Patten, MD   2 tablet at 04/09/15 0805  . ipratropium-albuterol (DUONEB) 0.5-2.5 (3) MG/3ML nebulizer solution 3 mL  3 mL Nebulization Q4H PRN Archie Patten, MD      . mirtazapine (REMERON) tablet 15 mg  15 mg Oral QHS Archie Patten, MD   15 mg at 04/08/15 2300  . multivitamin with minerals tablet 1 tablet  1 tablet Oral Daily Archie Patten, MD   1 tablet at 04/08/15 1840  . nicotine (NICODERM CQ - dosed in mg/24 hours) patch 21 mg  21 mg Transdermal Daily Archie Patten, MD   21 mg at 04/08/15 1840  . OLANZapine (ZYPREXA) tablet 7.5 mg  7.5 mg Oral QHS Archie Patten, MD   7.5 mg at 04/08/15 2300  . ondansetron (ZOFRAN-ODT) disintegrating tablet 4-8 mg  4-8 mg Oral Q8H PRN Archie Patten, MD      . polyethylene glycol (MIRALAX / GLYCOLAX) packet 17 g  17 g Oral Daily PRN Archie Patten, MD      . polyethylene glycol (MIRALAX / GLYCOLAX) packet 17 g  17 g Oral BID Michael Boston, MD      . saccharomyces boulardii (FLORASTOR) capsule 250 mg  250 mg Oral BID Michael Boston, MD         Allergies  Allergen Reactions  . Lithium Nausea And Vomiting  . Celebrex [Celecoxib] Other (See Comments)    History of bleeding ulcers  . Morphine Itching  . Quetiapine Other (See Comments)     tardive dyskinesia  . Varenicline Tartrate Other (See Comments)    hallucinations    BP 95/53 mmHg  Pulse 65  Temp(Src) 97.9 F (36.6 C) (Oral)  Resp 18  Ht 5\' 3"  (1.6 m)  Wt 50 kg (110 lb 3.7 oz)  BMI 19.53 kg/m2  SpO2 93%  No results found.  Note: This dictation was prepared with Dragon/digital dictation along with [employer]. Any transcriptional errors that result from this process are unintentional.

## 2015-04-10 ENCOUNTER — Ambulatory Visit (INDEPENDENT_AMBULATORY_CARE_PROVIDER_SITE_OTHER): Payer: Medicare Other | Admitting: Family Medicine

## 2015-04-10 VITALS — BP 96/60 | HR 90 | Temp 99.1°F | Resp 14 | Wt 105.0 lb

## 2015-04-10 DIAGNOSIS — D509 Iron deficiency anemia, unspecified: Secondary | ICD-10-CM | POA: Diagnosis not present

## 2015-04-10 DIAGNOSIS — E559 Vitamin D deficiency, unspecified: Secondary | ICD-10-CM

## 2015-04-10 DIAGNOSIS — G8929 Other chronic pain: Secondary | ICD-10-CM

## 2015-04-10 DIAGNOSIS — D530 Protein deficiency anemia: Secondary | ICD-10-CM | POA: Diagnosis not present

## 2015-04-10 DIAGNOSIS — Z720 Tobacco use: Secondary | ICD-10-CM

## 2015-04-10 DIAGNOSIS — R5381 Other malaise: Secondary | ICD-10-CM

## 2015-04-10 DIAGNOSIS — R109 Unspecified abdominal pain: Secondary | ICD-10-CM | POA: Diagnosis not present

## 2015-04-10 DIAGNOSIS — N2581 Secondary hyperparathyroidism of renal origin: Secondary | ICD-10-CM

## 2015-04-10 DIAGNOSIS — L97921 Non-pressure chronic ulcer of unspecified part of left lower leg limited to breakdown of skin: Secondary | ICD-10-CM

## 2015-04-10 DIAGNOSIS — R634 Abnormal weight loss: Secondary | ICD-10-CM | POA: Diagnosis not present

## 2015-04-10 DIAGNOSIS — G894 Chronic pain syndrome: Secondary | ICD-10-CM

## 2015-04-10 DIAGNOSIS — F331 Major depressive disorder, recurrent, moderate: Secondary | ICD-10-CM | POA: Diagnosis not present

## 2015-04-10 DIAGNOSIS — Z79899 Other long term (current) drug therapy: Secondary | ICD-10-CM

## 2015-04-10 DIAGNOSIS — H3530 Unspecified macular degeneration: Secondary | ICD-10-CM

## 2015-04-10 DIAGNOSIS — F1990 Other psychoactive substance use, unspecified, uncomplicated: Secondary | ICD-10-CM

## 2015-04-10 DIAGNOSIS — F411 Generalized anxiety disorder: Secondary | ICD-10-CM | POA: Diagnosis not present

## 2015-04-10 DIAGNOSIS — F1910 Other psychoactive substance abuse, uncomplicated: Secondary | ICD-10-CM

## 2015-04-10 DIAGNOSIS — Z903 Acquired absence of stomach [part of]: Secondary | ICD-10-CM

## 2015-04-10 DIAGNOSIS — E43 Unspecified severe protein-calorie malnutrition: Secondary | ICD-10-CM | POA: Diagnosis not present

## 2015-04-10 DIAGNOSIS — K5900 Constipation, unspecified: Secondary | ICD-10-CM

## 2015-04-10 LAB — POCT CBC
GRANULOCYTE PERCENT: 76.1 % (ref 37–80)
HCT, POC: 30.4 % — AB (ref 37.7–47.9)
Hemoglobin: 9.8 g/dL — AB (ref 12.2–16.2)
Lymph, poc: 1.3 (ref 0.6–3.4)
MCH: 31.1 pg (ref 27–31.2)
MCHC: 32.1 g/dL (ref 31.8–35.4)
MCV: 96.8 fL (ref 80–97)
MID (CBC): 0.6 (ref 0–0.9)
MPV: 7.4 fL (ref 0–99.8)
PLATELET COUNT, POC: 346 10*3/uL (ref 142–424)
POC Granulocyte: 6.2 (ref 2–6.9)
POC LYMPH %: 16.2 % (ref 10–50)
POC MID %: 7.7 % (ref 0–12)
RBC: 3.14 M/uL — AB (ref 4.04–5.48)
RDW, POC: 18.1 %
WBC: 8.2 10*3/uL (ref 4.6–10.2)

## 2015-04-10 MED ORDER — HYDROCODONE-ACETAMINOPHEN 10-325 MG PO TABS: 1.0000 | ORAL_TABLET | Freq: Four times a day (QID) | ORAL | Status: DC | PRN

## 2015-04-10 MED ORDER — ALPRAZOLAM 1 MG PO TABS: 1.0000 mg | ORAL_TABLET | Freq: Three times a day (TID) | ORAL | Status: DC | PRN

## 2015-04-10 MED ORDER — KETOROLAC TROMETHAMINE 30 MG/ML IJ SOLN
30.0000 mg | Freq: Once | INTRAMUSCULAR | Status: AC
  Administered 2015-04-10: 30 mg via INTRAMUSCULAR

## 2015-04-10 MED ORDER — DULOXETINE HCL 30 MG PO CPEP: 30.0000 mg | ORAL_CAPSULE | Freq: Every day | ORAL | Status: DC

## 2015-04-10 MED ORDER — ALPRAZOLAM 0.25 MG PO TABS
1.0000 mg | ORAL_TABLET | Freq: Once | ORAL | Status: AC
  Administered 2015-04-10: 1 mg via ORAL

## 2015-04-10 NOTE — Progress Notes (Addendum)
Subjective:  This chart was scribed for [name] Cheadle, MD by [facility], medical scribe at [facility].The patient was seen in exam room 01 and the patient's care was started at 6:01 PM.   Patient ID: [name], female    DOB: [birth year], 64 y.o.   MRN: [id number] Chief Complaint  Patient presents with  . Leg Pain    Swollen, painful   HPI HPI Comments: [name] is a 64 y.o. female who presents to [facility] complaining of bilateral leg pain with associated swelling. Has not gotten records from hee  Seen in the ofifce 10/15 due to a panic attack, because she was out of pain meds. Hospitalized due to hypoxia, SI and evaluation of her protein/calorie malnutrition. General surgery saw her and considered her for TPN since her albumin was 2 but pre albumin was normal so they recommended she start nutritional counseling and she was discharged. Pt had an appoint with a new psych, but did not go today.  Pt's UDS was appropriate, she was hypokalemic and hypocalcemic, normal magnesium and phosphorus. Total protein was 4.3 with albumin of 2.0 though normal total albumin of 20. Severe anemia with hemoglobin down at 8.6 a drop from 12.5 few months ago and 10.3 3 days ago. Possible dilutional but WBC and platelets were unchanged. persistently normal B12 though she does have a very low vitamin D. Iron on the low end of normal. Weight was 110 yesterday. She is having BM.  Past Medical History  Diagnosis Date  . Peptic ulcer disease     dr Oletta Lamas  . Acute duodenal ulcer with hemorrhage and perforation, with obstruction ([place])   . Barrett's esophagus   . Menopause   . GERD (gastroesophageal reflux disease)   . Asthma   . Chronic abdominal pain     narcotic dependence, dr gyarteng-dak at [facility]  . Narcotic abuse   . Anxiety     sees [name] NP at Dr. Radonna Ricker office  . Fx two ribs-open 08-24-11    left 4th and 5th  . Fx of fibula 07-28-11     left fibula, 2 places   . COPD (chronic obstructive pulmonary disease) ([place])   . Allergy   . Anemia   . History of blood transfusion     "related to OR; maybe when I perforated that ulcer"  . Lap Nissen + truncal vagotomy July 2008 05/13/2013  . LEG EDEMA 01/19/2008    Qualifier: Diagnosis of  By: [name] MD, [name]   . ACUTE DUODEN ULCER W/HEMORR PERF&OBSTRUCTION 06/26/2004    Qualifier: Diagnosis of  By: [name] MD, [name]   . FEVER, RECURRENT 08/11/2009    Qualifier: Diagnosis of  By: [name] MD, [name] MENOPAUSE 01/07/2007    Qualifier: Diagnosis of  By: [name], CMA, [place]    . Gastroparesis 06/03/2007    Qualifier: Diagnosis of  By: [name] MD, [name]   . GASTRIC OUTLET OBSTRUCTION 08/28/2007    In July 2008 she underwent laparoscopic enterolysis, Nissen fundoplication over a #40 bougie, single pledgeted suture colosure of the hiatus and a 3 suture wrap.  Lap truncal vagotomy and a loop gastrojejunostomy was performed.  This was revised in August of 2009 to a roux en Y gastrojejujostomy.     . 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 Depression   . History of hiatal hernia   . Complication  of anesthesia     pt states had too much xanax on board - agitated   . Anginal pain ([place])     "many many years ago"  . Exertional shortness of breath   . Pneumonia, organism unspecified 11/07/2010    Assoc with R Parapneumonic effusion 09/2010    - Tapped 10/05/10    - CxR resolved 11/07/2010    . Headache     "monthly" (09/06/2014)  . Arthritis     "hips" (09/06/2014)  . Myocardial infarction ([place])   . S/P jejunostomy Feb 2016 07/26/2014   Current Outpatient Prescriptions on File Prior to Visit  Medication Sig Dispense Refill  . Aspirin-Acetaminophen-Caffeine (GOODY HEADACHE PO) Take 1 packet by mouth 4 (four) times daily as needed (pain).    . cephALEXin (KEFLEX) 500 MG capsule Take 1 capsule (500 mg total) by mouth 2 (two) times daily. (Patient taking differently: Take  500 mg by mouth 2 (two) times daily. 7 day course filled 04/03/15) 14 capsule 0  . diazepam (VALIUM) 5 MG tablet Take 5 mg by mouth every 12 (twelve) hours as needed for anxiety.   0  . ergocalciferol (VITAMIN D2) 50000 UNITS capsule Take 1 capsule (50,000 Units total) by mouth once a week. 24 capsule 0  . feeding supplement (BOOST / RESOURCE BREEZE) LIQD Take 1 Container by mouth 3 (three) times daily between meals. 90 Container 11  . furosemide (LASIX) 20 MG tablet Take 1 tablet (20 mg total) by mouth 2 (two) times daily. Take 1 with breakfast and 1 with lunch as needed for leg swelling (Patient taking differently: Take 20 mg by mouth 2 (two) times daily as needed. Take 1 tablet (20 mg) by mouth with breakfast and/or lunch as needed for swelling) 60 tablet 0  . OLANZapine (ZYPREXA) 7.5 MG tablet Take 1 tablet (7.5 mg total) by mouth at bedtime. 30 tablet 1  . pantoprazole (PROTONIX) 40 MG tablet Take 1 tablet (40 mg total) by mouth 2 (two) times daily before a meal. 180 tablet 3  . ALPRAZolam (XANAX) 1 MG tablet Take 1 tablet (1 mg total) by mouth 3 (three) times daily as needed for anxiety. (Patient not taking: Reported on 04/08/2015) 42 tablet 0  . DULoxetine (CYMBALTA) 30 MG capsule Take 1 capsule (30 mg total) by mouth daily. (Patient not taking: Reported on 04/08/2015) 30 capsule 0  . HYDROcodone-acetaminophen (NORCO) 10-325 MG tablet Take 1-2 tablets by mouth every 6 (six) hours as needed for moderate pain. (Patient not taking: Reported on 04/10/2015) 20 tablet 0  . ondansetron (ZOFRAN ODT) 4 MG disintegrating tablet Take 1-2 tablets (4-8 mg total) by mouth every 8 (eight) hours as needed for nausea or vomiting. (Patient not taking: Reported on 04/08/2015) 180 tablet 0  . potassium chloride SA (K-DUR,KLOR-CON) 20 MEQ tablet Take 1 tablet (20 mEq total) by mouth daily. (Patient not taking: Reported on 04/10/2015) 30 tablet 0   No current facility-administered medications on file prior to visit.     Allergies  Allergen Reactions  . Lithium Nausea And Vomiting  . Celebrex [Celecoxib] Other (See Comments)    History of bleeding ulcers  . Morphine Itching  . Quetiapine Other (See Comments)     tardive dyskinesia  . Varenicline Tartrate Other (See Comments)    hallucinations   Review of Systems  Constitutional: Positive for activity change and fatigue. Negative for fever, chills, appetite change and unexpected weight change.  Respiratory: Positive for cough and shortness of breath. Negative for  wheezing.   Cardiovascular: Positive for leg swelling. Negative for chest pain and palpitations.  Gastrointestinal: Positive for nausea, abdominal pain and constipation. Negative for vomiting, diarrhea, blood in stool, abdominal distention, anal bleeding and rectal pain.  Musculoskeletal: Positive for myalgias, back pain, joint swelling, arthralgias and gait problem.  Skin: Negative for color change and rash.  Neurological: Positive for weakness and headaches. Negative for seizures, syncope, speech difficulty and numbness.  Hematological: Bruises/bleeds easily.  Psychiatric/Behavioral: Positive for behavioral problems, confusion, dysphoric mood and decreased concentration. Negative for suicidal ideas, hallucinations, sleep disturbance, self-injury and agitation. The patient is nervous/anxious. The patient is not hyperactive.        Objective:   BP 96/60 mmHg  Pulse 90  Temp(Src) 99.1 F (37.3 C) (Oral)  Resp 14  Wt 105 lb (47.628 kg)  SpO2 95%   Physical Exam  Constitutional: She is oriented to person, place, and time. She appears well-developed and well-nourished. No distress.  HENT:  Head: Normocephalic and atraumatic.  Right Ear: External ear normal.  Left Ear: External ear normal.  Nose: Nose normal.  Eyes: Conjunctivae are normal. Right eye exhibits no discharge. Left eye exhibits no discharge. No scleral icterus.  Neck: Neck supple. No thyromegaly present.  Cardiovascular:  Normal rate, regular rhythm and normal heart sounds.   Pulmonary/Chest: Effort normal. No accessory muscle usage. No respiratory distress. She has no decreased breath sounds. She has wheezes in the right lower field, the left upper field and the left lower field. She has rhonchi in the right lower field and the left lower field. She has no rales.  Not wearing oxygen. Talks in complete sentences and walks through clinic w/o apparent dyspnea.  Musculoskeletal: She exhibits edema and tenderness.  Lymphadenopathy:    She has no cervical adenopathy.  Neurological: She is alert and oriented to person, place, and time.  Skin: Skin is warm and dry. She is not diaphoretic. No erythema.  Psychiatric: She has a normal mood and affect. Her behavior is normal.          Assessment & Plan:   1. History of subtotal gastrectomy with Roux-en-Y recontruction - pt has restarted prot supp shakes - breeze - bid between meals.  Needs to make an appt w/ the nutritionist she was referred to in the hosp  2. Chronic pain syndrome - pt again instructed to go sign release at [facility] clinic so she can get her records. If she has not done this by f/u she should just sign a release here and we can get her records fax to [place] - need to have thse before she can get an appt with a pain clinic.  Pt cannot take more than 8 tabs of hydrocodone 10 qd due to the acetaminophen component. Has failed oxycodone and fetanyl patch prior but pain still uncontrolled on current regimen - pt reluctant to be seen at a pain clinic due to prior experience at [facility] but I think they would likely be able to adjust pt's medication regimen to achieve better pain control despite her prior intolerances. Pt repeatedly using more than rx EVERY SINGLE TIME when rx'd 1 mo at a time with no improvement when I changed to ony 2 wk supply at a time so now refills limited to 1 wk supply at a time  3. Anemia, iron deficiency   4. Secondary hyperparathyroidism ([place])   5.  Major depressive disorder, recurrent episode, moderate ([facility]) - stop remeron, start cymbalta. Pt needs to reschedule her appt  w/ pych that she missed yest when she was in the hosp.  6. Anxiety state   7. Chronic abdominal pain   8. Severe protein-calorie malnutrition ([place])   9. Weight loss   10. Tobacco abuse - trying to switch to electronic but   11. Vitamin D deficiency   12. Anemia due to protein deficiency   13. Constipation, unspecified constipation type - colace and miralax.  14. Polypharmacy   15. Misuse of prescription only drugs   16. Lower extremity ulceration, left, limited to breakdown of skin ([place]) - healing very well - wound care done today - cont ace wrap to treat edema.   17. Physical deconditioning - pt would really benefit from PT - discuss at f/u  18. Macular degeneration   Pt having trouble with medication compliance, cannot drive due to vision so largely homebound so I rec to pt that we will try to get her a homehealth nurse - hopefully nursing could do med box fills/med check 1-2x/wk as well as wound care. She needs to have her diet/intake monitored with continues high protein diet teaching and monitor weight regularly for both fluid status/edema as well as malnutrition/underweight bmi reasons.  Pt also advised that if she could get a home care aide in addition that will likely relieve her from a large amount of stress she has from being unable to do her IADLs and thereby give her increased time to follow-up with the multiple referrals she needs and improve her depression since will not feel guilty or worthless when she can't take care of herself.  Her mother, who has dementia and is in a nurseing home - not doing very well apparently - would also benefit from more time with Xiomara as well as vice versa - Silvia has a  Lot of guilt about not being able to provide any mental, physical, and emotional spport to her mother if she had more hours in the day for this which a home aide  would/should provide.  Orders Placed This Encounter  Procedures  . Vit D  25 hydroxy (rtn osteoporosis monitoring)  . Ferritin  . Iron  . IBC Panel  . COMPLETE METABOLIC PANEL WITH GFR  . Ambulatory referral to Home Health    Referral Priority:  Routine    Referral Type:  Home Health Care    Referral Reason:  Specialty Services Required    Requested Specialty:  [place]    Number of Visits Requested:  1  . Ambulatory Referral to DSME/T    Referral Priority:  Routine    Referral Type:  Consultation    Number of Visits Requested:  1  . POCT CBC    Meds ordered this encounter  Medications  . DISCONTD: omeprazole (PRILOSEC) 40 MG capsule    Sig: Take 40 mg by mouth daily.  Marland Kitchen DISCONTD: mirtazapine (REMERON) 30 MG tablet    Sig: Take 30 mg by mouth at bedtime.  . DULoxetine (CYMBALTA) 30 MG capsule    Sig: Take 1 capsule (30 mg total) by mouth daily.    Dispense:  30 capsule    Refill:  0  . DISCONTD: HYDROcodone-acetaminophen (NORCO) 10-325 MG tablet    Sig: Take 1-2 tablets by mouth every 6 (six) hours as needed for moderate pain. This is a 1 week supply. You will be due for a refill on Tuesday 10/25    Dispense:  60 tablet    Refill:  0    May fill today  .  DISCONTD: ALPRAZolam (XANAX) 1 MG tablet    Sig: Take 1 tablet (1 mg total) by mouth 3 (three) times daily as needed for anxiety. This is a 1 week supply    Dispense:  21 tablet    Refill:  0  . ketorolac (TORADOL) 30 MG/ML injection 30 mg    Sig:   . ALPRAZolam (XANAX) tablet 1 mg    Sig:   . polyethylene glycol powder (GLYCOLAX/MIRALAX) powder    Sig: Take 17 g by mouth 2 (two) times daily as needed.    Dispense:  850 g    Refill:  11  . potassium chloride SA (K-DUR,KLOR-CON) 20 MEQ tablet    Sig: Take 1 tablet (20 mEq total) by mouth 2 (two) times daily.    Dispense:  60 tablet    Refill:  0  . furosemide (LASIX) 20 MG tablet    Sig: Take 1 tablet (20 mg) by mouth with breakfast and/or lunch as  needed for swelling    Dispense:  60 tablet    Refill:  0    I personally performed the services described in this documentation, which was scribed in my presence. The recorded information has been reviewed and considered, and addended by me as needed.  [name] Cheadle, MD MPH  Results for orders placed or performed in visit on 04/10/15  Vit D  25 hydroxy (rtn osteoporosis monitoring)  Result Value Ref Range   Vit D, 25-Hydroxy 17 (L) 30 - 100 ng/mL  Ferritin  Result Value Ref Range   Ferritin 19 10 - 291 ng/mL  Iron  Result Value Ref Range   Iron 70 45 - 160 ug/dL  IBC Panel  Result Value Ref Range   UIBC 201 125 - 400 ug/dL   TIBC 271 250 - 450 ug/dL   %SAT 26 11 - 50 %  COMPLETE METABOLIC PANEL WITH GFR  Result Value Ref Range   Sodium 141 135 - 146 mmol/L   Potassium 2.9 (L) 3.5 - 5.3 mmol/L   Chloride 107 98 - 110 mmol/L   CO2 29 20 - 31 mmol/L   Glucose, Bld 153 (H) 65 - 99 mg/dL   BUN 12 7 - 25 mg/dL   Creat 0.64 0.50 - 0.99 mg/dL   Total Bilirubin 0.2 0.2 - 1.2 mg/dL   Alkaline Phosphatase 69 33 - 130 U/L   AST 15 10 - 35 U/L   ALT 13 6 - 29 U/L   Total Protein 4.4 (L) 6.1 - 8.1 g/dL   Albumin 2.4 (L) 3.6 - 5.1 g/dL   Calcium 7.6 (L) 8.6 - 10.4 mg/dL   GFR, Est African American >89 >=60 mL/min   GFR, Est Non African American >89 >=60 mL/min  POCT CBC  Result Value Ref Range   WBC 8.2 4.6 - 10.2 K/uL   Lymph, poc 1.3 0.6 - 3.4   POC LYMPH PERCENT 16.2 10 - 50 %L   MID (cbc) 0.6 0 - 0.9   POC MID % 7.7 0 - 12 %M   POC Granulocyte 6.2 2 - 6.9   Granulocyte percent 76.1 37 - 80 %G   RBC 3.14 (A) 4.04 - 5.48 M/uL   Hemoglobin 9.8 (A) 12.2 - 16.2 g/dL   HCT, POC 30.4 (A) 37.7 - 47.9 %   MCV 96.8 80 - 97 fL   MCH, POC 31.1 27 - 31.2 pg   MCHC 32.1 31.8 - 35.4 g/dL   RDW, POC 18.1 %  Platelet Count, POC 346 142 - 424 K/uL   MPV 7.4 0 - 99.8 fL

## 2015-04-10 NOTE — Patient Instructions (Signed)
SIGN [facility] RELEASE SO YOU CAN GET YOUR RECORDS. RESCHEDULE YOUR APPT WITH THE PSYCHIATRIST. SCHEDULE A VISIT WITH THE NUTRITIONIST TAKE BOOST/RESOUCE/BREEZE SUPPLEMENTS TWICE A DAY IN BETWEEN MEALS OR AFTER A MEAL. LEAVE THE XEROFORM (YELLOW THIN GAUZE) ON YOUR WOUND FOR 3 DAYS - YOU CAN REMOVE IT ON Thursday AND THEN [place] IT WITH SOAP AND WATER. RECHECK WITH ME IN 1 WEEK. START CYMBALTA.

## 2015-04-11 LAB — COMPLETE METABOLIC PANEL WITHOUT GFR
ALT: 13 U/L (ref 6–29)
AST: 15 U/L (ref 10–35)
Albumin: 2.4 g/dL — ABNORMAL LOW (ref 3.6–5.1)
Alkaline Phosphatase: 69 U/L (ref 33–130)
BUN: 12 mg/dL (ref 7–25)
CO2: 29 mmol/L (ref 20–31)
Calcium: 7.6 mg/dL — ABNORMAL LOW (ref 8.6–10.4)
Chloride: 107 mmol/L (ref 98–110)
Creat: 0.64 mg/dL (ref 0.50–0.99)
GFR, Est African American: >89 mL/min
GFR, Est Non African American: >89 mL/min
Glucose, Bld: 153 mg/dL — ABNORMAL HIGH (ref 65–99)
Potassium: 2.9 mmol/L — ABNORMAL LOW (ref 3.5–5.3)
Sodium: 141 mmol/L (ref 135–146)
Total Bilirubin: 0.2 mg/dL (ref 0.2–1.2)
Total Protein: 4.4 g/dL — ABNORMAL LOW (ref 6.1–8.1)

## 2015-04-11 LAB — FERRITIN: FERRITIN: 19 ng/mL (ref 10–291)

## 2015-04-11 LAB — IBC PANEL
%SAT: 26 % (ref 11–50)
TIBC: 271 ug/dL (ref 250–450)
UIBC: 201 ug/dL (ref 125–400)

## 2015-04-11 LAB — IRON: Iron: 70 ug/dL (ref 45–160)

## 2015-04-12 DIAGNOSIS — R109 Unspecified abdominal pain: Secondary | ICD-10-CM | POA: Insufficient documentation

## 2015-04-12 LAB — VITAMIN D 25 HYDROXY (VIT D DEFICIENCY, FRACTURES): Vit D, 25-Hydroxy: 17 ng/mL — ABNORMAL LOW (ref 30–100)

## 2015-04-12 MED ORDER — POLYETHYLENE GLYCOL 3350 17 GM/SCOOP PO POWD: 17.0000 g | Freq: Two times a day (BID) | ORAL | Status: DC | PRN

## 2015-04-12 MED ORDER — FUROSEMIDE 20 MG PO TABS: ORAL_TABLET | ORAL | Status: DC

## 2015-04-12 MED ORDER — POTASSIUM CHLORIDE CRYS ER 20 MEQ PO TBCR: 20.0000 meq | EXTENDED_RELEASE_TABLET | Freq: Two times a day (BID) | ORAL | Status: DC

## 2015-04-17 ENCOUNTER — Ambulatory Visit (INDEPENDENT_AMBULATORY_CARE_PROVIDER_SITE_OTHER): Payer: Medicare Other | Admitting: Family Medicine

## 2015-04-17 VITALS — BP 128/76 | HR 80 | Temp 99.6°F | Resp 16 | Ht 65.0 in | Wt 106.0 lb

## 2015-04-17 DIAGNOSIS — I878 Other specified disorders of veins: Secondary | ICD-10-CM

## 2015-04-17 DIAGNOSIS — G894 Chronic pain syndrome: Secondary | ICD-10-CM

## 2015-04-17 DIAGNOSIS — J440 Chronic obstructive pulmonary disease with acute lower respiratory infection: Secondary | ICD-10-CM

## 2015-04-17 MED ORDER — HYDROCODONE-ACETAMINOPHEN 10-325 MG PO TABS: 1.0000 | ORAL_TABLET | Freq: Four times a day (QID) | ORAL | Status: DC | PRN

## 2015-04-17 MED ORDER — IPRATROPIUM BROMIDE 0.02 % IN SOLN
0.5000 mg | Freq: Once | RESPIRATORY_TRACT | Status: AC
  Administered 2015-04-17: 0.5 mg via RESPIRATORY_TRACT

## 2015-04-17 MED ORDER — ALPRAZOLAM 1 MG PO TABS: 1.0000 mg | ORAL_TABLET | Freq: Three times a day (TID) | ORAL | Status: DC | PRN

## 2015-04-17 MED ORDER — ALBUTEROL SULFATE (2.5 MG/3ML) 0.083% IN NEBU
2.5000 mg | INHALATION_SOLUTION | Freq: Once | RESPIRATORY_TRACT | Status: AC
  Administered 2015-04-17: 2.5 mg via RESPIRATORY_TRACT

## 2015-04-17 MED ORDER — METHYLPREDNISOLONE ACETATE 80 MG/ML IJ SUSP
80.0000 mg | Freq: Once | INTRAMUSCULAR | Status: AC
  Administered 2015-04-17: 80 mg via INTRAMUSCULAR

## 2015-04-17 MED ORDER — CALCIUM CITRATE-VITAMIN D 500-500 MG-UNIT PO CHEW: 1.0000 | CHEWABLE_TABLET | Freq: Two times a day (BID) | ORAL | Status: DC

## 2015-04-17 MED ORDER — IPRATROPIUM-ALBUTEROL 0.5-2.5 (3) MG/3ML IN SOLN: 3.0000 mL | Freq: Three times a day (TID) | RESPIRATORY_TRACT | Status: DC

## 2015-04-17 MED ORDER — KETOROLAC TROMETHAMINE 30 MG/ML IJ SOLN
30.0000 mg | Freq: Once | INTRAMUSCULAR | Status: AC
  Administered 2015-04-17: 30 mg via INTRAMUSCULAR

## 2015-04-17 NOTE — Progress Notes (Addendum)
Subjective:    Patient ID: [name], female    DOB: January 27, 1951, 64 y.o.   MRN: [id number] This chart was scribed for [name], MD by Marti Sleigh, Medical Scribe. This patient was seen in Room 13 and the patient's care was started a 2:53 PM.  Chief Complaint  Patient presents with  . Medication Refill    hydrocodone acetaminophen 10-325 mg  . Follow-up    ulcer on leg  and foot swelling   . Breathing Problem    x 2 days     HPI HPI Comments: [name] is a 64 y.o. female who presents to [facility] complaining of continued difficulty breathing and continued chronic pain, worse in her legs. She states she has not had a nebulizer for the last five months, and believes someone stole it when she moved. She states she is still smoking cigarettes, as well as using electronic cigarettes. She denies fever or chills. States she feels fluid in her lungs, but has not coughed up any fluid.  At our last visit one week ago I asked her to reschedule with her psychiatrist, nutritionist, and sign a release for pain management. At that visit I took her off her remoron and put her on cymbalta and prescribed her one week of pain medication.    Past Medical History  Diagnosis Date  . Peptic ulcer disease     dr Oletta Lamas  . Acute duodenal ulcer with hemorrhage and perforation, with obstruction ([place])   . Barrett's esophagus   . Menopause   . GERD (gastroesophageal reflux disease)   . Asthma   . Chronic abdominal pain     narcotic dependence, dr gyarteng-dak [facility]  . Narcotic abuse   . Anxiety     sees [name] NP at Dr. Radonna Ricker office  . Fx two ribs-open 08-24-11    left 4th and 5th  . Fx of fibula 07-28-11    left fibula, 2 places   . COPD (chronic obstructive pulmonary disease) ([place])   . Allergy   . Anemia   . History of blood transfusion     "related to OR; maybe when I perforated that ulcer"  . Lap Nissen + truncal vagotomy July 2008 05/13/2013  . LEG EDEMA 01/19/2008      Qualifier: Diagnosis of  By: [name] MD, [name]   . ACUTE DUODEN ULCER W/HEMORR PERF&OBSTRUCTION 06/26/2004    Qualifier: Diagnosis of  By: [name] MD, [name]   . FEVER, RECURRENT 08/11/2009    Qualifier: Diagnosis of  By: [name] MD, [name] MENOPAUSE 01/07/2007    Qualifier: Diagnosis of  By: [name], CMA, [place]    . Gastroparesis 06/03/2007    Qualifier: Diagnosis of  By: [name] MD, [name]   . GASTRIC OUTLET OBSTRUCTION 08/28/2007    In July 2008 she underwent laparoscopic enterolysis, Nissen fundoplication over a #20 bougie, single pledgeted suture colosure of the hiatus and a 3 suture wrap.  Lap truncal vagotomy and a loop gastrojejunostomy was performed.  This was revised in August of 2009 to a roux en Y gastrojejujostomy.     . 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 Depression   . History of hiatal hernia   . Complication of anesthesia     pt states had too much xanax on board - agitated   . Anginal pain ([place])     "many many years ago"  . Exertional shortness of  breath   . Pneumonia, organism unspecified 11/07/2010    Assoc with R Parapneumonic effusion 09/2010    - Tapped 10/05/10    - CxR resolved 11/07/2010    . Headache     "monthly" (09/06/2014)  . Arthritis     "hips" (09/06/2014)  . Myocardial infarction ([place])   . S/P jejunostomy Feb 2016 07/26/2014   Allergies  Allergen Reactions  . Lithium Nausea And Vomiting  . Celebrex [Celecoxib] Other (See Comments)    History of bleeding ulcers  . Morphine Itching  . Quetiapine Other (See Comments)     tardive dyskinesia  . Varenicline Tartrate Other (See Comments)    hallucinations   Current Outpatient Prescriptions on File Prior to Visit  Medication Sig Dispense Refill  . ALPRAZolam (XANAX) 1 MG tablet Take 1 tablet (1 mg total) by mouth 3 (three) times daily as needed for anxiety. This is a 1 week supply 21 tablet 0  . Aspirin-Acetaminophen-Caffeine (GOODY HEADACHE PO) Take 1 packet by mouth 4 (four)  times daily as needed (pain).    . DULoxetine (CYMBALTA) 30 MG capsule Take 1 capsule (30 mg total) by mouth daily. 30 capsule 0  . ergocalciferol (VITAMIN D2) 50000 UNITS capsule Take 1 capsule (50,000 Units total) by mouth once a week. 24 capsule 0  . feeding supplement (BOOST / RESOURCE BREEZE) LIQD Take 1 Container by mouth 3 (three) times daily between meals. 90 Container 11  . furosemide (LASIX) 20 MG tablet Take 1 tablet (20 mg) by mouth with breakfast and/or lunch as needed for swelling 60 tablet 0  . HYDROcodone-acetaminophen (NORCO) 10-325 MG tablet Take 1-2 tablets by mouth every 6 (six) hours as needed for moderate pain. This is a 1 week supply. You will be due for a refill on Tuesday 10/25 60 tablet 0  . OLANZapine (ZYPREXA) 7.5 MG tablet Take 1 tablet (7.5 mg total) by mouth at bedtime. 30 tablet 1  . ondansetron (ZOFRAN ODT) 4 MG disintegrating tablet Take 1-2 tablets (4-8 mg total) by mouth every 8 (eight) hours as needed for nausea or vomiting. 180 tablet 0  . pantoprazole (PROTONIX) 40 MG tablet Take 1 tablet (40 mg total) by mouth 2 (two) times daily before a meal. 180 tablet 3  . polyethylene glycol powder (GLYCOLAX/MIRALAX) powder Take 17 g by mouth 2 (two) times daily as needed. 850 g 11  . potassium chloride SA (K-DUR,KLOR-CON) 20 MEQ tablet Take 1 tablet (20 mEq total) by mouth 2 (two) times daily. 60 tablet 0   No current facility-administered medications on file prior to visit.    Review of Systems  Constitutional: Negative for fever and chills.  Respiratory: Positive for cough, chest tightness, shortness of breath and wheezing.   Cardiovascular: Negative for chest pain and palpitations.  Genitourinary: Negative for dysuria, urgency and frequency.  Neurological: Negative for syncope.       Objective:  BP 128/76 mmHg  Pulse 80  Temp(Src) 99.6 F (37.6 C) (Oral)  Resp 16  Ht 5\' 5"  (1.651 m)  Wt 106 lb (48.081 kg)  BMI 17.64 kg/m2  SpO2 95%  Physical Exam    Constitutional: She is oriented to person, place, and time. She appears well-developed and well-nourished. No distress.  HENT:  Head: Normocephalic and atraumatic.  Eyes: Pupils are equal, round, and reactive to light.  Neck: Neck supple.  Cardiovascular: Normal rate.   Pulmonary/Chest: Effort normal. No respiratory distress.  Long expiratory phase with expiratory wheezing.   Musculoskeletal: Normal  range of motion.  Neurological: She is alert and oriented to person, place, and time. Coordination normal.  Skin: Skin is warm and dry. She is not diaphoretic.  Psychiatric: She has a normal mood and affect. Her behavior is normal.  Nursing note and vitals reviewed.      Assessment & Plan:   From (04/10/2015) lab note: Please let [place] know her vitamin D is getting better but is still much to low so she needs to continue taking her high dose vitamin D supplement twice a week. Her calcium is to low so she needs to to start a twice daily calcium/vitamin D supplement. She needs to choose a supplement that is CHEWABLE (like a gummy) calcium CITRATE supplement with 400 to 600 mg of calcium in it and has vitamin D in it - the more vit D the better. Do not take this at the same time as other calcium sources for maximum absorption. Look for an off-brand that is like "Citracal" or "Caltrate" which is made of the calcium citrate salt which you will be able to absorb better that the calcium CARBONATE products.   You also need to start miralax supplement twice a day. If you get diarrhea, you can decrease it to once a day. I sent a prescription for miralax to the [facility] - she can mix this into her coke.   Also, potassium is MUCH to low which is likely causing a lot of her leg pain - causes bad muscle cramps. She should be taking the potassium pill twice a day along with the furosemide (lasix). Whenever she takes a furosemide she needs to be taking a potassium pill at the same time. For the next  week, she should take a potassium 3x/d (breakfast, lunch, dinner) while only taking a lasix 2x/d (breakfast, lunch). 1. Chronic obstructive pulmonary disease with acute lower respiratory infection ([place])   2. Chronic pain syndrome   3. Venous stasis of both lower extremities     Orders Placed This Encounter  Procedures  . For home use only DME Nebulizer machine    Meds ordered this encounter  Medications  . albuterol (PROVENTIL) (2.5 MG/3ML) 0.083% nebulizer solution 2.5 mg    Sig:   . ipratropium (ATROVENT) nebulizer solution 0.5 mg    Sig:   . methylPREDNISolone acetate (DEPO-MEDROL) injection 80 mg    Sig:   . ketorolac (TORADOL) 30 MG/ML injection 30 mg    Sig:   . HYDROcodone-acetaminophen (NORCO) 10-325 MG tablet    Sig: Take 1-2 tablets by mouth every 6 (six) hours as needed for moderate pain. This is a 1 week supply. You will be due for a refill on Tuesday 11/1    Dispense:  60 tablet    Refill:  0    May fill Tuesday 10/15  . HYDROcodone-acetaminophen (NORCO) 10-325 MG tablet    Sig: Take 1-2 tablets by mouth every 6 (six) hours as needed for moderate pain. This is a 1 week supply. You will be due for a refill on Tuesday 11/1    Dispense:  64 tablet    Refill:  0    May fill today  . Calcium Citrate-Vitamin D (CALCIUM CITRATE CHEWY BITE) 500-500 MG-UNIT CHEW    Sig: Chew 1 tablet by mouth 2 (two) times daily.    Dispense:  180 tablet    Refill:  3  . ALPRAZolam (XANAX) 1 MG tablet    Sig: Take 1 tablet (1 mg total) by mouth 3 (three)  times daily as needed for anxiety. This is a 1 week supply    Dispense:  21 tablet    Refill:  0  . ipratropium-albuterol (DUONEB) 0.5-2.5 (3) MG/3ML SOLN    Sig: Take 3 mLs by nebulization 4 (four) times daily -  before meals and at bedtime.    Dispense:  360 mL    Refill:  3   Over 40 min spent in face-to-face evaluation of and consultation with patient and coordination of care.  Over 50% of this time was spent counseling this  patient.  I personally performed the services described in this documentation, which was scribed in my presence. The recorded information has been reviewed and considered, and addended by me as needed.  [name], MD MPH   By signing my name below, I, [name], attest that this documentation has been prepared under the direction and in the presence of [name], MD. Electronically Signed: [name], ER Scribe. 04/17/2015. 2:50 PM.

## 2015-04-17 NOTE — Patient Instructions (Addendum)
TAKE YOUR POTASSIUM SUPPLEMENT 3 TIMES A DAY - BREAKFAST, LUNCH, DINNER TAKE YOUR VITAMIN D TWICE A WEEK. YOU NEED TO SCHEDULE AN APPOINTMENT WITH THE NUTRITIONIST YOU NEED TO SCHEDULE AN APPOINTMENT WITH THE PSYCHIATRIST YOU NEED TO SIGN A RELEASE FOR YOUR RECORDS. Start the chewable calcium citrate with D twice a day If you don't hear from [facility] about your neb machine tomorrow call us.

## 2015-04-18 DIAGNOSIS — K279 Peptic ulcer, site unspecified, unspecified as acute or chronic, without hemorrhage or perforation: Secondary | ICD-10-CM | POA: Diagnosis not present

## 2015-04-18 DIAGNOSIS — R634 Abnormal weight loss: Secondary | ICD-10-CM | POA: Diagnosis not present

## 2015-04-18 DIAGNOSIS — E46 Unspecified protein-calorie malnutrition: Secondary | ICD-10-CM | POA: Diagnosis not present

## 2015-04-18 DIAGNOSIS — K2270 Barrett's esophagus without dysplasia: Secondary | ICD-10-CM | POA: Diagnosis not present

## 2015-04-18 DIAGNOSIS — J449 Chronic obstructive pulmonary disease, unspecified: Secondary | ICD-10-CM | POA: Diagnosis not present

## 2015-04-24 ENCOUNTER — Telehealth: Payer: Self-pay | Admitting: Family Medicine

## 2015-04-24 NOTE — Telephone Encounter (Signed)
Patient called back but it was her roommate we were trying to get ahold of

## 2015-04-25 ENCOUNTER — Ambulatory Visit (INDEPENDENT_AMBULATORY_CARE_PROVIDER_SITE_OTHER): Payer: Medicare Other | Admitting: Family Medicine

## 2015-04-25 VITALS — BP 112/70 | HR 88 | Temp 98.2°F | Resp 18 | Wt 96.6 lb

## 2015-04-25 DIAGNOSIS — G894 Chronic pain syndrome: Secondary | ICD-10-CM | POA: Diagnosis not present

## 2015-04-25 MED ORDER — HYDROCODONE-ACETAMINOPHEN 10-325 MG PO TABS: 1.0000 | ORAL_TABLET | Freq: Four times a day (QID) | ORAL | Status: DC | PRN

## 2015-04-25 MED ORDER — ALPRAZOLAM 1 MG PO TABS: 1.0000 mg | ORAL_TABLET | Freq: Three times a day (TID) | ORAL | Status: DC | PRN

## 2015-04-25 MED ORDER — DULOXETINE HCL 60 MG PO CPEP: 60.0000 mg | ORAL_CAPSULE | Freq: Every day | ORAL | Status: DC

## 2015-04-25 NOTE — Patient Instructions (Addendum)
--  DOUBLE UP YOUR CYMBALTA TO 2 TABS A DAY UNTIL YOU RUN OUT AND GET THE NEW ONE BACK TO ONCE A DAY. --STOP YOUR LASIX AND POTASSIUM FOR NOW. --KEEP TAKING YOUR VITAMIN D AT TWICE A WEEK. I will see you back on Tuesday 11/15 - We will do blood work. Keep taking your breeze supplements twice a day between meals.

## 2015-04-25 NOTE — Progress Notes (Addendum)
Subjective:  This chart was scribed for [name] Cheadle MD, by [name], at [facility].  This patient was seen in room  1  and the patient's care was started at 10:14 AM.    Patient ID: [name], female    DOB: 21-Jun-1951, 64 y.o.   MRN: [id number] Chief Complaint  Patient presents with  . Follow-up    with Dr. Brigitte Pulse    HPI  HPI Comments: [name] is a 64 y.o. female who presents to the [facility] for a follow up.   Patient notes that she feels "terrible" today.  She has an appointment with the (phsychiatrist)  next week and has not yet gotten a call back from the nutritionist.  She has been using her breathing treatment two times a day for the past few days.  She has not yet been contacted by home health. She has been doing her boost supplements (most days- 3 times a day).  She denies any issues with Cymbalta.  She is no longer taking her Lasix or Potassium.  She denies any issues with her bowel movements.  She is complaint with her vitamin D (once a week) and is willing to do it twice a week.  She ran out of her pain medication yesterday.  She is willing to follow up in two weeks.   Past Medical History  Diagnosis Date  . Peptic ulcer disease     dr Oletta Lamas  . Acute duodenal ulcer with hemorrhage and perforation, with obstruction ([place])   . Barrett's esophagus   . Menopause   . GERD (gastroesophageal reflux disease)   . Asthma   . Chronic abdominal pain     narcotic dependence, dr gyarteng-dak [facility]  . Narcotic abuse   . Anxiety     sees [name] NP at Dr. Radonna Ricker office  . Fx two ribs-open 08-24-11    left 4th and 5th  . Fx of fibula 07-28-11    left fibula, 2 places   . COPD (chronic obstructive pulmonary disease) ([place])   . Allergy   . Anemia   . History of blood transfusion     "related to OR; maybe when I perforated that ulcer"  . Lap Nissen + truncal vagotomy July 2008 05/13/2013  . LEG EDEMA  01/19/2008    Qualifier: Diagnosis of  By: [name] MD, [name]   . ACUTE DUODEN ULCER W/HEMORR PERF&OBSTRUCTION 06/26/2004    Qualifier: Diagnosis of  By: [name] MD, [name]   . FEVER, RECURRENT 08/11/2009    Qualifier: Diagnosis of  By: [name] MD, [name] MENOPAUSE 01/07/2007    Qualifier: Diagnosis of  By: [name], CMA, [place]    . Gastroparesis 06/03/2007    Qualifier: Diagnosis of  By: [name] MD, [name]   . GASTRIC OUTLET OBSTRUCTION 08/28/2007    In July 2008 she underwent laparoscopic enterolysis, Nissen fundoplication over a #40 bougie, single pledgeted suture colosure of the hiatus and a 3 suture wrap.  Lap truncal vagotomy and a loop gastrojejunostomy was performed.  This was revised in August of 2009 to a roux en Y gastrojejujostomy.     . 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 Depression   . History of hiatal hernia   . Complication of anesthesia     pt states had too much xanax on board - agitated   . Anginal pain ([place])     "  many many years ago"  . Exertional shortness of breath   . Pneumonia, organism unspecified 11/07/2010    Assoc with R Parapneumonic effusion 09/2010    - Tapped 10/05/10    - CxR resolved 11/07/2010    . Headache     "monthly" (09/06/2014)  . Arthritis     "hips" (09/06/2014)  . Myocardial infarction ([place])   . S/P jejunostomy Feb 2016 07/26/2014    Current Outpatient Prescriptions on File Prior to Visit  Medication Sig Dispense Refill  . ALPRAZolam (XANAX) 1 MG tablet Take 1 tablet (1 mg total) by mouth 3 (three) times daily as needed for anxiety. This is a 1 week supply 21 tablet 0  . Aspirin-Acetaminophen-Caffeine (GOODY HEADACHE PO) Take 1 packet by mouth 4 (four) times daily as needed (pain).    . Calcium Citrate-Vitamin D (CALCIUM CITRATE CHEWY BITE) 500-500 MG-UNIT CHEW Chew 1 tablet by mouth 2 (two) times daily. 180 tablet 3  . DULoxetine (CYMBALTA) 30 MG capsule Take 1 capsule (30 mg total) by mouth daily. 30 capsule 0  .  ergocalciferol (VITAMIN D2) 50000 UNITS capsule Take 1 capsule (50,000 Units total) by mouth once a week. 24 capsule 0  . feeding supplement (BOOST / RESOURCE BREEZE) LIQD Take 1 Container by mouth 3 (three) times daily between meals. 90 Container 11  . furosemide (LASIX) 20 MG tablet Take 1 tablet (20 mg) by mouth with breakfast and/or lunch as needed for swelling 60 tablet 0  . HYDROcodone-acetaminophen (NORCO) 10-325 MG tablet Take 1-2 tablets by mouth every 6 (six) hours as needed for moderate pain. This is a 1 week supply. You will be due for a refill on Tuesday 11/1 64 tablet 0  . ipratropium-albuterol (DUONEB) 0.5-2.5 (3) MG/3ML SOLN Take 3 mLs by nebulization 4 (four) times daily -  before meals and at bedtime. 360 mL 3  . OLANZapine (ZYPREXA) 7.5 MG tablet Take 1 tablet (7.5 mg total) by mouth at bedtime. 30 tablet 1  . ondansetron (ZOFRAN ODT) 4 MG disintegrating tablet Take 1-2 tablets (4-8 mg total) by mouth every 8 (eight) hours as needed for nausea or vomiting. 180 tablet 0  . pantoprazole (PROTONIX) 40 MG tablet Take 1 tablet (40 mg total) by mouth 2 (two) times daily before a meal. 180 tablet 3  . polyethylene glycol powder (GLYCOLAX/MIRALAX) powder Take 17 g by mouth 2 (two) times daily as needed. 850 g 11  . potassium chloride SA (K-DUR,KLOR-CON) 20 MEQ tablet Take 1 tablet (20 mEq total) by mouth 2 (two) times daily. 60 tablet 0   No current facility-administered medications on file prior to visit.    Allergies  Allergen Reactions  . Lithium Nausea And Vomiting  . Celebrex [Celecoxib] Other (See Comments)    History of bleeding ulcers  . Morphine Itching  . Quetiapine Other (See Comments)     tardive dyskinesia  . Varenicline Tartrate Other (See Comments)    hallucinations       Review of Systems  Constitutional: Negative for fever and chills.  Respiratory: Negative for cough.   Gastrointestinal: Negative for nausea and vomiting.  Musculoskeletal: Negative for neck  pain and neck stiffness.  Neurological: Negative for syncope and speech difficulty.       Objective:   Physical Exam  Constitutional: No distress.  HENT:  Head: Normocephalic and atraumatic.  Eyes: Pupils are equal, round, and reactive to light.  Neck: Normal range of motion.  Cardiovascular: Normal rate.  Exam reveals no friction  rub.   No murmur heard. Pulmonary/Chest: Effort normal and breath sounds normal. No respiratory distress. She has no wheezes. She has no rales.  Skin: Skin is warm and dry.   Filed Vitals:   04/25/15 0859  BP: 112/70  Pulse: 88  Temp: 98.2 F (36.8 C)  TempSrc: Oral  Resp: 18  Weight: 96 lb 9.6 oz (43.817 kg)  SpO2: 93%          Assessment & Plan:   1. Chronic pain syndrome   Rec repeat labs for K, alb, prealb, vit D, ca at next OV Last pth/ca 3 mos prior nml  Signed release for [facility] so we can send records to a new pain clinic so pt can establish as hydrocodone is not controlled pain but cannot tol nsaids, oxycodone, fentanyl - consider MS Contin in addition at f/u or Dilaudid rather than hydrocodone if pt does not get in w/ pain clinic soon.  Needs to sched appt w/ nutritionist - she was referred by surgical team while in hosp but they only called pt once to sched immed after her recent hosp and she no longer remembers what group/contact info if any that they gave her.  Has appt w/ new psych next week.  Increase cymbalata from 30 to 60 as pain and depression uncontrolled though clinically she does appear better and has only been on cymbalta for 2 wks.  Refilled xanax.  Meds ordered this encounter  Medications  . HYDROcodone-acetaminophen (NORCO) 10-325 MG tablet    Sig: Take 1-2 tablets by mouth every 6 (six) hours as needed for moderate pain. This is a 2 week supply. You will be due for a refill on Tuesday 11/15    Dispense:  120 tablet    Refill:  0    May fill today  . ALPRAZolam (XANAX) 1 MG tablet    Sig: Take 1 tablet  (1 mg total) by mouth 3 (three) times daily as needed for anxiety. This is a 1 week supply    Dispense:  42 tablet    Refill:  0  . DULoxetine (CYMBALTA) 60 MG capsule    Sig: Take 1 capsule (60 mg total) by mouth daily.    Dispense:  30 capsule    Refill:  0   Over 25 min spent in face-to-face evaluation of and consultation with patient and coordination of care.  Over 50% of this time was spent counseling this patient.   I personally performed the services described in this documentation, which was scribed in my presence. The recorded information has been reviewed and considered, and addended by me as needed.  [name] Cheadle, MD MPH

## 2015-04-28 ENCOUNTER — Other Ambulatory Visit: Payer: Self-pay

## 2015-04-28 DIAGNOSIS — M79605 Pain in left leg: Secondary | ICD-10-CM

## 2015-04-28 DIAGNOSIS — M79604 Pain in right leg: Secondary | ICD-10-CM

## 2015-04-28 DIAGNOSIS — I878 Other specified disorders of veins: Secondary | ICD-10-CM

## 2015-05-05 DIAGNOSIS — H353134 Nonexudative age-related macular degeneration, bilateral, advanced atrophic with subfoveal involvement: Secondary | ICD-10-CM | POA: Diagnosis not present

## 2015-05-05 DIAGNOSIS — T65221A Toxic effect of tobacco cigarettes, accidental (unintentional), initial encounter: Secondary | ICD-10-CM | POA: Diagnosis not present

## 2015-05-09 ENCOUNTER — Ambulatory Visit (INDEPENDENT_AMBULATORY_CARE_PROVIDER_SITE_OTHER): Payer: Medicare Other | Admitting: Family Medicine

## 2015-05-09 VITALS — BP 110/70 | HR 83 | Temp 98.3°F | Resp 16 | Ht 65.0 in | Wt 95.0 lb

## 2015-05-09 DIAGNOSIS — G894 Chronic pain syndrome: Secondary | ICD-10-CM | POA: Diagnosis not present

## 2015-05-09 DIAGNOSIS — E43 Unspecified severe protein-calorie malnutrition: Secondary | ICD-10-CM

## 2015-05-09 DIAGNOSIS — E876 Hypokalemia: Secondary | ICD-10-CM | POA: Diagnosis not present

## 2015-05-09 DIAGNOSIS — K5900 Constipation, unspecified: Secondary | ICD-10-CM | POA: Diagnosis not present

## 2015-05-09 DIAGNOSIS — K5909 Other constipation: Secondary | ICD-10-CM

## 2015-05-09 LAB — COMPREHENSIVE METABOLIC PANEL
ALK PHOS: 99 U/L (ref 33–130)
ALT: 9 U/L (ref 6–29)
AST: 16 U/L (ref 10–35)
Albumin: 3.5 g/dL — ABNORMAL LOW (ref 3.6–5.1)
BUN: 18 mg/dL (ref 7–25)
CALCIUM: 8.8 mg/dL (ref 8.6–10.4)
CO2: 29 mmol/L (ref 20–31)
Chloride: 102 mmol/L (ref 98–110)
Creat: 0.71 mg/dL (ref 0.50–0.99)
GLUCOSE: 83 mg/dL (ref 65–99)
POTASSIUM: 4.7 mmol/L (ref 3.5–5.3)
Sodium: 140 mmol/L (ref 135–146)
Total Bilirubin: 0.3 mg/dL (ref 0.2–1.2)
Total Protein: 6.2 g/dL (ref 6.1–8.1)

## 2015-05-09 MED ORDER — HYDROCODONE-ACETAMINOPHEN 10-325 MG PO TABS: 1.0000 | ORAL_TABLET | Freq: Four times a day (QID) | ORAL | Status: DC | PRN

## 2015-05-09 MED ORDER — DULOXETINE HCL 60 MG PO CPEP: 60.0000 mg | ORAL_CAPSULE | Freq: Every day | ORAL | Status: DC

## 2015-05-09 MED ORDER — OLANZAPINE 7.5 MG PO TABS: 7.5000 mg | ORAL_TABLET | Freq: Every day | ORAL | Status: DC

## 2015-05-09 MED ORDER — ALPRAZOLAM 1 MG PO TABS: 1.0000 mg | ORAL_TABLET | Freq: Three times a day (TID) | ORAL | Status: DC | PRN

## 2015-05-09 NOTE — Progress Notes (Addendum)
Subjective:    Patient ID: [name], female    DOB: [birth year], 64 y.o.   MRN: [id number] This chart was scribed for [name], MD by Zola Button, Medical Scribe. This patient was seen in Room 9 and the patient's care was started at 8:48 AM.   Chief Complaint  Patient presents with  . Medication Refill    hydrocodone, xanax    HPI HPI Comments: [name] is a 64 y.o. female with a history of chronic abdominal pain, depression, and anxiety who presents to the [facility] for a medication refill. She had an appointment with the psychiatrist last week, but was unable to make her appointment because she was ill. She has not yet rescheduled the appointment. Patient feels like her mood is improved overall and does not feel as depressed.   Ulceration, right leg: Her leg has been doing fine overall. She has not had any recent swelling.  Past Medical History  Diagnosis Date  . Peptic ulcer disease     dr Oletta Lamas  . Acute duodenal ulcer with hemorrhage and perforation, with obstruction ([place])   . Barrett's esophagus   . Menopause   . GERD (gastroesophageal reflux disease)   . Asthma   . Chronic abdominal pain     narcotic dependence, dr gyarteng-dak at [facility]  . Narcotic abuse   . Anxiety     sees [name] NP at Dr. Radonna Ricker office  . Fx two ribs-open 08-24-11    left 4th and 5th  . Fx of fibula 07-28-11    left fibula, 2 places   . COPD (chronic obstructive pulmonary disease) ([place])   . Allergy   . Anemia   . History of blood transfusion     "related to OR; maybe when I perforated that ulcer"  . Lap Nissen + truncal vagotomy July 2008 05/13/2013  . LEG EDEMA 01/19/2008    Qualifier: Diagnosis of  By: [name] MD, [name]   . ACUTE DUODEN ULCER W/HEMORR PERF&OBSTRUCTION 06/26/2004    Qualifier: Diagnosis of  By: [name] MD, [name]   . FEVER, RECURRENT 08/11/2009    Qualifier: Diagnosis of  By: [name] MD, [name] MENOPAUSE 01/07/2007   Qualifier: Diagnosis of  By: [name], CMA, [place]    . Gastroparesis 06/03/2007    Qualifier: Diagnosis of  By: [name] MD, [name]   . GASTRIC OUTLET OBSTRUCTION 08/28/2007    In July 2008 she underwent laparoscopic enterolysis, Nissen fundoplication over a [id number] bougie, single pledgeted suture colosure of the hiatus and a 3 suture wrap.  Lap truncal vagotomy and a loop gastrojejunostomy was performed.  This was revised in August of 2009 to a roux en Y gastrojejujostomy.     . 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 Depression   . History of hiatal hernia   . Complication of anesthesia     pt states had too much xanax on board - agitated   . Anginal pain ([place])     "many many years ago"  . Exertional shortness of breath   . Pneumonia, organism unspecified 11/07/2010    Assoc with R Parapneumonic effusion 09/2010    - Tapped 10/05/10    - CxR resolved 11/07/2010    . Headache     "monthly" (09/06/2014)  . Arthritis     "hips" (09/06/2014)  . Myocardial infarction ([place])   . S/P jejunostomy Feb 2016 07/26/2014  Past Surgical History  Procedure Laterality Date  . Esophagogastroduodenoscopy  12-29-09    dr qadeer at [facility], several gastric ulcers  . Laparoscopic nissen fundoplication    . Gastrojejunostomy  02-20-08    Roux en Y gastrojejunsotomy w 1 foot Roux limb  . Repair of perforated ulcer    . Biopsy thyroid  08-13-11    benign nodule, per Dr. Melida Quitter   . Esophagogastroduodenoscopy N/A 09/25/2012    Procedure: ESOPHAGOGASTRODUODENOSCOPY (EGD);  Surgeon: Beryle Beams, MD;  Location: [facility];  Service: Endoscopy;  Laterality: N/A;  . Fracture surgery    . Tonsillectomy    . Cataract extraction w/ intraocular lens  implant, bilateral Bilateral   . Laparoscopic partial gastrectomy N/A 06/29/2013    Procedure: Gastrectomy and GJ Tube placement;  Surgeon: Pedro Earls, MD;  Location: [facility];  Service: General;  Laterality: N/A;  . Colonoscopy    . Gastrostomy  N/A 07/26/2014    Procedure: LAPRASCOPIC ASSISTED OPEN PLACEMENT OF JEJUNOSTOMY TUBE;  Surgeon: Pedro Earls, MD;  Location: [facility];  Service: General;  Laterality: N/A;  . Orif hip fracture Bilateral     "pins in both of them"  . Dilation and curettage of uterus    . Hernia repair      "hiatal"  . Joint replacement     Current Outpatient Prescriptions on File Prior to Visit  Medication Sig Dispense Refill  . Aspirin-Acetaminophen-Caffeine (GOODY HEADACHE PO) Take 1 packet by mouth 4 (four) times daily as needed (pain).    . Calcium Citrate-Vitamin D (CALCIUM CITRATE CHEWY BITE) 500-500 MG-UNIT CHEW Chew 1 tablet by mouth 2 (two) times daily. 180 tablet 3  . ergocalciferol (VITAMIN D2) 50000 UNITS capsule Take 1 capsule (50,000 Units total) by mouth once a week. 24 capsule 0  . feeding supplement (BOOST / RESOURCE BREEZE) LIQD Take 1 Container by mouth 3 (three) times daily between meals. 90 Container 11  . furosemide (LASIX) 20 MG tablet Take 1 tablet (20 mg) by mouth with breakfast and/or lunch as needed for swelling 60 tablet 0  . ipratropium-albuterol (DUONEB) 0.5-2.5 (3) MG/3ML SOLN Take 3 mLs by nebulization 4 (four) times daily -  before meals and at bedtime. 360 mL 3  . ondansetron (ZOFRAN ODT) 4 MG disintegrating tablet Take 1-2 tablets (4-8 mg total) by mouth every 8 (eight) hours as needed for nausea or vomiting. 180 tablet 0  . pantoprazole (PROTONIX) 40 MG tablet Take 1 tablet (40 mg total) by mouth 2 (two) times daily before a meal. 180 tablet 3  . polyethylene glycol powder (GLYCOLAX/MIRALAX) powder Take 17 g by mouth 2 (two) times daily as needed. 850 g 11  . potassium chloride SA (K-DUR,KLOR-CON) 20 MEQ tablet Take 1 tablet (20 mEq total) by mouth 2 (two) times daily. 60 tablet 0   No current facility-administered medications on file prior to visit.   Allergies  Allergen Reactions  . Lithium Nausea And Vomiting  . Celebrex [Celecoxib] Other (See Comments)    History of  bleeding ulcers  . Morphine Itching  . Quetiapine Other (See Comments)     tardive dyskinesia  . Varenicline Tartrate Other (See Comments)    hallucinations   Family History  Problem Relation Age of Onset  . Cancer Mother     kidney, magliant tumor on face  . Stroke Mother   . Celiac disease Father   . Cancer Father     kidney and pancreatic   Social History  Social History  . Marital Status: Divorced    Spouse Name: N/A  . Number of Children: N/A  . Years of Education: N/A   Social History Main Topics  . Smoking status: Current Every Day Smoker -- 2.00 packs/day for 50 years    Types: Cigarettes  . Smokeless tobacco: Never Used  . Alcohol Use: No  . Drug Use: No  . Sexual Activity: No   Other Topics Concern  . None   Social History Narrative    Review of Systems  Constitutional: Positive for fatigue. Negative for fever, chills, activity change, appetite change and unexpected weight change.  Respiratory: Positive for cough. Negative for chest tightness, shortness of breath and wheezing.   Cardiovascular: Negative for leg swelling.  Gastrointestinal: Positive for nausea, abdominal pain and constipation. Negative for vomiting, diarrhea and abdominal distention.  Musculoskeletal: Positive for myalgias, back pain, arthralgias and gait problem. Negative for joint swelling.  Skin: Positive for wound.  Allergic/Immunologic: Negative for food allergies.  Neurological: Positive for weakness. Negative for numbness.  Hematological: Bruises/bleeds easily.  Psychiatric/Behavioral: Positive for behavioral problems, confusion, dysphoric mood, decreased concentration and agitation. Negative for suicidal ideas, hallucinations and sleep disturbance. The patient is nervous/anxious. The patient is not hyperactive.        Objective:  BP 110/70 mmHg  Pulse 83  Temp(Src) 98.3 F (36.8 C) (Oral)  Resp 16  Ht 5\' 5"  (1.651 m)  Wt 95 lb (43.092 kg)  BMI 15.81 kg/m2  SpO2 86%   Physical Exam  Constitutional: She is oriented to person, place, and time. She appears well-developed and well-nourished. No distress.  HENT:  Head: Normocephalic and atraumatic.  Mouth/Throat: Oropharynx is clear and moist. No oropharyngeal exudate.  Eyes: Pupils are equal, round, and reactive to light.  Neck: Neck supple.  Cardiovascular: Normal rate.   Pulmonary/Chest: Effort normal.  Musculoskeletal: She exhibits no edema.  Neurological: She is alert and oriented to person, place, and time. No cranial nerve deficit.  Skin: Skin is warm and dry. No rash noted.  1.5 cm x 0.5 cm area of superficial ulceration with sloughed yellow necrotic drainage surrounded by macerated border approximately 0.5 cm, but healthy pink granulated tissue around the base; right lower lateral ankle.  Psychiatric: She has a normal mood and affect. Her behavior is normal.  Nursing note and vitals reviewed.         Assessment & Plan:   She has an appointment in 1 month with vascular for arterial dopplers and chronic ulcerations.   She is still taking once weekly vitamin D. Needs to start a daily iron supplement as well as chewable calcium supplement.   UDS one month previously was appropriate.   1. Hypokalemia - She is no longer taking the Lasix, but is still taking potassium so STOP both - only take K with lasix - can take 3d course if swelling recurs  2. Protein-calorie malnutrition, severe ([place]) - She has an appointment with nutritionist on December 9th, 10:15 AM - bring food log with her  3. Chronic pain syndrome - Pt will f/u with me on 12/15 appt for additional medication refills.   4. Chronic constipation - start miralax qd-bid  Orders Placed This Encounter  Procedures  . Comprehensive metabolic panel    Meds ordered this encounter  Medications  . OLANZapine (ZYPREXA) 7.5 MG tablet    Sig: Take 1 tablet (7.5 mg total) by mouth at bedtime.    Dispense:  30 tablet    Refill:  1    Failed  risperdal, cannot tolerate seroquel due to tardive dyskinesia  . ALPRAZolam (XANAX) 1 MG tablet    Sig: Take 1 tablet (1 mg total) by mouth 3 (three) times daily as needed for anxiety. This is a 1 month supply    Dispense:  90 tablet    Refill:  0  . DULoxetine (CYMBALTA) 60 MG capsule    Sig: Take 1 capsule (60 mg total) by mouth daily.    Dispense:  30 capsule    Refill:  0  . HYDROcodone-acetaminophen (NORCO) 10-325 MG tablet    Sig: Take 1-2 tablets by mouth every 6 (six) hours as needed for moderate pain. This is a 1 month supply. You will be due for a refill on 12/15    Dispense:  240 tablet    Refill:  0    May fill today    I personally performed the services described in this documentation, which was scribed in my presence. The recorded information has been reviewed and considered, and addended by me as needed.  [name], MD MPH   By signing my name below, I, Zola Button, attest that this documentation has been prepared under the direction and in the presence of [name], MD.  Electronically Signed: Zola Button, Medical Scribe. 05/09/2015. 8:48 AM.

## 2015-05-09 NOTE — Patient Instructions (Signed)
Please come to next door scheduled clinic at 8 a.m. For your appointment with me for medication refill on December 15th. Start a chewable calcium supplement. Start a daily iron supplement. Continue the once weekly vitamin D supplement. Drink miralax at least once a day if not twice a day. You HAVE to keep you appointment with nutrition on Dec 9 and bring the diet log with you that you have kept. Do not take the potassium or lasix/fureosemide fluid pill for now - if you get swelling recurring, you can restart these.

## 2015-05-12 DIAGNOSIS — I872 Venous insufficiency (chronic) (peripheral): Secondary | ICD-10-CM | POA: Diagnosis not present

## 2015-05-12 DIAGNOSIS — T4395XS Adverse effect of unspecified psychotropic drug, sequela: Secondary | ICD-10-CM | POA: Diagnosis not present

## 2015-05-12 DIAGNOSIS — G894 Chronic pain syndrome: Secondary | ICD-10-CM | POA: Diagnosis not present

## 2015-05-12 DIAGNOSIS — K279 Peptic ulcer, site unspecified, unspecified as acute or chronic, without hemorrhage or perforation: Secondary | ICD-10-CM | POA: Diagnosis not present

## 2015-05-12 DIAGNOSIS — F331 Major depressive disorder, recurrent, moderate: Secondary | ICD-10-CM | POA: Diagnosis not present

## 2015-05-12 DIAGNOSIS — I10 Essential (primary) hypertension: Secondary | ICD-10-CM | POA: Diagnosis not present

## 2015-05-12 DIAGNOSIS — F1123 Opioid dependence with withdrawal: Secondary | ICD-10-CM | POA: Diagnosis not present

## 2015-05-12 DIAGNOSIS — J449 Chronic obstructive pulmonary disease, unspecified: Secondary | ICD-10-CM | POA: Diagnosis not present

## 2015-05-12 DIAGNOSIS — M810 Age-related osteoporosis without current pathological fracture: Secondary | ICD-10-CM | POA: Diagnosis not present

## 2015-05-12 DIAGNOSIS — J45909 Unspecified asthma, uncomplicated: Secondary | ICD-10-CM | POA: Diagnosis not present

## 2015-05-12 DIAGNOSIS — Z4800 Encounter for change or removal of nonsurgical wound dressing: Secondary | ICD-10-CM | POA: Diagnosis not present

## 2015-05-12 DIAGNOSIS — M160 Bilateral primary osteoarthritis of hip: Secondary | ICD-10-CM | POA: Diagnosis not present

## 2015-05-12 DIAGNOSIS — Z9181 History of falling: Secondary | ICD-10-CM | POA: Diagnosis not present

## 2015-05-12 DIAGNOSIS — L97811 Non-pressure chronic ulcer of other part of right lower leg limited to breakdown of skin: Secondary | ICD-10-CM | POA: Diagnosis not present

## 2015-05-12 DIAGNOSIS — G2401 Drug induced subacute dyskinesia: Secondary | ICD-10-CM | POA: Diagnosis not present

## 2015-05-15 ENCOUNTER — Encounter: Payer: Self-pay | Admitting: Family Medicine

## 2015-05-16 ENCOUNTER — Telehealth: Payer: Self-pay

## 2015-05-16 NOTE — Telephone Encounter (Signed)
Helene Kelp from [facility] called to ask for authorization to have OT and Med Soc Worker eval. I OKd the consults to see if services are needed. Dr Brigitte Pulse, [place] [place].

## 2015-05-16 NOTE — Telephone Encounter (Signed)
Perfect! thank you so much!!

## 2015-05-19 DIAGNOSIS — J449 Chronic obstructive pulmonary disease, unspecified: Secondary | ICD-10-CM | POA: Diagnosis not present

## 2015-05-19 DIAGNOSIS — L97811 Non-pressure chronic ulcer of other part of right lower leg limited to breakdown of skin: Secondary | ICD-10-CM | POA: Diagnosis not present

## 2015-05-19 DIAGNOSIS — M810 Age-related osteoporosis without current pathological fracture: Secondary | ICD-10-CM | POA: Diagnosis not present

## 2015-05-19 DIAGNOSIS — F1123 Opioid dependence with withdrawal: Secondary | ICD-10-CM | POA: Diagnosis not present

## 2015-05-19 DIAGNOSIS — I872 Venous insufficiency (chronic) (peripheral): Secondary | ICD-10-CM | POA: Diagnosis not present

## 2015-05-19 DIAGNOSIS — J45909 Unspecified asthma, uncomplicated: Secondary | ICD-10-CM | POA: Diagnosis not present

## 2015-05-19 DIAGNOSIS — I10 Essential (primary) hypertension: Secondary | ICD-10-CM | POA: Diagnosis not present

## 2015-05-19 DIAGNOSIS — Z4800 Encounter for change or removal of nonsurgical wound dressing: Secondary | ICD-10-CM | POA: Diagnosis not present

## 2015-05-19 DIAGNOSIS — F331 Major depressive disorder, recurrent, moderate: Secondary | ICD-10-CM | POA: Diagnosis not present

## 2015-05-19 DIAGNOSIS — Z9181 History of falling: Secondary | ICD-10-CM | POA: Diagnosis not present

## 2015-05-19 DIAGNOSIS — R634 Abnormal weight loss: Secondary | ICD-10-CM | POA: Diagnosis not present

## 2015-05-19 DIAGNOSIS — G2401 Drug induced subacute dyskinesia: Secondary | ICD-10-CM | POA: Diagnosis not present

## 2015-05-19 DIAGNOSIS — K279 Peptic ulcer, site unspecified, unspecified as acute or chronic, without hemorrhage or perforation: Secondary | ICD-10-CM | POA: Diagnosis not present

## 2015-05-19 DIAGNOSIS — T4395XS Adverse effect of unspecified psychotropic drug, sequela: Secondary | ICD-10-CM | POA: Diagnosis not present

## 2015-05-19 DIAGNOSIS — M160 Bilateral primary osteoarthritis of hip: Secondary | ICD-10-CM | POA: Diagnosis not present

## 2015-05-19 DIAGNOSIS — G894 Chronic pain syndrome: Secondary | ICD-10-CM | POA: Diagnosis not present

## 2015-05-23 DIAGNOSIS — G2401 Drug induced subacute dyskinesia: Secondary | ICD-10-CM | POA: Diagnosis not present

## 2015-05-23 DIAGNOSIS — M160 Bilateral primary osteoarthritis of hip: Secondary | ICD-10-CM | POA: Diagnosis not present

## 2015-05-23 DIAGNOSIS — J45909 Unspecified asthma, uncomplicated: Secondary | ICD-10-CM | POA: Diagnosis not present

## 2015-05-23 DIAGNOSIS — J449 Chronic obstructive pulmonary disease, unspecified: Secondary | ICD-10-CM | POA: Diagnosis not present

## 2015-05-23 DIAGNOSIS — M810 Age-related osteoporosis without current pathological fracture: Secondary | ICD-10-CM | POA: Diagnosis not present

## 2015-05-23 DIAGNOSIS — K279 Peptic ulcer, site unspecified, unspecified as acute or chronic, without hemorrhage or perforation: Secondary | ICD-10-CM | POA: Diagnosis not present

## 2015-05-23 DIAGNOSIS — L97811 Non-pressure chronic ulcer of other part of right lower leg limited to breakdown of skin: Secondary | ICD-10-CM | POA: Diagnosis not present

## 2015-05-23 DIAGNOSIS — F331 Major depressive disorder, recurrent, moderate: Secondary | ICD-10-CM | POA: Diagnosis not present

## 2015-05-23 DIAGNOSIS — I10 Essential (primary) hypertension: Secondary | ICD-10-CM | POA: Diagnosis not present

## 2015-05-23 DIAGNOSIS — G894 Chronic pain syndrome: Secondary | ICD-10-CM | POA: Diagnosis not present

## 2015-05-23 DIAGNOSIS — Z9181 History of falling: Secondary | ICD-10-CM | POA: Diagnosis not present

## 2015-05-23 DIAGNOSIS — Z4800 Encounter for change or removal of nonsurgical wound dressing: Secondary | ICD-10-CM | POA: Diagnosis not present

## 2015-05-23 DIAGNOSIS — I872 Venous insufficiency (chronic) (peripheral): Secondary | ICD-10-CM | POA: Diagnosis not present

## 2015-05-23 DIAGNOSIS — F1123 Opioid dependence with withdrawal: Secondary | ICD-10-CM | POA: Diagnosis not present

## 2015-05-23 DIAGNOSIS — T4395XS Adverse effect of unspecified psychotropic drug, sequela: Secondary | ICD-10-CM | POA: Diagnosis not present

## 2015-05-23 NOTE — Progress Notes (Signed)
Subjective:    Patient ID: [name], female    DOB: 1950/06/26, 64 y.o.   MRN: [id number] Chief Complaint  Patient presents with  . Medication Refill    mirtazapine, diazepam, furosemide  . depression screen 16    HPI Past Medical History  Diagnosis Date  . Peptic ulcer disease     dr Oletta Lamas  . Acute duodenal ulcer with hemorrhage and perforation, with obstruction ([place])   . Barrett's esophagus   . Menopause   . GERD (gastroesophageal reflux disease)   . Asthma   . Chronic abdominal pain     narcotic dependence, dr gyarteng-dak at [facility]  . Narcotic abuse   . Anxiety     sees [name] NP at Dr. Radonna Ricker office  . Fx two ribs-open 08-24-11    left 4th and 5th  . Fx of fibula 07-28-11    left fibula, 2 places   . COPD (chronic obstructive pulmonary disease) ([place])   . Allergy   . Anemia   . History of blood transfusion     "related to OR; maybe when I perforated that ulcer"  . Lap Nissen + truncal vagotomy July 2008 05/13/2013  . LEG EDEMA 01/19/2008    Qualifier: Diagnosis of  By: [name] MD, [name]   . ACUTE DUODEN ULCER W/HEMORR PERF&OBSTRUCTION 06/26/2004    Qualifier: Diagnosis of  By: [name] MD, [name]   . FEVER, RECURRENT 08/11/2009    Qualifier: Diagnosis of  By: [name] MD, [name] MENOPAUSE 01/07/2007    Qualifier: Diagnosis of  By: [name], CMA, [place]    . Gastroparesis 06/03/2007    Qualifier: Diagnosis of  By: [name] MD, [name]   . GASTRIC OUTLET OBSTRUCTION 08/28/2007    In July 2008 she underwent laparoscopic enterolysis, Nissen fundoplication over a [id number] bougie, single pledgeted suture colosure of the hiatus and a 3 suture wrap.  Lap truncal vagotomy and a loop gastrojejunostomy was performed.  This was revised in August of 2009 to a roux en Y gastrojejujostomy.     . 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 Depression   . History of hiatal hernia   . Complication of anesthesia     pt states had too much xanax on  board - agitated   . Anginal pain ([place])     "many many years ago"  . Exertional shortness of breath   . Pneumonia, organism unspecified 11/07/2010    Assoc with R Parapneumonic effusion 09/2010    - Tapped 10/05/10    - CxR resolved 11/07/2010    . Headache     "monthly" (09/06/2014)  . Arthritis     "hips" (09/06/2014)  . Myocardial infarction ([place])   . S/P jejunostomy Feb 2016 07/26/2014   Current Outpatient Prescriptions on File Prior to Visit  Medication Sig Dispense Refill  . ergocalciferol (VITAMIN D2) 50000 UNITS capsule Take 1 capsule (50,000 Units total) by mouth once a week. 24 capsule 0  . pantoprazole (PROTONIX) 40 MG tablet Take 1 tablet (40 mg total) by mouth 2 (two) times daily before a meal. 180 tablet 3   No current facility-administered medications on file prior to visit.   Allergies  Allergen Reactions  . Lithium Nausea And Vomiting  . Celebrex [Celecoxib] Other (See Comments)    History of bleeding ulcers  . Morphine Itching  . Quetiapine Other (See Comments)     tardive dyskinesia  . Varenicline  Tartrate Other (See Comments)    hallucinations      Review of Systems  Constitutional: Negative for fever, chills, activity change, appetite change, fatigue and unexpected weight change.  Respiratory: Negative for shortness of breath.   Cardiovascular: Positive for leg swelling. Negative for chest pain.  Gastrointestinal: Positive for nausea and abdominal pain. Negative for vomiting, abdominal distention and anal bleeding.  Genitourinary: Negative for dysuria, decreased urine volume and difficulty urinating.  Musculoskeletal: Positive for myalgias, back pain, joint swelling and arthralgias.  Skin: Negative for rash.  Neurological: Positive for weakness. Negative for syncope.  Hematological: Negative for adenopathy. Bruises/bleeds easily.  Psychiatric/Behavioral: Positive for behavioral problems, confusion, dysphoric mood, decreased concentration and agitation.  Negative for suicidal ideas, hallucinations, sleep disturbance and self-injury. The patient is nervous/anxious. The patient is not hyperactive.        Objective:  BP 120/78 mmHg  Pulse 118  Temp(Src) 99.2 F (37.3 C) (Oral)  Resp 16  Wt 99 lb (44.906 kg)  Physical Exam  Constitutional: She is oriented to person, place, and time. She appears well-developed and well-nourished. No distress.  HENT:  Head: Normocephalic and atraumatic.  Right Ear: External ear normal.  Left Ear: External ear normal.  Eyes: Conjunctivae are normal. No scleral icterus.  Neck: Normal range of motion. Neck supple. No thyromegaly present.  Cardiovascular: Normal rate, regular rhythm, normal heart sounds and intact distal pulses.   Pulmonary/Chest: Effort normal and breath sounds normal. No respiratory distress.  Musculoskeletal: She exhibits no edema.  Lymphadenopathy:    She has no cervical adenopathy.  Neurological: She is alert and oriented to person, place, and time.  Skin: Skin is warm and dry. She is not diaphoretic. No erythema.  Psychiatric: She has a normal mood and affect. Her speech is normal and behavior is normal. Thought content normal. Cognition and memory are impaired. She exhibits abnormal recent memory.          Assessment & Plan:   1. Visit for screening mammogram   2. 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 Atherosclerotic peripheral vascular disease with intermittent claudication ([place])     Orders Placed This Encounter  Procedures  . MM Digital Diagnostic Bilat    Standing Status: Future     Number of Occurrences:      Standing Expiration Date: 05/29/2016    Scheduling Instructions:     Patient will schedule mammogram    Order Specific Question:  Reason for Exam (SYMPTOM  OR DIAGNOSIS REQUIRED)    Answer:  annual screening    Order Specific Question:  Preferred imaging location?    Answer:  External  . Ambulatory  referral to Vascular Surgery    Referral Priority:  Routine    Referral Type:  Surgical    Referral Reason:  Specialty Services Required    Requested Specialty:  Vascular Surgery    Number of Visits Requested:  1    Meds ordered this encounter  Medications  .  furosemide (LASIX) 20 MG tablet    Sig: Take 1 tablet (20 mg total) by mouth 2 (two) times daily. Take 1 with breakfast and 1 with lunch as needed for leg swelling    Dispense:  60 tablet    Refill:  0  . potassium chloride SA (K-DUR,KLOR-CON) 20 MEQ tablet    Sig: Take 1 tablet (20 mEq total) by mouth daily.    Dispense:  30 tablet    Refill:  0  . ondansetron (ZOFRAN ODT) 4 MG disintegrating tablet    Sig: Take 1-2 tablets (4-8 mg total) by mouth every 8 (eight) hours as needed for nausea or vomiting.    Dispense:  180 tablet    Refill:  0    [name], MD MPH

## 2015-05-25 ENCOUNTER — Telehealth: Payer: Self-pay

## 2015-05-25 DIAGNOSIS — T4395XS Adverse effect of unspecified psychotropic drug, sequela: Secondary | ICD-10-CM | POA: Diagnosis not present

## 2015-05-25 DIAGNOSIS — L97811 Non-pressure chronic ulcer of other part of right lower leg limited to breakdown of skin: Secondary | ICD-10-CM | POA: Diagnosis not present

## 2015-05-25 DIAGNOSIS — G894 Chronic pain syndrome: Secondary | ICD-10-CM | POA: Diagnosis not present

## 2015-05-25 DIAGNOSIS — F1123 Opioid dependence with withdrawal: Secondary | ICD-10-CM | POA: Diagnosis not present

## 2015-05-25 DIAGNOSIS — Z9181 History of falling: Secondary | ICD-10-CM | POA: Diagnosis not present

## 2015-05-25 DIAGNOSIS — M160 Bilateral primary osteoarthritis of hip: Secondary | ICD-10-CM | POA: Diagnosis not present

## 2015-05-25 DIAGNOSIS — J45909 Unspecified asthma, uncomplicated: Secondary | ICD-10-CM | POA: Diagnosis not present

## 2015-05-25 DIAGNOSIS — F331 Major depressive disorder, recurrent, moderate: Secondary | ICD-10-CM | POA: Diagnosis not present

## 2015-05-25 DIAGNOSIS — M810 Age-related osteoporosis without current pathological fracture: Secondary | ICD-10-CM | POA: Diagnosis not present

## 2015-05-25 DIAGNOSIS — I872 Venous insufficiency (chronic) (peripheral): Secondary | ICD-10-CM | POA: Diagnosis not present

## 2015-05-25 DIAGNOSIS — I10 Essential (primary) hypertension: Secondary | ICD-10-CM | POA: Diagnosis not present

## 2015-05-25 DIAGNOSIS — G2401 Drug induced subacute dyskinesia: Secondary | ICD-10-CM | POA: Diagnosis not present

## 2015-05-25 DIAGNOSIS — J449 Chronic obstructive pulmonary disease, unspecified: Secondary | ICD-10-CM | POA: Diagnosis not present

## 2015-05-25 DIAGNOSIS — Z4800 Encounter for change or removal of nonsurgical wound dressing: Secondary | ICD-10-CM | POA: Diagnosis not present

## 2015-05-25 DIAGNOSIS — K279 Peptic ulcer, site unspecified, unspecified as acute or chronic, without hemorrhage or perforation: Secondary | ICD-10-CM | POA: Diagnosis not present

## 2015-05-25 NOTE — Telephone Encounter (Signed)
Advise on approval.

## 2015-05-25 NOTE — Telephone Encounter (Addendum)
OT - Renee would like to talk with Dr. Brigitte Pulse regarding orders for this patient  [phone]

## 2015-05-25 NOTE — Telephone Encounter (Signed)
Social worker - request to go back and see patient 1 x week for 2 weeks, and do advance directives.   Also, needs emotional care - nurse there only two weeks.   [phone]

## 2015-05-25 NOTE — Telephone Encounter (Signed)
Yes please,   I'm not sure what they want to do about the emotional care or what they meant by this - mistype? - I do agree and have been hounding the pt relentlessly to go to therapy or psychiatry or both and she has scheduled 2 appts and had to miss each but if they have some way to provide emotional care or recs that would be amazing. . . .

## 2015-05-26 DIAGNOSIS — J45909 Unspecified asthma, uncomplicated: Secondary | ICD-10-CM | POA: Diagnosis not present

## 2015-05-26 DIAGNOSIS — G2401 Drug induced subacute dyskinesia: Secondary | ICD-10-CM | POA: Diagnosis not present

## 2015-05-26 DIAGNOSIS — K279 Peptic ulcer, site unspecified, unspecified as acute or chronic, without hemorrhage or perforation: Secondary | ICD-10-CM | POA: Diagnosis not present

## 2015-05-26 DIAGNOSIS — I10 Essential (primary) hypertension: Secondary | ICD-10-CM | POA: Diagnosis not present

## 2015-05-26 DIAGNOSIS — F331 Major depressive disorder, recurrent, moderate: Secondary | ICD-10-CM | POA: Diagnosis not present

## 2015-05-26 DIAGNOSIS — Z9181 History of falling: Secondary | ICD-10-CM | POA: Diagnosis not present

## 2015-05-26 DIAGNOSIS — J449 Chronic obstructive pulmonary disease, unspecified: Secondary | ICD-10-CM | POA: Diagnosis not present

## 2015-05-26 DIAGNOSIS — M160 Bilateral primary osteoarthritis of hip: Secondary | ICD-10-CM | POA: Diagnosis not present

## 2015-05-26 DIAGNOSIS — Z4800 Encounter for change or removal of nonsurgical wound dressing: Secondary | ICD-10-CM | POA: Diagnosis not present

## 2015-05-26 DIAGNOSIS — I872 Venous insufficiency (chronic) (peripheral): Secondary | ICD-10-CM | POA: Diagnosis not present

## 2015-05-26 DIAGNOSIS — M810 Age-related osteoporosis without current pathological fracture: Secondary | ICD-10-CM | POA: Diagnosis not present

## 2015-05-26 DIAGNOSIS — G894 Chronic pain syndrome: Secondary | ICD-10-CM | POA: Diagnosis not present

## 2015-05-26 DIAGNOSIS — L97811 Non-pressure chronic ulcer of other part of right lower leg limited to breakdown of skin: Secondary | ICD-10-CM | POA: Diagnosis not present

## 2015-05-26 DIAGNOSIS — F1123 Opioid dependence with withdrawal: Secondary | ICD-10-CM | POA: Diagnosis not present

## 2015-05-26 DIAGNOSIS — T4395XS Adverse effect of unspecified psychotropic drug, sequela: Secondary | ICD-10-CM | POA: Diagnosis not present

## 2015-05-26 NOTE — Telephone Encounter (Signed)
Left message with verbal order. 

## 2015-05-26 NOTE — Telephone Encounter (Signed)
Joseph Art is calling to follow up on the message left yesterday. Please call!

## 2015-05-31 ENCOUNTER — Ambulatory Visit (INDEPENDENT_AMBULATORY_CARE_PROVIDER_SITE_OTHER): Payer: Medicare Other | Admitting: Physician Assistant

## 2015-05-31 VITALS — BP 104/64 | HR 103 | Temp 98.4°F | Resp 17 | Ht 65.0 in | Wt 93.0 lb

## 2015-05-31 DIAGNOSIS — R5383 Other fatigue: Secondary | ICD-10-CM | POA: Diagnosis not present

## 2015-05-31 DIAGNOSIS — R636 Underweight: Secondary | ICD-10-CM | POA: Diagnosis not present

## 2015-05-31 MED ORDER — BOOST / RESOURCE BREEZE PO LIQD: 1.0000 | Freq: Three times a day (TID) | ORAL | Status: DC

## 2015-05-31 NOTE — Progress Notes (Signed)
[facility] [address], [place] [address] [phone]  Date:  05/31/2015   Name:  [name]   DOB:  [birth year]   MRN:  [id number]  PCP:  [name], MD   Chief Complaint  Patient presents with  . Dehydration  . Weight Loss    History of Present Illness:  [name] is a 64 y.o. female patient who presents to [facility] for cc of weakness and lethargy.   Patient reports that she is having weight loss at this time, and at this time is at a down side.  She is drinking shakes prescribed.  She reports that she is eating normally.  Foods consist of meat and vegetables, and chik fil a.  Generally do not cook at home.  She has no abnormal abdominal pain.  Nausea is a not abnormal for her, and it is not exacerbated at this time.  She has had no need for Zofran.  No emesis.  No urinary frequency.  She is hydrating very little.  She may double-fist coke and coffee in the morning, and drinks coke throughout the day.  No dizziness, chest pains, or palpitations. bm is normal without blood or darkened stool.     Also requesting refill of her norco and xanax.  Patient was given 240 of the norco tablets 05/09/2015  (almost 3 weeks ago).  90 tablets of the xanax were given.  They report that they are almost out.  Though, do not have medications.  Patient and roommate report that they have a friend that comes over that may have taken some of the medication.  Roommate also states that she took a couple last week for some knee pain.    Patient Active Problem List   Diagnosis Date Noted  . Abdominal pain   . Hypoxia 04/08/2015  . Opioid use with withdrawal ([place])   . Protein-calorie malnutrition ([place])   . Episode of recurrent major depressive disorder ([place])   . Secondary hyperparathyroidism ([place]) 01/10/2015  . Vitamin D deficiency 01/10/2015  . 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 Anemia, iron deficiency 11/03/2014  . Chronic pain syndrome 10/24/2014  .  Osteoporosis 10/05/2014  . Vertebral compression fracture ([place]) 10/05/2014  . Coronary artery disease due to calcified coronary lesion 10/05/2014  . Weight loss   . Tobacco abuse   . History of subtotal gastrectomy with Roux-en-Y recontruction   . Orthostatic hypotension 08/11/2014  . Severe protein-calorie malnutrition ([place]) 12/30/2013  . Constipation, chronic 07/10/2013  . Chronic abdominal pain   . COPD (chronic obstructive pulmonary disease) ([place]) 11/07/2010  . Major depressive disorder, recurrent episode, moderate ([place]) 06/06/2010  . 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 Anxiety state 01/07/2007  . BARRETT'S ESOPHAGUS, HX OF 07/03/2005    Past Medical History  Diagnosis Date  . Peptic ulcer disease     dr Oletta Lamas  . Acute duodenal ulcer with hemorrhage and perforation, with obstruction ([place])   . Barrett's esophagus   . Menopause   . GERD (gastroesophageal reflux disease)   . Asthma   . Chronic abdominal pain     narcotic dependence, dr gyarteng-dak at [facility]  . Narcotic abuse   . Anxiety     sees [name] NP at Dr. Radonna Ricker office  . Fx two ribs-open 08-24-11    left 4th and 5th  . Fx of fibula 07-28-11    left fibula, 2 places   . COPD (chronic obstructive pulmonary disease) ([place])   .  Allergy   . Anemia   . History of blood transfusion     "related to OR; maybe when I perforated that ulcer"  . Lap Nissen + truncal vagotomy July 2008 05/13/2013  . LEG EDEMA 01/19/2008    Qualifier: Diagnosis of  By: [name] MD, [name]   . ACUTE DUODEN ULCER W/HEMORR PERF&OBSTRUCTION 06/26/2004    Qualifier: Diagnosis of  By: [name] MD, [name]   . FEVER, RECURRENT 08/11/2009    Qualifier: Diagnosis of  By: [name] MD, [name] MENOPAUSE 01/07/2007    Qualifier: Diagnosis of  By: [name], CMA, [place]    . Gastroparesis 06/03/2007    Qualifier: Diagnosis of  By: [name] MD, [name]   . GASTRIC OUTLET OBSTRUCTION 08/28/2007    In July 2008 she underwent laparoscopic enterolysis,  Nissen fundoplication over a [id number] bougie, single pledgeted suture colosure of the hiatus and a 3 suture wrap.  Lap truncal vagotomy and a loop gastrojejunostomy was performed.  This was revised in August of 2009 to a roux en Y gastrojejujostomy.     . 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 Depression   . History of hiatal hernia   . Complication of anesthesia     pt states had too much xanax on board - agitated   . Anginal pain ([place])     "many many years ago"  . Exertional shortness of breath   . Pneumonia, organism unspecified 11/07/2010    Assoc with R Parapneumonic effusion 09/2010    - Tapped 10/05/10    - CxR resolved 11/07/2010    . Headache     "monthly" (09/06/2014)  . Arthritis     "hips" (09/06/2014)  . Myocardial infarction ([place])   . S/P jejunostomy Feb 2016 07/26/2014    Past Surgical History  Procedure Laterality Date  . Esophagogastroduodenoscopy  12-29-09    dr qadeer at [facility], several gastric ulcers  . Laparoscopic nissen fundoplication    . Gastrojejunostomy  02-20-08    Roux en Y gastrojejunsotomy w 1 foot Roux limb  . Repair of perforated ulcer    . Biopsy thyroid  08-13-11    benign nodule, per Dr. Melida Quitter   . Esophagogastroduodenoscopy N/A 09/25/2012    Procedure: ESOPHAGOGASTRODUODENOSCOPY (EGD);  Surgeon: Beryle Beams, MD;  Location: [facility];  Service: Endoscopy;  Laterality: N/A;  . Fracture surgery    . Tonsillectomy    . Cataract extraction w/ intraocular lens  implant, bilateral Bilateral   . Laparoscopic partial gastrectomy N/A 06/29/2013    Procedure: Gastrectomy and GJ Tube placement;  Surgeon: Pedro Earls, MD;  Location: [facility];  Service: General;  Laterality: N/A;  . Colonoscopy    . Gastrostomy N/A 07/26/2014    Procedure: LAPRASCOPIC ASSISTED OPEN PLACEMENT OF JEJUNOSTOMY TUBE;  Surgeon: Pedro Earls, MD;  Location: [facility];  Service: General;  Laterality: N/A;  . Orif hip fracture Bilateral     "pins in both of  them"  . Dilation and curettage of uterus    . Hernia repair      "hiatal"  . Joint replacement      Social History  Substance Use Topics  . Smoking status: Current Every Day Smoker -- 2.00 packs/day for 50 years    Types: Cigarettes  . Smokeless tobacco: Never Used  . Alcohol Use: No    Family History  Problem Relation Age of Onset  . Cancer Mother  kidney, magliant tumor on face  . Stroke Mother   . Celiac disease Father   . Cancer Father     kidney and pancreatic    Allergies  Allergen Reactions  . Lithium Nausea And Vomiting  . Celebrex [Celecoxib] Other (See Comments)    History of bleeding ulcers  . Morphine Itching  . Quetiapine Other (See Comments)     tardive dyskinesia  . Varenicline Tartrate Other (See Comments)    hallucinations    Medication list has been reviewed and updated.  Current Outpatient Prescriptions on File Prior to Visit  Medication Sig Dispense Refill  . ALPRAZolam (XANAX) 1 MG tablet Take 1 tablet (1 mg total) by mouth 3 (three) times daily as needed for anxiety. This is a 1 month supply 90 tablet 0  . Aspirin-Acetaminophen-Caffeine (GOODY HEADACHE PO) Take 1 packet by mouth 4 (four) times daily as needed (pain).    . Calcium Citrate-Vitamin D (CALCIUM CITRATE CHEWY BITE) 500-500 MG-UNIT CHEW Chew 1 tablet by mouth 2 (two) times daily. 180 tablet 3  . DULoxetine (CYMBALTA) 60 MG capsule Take 1 capsule (60 mg total) by mouth daily. 30 capsule 0  . ergocalciferol (VITAMIN D2) 50000 UNITS capsule Take 1 capsule (50,000 Units total) by mouth once a week. 24 capsule 0  . feeding supplement (BOOST / RESOURCE BREEZE) LIQD Take 1 Container by mouth 3 (three) times daily between meals. 90 Container 11  . furosemide (LASIX) 20 MG tablet Take 1 tablet (20 mg) by mouth with breakfast and/or lunch as needed for swelling 60 tablet 0  . HYDROcodone-acetaminophen (NORCO) 10-325 MG tablet Take 1-2 tablets by mouth every 6 (six) hours as needed for moderate  pain. This is a 1 month supply. You will be due for a refill on 12/15 240 tablet 0  . ipratropium-albuterol (DUONEB) 0.5-2.5 (3) MG/3ML SOLN Take 3 mLs by nebulization 4 (four) times daily -  before meals and at bedtime. 360 mL 3  . OLANZapine (ZYPREXA) 7.5 MG tablet Take 1 tablet (7.5 mg total) by mouth at bedtime. 30 tablet 1  . ondansetron (ZOFRAN ODT) 4 MG disintegrating tablet Take 1-2 tablets (4-8 mg total) by mouth every 8 (eight) hours as needed for nausea or vomiting. 180 tablet 0  . pantoprazole (PROTONIX) 40 MG tablet Take 1 tablet (40 mg total) by mouth 2 (two) times daily before a meal. 180 tablet 3  . polyethylene glycol powder (GLYCOLAX/MIRALAX) powder Take 17 g by mouth 2 (two) times daily as needed. 850 g 11  . potassium chloride SA (K-DUR,KLOR-CON) 20 MEQ tablet Take 1 tablet (20 mEq total) by mouth 2 (two) times daily. 60 tablet 0   No current facility-administered medications on file prior to visit.    ROS ROS otherwise unremarkable unless listed above.   Physical Examination: BP 104/64 mmHg  Pulse 103  Temp(Src) 98.4 F (36.9 C) (Oral)  Resp 17  Ht 5\' 5"  (1.651 m)  Wt 93 lb (42.185 kg)  BMI 15.48 kg/m2  SpO2 97% Ideal Body Weight: Weight in (lb) to have BMI = 25: 149.9 Wt Readings from Last 3 Encounters:  05/31/15 93 lb (42.185 kg)  05/09/15 95 lb (43.092 kg)  04/25/15 96 lb 9.6 oz (43.817 kg)    Physical Exam  Constitutional: She is oriented to person, place, and time. She appears well-developed and well-nourished. No distress.  HENT:  Head: Normocephalic and atraumatic.  Right Ear: External ear normal.  Left Ear: External ear normal.  Eyes: Conjunctivae and  EOM are normal. Pupils are equal, round, and reactive to light.  Cardiovascular: Normal rate and regular rhythm.  Exam reveals no gallop and no friction rub.   No murmur heard. Pulses:      Carotid pulses are 2+ on the right side, and 2+ on the left side.      Radial pulses are 2+ on the right side,  and 2+ on the left side.  Pulmonary/Chest: Effort normal. No respiratory distress. She has no decreased breath sounds. She has no wheezes. She has no rhonchi.  Abdominal: Soft. Normal appearance and bowel sounds are normal. There is no tenderness.  Generalized tenderness with palpation to the abdomen, though she reports this is not abnormal  Neurological: She is alert and oriented to person, place, and time.  Skin: She is not diaphoretic.  Psychiatric: She has a normal mood and affect. Her behavior is normal.   Results for orders placed or performed in visit on 05/09/15  Comprehensive metabolic panel  Result Value Ref Range   Sodium 140 135 - 146 mmol/L   Potassium 4.7 3.5 - 5.3 mmol/L   Chloride 102 98 - 110 mmol/L   CO2 29 20 - 31 mmol/L   Glucose, Bld 83 65 - 99 mg/dL   BUN 18 7 - 25 mg/dL   Creat 0.71 0.50 - 0.99 mg/dL   Total Bilirubin 0.3 0.2 - 1.2 mg/dL   Alkaline Phosphatase 99 33 - 130 U/L   AST 16 10 - 35 U/L   ALT 9 6 - 29 U/L   Total Protein 6.2 6.1 - 8.1 g/dL   Albumin 3.5 (L) 3.6 - 5.1 g/dL   Calcium 8.8 8.6 - 10.4 mg/dL      Assessment and Plan: [name] is a 64 y.o. female who is here today for cc of lethargy and weight loss.  IV given with report afterward that she feels much better. At this time, I am not refilling medications.  I will send request to her pcp for consideration.  Patient understands.   -advised use of lasix for short stint, if swelling of lower extremity starts.    -lab was not able to draw at this time.  Advised water hydration, and return within next week for blood.  Future order placed.   Lethargic - Plan: feeding supplement (BOOST / RESOURCE BREEZE) LIQD, CBC, Basic metabolic panel, CANCELED: CBC, CANCELED: Basic metabolic panel  Underweight - Plan: feeding supplement (BOOST / RESOURCE BREEZE) LIQD, CBC, Basic metabolic panel   Ivar Drape, PA-C [facility] 05/31/2015 8:58  AM

## 2015-05-31 NOTE — Patient Instructions (Addendum)
Dr. Brigitte Pulse will be in the appointment center (104) tomorrow.  If you would like to make a call in the morning, around 8:45am, to see if she has availability, that would be fine.  She will be at the urgent care (102) all day 8am-8pm Friday. In the meantime, I will send her the note, and your request for medication refill.  So you do not have to see her at this time.  Just if you want to.    If you start to have the swelling in your legs, take the lasix diuretic medication.  Otherwise do not take it.   I would like you to hydrate, and within the next week you can return for blood work so we can check that.  Please try to hydrate with water.

## 2015-06-01 DIAGNOSIS — I872 Venous insufficiency (chronic) (peripheral): Secondary | ICD-10-CM | POA: Diagnosis not present

## 2015-06-01 DIAGNOSIS — Z9181 History of falling: Secondary | ICD-10-CM | POA: Diagnosis not present

## 2015-06-01 DIAGNOSIS — M810 Age-related osteoporosis without current pathological fracture: Secondary | ICD-10-CM | POA: Diagnosis not present

## 2015-06-01 DIAGNOSIS — J449 Chronic obstructive pulmonary disease, unspecified: Secondary | ICD-10-CM | POA: Diagnosis not present

## 2015-06-01 DIAGNOSIS — Z4800 Encounter for change or removal of nonsurgical wound dressing: Secondary | ICD-10-CM | POA: Diagnosis not present

## 2015-06-01 DIAGNOSIS — F331 Major depressive disorder, recurrent, moderate: Secondary | ICD-10-CM | POA: Diagnosis not present

## 2015-06-01 DIAGNOSIS — L97811 Non-pressure chronic ulcer of other part of right lower leg limited to breakdown of skin: Secondary | ICD-10-CM | POA: Diagnosis not present

## 2015-06-01 DIAGNOSIS — G2401 Drug induced subacute dyskinesia: Secondary | ICD-10-CM | POA: Diagnosis not present

## 2015-06-01 DIAGNOSIS — T4395XS Adverse effect of unspecified psychotropic drug, sequela: Secondary | ICD-10-CM | POA: Diagnosis not present

## 2015-06-01 DIAGNOSIS — K279 Peptic ulcer, site unspecified, unspecified as acute or chronic, without hemorrhage or perforation: Secondary | ICD-10-CM | POA: Diagnosis not present

## 2015-06-01 DIAGNOSIS — F1123 Opioid dependence with withdrawal: Secondary | ICD-10-CM | POA: Diagnosis not present

## 2015-06-01 DIAGNOSIS — M160 Bilateral primary osteoarthritis of hip: Secondary | ICD-10-CM | POA: Diagnosis not present

## 2015-06-01 DIAGNOSIS — J45909 Unspecified asthma, uncomplicated: Secondary | ICD-10-CM | POA: Diagnosis not present

## 2015-06-01 DIAGNOSIS — G894 Chronic pain syndrome: Secondary | ICD-10-CM | POA: Diagnosis not present

## 2015-06-01 DIAGNOSIS — I10 Essential (primary) hypertension: Secondary | ICD-10-CM | POA: Diagnosis not present

## 2015-06-02 ENCOUNTER — Telehealth: Payer: Self-pay | Admitting: Physician Assistant

## 2015-06-02 ENCOUNTER — Ambulatory Visit: Payer: Self-pay | Admitting: *Deleted

## 2015-06-02 MED ORDER — BOOST / RESOURCE BREEZE PO LIQD: 1.0000 | Freq: Three times a day (TID) | ORAL | Status: DC

## 2015-06-03 ENCOUNTER — Ambulatory Visit (INDEPENDENT_AMBULATORY_CARE_PROVIDER_SITE_OTHER): Payer: Medicare Other | Admitting: Family Medicine

## 2015-06-03 VITALS — BP 102/70 | HR 84 | Temp 97.8°F | Resp 14 | Ht 65.0 in | Wt 91.4 lb

## 2015-06-03 DIAGNOSIS — R5383 Other fatigue: Secondary | ICD-10-CM | POA: Diagnosis not present

## 2015-06-03 DIAGNOSIS — K219 Gastro-esophageal reflux disease without esophagitis: Secondary | ICD-10-CM

## 2015-06-03 DIAGNOSIS — G894 Chronic pain syndrome: Secondary | ICD-10-CM | POA: Diagnosis not present

## 2015-06-03 DIAGNOSIS — F4323 Adjustment disorder with mixed anxiety and depressed mood: Secondary | ICD-10-CM | POA: Diagnosis not present

## 2015-06-03 DIAGNOSIS — R636 Underweight: Secondary | ICD-10-CM | POA: Diagnosis not present

## 2015-06-03 DIAGNOSIS — J431 Panlobular emphysema: Secondary | ICD-10-CM

## 2015-06-03 MED ORDER — OLANZAPINE 7.5 MG PO TABS: 7.5000 mg | ORAL_TABLET | Freq: Every day | ORAL | Status: DC

## 2015-06-03 MED ORDER — BOOST / RESOURCE BREEZE PO LIQD: 1.0000 | Freq: Three times a day (TID) | ORAL | Status: DC

## 2015-06-03 MED ORDER — IPRATROPIUM-ALBUTEROL 0.5-2.5 (3) MG/3ML IN SOLN: 3.0000 mL | Freq: Three times a day (TID) | RESPIRATORY_TRACT | Status: DC

## 2015-06-03 MED ORDER — ALPRAZOLAM 1 MG PO TABS: 1.0000 mg | ORAL_TABLET | Freq: Three times a day (TID) | ORAL | Status: DC | PRN

## 2015-06-03 MED ORDER — DULOXETINE HCL 60 MG PO CPEP: 60.0000 mg | ORAL_CAPSULE | Freq: Every day | ORAL | Status: DC

## 2015-06-03 MED ORDER — HYDROCODONE-ACETAMINOPHEN 10-325 MG PO TABS: 1.0000 | ORAL_TABLET | Freq: Four times a day (QID) | ORAL | Status: DC | PRN

## 2015-06-03 MED ORDER — CYANOCOBALAMIN 1000 MCG/ML IJ SOLN
1000.0000 ug | INTRAMUSCULAR | Status: DC
  Administered 2015-06-04: 1000 ug via INTRAMUSCULAR

## 2015-06-03 MED ORDER — PANTOPRAZOLE SODIUM 40 MG PO TBEC: 40.0000 mg | DELAYED_RELEASE_TABLET | Freq: Two times a day (BID) | ORAL | Status: DC

## 2015-06-03 NOTE — Patient Instructions (Signed)
You have chronic obstructive pulmonary disease. Continued smoking will result in continued weight loss. All the way to turn around your worsening weight is to reduce and eventually stop smoking. Try starting out with just one pack per day and make it last all day. After one month, try reducing to 15 cigarettes per day. Continue to reduce by 5 cigarettes per day each month until you're down to 1 or 2 per day.

## 2015-06-03 NOTE — Progress Notes (Signed)
Patient ID: [name], female   DOB: 07/22/1950, 64 y.o.   MRN: [id number]   This chart was scribed for Robyn Haber, MD by [facility], medical scribe at [facility].The patient was seen in exam room 13 and the patient's care was started at 12:18 PM.  Patient ID: [name] MRN: [id number], DOB: 1951-06-15, 64 y.o. Date of Encounter: 06/03/2015  Primary Physician: [name], MD  Chief Complaint:  Chief Complaint  Patient presents with   Weight Loss   Fatigue   would like B12 injections   HPI:  [name] is a 64 y.o. female who [facility] to [facility] with concerns of weight loss and fatigue. She has been trying to gain weight and has not been able to. Hx of stomach ulcers. Had a laparoscopic fundoplication. She requests B12 injections. Also takes breeze supplement.  "Does not feel depressed but feels bad". She does smoke 2 p.p.d.   Past Medical History  Diagnosis Date   Peptic ulcer disease     dr edwards   Acute duodenal ulcer with hemorrhage and perforation, with obstruction ([place])    Barrett's esophagus    Menopause    GERD (gastroesophageal reflux disease)    Asthma    Chronic abdominal pain     narcotic dependence, dr gyarteng-dak at [facility]   Narcotic abuse    Anxiety     sees [name] NP at Dr. Radonna Ricker office   Fx two ribs-open 08-24-11    left 4th and 5th   Fx of fibula 07-28-11    left fibula, 2 places    COPD (chronic obstructive pulmonary disease) ([place])    Allergy    Anemia    History of blood transfusion     "related to OR; maybe when I perforated that ulcer"   Lap Nissen + truncal vagotomy July 2008 05/13/2013   LEG EDEMA 01/19/2008    Qualifier: Diagnosis of  By: [name] MD, Langdon Wille Celeste PERF&OBSTRUCTION 06/26/2004    Qualifier: Diagnosis of  By: [name] MD, [name]    FEVER, RECURRENT 08/11/2009    Qualifier: Diagnosis of  By: [name] MD, [name]     MENOPAUSE 01/07/2007    Qualifier: Diagnosis of  By: [name], CMA, [place]     Gastroparesis 06/03/2007    Qualifier: Diagnosis of  By: [name] MD, [name]    GASTRIC OUTLET OBSTRUCTION 08/28/2007    In July 2008 she underwent laparoscopic enterolysis, Nissen fundoplication over a [id number] bougie, single pledgeted suture colosure of the hiatus and a 3 suture wrap.  Lap truncal vagotomy and a loop gastrojejunostomy was performed.  This was revised in August of 2009 to a roux en Y gastrojejujostomy.      Chronic duodenal ulcer with gastric outlet obstruction 06/29/2013   Depression    History of hiatal hernia    Complication of anesthesia     pt states had too much xanax on board - agitated    Anginal pain ([place])     "many many years ago"   Exertional shortness of breath    Pneumonia, organism unspecified 11/07/2010    Assoc with R Parapneumonic effusion 09/2010    - Tapped 10/05/10    - CxR resolved 11/07/2010     Headache     "monthly" (09/06/2014)   Arthritis     "hips" (09/06/2014)   Myocardial infarction ([place])    S/P  jejunostomy Feb 2016 07/26/2014    Home Meds: Prior to Admission medications   Medication Sig Start Date End Date Taking? Authorizing Provider  ALPRAZolam [name]) 1 MG tablet Take 1 tablet (1 mg total) by mouth 3 (three) times daily as needed for anxiety. This is a 1 month supply 05/09/15  Yes Shawnee Knapp, MD  Aspirin-Acetaminophen-Caffeine (GOODY HEADACHE PO) Take 1 packet by mouth 4 (four) times daily as needed (pain).   Yes Historical Provider, MD  Calcium Citrate-Vitamin D (CALCIUM CITRATE CHEWY BITE) 500-500 MG-UNIT CHEW Chew 1 tablet by mouth 2 (two) times daily. 04/17/15  Yes Shawnee Knapp, MD  DULoxetine (CYMBALTA) 60 MG capsule Take 1 capsule (60 mg total) by mouth daily. 05/09/15  Yes Shawnee Knapp, MD  ergocalciferol (VITAMIN D2) 50000 UNITS capsule Take 1 capsule (50,000 Units total) by mouth once a week. 03/05/15  Yes Shawnee Knapp, MD  feeding supplement (BOOST / RESOURCE  BREEZE) LIQD Take 1 Container by mouth 3 (three) times daily between meals. 05/31/15  Yes Dorian Heckle English, PA  feeding supplement (BOOST / RESOURCE BREEZE) LIQD Take 1 Container by mouth 3 (three) times daily between meals. 06/02/15  Yes Dorian Heckle English, PA  furosemide (LASIX) 20 MG tablet Take 1 tablet (20 mg) by mouth with breakfast and/or lunch as needed for swelling 04/12/15  Yes Shawnee Knapp, MD  HYDROcodone-acetaminophen [facility]) 10-325 MG tablet Take 1-2 tablets by mouth every 6 (six) hours as needed for moderate pain. This is a 1 month supply. You will be due for a refill on 12/15 05/09/15  Yes Shawnee Knapp, MD  ipratropium-albuterol (DUONEB) 0.5-2.5 (3) MG/3ML SOLN Take 3 mLs by nebulization 4 (four) times daily -  before meals and at bedtime. 04/17/15  Yes Shawnee Knapp, MD  OLANZapine (ZYPREXA) 7.5 MG tablet Take 1 tablet (7.5 mg total) by mouth at bedtime. 05/09/15  Yes Shawnee Knapp, MD  ondansetron (ZOFRAN ODT) 4 MG disintegrating tablet Take 1-2 tablets (4-8 mg total) by mouth every 8 (eight) hours as needed for nausea or vomiting. 03/30/15  Yes Shawnee Knapp, MD  pantoprazole (PROTONIX) 40 MG tablet Take 1 tablet (40 mg total) by mouth 2 (two) times daily before a meal. 03/05/15  Yes Shawnee Knapp, MD  polyethylene glycol powder (GLYCOLAX/MIRALAX) powder Take 17 g by mouth 2 (two) times daily as needed. 04/12/15  Yes Shawnee Knapp, MD  potassium chloride SA (K-DUR,KLOR-CON) 20 MEQ tablet Take 1 tablet (20 mEq total) by mouth 2 (two) times daily. 04/12/15  Yes Shawnee Knapp, MD   Allergies:  Allergies  Allergen Reactions   Lithium Nausea And Vomiting   Celebrex [Celecoxib] Other (See Comments)    History of bleeding ulcers   Morphine Itching   Quetiapine Other (See Comments)     tardive dyskinesia   Varenicline Tartrate Other (See Comments)    hallucinations   Social History   Social History   Marital Status: Divorced    Spouse Name: N/A   Number of Children: N/A   Years of  Education: N/A   Occupational History   Not on file.   Social History Main Topics   Smoking status: Current Every Day Smoker -- 2.00 packs/day for 50 years    Types: Cigarettes   Smokeless tobacco: Never Used   Alcohol Use: No   Drug Use: No   Sexual Activity: No   Other Topics Concern   Not on file   Social History  Narrative    Review of Systems: Constitutional: negative for chills, fever, night sweats. Positive for weight changes and fatigue HEENT: negative for vision changes, hearing loss, congestion, rhinorrhea, ST, epistaxis, or sinus pressure Cardiovascular: negative for chest pain or palpitations Respiratory: negative for hemoptysis, wheezing, shortness of breath, or cough Abdominal: negative for nausea, vomiting, diarrhea, or constipation. Positive for abdominal pain. Dermatological: negative for rash Neurologic: negative for headache, dizziness, or syncope All other systems reviewed and are otherwise negative with the exception to those above and in the HPI.  Physical Exam: Emaciated woman Blood pressure 102/70, pulse 84, temperature 97.8 F (36.6 C), temperature source Oral, resp. rate 14, height 5\' 5"  (1.651 m), weight 91 lb 6.4 oz (41.459 kg), SpO2 96 %., Body mass index is 15.21 kg/(m^2). General: Well developed, well nourished, in no acute distress. Head: Normocephalic, atraumatic, eyes without discharge, sclera non-icteric, nares are without discharge. Bilateral auditory canals clear, TM's are without perforation, pearly grey and translucent with reflective cone of light bilaterally. Oral cavity moist, posterior pharynx without exudate, erythema, peritonsillar abscess, or post nasal drip.  Neck: Supple. No thyromegaly. Full ROM. No lymphadenopathy. Lungs: Clear bilaterally to auscultation without wheezes, rales, or rhonchi. Breathing is unlabored. Tachypnea.  Heart: RRR with S1 S2. No murmurs, rubs, or gallops appreciated. Abdomen: Soft, non-tender,  non-distended with normoactive bowel sounds. No hepatomegaly. No rebound/guarding. No obvious abdominal masses. Msk:  Strength and tone normal for age. Extremities/Skin: Warm and dry. No clubbing or cyanosis. No edema. No rashes or suspicious lesions. Neuro: Alert and oriented X 3. Moves all extremities spontaneously. Gait is normal. CNII-XII grossly in tact. Psych:  Responds to questions appropriately with a normal affect.    ASSESSMENT AND PLAN:  64 y.o. year old female with COPD related wasting. I counseled the patient on the absolute necessity of tapering and then quitting cigarettes. She understands the consequences of continuing. I reordered her medications and asked her to come back in 2-4 weeks.  By signing my name below, I, Nadim Abuhashem, attest that this documentation has been prepared under the direction and in the presence of Robyn Haber, MD.  Electronically Signed: Lora Havens, medical scribe. 06/03/2015 12:18 PM.

## 2015-06-04 ENCOUNTER — Telehealth: Payer: Self-pay | Admitting: Family Medicine

## 2015-06-04 ENCOUNTER — Ambulatory Visit (INDEPENDENT_AMBULATORY_CARE_PROVIDER_SITE_OTHER): Payer: Medicare Other | Admitting: Family Medicine

## 2015-06-04 VITALS — BP 110/72 | Temp 98.9°F | Resp 18 | Ht 65.0 in | Wt 93.8 lb

## 2015-06-04 DIAGNOSIS — R636 Underweight: Secondary | ICD-10-CM

## 2015-06-04 DIAGNOSIS — R5383 Other fatigue: Secondary | ICD-10-CM | POA: Diagnosis not present

## 2015-06-04 DIAGNOSIS — Z903 Acquired absence of stomach [part of]: Secondary | ICD-10-CM

## 2015-06-04 DIAGNOSIS — E43 Unspecified severe protein-calorie malnutrition: Secondary | ICD-10-CM

## 2015-06-04 DIAGNOSIS — F1120 Opioid dependence, uncomplicated: Secondary | ICD-10-CM | POA: Diagnosis not present

## 2015-06-04 DIAGNOSIS — F411 Generalized anxiety disorder: Secondary | ICD-10-CM | POA: Diagnosis not present

## 2015-06-04 DIAGNOSIS — R5381 Other malaise: Secondary | ICD-10-CM | POA: Diagnosis not present

## 2015-06-04 DIAGNOSIS — F331 Major depressive disorder, recurrent, moderate: Secondary | ICD-10-CM

## 2015-06-04 DIAGNOSIS — N2581 Secondary hyperparathyroidism of renal origin: Secondary | ICD-10-CM | POA: Diagnosis not present

## 2015-06-04 DIAGNOSIS — D530 Protein deficiency anemia: Secondary | ICD-10-CM | POA: Diagnosis not present

## 2015-06-04 DIAGNOSIS — J449 Chronic obstructive pulmonary disease, unspecified: Secondary | ICD-10-CM

## 2015-06-04 DIAGNOSIS — G894 Chronic pain syndrome: Secondary | ICD-10-CM

## 2015-06-04 LAB — COMPREHENSIVE METABOLIC PANEL
ALT: 11 U/L (ref 6–29)
AST: 16 U/L (ref 10–35)
Albumin: 3.5 g/dL — ABNORMAL LOW (ref 3.6–5.1)
Alkaline Phosphatase: 79 U/L (ref 33–130)
BUN: 10 mg/dL (ref 7–25)
CHLORIDE: 105 mmol/L (ref 98–110)
CO2: 30 mmol/L (ref 20–31)
Calcium: 8.8 mg/dL (ref 8.6–10.4)
Creat: 0.59 mg/dL (ref 0.50–0.99)
Glucose, Bld: 90 mg/dL (ref 65–99)
POTASSIUM: 4.6 mmol/L (ref 3.5–5.3)
Sodium: 140 mmol/L (ref 135–146)
TOTAL PROTEIN: 6.1 g/dL (ref 6.1–8.1)
Total Bilirubin: 0.3 mg/dL (ref 0.2–1.2)

## 2015-06-04 LAB — POCT CBC
GRANULOCYTE PERCENT: 69 % (ref 37–80)
HCT, POC: 37.2 % — AB (ref 37.7–47.9)
HEMOGLOBIN: 12.6 g/dL (ref 12.2–16.2)
Lymph, poc: 1.6 (ref 0.6–3.4)
MCH: 29.7 pg (ref 27–31.2)
MCHC: 33.7 g/dL (ref 31.8–35.4)
MCV: 87.9 fL (ref 80–97)
MID (CBC): 0.6 (ref 0–0.9)
MPV: 8.5 fL (ref 0–99.8)
PLATELET COUNT, POC: 257 10*3/uL (ref 142–424)
POC Granulocyte: 5 (ref 2–6.9)
POC LYMPH PERCENT: 22.9 %L (ref 10–50)
POC MID %: 8.1 %M (ref 0–12)
RBC: 4.24 M/uL (ref 4.04–5.48)
RDW, POC: 16.1 %
WBC: 7.2 10*3/uL (ref 4.6–10.2)

## 2015-06-04 LAB — SEDIMENTATION RATE: SED RATE: 1 mm/h (ref 0–30)

## 2015-06-04 LAB — C-REACTIVE PROTEIN

## 2015-06-04 LAB — FERRITIN: FERRITIN: 19 ng/mL (ref 10–291)

## 2015-06-04 LAB — TSH: TSH: 3.682 u[IU]/mL (ref 0.350–4.500)

## 2015-06-04 MED ORDER — HYDROMORPHONE HCL 2 MG PO TABS: 2.0000 mg | ORAL_TABLET | Freq: Four times a day (QID) | ORAL | Status: DC | PRN

## 2015-06-04 MED ORDER — ONDANSETRON 4 MG PO TBDP: 4.0000 mg | ORAL_TABLET | Freq: Three times a day (TID) | ORAL | Status: DC | PRN

## 2015-06-04 MED ORDER — OLANZAPINE 10 MG PO TABS: 10.0000 mg | ORAL_TABLET | Freq: Every day | ORAL | Status: DC

## 2015-06-04 NOTE — Telephone Encounter (Signed)
I spoke to patient advised her this has been sent in for her

## 2015-06-04 NOTE — Patient Instructions (Addendum)
Lets increase your zyprexa from 7.5 to 10mg  - I [facility] a new [facility] but you can take 1 1/2 of your current tabs until you run out.    I know you are feeling worse but you are looking and sounding better - I think that this might have to do with withdrawal from the hydrocodone if you are having to take a lot less than prior since you ran out early.  You can try a few dilaudid over the next few days until you can get your hydrocodone refilled but DO NOT [place] - this will end you up in an accidental overdose quickly!  Increase iron in your diet.  Find a sublingual iron or a slow release type that is easier on your stomach. This will help your energy levels.

## 2015-06-04 NOTE — Telephone Encounter (Signed)
Patient called requesting medication for nausea. I reviewed chart and Dr. Brigitte Pulse did send in Zofran ODT to pharmacy. I left message to return call.

## 2015-06-04 NOTE — Progress Notes (Addendum)
Subjective:  This chart was scribed for [name], MD by [facility], medical scribe at [facility].The patient was seen in exam room 12 and the patient's care was started at 8:18 AM.   Patient ID: [name], female    DOB: [birth year], 64 y.o.   MRN: [id number] Chief Complaint  Patient presents with  . Follow-up   HPI HPI Comments: [name] is a 64 y.o. female who presents to [facility] for a follow up. Seen on 12/07 and 12/10 for fatigue and weakness. She was given IV fluids at both visits. Her medications were refilled on 12/10. Not taking her lasix. Has not needed her zofran lately. Taking her vitamin D weekly. Compliant with medications. Drinking Breeze boost, one a day. She is running out of this. She does request a B12 injection today. Overall she feels tired, and weak. Does have a BM daily. Home care does come for check ups, she is on her last visit. Missed her appointment with her nutritionist on Friday. Denies lightheadedness, dizziness, chills, fever, and congestion.  Past Medical History  Diagnosis Date  . Peptic ulcer disease     dr Oletta Lamas  . Acute duodenal ulcer with hemorrhage and perforation, with obstruction ([employer])   . Barrett's esophagus   . Menopause   . GERD (gastroesophageal reflux disease)   . Asthma   . Chronic abdominal pain     narcotic dependence, dr gyarteng-dak at [facility]  . Narcotic abuse   . Anxiety     sees [name] NP at Dr. Radonna Ricker office  . Fx two ribs-open 08-24-11    left 4th and 5th  . Fx of fibula 07-28-11    left fibula, 2 places   . COPD (chronic obstructive pulmonary disease) ([place])   . Allergy   . Anemia   . History of blood transfusion     "related to OR; maybe when I perforated that ulcer"  . Lap Nissen + truncal vagotomy July 2008 05/13/2013  . LEG EDEMA 01/19/2008    Qualifier: Diagnosis of  By: [name] MD, [name]   . ACUTE DUODEN ULCER W/HEMORR PERF&OBSTRUCTION 06/26/2004      Qualifier: Diagnosis of  By: [name] MD, [name]   . FEVER, RECURRENT 08/11/2009    Qualifier: Diagnosis of  By: [name] MD, [name] MENOPAUSE 01/07/2007    Qualifier: Diagnosis of  By: [name], CMA, [place]    . Gastroparesis 06/03/2007    Qualifier: Diagnosis of  By: [name] MD, [name]   . GASTRIC OUTLET OBSTRUCTION 08/28/2007    In July 2008 she underwent laparoscopic enterolysis, Nissen fundoplication over a [id number] bougie, single pledgeted suture colosure of the hiatus and a 3 suture wrap.  Lap truncal vagotomy and a loop gastrojejunostomy was performed.  This was revised in August of 2009 to a roux en Y gastrojejujostomy.     . 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 Depression   . History of hiatal hernia   . Complication of anesthesia     pt states had too much xanax on board - agitated   . Anginal pain ([place])     "many many years ago"  . Exertional shortness of breath   . Pneumonia, organism unspecified 11/07/2010    Assoc with R Parapneumonic effusion 09/2010    - Tapped 10/05/10    - CxR resolved 11/07/2010    . Headache     "  monthly" (09/06/2014)  . Arthritis     "hips" (09/06/2014)  . Myocardial infarction ([place])   . S/P jejunostomy Feb 2016 07/26/2014   Current Outpatient Prescriptions on File Prior to Visit  Medication Sig Dispense Refill  . ALPRAZolam (XANAX) 1 MG tablet Take 1 tablet (1 mg total) by mouth 3 (three) times daily as needed for anxiety. This is a 1 month supply 90 tablet 5  . Aspirin-Acetaminophen-Caffeine (GOODY HEADACHE PO) Take 1 packet by mouth 4 (four) times daily as needed (pain).    . Calcium Citrate-Vitamin D (CALCIUM CITRATE CHEWY BITE) 500-500 MG-UNIT CHEW Chew 1 tablet by mouth 2 (two) times daily. 180 tablet 3  . DULoxetine (CYMBALTA) 60 MG capsule Take 1 capsule (60 mg total) by mouth daily. 30 capsule 5  . ergocalciferol (VITAMIN D2) 50000 UNITS capsule Take 1 capsule (50,000 Units total) by mouth once a week. 24 capsule 0  .  feeding supplement (BOOST / RESOURCE BREEZE) LIQD Take 1 Container by mouth 3 (three) times daily between meals. 90 Container 11  . feeding supplement (BOOST / RESOURCE BREEZE) LIQD Take 1 Container by mouth 3 (three) times daily between meals. 90 Container 11  . furosemide (LASIX) 20 MG tablet Take 1 tablet (20 mg) by mouth with breakfast and/or lunch as needed for swelling 60 tablet 0  . HYDROcodone-acetaminophen (NORCO) 10-325 MG tablet Take 1-2 tablets by mouth every 6 (six) hours as needed for moderate pain. This is a 1 month supply. You will be due for a refill on 12/15 240 tablet 0  . ipratropium-albuterol (DUONEB) 0.5-2.5 (3) MG/3ML SOLN Take 3 mLs by nebulization 4 (four) times daily -  before meals and at bedtime. 360 mL 3  . OLANZapine (ZYPREXA) 7.5 MG tablet Take 1 tablet (7.5 mg total) by mouth at bedtime. 30 tablet 1  . ondansetron (ZOFRAN ODT) 4 MG disintegrating tablet Take 1-2 tablets (4-8 mg total) by mouth every 8 (eight) hours as needed for nausea or vomiting. 180 tablet 0  . pantoprazole (PROTONIX) 40 MG tablet Take 1 tablet (40 mg total) by mouth 2 (two) times daily before a meal. 180 tablet 3  . polyethylene glycol powder (GLYCOLAX/MIRALAX) powder Take 17 g by mouth 2 (two) times daily as needed. 850 g 11  . potassium chloride SA (K-DUR,KLOR-CON) 20 MEQ tablet Take 1 tablet (20 mEq total) by mouth 2 (two) times daily. 60 tablet 0   Current Facility-Administered Medications on File Prior to Visit  Medication Dose Route Frequency Provider Last Rate Last Dose  . cyanocobalamin ((VITAMIN B-12)) injection 1,000 mcg  1,000 mcg Intramuscular Q30 days Robyn Haber, MD       Allergies  Allergen Reactions  . Lithium Nausea And Vomiting  . Celebrex [Celecoxib] Other (See Comments)    History of bleeding ulcers  . Morphine Itching  . Quetiapine Other (See Comments)     tardive dyskinesia  . Varenicline Tartrate Other (See Comments)    hallucinations   Review of Systems    Constitutional: Positive for fatigue. Negative for fever and chills.  HENT: Negative for congestion and rhinorrhea.   Neurological: Positive for weakness. Negative for dizziness and light-headedness.      Objective:  BP 110/72 mmHg  Temp(Src) 98.9 F (37.2 C) (Oral)  Resp 18  Ht 5\' 5"  (1.651 m)  Wt 93 lb 12.8 oz (42.547 kg)  BMI 15.61 kg/m2 Physical Exam  Constitutional: She is oriented to person, place, and time. She appears well-developed and well-nourished. No distress.  HENT:  Head: Normocephalic and atraumatic.  Eyes: Pupils are equal, round, and reactive to light.  Neck: Normal range of motion.  Cardiovascular: Normal rate and regular rhythm.   Pulmonary/Chest: Effort normal. No respiratory distress.  Musculoskeletal: Normal range of motion.  Neurological: She is alert and oriented to person, place, and time.  Skin: Skin is warm and dry.  Psychiatric: She has a normal mood and affect. Her behavior is normal.  Nursing note and vitals reviewed.     Assessment & Plan:  Need iron supplements - rec she ask pharmacist about which prep may be the easiest on her chronic constipation. Use with miralax. Increase iron and protein in diet.  Increase breeze prot supp drinks back up to bid. Suspect pt's sxs today are due to narcotic w/d - she is supposed to be taking hydrocodone 8 tabs a day and has been explained repeatedly (and again today) that to increase further carries a high risk for permanent liver damage. Pt ran out of her 1 mo supply of hydrocodone that was given on 11/15 5d prior on 11/7 so used 4 wks supply in 3 wks - using 11-12 tabs/d.  Pt has a rx for a refill written by [place] at her visit yest but her ins won't fill until 12/15.  Has failed oxycodone and fetanyl patch prior to side effects but thinks she did fine on dilaudid so gave 5d supply to last her until hydrocodone fill Reviewed poss for accidental overdose as on xanax as well. Pt pre-contemplative about smoking  cessation - understands that it is contributing to many of her sxs but she doesn't think she'll be able to stop so is unwilling to try. One the few things she enjoys. Cont cymbalta 60 - pt has mised an appt to establish with psych twice and now has missed nutrition c/s twice as well.  Will try increasing zyprexa to help with mood and weight gain  1. Underweight   2. Chronic pain syndrome   3. Protein-calorie malnutrition, severe ([place])   4. Chronic obstructive pulmonary disease, unspecified COPD type (HCC)   5. Hydrocodone use disorder, severe, dependence ([place])   6. Physical deconditioning   7. Anemia due to protein deficiency   8. Major depressive disorder, recurrent episode, moderate (HCC)   9. Secondary hyperparathyroidism ([place])   10. History of subtotal gastrectomy with Roux-en-Y recontruction   11. Lethargic   12. Anxiety state     Orders Placed This Encounter  Procedures  . Comprehensive metabolic panel  . Sedimentation Rate  . C-reactive protein  . TSH  . Ferritin  . POCT CBC    Meds ordered this encounter  Medications  . ondansetron (ZOFRAN ODT) 4 MG disintegrating tablet    Sig: Take 1-2 tablets (4-8 mg total) by mouth every 8 (eight) hours as needed for nausea or vomiting.    Dispense:  180 tablet    Refill:  0  . OLANZapine (ZYPREXA) 10 MG tablet    Sig: Take 1 tablet (10 mg total) by mouth at bedtime.    Dispense:  30 tablet    Refill:  1    Failed risperdal, cannot tolerate seroquel due to tardive dyskinesia  . HYDROmorphone (DILAUDID) 2 MG tablet    Sig: Take 1 tablet (2 mg total) by mouth every 6 (six) hours as needed for severe pain.    Dispense:  20 tablet    Refill:  0   Over 40 min spent in face-to-face evaluation of and consultation  with patient and coordination of care.  Over 50% of this time was spent counseling this patient.   I personally performed the services described in this documentation, which was scribed in my presence. The recorded  information has been reviewed and considered, and addended by me as needed.  [name], MD MPH    By signing my name below, I, Nadim Abuhashem, attest that this documentation has been prepared under the direction and in the presence of [name], MD.  Electronically Signed: Lora Havens, medical scribe. 06/04/2015, 8:30 AM.

## 2015-06-06 ENCOUNTER — Encounter: Payer: Self-pay | Admitting: Family Medicine

## 2015-06-06 DIAGNOSIS — M810 Age-related osteoporosis without current pathological fracture: Secondary | ICD-10-CM | POA: Diagnosis not present

## 2015-06-06 DIAGNOSIS — F1123 Opioid dependence with withdrawal: Secondary | ICD-10-CM | POA: Diagnosis not present

## 2015-06-06 DIAGNOSIS — Z4800 Encounter for change or removal of nonsurgical wound dressing: Secondary | ICD-10-CM | POA: Diagnosis not present

## 2015-06-06 DIAGNOSIS — Z9181 History of falling: Secondary | ICD-10-CM | POA: Diagnosis not present

## 2015-06-06 DIAGNOSIS — G894 Chronic pain syndrome: Secondary | ICD-10-CM | POA: Diagnosis not present

## 2015-06-06 DIAGNOSIS — L97811 Non-pressure chronic ulcer of other part of right lower leg limited to breakdown of skin: Secondary | ICD-10-CM | POA: Diagnosis not present

## 2015-06-06 DIAGNOSIS — I10 Essential (primary) hypertension: Secondary | ICD-10-CM | POA: Diagnosis not present

## 2015-06-06 DIAGNOSIS — G2401 Drug induced subacute dyskinesia: Secondary | ICD-10-CM | POA: Diagnosis not present

## 2015-06-06 DIAGNOSIS — I872 Venous insufficiency (chronic) (peripheral): Secondary | ICD-10-CM | POA: Diagnosis not present

## 2015-06-06 DIAGNOSIS — J45909 Unspecified asthma, uncomplicated: Secondary | ICD-10-CM | POA: Diagnosis not present

## 2015-06-06 DIAGNOSIS — T4395XS Adverse effect of unspecified psychotropic drug, sequela: Secondary | ICD-10-CM | POA: Diagnosis not present

## 2015-06-06 DIAGNOSIS — K279 Peptic ulcer, site unspecified, unspecified as acute or chronic, without hemorrhage or perforation: Secondary | ICD-10-CM | POA: Diagnosis not present

## 2015-06-06 DIAGNOSIS — F331 Major depressive disorder, recurrent, moderate: Secondary | ICD-10-CM | POA: Diagnosis not present

## 2015-06-06 DIAGNOSIS — M160 Bilateral primary osteoarthritis of hip: Secondary | ICD-10-CM | POA: Diagnosis not present

## 2015-06-06 DIAGNOSIS — J449 Chronic obstructive pulmonary disease, unspecified: Secondary | ICD-10-CM | POA: Diagnosis not present

## 2015-06-07 ENCOUNTER — Encounter: Payer: Self-pay | Admitting: Vascular Surgery

## 2015-06-07 ENCOUNTER — Encounter (HOSPITAL_COMMUNITY): Payer: Self-pay

## 2015-06-07 DIAGNOSIS — M810 Age-related osteoporosis without current pathological fracture: Secondary | ICD-10-CM | POA: Diagnosis not present

## 2015-06-07 DIAGNOSIS — K279 Peptic ulcer, site unspecified, unspecified as acute or chronic, without hemorrhage or perforation: Secondary | ICD-10-CM | POA: Diagnosis not present

## 2015-06-07 DIAGNOSIS — G894 Chronic pain syndrome: Secondary | ICD-10-CM | POA: Diagnosis not present

## 2015-06-07 DIAGNOSIS — F331 Major depressive disorder, recurrent, moderate: Secondary | ICD-10-CM | POA: Diagnosis not present

## 2015-06-07 DIAGNOSIS — T4395XS Adverse effect of unspecified psychotropic drug, sequela: Secondary | ICD-10-CM | POA: Diagnosis not present

## 2015-06-07 DIAGNOSIS — L97811 Non-pressure chronic ulcer of other part of right lower leg limited to breakdown of skin: Secondary | ICD-10-CM | POA: Diagnosis not present

## 2015-06-07 DIAGNOSIS — Z4800 Encounter for change or removal of nonsurgical wound dressing: Secondary | ICD-10-CM | POA: Diagnosis not present

## 2015-06-07 DIAGNOSIS — J449 Chronic obstructive pulmonary disease, unspecified: Secondary | ICD-10-CM | POA: Diagnosis not present

## 2015-06-07 DIAGNOSIS — Z9181 History of falling: Secondary | ICD-10-CM | POA: Diagnosis not present

## 2015-06-07 DIAGNOSIS — G2401 Drug induced subacute dyskinesia: Secondary | ICD-10-CM | POA: Diagnosis not present

## 2015-06-07 DIAGNOSIS — J45909 Unspecified asthma, uncomplicated: Secondary | ICD-10-CM | POA: Diagnosis not present

## 2015-06-07 DIAGNOSIS — F1123 Opioid dependence with withdrawal: Secondary | ICD-10-CM | POA: Diagnosis not present

## 2015-06-07 DIAGNOSIS — I872 Venous insufficiency (chronic) (peripheral): Secondary | ICD-10-CM | POA: Diagnosis not present

## 2015-06-07 DIAGNOSIS — M160 Bilateral primary osteoarthritis of hip: Secondary | ICD-10-CM | POA: Diagnosis not present

## 2015-06-07 DIAGNOSIS — I10 Essential (primary) hypertension: Secondary | ICD-10-CM | POA: Diagnosis not present

## 2015-06-08 ENCOUNTER — Ambulatory Visit: Payer: Medicare Other | Admitting: Family Medicine

## 2015-06-08 DIAGNOSIS — M810 Age-related osteoporosis without current pathological fracture: Secondary | ICD-10-CM | POA: Diagnosis not present

## 2015-06-08 DIAGNOSIS — F331 Major depressive disorder, recurrent, moderate: Secondary | ICD-10-CM | POA: Diagnosis not present

## 2015-06-08 DIAGNOSIS — T4395XS Adverse effect of unspecified psychotropic drug, sequela: Secondary | ICD-10-CM | POA: Diagnosis not present

## 2015-06-08 DIAGNOSIS — J449 Chronic obstructive pulmonary disease, unspecified: Secondary | ICD-10-CM | POA: Diagnosis not present

## 2015-06-08 DIAGNOSIS — M160 Bilateral primary osteoarthritis of hip: Secondary | ICD-10-CM | POA: Diagnosis not present

## 2015-06-08 DIAGNOSIS — K279 Peptic ulcer, site unspecified, unspecified as acute or chronic, without hemorrhage or perforation: Secondary | ICD-10-CM | POA: Diagnosis not present

## 2015-06-08 DIAGNOSIS — G894 Chronic pain syndrome: Secondary | ICD-10-CM | POA: Diagnosis not present

## 2015-06-08 DIAGNOSIS — Z9181 History of falling: Secondary | ICD-10-CM | POA: Diagnosis not present

## 2015-06-08 DIAGNOSIS — I10 Essential (primary) hypertension: Secondary | ICD-10-CM | POA: Diagnosis not present

## 2015-06-08 DIAGNOSIS — J45909 Unspecified asthma, uncomplicated: Secondary | ICD-10-CM | POA: Diagnosis not present

## 2015-06-08 DIAGNOSIS — L97811 Non-pressure chronic ulcer of other part of right lower leg limited to breakdown of skin: Secondary | ICD-10-CM | POA: Diagnosis not present

## 2015-06-08 DIAGNOSIS — G2401 Drug induced subacute dyskinesia: Secondary | ICD-10-CM | POA: Diagnosis not present

## 2015-06-08 DIAGNOSIS — Z4800 Encounter for change or removal of nonsurgical wound dressing: Secondary | ICD-10-CM | POA: Diagnosis not present

## 2015-06-08 DIAGNOSIS — F1123 Opioid dependence with withdrawal: Secondary | ICD-10-CM | POA: Diagnosis not present

## 2015-06-08 DIAGNOSIS — I872 Venous insufficiency (chronic) (peripheral): Secondary | ICD-10-CM | POA: Diagnosis not present

## 2015-06-11 ENCOUNTER — Telehealth: Payer: Self-pay

## 2015-06-11 ENCOUNTER — Other Ambulatory Visit: Payer: Self-pay | Admitting: Family Medicine

## 2015-06-11 NOTE — Telephone Encounter (Signed)
PATIENT WANTS DR. Joseph Art TO KNOW THAT SHE NEEDS ANOTHER PRESCRIPTION WRITTEN FOR HER HYDROCODONE. HE WROTE HER PRESCRIPTION, HOWEVER, SHE IS NOT TO FILL IT UNTIL Monday 06/12/2015. THE PHARMACIST TOLD HER TODAY THAT SHE WILL HAVE TO GET A NEW PRESCRIPTION WRITTEN BECAUSE IT HAS BEEN OVER 72 HOURS. [name] SAID SHE DOES NOT UNDERSTAND WHY? BEST PHONE [phone] (THIS IS HER ROOMMATE'S CELL PHONE - NORMA PARKER) Cori'S CELL PHONE IS NOT WORKING. [place]

## 2015-06-18 DIAGNOSIS — J449 Chronic obstructive pulmonary disease, unspecified: Secondary | ICD-10-CM | POA: Diagnosis not present

## 2015-06-18 DIAGNOSIS — R634 Abnormal weight loss: Secondary | ICD-10-CM | POA: Diagnosis not present

## 2015-06-23 ENCOUNTER — Ambulatory Visit (INDEPENDENT_AMBULATORY_CARE_PROVIDER_SITE_OTHER): Payer: Medicare Other | Admitting: Family Medicine

## 2015-06-23 VITALS — BP 126/70 | HR 90 | Temp 98.8°F | Resp 14 | Ht 65.0 in | Wt 94.2 lb

## 2015-06-23 DIAGNOSIS — G894 Chronic pain syndrome: Secondary | ICD-10-CM | POA: Diagnosis not present

## 2015-06-23 DIAGNOSIS — R5383 Other fatigue: Secondary | ICD-10-CM | POA: Diagnosis not present

## 2015-06-23 DIAGNOSIS — R636 Underweight: Secondary | ICD-10-CM

## 2015-06-23 DIAGNOSIS — F4323 Adjustment disorder with mixed anxiety and depressed mood: Secondary | ICD-10-CM | POA: Diagnosis not present

## 2015-06-23 MED ORDER — ALPRAZOLAM 1 MG PO TABS: 1.0000 mg | ORAL_TABLET | Freq: Three times a day (TID) | ORAL | Status: DC | PRN

## 2015-06-23 MED ORDER — HYDROCODONE-ACETAMINOPHEN 10-325 MG PO TABS: 1.0000 | ORAL_TABLET | Freq: Four times a day (QID) | ORAL | Status: DC | PRN

## 2015-06-23 MED ORDER — OLANZAPINE 10 MG PO TABS: 10.0000 mg | ORAL_TABLET | Freq: Every day | ORAL | Status: DC

## 2015-06-23 MED ORDER — DULOXETINE HCL 60 MG PO CPEP: 60.0000 mg | ORAL_CAPSULE | Freq: Every day | ORAL | Status: DC

## 2015-06-23 MED ORDER — BOOST / RESOURCE BREEZE PO LIQD: 1.0000 | Freq: Three times a day (TID) | ORAL | Status: DC

## 2015-06-23 NOTE — Progress Notes (Addendum)
Subjective:    Patient ID: [name], female    DOB: September 05, 1950, 64 y.o.   MRN: [id number] By signing my name below, I, Zola Button, attest that this documentation has been prepared under the direction and in the presence of [name], MD.  Electronically Signed: Zola Button, Medical Scribe. 06/23/2015. 8:54 AM.  Chief Complaint  Patient presents with  . Medication Refill    boost, zofran, xanax and hydrocodone    HPI HPI Comments: [name] is a 64 y.o. female with a history of Barrett's esophagus, chronic abdominal pain, severe protein-calorie malnutrition, vitamin D deficiency, depression and anxiety who presents to the [facility] for a medication refill for Boost, Zofran, Xanax, and hydrocodone. Patient states she is doing well and has been eating well. She has not had any recent leg swelling and has not had to take the Lasix. She is still taking the weekly vitamin D. She does not remember how she did on the Dilaudid. Patient did have her Xanax refilled 20 days ago by Dr. Joseph Art, but she states she did not realize she was given 5 additional refills and threw away the bottle. She has not been taking the Miralax.   Past Medical History  Diagnosis Date  . Peptic ulcer disease     dr Oletta Lamas  . Acute duodenal ulcer with hemorrhage and perforation, with obstruction ([place])   . Barrett's esophagus   . Menopause   . GERD (gastroesophageal reflux disease)   . Asthma   . Chronic abdominal pain     narcotic dependence, dr gyarteng-dak at [facility]  . Narcotic abuse   . Anxiety     sees [name] NP at Dr. Radonna Ricker office  . Fx two ribs-open 08-24-11    left 4th and 5th  . Fx of fibula 07-28-11    left fibula, 2 places   . COPD (chronic obstructive pulmonary disease) ([place])   . Allergy   . Anemia   . History of blood transfusion     "related to OR; maybe when I perforated that ulcer"  . Lap Nissen + truncal vagotomy July 2008 05/13/2013  . LEG  EDEMA 01/19/2008    Qualifier: Diagnosis of  By: [name] MD, [name]   . ACUTE DUODEN ULCER W/HEMORR PERF&OBSTRUCTION 06/26/2004    Qualifier: Diagnosis of  By: [name] MD, [name]   . FEVER, RECURRENT 08/11/2009    Qualifier: Diagnosis of  By: [name] MD, [name] MENOPAUSE 01/07/2007    Qualifier: Diagnosis of  By: [name], CMA, [place]    . Gastroparesis 06/03/2007    Qualifier: Diagnosis of  By: [name] MD, [name]   . GASTRIC OUTLET OBSTRUCTION 08/28/2007    In July 2008 she underwent laparoscopic enterolysis, Nissen fundoplication over a [id number] bougie, single pledgeted suture colosure of the hiatus and a 3 suture wrap.  Lap truncal vagotomy and a loop gastrojejunostomy was performed.  This was revised in August of 2009 to a roux en Y gastrojejujostomy.     . 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 Depression   . History of hiatal hernia   . Complication of anesthesia     pt states had too much xanax on board - agitated   . Anginal pain ([place])     "many many years ago"  . Exertional shortness of breath   . Pneumonia, organism unspecified 11/07/2010    Assoc with R Parapneumonic effusion  09/2010    - Tapped 10/05/10    - CxR resolved 11/07/2010    . Headache     "monthly" (09/06/2014)  . Arthritis     "hips" (09/06/2014)  . Myocardial infarction ([place])   . S/P jejunostomy Feb 2016 07/26/2014   Past Surgical History  Procedure Laterality Date  . Esophagogastroduodenoscopy  12-29-09    dr qadeer at [facility], several gastric ulcers  . Laparoscopic nissen fundoplication    . Gastrojejunostomy  02-20-08    Roux en Y gastrojejunsotomy w 1 foot Roux limb  . Repair of perforated ulcer    . Biopsy thyroid  08-13-11    benign nodule, per Dr. Melida Quitter   . Esophagogastroduodenoscopy N/A 09/25/2012    Procedure: ESOPHAGOGASTRODUODENOSCOPY (EGD);  Surgeon: Beryle Beams, MD;  Location: [facility];  Service: Endoscopy;  Laterality: N/A;  . Fracture surgery    . Tonsillectomy    .  Cataract extraction w/ intraocular lens  implant, bilateral Bilateral   . Laparoscopic partial gastrectomy N/A 06/29/2013    Procedure: Gastrectomy and GJ Tube placement;  Surgeon: Pedro Earls, MD;  Location: [facility];  Service: General;  Laterality: N/A;  . Colonoscopy    . Gastrostomy N/A 07/26/2014    Procedure: LAPRASCOPIC ASSISTED OPEN PLACEMENT OF JEJUNOSTOMY TUBE;  Surgeon: Pedro Earls, MD;  Location: [facility];  Service: General;  Laterality: N/A;  . Orif hip fracture Bilateral     "pins in both of them"  . Dilation and curettage of uterus    . Hernia repair      "hiatal"  . Joint replacement     Current Outpatient Prescriptions on File Prior to Visit  Medication Sig Dispense Refill  . Calcium Citrate-Vitamin D (CALCIUM CITRATE CHEWY BITE) 500-500 MG-UNIT CHEW Chew 1 tablet by mouth 2 (two) times daily. 180 tablet 3  . ipratropium-albuterol (DUONEB) 0.5-2.5 (3) MG/3ML SOLN Take 3 mLs by nebulization 4 (four) times daily -  before meals and at bedtime. 360 mL 3  . ondansetron (ZOFRAN ODT) 4 MG disintegrating tablet Take 1-2 tablets (4-8 mg total) by mouth every 8 (eight) hours as needed for nausea or vomiting. 180 tablet 0  . pantoprazole (PROTONIX) 40 MG tablet Take 1 tablet (40 mg total) by mouth 2 (two) times daily before a meal. 180 tablet 3  . polyethylene glycol powder (GLYCOLAX/MIRALAX) powder Take 17 g by mouth 2 (two) times daily as needed. 850 g 11  . Aspirin-Acetaminophen-Caffeine (GOODY HEADACHE PO) Take 1 packet by mouth 4 (four) times daily as needed (pain). Reported on 06/23/2015    . ergocalciferol (VITAMIN D2) 50000 UNITS capsule Take 1 capsule (50,000 Units total) by mouth once a week. (Patient not taking: Reported on 06/23/2015) 24 capsule 0  . furosemide (LASIX) 20 MG tablet Take 1 tablet (20 mg) by mouth with breakfast and/or lunch as needed for swelling (Patient not taking: Reported on 06/23/2015) 60 tablet 0  . potassium chloride SA (K-DUR,KLOR-CON) 20 MEQ tablet  Take 1 tablet (20 mEq total) by mouth 2 (two) times daily. 60 tablet 0   Current Facility-Administered Medications on File Prior to Visit  Medication Dose Route Frequency Provider Last Rate Last Dose  . cyanocobalamin ((VITAMIN B-12)) injection 1,000 mcg  1,000 mcg Intramuscular Q30 days Robyn Haber, MD   1,000 mcg at 06/04/15 0841   Allergies  Allergen Reactions  . Lithium Nausea And Vomiting  . Celebrex [Celecoxib] Other (See Comments)    History of bleeding ulcers  .  Morphine Itching  . Quetiapine Other (See Comments)     tardive dyskinesia  . Varenicline Tartrate Other (See Comments)    hallucinations   Family History  Problem Relation Age of Onset  . Cancer Mother     kidney, magliant tumor on face  . Stroke Mother   . Celiac disease Father   . Cancer Father     kidney and pancreatic   Social History   Social History  . Marital Status: Divorced    Spouse Name: N/A  . Number of Children: N/A  . Years of Education: N/A   Social History Main Topics  . Smoking status: Current Every Day Smoker -- 2.00 packs/day for 50 years    Types: Cigarettes  . Smokeless tobacco: Never Used  . Alcohol Use: No  . Drug Use: No  . Sexual Activity: No   Other Topics Concern  . None   Social History Narrative     Wt Readings from Last 3 Encounters:  06/23/15 94 lb 3.2 oz (42.729 kg)  06/04/15 93 lb 12.8 oz (42.547 kg)  06/03/15 91 lb 6.4 oz (41.459 kg)     Review of Systems  Constitutional: Positive for fatigue. Negative for fever, chills, activity change, appetite change and unexpected weight change.  Respiratory: Positive for cough. Negative for chest tightness and shortness of breath.   Cardiovascular: Negative for leg swelling.  Gastrointestinal: Positive for nausea, abdominal pain and constipation. Negative for vomiting, diarrhea and abdominal distention.  Musculoskeletal: Positive for back pain and arthralgias. Negative for joint swelling and gait problem.    Neurological: Negative for dizziness, syncope, weakness and light-headedness.  Hematological: Negative for adenopathy. Bruises/bleeds easily.  Psychiatric/Behavioral: Positive for confusion and sleep disturbance. Negative for dysphoric mood and decreased concentration. The patient is not nervous/anxious.        Objective:  BP 126/70 mmHg  Pulse 90  Temp(Src) 98.8 F (37.1 C) (Oral)  Resp 14  Ht 5\' 5"  (1.651 m)  Wt 94 lb 3.2 oz (42.729 kg)  BMI 15.68 kg/m2  SpO2 93%  Physical Exam  Constitutional: She is oriented to person, place, and time. She appears well-developed and well-nourished. No distress.  HENT:  Head: Normocephalic and atraumatic.  Mouth/Throat: Oropharynx is clear and moist. No oropharyngeal exudate.  Eyes: Pupils are equal, round, and reactive to light.  Neck: Neck supple.  Cardiovascular: Normal rate.   Pulmonary/Chest: Effort normal.  Musculoskeletal: She exhibits no edema.  Neurological: She is alert and oriented to person, place, and time. No cranial nerve deficit.  Skin: Skin is warm and dry. No rash noted.  Psychiatric: She has a normal mood and affect. Her behavior is normal.  Nursing note and vitals reviewed.         Assessment & Plan:   1. Adjustment disorder with mixed anxiety and depressed mood   2. Chronic pain syndrome   3. Underweight   4. Lethargic   [name] is doing the best I have seen her in quite some time - well over 6 mos. I suspect it is that the cymbalta finally kicked in as she has been on max dose for just a mo. Continue current regimen. She is changing her pharmacy from [facility] to [facility]. Cont once weekly vit D Cont breeze prot supp drinks 2x/d. If she is still doing well next mo, ok to call for refill on her chronic pain medicine. If she is having any symptoms or concerns, needs to come in for eval. Pt requests  changing med to percocet but she did NOT do well prior. She did ok to dilaudid but hydrocodone more  effective.  Today I have utilized the [place] Controlled Substance Registry's online query to confirm compliance regarding the patient's narcotic pain medications. My review reveals appropriate prescription fills and that [facility] is the sole provider of these medications. Rechecks will occur regularly and the patient is aware of our use of the system.   Meds ordered this encounter  Medications  . ALPRAZolam (XANAX) 1 MG tablet    Sig: Take 1 tablet (1 mg total) by mouth 3 (three) times daily as needed for anxiety. This is a 1 month supply    Dispense:  90 tablet    Refill:  0  . OLANZapine (ZYPREXA) 10 MG tablet    Sig: Take 1 tablet (10 mg total) by mouth at bedtime.    Dispense:  30 tablet    Refill:  1    Failed risperdal, cannot tolerate seroquel due to tardive dyskinesia  . HYDROcodone-acetaminophen (NORCO) 10-325 MG tablet    Sig: Take 1-2 tablets by mouth every 6 (six) hours as needed for moderate pain. This is a 1 month supply.    Dispense:  240 tablet    Refill:  0    May fill today  . DULoxetine (CYMBALTA) 60 MG capsule    Sig: Take 1 capsule (60 mg total) by mouth daily.    Dispense:  30 capsule    Refill:  5  . feeding supplement (BOOST / RESOURCE BREEZE) LIQD    Sig: Take 1 Container by mouth 3 (three) times daily between meals.    Dispense:  90 Container    Refill:  [place] to substitute a similar product if cost for pt is better and change is acceptable to pt    I personally performed the services described in this documentation, which was scribed in my presence. The recorded information has been reviewed and considered, and addended by me as needed.  [name], MD MPH

## 2015-06-30 ENCOUNTER — Telehealth: Payer: Self-pay

## 2015-06-30 DIAGNOSIS — G894 Chronic pain syndrome: Secondary | ICD-10-CM

## 2015-06-30 NOTE — Telephone Encounter (Signed)
Patient has misplaced her hydrocodone prescription and wants to know if she can get another one.

## 2015-06-30 NOTE — Telephone Encounter (Signed)
Dr. Brigitte Pulse pt. Please advise.

## 2015-06-30 NOTE — Telephone Encounter (Signed)
Dr Brigitte Pulse needs to respond to this because it was written on 12/30

## 2015-07-01 NOTE — Telephone Encounter (Signed)
Ok to replace as long as someone checks on the controlled database to ensure it wasn't filled and let pt know that if she does get a new replacement rx and finds the old one, she needs to shred it as we will be able to tell through the database if filled. I will not be in the office until Mon evening so please ask colleague to write on my behalf if it has not been filled. Thanks.

## 2015-07-03 ENCOUNTER — Ambulatory Visit: Payer: Self-pay | Admitting: *Deleted

## 2015-07-03 MED ORDER — HYDROCODONE-ACETAMINOPHEN 10-325 MG PO TABS: 1.0000 | ORAL_TABLET | Freq: Four times a day (QID) | ORAL | Status: DC | PRN

## 2015-07-03 NOTE — Telephone Encounter (Signed)
Advised pt Rx was ready to pick up.

## 2015-07-03 NOTE — Telephone Encounter (Addendum)
I will get this done for Dr. Brigitte Pulse today.    UPDATED: Per the database, [name] got 240 pills of hydrocodone on 06/08/2015 from Dr. Joseph Art. She has not yet filled the script from Dr. Brigitte Pulse on 06/23/2015. I will refill for 120 pills.

## 2015-07-03 NOTE — Telephone Encounter (Signed)
Patient informed me that she found Rx, please disregard.

## 2015-07-03 NOTE — Telephone Encounter (Signed)
Please discard the last script for hydrocodone. Thank you.

## 2015-07-03 NOTE — Telephone Encounter (Signed)
Done

## 2015-07-06 NOTE — Telephone Encounter (Signed)
In error

## 2015-07-19 ENCOUNTER — Telehealth: Payer: Self-pay

## 2015-07-19 DIAGNOSIS — J449 Chronic obstructive pulmonary disease, unspecified: Secondary | ICD-10-CM | POA: Diagnosis not present

## 2015-07-19 NOTE — Telephone Encounter (Signed)
Pt would like to speak with the Dr or the asst regarding about her visit. Please call [phone]

## 2015-07-20 ENCOUNTER — Ambulatory Visit (INDEPENDENT_AMBULATORY_CARE_PROVIDER_SITE_OTHER): Payer: Medicare Other | Admitting: Family Medicine

## 2015-07-20 VITALS — BP 110/68 | Temp 99.1°F | Resp 16 | Ht 65.0 in | Wt 100.0 lb

## 2015-07-20 DIAGNOSIS — J069 Acute upper respiratory infection, unspecified: Secondary | ICD-10-CM | POA: Diagnosis not present

## 2015-07-20 DIAGNOSIS — F4323 Adjustment disorder with mixed anxiety and depressed mood: Secondary | ICD-10-CM

## 2015-07-20 DIAGNOSIS — R5383 Other fatigue: Secondary | ICD-10-CM | POA: Diagnosis not present

## 2015-07-20 DIAGNOSIS — R636 Underweight: Secondary | ICD-10-CM | POA: Diagnosis not present

## 2015-07-20 DIAGNOSIS — R509 Fever, unspecified: Secondary | ICD-10-CM | POA: Diagnosis not present

## 2015-07-20 DIAGNOSIS — G894 Chronic pain syndrome: Secondary | ICD-10-CM | POA: Diagnosis not present

## 2015-07-20 LAB — POCT INFLUENZA A/B
Influenza A, POC: NEGATIVE
Influenza B, POC: NEGATIVE

## 2015-07-20 MED ORDER — ALPRAZOLAM 1 MG PO TABS: 1.0000 mg | ORAL_TABLET | Freq: Three times a day (TID) | ORAL | Status: DC | PRN

## 2015-07-20 MED ORDER — PROMETHAZINE-PHENYLEPHRINE 6.25-5 MG/5ML PO SYRP: 5.0000 mL | ORAL_SOLUTION | ORAL | Status: DC | PRN

## 2015-07-20 MED ORDER — GUAIFENESIN ER 1200 MG PO TB12: 1.0000 | ORAL_TABLET | Freq: Two times a day (BID) | ORAL | Status: DC | PRN

## 2015-07-20 MED ORDER — BOOST / RESOURCE BREEZE PO LIQD: 1.0000 | Freq: Three times a day (TID) | ORAL | Status: DC

## 2015-07-20 MED ORDER — ONDANSETRON 4 MG PO TBDP: 4.0000 mg | ORAL_TABLET | Freq: Three times a day (TID) | ORAL | Status: DC | PRN

## 2015-07-20 MED ORDER — HYDROCODONE-ACETAMINOPHEN 10-325 MG PO TABS: 1.0000 | ORAL_TABLET | Freq: Four times a day (QID) | ORAL | Status: DC | PRN

## 2015-07-20 NOTE — Telephone Encounter (Signed)
Spoke with pt, pt stated she would call Dr. Brigitte Pulse back?

## 2015-07-20 NOTE — Patient Instructions (Addendum)
Start taking 5000u of over-the-counter vitamin D daily.  Upper Respiratory Infection, Adult Most upper respiratory infections (URIs) are a viral infection of the air passages leading to the lungs. A URI affects the nose, throat, and upper air passages. The most common type of URI is nasopharyngitis and is typically referred to as "the common cold." URIs run their course and usually go away on their own. Most of the time, a URI does not require medical attention, but sometimes a bacterial infection in the upper airways can follow a viral infection. This is called a secondary infection. Sinus and middle ear infections are common types of secondary upper respiratory infections. Bacterial pneumonia can also complicate a URI. A URI can worsen asthma and chronic obstructive pulmonary disease (COPD). Sometimes, these complications can require emergency medical care and may be life threatening.  CAUSES Almost all URIs are caused by viruses. A virus is a type of germ and can spread from one person to another.  RISKS FACTORS You may be at risk for a URI if:   You smoke.   You have chronic heart or lung disease.  You have a weakened defense (immune) system.   You are very young or very old.   You have nasal allergies or asthma.  You work in crowded or poorly ventilated areas.  You work in health care facilities or schools. SIGNS AND SYMPTOMS  Symptoms typically develop 2-3 days after you come in contact with a cold virus. Most viral URIs last 7-10 days. However, viral URIs from the influenza virus (flu virus) can last 14-18 days and are typically more severe. Symptoms may include:   Runny or stuffy (congested) nose.   Sneezing.   Cough.   Sore throat.   Headache.   Fatigue.   Fever.   Loss of appetite.   Pain in your forehead, behind your eyes, and over your cheekbones (sinus pain).  Muscle aches.  DIAGNOSIS  Your health care provider may diagnose a URI by:  Physical  exam.  Tests to check that your symptoms are not due to another condition such as:  Strep throat.  Sinusitis.  Pneumonia.  Asthma. TREATMENT  A URI goes away on its own with time. It cannot be cured with medicines, but medicines may be prescribed or recommended to relieve symptoms. Medicines may help:  Reduce your fever.  Reduce your cough.  Relieve nasal congestion. HOME CARE INSTRUCTIONS   Take medicines only as directed by your health care provider.   Gargle warm saltwater or take cough drops to comfort your throat as directed by your health care provider.  Use a warm mist humidifier or inhale steam from a shower to increase air moisture. This may make it easier to breathe.  Drink enough fluid to keep your urine clear or pale yellow.   Eat soups and other clear broths and maintain good nutrition.   Rest as needed.   Return to work when your temperature has returned to normal or as your health care provider advises. You may need to stay home longer to avoid infecting others. You can also use a face mask and careful hand washing to prevent spread of the virus.  Increase the usage of your inhaler if you have asthma.   Do not use any tobacco products, including cigarettes, chewing tobacco, or electronic cigarettes. If you need help quitting, ask your health care provider. PREVENTION  The best way to protect yourself from getting a cold is to practice good hygiene.  Avoid oral or hand contact with people with cold symptoms.   Wash your hands often if contact occurs.  There is no clear evidence that vitamin C, vitamin E, echinacea, or exercise reduces the chance of developing a cold. However, it is always recommended to get plenty of rest, exercise, and practice good nutrition.  SEEK MEDICAL CARE IF:   You are getting worse rather than better.   Your symptoms are not controlled by medicine.   You have chills.  You have worsening shortness of breath.  You  have brown or red mucus.  You have yellow or brown nasal discharge.  You have pain in your face, especially when you bend forward.  You have a fever.  You have swollen neck glands.  You have pain while swallowing.  You have white areas in the back of your throat. SEEK IMMEDIATE MEDICAL CARE IF:   You have severe or persistent:  Headache.  Ear pain.  Sinus pain.  Chest pain.  You have chronic lung disease and any of the following:  Wheezing.  Prolonged cough.  Coughing up blood.  A change in your usual mucus.  You have a stiff neck.  You have changes in your:  Vision.  Hearing.  Thinking.  Mood. MAKE SURE YOU:   Understand these instructions.  Will watch your condition.  Will get help right away if you are not doing well or get worse.   This information is not intended to replace advice given to you by your health care provider. Make sure you discuss any questions you have with your health care provider.   Document Released: 12/04/2000 Document Revised: 10/25/2014 Document Reviewed: 09/15/2013 Elsevier Interactive Patient Education [employer].

## 2015-07-20 NOTE — Telephone Encounter (Signed)
Left message for pt to call back  °

## 2015-07-20 NOTE — Progress Notes (Signed)
Subjective:  By signing my name below, I, [name], attest that this documentation has been prepared under the direction and in the presence of [name], MD. Electronically Signed: Moises [name], [place]. 07/20/2015 , 3:53 PM .  Patient was seen in Room 8 .   Patient ID: [name], female    DOB: May 12, 1951, 65 y.o.   MRN: [id number] Chief Complaint  Patient presents with  . Fever    x 4 days  . Generalized Body Aches    x 4 days  . Medication Refill   HPI [name] is a 65 y.o. female who presents to [facility] complaining of fever with general myalgia that started 4 days ago.  She feels like she has the flu. She's been running a low grade fever (Tmax99) She has some myalgia, sinus pressure, shortness of breath and congestion. She's able to keep fluids and foods down. She denies any abdominal pain, constipation, cough, sore throat, chest pain, palpitations, urinary symptoms, and sleep disturbances. She denies taking any OTC medications for this.   She also requested a medication refill.   Past Medical History  Diagnosis Date  . Peptic ulcer disease     dr Oletta Lamas  . Acute duodenal ulcer with hemorrhage and perforation, with obstruction ([place])   . Barrett's esophagus   . Menopause   . GERD (gastroesophageal reflux disease)   . Asthma   . Chronic abdominal pain     narcotic dependence, dr gyarteng-dak at [facility]  . Narcotic abuse   . Anxiety     sees [name] NP at Dr. Radonna Ricker office  . Fx two ribs-open 08-24-11    left 4th and 5th  . Fx of fibula 07-28-11    left fibula, 2 places   . COPD (chronic obstructive pulmonary disease) ([place])   . Allergy   . Anemia   . History of [name] transfusion     "related to OR; maybe when I perforated that ulcer"  . Lap Nissen + truncal vagotomy July 2008 05/13/2013  . LEG EDEMA 01/19/2008    Qualifier: Diagnosis of  By: [name] MD, [name]   . ACUTE DUODEN ULCER W/HEMORR PERF&OBSTRUCTION 06/26/2004    Qualifier:  Diagnosis of  By: [name] MD, [name]   . FEVER, RECURRENT 08/11/2009    Qualifier: Diagnosis of  By: [name] MD, [name] MENOPAUSE 01/07/2007    Qualifier: Diagnosis of  By: [name], CMA, [place]    . Gastroparesis 06/03/2007    Qualifier: Diagnosis of  By: [name] MD, [name]   . GASTRIC OUTLET OBSTRUCTION 08/28/2007    In July 2008 she underwent laparoscopic enterolysis, Nissen fundoplication over a [id number] bougie, single pledgeted suture colosure of the hiatus and a 3 suture wrap.  Lap truncal vagotomy and a loop gastrojejunostomy was performed.  This was revised in August of 2009 to a roux en Y gastrojejujostomy.     . 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 Depression   . History of hiatal hernia   . Complication of anesthesia     pt states had too much xanax on board - agitated   . Anginal pain ([place])     "many many years ago"  . Exertional shortness of breath   . Pneumonia, organism unspecified 11/07/2010    Assoc with R Parapneumonic effusion 09/2010    - Tapped 10/05/10    - CxR resolved 11/07/2010    . Headache     "  monthly" (09/06/2014)  . Arthritis     "hips" (09/06/2014)  . Myocardial infarction ([place])   . 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 Authorizing Provider  ALPRAZolam [name]) 1 MG tablet Take 1 tablet (1 mg total) by mouth 3 (three) times daily as needed for anxiety. This is a 1 month supply 06/23/15   Shawnee Knapp, MD  Aspirin-Acetaminophen-Caffeine (GOODY HEADACHE PO) Take 1 packet by mouth 4 (four) times daily as needed (pain). Reported on 06/23/2015    Historical Provider, MD  Calcium Citrate-Vitamin D (CALCIUM CITRATE CHEWY BITE) 500-500 MG-UNIT CHEW Chew 1 tablet by mouth 2 (two) times daily. 04/17/15   Shawnee Knapp, MD  DULoxetine (CYMBALTA) 60 MG capsule Take 1 capsule (60 mg total) by mouth daily. 06/23/15   Shawnee Knapp, MD  ergocalciferol (VITAMIN D2) 50000 UNITS capsule Take 1  capsule (50,000 Units total) by mouth once a week. Patient not taking: Reported on 06/23/2015 03/05/15   Shawnee Knapp, MD  feeding supplement (BOOST / RESOURCE BREEZE) LIQD Take 1 Container by mouth 3 (three) times daily between meals. 06/23/15   Shawnee Knapp, MD  furosemide (LASIX) 20 MG tablet Take 1 tablet (20 mg) by mouth with breakfast and/or lunch as needed for swelling Patient not taking: Reported on 06/23/2015 04/12/15   Shawnee Knapp, MD  HYDROcodone-acetaminophen [facility]) 10-325 MG tablet Take 1-2 tablets by mouth every 6 (six) hours as needed for moderate pain. This is a 1 month supply. 07/03/15   Jaynee Eagles, PA-C  ipratropium-albuterol (DUONEB) 0.5-2.5 (3) MG/3ML SOLN Take 3 mLs by nebulization 4 (four) times daily -  before meals and at bedtime. 06/03/15   Robyn Haber, MD  OLANZapine (ZYPREXA) 10 MG tablet Take 1 tablet (10 mg total) by mouth at bedtime. 06/23/15   Shawnee Knapp, MD  ondansetron (ZOFRAN ODT) 4 MG disintegrating tablet Take 1-2 tablets (4-8 mg total) by mouth every 8 (eight) hours as needed for nausea or vomiting. 06/04/15   Shawnee Knapp, MD  pantoprazole (PROTONIX) 40 MG tablet Take 1 tablet (40 mg total) by mouth 2 (two) times daily before a meal. 06/03/15   Robyn Haber, MD  polyethylene glycol powder (GLYCOLAX/MIRALAX) powder Take 17 g by mouth 2 (two) times daily as needed. 04/12/15   Shawnee Knapp, MD  potassium chloride SA (K-DUR,KLOR-CON) 20 MEQ tablet Take 1 tablet (20 mEq total) by mouth 2 (two) times daily. 04/12/15   Shawnee Knapp, MD   Allergies  Allergen Reactions  . Lithium Nausea And Vomiting  . Celebrex [Celecoxib] Other (See Comments)    History of bleeding ulcers  . Morphine Itching  . Quetiapine Other (See Comments)     tardive dyskinesia  . Varenicline Tartrate Other (See Comments)    hallucinations    Review of Systems  Constitutional: Positive for fever and fatigue. Negative for chills, diaphoresis and appetite change.  HENT: Positive for congestion and  sinus pressure. Negative for sore throat.   Respiratory: Positive for shortness of breath. Negative for cough and wheezing.   Cardiovascular: Negative for chest pain and palpitations.  Gastrointestinal: Negative for abdominal pain, diarrhea and constipation.  Genitourinary: Negative for dysuria, urgency and frequency.  Musculoskeletal: Positive for myalgias.  Psychiatric/Behavioral: Negative for sleep disturbance.       Objective:   Physical Exam  Constitutional: She is oriented to person, place, and time. She appears well-developed and well-nourished.  No distress.  HENT:  Head: Normocephalic and atraumatic.  Right Ear: Tympanic membrane normal.  Nose: Mucosal edema present.  Mouth/Throat: Mucous membranes are dry. Posterior oropharyngeal edema present.  left ear filled cerumen  Eyes: EOM are normal. Pupils are equal, round, and reactive to light.  Neck: Neck supple.  Cardiovascular: Normal rate and regular rhythm.   Murmur (left upper sternum border murmur) heard.  Systolic murmur is present with a grade of 1/6  Pulmonary/Chest: Effort normal. No respiratory distress. She has no decreased breath sounds. She has no wheezes.  Abdominal: She exhibits no mass. Bowel sounds are increased. There is no hepatosplenomegaly. There is no tenderness. There is no CVA tenderness.  Musculoskeletal: Normal range of motion.  Neurological: She is alert and oriented to person, place, and time.  Skin: Skin is warm and dry.  Psychiatric: She has a normal mood and affect. Her behavior is normal.  Nursing note and vitals reviewed.   BP 110/68 mmHg  Temp(Src) 99.1 F (37.3 C) (Oral)  Resp 16  Ht 5\' 5"  (1.651 m)  Wt 100 lb (45.36 kg)  BMI 16.64 kg/m2    Results for orders placed or performed in visit on 07/20/15  POCT Influenza A/B  Result Value Ref Range   Influenza A, POC Negative Negative   Influenza B, POC Negative Negative    Assessment & Plan:   1. Fever, unspecified - acute URI viral  syndrome, no flair of copd - actually seems to be doing well - so push fluids, cont high prot and high calorie diet, and treat symptomatically.  2. Adjustment disorder with mixed anxiety and depressed mood   3. Chronic pain syndrome   4. Acute upper respiratory infection   5. Lethargic   6. Underweight     Today I have utilized the [place] Controlled Substance Registry's online query to confirm compliance regarding the patient's narcotic pain medications. My review reveals appropriate prescription fills and that [facility] is the sole provider of these medications. Rechecks will occur regularly and the patient is aware of our use of the system. Pt last filled rx for #240 hydrocodone 10mg  from me on 1/13 and alprazolam 1mg  #90 on 1/10 so given rx to fill in 2 wks - on 2/10 for each.  Orders Placed This Encounter  Procedures  . POCT Influenza A/B    Meds ordered this encounter  Medications  . ALPRAZolam (XANAX) 1 MG tablet    Sig: Take 1 tablet (1 mg total) by mouth 3 (three) times daily as needed for anxiety. This is a 1 month supply    Dispense:  90 tablet    Refill:  0    May fill August 04, 2015  . HYDROcodone-acetaminophen (NORCO) 10-325 MG tablet    Sig: Take 1-2 tablets by mouth every 6 (six) hours as needed for moderate pain. This is a 1 month supply.    Dispense:  240 tablet    Refill:  0    May fill August 04, 2015  . promethazine-phenylephrine (PROMETHAZINE-PHENYLEPHRINE) 6.25-5 MG/5ML SYRP    Sig: Take 5 mLs by mouth every 4 (four) hours as needed for congestion.    Dispense:  280 mL    Refill:  0  . Guaifenesin (MUCINEX MAXIMUM STRENGTH) 1200 MG TB12    Sig: Take 1 tablet (1,200 mg total) by mouth every 12 (twelve) hours as needed.    Dispense:  14 tablet    Refill:  1  . ondansetron (ZOFRAN  ODT) 4 MG disintegrating tablet    Sig: Take 1-2 tablets (4-8 mg total) by mouth every 8 (eight) hours as needed for nausea or vomiting.    Dispense:  180 tablet     Refill:  0  . DISCONTD: feeding supplement (BOOST / RESOURCE BREEZE) LIQD    Sig: Take 1 Container by mouth 3 (three) times daily between meals.    Dispense:  90 Container    Refill:  [place] to substitute a similar product if cost for pt is better and change is acceptable to pt  . feeding supplement (BOOST / RESOURCE BREEZE) LIQD    Sig: Take 1 Container by mouth 3 (three) times daily between meals.    Dispense:  90 Container    Refill:  [place] to substitute a similar product if cost for pt is better and change is acceptable to pt    I personally performed the services described in this documentation, which was scribed in my presence. The recorded information has been reviewed and considered, and addended by me as needed.  [name], MD MPH

## 2015-07-26 ENCOUNTER — Telehealth: Payer: Self-pay

## 2015-07-26 NOTE — Telephone Encounter (Signed)
Patient cannot find her prescriptions Dr. Brigitte Pulse gave her last week.   Xanax and hydrocodone  Please call [phone] (M)

## 2015-07-27 ENCOUNTER — Ambulatory Visit: Payer: Medicare Other | Admitting: Family Medicine

## 2015-07-28 ENCOUNTER — Ambulatory Visit: Payer: Medicare Other

## 2015-07-30 ENCOUNTER — Ambulatory Visit (INDEPENDENT_AMBULATORY_CARE_PROVIDER_SITE_OTHER): Payer: Medicare Other | Admitting: Family Medicine

## 2015-07-30 VITALS — BP 126/70 | HR 107 | Temp 98.7°F | Resp 18 | Ht 63.0 in | Wt 95.0 lb

## 2015-07-30 DIAGNOSIS — F4323 Adjustment disorder with mixed anxiety and depressed mood: Secondary | ICD-10-CM | POA: Diagnosis not present

## 2015-07-30 DIAGNOSIS — G894 Chronic pain syndrome: Secondary | ICD-10-CM

## 2015-07-30 DIAGNOSIS — J069 Acute upper respiratory infection, unspecified: Secondary | ICD-10-CM | POA: Diagnosis not present

## 2015-07-30 MED ORDER — IPRATROPIUM BROMIDE 0.02 % IN SOLN
0.5000 mg | Freq: Once | RESPIRATORY_TRACT | Status: AC
  Administered 2015-07-30: 0.5 mg via RESPIRATORY_TRACT

## 2015-07-30 MED ORDER — ALBUTEROL SULFATE (2.5 MG/3ML) 0.083% IN NEBU
2.5000 mg | INHALATION_SOLUTION | Freq: Once | RESPIRATORY_TRACT | Status: AC
  Administered 2015-07-30: 2.5 mg via RESPIRATORY_TRACT

## 2015-07-30 MED ORDER — IPRATROPIUM BROMIDE 0.02 % IN SOLN: 0.5000 mg | Freq: Four times a day (QID) | RESPIRATORY_TRACT | Status: DC

## 2015-07-30 MED ORDER — ALPRAZOLAM 1 MG PO TABS: 1.0000 mg | ORAL_TABLET | Freq: Three times a day (TID) | ORAL | Status: DC | PRN

## 2015-07-30 MED ORDER — HYDROCODONE-ACETAMINOPHEN 10-325 MG PO TABS: 1.0000 | ORAL_TABLET | Freq: Four times a day (QID) | ORAL | Status: DC | PRN

## 2015-07-30 MED ORDER — HYDROCODONE-HOMATROPINE 5-1.5 MG/5ML PO SYRP: 5.0000 mL | ORAL_SOLUTION | Freq: Three times a day (TID) | ORAL | Status: DC | PRN

## 2015-07-30 NOTE — Progress Notes (Signed)
Subjective:    Patient ID: [name], female    DOB: 1951/01/11, 65 y.o.   MRN: [id number] By signing my name below, I, [name], attest that this documentation has been prepared under the direction and in the presence of [name] Cheadle, MD. Electronically Signed: Judithe [name], ER Scribe. 07/30/2015. 9:25 AM.  Chief Complaint  Patient presents with  . Generalized Body Aches  . Cough  . Nasal Congestion  . Fever    Unspecified  . Medication Refill    Pt states she lost xanax, hydrocodone    HPI HPI Comments: [name] is a 65 y.o. female who presents to [facility] complaining of rhinorrhea, fever, myalgias, nasal congestion and cough for the last for the last two weeks. Her sx have gotten better and worse. She has abdominal pain, but not increased from normal. Her bowel movements and urination have been normal.   Past Medical History  Diagnosis Date  . Peptic ulcer disease     dr Oletta Lamas  . Acute duodenal ulcer with hemorrhage and perforation, with obstruction ([place])   . Barrett's esophagus   . Menopause   . GERD (gastroesophageal reflux disease)   . Asthma   . Chronic abdominal pain     narcotic dependence, dr gyarteng-dak at [facility]  . Narcotic abuse   . Anxiety     sees [name] at Dr. Radonna Ricker office  . Fx two ribs-open 08-24-11    left 4th and 5th  . Fx of fibula 07-28-11    left fibula, 2 places   . COPD (chronic obstructive pulmonary disease) ([place])   . Allergy   . Anemia   . History of blood transfusion     "related to OR; maybe when I perforated that ulcer"  . Lap Nissen + truncal vagotomy July 2008 05/13/2013  . LEG EDEMA 01/19/2008    Qualifier: Diagnosis of  By: [name] MD, [name]   . ACUTE DUODEN ULCER W/HEMORR PERF&OBSTRUCTION 06/26/2004    Qualifier: Diagnosis of  By: [name] MD, [name]   . FEVER, RECURRENT 08/11/2009    Qualifier: Diagnosis of  By: [name] MD, [name] MENOPAUSE 01/07/2007    Qualifier: Diagnosis of  By: [name],  CMA, [place]    . Gastroparesis 06/03/2007    Qualifier: Diagnosis of  By: [name] MD, [name]   . GASTRIC OUTLET OBSTRUCTION 08/28/2007    In July 2008 she underwent laparoscopic enterolysis, Nissen fundoplication over a [id number] bougie, single pledgeted suture colosure of the hiatus and a 3 suture wrap.  Lap truncal vagotomy and a loop gastrojejunostomy was performed.  This was revised in August of 2009 to a roux en Y gastrojejujostomy.     . 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 Depression   . History of hiatal hernia   . Complication of anesthesia     pt states had too much xanax on board - agitated   . Anginal pain ([place])     "many many years ago"  . Exertional shortness of breath   . Pneumonia, organism unspecified 11/07/2010    Assoc with R Parapneumonic effusion 09/2010    - Tapped 10/05/10    - CxR resolved 11/07/2010    . Headache     "monthly" (09/06/2014)  . Arthritis     "hips" (09/06/2014)  . Myocardial infarction ([place])   . S/P jejunostomy Feb 2016 07/26/2014   Allergies  Allergen Reactions  .  Lithium Nausea And Vomiting  . Celebrex [Celecoxib] Other (See Comments)    History of bleeding ulcers  . Morphine Itching  . Quetiapine Other (See Comments)     tardive dyskinesia  . Varenicline Tartrate Other (See Comments)    hallucinations   Current Outpatient Prescriptions on File Prior to Visit  Medication Sig Dispense Refill  . Aspirin-Acetaminophen-Caffeine (GOODY HEADACHE PO) Take 1 packet by mouth 4 (four) times daily as needed (pain). Reported on 06/23/2015    . Calcium Citrate-Vitamin D (CALCIUM CITRATE CHEWY BITE) 500-500 MG-UNIT CHEW Chew 1 tablet by mouth 2 (two) times daily. 180 tablet 3  . DULoxetine (CYMBALTA) 60 MG capsule Take 1 capsule (60 mg total) by mouth daily. 30 capsule 5  . feeding supplement (BOOST / RESOURCE BREEZE) LIQD Take 1 Container by mouth 3 (three) times daily between meals. 90 Container 11  . furosemide (LASIX) 20 MG tablet  Take 1 tablet (20 mg) by mouth with breakfast and/or lunch as needed for swelling 60 tablet 0  . Guaifenesin (MUCINEX MAXIMUM STRENGTH) 1200 MG TB12 Take 1 tablet (1,200 mg total) by mouth every 12 (twelve) hours as needed. 14 tablet 1  . ipratropium-albuterol (DUONEB) 0.5-2.5 (3) MG/3ML SOLN Take 3 mLs by nebulization 4 (four) times daily -  before meals and at bedtime. 360 mL 3  . OLANZapine (ZYPREXA) 10 MG tablet Take 1 tablet (10 mg total) by mouth at bedtime. 30 tablet 1  . ondansetron (ZOFRAN ODT) 4 MG disintegrating tablet Take 1-2 tablets (4-8 mg total) by mouth every 8 (eight) hours as needed for nausea or vomiting. 180 tablet 0  . pantoprazole (PROTONIX) 40 MG tablet Take 1 tablet (40 mg total) by mouth 2 (two) times daily before a meal. 180 tablet 3  . polyethylene glycol powder (GLYCOLAX/MIRALAX) powder Take 17 g by mouth 2 (two) times daily as needed. 850 g 11  . promethazine-phenylephrine (PROMETHAZINE-PHENYLEPHRINE) 6.25-5 MG/5ML SYRP Take 5 mLs by mouth every 4 (four) hours as needed for congestion. 280 mL 0  . ALPRAZolam (XANAX) 1 MG tablet Take 1 tablet (1 mg total) by mouth 3 (three) times daily as needed for anxiety. This is a 1 month supply (Patient not taking: Reported on 07/30/2015) 90 tablet 0  . HYDROcodone-acetaminophen (NORCO) 10-325 MG tablet Take 1-2 tablets by mouth every 6 (six) hours as needed for moderate pain. This is a 1 month supply. (Patient not taking: Reported on 07/30/2015) 240 tablet 0   No current facility-administered medications on file prior to visit.    Review of Systems  Constitutional: Positive for fever, activity change and fatigue. Negative for chills, appetite change and unexpected weight change.  HENT: Positive for congestion, postnasal drip and rhinorrhea. Negative for ear pain, sinus pressure, sore throat, trouble swallowing and voice change.   Respiratory: Positive for cough, chest tightness and shortness of breath. Negative for wheezing.     Cardiovascular: Negative for chest pain, palpitations and leg swelling.  Musculoskeletal: Positive for myalgias.  Neurological: Positive for weakness and headaches.  Psychiatric/Behavioral: Positive for dysphoric mood. Negative for sleep disturbance. The patient is nervous/anxious.        Objective:  BP 126/70 mmHg  Pulse 107  Temp(Src) 98.7 F (37.1 C) (Oral)  Resp 18  Ht 5\' 3"  (1.6 m)  Wt 95 lb (43.092 kg)  BMI 16.83 kg/m2  SpO2 91%  Physical Exam  Constitutional: She is oriented to person, place, and time. She appears well-developed and well-nourished. No distress.  HENT:  Head:  Normocephalic and atraumatic.  Bilateral TMs normal. Left greater than right clear nasal discharge and rhinitis.   Eyes: Pupils are equal, round, and reactive to light.  Neck: Neck supple.  Cardiovascular: Normal rate, regular rhythm and normal heart sounds.   No murmur heard. Pulmonary/Chest: Effort normal. No respiratory distress.  Lungs with good air movement, no wheezing.   Musculoskeletal: Normal range of motion.  Neurological: She is alert and oriented to person, place, and time. Coordination normal.  Skin: Skin is warm and dry. She is not diaphoretic.  Psychiatric: She has a normal mood and affect. Her behavior is normal.  Nursing note and vitals reviewed.     Assessment & Plan:   [place] controlled substances database reviewed, last oxycodone refill was on 07/07/2015. She still has a script for alprazolam prescribed by Dr. Joseph Art at [facility] which has four additional refills on it. She has lost the paper rx for next mo that I had given her at last visit and she knows to shred it if it is found.  Pt states her usu pharmacy is closed today.  Will call [facility] to have them cancel the rest of the alprazolam fills pt has on file as is switching her rxs to [facility].  1. Acute upper respiratory infection   2. Chronic pain syndrome   3. Adjustment disorder with mixed anxiety and depressed mood      Meds ordered this encounter  Medications  . albuterol (PROVENTIL) (2.5 MG/3ML) 0.083% nebulizer solution 2.5 mg    Sig:   . ipratropium (ATROVENT) nebulizer solution 0.5 mg    Sig:   . HYDROcodone-acetaminophen (NORCO) 10-325 MG tablet    Sig: Take 1-2 tablets by mouth every 6 (six) hours as needed for moderate pain. This is a 1 month supply.    Dispense:  240 tablet    Refill:  0    May fill August 04, 2015  . ALPRAZolam (XANAX) 1 MG tablet    Sig: Take 1 tablet (1 mg total) by mouth 3 (three) times daily as needed for anxiety. This is a 1 month supply    Dispense:  90 tablet    Refill:  0    May fill August 04, 2015  . ipratropium (ATROVENT) 0.02 % nebulizer solution    Sig: Take 2.5 mLs (0.5 mg total) by nebulization 4 (four) times daily.    Dispense:  75 mL    Refill:  0  . HYDROcodone-homatropine (HYCODAN) 5-1.5 MG/5ML syrup    Sig: Take 5 mLs by mouth every 8 (eight) hours as needed for cough.    Dispense:  120 mL    Refill:  0   Today I have utilized the [place] Controlled Substance Registry's online query to confirm compliance regarding the patient's narcotic pain medications. My review reveals appropriate prescription fills and that [facility] is the sole provider of these medications. Rechecks will occur regularly and the patient is aware of our use of the system.  I personally performed the services described in this documentation, which was scribed in my presence. The recorded information has been reviewed and considered, and addended by me as needed.  [name] Cheadle, MD MPH

## 2015-07-30 NOTE — Patient Instructions (Addendum)
You can combine an ipratropium nebulizer with an albuterol but you also have some that were prescribed to you that are already premixed (called a duoneb) - you can add an extra albuterol to the premixed one but do not an extra ipratropium to the premixed one.  Upper Respiratory Infection, Adult Most upper respiratory infections (URIs) are a viral infection of the air passages leading to the lungs. A URI affects the nose, throat, and upper air passages. The most common type of URI is nasopharyngitis and is typically referred to as "the common cold." URIs run their course and usually go away on their own. Most of the time, a URI does not require medical attention, but sometimes a bacterial infection in the upper airways can follow a viral infection. This is called a secondary infection. Sinus and middle ear infections are common types of secondary upper respiratory infections. Bacterial pneumonia can also complicate a URI. A URI can worsen asthma and chronic obstructive pulmonary disease (COPD). Sometimes, these complications can require emergency medical care and may be life threatening.  CAUSES Almost all URIs are caused by viruses. A virus is a type of germ and can spread from one person to another.  RISKS FACTORS You may be at risk for a URI if:   You smoke.   You have chronic heart or lung disease.  You have a weakened defense (immune) system.   You are very young or very old.   You have nasal allergies or asthma.  You work in crowded or poorly ventilated areas.  You work in health care facilities or schools. SIGNS AND SYMPTOMS  Symptoms typically develop 2-3 days after you come in contact with a cold virus. Most viral URIs last 7-10 days. However, viral URIs from the influenza virus (flu virus) can last 14-18 days and are typically more severe. Symptoms may include:   Runny or stuffy (congested) nose.   Sneezing.   Cough.   Sore throat.   Headache.   Fatigue.    Fever.   Loss of appetite.   Pain in your forehead, behind your eyes, and over your cheekbones (sinus pain).  Muscle aches.  DIAGNOSIS  Your health care provider may diagnose a URI by:  Physical exam.  Tests to check that your symptoms are not due to another condition such as:  Strep throat.  Sinusitis.  Pneumonia.  Asthma. TREATMENT  A URI goes away on its own with time. It cannot be cured with medicines, but medicines may be prescribed or recommended to relieve symptoms. Medicines may help:  Reduce your fever.  Reduce your cough.  Relieve nasal congestion. HOME CARE INSTRUCTIONS   Take medicines only as directed by your health care provider.   Gargle warm saltwater or take cough drops to comfort your throat as directed by your health care provider.  Use a warm mist humidifier or inhale steam from a shower to increase air moisture. This may make it easier to breathe.  Drink enough fluid to keep your urine clear or pale yellow.   Eat soups and other clear broths and maintain good nutrition.   Rest as needed.   Return to work when your temperature has returned to normal or as your health care provider advises. You may need to stay home longer to avoid infecting others. You can also use a face mask and careful hand washing to prevent spread of the virus.  Increase the usage of your inhaler if you have asthma.   Do not use  any tobacco products, including cigarettes, chewing tobacco, or electronic cigarettes. If you need help quitting, ask your health care provider. PREVENTION  The best way to protect yourself from getting a cold is to practice good hygiene.   Avoid oral or hand contact with people with cold symptoms.   Wash your hands often if contact occurs.  There is no clear evidence that vitamin C, vitamin E, echinacea, or exercise reduces the chance of developing a cold. However, it is always recommended to get plenty of rest, exercise, and  practice good nutrition.  SEEK MEDICAL CARE IF:   You are getting worse rather than better.   Your symptoms are not controlled by medicine.   You have chills.  You have worsening shortness of breath.  You have brown or red mucus.  You have yellow or brown nasal discharge.  You have pain in your face, especially when you bend forward.  You have a fever.  You have swollen neck glands.  You have pain while swallowing.  You have white areas in the back of your throat. SEEK IMMEDIATE MEDICAL CARE IF:   You have severe or persistent:  Headache.  Ear pain.  Sinus pain.  Chest pain.  You have chronic lung disease and any of the following:  Wheezing.  Prolonged cough.  Coughing up blood.  A change in your usual mucus.  You have a stiff neck.  You have changes in your:  Vision.  Hearing.  Thinking.  Mood. MAKE SURE YOU:   Understand these instructions.  Will watch your condition.  Will get help right away if you are not doing well or get worse.   This information is not intended to replace advice given to you by your health care provider. Make sure you discuss any questions you have with your health care provider.   Document Released: 12/04/2000 Document Revised: 10/25/2014 Document Reviewed: 09/15/2013 Elsevier Interactive Patient Education [employer].

## 2015-07-31 ENCOUNTER — Other Ambulatory Visit: Payer: Self-pay | Admitting: Family Medicine

## 2015-08-01 ENCOUNTER — Telehealth: Payer: Self-pay

## 2015-08-01 DIAGNOSIS — J449 Chronic obstructive pulmonary disease, unspecified: Secondary | ICD-10-CM | POA: Diagnosis not present

## 2015-08-01 MED ORDER — ALBUTEROL SULFATE HFA 108 (90 BASE) MCG/ACT IN AERS: 2.0000 | INHALATION_SPRAY | RESPIRATORY_TRACT | Status: DC | PRN

## 2015-08-01 NOTE — Telephone Encounter (Addendum)
Pt states she was given some medicine for her asthma but when she got home, her machine doesn't work and really need to have another machine called into the medicine supply Please call [phone]

## 2015-08-01 NOTE — Telephone Encounter (Signed)
Spoke to Dr Brigitte Pulse, who verbally OKd sending in inhaler Rx and signed order for the nebulizer. Faxed neb order to [facility] w/OV notes, demo sheet and Ins card and received confirmation it went through. Notified pt.

## 2015-08-01 NOTE — Telephone Encounter (Signed)
Pt called and I spoke with her because she was reporting that she really needs to get her neb machine because she can't breathe. Operator had already told her to RTC if she can't breathe. I also advised pt that if she is really in trouble having trouble breathing that she needs to either come back here or go to ED immediately for a treatment. Pt stated that she can't, she's too sick. Pt was not so SOB that she was having trouble breathing and I did not hear any obvious wheezing on the phone. I advised pt that we will get another nebulizer ordered but asked if she has a rescue inhaler in the meantime. Pt does not. We have not Rxd in [facility], so I can not RF. I have pended a Rx for albuterol inhaler, and will prepare order for nebulizer. Dr Brigitte Pulse, please advise.

## 2015-08-01 NOTE — Telephone Encounter (Signed)
Perfect barbara. Thank you so much

## 2015-08-02 ENCOUNTER — Other Ambulatory Visit: Payer: Self-pay | Admitting: Family Medicine

## 2015-08-07 ENCOUNTER — Ambulatory Visit (INDEPENDENT_AMBULATORY_CARE_PROVIDER_SITE_OTHER): Payer: Medicare Other | Admitting: Family Medicine

## 2015-08-07 ENCOUNTER — Ambulatory Visit (INDEPENDENT_AMBULATORY_CARE_PROVIDER_SITE_OTHER): Payer: Medicare Other

## 2015-08-07 VITALS — BP 140/68 | HR 82 | Temp 99.6°F | Resp 19 | Ht 63.0 in | Wt 104.0 lb

## 2015-08-07 DIAGNOSIS — R509 Fever, unspecified: Secondary | ICD-10-CM

## 2015-08-07 DIAGNOSIS — R0902 Hypoxemia: Secondary | ICD-10-CM | POA: Diagnosis not present

## 2015-08-07 DIAGNOSIS — Z720 Tobacco use: Secondary | ICD-10-CM

## 2015-08-07 DIAGNOSIS — J440 Chronic obstructive pulmonary disease with acute lower respiratory infection: Secondary | ICD-10-CM

## 2015-08-07 DIAGNOSIS — R636 Underweight: Secondary | ICD-10-CM

## 2015-08-07 DIAGNOSIS — R0602 Shortness of breath: Secondary | ICD-10-CM | POA: Diagnosis not present

## 2015-08-07 LAB — COMPREHENSIVE METABOLIC PANEL
ALT: 15 U/L (ref 6–29)
AST: 20 U/L (ref 10–35)
Albumin: 3.8 g/dL (ref 3.6–5.1)
Alkaline Phosphatase: 90 U/L (ref 33–130)
BUN: 10 mg/dL (ref 7–25)
CALCIUM: 8.7 mg/dL (ref 8.6–10.4)
CHLORIDE: 104 mmol/L (ref 98–110)
CO2: 26 mmol/L (ref 20–31)
Creat: 0.73 mg/dL (ref 0.50–0.99)
GLUCOSE: 61 mg/dL — AB (ref 65–99)
POTASSIUM: 3.6 mmol/L (ref 3.5–5.3)
Sodium: 140 mmol/L (ref 135–146)
Total Bilirubin: 0.4 mg/dL (ref 0.2–1.2)
Total Protein: 6.2 g/dL (ref 6.1–8.1)

## 2015-08-07 LAB — POCT INFLUENZA A/B
INFLUENZA A, POC: NEGATIVE
INFLUENZA B, POC: NEGATIVE

## 2015-08-07 LAB — POCT CBC
GRANULOCYTE PERCENT: 73 % (ref 37–80)
HEMATOCRIT: 37.1 % — AB (ref 37.7–47.9)
HEMOGLOBIN: 12.3 g/dL (ref 12.2–16.2)
LYMPH, POC: 1.6 (ref 0.6–3.4)
MCH, POC: 30 pg (ref 27–31.2)
MCHC: 33.1 g/dL (ref 31.8–35.4)
MCV: 90.6 fL (ref 80–97)
MID (cbc): 0.2 (ref 0–0.9)
MPV: 7.8 fL (ref 0–99.8)
PLATELET COUNT, POC: 273 10*3/uL (ref 142–424)
POC GRANULOCYTE: 5 (ref 2–6.9)
POC LYMPH %: 23.6 % (ref 10–50)
POC MID %: 3.4 %M (ref 0–12)
RBC: 4.09 M/uL (ref 4.04–5.48)
RDW, POC: 19.4 %
WBC: 6.9 10*3/uL (ref 4.6–10.2)

## 2015-08-07 MED ORDER — PREDNISONE 20 MG PO TABS: 40.0000 mg | ORAL_TABLET | Freq: Every day | ORAL | Status: DC

## 2015-08-07 MED ORDER — ALBUTEROL SULFATE (2.5 MG/3ML) 0.083% IN NEBU
2.5000 mg | INHALATION_SOLUTION | Freq: Once | RESPIRATORY_TRACT | Status: AC
  Administered 2015-08-07: 2.5 mg via RESPIRATORY_TRACT

## 2015-08-07 MED ORDER — AZITHROMYCIN 250 MG PO TABS: ORAL_TABLET | ORAL | Status: DC

## 2015-08-07 MED ORDER — IPRATROPIUM BROMIDE 0.02 % IN SOLN
0.5000 mg | Freq: Once | RESPIRATORY_TRACT | Status: AC
  Administered 2015-08-07: 0.5 mg via RESPIRATORY_TRACT

## 2015-08-07 MED ORDER — METHYLPREDNISOLONE SODIUM SUCC 125 MG IJ SOLR
125.0000 mg | Freq: Once | INTRAMUSCULAR | Status: AC
  Administered 2015-08-07: 125 mg via INTRAMUSCULAR

## 2015-08-07 NOTE — Progress Notes (Signed)
Subjective:  By signing my name below, I, [name], attest that this documentation has been prepared under the direction and in the presence of [name], MD Electronically Signed: Ladene Artist, ED Scribe 08/07/2015 at 9:47 AM.   Patient ID: [name], female    DOB: [birth year], 65 y.o.   MRN: [id number]  Chief Complaint  Patient presents with  . URI    poss. flu   HPI HPI Comments: [name] is a 65 y.o. female, with a h/o COPD and asthma, who presents to the [facility] complaining of gradually worsened generalized myalgias for the past few days. Pt's symptoms started on 07/16/15; this is her third visit for the same. On 07/20/15 her flu swab was negative. No other labs or imaging as exam has been benign. Today, se reports associated fever with Tmax 99 F, chills onset last night and warmth to both eyes. Triage temperature 99.6 F. She denies cough. Pt last used her nebulizer last night. She has tried Tylenol and Aspirin without significant relief. Pt received her flu vaccine in September.   Smoking Pt also discusses her desire to try Chantix for smoking cessation. She states that she has tried Chantix in the past with temporary success.   Medications Pt has 11 pills of high dose Vitamin D left. She also takes celexa 60 with 5 refills, olanzapine 10, omeprazole and pantoprazole x2 daily. Iron multivitamin iron supplement 325. Discontinued Remeron. Sending pt home with omeprazole 40 mg and pantoprazole 40 mg.   Past Medical History  Diagnosis Date  . Peptic ulcer disease     dr Oletta Lamas  . Acute duodenal ulcer with hemorrhage and perforation, with obstruction ([place])   . Barrett's esophagus   . Menopause   . GERD (gastroesophageal reflux disease)   . Asthma   . Chronic abdominal pain     narcotic dependence, dr gyarteng-dak at [facility]  . Narcotic abuse   . Anxiety     sees [name] NP at Dr. Radonna Ricker office  . Fx two ribs-open 08-24-11      left 4th and 5th  . Fx of fibula 07-28-11    left fibula, 2 places   . COPD (chronic obstructive pulmonary disease) ([place])   . Allergy   . Anemia   . History of blood transfusion     "related to OR; maybe when I perforated that ulcer"  . Lap Nissen + truncal vagotomy July 2008 05/13/2013  . LEG EDEMA 01/19/2008    Qualifier: Diagnosis of  By: [name] MD, [name]   . ACUTE DUODEN ULCER W/HEMORR PERF&OBSTRUCTION 06/26/2004    Qualifier: Diagnosis of  By: [name] MD, [name]   . FEVER, RECURRENT 08/11/2009    Qualifier: Diagnosis of  By: [name] MD, [name] MENOPAUSE 01/07/2007    Qualifier: Diagnosis of  By: [name], CMA, [place]    . Gastroparesis 06/03/2007    Qualifier: Diagnosis of  By: [name] MD, [name]   . GASTRIC OUTLET OBSTRUCTION 08/28/2007    In July 2008 she underwent laparoscopic enterolysis, Nissen fundoplication over a [id number] bougie, single pledgeted suture colosure of the hiatus and a 3 suture wrap.  Lap truncal vagotomy and a loop gastrojejunostomy was performed.  This was revised in August of 2009 to a roux en Y gastrojejujostomy.     . 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 Depression   . History of hiatal hernia   .  Complication of anesthesia     pt states had too much xanax on board - agitated   . Anginal pain ([place])     "many many years ago"  . Exertional shortness of breath   . Pneumonia, organism unspecified 11/07/2010    Assoc with R Parapneumonic effusion 09/2010    - Tapped 10/05/10    - CxR resolved 11/07/2010    . Headache     "monthly" (09/06/2014)  . Arthritis     "hips" (09/06/2014)  . Myocardial infarction ([place])   . S/P jejunostomy Feb 2016 07/26/2014   Current Outpatient Prescriptions on File Prior to Visit  Medication Sig Dispense Refill  . albuterol (PROVENTIL HFA;VENTOLIN HFA) 108 (90 Base) MCG/ACT inhaler Inhale 2 puffs into the lungs every 4 (four) hours as needed (cough, shortness of breath or wheezing.). 1 Inhaler 3  . ALPRAZolam  (XANAX) 1 MG tablet Take 1 tablet (1 mg total) by mouth 3 (three) times daily as needed for anxiety. This is a 1 month supply 90 tablet 0  . Aspirin-Acetaminophen-Caffeine (GOODY HEADACHE PO) Take 1 packet by mouth 4 (four) times daily as needed (pain). Reported on 06/23/2015    . Calcium Citrate-Vitamin D (CALCIUM CITRATE CHEWY BITE) 500-500 MG-UNIT CHEW Chew 1 tablet by mouth 2 (two) times daily. 180 tablet 3  . DULoxetine (CYMBALTA) 60 MG capsule Take 1 capsule (60 mg total) by mouth daily. 30 capsule 5  . feeding supplement (BOOST / RESOURCE BREEZE) LIQD Take 1 Container by mouth 3 (three) times daily between meals. 90 Container 11  . furosemide (LASIX) 20 MG tablet Take 1 tablet (20 mg) by mouth with breakfast and/or lunch as needed for swelling 60 tablet 0  . Guaifenesin (MUCINEX MAXIMUM STRENGTH) 1200 MG TB12 Take 1 tablet (1,200 mg total) by mouth every 12 (twelve) hours as needed. 14 tablet 1  . HYDROcodone-acetaminophen (NORCO) 10-325 MG tablet Take 1-2 tablets by mouth every 6 (six) hours as needed for moderate pain. This is a 1 month supply. 240 tablet 0  . HYDROcodone-homatropine (HYCODAN) 5-1.5 MG/5ML syrup Take 5 mLs by mouth every 8 (eight) hours as needed for cough. 120 mL 0  . ipratropium (ATROVENT) 0.02 % nebulizer solution Take 2.5 mLs (0.5 mg total) by nebulization 4 (four) times daily. 75 mL 0  . ipratropium-albuterol (DUONEB) 0.5-2.5 (3) MG/3ML SOLN Take 3 mLs by nebulization 4 (four) times daily -  before meals and at bedtime. 360 mL 3  . OLANZapine (ZYPREXA) 10 MG tablet Take 1 tablet (10 mg total) by mouth at bedtime. 30 tablet 1  . ondansetron (ZOFRAN ODT) 4 MG disintegrating tablet Take 1-2 tablets (4-8 mg total) by mouth every 8 (eight) hours as needed for nausea or vomiting. 180 tablet 0  . pantoprazole (PROTONIX) 40 MG tablet Take 1 tablet (40 mg total) by mouth 2 (two) times daily before a meal. 180 tablet 3  . polyethylene glycol powder (GLYCOLAX/MIRALAX) powder Take 17  g by mouth 2 (two) times daily as needed. 850 g 11  . promethazine-phenylephrine (PROMETHAZINE-PHENYLEPHRINE) 6.25-5 MG/5ML SYRP Take 5 mLs by mouth every 4 (four) hours as needed for congestion. 280 mL 0   No current facility-administered medications on file prior to visit.   Allergies  Allergen Reactions  . Lithium Nausea And Vomiting  . Celebrex [Celecoxib] Other (See Comments)    History of bleeding ulcers  . Morphine Itching  . Quetiapine Other (See Comments)     tardive dyskinesia  . Varenicline Tartrate Other (  See Comments)    hallucinations   Review of Systems  Constitutional: Positive for fever, chills, diaphoresis, activity change and fatigue. Negative for appetite change and unexpected weight change.  HENT: Positive for postnasal drip, sinus pressure and sore throat. Negative for congestion, nosebleeds, rhinorrhea, sneezing and trouble swallowing.   Respiratory: Positive for chest tightness and shortness of breath. Negative for cough and wheezing.   Cardiovascular: Negative for leg swelling.  Musculoskeletal: Positive for myalgias, back pain, arthralgias and gait problem. Negative for joint swelling.  Neurological: Positive for weakness and headaches.  Hematological: Positive for adenopathy. Bruises/bleeds easily.  Psychiatric/Behavioral: Positive for sleep disturbance.   BP 140/68 mmHg  Pulse 82  Temp(Src) 99.6 F (37.6 C) (Oral)  Resp 19  Ht 5\' 3"  (1.6 m)  Wt 104 lb (47.174 kg)  BMI 18.43 kg/m2. O2 improved to 92-93% at rest    Objective:   Physical Exam  Constitutional: She is oriented to person, place, and time. She appears well-developed and well-nourished. No distress.  HENT:  Head: Normocephalic and atraumatic.  L TM: normal. R TM: cerumen. Nasal congestion.  Eyes: Conjunctivae and EOM are normal.  Neck: Neck supple. No tracheal deviation present.  Cardiovascular: Normal rate.   Murmur heard.  Systolic murmur is present with a grade of 2/6  2/6 systolic  injection murmur L upper sternum border  Pulmonary/Chest: Effort normal. No respiratory distress. She has rales.  Good air movement. A few faint scattered inspiratory rales throughout.   Musculoskeletal: Normal range of motion.  Lymphadenopathy:    She has cervical adenopathy (shotty).  Neurological: She is alert and oriented to person, place, and time.  Skin: Skin is warm and dry.  Psychiatric: She has a normal mood and affect. Her behavior is normal.  Nursing note and vitals reviewed.      Results for orders placed or performed in visit on 08/07/15  Comprehensive metabolic panel  Result Value Ref Range   Sodium 140 135 - 146 mmol/L   Potassium 3.6 3.5 - 5.3 mmol/L   Chloride 104 98 - 110 mmol/L   CO2 26 20 - 31 mmol/L   Glucose, Bld 61 (L) 65 - 99 mg/dL   BUN 10 7 - 25 mg/dL   Creat 0.73 0.50 - 0.99 mg/dL   Total Bilirubin 0.4 0.2 - 1.2 mg/dL   Alkaline Phosphatase 90 33 - 130 U/L   AST 20 10 - 35 U/L   ALT 15 6 - 29 U/L   Total Protein 6.2 6.1 - 8.1 g/dL   Albumin 3.8 3.6 - 5.1 g/dL   Calcium 8.7 8.6 - 10.4 mg/dL  POCT CBC  Result Value Ref Range   WBC 6.9 4.6 - 10.2 K/uL   Lymph, poc 1.6 0.6 - 3.4   POC LYMPH PERCENT 23.6 10 - 50 %L   MID (cbc) 0.2 0 - 0.9   POC MID % 3.4 0 - 12 %M   POC Granulocyte 5.0 2 - 6.9   Granulocyte percent 73.0 37 - 80 %G   RBC 4.09 4.04 - 5.48 M/uL   Hemoglobin 12.3 12.2 - 16.2 g/dL   HCT, POC 37.1 (A) 37.7 - 47.9 %   MCV 90.6 80 - 97 fL   MCH, POC 30.0 27 - 31.2 pg   MCHC 33.1 31.8 - 35.4 g/dL   RDW, POC 19.4 %   Platelet Count, POC 273 142 - 424 K/uL   MPV 7.8 0 - 99.8 fL  POCT Influenza A/B  Result Value  Ref Range   Influenza A, POC Negative Negative   Influenza B, POC Negative Negative   Dg Chest 2 View  08/07/2015  CLINICAL DATA:  Shortness of breath, heavy smoker EXAM: CHEST  2 VIEW COMPARISON:  04/09/2015 FINDINGS: Mild hyperinflation of the lungs. Heart and mediastinal contours are within normal limits. No focal opacities or  effusions. No acute bony abnormality. IMPRESSION: Hyperinflation.  No active cardiopulmonary disease. Electronically Signed   By: Rolm Baptise M.D.   On: 08/07/2015 09:36    Assessment & Plan:    1. Chronic obstructive pulmonary disease with acute lower respiratory infection ([place])   2. Tobacco abuse - pt interested in re-trying chantix despite disturbing dreams when on x 1 wk prior as it was effective  - she was on [place] as well at that time so could have been interaction - discuss at f/u - would rec starting when pt is at a good place both physically and mentally.  Discuss screening lung CT scan at f/u.  3. Hypoxia - ranged from 90-95% sitting in office on RA, drops to 86% with ambulation, req order sent to [place] for home O2 sat as pt may need home oxygen for her use during flairs. Cons starting trial of ICS inhaler and pulm referral if low sats persist - fortunately for now pt is having rare exac and denies any SHOB, wheeze, or DOE at baseline with minimal chronic cough.    4. Fever, unspecified   5. Underweight - pt has gained 9 lbs in the past week according to our scales!!!!    Orders Placed This Encounter  Procedures  . For home use only DME Pulse oximeter    Please fax to [facility] where pt is getting her neb machine.  . DG Chest 2 View    Standing Status: Future     Number of Occurrences: 1     Standing Expiration Date: 08/06/2016    Order Specific Question:  Reason for Exam (SYMPTOM  OR DIAGNOSIS REQUIRED)    Answer:  hypoxemia, worsening fevers and SHoB after 3 wk illness, heavy smoker    Order Specific Question:  Preferred imaging location?    Answer:  External  . Comprehensive metabolic panel  . POCT CBC  . POCT Influenza A/B    Meds ordered this encounter  Medications  . DISCONTD: omeprazole (PRILOSEC) 40 MG capsule    Sig: Take 40 mg by mouth daily.  Marland Kitchen albuterol (PROVENTIL) (2.5 MG/3ML) 0.083% nebulizer solution 2.5 mg    Sig:   . ipratropium (ATROVENT) nebulizer  solution 0.5 mg    Sig:   . predniSONE (DELTASONE) 20 MG tablet    Sig: Take 2 tablets (40 mg total) by mouth daily with breakfast.    Dispense:  10 tablet    Refill:  0  . azithromycin (ZITHROMAX) 250 MG tablet    Sig: Take 2 tabs PO x 1 dose, then 1 tab PO QD x 4 days    Dispense:  6 tablet    Refill:  0  . methylPREDNISolone sodium succinate (SOLU-MEDROL) 125 mg/2 mL injection 125 mg    Sig:    Over 40 min spent in face-to-face evaluation of and consultation with patient and coordination of care.  Over 50% of this time was spent counseling this patient.   I personally performed the services described in this documentation, which was scribed in my presence. The recorded information has been reviewed and considered, and addended by me as needed.  Harmon Pier  Brigitte Pulse, MD MPH

## 2015-08-07 NOTE — Patient Instructions (Signed)

## 2015-08-12 ENCOUNTER — Ambulatory Visit (INDEPENDENT_AMBULATORY_CARE_PROVIDER_SITE_OTHER): Payer: Medicare Other | Admitting: Family Medicine

## 2015-08-12 VITALS — BP 128/64 | HR 78 | Temp 99.1°F | Resp 16 | Ht 64.0 in | Wt 103.0 lb

## 2015-08-12 DIAGNOSIS — J441 Chronic obstructive pulmonary disease with (acute) exacerbation: Secondary | ICD-10-CM

## 2015-08-12 MED ORDER — METHYLPREDNISOLONE 4 MG PO TABS: 4.0000 mg | ORAL_TABLET | Freq: Every day | ORAL | Status: DC

## 2015-08-12 MED ORDER — HYDROCOD POLST-CPM POLST ER 10-8 MG/5ML PO SUER: 5.0000 mL | Freq: Two times a day (BID) | ORAL | Status: DC | PRN

## 2015-08-12 MED ORDER — LEVOFLOXACIN 500 MG PO TABS: 500.0000 mg | ORAL_TABLET | Freq: Every day | ORAL | Status: DC

## 2015-08-12 NOTE — Progress Notes (Signed)
@[email or web address] @  By signing my name below, I, [name], attest that this documentation has been prepared under the direction and in the presence of [name] Haber, MD.  Electronically Signed: Thea [name], ED Scribe. 08/12/2015. 8:37 AM.  Patient ID: [name] MRN: [id number], DOB: 12-17-1950, 65 y.o. Date of Encounter: 08/12/2015, 8:37 AM  Primary Physician: [name], MD  Chief Complaint:  Chief Complaint  Patient presents with  . Fever    x 3 weeks  . Generalized Body Aches    HPI: 65 y.o. year old female with hx of asthma history below presents with fever and body aches. Pt was seen by Dr. Brigitte [name] 1 week ago for Lower respiratory infection. She was prescribed Zpack which she finished 1 day ago. Pt states cough has somewhat improved but feels "flu-ish". She reports having a persistent fever for the past couple weeks and body aches. She has not been able to sleep due to not feeling well.  She has been taking cough syrup and using her inhaler 3-4 times a day with some relief. Her caregiver states she has trying to get pt to drink water instead of soda. Pt is still smoking.   Past Medical History  Diagnosis Date  . Peptic ulcer disease     dr [name]  . Acute duodenal ulcer with hemorrhage and perforation, with obstruction ([place])   . Barrett's esophagus   . Menopause   . GERD (gastroesophageal reflux disease)   . Asthma   . Chronic abdominal pain     narcotic dependence, dr gyarteng-dak [facility]  . Narcotic abuse   . Anxiety     sees [name] Ripple NP at Dr. Radonna Ricker office  . Fx two ribs-open 08-24-11    left 4th and 5th  . Fx of fibula 07-28-11    left fibula, 2 places   . COPD (chronic obstructive pulmonary disease) ([place])   . Allergy   . Anemia   . History of blood transfusion     "related to OR; maybe when I perforated that ulcer"  . Lap Nissen + truncal vagotomy July 2008 05/13/2013  . LEG EDEMA 01/19/2008    Qualifier: Diagnosis of  By: [name] Jews MD, [name]   .  ACUTE DUODEN ULCER W/HEMORR PERF&OBSTRUCTION 06/26/2004    Qualifier: Diagnosis of  By: [name] Perches MD, [name]   . FEVER, RECURRENT 08/11/2009    Qualifier: Diagnosis of  By: [name] Jews MD, [name] MENOPAUSE 01/07/2007    Qualifier: Diagnosis of  By: [name], CMA, [place]    . Gastroparesis 06/03/2007    Qualifier: Diagnosis of  By: [name] Jews MD, [name]   . GASTRIC OUTLET OBSTRUCTION 08/28/2007    In July 2008 she underwent laparoscopic enterolysis, Nissen fundoplication over a [id number] bougie, single pledgeted suture colosure of the hiatus and a 3 suture wrap.  Lap truncal vagotomy and a loop gastrojejunostomy was performed.  This was revised in August of 2009 to a roux en Y gastrojejujostomy.     . 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 Depression   . History of hiatal hernia   . Complication of anesthesia     pt states had too much xanax on board - agitated   . Anginal pain ([place])     "many many years ago"  . Exertional shortness of breath   . Pneumonia, organism unspecified 11/07/2010    Assoc with R Parapneumonic effusion 09/2010    - Tapped 10/05/10    -  CxR resolved 11/07/2010    . Headache     "monthly" (09/06/2014)  . Arthritis     "hips" (09/06/2014)  . Myocardial infarction ([place])   . S/P jejunostomy Feb 2016 07/26/2014     Home Meds: Prior to Admission medications   Medication Sig Start Date End Date Taking? Authorizing Provider  albuterol (PROVENTIL HFA;VENTOLIN HFA) 108 (90 Base) MCG/ACT inhaler Inhale 2 puffs into the lungs every 4 (four) hours as needed (cough, shortness of breath or wheezing.). 08/01/15  Yes Shawnee Knapp, MD  ALPRAZolam [name]) 1 MG tablet Take 1 tablet (1 mg total) by mouth 3 (three) times daily as needed for anxiety. This is a 1 month supply 07/30/15  Yes Shawnee Knapp, MD  Aspirin-Acetaminophen-Caffeine (GOODY HEADACHE PO) Take 1 packet by mouth 4 (four) times daily as needed (pain). Reported on 06/23/2015   Yes Historical Provider, MD  azithromycin  (ZITHROMAX) 250 MG tablet Take 2 tabs PO x 1 dose, then 1 tab PO QD x 4 days 08/07/15  Yes Shawnee Knapp, MD  Calcium Citrate-Vitamin D (CALCIUM CITRATE CHEWY BITE) 500-500 MG-UNIT CHEW Chew 1 tablet by mouth 2 (two) times daily. 04/17/15  Yes Shawnee Knapp, MD  DULoxetine (CYMBALTA) 60 MG capsule Take 1 capsule (60 mg total) by mouth daily. 06/23/15  Yes Shawnee Knapp, MD  feeding supplement (BOOST / RESOURCE BREEZE) LIQD Take 1 Container by mouth 3 (three) times daily between meals. 07/20/15  Yes Shawnee Knapp, MD  furosemide (LASIX) 20 MG tablet Take 1 tablet (20 mg) by mouth with breakfast and/or lunch as needed for swelling 04/12/15  Yes Shawnee Knapp, MD  Guaifenesin [facility] MAXIMUM STRENGTH) 1200 MG TB12 Take 1 tablet (1,200 mg total) by mouth every 12 (twelve) hours as needed. 07/20/15  Yes Shawnee Knapp, MD  HYDROcodone-acetaminophen (NORCO) 10-325 MG tablet Take 1-2 tablets by mouth every 6 (six) hours as needed for moderate pain. This is a 1 month supply. 07/30/15  Yes Shawnee Knapp, MD  HYDROcodone-homatropine [facility]) 5-1.5 MG/5ML syrup Take 5 mLs by mouth every 8 (eight) hours as needed for cough. 07/30/15  Yes Shawnee Knapp, MD  ipratropium (ATROVENT) 0.02 % nebulizer solution Take 2.5 mLs (0.5 mg total) by nebulization 4 (four) times daily. 07/30/15  Yes Shawnee Knapp, MD  ipratropium-albuterol (DUONEB) 0.5-2.5 (3) MG/3ML SOLN Take 3 mLs by nebulization 4 (four) times daily -  before meals and at bedtime. 06/03/15  Yes [name] Haber, MD  OLANZapine (ZYPREXA) 10 MG tablet Take 1 tablet (10 mg total) by mouth at bedtime. 06/23/15  Yes Shawnee Knapp, MD  ondansetron (ZOFRAN ODT) 4 MG disintegrating tablet Take 1-2 tablets (4-8 mg total) by mouth every 8 (eight) hours as needed for nausea or vomiting. 07/20/15  Yes Shawnee Knapp, MD  pantoprazole (PROTONIX) 40 MG tablet Take 1 tablet (40 mg total) by mouth 2 (two) times daily before a meal. 06/03/15  Yes [name] Haber, MD  polyethylene glycol powder (GLYCOLAX/MIRALAX) powder  Take 17 g by mouth 2 (two) times daily as needed. 04/12/15  Yes Shawnee Knapp, MD  predniSONE (DELTASONE) 20 MG tablet Take 2 tablets (40 mg total) by mouth daily with breakfast. 08/07/15  Yes Shawnee Knapp, MD  promethazine-phenylephrine (PROMETHAZINE-PHENYLEPHRINE) 6.25-5 MG/5ML SYRP Take 5 mLs by mouth every 4 (four) hours as needed for congestion. 07/20/15  Yes Shawnee Knapp, MD    Allergies:  Allergies  Allergen Reactions  . Lithium Nausea And  Vomiting  . Celebrex [Celecoxib] Other (See Comments)    History of bleeding ulcers  . Morphine Itching  . Quetiapine Other (See Comments)     tardive dyskinesia  . Varenicline Tartrate Other (See Comments)    hallucinations    Social History   Social History  . Marital Status: Divorced    Spouse Name: N/A  . Number of Children: N/A  . Years of Education: N/A   Occupational History  . Not on file.   Social History Main Topics  . Smoking status: Current Every Day Smoker -- 2.00 packs/day for 50 years    Types: Cigarettes  . Smokeless tobacco: Never Used  . Alcohol Use: No  . Drug Use: No  . Sexual Activity: No   Other Topics Concern  . Not on file   Social History Narrative     Review of Systems: Constitutional: negative for  night sweats, weight changes, or fatigue  HEENT: negative for vision changes, hearing loss, congestion, rhinorrhea, ST, epistaxis, or sinus pressure Cardiovascular: negative for chest pain or palpitations Respiratory: negative for hemoptysis, wheezing, shortness of breath. Abdominal: negative for abdominal pain, nausea, vomiting, diarrhea, or constipation Dermatological: negative for rash Neurologic: negative for headache, dizziness, or syncope All other systems reviewed and are otherwise negative with the exception to those above and in the HPI.   Physical Exam: Blood pressure 128/64, [name] 78, temperature 99.1 F (37.3 C), resp. rate 16, height 5\' 4"  (1.626 m), weight 103 lb (46.72 kg), SpO2 95 %., Body  mass index is 17.67 kg/(m^2). General: Well developed, well nourished, in no acute distress. Body habitus is muscle wasting.  Head: Normocephalic, atraumatic, eyes without discharge, sclera non-icteric, nares are without discharge. Bilateral auditory canals clear, TM's are without perforation, pearly grey and translucent with reflective cone of light bilaterally. Oral cavity moist, posterior pharynx without exudate, erythema, peritonsillar abscess, or post nasal drip.  Neck: Supple. No thyromegaly. Full ROM. No lymphadenopathy. Lungs: Clear bilaterally to auscultation with Bilateral expiratory wheezing but no  rales, or rhonchi. Breathing is unlabored. Marland Kitchen   Heart: RRR with S1 S2. No murmurs, rubs, or gallops appreciated. Msk:  Strength and tone normal for age. Extremities/Skin: Warm and dry. No clubbing or cyanosis. No edema. No rashes or suspicious lesions. Neuro: Alert and oriented X 3. Moves all extremities spontaneously. Gait is normal. CNII-XII grossly in tact. Psych:  Responds to questions appropriately with a normal affect.   Results for orders placed or performed in visit on 08/07/15  Comprehensive metabolic panel  Result Value Ref Range   Sodium 140 135 - 146 mmol/L   Potassium 3.6 3.5 - 5.3 mmol/L   Chloride 104 98 - 110 mmol/L   CO2 26 20 - 31 mmol/L   Glucose, Bld 61 (L) 65 - 99 mg/dL   BUN 10 7 - 25 mg/dL   Creat 0.73 0.50 - 0.99 mg/dL   Total Bilirubin 0.4 0.2 - 1.2 mg/dL   Alkaline Phosphatase 90 33 - 130 U/L   AST 20 10 - 35 U/L   ALT 15 6 - 29 U/L   Total Protein 6.2 6.1 - 8.1 g/dL   Albumin 3.8 3.6 - 5.1 g/dL   Calcium 8.7 8.6 - 10.4 mg/dL  POCT CBC  Result Value Ref Range   WBC 6.9 4.6 - 10.2 K/uL   Lymph, poc 1.6 0.6 - 3.4   POC LYMPH PERCENT 23.6 10 - 50 %L   MID (cbc) 0.2 0 - 0.9   POC  MID % 3.4 0 - 12 %M   POC Granulocyte 5.0 2 - 6.9   Granulocyte percent 73.0 37 - 80 %G   RBC 4.09 4.04 - 5.48 M/uL   Hemoglobin 12.3 12.2 - 16.2 g/dL   HCT, POC 37.1 (A)  37.7 - 47.9 %   MCV 90.6 80 - 97 fL   MCH, POC 30.0 27 - 31.2 pg   MCHC 33.1 31.8 - 35.4 g/dL   RDW, POC 19.4 %   Platelet Count, POC 273 142 - 424 K/uL   MPV 7.8 0 - 99.8 fL  POCT Influenza A/B  Result Value Ref Range   Influenza A, POC Negative Negative   Influenza B, POC Negative Negative    ASSESSMENT AND PLAN:  65 y.o. year old female with persistent cough and fever despite no sign of pneumonia on CXR last week and relatively benign lab work    ICD-9-CM ICD-10-CM   1. COPD exacerbation (HCC) 491.21 J44.1 methylPREDNISolone (MEDROL) 4 MG tablet     levofloxacin (LEVAQUIN) 500 MG tablet     chlorpheniramine-HYDROcodone (TUSSIONEX PENNKINETIC ER) 10-8 MG/5ML SUER    Signed, [name] Haber, MD 08/12/2015 8:37 AM

## 2015-08-12 NOTE — Patient Instructions (Signed)
Chronic Obstructive Pulmonary Disease Chronic obstructive pulmonary disease (COPD) is a common lung condition in which airflow from the lungs is limited. COPD is a general term that can be used to describe many different lung problems that limit airflow, including both chronic bronchitis and emphysema. If you have COPD, your lung function will probably never return to normal, but there are measures you can take to improve lung function and make yourself feel better. CAUSES   Smoking (common).  Exposure to secondhand smoke.  Genetic problems.  Chronic inflammatory lung diseases or recurrent infections. SYMPTOMS  Shortness of breath, especially with physical activity.  Deep, persistent (chronic) cough with a large amount of thick mucus.  Wheezing.  Rapid breaths (tachypnea).  Gray or bluish discoloration (cyanosis) of the skin, especially in your fingers, toes, or lips.  Fatigue.  Weight loss.  Frequent infections or episodes when breathing symptoms become much worse (exacerbations).  Chest tightness. DIAGNOSIS Your health care provider will take a medical history and perform a physical examination to diagnose COPD. Additional tests for COPD may include:  Lung (pulmonary) function tests.  Chest X-ray.  CT scan.  Blood tests. TREATMENT  Treatment for COPD may include:  Inhaler and nebulizer medicines. These help manage the symptoms of COPD and make your breathing more comfortable.  Supplemental oxygen. Supplemental oxygen is only helpful if you have a low oxygen level in your blood.  Exercise and physical activity. These are beneficial for nearly all people with COPD.  Lung surgery or transplant.  Nutrition therapy to gain weight, if you are underweight.  Pulmonary rehabilitation. This may involve working with a team of health care providers and specialists, such as respiratory, occupational, and physical therapists. HOME CARE INSTRUCTIONS  Take all medicines  (inhaled or pills) as directed by your health care provider.  Avoid over-the-counter medicines or cough syrups that dry up your airway (such as antihistamines) and slow down the elimination of secretions unless instructed otherwise by your health care provider.  If you are a smoker, the most important thing that you can do is stop smoking. Continuing to smoke will cause further lung damage and breathing trouble. Ask your health care provider for help with quitting smoking. He or she can direct you to community resources or hospitals that provide support.  Avoid exposure to irritants such as smoke, chemicals, and fumes that aggravate your breathing.  Use oxygen therapy and pulmonary rehabilitation if directed by your health care provider. If you require home oxygen therapy, ask your health care provider whether you should purchase a pulse oximeter to measure your oxygen level at home.  Avoid contact with individuals who have a contagious illness.  Avoid extreme temperature and humidity changes.  Eat healthy foods. Eating smaller, more frequent meals and resting before meals may help you maintain your strength.  Stay active, but balance activity with periods of rest. Exercise and physical activity will help you maintain your ability to do things you want to do.  Preventing infection and hospitalization is very important when you have COPD. Make sure to receive all the vaccines your health care provider recommends, especially the pneumococcal and influenza vaccines. Ask your health care provider whether you need a pneumonia vaccine.  Learn and use relaxation techniques to manage stress.  Learn and use controlled breathing techniques as directed by your health care provider. Controlled breathing techniques include:  Pursed lip breathing. Start by breathing in (inhaling) through your nose for 1 second. Then, purse your lips as if you were   going to whistle and breathe out (exhale) through the  pursed lips for 2 seconds.  Diaphragmatic breathing. Start by putting one hand on your abdomen just above your waist. Inhale slowly through your nose. The hand on your abdomen should move out. Then purse your lips and exhale slowly. You should be able to feel the hand on your abdomen moving in as you exhale.  Learn and use controlled coughing to clear mucus from your lungs. Controlled coughing is a series of short, progressive coughs. The steps of controlled coughing are: 1. Lean your head slightly forward. 2. Breathe in deeply using diaphragmatic breathing. 3. Try to hold your breath for 3 seconds. 4. Keep your mouth slightly open while coughing twice. 5. Spit any mucus out into a tissue. 6. Rest and repeat the steps once or twice as needed. SEEK MEDICAL CARE IF:  You are coughing up more mucus than usual.  There is a change in the color or thickness of your mucus.  Your breathing is more labored than usual.  Your breathing is faster than usual. SEEK IMMEDIATE MEDICAL CARE IF:  You have shortness of breath while you are resting.  You have shortness of breath that prevents you from:  Being able to talk.  Performing your usual physical activities.  You have chest pain lasting longer than 5 minutes.  Your skin color is more cyanotic than usual.  You measure low oxygen saturations for longer than 5 minutes with a pulse oximeter. MAKE SURE YOU:  Understand these instructions.  Will watch your condition.  Will get help right away if you are not doing well or get worse.   This information is not intended to replace advice given to you by your health care provider. Make sure you discuss any questions you have with your health care provider.   Document Released: 03/20/2005 Document Revised: 07/01/2014 Document Reviewed: 02/04/2013 Elsevier Interactive Patient Education 2016 [employer]  

## 2015-08-16 ENCOUNTER — Ambulatory Visit (INDEPENDENT_AMBULATORY_CARE_PROVIDER_SITE_OTHER): Payer: Medicare Other | Admitting: Family Medicine

## 2015-08-16 VITALS — BP 140/68 | HR 82 | Temp 98.8°F | Resp 16 | Ht 64.0 in | Wt 95.6 lb

## 2015-08-16 DIAGNOSIS — D509 Iron deficiency anemia, unspecified: Secondary | ICD-10-CM | POA: Diagnosis not present

## 2015-08-16 DIAGNOSIS — G9331 Postviral fatigue syndrome: Secondary | ICD-10-CM

## 2015-08-16 DIAGNOSIS — G933 Postviral fatigue syndrome: Secondary | ICD-10-CM

## 2015-08-16 DIAGNOSIS — K909 Intestinal malabsorption, unspecified: Secondary | ICD-10-CM

## 2015-08-16 DIAGNOSIS — F17200 Nicotine dependence, unspecified, uncomplicated: Secondary | ICD-10-CM

## 2015-08-16 DIAGNOSIS — J449 Chronic obstructive pulmonary disease, unspecified: Secondary | ICD-10-CM

## 2015-08-16 DIAGNOSIS — R0902 Hypoxemia: Secondary | ICD-10-CM

## 2015-08-16 LAB — POCT URINALYSIS DIP (MANUAL ENTRY)
Bilirubin, UA: NEGATIVE
Glucose, UA: NEGATIVE
Leukocytes, UA: NEGATIVE
Nitrite, UA: NEGATIVE
PH UA: 5.5
PROTEIN UA: NEGATIVE
RBC UA: NEGATIVE
SPEC GRAV UA: 1.02
UROBILINOGEN UA: 0.2

## 2015-08-16 LAB — POC MICROSCOPIC URINALYSIS (UMFC): MUCUS RE: ABSENT

## 2015-08-16 LAB — POCT SEDIMENTATION RATE: POCT SED RATE: 8 mm/hr (ref 0–22)

## 2015-08-16 NOTE — Patient Instructions (Signed)
Iron-Rich Diet Iron is a mineral that helps your body to produce hemoglobin. Hemoglobin is a protein in your red blood cells that carries oxygen to your body's tissues. Eating too little iron may cause you to feel weak and tired, and it can increase your risk for infection. Eating enough iron is necessary for your body's metabolism, muscle function, and nervous system. Iron is naturally found in many foods. It can also be added to foods or fortified in foods. There are two types of dietary iron:  Heme iron. Heme iron is absorbed by the body more easily than nonheme iron. Heme iron is found in meat, poultry, and fish.  Nonheme iron. Nonheme iron is found in dietary supplements, iron-fortified grains, beans, and vegetables. You may need to follow an iron-rich diet if:  You have been diagnosed with iron deficiency or iron-deficiency anemia.  You have a condition that prevents you from absorbing dietary iron, such as:  Infection in your intestines.  Celiac disease. This involves long-lasting (chronic) inflammation of your intestines.  You do not eat enough iron.  You eat a diet that is high in foods that impair iron absorption.  You have lost a lot of blood.  You have heavy bleeding during your menstrual cycle.  You are pregnant. WHAT IS MY PLAN? Your health care provider may help you to determine how much iron you need per day based on your condition. Generally, when a person consumes sufficient amounts of iron in the diet, the following iron needs are met:  Men.  35-85 years old: 11 mg per day.  43-3 years old: 8 mg per day.  Women.   55-40 years old: 15 mg per day.  61-47 years old: 18 mg per day.  Over 53 years old: 8 mg per day.  Pregnant women: 27 mg per day.  Breastfeeding women: 9 mg per day. WHAT DO I NEED TO KNOW ABOUT AN IRON-RICH DIET?  Eat fresh fruits and vegetables that are high in vitamin C along with foods that are high in iron. This will help increase  the amount of iron that your body absorbs from food, especially with foods containing nonheme iron. Foods that are high in vitamin C include oranges, peppers, tomatoes, and mango.  Take iron supplements only as directed by your health care provider. Overdose of iron can be life-threatening. If you were prescribed iron supplements, take them with orange juice or a vitamin C supplement.  Cook foods in pots and pans that are made from iron.   Eat nonheme iron-containing foods alongside foods that are high in heme iron. This helps to improve your iron absorption.   Certain foods and drinks contain compounds that impair iron absorption. Avoid eating these foods in the same meal as iron-rich foods or with iron supplements. These include:  Coffee, black tea, and red wine.  Milk, dairy products, and foods that are high in calcium.  Beans, soybeans, and peas.  Whole grains.  When eating foods that contain both nonheme iron and compounds that impair iron absorption, follow these tips to absorb iron better.   Soak beans overnight before cooking.  Soak whole grains overnight and drain them before using.  Ferment flours before baking, such as using yeast in bread dough. WHAT FOODS CAN I EAT? Grains Iron-fortified breakfast cereal. Iron-fortified whole-wheat bread. Enriched rice. Sprouted grains. Vegetables Spinach. Potatoes with skin. Green peas. Broccoli. Red and green bell peppers. Fermented vegetables. Fruits Prunes. Raisins. Oranges. Strawberries. Mango. Grapefruit. Meats and Other Protein  Sources Beef liver. Oysters. Beef. Shrimp. [place]. Chicken. [place]. Sardines. Chickpeas. Nuts. Tofu. Beverages Tomato juice. Fresh orange juice. Prune juice. Hibiscus tea. Fortified instant breakfast shakes. Condiments Tahini. Fermented soy sauce. Sweets and Desserts Black-strap molasses.  Other Wheat germ. The items listed above may not be a complete list of recommended foods or  beverages. Contact your dietitian for more options. WHAT FOODS ARE NOT RECOMMENDED? Grains Whole grains. Bran cereal. Bran flour. Oats. Vegetables Artichokes. Brussels sprouts. Kale. Fruits Blueberries. Raspberries. Strawberries. Figs. Meats and Other Protein Sources Soybeans. Products made from soy protein. Dairy Milk. Cream. Cheese. Yogurt. Cottage cheese. Beverages Coffee. Black tea. Red wine. Sweets and Desserts Cocoa. Chocolate. Ice cream. Other Basil. Oregano. Parsley. The items listed above may not be a complete list of foods and beverages to avoid. Contact your dietitian for more information.   This information is not intended to replace advice given to you by your health care provider. Make sure you discuss any questions you have with your health care provider.   Document Released: 01/22/2005 Document Revised: 07/01/2014 Document Reviewed: 01/05/2014 Elsevier Interactive Patient Education [employer].

## 2015-08-17 DIAGNOSIS — J449 Chronic obstructive pulmonary disease, unspecified: Secondary | ICD-10-CM | POA: Diagnosis not present

## 2015-08-18 ENCOUNTER — Telehealth: Payer: Self-pay

## 2015-08-18 DIAGNOSIS — J449 Chronic obstructive pulmonary disease, unspecified: Secondary | ICD-10-CM | POA: Diagnosis not present

## 2015-08-18 MED ORDER — UNABLE TO FIND: Status: DC

## 2015-08-18 MED ORDER — UNABLE TO FIND: 1.0000 "application " | Freq: Once | Status: DC

## 2015-08-18 NOTE — Telephone Encounter (Signed)
Ordered it as instructed by Pamala Hurry

## 2015-08-18 NOTE — Telephone Encounter (Signed)
Pt called again to check on this. I told her that we had gotten a message from [facility] earlier and given them the OK to provide O2 since it sounded like pt preferred working through them and they had done the testing for pt. Pt stated that she already has a big O2 tank from [facility] but it is too heavy for her and she would like for [place] to send in an order to [facility] for a "pancake" O2 tank, that she can leave the house with. She is anxious to get this ordered today.   Called [facility] and was instr'd to order "Best Fit Evaluation for O2, possibly for [employer]". Judson Roch wrote the order and I faxed to [facility].

## 2015-08-18 NOTE — Telephone Encounter (Signed)
This is patient is calling to see about getting a smaller oxygen tank to be able to go out of the house with she saw dr Brigitte Pulse yesterday. When she first called about this I offered voicemail of referrals because they were not in yet and she has called again to check and they have not had a chance to try and change the order so taking this message   Best number [phone]

## 2015-08-19 DIAGNOSIS — J449 Chronic obstructive pulmonary disease, unspecified: Secondary | ICD-10-CM | POA: Diagnosis not present

## 2015-08-19 NOTE — Telephone Encounter (Signed)
Pt called again to check on the status of the O2 tank that was ordered. I explained that Judson Roch ordered this, but she wants to know when it will be ready for her. Please call to advise.

## 2015-08-20 NOTE — Progress Notes (Addendum)
Subjective:    Patient ID: [name], female    DOB: 1950/07/14, 65 y.o.   MRN: [id number] Chief Complaint  Patient presents with  . Follow-up    still taking the prednisone still not feeling better    HPI  [name], pt's roommate brough her in today as she has noticed pt feeling very fatigued and lethargic.  She has been ill with URI/COPD exac for the past mo.- likely contingous mult infections. All of her sxs are improving exeapt her energy. Sleeping  Very well at night. Abd pain/nausea at baseline.  Maida Sale she tries to do anything small - just a chore or two.will wear her out and then she is to tired to do anyting else the rest of the day. Has been eating decently but not great. Was supposed to do an o/n home pulse oximetry study last night but machie turned off and fell off finger - brought it with her so we can set up and she will just wear for the next 24 hrs.  She is very interested in stopping smoking but would like to try chantix. [name] has been monitoring her tob use and nagging her to stop - pt feels like she is in a place to try it again.  Past Medical History  Diagnosis Date  . Peptic ulcer disease     dr Oletta Lamas  . Acute duodenal ulcer with hemorrhage and perforation, with obstruction ([place])   . Barrett's esophagus   . Menopause   . GERD (gastroesophageal reflux disease)   . Asthma   . Chronic abdominal pain     narcotic dependence, dr gyarteng-dak at [facility]  . Narcotic abuse   . Anxiety     sees [name] NP at Dr. Radonna Ricker office  . Fx two ribs-open 08-24-11    left 4th and 5th  . Fx of fibula 07-28-11    left fibula, 2 places   . COPD (chronic obstructive pulmonary disease) ([place])   . Allergy   . Anemia   . History of blood transfusion     "related to OR; maybe when I perforated that ulcer"  . Lap Nissen + truncal vagotomy July 2008 05/13/2013  . LEG EDEMA 01/19/2008    Qualifier: Diagnosis of  By: [name] MD, [name]   . ACUTE DUODEN ULCER  W/HEMORR PERF&OBSTRUCTION 06/26/2004    Qualifier: Diagnosis of  By: [name] MD, [name]   . FEVER, RECURRENT 08/11/2009    Qualifier: Diagnosis of  By: [name] MD, [name] MENOPAUSE 01/07/2007    Qualifier: Diagnosis of  By: [name], CMA, [place]    . Gastroparesis 06/03/2007    Qualifier: Diagnosis of  By: [name] MD, [name]   . GASTRIC OUTLET OBSTRUCTION 08/28/2007    In July 2008 she underwent laparoscopic enterolysis, Nissen fundoplication over a [id number] bougie, single pledgeted suture colosure of the hiatus and a 3 suture wrap.  Lap truncal vagotomy and a loop gastrojejunostomy was performed.  This was revised in August of 2009 to a roux en Y gastrojejujostomy.     . 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 Depression   . History of hiatal hernia   . Complication of anesthesia     pt states had too much xanax on board - agitated   . Anginal pain ([place])     "many many years ago"  . Exertional shortness of breath   . Pneumonia, organism unspecified 11/07/2010  Assoc with R Parapneumonic effusion 09/2010    - Tapped 10/05/10    - CxR resolved 11/07/2010    . Headache     "monthly" (09/06/2014)  . Arthritis     "hips" (09/06/2014)  . Myocardial infarction ([place])   . S/P jejunostomy Feb 2016 07/26/2014   Past Surgical History  Procedure Laterality Date  . Esophagogastroduodenoscopy  12-29-09    dr qadeer at [facility], several gastric ulcers  . Laparoscopic nissen fundoplication    . Gastrojejunostomy  02-20-08    Roux en Y gastrojejunsotomy w 1 foot Roux limb  . Repair of perforated ulcer    . Biopsy thyroid  08-13-11    benign nodule, per Dr. Melida Quitter   . Esophagogastroduodenoscopy N/A 09/25/2012    Procedure: ESOPHAGOGASTRODUODENOSCOPY (EGD);  Surgeon: Beryle Beams, MD;  Location: [facility];  Service: Endoscopy;  Laterality: N/A;  . Fracture surgery    . Tonsillectomy    . Cataract extraction w/ intraocular lens  implant, bilateral Bilateral   . Laparoscopic partial  gastrectomy N/A 06/29/2013    Procedure: Gastrectomy and GJ Tube placement;  Surgeon: Pedro Earls, MD;  Location: [facility];  Service: General;  Laterality: N/A;  . Colonoscopy    . Gastrostomy N/A 07/26/2014    Procedure: LAPRASCOPIC ASSISTED OPEN PLACEMENT OF JEJUNOSTOMY TUBE;  Surgeon: Pedro Earls, MD;  Location: [facility];  Service: General;  Laterality: N/A;  . Orif hip fracture Bilateral     "pins in both of them"  . Dilation and curettage of uterus    . Hernia repair      "hiatal"  . Joint replacement     Current Outpatient Prescriptions on File Prior to Visit  Medication Sig Dispense Refill  . ALPRAZolam (XANAX) 1 MG tablet Take 1 tablet (1 mg total) by mouth 3 (three) times daily as needed for anxiety. This is a 1 month supply 90 tablet 0  . Aspirin-Acetaminophen-Caffeine (GOODY HEADACHE PO) Take 1 packet by mouth 4 (four) times daily as needed (pain). Reported on 06/23/2015    . DULoxetine (CYMBALTA) 60 MG capsule Take 1 capsule (60 mg total) by mouth daily. 30 capsule 5  . HYDROcodone-acetaminophen (NORCO) 10-325 MG tablet Take 1-2 tablets by mouth every 6 (six) hours as needed for moderate pain. This is a 1 month supply. 240 tablet 0  . OLANZapine (ZYPREXA) 10 MG tablet Take 1 tablet (10 mg total) by mouth at bedtime. 30 tablet 1  . ondansetron (ZOFRAN ODT) 4 MG disintegrating tablet Take 1-2 tablets (4-8 mg total) by mouth every 8 (eight) hours as needed for nausea or vomiting. 180 tablet 0  . albuterol (PROVENTIL HFA;VENTOLIN HFA) 108 (90 Base) MCG/ACT inhaler Inhale 2 puffs into the lungs every 4 (four) hours as needed (cough, shortness of breath or wheezing.). (Patient not taking: Reported on 08/16/2015) 1 Inhaler 3  . Calcium Citrate-Vitamin D (CALCIUM CITRATE CHEWY BITE) 500-500 MG-UNIT CHEW Chew 1 tablet by mouth 2 (two) times daily. (Patient not taking: Reported on 08/16/2015) 180 tablet 3  . feeding supplement (BOOST / RESOURCE BREEZE) LIQD Take 1 Container by mouth 3 (three)  times daily between meals. (Patient not taking: Reported on 08/16/2015) 90 Container 11  . Guaifenesin (MUCINEX MAXIMUM STRENGTH) 1200 MG TB12 Take 1 tablet (1,200 mg total) by mouth every 12 (twelve) hours as needed. (Patient not taking: Reported on 08/16/2015) 14 tablet 1  . ipratropium-albuterol (DUONEB) 0.5-2.5 (3) MG/3ML SOLN Take 3 mLs by nebulization 4 (four) times daily -  before meals and at bedtime. (Patient not taking: Reported on 08/16/2015) 360 mL 3  . levofloxacin (LEVAQUIN) 500 MG tablet Take 1 tablet (500 mg total) by mouth daily. (Patient not taking: Reported on 08/16/2015) 7 tablet 0  . methylPREDNISolone (MEDROL) 4 MG tablet Take 1 tablet (4 mg total) by mouth daily. As directed on dosepak (Patient not taking: Reported on 08/16/2015) 21 tablet 0  . pantoprazole (PROTONIX) 40 MG tablet Take 1 tablet (40 mg total) by mouth 2 (two) times daily before a meal. (Patient not taking: Reported on 08/16/2015) 180 tablet 3  . polyethylene glycol powder (GLYCOLAX/MIRALAX) powder Take 17 g by mouth 2 (two) times daily as needed. (Patient not taking: Reported on 08/16/2015) 850 g 11   No current facility-administered medications on file prior to visit.   Allergies  Allergen Reactions  . Lithium Nausea And Vomiting  . Celebrex [Celecoxib] Other (See Comments)    History of bleeding ulcers  . Morphine Itching  . Quetiapine Other (See Comments)     tardive dyskinesia  . Varenicline Tartrate Other (See Comments)    hallucinations   Family History  Problem Relation Age of Onset  . Cancer Mother     kidney, magliant tumor on face  . Stroke Mother   . Celiac disease Father   . Cancer Father     kidney and pancreatic   Social History   Social History  . Marital Status: Divorced    Spouse Name: N/A  . Number of Children: N/A  . Years of Education: N/A   Social History Main Topics  . Smoking status: Current Every Day Smoker -- 2.00 packs/day for 50 years    Types: Cigarettes  . Smokeless  tobacco: Never Used  . Alcohol Use: No  . Drug Use: No  . Sexual Activity: No   Other Topics Concern  . None   Social History Narrative     Review of Systems  Constitutional: Positive for activity change, appetite change and fatigue. Negative for fever and chills.  HENT: Negative for congestion, rhinorrhea and sore throat.   Respiratory: Positive for cough, chest tightness and shortness of breath. Negative for wheezing.   Cardiovascular: Negative for chest pain, palpitations and leg swelling.  Gastrointestinal: Positive for nausea and abdominal pain. Negative for vomiting, diarrhea and constipation.  Musculoskeletal: Negative for back pain and arthralgias.  Neurological: Positive for weakness.  Hematological: Positive for adenopathy. Bruises/bleeds easily.  Psychiatric/Behavioral: Negative for sleep disturbance.       Objective:  BP 140/68 mmHg  Pulse 82  Temp(Src) 98.8 F (37.1 C) (Oral)  Resp 16  Ht 5\' 4"  (1.626 m)  Wt 95 lb 9.6 oz (43.364 kg)  BMI 16.40 kg/m2  SpO2 73%  Physical Exam  Constitutional: She is oriented to person, place, and time. She appears well-developed and well-nourished. No distress.  HENT:  Head: Normocephalic and atraumatic.  Right Ear: External ear normal.  Left Ear: External ear normal.  Eyes: Conjunctivae are normal. No scleral icterus.  Neck: Normal range of motion. Neck supple. No thyromegaly present.  Cardiovascular: Normal rate, regular rhythm, normal heart sounds and intact distal pulses.   Pulmonary/Chest: Effort normal. No accessory muscle usage. No respiratory distress. She has decreased breath sounds. She has no wheezes. She has no rhonchi. She has no rales.  Musculoskeletal: She exhibits no edema.  Lymphadenopathy:    She has no cervical adenopathy.  Neurological: She is alert and oriented to person, place, and time.  Skin: Skin is  warm and dry. She is not diaphoretic. No erythema.  Psychiatric: She has a normal mood and affect.  Her behavior is normal.   On RA at rest O2 sat 98%.   Pulse ox on exertion on RA 73%. Pulse ox on exertion on 2L O2 by [place] 93%  Results for orders placed or performed in visit on 08/16/15  POCT urinalysis dipstick  Result Value Ref Range   Color, UA yellow yellow   Clarity, UA clear clear   Glucose, UA negative negative   Bilirubin, UA negative negative   Ketones, POC UA trace (5) (A) negative   Spec Grav, UA 1.020    Blood, UA negative negative   pH, UA 5.5    Protein Ur, POC negative negative   Urobilinogen, UA 0.2    Nitrite, UA Negative Negative   Leukocytes, UA Negative Negative  POCT Microscopic Urinalysis ([facility])  Result Value Ref Range   WBC,UR,HPF,POC None None WBC/hpf   RBC,UR,HPF,POC None None RBC/hpf   Bacteria None None, Too numerous to count   Mucus Absent Absent   Epithelial Cells, UR Per Microscopy None None, Too numerous to count cells/hpf  POCT SEDIMENTATION RATE  Result Value Ref Range   POCT SED RATE 8 0 - 22 mm/hr       Assessment & Plan:   1. Postviral fatigue syndrome   2. Anemia, iron deficiency   3. Malabsorption   4. Tobacco use disorder - pt interested in starting chantix but advise pt to defer starting until next OV when pt is more recovered from uris and energy beter  5. Hypoxia   6. COPD with hypoxia ([place]) - start home O2 2L [place]. Consider full spirometry at next routine OV and poss starting chronic maintenance inh.    Orders Placed This Encounter  Procedures  . For home use only DME oxygen    Order Specific Question:  Mode or (Route)    Answer:  Nasal cannula    Order Specific Question:  Liters per Minute    Answer:  2    Order Specific Question:  Frequency    Answer:  Continuous (stationary and portable oxygen unit needed)    Order Specific Question:  Oxygen conserving device    Answer:  Yes    Order Specific Question:  Oxygen delivery system    Answer:  Gas  . POCT urinalysis dipstick  . POCT Microscopic Urinalysis ([facility])  . POCT  SEDIMENTATION RATE    Meds ordered this encounter  Medications  . omeprazole (PRILOSEC) 40 MG capsule    Sig: Take 40 mg by mouth daily.  . Ergocalciferol (VITAMIN D2 PO)    Sig: Take by mouth.   Over 40 min spent in face-to-face evaluation of and consultation with patient and coordination of care.  Over 50% of this time was spent counseling this patient.   [name], MD MPH

## 2015-08-21 ENCOUNTER — Telehealth: Payer: Self-pay

## 2015-08-21 NOTE — Telephone Encounter (Signed)
The patient called to request an order for a portable oxygen system, which she called a "pancake."  Dr Brigitte Pulse placed an order for an oxygen tank, but she states that it is too heavy for her to ambulate with.  Please advise, thank you.  CB#: [phone]

## 2015-08-22 DIAGNOSIS — J449 Chronic obstructive pulmonary disease, unspecified: Secondary | ICD-10-CM | POA: Diagnosis not present

## 2015-08-22 NOTE — Telephone Encounter (Signed)
[facility] should have contacted patient.

## 2015-08-23 ENCOUNTER — Ambulatory Visit (INDEPENDENT_AMBULATORY_CARE_PROVIDER_SITE_OTHER): Payer: Medicare Other | Admitting: Family Medicine

## 2015-08-23 VITALS — BP 122/78 | HR 72 | Temp 98.7°F | Resp 17 | Ht 64.0 in | Wt 98.2 lb

## 2015-08-23 DIAGNOSIS — F4323 Adjustment disorder with mixed anxiety and depressed mood: Secondary | ICD-10-CM | POA: Diagnosis not present

## 2015-08-23 DIAGNOSIS — G894 Chronic pain syndrome: Secondary | ICD-10-CM

## 2015-08-23 DIAGNOSIS — R636 Underweight: Secondary | ICD-10-CM | POA: Diagnosis not present

## 2015-08-23 MED ORDER — BUDESONIDE-FORMOTEROL FUMARATE 160-4.5 MCG/ACT IN AERO: 2.0000 | INHALATION_SPRAY | Freq: Two times a day (BID) | RESPIRATORY_TRACT | Status: DC

## 2015-08-23 MED ORDER — ALPRAZOLAM 1 MG PO TABS: 1.0000 mg | ORAL_TABLET | Freq: Three times a day (TID) | ORAL | Status: DC | PRN

## 2015-08-23 NOTE — Patient Instructions (Addendum)
I think you have a lot of hidden issues around the stress of your mother's dementia and I know you have some concerns you are masking about whether this might present in you - you need to see a therapist for this.  You have nothing to loose and lot to gain from an outside perspective.  I think bringing Christ with you to either the first or second appointment could be helpful as well. You initially had an appointment with Krystal Eaton at the [facility] at [place] can call the [place] at [phone] or [phone] for immediate assistance. Among several different types of services, they offer an Intensive oupatient program for mood disorders - which is a group type setting Monday-Friday 9-noon.  You can schedule an assessment by calling the above numbers during which the costs for the program and insurance benefits will be reviewed.    No psychological or psychiatric services take physician referrals - they always want the patient to call. Some excellent private psychiatrists for individual counseling are:  [facility]  [place], [place] [address] Phone: [phone]  [facility]  [address], [place], [place] [address]  Phone:(336) [place], [place], [place] [address]  Phone: [phone]   [facility] [address], [place], [place] [address]  Phone:(336) [place] [address] [place], [place] Phone: [place], [place], [place], [place], [place] [address] Phone: [phone]  You can always go to [facility] if suicidal or there are walk-in crisis services available at: [facility] [place], [place] [address] [phone]  Hour of  operations: Monday-Friday, 8:30-5 p.m.  They take medicare/medicaid and the patient can contact [facility] referral department at [phone] or [email or web address] for more information.

## 2015-08-23 NOTE — Progress Notes (Addendum)
Subjective:  By signing my name below, I, [name], attest that this documentation has been prepared under the direction and in the presence of [name], MD. Electronically Signed: [name], [place]. 08/23/2015 , 9:19 AM .  Patient was seen in Room 4 .   Patient ID: [name], female    DOB: 1950-07-17, 65 y.o.   MRN: [id number] Chief Complaint  Patient presents with  . Medication Refill    xanax,albuterol,calcium,cymbalta,guaifenesin,norco,duoneb,levaquin,medrol,rosoruce breeze,zyprexa,prilosec,zofran,protonix,   HPI [name] is a 65 y.o. female who presents to [facility] for medication refill.  When she was here 5 days ago, at that point, she was still feeling quite fatigue, though her acute respiratory symptoms had improved; at rest her oxygen was fine but declined to 70s with some ambulation, she was continued on nasal cannula, continued with home oxygen. She was also interested in starting chantix which I think is a good idea but I wanted patient to fully recover from her acute illness prior. [facility] portable o2 tank, but she did not have a portable, orders faxed to [facility] for this. But [facility] tried to get her oxygen ordered. Still not heard from [facility] regarding this.   Pt is having a lot of trouble with her long-time roommate. She states that her roommate has a gun and patient is scared. Although, in the past, her roommate would always bring her in to be seen when patient was feeling ill.   Past Medical History  Diagnosis Date  . Peptic ulcer disease     dr Oletta Lamas  . Acute duodenal ulcer with hemorrhage and perforation, with obstruction ([place])   . Barrett's esophagus   . Menopause   . GERD (gastroesophageal reflux disease)   . Asthma   . Chronic abdominal pain     narcotic dependence, dr gyarteng-dak [facility]  . Narcotic abuse   . Anxiety     sees [name] NP at Dr. Radonna Ricker office  . Fx two ribs-open 08-24-11    left 4th and  5th  . Fx of fibula 07-28-11    left fibula, 2 places   . COPD (chronic obstructive pulmonary disease) ([place])   . Allergy   . Anemia   . History of blood transfusion     "related to OR; maybe when I perforated that ulcer"  . Lap Nissen + truncal vagotomy July 2008 05/13/2013  . LEG EDEMA 01/19/2008    Qualifier: Diagnosis of  By: [name] MD, [name]   . ACUTE DUODEN ULCER W/HEMORR PERF&OBSTRUCTION 06/26/2004    Qualifier: Diagnosis of  By: [name] MD, [name]   . FEVER, RECURRENT 08/11/2009    Qualifier: Diagnosis of  By: [name] MD, [name] MENOPAUSE 01/07/2007    Qualifier: Diagnosis of  By: [name], CMA, [place]    . Gastroparesis 06/03/2007    Qualifier: Diagnosis of  By: [name] MD, [name]   . GASTRIC OUTLET OBSTRUCTION 08/28/2007    In July 2008 she underwent laparoscopic enterolysis, Nissen fundoplication over a 99991111 bougie, single pledgeted suture colosure of the hiatus and a 3 suture wrap.  Lap truncal vagotomy and a loop gastrojejunostomy was performed.  This was revised in August of 2009 to a roux en Y gastrojejujostomy.     . 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 Depression   . History of hiatal hernia   . Complication of anesthesia     pt states had  too much xanax on board - agitated   . Anginal pain ([place])     "many many years ago"  . Exertional shortness of breath   . Pneumonia, organism unspecified 11/07/2010    Assoc with R Parapneumonic effusion 09/2010    - Tapped 10/05/10    - CxR resolved 11/07/2010    . Headache     "monthly" (09/06/2014)  . Arthritis     "hips" (09/06/2014)  . Myocardial infarction ([place])   . 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 Authorizing Provider  albuterol (PROVENTIL HFA;VENTOLIN HFA) 108 (90 Base) MCG/ACT inhaler Inhale 2 puffs into the lungs every 4 (four) hours as needed (cough, shortness of breath or wheezing.). Patient not taking: Reported on  08/16/2015 08/01/15   Shawnee Knapp, MD  ALPRAZolam [name]) 1 MG tablet Take 1 tablet (1 mg total) by mouth 3 (three) times daily as needed for anxiety. This is a 1 month supply 07/30/15   Shawnee Knapp, MD  Aspirin-Acetaminophen-Caffeine (GOODY HEADACHE PO) Take 1 packet by mouth 4 (four) times daily as needed (pain). Reported on 06/23/2015    Historical Provider, MD  Calcium Citrate-Vitamin D (CALCIUM CITRATE CHEWY BITE) 500-500 MG-UNIT CHEW Chew 1 tablet by mouth 2 (two) times daily. Patient not taking: Reported on 08/16/2015 04/17/15   Shawnee Knapp, MD  DULoxetine (CYMBALTA) 60 MG capsule Take 1 capsule (60 mg total) by mouth daily. 06/23/15   Shawnee Knapp, MD  Ergocalciferol (VITAMIN D2 PO) Take by mouth.    Historical Provider, MD  feeding supplement (BOOST / RESOURCE BREEZE) LIQD Take 1 Container by mouth 3 (three) times daily between meals. Patient not taking: Reported on 08/16/2015 07/20/15   Shawnee Knapp, MD  Guaifenesin [facility] MAXIMUM STRENGTH) 1200 MG TB12 Take 1 tablet (1,200 mg total) by mouth every 12 (twelve) hours as needed. Patient not taking: Reported on 08/16/2015 07/20/15   Shawnee Knapp, MD  HYDROcodone-acetaminophen [facility]) 10-325 MG tablet Take 1-2 tablets by mouth every 6 (six) hours as needed for moderate pain. This is a 1 month supply. 07/30/15   Shawnee Knapp, MD  ipratropium-albuterol (DUONEB) 0.5-2.5 (3) MG/3ML SOLN Take 3 mLs by nebulization 4 (four) times daily -  before meals and at bedtime. Patient not taking: Reported on 08/16/2015 06/03/15   Robyn Haber, MD  levofloxacin (LEVAQUIN) 500 MG tablet Take 1 tablet (500 mg total) by mouth daily. Patient not taking: Reported on 08/16/2015 08/12/15   Robyn Haber, MD  methylPREDNISolone (MEDROL) 4 MG tablet Take 1 tablet (4 mg total) by mouth daily. As directed on dosepak Patient not taking: Reported on 08/16/2015 08/12/15   Robyn Haber, MD  OLANZapine (ZYPREXA) 10 MG tablet Take 1 tablet (10 mg total) by mouth at bedtime. 06/23/15   Shawnee Knapp, MD  omeprazole (PRILOSEC) 40 MG capsule Take 40 mg by mouth daily.    Historical Provider, MD  ondansetron (ZOFRAN ODT) 4 MG disintegrating tablet Take 1-2 tablets (4-8 mg total) by mouth every 8 (eight) hours as needed for nausea or vomiting. 07/20/15   Shawnee Knapp, MD  pantoprazole (PROTONIX) 40 MG tablet Take 1 tablet (40 mg total) by mouth 2 (two) times daily before a meal. Patient not taking: Reported on 08/16/2015 06/03/15   Robyn Haber, MD  polyethylene glycol powder (GLYCOLAX/MIRALAX) powder Take 17 g by mouth 2 (two) times daily as needed. Patient not taking: Reported  on 08/16/2015 04/12/15   Shawnee Knapp, MD  UNABLE TO FIND Place 1 application into the nose once. Med Name: best Fit evaluation for oxygen, possibly simply go mini 08/18/15   Mancel Bale, PA-C  UNABLE TO FIND Med Name: Best fit evaluation for oxygen - possible simply go mini 08/18/15   Mancel Bale, PA-C   Allergies  Allergen Reactions  . Lithium Nausea And Vomiting  . Celebrex [Celecoxib] Other (See Comments)    History of bleeding ulcers  . Morphine Itching  . Quetiapine Other (See Comments)     tardive dyskinesia  . Varenicline Tartrate Other (See Comments)    hallucinations    Review of Systems  Constitutional: Positive for fever. Negative for chills, diaphoresis, activity change and fatigue.  Respiratory: Negative for shortness of breath and wheezing.   Gastrointestinal: Negative for nausea, vomiting and diarrhea.  Skin: Negative for rash and wound.  Psychiatric/Behavioral: The patient is nervous/anxious.        Objective:   Physical Exam  Constitutional: She is oriented to person, place, and time. She appears well-developed and well-nourished. No distress.  HENT:  Head: Normocephalic and atraumatic.  Eyes: EOM are normal. Pupils are equal, round, and reactive to light.  Neck: Neck supple.  Cardiovascular: Normal rate.   Pulmonary/Chest: Effort normal. No respiratory distress.  Musculoskeletal:  Normal range of motion.  Neurological: She is alert and oriented to person, place, and time.  Skin: Skin is warm and dry.  Psychiatric: She has a normal mood and affect. Her behavior is normal.  Nursing note and vitals reviewed.  BP 122/78 mmHg  Pulse 72  Temp(Src) 98.7 F (37.1 C) (Oral)  Resp 17  Ht 5\' 4"  (1.626 m)  Wt 98 lb 3.2 oz (44.543 kg)  BMI 16.85 kg/m2  SpO2 91%    Assessment & Plan:  Continue on 2L continuous O2, continue on the mucine Change oxygen care to [facility] from [facility]; [facility] finally delivered the oxygen, the [facility] delivered the standing but not respond to the oxygen Feeling dramatically better, still having low grade fevers, but energy improved, has not need to use albuterol or nebulizer  Pt is consistently very confused about her medicatoins - stating that her roommate is controlling them and won't let her keep her own xanax and hydrocodone. She doesn't know how often she uses them. HIGHLY non-compliant.  Pt advised she should file a police report, encouraged to make sure her money is secure with a financial advisor/lawyer  Needs to make appt with psych.  1. Adjustment disorder with mixed anxiety and depressed mood   2. Chronic pain syndrome   3. Underweight    Meds ordered this encounter  Medications  . ALPRAZolam (XANAX) 1 MG tablet    Sig: Take 1 tablet (1 mg total) by mouth 3 (three) times daily as needed for anxiety. This is a 1 month supply    Dispense:  90 tablet    Refill:  0  . budesonide-formoterol (SYMBICORT) 160-4.5 MCG/ACT inhaler    Sig: Inhale 2 puffs into the lungs 2 (two) times daily.    Dispense:  1 Inhaler    Refill:  3    I personally performed the services described in this documentation, which was scribed in my presence. The recorded information has been reviewed and considered, and addended by me as needed.  [name], MD MPH

## 2015-08-23 NOTE — Progress Notes (Deleted)
Subjective:    Patient ID: [name], female    DOB: 23-Sep-1950, 65 y.o.   MRN: [id number] Chief Complaint  Patient presents with  . Medication Refill    xanax,albuterol,calcium,cymbalta,guaifenesin,norco,duoneb,levaquin,medrol,rosoruce breeze,zyprexa,prilosec,zofran,protonix,    HPI Pt is having a lot of trouble with her long-time room mate. Pt is scared. She does't know what her roommate would due to her if threatened. Roommate won't let pt keep any of her controlled meds and doses them to her - nags her a lot.   Pt thinks that her roommate is going to try to kill her or wait for her to die to get pts inheritance $$.  . Past Medical History  Diagnosis Date  . Peptic ulcer disease     dr Oletta Lamas  . Acute duodenal ulcer with hemorrhage and perforation, with obstruction ([place])   . Barrett's esophagus   . Menopause   . GERD (gastroesophageal reflux disease)   . Asthma   . Chronic abdominal pain     narcotic dependence, dr gy[facility]eng-dak [facility]  . Narcotic abuse   . Anxiety     sees [name] NP at Dr. Radonna Ricker office  . Fx two ribs-open 08-24-11    left 4th and 5th  . Fx of fibula 07-28-11    left fibula, 2 places   . COPD (chronic obstructive pulmonary disease) ([place])   . Allergy   . Anemia   . History of blood transfusion     "related to OR; maybe when I perforated that ulcer"  . Lap Nissen + truncal vagotomy July 2008 05/13/2013  . LEG EDEMA 01/19/2008    Qualifier: Diagnosis of  By: [name] MD, [name]   . ACUTE DUODEN ULCER W/HEMORR PERF&OBSTRUCTION 06/26/2004    Qualifier: Diagnosis of  By: [name] MD, [name]   . FEVER, RECURRENT 08/11/2009    Qualifier: Diagnosis of  By: [name] MD, [name] MENOPAUSE 01/07/2007    Qualifier: Diagnosis of  By: [name], CMA, [place]    . Gastroparesis 06/03/2007    Qualifier: Diagnosis of  By: [name] MD, [name]   . GASTRIC OUTLET OBSTRUCTION 08/28/2007    In July 2008 she underwent laparoscopic enterolysis, Nissen  fundoplication over a [id number] bougie, single pledgeted suture colosure of the hiatus and a 3 suture wrap.  Lap truncal vagotomy and a loop gastrojejunostomy was performed.  This was revised in August of 2009 to a roux en Y gastrojejujostomy.     . 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 Depression   . History of hiatal hernia   . Complication of anesthesia     pt states had too much xanax on board - agitated   . Anginal pain ([place])     "many many years ago"  . Exertional shortness of breath   . Pneumonia, organism unspecified 11/07/2010    Assoc with R Parapneumonic effusion 09/2010    - Tapped 10/05/10    - CxR resolved 11/07/2010    . Headache     "monthly" (09/06/2014)  . Arthritis     "hips" (09/06/2014)  . Myocardial infarction ([place])   . S/P jejunostomy Feb 2016 07/26/2014   Past Surgical History  Procedure Laterality Date  . Esophagogastroduodenoscopy  12-29-09    dr qadeer at [facility], several gastric ulcers  . Laparoscopic nissen fundoplication    . Gastrojejunostomy  02-20-08    Roux en Y gastrojejunsotomy w 1 foot Roux limb  .  Repair of perforated ulcer    . Biopsy thyroid  08-13-11    benign nodule, per Dr. Melida Quitter   . Esophagogastroduodenoscopy N/A 09/25/2012    Procedure: ESOPHAGOGASTRODUODENOSCOPY (EGD);  Surgeon: Beryle Beams, MD;  Location: [facility];  Service: Endoscopy;  Laterality: N/A;  . Fracture surgery    . Tonsillectomy    . Cataract extraction w/ intraocular lens  implant, bilateral Bilateral   . Laparoscopic partial gastrectomy N/A 06/29/2013    Procedure: Gastrectomy and GJ Tube placement;  Surgeon: Pedro Earls, MD;  Location: [facility];  Service: General;  Laterality: N/A;  . Colonoscopy    . Gastrostomy N/A 07/26/2014    Procedure: LAPRASCOPIC ASSISTED OPEN PLACEMENT OF JEJUNOSTOMY TUBE;  Surgeon: Pedro Earls, MD;  Location: [facility];  Service: General;  Laterality: N/A;  . Orif hip fracture Bilateral     "pins in both of them"  .  Dilation and curettage of uterus    . Hernia repair      "hiatal"  . Joint replacement     Current Outpatient Prescriptions on File Prior to Visit  Medication Sig Dispense Refill  . Aspirin-Acetaminophen-Caffeine (GOODY HEADACHE PO) Take 1 packet by mouth 4 (four) times daily as needed (pain). Reported on 06/23/2015    . Calcium Citrate-Vitamin D (CALCIUM CITRATE CHEWY BITE) 500-500 MG-UNIT CHEW Chew 1 tablet by mouth 2 (two) times daily. 180 tablet 3  . DULoxetine (CYMBALTA) 60 MG capsule Take 1 capsule (60 mg total) by mouth daily. 30 capsule 5  . Ergocalciferol (VITAMIN D2 PO) Take by mouth.    . feeding supplement (BOOST / RESOURCE BREEZE) LIQD Take 1 Container by mouth 3 (three) times daily between meals. 90 Container 11  . Guaifenesin (MUCINEX MAXIMUM STRENGTH) 1200 MG TB12 Take 1 tablet (1,200 mg total) by mouth every 12 (twelve) hours as needed. 14 tablet 1  . ipratropium-albuterol (DUONEB) 0.5-2.5 (3) MG/3ML SOLN Take 3 mLs by nebulization 4 (four) times daily -  before meals and at bedtime. 360 mL 3  . OLANZapine (ZYPREXA) 10 MG tablet Take 1 tablet (10 mg total) by mouth at bedtime. 30 tablet 1  . omeprazole (PRILOSEC) 40 MG capsule Take 40 mg by mouth daily.    . ondansetron (ZOFRAN ODT) 4 MG disintegrating tablet Take 1-2 tablets (4-8 mg total) by mouth every 8 (eight) hours as needed for nausea or vomiting. 180 tablet 0  . polyethylene glycol powder (GLYCOLAX/MIRALAX) powder Take 17 g by mouth 2 (two) times daily as needed. 850 g 11  . UNABLE TO FIND Place 1 application into the nose once. Med Name: best Fit evaluation for oxygen, possibly simply go mini 1 Device 0  . UNABLE TO FIND Med Name: Best fit evaluation for oxygen - possible simply go mini 1 Device 0  . albuterol (PROVENTIL HFA;VENTOLIN HFA) 108 (90 Base) MCG/ACT inhaler Inhale 2 puffs into the lungs every 4 (four) hours as needed (cough, shortness of breath or wheezing.). 1 Inhaler 3   No current facility-administered  medications on file prior to visit.   Allergies  Allergen Reactions  . Lithium Nausea And Vomiting  . Celebrex [Celecoxib] Other (See Comments)    History of bleeding ulcers  . Morphine Itching  . Quetiapine Other (See Comments)     tardive dyskinesia  . Varenicline Tartrate Other (See Comments)    hallucinations   Family History  Problem Relation Age of Onset  . Cancer Mother     kidney, magliant tumor  on face  . Stroke Mother   . Celiac disease Father   . Cancer Father     kidney and pancreatic   Social History   Social History  . Marital Status: Divorced    Spouse Name: N/A  . Number of Children: N/A  . Years of Education: N/A   Social History Main Topics  . Smoking status: Current Every Day Smoker -- 2.00 packs/day for 50 years    Types: Cigarettes  . Smokeless tobacco: Never Used  . Alcohol Use: No  . Drug Use: No  . Sexual Activity: No   Other Topics Concern  . None   Social History Narrative     Review of Systems  Constitutional: Positive for activity change and fatigue. Negative for fever, chills, diaphoresis, appetite change and unexpected weight change.  Respiratory: Negative for cough, chest tightness, shortness of breath and wheezing.   Gastrointestinal: Positive for nausea, abdominal pain and constipation. Negative for vomiting and diarrhea.  Musculoskeletal: Positive for myalgias, back pain, arthralgias and gait problem. Negative for joint swelling.  Skin: Negative for color change and rash.  Allergic/Immunologic: Negative for immunocompromised state.  Neurological: Negative for weakness and numbness.  Psychiatric/Behavioral: Positive for behavioral problems, confusion, sleep disturbance, dysphoric mood, decreased concentration and agitation. Negative for suicidal ideas, hallucinations and self-injury. The patient is nervous/anxious. The patient is not hyperactive.        Objective:  BP 122/78 mmHg  Pulse 72  Temp(Src) 98.7 F (37.1 C) (Oral)   Resp 17  Ht 5\' 4"  (1.626 m)  Wt 98 lb 3.2 oz (44.543 kg)  BMI 16.85 kg/m2  SpO2 91%  Physical Exam  Constitutional: She is oriented to person, place, and time. She appears well-developed. She appears cachectic. No distress.  HENT:  Head: Normocephalic and atraumatic.  Right Ear: External ear normal.  Eyes: Conjunctivae are normal. No scleral icterus.  Pulmonary/Chest: Effort normal.  Neurological: She is alert and oriented to person, place, and time.  Skin: Skin is warm and dry. She is not diaphoretic. No erythema.  Psychiatric: She has a normal mood and affect. Her behavior is normal.          Assessment & Plan:  Cont on 2L continuous O2, continue on the mucinex  1. Adjustment disorder with mixed anxiety and depressed mood   2. Chronic pain syndrome   3. Underweight   Pt is consistently very confused about her medicatoins - stating that her roommate is controlling them and won't let her keep her own xanax and hydrocodone. She doesn't know how often she uses them. HIGHLY non-compliant.  Pt advised she should file a police report, encouraged to make sure her money is secure with a financial advisor/lawyer  Needs to make appt with psych.  Meds ordered this encounter  Medications  . ALPRAZolam (XANAX) 1 MG tablet    Sig: Take 1 tablet (1 mg total) by mouth 3 (three) times daily as needed for anxiety. This is a 1 month supply    Dispense:  90 tablet    Refill:  0  . budesonide-formoterol (SYMBICORT) 160-4.5 MCG/ACT inhaler    Sig: Inhale 2 puffs into the lungs 2 (two) times daily.    Dispense:  1 Inhaler    Refill:  3    [name], MD MPH

## 2015-08-26 ENCOUNTER — Ambulatory Visit (INDEPENDENT_AMBULATORY_CARE_PROVIDER_SITE_OTHER): Payer: Medicare Other | Admitting: Family Medicine

## 2015-08-26 VITALS — BP 110/70 | HR 60 | Temp 98.0°F | Resp 18 | Ht 63.0 in | Wt 94.5 lb

## 2015-08-26 DIAGNOSIS — G894 Chronic pain syndrome: Secondary | ICD-10-CM

## 2015-08-26 DIAGNOSIS — T7411XA Adult physical abuse, confirmed, initial encounter: Secondary | ICD-10-CM

## 2015-08-26 MED ORDER — HYDROCODONE-ACETAMINOPHEN 10-325 MG PO TABS: 1.0000 | ORAL_TABLET | Freq: Four times a day (QID) | ORAL | Status: DC | PRN
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
## 2015-08-28 ENCOUNTER — Other Ambulatory Visit: Payer: Self-pay | Admitting: Family Medicine

## 2015-08-29 DIAGNOSIS — J449 Chronic obstructive pulmonary disease, unspecified: Secondary | ICD-10-CM | POA: Diagnosis not present

## 2015-09-01 ENCOUNTER — Ambulatory Visit: Payer: Medicare Other

## 2015-09-01 ENCOUNTER — Telehealth: Payer: Self-pay

## 2015-09-01 DIAGNOSIS — G894 Chronic pain syndrome: Secondary | ICD-10-CM

## 2015-09-01 MED ORDER — HYDROCODONE-ACETAMINOPHEN 10-325 MG PO TABS: 1.0000 | ORAL_TABLET | Freq: Four times a day (QID) | ORAL | Status: DC | PRN

## 2015-09-01 NOTE — Telephone Encounter (Signed)
Does she mean she just lost the paper rx which she hadn't filled yet?  If that is the case then I have reprinted it and she can pick it up.  If she finds the prior paper rx, she will need to shred it as if it is filled, it will show up in the drug database and which we will first alert pt and then the police who will be able to find whoever fills it since the pharmacy has a picture ID of whoever picks up the rx.

## 2015-09-01 NOTE — Telephone Encounter (Signed)
Pt is here to be seen

## 2015-09-01 NOTE — Telephone Encounter (Signed)
Pt called back with out giving her name that she would like to talk with dr Raul Del assistant or someone that could help her with the problem that she has" misplaced" her hydrocodone medication rx and needs a new one written to get filled today. Told her that Dr. Brigitte Pulse would be the only one that could make this decision.  She stated she feels really bad and that she is not sure she can come in and wait   Best number [phone]
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
## 2015-09-02 ENCOUNTER — Telehealth: Payer: Self-pay

## 2015-09-02 NOTE — Telephone Encounter (Signed)
Pt says she thought you gave her a prescription for xanax also, but she cant find it.  [phone]

## 2015-09-03 NOTE — Telephone Encounter (Signed)
Patient cannot find her scripts for ALPRAZolam [name]) 1 MG tablet HYDROcodone-acetaminophen (NORCO) 10-325 MG tablet   [phone] (H)
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
## 2015-09-05 ENCOUNTER — Ambulatory Visit (INDEPENDENT_AMBULATORY_CARE_PROVIDER_SITE_OTHER): Payer: Medicare Other | Admitting: Family Medicine

## 2015-09-05 VITALS — BP 116/68 | HR 81 | Temp 99.2°F | Resp 16 | Ht 63.0 in | Wt 97.6 lb

## 2015-09-05 DIAGNOSIS — R509 Fever, unspecified: Secondary | ICD-10-CM

## 2015-09-05 DIAGNOSIS — R110 Nausea: Secondary | ICD-10-CM | POA: Diagnosis not present

## 2015-09-05 DIAGNOSIS — M791 Myalgia, unspecified site: Secondary | ICD-10-CM

## 2015-09-05 LAB — POCT INFLUENZA A/B
INFLUENZA B, POC: NEGATIVE
Influenza A, POC: NEGATIVE

## 2015-09-05 MED ORDER — PHENYLEPH-PROMETHAZINE-COD 5-6.25-10 MG/5ML PO SYRP: 10.0000 mL | ORAL_SOLUTION | ORAL | Status: DC | PRN

## 2015-09-05 MED ORDER — PREDNISONE 20 MG PO TABS: ORAL_TABLET | ORAL | Status: DC

## 2015-09-05 NOTE — Patient Instructions (Addendum)
   IF you received an x-ray today, you will receive an invoice from [facility]. Please contact [facility] at [phone] with questions or concerns regarding your invoice.   IF you received labwork today, you will receive an invoice from [facility]. Please contact [facility] at [phone] with questions or concerns regarding your invoice.   Our billing staff will not be able to assist you with questions regarding bills from these companies.  You will be contacted with the lab results as soon as they are available. The fastest way to get your results is to activate your My Chart account. Instructions are located on the last page of this paperwork. If you have not heard from us regarding the results in 2 weeks, please contact this office.     Influenza, Adult Influenza ("the flu") is a viral infection of the respiratory tract. It occurs more often in winter months because people spend more time in close contact with one another. Influenza can make you feel very sick. Influenza easily spreads from person to person (contagious). CAUSES  Influenza is caused by a virus that infects the respiratory tract. You can catch the virus by breathing in droplets from an infected person's cough or sneeze. You can also catch the virus by touching something that was recently contaminated with the virus and then touching your mouth, nose, or eyes. RISKS AND COMPLICATIONS You may be at risk for a more severe case of influenza if you smoke cigarettes, have diabetes, have chronic heart disease (such as heart failure) or lung disease (such as asthma), or if you have a weakened immune system. Elderly people and pregnant women are also at risk for more serious infections. The most common problem of influenza is a lung infection (pneumonia). Sometimes, this problem can require emergency medical care and may be life threatening. SIGNS AND SYMPTOMS  Symptoms typically last 4  to 10 days and may include:  Fever.  Chills.  Headache, body aches, and muscle aches.  Sore throat.  Chest discomfort and cough.  Poor appetite.  Weakness or feeling tired.  Dizziness.  Nausea or vomiting. DIAGNOSIS  Diagnosis of influenza is often made based on your history and a physical exam. A nose or throat swab test can be done to confirm the diagnosis. TREATMENT  In mild cases, influenza goes away on its own. Treatment is directed at relieving symptoms. For more severe cases, your health care provider may prescribe antiviral medicines to shorten the sickness. Antibiotic medicines are not effective because the infection is caused by a virus, not by bacteria. HOME CARE INSTRUCTIONS  Take medicines only as directed by your health care provider.  Use a cool mist humidifier to make breathing easier.  Get plenty of rest until your temperature returns to normal. This usually takes 3 to 4 days.  Drink enough fluid to keep your urine clear or pale yellow.  Cover yourmouth and nosewhen coughing or sneezing,and wash your handswellto prevent thevirusfrom spreading.  Stay homefromwork orschool untilthe fever is gonefor at least 1full day. PREVENTION  An annual influenza vaccination (flu shot) is the best way to avoid getting influenza. An annual flu shot is now routinely recommended for all adults in the [place] SEEK MEDICAL CARE IF:  You experiencechest pain, yourcough worsens,or you producemore mucus.  Youhave nausea,vomiting, ordiarrhea.  Your fever returns or gets worse. SEEK IMMEDIATE MEDICAL CARE IF:  You havetrouble breathing, you become short of breath,or your skin ornails becomebluish.  You have severe painor   stiffnessin the neck.  You develop a sudden headache, or pain in the face or ear.  You have nausea or vomiting that you cannot control. MAKE SURE YOU:   Understand these instructions.  Will watch your condition.  Will get help  right away if you are not doing well or get worse.   This information is not intended to replace advice given to you by your health care provider. Make sure you discuss any questions you have with your health care provider.   Document Released: 06/07/2000 Document Revised: 07/01/2014 Document Reviewed: 09/09/2011 Elsevier Interactive Patient Education 2016 Elsevier Inc.  

## 2015-09-05 NOTE — Progress Notes (Addendum)
Subjective:    Patient ID: [name], female    DOB: 1950/07/08, 64 y.o.   MRN: [id number] By signing my name below, I, Zola Button, attest that this documentation has been prepared under the direction and in the presence of [name], MD.  Electronically Signed: Zola Button, Medical Scribe. 09/05/2015. 3:49 PM.  Chief Complaint  Patient presents with  . Follow-up    been running a fever   HPI HPI Comments: [name] is a 65 y.o. female who presents to the [facility] complaining of gradual onset, low-grade fever with T-max 99.8 F that started 4-5 days ago. Patient reports having associated generalized myalgias. She has been taking aspirin a few times a day. Patient denies sinus pressure and diaphoresis. She has been sleeping fine. She has been urinating and making bowel movements without any problems. She has been using the O2 at home.  She requests refill on her alprazolam and hydrocodone but it is not time for either of these chronic meds.  Depression screen [facility] 2/9 09/05/2015 08/23/2015 08/07/2015 07/30/2015 07/28/2015  Decreased Interest 0 0 0 0 0  Down, Depressed, Hopeless 0 0 0 0 0  PHQ - 2 Score 0 0 0 0 0  Altered sleeping - - - - -  Tired, decreased energy - - - - -  Change in appetite - - - - -  Feeling bad or failure about yourself  - - - - -  Trouble concentrating - - - - -  Moving slowly or fidgety/restless - - - - -  Suicidal thoughts - - - - -  PHQ-9 Score - - - - -   Past Medical History  Diagnosis Date  . Peptic ulcer disease     dr Oletta Lamas  . Acute duodenal ulcer with hemorrhage and perforation, with obstruction ([place])   . Barrett's esophagus   . Menopause   . GERD (gastroesophageal reflux disease)   . Asthma   . Chronic abdominal pain     narcotic dependence, dr gyarteng-dak at [facility]  . Narcotic abuse   . Anxiety     sees [name] NP at Dr. Radonna Ricker office  . Fx two ribs-open 08-24-11    left 4th and 5th  . Fx of  fibula 07-28-11    left fibula, 2 places   . COPD (chronic obstructive pulmonary disease) ([place])   . Allergy   . Anemia   . History of blood transfusion     "related to OR; maybe when I perforated that ulcer"  . Lap Nissen + truncal vagotomy July 2008 05/13/2013  . LEG EDEMA 01/19/2008    Qualifier: Diagnosis of  By: [name] MD, [name]   . ACUTE DUODEN ULCER W/HEMORR PERF&OBSTRUCTION 06/26/2004    Qualifier: Diagnosis of  By: [name] MD, [name]   . FEVER, RECURRENT 08/11/2009    Qualifier: Diagnosis of  By: [name] MD, [name] MENOPAUSE 01/07/2007    Qualifier: Diagnosis of  By: [name], CMA, [place]    . Gastroparesis 06/03/2007    Qualifier: Diagnosis of  By: [name] MD, [name]   . GASTRIC OUTLET OBSTRUCTION 08/28/2007    In July 2008 she underwent laparoscopic enterolysis, Nissen fundoplication over a [id number] bougie, single pledgeted suture colosure of the hiatus and a 3 suture wrap.  Lap truncal vagotomy and a loop gastrojejunostomy was performed.  This was revised in August of 2009 to a roux en Y gastrojejujostomy.     Marland Kitchen  Chronic duodenal ulcer with gastric outlet obstruction 06/29/2013  . Depression   . History of hiatal hernia   . Complication of anesthesia     pt states had too much xanax on board - agitated   . Anginal pain ([place])     "many many years ago"  . Exertional shortness of breath   . Pneumonia, organism unspecified 11/07/2010    Assoc with R Parapneumonic effusion 09/2010    - Tapped 10/05/10    - CxR resolved 11/07/2010    . Headache     "monthly" (09/06/2014)  . Arthritis     "hips" (09/06/2014)  . Myocardial infarction ([place])   . S/P jejunostomy Feb 2016 07/26/2014   Past Surgical History  Procedure Laterality Date  . Esophagogastroduodenoscopy  12-29-09    dr qadeer at [facility], several gastric ulcers  . Laparoscopic nissen fundoplication    . Gastrojejunostomy  02-20-08    Roux en Y gastrojejunsotomy w 1 foot Roux limb  . Repair of perforated ulcer    . Biopsy thyroid  08-13-11     benign nodule, per Dr. Melida Quitter   . Esophagogastroduodenoscopy N/A 09/25/2012    Procedure: ESOPHAGOGASTRODUODENOSCOPY (EGD);  Surgeon: Beryle Beams, MD;  Location: [facility];  Service: Endoscopy;  Laterality: N/A;  . Fracture surgery    . Tonsillectomy    . Cataract extraction w/ intraocular lens  implant, bilateral Bilateral   . Laparoscopic partial gastrectomy N/A 06/29/2013    Procedure: Gastrectomy and GJ Tube placement;  Surgeon: Pedro Earls, MD;  Location: [facility];  Service: General;  Laterality: N/A;  . Colonoscopy    . Gastrostomy N/A 07/26/2014    Procedure: LAPRASCOPIC ASSISTED OPEN PLACEMENT OF JEJUNOSTOMY TUBE;  Surgeon: Pedro Earls, MD;  Location: [facility];  Service: General;  Laterality: N/A;  . Orif hip fracture Bilateral     "pins in both of them"  . Dilation and curettage of uterus    . Hernia repair      "hiatal"  . Joint replacement     Current Outpatient Prescriptions on File Prior to Visit  Medication Sig Dispense Refill  . albuterol (PROVENTIL HFA;VENTOLIN HFA) 108 (90 Base) MCG/ACT inhaler Inhale 2 puffs into the lungs every 4 (four) hours as needed (cough, shortness of breath or wheezing.). 1 Inhaler 3  . ALPRAZolam (XANAX) 1 MG tablet Take 1 tablet (1 mg total) by mouth 3 (three) times daily as needed for anxiety. This is a 1 month supply 90 tablet 0  . Aspirin-Acetaminophen-Caffeine (GOODY HEADACHE PO) Take 1 packet by mouth 4 (four) times daily as needed (pain). Reported on 06/23/2015    . budesonide-formoterol (SYMBICORT) 160-4.5 MCG/ACT inhaler Inhale 2 puffs into the lungs 2 (two) times daily. 1 Inhaler 3  . Calcium Citrate-Vitamin D (CALCIUM CITRATE CHEWY BITE) 500-500 MG-UNIT CHEW Chew 1 tablet by mouth 2 (two) times daily. 180 tablet 3  . DULoxetine (CYMBALTA) 60 MG capsule Take 1 capsule (60 mg total) by mouth daily. 30 capsule 5  . Ergocalciferol (VITAMIN D2 PO) Take by mouth.    . feeding supplement (BOOST / RESOURCE BREEZE) LIQD Take 1  Container by mouth 3 (three) times daily between meals. 90 Container 11  . Guaifenesin (MUCINEX MAXIMUM STRENGTH) 1200 MG TB12 Take 1 tablet (1,200 mg total) by mouth every 12 (twelve) hours as needed. 14 tablet 1  . HYDROcodone-acetaminophen (NORCO) 10-325 MG tablet Take 1-2 tablets by mouth every 6 (six) hours as needed for moderate pain. This  is a 1 month supply. 240 tablet 0  . ipratropium-albuterol (DUONEB) 0.5-2.5 (3) MG/3ML SOLN Take 3 mLs by nebulization 4 (four) times daily -  before meals and at bedtime. 360 mL 3  . OLANZapine (ZYPREXA) 10 MG tablet Take 1 tablet (10 mg total) by mouth at bedtime. 30 tablet 1  . omeprazole (PRILOSEC) 40 MG capsule Take 40 mg by mouth daily.    . ondansetron (ZOFRAN ODT) 4 MG disintegrating tablet Take 1-2 tablets (4-8 mg total) by mouth every 8 (eight) hours as needed for nausea or vomiting. 180 tablet 0  . polyethylene glycol powder (GLYCOLAX/MIRALAX) powder Take 17 g by mouth 2 (two) times daily as needed. 850 g 11  . UNABLE TO FIND Place 1 application into the nose once. Med Name: best Fit evaluation for oxygen, possibly simply go mini 1 Device 0  . UNABLE TO FIND Med Name: Best fit evaluation for oxygen - possible simply go mini 1 Device 0   No current facility-administered medications on file prior to visit.   Allergies  Allergen Reactions  . Lithium Nausea And Vomiting  . Celebrex [Celecoxib] Other (See Comments)    History of bleeding ulcers  . Morphine Itching  . Quetiapine Other (See Comments)     tardive dyskinesia  . Varenicline Tartrate Other (See Comments)    hallucinations   Family History  Problem Relation Age of Onset  . Cancer Mother     kidney, magliant tumor on face  . Stroke Mother   . Celiac disease Father   . Cancer Father     kidney and pancreatic   Social History   Social History  . Marital Status: Divorced    Spouse Name: N/A  . Number of Children: N/A  . Years of Education: N/A   Social History Main Topics    . Smoking status: Current Every Day Smoker -- 2.00 packs/day for 50 years    Types: Cigarettes  . Smokeless tobacco: Never Used  . Alcohol Use: No  . Drug Use: No  . Sexual Activity: No   Other Topics Concern  . None   Social History Narrative      Review of Systems  Constitutional: Positive for fever, chills, activity change and fatigue. Negative for diaphoresis, appetite change and unexpected weight change.  HENT: Negative for congestion, postnasal drip, rhinorrhea, sinus pressure and sore throat.   Respiratory: Positive for cough. Negative for apnea, choking, chest tightness, shortness of breath and wheezing.   Gastrointestinal: Positive for nausea and abdominal pain. Negative for vomiting, diarrhea and constipation.  Genitourinary: Negative for dysuria, decreased urine volume and difficulty urinating.  Musculoskeletal: Positive for myalgias, back pain, arthralgias and gait problem. Negative for joint swelling.  Neurological: Positive for weakness and light-headedness. Negative for dizziness and numbness.  Hematological: Negative for adenopathy.  Psychiatric/Behavioral: Positive for behavioral problems, confusion, sleep disturbance and decreased concentration. Negative for suicidal ideas and dysphoric mood. The patient is nervous/anxious. The patient is not hyperactive.        Objective:  BP 116/68 mmHg  Pulse 81  Temp(Src) 99.2 F (37.3 C) (Oral)  Resp 16  Ht 5\' 3"  (1.6 m)  Wt 97 lb 9.6 oz (44.271 kg)  BMI 17.29 kg/m2  SpO2 93%  Physical Exam  Constitutional: She is oriented to person, place, and time. She appears well-developed and well-nourished. No distress.  HENT:  Head: Normocephalic and atraumatic.  Mouth/Throat: Oropharynx is clear and moist. No oropharyngeal exudate.  Eyes: Pupils are equal, round,  and reactive to light.  Neck: Neck supple.  Cardiovascular: Normal rate.   Pulmonary/Chest: Effort normal.  Musculoskeletal: She exhibits no edema.   Neurological: She is alert and oriented to person, place, and time. No cranial nerve deficit.  Skin: Skin is warm and dry. No rash noted.  Psychiatric: She has a normal mood and affect. Her behavior is normal.  Nursing note and vitals reviewed.     Results for orders placed or performed in visit on 09/05/15  POCT Influenza A/B  Result Value Ref Range   Influenza A, POC Negative Negative   Influenza B, POC Negative Negative    Assessment & Plan:   1. Fever, unspecified   2. Myalgia   3. Nausea without vomiting   Exam reassuring, suspect viral URI vs COPD exac.  Cont continuous home O2 (not wearing in office today). Cont sched nebs.  Today I have utilized the [place] Controlled Substance Registry's online query to confirm compliance regarding the patient's narcotic pain medications. My review reveals appropriate prescription fills and that [facility] is the sole provider of these medications. Rechecks will occur regularly and the patient is aware of our use of the system.  Orders Placed This Encounter  Procedures  . POCT Influenza A/B    Meds ordered this encounter  Medications  . predniSONE (DELTASONE) 20 MG tablet    Sig: Take 3 tabs po qd x 3d, 2 tabs po qd x 3d, 1 tab po qd x 3d    Dispense:  18 tablet    Refill:  0  . Phenyleph-Promethazine-Cod (PROMETHAZINE VC/CODEINE) 5-6.25-10 MG/5ML SYRP    Sig: Take 10 mLs by mouth every 4 (four) hours as needed.    Dispense:  180 mL    Refill:  0   Over 25 min spent in face-to-face evaluation of and consultation with patient and coordination of care.  Over 50% of this time was spent counseling this patient.  I personally performed the services described in this documentation, which was scribed in my presence. The recorded information has been reviewed and considered, and addended by me as needed.  [name], MD MPH

## 2015-09-08 ENCOUNTER — Other Ambulatory Visit: Payer: Self-pay

## 2015-09-08 DIAGNOSIS — Z1231 Encounter for screening mammogram for malignant neoplasm of breast: Secondary | ICD-10-CM

## 2015-09-15 ENCOUNTER — Other Ambulatory Visit: Payer: Self-pay | Admitting: Family Medicine

## 2015-09-15 DIAGNOSIS — G894 Chronic pain syndrome: Secondary | ICD-10-CM

## 2015-09-15 NOTE — Telephone Encounter (Signed)
Patient request a refill on cough medicine. [employer] [facility] [phone]

## 2015-09-15 NOTE — Telephone Encounter (Signed)
Dr Shaw

## 2015-09-16 ENCOUNTER — Ambulatory Visit (INDEPENDENT_AMBULATORY_CARE_PROVIDER_SITE_OTHER): Payer: Medicare Other | Admitting: Family Medicine

## 2015-09-16 VITALS — BP 102/64 | HR 86 | Temp 98.4°F | Resp 18 | Ht 63.0 in | Wt 94.6 lb

## 2015-09-16 DIAGNOSIS — J449 Chronic obstructive pulmonary disease, unspecified: Secondary | ICD-10-CM | POA: Diagnosis not present

## 2015-09-16 DIAGNOSIS — F4323 Adjustment disorder with mixed anxiety and depressed mood: Secondary | ICD-10-CM | POA: Diagnosis not present

## 2015-09-16 DIAGNOSIS — J441 Chronic obstructive pulmonary disease with (acute) exacerbation: Secondary | ICD-10-CM | POA: Diagnosis not present

## 2015-09-16 MED ORDER — HYDROCODONE-HOMATROPINE 5-1.5 MG/5ML PO SYRP: 5.0000 mL | ORAL_SOLUTION | Freq: Three times a day (TID) | ORAL | Status: DC | PRN

## 2015-09-16 MED ORDER — ALPRAZOLAM 1 MG PO TABS: 1.0000 mg | ORAL_TABLET | Freq: Three times a day (TID) | ORAL | Status: DC | PRN

## 2015-09-16 MED ORDER — ONDANSETRON 4 MG PO TBDP: 4.0000 mg | ORAL_TABLET | Freq: Three times a day (TID) | ORAL | Status: DC | PRN

## 2015-09-16 NOTE — Patient Instructions (Addendum)
   IF you received an x-ray today, you will receive an invoice from [facility]. Please contact [facility] at [phone] with questions or concerns regarding your invoice.   IF you received labwork today, you will receive an invoice from [facility]. Please contact [facility] at [phone] with questions or concerns regarding your invoice.   Our billing staff will not be able to assist you with questions regarding bills from these companies.  You will be contacted with the lab results as soon as they are available. The fastest way to get your results is to activate your My Chart account. Instructions are located on the last page of this paperwork. If you have not heard from us regarding the results in 2 weeks, please contact this office.    Chronic Obstructive Pulmonary Disease Chronic obstructive pulmonary disease (COPD) is a common lung condition in which airflow from the lungs is limited. COPD is a general term that can be used to describe many different lung problems that limit airflow, including both chronic bronchitis and emphysema. If you have COPD, your lung function will probably never return to normal, but there are measures you can take to improve lung function and make yourself feel better. CAUSES   Smoking (common).  Exposure to secondhand smoke.  Genetic problems.  Chronic inflammatory lung diseases or recurrent infections. SYMPTOMS  Shortness of breath, especially with physical activity.  Deep, persistent (chronic) cough with a large amount of thick mucus.  Wheezing.  Rapid breaths (tachypnea).  Gray or bluish discoloration (cyanosis) of the skin, especially in your fingers, toes, or lips.  Fatigue.  Weight loss.  Frequent infections or episodes when breathing symptoms become much worse (exacerbations).  Chest tightness. DIAGNOSIS Your health care provider will take a medical history and perform a physical  examination to diagnose COPD. Additional tests for COPD may include:  Lung (pulmonary) function tests.  Chest X-ray.  CT scan.  Blood tests. TREATMENT  Treatment for COPD may include:  Inhaler and nebulizer medicines. These help manage the symptoms of COPD and make your breathing more comfortable.  Supplemental oxygen. Supplemental oxygen is only helpful if you have a low oxygen level in your blood.  Exercise and physical activity. These are beneficial for nearly all people with COPD.  Lung surgery or transplant.  Nutrition therapy to gain weight, if you are underweight.  Pulmonary rehabilitation. This may involve working with a team of health care providers and specialists, such as respiratory, occupational, and physical therapists. HOME CARE INSTRUCTIONS  Take all medicines (inhaled or pills) as directed by your health care provider.  Avoid over-the-counter medicines or cough syrups that dry up your airway (such as antihistamines) and slow down the elimination of secretions unless instructed otherwise by your health care provider.  If you are a smoker, the most important thing that you can do is stop smoking. Continuing to smoke will cause further lung damage and breathing trouble. Ask your health care provider for help with quitting smoking. He or she can direct you to community resources or hospitals that provide support.  Avoid exposure to irritants such as smoke, chemicals, and fumes that aggravate your breathing.  Use oxygen therapy and pulmonary rehabilitation if directed by your health care provider. If you require home oxygen therapy, ask your health care provider whether you should purchase a pulse oximeter to measure your oxygen level at home.  Avoid contact with individuals who have a contagious illness.  Avoid extreme temperature and humidity changes.    Eat healthy foods. Eating smaller, more frequent meals and resting before meals may help you maintain your  strength.  Stay active, but balance activity with periods of rest. Exercise and physical activity will help you maintain your ability to do things you want to do.  Preventing infection and hospitalization is very important when you have COPD. Make sure to receive all the vaccines your health care provider recommends, especially the pneumococcal and influenza vaccines. Ask your health care provider whether you need a pneumonia vaccine.  Learn and use relaxation techniques to manage stress.  Learn and use controlled breathing techniques as directed by your health care provider. Controlled breathing techniques include:  Pursed lip breathing. Start by breathing in (inhaling) through your nose for 1 second. Then, purse your lips as if you were going to whistle and breathe out (exhale) through the pursed lips for 2 seconds.  Diaphragmatic breathing. Start by putting one hand on your abdomen just above your waist. Inhale slowly through your nose. The hand on your abdomen should move out. Then purse your lips and exhale slowly. You should be able to feel the hand on your abdomen moving in as you exhale.  Learn and use controlled coughing to clear mucus from your lungs. Controlled coughing is a series of short, progressive coughs. The steps of controlled coughing are: 1. Lean your head slightly forward. 2. Breathe in deeply using diaphragmatic breathing. 3. Try to hold your breath for 3 seconds. 4. Keep your mouth slightly open while coughing twice. 5. Spit any mucus out into a tissue. 6. Rest and repeat the steps once or twice as needed. SEEK MEDICAL CARE IF:  You are coughing up more mucus than usual.  There is a change in the color or thickness of your mucus.  Your breathing is more labored than usual.  Your breathing is faster than usual. SEEK IMMEDIATE MEDICAL CARE IF:  You have shortness of breath while you are resting.  You have shortness of breath that prevents you from:  Being  able to talk.  Performing your usual physical activities.  You have chest pain lasting longer than 5 minutes.  Your skin color is more cyanotic than usual.  You measure low oxygen saturations for longer than 5 minutes with a pulse oximeter. MAKE SURE YOU:  Understand these instructions.  Will watch your condition.  Will get help right away if you are not doing well or get worse.   This information is not intended to replace advice given to you by your health care provider. Make sure you discuss any questions you have with your health care provider.   Document Released: 03/20/2005 Document Revised: 07/01/2014 Document Reviewed: 02/04/2013 Elsevier Interactive Patient Education 2016 Elsevier Inc.  

## 2015-09-16 NOTE — Progress Notes (Signed)
Subjective:  By signing my name below, I, [name], attest that this documentation has been prepared under the direction and in the presence of [name], MD.  Electronically Signed: [name], ED Scribe. 09/16/2015. 11:39 AM.   Patient ID: [name], female    DOB: [birth year], 65 y.o.   MRN: [id number]  HPI Chief Complaint  Patient presents with  . Follow-up    still running fevers    HPI Comments: [name] is a 65 y.o. female who presents to the [facility] follow up. Pt has persistent a cough and low grade fever. She has ran out of cough syrup and would like a refill. She has one more day left of prednisone. She is on Symbicort 2 puffs twice a day, albuterol as needed but has not needed it and has mucinex at home.  She is on O2 tank at home and also has a portable tank. She feels O2 has been working well for her but that her at home tank is not strong enough. She has not stopped smoking but has cut back tremendously. She believes cuting back on smoking has contributed to cough.  Pt states that she need to get out more and exercise. She would like zofran and xanax She had 100 cc promethazine, cough syrup filled 11 days prior at [facility],. Hydrocodone filled on 3/10. Alprazolam 1mg  tId filled 3/1.   Patient Active Problem List   Diagnosis Date Noted  . Abdominal pain   . Hypoxia 04/08/2015  . Opioid use with withdrawal ([place])   . Protein-calorie malnutrition ([place])   . Episode of recurrent major depressive disorder ([place])   . Secondary hyperparathyroidism ([place]) 01/10/2015  . Vitamin D deficiency 01/10/2015  . 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 Anemia, iron deficiency 11/03/2014  . Chronic pain syndrome 10/24/2014  . Osteoporosis 10/05/2014  . Vertebral compression fracture ([place]) 10/05/2014  . Coronary artery disease due to calcified coronary lesion 10/05/2014  . Weight loss   . Tobacco abuse   . History of subtotal gastrectomy with  Roux-en-Y recontruction   . Orthostatic hypotension 08/11/2014  . Severe protein-calorie malnutrition ([place]) 12/30/2013  . Constipation, chronic 07/10/2013  . Chronic abdominal pain   . COPD (chronic obstructive pulmonary disease) ([place]) 11/07/2010  . Major depressive disorder, recurrent episode, moderate ([place]) 06/06/2010  . 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 Anxiety state 01/07/2007  . BARRETT'S ESOPHAGUS, HX OF 07/03/2005   Past Medical History  Diagnosis Date  . Peptic ulcer disease     dr Oletta Lamas  . Acute duodenal ulcer with hemorrhage and perforation, with obstruction ([place])   . Barrett's esophagus   . Menopause   . GERD (gastroesophageal reflux disease)   . Asthma   . Chronic abdominal pain     narcotic dependence, dr gyarteng-dak at [facility]  . Narcotic abuse   . Anxiety     sees [name] NP at Dr. Radonna Ricker office  . Fx two ribs-open 08-24-11    left 4th and 5th  . Fx of fibula 07-28-11    left fibula, 2 places   . COPD (chronic obstructive pulmonary disease) ([place])   . Allergy   . Anemia   . History of blood transfusion     "related to OR; maybe when I perforated that ulcer"  . Lap Nissen + truncal vagotomy July 2008 05/13/2013  . LEG EDEMA 01/19/2008    Qualifier: Diagnosis of  By: [name] MD, [name]   .  ACUTE DUODEN ULCER W/HEMORR PERF&OBSTRUCTION 06/26/2004    Qualifier: Diagnosis of  By: [name] MD, [name]   . FEVER, RECURRENT 08/11/2009    Qualifier: Diagnosis of  By: [name] MD, [name] MENOPAUSE 01/07/2007    Qualifier: Diagnosis of  By: [name], CMA, [place]    . Gastroparesis 06/03/2007    Qualifier: Diagnosis of  By: [name] MD, [name]   . GASTRIC OUTLET OBSTRUCTION 08/28/2007    In July 2008 she underwent laparoscopic enterolysis, Nissen fundoplication over a [id number] bougie, single pledgeted suture colosure of the hiatus and a 3 suture wrap.  Lap truncal vagotomy and a loop gastrojejunostomy was performed.  This was revised in August of 2009 to a roux en Y  gastrojejujostomy.     . 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 Depression   . History of hiatal hernia   . Complication of anesthesia     pt states had too much xanax on board - agitated   . Anginal pain ([place])     "many many years ago"  . Exertional shortness of breath   . Pneumonia, organism unspecified 11/07/2010    Assoc with R Parapneumonic effusion 09/2010    - Tapped 10/05/10    - CxR resolved 11/07/2010    . Headache     "monthly" (09/06/2014)  . Arthritis     "hips" (09/06/2014)  . Myocardial infarction ([place])   . S/P jejunostomy Feb 2016 07/26/2014   Past Surgical History  Procedure Laterality Date  . Esophagogastroduodenoscopy  12-29-09    dr qadeer at [facility], several gastric ulcers  . Laparoscopic nissen fundoplication    . Gastrojejunostomy  02-20-08    Roux en Y gastrojejunsotomy w 1 foot Roux limb  . Repair of perforated ulcer    . Biopsy thyroid  08-13-11    benign nodule, per Dr. Melida Quitter   . Esophagogastroduodenoscopy N/A 09/25/2012    Procedure: ESOPHAGOGASTRODUODENOSCOPY (EGD);  Surgeon: Beryle Beams, MD;  Location: [facility];  Service: Endoscopy;  Laterality: N/A;  . Fracture surgery    . Tonsillectomy    . Cataract extraction w/ intraocular lens  implant, bilateral Bilateral   . Laparoscopic partial gastrectomy N/A 06/29/2013    Procedure: Gastrectomy and GJ Tube placement;  Surgeon: Pedro Earls, MD;  Location: [facility];  Service: General;  Laterality: N/A;  . Colonoscopy    . Gastrostomy N/A 07/26/2014    Procedure: LAPRASCOPIC ASSISTED OPEN PLACEMENT OF JEJUNOSTOMY TUBE;  Surgeon: Pedro Earls, MD;  Location: [facility];  Service: General;  Laterality: N/A;  . Orif hip fracture Bilateral     "pins in both of them"  . Dilation and curettage of uterus    . Hernia repair      "hiatal"  . Joint replacement     Allergies  Allergen Reactions  . Lithium Nausea And Vomiting  . Celebrex [Celecoxib] Other (See Comments)    History  of bleeding ulcers  . Morphine Itching  . Quetiapine Other (See Comments)     tardive dyskinesia  . Varenicline Tartrate Other (See Comments)    hallucinations   Prior to Admission medications   Medication Sig Start Date End Date Taking? Authorizing Provider  albuterol (PROVENTIL HFA;VENTOLIN HFA) 108 (90 Base) MCG/ACT inhaler Inhale 2 puffs into the lungs every 4 (four) hours as needed (cough, shortness of breath or wheezing.). 08/01/15  Yes Shawnee Knapp, MD  ALPRAZolam [name]) 1 MG tablet Take 1  tablet (1 mg total) by mouth 3 (three) times daily as needed for anxiety. This is a 1 month supply 08/23/15  Yes Shawnee Knapp, MD  Aspirin-Acetaminophen-Caffeine (GOODY HEADACHE PO) Take 1 packet by mouth 4 (four) times daily as needed (pain). Reported on 06/23/2015   Yes Historical Provider, MD  budesonide-formoterol (SYMBICORT) 160-4.5 MCG/ACT inhaler Inhale 2 puffs into the lungs 2 (two) times daily. 08/23/15  Yes Shawnee Knapp, MD  Calcium Citrate-Vitamin D (CALCIUM CITRATE CHEWY BITE) 500-500 MG-UNIT CHEW Chew 1 tablet by mouth 2 (two) times daily. 04/17/15  Yes Shawnee Knapp, MD  DULoxetine (CYMBALTA) 60 MG capsule Take 1 capsule (60 mg total) by mouth daily. 06/23/15  Yes Shawnee Knapp, MD  Ergocalciferol (VITAMIN D2 PO) Take by mouth.   Yes Historical Provider, MD  feeding supplement (BOOST / RESOURCE BREEZE) LIQD Take 1 Container by mouth 3 (three) times daily between meals. 07/20/15  Yes Shawnee Knapp, MD  Guaifenesin [facility] MAXIMUM STRENGTH) 1200 MG TB12 Take 1 tablet (1,200 mg total) by mouth every 12 (twelve) hours as needed. 07/20/15  Yes Shawnee Knapp, MD  HYDROcodone-acetaminophen (NORCO) 10-325 MG tablet Take 1-2 tablets by mouth every 6 (six) hours as needed for moderate pain. This is a 1 month supply. 09/01/15  Yes Shawnee Knapp, MD  ipratropium-albuterol (DUONEB) 0.5-2.5 (3) MG/3ML SOLN Take 3 mLs by nebulization 4 (four) times daily -  before meals and at bedtime. 06/03/15  Yes Robyn Haber, MD  OLANZapine  (ZYPREXA) 10 MG tablet Take 1 tablet (10 mg total) by mouth at bedtime. 06/23/15  Yes Shawnee Knapp, MD  omeprazole (PRILOSEC) 40 MG capsule Take 40 mg by mouth daily.   Yes Historical Provider, MD  ondansetron (ZOFRAN ODT) 4 MG disintegrating tablet Take 1-2 tablets (4-8 mg total) by mouth every 8 (eight) hours as needed for nausea or vomiting. 07/20/15  Yes Shawnee Knapp, MD  Phenyleph-Promethazine-Cod (PROMETHAZINE VC/CODEINE) 5-6.25-10 MG/5ML SYRP Take 10 mLs by mouth every 4 (four) hours as needed. 09/05/15  Yes Shawnee Knapp, MD  polyethylene glycol powder (GLYCOLAX/MIRALAX) powder Take 17 g by mouth 2 (two) times daily as needed. 04/12/15  Yes Shawnee Knapp, MD  predniSONE (DELTASONE) 20 MG tablet Take 3 tabs po qd x 3d, 2 tabs po qd x 3d, 1 tab po qd x 3d 09/05/15  Yes Shawnee Knapp, MD  UNABLE TO FIND Place 1 application into the nose once. Med Name: best Fit evaluation for oxygen, possibly simply go mini 08/18/15  Yes Mancel Bale, PA-C  UNABLE TO FIND Med Name: Best fit evaluation for oxygen - possible simply go mini 08/18/15  Yes Mancel Bale, PA-C   Social History   Social History  . Marital Status: Divorced    Spouse Name: N/A  . Number of Children: N/A  . Years of Education: N/A   Occupational History  . Not on file.   Social History Main Topics  . Smoking status: Current Every Day Smoker -- 2.00 packs/day for 50 years    Types: Cigarettes  . Smokeless tobacco: Never Used  . Alcohol Use: No  . Drug Use: No  . Sexual Activity: No   Other Topics Concern  . Not on file   Social History Narrative   Review of Systems  Constitutional: Positive for fever, chills, activity change and fatigue. Negative for appetite change.  Respiratory: Positive for cough. Negative for chest tightness.   Gastrointestinal: Positive for nausea and  abdominal pain. Negative for vomiting, diarrhea and constipation.  Endocrine: Positive for cold intolerance.  Musculoskeletal: Positive for back pain, arthralgias  and gait problem. Negative for joint swelling.  Allergic/Immunologic: Positive for environmental allergies.  Hematological: Negative for adenopathy. Bruises/bleeds easily.  Psychiatric/Behavioral: Positive for confusion, sleep disturbance, dysphoric mood and decreased concentration. Negative for agitation. The patient is nervous/anxious.        Objective:   Physical Exam  Constitutional: She is oriented to person, place, and time. She appears well-developed and well-nourished. No distress.  HENT:  Head: Normocephalic and atraumatic.  Eyes: Conjunctivae and EOM are normal.  Neck: Neck supple.  Cardiovascular: Normal rate, regular rhythm, S1 normal, S2 normal and normal heart sounds.   No murmur heard. Pulmonary/Chest: Effort normal.  Excellent air movement. Lungs are clear.  Musculoskeletal: Normal range of motion.  Neurological: She is alert and oriented to person, place, and time.  Skin: Skin is warm and dry.  Psychiatric: She has a normal mood and affect. Her behavior is normal.  Nursing note and vitals reviewed.  Filed Vitals:   09/16/15 0952  BP: 102/64  Pulse: 86  Temp: 98.4 F (36.9 C)  Resp: 18  Height: 5\' 3"  (1.6 m)  Weight: 94 lb 9.6 oz (42.91 kg)  SpO2: 98%    Assessment & Plan:   Pt may need to gradually ween down off of prednisone if having withdrawal symptoms. Pt instructed to call or RTC.    1. COPD exacerbation ([place])   2. Adjustment disorder with mixed anxiety and depressed mood     Meds ordered this encounter  Medications  . HYDROcodone-homatropine (HYCODAN) 5-1.5 MG/5ML syrup    Sig: Take 5 mLs by mouth every 8 (eight) hours as needed for cough.    Dispense:  120 mL    Refill:  0  . ALPRAZolam (XANAX) 1 MG tablet    Sig: Take 1 tablet (1 mg total) by mouth 3 (three) times daily as needed for anxiety. This is a 1 month supply    Dispense:  90 tablet    Refill:  0  . ondansetron (ZOFRAN ODT) 4 MG disintegrating tablet    Sig: Take 1-2 tablets (4-8  mg total) by mouth every 8 (eight) hours as needed for nausea or vomiting.    Dispense:  180 tablet    Refill:  0    I personally performed the services described in this documentation, which was scribed in my presence. The recorded information has been reviewed and considered, and addended by me as needed.  [name], MD MPH

## 2015-09-19 DIAGNOSIS — J449 Chronic obstructive pulmonary disease, unspecified: Secondary | ICD-10-CM | POA: Diagnosis not present

## 2015-09-21 ENCOUNTER — Ambulatory Visit: Payer: Self-pay

## 2015-09-22 ENCOUNTER — Telehealth: Payer: Self-pay | Admitting: Family Medicine

## 2015-09-22 NOTE — Telephone Encounter (Signed)
Patient stated she called yesterday requesting cough medication. I did not see any documentation on the request. Patient request Hydrocodone 5-1.5 mg [phone].

## 2015-09-26 NOTE — Telephone Encounter (Signed)
no

## 2015-09-27 NOTE — Telephone Encounter (Signed)
Pt called again requesting the cough medicine, was told Dr Brigitte Pulse stated she isn't able to do, but pt still want a call back from her. Please call [phone] or [place]

## 2015-09-28 ENCOUNTER — Telehealth: Payer: Self-pay

## 2015-09-28 MED ORDER — BENZONATATE 200 MG PO CAPS: 200.0000 mg | ORAL_CAPSULE | Freq: Three times a day (TID) | ORAL | Status: DC | PRN

## 2015-09-28 MED ORDER — HYDROCODONE-ACETAMINOPHEN 10-325 MG PO TABS: 1.0000 | ORAL_TABLET | Freq: Four times a day (QID) | ORAL | Status: DC | PRN

## 2015-09-28 NOTE — Telephone Encounter (Signed)
LM for pt reiterating that Dr Brigitte Pulse can not RF her narcotic cough med, but if she has other questions to please call me back.

## 2015-09-28 NOTE — Telephone Encounter (Signed)
Duplicate message, called pt, no answer. See other telephone note.

## 2015-09-28 NOTE — Telephone Encounter (Signed)
Called pt, LVM. It is time for a refill on her chronic hydrocodone so she can pick that rx up at her convenience.  I have sent some tessalon pearles in for her cough

## 2015-09-28 NOTE — Telephone Encounter (Signed)
Please reference call from 4/5/. She would like a CB from Dr. Brigitte Pulse. [phone]

## 2015-09-28 NOTE — Telephone Encounter (Signed)
Please see message - wants to talk with you.

## 2015-09-29 DIAGNOSIS — J449 Chronic obstructive pulmonary disease, unspecified: Secondary | ICD-10-CM | POA: Diagnosis not present

## 2015-09-29 NOTE — Telephone Encounter (Signed)
IC pt - advised Tessalon called to [facility] and Hydrocodone rx is at front desk for pick up. Pt verbalized understanding.

## 2015-10-01 ENCOUNTER — Ambulatory Visit (INDEPENDENT_AMBULATORY_CARE_PROVIDER_SITE_OTHER): Payer: Medicare Other | Admitting: Family Medicine

## 2015-10-01 ENCOUNTER — Telehealth: Payer: Self-pay

## 2015-10-01 VITALS — BP 104/62 | HR 75 | Temp 98.4°F | Resp 18 | Ht 63.0 in | Wt 102.0 lb

## 2015-10-01 DIAGNOSIS — R5383 Other fatigue: Secondary | ICD-10-CM | POA: Diagnosis not present

## 2015-10-01 DIAGNOSIS — R636 Underweight: Secondary | ICD-10-CM

## 2015-10-01 DIAGNOSIS — F4323 Adjustment disorder with mixed anxiety and depressed mood: Secondary | ICD-10-CM | POA: Diagnosis not present

## 2015-10-01 DIAGNOSIS — G894 Chronic pain syndrome: Secondary | ICD-10-CM

## 2015-10-01 MED ORDER — BOOST / RESOURCE BREEZE PO LIQD: 1.0000 | Freq: Three times a day (TID) | ORAL | Status: DC

## 2015-10-01 MED ORDER — OLANZAPINE 10 MG PO TABS: 10.0000 mg | ORAL_TABLET | Freq: Every day | ORAL | Status: DC

## 2015-10-01 MED ORDER — DULOXETINE HCL 60 MG PO CPEP: 60.0000 mg | ORAL_CAPSULE | Freq: Every day | ORAL | Status: DC

## 2015-10-01 NOTE — Telephone Encounter (Signed)
Dr Brigitte Pulse   Patient remembered after she left she needs those pills for under her tongue for nausea.   [employer]   [phone] (H)

## 2015-10-01 NOTE — Progress Notes (Addendum)
Subjective:    Patient ID: [name], female    DOB: 23-Dec-1950, 65 y.o.   MRN: [id number] By signing my name below, I, [name], attest that this documentation has been prepared under the direction and in the presence of [name], MD. Electronically Signed: Judithe [name], ER Scribe. 10/01/2015. 11:00 AM.  Chief Complaint  Patient presents with  . Medication Problem    wants to discuss several of her medications    HPI HPI Comments: [name] [name] is a 65 y.o. female who presents to [facility] for medication refills. She states she has been crying a lot a lately, and she is feeling fidgety. She is feeling sad about her mother who has dementia.  Her roommate is haranguing her to quit smoking which she can't do. So would like to try chantix. Does hurt so much she feels that it would be easier to just die but no true SI or plan. Nauseas. On cymbalta - thinks she has been taking her zyprexa qhs but not sure.   Past Medical History  Diagnosis Date  . Peptic ulcer disease     dr Oletta Lamas  . Acute duodenal ulcer with hemorrhage and perforation, with obstruction ([place])   . Barrett's esophagus   . Menopause   . GERD (gastroesophageal reflux disease)   . Asthma   . Chronic abdominal pain     narcotic dependence, dr gyarteng-dak at [facility]  . Narcotic abuse   . Anxiety     sees [name] NP at Dr. Radonna Ricker office  . Fx two ribs-open 08-24-11    left 4th and 5th  . Fx of fibula 07-28-11    left fibula, 2 places   . COPD (chronic obstructive pulmonary disease) ([place])   . Allergy   . Anemia   . History of blood transfusion     "related to OR; maybe when I perforated that ulcer"  . Lap Nissen + truncal vagotomy July 2008 05/13/2013  . LEG EDEMA 01/19/2008    Qualifier: Diagnosis of  By: [name] MD, [name]   . ACUTE DUODEN ULCER W/HEMORR PERF&OBSTRUCTION 06/26/2004    Qualifier: Diagnosis of  By: [name] MD, [name]   . FEVER, RECURRENT 08/11/2009    Qualifier: Diagnosis  of  By: [name] MD, [name] MENOPAUSE 01/07/2007    Qualifier: Diagnosis of  By: [name], CMA, [place]    . Gastroparesis 06/03/2007    Qualifier: Diagnosis of  By: [name] MD, [name]   . GASTRIC OUTLET OBSTRUCTION 08/28/2007    In July 2008 she underwent laparoscopic enterolysis, Nissen fundoplication over a [id number] bougie, single pledgeted suture colosure of the hiatus and a 3 suture wrap.  Lap truncal vagotomy and a loop gastrojejunostomy was performed.  This was revised in August of 2009 to a roux en Y gastrojejujostomy.     . 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 Depression   . History of hiatal hernia   . Complication of anesthesia     pt states had too much xanax on board - agitated   . Anginal pain ([place])     "many many years ago"  . Exertional shortness of breath   . Pneumonia, organism unspecified 11/07/2010    Assoc with R Parapneumonic effusion 09/2010    - Tapped 10/05/10    - CxR resolved 11/07/2010    . Headache     "monthly" (09/06/2014)  . Arthritis     "  hips" (09/06/2014)  . Myocardial infarction ([place])   . S/P jejunostomy Feb 2016 07/26/2014   Allergies  Allergen Reactions  . Lithium Nausea And Vomiting  . Celebrex [Celecoxib] Other (See Comments)    History of bleeding ulcers  . Morphine Itching  . Quetiapine Other (See Comments)     tardive dyskinesia  . Varenicline Tartrate Other (See Comments)    hallucinations   Current Outpatient Prescriptions on File Prior to Visit  Medication Sig Dispense Refill  . ALPRAZolam (XANAX) 1 MG tablet Take 1 tablet (1 mg total) by mouth 3 (three) times daily as needed for anxiety. This is a 1 month supply 90 tablet 0  . budesonide-formoterol (SYMBICORT) 160-4.5 MCG/ACT inhaler Inhale 2 puffs into the lungs 2 (two) times daily. 1 Inhaler 3  . Ergocalciferol (VITAMIN D2 PO) Take by mouth.    . feeding supplement (BOOST / RESOURCE BREEZE) LIQD Take 1 Container by mouth 3 (three) times daily between meals. 90  Container 11  . HYDROcodone-acetaminophen (NORCO) 10-325 MG tablet Take 1-2 tablets by mouth every 6 (six) hours as needed for moderate pain. May dispense on or after 09/30/2015. This is a 1 month supply. 240 tablet 0  . OLANZapine (ZYPREXA) 10 MG tablet Take 1 tablet (10 mg total) by mouth at bedtime. 30 tablet 1  . omeprazole (PRILOSEC) 40 MG capsule Take 40 mg by mouth daily.    . ondansetron (ZOFRAN ODT) 4 MG disintegrating tablet Take 1-2 tablets (4-8 mg total) by mouth every 8 (eight) hours as needed for nausea or vomiting. 180 tablet 0  . UNABLE TO FIND Place 1 application into the nose once. Med Name: best Fit evaluation for oxygen, possibly simply go mini 1 Device 0  . UNABLE TO FIND Med Name: Best fit evaluation for oxygen - possible simply go mini 1 Device 0  . albuterol (PROVENTIL HFA;VENTOLIN HFA) 108 (90 Base) MCG/ACT inhaler Inhale 2 puffs into the lungs every 4 (four) hours as needed (cough, shortness of breath or wheezing.). (Patient not taking: Reported on 10/01/2015) 1 Inhaler 3  . Aspirin-Acetaminophen-Caffeine (GOODY HEADACHE PO) Take 1 packet by mouth 4 (four) times daily as needed (pain). Reported on 10/01/2015    . benzonatate (TESSALON) 200 MG capsule Take 1 capsule (200 mg total) by mouth 3 (three) times daily as needed for cough. (Patient not taking: Reported on 10/01/2015) 30 capsule 0  . Calcium Citrate-Vitamin D (CALCIUM CITRATE CHEWY BITE) 500-500 MG-UNIT CHEW Chew 1 tablet by mouth 2 (two) times daily. (Patient not taking: Reported on 10/01/2015) 180 tablet 3  . DULoxetine (CYMBALTA) 60 MG capsule Take 1 capsule (60 mg total) by mouth daily. (Patient not taking: Reported on 10/01/2015) 30 capsule 5  . Guaifenesin (MUCINEX MAXIMUM STRENGTH) 1200 MG TB12 Take 1 tablet (1,200 mg total) by mouth every 12 (twelve) hours as needed. (Patient not taking: Reported on 10/01/2015) 14 tablet 1  . HYDROcodone-homatropine (HYCODAN) 5-1.5 MG/5ML syrup Take 5 mLs by mouth every 8 (eight) hours as  needed for cough. (Patient not taking: Reported on 10/01/2015) 120 mL 0  . ipratropium-albuterol (DUONEB) 0.5-2.5 (3) MG/3ML SOLN Take 3 mLs by nebulization 4 (four) times daily -  before meals and at bedtime. (Patient not taking: Reported on 10/01/2015) 360 mL 3  . Phenyleph-Promethazine-Cod (PROMETHAZINE VC/CODEINE) 5-6.25-10 MG/5ML SYRP Take 10 mLs by mouth every 4 (four) hours as needed. (Patient not taking: Reported on 10/01/2015) 180 mL 0  . polyethylene glycol powder (GLYCOLAX/MIRALAX) powder Take 17 g by mouth  2 (two) times daily as needed. (Patient not taking: Reported on 10/01/2015) 850 g 11  . predniSONE (DELTASONE) 20 MG tablet Take 3 tabs po qd x 3d, 2 tabs po qd x 3d, 1 tab po qd x 3d (Patient not taking: Reported on 10/01/2015) 18 tablet 0   No current facility-administered medications on file prior to visit.    Review of Systems  Constitutional: Positive for activity change, appetite change, fatigue and unexpected weight change (gain). Negative for fever and chills.  Respiratory: Negative for cough, chest tightness, shortness of breath and wheezing.   Cardiovascular: Negative for chest pain, palpitations and leg swelling.  Gastrointestinal: Positive for nausea and abdominal pain. Negative for vomiting, diarrhea, constipation, blood in stool, abdominal distention and anal bleeding.  Allergic/Immunologic: Negative for immunocompromised state.  Neurological: Negative for weakness and numbness.  Psychiatric/Behavioral: Positive for behavioral problems, confusion, sleep disturbance, dysphoric mood, decreased concentration and agitation. Negative for suicidal ideas, hallucinations and self-injury. The patient is nervous/anxious. The patient is not hyperactive.        Weepy.      Objective:  BP 104/62 mmHg  Pulse 75  Temp(Src) 98.4 F (36.9 C)  Resp 18  Ht 5\' 3"  (1.6 m)  Wt 102 lb (46.267 kg)  BMI 18.07 kg/m2  SpO2 97%  Physical Exam  Constitutional: She is oriented to person, place,  and time. No distress.  Slightly labile. Thin.  HENT:  Head: Normocephalic and atraumatic.  Eyes: Pupils are equal, round, and reactive to light.  Neck: Neck supple.  Cardiovascular: Normal rate.   Pulmonary/Chest: Effort normal. No respiratory distress.  Musculoskeletal: Normal range of motion.  Neurological: She is alert and oriented to person, place, and time. Coordination normal.  Skin: Skin is warm and dry. She is not diaphoretic.  Psychiatric: Her speech is normal. Her mood appears anxious. Her affect is labile. She is agitated. She is not slowed. Cognition and memory are impaired. She expresses impulsivity and inappropriate judgment. She expresses no suicidal ideation. She expresses no suicidal plans. She exhibits abnormal recent memory and abnormal remote memory.  Nursing note and vitals reviewed.     Assessment & Plan:   1. Underweight   2. Adjustment disorder with mixed anxiety and depressed mood   3. Chronic pain syndrome   4. Lethargic   Pt AGAIN encouraged to see therapist which she is still very reluctant to do - fortunately, her roommate [name] is encouraging her to pursue this as well. I am very reluctant to change pt's antidepressant cymbalta as she was doing very well on this several months ago but prior to that her mood sxs were very severe when she was weaning off of paxil and onto the cymbalta.  Pt's pain still uncontrolled as well which also has a huge impact on her moods sxs so I am concerned that her pain would increase and therefore mood sxs worsen if off of cymbalta.  It looks like she has probably not been taking her zyprexa as has not been refilled in 3 mos but it is quite concerning that pt is unable to identify if she has been taking this med qhs or not.  . Billy Fischer - pt agreeable. Needs to stop smoking and asks for chantix but we cannot do this when her mood sxs are so severe, encouraged pt to focus on cutting down for now.  Meds ordered this encounter    Medications  . DULoxetine (CYMBALTA) 60 MG capsule    Sig: Take  1 capsule (60 mg total) by mouth daily.    Dispense:  30 capsule    Refill:  5  . OLANZapine (ZYPREXA) 10 MG tablet    Sig: Take 1 tablet (10 mg total) by mouth at bedtime.    Dispense:  30 tablet    Refill:  1    Failed risperdal, cannot tolerate seroquel due to tardive dyskinesia  . feeding supplement (BOOST / RESOURCE BREEZE) LIQD    Sig: Take 1 Container by mouth 3 (three) times daily between meals.    Dispense:  90 Container    Refill:  Vivian to substitute a similar product if cost for pt is better and change is acceptable to pt    I personally performed the services described in this documentation, which was scribed in my presence. The recorded information has been reviewed and considered, and addended by me as needed.  [name], MD MPH

## 2015-10-01 NOTE — Patient Instructions (Addendum)
IF you received an x-ray today, you will receive an invoice from [facility]. Please contact [facility] at [phone] with questions or concerns regarding your invoice.   IF you received labwork today, you will receive an invoice from [employer]. Please contact [facility] at [phone] with questions or concerns regarding your invoice.   Our billing staff will not be able to assist you with questions regarding bills from these companies.  You will be contacted with the lab results as soon as they are available. The fastest way to get your results is to activate your My Chart account. Instructions are located on the last page of this paperwork. If you have not heard from [place] regarding the results in 2 weeks, please contact this office.    Consider trying a lavender extract supplement.  Complicated Grieving Grief is a normal response to the death of someone close to you. Feelings of fear, anger, and guilt can affect almost everyone who loses a loved one. It is also common to have symptoms of depression while you are grieving. These include problems with sleep, loss of appetite, and lack of energy. They may last for weeks or months after a loss. Complicated grief is different from normal grief or depression. Normal grieving involves sadness and feelings of loss, but these feelings are not constant. Complicated grief is a constant and severe type of grief. It interferes with your ability to function normally. It may last for several months to a year or longer. Complicated grief may require treatment from a mental health care provider. CAUSES  It is not known why some people continue to struggle with grief and others do not. You may be at higher risk for complicated grief if:  The death of your loved one was sudden or unexpected.  The death of your loved one was due to a violent event.  Your loved one committed suicide.  Your loved one was a child  or a young person.  You were very close to or dependent on the loved one.  You have a history of depression. SIGNS AND SYMPTOMS Signs and symptoms of complicated grief may include:  Feeling disbelief or numbness.  Being unable to enjoy good memories of your loved one.  Needing to avoid anything that reminds you of your loved one.  Being unable to stop thinking about the death.  Feeling intense anger or guilt.  Feeling alone and hopeless.  Feeling that your life is meaningless and empty.  Losing the desire to live. DIAGNOSIS Your health care provider may diagnose complicated grief if:  You have constant symptoms of grief for 6-12 months or longer.  Your symptoms are interfering with your ability to live your life. Your health care provider may want you to see a mental health care provider. Many symptoms of depression are similar to the symptoms of complicated grief. It is important to be evaluated for complicated grief along with other mental health conditions. TREATMENT  Talk therapy with a mental health provider is the most common treatment for complicated grief. During therapy, you will learn healthy ways to cope with the loss of your loved one. In some cases, your mental health care provider may also recommend antidepressant medicines. HOME CARE INSTRUCTIONS  Take care of yourself.  Eat regular meals and maintain a healthy diet. Eat plenty of fruits, vegetables, and whole grains.  Try to get some exercise each day.  Keep regular hours for sleep. Try to get at least 8 hours  of sleep each night.  Do not use drugs or alcohol to ease your symptoms.  Take medicines only as directed by your health care provider.  Spend time with friends and loved ones.  Consider joining a grief (bereavement) support group to help you deal with your loss.  Keep all follow-up visits as directed by your health care provider. This is important. SEEK MEDICAL CARE IF:  Your symptoms keep  you from functioning normally.  Your symptoms do not get better with treatment. SEEK IMMEDIATE MEDICAL CARE IF:  You have serious thoughts of hurting yourself or someone else.  You have suicidal feelings.   This information is not intended to replace advice given to you by your health care provider. Make sure you discuss any questions you have with your health care provider.   Document Released: 06/10/2005 Document Revised: 03/01/2015 Document Reviewed: 11/18/2013 Elsevier Interactive Patient Education [employer].

## 2015-10-02 MED ORDER — ONDANSETRON 4 MG PO TBDP: 4.0000 mg | ORAL_TABLET | Freq: Three times a day (TID) | ORAL | Status: DC | PRN

## 2015-10-02 NOTE — Telephone Encounter (Signed)
You can see that I sent in #[phone] zofran to [facility] 2 wks ago. Resent rx to [facility]

## 2015-10-06 ENCOUNTER — Ambulatory Visit (INDEPENDENT_AMBULATORY_CARE_PROVIDER_SITE_OTHER): Payer: Medicare Other | Admitting: Family Medicine

## 2015-10-06 VITALS — BP 116/62 | HR 74 | Temp 98.1°F | Resp 18 | Ht 63.0 in | Wt 104.0 lb

## 2015-10-06 DIAGNOSIS — F4323 Adjustment disorder with mixed anxiety and depressed mood: Secondary | ICD-10-CM | POA: Diagnosis not present

## 2015-10-06 DIAGNOSIS — J441 Chronic obstructive pulmonary disease with (acute) exacerbation: Secondary | ICD-10-CM

## 2015-10-06 DIAGNOSIS — R636 Underweight: Secondary | ICD-10-CM

## 2015-10-06 DIAGNOSIS — G894 Chronic pain syndrome: Secondary | ICD-10-CM | POA: Diagnosis not present

## 2015-10-06 MED ORDER — OLANZAPINE 7.5 MG PO TABS: 7.5000 mg | ORAL_TABLET | Freq: Every day | ORAL | Status: DC

## 2015-10-06 MED ORDER — DULOXETINE HCL 30 MG PO CPEP: 30.0000 mg | ORAL_CAPSULE | Freq: Every day | ORAL | Status: DC

## 2015-10-06 MED ORDER — AMOXICILLIN-POT CLAVULANATE 875-125 MG PO TABS: 1.0000 | ORAL_TABLET | Freq: Two times a day (BID) | ORAL | Status: DC

## 2015-10-06 MED ORDER — PREDNISONE 20 MG PO TABS: ORAL_TABLET | ORAL | Status: DC

## 2015-10-06 MED ORDER — HYDROMORPHONE HCL 2 MG PO TABS
2.0000 mg | ORAL_TABLET | Freq: Four times a day (QID) | ORAL | Status: DC | PRN
Start: 2015-10-06 — End: 2015-10-15

## 2015-10-06 MED ORDER — ALPRAZOLAM ER 2 MG PO TB24: 2.0000 mg | ORAL_TABLET | ORAL | Status: DC

## 2015-10-06 MED ORDER — DULOXETINE HCL 40 MG PO CPEP
40.0000 mg | ORAL_CAPSULE | Freq: Every day | ORAL | Status: DC
Start: 2015-10-06 — End: 2015-10-06

## 2015-10-06 MED ORDER — BUDESONIDE-FORMOTEROL FUMARATE 160-4.5 MCG/ACT IN AERO: 2.0000 | INHALATION_SPRAY | Freq: Two times a day (BID) | RESPIRATORY_TRACT | Status: DC

## 2015-10-06 NOTE — Patient Instructions (Addendum)
IF you received an x-ray today, you will receive an invoice from [facility]. Please contact [facility] at [phone] with questions or concerns regarding your invoice.   IF you received labwork today, you will receive an invoice from [employer]. Please contact [facility] at [phone] with questions or concerns regarding your invoice.   Our billing staff will not be able to assist you with questions regarding bills from these companies.  You will be contacted with the lab results as soon as they are available. The fastest way to get your results is to activate your My Chart account. Instructions are located on the last page of this paperwork. If you have not heard from [place] regarding the results in 2 weeks, please contact this office.    Chronic Obstructive Pulmonary Disease Exacerbation Chronic obstructive pulmonary disease (COPD) is a common lung condition in which airflow from the lungs is limited. COPD is a general term that can be used to describe many different lung problems that limit airflow, including chronic bronchitis and emphysema. COPD exacerbations are episodes when breathing symptoms become much worse and require extra treatment. Without treatment, COPD exacerbations can be life threatening, and frequent COPD exacerbations can cause further damage to your lungs. CAUSES  Respiratory infections.  Exposure to smoke.  Exposure to air pollution, chemical fumes, or dust. Sometimes there is no apparent cause or trigger. RISK FACTORS  Smoking cigarettes.  Older age.  Frequent prior COPD exacerbations. SIGNS AND SYMPTOMS  Increased coughing.  Increased thick spit (sputum) production.  Increased wheezing.  Increased shortness of breath.  Rapid breathing.  Chest tightness. DIAGNOSIS Your medical history, a physical exam, and tests will help your health care provider make a diagnosis. Tests may include:  A chest  X-ray.  Basic lab tests.  Sputum testing.  An arterial blood gas test. TREATMENT Depending on the severity of your COPD exacerbation, you may need to be admitted to a hospital for treatment. Some of the treatments commonly used to treat COPD exacerbations are:   Antibiotic medicines.  Bronchodilators. These are drugs that expand the air passages. They may be given with an inhaler or nebulizer. Spacer devices may be needed to help improve drug delivery.  Corticosteroid medicines.  Supplemental oxygen therapy.  Airway clearing techniques, such as noninvasive ventilation (NIV) and positive expiratory pressure (PEP). These provide respiratory support through a mask or other noninvasive device. HOME CARE INSTRUCTIONS  Do not smoke. Quitting smoking is very important to prevent COPD from getting worse and exacerbations from happening as often.  Avoid exposure to all substances that irritate the airway, especially to tobacco smoke.  If you were prescribed an antibiotic medicine, finish it all even if you start to feel better.  Take all medicines as directed by your health care provider.It is important to use correct technique with inhaled medicines.  Drink enough fluids to keep your urine clear or pale yellow (unless you have a medical condition that requires fluid restriction).  Use a cool mist vaporizer. This makes it easier to clear your chest when you cough.  If you have a home nebulizer and oxygen, continue to use them as directed.  Maintain all necessary vaccinations to prevent infections.  Exercise regularly.  Eat a healthy diet.  Keep all follow-up appointments as directed by your health care provider. SEEK IMMEDIATE MEDICAL CARE IF:  You have worsening shortness of breath.  You have trouble talking.  You have severe chest pain.  You have blood in  your sputum.  You have a fever.  You have weakness, vomit repeatedly, or faint.  You feel confused.  You  continue to get worse. MAKE SURE YOU:  Understand these instructions.  Will watch your condition.  Will get help right away if you are not doing well or get worse.   This information is not intended to replace advice given to you by your health care provider. Make sure you discuss any questions you have with your health care provider.   Document Released: 04/07/2007 Document Revised: 07/01/2014 Document Reviewed: 02/12/2013 Elsevier Interactive Patient Education [employer].

## 2015-10-06 NOTE — Progress Notes (Signed)
By signing my name below I, [name], attest that this documentation has been prepared under the direction and in the presence of [name], MD. Electonically Signed. [name], Scribe 10/06/2015 at 9:49 AM  Subjective:    Patient ID: [name], female    DOB: 08/25/1950, 65 y.o.   MRN: [id number] Chief Complaint  Patient presents with  . Chills    fever 101.5 last night  . Generalized Body Aches  . Cough    HPI [name] is a 65 y.o. female who presents to the [facility] complaining of fever, body aches, and cough. Pt states cough is mostly dry. Pt states that her symptoms have been worsening for the past week.  Fever was measured at 101.5 last night. Pt also states she feels mildly more SOB than usual.  Pt states she has been using her home O2.   Pt has been returning to [facility] multiple times for treatment for upper respiratory symptoms since January 2017.   Pt also reports that she has been having clear nasal congestion. Pt denies sinus pressure.   Pt states that she took prednisone last month and that it did not make her feel anxious.   Pt states she has recently reduced the amount of tobacco she has been smoking to less than a pack a day.   Pt also reports losing all her medications last night.   Past Medical History  Diagnosis Date  . Peptic ulcer disease     dr Oletta Lamas  . Acute duodenal ulcer with hemorrhage and perforation, with obstruction ([place])   . Barrett's esophagus   . Menopause   . GERD (gastroesophageal reflux disease)   . Asthma   . Chronic abdominal pain     narcotic dependence, dr gyarteng-dak at [facility]  . Narcotic abuse   . Anxiety     sees [name] Ripple NP at Dr. Radonna Ricker office  . Fx two ribs-open 08-24-11    left 4th and 5th  . Fx of fibula 07-28-11    left fibula, 2 places   . COPD (chronic obstructive pulmonary disease) ([place])   . Allergy   . Anemia   . History of blood transfusion     "related to OR;  maybe when I perforated that ulcer"  . Lap Nissen + truncal vagotomy July 2008 05/13/2013  . LEG EDEMA 01/19/2008    Qualifier: Diagnosis of  By: [name] MD, [name]   . ACUTE DUODEN ULCER W/HEMORR PERF&OBSTRUCTION 06/26/2004    Qualifier: Diagnosis of  By: [name] MD, [name]   . FEVER, RECURRENT 08/11/2009    Qualifier: Diagnosis of  By: [name] MD, [name] MENOPAUSE 01/07/2007    Qualifier: Diagnosis of  By: [name], CMA, [place]    . Gastroparesis 06/03/2007    Qualifier: Diagnosis of  By: [name] MD, [name]   . GASTRIC OUTLET OBSTRUCTION 08/28/2007    In July 2008 she underwent laparoscopic enterolysis, Nissen fundoplication over a [id number] bougie, single pledgeted suture colosure of the hiatus and a 3 suture wrap.  Lap truncal vagotomy and a loop gastrojejunostomy was performed.  This was revised in August of 2009 to a roux en Y gastrojejujostomy.     . 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 Depression   . History of hiatal hernia   . Complication of anesthesia     pt states had too much xanax on board - agitated   .  Anginal pain ([place])     "many many years ago"  . Exertional shortness of breath   . Pneumonia, organism unspecified 11/07/2010    Assoc with R Parapneumonic effusion 09/2010    - Tapped 10/05/10    - CxR resolved 11/07/2010    . Headache     "monthly" (09/06/2014)  . Arthritis     "hips" (09/06/2014)  . Myocardial infarction ([place])   . S/P jejunostomy Feb 2016 07/26/2014    Current outpatient prescriptions:  .  albuterol (PROVENTIL HFA;VENTOLIN HFA) 108 (90 Base) MCG/ACT inhaler, Inhale 2 puffs into the lungs every 4 (four) hours as needed (cough, shortness of breath or wheezing.)., Disp: 1 Inhaler, Rfl: 3 .  ALPRAZolam (XANAX) 1 MG tablet, Take 1 tablet (1 mg total) by mouth 3 (three) times daily as needed for anxiety. This is a 1 month supply, Disp: 90 tablet, Rfl: 0 .  Aspirin-Acetaminophen-Caffeine (GOODY HEADACHE PO), Take 1 packet by mouth 4 (four) times  daily as needed (pain). Reported on 10/01/2015, Disp: , Rfl:  .  budesonide-formoterol (SYMBICORT) 160-4.5 MCG/ACT inhaler, Inhale 2 puffs into the lungs 2 (two) times daily., Disp: 1 Inhaler, Rfl: 3 .  DULoxetine (CYMBALTA) 60 MG capsule, Take 1 capsule (60 mg total) by mouth daily., Disp: 30 capsule, Rfl: 5 .  Ergocalciferol (VITAMIN D2 PO), Take by mouth., Disp: , Rfl:  .  feeding supplement (BOOST / RESOURCE BREEZE) LIQD, Take 1 Container by mouth 3 (three) times daily between meals., Disp: 90 Container, Rfl: 11 .  HYDROcodone-acetaminophen (NORCO) 10-325 MG tablet, Take 1-2 tablets by mouth every 6 (six) hours as needed for moderate pain. May dispense on or after 09/30/2015. This is a 1 month supply., Disp: 240 tablet, Rfl: 0 .  ipratropium-albuterol (DUONEB) 0.5-2.5 (3) MG/3ML SOLN, Take 3 mLs by nebulization 4 (four) times daily -  before meals and at bedtime., Disp: 360 mL, Rfl: 3 .  OLANZapine (ZYPREXA) 10 MG tablet, Take 1 tablet (10 mg total) by mouth at bedtime., Disp: 30 tablet, Rfl: 1 .  omeprazole (PRILOSEC) 40 MG capsule, Take 40 mg by mouth daily., Disp: , Rfl:  .  ondansetron (ZOFRAN ODT) 4 MG disintegrating tablet, Take 1-2 tablets (4-8 mg total) by mouth every 8 (eight) hours as needed for nausea or vomiting., Disp: 180 tablet, Rfl: 0 .  Calcium Citrate-Vitamin D (CALCIUM CITRATE CHEWY BITE) 500-500 MG-UNIT CHEW, Chew 1 tablet by mouth 2 (two) times daily. (Patient not taking: Reported on 10/01/2015), Disp: 180 tablet, Rfl: 3 .  Guaifenesin (MUCINEX MAXIMUM STRENGTH) 1200 MG TB12, Take 1 tablet (1,200 mg total) by mouth every 12 (twelve) hours as needed. (Patient not taking: Reported on 10/01/2015), Disp: 14 tablet, Rfl: 1 .  UNABLE TO FIND, Place 1 application into the nose once. Med Name: best Fit evaluation for oxygen, possibly simply go mini, Disp: 1 Device, Rfl: 0 .  UNABLE TO FIND, Med Name: Best fit evaluation for oxygen - possible simply go mini, Disp: 1 Device, Rfl: 0  Allergies    Allergen Reactions  . Lithium Nausea And Vomiting  . Celebrex [Celecoxib] Other (See Comments)    History of bleeding ulcers  . Morphine Itching  . Quetiapine Other (See Comments)     tardive dyskinesia  . Varenicline Tartrate Other (See Comments)    hallucinations   Depression screen [facility] 2/9 10/06/2015 09/05/2015 08/23/2015 08/07/2015 07/30/2015  Decreased Interest 0 0 0 0 0  Down, Depressed, Hopeless 3 0 0 0 0  PHQ -  2 Score 3 0 0 0 0  Altered sleeping 3 - - - -  Tired, decreased energy 1 - - - -  Change in appetite 1 - - - -  Feeling bad or failure about yourself  0 - - - -  Trouble concentrating 3 - - - -  Moving slowly or fidgety/restless 0 - - - -  Suicidal thoughts 0 - - - -  PHQ-9 Score 11 - - - -  Difficult doing work/chores Not difficult at all - - - -       Review of Systems  Constitutional: Positive for fever and chills.  HENT: Positive for congestion.   Eyes: Negative for photophobia.  Respiratory: Positive for cough and shortness of breath.   Cardiovascular: Negative for chest pain.  Genitourinary: Negative for dysuria.  Musculoskeletal: Negative for back pain.  Skin: Negative for rash.  Neurological: Negative for headaches.  Psychiatric/Behavioral: Positive for sleep disturbance. Negative for agitation.       Objective:  BP 116/62 mmHg  Pulse 74  Temp(Src) 98.1 F (36.7 C) (Oral)  Resp 18  Ht 5\' 3"  (1.6 m)  Wt 104 lb (47.174 kg)  BMI 18.43 kg/m2  SpO2 93%  Physical Exam  Constitutional: She is oriented to person, place, and time. She appears well-developed and well-nourished. No distress.  HENT:  Head: Normocephalic and atraumatic.  Left Ear: Tympanic membrane normal.  Nose: Nose normal.  Mouth/Throat: Oropharynx is clear and moist. No posterior oropharyngeal erythema.  Rt ear canal has serum.   Eyes: Conjunctivae are normal. Pupils are equal, round, and reactive to light.  Neck: Neck supple.  Cardiovascular: Normal rate and regular rhythm.   Exam reveals no gallop and no friction rub.   No murmur heard. Pulmonary/Chest: Effort normal. She has decreased breath sounds. She has no wheezes. She has no rhonchi. She has no rales.  Musculoskeletal: Normal range of motion.  Lymphadenopathy:       Head (right side): Submandibular (rt worse than left) adenopathy present.       Head (left side): Submandibular adenopathy present.  Neurological: She is alert and oriented to person, place, and time. Gait normal.  Skin: Skin is warm and dry.  Psychiatric: She has a normal mood and affect. Her behavior is normal.  Nursing note and vitals reviewed.         Assessment & Plan:  Need to change pt's psychiatric medication dosages of Cymbalta and zyprexa to ensure that the pt's insurance will cover the early refill. 1. COPD exacerbation ([place])   2. Chronic pain syndrome   3. Underweight - continuing to slowly gain, cont freq meals and prot supp drinks.  4. Adjustment disorder with mixed anxiety and depressed mood - try changing to XR alprazolam qd - pt will call in 1-2 wks to let me know if she wants to cont or go back to IR.  Avoid freq or high dose nsaid use 2/2 chronic abd pain. Minimal additional APAP due to the 2600mg  she gets daily from her chronic hydrodocone. Again warned of pt being at high risk for unintentional medi Developing macular degeneration - needs to quite smoking - may consider changing to vapor or e-cig trial again then wean down.  Meds ordered this encounter  Medications  . OLANZapine (ZYPREXA) 7.5 MG tablet    Sig: Take 1 tablet (7.5 mg total) by mouth at bedtime.    Dispense:  30 tablet    Refill:  0    Failed risperdal,  cannot tolerate seroquel due to tardive dyskinesia  . DISCONTD: DULoxetine 40 MG CPEP    Sig: Take 40-80 mg by mouth daily.    Dispense:  60 capsule    Refill:  0  . budesonide-formoterol (SYMBICORT) 160-4.5 MCG/ACT inhaler    Sig: Inhale 2 puffs into the lungs 2 (two) times daily.    Dispense:   1 Inhaler    Refill:  3  . predniSONE (DELTASONE) 20 MG tablet    Sig: Take 3 tabs po qd x 3d, 2 tabs po qd x 3d, 1 tab po qd x 3d    Dispense:  18 tablet    Refill:  0  . amoxicillin-clavulanate (AUGMENTIN) 875-125 MG tablet    Sig: Take 1 tablet by mouth 2 (two) times daily.    Dispense:  20 tablet    Refill:  0  . HYDROmorphone (DILAUDID) 2 MG tablet    Sig: Take 1 tablet (2 mg total) by mouth every 6 (six) hours as needed for severe pain. This is a 3 week supply. No refill on pain medicines until 10/28/15    Dispense:  70 tablet    Refill:  0  . ALPRAZolam (XANAX XR) 2 MG 24 hr tablet    Sig: Take 1 tablet (2 mg total) by mouth every morning.    Dispense:  15 tablet    Refill:  0  . DULoxetine (CYMBALTA) 30 MG capsule    Sig: Take 1-2 capsules (30-60 mg total) by mouth daily.    Dispense:  60 capsule    Refill:  3    I personally performed the services described in this documentation, which was scribed in my presence. The recorded information has been reviewed and considered, and addended by me as needed.  [name], MD MPH

## 2015-10-07 ENCOUNTER — Other Ambulatory Visit: Payer: Self-pay | Admitting: Family Medicine

## 2015-10-10 NOTE — Telephone Encounter (Signed)
Dr Brigitte Pulse, you just saw pt recently but don't see this med discussed. OK to give RFs?

## 2015-10-15 ENCOUNTER — Ambulatory Visit (INDEPENDENT_AMBULATORY_CARE_PROVIDER_SITE_OTHER): Payer: Medicare Other | Admitting: Family Medicine

## 2015-10-15 VITALS — BP 142/82 | HR 68 | Temp 98.2°F | Resp 16 | Ht 63.0 in | Wt 104.0 lb

## 2015-10-15 DIAGNOSIS — F4323 Adjustment disorder with mixed anxiety and depressed mood: Secondary | ICD-10-CM | POA: Diagnosis not present

## 2015-10-15 DIAGNOSIS — R5383 Other fatigue: Secondary | ICD-10-CM

## 2015-10-15 DIAGNOSIS — R109 Unspecified abdominal pain: Secondary | ICD-10-CM | POA: Diagnosis not present

## 2015-10-15 DIAGNOSIS — R0602 Shortness of breath: Secondary | ICD-10-CM

## 2015-10-15 DIAGNOSIS — R110 Nausea: Secondary | ICD-10-CM

## 2015-10-15 MED ORDER — GUAIFENESIN-CODEINE 100-10 MG/5ML PO SOLN: 10.0000 mL | ORAL | Status: DC | PRN

## 2015-10-15 MED ORDER — DULOXETINE HCL 60 MG PO CPEP: 60.0000 mg | ORAL_CAPSULE | Freq: Every day | ORAL | Status: DC

## 2015-10-15 MED ORDER — ALPRAZOLAM 1 MG PO TABS: 1.0000 mg | ORAL_TABLET | Freq: Three times a day (TID) | ORAL | Status: DC | PRN

## 2015-10-15 NOTE — Progress Notes (Signed)
Subjective:    Patient ID: [name], female    DOB: Nov 21, 1950, 65 y.o.   MRN: [id number] Chief Complaint  Patient presents with  . Cough    URI/ x 1wk  . Sinusitis    x1 wk    HPI  Pt reports she doesn't feel any stronger.  She is complaining that the bottle of cough syrup is to small and it was gone after 2 "swigs" and later refers to them "gulps." She still has 1 d of the antibiotic left. When she was on the prednisone she did not feel any sig better. Yesterday she reports her roommate gave her a "couple of hydrocodones" and she slept great.  She is coughing with chest congestion but unable to get anything up.  Feels like she is keeping a fever but no chills.  Then last night she couldn't sleep and didn't have anything to take.  She is using her oxygen at home and has not been needing her nebulizer as no ShoB, DOE, or chest tightness/wheezing.  Feels like she has a bug - everything feels bad  She went and bought a bunch of jewelry but it didn't make her feel any better.  Her mom is at [facility] and she was going to move her as the price was half and it was really nice but at [facility] they are going to match the price.  She has switched to an electronic cigarette but still smoking some additionally on the sake  Past Medical History  Diagnosis Date  . Peptic ulcer disease     dr Oletta Lamas  . Acute duodenal ulcer with hemorrhage and perforation, with obstruction ([place])   . Barrett's esophagus   . Menopause   . GERD (gastroesophageal reflux disease)   . Asthma   . Chronic abdominal pain     narcotic dependence, dr gyarteng-dak at [facility]  . Narcotic abuse   . Anxiety     sees [name] NP at Dr. Radonna Ricker office  . Fx two ribs-open 08-24-11    left 4th and 5th  . Fx of fibula 07-28-11    left fibula, 2 places   . COPD (chronic obstructive pulmonary disease) ([place])   . Allergy   . Anemia   . History of blood transfusion     "related to OR; maybe when I  perforated that ulcer"  . Lap Nissen + truncal vagotomy July 2008 05/13/2013  . LEG EDEMA 01/19/2008    Qualifier: Diagnosis of  By: [name] MD, [name]   . ACUTE DUODEN ULCER W/HEMORR PERF&OBSTRUCTION 06/26/2004    Qualifier: Diagnosis of  By: [name] MD, [name]   . FEVER, RECURRENT 08/11/2009    Qualifier: Diagnosis of  By: [name] MD, [name] MENOPAUSE 01/07/2007    Qualifier: Diagnosis of  By: [name], CMA, [place]    . Gastroparesis 06/03/2007    Qualifier: Diagnosis of  By: [name] MD, [name]   . GASTRIC OUTLET OBSTRUCTION 08/28/2007    In July 2008 she underwent laparoscopic enterolysis, Nissen fundoplication over a [id number] bougie, single pledgeted suture colosure of the hiatus and a 3 suture wrap.  Lap truncal vagotomy and a loop gastrojejunostomy was performed.  This was revised in August of 2009 to a roux en Y gastrojejujostomy.     . 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 Depression   . History of hiatal hernia   . Complication of anesthesia  pt states had too much xanax on board - agitated   . Anginal pain ([place])     "many many years ago"  . Exertional shortness of breath   . Pneumonia, organism unspecified 11/07/2010    Assoc with R Parapneumonic effusion 09/2010    - Tapped 10/05/10    - CxR resolved 11/07/2010    . Headache     "monthly" (09/06/2014)  . Arthritis     "hips" (09/06/2014)  . Myocardial infarction ([place])   . S/P jejunostomy Feb 2016 07/26/2014   Past Surgical History  Procedure Laterality Date  . Esophagogastroduodenoscopy  12-29-09    dr qadeer at [facility], several gastric ulcers  . Laparoscopic nissen fundoplication    . Gastrojejunostomy  02-20-08    Roux en Y gastrojejunsotomy w 1 foot Roux limb  . Repair of perforated ulcer    . Biopsy thyroid  08-13-11    benign nodule, per Dr. Melida Quitter   . Esophagogastroduodenoscopy N/A 09/25/2012    Procedure: ESOPHAGOGASTRODUODENOSCOPY (EGD);  Surgeon: Beryle Beams, MD;  Location: [facility];   Service: Endoscopy;  Laterality: N/A;  . Fracture surgery    . Tonsillectomy    . Cataract extraction w/ intraocular lens  implant, bilateral Bilateral   . Laparoscopic partial gastrectomy N/A 06/29/2013    Procedure: Gastrectomy and GJ Tube placement;  Surgeon: Pedro Earls, MD;  Location: [facility];  Service: General;  Laterality: N/A;  . Colonoscopy    . Gastrostomy N/A 07/26/2014    Procedure: LAPRASCOPIC ASSISTED OPEN PLACEMENT OF JEJUNOSTOMY TUBE;  Surgeon: Pedro Earls, MD;  Location: [facility];  Service: General;  Laterality: N/A;  . Orif hip fracture Bilateral     "pins in both of them"  . Dilation and curettage of uterus    . Hernia repair      "hiatal"  . Joint replacement     Current Outpatient Prescriptions on File Prior to Visit  Medication Sig Dispense Refill  . albuterol (PROVENTIL HFA;VENTOLIN HFA) 108 (90 Base) MCG/ACT inhaler Inhale 2 puffs into the lungs every 4 (four) hours as needed (cough, shortness of breath or wheezing.). 1 Inhaler 3  . Aspirin-Acetaminophen-Caffeine (GOODY HEADACHE PO) Take 1 packet by mouth 4 (four) times daily as needed (pain). Reported on 10/01/2015    . budesonide-formoterol (SYMBICORT) 160-4.5 MCG/ACT inhaler Inhale 2 puffs into the lungs 2 (two) times daily. 1 Inhaler 3  . Calcium Citrate-Vitamin D (CALCIUM CITRATE CHEWY BITE) 500-500 MG-UNIT CHEW Chew 1 tablet by mouth 2 (two) times daily. 180 tablet 3  . Ergocalciferol (VITAMIN D2 PO) Take by mouth.    . feeding supplement (BOOST / RESOURCE BREEZE) LIQD Take 1 Container by mouth 3 (three) times daily between meals. 90 Container 11  . HYDROcodone-acetaminophen (NORCO) 10-325 MG tablet Take 1-2 tablets by mouth every 6 (six) hours as needed for moderate pain. May dispense on or after 09/30/2015. This is a 1 month supply. 240 tablet 0  . ipratropium-albuterol (DUONEB) 0.5-2.5 (3) MG/3ML SOLN Take 3 mLs by nebulization 4 (four) times daily -  before meals and at bedtime. 360 mL 3  . OLANZapine  (ZYPREXA) 7.5 MG tablet Take 1 tablet (7.5 mg total) by mouth at bedtime. 30 tablet 0  . omeprazole (PRILOSEC) 40 MG capsule TAKE ONE CAPSULE BY MOUTH TWICE DAILY 180 capsule 3  . ondansetron (ZOFRAN ODT) 4 MG disintegrating tablet Take 1-2 tablets (4-8 mg total) by mouth every 8 (eight) hours as needed for nausea or vomiting.  180 tablet 0  . UNABLE TO FIND Place 1 application into the nose once. Med Name: best Fit evaluation for oxygen, possibly simply go mini 1 Device 0  . UNABLE TO FIND Med Name: Best fit evaluation for oxygen - possible simply go mini 1 Device 0   No current facility-administered medications on file prior to visit.   Allergies  Allergen Reactions  . Lithium Nausea And Vomiting  . Celebrex [Celecoxib] Other (See Comments)    History of bleeding ulcers  . Morphine Itching  . Quetiapine Other (See Comments)     tardive dyskinesia  . Varenicline Tartrate Other (See Comments)    hallucinations   Family History  Problem Relation Age of Onset  . Cancer Mother     kidney, magliant tumor on face  . Stroke Mother   . Celiac disease Father   . Cancer Father     kidney and pancreatic   Social History   Social History  . Marital Status: Divorced    Spouse Name: N/A  . Number of Children: N/A  . Years of Education: N/A   Social History Main Topics  . Smoking status: Current Every Day Smoker -- 2.00 packs/day for 50 years    Types: Cigarettes  . Smokeless tobacco: Never Used  . Alcohol Use: No  . Drug Use: No  . Sexual Activity: No   Other Topics Concern  . None   Social History Narrative     Review of Systems  Constitutional: Positive for fatigue. Negative for fever, chills, diaphoresis, activity change and appetite change.  HENT: Negative for congestion, rhinorrhea and sore throat.   Respiratory: Positive for cough, chest tightness and shortness of breath. Negative for wheezing.   Cardiovascular: Negative for chest pain, palpitations and leg swelling.    Gastrointestinal: Positive for nausea, abdominal pain and constipation. Negative for vomiting, diarrhea, blood in stool and abdominal distention.  Musculoskeletal: Positive for myalgias, back pain, arthralgias and gait problem. Negative for joint swelling.  Neurological: Positive for weakness.  Hematological: Negative for adenopathy. Bruises/bleeds easily.  Psychiatric/Behavioral: Positive for suicidal ideas, behavioral problems, confusion, sleep disturbance, dysphoric mood, decreased concentration and agitation. Negative for hallucinations. The patient is nervous/anxious. The patient is not hyperactive.        Objective:  BP 142/82 mmHg  Pulse 68  Temp(Src) 98.2 F (36.8 C) (Oral)  Resp 16  Ht 5\' 3"  (1.6 m)  Wt 104 lb (47.174 kg)  BMI 18.43 kg/m2  SpO2 98%  Physical Exam  Constitutional: She is oriented to person, place, and time. She appears cachectic. She does not appear ill. No distress.  HENT:  Head: Normocephalic and atraumatic.  Right Ear: External ear normal.  Left Ear: External ear normal.  Eyes: Conjunctivae are normal. No scleral icterus.  Neck: Normal range of motion. Neck supple. No thyromegaly present.  Cardiovascular: Normal rate, regular rhythm, normal heart sounds and intact distal pulses.   Pulmonary/Chest: Effort normal. No respiratory distress. She has decreased breath sounds.  Musculoskeletal: She exhibits no edema.  Lymphadenopathy:    She has no cervical adenopathy.  Neurological: She is alert and oriented to person, place, and time.  Skin: Skin is warm and dry. She is not diaphoretic. No erythema.  Psychiatric: Her speech is normal and behavior is normal. Cognition and memory are impaired. She expresses impulsivity. She exhibits a depressed mood. She exhibits abnormal recent memory and abnormal remote memory.   Assessment & Plan:   1. Adjustment disorder with mixed anxiety and  depressed mood     Meds ordered this encounter  Medications  . DULoxetine  (CYMBALTA) 60 MG capsule    Sig: Take 1 capsule (60 mg total) by mouth daily.    Dispense:  30 capsule    Refill:  1  . ALPRAZolam (XANAX) 1 MG tablet    Sig: Take 1 tablet (1 mg total) by mouth 3 (three) times daily as needed for anxiety. This is a 1 month supply    Dispense:  90 tablet    Refill:  0  . DISCONTD: guaiFENesin-codeine 100-10 MG/5ML syrup    Sig: Take 10 mLs by mouth every 4 (four) hours as needed for cough.    Dispense:  240 mL    Refill:  0  . guaiFENesin-codeine 100-10 MG/5ML syrup    Sig: Take 10 mLs by mouth every 4 (four) hours as needed for cough.    Dispense:  240 mL    Refill:  0   Today I have utilized the [place] Controlled Substance Registry's online query to confirm compliance regarding the patient's narcotic pain medications. My review reveals that [facility] is the sole provider of these medications. Rechecks will occur regularly and the patient is aware of our use of the system.  Below are copied direc from pt's AVS and was reviewed in detail w/ pt during her OV.   You have 8 vicodin (hydrocodone) 10mg  a day and 3 xanax (alprazolam) a day.   In addition - 9 days ago you filled 70 tabs of dilaudid and 15 times of once a day extended-release xanax IN ADDITION to your vicodin and xanax. If you are out of medication - that means you have been using 8 dilaudid a day and 2 extended release xanax a day in addition to you. Less than 1 month ago, you also were given 24 doses of a hydrocodone cough syrup which you report was gone in 2 swigs - which means that each "swig" contained 60mg  of hydrocodone (= to 6 tabs of your pain medication). You are getting worse - your mood is worse, your memory is worse, you report feeling worse overall which is likely over-sedation and then the withdrawal after.   I don't think any of this is helping you at all - it is only making you feel more confused, achy, and preventing you from getting good sleep.  ALL OF THESE  MEDICINES ARE MAKING YOU WORSE, NOT BETTER.  I THINK YOU SHOULD CHECKED INTO A REHAB TO HELP YOU GO THROUGH WITHDRAWAL SO YOU CAN FIND OUT WHAT YOUR BASELINE IS AND THEN WE CAN TREAT YOUR SYMPTOMS - YOUR MOOD AND PAIN - RATHER THAN CURRENTLY TREATING SIDE EFFECTS FROM MEDICATION.  [name], MD MPH

## 2015-10-15 NOTE — Patient Instructions (Addendum)
     IF you received an x-ray today, you will receive an invoice from [facility]. Please contact [facility] at [phone] with questions or concerns regarding your invoice.   IF you received labwork today, you will receive an invoice from [employer]. Please contact [facility] at [phone] with questions or concerns regarding your invoice.   Our billing staff will not be able to assist you with questions regarding bills from these companies.  You will be contacted with the lab results as soon as they are available. The fastest way to get your results is to activate your My Chart account. Instructions are located on the last page of this paperwork. If you have not heard from [place] regarding the results in 2 weeks, please contact this office.    You have 8 vicodin (hydrocodone) 10mg  a day and 3 xanax (alprazolam) a day.   In addition - 9 days ago you filled 70 tabs of dilaudid and 15 times of once a day extended-release xanax IN ADDITION to your vicodin and xanax. If you are out of medication - that means you have been using 8 dilaudid a day and 2 extended release xanax a day in addition to you. Less than 1 month ago, you also were given 24 doses of a hydrocodone cough syrup which you report was gone in 2 swigs - which means that each "swig" contained 60mg  of hydrocodone (= to 6 tabs of your pain medication). You are getting worse - your mood is worse, your memory is worse, you report feeling worse overall which is likely over-sedation and then the withdrawal after.   I don't think any of this is helping you at all - it is only making you feel more confused, achy, and preventing you from getting good sleep.  ALL OF THESE MEDICINES ARE MAKING YOU WORSE, NOT BETTER.  I THINK YOU SHOULD CHECKED INTO A REHAB TO HELP YOU GO THROUGH WITHDRAWAL SO YOU CAN FIND OUT WHAT YOUR BASELINE IS AND THEN WE CAN TREAT YOUR SYMPTOMS - YOUR MOOD AND PAIN - RATHER THAN  CURRENTLY TREATING SIDE EFFECTS FROM MEDICATION.

## 2015-10-17 DIAGNOSIS — J449 Chronic obstructive pulmonary disease, unspecified: Secondary | ICD-10-CM | POA: Diagnosis not present

## 2015-10-20 ENCOUNTER — Ambulatory Visit (INDEPENDENT_AMBULATORY_CARE_PROVIDER_SITE_OTHER): Payer: Medicare Other | Admitting: Family Medicine

## 2015-10-20 ENCOUNTER — Ambulatory Visit (INDEPENDENT_AMBULATORY_CARE_PROVIDER_SITE_OTHER): Payer: Medicare Other

## 2015-10-20 VITALS — BP 122/68 | HR 78 | Temp 98.8°F | Resp 20 | Ht 63.0 in | Wt 99.5 lb

## 2015-10-20 DIAGNOSIS — J329 Chronic sinusitis, unspecified: Secondary | ICD-10-CM

## 2015-10-20 DIAGNOSIS — J309 Allergic rhinitis, unspecified: Secondary | ICD-10-CM | POA: Diagnosis not present

## 2015-10-20 DIAGNOSIS — J328 Other chronic sinusitis: Secondary | ICD-10-CM | POA: Diagnosis not present

## 2015-10-20 DIAGNOSIS — J069 Acute upper respiratory infection, unspecified: Secondary | ICD-10-CM

## 2015-10-20 DIAGNOSIS — J449 Chronic obstructive pulmonary disease, unspecified: Secondary | ICD-10-CM | POA: Diagnosis not present

## 2015-10-20 MED ORDER — FLUTICASONE PROPIONATE 50 MCG/ACT NA SUSP: 2.0000 | Freq: Every day | NASAL | Status: DC

## 2015-10-20 MED ORDER — IPRATROPIUM BROMIDE 0.03 % NA SOLN: 2.0000 | Freq: Two times a day (BID) | NASAL | Status: DC

## 2015-10-20 NOTE — Patient Instructions (Addendum)
Always drink lots of water to keep yourself well hydrated. That keeps the mucus thin.  Use the Atrovent nasal spray 2 sprays in each nostril 4 times daily only when needed for runny nose  Use the fluticasone nose spray 2 sprays each nostril twice daily for 5 days, then decrease to once daily. Do this on a consistent basis to treat the lining of the nose for allergies and irritations  Take over-the-counter Allegra (fexofenadine) 1 daily  I strongly urge you to make up your mind to quit smoking completely. The best way to do that is to pick a target date by month or so ahead, for instance Memorial Day, and focus on it. Force yourself to cut back until you are smoking only about a fourth of the amount your smoking at this time. Then on your chosen quit date you discard use cigarettes and except the fact that you're going to be miserable but bear with it until you get feeling better. Do not yield to smoking a single cigarette or you will return to your direction. Nobody can make you quit smoking. You need to make up her mind to do so, or the consequences of smoking avoid ultimately daily.    IF you received an x-ray today, you will receive an invoice from [facility]. Please contact [facility] at [phone] with questions or concerns regarding your invoice.   IF you received labwork today, you will receive an invoice from [employer]. Please contact [facility] at [phone] with questions or concerns regarding your invoice.   Our billing staff will not be able to assist you with questions regarding bills from these companies.  You will be contacted with the lab results as soon as they are available. The fastest way to get your results is to activate your My Chart account. Instructions are located on the last page of this paperwork. If you have not heard from [place] regarding the results in 2 weeks, please contact this office.

## 2015-10-20 NOTE — Progress Notes (Signed)
Patient ID: [name], female    DOB: 07-18-1950  Age: 65 y.o. MRN: [id number]  Chief Complaint  Patient presents with  . URI    low grade fever, cough that is non productive  x several wks.      Subjective:   Patient continues to have problems with head congestion. She has a constant drainage from her nose. Most of the time it's clear. She does continue to smoke. She has emphysema and COPD. Her cough is not as bad right now. She has been on antibiotics and steroids keeps having symptoms. Using electric cigarrette in exam room  Current allergies, medications, problem list, past/family and social histories reviewed.  Objective:  BP 122/68 mmHg  Pulse 78  Temp(Src) 98.8 F (37.1 C) (Oral)  Resp 20  Ht 5\' 3"  (1.6 m)  Wt 99 lb 8 oz (45.133 kg)  BMI 17.63 kg/m2  SpO2 95%  Constant sniffling. TMs normal on the left, wax in the right. Mild sinus tenderness. Throat not erythematous. Neck supple without nodes. Chest: Little bit of a emphysematous posturing of her shoulders. Her lungs are clear. Heart regular without murmurs.  Assessment & Plan:   Assessment: 1. Acute upper respiratory infection   2. Chronic sinusitis, unspecified location   3. Allergic rhinitis, unspecified allergic rhinitis type       Plan: Sinus x-ray since the accident and chronic symptoms.  Orders Placed This Encounter  Procedures  . DG SinUS 1-2 Views    Order Specific Question:  Reason for Exam (SYMPTOM  OR DIAGNOSIS REQUIRED)    Answer:  chronic sinusitis symptoms    Order Specific Question:  Preferred imaging location?    Answer:  External   Underdeveloped frontal sinuses. No major thickening in maxillary sinuses. Prominent sagittal suture. Radiology reading is still pending. Long discussion 6-8 min regarding need to quit cigarettes.   See instructions.  Meds ordered this encounter  Medications  . ipratropium (ATROVENT) 0.03 % nasal spray    Sig: Place 2 sprays into both nostrils 2 (two) times  daily.    Dispense:  30 mL    Refill:  0  . fluticasone (FLONASE) 50 MCG/ACT nasal spray    Sig: Place 2 sprays into both nostrils daily.    Dispense:  16 g    Refill:  6         Patient Instructions   Always drink lots of water to keep yourself well hydrated. That keeps the mucus thin.  Use the Atrovent nasal spray 2 sprays in each nostril 4 times daily only when needed for runny nose  Use the fluticasone nose spray 2 sprays each nostril twice daily for 5 days, then decrease to once daily. Do this on a consistent basis to treat the lining of the nose for allergies and irritations  Take over-the-counter Allegra (fexofenadine) 1 daily  I strongly urge you to make up your mind to quit smoking completely. The best way to do that is to pick a target date by month or so ahead, for instance Memorial Day, and focus on it. Force yourself to cut back until you are smoking only about a fourth of the amount your smoking at this time. Then on your chosen quit date you discard use cigarettes and except the fact that you're going to be miserable but bear with it until you get feeling better. Do not yield to smoking a single cigarette or you will return to your direction. Nobody can make you quit smoking.  You need to make up her mind to do so, or the consequences of smoking avoid ultimately daily.    IF you received an x-ray today, you will receive an invoice from [facility]. Please contact [facility] at [phone] with questions or concerns regarding your invoice.   IF you received labwork today, you will receive an invoice from [employer]. Please contact [facility] at [phone] with questions or concerns regarding your invoice.   Our billing staff will not be able to assist you with questions regarding bills from these companies.  You will be contacted with the lab results as soon as they are available. The fastest way to get your results is to  activate your My Chart account. Instructions are located on the last page of this paperwork. If you have not heard from [place] regarding the results in 2 weeks, please contact this office.        Patient wants something for her nerves if she quit smoking. I told her to come back after she quit if she was finding she was staying too nervous at that point. She has unsuccessfully used Chantix in the past. She needs to make up her mind she is going to quit.  Return if symptoms worsen or fail to improve.   HOPPER,DAVID, MD 10/20/2015

## 2015-10-21 ENCOUNTER — Telehealth: Payer: Self-pay

## 2015-10-21 NOTE — Telephone Encounter (Signed)
Pt has dropped off paperwork concerning a Disability Parking Placard she would like Dr. Brigitte Pulse to fill out. I have placed paperwork in nurse's box at 102. CB [phone]

## 2015-10-23 NOTE — Telephone Encounter (Signed)
Dr. Brigitte Pulse in your box.

## 2015-10-25 ENCOUNTER — Ambulatory Visit (INDEPENDENT_AMBULATORY_CARE_PROVIDER_SITE_OTHER): Payer: Medicare Other

## 2015-10-25 ENCOUNTER — Ambulatory Visit (INDEPENDENT_AMBULATORY_CARE_PROVIDER_SITE_OTHER): Payer: Medicare Other | Admitting: Internal Medicine

## 2015-10-25 VITALS — BP 114/72 | HR 85 | Temp 98.5°F | Resp 17 | Ht 63.0 in | Wt 101.0 lb

## 2015-10-25 DIAGNOSIS — M25532 Pain in left wrist: Secondary | ICD-10-CM | POA: Diagnosis not present

## 2015-10-25 NOTE — Patient Instructions (Signed)
     IF you received an x-ray today, you will receive an invoice from [facility]. Please contact [facility] at [phone] with questions or concerns regarding your invoice.   IF you received labwork today, you will receive an invoice from [facility]. Please contact [facility] at [phone] with questions or concerns regarding your invoice.   Our billing staff will not be able to assist you with questions regarding bills from these companies.  You will be contacted with the lab results as soon as they are available. The fastest way to get your results is to activate your My Chart account. Instructions are located on the last page of this paperwork. If you have not heard from us regarding the results in 2 weeks, please contact this office.      

## 2015-10-25 NOTE — Progress Notes (Signed)
By signing my name below I, Tereasa Coop, attest that this documentation has been prepared under the direction and in the presence of Tami Lin, MD. Electonically Signed. Tereasa Coop, Scribe 10/25/2015 at 1:06 PM   Subjective:    Patient ID: [name], female    DOB: Oct 08, 1950, 65 y.o.   MRN: [id number]  Chief Complaint  Patient presents with  . Hand Injury    Left    HPI [name] is a 65 y.o. female who presents to the [facility] complaining of left hand pain. Pt states that she woke up this morning with left hand pain. Pt states that she is weak and falls against the walls in her house frequently and states that she thinks she could have injured her left hand last night by falling into a wall. Pt states hand pain is worsened when she tries to grip something.   Patient Active Problem List   Diagnosis Date Noted  . Abdominal pain   . Hypoxia 04/08/2015  . Opioid use with withdrawal ([place])   . Protein-calorie malnutrition ([place])   . Episode of recurrent major depressive disorder ([place])   . Secondary hyperparathyroidism ([employer]) 01/10/2015  . Vitamin D deficiency 01/10/2015  . 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 Anemia, iron deficiency 11/03/2014  . Chronic pain syndrome 10/24/2014  . Osteoporosis 10/05/2014  . Vertebral compression fracture ([place]) 10/05/2014  . Coronary artery disease due to calcified coronary lesion 10/05/2014  . Weight loss   . Tobacco abuse   . History of subtotal gastrectomy with Roux-en-Y recontruction   . Orthostatic hypotension 08/11/2014  . Severe protein-calorie malnutrition ([place]) 12/30/2013  . Constipation, chronic 07/10/2013  . Chronic abdominal pain   . COPD (chronic obstructive pulmonary disease) ([place]) 11/07/2010  . Major depressive disorder, recurrent episode, moderate ([place]) 06/06/2010  . 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 Anxiety state 01/07/2007  . BARRETT'S ESOPHAGUS, HX OF 07/03/2005     Current outpatient prescriptions:  .  ALPRAZolam (XANAX) 1 MG tablet, Take 1 tablet (1 mg total) by mouth 3 (three) times daily as needed for anxiety. This is a 1 month supply, Disp: 90 tablet, Rfl: 0 .  budesonide-formoterol (SYMBICORT) 160-4.5 MCG/ACT inhaler, Inhale 2 puffs into the lungs 2 (two) times daily., Disp: 1 Inhaler, Rfl: 3 .  DULoxetine (CYMBALTA) 60 MG capsule, Take 1 capsule (60 mg total) by mouth daily., Disp: 30 capsule, Rfl: 1 .  Ergocalciferol (VITAMIN D2 PO), Take by mouth., Disp: , Rfl:  .  feeding supplement (BOOST / RESOURCE BREEZE) LIQD, Take 1 Container by mouth 3 (three) times daily between meals., Disp: 90 Container, Rfl: 11 .  fluticasone (FLONASE) 50 MCG/ACT nasal spray, Place 2 sprays into both nostrils daily., Disp: 16 g, Rfl: 6 .  guaiFENesin-codeine 100-10 MG/5ML syrup, Take 10 mLs by mouth every 4 (four) hours as needed for cough., Disp: 240 mL, Rfl: 0 .  ipratropium (ATROVENT) 0.03 % nasal spray, Place 2 sprays into both nostrils 2 (two) times daily., Disp: 30 mL, Rfl: 0 .  ipratropium-albuterol (DUONEB) 0.5-2.5 (3) MG/3ML SOLN, Take 3 mLs by nebulization 4 (four) times daily -  before meals and at bedtime., Disp: 360 mL, Rfl: 3 .  OLANZapine (ZYPREXA) 7.5 MG tablet, Take 1 tablet (7.5 mg total) by mouth at bedtime., Disp: 30 tablet, Rfl: 0 .  omeprazole (PRILOSEC) 40 MG capsule, TAKE ONE CAPSULE BY MOUTH TWICE DAILY, Disp: 180 capsule, Rfl: 3 .  ondansetron (ZOFRAN ODT)  4 MG disintegrating tablet, Take 1-2 tablets (4-8 mg total) by mouth every 8 (eight) hours as needed for nausea or vomiting., Disp: 180 tablet, Rfl: 0 .  albuterol (PROVENTIL HFA;VENTOLIN HFA) 108 (90 Base) MCG/ACT inhaler, Inhale 2 puffs into the lungs every 4 (four) hours as needed (cough, shortness of breath or wheezing.). (Patient not taking: Reported on 10/25/2015), Disp: 1 Inhaler, Rfl: 3 .  Aspirin-Acetaminophen-Caffeine (GOODY HEADACHE PO), Take 1 packet by mouth 4 (four) times daily as  needed (pain). Reported on 10/25/2015, Disp: , Rfl:  .  Calcium Citrate-Vitamin D (CALCIUM CITRATE CHEWY BITE) 500-500 MG-UNIT CHEW, Chew 1 tablet by mouth 2 (two) times daily. (Patient not taking: Reported on 10/25/2015), Disp: 180 tablet, Rfl: 3 .  HYDROcodone-acetaminophen (NORCO) 10-325 MG tablet, Take 1-2 tablets by mouth every 6 (six) hours as needed for moderate pain. May dispense on or after 09/30/2015. This is a 1 month supply. (Patient not taking: Reported on 10/25/2015), Disp: 240 tablet, Rfl: 0 .  UNABLE TO FIND, Place 1 application into the nose once. Med Name: best Fit evaluation for oxygen, possibly simply go mini, Disp: 1 Device, Rfl: 0 .  UNABLE TO FIND, Med Name: Best fit evaluation for oxygen - possible simply go mini, Disp: 1 Device, Rfl: 0  Allergies  Allergen Reactions  . Lithium Nausea And Vomiting  . Celebrex [Celecoxib] Other (See Comments)    History of bleeding ulcers  . Morphine Itching  . Quetiapine Other (See Comments)     tardive dyskinesia  . Varenicline Tartrate Other (See Comments)    hallucinations     Review of Systems  Musculoskeletal:       Positive for left hand pain       Objective:   Physical Exam  Constitutional: She is oriented to person, place, and time. She appears well-developed and well-nourished. No distress.  HENT:  Head: Normocephalic and atraumatic.  Eyes: Conjunctivae are normal. Pupils are equal, round, and reactive to light.  Neck: Neck supple.  Cardiovascular: Normal rate.   Pulmonary/Chest: Effort normal.  Musculoskeletal: Normal range of motion.  Pt's left wrist and hand are non-swollen with good ROM. Pt has no pain with ROM in left wrist and hand. Pt has mild tenderness over the carpel area dorsally. Pt's left hand grip strength is intact.  Neurological: She is alert and oriented to person, place, and time.  Skin: Skin is warm and dry.  Psychiatric: She has a normal mood and affect. Her behavior is normal.  Nursing note and  vitals reviewed.    Filed Vitals:   10/25/15 1115  BP: 114/72  Pulse: 85  Temp: 98.5 F (36.9 C)  TempSrc: Oral  Resp: 17  Height: 5\' 3"  (1.6 m)  Weight: 101 lb (45.813 kg)  SpO2: 96%   X-rays reveal no fracture about the distal forearm or the wrist or hand      Assessment & Plan:   I have completed the patient encounter in its entirety as documented by the scribe, with editing by me where necessary. Margareta Laureano P. Laney Pastor, M.D.    Pain, wrist joint, left  Due to contusion - Plan: Splint -ices for 20 minutes twice a day -Recheck 7-10 days if not well

## 2015-10-28 ENCOUNTER — Other Ambulatory Visit: Payer: Self-pay | Admitting: Family Medicine

## 2015-10-28 ENCOUNTER — Ambulatory Visit (INDEPENDENT_AMBULATORY_CARE_PROVIDER_SITE_OTHER): Payer: Medicare Other | Admitting: Family Medicine

## 2015-10-28 VITALS — BP 112/70 | HR 79 | Temp 98.1°F | Resp 20 | Ht 63.0 in | Wt 99.4 lb

## 2015-10-28 DIAGNOSIS — R636 Underweight: Secondary | ICD-10-CM

## 2015-10-28 DIAGNOSIS — F329 Major depressive disorder, single episode, unspecified: Secondary | ICD-10-CM

## 2015-10-28 DIAGNOSIS — J449 Chronic obstructive pulmonary disease, unspecified: Secondary | ICD-10-CM | POA: Diagnosis not present

## 2015-10-28 DIAGNOSIS — G894 Chronic pain syndrome: Secondary | ICD-10-CM

## 2015-10-28 DIAGNOSIS — F32A Depression, unspecified: Secondary | ICD-10-CM

## 2015-10-28 LAB — PULMONARY FUNCTION TEST

## 2015-10-28 MED ORDER — HYDROCODONE-ACETAMINOPHEN 10-325 MG PO TABS: 1.0000 | ORAL_TABLET | Freq: Four times a day (QID) | ORAL | Status: DC | PRN

## 2015-10-28 MED ORDER — BUDESONIDE-FORMOTEROL FUMARATE 160-4.5 MCG/ACT IN AERO: 2.0000 | INHALATION_SPRAY | Freq: Two times a day (BID) | RESPIRATORY_TRACT | Status: DC

## 2015-10-28 MED ORDER — BENZONATATE 200 MG PO CAPS: 200.0000 mg | ORAL_CAPSULE | Freq: Three times a day (TID) | ORAL | Status: DC | PRN

## 2015-10-28 MED ORDER — OLANZAPINE 10 MG PO TABS: 10.0000 mg | ORAL_TABLET | Freq: Every day | ORAL | Status: DC

## 2015-10-28 MED ORDER — DULOXETINE HCL 60 MG PO CPEP: 60.0000 mg | ORAL_CAPSULE | Freq: Every day | ORAL | Status: DC

## 2015-10-28 NOTE — Telephone Encounter (Signed)
Pt states she was seen today 10/28/2015... She is in need of a cough medicine  [place] please call  -[phone]

## 2015-10-28 NOTE — Progress Notes (Signed)
Subjective:    Patient ID: [name], female    DOB: 08/24/1950, 65 y.o.   MRN: [id number] By signing my name below, I, [name], attest that this documentation has been prepared under the direction and in the presence of [name] Cheadle, MD. Electronically Signed: Judithe [name], ER Scribe. 10/28/2015. 8:46 AM.  Chief Complaint  Patient presents with  . Cough    seen last week for cough/chest congestion.  finished abx and pred and pt stated that she is not better    HPI HPI Comments: [name] is a 65 y.o. female who presents to [facility] complaining of a increased cough for the last week. She has had a mild intermittent cough for the last six months. She denies increased SOB, fever, hoarse voice, chills, rhinorrhea, heart burn or ear popping. She endorses associated nasal congestion, and mild sinus congestion and pain. She states she coughs up purulent drainage in the morning. She is not taking any allergy medications. She is not using her inhaler. She is smoking 1 PPD. She uses her oxygen at home and when she sleeps, but does not bring it when she goes out.   She took her lacis last night because she was having trouble getting her rings off.    Past Medical History  Diagnosis Date  . Peptic ulcer disease     dr [name]  . Acute duodenal ulcer with hemorrhage and perforation, with obstruction ([place])   . Barrett's esophagus   . Menopause   . GERD (gastroesophageal reflux disease)   . Asthma   . Chronic abdominal pain     narcotic dependence, dr gyarteng-dak at [facility]  . Narcotic abuse   . Anxiety     sees [name] Ripple NP at Dr. Radonna [name] office  . Fx two ribs-open 08-24-11    left 4th and 5th  . Fx of fibula 07-28-11    left fibula, 2 places   . COPD (chronic obstructive pulmonary disease) ([place])   . Allergy   . Anemia   . History of blood transfusion     "related to OR; maybe when I perforated that ulcer"  . Lap Nissen + truncal vagotomy July 2008 05/13/2013   . LEG EDEMA 01/19/2008    Qualifier: Diagnosis of  By: [name] MD, [name]   . ACUTE DUODEN ULCER W/HEMORR PERF&OBSTRUCTION 06/26/2004    Qualifier: Diagnosis of  By: [name] MD, [name]   . FEVER, RECURRENT 08/11/2009    Qualifier: Diagnosis of  By: [name] MD, [name] MENOPAUSE 01/07/2007    Qualifier: Diagnosis of  By: [name], CMA, [place]    . Gastroparesis 06/03/2007    Qualifier: Diagnosis of  By: [name] MD, [name]   . GASTRIC OUTLET OBSTRUCTION 08/28/2007    In July 2008 she underwent laparoscopic enterolysis, Nissen fundoplication over a [id number] bougie, single pledgeted suture colosure of the hiatus and a 3 suture wrap.  Lap truncal vagotomy and a loop gastrojejunostomy was performed.  This was revised in August of 2009 to a roux en Y gastrojejujostomy.     . 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 Depression   . History of hiatal hernia   . Complication of anesthesia     pt states had too much xanax on board - agitated   . Anginal pain ([place])     "many many years ago"  . Exertional shortness of breath   . Pneumonia, organism  unspecified 11/07/2010    Assoc with R Parapneumonic effusion 09/2010    - Tapped 10/05/10    - CxR resolved 11/07/2010    . Headache     "monthly" (09/06/2014)  . Arthritis     "hips" (09/06/2014)  . Myocardial infarction ([place])   . S/P jejunostomy Feb 2016 07/26/2014   Allergies  Allergen Reactions  . Lithium Nausea And Vomiting  . Celebrex [Celecoxib] Other (See Comments)    History of bleeding ulcers  . Morphine Itching  . Quetiapine Other (See Comments)     tardive dyskinesia  . Varenicline Tartrate Other (See Comments)    hallucinations   Current Outpatient Prescriptions on File Prior to Visit  Medication Sig Dispense Refill  . albuterol (PROVENTIL HFA;VENTOLIN HFA) 108 (90 Base) MCG/ACT inhaler Inhale 2 puffs into the lungs every 4 (four) hours as needed (cough, shortness of breath or wheezing.). 1 Inhaler 3  . ALPRAZolam (XANAX) 1  MG tablet Take 1 tablet (1 mg total) by mouth 3 (three) times daily as needed for anxiety. This is a 1 month supply 90 tablet 0  . Aspirin-Acetaminophen-Caffeine (GOODY HEADACHE PO) Take 1 packet by mouth 4 (four) times daily as needed (pain). Reported on 10/25/2015    . budesonide-formoterol (SYMBICORT) 160-4.5 MCG/ACT inhaler Inhale 2 puffs into the lungs 2 (two) times daily. 1 Inhaler 3  . Calcium Citrate-Vitamin D (CALCIUM CITRATE CHEWY BITE) 500-500 MG-UNIT CHEW Chew 1 tablet by mouth 2 (two) times daily. 180 tablet 3  . DULoxetine (CYMBALTA) 60 MG capsule Take 1 capsule (60 mg total) by mouth daily. 30 capsule 1  . Ergocalciferol (VITAMIN D2 PO) Take by mouth.    . feeding supplement (BOOST / RESOURCE BREEZE) LIQD Take 1 Container by mouth 3 (three) times daily between meals. 90 Container 11  . fluticasone (FLONASE) 50 MCG/ACT nasal spray Place 2 sprays into both nostrils daily. 16 g 6  . guaiFENesin-codeine 100-10 MG/5ML syrup Take 10 mLs by mouth every 4 (four) hours as needed for cough. 240 mL 0  . HYDROcodone-acetaminophen (NORCO) 10-325 MG tablet Take 1-2 tablets by mouth every 6 (six) hours as needed for moderate pain. May dispense on or after 09/30/2015. This is a 1 month supply. 240 tablet 0  . ipratropium (ATROVENT) 0.03 % nasal spray Place 2 sprays into both nostrils 2 (two) times daily. 30 mL 0  . ipratropium-albuterol (DUONEB) 0.5-2.5 (3) MG/3ML SOLN Take 3 mLs by nebulization 4 (four) times daily -  before meals and at bedtime. 360 mL 3  . OLANZapine (ZYPREXA) 7.5 MG tablet Take 1 tablet (7.5 mg total) by mouth at bedtime. 30 tablet 0  . omeprazole (PRILOSEC) 40 MG capsule TAKE ONE CAPSULE BY MOUTH TWICE DAILY 180 capsule 3  . ondansetron (ZOFRAN ODT) 4 MG disintegrating tablet Take 1-2 tablets (4-8 mg total) by mouth every 8 (eight) hours as needed for nausea or vomiting. 180 tablet 0  . UNABLE TO FIND Place 1 application into the nose once. Med Name: best Fit evaluation for oxygen,  possibly simply go mini 1 Device 0  . UNABLE TO FIND Med Name: Best fit evaluation for oxygen - possible simply go mini 1 Device 0   No current facility-administered medications on file prior to visit.    Review of Systems  Constitutional: Positive for activity change, appetite change and fatigue. Negative for fever, chills and unexpected weight change.  HENT: Positive for congestion, postnasal drip and sinus pressure. Negative for rhinorrhea, trouble swallowing and voice  change.   Respiratory: Positive for cough and wheezing. Negative for shortness of breath (no increase from baseline).   Gastrointestinal: Positive for nausea, abdominal pain and constipation.  Musculoskeletal: Positive for back pain and arthralgias. Negative for joint swelling.  Skin: Negative for color change.  Hematological: Negative for adenopathy.  Psychiatric/Behavioral: Positive for behavioral problems, confusion, sleep disturbance, dysphoric mood, decreased concentration and agitation. Negative for suicidal ideas, hallucinations and self-injury. The patient is not hyperactive.       Objective:  BP 112/70 mmHg  Pulse 79  Temp(Src) 98.1 F (36.7 C) (Oral)  Resp 20  Ht 5\' 3"  (1.6 m)  Wt 99 lb 6 oz (45.076 kg)  BMI 17.61 kg/m2  SpO2 92%  Physical Exam  Constitutional: She is oriented to person, place, and time. She appears well-developed and well-nourished. No distress.  HENT:  Head: Normocephalic and atraumatic.  Left TM normal, right TM occluded with cerumen. Nares normal. Oropharynx normal.   Eyes: Pupils are equal, round, and reactive to light.  Neck: Neck supple.  Cardiovascular: Normal rate, regular rhythm and normal heart sounds.   No murmur heard. Pulmonary/Chest: Effort normal. No respiratory distress.  Good air movement. Lungs clear.   Musculoskeletal: Normal range of motion.  Neurological: She is alert and oriented to person, place, and time. Coordination normal.  Skin: Skin is warm and dry. She  is not diaphoretic.  Psychiatric: She has a normal mood and affect. Her behavior is normal.  Nursing note and vitals reviewed.  With ambulation O2 sat 95-98%.    Spirometry (preneb) with very severe obstruction FVC 41% pred FEV1 24% pred FEV1/FVC 44%  Assessment & Plan:   1. Chronic obstructive pulmonary disease, unspecified COPD type ([place]) - pts o2 sat with ambulation today is excellent - I think pt only needs oxygen during acute illness and I am very concerned by the fact that she has continued to smoke in the house with the oxygen tank so prefer that she have the tank removed. Pt wants to keep a small portable tank for times of acute illness which she could keep in the closet away from her tobacco use. Needs to restart her symbicort.  Advised pt that I do not think she is acutely ill - that her symptoms are likely that of COPD and perhaps seasonal allergies but she may be at her new baseline as she has been complaining of feeling acutely ill with URI sxs for > 6 mos now.  2. Depression - pt wonders whether she needs to be on a different antidepressent - she was on prozac sev yrs ago and we tried her on remeron before being on cymbalta - although she is complaining of depression - her affect, demeanor, and complaints are drastically improved. While on the prev anti-depressants she would often present in tears, emotional distress, and make vague suicidal threats (though always denied true ideation).  Ok to try to increase cymbalta to 90mg . Make sure she is taking the zyprexa qhs  3. Chronic pain syndrome - refilled chronic pain medication.  4. Underweight    Encouraged pt to get a med box with 4 different med times for each day of the wk.  Orders Placed This Encounter  Procedures  . CT CHEST LUNG CA SCREEN LOW DOSE W/O CM    Standing Status: Future     Number of Occurrences:      Standing Expiration Date: 12/27/2016    Order Specific Question:  Reason for Exam (SYMPTOM  OR DIAGNOSIS  REQUIRED)    Answer:  screening, copd wiht ongoing tobacco use    Order Specific Question:  Preferred Imaging Location?    Answer:  [id number]    Meds ordered this encounter  Medications  . OLANZapine (ZYPREXA) 10 MG tablet    Sig: Take 1 tablet (10 mg total) by mouth at bedtime.    Dispense:  30 tablet    Refill:  5    Failed risperdal, cannot tolerate seroquel due to tardive dyskinesia  . DULoxetine (CYMBALTA) 60 MG capsule    Sig: Take 1 capsule (60 mg total) by mouth daily.    Dispense:  30 capsule    Refill:  1  . budesonide-formoterol (SYMBICORT) 160-4.5 MCG/ACT inhaler    Sig: Inhale 2 puffs into the lungs 2 (two) times daily.    Dispense:  1 Inhaler    Refill:  3  . HYDROcodone-acetaminophen (NORCO) 10-325 MG tablet    Sig: Take 1-2 tablets by mouth every 6 (six) hours as needed for moderate pain. This is a 1 month supply.    Dispense:  240 tablet    Refill:  0    I personally performed the services described in this documentation, which was scribed in my presence. The recorded information has been reviewed and considered, and addended by me as needed.  [name] Cheadle, MD MPH

## 2015-10-28 NOTE — Patient Instructions (Addendum)
     IF you received an x-ray today, you will receive an invoice from [facility]. Please contact [facility] at [phone] with questions or concerns regarding your invoice.   IF you received labwork today, you will receive an invoice from [employer]. Please contact [facility] at [phone] with questions or concerns regarding your invoice.   Our billing staff will not be able to assist you with questions regarding bills from these companies.  You will be contacted with the lab results as soon as they are available. The fastest way to get your results is to activate your My Chart account. Instructions are located on the last page of this paperwork. If you have not heard from [place] regarding the results in 2 weeks, please contact this office.    [facility] is now offering annual lung cancer screening by low-dose CT scan.  This is covered for qualifying patients and your insurance will be checked before the procedure.  Call the lung cancer screening nurse navigators at [phone] to learn more about this and get scheduled.

## 2015-10-28 NOTE — Telephone Encounter (Signed)
I sent tessalon pearles into her pharmacy. No narcotic cough medicine - as we talked about, she is not ill - it is just her very severe copd and this is how her lungs are going to be.  Perhaps if she restart the symbicort that should help.  If she wants to use a narcotic cough medicine, then we are going to have to wean back her pain meds first.

## 2015-10-29 DIAGNOSIS — J449 Chronic obstructive pulmonary disease, unspecified: Secondary | ICD-10-CM | POA: Diagnosis not present

## 2015-10-30 ENCOUNTER — Telehealth: Payer: Self-pay | Admitting: Emergency Medicine

## 2015-10-30 NOTE — Telephone Encounter (Signed)
Left message Rx sent

## 2015-10-30 NOTE — Telephone Encounter (Signed)
Left message on VM.

## 2015-11-02 ENCOUNTER — Other Ambulatory Visit: Payer: Self-pay | Admitting: Family Medicine

## 2015-11-04 NOTE — Telephone Encounter (Signed)
Pt states she is still experiencing a cough. She is requesting a refill on previous Rx.  [phone]

## 2015-11-04 NOTE — Telephone Encounter (Signed)
I have a hard time believing that pt would like a refill on her vitamin D because she has a cough. . .  After her rx vitamin D is completed, she can switch to over the counter vitamin D of 5000u a day.

## 2015-11-06 ENCOUNTER — Telehealth: Payer: Self-pay

## 2015-11-06 NOTE — Telephone Encounter (Signed)
Pt is needing a refill on her cough medication  Best number [phone]

## 2015-11-07 NOTE — Telephone Encounter (Signed)
Patient states her cough isn't any better would like medication.

## 2015-11-08 ENCOUNTER — Other Ambulatory Visit: Payer: Self-pay | Admitting: Family Medicine

## 2015-11-09 ENCOUNTER — Inpatient Hospital Stay: Admission: RE | Admit: 2015-11-09 | Payer: Self-pay | Source: Ambulatory Visit

## 2015-11-10 ENCOUNTER — Emergency Department (HOSPITAL_COMMUNITY): Payer: Medicare Other

## 2015-11-10 ENCOUNTER — Encounter (HOSPITAL_COMMUNITY): Payer: Self-pay | Admitting: Emergency Medicine

## 2015-11-10 ENCOUNTER — Emergency Department (HOSPITAL_COMMUNITY)
Admission: EM | Admit: 2015-11-10 | Discharge: 2015-11-11 | Disposition: A | Discharge status: Home / Self Care | Payer: Medicare Other | Attending: Emergency Medicine | Admitting: Emergency Medicine

## 2015-11-10 DIAGNOSIS — I252 Old myocardial infarction: Secondary | ICD-10-CM | POA: Diagnosis not present

## 2015-11-10 DIAGNOSIS — F17210 Nicotine dependence, cigarettes, uncomplicated: Secondary | ICD-10-CM | POA: Insufficient documentation

## 2015-11-10 DIAGNOSIS — K3184 Gastroparesis: Secondary | ICD-10-CM | POA: Diagnosis not present

## 2015-11-10 DIAGNOSIS — R400 Somnolence: Secondary | ICD-10-CM | POA: Insufficient documentation

## 2015-11-10 DIAGNOSIS — Z7982 Long term (current) use of aspirin: Secondary | ICD-10-CM | POA: Diagnosis not present

## 2015-11-10 DIAGNOSIS — Z79891 Long term (current) use of opiate analgesic: Secondary | ICD-10-CM | POA: Insufficient documentation

## 2015-11-10 DIAGNOSIS — J449 Chronic obstructive pulmonary disease, unspecified: Secondary | ICD-10-CM | POA: Diagnosis not present

## 2015-11-10 DIAGNOSIS — M160 Bilateral primary osteoarthritis of hip: Secondary | ICD-10-CM | POA: Insufficient documentation

## 2015-11-10 DIAGNOSIS — Z7951 Long term (current) use of inhaled steroids: Secondary | ICD-10-CM | POA: Insufficient documentation

## 2015-11-10 DIAGNOSIS — F329 Major depressive disorder, single episode, unspecified: Secondary | ICD-10-CM | POA: Diagnosis not present

## 2015-11-10 DIAGNOSIS — T424X1A Poisoning by benzodiazepines, accidental (unintentional), initial encounter: Secondary | ICD-10-CM | POA: Diagnosis not present

## 2015-11-10 DIAGNOSIS — Z79899 Other long term (current) drug therapy: Secondary | ICD-10-CM | POA: Diagnosis not present

## 2015-11-10 DIAGNOSIS — I6789 Other cerebrovascular disease: Secondary | ICD-10-CM | POA: Diagnosis not present

## 2015-11-10 DIAGNOSIS — G459 Transient cerebral ischemic attack, unspecified: Secondary | ICD-10-CM | POA: Diagnosis not present

## 2015-11-10 DIAGNOSIS — Z9660 Presence of unspecified orthopedic joint implant: Secondary | ICD-10-CM | POA: Insufficient documentation

## 2015-11-10 DIAGNOSIS — R4182 Altered mental status, unspecified: Secondary | ICD-10-CM | POA: Diagnosis not present

## 2015-11-10 LAB — CBC WITH DIFFERENTIAL/PLATELET
Basophils Absolute: 0.1 10*3/uL (ref 0.0–0.1)
Basophils Relative: 1 %
EOS PCT: 6 %
Eosinophils Absolute: 0.4 10*3/uL (ref 0.0–0.7)
HCT: 34.8 % — ABNORMAL LOW (ref 36.0–46.0)
Hemoglobin: 11.3 g/dL — ABNORMAL LOW (ref 12.0–15.0)
LYMPHS ABS: 2.3 10*3/uL (ref 0.7–4.0)
LYMPHS PCT: 33 %
MCH: 31 pg (ref 26.0–34.0)
MCHC: 32.5 g/dL (ref 30.0–36.0)
MCV: 95.3 fL (ref 78.0–100.0)
MONO ABS: 0.5 10*3/uL (ref 0.1–1.0)
MONOS PCT: 7 %
Neutro Abs: 3.8 10*3/uL (ref 1.7–7.7)
Neutrophils Relative %: 53 %
PLATELETS: 270 10*3/uL (ref 150–400)
RBC: 3.65 MIL/uL — ABNORMAL LOW (ref 3.87–5.11)
RDW: 14.3 % (ref 11.5–15.5)
WBC: 7.1 10*3/uL (ref 4.0–10.5)

## 2015-11-10 LAB — COMPREHENSIVE METABOLIC PANEL
ALT: 13 U/L — ABNORMAL LOW (ref 14–54)
AST: 17 U/L (ref 15–41)
Albumin: 3.3 g/dL — ABNORMAL LOW (ref 3.5–5.0)
Alkaline Phosphatase: 80 U/L (ref 38–126)
Anion gap: 8 (ref 5–15)
BUN: 9 mg/dL (ref 6–20)
CHLORIDE: 102 mmol/L (ref 101–111)
CO2: 28 mmol/L (ref 22–32)
CREATININE: 0.99 mg/dL (ref 0.44–1.00)
Calcium: 8.5 mg/dL — ABNORMAL LOW (ref 8.9–10.3)
GFR, EST NON AFRICAN AMERICAN: 59 mL/min — AB (ref >60)
Glucose, Bld: 90 mg/dL (ref 65–99)
POTASSIUM: 3.7 mmol/L (ref 3.5–5.1)
Sodium: 138 mmol/L (ref 135–145)
TOTAL PROTEIN: 5.4 g/dL — AB (ref 6.5–8.1)

## 2015-11-10 LAB — ACETAMINOPHEN LEVEL: Acetaminophen (Tylenol), Serum: 23 ug/mL (ref 10–30)

## 2015-11-10 LAB — SALICYLATE LEVEL: Salicylate Lvl: 19.8 mg/dL (ref 2.8–30.0)

## 2015-11-10 LAB — I-STAT TROPONIN, ED: Troponin i, poc: 0.01 ng/mL (ref 0.00–0.08)

## 2015-11-10 LAB — ETHANOL: Alcohol, Ethyl (B): <5 mg/dL (ref <5)

## 2015-11-10 MED ORDER — SODIUM CHLORIDE 0.9 % IV BOLUS (SEPSIS): 500.0000 mL | Freq: Once | INTRAVENOUS | Status: DC

## 2015-11-10 NOTE — Telephone Encounter (Signed)
I spoke with pt today and advised her to continue taking benzonatate as well as the OTC medications you advised

## 2015-11-10 NOTE — Telephone Encounter (Signed)
Pt was prescribed 60 tabs of tessalon pearles on 10/28/2015 - Using at absolute max dose she should not be out until 5/26 - this refill was requested on 5/17 so it is denied.

## 2015-11-10 NOTE — ED Notes (Signed)
Bed: [id number] Expected date:  Expected time:  Means of arrival:  Comments: Slurred speech/ altered

## 2015-11-10 NOTE — ED Provider Notes (Signed)
CSN: [id number]     Arrival date & time 11/10/15  2124 History  By signing my name below, I, Rayna Sexton, attest that this documentation has been prepared under the direction and in the presence of Orpah Greek, MD. Electronically Signed: Rayna Sexton, ED Scribe. 11/10/2015. 12:09 AM.   Chief Complaint  Patient presents with  . Altered Mental Status   The history is provided by the patient and a relative. No language interpreter was used.    HPI Comments: [name] is a 65 y.o. female who presents to the Emergency Department by EMS complaining of altered mental status onset earlier tonight. Her relative states that she began noticing she was "not herself" and "drifting around" and began slurring her speech. Her relative states she took 2x Xanax at 7:30 PM which is a nml dosage for her. Her relative notes that she has spent long periods of time outdoors in the heat gardening. Her relative states that her mother recently experienced a stroke and is in poor health and due to this has been experiencing elevated stress levels and is smoking more than nml. She denies any other associated symptoms at this time.    Past Medical History  Diagnosis Date  . Peptic ulcer disease     dr Oletta Lamas  . Acute duodenal ulcer with hemorrhage and perforation, with obstruction ([place])   . Barrett's esophagus   . Menopause   . GERD (gastroesophageal reflux disease)   . Asthma   . Chronic abdominal pain     narcotic dependence, dr gyarteng-dak at [facility]  . Narcotic abuse   . Anxiety     sees [name] NP at Dr. Radonna Ricker office  . Fx two ribs-open 08-24-11    left 4th and 5th  . Fx of fibula 07-28-11    left fibula, 2 places   . COPD (chronic obstructive pulmonary disease) ([place])   . Allergy   . Anemia   . History of blood transfusion     "related to OR; maybe when I perforated that ulcer"  . Lap Nissen + truncal vagotomy July 2008 05/13/2013  . LEG EDEMA 01/19/2008   Qualifier: Diagnosis of  By: [name] MD, [name]   . ACUTE DUODEN ULCER W/HEMORR PERF&OBSTRUCTION 06/26/2004    Qualifier: Diagnosis of  By: [name] MD, [name]   . FEVER, RECURRENT 08/11/2009    Qualifier: Diagnosis of  By: [name] MD, [name] MENOPAUSE 01/07/2007    Qualifier: Diagnosis of  By: [name], CMA, [place]    . Gastroparesis 06/03/2007    Qualifier: Diagnosis of  By: [name] MD, [name]   . GASTRIC OUTLET OBSTRUCTION 08/28/2007    In July 2008 she underwent laparoscopic enterolysis, Nissen fundoplication over a [id number] bougie, single pledgeted suture colosure of the hiatus and a 3 suture wrap.  Lap truncal vagotomy and a loop gastrojejunostomy was performed.  This was revised in August of 2009 to a roux en Y gastrojejujostomy.     . 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 Depression   . History of hiatal hernia   . Complication of anesthesia     pt states had too much xanax on board - agitated   . Anginal pain ([place])     "many many years ago"  . Exertional shortness of breath   . Pneumonia, organism unspecified 11/07/2010    Assoc with R Parapneumonic effusion 09/2010    - Tapped 10/05/10    -  CxR resolved 11/07/2010    . Headache     "monthly" (09/06/2014)  . Arthritis     "hips" (09/06/2014)  . Myocardial infarction ([place])   . S/P jejunostomy Feb 2016 07/26/2014   Past Surgical History  Procedure Laterality Date  . Esophagogastroduodenoscopy  12-29-09    dr qadeer at [facility], several gastric ulcers  . Laparoscopic nissen fundoplication    . Gastrojejunostomy  02-20-08    Roux en Y gastrojejunsotomy w 1 foot Roux limb  . Repair of perforated ulcer    . Biopsy thyroid  08-13-11    benign nodule, per Dr. Melida Quitter   . Esophagogastroduodenoscopy N/A 09/25/2012    Procedure: ESOPHAGOGASTRODUODENOSCOPY (EGD);  Surgeon: Beryle Beams, MD;  [facility];  Service: Endoscopy;  Laterality: N/A;  . Fracture surgery    . Tonsillectomy    . Cataract extraction w/  intraocular lens  implant, bilateral Bilateral   . Laparoscopic partial gastrectomy N/A 06/29/2013    Procedure: Gastrectomy and GJ Tube placement;  Surgeon: Pedro Earls, MD;  Location: [facility];  Service: General;  Laterality: N/A;  . Colonoscopy    . Gastrostomy N/A 07/26/2014    Procedure: LAPRASCOPIC ASSISTED OPEN PLACEMENT OF JEJUNOSTOMY TUBE;  Surgeon: Pedro Earls, MD;  Location: [facility];  Service: General;  Laterality: N/A;  . Orif hip fracture Bilateral     "pins in both of them"  . Dilation and curettage of uterus    . Hernia repair      "hiatal"  . Joint replacement     Family History  Problem Relation Age of Onset  . Cancer Mother     kidney, magliant tumor on face  . Stroke Mother   . Celiac disease Father   . Cancer Father     kidney and pancreatic   Social History  Substance Use Topics  . Smoking status: Current Every Day Smoker -- 2.00 packs/day for 50 years    Types: Cigarettes  . Smokeless tobacco: Never Used  . Alcohol Use: No   OB History    No data available     Review of Systems  Psychiatric/Behavioral: Positive for confusion and decreased concentration.  All other systems reviewed and are negative.   Allergies  Lithium; Celebrex; Morphine; Quetiapine; and Varenicline tartrate  Home Medications   Prior to Admission medications   Medication Sig Start Date End Date Taking? Authorizing Provider  albuterol (PROVENTIL HFA;VENTOLIN HFA) 108 (90 Base) MCG/ACT inhaler Inhale 2 puffs into the lungs every 4 (four) hours as needed (cough, shortness of breath or wheezing.). 08/01/15  Yes Shawnee Knapp, MD  ALPRAZolam [name]) 1 MG tablet Take 1 tablet (1 mg total) by mouth 3 (three) times daily as needed for anxiety. This is a 1 month supply 10/15/15  Yes Shawnee Knapp, MD  budesonide-formoterol [facility]) 160-4.5 MCG/ACT inhaler Inhale 2 puffs into the lungs 2 (two) times daily. 10/28/15  Yes Shawnee Knapp, MD  DULoxetine (CYMBALTA) 60 MG capsule Take 1 capsule (60 mg  total) by mouth daily. 10/28/15  Yes Shawnee Knapp, MD  HYDROcodone-acetaminophen (NORCO) 10-325 MG tablet Take 1-2 tablets by mouth every 6 (six) hours as needed for moderate pain. This is a 1 month supply. 10/28/15  Yes Shawnee Knapp, MD  ipratropium (ATROVENT) 0.03 % nasal spray Place 2 sprays into both nostrils 2 (two) times daily. 10/20/15  Yes Posey Boyer, MD  OLANZapine (ZYPREXA) 10 MG tablet Take 1 tablet (10 mg total)  by mouth at bedtime. 10/28/15  Yes Shawnee Knapp, MD  omeprazole (PRILOSEC) 40 MG capsule TAKE ONE CAPSULE BY MOUTH TWICE DAILY 10/10/15  Yes Shawnee Knapp, MD  Aspirin-Acetaminophen-Caffeine (GOODY HEADACHE PO) Take 1 packet by mouth 4 (four) times daily as needed (pain). Reported on 10/25/2015    Historical Provider, MD  benzonatate (TESSALON) 200 MG capsule Take 1 capsule (200 mg total) by mouth 3 (three) times daily as needed for cough. 10/28/15   Shawnee Knapp, MD  Calcium Citrate-Vitamin D (CALCIUM CITRATE CHEWY BITE) 500-500 MG-UNIT CHEW Chew 1 tablet by mouth 2 (two) times daily. Patient not taking: Reported on 11/10/2015 04/17/15   Shawnee Knapp, MD  feeding supplement (BOOST / RESOURCE BREEZE) LIQD Take 1 Container by mouth 3 (three) times daily between meals. 10/01/15   Shawnee Knapp, MD  fluticasone (FLONASE) 50 MCG/ACT nasal spray Place 2 sprays into both nostrils daily. 10/20/15   Posey Boyer, MD  guaiFENesin-codeine 100-10 MG/5ML syrup Take 10 mLs by mouth every 4 (four) hours as needed for cough. 10/15/15   Shawnee Knapp, MD  ipratropium-albuterol (DUONEB) 0.5-2.5 (3) MG/3ML SOLN Take 3 mLs by nebulization 4 (four) times daily -  before meals and at bedtime. 06/03/15   Robyn Haber, MD  ondansetron (ZOFRAN ODT) 4 MG disintegrating tablet Take 1-2 tablets (4-8 mg total) by mouth every 8 (eight) hours as needed for nausea or vomiting. 10/02/15   Shawnee Knapp, MD  UNABLE TO FIND Place 1 application into the nose once. Med Name: best Fit evaluation for oxygen, possibly simply go mini Patient not  taking: Reported on 11/10/2015 08/18/15   Mancel Bale, PA-C  UNABLE TO FIND Med Name: Best fit evaluation for oxygen - possible simply go mini 08/18/15   Mancel Bale, PA-C   BP 103/70 mmHg  Pulse 64  Temp(Src) 98.1 F (36.7 C) (Oral)  Resp 18  SpO2 92%    Physical Exam  Constitutional: She is oriented to person, place, and time. She appears well-developed and well-nourished. No distress.  HENT:  Head: Normocephalic and atraumatic.  Right Ear: Hearing normal.  Left Ear: Hearing normal.  Nose: Nose normal.  Mouth/Throat: Oropharynx is clear and moist and mucous membranes are normal.  Eyes: Conjunctivae and EOM are normal. Pupils are equal, round, and reactive to light.  Neck: Normal range of motion. Neck supple.  Cardiovascular: Regular rhythm, S1 normal and S2 normal.  Exam reveals no gallop and no friction rub.   No murmur heard. Pulmonary/Chest: Effort normal and breath sounds normal. No respiratory distress. She exhibits no tenderness.  Abdominal: Soft. Normal appearance and bowel sounds are normal. There is no hepatosplenomegaly. There is no tenderness. There is no rebound, no guarding, no tenderness at McBurney's point and negative Murphy's sign. No hernia.  Musculoskeletal: Normal range of motion.  Neurological: She is alert and oriented to person, place, and time. She has normal strength. No cranial nerve deficit or sensory deficit. Coordination normal. GCS eye subscore is 4. GCS verbal subscore is 5. GCS motor subscore is 6.  Sleepy and slightly disoriented  Skin: Skin is warm, dry and intact. No rash noted. No cyanosis.  Psychiatric: She has a normal mood and affect. Her speech is normal and behavior is normal. Thought content normal.  Nursing note and vitals reviewed.   ED Course  Procedures  DIAGNOSTIC STUDIES: Oxygen Saturation is 92% on RA, adequate by my interpretation.    COORDINATION OF CARE: 11:34 PM  Discussed next steps with pt. She verbalized understanding  and is agreeable with the plan.   Labs Review Labs Reviewed  CBC WITH DIFFERENTIAL/PLATELET - Abnormal; Notable for the following:    RBC 3.65 (*)    Hemoglobin 11.3 (*)    HCT 34.8 (*)    All other components within normal limits  COMPREHENSIVE METABOLIC PANEL - Abnormal; Notable for the following:    Calcium 8.5 (*)    Total Protein 5.4 (*)    Albumin 3.3 (*)    ALT 13 (*)    Total Bilirubin <0.1 (*)    GFR calc non Af Amer 59 (*)    All other components within normal limits  URINE RAPID DRUG SCREEN, HOSP PERFORMED - Abnormal; Notable for the following:    Opiates POSITIVE (*)    Benzodiazepines POSITIVE (*)    All other components within normal limits  ETHANOL  SALICYLATE LEVEL  ACETAMINOPHEN LEVEL  URINALYSIS, ROUTINE W REFLEX MICROSCOPIC (NOT AT [facility])  CK  TROPONIN I  I-STAT TROPOININ, ED    Imaging Review Ct Head Wo Contrast  11/10/2015  CLINICAL DATA:  Altered mental status. Slurred speech. Patient Xanax. EXAM: CT HEAD WITHOUT CONTRAST TECHNIQUE: Contiguous axial images were obtained from the base of the skull through the vertex without intravenous contrast. COMPARISON:  None. FINDINGS: Ventricles and sulci appear symmetrical. No ventricular dilatation. No mass effect or midline shift. No abnormal extra-axial fluid collections. Gray-white matter junctions are distinct. Basal cisterns are not effaced. No evidence of acute intracranial hemorrhage. No depressed skull fractures. Visualized paranasal sinuses and mastoid air cells are not opacified. Vascular calcifications. IMPRESSION: No acute intracranial abnormalities. Electronically Signed   By: Lucienne Capers M.D.   On: 11/10/2015 23:03   I have personally reviewed and evaluated these images and lab results as part of my medical decision-making.   EKG Interpretation   Date/Time:  Friday Nov 10 2015 22:16:19 EDT Ventricular Rate:  63 PR Interval:  162 QRS Duration: 71 QT Interval:  393 QTC Calculation: 402 R Axis:    70 Text Interpretation:  Sinus rhythm Ventricular premature complex Probable  left atrial enlargement No significant change since last tracing Confirmed  by Ladina Shutters  MD, Dannon Nguyenthi [phone]) on 11/10/2015 11:32:25 PM      MDM   Final diagnoses:  None  Mental status changes secondary to drug effect  Patient presents to the emergency department for evaluation of mental status changes. She is brought to the ER by ambulance after roommate called for assistance. Patient was very sedated at arrival. During the period of monitoring here in the ER she has become more awake and alert and is now active and walking in the ER without assistance. Reviewing her medical records reveals that her primary care physician has been concerned about the amount of intake of Xanax and hydrocodone she has had recently. This is likely because the patient's sedation. I did discuss with her the need for follow-up. Her doctor has also told her she needs to go to a rehabilitation facility. She was given resources for outpatient follow-up, as her workup is entirely normal and she is back to her baseline she is appropriate for discharge.  I personally performed the services described in this documentation, which was scribed in my presence. The recorded information has been reviewed and is accurate.    Orpah Greek, MD 11/11/15 [phone]

## 2015-11-10 NOTE — Telephone Encounter (Signed)
No, she has COPD and so will have a chronic cough.  I can send in [place] (which she prob still has at home) or she can use otc mucinex DM or Delsym.

## 2015-11-10 NOTE — Telephone Encounter (Signed)
Spoke with pt and she still has Benzonatate, advised her to continue taking that as well as trying the otc options suggested.

## 2015-11-10 NOTE — Telephone Encounter (Signed)
Attempted to call pt, left VM to call back

## 2015-11-10 NOTE — ED Notes (Addendum)
Pt transported from home with c/o AMS, room mate called due to feeling patient was "off". Pt took Xanax x 2 at 1930. EMS unable to complete neuro exam d/t pt sedation.   Pt states she took Xanax x 2 of her room mates because she is out, unsure of what milligram, pt states she is out of Norco 10 as well. Pt does have slurred speech at this time.

## 2015-11-11 LAB — CK: CK TOTAL: 95 U/L (ref 38–234)

## 2015-11-11 LAB — URINALYSIS, ROUTINE W REFLEX MICROSCOPIC
BILIRUBIN URINE: NEGATIVE
Glucose, UA: NEGATIVE mg/dL
Hgb urine dipstick: NEGATIVE
KETONES UR: NEGATIVE mg/dL
LEUKOCYTES UA: NEGATIVE
NITRITE: NEGATIVE
PH: 6 (ref 5.0–8.0)
PROTEIN: NEGATIVE mg/dL
Specific Gravity, Urine: 1.011 (ref 1.005–1.030)

## 2015-11-11 LAB — RAPID URINE DRUG SCREEN, HOSP PERFORMED
AMPHETAMINES: NOT DETECTED
Barbiturates: NOT DETECTED
Benzodiazepines: POSITIVE — AB
Cocaine: NOT DETECTED
OPIATES: POSITIVE — AB
TETRAHYDROCANNABINOL: NOT DETECTED

## 2015-11-11 LAB — TROPONIN I: Troponin I: <0.03 ng/mL (ref <0.031)

## 2015-11-11 NOTE — ED Notes (Signed)
Pt continues to come to the nurses station, confused, pt does not recall events of her visit

## 2015-11-11 NOTE — ED Notes (Signed)
Spoke with patients room mate, she will be here shortly to pick up patient

## 2015-11-11 NOTE — Discharge Instructions (Signed)
Sedative Ingestion An overdose is when more drugs are taken than recommended. The risk of serious problems from overdosing on any sedative depends on the amount of drug taken and whether it is mixed with other drugs or alcohol. The most common group of sedatives are benzodiazepines, including:  Lorazepam.  Flurazepam.  Triazolam.  Chlordiazepoxide.  Oxazepam.  Diazepam.  Alprazolam. Sedatives may be prescribed for insomnia, anxiety, muscle tension, and alcohol or drug withdrawal symptoms. SYMPTOMS A sedative overdose causes symptoms similar to alcohol intoxication. These include:  Loss of coordination.  Slurred speech.  Slowed breathing.  Poor judgment.  Memory loss.  Drowsiness.  Blackouts.  Coma. Taking too many sedatives can cause:  Respiratory depression.  Vomiting.  Dehydration.  Low blood pressure.  Death. HOME CARE INSTRUCTIONS  At this point, hospital care is not needed.  You are at an increased risk for injury when on sedative drugs, especially when you drive or operate machinery. It is very important that someone watches you closely for the next 24-48 hours and calls for emergency help if you have trouble breathing or cannot be awakened from sleep.  You may have a hangover after sedative ingestion. Get plenty of rest and drink increased amounts of non-alcoholic fluids.  A sedative ingestion is often a sign of a severe emotional state or depression. If you have been taking a sedative medicine regularly for a long time and stop suddenly, you may have withdrawal symptoms, including anxiety, agitation, headache, and more serious symptoms. Be sure to see your doctor or counselor for further treatment to address these emotional and physical issues. SEEK IMMEDIATE MEDICAL CARE IF:   You develop recurrent dizziness or weakness or you faint.  You have trouble breathing.  You have a seizure.   This information is not intended to replace advice given to  you by your health care provider. Make sure you discuss any questions you have with your health care provider.   Document Released: 07/18/2004 Document Revised: 09/02/2011 Document Reviewed: 11/30/2014 Elsevier Interactive Patient Education 2016 [employer]. Substance Abuse Treatment Programs  Intensive Outpatient Programs [facility]     [address], [place]       [facility] [place] [address] [place], [place]  [facility]     (Inpatient and outpatient)     [address]           [place] [phone] (Suboxone and Methadone)  [place], [place] [address]      [place] [phone] [place], [place]  [facility] [place] (Outpatient/Inpatient, [place])    (insurance only) [phone]             [facility] ([place]) [place], [place]     [facility]     [address]     [place], [place]       [facility] (for caregivers and family) [phone] [address] Kristeen [place]. [place], [place]      Residential Treatment Programs [facility]      [address] [place], [place] [address]  [phone]       [facility]  [address] [place],  [place] [address] [phone]  [facility]        [phone]       [facility] [phone]  [facility] ([place].)             [place], [place] or [place] of [place], [address] [phone]  [facility] [place]    [address]      [place], [place]       [facility] [address] [place], [place]  [place]   [address] [place], [place] [address]     [phone]      Admissions: 8am-3pm M-F  [facility] (RTS) [address] [place], [place]  [facility] Program: Residential Program [phone] Days)   [place], [place] or [phone]     ADATC: [facility] [place], [place] (Walk in Hours over the weekend or by referral)  [facility] [place], [place], [place] [address] [phone]  Crisis Mobile: [facility]:  [phone] (for crisis response 24 hours a day) [facility] Hotline:      [phone] Outpatient Psychiatry and Counseling  [facility]: Mobile Crisis Management 24 hours:  [phone]  [facility] of the [employer] sliding scale fee and walk in schedule: M-F 8am-12pm/1pm-3pm [place], [place] [address] [place], [place] [address] [phone]  [facility] ([facility] as [facility] [employer])- new patient walk-in appointments available Monday - Friday 8am -3pm.          [address] [place], [place] [address] [phone] or crisis line- [place] [facility]/ Intensive Outpatient Therapy Program [place], [place] [address] [place]      [phone] [address] [place], [place] [address]                 [place]   [facility] [phone]. [place], [place] [address]   [employer] of Care          [address] Johnette Abraham  [place], [place] [address]       [phone]  [place], [place], [place] [address] [phone]  [facility]    [address] [place]    [place],  [address]  [place], [place] [address]     [phone]       [facility] [place] [address]  [facility]     [phone] [address], [place], [place], [place] [address] [place]     [address]     [place], [place] [address]     [phone]       [facility] [address] [place], [place] [address] [phone] Resources for Temporary Residential Assistance/Crisis [facility] [place]) M-F 8am-3pm   [address], [place] [address]   [phone] Services include: laundry, barbering, support groups, case management, phone  & computer access, showers, AA/NA mtgs, mental health/substance abuse nurse, job skills class, disability information, VA assistance, spiritual classes, etc.   [facility]   [address] [place]     [place]              [employer] (women and children)       [place]. [place], [place] [address] [phone] [email or web address] for application and process Application Required  [facility]   [address]    [place] [address]     [phone]                    [place], [place] [address] [id number] application appt.) Application Required  [facility] (women only)    [address]     [place], [place] [address]     [phone]      Intake starts 6pm daily Need valid ID, SSC, & Police report [employer] [address] [place],  [place] [id number] Application Required  [employer] (men only)     [place].      [place], [place]       [place] (Pregnant women only) [address] [place], [place]  [facility]      [place] [address]i [address].      [place], [place] [address]     [phone]             [facility] [address] [place], [place] 90 day commitment/SA/Application process  [facility](men only)     [address]     [place], [place]       Check-in at [facility] of [facility] [address] [place], [place] [address] [phone] Men/Women/Women and Children must be there by 7 pm  [place], [place]

## 2015-11-11 NOTE — ED Notes (Signed)
Extended conversation with patient and room mate regarding sharing medications. Room mate educated that is it a crime to share Benzo's and Narcotics with anyone else. Pt educated on importance of taking medication as directed and reminded of Dr. Raul Del recommendation for rehab

## 2015-11-13 ENCOUNTER — Other Ambulatory Visit: Payer: Self-pay | Admitting: Family Medicine

## 2015-11-13 ENCOUNTER — Ambulatory Visit (INDEPENDENT_AMBULATORY_CARE_PROVIDER_SITE_OTHER): Payer: Medicare Other | Admitting: Family Medicine

## 2015-11-13 VITALS — BP 120/70 | HR 82 | Temp 98.2°F | Resp 18 | Ht 63.0 in | Wt 103.0 lb

## 2015-11-13 DIAGNOSIS — R5383 Other fatigue: Secondary | ICD-10-CM

## 2015-11-13 DIAGNOSIS — J449 Chronic obstructive pulmonary disease, unspecified: Secondary | ICD-10-CM | POA: Diagnosis not present

## 2015-11-13 DIAGNOSIS — R636 Underweight: Secondary | ICD-10-CM

## 2015-11-13 DIAGNOSIS — F4323 Adjustment disorder with mixed anxiety and depressed mood: Secondary | ICD-10-CM | POA: Diagnosis not present

## 2015-11-13 MED ORDER — BOOST / RESOURCE BREEZE PO LIQD: 1.0000 | Freq: Three times a day (TID) | ORAL | Status: DC

## 2015-11-13 MED ORDER — ALPRAZOLAM 1 MG PO TABS: 1.0000 mg | ORAL_TABLET | Freq: Every evening | ORAL | Status: DC | PRN

## 2015-11-13 MED ORDER — MUPIROCIN 2 % EX OINT: 1.0000 "application " | TOPICAL_OINTMENT | Freq: Three times a day (TID) | CUTANEOUS | Status: DC

## 2015-11-13 MED ORDER — GUAIFENESIN-CODEINE 100-10 MG/5ML PO SOLN: 10.0000 mL | ORAL | Status: DC | PRN

## 2015-11-13 NOTE — Patient Instructions (Addendum)
[place] is now offering annual lung cancer screening by low-dose CT scan.  This is covered for qualifying patients and your insurance will be checked before the procedure.  Call the lung cancer screening nurse navigators at [phone] to learn more about this and get scheduled.  Decrease your xanax to 1 tab at night and then I wrote for 2 teaspoons of cough syrup 4 times a day. If you are doing well, call in 2 weeks for refill of your pain medicine and I will see you back in 1 month for repeat labs.  If you have problems, of course come back in.  Meds ordered this encounter  Medications  . feeding supplement (BOOST / RESOURCE BREEZE) LIQD    Sig: Take 1 Container by mouth 3 (three) times daily between meals.    Dispense:  90 Container    Refill:  [place] to substitute a similar product if cost for pt is better and change is acceptable to pt  . ALPRAZolam (XANAX) 1 MG tablet    Sig: Take 1 tablet (1 mg total) by mouth at bedtime as needed for anxiety. This is a 1 month supply    Dispense:  30 tablet    Refill:  0  . guaiFENesin-codeine 100-10 MG/5ML syrup    Sig: Take 10 mLs by mouth every 4 (four) hours as needed for cough.    Dispense:  500 mL    Refill:  0     IF you received an x-ray today, you will receive an invoice from [facility]. Please contact [facility] at [phone] with questions or concerns regarding your invoice.   IF you received labwork today, you will receive an invoice from [employer]. Please contact [facility] at [phone] with questions or concerns regarding your invoice.   Our billing staff will not be able to assist you with questions regarding bills from these companies.  You will be contacted with the lab results as soon as they are available. The fastest way to get your results is to activate your My Chart account. Instructions are located on the last page of this paperwork. If you have not heard from [place]  regarding the results in 2 weeks, please contact this office.    Chronic Obstructive Pulmonary Disease Chronic obstructive pulmonary disease (COPD) is a common lung condition in which airflow from the lungs is limited. COPD is a general term that can be used to describe many different lung problems that limit airflow, including both chronic bronchitis and emphysema. If you have COPD, your lung function will probably never return to normal, but there are measures you can take to improve lung function and make yourself feel better. CAUSES   Smoking (common).  Exposure to secondhand smoke.  Genetic problems.  Chronic inflammatory lung diseases or recurrent infections. SYMPTOMS  Shortness of breath, especially with physical activity.  Deep, persistent (chronic) cough with a large amount of thick mucus.  Wheezing.  Rapid breaths (tachypnea).  Gray or bluish discoloration (cyanosis) of the skin, especially in your fingers, toes, or lips.  Fatigue.  Weight loss.  Frequent infections or episodes when breathing symptoms become much worse (exacerbations).  Chest tightness. DIAGNOSIS Your health care provider will take a medical history and perform a physical examination to diagnose COPD. Additional tests for COPD may include:  Lung (pulmonary) function tests.  Chest X-ray.  CT scan.  Blood tests. TREATMENT  Treatment for COPD may include:  Inhaler and nebulizer medicines.  These help manage the symptoms of COPD and make your breathing more comfortable.  Supplemental oxygen. Supplemental oxygen is only helpful if you have a low oxygen level in your blood.  Exercise and physical activity. These are beneficial for nearly all people with COPD.  Lung surgery or transplant.  Nutrition therapy to gain weight, if you are underweight.  Pulmonary rehabilitation. This may involve working with a team of health care providers and specialists, such as respiratory, occupational, and  physical therapists. HOME CARE INSTRUCTIONS  Take all medicines (inhaled or pills) as directed by your health care provider.  Avoid over-the-counter medicines or cough syrups that dry up your airway (such as antihistamines) and slow down the elimination of secretions unless instructed otherwise by your health care provider.  If you are a smoker, the most important thing that you can do is stop smoking. Continuing to smoke will cause further lung damage and breathing trouble. Ask your health care provider for help with quitting smoking. He or she can direct you to community resources or hospitals that provide support.  Avoid exposure to irritants such as smoke, chemicals, and fumes that aggravate your breathing.  Use oxygen therapy and pulmonary rehabilitation if directed by your health care provider. If you require home oxygen therapy, ask your health care provider whether you should purchase a pulse oximeter to measure your oxygen level at home.  Avoid contact with individuals who have a contagious illness.  Avoid extreme temperature and humidity changes.  Eat healthy foods. Eating smaller, more frequent meals and resting before meals may help you maintain your strength.  Stay active, but balance activity with periods of rest. Exercise and physical activity will help you maintain your ability to do things you want to do.  Preventing infection and hospitalization is very important when you have COPD. Make sure to receive all the vaccines your health care provider recommends, especially the pneumococcal and influenza vaccines. Ask your health care provider whether you need a pneumonia vaccine.  Learn and use relaxation techniques to manage stress.  Learn and use controlled breathing techniques as directed by your health care provider. Controlled breathing techniques include:  Pursed lip breathing. Start by breathing in (inhaling) through your nose for 1 second. Then, purse your lips as if  you were going to whistle and breathe out (exhale) through the pursed lips for 2 seconds.  Diaphragmatic breathing. Start by putting one hand on your abdomen just above your waist. Inhale slowly through your nose. The hand on your abdomen should move out. Then purse your lips and exhale slowly. You should be able to feel the hand on your abdomen moving in as you exhale.  Learn and use controlled coughing to clear mucus from your lungs. Controlled coughing is a series of short, progressive coughs. The steps of controlled coughing are: 1. Lean your head slightly forward. 2. Breathe in deeply using diaphragmatic breathing. 3. Try to hold your breath for 3 seconds. 4. Keep your mouth slightly open while coughing twice. 5. Spit any mucus out into a tissue. 6. Rest and repeat the steps once or twice as needed. SEEK MEDICAL CARE IF:  You are coughing up more mucus than usual.  There is a change in the color or thickness of your mucus.  Your breathing is more labored than usual.  Your breathing is faster than usual. SEEK IMMEDIATE MEDICAL CARE IF:  You have shortness of breath while you are resting.  You have shortness of breath that prevents you from:  Being able to talk.  Performing your usual physical activities.  You have chest pain lasting longer than 5 minutes.  Your skin color is more cyanotic than usual.  You measure low oxygen saturations for longer than 5 minutes with a pulse oximeter. MAKE SURE YOU:  Understand these instructions.  Will watch your condition.  Will get help right away if you are not doing well or get worse.   This information is not intended to replace advice given to you by your health care provider. Make sure you discuss any questions you have with your health care provider.   Document Released: 03/20/2005 Document Revised: 07/01/2014 Document Reviewed: 02/04/2013 Elsevier Interactive Patient Education [employer].

## 2015-11-13 NOTE — Progress Notes (Signed)
Subjective:  By signing my name below, I, [name], attest that this documentation has been prepared under the direction and in the presence of [name], MD. Electronically Signed: Moises [name], [place]. 11/13/2015 , 11:54 AM .  Patient was seen in Room 9 .   Patient ID: [name], female    DOB: [birth year], 65 y.o.   MRN: [id number] Chief Complaint  Patient presents with  . Bronchitis    1 week   HPI [name] is a 65 y.o. female who presents to [facility] for follow up.  Patient has been complaining of respiratory illness and increased congestion and cough. This has been fairly continuously ongoing since the middle of January. At her last visit 2 weeks ago, I reinforced patient that I suspect these past 4 months of her new symptoms are her new baseline for her COPD due to continued tobacco use. Patient did have home oxygen but at that point, I recommended she discontinue her oxygen since she's continuing smoking in her house; O2 with ambulation on room air was normal. Patient is not using inhaler, encourage to restart symbicort. Spirometry showed very severe obstruction with FEC 41%, FEV1 24%, and FEV / FVC of 44%. She is also complaining of worsening depression, encouraged to restart zyprexa.   She was seen in ER 3 days ago (Friday evening). She was brought in after she began "acting different" with slurred speech and non-purposeful behavior. Stated she had taken her normal medication doses but spent a long time outside that day and smoking more. Patient was noted to be slightly disoriented and sedated. UDS was consistent with prescribed medications, UA and troponin were normal. CBC showed slight anemia, no significant change from baseline. Calcium and protein albumin were decreased, consistent with prior challenges of malnutrition. Alcohol negative.   Patient received a phone call her mother had a stroke 1-2 days prior to her ER visit. She wasn't sleeping well and had taken something  for sleep, which caused her to "talk funny". Her roommate called EMS when this occurred. She had decreased appetite after receiving her the call about her mother's stroke. She's been taking Breeze nutritional supplement. She denies any SI/HI. She's still taking the zyprexa at night.   She's still coughing even with decreased smoking, and it is "driving her roommate nuts". She mentions still using both of her inhalers, albuterol and symbicort, bid. She's also using a nasal spray, and taking tessalon perles. She informs taking her zofran daily. She has regular BM daily. She denies using nebulizer.   She also notes a small abrasion over the anterior of her right foot due to door hitting it. There is slight swelling in her right foot.   Past Medical History  Diagnosis Date  . Peptic ulcer disease     dr Oletta Lamas  . Acute duodenal ulcer with hemorrhage and perforation, with obstruction ([place])   . Barrett's esophagus   . Menopause   . GERD (gastroesophageal reflux disease)   . Asthma   . Chronic abdominal pain     narcotic dependence, dr gyarteng-dak at [facility]  . Narcotic abuse   . Anxiety     sees [name] NP at Dr. Radonna Ricker office  . Fx two ribs-open 08-24-11    left 4th and 5th  . Fx of fibula 07-28-11    left fibula, 2 places   . COPD (chronic obstructive pulmonary disease) ([place])   . Allergy   . Anemia   . History  of [name] transfusion     "related to OR; maybe when I perforated that ulcer"  . Lap Nissen + truncal vagotomy July 2008 05/13/2013  . LEG EDEMA 01/19/2008    Qualifier: Diagnosis of  By: [name] MD, [name]   . ACUTE DUODEN ULCER W/HEMORR PERF&OBSTRUCTION 06/26/2004    Qualifier: Diagnosis of  By: [name] MD, [name]   . FEVER, RECURRENT 08/11/2009    Qualifier: Diagnosis of  By: [name] MD, [name] MENOPAUSE 01/07/2007    Qualifier: Diagnosis of  By: [name], CMA, [place]    . Gastroparesis 06/03/2007    Qualifier: Diagnosis of  By: [name] MD, [name]   . GASTRIC  OUTLET OBSTRUCTION 08/28/2007    In July 2008 she underwent laparoscopic enterolysis, Nissen fundoplication over a [id number] bougie, single pledgeted suture colosure of the hiatus and a 3 suture wrap.  Lap truncal vagotomy and a loop gastrojejunostomy was performed.  This was revised in August of 2009 to a roux en Y gastrojejujostomy.     . 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 Depression   . History of hiatal hernia   . Complication of anesthesia     pt states had too much xanax on board - agitated   . Anginal pain ([place])     "many many years ago"  . Exertional shortness of breath   . Pneumonia, organism unspecified 11/07/2010    Assoc with R Parapneumonic effusion 09/2010    - Tapped 10/05/10    - CxR resolved 11/07/2010    . Headache     "monthly" (09/06/2014)  . Arthritis     "hips" (09/06/2014)  . Myocardial infarction ([place])   . 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 Authorizing Provider  albuterol (PROVENTIL HFA;VENTOLIN HFA) 108 (90 Base) MCG/ACT inhaler Inhale 2 puffs into the lungs every 4 (four) hours as needed (cough, shortness of breath or wheezing.). 08/01/15  Yes Shawnee Knapp, MD  ALPRAZolam [name]) 1 MG tablet Take 1 tablet (1 mg total) by mouth 3 (three) times daily as needed for anxiety. This is a 1 month supply 10/15/15  Yes Shawnee Knapp, MD  Aspirin-Acetaminophen-Caffeine (GOODY HEADACHE PO) Take 1 packet by mouth 4 (four) times daily as needed (pain). Reported on 10/25/2015   Yes Historical Provider, MD  benzonatate (TESSALON) 200 MG capsule Take 1 capsule (200 mg total) by mouth 3 (three) times daily as needed for cough. 10/28/15  Yes Shawnee Knapp, MD  budesonide-formoterol [facility]) 160-4.5 MCG/ACT inhaler Inhale 2 puffs into the lungs 2 (two) times daily. 10/28/15  Yes Shawnee Knapp, MD  Calcium Citrate-Vitamin D (CALCIUM CITRATE CHEWY BITE) 500-500 MG-UNIT CHEW Chew 1 tablet by mouth 2 (two) times  daily. 04/17/15  Yes Shawnee Knapp, MD  DULoxetine (CYMBALTA) 60 MG capsule Take 1 capsule (60 mg total) by mouth daily. 10/28/15  Yes Shawnee Knapp, MD  feeding supplement (BOOST / RESOURCE BREEZE) LIQD Take 1 Container by mouth 3 (three) times daily between meals. 10/01/15  Yes Shawnee Knapp, MD  fluticasone (FLONASE) 50 MCG/ACT nasal spray Place 2 sprays into both nostrils daily. 10/20/15  Yes Posey Boyer, MD  guaiFENesin-codeine 100-10 MG/5ML syrup Take 10 mLs by mouth every 4 (four) hours as needed for cough. 10/15/15  Yes Shawnee Knapp, MD  HYDROcodone-acetaminophen [facility]) 10-325 MG tablet Take 1-2 tablets by mouth every  6 (six) hours as needed for moderate pain. This is a 1 month supply. 10/28/15  Yes Shawnee Knapp, MD  ipratropium (ATROVENT) 0.03 % nasal spray Place 2 sprays into both nostrils 2 (two) times daily. 10/20/15  Yes Posey Boyer, MD  ipratropium-albuterol (DUONEB) 0.5-2.5 (3) MG/3ML SOLN Take 3 mLs by nebulization 4 (four) times daily -  before meals and at bedtime. 06/03/15  Yes Robyn Haber, MD  OLANZapine (ZYPREXA) 10 MG tablet Take 1 tablet (10 mg total) by mouth at bedtime. 10/28/15  Yes Shawnee Knapp, MD  omeprazole (PRILOSEC) 40 MG capsule TAKE ONE CAPSULE BY MOUTH TWICE DAILY 10/10/15  Yes Shawnee Knapp, MD  ondansetron (ZOFRAN ODT) 4 MG disintegrating tablet Take 1-2 tablets (4-8 mg total) by mouth every 8 (eight) hours as needed for nausea or vomiting. 10/02/15  Yes Shawnee Knapp, MD  UNABLE TO FIND Place 1 application into the nose once. Med Name: best Fit evaluation for oxygen, possibly simply go mini 08/18/15  Yes Mancel Bale, PA-C  UNABLE TO FIND Med Name: Best fit evaluation for oxygen - possible simply go mini 08/18/15  Yes Mancel Bale, PA-C   Allergies  Allergen Reactions  . Lithium Nausea And Vomiting  . Celebrex [Celecoxib] Other (See Comments)    History of bleeding ulcers  . Morphine Itching  . Quetiapine Other (See Comments)     tardive dyskinesia  . Varenicline Tartrate Other  (See Comments)    hallucinations    Review of Systems  Constitutional: Positive for appetite change. Negative for fever, chills and fatigue.  Respiratory: Positive for cough. Negative for shortness of breath and wheezing.   Gastrointestinal: Negative for nausea, vomiting, diarrhea and constipation.  Skin: Positive for wound. Negative for rash.  Psychiatric/Behavioral: Negative for suicidal ideas and self-injury.      Objective:   Physical Exam  Constitutional: She is oriented to person, place, and time. She appears well-developed and well-nourished. No distress.  HENT:  Head: Normocephalic and atraumatic.  Eyes: EOM are normal. Pupils are equal, round, and reactive to light.  Neck: Neck supple. No thyromegaly present.  Cardiovascular: Normal rate, regular rhythm, S1 normal, S2 normal and normal heart sounds.   Pulmonary/Chest: Effort normal and breath sounds normal. No respiratory distress.  Musculoskeletal: Normal range of motion.  Neurological: She is alert and oriented to person, place, and time.  Skin: Skin is warm and dry.  1cm anterior midline abrasion with some surrounding erythema, and 2+ edema extending to mid calf  Psychiatric: She has a normal mood and affect. Her behavior is normal.  Nursing note and vitals reviewed.  BP 120/70 mmHg  Pulse 82  Temp(Src) 98.2 F (36.8 C) (Oral)  Resp 18  Ht 5\' 3"  (1.6 m)  Wt 103 lb (46.72 kg)  BMI 18.25 kg/m2  SpO2 97%   Wt Readings from Last 3 Encounters:  11/13/15 103 lb (46.72 kg)  10/28/15 99 lb 6 oz (45.076 kg)  10/25/15 101 lb (45.813 kg)      Assessment & Plan:   1. Lethargic   2. Underweight   3. Adjustment disorder with mixed anxiety and depressed mood - pt needs to wean down/off xanax since recent unintentional od for which she had to be seen in the ER. Decrease xanax 1mg  to qhs from tid - pt consents to comply with this this in exchange for cough syrup  4. COPD, severe ([place]) - Pink puffer; # to call to arrange  for screening  lung CT - pt adament that codeine cough syrup sig helps despite the fact that she is already on oxycodone - agrees to go off xanax if I will prescribe her additional codeine cough syrup in times of copd flairs.   Ok to call in 2 wks for her reg hydrocodone refill if she is doing well.  Recheck in 1 mo  Orders Placed This Encounter  Procedures  . Ambulatory referral to Pulmonology    Referral Priority:  Routine    Referral Type:  Consultation    Referral Reason:  Specialty Services Required    Requested Specialty:  Pulmonary Disease    Number of Visits Requested:  1    Meds ordered this encounter  Medications  . feeding supplement (BOOST / RESOURCE BREEZE) LIQD    Sig: Take 1 Container by mouth 3 (three) times daily between meals.    Dispense:  90 Container    Refill:  [place] to substitute a similar product if cost for pt is better and change is acceptable to pt  . ALPRAZolam (XANAX) 1 MG tablet    Sig: Take 1 tablet (1 mg total) by mouth at bedtime as needed for anxiety. This is a 1 month supply    Dispense:  30 tablet    Refill:  0  . guaiFENesin-codeine 100-10 MG/5ML syrup    Sig: Take 10 mLs by mouth every 4 (four) hours as needed for cough.    Dispense:  500 mL    Refill:  0  . mupirocin ointment (BACTROBAN) 2 %    Sig: Apply 1 application topically 3 (three) times daily.    Dispense:  30 g    Refill:  1   Over 40 min spent in face-to-face evaluation of and consultation with patient and coordination of care.  Over 50% of this time was spent counseling this patient.  I personally performed the services described in this documentation, which was scribed in my presence. The recorded information has been reviewed and considered, and addended by me as needed.   [name], M.D.  [facility] [address] [place], [place] [address] [phone] phone [phone] fax  11/24/2015 11:29 PM

## 2015-11-16 DIAGNOSIS — J449 Chronic obstructive pulmonary disease, unspecified: Secondary | ICD-10-CM | POA: Diagnosis not present

## 2015-11-19 DIAGNOSIS — J449 Chronic obstructive pulmonary disease, unspecified: Secondary | ICD-10-CM | POA: Diagnosis not present

## 2015-11-25 ENCOUNTER — Ambulatory Visit (INDEPENDENT_AMBULATORY_CARE_PROVIDER_SITE_OTHER): Payer: Medicare Other | Admitting: Family Medicine

## 2015-11-25 ENCOUNTER — Telehealth: Payer: Self-pay | Admitting: Radiology

## 2015-11-25 VITALS — BP 108/70 | HR 78 | Temp 98.7°F | Resp 16 | Ht 63.0 in | Wt 100.2 lb

## 2015-11-25 DIAGNOSIS — G894 Chronic pain syndrome: Secondary | ICD-10-CM | POA: Diagnosis not present

## 2015-11-25 MED ORDER — HYDROCODONE-ACETAMINOPHEN 10-325 MG PO TABS: 1.0000 | ORAL_TABLET | Freq: Four times a day (QID) | ORAL | Status: DC | PRN

## 2015-11-25 NOTE — Telephone Encounter (Signed)
Yes okay. Also please let [place] know that we are now going to give her a 1 wk supply at a time so I will be writing 4 rxs for each month for her to keep on file and fill with #60 hydrocodone 10mg  every Saturday in effort to prevent med diversion (freq loosing of meds). Please let me know if they have a problem with this

## 2015-11-25 NOTE — Telephone Encounter (Signed)
Patient presented to pharmacy with her Rx for hydrocodone it is 7 days early, will you approve the early fill or should she wait until it is due ?

## 2015-11-25 NOTE — Patient Instructions (Signed)
     IF you received an x-ray today, you will receive an invoice from [facility]. Please contact  Radiology at [phone] with questions or concerns regarding your invoice.   IF you received labwork today, you will receive an invoice from [facility]. Please contact [facility] at [phone] with questions or concerns regarding your invoice.   Our billing staff will not be able to assist you with questions regarding bills from these companies.  You will be contacted with the lab results as soon as they are available. The fastest way to get your results is to activate your My Chart account. Instructions are located on the last page of this paperwork. If you have not heard from us regarding the results in 2 weeks, please contact this office.      

## 2015-11-25 NOTE — Telephone Encounter (Signed)
Discussed with Dr Brigitte Pulse ok for patient to get this medication early, situation was discussed in detail today during office  Discussed with [facility] as well
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
## 2015-11-29 DIAGNOSIS — J449 Chronic obstructive pulmonary disease, unspecified: Secondary | ICD-10-CM | POA: Diagnosis not present

## 2015-12-01 ENCOUNTER — Ambulatory Visit: Payer: Self-pay

## 2015-12-02 ENCOUNTER — Telehealth: Payer: Self-pay

## 2015-12-02 DIAGNOSIS — G894 Chronic pain syndrome: Secondary | ICD-10-CM

## 2015-12-02 MED ORDER — HYDROCODONE-ACETAMINOPHEN 10-325 MG PO TABS: 1.0000 | ORAL_TABLET | Freq: Four times a day (QID) | ORAL | Status: DC | PRN

## 2015-12-02 NOTE — Telephone Encounter (Signed)
Patient is calling to request a refill for hydrocodone. Patient states that she was prescribed half a prescription and was advised to call back for the other half. Please call when ready!  [phone]

## 2015-12-02 NOTE — Telephone Encounter (Signed)
Patient also would like to add Xanax to the medication refill.

## 2015-12-02 NOTE — Telephone Encounter (Signed)
No xanax refills, she just filled #30 which is a 1 mo supply on 6/3 - 7 d ago - according to [facility]. I informed pt of this - she is disappointed and still wants them but understands that I am not going to refill them this early. [facility] has hydrocodone for this week on file - we checked with them so she will go there. Have put hydrocodone refills for next wk 6/17 and for the following week 6/24 at front for pt to pick up once per week.

## 2015-12-07 ENCOUNTER — Ambulatory Visit (INDEPENDENT_AMBULATORY_CARE_PROVIDER_SITE_OTHER): Payer: Medicare Other | Admitting: Family Medicine

## 2015-12-07 VITALS — BP 110/78 | HR 82 | Temp 98.4°F | Resp 15 | Ht 63.0 in | Wt 100.0 lb

## 2015-12-07 DIAGNOSIS — G894 Chronic pain syndrome: Secondary | ICD-10-CM

## 2015-12-07 DIAGNOSIS — J441 Chronic obstructive pulmonary disease with (acute) exacerbation: Secondary | ICD-10-CM

## 2015-12-07 MED ORDER — CEFTRIAXONE SODIUM 1 G IJ SOLR
1.0000 g | Freq: Once | INTRAMUSCULAR | Status: AC
  Administered 2015-12-07: 1 g via INTRAMUSCULAR

## 2015-12-07 MED ORDER — HYDROCODONE-ACETAMINOPHEN 10-325 MG PO TABS: 1.0000 | ORAL_TABLET | Freq: Four times a day (QID) | ORAL | Status: DC | PRN

## 2015-12-07 MED ORDER — PREDNISONE 20 MG PO TABS: ORAL_TABLET | ORAL | Status: DC

## 2015-12-07 MED ORDER — LEVOFLOXACIN 500 MG PO TABS: 500.0000 mg | ORAL_TABLET | Freq: Every day | ORAL | Status: DC

## 2015-12-07 NOTE — Progress Notes (Signed)
65 yo woman with end stage COPD who is oxygen dependent at home.  She's been having stuffy nose, fever, increasing dyspnea, occasionally productive cough.  Last pneumonia was three years ago.   Objective:BP 110/78 mmHg  Pulse 82  Temp(Src) 98.4 F (36.9 C) (Oral)  Resp 15  Ht 5\' 3"  (1.6 m)  Wt 100 lb (45.36 kg)  BMI 17.72 kg/m2  SpO2 97% HEENT: unremarkable Chest:  Few rales left base Heart: reg, no murmur Skin:  Pink nails  Assessment:  COPD with acute exacerbation.  I agreed to give her 20 pain meds until she can get in with Dr. Brigitte Pulse.  Plan:  COPD exacerbation ([place]) - Plan: cefTRIAXone (ROCEPHIN) injection 1 g, levofloxacin (LEVAQUIN) 500 MG tablet, predniSONE (DELTASONE) 20 MG tablet  Chronic pain syndrome - Plan: HYDROcodone-acetaminophen (NORCO) 10-325 MG tablet  Recheck if symptoms persist or worsen. If the fever goes up tonight with increasing shortness of breath, patient directed to emergency department   Robyn Haber, MD

## 2015-12-07 NOTE — Patient Instructions (Signed)
     IF you received an x-ray today, you will receive an invoice from [facility]. Please contact [facility] at [phone] with questions or concerns regarding your invoice.   IF you received labwork today, you will receive an invoice from [facility]. Please contact [facility] at [phone] with questions or concerns regarding your invoice.   Our billing staff will not be able to assist you with questions regarding bills from these companies.  You will be contacted with the lab results as soon as they are available. The fastest way to get your results is to activate your My Chart account. Instructions are located on the last page of this paperwork. If you have not heard from us regarding the results in 2 weeks, please contact this office.      

## 2015-12-08 ENCOUNTER — Ambulatory Visit (INDEPENDENT_AMBULATORY_CARE_PROVIDER_SITE_OTHER): Payer: Medicare Other | Admitting: Family Medicine

## 2015-12-08 VITALS — BP 110/82 | HR 72 | Temp 98.9°F | Resp 16 | Ht 63.5 in | Wt 98.4 lb

## 2015-12-08 DIAGNOSIS — J069 Acute upper respiratory infection, unspecified: Secondary | ICD-10-CM | POA: Diagnosis not present

## 2015-12-08 MED ORDER — GUAIFENESIN-CODEINE 100-10 MG/5ML PO SOLN: 10.0000 mL | ORAL | Status: DC | PRN

## 2015-12-08 NOTE — Patient Instructions (Addendum)
IF you received an x-ray today, you will receive an invoice from [facility]. Please contact [facility] at [phone] with questions or concerns regarding your invoice.   IF you received labwork today, you will receive an invoice from [facility]. Please contact [facility] at [phone] with questions or concerns regarding your invoice.   Our billing staff will not be able to assist you with questions regarding bills from these companies.  You will be contacted with the lab results as soon as they are available. The fastest way to get your results is to activate your My Chart account. Instructions are located on the last page of this paperwork. If you have not heard from us regarding the results in 2 weeks, please contact this office.   Upper Respiratory Infection, Adult Most upper respiratory infections (URIs) are a viral infection of the air passages leading to the lungs. A URI affects the nose, throat, and upper air passages. The most common type of URI is nasopharyngitis and is typically referred to as "the common cold." URIs run their course and usually go away on their own. Most of the time, a URI does not require medical attention, but sometimes a bacterial infection in the upper airways can follow a viral infection. This is called a secondary infection. Sinus and middle ear infections are common types of secondary upper respiratory infections. Bacterial pneumonia can also complicate a URI. A URI can worsen asthma and chronic obstructive pulmonary disease (COPD). Sometimes, these complications can require emergency medical care and may be life threatening.  CAUSES Almost all URIs are caused by viruses. A virus is a type of germ and can spread from one person to another.  RISKS FACTORS You may be at risk for a URI if:   You smoke.   You have chronic heart or lung disease.  You have a weakened defense (immune) system.   You are very young  or very old.   You have nasal allergies or asthma.  You work in crowded or poorly ventilated areas.  You work in health care facilities or schools. SIGNS AND SYMPTOMS  Symptoms typically develop 2-3 days after you come in contact with a cold virus. Most viral URIs last 7-10 days. However, viral URIs from the influenza virus (flu virus) can last 14-18 days and are typically more severe. Symptoms may include:   Runny or stuffy (congested) nose.   Sneezing.   Cough.   Sore throat.   Headache.   Fatigue.   Fever.   Loss of appetite.   Pain in your forehead, behind your eyes, and over your cheekbones (sinus pain).  Muscle aches.  DIAGNOSIS  Your health care provider may diagnose a URI by:  Physical exam.  Tests to check that your symptoms are not due to another condition such as:  Strep throat.  Sinusitis.  Pneumonia.  Asthma. TREATMENT  A URI goes away on its own with time. It cannot be cured with medicines, but medicines may be prescribed or recommended to relieve symptoms. Medicines may help:  Reduce your fever.  Reduce your cough.  Relieve nasal congestion. HOME CARE INSTRUCTIONS   Take medicines only as directed by your health care provider.   Gargle warm saltwater or take cough drops to comfort your throat as directed by your health care provider.  Use a warm mist humidifier or inhale steam from a shower to increase air moisture. This may make it easier to breathe.  Drink enough fluid to keep your   urine clear or pale yellow.   Eat soups and other clear broths and maintain good nutrition.   Rest as needed.   Return to work when your temperature has returned to normal or as your health care provider advises. You may need to stay home longer to avoid infecting others. You can also use a face mask and careful hand washing to prevent spread of the virus.  Increase the usage of your inhaler if you have asthma.   Do not use any tobacco  products, including cigarettes, chewing tobacco, or electronic cigarettes. If you need help quitting, ask your health care provider. PREVENTION  The best way to protect yourself from getting a cold is to practice good hygiene.   Avoid oral or hand contact with people with cold symptoms.   Wash your hands often if contact occurs.  There is no clear evidence that vitamin C, vitamin E, echinacea, or exercise reduces the chance of developing a cold. However, it is always recommended to get plenty of rest, exercise, and practice good nutrition.  SEEK MEDICAL CARE IF:   You are getting worse rather than better.   Your symptoms are not controlled by medicine.   You have chills.  You have worsening shortness of breath.  You have brown or red mucus.  You have yellow or brown nasal discharge.  You have pain in your face, especially when you bend forward.  You have a fever.  You have swollen neck glands.  You have pain while swallowing.  You have white areas in the back of your throat. SEEK IMMEDIATE MEDICAL CARE IF:   You have severe or persistent:  Headache.  Ear pain.  Sinus pain.  Chest pain.  You have chronic lung disease and any of the following:  Wheezing.  Prolonged cough.  Coughing up blood.  A change in your usual mucus.  You have a stiff neck.  You have changes in your:  Vision.  Hearing.  Thinking.  Mood. MAKE SURE YOU:   Understand these instructions.  Will watch your condition.  Will get help right away if you are not doing well or get worse.   This information is not intended to replace advice given to you by your health care provider. Make sure you discuss any questions you have with your health care provider.   Document Released: 12/04/2000 Document Revised: 10/25/2014 Document Reviewed: 09/15/2013 Elsevier Interactive Patient Education 2016 [employer]  

## 2015-12-08 NOTE — Progress Notes (Signed)
Subjective:  By signing my name below, I, [name], attest that this documentation has been prepared under the direction and in the presence of [name] , MD.  Electronically Signed: [name], ED Scribe. 12/08/2015. 8:52 AM.  Patient ID: [name], female    DOB: 25-Mar-1951, 65 y.o.   MRN: [id number]  HPI   Chief Complaint  Patient presents with  . Cough    clear congestion, 3 days  . achy all over    x 3 days  . no appetite    HPI Comments: [name] is a 65 y.o. female who presents to the [facility] complaining of cough. Pt was seen yesterday by Dr. Joseph Art. She received an injection of rocephin, which she states helped temporarily. She was unable to get Levaquin, which was also prescribed yesterday due to pharmacy being out. Pt states she woke up this morning in sweats. She reports symptoms of rhinorrhea, congestion, decreased appetite and cough, worse at night. She is on O2 at home and has been using her inhalers. She denies chest tightness, CP, palpitation, wheezing  Patient Active Problem List   Diagnosis Date Noted  . Abdominal pain   . Hypoxia 04/08/2015  . Opioid use with withdrawal ([place])   . Protein-calorie malnutrition ([place])   . Episode of recurrent major depressive disorder ([place])   . Secondary hyperparathyroidism ([place]) 01/10/2015  . Vitamin D deficiency 01/10/2015  . 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 Anemia, iron deficiency 11/03/2014  . Chronic pain syndrome 10/24/2014  . Osteoporosis 10/05/2014  . Vertebral compression fracture ([place]) 10/05/2014  . Coronary artery disease due to calcified coronary lesion 10/05/2014  . Weight loss   . Tobacco abuse   . History of subtotal gastrectomy with Roux-en-Y recontruction   . Orthostatic hypotension 08/11/2014  . Severe protein-calorie malnutrition ([place]) 12/30/2013  . Constipation, chronic 07/10/2013  . Chronic abdominal pain   . COPD (chronic obstructive pulmonary  disease) ([place]) 11/07/2010  . Major depressive disorder, recurrent episode, moderate ([place]) 06/06/2010  . 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 Anxiety state 01/07/2007  . BARRETT'S ESOPHAGUS, HX OF 07/03/2005   Past Medical History  Diagnosis Date  . Peptic ulcer disease     dr Oletta Lamas  . Acute duodenal ulcer with hemorrhage and perforation, with obstruction ([place])   . Barrett's esophagus   . Menopause   . GERD (gastroesophageal reflux disease)   . Asthma   . Chronic abdominal pain     narcotic dependence, dr gyarteng-dak at [facility]  . Narcotic abuse   . Anxiety     sees [name] NP at Dr. Radonna Ricker office  . Fx two ribs-open 08-24-11    left 4th and 5th  . Fx of fibula 07-28-11    left fibula, 2 places   . COPD (chronic obstructive pulmonary disease) ([place])   . Allergy   . Anemia   . History of blood transfusion     "related to OR; maybe when I perforated that ulcer"  . Lap Nissen + truncal vagotomy July 2008 05/13/2013  . LEG EDEMA 01/19/2008    Qualifier: Diagnosis of  By: [name] MD, [name]   . ACUTE DUODEN ULCER W/HEMORR PERF&OBSTRUCTION 06/26/2004    Qualifier: Diagnosis of  By: [name] MD, [name]   . FEVER, RECURRENT 08/11/2009    Qualifier: Diagnosis of  By: [name] MD, [name] MENOPAUSE 01/07/2007    Qualifier: Diagnosis of  BySherlynn Stalls, CMA, [place]    . Gastroparesis 06/03/2007    Qualifier: Diagnosis of  By: [name] MD, [name]   . GASTRIC OUTLET OBSTRUCTION 08/28/2007    In July 2008 she underwent laparoscopic enterolysis, Nissen fundoplication over a [id number] bougie, single pledgeted suture colosure of the hiatus and a 3 suture wrap.  Lap truncal vagotomy and a loop gastrojejunostomy was performed.  This was revised in August of 2009 to a roux en Y gastrojejujostomy.     . 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 Depression   . History of hiatal hernia   . Complication of anesthesia     pt states had too much xanax on board - agitated   .  Anginal pain ([place])     "many many years ago"  . Exertional shortness of breath   . Pneumonia, organism unspecified 11/07/2010    Assoc with R Parapneumonic effusion 09/2010    - Tapped 10/05/10    - CxR resolved 11/07/2010    . Headache     "monthly" (09/06/2014)  . Arthritis     "hips" (09/06/2014)  . Myocardial infarction ([place])   . S/P jejunostomy Feb 2016 07/26/2014   Past Surgical History  Procedure Laterality Date  . Esophagogastroduodenoscopy  12-29-09    dr qadeer at [facility], several gastric ulcers  . Laparoscopic nissen fundoplication    . Gastrojejunostomy  02-20-08    Roux en Y gastrojejunsotomy w 1 foot Roux limb  . Repair of perforated ulcer    . Biopsy thyroid  08-13-11    benign nodule, per Dr. Melida Quitter   . Esophagogastroduodenoscopy N/A 09/25/2012    Procedure: ESOPHAGOGASTRODUODENOSCOPY (EGD);  Surgeon: Beryle Beams, MD;  Location: [facility];  Service: Endoscopy;  Laterality: N/A;  . Fracture surgery    . Tonsillectomy    . Cataract extraction w/ intraocular lens  implant, bilateral Bilateral   . Laparoscopic partial gastrectomy N/A 06/29/2013    Procedure: Gastrectomy and GJ Tube placement;  Surgeon: Pedro Earls, MD;  Location: [facility];  Service: General;  Laterality: N/A;  . Colonoscopy    . Gastrostomy N/A 07/26/2014    Procedure: LAPRASCOPIC ASSISTED OPEN PLACEMENT OF JEJUNOSTOMY TUBE;  Surgeon: Pedro Earls, MD;  Location: [facility];  Service: General;  Laterality: N/A;  . Orif hip fracture Bilateral     "pins in both of them"  . Dilation and curettage of uterus    . Hernia repair      "hiatal"  . Joint replacement     Allergies  Allergen Reactions  . Lithium Nausea And Vomiting  . Celebrex [Celecoxib] Other (See Comments)    History of bleeding ulcers  . Morphine Itching  . Quetiapine Other (See Comments)     tardive dyskinesia  . Varenicline Tartrate Other (See Comments)    hallucinations   Prior to Admission medications   Medication Sig Start  Date End Date Taking? Authorizing Provider  albuterol (PROVENTIL HFA;VENTOLIN HFA) 108 (90 Base) MCG/ACT inhaler Inhale 2 puffs into the lungs every 4 (four) hours as needed (cough, shortness of breath or wheezing.). 08/01/15  Yes Shawnee Knapp, MD  ALPRAZolam [name]) 1 MG tablet Take 1 tablet (1 mg total) by mouth at bedtime as needed for anxiety. This is a 1 month supply 11/13/15  Yes Shawnee Knapp, MD  benzonatate (TESSALON) 200 MG capsule Take 1 capsule (200 mg total) by mouth 3 (three) times daily as needed for cough. 10/28/15  Yes Shawnee Knapp, MD  budesonide-formoterol [facility]) 160-4.5 MCG/ACT inhaler Inhale 2 puffs into the lungs 2 (two) times daily. 10/28/15  Yes Shawnee Knapp, MD  DULoxetine (CYMBALTA) 60 MG capsule Take 1 capsule (60 mg total) by mouth daily. 10/28/15  Yes Shawnee Knapp, MD  feeding supplement (BOOST / RESOURCE BREEZE) LIQD Take 1 Container by mouth 3 (three) times daily between meals. 11/13/15  Yes Shawnee Knapp, MD  fluticasone (FLONASE) 50 MCG/ACT nasal spray Place 2 sprays into both nostrils daily. 10/20/15  Yes Posey Boyer, MD  guaiFENesin-codeine 100-10 MG/5ML syrup Take 10 mLs by mouth every 4 (four) hours as needed for cough. 11/13/15  Yes Shawnee Knapp, MD  ipratropium (ATROVENT) 0.03 % nasal spray Place 2 sprays into both nostrils 2 (two) times daily. 10/20/15  Yes Posey Boyer, MD  ipratropium-albuterol (DUONEB) 0.5-2.5 (3) MG/3ML SOLN Take 3 mLs by nebulization 4 (four) times daily -  before meals and at bedtime. 06/03/15  Yes Robyn Haber, MD  omeprazole (PRILOSEC) 40 MG capsule TAKE ONE CAPSULE BY MOUTH TWICE DAILY 10/10/15  Yes Shawnee Knapp, MD  ondansetron (ZOFRAN ODT) 4 MG disintegrating tablet Take 1-2 tablets (4-8 mg total) by mouth every 8 (eight) hours as needed for nausea or vomiting. 10/02/15  Yes Shawnee Knapp, MD  UNABLE TO FIND Place 1 application into the nose once. Med Name: best Fit evaluation for oxygen, possibly simply go mini 08/18/15  Yes Mancel Bale, PA-C  UNABLE TO  FIND Med Name: Best fit evaluation for oxygen - possible simply go mini 08/18/15  Yes Mancel Bale, PA-C  Aspirin-Acetaminophen-Caffeine (GOODY HEADACHE PO) Take 1 packet by mouth 4 (four) times daily as needed (pain). Reported on 12/08/2015    Historical Provider, MD  Calcium Citrate-Vitamin D (CALCIUM CITRATE CHEWY BITE) 500-500 MG-UNIT CHEW Chew 1 tablet by mouth 2 (two) times daily. Patient not taking: Reported on 12/08/2015 04/17/15   Shawnee Knapp, MD  HYDROcodone-acetaminophen [facility]) 10-325 MG tablet Take 1-2 tablets by mouth every 6 (six) hours as needed for moderate pain. This is a 1 week supply. Ok to fill Saturday 12/16/15 Patient not taking: Reported on 12/08/2015 12/07/15   Robyn Haber, MD  levofloxacin (LEVAQUIN) 500 MG tablet Take 1 tablet (500 mg total) by mouth daily. 12/07/15   Robyn Haber, MD  OLANZapine (ZYPREXA) 10 MG tablet Take 1 tablet (10 mg total) by mouth at bedtime. Patient not taking: Reported on 12/08/2015 10/28/15   Shawnee Knapp, MD    Review of Systems  Constitutional: Positive for diaphoresis and appetite change. Negative for fever and chills.  HENT: Positive for congestion and rhinorrhea.   Respiratory: Positive for cough. Negative for chest tightness and shortness of breath.   Cardiovascular: Negative for chest pain and palpitations.    Objective:   Physical Exam  Constitutional: She is oriented to person, place, and time. She appears well-developed and well-nourished. No distress.  HENT:  Head: Normocephalic and atraumatic.  Mouth/Throat: Uvula is midline and oropharynx is clear and moist.  Nares with erythema Right TM with cerumen  Eyes: Conjunctivae and EOM are normal.  Neck: Neck supple.  Cardiovascular: Normal rate.   Pulmonary/Chest: Effort normal.  Good air movement.  Musculoskeletal: Normal range of motion.  Neurological: She is alert and oriented to person, place, and time.  Skin: Skin is warm and dry.  Psychiatric: She has a normal mood and  affect. Her behavior is normal.  Nursing note and  vitals reviewed.  Filed Vitals:   12/08/15 0840  BP: 110/82  Pulse: 72  Temp: 98.9 F (37.2 C)  TempSrc: Oral  Resp: 16  Height: 5' 3.5" (1.613 m)  Weight: 98 lb 6.4 oz (44.634 kg)  SpO2: 96%    Assessment & Plan:   1. Acute URI   Pt was seen by Dr. Joseph Art yesterday and prescribed levaquin and prednisone after she was given a dose of Rocephin IM x1 yesterday and declines repeat due to pain at injection site.  She reports that the pharmacy did not have any medication for her but [place] called [facility] who confirmed they have both the levaquin and prednisone ready for pt. She will start these today. Gave pt her rx for her chronic narcotics for the week which she can fill tomorrow. There is a rx on file in the front office for 6/24 hydrocodone refill as well.   Meds ordered this encounter  Medications  . guaiFENesin-codeine 100-10 MG/5ML syrup    Sig: Take 10 mLs by mouth every 4 (four) hours as needed for cough.    Dispense:  500 mL    Refill:  0   I personally performed the services described in this documentation, which was scribed in my presence. The recorded information has been reviewed and considered, and addended by me as needed.   [name], M.D.  [facility] [address] [place], [place] [address] [phone] phone [phone] fax  12/08/2015 9:53 AM

## 2015-12-11 ENCOUNTER — Ambulatory Visit (INDEPENDENT_AMBULATORY_CARE_PROVIDER_SITE_OTHER): Payer: Medicare Other | Admitting: Family Medicine

## 2015-12-11 VITALS — BP 118/70 | HR 76 | Temp 99.1°F | Resp 18 | Ht 63.5 in | Wt 98.8 lb

## 2015-12-11 DIAGNOSIS — Z903 Acquired absence of stomach [part of]: Secondary | ICD-10-CM | POA: Diagnosis not present

## 2015-12-11 DIAGNOSIS — F4323 Adjustment disorder with mixed anxiety and depressed mood: Secondary | ICD-10-CM

## 2015-12-11 DIAGNOSIS — S41151A Open bite of right upper arm, initial encounter: Secondary | ICD-10-CM | POA: Diagnosis not present

## 2015-12-11 DIAGNOSIS — G894 Chronic pain syndrome: Secondary | ICD-10-CM

## 2015-12-11 DIAGNOSIS — W540XXA Bitten by dog, initial encounter: Secondary | ICD-10-CM | POA: Diagnosis not present

## 2015-12-11 DIAGNOSIS — Z23 Encounter for immunization: Secondary | ICD-10-CM | POA: Diagnosis not present

## 2015-12-11 MED ORDER — MUPIROCIN 2 % EX OINT: 1.0000 "application " | TOPICAL_OINTMENT | Freq: Three times a day (TID) | CUTANEOUS | Status: DC

## 2015-12-11 MED ORDER — ALPRAZOLAM 1 MG PO TABS: 1.0000 mg | ORAL_TABLET | Freq: Every evening | ORAL | Status: DC | PRN

## 2015-12-11 MED ORDER — ONDANSETRON 4 MG PO TBDP: 4.0000 mg | ORAL_TABLET | Freq: Three times a day (TID) | ORAL | Status: DC | PRN

## 2015-12-11 MED ORDER — CLINDAMYCIN HCL 300 MG PO CAPS: 300.0000 mg | ORAL_CAPSULE | Freq: Four times a day (QID) | ORAL | Status: DC

## 2015-12-11 NOTE — Progress Notes (Signed)
By signing my name below, I, [name], attest that this documentation has been prepared under the direction and in the presence of [name], MD.  Electronically Signed: [name], Medical Scribe. 12/11/2015. 10:31 AM.  Subjective:    Patient ID: [name], female    DOB: January 29, 1951, 65 y.o.   MRN: [id number]  HPI Chief Complaint  Patient presents with  . Animal Bite  . Medication Problem    GO OVER MEDS    HPI Comments: [name] is a 65 y.o. female who presents to the [facility] complaining of dog bite on her right lower arm onset yesterday. Pt was bit by her dog and another friends dog was play fighting and when she tried to seperate them, a tooth snagged her skin off. Pt states  Pt put a medicated band-aid on her arm over night and the wound has improved in appearance.  Pt is on the Levaquin and Prednisone. Pt would ike to get nausea medicine. Pt states she always has something wrong with her stomach, and [name]'s stomach.  Pt states her chest is clear. Pt states she can't stop smoking cigarettes.  Patient Active Problem List   Diagnosis Date Noted  . Abdominal pain   . Hypoxia 04/08/2015  . Opioid use with withdrawal ([place])   . Protein-calorie malnutrition ([place])   . Episode of recurrent major depressive disorder ([place])   . Secondary hyperparathyroidism ([place]) 01/10/2015  . Vitamin D deficiency 01/10/2015  . 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 Anemia, iron deficiency 11/03/2014  . Chronic pain syndrome 10/24/2014  . Osteoporosis 10/05/2014  . Vertebral compression fracture ([place]) 10/05/2014  . Coronary artery disease due to calcified coronary lesion 10/05/2014  . Weight loss   . Tobacco abuse   . History of subtotal gastrectomy with Roux-en-Y recontruction   . Orthostatic hypotension 08/11/2014  . Severe protein-calorie malnutrition ([place]) 12/30/2013  . Constipation, chronic 07/10/2013  . Chronic abdominal pain   . COPD  (chronic obstructive pulmonary disease) ([place]) 11/07/2010  . Major depressive disorder, recurrent episode, moderate ([place]) 06/06/2010  . 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 Anxiety state 01/07/2007  . BARRETT'S ESOPHAGUS, HX OF 07/03/2005   Past Medical History  Diagnosis Date  . Peptic ulcer disease     dr Oletta Lamas  . Acute duodenal ulcer with hemorrhage and perforation, with obstruction ([place])   . Barrett's esophagus   . Menopause   . GERD (gastroesophageal reflux disease)   . Asthma   . Chronic abdominal pain     narcotic dependence, dr gyarteng-dak at [facility]  . Narcotic abuse   . Anxiety     sees [name] NP at Dr. Radonna Ricker office  . Fx two ribs-open 08-24-11    left 4th and 5th  . Fx of fibula 07-28-11    left fibula, 2 places   . COPD (chronic obstructive pulmonary disease) ([place])   . Allergy   . Anemia   . History of blood transfusion     "related to OR; maybe when I perforated that ulcer"  . Lap Nissen + truncal vagotomy July 2008 05/13/2013  . LEG EDEMA 01/19/2008    Qualifier: Diagnosis of  By: [name] MD, [name]   . ACUTE DUODEN ULCER W/HEMORR PERF&OBSTRUCTION 06/26/2004    Qualifier: Diagnosis of  By: [name] MD, [name]   . FEVER, RECURRENT 08/11/2009    Qualifier: Diagnosis of  By: [name] MD, [name] MENOPAUSE  01/07/2007    Qualifier: Diagnosis of  By: [name], CMA, [place]    . Gastroparesis 06/03/2007    Qualifier: Diagnosis of  By: [name] MD, [name]   . GASTRIC OUTLET OBSTRUCTION 08/28/2007    In July 2008 she underwent laparoscopic enterolysis, Nissen fundoplication over a [id number] bougie, single pledgeted suture colosure of the hiatus and a 3 suture wrap.  Lap truncal vagotomy and a loop gastrojejunostomy was performed.  This was revised in August of 2009 to a roux en Y gastrojejujostomy.     . 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 Depression   . History of hiatal hernia   . Complication of anesthesia     pt states had too much  xanax on board - agitated   . Anginal pain ([place])     "many many years ago"  . Exertional shortness of breath   . Pneumonia, organism unspecified 11/07/2010    Assoc with R Parapneumonic effusion 09/2010    - Tapped 10/05/10    - CxR resolved 11/07/2010    . Headache     "monthly" (09/06/2014)  . Arthritis     "hips" (09/06/2014)  . Myocardial infarction ([place])   . S/P jejunostomy Feb 2016 07/26/2014   Past Surgical History  Procedure Laterality Date  . Esophagogastroduodenoscopy  12-29-09    dr qadeer at [facility], several gastric ulcers  . Laparoscopic nissen fundoplication    . Gastrojejunostomy  02-20-08    Roux en Y gastrojejunsotomy w 1 foot Roux limb  . Repair of perforated ulcer    . Biopsy thyroid  08-13-11    benign nodule, per Dr. Melida Quitter   . Esophagogastroduodenoscopy N/A 09/25/2012    Procedure: ESOPHAGOGASTRODUODENOSCOPY (EGD);  Surgeon: Beryle Beams, MD;  Location: [facility];  Service: Endoscopy;  Laterality: N/A;  . Fracture surgery    . Tonsillectomy    . Cataract extraction w/ intraocular lens  implant, bilateral Bilateral   . Laparoscopic partial gastrectomy N/A 06/29/2013    Procedure: Gastrectomy and GJ Tube placement;  Surgeon: Pedro Earls, MD;  Location: [facility];  Service: General;  Laterality: N/A;  . Colonoscopy    . Gastrostomy N/A 07/26/2014    Procedure: LAPRASCOPIC ASSISTED OPEN PLACEMENT OF JEJUNOSTOMY TUBE;  Surgeon: Pedro Earls, MD;  Location: [facility];  Service: General;  Laterality: N/A;  . Orif hip fracture Bilateral     "pins in both of them"  . Dilation and curettage of uterus    . Hernia repair      "hiatal"  . Joint replacement     Allergies  Allergen Reactions  . Lithium Nausea And Vomiting  . Celebrex [Celecoxib] Other (See Comments)    History of bleeding ulcers  . Morphine Itching  . Quetiapine Other (See Comments)     tardive dyskinesia  . Varenicline Tartrate Other (See Comments)    hallucinations   Prior to Admission  medications   Medication Sig Start Date End Date Taking? Authorizing Provider  albuterol (PROVENTIL HFA;VENTOLIN HFA) 108 (90 Base) MCG/ACT inhaler Inhale 2 puffs into the lungs every 4 (four) hours as needed (cough, shortness of breath or wheezing.). 08/01/15   Shawnee Knapp, MD  ALPRAZolam [name]) 1 MG tablet Take 1 tablet (1 mg total) by mouth at bedtime as needed for anxiety. This is a 1 month supply 11/13/15   Shawnee Knapp, MD  Aspirin-Acetaminophen-Caffeine (GOODY HEADACHE PO) Take 1 packet by mouth 4 (four) times daily as  needed (pain). Reported on 12/08/2015    Historical Provider, MD  benzonatate (TESSALON) 200 MG capsule Take 1 capsule (200 mg total) by mouth 3 (three) times daily as needed for cough. 10/28/15   Shawnee Knapp, MD  budesonide-formoterol [facility]) 160-4.5 MCG/ACT inhaler Inhale 2 puffs into the lungs 2 (two) times daily. 10/28/15   Shawnee Knapp, MD  Calcium Citrate-Vitamin D (CALCIUM CITRATE CHEWY BITE) 500-500 MG-UNIT CHEW Chew 1 tablet by mouth 2 (two) times daily. Patient not taking: Reported on 12/08/2015 04/17/15   Shawnee Knapp, MD  DULoxetine (CYMBALTA) 60 MG capsule Take 1 capsule (60 mg total) by mouth daily. 10/28/15   Shawnee Knapp, MD  feeding supplement (BOOST / RESOURCE BREEZE) LIQD Take 1 Container by mouth 3 (three) times daily between meals. 11/13/15   Shawnee Knapp, MD  fluticasone (FLONASE) 50 MCG/ACT nasal spray Place 2 sprays into both nostrils daily. 10/20/15   Posey Boyer, MD  guaiFENesin-codeine 100-10 MG/5ML syrup Take 10 mLs by mouth every 4 (four) hours as needed for cough. 12/08/15   Shawnee Knapp, MD  HYDROcodone-acetaminophen (NORCO) 10-325 MG tablet Take 1-2 tablets by mouth every 6 (six) hours as needed for moderate pain. This is a 1 week supply. Ok to fill Saturday 12/16/15 Patient not taking: Reported on 12/08/2015 12/07/15   Robyn Haber, MD  ipratropium (ATROVENT) 0.03 % nasal spray Place 2 sprays into both nostrils 2 (two) times daily. 10/20/15   Posey Boyer, MD    ipratropium-albuterol (DUONEB) 0.5-2.5 (3) MG/3ML SOLN Take 3 mLs by nebulization 4 (four) times daily -  before meals and at bedtime. 06/03/15   Robyn Haber, MD  levofloxacin (LEVAQUIN) 500 MG tablet Take 1 tablet (500 mg total) by mouth daily. 12/07/15   Robyn Haber, MD  OLANZapine (ZYPREXA) 10 MG tablet Take 1 tablet (10 mg total) by mouth at bedtime. Patient not taking: Reported on 12/08/2015 10/28/15   Shawnee Knapp, MD  omeprazole (PRILOSEC) 40 MG capsule TAKE ONE CAPSULE BY MOUTH TWICE DAILY 10/10/15   Shawnee Knapp, MD  ondansetron (ZOFRAN ODT) 4 MG disintegrating tablet Take 1-2 tablets (4-8 mg total) by mouth every 8 (eight) hours as needed for nausea or vomiting. 10/02/15   Shawnee Knapp, MD  UNABLE TO FIND Place 1 application into the nose once. Med Name: best Fit evaluation for oxygen, possibly simply go mini 08/18/15   Mancel Bale, PA-C  UNABLE TO FIND Med Name: Best fit evaluation for oxygen - possible simply go mini 08/18/15   Mancel Bale, PA-C   Social History   Social History  . Marital Status: Divorced    Spouse Name: N/A  . Number of Children: N/A  . Years of Education: N/A   Occupational History  . Not on file.   Social History Main Topics  . Smoking status: Current Every Day Smoker -- 2.00 packs/day for 50 years    Types: Cigarettes  . Smokeless tobacco: Never Used  . Alcohol Use: No  . Drug Use: No  . Sexual Activity: No   Other Topics Concern  . Not on file   Social History Narrative   Depression screen [facility] 2/9 12/11/2015 12/08/2015 12/07/2015 11/25/2015 11/13/2015  Decreased Interest 0 0 0 0 0  Down, Depressed, Hopeless 0 0 0 3 0  PHQ - 2 Score 0 0 0 3 0  Altered sleeping - 0 - 3 -  Tired, decreased energy - 0 - 3 -  Change in appetite - 0 - 0 -  Feeling bad or failure about yourself  - 0 - 3 -  Trouble concentrating - 0 - 3 -  Moving slowly or fidgety/restless - 0 - 0 -  Suicidal thoughts - 0 - 0 -  PHQ-9 Score - 0 - 15 -  Difficult doing work/chores - -  - - -   Review of Systems  Constitutional: Positive for fatigue. Negative for fever, activity change, appetite change and unexpected weight change.  HENT: Negative for congestion, ear pain, rhinorrhea and sinus pressure.   Respiratory: Positive for cough. Negative for chest tightness, shortness of breath and wheezing.   Gastrointestinal: Positive for nausea. Negative for vomiting, abdominal pain, diarrhea and constipation.  Musculoskeletal: Positive for myalgias, back pain, arthralgias and gait problem. Negative for joint swelling.  Skin: Positive for wound. Negative for color change and rash.  Allergic/Immunologic: Positive for immunocompromised state.  Hematological: Negative for adenopathy. Bruises/bleeds easily.  Psychiatric/Behavioral: Negative for sleep disturbance.   Objective:  BP 118/70 mmHg  Pulse 76  Temp(Src) 99.1 F (37.3 C) (Oral)  Resp 18  Ht 5' 3.5" (1.613 m)  Wt 98 lb 12.8 oz (44.815 kg)  BMI 17.22 kg/m2  SpO2 97%  Physical Exam  Constitutional: She appears well-developed and well-nourished. No distress.  HENT:  Head: Normocephalic and atraumatic.  Left Ear: Tympanic membrane normal.  Mouth/Throat: Uvula is midline. Mucous membranes are dry. Posterior oropharyngeal erythema present. No oropharyngeal exudate or posterior oropharyngeal edema.  Nares edema and purulent erythema Right canal occluded with cerumen.  Eyes: Conjunctivae are normal.  Neck: Neck supple.  Cardiovascular: Normal rate.   Pulmonary/Chest: Effort normal.  Neurological: She is alert.  Skin: Skin is warm and dry. Laceration noted.     2 cm triangular skin tear on the right forearm, hemostatic, wound edges healthy pink, good vascular supply on wound bed, no drainage.  Psychiatric: She has a normal mood and affect. Her behavior is normal.  Nursing note and vitals reviewed.  Assessment & Plan:  Right TM needs lavage next appt 1. Adjustment disorder with mixed anxiety and depressed mood - on  alprazolam 1mg  qhs -have decreased to this dose since recent ER visit after unintentional OD.  2. Dog bite of arm, right, initial encounter - already on levaquin so will add clindamycin to complete coverage.  Pt has a large skin tear but the wound actually looks great. Cont to apply mupirocin qd and wash with soap and water.   3. Chronic pain syndrome - will refill hydrocodone 1 wk at a time as pt has had so much difficulty complying with the instructions, frequently loosing medications, thought it was being stolen.  She has a rx waiting for her to pick up on 6/24 Sat.  I will need to provide additional refills after that.  4. History of subtotal gastrectomy with Roux-en-Y recontruction - on chronic zofran, refilled.     Orders Placed This Encounter  Procedures  . Tdap vaccine greater than or equal to 7yo IM    Meds ordered this encounter  Medications  . clindamycin (CLEOCIN) 300 MG capsule    Sig: Take 1 capsule (300 mg total) by mouth 4 (four) times daily.    Dispense:  28 capsule    Refill:  0  . mupirocin ointment (BACTROBAN) 2 %    Sig: Apply 1 application topically 3 (three) times daily.    Dispense:  22 g    Refill:  1  . ondansetron (  ZOFRAN ODT) 4 MG disintegrating tablet    Sig: Take 1-2 tablets (4-8 mg total) by mouth every 8 (eight) hours as needed for nausea or vomiting.    Dispense:  180 tablet    Refill:  0  . ALPRAZolam (XANAX) 1 MG tablet    Sig: Take 1 tablet (1 mg total) by mouth at bedtime as needed for anxiety. This is a 1 month supply    Dispense:  30 tablet    Refill:  0    I personally performed the services described in this documentation, which was scribed in my presence. The recorded information has been reviewed and considered, and addended by me as needed.   [name], M.D.  [facility] [address] [place], [place] [address] [phone] phone [phone] fax  12/11/2015 8:00 PM

## 2015-12-11 NOTE — Patient Instructions (Addendum)
     IF you received an x-ray today, you will receive an invoice from [facility]. Please contact [facility] at [phone] with questions or concerns regarding your invoice.   IF you received labwork today, you will receive an invoice from [facility]. Please contact [facility] at [phone] with questions or concerns regarding your invoice.   Our billing staff will not be able to assist you with questions regarding bills from these companies.  You will be contacted with the lab results as soon as they are available. The fastest way to get your results is to activate your My Chart account. Instructions are located on the last page of this paperwork. If you have not heard from us regarding the results in 2 weeks, please contact this office.     We recommend that you schedule a mammogram for breast cancer screening. Typically, you do not need a referral to do this. Please contact a local imaging center to schedule your mammogram.  [facility] - [phone]  *ask for the Radiology Department [facility]) - [phone] or [phone]  [facility] - [phone] [facility] - [phone] [facility]  - [phone]  *ask for the Radiology Department [facility] - [phone]  *ask for the Radiology Department [facility] - [phone]  *ask for the Mammography Department [facility] - [phone]  

## 2015-12-16 ENCOUNTER — Ambulatory Visit (INDEPENDENT_AMBULATORY_CARE_PROVIDER_SITE_OTHER): Payer: Medicare Other | Admitting: Family Medicine

## 2015-12-16 VITALS — BP 116/68 | HR 109 | Temp 99.0°F | Resp 17 | Ht 63.0 in | Wt 104.0 lb

## 2015-12-16 DIAGNOSIS — G894 Chronic pain syndrome: Secondary | ICD-10-CM

## 2015-12-16 DIAGNOSIS — H6121 Impacted cerumen, right ear: Secondary | ICD-10-CM

## 2015-12-16 DIAGNOSIS — W540XXD Bitten by dog, subsequent encounter: Secondary | ICD-10-CM | POA: Diagnosis not present

## 2015-12-16 DIAGNOSIS — R636 Underweight: Secondary | ICD-10-CM | POA: Diagnosis not present

## 2015-12-16 DIAGNOSIS — R5383 Other fatigue: Secondary | ICD-10-CM

## 2015-12-16 DIAGNOSIS — S51851D Open bite of right forearm, subsequent encounter: Secondary | ICD-10-CM | POA: Diagnosis not present

## 2015-12-16 MED ORDER — HYDROCODONE-ACETAMINOPHEN 10-325 MG PO TABS: 1.0000 | ORAL_TABLET | Freq: Four times a day (QID) | ORAL | Status: DC | PRN

## 2015-12-16 MED ORDER — BOOST / RESOURCE BREEZE PO LIQD: 1.0000 | Freq: Three times a day (TID) | ORAL | Status: DC

## 2015-12-16 NOTE — Progress Notes (Addendum)
Subjective:    Patient ID: [name], female    DOB: 1950/11/26, 65 y.o.   MRN: [id number] By signing my name below, I, [name], attest that this documentation has been prepared under the direction and in the presence of [name], MD. Electronically Signed: [name], ER Scribe. 12/16/2015. 8:27 AM.  Chief Complaint  Patient presents with  . Ear Fullness    HPI HPI Comments: [name] is a 65 y.o. female who presents to [facility] reporting for a follow up appointment. She is wondering if she can up her antidepressant medication. She is not sure how much she is taking or how she remembers - just keeps a bag of medication.  Past Medical History  Diagnosis Date  . Peptic ulcer disease     dr Oletta Lamas  . Acute duodenal ulcer with hemorrhage and perforation, with obstruction ([place])   . Barrett's esophagus   . Menopause   . GERD (gastroesophageal reflux disease)   . Asthma   . Chronic abdominal pain     narcotic dependence, dr gyarteng-dak [facility]  . Narcotic abuse   . Anxiety     sees [name] NP at Dr. Radonna Ricker office  . Fx two ribs-open 08-24-11    left 4th and 5th  . Fx of fibula 07-28-11    left fibula, 2 places   . COPD (chronic obstructive pulmonary disease) ([place])   . Allergy   . Anemia   . History of blood transfusion     "related to OR; maybe when I perforated that ulcer"  . Lap Nissen + truncal vagotomy July 2008 05/13/2013  . LEG EDEMA 01/19/2008    Qualifier: Diagnosis of  By: [name] MD, [name]   . ACUTE DUODEN ULCER W/HEMORR PERF&OBSTRUCTION 06/26/2004    Qualifier: Diagnosis of  By: [name] MD, [name]   . FEVER, RECURRENT 08/11/2009    Qualifier: Diagnosis of  By: [name] MD, [name] MENOPAUSE 01/07/2007    Qualifier: Diagnosis of  By: [name], CMA, [place]    . Gastroparesis 06/03/2007    Qualifier: Diagnosis of  By: [name] MD, [name]   . GASTRIC OUTLET OBSTRUCTION 08/28/2007    In July 2008 she underwent laparoscopic enterolysis,  Nissen fundoplication over a [id number] bougie, single pledgeted suture colosure of the hiatus and a 3 suture wrap.  Lap truncal vagotomy and a loop gastrojejunostomy was performed.  This was revised in August of 2009 to a roux en Y gastrojejujostomy.     . 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 Depression   . History of hiatal hernia   . Complication of anesthesia     pt states had too much xanax on board - agitated   . Anginal pain ([place])     "many many years ago"  . Exertional shortness of breath   . Pneumonia, organism unspecified 11/07/2010    Assoc with R Parapneumonic effusion 09/2010    - Tapped 10/05/10    - CxR resolved 11/07/2010    . Headache     "monthly" (09/06/2014)  . Arthritis     "hips" (09/06/2014)  . Myocardial infarction ([place])   . S/P jejunostomy Feb 2016 07/26/2014   Allergies  Allergen Reactions  . Lithium Nausea And Vomiting  . Celebrex [Celecoxib] Other (See Comments)    History of bleeding ulcers  . Morphine Itching  . Quetiapine Other (See Comments)  tardive dyskinesia  . Varenicline Tartrate Other (See Comments)    hallucinations   Current Outpatient Prescriptions on File Prior to Visit  Medication Sig Dispense Refill  . albuterol (PROVENTIL HFA;VENTOLIN HFA) 108 (90 Base) MCG/ACT inhaler Inhale 2 puffs into the lungs every 4 (four) hours as needed (cough, shortness of breath or wheezing.). 1 Inhaler 3  . ALPRAZolam (XANAX) 1 MG tablet Take 1 tablet (1 mg total) by mouth at bedtime as needed for anxiety. This is a 1 month supply 30 tablet 0  . Aspirin-Acetaminophen-Caffeine (GOODY HEADACHE PO) Take 1 packet by mouth 4 (four) times daily as needed (pain). Reported on 12/08/2015    . benzonatate (TESSALON) 200 MG capsule Take 1 capsule (200 mg total) by mouth 3 (three) times daily as needed for cough. 60 capsule 0  . budesonide-formoterol (SYMBICORT) 160-4.5 MCG/ACT inhaler Inhale 2 puffs into the lungs 2 (two) times daily. 1 Inhaler 3  .  Calcium Citrate-Vitamin D (CALCIUM CITRATE CHEWY BITE) 500-500 MG-UNIT CHEW Chew 1 tablet by mouth 2 (two) times daily. 180 tablet 3  . clindamycin (CLEOCIN) 300 MG capsule Take 1 capsule (300 mg total) by mouth 4 (four) times daily. 28 capsule 0  . DULoxetine (CYMBALTA) 60 MG capsule Take 1 capsule (60 mg total) by mouth daily. 30 capsule 1  . feeding supplement (BOOST / RESOURCE BREEZE) LIQD Take 1 Container by mouth 3 (three) times daily between meals. 90 Container 11  . fluticasone (FLONASE) 50 MCG/ACT nasal spray Place 2 sprays into both nostrils daily. 16 g 6  . guaiFENesin-codeine 100-10 MG/5ML syrup Take 10 mLs by mouth every 4 (four) hours as needed for cough. 500 mL 0  . HYDROcodone-acetaminophen (NORCO) 10-325 MG tablet Take 1-2 tablets by mouth every 6 (six) hours as needed for moderate pain. This is a 1 week supply. Ok to fill Saturday 12/16/15 20 tablet 0  . ipratropium (ATROVENT) 0.03 % nasal spray Place 2 sprays into both nostrils 2 (two) times daily. 30 mL 0  . ipratropium-albuterol (DUONEB) 0.5-2.5 (3) MG/3ML SOLN Take 3 mLs by nebulization 4 (four) times daily -  before meals and at bedtime. 360 mL 3  . levofloxacin (LEVAQUIN) 500 MG tablet Take 1 tablet (500 mg total) by mouth daily. 7 tablet 0  . mupirocin ointment (BACTROBAN) 2 % Apply 1 application topically 3 (three) times daily. 22 g 1  . OLANZapine (ZYPREXA) 10 MG tablet Take 1 tablet (10 mg total) by mouth at bedtime. 30 tablet 5  . omeprazole (PRILOSEC) 40 MG capsule TAKE ONE CAPSULE BY MOUTH TWICE DAILY 180 capsule 3  . ondansetron (ZOFRAN ODT) 4 MG disintegrating tablet Take 1-2 tablets (4-8 mg total) by mouth every 8 (eight) hours as needed for nausea or vomiting. 180 tablet 0  . UNABLE TO FIND Place 1 application into the nose once. Med Name: best Fit evaluation for oxygen, possibly simply go mini 1 Device 0  . UNABLE TO FIND Med Name: Best fit evaluation for oxygen - possible simply go mini 1 Device 0   No current  facility-administered medications on file prior to visit.    Review of Systems  Constitutional: Positive for activity change. Negative for fever, chills and appetite change.  Cardiovascular: Negative for leg swelling.  Musculoskeletal: Positive for back pain and arthralgias. Negative for joint swelling and gait problem.  Skin: Positive for wound. Negative for rash.  Psychiatric/Behavioral: Positive for behavioral problems, confusion, sleep disturbance and dysphoric mood. Negative for agitation. The patient is nervous/anxious.  Objective:  BP 116/68 mmHg  Pulse 109  Temp(Src) 99 F (37.2 C) (Oral)  Resp 17  Ht 5\' 3"  (1.6 m)  Wt 104 lb (47.174 kg)  BMI 18.43 kg/m2  SpO2 90%  Physical Exam  Constitutional: She is oriented to person, place, and time. She appears well-developed and well-nourished. No distress.  HENT:  Head: Normocephalic and atraumatic.  Eyes: Pupils are equal, round, and reactive to light.  Neck: Neck supple.  Cardiovascular: Normal rate.   Pulmonary/Chest: Effort normal. No respiratory distress.  Musculoskeletal: Normal range of motion.  Neurological: She is alert and oriented to person, place, and time. Coordination normal.  Skin: Skin is warm and dry. She is not diaphoretic.  Psychiatric: She has a normal mood and affect. Her behavior is normal.  Nursing note and vitals reviewed.     Assessment & Plan:   1. Right ear impacted cerumen - removed by lavage  2. Dog bite of forearm without complication, right, subsequent encounter - healing VERY well - dressed with mupirocin and coban  3. Chronic pain syndrome   4. Lethargic  5. Underweight   Cont with weekly hydrocodone rxs - placed the next 3 wks in front for pt to pick up one week at a time.     Meds ordered this encounter  Medications  . DISCONTD: HYDROcodone-acetaminophen (NORCO) 10-325 MG tablet    Sig: Take 1-2 tablets by mouth every 6 (six) hours as needed for moderate pain. This is a 1 week  supply. Ok to fill Saturday 12/23/15    Dispense:  60 tablet    Refill:  0  . feeding supplement (BOOST / RESOURCE BREEZE) LIQD    Sig: Take 1 Container by mouth 3 (three) times daily between meals.    Dispense:  90 Container    Refill:  [place] to substitute a similar product if cost for pt is better and change is acceptable to pt  . DISCONTD: HYDROcodone-acetaminophen (NORCO) 10-325 MG tablet    Sig: Take 1-2 tablets by mouth every 6 (six) hours as needed for moderate pain. This is a 1 week supply. Ok to fill Saturday 12/30/15    Dispense:  60 tablet    Refill:  0  . HYDROcodone-acetaminophen (NORCO) 10-325 MG tablet    Sig: Take 1-2 tablets by mouth every 6 (six) hours as needed for moderate pain. This is a 1 week supply. Ok to fill Saturday 01/06/16    Dispense:  60 tablet    Refill:  0    I personally performed the services described in this documentation, which was scribed in my presence. The recorded information has been reviewed and considered, and addended by me as needed.   [name], M.D.  [facility] [address] [place], [place] [address] [phone] phone [phone] fax  12/16/2015 4:54 PM

## 2015-12-16 NOTE — Patient Instructions (Signed)
     IF you received an x-ray today, you will receive an invoice from [facility]. Please contact [facility] at [phone] with questions or concerns regarding your invoice.   IF you received labwork today, you will receive an invoice from [facility]. Please contact [facility] at [phone] with questions or concerns regarding your invoice.   Our billing staff will not be able to assist you with questions regarding bills from these companies.  You will be contacted with the lab results as soon as they are available. The fastest way to get your results is to activate your My Chart account. Instructions are located on the last page of this paperwork. If you have not heard from us regarding the results in 2 weeks, please contact this office.      

## 2015-12-17 DIAGNOSIS — J449 Chronic obstructive pulmonary disease, unspecified: Secondary | ICD-10-CM | POA: Diagnosis not present

## 2015-12-18 ENCOUNTER — Ambulatory Visit (INDEPENDENT_AMBULATORY_CARE_PROVIDER_SITE_OTHER): Payer: Medicare Other | Admitting: Family Medicine

## 2015-12-18 VITALS — BP 110/72 | HR 76 | Temp 98.6°F | Resp 16 | Ht 63.0 in | Wt 101.2 lb

## 2015-12-18 DIAGNOSIS — R509 Fever, unspecified: Secondary | ICD-10-CM | POA: Diagnosis not present

## 2015-12-18 LAB — POCT URINALYSIS DIP (MANUAL ENTRY)
BILIRUBIN UA: NEGATIVE
Bilirubin, UA: NEGATIVE
Blood, UA: NEGATIVE
Glucose, UA: NEGATIVE
LEUKOCYTES UA: NEGATIVE
NITRITE UA: NEGATIVE
PH UA: 5
PROTEIN UA: NEGATIVE
Spec Grav, UA: <=1.005
UROBILINOGEN UA: 0.2

## 2015-12-18 LAB — POCT CBC
GRANULOCYTE PERCENT: 54.5 % (ref 37–80)
HEMATOCRIT: 36 % — AB (ref 37.7–47.9)
Hemoglobin: 12.5 g/dL (ref 12.2–16.2)
Lymph, poc: 2.6 (ref 0.6–3.4)
MCH, POC: 31.7 pg — AB (ref 27–31.2)
MCHC: 34.8 g/dL (ref 31.8–35.4)
MCV: 91.1 fL (ref 80–97)
MID (CBC): 0.7 (ref 0–0.9)
MPV: 7.5 fL (ref 0–99.8)
POC Granulocyte: 3.9 (ref 2–6.9)
POC LYMPH %: 35.9 % (ref 10–50)
POC MID %: 9.6 %M (ref 0–12)
Platelet Count, POC: 289 10*3/uL (ref 142–424)
RBC: 3.96 M/uL — AB (ref 4.04–5.48)
RDW, POC: 16.2 %
WBC: 7.2 10*3/uL (ref 4.6–10.2)

## 2015-12-18 LAB — POCT SEDIMENTATION RATE: POCT SED RATE: 25 mm/hr — AB (ref 0–22)

## 2015-12-18 LAB — POC MICROSCOPIC URINALYSIS (UMFC): Mucus: ABSENT

## 2015-12-18 NOTE — Progress Notes (Signed)
Subjective:  By signing my name below, I, [name], attest that this documentation has been prepared under the direction and in the presence of [name], MD. Electronically Signed: [name], [place]. 12/18/2015 , 2:33 PM .  Patient was seen in Room 7 .   Patient ID: [name], female    DOB: 1951-01-03, 64 y.o.   MRN: [id number] Chief Complaint  Patient presents with  . Follow-up    dog bite, per pt had a fever this morning    HPI [name] is a 65 y.o. female who presents to [facility] for follow up.  She was seen 2 days ago, in a routine follow up visit for lavage of cerumen and follow up for dog bite wound. She initially suffered dog bite 9 days prior which was healing beautifully. When patient suffered wound, she was on already on levaquin for 3 days for COPD exacerbation and clindamycin was added to complete coverage for animal bite. She should have finished the levaquin 3 days ago and be on the last dose of clindamycin today. Unfortunately, patient is highly non compliant with her medications and so, really unable to confirm if she's taken the antibiotics or not.   Patient reports continuously using her O2 at night. She has an occasional cough when she smokes a cigarette. She was feeling feverish last night (Tmax 99). She also states feeling nauseated and fatigue. She denies wheezing, vomiting, constipation, diarrhea or urinary symptoms.   Patient also mentions removing the bandage over the dog bite last night when she took a shower. She noticed the wound has been healing fantastically.   Past Medical History  Diagnosis Date  . Peptic ulcer disease     dr Oletta Lamas  . Acute duodenal ulcer with hemorrhage and perforation, with obstruction ([place])   . Barrett's esophagus   . Menopause   . GERD (gastroesophageal reflux disease)   . Asthma   . Chronic abdominal pain     narcotic dependence, dr gyarteng-dak at [facility]  . Narcotic abuse   . Anxiety     sees  [name] NP at Dr. Radonna Ricker office  . Fx two ribs-open 08-24-11    left 4th and 5th  . Fx of fibula 07-28-11    left fibula, 2 places   . COPD (chronic obstructive pulmonary disease) ([place])   . Allergy   . Anemia   . History of blood transfusion     "related to OR; maybe when I perforated that ulcer"  . Lap Nissen + truncal vagotomy July 2008 05/13/2013  . LEG EDEMA 01/19/2008    Qualifier: Diagnosis of  By: [name] MD, [name]   . ACUTE DUODEN ULCER W/HEMORR PERF&OBSTRUCTION 06/26/2004    Qualifier: Diagnosis of  By: [name] MD, [name]   . FEVER, RECURRENT 08/11/2009    Qualifier: Diagnosis of  By: [name] MD, [name] MENOPAUSE 01/07/2007    Qualifier: Diagnosis of  By: [name], CMA, [place]    . Gastroparesis 06/03/2007    Qualifier: Diagnosis of  By: [name] MD, [name]   . GASTRIC OUTLET OBSTRUCTION 08/28/2007    In July 2008 she underwent laparoscopic enterolysis, Nissen fundoplication over a [id number] bougie, single pledgeted suture colosure of the hiatus and a 3 suture wrap.  Lap truncal vagotomy and a loop gastrojejunostomy was performed.  This was revised in August of 2009 to a roux en Y gastrojejujostomy.     . Chronic duodenal ulcer with  gastric outlet obstruction 06/29/2013  . Depression   . History of hiatal hernia   . Complication of anesthesia     pt states had too much xanax on board - agitated   . Anginal pain ([place])     "many many years ago"  . Exertional shortness of breath   . Pneumonia, organism unspecified 11/07/2010    Assoc with R Parapneumonic effusion 09/2010    - Tapped 10/05/10    - CxR resolved 11/07/2010    . Headache     "monthly" (09/06/2014)  . Arthritis     "hips" (09/06/2014)  . Myocardial infarction ([place])   . 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 Authorizing Provider  albuterol (PROVENTIL HFA;VENTOLIN HFA) 108 (90 Base) MCG/ACT inhaler Inhale 2 puffs into the lungs every 4 (four) hours as needed  (cough, shortness of breath or wheezing.). 08/01/15  Yes Shawnee Knapp, MD  ALPRAZolam [name]) 1 MG tablet Take 1 tablet (1 mg total) by mouth at bedtime as needed for anxiety. This is a 1 month supply 12/11/15  Yes Shawnee Knapp, MD  Aspirin-Acetaminophen-Caffeine (GOODY HEADACHE PO) Take 1 packet by mouth 4 (four) times daily as needed (pain). Reported on 12/08/2015   Yes Historical Provider, MD  benzonatate (TESSALON) 200 MG capsule Take 1 capsule (200 mg total) by mouth 3 (three) times daily as needed for cough. 10/28/15  Yes Shawnee Knapp, MD  budesonide-formoterol [facility]) 160-4.5 MCG/ACT inhaler Inhale 2 puffs into the lungs 2 (two) times daily. 10/28/15  Yes Shawnee Knapp, MD  Calcium Citrate-Vitamin D (CALCIUM CITRATE CHEWY BITE) 500-500 MG-UNIT CHEW Chew 1 tablet by mouth 2 (two) times daily. 04/17/15  Yes Shawnee Knapp, MD  DULoxetine (CYMBALTA) 60 MG capsule Take 1 capsule (60 mg total) by mouth daily. 10/28/15  Yes Shawnee Knapp, MD  feeding supplement (BOOST / RESOURCE BREEZE) LIQD Take 1 Container by mouth 3 (three) times daily between meals. 12/16/15  Yes Shawnee Knapp, MD  fluticasone (FLONASE) 50 MCG/ACT nasal spray Place 2 sprays into both nostrils daily. 10/20/15  Yes Posey Boyer, MD  guaiFENesin-codeine 100-10 MG/5ML syrup Take 10 mLs by mouth every 4 (four) hours as needed for cough. 12/08/15  Yes Shawnee Knapp, MD  HYDROcodone-acetaminophen (NORCO) 10-325 MG tablet Take 1-2 tablets by mouth every 6 (six) hours as needed for moderate pain. This is a 1 week supply. Ok to fill Saturday 01/06/16 12/16/15  Yes Shawnee Knapp, MD  ipratropium (ATROVENT) 0.03 % nasal spray Place 2 sprays into both nostrils 2 (two) times daily. 10/20/15  Yes Posey Boyer, MD  ipratropium-albuterol (DUONEB) 0.5-2.5 (3) MG/3ML SOLN Take 3 mLs by nebulization 4 (four) times daily -  before meals and at bedtime. 06/03/15  Yes Robyn Haber, MD  mupirocin ointment (BACTROBAN) 2 % Apply 1 application topically 3 (three) times daily. 12/11/15   Yes Shawnee Knapp, MD  OLANZapine (ZYPREXA) 10 MG tablet Take 1 tablet (10 mg total) by mouth at bedtime. 10/28/15  Yes Shawnee Knapp, MD  omeprazole (PRILOSEC) 40 MG capsule TAKE ONE CAPSULE BY MOUTH TWICE DAILY 10/10/15  Yes Shawnee Knapp, MD  ondansetron (ZOFRAN ODT) 4 MG disintegrating tablet Take 1-2 tablets (4-8 mg total) by mouth every 8 (eight) hours as needed for nausea or vomiting. 12/11/15  Yes Shawnee Knapp, MD  UNABLE TO FIND Place 1 application into the nose  once. Med Name: best Fit evaluation for oxygen, possibly simply go mini 08/18/15  Yes Mancel Bale, PA-C  UNABLE TO FIND Med Name: Best fit evaluation for oxygen - possible simply go mini 08/18/15  Yes Mancel Bale, PA-C   Allergies  Allergen Reactions  . Lithium Nausea And Vomiting  . Celebrex [Celecoxib] Other (See Comments)    History of bleeding ulcers  . Morphine Itching  . Quetiapine Other (See Comments)     tardive dyskinesia  . Varenicline Tartrate Other (See Comments)    hallucinations    Review of Systems  Constitutional: Positive for fever and fatigue. Negative for chills and diaphoresis.  Respiratory: Positive for cough. Negative for shortness of breath and wheezing.   Gastrointestinal: Negative for nausea, vomiting, abdominal pain, diarrhea and constipation.  Genitourinary: Negative for dysuria and urgency.       Objective:   Physical Exam  Constitutional: She is oriented to person, place, and time. She appears well-developed and well-nourished. No distress.  HENT:  Head: Normocephalic and atraumatic.  Right Ear: Tympanic membrane normal.  Left Ear: Tympanic membrane normal.  Mouth/Throat: Mucous membranes are dry. Posterior oropharyngeal erythema present.  nares pale and boggy  Eyes: EOM are normal. Pupils are equal, round, and reactive to light.  Neck: Neck supple.  Cardiovascular: Normal rate, regular rhythm, S1 normal, S2 normal and normal heart sounds.   No murmur heard. Pulmonary/Chest: Effort normal and  breath sounds normal. No respiratory distress.  Musculoskeletal: Normal range of motion. She exhibits no edema.  Neurological: She is alert and oriented to person, place, and time.  Skin: Skin is warm and dry.  Psychiatric: She has a normal mood and affect. Her behavior is normal.  Nursing note and vitals reviewed.   BP 110/72 mmHg  Pulse 76  Temp(Src) 98.6 F (37 C) (Oral)  Resp 16  Ht 5\' 3"  (1.6 m)  Wt 101 lb 3.2 oz (45.904 kg)  BMI 17.93 kg/m2  SpO2 97%   Results for orders placed or performed in visit on 12/18/15  POCT urinalysis dipstick  Result Value Ref Range   Color, UA yellow yellow   Clarity, UA clear clear   Glucose, UA negative negative   Bilirubin, UA negative negative   Ketones, POC UA negative negative   Spec Grav, UA <=1.005    Blood, UA negative negative   pH, UA 5.0    Protein Ur, POC negative negative   Urobilinogen, UA 0.2    Nitrite, UA Negative Negative   Leukocytes, UA Negative Negative  POCT CBC  Result Value Ref Range   WBC 7.2 4.6 - 10.2 K/uL   Lymph, poc 2.6 0.6 - 3.4   POC LYMPH PERCENT 35.9 10 - 50 %L   MID (cbc) 0.7 0 - 0.9   POC MID % 9.6 0 - 12 %M   POC Granulocyte 3.9 2 - 6.9   Granulocyte percent 54.5 37 - 80 %G   RBC 3.96 (A) 4.04 - 5.48 M/uL   Hemoglobin 12.5 12.2 - 16.2 g/dL   HCT, POC 36.0 (A) 37.7 - 47.9 %   MCV 91.1 80 - 97 fL   MCH, POC 31.7 (A) 27 - 31.2 pg   MCHC 34.8 31.8 - 35.4 g/dL   RDW, POC 16.2 %   Platelet Count, POC 289 142 - 424 K/uL   MPV 7.5 0 - 99.8 fL  POCT Microscopic Urinalysis ([facility])  Result Value Ref Range   WBC,UR,HPF,POC None None WBC/hpf  RBC,UR,HPF,POC None None RBC/hpf   Bacteria None None, Too numerous to count   Mucus Absent Absent   Epithelial Cells, UR Per Microscopy None None, Too numerous to count cells/hpf       Assessment & Plan:   1. Fever, unspecified   Unknown etiology, exam at baseline. Cont watchful waiting. Has neb machine and oxygen at home in case copd flairs.  Orders  Placed This Encounter  Procedures  . Comprehensive metabolic panel  . POCT urinalysis dipstick  . POCT CBC  . POCT SEDIMENTATION RATE  . POCT Microscopic Urinalysis ([facility])     I personally performed the services described in this documentation, which was scribed in my presence. The recorded information has been reviewed and considered, and addended by me as needed.   [name], M.D.  [facility] [address] [place], [place] [address] [phone] phone [phone] fax  12/24/2015 7:55 AM

## 2015-12-18 NOTE — Patient Instructions (Addendum)
Sleep, eat lots of fruits, veggies, and protein. Wash your hands, get a little exercise (go out into the yard with the dogs).  If worse, come back.    IF you received an x-ray today, you will receive an invoice from [facility]. Please contact [facility] at [phone] with questions or concerns regarding your invoice.   IF you received labwork today, you will receive an invoice from [employer]. Please contact [facility] at [phone] with questions or concerns regarding your invoice.   Our billing staff will not be able to assist you with questions regarding bills from these companies.  You will be contacted with the lab results as soon as they are available. The fastest way to get your results is to activate your My Chart account. Instructions are located on the last page of this paperwork. If you have not heard from [place] regarding the results in 2 weeks, please contact this office.    Fever, Adult A fever is an increase in the body's temperature. It is usually defined as a temperature of 100F (38C) or higher. Brief mild or moderate fevers generally have no long-term effects, and they often do not require treatment. Moderate or high fevers may make you feel uncomfortable and can sometimes be a sign of a serious illness or disease. The sweating that may occur with repeated or prolonged fever may also cause dehydration. Fever is confirmed by taking a temperature with a thermometer. A measured temperature can vary with:  Age.  Time of day.  Location of the thermometer:  Mouth (oral).  Rectum (rectal).  Ear (tympanic).  Underarm (axillary).  Forehead (temporal). HOME CARE INSTRUCTIONS Pay attention to any changes in your symptoms. Take these actions to help with your condition:  Take over-the counter and prescription medicines only as told by your health care provider. Follow the dosing instructions carefully.  If you were prescribed  an antibiotic medicine, take it as told by your health care provider. Do not stop taking the antibiotic even if you start to feel better.  Rest as needed.  Drink enough fluid to keep your urine clear or pale yellow. This helps to prevent dehydration.  Sponge yourself or bathe with room-temperature water to help reduce your body temperature as needed. Do not use ice water.  Do not overbundle yourself in blankets or heavy clothes. SEEK MEDICAL CARE IF:  You vomit.  You cannot eat or drink without vomiting.  You have diarrhea.  You have pain when you urinate.  Your symptoms do not improve with treatment.  You develop new symptoms.  You develop excessive weakness. SEEK IMMEDIATE MEDICAL CARE IF:  You have shortness of breath or have trouble breathing.  You are dizzy or you faint.  You are disoriented or confused.  You develop signs of dehydration, such as a dry mouth, decreased urination, or paleness.  You develop severe pain in your abdomen.  You have persistent vomiting or diarrhea.  You develop a skin rash.  Your symptoms suddenly get worse.   This information is not intended to replace advice given to you by your health care provider. Make sure you discuss any questions you have with your health care provider.   Document Released: 12/04/2000 Document Revised: 03/01/2015 Document Reviewed: 08/04/2014 Elsevier Interactive Patient Education [employer].

## 2015-12-19 ENCOUNTER — Telehealth: Payer: Self-pay

## 2015-12-19 ENCOUNTER — Ambulatory Visit (INDEPENDENT_AMBULATORY_CARE_PROVIDER_SITE_OTHER): Payer: Medicare Other | Admitting: Family Medicine

## 2015-12-19 VITALS — BP 102/64 | HR 83 | Temp 97.7°F | Resp 17 | Ht 63.0 in | Wt 105.0 lb

## 2015-12-19 DIAGNOSIS — Z720 Tobacco use: Secondary | ICD-10-CM | POA: Diagnosis not present

## 2015-12-19 DIAGNOSIS — R509 Fever, unspecified: Secondary | ICD-10-CM

## 2015-12-19 DIAGNOSIS — G894 Chronic pain syndrome: Secondary | ICD-10-CM

## 2015-12-19 DIAGNOSIS — J440 Chronic obstructive pulmonary disease with acute lower respiratory infection: Secondary | ICD-10-CM | POA: Diagnosis not present

## 2015-12-19 DIAGNOSIS — F331 Major depressive disorder, recurrent, moderate: Secondary | ICD-10-CM

## 2015-12-19 DIAGNOSIS — R69 Illness, unspecified: Secondary | ICD-10-CM

## 2015-12-19 DIAGNOSIS — R6889 Other general symptoms and signs: Secondary | ICD-10-CM

## 2015-12-19 LAB — COMPREHENSIVE METABOLIC PANEL
ALBUMIN: 3.9 g/dL (ref 3.6–5.1)
ALK PHOS: 80 U/L (ref 33–130)
ALT: 12 U/L (ref 6–29)
AST: 14 U/L (ref 10–35)
BILIRUBIN TOTAL: 0.3 mg/dL (ref 0.2–1.2)
BUN: 9 mg/dL (ref 7–25)
CALCIUM: 8.4 mg/dL — AB (ref 8.6–10.4)
CO2: 28 mmol/L (ref 20–31)
Chloride: 97 mmol/L — ABNORMAL LOW (ref 98–110)
Creat: 0.94 mg/dL (ref 0.50–0.99)
Glucose, Bld: 79 mg/dL (ref 65–99)
Potassium: 3.7 mmol/L (ref 3.5–5.3)
Sodium: 136 mmol/L (ref 135–146)
TOTAL PROTEIN: 6.2 g/dL (ref 6.1–8.1)

## 2015-12-19 NOTE — Progress Notes (Signed)
By signing my name below, I, [name], attest that this documentation has been prepared under the direction and in the presence of [name], MD.  Electronically Signed: [name], Medical Scribe. 12/19/2015. 10:42 AM.  Subjective:    Patient ID: [name], female    DOB: September 01, 1950, 65 y.o.   MRN: [id number]  HPI Chief Complaint  Patient presents with  . Fever    HPI Comments: [name] is a 65 y.o. female who presents to the [facility] complaining of fever. Pt states she's been sleeping fine. Pt is fatigue. Pt had a fever of 99 this morning. Pt is suing her inhalers. Pt reports itchy puffy feet. Pt denies taking potassium.  Patient Active Problem List   Diagnosis Date Noted  . Abdominal pain   . Hypoxia 04/08/2015  . Opioid use with withdrawal ([place])   . Protein-calorie malnutrition ([place])   . Episode of recurrent major depressive disorder ([place])   . Secondary hyperparathyroidism ([place]) 01/10/2015  . Vitamin D deficiency 01/10/2015  . 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 Anemia, iron deficiency 11/03/2014  . Chronic pain syndrome 10/24/2014  . Osteoporosis 10/05/2014  . Vertebral compression fracture ([place]) 10/05/2014  . Coronary artery disease due to calcified coronary lesion 10/05/2014  . Weight loss   . Tobacco abuse   . History of subtotal gastrectomy with Roux-en-Y recontruction   . Orthostatic hypotension 08/11/2014  . Severe protein-calorie malnutrition ([place]) 12/30/2013  . Constipation, chronic 07/10/2013  . Chronic abdominal pain   . COPD (chronic obstructive pulmonary disease) ([place]) 11/07/2010  . Major depressive disorder, recurrent episode, moderate ([place]) 06/06/2010  . 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 Anxiety state 01/07/2007  . BARRETT'S ESOPHAGUS, HX OF 07/03/2005   Past Medical History  Diagnosis Date  . Peptic ulcer disease     dr Oletta Lamas  . Acute duodenal ulcer with hemorrhage and perforation, with  obstruction ([place])   . Barrett's esophagus   . Menopause   . GERD (gastroesophageal reflux disease)   . Asthma   . Chronic abdominal pain     narcotic dependence, dr gyarteng-dak at [facility]  . Narcotic abuse   . Anxiety     sees [name] NP at Dr. Radonna Ricker office  . Fx two ribs-open 08-24-11    left 4th and 5th  . Fx of fibula 07-28-11    left fibula, 2 places   . COPD (chronic obstructive pulmonary disease) ([place])   . Allergy   . Anemia   . History of blood transfusion     "related to OR; maybe when I perforated that ulcer"  . Lap Nissen + truncal vagotomy July 2008 05/13/2013  . LEG EDEMA 01/19/2008    Qualifier: Diagnosis of  By: [name] MD, [name]   . ACUTE DUODEN ULCER W/HEMORR PERF&OBSTRUCTION 06/26/2004    Qualifier: Diagnosis of  By: [name] MD, [name]   . FEVER, RECURRENT 08/11/2009    Qualifier: Diagnosis of  By: [name] MD, [name] MENOPAUSE 01/07/2007    Qualifier: Diagnosis of  By: [name], CMA, [place]    . Gastroparesis 06/03/2007    Qualifier: Diagnosis of  By: [name] MD, [name]   . GASTRIC OUTLET OBSTRUCTION 08/28/2007    In July 2008 she underwent laparoscopic enterolysis, Nissen fundoplication over a [id number] bougie, single pledgeted suture colosure of the hiatus and a 3 suture wrap.  Lap truncal vagotomy and a loop gastrojejunostomy was performed.  This was revised in August of 2009 to a roux en Y gastrojejujostomy.     . 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 Depression   . History of hiatal hernia   . Complication of anesthesia     pt states had too much xanax on board - agitated   . Anginal pain ([place])     "many many years ago"  . Exertional shortness of breath   . Pneumonia, organism unspecified 11/07/2010    Assoc with R Parapneumonic effusion 09/2010    - Tapped 10/05/10    - CxR resolved 11/07/2010    . Headache     "monthly" (09/06/2014)  . Arthritis     "hips" (09/06/2014)  . Myocardial infarction ([place])   . S/P jejunostomy  Feb 2016 07/26/2014   Past Surgical History  Procedure Laterality Date  . Esophagogastroduodenoscopy  12-29-09    dr qadeer at [facility], several gastric ulcers  . Laparoscopic nissen fundoplication    . Gastrojejunostomy  02-20-08    Roux en Y gastrojejunsotomy w 1 foot Roux limb  . Repair of perforated ulcer    . Biopsy thyroid  08-13-11    benign nodule, per Dr. Melida Quitter   . Esophagogastroduodenoscopy N/A 09/25/2012    Procedure: ESOPHAGOGASTRODUODENOSCOPY (EGD);  Surgeon: Beryle Beams, MD;  Location: [facility];  Service: Endoscopy;  Laterality: N/A;  . Fracture surgery    . Tonsillectomy    . Cataract extraction w/ intraocular lens  implant, bilateral Bilateral   . Laparoscopic partial gastrectomy N/A 06/29/2013    Procedure: Gastrectomy and GJ Tube placement;  Surgeon: Pedro Earls, MD;  Location: [facility];  Service: General;  Laterality: N/A;  . Colonoscopy    . Gastrostomy N/A 07/26/2014    Procedure: LAPRASCOPIC ASSISTED OPEN PLACEMENT OF JEJUNOSTOMY TUBE;  Surgeon: Pedro Earls, MD;  Location: [facility];  Service: General;  Laterality: N/A;  . Orif hip fracture Bilateral     "pins in both of them"  . Dilation and curettage of uterus    . Hernia repair      "hiatal"  . Joint replacement     Allergies  Allergen Reactions  . Lithium Nausea And Vomiting  . Celebrex [Celecoxib] Other (See Comments)    History of bleeding ulcers  . Morphine Itching  . Quetiapine Other (See Comments)     tardive dyskinesia  . Varenicline Tartrate Other (See Comments)    hallucinations   Prior to Admission medications   Medication Sig Start Date End Date Taking? Authorizing Provider  albuterol (PROVENTIL HFA;VENTOLIN HFA) 108 (90 Base) MCG/ACT inhaler Inhale 2 puffs into the lungs every 4 (four) hours as needed (cough, shortness of breath or wheezing.). 08/01/15   Shawnee Knapp, MD  ALPRAZolam [name]) 1 MG tablet Take 1 tablet (1 mg total) by mouth at bedtime as needed for anxiety. This is a 1  month supply 12/11/15   Shawnee Knapp, MD  Aspirin-Acetaminophen-Caffeine (GOODY HEADACHE PO) Take 1 packet by mouth 4 (four) times daily as needed (pain). Reported on 12/08/2015    Historical Provider, MD  benzonatate (TESSALON) 200 MG capsule Take 1 capsule (200 mg total) by mouth 3 (three) times daily as needed for cough. 10/28/15   Shawnee Knapp, MD  budesonide-formoterol [facility]) 160-4.5 MCG/ACT inhaler Inhale 2 puffs into the lungs 2 (two) times daily. 10/28/15   Shawnee Knapp, MD  Calcium Citrate-Vitamin D (CALCIUM CITRATE CHEWY BITE) 500-500 MG-UNIT CHEW Chew 1  tablet by mouth 2 (two) times daily. 04/17/15   Shawnee Knapp, MD  DULoxetine (CYMBALTA) 60 MG capsule Take 1 capsule (60 mg total) by mouth daily. 10/28/15   Shawnee Knapp, MD  feeding supplement (BOOST / RESOURCE BREEZE) LIQD Take 1 Container by mouth 3 (three) times daily between meals. 12/16/15   Shawnee Knapp, MD  fluticasone (FLONASE) 50 MCG/ACT nasal spray Place 2 sprays into both nostrils daily. 10/20/15   Posey Boyer, MD  guaiFENesin-codeine 100-10 MG/5ML syrup Take 10 mLs by mouth every 4 (four) hours as needed for cough. 12/08/15   Shawnee Knapp, MD  HYDROcodone-acetaminophen (NORCO) 10-325 MG tablet Take 1-2 tablets by mouth every 6 (six) hours as needed for moderate pain. This is a 1 week supply. Ok to fill Saturday 01/06/16 12/16/15   Shawnee Knapp, MD  ipratropium (ATROVENT) 0.03 % nasal spray Place 2 sprays into both nostrils 2 (two) times daily. 10/20/15   Posey Boyer, MD  ipratropium-albuterol (DUONEB) 0.5-2.5 (3) MG/3ML SOLN Take 3 mLs by nebulization 4 (four) times daily -  before meals and at bedtime. 06/03/15   Robyn Haber, MD  mupirocin ointment (BACTROBAN) 2 % Apply 1 application topically 3 (three) times daily. 12/11/15   Shawnee Knapp, MD  OLANZapine (ZYPREXA) 10 MG tablet Take 1 tablet (10 mg total) by mouth at bedtime. 10/28/15   Shawnee Knapp, MD  omeprazole (PRILOSEC) 40 MG capsule TAKE ONE CAPSULE BY MOUTH TWICE DAILY 10/10/15   Shawnee Knapp,  MD  ondansetron (ZOFRAN ODT) 4 MG disintegrating tablet Take 1-2 tablets (4-8 mg total) by mouth every 8 (eight) hours as needed for nausea or vomiting. 12/11/15   Shawnee Knapp, MD  UNABLE TO FIND Place 1 application into the nose once. Med Name: best Fit evaluation for oxygen, possibly simply go mini 08/18/15   Mancel Bale, PA-C  UNABLE TO FIND Med Name: Best fit evaluation for oxygen - possible simply go mini 08/18/15   Mancel Bale, PA-C   Social History   Social History  . Marital Status: Divorced    Spouse Name: N/A  . Number of Children: N/A  . Years of Education: N/A   Occupational History  . Not on file.   Social History Main Topics  . Smoking status: Current Every Day Smoker -- 2.00 packs/day for 50 years    Types: Cigarettes  . Smokeless tobacco: Never Used  . Alcohol Use: No  . Drug Use: No  . Sexual Activity: No   Other Topics Concern  . Not on file   Social History Narrative   Depression screen [facility] 2/9 12/19/2015 12/18/2015 12/11/2015 12/08/2015 12/07/2015  Decreased Interest 0 0 0 0 0  Down, Depressed, Hopeless 0 0 0 0 0  PHQ - 2 Score 0 0 0 0 0  Altered sleeping - - - 0 -  Tired, decreased energy - - - 0 -  Change in appetite - - - 0 -  Feeling bad or failure about yourself  - - - 0 -  Trouble concentrating - - - 0 -  Moving slowly or fidgety/restless - - - 0 -  Suicidal thoughts - - - 0 -  PHQ-9 Score - - - 0 -  Difficult doing work/chores - - - - -   Review of Systems  Constitutional: Positive for fever, chills, activity change and fatigue. Negative for appetite change and unexpected weight change.  HENT: Negative for congestion, rhinorrhea, sinus  pressure and sore throat.   Respiratory: Negative for cough, chest tightness, shortness of breath and wheezing.   Cardiovascular: Negative for chest pain, palpitations and leg swelling.  Gastrointestinal: Positive for nausea and abdominal pain. Negative for vomiting, diarrhea, constipation and abdominal distention.    Musculoskeletal: Positive for back pain, arthralgias and gait problem. Negative for joint swelling.  Neurological: Positive for weakness.  Psychiatric/Behavioral: Positive for behavioral problems, confusion, dysphoric mood, decreased concentration and agitation. Negative for hallucinations and sleep disturbance. The patient is not hyperactive.    Objective:  BP 102/64 mmHg  Pulse 83  Temp(Src) 97.7 F (36.5 C) (Oral)  Resp 17  Ht 5\' 3"  (1.6 m)  Wt 105 lb (47.628 kg)  BMI 18.60 kg/m2  SpO2 93%  Physical Exam  Constitutional: She appears well-developed and well-nourished. No distress.  HENT:  Head: Normocephalic and atraumatic.  Eyes: Conjunctivae are normal.  Neck: Neck supple.  Cardiovascular: Normal rate.   Pulmonary/Chest: Effort normal and breath sounds normal. No respiratory distress. She has no wheezes. She has no rales.  Neurological: She is alert.  Skin: Skin is warm and dry.  Psychiatric: She has a normal mood and affect. Her behavior is normal.  Nursing note and vitals reviewed.  Assessment & Plan:   1. Fever, unspecified   2. Feeling bad   3. Major depressive disorder, recurrent episode, moderate (HCC)   4. Tobacco abuse   5. Chronic obstructive pulmonary disease with acute lower respiratory infection ([place])    Exam reassuring and pt w/o any specific complaints other than a low-grade fever. Cont watchful waiting. Has oxygen and nebs avail at home in case copd flaires  I personally performed the services described in this documentation, which was scribed in my presence. The recorded information has been reviewed and considered, and addended by me as needed.   [name], M.D.  [facility] [address] [place], [place] [address] [phone] phone [phone] fax  12/25/2015 7:10 AM

## 2015-12-19 NOTE — Telephone Encounter (Signed)
Patient is requesting a refill for hydrocodone. She states she usually picks it up every week or two.

## 2015-12-19 NOTE — Patient Instructions (Addendum)
Take a lasix today.  Make sure you eat plenty of potassium food.   IF you received an x-ray today, you will receive an invoice from [facility]. Please contact [facility] at [phone] with questions or concerns regarding your invoice.   IF you received labwork today, you will receive an invoice from [employer]. Please contact [facility] at [phone] with questions or concerns regarding your invoice.   Our billing staff will not be able to assist you with questions regarding bills from these companies.  You will be contacted with the lab results as soon as they are available. The fastest way to get your results is to activate your My Chart account. Instructions are located on the last page of this paperwork. If you have not heard from [place] regarding the results in 2 weeks, please contact this office.    Potassium Content of Foods Potassium is a mineral found in many foods and drinks. It helps keep fluids and minerals balanced in your body and affects how steadily your heart beats. Potassium also helps control your blood pressure and keep your muscles and nervous system healthy. Certain health conditions and medicines may change the balance of potassium in your body. When this happens, you can help balance your level of potassium through the foods that you do or do not eat. Your health care provider or dietitian may recommend an amount of potassium that you should have each day. The following lists of foods provide the amount of potassium (in parentheses) per serving in each item. HIGH IN POTASSIUM  The following foods and beverages have 200 mg or more of potassium per serving:  Apricots, 2 raw or 5 dry (200 mg).  Artichoke, 1 medium (345 mg).  Avocado, raw,  each (245 mg).  Banana, 1 medium (425 mg).  Beans, lima, or baked beans, canned,  cup (280 mg).  Beans, white, canned,  cup (595 mg).  Beef roast, 3 oz (320 mg).  Beef, ground, 3  oz (270 mg).  Beets, raw or cooked,  cup (260 mg).  Bran muffin, 2 oz (300 mg).  Broccoli,  cup (230 mg).  Brussels sprouts,  cup (250 mg).  Cantaloupe,  cup (215 mg).  Cereal, 100% bran,  cup (200-400 mg).  Cheeseburger, single, fast food, 1 each (225-400 mg).  Chicken, 3 oz (220 mg).  Clams, canned, 3 oz (535 mg).  Crab, 3 oz (225 mg).  Dates, 5 each (270 mg).  Dried beans and peas,  cup (300-475 mg).  Figs, dried, 2 each (260 mg).  Fish: halibut, tuna, cod, snapper, 3 oz (480 mg).  Fish: salmon, haddock, swordfish, perch, 3 oz (300 mg).  Fish, tuna, canned 3 oz (200 mg).  [place] fries, fast food, 3 oz (470 mg).  Granola with fruit and nuts,  cup (200 mg).  Grapefruit juice,  cup (200 mg).  Greens, beet,  cup (655 mg).  Honeydew melon,  cup (200 mg).  Kale, raw, 1 cup (300 mg).  Kiwi, 1 medium (240 mg).  Kohlrabi, rutabaga, parsnips,  cup (280 mg).  Lentils,  cup (365 mg).  Mango, 1 each (325 mg).  Milk, chocolate, 1 cup (420 mg).  Milk: nonfat, low-fat, whole, buttermilk, 1 cup (350-380 mg).  Molasses, 1 Tbsp (295 mg).  Mushrooms,  cup (280) mg.  Nectarine, 1 each (275 mg).  Nuts: almonds, peanuts, hazelnuts, [place], cashew, mixed, 1 oz (200 mg).  Nuts, pistachios, 1 oz (295 mg).  Orange, 1 each (240 mg).  Orange juice,  cup (235 mg).  Papaya, medium,  fruit (390 mg).  Peanut butter, chunky, 2 Tbsp (240 mg).  Peanut butter, smooth, 2 Tbsp (210 mg).  Pear, 1 medium (200 mg).  Pomegranate, 1 whole (400 mg).  Pomegranate juice,  cup (215 mg).  Pork, 3 oz (350 mg).  Potato chips, salted, 1 oz (465 mg).  Potato, baked with skin, 1 medium (925 mg).  Potatoes, boiled,  cup (255 mg).  Potatoes, mashed,  cup (330 mg).  Prune juice,  cup (370 mg).  Prunes, 5 each (305 mg).  Pudding, chocolate,  cup (230 mg).  Pumpkin, canned,  cup (250 mg).  Raisins, seedless,  cup (270 mg).  Seeds, sunflower or  pumpkin, 1 oz (240 mg).  Soy milk, 1 cup (300 mg).  Spinach,  cup (420 mg).  Spinach, canned,  cup (370 mg).  Sweet potato, baked with skin, 1 medium (450 mg).  Swiss chard,  cup (480 mg).  Tomato or vegetable juice,  cup (275 mg).  Tomato sauce or puree,  cup (400-550 mg).  Tomato, raw, 1 medium (290 mg).  Tomatoes, canned,  cup (200-300 mg).  [place], 3 oz (250 mg).  Wheat germ, 1 oz (250 mg).  Winter squash,  cup (250 mg).  Yogurt, plain or fruited, 6 oz (260-435 mg).  Zucchini,  cup (220 mg). MODERATE IN POTASSIUM The following foods and beverages have 50-200 mg of potassium per serving:  Apple, 1 each (150 mg).  Apple juice,  cup (150 mg).  Applesauce,  cup (90 mg).  Apricot nectar,  cup (140 mg).  Asparagus, small spears,  cup or 6 spears (155 mg).  Bagel, cinnamon raisin, 1 each (130 mg).  Bagel, egg or plain, 4 in., 1 each (70 mg).  Beans, green,  cup (90 mg).  Beans, yellow,  cup (190 mg).  Beer, regular, 12 oz (100 mg).  Beets, canned,  cup (125 mg).  Blackberries,  cup (115 mg).  Blueberries,  cup (60 mg).  Bread, whole wheat, 1 slice (70 mg).  Broccoli, raw,  cup (145 mg).  Cabbage,  cup (150 mg).  Carrots, cooked or raw,  cup (180 mg).  Cauliflower, raw,  cup (150 mg).  Celery, raw,  cup (155 mg).  Cereal, bran flakes, cup (120-150 mg).  Cheese, cottage,  cup (110 mg).  Cherries, 10 each (150 mg).  Chocolate, 1 oz bar (165 mg).  Coffee, brewed 6 oz (90 mg).  Corn,  cup or 1 ear (195 mg).  Cucumbers,  cup (80 mg).  Egg, large, 1 each (60 mg).  Eggplant,  cup (60 mg).  Endive, raw, cup (80 mg).  English muffin, 1 each (65 mg).  Fish, orange roughy, 3 oz (150 mg).  Frankfurter, beef or pork, 1 each (75 mg).  Fruit cocktail,  cup (115 mg).  Grape juice,  cup (170 mg).  Grapefruit,  fruit (175 mg).  Grapes,  cup (155 mg).  Greens: kale, turnip, collard,  cup (110-150  mg).  Ice cream or frozen yogurt, chocolate,  cup (175 mg).  Ice cream or frozen yogurt, vanilla,  cup (120-150 mg).  Lemons, limes, 1 each (80 mg).  Lettuce, all types, 1 cup (100 mg).  Mixed vegetables,  cup (150 mg).  Mushrooms, raw,  cup (110 mg).  Nuts: walnuts, pecans, or macadamia, 1 oz (125 mg).  Oatmeal,  cup (80 mg).  Okra,  cup (110 mg).  Onions, raw,  cup (120 mg).  Peach, 1 each (  185 mg).  Peaches, canned,  cup (120 mg).  Pears, canned,  cup (120 mg).  Peas, green, frozen,  cup (90 mg).  Peppers, green,  cup (130 mg).  Peppers, red,  cup (160 mg).  Pineapple juice,  cup (165 mg).  Pineapple, fresh or canned,  cup (100 mg).  Plums, 1 each (105 mg).  Pudding, vanilla,  cup (150 mg).  Raspberries,  cup (90 mg).  Rhubarb,  cup (115 mg).  Rice, wild,  cup (80 mg).  Shrimp, 3 oz (155 mg).  Spinach, raw, 1 cup (170 mg).  Strawberries,  cup (125 mg).  Summer squash  cup (175-200 mg).  Swiss chard, raw, 1 cup (135 mg).  Tangerines, 1 each (140 mg).  Tea, brewed, 6 oz (65 mg).  Turnips,  cup (140 mg).  Watermelon,  cup (85 mg).  Wine, red, table, 5 oz (180 mg).  Wine, white, table, 5 oz (100 mg). LOW IN POTASSIUM The following foods and beverages have less than 50 mg of potassium per serving.  Bread, white, 1 slice (30 mg).  Carbonated beverages, 12 oz (less than 5 mg).  Cheese, 1 oz (20-30 mg).  Cranberries,  cup (45 mg).  Cranberry juice cocktail,  cup (20 mg).  Fats and oils, 1 Tbsp (less than 5 mg).  Hummus, 1 Tbsp (32 mg).  Nectar: papaya, mango, or pear,  cup (35 mg).  Rice, white or brown,  cup (50 mg).  Spaghetti or macaroni,  cup cooked (30 mg).  Tortilla, flour or corn, 1 each (50 mg).  Waffle, 4 in., 1 each (50 mg).  Water chestnuts,  cup (40 mg).   This information is not intended to replace advice given to you by your health care provider. Make sure you discuss any questions  you have with your health care provider.   Document Released: 01/22/2005 Document Revised: 06/15/2013 Document Reviewed: 05/07/2013 Elsevier Interactive Patient Education [employer].

## 2015-12-20 DIAGNOSIS — J449 Chronic obstructive pulmonary disease, unspecified: Secondary | ICD-10-CM | POA: Diagnosis not present

## 2015-12-21 NOTE — Telephone Encounter (Signed)
Dr Brigitte Pulse wrote Rxs on 6/24 to be filled on 7/1, 7/8, and 7/15. Does pt have these rxs?

## 2015-12-25 NOTE — Telephone Encounter (Signed)
Pt p/up Rxs on 7/1.

## 2015-12-29 DIAGNOSIS — J449 Chronic obstructive pulmonary disease, unspecified: Secondary | ICD-10-CM | POA: Diagnosis not present

## 2016-01-02 ENCOUNTER — Ambulatory Visit: Payer: Medicare Other

## 2016-01-08 ENCOUNTER — Ambulatory Visit: Payer: Medicare Other

## 2016-01-10 ENCOUNTER — Ambulatory Visit (INDEPENDENT_AMBULATORY_CARE_PROVIDER_SITE_OTHER): Payer: Medicare Other | Admitting: Family Medicine

## 2016-01-10 ENCOUNTER — Telehealth: Payer: Self-pay

## 2016-01-10 VITALS — BP 118/76 | HR 79 | Temp 98.7°F | Resp 18 | Ht 63.0 in | Wt 104.0 lb

## 2016-01-10 DIAGNOSIS — G894 Chronic pain syndrome: Secondary | ICD-10-CM

## 2016-01-10 DIAGNOSIS — F17200 Nicotine dependence, unspecified, uncomplicated: Secondary | ICD-10-CM | POA: Diagnosis not present

## 2016-01-10 DIAGNOSIS — F4323 Adjustment disorder with mixed anxiety and depressed mood: Secondary | ICD-10-CM

## 2016-01-10 MED ORDER — ALPRAZOLAM 1 MG PO TABS: 1.0000 mg | ORAL_TABLET | Freq: Two times a day (BID) | ORAL | Status: DC | PRN

## 2016-01-10 MED ORDER — HYDROCODONE-ACETAMINOPHEN 10-325 MG PO TABS: 1.0000 | ORAL_TABLET | Freq: Four times a day (QID) | ORAL | Status: DC | PRN

## 2016-01-10 MED ORDER — VARENICLINE TARTRATE 0.5 MG X 11 & 1 MG X 42 PO MISC: ORAL | Status: DC

## 2016-01-10 NOTE — Telephone Encounter (Signed)
LOL.  She asked me to change her alprazolam to fill weekly along with her hydrocodone. She has 14 tabs to last her for the next 10d and then she will get 10 tabs every Saturday (next to be filled on July 29th).  This is actually more alprazolam than I had her on previously of #30 for a 1 mo supply.  This was explained to the pt and to the pharmacist. Pt was given 4 different alprazolam rxs on 1 sheet of paper to be filled today 7/19, 7/29, 8/5, and 8/12. She will leave all of these with the pharmacist and then go to [facility] every Sat for her med fills.

## 2016-01-10 NOTE — Telephone Encounter (Signed)
Dr Shaw

## 2016-01-10 NOTE — Patient Instructions (Addendum)
     IF you received an x-ray today, you will receive an invoice from [facility]. Please contact [facility] at [phone] with questions or concerns regarding your invoice.   IF you received labwork today, you will receive an invoice from [facility]. Please contact [facility] at [phone] with questions or concerns regarding your invoice.   Our billing staff will not be able to assist you with questions regarding bills from these companies.  You will be contacted with the lab results as soon as they are available. The fastest way to get your results is to activate your My Chart account. Instructions are located on the last page of this paperwork. If you have not heard from us regarding the results in 2 weeks, please contact this office.     We recommend that you schedule a mammogram for breast cancer screening. Typically, you do not need a referral to do this. Please contact a local imaging center to schedule your mammogram.  [facility] - [phone]  *ask for the Radiology Department [facility]) - [phone] or [phone]  [facility] - [phone] [facility] - [phone] [facility] - [phone]  *ask for the Radiology Department [facility] - [phone]  *ask for the Radiology Department [facility] - [phone]  *ask for the Mammography Department [facility] - [phone]  

## 2016-01-10 NOTE — Telephone Encounter (Signed)
Msg is for Dr. Brigitte Pulse, pt wants to know why she has only 14 xanax for the month with no refills.  Please advise  [phone]

## 2016-01-10 NOTE — Progress Notes (Signed)
By signing my name below I, Tereasa Coop, attest that this documentation has been prepared under the direction and in the presence of [name], MD. Electonically Signed. Tereasa Coop, Scribe 01/10/2016 at 12:00 PM  Subjective:    Patient ID: [name], female    DOB: January 25, 1951, 65 y.o.   MRN: [id number]  Chief Complaint  Patient presents with  . Medication Problem    xanax and hydrocodone    HPI [name] is a 65 y.o. female who presents to the [facility] to discuss her xanax and hydrocodone medication prescriptions.  Pt reports that she has history of depression and she wants to discuss taking chantix. Pt states that she took chantix in the past and pt had a visual hallucination of a box of kittens.   Pt reports having a mild non-productive cough. Pt denies any wheezing.  Pt also wants to start quit smoking.   Patient Active Problem List   Diagnosis Date Noted  . Abdominal pain   . Hypoxia 04/08/2015  . Opioid use with withdrawal ([place])   . Protein-calorie malnutrition ([place])   . Episode of recurrent major depressive disorder ([place])   . Secondary hyperparathyroidism ([place]) 01/10/2015  . Vitamin D deficiency 01/10/2015  . 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 Anemia, iron deficiency 11/03/2014  . Chronic pain syndrome 10/24/2014  . Osteoporosis 10/05/2014  . Vertebral compression fracture ([place]) 10/05/2014  . Coronary artery disease due to calcified coronary lesion 10/05/2014  . Weight loss   . Tobacco abuse   . History of subtotal gastrectomy with Roux-en-Y recontruction   . Orthostatic hypotension 08/11/2014  . Severe protein-calorie malnutrition ([place]) 12/30/2013  . Constipation, chronic 07/10/2013  . Chronic abdominal pain   . COPD (chronic obstructive pulmonary disease) ([place]) 11/07/2010  . Major depressive disorder, recurrent episode, moderate ([place]) 06/06/2010  . 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 Anxiety state 01/07/2007  .  BARRETT'S ESOPHAGUS, HX OF 07/03/2005    Current Outpatient Prescriptions on File Prior to Visit  Medication Sig Dispense Refill  . albuterol (PROVENTIL HFA;VENTOLIN HFA) 108 (90 Base) MCG/ACT inhaler Inhale 2 puffs into the lungs every 4 (four) hours as needed (cough, shortness of breath or wheezing.). 1 Inhaler 3  . ALPRAZolam (XANAX) 1 MG tablet Take 1 tablet (1 mg total) by mouth at bedtime as needed for anxiety. This is a 1 month supply 30 tablet 0  . benzonatate (TESSALON) 200 MG capsule Take 1 capsule (200 mg total) by mouth 3 (three) times daily as needed for cough. 60 capsule 0  . budesonide-formoterol (SYMBICORT) 160-4.5 MCG/ACT inhaler Inhale 2 puffs into the lungs 2 (two) times daily. 1 Inhaler 3  . DULoxetine (CYMBALTA) 60 MG capsule Take 1 capsule (60 mg total) by mouth daily. 30 capsule 1  . feeding supplement (BOOST / RESOURCE BREEZE) LIQD Take 1 Container by mouth 3 (three) times daily between meals. 90 Container 11  . fluticasone (FLONASE) 50 MCG/ACT nasal spray Place 2 sprays into both nostrils daily. 16 g 6  . ipratropium (ATROVENT) 0.03 % nasal spray Place 2 sprays into both nostrils 2 (two) times daily. 30 mL 0  . ipratropium-albuterol (DUONEB) 0.5-2.5 (3) MG/3ML SOLN Take 3 mLs by nebulization 4 (four) times daily -  before meals and at bedtime. 360 mL 3  . omeprazole (PRILOSEC) 40 MG capsule TAKE ONE CAPSULE BY MOUTH TWICE DAILY 180 capsule 3  . ondansetron (ZOFRAN ODT) 4 MG disintegrating tablet Take 1-2 tablets (  4-8 mg total) by mouth every 8 (eight) hours as needed for nausea or vomiting. 180 tablet 0  . UNABLE TO FIND Place 1 application into the nose once. Med Name: best Fit evaluation for oxygen, possibly simply go mini 1 Device 0  . UNABLE TO FIND Med Name: Best fit evaluation for oxygen - possible simply go mini 1 Device 0  . guaiFENesin-codeine 100-10 MG/5ML syrup Take 10 mLs by mouth every 4 (four) hours as needed for cough. (Patient not taking: Reported on  01/10/2016) 500 mL 0  . HYDROcodone-acetaminophen (NORCO) 10-325 MG tablet Take 1-2 tablets by mouth every 6 (six) hours as needed for moderate pain. This is a 1 week supply. Ok to fill Saturday 01/06/16 (Patient not taking: Reported on 01/10/2016) 60 tablet 0  . OLANZapine (ZYPREXA) 10 MG tablet Take 1 tablet (10 mg total) by mouth at bedtime. (Patient not taking: Reported on 01/10/2016) 30 tablet 5   No current facility-administered medications on file prior to visit.    Allergies  Allergen Reactions  . Lithium Nausea And Vomiting  . Celebrex [Celecoxib] Other (See Comments)    History of bleeding ulcers  . Morphine Itching  . Quetiapine Other (See Comments)     tardive dyskinesia  . Varenicline Tartrate Other (See Comments)    hallucinations    Depression screen [facility] 2/9 01/10/2016 12/19/2015 12/18/2015 12/11/2015 12/08/2015  Decreased Interest 0 0 0 0 0  Down, Depressed, Hopeless 0 0 0 0 0  PHQ - 2 Score 0 0 0 0 0  Altered sleeping - - - - 0  Tired, decreased energy - - - - 0  Change in appetite - - - - 0  Feeling bad or failure about yourself  - - - - 0  Trouble concentrating - - - - 0  Moving slowly or fidgety/restless - - - - 0  Suicidal thoughts - - - - 0  PHQ-9 Score - - - - 0        Review of Systems  Constitutional: Negative for fever.  HENT: Negative for congestion.   Eyes: Negative for visual disturbance.  Respiratory: Positive for cough. Negative for wheezing.   Cardiovascular: Negative for chest pain.  Gastrointestinal: Negative for abdominal pain.  Genitourinary: Negative for dysuria.  Musculoskeletal: Negative for back pain.  Skin: Negative for rash.  Neurological: Negative for headaches.       Objective:  BP 118/76 mmHg  Pulse 79  Temp(Src) 98.7 F (37.1 C) (Oral)  Resp 18  Ht 5\' 3"  (1.6 m)  Wt 104 lb (47.174 kg)  BMI 18.43 kg/m2  SpO2 95%  Physical Exam  Constitutional: She is oriented to person, place, and time. She appears well-developed and  well-nourished. No distress.  HENT:  Head: Normocephalic and atraumatic.  Eyes: Conjunctivae are normal. Pupils are equal, round, and reactive to light.  Neck: Neck supple.  Cardiovascular: Normal rate, regular rhythm, S1 normal, S2 normal and normal heart sounds.  Exam reveals no gallop and no friction rub.   No murmur heard. Pulmonary/Chest: Effort normal and breath sounds normal. She has no decreased breath sounds. She has no wheezes. She has no rhonchi. She has no rales.  Musculoskeletal: Normal range of motion.  Neurological: She is alert and oriented to person, place, and time.  Skin: Skin is warm and dry.  Psychiatric: She has a normal mood and affect. Her behavior is normal.  Nursing note and vitals reviewed.         Assessment &  Plan:   1. Tobacco use disorder - wants to retry chantix despite her hallucination of cats under the bed when she tried it years prior. Her roommate [name] is with her today and agrees to help monitor Vicki's mood and will let pt and I know if she is developing adverse pysch sxs on the chantix  2. Chronic pain syndrome - for 1 month now has been doing fills of her hydrocodone wkly - this seems to be working better - pt has not been running out as soon so will continue indefinitely. 1 mo of rxs given to pt today to drop off at [facility]. I spoke with [facility] and informed him of the weekly fills.  3. Adjustment disorder with mixed anxiety and depressed mood    She asked me to change her alprazolam to fill weekly along with her hydrocodone. She has 14 tabs to last her for the next 10d and then she will get 10 tabs every Saturday (next to be filled on July 29th).  This is actually more alprazolam than I had her on previously of #30 for a 1 mo supply.  This was explained to the pt and to the pharmacist. Pt was given 4 different alprazolam rxs on 1 sheet of paper to be filled today 7/19, 7/29, 8/5, and 8/12. She will leave all of these with the pharmacist  and then go to [facility] every Sat for her med fills.  Meds ordered this encounter  Medications  . varenicline (CHANTIX STARTING MONTH PAK) 0.5 MG X 11 & 1 MG X 42 tablet    Sig: Take one 0.5 mg tablet by mouth once daily for 3 days, then increase to one 0.5 mg tablet twice daily for 4 days, then increase to one 1 mg tablet twice daily.    Dispense:  53 tablet    Refill:  0  . HYDROcodone-acetaminophen (NORCO) 10-325 MG tablet    Sig: Take 1-2 tablets by mouth every 6 (six) hours as needed. This is a 1 week supply. Ok to fill Saturday 01/20/16    Dispense:  60 tablet    Refill:  0  . HYDROcodone-acetaminophen (NORCO) 10-325 MG tablet    Sig: Take 1-2 tablets by mouth every 6 (six) hours as needed for moderate pain. This is a 1 week supply. Ok to fill Saturday 01/13/16    Dispense:  60 tablet    Refill:  0  . HYDROcodone-acetaminophen (NORCO) 10-325 MG tablet    Sig: Take 1-2 tablets by mouth every 6 (six) hours as needed. This is a 1 week supply. Ok to fill Saturday 01/27/16    Dispense:  60 tablet    Refill:  0  . HYDROcodone-acetaminophen (NORCO) 10-325 MG tablet    Sig: Take 1-2 tablets by mouth every 6 (six) hours as needed. This is a 1 week supply. Ok to fill Saturday 02/03/16    Dispense:  60 tablet    Refill:  0  . ALPRAZolam (XANAX) 1 MG tablet    Sig: Take 1 tablet (1 mg total) by mouth 2 (two) times daily as needed for anxiety. This is a 10 day supply. May fill today    Dispense:  14 tablet    Refill:  0  . ALPRAZolam (XANAX) 1 MG tablet    Sig: Take 1 tablet (1 mg total) by mouth 2 (two) times daily as needed for anxiety. This is a 1 week supply. May fill on or after 01/20/2016  Dispense:  10 tablet    Refill:  0  . ALPRAZolam (XANAX) 1 MG tablet    Sig: Take 1 tablet (1 mg total) by mouth 2 (two) times daily as needed for anxiety. This is a 1 week supply. May fill on or after 01/27/2016    Dispense:  10 tablet    Refill:  0  . ALPRAZolam (XANAX) 1 MG tablet    Sig:  Take 1 tablet (1 mg total) by mouth 2 (two) times daily as needed for anxiety. This is a 1 week supply. May fill on or after 02/03/2016    Dispense:  10 tablet    Refill:  0    I personally performed the services described in this documentation, which was scribed in my presence. The recorded information has been reviewed and considered, and addended by me as needed.   [name], M.D.  [facility] [address] [place], [place] [address] [phone] phone [phone] fax  01/10/2016 4:57 PM

## 2016-01-11 NOTE — Telephone Encounter (Signed)
Pt advised.

## 2016-01-13 ENCOUNTER — Other Ambulatory Visit: Payer: Self-pay | Admitting: Family Medicine

## 2016-01-13 ENCOUNTER — Ambulatory Visit (INDEPENDENT_AMBULATORY_CARE_PROVIDER_SITE_OTHER): Payer: Medicare Other | Admitting: Family Medicine

## 2016-01-13 VITALS — BP 120/72 | HR 84 | Temp 97.6°F | Resp 18 | Ht 63.0 in | Wt 102.8 lb

## 2016-01-13 DIAGNOSIS — F331 Major depressive disorder, recurrent, moderate: Secondary | ICD-10-CM

## 2016-01-13 DIAGNOSIS — F17213 Nicotine dependence, cigarettes, with withdrawal: Secondary | ICD-10-CM

## 2016-01-13 DIAGNOSIS — R109 Unspecified abdominal pain: Secondary | ICD-10-CM | POA: Diagnosis not present

## 2016-01-13 MED ORDER — NICOTINE 21 MG/24HR TD PT24: 21.0000 mg | MEDICATED_PATCH | Freq: Every day | TRANSDERMAL | Status: DC

## 2016-01-13 NOTE — Patient Instructions (Addendum)
IF you received an x-ray today, you will receive an invoice from [facility]. Please contact [facility] at [phone] with questions or concerns regarding your invoice.   IF you received labwork today, you will receive an invoice from [employer]. Please contact [facility] at [phone] with questions or concerns regarding your invoice.   Our billing staff will not be able to assist you with questions regarding bills from these companies.  You will be contacted with the lab results as soon as they are available. The fastest way to get your results is to activate your My Chart account. Instructions are located on the last page of this paperwork. If you have not heard from [place] regarding the results in 2 weeks, please contact this office.    Tobacco Use Disorder Tobacco use disorder (TUD) is a mental disorder. It is the long-term use of tobacco in spite of related health problems or difficulty with normal life activities. Tobacco is most commonly smoked as cigarettes and less commonly as cigars or pipes. Smokeless chewing tobacco and snuff are also popular. People with TUD get a feeling of extreme pleasure (euphoria) from using tobacco and have a desire to use it again and again. Repeated use of tobacco can cause problems. The addictive effects of tobacco are due mainly tothe ingredient nicotine. Nicotine also causes a rush of adrenaline (epinephrine) in the body. This leads to increased blood pressure, heart rate, and breathing rate. These changes may cause problems for people with high blood pressure, weak hearts, or lung disease. High doses of nicotine in children and pets can lead to seizures and death.  Tobacco contains a number of other unsafe chemicals. These chemicals are especially harmful when inhaled as smoke and can damage almost every organ in the body. Smokers live shorter lives than nonsmokers and are at risk of dying from a number of  diseases and cancers. Tobacco smoke can also cause health problems for nonsmokers (due to inhaling secondhand smoke). Smoking is also a fire hazard.  TUD usually starts in the late teenage years and is most common in young adults between the ages of 4 and 35 years. People who start smoking earlier in life are more likely to continue smoking as adults. TUD is somewhat more common in men than women. People with TUD are at higher risk for using alcohol and other drugs of abuse. RISK FACTORS Risk factors for TUD include:   Having family members with the disorder.  Being around people who use tobacco.  Having an existing mental health issue such as schizophrenia, depression, bipolar disorder, ADHD, or posttraumatic stress disorder (PTSD). SIGNS AND SYMPTOMS  People with tobacco use disorder have two or more of the following signs and symptoms within 12 months:   Use of more tobacco over a longer period than intended.   Not able to cut down or control tobacco use.   A lot of time spent obtaining or using tobacco.   Strong desire or urge to use tobacco (craving). Cravings may last for 6 months or longer after quitting.  Use of tobacco even when use leads to major problems at work, school, or home.   Use of tobacco even when use leads to relationship problems.   Giving up or cutting down on important life activities because of tobacco use.   Repeatedly using tobacco in situations where it puts you or others in physical danger, like smoking in bed.   Use of tobacco even when it  is known that a physical or mental problem is likely related to tobacco use.   Physical problems are numerous and may include chronic bronchitis, emphysema, lung and other cancers, gum disease, high blood pressure, heart disease, and stroke.   Mental problems caused by tobacco may include difficulty sleeping and anxiety.  Need to use greater amounts of tobacco to get the same effect. This means you have  developed a tolerance.   Withdrawal symptoms as a result of stopping or rapidly cutting back use. These symptoms may last a month or more after quitting and include the following:   Depressed, anxious, or irritable mood.   Difficulty concentrating.   Increased appetite.  Restlessness or trouble sleeping.   Use of tobacco to avoid withdrawal symptoms. DIAGNOSIS  Tobacco use disorder is diagnosed by your health care provider. A diagnosis may be made by:  Your health care provider asking questions about your tobacco use and any problems it may be causing.  A physical exam.  Lab tests.  You may be referred to a mental health professional or addiction specialist. The severity of tobacco use disorder depends on the number of signs and symptoms you have:   Mild--Two or three symptoms.  Moderate--Four or five symptoms.   Severe--Six or more symptoms.  TREATMENT  Many people with tobacco use disorder are unable to quit on their own and need help. Treatment options include the following:  Nicotine replacement therapy (NRT). NRT provides nicotine without the other harmful chemicals in tobacco. NRT gradually lowers the dosage of nicotine in the body and reduces withdrawal symptoms. NRT is available in over-the-counter forms (gum, lozenges, and skin patches) as well as prescription forms (mouth inhaler and nasal spray).  Medicines.This may include:  Antidepressant medicine that may reduce nicotine cravings.  A medicine that acts on nicotine receptors in the brain to reduce cravings and withdrawal symptoms. It may also block the effects of tobacco in people with TUD who relapse.  Counseling or talk therapy. A form of talk therapy called behavioral therapy is commonly used to treat people with TUD. Behavioral therapy looks at triggers for tobacco use, how to avoid them, and how to cope with cravings. It is most effective in person or by phone but is also available in self-help  forms (books and Internet websites).  Support groups. These provide emotional support, advice, and guidance for quitting tobacco. The most effective treatment for TUD is usually a combination of medicine, talk therapy, and support groups. HOME CARE INSTRUCTIONS  Keep all follow-up visits as directed by your health care provider. This is important.  Take medicines only as directed by your health care provider.  Check with your health care provider before starting new prescription or over-the-counter medicines. SEEK MEDICAL CARE IF:  You are not able to take your medicines as prescribed.  Treatment is not helping your TUD and your symptoms get worse. SEEK IMMEDIATE MEDICAL CARE IF:  You have serious thoughts about hurting yourself or others.  You have trouble breathing, chest pain, sudden weakness, or sudden numbness in part of your body.   This information is not intended to replace advice given to you by your health care provider. Make sure you discuss any questions you have with your health care provider.   Document Released: 02/14/2004 Document Revised: 07/01/2014 Document Reviewed: 08/06/2013 Elsevier Interactive Patient Education 2016 [employer].  Steps to Quit Smoking  Smoking tobacco can be harmful to your health and can affect almost every organ in your body.  Smoking puts you, and those around you, at risk for developing many serious chronic diseases. Quitting smoking is difficult, but it is one of the best things that you can do for your health. It is never too late to quit. WHAT ARE THE BENEFITS OF QUITTING SMOKING? When you quit smoking, you lower your risk of developing serious diseases and conditions, such as:  Lung cancer or lung disease, such as COPD.  Heart disease.  Stroke.  Heart attack.  Infertility.  Osteoporosis and bone fractures. Additionally, symptoms such as coughing, wheezing, and shortness of breath may get better when you quit. You may also  find that you get sick less often because your body is stronger at fighting off colds and infections. If you are pregnant, quitting smoking can help to reduce your chances of having a baby of low birth weight. HOW DO I GET READY TO QUIT? When you decide to quit smoking, create a plan to make sure that you are successful. Before you quit:  Pick a date to quit. Set a date within the next two weeks to give you time to prepare.  Write down the reasons why you are quitting. Keep this list in places where you will see it often, such as on your bathroom mirror or in your car or wallet.  Identify the people, places, things, and activities that make you want to smoke (triggers) and avoid them. Make sure to take these actions:  Throw away all cigarettes at home, at work, and in your car.  Throw away smoking accessories, such as Scientist, research (medical).  Clean your car and make sure to empty the ashtray.  Clean your home, including curtains and carpets.  Tell your family, friends, and coworkers that you are quitting. Support from your loved ones can make quitting easier.  Talk with your health care provider about your options for quitting smoking.  Find out what treatment options are covered by your health insurance. WHAT STRATEGIES CAN I USE TO QUIT SMOKING?  Talk with your healthcare provider about different strategies to quit smoking. Some strategies include:  Quitting smoking altogether instead of gradually lessening how much you smoke over a period of time. Research shows that quitting "cold [place]" is more successful than gradually quitting.  Attending in-person counseling to help you build problem-solving skills. You are more likely to have success in quitting if you attend several counseling sessions. Even short sessions of 10 minutes can be effective.  Finding resources and support systems that can help you to quit smoking and remain smoke-free after you quit. These resources are most  helpful when you use them often. They can include:  Online chats with a Social worker.  Telephone quitlines.  Printed Furniture conservator/restorer.  Support groups or group counseling.  Text messaging programs.  Mobile phone applications.  Taking medicines to help you quit smoking. (If you are pregnant or breastfeeding, talk with your health care provider first.) Some medicines contain nicotine and some do not. Both types of medicines help with cravings, but the medicines that include nicotine help to relieve withdrawal symptoms. Your health care provider may recommend:  Nicotine patches, gum, or lozenges.  Nicotine inhalers or sprays.  Non-nicotine medicine that is taken by mouth. Talk with your health care provider about combining strategies, such as taking medicines while you are also receiving in-person counseling. Using these two strategies together makes you more likely to succeed in quitting than if you used either strategy on its own. If you are pregnant or  breastfeeding, talk with your health care provider about finding counseling or other support strategies to quit smoking. Do not take medicine to help you quit smoking unless told to do so by your health care provider. WHAT THINGS CAN I DO TO MAKE IT EASIER TO QUIT? Quitting smoking might feel overwhelming at first, but there is a lot that you can do to make it easier. Take these important actions:  Reach out to your family and friends and ask that they support and encourage you during this time. Call telephone quitlines, reach out to support groups, or work with a counselor for support.  Ask people who smoke to avoid smoking around you.  Avoid places that trigger you to smoke, such as bars, parties, or smoke-break areas at work.  Spend time around people who do not smoke.  Lessen stress in your life, because stress can be a smoking trigger for some people. To lessen stress, try:  Exercising regularly.  Deep-breathing  exercises.  Yoga.  Meditating.  Performing a body scan. This involves closing your eyes, scanning your body from head to toe, and noticing which parts of your body are particularly tense. Purposefully relax the muscles in those areas.  Download or purchase mobile phone or tablet apps (applications) that can help you stick to your quit plan by providing reminders, tips, and encouragement. There are many free apps, such as QuitGuide from the [employer] Office manager for Disease Control and Prevention). You can find other support for quitting smoking (smoking cessation) through smokefree.gov and other websites. HOW WILL I FEEL WHEN I QUIT SMOKING? Within the first 24 hours of quitting smoking, you may start to feel some withdrawal symptoms. These symptoms are usually most noticeable 2-3 days after quitting, but they usually do not last beyond 2-3 weeks. Changes or symptoms that you might experience include:  Mood swings.  Restlessness, anxiety, or irritation.  Difficulty concentrating.  Dizziness.  Strong cravings for sugary foods in addition to nicotine.  Mild weight gain.  Constipation.  Nausea.  Coughing or a sore throat.  Changes in how your medicines work in your body.  A depressed mood.  Difficulty sleeping (insomnia). After the first 2-3 weeks of quitting, you may start to notice more positive results, such as:  Improved sense of smell and taste.  Decreased coughing and sore throat.  Slower heart rate.  Lower blood pressure.  Clearer skin.  The ability to breathe more easily.  Fewer sick days. Quitting smoking is very challenging for most people. Do not get discouraged if you are not successful the first time. Some people need to make many attempts to quit before they achieve long-term success. Do your best to stick to your quit plan, and talk with your health care provider if you have any questions or concerns.   This information is not intended to replace advice given to  you by your health care provider. Make sure you discuss any questions you have with your health care provider.   Document Released: 06/04/2001 Document Revised: 10/25/2014 Document Reviewed: 10/25/2014 Elsevier Interactive Patient Education [employer].

## 2016-01-13 NOTE — Progress Notes (Addendum)
Subjective:    Patient ID: [name], female    DOB: 08/03/1950, 65 y.o.   MRN: [id number] By signing my name below, I, [name], attest that this documentation has been prepared under the direction and in the presence of [name], MD. Electronically Signed: [name], ER Scribe. 01/13/2016. 11:18 AM.  Chief Complaint  Patient presents with  . Follow-up  . Depression  . Abdominal Pain    HPI HPI Comments: [name] is a 65 y.o. female who presents to [facility] complaining of crying and emotional disturbance since she has been taking chantex. Her appetite has been reduced. She is having abdominal pain. She is having normal bowel movements. She has been nauseous, but has not vomited.   Past Medical History  Diagnosis Date  . Peptic ulcer disease     dr Oletta Lamas  . Acute duodenal ulcer with hemorrhage and perforation, with obstruction ([place])   . Barrett's esophagus   . Menopause   . GERD (gastroesophageal reflux disease)   . Asthma   . Chronic abdominal pain     narcotic dependence, dr gyarteng-dak at [facility]  . Narcotic abuse   . Anxiety     sees [name] NP at Dr. Radonna Ricker office  . Fx two ribs-open 08-24-11    left 4th and 5th  . Fx of fibula 07-28-11    left fibula, 2 places   . COPD (chronic obstructive pulmonary disease) ([place])   . Allergy   . Anemia   . History of blood transfusion     "related to OR; maybe when I perforated that ulcer"  . Lap Nissen + truncal vagotomy July 2008 05/13/2013  . LEG EDEMA 01/19/2008    Qualifier: Diagnosis of  By: [name] MD, [name]   . ACUTE DUODEN ULCER W/HEMORR PERF&OBSTRUCTION 06/26/2004    Qualifier: Diagnosis of  By: [name] MD, [name]   . FEVER, RECURRENT 08/11/2009    Qualifier: Diagnosis of  By: [name] MD, [name] MENOPAUSE 01/07/2007    Qualifier: Diagnosis of  By: [name], CMA, [place]    . Gastroparesis 06/03/2007    Qualifier: Diagnosis of  By: [name] MD, [name]   . GASTRIC OUTLET OBSTRUCTION  08/28/2007    In July 2008 she underwent laparoscopic enterolysis, Nissen fundoplication over a [id number] bougie, single pledgeted suture colosure of the hiatus and a 3 suture wrap.  Lap truncal vagotomy and a loop gastrojejunostomy was performed.  This was revised in August of 2009 to a roux en Y gastrojejujostomy.     . 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 Depression   . History of hiatal hernia   . Complication of anesthesia     pt states had too much xanax on board - agitated   . Anginal pain ([place])     "many many years ago"  . Exertional shortness of breath   . Pneumonia, organism unspecified 11/07/2010    Assoc with R Parapneumonic effusion 09/2010    - Tapped 10/05/10    - CxR resolved 11/07/2010    . Headache     "monthly" (09/06/2014)  . Arthritis     "hips" (09/06/2014)  . Myocardial infarction ([place])   . S/P jejunostomy Feb 2016 07/26/2014   Allergies  Allergen Reactions  . Lithium Nausea And Vomiting  . Celebrex [Celecoxib] Other (See Comments)    History of bleeding ulcers  . Morphine Itching  . Quetiapine  Other (See Comments)     tardive dyskinesia  . Varenicline Tartrate Other (See Comments)    hallucinations   Current Outpatient Prescriptions on File Prior to Visit  Medication Sig Dispense Refill  . albuterol (PROVENTIL HFA;VENTOLIN HFA) 108 (90 Base) MCG/ACT inhaler Inhale 2 puffs into the lungs every 4 (four) hours as needed (cough, shortness of breath or wheezing.). 1 Inhaler 3  . ALPRAZolam (XANAX) 1 MG tablet Take 1 tablet (1 mg total) by mouth 2 (two) times daily as needed for anxiety. This is a 10 day supply. May fill today 14 tablet 0  . DULoxetine (CYMBALTA) 60 MG capsule Take 1 capsule (60 mg total) by mouth daily. 30 capsule 1  . feeding supplement (BOOST / RESOURCE BREEZE) LIQD Take 1 Container by mouth 3 (three) times daily between meals. 90 Container 11  . HYDROcodone-acetaminophen (NORCO) 10-325 MG tablet Take 1-2 tablets by mouth every 6  (six) hours as needed. This is a 1 week supply. Ok to fill Saturday 01/20/16 60 tablet 0  . ipratropium (ATROVENT) 0.03 % nasal spray Place 2 sprays into both nostrils 2 (two) times daily. 30 mL 0  . ipratropium-albuterol (DUONEB) 0.5-2.5 (3) MG/3ML SOLN Take 3 mLs by nebulization 4 (four) times daily -  before meals and at bedtime. 360 mL 3  . OLANZapine (ZYPREXA) 10 MG tablet Take 1 tablet (10 mg total) by mouth at bedtime. 30 tablet 5  . omeprazole (PRILOSEC) 40 MG capsule TAKE ONE CAPSULE BY MOUTH TWICE DAILY 180 capsule 3  . ondansetron (ZOFRAN ODT) 4 MG disintegrating tablet Take 1-2 tablets (4-8 mg total) by mouth every 8 (eight) hours as needed for nausea or vomiting. 180 tablet 0  . UNABLE TO FIND Place 1 application into the nose once. Med Name: best Fit evaluation for oxygen, possibly simply go mini 1 Device 0  . UNABLE TO FIND Med Name: Best fit evaluation for oxygen - possible simply go mini 1 Device 0  . ALPRAZolam (XANAX) 1 MG tablet Take 1 tablet (1 mg total) by mouth 2 (two) times daily as needed for anxiety. This is a 1 week supply. May fill on or after 01/20/2016 (Patient not taking: Reported on 01/13/2016) 10 tablet 0  . ALPRAZolam (XANAX) 1 MG tablet Take 1 tablet (1 mg total) by mouth 2 (two) times daily as needed for anxiety. This is a 1 week supply. May fill on or after 01/27/2016 (Patient not taking: Reported on 01/13/2016) 10 tablet 0  . ALPRAZolam (XANAX) 1 MG tablet Take 1 tablet (1 mg total) by mouth 2 (two) times daily as needed for anxiety. This is a 1 week supply. May fill on or after 02/03/2016 (Patient not taking: Reported on 01/13/2016) 10 tablet 0  . HYDROcodone-acetaminophen (NORCO) 10-325 MG tablet Take 1-2 tablets by mouth every 6 (six) hours as needed for moderate pain. This is a 1 week supply. Ok to fill Saturday 01/13/16 (Patient not taking: Reported on 01/13/2016) 60 tablet 0  . HYDROcodone-acetaminophen (NORCO) 10-325 MG tablet Take 1-2 tablets by mouth every 6 (six)  hours as needed. This is a 1 week supply. Ok to fill Saturday 01/27/16 (Patient not taking: Reported on 01/13/2016) 60 tablet 0  . HYDROcodone-acetaminophen (NORCO) 10-325 MG tablet Take 1-2 tablets by mouth every 6 (six) hours as needed. This is a 1 week supply. Ok to fill Saturday 02/03/16 (Patient not taking: Reported on 01/13/2016) 60 tablet 0   No current facility-administered medications on file prior to visit.  Review of Systems  Constitutional: Negative for fever, chills and appetite change.  Respiratory: Positive for shortness of breath. Negative for chest tightness and wheezing.   Cardiovascular: Negative for palpitations and leg swelling.  Gastrointestinal: Positive for nausea and abdominal pain. Negative for vomiting, diarrhea, constipation and abdominal distention.  Psychiatric/Behavioral: Positive for behavioral problems, sleep disturbance, dysphoric mood and agitation. Negative for self-injury. The patient is nervous/anxious. The patient is not hyperactive.        Objective:  BP 120/72 mmHg  Pulse 84  Temp(Src) 97.6 F (36.4 C) (Oral)  Resp 18  Ht 5\' 3"  (1.6 m)  Wt 102 lb 12.8 oz (46.63 kg)  BMI 18.21 kg/m2  SpO2 97%  Physical Exam  Constitutional: She is oriented to person, place, and time. She appears well-developed and well-nourished. No distress.  HENT:  Head: Normocephalic and atraumatic.  Eyes: Pupils are equal, round, and reactive to light.  Neck: Neck supple.  Cardiovascular: Normal rate, regular rhythm and normal heart sounds.   No murmur heard. Pulmonary/Chest: Effort normal and breath sounds normal. No respiratory distress.  Musculoskeletal: Normal range of motion.  Neurological: She is alert and oriented to person, place, and time. Coordination normal.  Skin: Skin is warm and dry. She is not diaphoretic.  Psychiatric: She has a normal mood and affect. Her behavior is normal.  Nursing note and vitals reviewed.     Assessment & Plan:   1. Cigarette  nicotine dependence with withdrawal   Stop chantix immed - last dose this a.m. Unfortunately, half-life is 24 hrs so will be several d until mood instability and depression resolves.  Pt has not even cut down on smoking at all while on chantix for a few d. Has not tried nicotine replacement - may need sev patches at once as she is smoking 2-3 ppd.   Reviewed with pt that she is going to have to wean off alprazolam if she wants to continue on hydrocodone. She states she pirmarily uses the alprazolam to help sleep.  Has a 1 mos rx already so will start slow taper next mo.  Meds ordered this encounter  Medications  . nicotine (NICODERM CQ - DOSED IN MG/24 HOURS) 21 mg/24hr patch    Sig: Place 1-2 patches (21-42 mg total) onto the skin daily.    Dispense:  56 patch    Refill:  0    I personally performed the services described in this documentation, which was scribed in my presence. The recorded information has been reviewed and considered, and addended by me as needed.   [name], M.D.  [facility] [address] [place], [place] [address] [phone] phone [phone] fax  01/13/2016 9:42 PM

## 2016-01-15 ENCOUNTER — Telehealth: Payer: Self-pay

## 2016-01-15 NOTE — Telephone Encounter (Signed)
LMVM for [name] to call back if she needs her clindamycin that she left here over the weekend.  If she does not call back we will discard the medication.

## 2016-01-16 DIAGNOSIS — J449 Chronic obstructive pulmonary disease, unspecified: Secondary | ICD-10-CM | POA: Diagnosis not present

## 2016-01-19 ENCOUNTER — Ambulatory Visit (INDEPENDENT_AMBULATORY_CARE_PROVIDER_SITE_OTHER): Payer: Medicare Other | Admitting: Family Medicine

## 2016-01-19 VITALS — BP 116/72 | HR 88 | Temp 99.2°F | Resp 18 | Ht 63.0 in | Wt 104.0 lb

## 2016-01-19 DIAGNOSIS — J449 Chronic obstructive pulmonary disease, unspecified: Secondary | ICD-10-CM | POA: Diagnosis not present

## 2016-01-19 DIAGNOSIS — G4700 Insomnia, unspecified: Secondary | ICD-10-CM | POA: Diagnosis not present

## 2016-01-19 DIAGNOSIS — F411 Generalized anxiety disorder: Secondary | ICD-10-CM

## 2016-01-19 DIAGNOSIS — Z720 Tobacco use: Secondary | ICD-10-CM

## 2016-01-19 DIAGNOSIS — G894 Chronic pain syndrome: Secondary | ICD-10-CM | POA: Diagnosis not present

## 2016-01-19 MED ORDER — TRAZODONE HCL 50 MG PO TABS: 25.0000 mg | ORAL_TABLET | Freq: Every evening | ORAL | 3 refills | Status: DC | PRN

## 2016-01-19 NOTE — Patient Instructions (Addendum)
IF you received an x-ray today, you will receive an invoice from [facility]. Please contact [facility] at [phone] with questions or concerns regarding your invoice.   IF you received labwork today, you will receive an invoice from [employer]. Please contact [facility] at [phone] with questions or concerns regarding your invoice.   Our billing staff will not be able to assist you with questions regarding bills from these companies.  You will be contacted with the lab results as soon as they are available. The fastest way to get your results is to activate your My Chart account. Instructions are located on the last page of this paperwork. If you have not heard from [place] regarding the results in 2 weeks, please contact this office.    This week you can continue smoking 1 pack per day while using 1 nicotine patch daily. On August 4th, decrease to 18 cigarettes per day with 1-2 21g nicotine patches daily. On August 11th, decrease to 16 cigarettes per day. On August 18th decrease to 14 cigarettes per day.  When you decrease amount, always have how many you are allowed for the day in the pack and make sure you do not have access to any others.  That way, you can smoke however many are in the pack throughout the day whenever you want but when the pack is empty you are done for the day.  In general, a 21g nicotine patch should replaced 1 pack of cigarettes so you might need more than 1 patch daily.  Call 1-800-quit-now for support in helping stop - they will keep you motivated and encouraged and it is no cost to you.  If you are not keeping to this regimen, then I will start weaning you off xanax.  As long as you are keeping to our plan and trying to cut down, I will not cut your xanax dose.  Smoking Cessation, Tips for Success If you are ready to quit smoking, congratulations! You have chosen to help yourself be healthier. Cigarettes bring  nicotine, tar, carbon monoxide, and other irritants into your body. Your lungs, heart, and blood vessels will be able to work better without these poisons. There are many different ways to quit smoking. Nicotine gum, nicotine patches, a nicotine inhaler, or nicotine nasal spray can help with physical craving. Hypnosis, support groups, and medicines help break the habit of smoking. WHAT THINGS CAN I DO TO MAKE QUITTING EASIER?  Here are some tips to help you quit for good:  Pick a date when you will quit smoking completely. Tell all of your friends and family about your plan to quit on that date.  Do not try to slowly cut down on the number of cigarettes you are smoking. Pick a quit date and quit smoking completely starting on that day.  Throw away all cigarettes.   Clean and remove all ashtrays from your home, work, and car.  On a card, write down your reasons for quitting. Carry the card with you and read it when you get the urge to smoke.  Cleanse your body of nicotine. Drink enough water and fluids to keep your urine clear or pale yellow. Do this after quitting to flush the nicotine from your body.  Learn to predict your moods. Do not let a bad situation be your excuse to have a cigarette. Some situations in your life might tempt you into wanting a cigarette.  Never have "just one" cigarette. It leads to  wanting another and another. Remind yourself of your decision to quit.  Change habits associated with smoking. If you smoked while driving or when feeling stressed, try other activities to replace smoking. Stand up when drinking your coffee. Brush your teeth after eating. Sit in a different chair when you read the paper. Avoid alcohol while trying to quit, and try to drink fewer caffeinated beverages. Alcohol and caffeine may urge you to smoke.  Avoid foods and drinks that can trigger a desire to smoke, such as sugary or spicy foods and alcohol.  Ask people who smoke not to smoke around  you.  Have something planned to do right after eating or having a cup of coffee. For example, plan to take a walk or exercise.  Try a relaxation exercise to calm you down and decrease your stress. Remember, you may be tense and nervous for the first 2 weeks after you quit, but this will pass.  Find new activities to keep your hands busy. Play with a pen, coin, or rubber band. Doodle or draw things on paper.  Brush your teeth right after eating. This will help cut down on the craving for the taste of tobacco after meals. You can also try mouthwash.   Use oral substitutes in place of cigarettes. Try using lemon drops, carrots, cinnamon sticks, or chewing gum. Keep them handy so they are available when you have the urge to smoke.  When you have the urge to smoke, try deep breathing.  Designate your home as a nonsmoking area.  If you are a heavy smoker, ask your health care provider about a prescription for nicotine chewing gum. It can ease your withdrawal from nicotine.  Reward yourself. Set aside the cigarette money you save and buy yourself something nice.  Look for support from others. Join a support group or smoking cessation program. Ask someone at home or at work to help you with your plan to quit smoking.  Always ask yourself, "Do I need this cigarette or is this just a reflex?" Tell yourself, "Today, I choose not to smoke," or "I do not want to smoke." You are reminding yourself of your decision to quit.  Do not replace cigarette smoking with electronic cigarettes (commonly called e-cigarettes). The safety of e-cigarettes is unknown, and some may contain harmful chemicals.  If you relapse, do not give up! Plan ahead and think about what you will do the next time you get the urge to smoke. HOW WILL I FEEL WHEN I QUIT SMOKING? You may have symptoms of withdrawal because your body is used to nicotine (the addictive substance in cigarettes). You may crave cigarettes, be irritable, feel  very hungry, cough often, get headaches, or have difficulty concentrating. The withdrawal symptoms are only temporary. They are strongest when you first quit but will go away within 10-14 days. When withdrawal symptoms occur, stay in control. Think about your reasons for quitting. Remind yourself that these are signs that your body is healing and getting used to being without cigarettes. Remember that withdrawal symptoms are easier to treat than the major diseases that smoking can cause.  Even after the withdrawal is over, expect periodic urges to smoke. However, these cravings are generally short lived and will go away whether you smoke or not. Do not smoke! WHAT RESOURCES ARE AVAILABLE TO HELP ME QUIT SMOKING? Your health care provider can direct you to community resources or hospitals for support, which may include:  Group support.  Education.  Hypnosis.  Therapy.  This information is not intended to replace advice given to you by your health care provider. Make sure you discuss any questions you have with your health care provider.   Document Released: 03/08/2004 Document Revised: 07/01/2014 Document Reviewed: 11/26/2012 Elsevier Interactive Patient Education [employer].

## 2016-01-19 NOTE — Progress Notes (Signed)
Subjective:  This chart was scribed for [name], by Tamsen Roers, at [facility]  This patient was seen in room 3 and the patient's care was started at 11:18 AM.   Chief Complaint  Patient presents with  . Follow-up    MEDS     Patient ID: [name], female    DOB: 02-14-1951, 65 y.o.   MRN: [id number]  HPI HPI Comments: ENGLISH ACKER is a 65 y.o. female who presents to the [facility] for a follow up regarding her medications.  Patient is no longer taking the Chantix and is no longer having the emotional side effects. She has been using nicotine patches instead but feels like she needs a higher dose and will try using more patches.  She feels scared and wants to stop smoking but feels like her method of quitting is not working for her.  Her roommate [name] has been taking the cigarettes and tearing them into two as well as yelling at her which gives the patient increased stress with the process.  They have also been arguing more recently. Currently she is smoking about a pack per day which is less than the 2-3 packs she used to smoke.   She is also having difficulty sleeping at night due to these issues and notes that she is constantly tossing and turning.   Past Medical History:  Diagnosis Date  . ACUTE DUODEN ULCER W/HEMORR PERF&OBSTRUCTION 06/26/2004   Qualifier: Diagnosis of  By: [name] MD, [name]   . Acute duodenal ulcer with hemorrhage and perforation, with obstruction ([place])   . Allergy   . Anemia   . Anginal pain ([place])    "many many years ago"  . Anxiety    sees [name] NP at Dr. Radonna Ricker office  . Arthritis    "hips" (09/06/2014)  . Asthma   . Barrett's esophagus   . Chronic abdominal pain    narcotic dependence, dr gyarteng-dak at [facility]  . 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 Complication of anesthesia    pt states had too much xanax on board - agitated   . COPD  (chronic obstructive pulmonary disease) ([place])   . Depression   . Exertional shortness of breath   . FEVER, RECURRENT 08/11/2009   Qualifier: Diagnosis of  By: [name] MD, [name]   . Fx of fibula 07-28-11   left fibula, 2 places   . Fx two ribs-open 08-24-11   left 4th and 5th  . GASTRIC OUTLET OBSTRUCTION 08/28/2007   In July 2008 she underwent laparoscopic enterolysis, Nissen fundoplication over a [id number] bougie, single pledgeted suture colosure of the hiatus and a 3 suture wrap.  Lap truncal vagotomy and a loop gastrojejunostomy was performed.  This was revised in August of 2009 to a roux en Y gastrojejujostomy.     . Gastroparesis 06/03/2007   Qualifier: Diagnosis of  By: [name] MD, [name]   . GERD (gastroesophageal reflux disease)   . Headache    "monthly" (09/06/2014)  . History of blood transfusion    "related to OR; maybe when I perforated that ulcer"  . History of hiatal hernia   . Lap Nissen + truncal vagotomy July 2008 05/13/2013  . LEG EDEMA 01/19/2008   Qualifier: Diagnosis of  By: [name] MD, [name]   . Menopause   . MENOPAUSE 01/07/2007   Qualifier: Diagnosis of  By: [name], CMA,  Cindy    . Myocardial infarction ([place])   . Narcotic abuse   . Peptic ulcer disease    dr Oletta Lamas  . Pneumonia, organism unspecified 11/07/2010   Assoc with R Parapneumonic effusion 09/2010    - Tapped 10/05/10    - CxR resolved 11/07/2010    . S/P jejunostomy Feb 2016 07/26/2014    Current Outpatient Prescriptions on File Prior to Visit  Medication Sig Dispense Refill  . albuterol (PROVENTIL HFA;VENTOLIN HFA) 108 (90 Base) MCG/ACT inhaler Inhale 2 puffs into the lungs every 4 (four) hours as needed (cough, shortness of breath or wheezing.). 1 Inhaler 3  . ALPRAZolam (XANAX) 1 MG tablet Take 1 tablet (1 mg total) by mouth 2 (two) times daily as needed for anxiety. This is a 10 day supply. May fill today 14 tablet 0  . ALPRAZolam (XANAX) 1 MG tablet Take 1 tablet (1 mg total) by mouth 2 (two) times daily as  needed for anxiety. This is a 1 week supply. May fill on or after 01/20/2016 10 tablet 0  . ALPRAZolam (XANAX) 1 MG tablet Take 1 tablet (1 mg total) by mouth 2 (two) times daily as needed for anxiety. This is a 1 week supply. May fill on or after 01/27/2016 10 tablet 0  . ALPRAZolam (XANAX) 1 MG tablet Take 1 tablet (1 mg total) by mouth 2 (two) times daily as needed for anxiety. This is a 1 week supply. May fill on or after 02/03/2016 10 tablet 0  . DULoxetine (CYMBALTA) 60 MG capsule Take 1 capsule (60 mg total) by mouth daily. 30 capsule 1  . feeding supplement (BOOST / RESOURCE BREEZE) LIQD Take 1 Container by mouth 3 (three) times daily between meals. 90 Container 11  . HYDROcodone-acetaminophen (NORCO) 10-325 MG tablet Take 1-2 tablets by mouth every 6 (six) hours as needed. This is a 1 week supply. Ok to fill Saturday 01/20/16 60 tablet 0  . HYDROcodone-acetaminophen (NORCO) 10-325 MG tablet Take 1-2 tablets by mouth every 6 (six) hours as needed for moderate pain. This is a 1 week supply. Ok to fill Saturday 01/13/16 60 tablet 0  . HYDROcodone-acetaminophen (NORCO) 10-325 MG tablet Take 1-2 tablets by mouth every 6 (six) hours as needed. This is a 1 week supply. Ok to fill Saturday 01/27/16 60 tablet 0  . HYDROcodone-acetaminophen (NORCO) 10-325 MG tablet Take 1-2 tablets by mouth every 6 (six) hours as needed. This is a 1 week supply. Ok to fill Saturday 02/03/16 60 tablet 0  . ipratropium (ATROVENT) 0.03 % nasal spray Place 2 sprays into both nostrils 2 (two) times daily. 30 mL 0  . ipratropium-albuterol (DUONEB) 0.5-2.5 (3) MG/3ML SOLN Take 3 mLs by nebulization 4 (four) times daily -  before meals and at bedtime. 360 mL 3  . nicotine (NICODERM CQ - DOSED IN MG/24 HOURS) 21 mg/24hr patch Place 1-2 patches (21-42 mg total) onto the skin daily. 56 patch 0  . OLANZapine (ZYPREXA) 10 MG tablet Take 1 tablet (10 mg total) by mouth at bedtime. 30 tablet 5  . omeprazole (PRILOSEC) 40 MG capsule TAKE  ONE CAPSULE BY MOUTH TWICE DAILY 180 capsule 3  . ondansetron (ZOFRAN ODT) 4 MG disintegrating tablet Take 1-2 tablets (4-8 mg total) by mouth every 8 (eight) hours as needed for nausea or vomiting. 180 tablet 0  . UNABLE TO FIND Place 1 application into the nose once. Med Name: best Fit evaluation for oxygen, possibly simply go mini 1 Device 0  .  UNABLE TO FIND Med Name: Best fit evaluation for oxygen - possible simply go mini 1 Device 0   No current facility-administered medications on file prior to visit.     Allergies  Allergen Reactions  . Lithium Nausea And Vomiting  . Celebrex [Celecoxib] Other (See Comments)    History of bleeding ulcers  . Morphine Itching  . Quetiapine Other (See Comments)     tardive dyskinesia  . Varenicline Tartrate Other (See Comments)    hallucinations      Review of Systems  Constitutional: Negative for chills and fever.  Eyes: Negative for pain, redness and itching.  Gastrointestinal: Negative for nausea and vomiting.  Neurological: Negative for seizures, syncope and speech difficulty.  Psychiatric/Behavioral: Positive for sleep disturbance.       Objective:   Physical Exam  Constitutional: She is oriented to person, place, and time. No distress.  HENT:  Head: Normocephalic and atraumatic.  Eyes: Pupils are equal, round, and reactive to light.  Cardiovascular: Normal rate and regular rhythm.   Pulmonary/Chest: Effort normal. No respiratory distress.  Neurological: She is alert and oriented to person, place, and time.  Skin: Skin is warm and dry.  Psychiatric: Her behavior is normal.     Vitals:   01/19/16 1047  BP: 116/72  Pulse: 88  Resp: 18  Temp: 99.2 F (37.3 C)  TempSrc: Oral  SpO2: 97%  Weight: 104 lb (47.2 kg)  Height: 5\' 3"  (1.6 m)        Assessment & Plan:   1. Insomnia - has been using higher dose of alprazolam but now we are trying to wean pt off of alprazolam/bzd due to recent unintentional OD. I don't think  pt is using her zyprexa which would help so encouraged to try. She has not tried trazodone but would like to.  2. Chronic obstructive pulmonary disease, unspecified COPD type ([place])   3. Tobacco abuse - after just a few days, chantix caused severe depression - pt was in office wheeping uncontrollably without cause - so she stopped that last wk - has cut down from 2-3 ppd to 1-2 ppd using a 21g nicotine patch. Knows she can't quit cold [place] so advised to remove 2 cigarettes from her pack and each wk increase the amount she removes by 2.  May need to try 2 nicotine patches  4. Chronic pain syndrome - pt has 8.4 tabs of hydrocodone 10mg  a day - she gets #60/wk - has rxs at [facility]. Even with only getting a 1 wk supply at a time, she still is loosing medication so never write for longer supply. Encouraged pt to get med box but she is resistant.   She has rxs on file at her [facility] to fill every Sat to last till 8/19 at which time I will refill.  5. Anxiety state - ok to use 1/2 tab alprazolam tid for now - will need to wean down since on chronic narcotics but this is already sig lower dose than she was using sev mos ago so will leave at current dose as long as pt is actively working on cutting down on smoking.  Pt gets #10 alprazolam 1mg  a week, can fill every Saturday. She has rxs at [facility] to last till 8/19 at which time only I will refill.     Meds ordered this encounter  Medications  . traZODone (DESYREL) 50 MG tablet    Sig: Take 0.5-1 tablets (25-50 mg total) by mouth  at bedtime as needed for sleep.    Dispense:  30 tablet    Refill:  3    I personally performed the services described in this documentation, which was scribed in my presence. The recorded information has been reviewed and considered, and addended by me as needed.   [name], M.D.  [facility] [address] [place], [place] [address] [phone] phone [phone] fax  01/19/16  5:14 PM

## 2016-01-24 ENCOUNTER — Emergency Department (HOSPITAL_COMMUNITY)
Admission: EM | Admit: 2016-01-24 | Discharge: 2016-01-25 | Disposition: A | Discharge status: Home / Self Care | Payer: Medicare Other | Attending: Emergency Medicine | Admitting: Emergency Medicine

## 2016-01-24 ENCOUNTER — Ambulatory Visit (INDEPENDENT_AMBULATORY_CARE_PROVIDER_SITE_OTHER): Payer: Medicare Other | Admitting: Family Medicine

## 2016-01-24 ENCOUNTER — Encounter (HOSPITAL_COMMUNITY): Payer: Self-pay | Admitting: Emergency Medicine

## 2016-01-24 VITALS — BP 110/70 | HR 91 | Temp 97.6°F | Resp 16 | Wt 108.2 lb

## 2016-01-24 DIAGNOSIS — J45909 Unspecified asthma, uncomplicated: Secondary | ICD-10-CM | POA: Insufficient documentation

## 2016-01-24 DIAGNOSIS — G894 Chronic pain syndrome: Secondary | ICD-10-CM

## 2016-01-24 DIAGNOSIS — J449 Chronic obstructive pulmonary disease, unspecified: Secondary | ICD-10-CM | POA: Diagnosis not present

## 2016-01-24 DIAGNOSIS — R0981 Nasal congestion: Secondary | ICD-10-CM | POA: Diagnosis present

## 2016-01-24 DIAGNOSIS — Z720 Tobacco use: Secondary | ICD-10-CM

## 2016-01-24 DIAGNOSIS — F331 Major depressive disorder, recurrent, moderate: Secondary | ICD-10-CM

## 2016-01-24 DIAGNOSIS — R1084 Generalized abdominal pain: Secondary | ICD-10-CM | POA: Diagnosis not present

## 2016-01-24 DIAGNOSIS — G4700 Insomnia, unspecified: Secondary | ICD-10-CM

## 2016-01-24 DIAGNOSIS — R5383 Other fatigue: Secondary | ICD-10-CM | POA: Diagnosis not present

## 2016-01-24 DIAGNOSIS — R5381 Other malaise: Secondary | ICD-10-CM | POA: Diagnosis not present

## 2016-01-24 DIAGNOSIS — F17210 Nicotine dependence, cigarettes, uncomplicated: Secondary | ICD-10-CM | POA: Insufficient documentation

## 2016-01-24 NOTE — Patient Instructions (Signed)
You CANNOT take chantix - you will end up in the psychiatric hospital - I don't think it is safe.  Start 1-2 nicotine patches every day. You will not be able to quit cold [place]. Start gum or a lollipop (dum-dum sucker) to use. Take cigarettes out of your pack every day and throw them away so you are only smoking 1 pack a day but only 18 cigarettes are in the pack.   Recheck with me in 2 weeks to cut down your cigarettes and refill the hydrocodone. Use the trazodone for sleep as we start to wean you off the xanax.  Smoking Cessation, Tips for Success If you are ready to quit smoking, congratulations! You have chosen to help yourself be healthier. Cigarettes bring nicotine, tar, carbon monoxide, and other irritants into your body. Your lungs, heart, and blood vessels will be able to work better without these poisons. There are many different ways to quit smoking. Nicotine gum, nicotine patches, a nicotine inhaler, or nicotine nasal spray can help with physical craving. Hypnosis, support groups, and medicines help break the habit of smoking. WHAT THINGS CAN I DO TO MAKE QUITTING EASIER?  Here are some tips to help you quit for good:  Pick a date when you will quit smoking completely. Tell all of your friends and family about your plan to quit on that date.  Do not try to slowly cut down on the number of cigarettes you are smoking. Pick a quit date and quit smoking completely starting on that day.  Throw away all cigarettes.   Clean and remove all ashtrays from your home, work, and car.  On a card, write down your reasons for quitting. Carry the card with you and read it when you get the urge to smoke.  Cleanse your body of nicotine. Drink enough water and fluids to keep your urine clear or pale yellow. Do this after quitting to flush the nicotine from your body.  Learn to predict your moods. Do not let a bad situation be your excuse to have a cigarette. Some situations in your life might  tempt you into wanting a cigarette.  Never have "just one" cigarette. It leads to wanting another and another. Remind yourself of your decision to quit.  Change habits associated with smoking. If you smoked while driving or when feeling stressed, try other activities to replace smoking. Stand up when drinking your coffee. Brush your teeth after eating. Sit in a different chair when you read the paper. Avoid alcohol while trying to quit, and try to drink fewer caffeinated beverages. Alcohol and caffeine may urge you to smoke.  Avoid foods and drinks that can trigger a desire to smoke, such as sugary or spicy foods and alcohol.  Ask people who smoke not to smoke around you.  Have something planned to do right after eating or having a cup of coffee. For example, plan to take a walk or exercise.  Try a relaxation exercise to calm you down and decrease your stress. Remember, you may be tense and nervous for the first 2 weeks after you quit, but this will pass.  Find new activities to keep your hands busy. Play with a pen, coin, or rubber band. Doodle or draw things on paper.  Brush your teeth right after eating. This will help cut down on the craving for the taste of tobacco after meals. You can also try mouthwash.   Use oral substitutes in place of cigarettes. Try using lemon drops, carrots,  cinnamon sticks, or chewing gum. Keep them handy so they are available when you have the urge to smoke.  When you have the urge to smoke, try deep breathing.  Designate your home as a nonsmoking area.  If you are a heavy smoker, ask your health care provider about a prescription for nicotine chewing gum. It can ease your withdrawal from nicotine.  Reward yourself. Set aside the cigarette money you save and buy yourself something nice.  Look for support from others. Join a support group or smoking cessation program. Ask someone at home or at work to help you with your plan to quit smoking.  Always ask  yourself, "Do I need this cigarette or is this just a reflex?" Tell yourself, "Today, I choose not to smoke," or "I do not want to smoke." You are reminding yourself of your decision to quit.  Do not replace cigarette smoking with electronic cigarettes (commonly called e-cigarettes). The safety of e-cigarettes is unknown, and some may contain harmful chemicals.  If you relapse, do not give up! Plan ahead and think about what you will do the next time you get the urge to smoke. HOW WILL I FEEL WHEN I QUIT SMOKING? You may have symptoms of withdrawal because your body is used to nicotine (the addictive substance in cigarettes). You may crave cigarettes, be irritable, feel very hungry, cough often, get headaches, or have difficulty concentrating. The withdrawal symptoms are only temporary. They are strongest when you first quit but will go away within 10-14 days. When withdrawal symptoms occur, stay in control. Think about your reasons for quitting. Remind yourself that these are signs that your body is healing and getting used to being without cigarettes. Remember that withdrawal symptoms are easier to treat than the major diseases that smoking can cause.  Even after the withdrawal is over, expect periodic urges to smoke. However, these cravings are generally short lived and will go away whether you smoke or not. Do not smoke! WHAT RESOURCES ARE AVAILABLE TO HELP ME QUIT SMOKING? Your health care provider can direct you to community resources or hospitals for support, which may include:  Group support.  Education.  Hypnosis.  Therapy.   This information is not intended to replace advice given to you by your health care provider. Make sure you discuss any questions you have with your health care provider.   Document Released: 03/08/2004 Document Revised: 07/01/2014 Document Reviewed: 11/26/2012 Elsevier Interactive Patient Education 2016 [employer].  Steps to Quit Smoking  Smoking tobacco  can be harmful to your health and can affect almost every organ in your body. Smoking puts you, and those around you, at risk for developing many serious chronic diseases. Quitting smoking is difficult, but it is one of the best things that you can do for your health. It is never too late to quit. WHAT ARE THE BENEFITS OF QUITTING SMOKING? When you quit smoking, you lower your risk of developing serious diseases and conditions, such as:  Lung cancer or lung disease, such as COPD.  Heart disease.  Stroke.  Heart attack.  Infertility.  Osteoporosis and bone fractures. Additionally, symptoms such as coughing, wheezing, and shortness of breath may get better when you quit. You may also find that you get sick less often because your body is stronger at fighting off colds and infections. If you are pregnant, quitting smoking can help to reduce your chances of having a baby of low birth weight. HOW DO I GET READY TO QUIT?  When you decide to quit smoking, create a plan to make sure that you are successful. Before you quit:  Pick a date to quit. Set a date within the next two weeks to give you time to prepare.  Write down the reasons why you are quitting. Keep this list in places where you will see it often, such as on your bathroom mirror or in your car or wallet.  Identify the people, places, things, and activities that make you want to smoke (triggers) and avoid them. Make sure to take these actions:  Throw away all cigarettes at home, at work, and in your car.  Throw away smoking accessories, such as Scientist, research (medical).  Clean your car and make sure to empty the ashtray.  Clean your home, including curtains and carpets.  Tell your family, friends, and coworkers that you are quitting. Support from your loved ones can make quitting easier.  Talk with your health care provider about your options for quitting smoking.  Find out what treatment options are covered by your health  insurance. WHAT STRATEGIES CAN I USE TO QUIT SMOKING?  Talk with your healthcare provider about different strategies to quit smoking. Some strategies include:  Quitting smoking altogether instead of gradually lessening how much you smoke over a period of time. Research shows that quitting "cold [place]" is more successful than gradually quitting.  Attending in-person counseling to help you build problem-solving skills. You are more likely to have success in quitting if you attend several counseling sessions. Even short sessions of 10 minutes can be effective.  Finding resources and support systems that can help you to quit smoking and remain smoke-free after you quit. These resources are most helpful when you use them often. They can include:  Online chats with a Social worker.  Telephone quitlines.  Printed Furniture conservator/restorer.  Support groups or group counseling.  Text messaging programs.  Mobile phone applications.  Taking medicines to help you quit smoking. (If you are pregnant or breastfeeding, talk with your health care provider first.) Some medicines contain nicotine and some do not. Both types of medicines help with cravings, but the medicines that include nicotine help to relieve withdrawal symptoms. Your health care provider may recommend:  Nicotine patches, gum, or lozenges.  Nicotine inhalers or sprays.  Non-nicotine medicine that is taken by mouth. Talk with your health care provider about combining strategies, such as taking medicines while you are also receiving in-person counseling. Using these two strategies together makes you more likely to succeed in quitting than if you used either strategy on its own. If you are pregnant or breastfeeding, talk with your health care provider about finding counseling or other support strategies to quit smoking. Do not take medicine to help you quit smoking unless told to do so by your health care provider. WHAT THINGS CAN I DO TO MAKE IT  EASIER TO QUIT? Quitting smoking might feel overwhelming at first, but there is a lot that you can do to make it easier. Take these important actions:  Reach out to your family and friends and ask that they support and encourage you during this time. Call telephone quitlines, reach out to support groups, or work with a counselor for support.  Ask people who smoke to avoid smoking around you.  Avoid places that trigger you to smoke, such as bars, parties, or smoke-break areas at work.  Spend time around people who do not smoke.  Lessen stress in your life, because stress can be a  smoking trigger for some people. To lessen stress, try:  Exercising regularly.  Deep-breathing exercises.  Yoga.  Meditating.  Performing a body scan. This involves closing your eyes, scanning your body from head to toe, and noticing which parts of your body are particularly tense. Purposefully relax the muscles in those areas.  Download or purchase mobile phone or tablet apps (applications) that can help you stick to your quit plan by providing reminders, tips, and encouragement. There are many free apps, such as QuitGuide from the [employer] Office manager for Disease Control and Prevention). You can find other support for quitting smoking (smoking cessation) through smokefree.gov and other websites. HOW WILL I FEEL WHEN I QUIT SMOKING? Within the first 24 hours of quitting smoking, you may start to feel some withdrawal symptoms. These symptoms are usually most noticeable 2-3 days after quitting, but they usually do not last beyond 2-3 weeks. Changes or symptoms that you might experience include:  Mood swings.  Restlessness, anxiety, or irritation.  Difficulty concentrating.  Dizziness.  Strong cravings for sugary foods in addition to nicotine.  Mild weight gain.  Constipation.  Nausea.  Coughing or a sore throat.  Changes in how your medicines work in your body.  A depressed mood.  Difficulty sleeping  (insomnia). After the first 2-3 weeks of quitting, you may start to notice more positive results, such as:  Improved sense of smell and taste.  Decreased coughing and sore throat.  Slower heart rate.  Lower blood pressure.  Clearer skin.  The ability to breathe more easily.  Fewer sick days. Quitting smoking is very challenging for most people. Do not get discouraged if you are not successful the first time. Some people need to make many attempts to quit before they achieve long-term success. Do your best to stick to your quit plan, and talk with your health care provider if you have any questions or concerns.   This information is not intended to replace advice given to you by your health care provider. Make sure you discuss any questions you have with your health care provider.   Document Released: 06/04/2001 Document Revised: 10/25/2014 Document Reviewed: 10/25/2014 Elsevier Interactive Patient Education [employer].

## 2016-01-24 NOTE — ED Triage Notes (Addendum)
Patient states that she feels feverish, almost 100 at home, 98.9 here today, slight headache, achy, and bad. Patient states she wears oxygen, but can not state how much she is supposed to be using. Patient not wearing O2 on arrival to the ER, SPO2 94%. Patient is A&Ox4, NAD noted. Patient states she was at her PCP today for a weekly check up, states that she told her Dr then that she wasn't feeling well, but also states that it's no different than how she usually feels. Patient states "it feels like its just going on and on and won't let up".   Patient states nothing specific brought her to the ER tonight. Patient states she was forced to come her eby her roommate who is at her side currently.

## 2016-01-25 NOTE — ED Provider Notes (Signed)
[facility] DEPT Provider Note   CSN: [id number] Arrival date & time: 01/24/16  2314  First Provider Contact:  First MD Initiated Contact with Patient 01/25/16 0023        History   Chief Complaint Chief Complaint  Patient presents with  . feeling bad    HPI [name] is a 65 y.o. female.  HPI Patient reports she just been "feeling bad". She reports as been going on for quite a while. Weeks or more. She states that off and on she always feels this way. She feels kind of like she might have a flu or a virus. Patient denies any specific associated symptoms. She has taken her temperature and a MAXIMUM TEMPERATURE at home was 100.2. No URI symptoms with nasal congestion sore throat or earache. No chest pain cough or shortness of breath. Patient has emphysema and she reports she chronically has shortness of breath things have been no different than baseline for her. She hasn't been wheezing. No abdominal pain. No vomiting diarrhea or constipation. No pain burning or urgency with Urination. She reports she gets occasionally gets some swelling of her ankles that improves with elevation. No skin rashes. Patient has been trying to quit smoking and gone through several trials with patches and Chantix. These of given some adverse effects. Past Medical History:  Diagnosis Date  . ACUTE DUODEN ULCER W/HEMORR PERF&OBSTRUCTION 06/26/2004   Qualifier: Diagnosis of  By: [name] MD, [name]   . Acute duodenal ulcer with hemorrhage and perforation, with obstruction ([place])   . Allergy   . Anemia   . Anginal pain ([place])    "many many years ago"  . Anxiety    sees [name] NP at Dr. Radonna Ricker office  . Arthritis    "hips" (09/06/2014)  . Asthma   . Barrett's esophagus   . Chronic abdominal pain    narcotic dependence, dr gyarteng-dak at [facility]  . 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 Complication of anesthesia    pt states had too much xanax on board -  agitated   . COPD (chronic obstructive pulmonary disease) ([place])   . Depression   . Exertional shortness of breath   . FEVER, RECURRENT 08/11/2009   Qualifier: Diagnosis of  By: [name] MD, [name]   . Fx of fibula 07-28-11   left fibula, 2 places   . Fx two ribs-open 08-24-11   left 4th and 5th  . GASTRIC OUTLET OBSTRUCTION 08/28/2007   In July 2008 she underwent laparoscopic enterolysis, Nissen fundoplication over a [id number] bougie, single pledgeted suture colosure of the hiatus and a 3 suture wrap.  Lap truncal vagotomy and a loop gastrojejunostomy was performed.  This was revised in August of 2009 to a roux en Y gastrojejujostomy.     . Gastroparesis 06/03/2007   Qualifier: Diagnosis of  By: [name] MD, [name]   . GERD (gastroesophageal reflux disease)   . Headache    "monthly" (09/06/2014)  . History of blood transfusion    "related to OR; maybe when I perforated that ulcer"  . History of hiatal hernia   . Lap Nissen + truncal vagotomy July 2008 05/13/2013  . LEG EDEMA 01/19/2008   Qualifier: Diagnosis of  By: [name] MD, [name]   . Menopause   . MENOPAUSE 01/07/2007   Qualifier: Diagnosis of  By: [name], CMA, [place]    . Myocardial infarction ([place])   . Narcotic abuse   . Peptic  ulcer disease    dr Oletta Lamas  . Pneumonia, organism unspecified 11/07/2010   Assoc with R Parapneumonic effusion 09/2010    - Tapped 10/05/10    - CxR resolved 11/07/2010    . S/P jejunostomy Feb 2016 07/26/2014    Patient Active Problem List   Diagnosis Date Noted  . Abdominal pain   . Hypoxia 04/08/2015  . Opioid use with withdrawal ([place])   . Protein-calorie malnutrition ([place])   . Episode of recurrent major depressive disorder ([place])   . Secondary hyperparathyroidism ([place]) 01/10/2015  . Vitamin D deficiency 01/10/2015  . 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 Anemia, iron deficiency 11/03/2014  . Chronic pain syndrome 10/24/2014  . Osteoporosis 10/05/2014  . Vertebral compression fracture ([place])  10/05/2014  . Coronary artery disease due to calcified coronary lesion 10/05/2014  . Weight loss   . Tobacco abuse   . History of subtotal gastrectomy with Roux-en-Y recontruction   . Orthostatic hypotension 08/11/2014  . Severe protein-calorie malnutrition ([place]) 12/30/2013  . Constipation, chronic 07/10/2013  . Chronic abdominal pain   . COPD (chronic obstructive pulmonary disease) ([place]) 11/07/2010  . Major depressive disorder, recurrent episode, moderate ([place]) 06/06/2010  . 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 Anxiety state 01/07/2007  . BARRETT'S ESOPHAGUS, HX OF 07/03/2005    Past Surgical History:  Procedure Laterality Date  . BIOPSY THYROID  08-13-11   benign nodule, per Dr. Melida Quitter   . CATARACT EXTRACTION W/ INTRAOCULAR LENS  IMPLANT, BILATERAL Bilateral   . COLONOSCOPY    . DILATION AND CURETTAGE OF UTERUS    . ESOPHAGOGASTRODUODENOSCOPY  12-29-09   dr qadeer at [facility], several gastric ulcers  . ESOPHAGOGASTRODUODENOSCOPY N/A 09/25/2012   Procedure: ESOPHAGOGASTRODUODENOSCOPY (EGD);  Surgeon: Beryle Beams, MD;  Location: [facility];  Service: Endoscopy;  Laterality: N/A;  . FRACTURE SURGERY    . GASTROJEJUNOSTOMY  02-20-08   Roux en Y gastrojejunsotomy w 1 foot Roux limb  . GASTROSTOMY N/A 07/26/2014   Procedure: LAPRASCOPIC ASSISTED OPEN PLACEMENT OF JEJUNOSTOMY TUBE;  Surgeon: Pedro Earls, MD;  Location: [facility];  Service: General;  Laterality: N/A;  . HERNIA REPAIR     "hiatal"  . JOINT REPLACEMENT    . LAPAROSCOPIC NISSEN FUNDOPLICATION    . LAPAROSCOPIC PARTIAL GASTRECTOMY N/A 06/29/2013   Procedure: Gastrectomy and GJ Tube placement;  Surgeon: Pedro Earls, MD;  Location: [facility];  Service: General;  Laterality: N/A;  . ORIF HIP FRACTURE Bilateral    "pins in both of them"  . REPAIR OF PERFORATED ULCER    . TONSILLECTOMY      OB History    No data available       Home Medications    Prior to Admission medications   Medication Sig Start Date End  Date Taking? Authorizing Provider  albuterol (PROVENTIL HFA;VENTOLIN HFA) 108 (90 Base) MCG/ACT inhaler Inhale 2 puffs into the lungs every 4 (four) hours as needed (cough, shortness of breath or wheezing.). 08/01/15   Shawnee Knapp, MD  ALPRAZolam [name]) 1 MG tablet Take 1 tablet (1 mg total) by mouth 2 (two) times daily as needed for anxiety. This is a 10 day supply. May fill today 01/10/16   Shawnee Knapp, MD  ALPRAZolam [name]) 1 MG tablet Take 1 tablet (1 mg total) by mouth 2 (two) times daily as needed for anxiety. This is a 1 week supply. May fill on or after 01/20/2016 01/10/16   Shawnee Knapp, MD  ALPRAZolam (  XANAX) 1 MG tablet Take 1 tablet (1 mg total) by mouth 2 (two) times daily as needed for anxiety. This is a 1 week supply. May fill on or after 01/27/2016 01/10/16   Shawnee Knapp, MD  ALPRAZolam [name]) 1 MG tablet Take 1 tablet (1 mg total) by mouth 2 (two) times daily as needed for anxiety. This is a 1 week supply. May fill on or after 02/03/2016 01/10/16   Shawnee Knapp, MD  DULoxetine (CYMBALTA) 60 MG capsule Take 1 capsule (60 mg total) by mouth daily. 10/28/15   Shawnee Knapp, MD  feeding supplement (BOOST / RESOURCE BREEZE) LIQD Take 1 Container by mouth 3 (three) times daily between meals. 12/16/15   Shawnee Knapp, MD  HYDROcodone-acetaminophen (NORCO) 10-325 MG tablet Take 1-2 tablets by mouth every 6 (six) hours as needed. This is a 1 week supply. Ok to fill Saturday 01/20/16 01/10/16   Shawnee Knapp, MD  HYDROcodone-acetaminophen [facility]) 10-325 MG tablet Take 1-2 tablets by mouth every 6 (six) hours as needed for moderate pain. This is a 1 week supply. Ok to fill Saturday 01/13/16 01/10/16   Shawnee Knapp, MD  HYDROcodone-acetaminophen [facility]) 10-325 MG tablet Take 1-2 tablets by mouth every 6 (six) hours as needed. This is a 1 week supply. Ok to fill Saturday 01/27/16 01/10/16   Shawnee Knapp, MD  HYDROcodone-acetaminophen [facility]) 10-325 MG tablet Take 1-2 tablets by mouth every 6 (six) hours as needed. This is a 1 week  supply. Ok to fill Saturday 02/03/16 01/10/16   Shawnee Knapp, MD  ipratropium (ATROVENT) 0.03 % nasal spray Place 2 sprays into both nostrils 2 (two) times daily. 10/20/15   Posey Boyer, MD  ipratropium-albuterol (DUONEB) 0.5-2.5 (3) MG/3ML SOLN Take 3 mLs by nebulization 4 (four) times daily -  before meals and at bedtime. 06/03/15   Robyn Haber, MD  nicotine (NICODERM CQ - DOSED IN MG/24 HOURS) 21 mg/24hr patch Place 1-2 patches (21-42 mg total) onto the skin daily. 01/13/16   Shawnee Knapp, MD  OLANZapine (ZYPREXA) 10 MG tablet Take 1 tablet (10 mg total) by mouth at bedtime. 10/28/15   Shawnee Knapp, MD  omeprazole (PRILOSEC) 40 MG capsule TAKE ONE CAPSULE BY MOUTH TWICE DAILY 10/10/15   Shawnee Knapp, MD  ondansetron (ZOFRAN ODT) 4 MG disintegrating tablet Take 1-2 tablets (4-8 mg total) by mouth every 8 (eight) hours as needed for nausea or vomiting. 12/11/15   Shawnee Knapp, MD  traZODone (DESYREL) 50 MG tablet Take 0.5-1 tablets (25-50 mg total) by mouth at bedtime as needed for sleep. 01/19/16   Shawnee Knapp, MD  UNABLE TO FIND Place 1 application into the nose once. Med Name: best Fit evaluation for oxygen, possibly simply go mini 08/18/15   Mancel Bale, PA-C  UNABLE TO FIND Med Name: Best fit evaluation for oxygen - possible simply go mini 08/18/15   Mancel Bale, PA-C    Family History Family History  Problem Relation Age of Onset  . Cancer Mother     kidney, magliant tumor on face  . Stroke Mother   . Celiac disease Father   . Cancer Father     kidney and pancreatic    Social History Social History  Substance Use Topics  . Smoking status: Current Every Day Smoker    Packs/day: 2.00    Years: 50.00    Types: Cigarettes  . Smokeless tobacco: Never Used  . Alcohol  use No     Allergies   Lithium; Celebrex [celecoxib]; Morphine; Quetiapine; and Varenicline tartrate   Review of Systems Review of Systems 10 Systems reviewed and are negative for acute change except as noted in the  HPI.   Physical Exam Updated Vital Signs BP 147/73 (BP Location: Left Arm)   Pulse 84   Temp 98.9 F (37.2 C) (Oral)   SpO2 94%   Physical Exam  Constitutional: She is oriented to person, place, and time. She appears well-developed and well-nourished.  Patient has no distress. She is alert nontoxic pleasant and interactive.  HENT:  Head: Normocephalic and atraumatic.  Bilateral TMs are normal. Posterior or first widely patent.  Eyes: EOM are normal. Pupils are equal, round, and reactive to light.  Neck: Neck supple.  Cardiovascular: Normal rate, regular rhythm, normal heart sounds and intact distal pulses.   Pulmonary/Chest: Effort normal and breath sounds normal.  Patient's breath sounds are slightly soft consistent with COPD but no active wheezing or rales. No respiratory distress no cough with inspiration.  Abdominal: Soft. She exhibits no distension. There is no tenderness.  Patient has multiple abdominal scars that are well-healed. She does not have distention or pain to palpation.  Musculoskeletal: Normal range of motion. She exhibits no tenderness or deformity.  Patient has trace to 1+ edema bilateral ankles. Calves are soft and nontender.  Neurological: She is alert and oriented to person, place, and time. No cranial nerve deficit. She exhibits normal muscle tone. Coordination normal.  Skin: Skin is warm and dry.  Psychiatric: She has a normal mood and affect.     ED Treatments / Results  Labs (all labs ordered are listed, but only abnormal results are displayed) Labs Reviewed - No data to display  EKG  EKG Interpretation None       Radiology No results found.  Procedures Procedures (including critical care time)  Medications Ordered in ED Medications - No data to display   Initial Impression / Assessment and Plan / ED Course  I have reviewed the triage vital signs and the nursing notes.  Pertinent labs & imaging results that were available during my  care of the patient were reviewed by me and considered in my medical decision making (see chart for details).  Clinical Course     Final Clinical Impressions(s) / ED Diagnoses   Final diagnoses:  Malaise and fatigue   At this time, patient has no localizing symptoms. She does have multiple medical problems listed however is generally well in appearance today. She is well-groomed shows no signs of respiratory distress and mood is good. She also sites possible stress and current social issues as etiology of symptoms. At this time she is counseled on signs and symptoms which return. Patient will plan to follow up with primary provider next week for recheck. New Prescriptions Discharge Medication List as of 01/25/2016 12:35 AM       Charlesetta Shanks, MD 01/25/16 [phone]

## 2016-01-25 NOTE — ED Notes (Signed)
Patient was alert, oriented and stable upon discharge. RN went over AVS and patient had no further questions.  

## 2016-01-27 ENCOUNTER — Ambulatory Visit (INDEPENDENT_AMBULATORY_CARE_PROVIDER_SITE_OTHER): Payer: Medicare Other | Admitting: Emergency Medicine

## 2016-01-27 VITALS — BP 118/70 | HR 77 | Temp 98.4°F | Resp 16 | Ht 62.0 in | Wt 106.0 lb

## 2016-01-27 DIAGNOSIS — R059 Cough, unspecified: Secondary | ICD-10-CM

## 2016-01-27 DIAGNOSIS — R5382 Chronic fatigue, unspecified: Secondary | ICD-10-CM

## 2016-01-27 DIAGNOSIS — F17200 Nicotine dependence, unspecified, uncomplicated: Secondary | ICD-10-CM | POA: Diagnosis not present

## 2016-01-27 DIAGNOSIS — N390 Urinary tract infection, site not specified: Secondary | ICD-10-CM | POA: Diagnosis not present

## 2016-01-27 DIAGNOSIS — R05 Cough: Secondary | ICD-10-CM | POA: Diagnosis not present

## 2016-01-27 DIAGNOSIS — R509 Fever, unspecified: Secondary | ICD-10-CM | POA: Diagnosis not present

## 2016-01-27 DIAGNOSIS — R700 Elevated erythrocyte sedimentation rate: Secondary | ICD-10-CM | POA: Diagnosis not present

## 2016-01-27 DIAGNOSIS — R8281 Pyuria: Secondary | ICD-10-CM

## 2016-01-27 LAB — POCT CBC
Granulocyte percent: 63 %G (ref 37–80)
HCT, POC: 35.1 % — AB (ref 37.7–47.9)
Hemoglobin: 11.7 g/dL — AB (ref 12.2–16.2)
LYMPH, POC: 1.9 (ref 0.6–3.4)
MCH, POC: 30.6 pg (ref 27–31.2)
MCHC: 33.4 g/dL (ref 31.8–35.4)
MCV: 91.5 fL (ref 80–97)
MID (CBC): 0.6 (ref 0–0.9)
MPV: 7.6 fL (ref 0–99.8)
PLATELET COUNT, POC: 260 10*3/uL (ref 142–424)
POC Granulocyte: 4.2 (ref 2–6.9)
POC LYMPH PERCENT: 28.2 %L (ref 10–50)
POC MID %: 8.8 % (ref 0–12)
RBC: 3.84 M/uL — AB (ref 4.04–5.48)
RDW, POC: 15.7 %
WBC: 6.6 10*3/uL (ref 4.6–10.2)

## 2016-01-27 LAB — POCT URINALYSIS DIP (MANUAL ENTRY)
BILIRUBIN UA: NEGATIVE
Blood, UA: NEGATIVE
GLUCOSE UA: NEGATIVE
Ketones, POC UA: NEGATIVE
LEUKOCYTES UA: NEGATIVE
NITRITE UA: NEGATIVE
PH UA: 6
Protein Ur, POC: NEGATIVE
Spec Grav, UA: 1.025
UROBILINOGEN UA: 0.2

## 2016-01-27 LAB — POC MICROSCOPIC URINALYSIS (UMFC): Mucus: ABSENT

## 2016-01-27 LAB — POCT SEDIMENTATION RATE: POCT SED RATE: 5 mm/h (ref 0–22)

## 2016-01-27 NOTE — Patient Instructions (Addendum)
continue to force fluids and rest. If no better by Monday we'll recheck and do a chest x-ray.    IF you received an x-ray today, you will receive an invoice from [facility]. Please contact [facility] at [phone] with questions or concerns regarding your invoice.   IF you received labwork today, you will receive an invoice from [employer]. Please contact [facility] at [phone] with questions or concerns regarding your invoice.   Our billing staff will not be able to assist you with questions regarding bills from these companies.  You will be contacted with the lab results as soon as they are available. The fastest way to get your results is to activate your My Chart account. Instructions are located on the last page of this paperwork. If you have not heard from [place] regarding the results in 2 weeks, please contact this office.

## 2016-01-27 NOTE — Progress Notes (Addendum)
By signing my name below, I, [name], attest that this documentation has been prepared under the direction and in the presence of Arlyss Queen, MD. Electronically Signed: [name], [place]. 01/27/2016 , 11:07 AM .  Patient was seen in room 11 .  Chief Complaint:  Chief Complaint  Patient presents with  . Cough  . Otalgia    3 days    HPI: [name] is a 65 y.o. female who reports to [facility] today complaining of cough and otalgia that started 3 days ago. Patient states she's been bedridden for the past 4 days, feeling fatigue and flu-like, with tmax 100.2. She's been taking aspirin at home. She denies vomiting or dysuria. She's brought in by her roommate, [name], who denies any symptoms.   She is still a current smoker and has been trying to work on cessation.   Past Medical History:  Diagnosis Date  . ACUTE DUODEN ULCER W/HEMORR PERF&OBSTRUCTION 06/26/2004   Qualifier: Diagnosis of  By: [name] MD, [name]   . Acute duodenal ulcer with hemorrhage and perforation, with obstruction ([place])   . Allergy   . Anemia   . Anginal pain ([place])    "many many years ago"  . Anxiety    sees [name] NP at Dr. Radonna Ricker office  . Arthritis    "hips" (09/06/2014)  . Asthma   . Barrett's esophagus   . Chronic abdominal pain    narcotic dependence, dr gyarteng-dak at [facility]  . 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 Complication of anesthesia    pt states had too much xanax on board - agitated   . COPD (chronic obstructive pulmonary disease) ([place])   . Depression   . Exertional shortness of breath   . FEVER, RECURRENT 08/11/2009   Qualifier: Diagnosis of  By: [name] MD, [name]   . Fx of fibula 07-28-11   left fibula, 2 places   . Fx two ribs-open 08-24-11   left 4th and 5th  . GASTRIC OUTLET OBSTRUCTION 08/28/2007   In July 2008 she underwent laparoscopic enterolysis, Nissen fundoplication over a [id number] bougie, single pledgeted suture colosure of  the hiatus and a 3 suture wrap.  Lap truncal vagotomy and a loop gastrojejunostomy was performed.  This was revised in August of 2009 to a roux en Y gastrojejujostomy.     . Gastroparesis 06/03/2007   Qualifier: Diagnosis of  By: [name] MD, [name]   . GERD (gastroesophageal reflux disease)   . Headache    "monthly" (09/06/2014)  . History of blood transfusion    "related to OR; maybe when I perforated that ulcer"  . History of hiatal hernia   . Lap Nissen + truncal vagotomy July 2008 05/13/2013  . LEG EDEMA 01/19/2008   Qualifier: Diagnosis of  By: [name] MD, [name]   . Menopause   . MENOPAUSE 01/07/2007   Qualifier: Diagnosis of  By: [name], CMA, [place]    . Myocardial infarction ([place])   . Narcotic abuse   . Peptic ulcer disease    dr Oletta Lamas  . Pneumonia, organism unspecified 11/07/2010   Assoc with R Parapneumonic effusion 09/2010    - Tapped 10/05/10    - CxR resolved 11/07/2010    . S/P jejunostomy Feb 2016 07/26/2014   Past Surgical History:  Procedure Laterality Date  . BIOPSY THYROID  08-13-11   benign nodule, per Dr. Melida Quitter   . CATARACT EXTRACTION W/ INTRAOCULAR LENS  IMPLANT, BILATERAL Bilateral   . COLONOSCOPY    . DILATION AND CURETTAGE OF UTERUS    . ESOPHAGOGASTRODUODENOSCOPY  12-29-09   dr qadeer at [facility], several gastric ulcers  . ESOPHAGOGASTRODUODENOSCOPY N/A 09/25/2012   Procedure: ESOPHAGOGASTRODUODENOSCOPY (EGD);  Surgeon: Beryle Beams, MD;  Location: [facility];  Service: Endoscopy;  Laterality: N/A;  . FRACTURE SURGERY    . GASTROJEJUNOSTOMY  02-20-08   Roux en Y gastrojejunsotomy w 1 foot Roux limb  . GASTROSTOMY N/A 07/26/2014   Procedure: LAPRASCOPIC ASSISTED OPEN PLACEMENT OF JEJUNOSTOMY TUBE;  Surgeon: Pedro Earls, MD;  Location: [facility];  Service: General;  Laterality: N/A;  . HERNIA REPAIR     "hiatal"  . JOINT REPLACEMENT    . LAPAROSCOPIC NISSEN FUNDOPLICATION    . LAPAROSCOPIC PARTIAL GASTRECTOMY N/A 06/29/2013   Procedure: Gastrectomy and GJ  Tube placement;  Surgeon: Pedro Earls, MD;  Location: [facility];  Service: General;  Laterality: N/A;  . ORIF HIP FRACTURE Bilateral    "pins in both of them"  . REPAIR OF PERFORATED ULCER    . TONSILLECTOMY     Social History   Social History  . Marital status: Divorced    Spouse name: N/A  . Number of children: N/A  . Years of education: N/A   Social History Main Topics  . Smoking status: Current Every Day Smoker    Packs/day: 2.00    Years: 50.00    Types: Cigarettes  . Smokeless tobacco: Never Used  . Alcohol use No  . Drug use: No  . Sexual activity: No   Other Topics Concern  . None   Social History Narrative  . None   Family History  Problem Relation Age of Onset  . Cancer Mother     kidney, magliant tumor on face  . Stroke Mother   . Celiac disease Father   . Cancer Father     kidney and pancreatic   Allergies  Allergen Reactions  . Lithium Nausea And Vomiting  . Celebrex [Celecoxib] Other (See Comments)    History of bleeding ulcers  . Morphine Itching  . Quetiapine Other (See Comments)     tardive dyskinesia  . Varenicline Tartrate Other (See Comments)    hallucinations   Prior to Admission medications   Medication Sig Start Date End Date Taking? Authorizing Provider  albuterol (PROVENTIL HFA;VENTOLIN HFA) 108 (90 Base) MCG/ACT inhaler Inhale 2 puffs into the lungs every 4 (four) hours as needed (cough, shortness of breath or wheezing.). 08/01/15   Shawnee Knapp, MD  ALPRAZolam [name]) 1 MG tablet Take 1 tablet (1 mg total) by mouth 2 (two) times daily as needed for anxiety. This is a 10 day supply. May fill today 01/10/16   Shawnee Knapp, MD  ALPRAZolam [name]) 1 MG tablet Take 1 tablet (1 mg total) by mouth 2 (two) times daily as needed for anxiety. This is a 1 week supply. May fill on or after 01/20/2016 01/10/16   Shawnee Knapp, MD  ALPRAZolam [name]) 1 MG tablet Take 1 tablet (1 mg total) by mouth 2 (two) times daily as needed for anxiety. This is a 1 week  supply. May fill on or after 01/27/2016 01/10/16   Shawnee Knapp, MD  ALPRAZolam [name]) 1 MG tablet Take 1 tablet (1 mg total) by mouth 2 (two) times daily as needed for anxiety. This is a 1 week supply. May fill on or after 02/03/2016 01/10/16   Shawnee Knapp,  MD  DULoxetine (CYMBALTA) 60 MG capsule Take 1 capsule (60 mg total) by mouth daily. 10/28/15   Shawnee Knapp, MD  feeding supplement (BOOST / RESOURCE BREEZE) LIQD Take 1 Container by mouth 3 (three) times daily between meals. 12/16/15   Shawnee Knapp, MD  HYDROcodone-acetaminophen (NORCO) 10-325 MG tablet Take 1-2 tablets by mouth every 6 (six) hours as needed. This is a 1 week supply. Ok to fill Saturday 01/20/16 01/10/16   Shawnee Knapp, MD  HYDROcodone-acetaminophen [facility]) 10-325 MG tablet Take 1-2 tablets by mouth every 6 (six) hours as needed for moderate pain. This is a 1 week supply. Ok to fill Saturday 01/13/16 01/10/16   Shawnee Knapp, MD  HYDROcodone-acetaminophen [facility]) 10-325 MG tablet Take 1-2 tablets by mouth every 6 (six) hours as needed. This is a 1 week supply. Ok to fill Saturday 01/27/16 01/10/16   Shawnee Knapp, MD  HYDROcodone-acetaminophen [facility]) 10-325 MG tablet Take 1-2 tablets by mouth every 6 (six) hours as needed. This is a 1 week supply. Ok to fill Saturday 02/03/16 01/10/16   Shawnee Knapp, MD  ipratropium (ATROVENT) 0.03 % nasal spray Place 2 sprays into both nostrils 2 (two) times daily. 10/20/15   Posey Boyer, MD  ipratropium-albuterol (DUONEB) 0.5-2.5 (3) MG/3ML SOLN Take 3 mLs by nebulization 4 (four) times daily -  before meals and at bedtime. 06/03/15   Robyn Haber, MD  nicotine (NICODERM CQ - DOSED IN MG/24 HOURS) 21 mg/24hr patch Place 1-2 patches (21-42 mg total) onto the skin daily. 01/13/16   Shawnee Knapp, MD  OLANZapine (ZYPREXA) 10 MG tablet Take 1 tablet (10 mg total) by mouth at bedtime. 10/28/15   Shawnee Knapp, MD  omeprazole (PRILOSEC) 40 MG capsule TAKE ONE CAPSULE BY MOUTH TWICE DAILY 10/10/15   Shawnee Knapp, MD  ondansetron  (ZOFRAN ODT) 4 MG disintegrating tablet Take 1-2 tablets (4-8 mg total) by mouth every 8 (eight) hours as needed for nausea or vomiting. 12/11/15   Shawnee Knapp, MD  traZODone (DESYREL) 50 MG tablet Take 0.5-1 tablets (25-50 mg total) by mouth at bedtime as needed for sleep. 01/19/16   Shawnee Knapp, MD  UNABLE TO FIND Place 1 application into the nose once. Med Name: best Fit evaluation for oxygen, possibly simply go mini 08/18/15   Mancel Bale, PA-C  UNABLE TO FIND Med Name: Best fit evaluation for oxygen - possible simply go mini 08/18/15   Mancel Bale, PA-C     ROS:  Constitutional: negative for chills, night sweats, weight changes; positive for fatigue, fever HEENT: negative for vision changes, hearing loss, congestion, rhinorrhea, ST, epistaxis, or sinus pressure; positive for ear pain Cardiovascular: negative for chest pain or palpitations Respiratory: negative for hemoptysis, wheezing, shortness of breath; positive for cough Abdominal: negative for abdominal pain, nausea, vomiting, diarrhea, or constipation Dermatological: negative for rash GU: negative for dysuria Neurologic: negative for headache, dizziness, or syncope All other systems reviewed and are otherwise negative with the exception to those above and in the HPI.  PHYSICAL EXAM: Vitals:   01/27/16 1025  BP: 118/70  Pulse: 77  Resp: 16  Temp: 98.4 F (36.9 C)   Body mass index is 19.39 kg/m.   General: Alert, no acute distress, not ill-appearing HEENT:  Normocephalic, atraumatic, oropharynx patent. Eye: EOMI, [facility] Neck: supple Cardiovascular:  Regular rate and rhythm, no rubs murmurs or gallops.  No Carotid bruits, radial pulse intact. No pedal edema.  Respiratory: Decreased breath sound in bases, Clear to auscultation bilaterally.  No wheezes, rales, or rhonchi.  No cyanosis, no use of accessory musculature Abdominal: flat with multiple scars, narrow hyper- and hypo- pigmented areas without tenderness, No  organomegaly, abdomen is soft and non-tender, positive bowel sounds. No masses. Musculoskeletal: Gait intact. No edema, tenderness Skin: No rashes. Neurologic: Facial musculature symmetric. Psychiatric: Patient acts appropriately throughout our interaction.  Lymphatic: No cervical or submandibular lymphadenopathy Genitourinary/Anorectal: No acute findings  LABS: Results for orders placed or performed in visit on 01/27/16  POCT CBC  Result Value Ref Range   WBC 6.6 4.6 - 10.2 K/uL   Lymph, poc 1.9 0.6 - 3.4   POC LYMPH PERCENT 28.2 10 - 50 %L   MID (cbc) 0.6 0 - 0.9   POC MID % 8.8 0 - 12 %M   POC Granulocyte 4.2 2 - 6.9   Granulocyte percent 63.0 37 - 80 %G   RBC 3.84 (A) 4.04 - 5.48 M/uL   Hemoglobin 11.7 (A) 12.2 - 16.2 g/dL   HCT, POC 35.1 (A) 37.7 - 47.9 %   MCV 91.5 80 - 97 fL   MCH, POC 30.6 27 - 31.2 pg   MCHC 33.4 31.8 - 35.4 g/dL   RDW, POC 15.7 %   Platelet Count, POC 260 142 - 424 K/uL   MPV 7.6 0 - 99.8 fL  POCT urinalysis dipstick  Result Value Ref Range   Color, UA yellow yellow   Clarity, UA clear clear   Glucose, UA negative negative   Bilirubin, UA negative negative   Ketones, POC UA negative negative   Spec Grav, UA 1.025    Blood, UA negative negative   pH, UA 6.0    Protein Ur, POC negative negative   Urobilinogen, UA 0.2    Nitrite, UA Negative Negative   Leukocytes, UA Negative Negative  POCT Microscopic Urinalysis ([facility])  Result Value Ref Range   WBC,UR,HPF,POC Few (A) None WBC/hpf   RBC,UR,HPF,POC Few (A) None RBC/hpf   Bacteria Few (A) None, Too numerous to count   Mucus Absent Absent   Epithelial Cells, UR Per Microscopy Few (A) None, Too numerous to count cells/hpf     EKG/XRAY:     ASSESSMENT/PLAN: Labs were essentially unremarkable. She did have a few bacteria and white cells but negative nitrite. We'll go ahead and do a urine culture. Sedimentation rate was done in follow-up of a slightly elevated sedimentation rate previously.I  personally performed the services described in this documentation, which was scribed in my presence. The recorded information has been reviewed and is accurate.  Gross sideeffects, risk and benefits, and alternatives of medications d/w patient. Patient is aware that all medications have potential sideeffects and we are unable to predict every sideeffect or drug-drug interaction that may occur.  Arlyss Queen MD 01/27/2016 10:46 AM

## 2016-01-28 LAB — URINE CULTURE

## 2016-01-29 DIAGNOSIS — J449 Chronic obstructive pulmonary disease, unspecified: Secondary | ICD-10-CM | POA: Diagnosis not present

## 2016-01-30 ENCOUNTER — Encounter: Payer: Self-pay | Admitting: Family Medicine

## 2016-02-03 ENCOUNTER — Encounter: Payer: Self-pay | Admitting: Physician Assistant

## 2016-02-03 ENCOUNTER — Ambulatory Visit (INDEPENDENT_AMBULATORY_CARE_PROVIDER_SITE_OTHER): Payer: Medicare Other | Admitting: Physician Assistant

## 2016-02-03 VITALS — BP 102/62 | HR 74 | Temp 99.4°F | Resp 17 | Ht 62.0 in | Wt 101.0 lb

## 2016-02-03 DIAGNOSIS — R531 Weakness: Secondary | ICD-10-CM

## 2016-02-03 DIAGNOSIS — Z760 Encounter for issue of repeat prescription: Secondary | ICD-10-CM | POA: Diagnosis not present

## 2016-02-03 MED ORDER — HYDROCODONE-ACETAMINOPHEN 10-325 MG PO TABS: 1.0000 | ORAL_TABLET | Freq: Four times a day (QID) | ORAL | 0 refills | Status: DC | PRN

## 2016-02-03 NOTE — Progress Notes (Signed)
[name]  MRN: [id number] DOB: 1950/10/18  PCP: [name], MD  Subjective:  Pt is a 65 year-old female with an extensive medical history managed by Dr. Brigitte Pulse presenting to clinic for weakness.  She is distressed and tearful at the start of the initial encounter, stating she can no longer get out of bed and thinks she is on her way to death, "I know I am close to the end".   She is under the impression Dr. Brigitte Pulse told her during a pelvic exam that she has an autoimmune disease and now is afraid she has AIDs.   Denies any specific ailments, instead states she is so weak and tired she can't get out of bed.  +difficulty sleeping.   She is out of her weekly prescription of Norco and would like more to get her through to her appointment in 2 days. Her roommate is in charge of keeping and dispensing her Norco in order to keep her from "gobbling them all up". She says she probably took extra doses recently because of an increased pain level.   Review of Systems  Constitutional: Positive for activity change, appetite change and fatigue. Negative for chills, diaphoresis and fever.  Respiratory: Negative for cough, chest tightness and shortness of breath.   Cardiovascular: Negative for chest pain and palpitations.  Gastrointestinal: Negative for constipation, diarrhea, nausea and vomiting.  Neurological: Positive for weakness. Negative for dizziness and light-headedness.  Psychiatric/Behavioral: Positive for sleep disturbance. The patient is nervous/anxious.     Patient Active Problem List   Diagnosis Date Noted  . Abdominal pain   . Hypoxia 04/08/2015  . Opioid use with withdrawal ([place])   . Protein-calorie malnutrition ([place])   . Episode of recurrent major depressive disorder ([place])   . Secondary hyperparathyroidism ([place]) 01/10/2015  . Vitamin D deficiency 01/10/2015  . 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 Anemia, iron deficiency 11/03/2014  . Chronic pain syndrome 10/24/2014    . Osteoporosis 10/05/2014  . Vertebral compression fracture ([place]) 10/05/2014  . Coronary artery disease due to calcified coronary lesion 10/05/2014  . Weight loss   . Tobacco abuse   . History of subtotal gastrectomy with Roux-en-Y recontruction   . Orthostatic hypotension 08/11/2014  . Severe protein-calorie malnutrition ([place]) 12/30/2013  . Constipation, chronic 07/10/2013  . Chronic abdominal pain   . COPD (chronic obstructive pulmonary disease) ([place]) 11/07/2010  . Major depressive disorder, recurrent episode, moderate ([place]) 06/06/2010  . 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 Anxiety state 01/07/2007  . BARRETT'S ESOPHAGUS, HX OF 07/03/2005    Current Outpatient Prescriptions on File Prior to Visit  Medication Sig Dispense Refill  . albuterol (PROVENTIL HFA;VENTOLIN HFA) 108 (90 Base) MCG/ACT inhaler Inhale 2 puffs into the lungs every 4 (four) hours as needed (cough, shortness of breath or wheezing.). 1 Inhaler 3  . ALPRAZolam (XANAX) 1 MG tablet Take 1 tablet (1 mg total) by mouth 2 (two) times daily as needed for anxiety. This is a 10 day supply. May fill today 14 tablet 0  . ALPRAZolam (XANAX) 1 MG tablet Take 1 tablet (1 mg total) by mouth 2 (two) times daily as needed for anxiety. This is a 1 week supply. May fill on or after 01/20/2016 10 tablet 0  . ALPRAZolam (XANAX) 1 MG tablet Take 1 tablet (1 mg total) by mouth 2 (two) times daily as needed for anxiety. This is a 1 week supply. May fill on or after 01/27/2016 10 tablet 0  . ALPRAZolam [name])  1 MG tablet Take 1 tablet (1 mg total) by mouth 2 (two) times daily as needed for anxiety. This is a 1 week supply. May fill on or after 02/03/2016 10 tablet 0  . DULoxetine (CYMBALTA) 60 MG capsule Take 1 capsule (60 mg total) by mouth daily. 30 capsule 1  . feeding supplement (BOOST / RESOURCE BREEZE) LIQD Take 1 Container by mouth 3 (three) times daily between meals. 90 Container 11  . HYDROcodone-acetaminophen (NORCO) 10-325 MG tablet Take  1-2 tablets by mouth every 6 (six) hours as needed. This is a 1 week supply. Ok to fill Saturday 01/20/16 60 tablet 0  . HYDROcodone-acetaminophen (NORCO) 10-325 MG tablet Take 1-2 tablets by mouth every 6 (six) hours as needed for moderate pain. This is a 1 week supply. Ok to fill Saturday 01/13/16 60 tablet 0  . HYDROcodone-acetaminophen (NORCO) 10-325 MG tablet Take 1-2 tablets by mouth every 6 (six) hours as needed. This is a 1 week supply. Ok to fill Saturday 01/27/16 60 tablet 0  . HYDROcodone-acetaminophen (NORCO) 10-325 MG tablet Take 1-2 tablets by mouth every 6 (six) hours as needed. This is a 1 week supply. Ok to fill Saturday 02/03/16 60 tablet 0  . ipratropium (ATROVENT) 0.03 % nasal spray Place 2 sprays into both nostrils 2 (two) times daily. 30 mL 0  . ipratropium-albuterol (DUONEB) 0.5-2.5 (3) MG/3ML SOLN Take 3 mLs by nebulization 4 (four) times daily -  before meals and at bedtime. 360 mL 3  . nicotine (NICODERM CQ - DOSED IN MG/24 HOURS) 21 mg/24hr patch Place 1-2 patches (21-42 mg total) onto the skin daily. 56 patch 0  . OLANZapine (ZYPREXA) 10 MG tablet Take 1 tablet (10 mg total) by mouth at bedtime. 30 tablet 5  . omeprazole (PRILOSEC) 40 MG capsule TAKE ONE CAPSULE BY MOUTH TWICE DAILY 180 capsule 3  . ondansetron (ZOFRAN ODT) 4 MG disintegrating tablet Take 1-2 tablets (4-8 mg total) by mouth every 8 (eight) hours as needed for nausea or vomiting. 180 tablet 0  . traZODone (DESYREL) 50 MG tablet Take 0.5-1 tablets (25-50 mg total) by mouth at bedtime as needed for sleep. 30 tablet 3  . UNABLE TO FIND Place 1 application into the nose once. Med Name: best Fit evaluation for oxygen, possibly simply go mini 1 Device 0  . UNABLE TO FIND Med Name: Best fit evaluation for oxygen - possible simply go mini 1 Device 0   No current facility-administered medications on file prior to visit.     Allergies  Allergen Reactions  . Lithium Nausea And Vomiting  . Celebrex [Celecoxib]  Other (See Comments)    History of bleeding ulcers  . Morphine Itching  . Quetiapine Other (See Comments)     tardive dyskinesia  . Varenicline Tartrate Other (See Comments)    hallucinations    Objective:  BP 102/62 (BP Location: Right Arm, Patient Position: Sitting, Cuff Size: Normal)   Pulse 74   Temp 99.4 F (37.4 C) (Oral)   Resp 17   Ht 5\' 2"  (1.575 m)   Wt 101 lb (45.8 kg)   SpO2 97%   BMI 18.47 kg/m   Physical Exam  Constitutional: She is oriented to person, place, and time. She appears distressed.  HENT:  Head: Normocephalic and atraumatic.  Cardiovascular: Normal rate and regular rhythm.   Pulmonary/Chest: Effort normal. No respiratory distress.  Abdominal: Soft. There is no tenderness.  Neurological: She is alert and oriented to person, place, and time.  GCS score is 15.  Skin: Skin is warm and dry.  Psychiatric: Memory, affect and judgment normal.  Vitals reviewed.   Assessment and Plan :  1. Medication refill - Agreed to prescribe enough Norco so she does not experience withdrawal before her appointment with Dr. Brigitte Pulse in two days. This was discussed with the patient and she understands.  - HYDROcodone-acetaminophen (NORCO) 10-325 MG tablet; Take 1 tablet by mouth every 6 (six) hours as needed.  Dispense: 8 tablet; Refill: 0  2. Weakness    Mercer Pod, PA-C  [facility] 02/03/2016 3:14 PM

## 2016-02-03 NOTE — Patient Instructions (Signed)
     IF you received an x-ray today, you will receive an invoice from [facility]. Please contact [facility] at [phone] with questions or concerns regarding your invoice.   IF you received labwork today, you will receive an invoice from [facility]. Please contact [facility] at [phone] with questions or concerns regarding your invoice.   Our billing staff will not be able to assist you with questions regarding bills from these companies.  You will be contacted with the lab results as soon as they are available. The fastest way to get your results is to activate your My Chart account. Instructions are located on the last page of this paperwork. If you have not heard from us regarding the results in 2 weeks, please contact this office.      

## 2016-02-05 ENCOUNTER — Ambulatory Visit (INDEPENDENT_AMBULATORY_CARE_PROVIDER_SITE_OTHER): Payer: Medicare Other | Admitting: Family Medicine

## 2016-02-05 ENCOUNTER — Telehealth: Payer: Self-pay

## 2016-02-05 VITALS — BP 120/78 | HR 64 | Temp 98.6°F | Resp 18 | Ht 62.0 in | Wt 102.3 lb

## 2016-02-05 DIAGNOSIS — G894 Chronic pain syndrome: Secondary | ICD-10-CM | POA: Diagnosis not present

## 2016-02-05 DIAGNOSIS — F4323 Adjustment disorder with mixed anxiety and depressed mood: Secondary | ICD-10-CM

## 2016-02-05 DIAGNOSIS — R1084 Generalized abdominal pain: Secondary | ICD-10-CM

## 2016-02-05 DIAGNOSIS — Z760 Encounter for issue of repeat prescription: Secondary | ICD-10-CM

## 2016-02-05 DIAGNOSIS — H3530 Unspecified macular degeneration: Secondary | ICD-10-CM

## 2016-02-05 MED ORDER — ALPRAZOLAM 1 MG PO TABS
1.0000 mg | ORAL_TABLET | Freq: Two times a day (BID) | ORAL | 0 refills | Status: DC | PRN
Start: 2016-02-05 — End: 2016-03-02

## 2016-02-05 MED ORDER — HYDROCODONE-ACETAMINOPHEN 10-325 MG PO TABS: 1.0000 | ORAL_TABLET | Freq: Four times a day (QID) | ORAL | 0 refills | Status: DC | PRN

## 2016-02-05 MED ORDER — ALPRAZOLAM 1 MG PO TABS: 1.0000 mg | ORAL_TABLET | Freq: Two times a day (BID) | ORAL | 0 refills | Status: DC | PRN

## 2016-02-05 MED ORDER — GI COCKTAIL ~~LOC~~
30.0000 mL | Freq: Once | ORAL | Status: AC
Start: 2016-02-05 — End: 2016-02-05
  Administered 2016-02-05: 30 mL via ORAL

## 2016-02-05 NOTE — Telephone Encounter (Signed)
Yes, this is fine - I wanted pt to fill the rx on file from 8/12 and I don't care if they want to push the other rxs back by 2ds that is fine.

## 2016-02-05 NOTE — Telephone Encounter (Signed)
[facility] pharmacist, Juliann Pulse, called to report the following re: pt's Norco Rxs. Pt had one RF left from the set written on 7/19 and shouldn't have needed to come in and get the #8 on 8/12 to cover her for 2 days. Pt did get the #8 filled on 8/12 and then came into see Dr Brigitte Pulse today 8/14 and got another set of Rxs, but they are written to start filling on 8/19, and she is due to have something filled today. Pharm asked if they can go ahead and fill the last one written on 7/19, and then move the fill dates on the new Rxs back by two days, so the first of these wouldn't be due to be filled until 8/21, and so forth. Dr Brigitte Pulse was in a room with a pt, cut discussed with clinical manager, and Sharee Pimple agreed that this should be fine to OK. Dr Brigitte Pulse, I am forwarding this to you FYI. Please let me know if you want me to call pharm and change anything. Also, pharm reported that the alprazolam Rxs were fine and nothing needs to be adjusted with those.

## 2016-02-05 NOTE — Patient Instructions (Signed)
     IF you received an x-ray today, you will receive an invoice from [facility]. Please contact [facility] at [phone] with questions or concerns regarding your invoice.   IF you received labwork today, you will receive an invoice from [facility]. Please contact [facility] at [phone] with questions or concerns regarding your invoice.   Our billing staff will not be able to assist you with questions regarding bills from these companies.  You will be contacted with the lab results as soon as they are available. The fastest way to get your results is to activate your My Chart account. Instructions are located on the last page of this paperwork. If you have not heard from us regarding the results in 2 weeks, please contact this office.      

## 2016-02-09 ENCOUNTER — Ambulatory Visit: Payer: Medicare Other

## 2016-02-10 ENCOUNTER — Ambulatory Visit (INDEPENDENT_AMBULATORY_CARE_PROVIDER_SITE_OTHER): Payer: Medicare Other | Admitting: Family Medicine

## 2016-02-10 VITALS — BP 116/74 | HR 86 | Temp 99.9°F | Resp 18 | Ht 62.0 in | Wt 103.8 lb

## 2016-02-10 DIAGNOSIS — R1084 Generalized abdominal pain: Secondary | ICD-10-CM | POA: Diagnosis not present

## 2016-02-10 MED ORDER — ONDANSETRON 4 MG PO TBDP: 4.0000 mg | ORAL_TABLET | Freq: Three times a day (TID) | ORAL | 0 refills | Status: DC | PRN

## 2016-02-10 MED ORDER — TRAZODONE HCL 50 MG PO TABS: 50.0000 mg | ORAL_TABLET | Freq: Every evening | ORAL | 3 refills | Status: DC | PRN

## 2016-02-10 MED ORDER — GI COCKTAIL ~~LOC~~
30.0000 mL | Freq: Once | ORAL | Status: AC
  Administered 2016-02-10: 30 mL via ORAL

## 2016-02-10 MED ORDER — GI COCKTAIL ~~LOC~~: 30.0000 mL | Freq: Two times a day (BID) | ORAL | 0 refills | Status: DC | PRN

## 2016-02-10 NOTE — Progress Notes (Signed)
Subjective:  By signing my name below, I, [name], attest that this documentation has been prepared under the direction and in the presence of [name], MD. Electronically Signed: [name], [place]. 02/10/2016 , 10:38 AM .  Patient was seen in Room 11 .   Patient ID: [name], female    DOB: 02-08-1951, 65 y.o.   MRN: [id number] Chief Complaint  Patient presents with  . Follow-up    MEDS   HPI [name] is a 65 y.o. female who presents to [facility] for follow up on medications. On 7/19, she was given 4 1 week supplies of her medication with hydrocodone and alprazolam to be filled Saturday, 8/12. She was seen 2 days ago by [employer], PA.   Patient states she called [facility] but was informed there wasn't any scripts available for her. Patient also notes being in a lot of pain and tearful regarding abdominal pain.   Patient also notes having degenerative vision changes. She has seen Dr. Katy Fitch in the past for cataracts surgery.   Past Medical History:  Diagnosis Date  . ACUTE DUODEN ULCER W/HEMORR PERF&OBSTRUCTION 06/26/2004   Qualifier: Diagnosis of  By: [name] MD, [name]   . Acute duodenal ulcer with hemorrhage and perforation, with obstruction ([place])   . Allergy   . Anemia   . Anginal pain ([place])    "many many years ago"  . Anxiety    sees [name] NP at Dr. Radonna Ricker office  . Arthritis    "hips" (09/06/2014)  . Asthma   . Barrett's esophagus   . Chronic abdominal pain    narcotic dependence, dr gyarteng-dak at [facility]  . 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 Complication of anesthesia    pt states had too much xanax on board - agitated   . COPD (chronic obstructive pulmonary disease) ([place])   . Depression   . Exertional shortness of breath   . FEVER, RECURRENT 08/11/2009   Qualifier: Diagnosis of  By: [name] MD, [name]   . Fx of fibula 07-28-11   left fibula, 2 places   . Fx two ribs-open 08-24-11   left  4th and 5th  . GASTRIC OUTLET OBSTRUCTION 08/28/2007   In July 2008 she underwent laparoscopic enterolysis, Nissen fundoplication over a [id number] bougie, single pledgeted suture colosure of the hiatus and a 3 suture wrap.  Lap truncal vagotomy and a loop gastrojejunostomy was performed.  This was revised in August of 2009 to a roux en Y gastrojejujostomy.     . Gastroparesis 06/03/2007   Qualifier: Diagnosis of  By: [name] MD, [name]   . GERD (gastroesophageal reflux disease)   . Headache    "monthly" (09/06/2014)  . History of blood transfusion    "related to OR; maybe when I perforated that ulcer"  . History of hiatal hernia   . Lap Nissen + truncal vagotomy July 2008 05/13/2013  . LEG EDEMA 01/19/2008   Qualifier: Diagnosis of  By: [name] MD, [name]   . Menopause   . MENOPAUSE 01/07/2007   Qualifier: Diagnosis of  By: [name], CMA, [place]    . Myocardial infarction ([place])   . Narcotic abuse   . Peptic ulcer disease    dr Oletta Lamas  . Pneumonia, organism unspecified 11/07/2010   Assoc with R Parapneumonic effusion 09/2010    - Tapped 10/05/10    - CxR resolved 11/07/2010    . S/P jejunostomy  Feb 2016 07/26/2014   Prior to Admission medications   Medication Sig Start Date End Date Taking? Authorizing Provider  albuterol (PROVENTIL HFA;VENTOLIN HFA) 108 (90 Base) MCG/ACT inhaler Inhale 2 puffs into the lungs every 4 (four) hours as needed (cough, shortness of breath or wheezing.). 08/01/15  Yes Shawnee Knapp, MD  ALPRAZolam [name]) 1 MG tablet Take 1 tablet (1 mg total) by mouth 2 (two) times daily as needed for anxiety. This is a 10 day supply. May fill today 01/10/16  Yes Shawnee Knapp, MD  ALPRAZolam [name]) 1 MG tablet Take 1 tablet (1 mg total) by mouth 2 (two) times daily as needed for anxiety. This is a 1 week supply. May fill on or after 01/20/2016 01/10/16  Yes Shawnee Knapp, MD  ALPRAZolam [name]) 1 MG tablet Take 1 tablet (1 mg total) by mouth 2 (two) times daily as needed for anxiety. This is a 1 week supply.  May fill on or after 01/27/2016 01/10/16  Yes Shawnee Knapp, MD  ALPRAZolam [name]) 1 MG tablet Take 1 tablet (1 mg total) by mouth 2 (two) times daily as needed for anxiety. This is a 1 week supply. May fill on or after 02/03/2016 01/10/16  Yes Shawnee Knapp, MD  DULoxetine (CYMBALTA) 60 MG capsule Take 1 capsule (60 mg total) by mouth daily. 10/28/15  Yes Shawnee Knapp, MD  feeding supplement (BOOST / RESOURCE BREEZE) LIQD Take 1 Container by mouth 3 (three) times daily between meals. 12/16/15  Yes Shawnee Knapp, MD  HYDROcodone-acetaminophen [facility]) 10-325 MG tablet Take 1-2 tablets by mouth every 6 (six) hours as needed. This is a 1 week supply. Ok to fill Saturday 01/20/16 01/10/16  Yes Shawnee Knapp, MD  HYDROcodone-acetaminophen [facility]) 10-325 MG tablet Take 1-2 tablets by mouth every 6 (six) hours as needed for moderate pain. This is a 1 week supply. Ok to fill Saturday 01/13/16 01/10/16  Yes Shawnee Knapp, MD  HYDROcodone-acetaminophen [facility]) 10-325 MG tablet Take 1-2 tablets by mouth every 6 (six) hours as needed. This is a 1 week supply. Ok to fill Saturday 01/27/16 01/10/16  Yes Shawnee Knapp, MD  HYDROcodone-acetaminophen [facility]) 10-325 MG tablet Take 1-2 tablets by mouth every 6 (six) hours as needed. This is a 1 week supply. Ok to fill Saturday 02/03/16 01/10/16  Yes Shawnee Knapp, MD  HYDROcodone-acetaminophen [facility]) 10-325 MG tablet Take 1 tablet by mouth every 6 (six) hours as needed. 02/03/16  Yes Ree Shay McVey, PA-C  ipratropium (ATROVENT) 0.03 % nasal spray Place 2 sprays into both nostrils 2 (two) times daily. 10/20/15  Yes Posey Boyer, MD  ipratropium-albuterol (DUONEB) 0.5-2.5 (3) MG/3ML SOLN Take 3 mLs by nebulization 4 (four) times daily -  before meals and at bedtime. 06/03/15  Yes Robyn Haber, MD  nicotine (NICODERM CQ - DOSED IN MG/24 HOURS) 21 mg/24hr patch Place 1-2 patches (21-42 mg total) onto the skin daily. 01/13/16  Yes Shawnee Knapp, MD  OLANZapine (ZYPREXA) 10 MG tablet Take 1 tablet (10 mg  total) by mouth at bedtime. 10/28/15  Yes Shawnee Knapp, MD  omeprazole (PRILOSEC) 40 MG capsule TAKE ONE CAPSULE BY MOUTH TWICE DAILY 10/10/15  Yes Shawnee Knapp, MD  ondansetron (ZOFRAN ODT) 4 MG disintegrating tablet Take 1-2 tablets (4-8 mg total) by mouth every 8 (eight) hours as needed for nausea or vomiting. 12/11/15  Yes Shawnee Knapp, MD  traZODone (DESYREL) 50 MG tablet Take 0.5-1 tablets (  25-50 mg total) by mouth at bedtime as needed for sleep. 01/19/16  Yes Shawnee Knapp, MD  UNABLE TO FIND Place 1 application into the nose once. Med Name: best Fit evaluation for oxygen, possibly simply go mini 08/18/15  Yes Mancel Bale, PA-C  UNABLE TO FIND Med Name: Best fit evaluation for oxygen - possible simply go mini 08/18/15  Yes Mancel Bale, PA-C   Allergies  Allergen Reactions  . Lithium Nausea And Vomiting  . Celebrex [Celecoxib] Other (See Comments)    History of bleeding ulcers  . Morphine Itching  . Quetiapine Other (See Comments)     tardive dyskinesia  . Varenicline Tartrate Other (See Comments)    hallucinations   Review of Systems  Constitutional: Positive for fatigue. Negative for chills and fever.  Respiratory: Negative for cough, chest tightness, shortness of breath and wheezing.   Cardiovascular: Negative for chest pain.  Gastrointestinal: Positive for abdominal pain. Negative for constipation, diarrhea, nausea and vomiting.       Objective:   Physical Exam  Constitutional: She is oriented to person, place, and time. She appears well-developed and well-nourished. No distress.  HENT:  Head: Normocephalic and atraumatic.  Eyes: EOM are normal. Pupils are equal, round, and reactive to light.  Neck: Neck supple.  Cardiovascular: Normal rate.   Pulmonary/Chest: Effort normal. No respiratory distress.  Musculoskeletal: Normal range of motion.  Neurological: She is alert and oriented to person, place, and time.  Skin: Skin is warm and dry.  Psychiatric: She has a normal mood and  affect. Her behavior is normal.  Nursing note and vitals reviewed.   BP 120/78 (BP Location: Right Arm, Patient Position: Sitting, Cuff Size: Small)   Pulse 64   Temp 98.6 F (37 C) (Oral)   Resp 18   Ht 5\' 2"  (1.575 m)   Wt 102 lb 4.8 oz (46.4 kg)   SpO2 97%   BMI 18.71 kg/m     Assessment & Plan:  Had pill bottle for zyprexa #30 dispensed on 6/1, same on 7/24, both of which were full and the 6/1 bottle appeared to have had multiple others poured into it, as more than 30 were present. And she had duloxetine 60 filled end of July, patient still had approximately 20 pills in a bottle of #30.   1. Generalized abdominal pain - responded well to Gi cocktail in office  2. Chronic pain syndrome - getting wkly supply of hydrocodone 10 8 tabs/d (#60/wk) - have 1 mo of rx on file at [facility].  3. Medication refill   4. Adjustment disorder with mixed anxiety and depressed mood - starting very slow wean off of xanax since she is on narcotics for pain control. Get wkly rx for xanax with 1 mo supply [facility] today. Trouble sleeping so try trazodone  5. Macular degeneration - referral back to Dr. Katy Fitch for further eval.     Orders Placed This Encounter  Procedures  . Ambulatory referral to Ophthalmology    Referral Priority:   Routine    Referral Type:   Consultation    Referral Reason:   Specialty Services Required    Requested Specialty:   Ophthalmology    Number of Visits Requested:   1    Meds ordered this encounter  Medications  . gi cocktail (Maalox,Lidocaine,Donnatal)  . HYDROcodone-acetaminophen (NORCO) 10-325 MG tablet    Sig: Take 1-2 tablets by mouth every 6 (six) hours as needed. This is  a 1 week supply. Ok to fill Saturday 02/10/16    Dispense:  60 tablet    Refill:  0  . HYDROcodone-acetaminophen (NORCO) 10-325 MG tablet    Sig: Take 1-2 tablets by mouth every 6 (six) hours as needed for moderate pain. This is a 1 week supply. Ok to fill Saturday  02/17/16    Dispense:  60 tablet    Refill:  0  . HYDROcodone-acetaminophen (NORCO) 10-325 MG tablet    Sig: Take 1-2 tablets by mouth every 6 (six) hours as needed. This is a 1 week supply. Ok to fill Saturday 02/24/16    Dispense:  60 tablet    Refill:  0  . HYDROcodone-acetaminophen (NORCO) 10-325 MG tablet    Sig: Take 1-2 tablets by mouth every 6 (six) hours as needed. This is a 1 week supply. Ok to fill Saturday 03/02/16    Dispense:  60 tablet    Refill:  0  . ALPRAZolam (XANAX) 1 MG tablet    Sig: Take 1 tablet (1 mg total) by mouth 2 (two) times daily as needed for anxiety. This is a 1 week supply. May fill Saturday 8/19    Dispense:  9 tablet    Refill:  0  . ALPRAZolam (XANAX) 1 MG tablet    Sig: Take 1 tablet (1 mg total) by mouth 2 (two) times daily as needed for anxiety. This is a 1 week supply. May fill on or after 02/17/2016    Dispense:  9 tablet    Refill:  0  . ALPRAZolam (XANAX) 1 MG tablet    Sig: Take 1 tablet (1 mg total) by mouth 2 (two) times daily as needed for anxiety. This is a 1 week supply. May fill on or after 02/24/2016    Dispense:  9 tablet    Refill:  0  . ALPRAZolam (XANAX) 1 MG tablet    Sig: Take 1 tablet (1 mg total) by mouth 2 (two) times daily as needed for anxiety. This is a 1 week supply. May fill on or after 03/02/2016    Dispense:  9 tablet    Refill:  0    I personally performed the services described in this documentation, which was scribed in my presence. The recorded information has been reviewed and considered, and addended by me as needed.   [name], M.D.  [facility] [address] [place],  [address] [phone] phone [phone] fax  02/10/16 11:22 PM

## 2016-02-10 NOTE — Patient Instructions (Signed)
     IF you received an x-ray today, you will receive an invoice from [facility]. Please contact [facility] at [phone] with questions or concerns regarding your invoice.   IF you received labwork today, you will receive an invoice from [facility]. Please contact [facility] at [phone] with questions or concerns regarding your invoice.   Our billing staff will not be able to assist you with questions regarding bills from these companies.  You will be contacted with the lab results as soon as they are available. The fastest way to get your results is to activate your My Chart account. Instructions are located on the last page of this paperwork. If you have not heard from us regarding the results in 2 weeks, please contact this office.      

## 2016-02-12 MED FILL — GI COCKTAIL (MAAL,LID,DONN): 20 days supply | Qty: 1200 | Fill #0 | Status: TO

## 2016-02-13 ENCOUNTER — Other Ambulatory Visit: Payer: Self-pay | Admitting: Radiology

## 2016-02-13 NOTE — Telephone Encounter (Signed)
[facility] called and stated they ended up with [name] prescription because her normal pharmacy could not fill her GI cocktail. The pharmacist at [facility] stated the ingredient donnatol is very expensive and they would lose over 600$ after insurance coverage. He would like to know if he can use levsin in place of donnatol or perhaps give pt a smaller amount?  I checked your schedule and saw that you are out until Friday 8/25. Pharmacist stated he will give her enough to get her through Friday as written.

## 2016-02-13 NOTE — Telephone Encounter (Signed)
Yes, fine to switch and give smaller amount with refills

## 2016-02-14 NOTE — Telephone Encounter (Signed)
Spoke with pharmacist she needs strength of levsin, a whole new order. Order pended

## 2016-02-15 ENCOUNTER — Other Ambulatory Visit: Payer: Self-pay | Admitting: Family Medicine

## 2016-02-15 MED ORDER — GI COCKTAIL ~~LOC~~: 30.0000 mL | Freq: Three times a day (TID) | ORAL | 5 refills | Status: DC | PRN

## 2016-02-15 NOTE — Progress Notes (Signed)
Please call in above rx per [facility] request of Please use a 1:1:1 ratio of Maalox, viscous lidocaine, and hyoscyamine elixir (0.125mg /5 mL)

## 2016-02-19 DIAGNOSIS — J449 Chronic obstructive pulmonary disease, unspecified: Secondary | ICD-10-CM | POA: Diagnosis not present

## 2016-02-21 ENCOUNTER — Telehealth: Payer: Self-pay

## 2016-02-21 NOTE — Telephone Encounter (Signed)
See orders from 8/24 which I have copied here: Please call in above rx per [facility] request of Please use a 1:1:1 ratio of Maalox, viscous lidocaine, and hyoscyamine elixir (0.125mg /5 mL). I don't think this ever made it to the pharm because I got another fax from them.  Norm Parcel, pharmacist, who stated that the ingredients in this new medication is just as expensive (over $600), and they will not get reimbursed by ins for it. His recommendation for affordable regime would be either 1:1:1 ratio of liquid lidocaine, benedryl and mylanta, OR 2/3 Maalox: 1/3 lidocaine. He would also suggest possibly Rxing the long acting Levdid BID for better control. Dr Brigitte Pulse, please review.

## 2016-02-21 NOTE — Telephone Encounter (Signed)
No, just cancel rx please. thanks

## 2016-02-23 NOTE — Telephone Encounter (Signed)
Called pharm and cancelled rx.

## 2016-02-24 ENCOUNTER — Ambulatory Visit: Payer: Medicare Other

## 2016-02-24 NOTE — Progress Notes (Signed)
Subjective:    Patient ID: [name], female    DOB: 1950-10-08, 65 y.o.   MRN: [id number] Chief Complaint  Patient presents with  . Medication Refill    been in bed for last 5 days    HPI   Past Medical History:  Diagnosis Date  . ACUTE DUODEN ULCER W/HEMORR PERF&OBSTRUCTION 06/26/2004   Qualifier: Diagnosis of  By: [name] MD, [name]   . Acute duodenal ulcer with hemorrhage and perforation, with obstruction ([place])   . Allergy   . Anemia   . Anginal pain ([place])    "many many years ago"  . Anxiety    sees [name] NP at Dr. Radonna Ricker office  . Arthritis    "hips" (09/06/2014)  . Asthma   . Barrett's esophagus   . Chronic abdominal pain    narcotic dependence, dr gyarteng-dak at [facility]  . 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 Complication of anesthesia    pt states had too much xanax on board - agitated   . COPD (chronic obstructive pulmonary disease) ([place])   . Depression   . Exertional shortness of breath   . FEVER, RECURRENT 08/11/2009   Qualifier: Diagnosis of  By: [name] MD, [name]   . Fx of fibula 07-28-11   left fibula, 2 places   . Fx two ribs-open 08-24-11   left 4th and 5th  . GASTRIC OUTLET OBSTRUCTION 08/28/2007   In July 2008 she underwent laparoscopic enterolysis, Nissen fundoplication over a [id number] bougie, single pledgeted suture colosure of the hiatus and a 3 suture wrap.  Lap truncal vagotomy and a loop gastrojejunostomy was performed.  This was revised in August of 2009 to a roux en Y gastrojejujostomy.     . Gastroparesis 06/03/2007   Qualifier: Diagnosis of  By: [name] MD, [name]   . GERD (gastroesophageal reflux disease)   . Headache    "monthly" (09/06/2014)  . History of blood transfusion    "related to OR; maybe when I perforated that ulcer"  . History of hiatal hernia   . Lap Nissen + truncal vagotomy July 2008 05/13/2013  . LEG EDEMA 01/19/2008   Qualifier: Diagnosis of  By: [name] MD, [name]   . Menopause    . MENOPAUSE 01/07/2007   Qualifier: Diagnosis of  By: [name], CMA, [place]    . Myocardial infarction ([place])   . Narcotic abuse   . Peptic ulcer disease    dr Oletta Lamas  . Pneumonia, organism unspecified 11/07/2010   Assoc with R Parapneumonic effusion 09/2010    - Tapped 10/05/10    - CxR resolved 11/07/2010    . S/P jejunostomy Feb 2016 07/26/2014   Past Surgical History:  Procedure Laterality Date  . BIOPSY THYROID  08-13-11   benign nodule, per Dr. Melida Quitter   . CATARACT EXTRACTION W/ INTRAOCULAR LENS  IMPLANT, BILATERAL Bilateral   . COLONOSCOPY    . DILATION AND CURETTAGE OF UTERUS    . ESOPHAGOGASTRODUODENOSCOPY  12-29-09   dr qadeer at [facility], several gastric ulcers  . ESOPHAGOGASTRODUODENOSCOPY N/A 09/25/2012   Procedure: ESOPHAGOGASTRODUODENOSCOPY (EGD);  Surgeon: Beryle Beams, MD;  Location: [facility];  Service: Endoscopy;  Laterality: N/A;  . FRACTURE SURGERY    . GASTROJEJUNOSTOMY  02-20-08   Roux en Y gastrojejunsotomy w 1 foot Roux limb  . GASTROSTOMY N/A 07/26/2014   Procedure: LAPRASCOPIC ASSISTED OPEN PLACEMENT OF JEJUNOSTOMY TUBE;  Surgeon: Pedro Earls, MD;  Location: [facility];  Service: General;  Laterality: N/A;  . HERNIA REPAIR     "hiatal"  . JOINT REPLACEMENT    . LAPAROSCOPIC NISSEN FUNDOPLICATION    . LAPAROSCOPIC PARTIAL GASTRECTOMY N/A 06/29/2013   Procedure: Gastrectomy and GJ Tube placement;  Surgeon: Pedro Earls, MD;  Location: [facility];  Service: General;  Laterality: N/A;  . ORIF HIP FRACTURE Bilateral    "pins in both of them"  . REPAIR OF PERFORATED ULCER    . TONSILLECTOMY     Current Outpatient Prescriptions on File Prior to Visit  Medication Sig Dispense Refill  . albuterol (PROVENTIL HFA;VENTOLIN HFA) 108 (90 Base) MCG/ACT inhaler Inhale 2 puffs into the lungs every 4 (four) hours as needed (cough, shortness of breath or wheezing.). 1 Inhaler 3  . DULoxetine (CYMBALTA) 60 MG capsule Take 1 capsule (60 mg total) by mouth daily. 30 capsule 1    . feeding supplement (BOOST / RESOURCE BREEZE) LIQD Take 1 Container by mouth 3 (three) times daily between meals. 90 Container 11  . ipratropium (ATROVENT) 0.03 % nasal spray Place 2 sprays into both nostrils 2 (two) times daily. 30 mL 0  . ipratropium-albuterol (DUONEB) 0.5-2.5 (3) MG/3ML SOLN Take 3 mLs by nebulization 4 (four) times daily -  before meals and at bedtime. 360 mL 3  . nicotine (NICODERM CQ - DOSED IN MG/24 HOURS) 21 mg/24hr patch Place 1-2 patches (21-42 mg total) onto the skin daily. 56 patch 0  . OLANZapine (ZYPREXA) 10 MG tablet Take 1 tablet (10 mg total) by mouth at bedtime. 30 tablet 5  . omeprazole (PRILOSEC) 40 MG capsule TAKE ONE CAPSULE BY MOUTH TWICE DAILY 180 capsule 3  . UNABLE TO FIND Place 1 application into the nose once. Med Name: best Fit evaluation for oxygen, possibly simply go mini 1 Device 0  . UNABLE TO FIND Med Name: Best fit evaluation for oxygen - possible simply go mini 1 Device 0   No current facility-administered medications on file prior to visit.    Allergies  Allergen Reactions  . Lithium Nausea And Vomiting  . Celebrex [Celecoxib] Other (See Comments)    History of bleeding ulcers  . Morphine Itching  . Quetiapine Other (See Comments)     tardive dyskinesia  . Varenicline Tartrate Other (See Comments)    hallucinations   Family History  Problem Relation Age of Onset  . Cancer Mother     kidney, magliant tumor on face  . Stroke Mother   . Celiac disease Father   . Cancer Father     kidney and pancreatic   Social History   Social History  . Marital status: Divorced    Spouse name: N/A  . Number of children: N/A  . Years of education: N/A   Social History Main Topics  . Smoking status: Current Every Day Smoker    Packs/day: 2.00    Years: 50.00    Types: Cigarettes  . Smokeless tobacco: Never Used  . Alcohol use No  . Drug use: No  . Sexual activity: No   Other Topics Concern  . Not on file   Social History  Narrative  . No narrative on file    Review of Systems  Constitutional: Positive for activity change, appetite change and fatigue. Negative for chills, fever and unexpected weight change.  Respiratory: Positive for cough. Negative for shortness of breath.   Cardiovascular: Negative for chest pain and leg swelling.  Gastrointestinal: Positive for abdominal pain  and nausea. Negative for abdominal distention, anal bleeding, blood in stool, constipation, diarrhea and vomiting.  Genitourinary: Negative for decreased urine volume, difficulty urinating and dysuria.  Musculoskeletal: Negative for gait problem.  Skin: Negative for rash.  Neurological: Positive for light-headedness.  Hematological: Negative for adenopathy.  Psychiatric/Behavioral: Positive for agitation, behavioral problems, confusion, decreased concentration, dysphoric mood and sleep disturbance. Negative for hallucinations, self-injury and suicidal ideas. The patient is not hyperactive.        Objective:   Physical Exam  Constitutional: She is oriented to person, place, and time. She appears well-developed and well-nourished. No distress.  HENT:  Head: Normocephalic and atraumatic.  Right Ear: Tympanic membrane, external ear and ear canal normal.  Left Ear: Tympanic membrane, external ear and ear canal normal.  Nose: Nose normal. No mucosal edema or rhinorrhea.  Mouth/Throat: Uvula is midline, oropharynx is clear and moist and mucous membranes are normal. No oropharyngeal exudate.  Eyes: Conjunctivae are normal. Right eye exhibits no discharge. Left eye exhibits no discharge. No scleral icterus.  Neck: Normal range of motion. Neck supple. No thyromegaly present.  Cardiovascular: Normal rate, regular rhythm, normal heart sounds and intact distal pulses.   Pulmonary/Chest: Effort normal and breath sounds normal. No respiratory distress.  Abdominal: Soft. Bowel sounds are normal. She exhibits no distension and no mass. There is  no tenderness. There is no rebound and no guarding.  Musculoskeletal: She exhibits no edema.  Lymphadenopathy:    She has no cervical adenopathy.  Neurological: She is alert and oriented to person, place, and time.  Skin: Skin is warm and dry. She is not diaphoretic. No erythema.  Psychiatric: She has a normal mood and affect. Her behavior is normal.    BP 110/70 (BP Location: Right Arm, Patient Position: Sitting, Cuff Size: Normal)   Pulse 91   Temp 97.6 F (36.4 C) (Oral)   Resp 16   Wt 108 lb 3.2 oz (49.1 kg)   SpO2 94%   BMI 19.17 kg/m      Assessment & Plan:   1. Generalized abdominal pain   2. Chronic pain syndrome - recheck in 2 wks for refill of her chronic hydrocodone.  Gets in 1 wk increments at a time  - has a month worth of rxs on-file waiting [facility] that she can fill every Sat  3. Chronic obstructive pulmonary disease, unspecified COPD type ([place])   4. Tobacco abuse -  Became severely depressed after just a few d of chantix - I do not think it is safe for her to retry this, start nicotine patches and/or gum.  Trouble-shooted mult ways of weaning down.  Recheck in 2 wks   5. Major depressive disorder, recurrent episode, moderate ([place]) - maxed out - mood now improving off but pt still c/o depression, however, I do not think she is being compliant with her rx'd meds esp the zyprexa, Had a very difficult time when we changed her antipressent last yr with mult episodes of SI (though never a plan) so I am very reluctant to try changing again as she is doing SO much better  6. Insomnia - weaning down on alprazolam since she is on opiates, use trazodone instead    Over 15 min spent in face-to-face evaluation of and consultation with patient and coordination of care.  Over 50% of this time was spent counseling this patient.   [name], M.D.  [facility] [address]  [place], [place] [address] [phone] phone [phone] fax  02/24/16 10:26 PM

## 2016-02-25 NOTE — Progress Notes (Signed)
Subjective:    Patient ID: [name], female    DOB: 10/28/1950, 65 y.o.   MRN: [id number] By signing my name below, I, [name], attest that this documentation has been prepared under the direction and in the presence of [name], MD. Electronically Signed: Judithe [name], ER Scribe. 02/10/2016. 11:23 AM.  Chief Complaint  Patient presents with  . Fever  . GI Problem  . Insomnia   Fever   Associated symptoms include abdominal pain, coughing and nausea. Pertinent negatives include no diarrhea, vomiting or wheezing.  GI Problem  The primary symptoms include fever, abdominal pain, nausea and myalgias. Primary symptoms do not include vomiting or diarrhea.  The illness does not include constipation.  Insomnia  Primary symptoms: sleep disturbance.    HPI Comments: [name] is a 65 y.o. female who presents to [facility] complaining of myalgias, fever, nausea, reduced appetite, fatigue and trouble sleeping for the last few days. Her BM and urination have been normal. She state she has not gotten out of bed much in the last few days because she is feeling sick. She is having trouble sleeping because she has been out of xanax. She used all of her trazadone.  She also mentioned that her mother has completely lost her memory and has had several falls lately.   Past Medical History:  Diagnosis Date  . ACUTE DUODEN ULCER W/HEMORR PERF&OBSTRUCTION 06/26/2004   Qualifier: Diagnosis of  By: [name] MD, [name]   . Acute duodenal ulcer with hemorrhage and perforation, with obstruction ([place])   . Allergy   . Anemia   . Anginal pain ([place])    "many many years ago"  . Anxiety    sees [name] NP at Dr. Radonna Ricker office  . Arthritis    "hips" (09/06/2014)  . Asthma   . Barrett's esophagus   . Chronic abdominal pain    narcotic dependence, dr gy[facility]eng-dak at [facility]  . 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 Complication of anesthesia    pt  states had too much xanax on board - agitated   . COPD (chronic obstructive pulmonary disease) ([place])   . Depression   . Exertional shortness of breath   . FEVER, RECURRENT 08/11/2009   Qualifier: Diagnosis of  By: [name] MD, [name]   . Fx of fibula 07-28-11   left fibula, 2 places   . Fx two ribs-open 08-24-11   left 4th and 5th  . GASTRIC OUTLET OBSTRUCTION 08/28/2007   In July 2008 she underwent laparoscopic enterolysis, Nissen fundoplication over a [id number] bougie, single pledgeted suture colosure of the hiatus and a 3 suture wrap.  Lap truncal vagotomy and a loop gastrojejunostomy was performed.  This was revised in August of 2009 to a roux en Y gastrojejujostomy.     . Gastroparesis 06/03/2007   Qualifier: Diagnosis of  By: [name] MD, [name]   . GERD (gastroesophageal reflux disease)   . Headache    "monthly" (09/06/2014)  . History of blood transfusion    "related to OR; maybe when I perforated that ulcer"  . History of hiatal hernia   . Lap Nissen + truncal vagotomy July 2008 05/13/2013  . LEG EDEMA 01/19/2008   Qualifier: Diagnosis of  By: [name] MD, [name]   . Menopause   . MENOPAUSE 01/07/2007   Qualifier: Diagnosis of  By: [name], CMA, [place]    . Myocardial infarction ([place])   .  Narcotic abuse   . Peptic ulcer disease    dr Oletta Lamas  . Pneumonia, organism unspecified 11/07/2010   Assoc with R Parapneumonic effusion 09/2010    - Tapped 10/05/10    - CxR resolved 11/07/2010    . S/P jejunostomy Feb 2016 07/26/2014   Allergies  Allergen Reactions  . Lithium Nausea And Vomiting  . Celebrex [Celecoxib] Other (See Comments)    History of bleeding ulcers  . Morphine Itching  . Quetiapine Other (See Comments)     tardive dyskinesia  . Varenicline Tartrate Other (See Comments)    hallucinations   Current Outpatient Prescriptions on File Prior to Visit  Medication Sig Dispense Refill  . albuterol (PROVENTIL HFA;VENTOLIN HFA) 108 (90 Base) MCG/ACT inhaler Inhale 2 puffs into the lungs every 4  (four) hours as needed (cough, shortness of breath or wheezing.). 1 Inhaler 3  . ALPRAZolam (XANAX) 1 MG tablet Take 1 tablet (1 mg total) by mouth 2 (two) times daily as needed for anxiety. This is a 1 week supply. May fill Saturday 8/19 9 tablet 0  . ALPRAZolam (XANAX) 1 MG tablet Take 1 tablet (1 mg total) by mouth 2 (two) times daily as needed for anxiety. This is a 1 week supply. May fill on or after 02/17/2016 9 tablet 0  . ALPRAZolam (XANAX) 1 MG tablet Take 1 tablet (1 mg total) by mouth 2 (two) times daily as needed for anxiety. This is a 1 week supply. May fill on or after 02/24/2016 9 tablet 0  . ALPRAZolam (XANAX) 1 MG tablet Take 1 tablet (1 mg total) by mouth 2 (two) times daily as needed for anxiety. This is a 1 week supply. May fill on or after 03/02/2016 9 tablet 0  . DULoxetine (CYMBALTA) 60 MG capsule Take 1 capsule (60 mg total) by mouth daily. 30 capsule 1  . feeding supplement (BOOST / RESOURCE BREEZE) LIQD Take 1 Container by mouth 3 (three) times daily between meals. 90 Container 11  . HYDROcodone-acetaminophen (NOR[place]) 10-325 MG tablet Take 1-2 tablets by mouth every 6 (six) hours as needed. This is a 1 week supply. Ok to fill Saturday 02/10/16 60 tablet 0  . HYDROcodone-acetaminophen (NORCO) 10-325 MG tablet Take 1-2 tablets by mouth every 6 (six) hours as needed for moderate pain. This is a 1 week supply. Ok to fill Saturday 02/17/16 60 tablet 0  . HYDROcodone-acetaminophen (NORCO) 10-325 MG tablet Take 1-2 tablets by mouth every 6 (six) hours as needed. This is a 1 week supply. Ok to fill Saturday 02/24/16 60 tablet 0  . HYDROcodone-acetaminophen (NORCO) 10-325 MG tablet Take 1-2 tablets by mouth every 6 (six) hours as needed. This is a 1 week supply. Ok to fill Saturday 03/02/16 60 tablet 0  . ipratropium (ATROVENT) 0.03 % nasal spray Place 2 sprays into both nostrils 2 (two) times daily. 30 mL 0  . ipratropium-albuterol (DUONEB) 0.5-2.5 (3) MG/3ML SOLN Take 3 mLs by  nebulization 4 (four) times daily -  before meals and at bedtime. 360 mL 3  . nicotine (NICODERM CQ - DOSED IN MG/24 HOURS) 21 mg/24hr patch Place 1-2 patches (21-42 mg total) onto the skin daily. 56 patch 0  . OLANZapine (ZYPREXA) 10 MG tablet Take 1 tablet (10 mg total) by mouth at bedtime. 30 tablet 5  . omeprazole (PRILOSEC) 40 MG capsule TAKE ONE CAPSULE BY MOUTH TWICE DAILY 180 capsule 3  . UNABLE TO FIND Place 1 application into the nose once. Med Name: best  Fit evaluation for oxygen, possibly simply go mini 1 Device 0  . UNABLE TO FIND Med Name: Best fit evaluation for oxygen - possible simply go mini 1 Device 0   No current facility-administered medications on file prior to visit.    Depression screen [facility] 2/9 02/05/2016 02/03/2016 01/24/2016 01/19/2016 01/13/2016  Decreased Interest 0 2 0 0 3  Down, Depressed, Hopeless 0 2 2 0 3  PHQ - 2 Score 0 4 2 0 6  Altered sleeping - 2 0 - 3  Tired, decreased energy - 2 3 - 0  Change in appetite - 2 0 - 1  Feeling bad or failure about yourself  - 2 3 - 2  Trouble concentrating - 2 0 - 3  Moving slowly or fidgety/restless - 2 0 - 3  Suicidal thoughts - 0 0 - 3  PHQ-9 Score - 16 8 - 21  Difficult doing work/chores - Somewhat difficult Not difficult at all - Extremely dIfficult  Some recent data might be hidden    Review of Systems  Constitutional: Positive for appetite change and fever. Negative for activity change.  Respiratory: Positive for cough and chest tightness. Negative for shortness of breath and wheezing.   Gastrointestinal: Positive for abdominal pain and nausea. Negative for abdominal distention, anal bleeding, blood in stool, constipation, diarrhea and vomiting.  Musculoskeletal: Positive for myalgias.  Psychiatric/Behavioral: Positive for agitation, behavioral problems, confusion, decreased concentration and sleep disturbance. Negative for dysphoric mood. The patient has insomnia.        Objective:   Physical Exam    Constitutional: She is oriented to person, place, and time. She appears well-developed and well-nourished. No distress.  HENT:  Head: Normocephalic and atraumatic.  Eyes: Pupils are equal, round, and reactive to light.  Neck: Neck supple.  Cardiovascular: Normal rate.   Murmur heard. 2/6 systolic murmur RUSB.  Pulmonary/Chest: Effort normal. No respiratory distress.  Lungs clear, good air movement.  Abdominal:  Hyperactive tympanitic bowel sounds.   Musculoskeletal: Normal range of motion.  Neurological: She is alert and oriented to person, place, and time. Coordination normal.  Skin: Skin is warm and dry. She is not diaphoretic.  Psychiatric: She has a normal mood and affect. Her behavior is normal.  Nursing note and vitals reviewed.  BP 116/74 (BP Location: Right Arm, Patient Position: Sitting, Cuff Size: Small)   Pulse 86   Temp 99.9 F (37.7 C) (Oral)   Resp 18   Ht 5\' 2"  (1.575 m)   Wt 103 lb 12.8 oz (47.1 kg)   SpO2 94%   BMI 18.99 kg/m      Assessment & Plan:   1. Generalized abdominal pain   Pt did get sig sxs improvement after in office GI cocktail - she used to keep this at home for prn use so will try again. ADDENDUM: pharmacist called  - GI cocktal costs > 600 - recommends pt just use maalox and levsin or bentyl.  Meds ordered this encounter  Medications  . traZODone (DESYREL) 50 MG tablet    Sig: Take 1-2 tablets (50-100 mg total) by mouth at bedtime as needed for sleep.    Dispense:  60 tablet    Refill:  3  . ondansetron (ZOFRAN ODT) 4 MG disintegrating tablet    Sig: Take 1-2 tablets (4-8 mg total) by mouth every 8 (eight) hours as needed for nausea or vomiting.    Dispense:  180 tablet    Refill:  0  . gi cocktail (  Maalox,Lidocaine,Donnatal)  . DISCONTD: Alum & Mag Hydroxide-Simeth (GI COCKTAIL) SUSP suspension    Sig: Take 30 mLs by mouth 2 (two) times daily as needed for indigestion. Shake well.    Dispense:  1200 mL    Refill:  0    Please make  a 2:2:1 mixture of Maalox, Viscous Lidocaine, and donnatol    I personally performed the services described in this documentation, which was scribed in my presence. The recorded information has been reviewed and considered, and addended by me as needed.   [name], M.D.  [facility] [address] [place], [place] [address] [phone] phone [phone] fax  02/25/16 9:58 PM

## 2016-02-28 DIAGNOSIS — H353134 Nonexudative age-related macular degeneration, bilateral, advanced atrophic with subfoveal involvement: Secondary | ICD-10-CM | POA: Diagnosis not present

## 2016-02-28 DIAGNOSIS — H26491 Other secondary cataract, right eye: Secondary | ICD-10-CM | POA: Diagnosis not present

## 2016-02-28 DIAGNOSIS — Z961 Presence of intraocular lens: Secondary | ICD-10-CM | POA: Diagnosis not present

## 2016-02-29 DIAGNOSIS — J449 Chronic obstructive pulmonary disease, unspecified: Secondary | ICD-10-CM | POA: Diagnosis not present

## 2016-03-02 ENCOUNTER — Ambulatory Visit (INDEPENDENT_AMBULATORY_CARE_PROVIDER_SITE_OTHER): Payer: Medicare Other | Admitting: Family Medicine

## 2016-03-02 VITALS — BP 116/70 | HR 74 | Temp 98.7°F | Resp 18 | Ht 62.0 in | Wt 103.8 lb

## 2016-03-02 DIAGNOSIS — Z23 Encounter for immunization: Secondary | ICD-10-CM

## 2016-03-02 DIAGNOSIS — G894 Chronic pain syndrome: Secondary | ICD-10-CM | POA: Diagnosis not present

## 2016-03-02 DIAGNOSIS — F4323 Adjustment disorder with mixed anxiety and depressed mood: Secondary | ICD-10-CM | POA: Diagnosis not present

## 2016-03-02 MED ORDER — SUVOREXANT 10 MG PO TABS: 10.0000 mg | ORAL_TABLET | Freq: Every evening | ORAL | 0 refills | Status: DC | PRN

## 2016-03-02 MED ORDER — DULOXETINE HCL 60 MG PO CPEP: 60.0000 mg | ORAL_CAPSULE | Freq: Every day | ORAL | 2 refills | Status: DC

## 2016-03-02 MED ORDER — HYDROCODONE-ACETAMINOPHEN 10-325 MG PO TABS: 1.0000 | ORAL_TABLET | Freq: Four times a day (QID) | ORAL | 0 refills | Status: DC | PRN

## 2016-03-02 MED ORDER — ALPRAZOLAM 1 MG PO TABS: 1.0000 mg | ORAL_TABLET | Freq: Two times a day (BID) | ORAL | 0 refills | Status: DC | PRN

## 2016-03-02 NOTE — Patient Instructions (Signed)
     IF you received an x-ray today, you will receive an invoice from [facility]. Please contact [facility] at [phone] with questions or concerns regarding your invoice.   IF you received labwork today, you will receive an invoice from [facility]. Please contact [facility] at [phone] with questions or concerns regarding your invoice.   Our billing staff will not be able to assist you with questions regarding bills from these companies.  You will be contacted with the lab results as soon as they are available. The fastest way to get your results is to activate your My Chart account. Instructions are located on the last page of this paperwork. If you have not heard from us regarding the results in 2 weeks, please contact this office.      

## 2016-03-02 NOTE — Progress Notes (Signed)
Subjective:  By signing my name below, I, [name], attest that this documentation has been prepared under the direction and in the presence of [name] Cheadle, MD.  Electronically Signed: Thea [name], ED Scribe. 03/02/2016. 11:35 AM.   Patient ID: [name], female    DOB: 10-28-1950, 65 y.o.   MRN: [id number]  HPI Chief Complaint  Patient presents with  . Follow-up    HPI Comments: [name] is a 65 y.o. female who presents to the [facility] for a follow up.   Pt states her mood has been okay but would like something for sleep. Pt states trazodone does not help with sleep. Her appetite has increased. She eats about 5 [name] meals a day  Pt has cut back to smoking about a half a pack a day. States her breathing has been doing well. She is not O2 at home.  She denies coughing.   Patient Active Problem List   Diagnosis Date Noted  . Abdominal pain   . Hypoxia 04/08/2015  . Opioid use with withdrawal ([place])   . Protein-calorie malnutrition ([place])   . Episode of recurrent major depressive disorder ([place])   . Secondary hyperparathyroidism ([place]) 01/10/2015  . Vitamin D deficiency 01/10/2015  . 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 Anemia, iron deficiency 11/03/2014  . Chronic pain syndrome 10/24/2014  . Osteoporosis 10/05/2014  . Vertebral compression fracture ([place]) 10/05/2014  . Coronary artery disease due to calcified coronary lesion 10/05/2014  . Weight loss   . Tobacco abuse   . History of subtotal gastrectomy with Roux-en-Y recontruction   . Orthostatic hypotension 08/11/2014  . Severe protein-calorie malnutrition ([place]) 12/30/2013  . Constipation, chronic 07/10/2013  . Chronic abdominal pain   . COPD (chronic obstructive pulmonary disease) ([place]) 11/07/2010  . Major depressive disorder, recurrent episode, moderate ([place]) 06/06/2010  . 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 Anxiety state 01/07/2007  . BARRETT'S ESOPHAGUS, HX OF 07/03/2005    Past Medical History:  Diagnosis Date  . ACUTE DUODEN ULCER W/HEMORR PERF&OBSTRUCTION 06/26/2004   Qualifier: Diagnosis of  By: [name] MD, [name]   . Acute duodenal ulcer with hemorrhage and perforation, with obstruction ([place])   . Allergy   . Anemia   . Anginal pain ([place])    "many many years ago"  . Anxiety    sees [name] NP at Dr. Radonna Ricker office  . Arthritis    "hips" (09/06/2014)  . Asthma   . Barrett's esophagus   . Chronic abdominal pain    narcotic dependence, dr gyarteng-dak at [facility]  . 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 Complication of anesthesia    pt states had too much xanax on board - agitated   . COPD (chronic obstructive pulmonary disease) ([place])   . Depression   . Exertional shortness of breath   . FEVER, RECURRENT 08/11/2009   Qualifier: Diagnosis of  By: [name] MD, [name]   . Fx of fibula 07-28-11   left fibula, 2 places   . Fx two ribs-open 08-24-11   left 4th and 5th  . GASTRIC OUTLET OBSTRUCTION 08/28/2007   In July 2008 she underwent laparoscopic enterolysis, Nissen fundoplication over a [id number] bougie, single pledgeted suture colosure of the hiatus and a 3 suture wrap.  Lap truncal vagotomy and a loop gastrojejunostomy was performed.  This was revised in August of 2009 to a roux en Y gastrojejujostomy.     Marland Kitchen  Gastroparesis 06/03/2007   Qualifier: Diagnosis of  By: [name] MD, [name]   . GERD (gastroesophageal reflux disease)   . Headache    "monthly" (09/06/2014)  . History of blood transfusion    "related to OR; maybe when I perforated that ulcer"  . History of hiatal hernia   . Lap Nissen + truncal vagotomy July 2008 05/13/2013  . LEG EDEMA 01/19/2008   Qualifier: Diagnosis of  By: [name] MD, [name]   . Menopause   . MENOPAUSE 01/07/2007   Qualifier: Diagnosis of  By: [name], CMA, [place]    . Myocardial infarction ([place])   . Narcotic abuse   . Peptic ulcer disease    dr Oletta Lamas  . Pneumonia, organism  unspecified 11/07/2010   Assoc with R Parapneumonic effusion 09/2010    - Tapped 10/05/10    - CxR resolved 11/07/2010    . S/P jejunostomy Feb 2016 07/26/2014   Past Surgical History:  Procedure Laterality Date  . BIOPSY THYROID  08-13-11   benign nodule, per Dr. Melida Quitter   . CATARACT EXTRACTION W/ INTRAOCULAR LENS  IMPLANT, BILATERAL Bilateral   . COLONOSCOPY    . DILATION AND CURETTAGE OF UTERUS    . ESOPHAGOGASTRODUODENOSCOPY  12-29-09   dr qadeer at [facility], several gastric ulcers  . ESOPHAGOGASTRODUODENOSCOPY N/A 09/25/2012   Procedure: ESOPHAGOGASTRODUODENOSCOPY (EGD);  Surgeon: Beryle Beams, MD;  Location: [facility];  Service: Endoscopy;  Laterality: N/A;  . FRACTURE SURGERY    . GASTROJEJUNOSTOMY  02-20-08   Roux en Y gastrojejunsotomy w 1 foot Roux limb  . GASTROSTOMY N/A 07/26/2014   Procedure: LAPRASCOPIC ASSISTED OPEN PLACEMENT OF JEJUNOSTOMY TUBE;  Surgeon: Pedro Earls, MD;  Location: [facility];  Service: General;  Laterality: N/A;  . HERNIA REPAIR     "hiatal"  . JOINT REPLACEMENT    . LAPAROSCOPIC NISSEN FUNDOPLICATION    . LAPAROSCOPIC PARTIAL GASTRECTOMY N/A 06/29/2013   Procedure: Gastrectomy and GJ Tube placement;  Surgeon: Pedro Earls, MD;  Location: [facility];  Service: General;  Laterality: N/A;  . ORIF HIP FRACTURE Bilateral    "pins in both of them"  . REPAIR OF PERFORATED ULCER    . TONSILLECTOMY     Allergies  Allergen Reactions  . Lithium Nausea And Vomiting  . Celebrex [Celecoxib] Other (See Comments)    History of bleeding ulcers  . Morphine Itching  . Quetiapine Other (See Comments)     tardive dyskinesia  . Varenicline Tartrate Other (See Comments)    hallucinations   Prior to Admission medications   Medication Sig Start Date End Date Taking? Authorizing Provider  albuterol (PROVENTIL HFA;VENTOLIN HFA) 108 (90 Base) MCG/ACT inhaler Inhale 2 puffs into the lungs every 4 (four) hours as needed (cough, shortness of breath or wheezing.). 08/01/15   Yes Shawnee Knapp, MD  ALPRAZolam [name]) 1 MG tablet Take 1 tablet (1 mg total) by mouth 2 (two) times daily as needed for anxiety. This is a 1 week supply. May fill Saturday 8/19 02/05/16  Yes Shawnee Knapp, MD  ALPRAZolam [name]) 1 MG tablet Take 1 tablet (1 mg total) by mouth 2 (two) times daily as needed for anxiety. This is a 1 week supply. May fill on or after 02/17/2016 02/05/16  Yes Shawnee Knapp, MD  ALPRAZolam [name]) 1 MG tablet Take 1 tablet (1 mg total) by mouth 2 (two) times daily as needed for anxiety. This is a 1 week supply. May fill on or  after 02/24/2016 02/05/16  Yes Shawnee Knapp, MD  ALPRAZolam [name]) 1 MG tablet Take 1 tablet (1 mg total) by mouth 2 (two) times daily as needed for anxiety. This is a 1 week supply. May fill on or after 03/02/2016 02/05/16  Yes Shawnee Knapp, MD  Alum & Mag Hydroxide-Simeth (GI COCKTAIL) SUSP suspension Take 30 mLs by mouth 3 (three) times daily as needed for indigestion. Shake well. 02/15/16  Yes Shawnee Knapp, MD  DULoxetine (CYMBALTA) 60 MG capsule Take 1 capsule (60 mg total) by mouth daily. 10/28/15  Yes Shawnee Knapp, MD  feeding supplement (BOOST / RESOURCE BREEZE) LIQD Take 1 Container by mouth 3 (three) times daily between meals. 12/16/15  Yes Shawnee Knapp, MD  HYDROcodone-acetaminophen [facility]) 10-325 MG tablet Take 1-2 tablets by mouth every 6 (six) hours as needed. This is a 1 week supply. Ok to fill Saturday 02/10/16 02/05/16  Yes Shawnee Knapp, MD  HYDROcodone-acetaminophen [facility]) 10-325 MG tablet Take 1-2 tablets by mouth every 6 (six) hours as needed for moderate pain. This is a 1 week supply. Ok to fill Saturday 02/17/16 02/05/16  Yes Shawnee Knapp, MD  HYDROcodone-acetaminophen [facility]) 10-325 MG tablet Take 1-2 tablets by mouth every 6 (six) hours as needed. This is a 1 week supply. Ok to fill Saturday 02/24/16 02/05/16  Yes Shawnee Knapp, MD  HYDROcodone-acetaminophen [facility]) 10-325 MG tablet Take 1-2 tablets by mouth every 6 (six) hours as needed. This is a 1 week supply.  Ok to fill Saturday 03/02/16 02/05/16  Yes Shawnee Knapp, MD  ipratropium (ATROVENT) 0.03 % nasal spray Place 2 sprays into both nostrils 2 (two) times daily. 10/20/15  Yes Posey Boyer, MD  ipratropium-albuterol (DUONEB) 0.5-2.5 (3) MG/3ML SOLN Take 3 mLs by nebulization 4 (four) times daily -  before meals and at bedtime. 06/03/15  Yes Robyn Haber, MD  nicotine (NICODERM CQ - DOSED IN MG/24 HOURS) 21 mg/24hr patch Place 1-2 patches (21-42 mg total) onto the skin daily. 01/13/16  Yes Shawnee Knapp, MD  OLANZapine (ZYPREXA) 10 MG tablet Take 1 tablet (10 mg total) by mouth at bedtime. 10/28/15  Yes Shawnee Knapp, MD  omeprazole (PRILOSEC) 40 MG capsule TAKE ONE CAPSULE BY MOUTH TWICE DAILY 10/10/15  Yes Shawnee Knapp, MD  ondansetron (ZOFRAN ODT) 4 MG disintegrating tablet Take 1-2 tablets (4-8 mg total) by mouth every 8 (eight) hours as needed for nausea or vomiting. 02/10/16  Yes Shawnee Knapp, MD  traZODone (DESYREL) 50 MG tablet Take 1-2 tablets (50-100 mg total) by mouth at bedtime as needed for sleep. 02/10/16  Yes Shawnee Knapp, MD  UNABLE TO FIND Place 1 application into the nose once. Med Name: best Fit evaluation for oxygen, possibly simply go mini 08/18/15  Yes Mancel Bale, PA-C  UNABLE TO FIND Med Name: Best fit evaluation for oxygen - possible simply go mini 08/18/15  Yes Mancel Bale, PA-C   Social History   Social History  . Marital status: Divorced    Spouse name: N/A  . Number of children: N/A  . Years of education: N/A   Occupational History  . Not on file.   Social History Main Topics  . Smoking status: Current Every Day Smoker    Packs/day: 2.00    Years: 50.00    Types: Cigarettes  . Smokeless tobacco: Never Used  . Alcohol use No  . Drug use: No  . Sexual activity: No  Other Topics Concern  . Not on file   Social History Narrative  . No narrative on file   Review of Systems  Constitutional: Positive for fatigue. Negative for activity change, appetite change and unexpected  weight change.  Respiratory: Positive for cough. Negative for chest tightness, shortness of breath and wheezing.   Cardiovascular: Negative for chest pain and leg swelling.  Gastrointestinal: Positive for abdominal pain. Negative for abdominal distention and vomiting.  Musculoskeletal: Positive for arthralgias, back pain and myalgias. Negative for gait problem and joint swelling.  Neurological: Positive for weakness. Negative for seizures and syncope.  Psychiatric/Behavioral: Positive for agitation, behavioral problems, confusion, decreased concentration, dysphoric mood and sleep disturbance. The patient is nervous/anxious.        Objective:   Physical Exam  Constitutional: She is oriented to person, place, and time. She appears well-developed and well-nourished. No distress.  HENT:  Head: Normocephalic and atraumatic.  Eyes: Conjunctivae and EOM are normal.  Neck: Neck supple.  Cardiovascular: Normal rate.   Pulmonary/Chest: Effort normal.  Musculoskeletal: Normal range of motion.  Neurological: She is alert and oriented to person, place, and time.  Skin: Skin is warm and dry.  Psychiatric: She has a normal mood and affect. Her behavior is normal.  Nursing note and vitals reviewed.    Vitals:   03/02/16 1116  BP: 116/70  Pulse: 74  Resp: 18  Temp: 98.7 F (37.1 C)  TempSrc: Oral  SpO2: 95%  Weight: 103 lb 12.8 oz (47.1 kg)  Height: 5\' 2"  (1.575 m)   Assessment & Plan:   1. Flu vaccine need   2. Chronic pain syndrome - pt again requesting increase dose but reminded she is maxed out due to APAP limitation and failed all trials of mult other opiates - intolerant  3. Adjustment disorder with mixed anxiety and depressed mood - cont cymbalta, I don't think pt is taking her zyprexa despite her insistence otherwise since her bottle is several times fuller than it should be (as I will always consolidate the refills at her visit.)   4.      Insomnia - Cont xanax taper of decrease by  1 tab qwk every month - taper off bzd since on chronic narcotics. Pt c/o sig worsening insomnia unresponsive to trazodone so will try belsomra  F/u in 1 mo  Orders Placed This Encounter  Procedures  . Flu Vaccine QUAD 36+ mos IM    Meds ordered this encounter  Medications  . DULoxetine (CYMBALTA) 60 MG capsule    Sig: Take 1 capsule (60 mg total) by mouth daily.    Dispense:  30 capsule    Refill:  2  . Suvorexant (BELSOMRA) 10 MG TABS    Sig: Take 10 mg by mouth at bedtime as needed (sleep).    Dispense:  30 tablet    Refill:  0  . HYDROcodone-acetaminophen (NORCO) 10-325 MG tablet    Sig: Take 1-2 tablets by mouth every 6 (six) hours as needed. This is a 1 week supply. Ok to fill Saturday 03/09/16    Dispense:  60 tablet    Refill:  0  . HYDROcodone-acetaminophen (NORCO) 10-325 MG tablet    Sig: Take 1-2 tablets by mouth every 6 (six) hours as needed for moderate pain. This is a 1 week supply. Ok to fill Saturday 03/16/16    Dispense:  60 tablet    Refill:  0  . HYDROcodone-acetaminophen (NORCO) 10-325 MG tablet    Sig: Take 1-2 tablets  by mouth every 6 (six) hours as needed. This is a 1 week supply. Ok to fill Saturday 03/23/16    Dispense:  60 tablet    Refill:  0  . HYDROcodone-acetaminophen (NORCO) 10-325 MG tablet    Sig: Take 1-2 tablets by mouth every 6 (six) hours as needed. This is a 1 week supply. Ok to fill Saturday 03/30/16    Dispense:  60 tablet    Refill:  0  . ALPRAZolam (XANAX) 1 MG tablet    Sig: Take 1 tablet (1 mg total) by mouth 2 (two) times daily as needed for anxiety. This is a 1 week supply. May fill Saturday 9/16    Dispense:  8 tablet    Refill:  0  . ALPRAZolam (XANAX) 1 MG tablet    Sig: Take 1 tablet (1 mg total) by mouth 2 (two) times daily as needed for anxiety. This is a 1 week supply. May fill on or after 03/16/2016    Dispense:  8 tablet    Refill:  0  . ALPRAZolam (XANAX) 1 MG tablet    Sig: Take 1 tablet (1 mg total) by mouth 2 (two)  times daily as needed for anxiety. This is a 1 week supply. May fill on or after 03/23/2016    Dispense:  8 tablet    Refill:  0  . ALPRAZolam (XANAX) 1 MG tablet    Sig: Take 1 tablet (1 mg total) by mouth 2 (two) times daily as needed for anxiety. This is a 1 week supply. May fill on or after 03/30/2016    Dispense:  8 tablet    Refill:  0    I personally performed the services described in this documentation, which was scribed in my presence. The recorded information has been reviewed and considered, and addended by me as needed.   [name], M.D.  [facility] [address] [place], [place] [address] [phone] phone [phone] fax  03/04/16 11:40 PM

## 2016-03-08 ENCOUNTER — Telehealth: Payer: Self-pay

## 2016-03-08 NOTE — Telephone Encounter (Signed)
Called pt and advised that Dr Brigitte Pulse has the fill dates on these Rxs because she doesn't want them filled any earlier and asked pt why she wants them filled early. Pt stated that she "is hurting really bad and took her last one last night". She "guessed she messed up". I voiced my concern for her pain but that it is very dangerous to take too much of this medication and she needs to be very careful she does not take more than Rxd. Pt voiced understanding and stated she won't die and she will just wait until tomorrow. Dr Brigitte Pulse, [place]

## 2016-03-08 NOTE — Telephone Encounter (Signed)
PATIENT WOULD LIKE TO ASK DR. SHAW IF SHE WILL REFILL HER HYDROCODONE 10-325 MG AND XANAX 1 MG TODAY. HER ROOMMATE TRIED TO GET IT FILLED TODAY AT [facility] AND THEY TOLD HER SHE COULD NOT GET IT FILLED UNTIL Saturday 03/09/2016 UNLESS DR. SHAW OKed it. BEST PHONE [phone] (CELL)  [place]

## 2016-03-19 ENCOUNTER — Ambulatory Visit (INDEPENDENT_AMBULATORY_CARE_PROVIDER_SITE_OTHER): Payer: Medicare Other | Admitting: Family Medicine

## 2016-03-19 VITALS — BP 100/66 | HR 95 | Temp 97.9°F | Resp 16 | Ht 63.5 in | Wt 105.2 lb

## 2016-03-19 DIAGNOSIS — F411 Generalized anxiety disorder: Secondary | ICD-10-CM

## 2016-03-19 DIAGNOSIS — G4700 Insomnia, unspecified: Secondary | ICD-10-CM

## 2016-03-19 DIAGNOSIS — Z79899 Other long term (current) drug therapy: Secondary | ICD-10-CM

## 2016-03-19 DIAGNOSIS — G894 Chronic pain syndrome: Secondary | ICD-10-CM | POA: Diagnosis not present

## 2016-03-19 MED ORDER — ZALEPLON 10 MG PO CAPS
10.0000 mg | ORAL_CAPSULE | Freq: Every evening | ORAL | 0 refills | Status: DC | PRN
Start: 2016-03-19 — End: 2016-04-12

## 2016-03-19 NOTE — Patient Instructions (Signed)
     IF you received an x-ray today, you will receive an invoice from [facility]. Please contact [facility] at [phone] with questions or concerns regarding your invoice.   IF you received labwork today, you will receive an invoice from [facility]. Please contact [facility] at [phone] with questions or concerns regarding your invoice.   Our billing staff will not be able to assist you with questions regarding bills from these companies.  You will be contacted with the lab results as soon as they are available. The fastest way to get your results is to activate your My Chart account. Instructions are located on the last page of this paperwork. If you have not heard from us regarding the results in 2 weeks, please contact this office.      

## 2016-03-19 NOTE — Progress Notes (Signed)
By signing my name below, I, [name], attest that this documentation has been prepared under the direction and in the presence of [name], MD.  Electronically Signed: [name], Medical Scribe. 03/19/16. 11:20 AM.  Subjective:    Patient ID: [name], female    DOB: [birth year], 65 y.o.   MRN: [id number]  HPI Chief Complaint  Patient presents with  . Follow-up    med check, trouble sleeping    HPI Comments: [name] is a 65 y.o. female who presents to the [facility] complaining of medication recheck. Pt states Belsomra isn't working for her anymore and they didn't do anything for her. Pt is sleeping at 2-3am and pt would walk the halls before going to sleep. Pt states she gave the Rx to someone who doesn't takes pills, took one and they were up all night too. Pt still takes Olanzapine. Pt states she's eating well and she still smokes a pack a day. Pt states her mom who is 38 y/o was turned over to hospice. Pt's mom has dementia, and her mom doesn't want to do anything and wouldn't recognize her if pt didn't say hello. Pt states when she passes she'll be in pieces and she would like to get Xanax to help her cope with her passing. Pt states she doesn't do anything or move all day and when it's time to go to sleep she's up when everyone is sleep. Pt went out for the first time in a long time [place] to [place] dogs. Pt states she's scared the hospice is going to tell her her mom passed when the phone rings. Pt states her mom looks "really bad" since she's 80 lbs at 5'6".  Wt Readings from Last 3 Encounters:  03/19/16 105 lb 3.2 oz (47.7 kg)  03/02/16 103 lb 12.8 oz (47.1 kg)  02/10/16 103 lb 12.8 oz (47.1 kg)   Depression screen [facility] 2/9 03/19/2016 03/02/2016 02/05/2016 02/03/2016 01/24/2016  Decreased Interest 0 0 0 2 0  Down, Depressed, Hopeless 0 0 0 2 2  PHQ - 2 Score 0 0 0 4 2  Altered sleeping - - - 2 0  Tired, decreased energy -  - - 2 3  Change in appetite - - - 2 0  Feeling bad or failure about yourself  - - - 2 3  Trouble concentrating - - - 2 0  Moving slowly or fidgety/restless - - - 2 0  Suicidal thoughts - - - 0 0  PHQ-9 Score - - - 16 8  Difficult doing work/chores - - - Somewhat difficult Not difficult at all  Some recent data might be hidden   Vision: Pt still drives but suspects she won't be in the next 3 months due to her diminishing vision.  Patient Active Problem List   Diagnosis Date Noted  . Abdominal pain   . Hypoxia 04/08/2015  . Opioid use with withdrawal ([place])   . Protein-calorie malnutrition ([place])   . Episode of recurrent major depressive disorder ([place])   . Secondary hyperparathyroidism ([place]) 01/10/2015  . Vitamin D deficiency 01/10/2015  . 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 Anemia, iron deficiency 11/03/2014  . Chronic pain syndrome 10/24/2014  . Osteoporosis 10/05/2014  . Vertebral compression fracture ([place]) 10/05/2014  . Coronary artery disease due to calcified coronary lesion 10/05/2014  . Weight loss   . Tobacco abuse   . History of subtotal gastrectomy  with Roux-en-Y recontruction   . Orthostatic hypotension 08/11/2014  . Severe protein-calorie malnutrition ([place]) 12/30/2013  . Constipation, chronic 07/10/2013  . Chronic abdominal pain   . COPD (chronic obstructive pulmonary disease) ([place]) 11/07/2010  . Major depressive disorder, recurrent episode, moderate ([place]) 06/06/2010  . 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 Anxiety state 01/07/2007  . BARRETT'S ESOPHAGUS, HX OF 07/03/2005   Past Medical History:  Diagnosis Date  . ACUTE DUODEN ULCER W/HEMORR PERF&OBSTRUCTION 06/26/2004   Qualifier: Diagnosis of  By: [name] MD, [name]   . Acute duodenal ulcer with hemorrhage and perforation, with obstruction ([place])   . Allergy   . Anemia   . Anginal pain ([place])    "many many years ago"  . Anxiety    sees [name] NP at Dr. Radonna Ricker office  . Arthritis    "hips"  (09/06/2014)  . Asthma   . Barrett's esophagus   . Chronic abdominal pain    narcotic dependence, dr gyarteng-dak at [facility]  . 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 Complication of anesthesia    pt states had too much xanax on board - agitated   . COPD (chronic obstructive pulmonary disease) ([place])   . Depression   . Exertional shortness of breath   . FEVER, RECURRENT 08/11/2009   Qualifier: Diagnosis of  By: [name] MD, [name]   . Fx of fibula 07-28-11   left fibula, 2 places   . Fx two ribs-open 08-24-11   left 4th and 5th  . GASTRIC OUTLET OBSTRUCTION 08/28/2007   In July 2008 she underwent laparoscopic enterolysis, Nissen fundoplication over a [id number] bougie, single pledgeted suture colosure of the hiatus and a 3 suture wrap.  Lap truncal vagotomy and a loop gastrojejunostomy was performed.  This was revised in August of 2009 to a roux en Y gastrojejujostomy.     . Gastroparesis 06/03/2007   Qualifier: Diagnosis of  By: [name] MD, [name]   . GERD (gastroesophageal reflux disease)   . Headache    "monthly" (09/06/2014)  . History of blood transfusion    "related to OR; maybe when I perforated that ulcer"  . History of hiatal hernia   . Lap Nissen + truncal vagotomy July 2008 05/13/2013  . LEG EDEMA 01/19/2008   Qualifier: Diagnosis of  By: [name] MD, [name]   . Menopause   . MENOPAUSE 01/07/2007   Qualifier: Diagnosis of  By: [name], CMA, [place]    . Myocardial infarction ([place])   . Narcotic abuse   . Peptic ulcer disease    dr Oletta Lamas  . Pneumonia, organism unspecified 11/07/2010   Assoc with R Parapneumonic effusion 09/2010    - Tapped 10/05/10    - CxR resolved 11/07/2010    . S/P jejunostomy Feb 2016 07/26/2014   Past Surgical History:  Procedure Laterality Date  . BIOPSY THYROID  08-13-11   benign nodule, per Dr. Melida Quitter   . CATARACT EXTRACTION W/ INTRAOCULAR LENS  IMPLANT, BILATERAL Bilateral   . COLONOSCOPY    . DILATION AND CURETTAGE OF  UTERUS    . ESOPHAGOGASTRODUODENOSCOPY  12-29-09   dr qadeer at [facility], several gastric ulcers  . ESOPHAGOGASTRODUODENOSCOPY N/A 09/25/2012   Procedure: ESOPHAGOGASTRODUODENOSCOPY (EGD);  Surgeon: Beryle Beams, MD;  Location: [facility];  Service: Endoscopy;  Laterality: N/A;  . FRACTURE SURGERY    . GASTROJEJUNOSTOMY  02-20-08   Roux en Y gastrojejunsotomy w 1 foot Roux limb  . GASTROSTOMY N/A  07/26/2014   Procedure: LAPRASCOPIC ASSISTED OPEN PLACEMENT OF JEJUNOSTOMY TUBE;  Surgeon: Pedro Earls, MD;  Location: [facility];  Service: General;  Laterality: N/A;  . HERNIA REPAIR     "hiatal"  . JOINT REPLACEMENT    . LAPAROSCOPIC NISSEN FUNDOPLICATION    . LAPAROSCOPIC PARTIAL GASTRECTOMY N/A 06/29/2013   Procedure: Gastrectomy and GJ Tube placement;  Surgeon: Pedro Earls, MD;  Location: [facility];  Service: General;  Laterality: N/A;  . ORIF HIP FRACTURE Bilateral    "pins in both of them"  . REPAIR OF PERFORATED ULCER    . TONSILLECTOMY     Allergies  Allergen Reactions  . Lithium Nausea And Vomiting  . Celebrex [Celecoxib] Other (See Comments)    History of bleeding ulcers  . Morphine Itching  . Quetiapine Other (See Comments)     tardive dyskinesia  . Varenicline Tartrate Other (See Comments)    hallucinations   Prior to Admission medications   Medication Sig Start Date End Date Taking? Authorizing Provider  albuterol (PROVENTIL HFA;VENTOLIN HFA) 108 (90 Base) MCG/ACT inhaler Inhale 2 puffs into the lungs every 4 (four) hours as needed (cough, shortness of breath or wheezing.). 08/01/15  Yes Shawnee Knapp, MD  ALPRAZolam [name]) 1 MG tablet Take 1 tablet (1 mg total) by mouth 2 (two) times daily as needed for anxiety. This is a 1 week supply. May fill Saturday 9/16 03/02/16  Yes Shawnee Knapp, MD  ALPRAZolam [name]) 1 MG tablet Take 1 tablet (1 mg total) by mouth 2 (two) times daily as needed for anxiety. This is a 1 week supply. May fill on or after 03/16/2016 03/02/16  Yes Shawnee Knapp, MD    ALPRAZolam [name]) 1 MG tablet Take 1 tablet (1 mg total) by mouth 2 (two) times daily as needed for anxiety. This is a 1 week supply. May fill on or after 03/23/2016 03/02/16  Yes Shawnee Knapp, MD  ALPRAZolam [name]) 1 MG tablet Take 1 tablet (1 mg total) by mouth 2 (two) times daily as needed for anxiety. This is a 1 week supply. May fill on or after 03/30/2016 03/02/16  Yes Shawnee Knapp, MD  Alum & Mag Hydroxide-Simeth (GI COCKTAIL) SUSP suspension Take 30 mLs by mouth 3 (three) times daily as needed for indigestion. Shake well. 02/15/16  Yes Shawnee Knapp, MD  DULoxetine (CYMBALTA) 60 MG capsule Take 1 capsule (60 mg total) by mouth daily. 03/02/16  Yes Shawnee Knapp, MD  feeding supplement (BOOST / RESOURCE BREEZE) LIQD Take 1 Container by mouth 3 (three) times daily between meals. 12/16/15  Yes Shawnee Knapp, MD  HYDROcodone-acetaminophen [facility]) 10-325 MG tablet Take 1-2 tablets by mouth every 6 (six) hours as needed. This is a 1 week supply. Ok to fill Saturday 03/09/16 03/02/16  Yes Shawnee Knapp, MD  HYDROcodone-acetaminophen [facility]) 10-325 MG tablet Take 1-2 tablets by mouth every 6 (six) hours as needed for moderate pain. This is a 1 week supply. Ok to fill Saturday 03/16/16 03/02/16  Yes Shawnee Knapp, MD  HYDROcodone-acetaminophen [facility]) 10-325 MG tablet Take 1-2 tablets by mouth every 6 (six) hours as needed. This is a 1 week supply. Ok to fill Saturday 03/23/16 03/02/16  Yes Shawnee Knapp, MD  HYDROcodone-acetaminophen [facility]) 10-325 MG tablet Take 1-2 tablets by mouth every 6 (six) hours as needed. This is a 1 week supply. Ok to fill Saturday 03/30/16 03/02/16  Yes Shawnee Knapp, MD  ipratropium (  ATROVENT) 0.03 % nasal spray Place 2 sprays into both nostrils 2 (two) times daily. 10/20/15  Yes Posey Boyer, MD  ipratropium-albuterol (DUONEB) 0.5-2.5 (3) MG/3ML SOLN Take 3 mLs by nebulization 4 (four) times daily -  before meals and at bedtime. 06/03/15  Yes Robyn Haber, MD  nicotine (NICODERM CQ - DOSED IN MG/24 HOURS) 21  mg/24hr patch Place 1-2 patches (21-42 mg total) onto the skin daily. 01/13/16  Yes Shawnee Knapp, MD  OLANZapine (ZYPREXA) 10 MG tablet Take 1 tablet (10 mg total) by mouth at bedtime. 10/28/15  Yes Shawnee Knapp, MD  omeprazole (PRILOSEC) 40 MG capsule TAKE ONE CAPSULE BY MOUTH TWICE DAILY 10/10/15  Yes Shawnee Knapp, MD  ondansetron (ZOFRAN ODT) 4 MG disintegrating tablet Take 1-2 tablets (4-8 mg total) by mouth every 8 (eight) hours as needed for nausea or vomiting. 02/10/16  Yes Shawnee Knapp, MD  Suvorexant (BELSOMRA) 10 MG TABS Take 10 mg by mouth at bedtime as needed (sleep). 03/02/16  Yes Shawnee Knapp, MD  UNABLE TO FIND Place 1 application into the nose once. Med Name: best Fit evaluation for oxygen, possibly simply go mini 08/18/15  Yes Mancel Bale, PA-C  UNABLE TO FIND Med Name: Best fit evaluation for oxygen - possible simply go mini 08/18/15  Yes Mancel Bale, PA-C   Social History   Social History  . Marital status: Divorced    Spouse name: N/A  . Number of children: N/A  . Years of education: N/A   Occupational History  . Not on file.   Social History Main Topics  . Smoking status: Current Every Day Smoker    Packs/day: 2.00    Years: 50.00    Types: Cigarettes  . Smokeless tobacco: Never Used  . Alcohol use No  . Drug use: No  . Sexual activity: No   Other Topics Concern  . Not on file   Social History Narrative  . No narrative on file   Review of Systems  Constitutional: Positive for activity change. Negative for appetite change.  Respiratory: Negative for cough, chest tightness, shortness of breath and wheezing.   Cardiovascular: Negative for palpitations and leg swelling.  Gastrointestinal: Positive for abdominal pain and nausea. Negative for abdominal distention, anal bleeding, blood in stool, diarrhea and vomiting.  Musculoskeletal: Positive for arthralgias and back pain. Negative for joint swelling.  Psychiatric/Behavioral: Positive for agitation, behavioral problems,  confusion, decreased concentration, dysphoric mood and sleep disturbance. Negative for hallucinations. The patient is nervous/anxious. The patient is not hyperactive.    Objective:  Physical Exam  Constitutional: She appears well-developed and well-nourished. No distress.  HENT:  Head: Normocephalic and atraumatic.  Eyes: Conjunctivae are normal.  Neck: Neck supple.  Cardiovascular: Normal rate and regular rhythm.  Exam reveals no gallop and no friction rub.   No murmur heard. Pulmonary/Chest: Effort normal and breath sounds normal. No respiratory distress. She has no wheezes. She has no rales.  Neurological: She is alert.  Skin: Skin is warm and dry.  Psychiatric: She has a normal mood and affect. Her behavior is normal.  Nursing note and vitals reviewed.  BP 100/66 (BP Location: Right Arm, Patient Position: Sitting, Cuff Size: Normal)   Pulse 95   Temp 97.9 F (36.6 C) (Oral)   Resp 16   Ht 5' 3.5" (1.613 m)   Wt 105 lb 3.2 oz (47.7 kg)   SpO2 95%   BMI 18.34 kg/m  Assessment & Plan:  1. Chronic pain syndrome   2. Insomnia, unspecified type   3. Anxiety state   4. High risk medication use    I am weaning pt off of alprazolam since she is on chronic narcotics. Pt is again asking for increase today as she is having trouble sleeping - the belsomra did not help at all at the 10mg  and so she does not want to try the 20mg . Failed trazodone, elavil, melatonin, diphenhydramine.  Will try Sonata since it has a quicker onset and shorter half-life than some of the other sedative-hypnotics.  Pt has 2 more weeks of alprazolam and hydrocodone filed at her pharmacy. RTC in 2 wks for refills.  Meds ordered this encounter  Medications  . zaleplon (SONATA) 10 MG capsule    Sig: Take 1 capsule (10 mg total) by mouth at bedtime as needed for sleep.    Dispense:  30 capsule    Refill:  0    I personally performed the services described in this documentation, which was scribed in my  presence. The recorded information has been reviewed and considered, and addended by me as needed.   [name], M.D.  [facility] [address] [place], [place] [address] [phone] phone [phone] fax  03/24/16 11:40 PM

## 2016-03-21 DIAGNOSIS — J449 Chronic obstructive pulmonary disease, unspecified: Secondary | ICD-10-CM | POA: Diagnosis not present

## 2016-03-27 NOTE — Progress Notes (Signed)
Subjective:    Patient ID: [name], female    DOB: 1950/07/10, 65 y.o.   MRN: [id number] Chief Complaint  Patient presents with  . sleeping meds    "not working"    HPI  [name] is a 65 yo woman well known to me here for a routine follow-up on her chronic medical conditions.  She is having more trouble sleeping. The [place] did not help at all.  She has spend several days in bed sleeping and then she can't sleep at night. Some days she is physically hurting to much to get out of bed. She states if she took as much hydrocodone as she needed to get out of bed then she would run out to early.  She also tried zquil without any effects. Her mother has dementia and has just been enrolled in hospice and has stopped eating.  COPD with ongoing tobacco abuse: Tried chantix, patches, vapor, zyban to quit - failed all miserably.  Is on home oxygen.  Uses her O2 during the day when she is in her room and overnight. She does not take it when going about the house or leaving the house. She does not smoke in her bedroom where the oxygen is but does smoke at that other end of the house  CAD with h/o MI: lipids, no CP, no tightness  Chronic pain syndrome, largely from chronic abdominal and back pain: Can only tolerate hydrocodone. Could not tolerate oxycodone, fetanyl patch she had tried years prior without benefit, can tolerate dilaudid but it does not overall provide as good pain relief as hydrocodone.  H/o malnutrition and underweight:  Is gaining and doing great.  H/o GI ulcers and Barrett's esophagus: On PPI.  Chronic constipation: prn miralax. No problems  Secondary hyperparathyroidism:  Osteoporosis with h/o vertebral compression fractures with vitamin D Deficiency: start vitamin D  H/o IDA: Starting  A, C, and E supp.l  Depression and anxiety: Has mainly been on xanax but has tried valium prior. No other bzd in Epic. Has been on cymbalta for over a year. Was on Prozac 40 for years with  alright control - clinically her mood seemed about the same as now.  Failed remeron.  She did much worse on lithium briefly. I am not sure about her zyprexa compliance. Was on risperdal in 2016. Would like to increase her cymbalta if possible.  Insomnia:  I have never really been sure she is taking the zyprexa - she always has a ton left in her bottle and never sleeps better or gains weight as we increase it. At last visit we tried sonata. Has failed belsomra, trazodone, mirtazapine, ambien  HPV infection: Pap on 09/2014 showed Pap smear normal but high risk HPV test positive but NOT type 16 or 18.  No h/o abnml pap prior so will need repeat pap and HPV co-testing in 1 yr - 09/2015  Past Medical History:  Diagnosis Date  . ACUTE DUODEN ULCER W/HEMORR PERF&OBSTRUCTION 06/26/2004   Qualifier: Diagnosis of  By: [name] MD, [name]   . Acute duodenal ulcer with hemorrhage and perforation, with obstruction ([place])   . Allergy   . Anemia   . Anginal pain ([place])    "many many years ago"  . Anxiety    sees [name] NP at Dr. Radonna Ricker office  . Arthritis    "hips" (09/06/2014)  . Asthma   . Barrett's esophagus   . Chronic abdominal pain    narcotic dependence, dr gyarteng-dak  at [facility]  . 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 Complication of anesthesia    pt states had too much xanax on board - agitated   . COPD (chronic obstructive pulmonary disease) ([place])   . Depression   . Exertional shortness of breath   . FEVER, RECURRENT 08/11/2009   Qualifier: Diagnosis of  By: [name] MD, [name]   . Fx of fibula 07-28-11   left fibula, 2 places   . Fx two ribs-open 08-24-11   left 4th and 5th  . GASTRIC OUTLET OBSTRUCTION 08/28/2007   In July 2008 she underwent laparoscopic enterolysis, Nissen fundoplication over a [id number] bougie, single pledgeted suture colosure of the hiatus and a 3 suture wrap.  Lap truncal vagotomy and a loop gastrojejunostomy was performed.  This was  revised in August of 2009 to a roux en Y gastrojejujostomy.     . Gastroparesis 06/03/2007   Qualifier: Diagnosis of  By: [name] MD, [name]   . GERD (gastroesophageal reflux disease)   . Headache    "monthly" (09/06/2014)  . History of blood transfusion    "related to OR; maybe when I perforated that ulcer"  . History of hiatal hernia   . Lap Nissen + truncal vagotomy July 2008 05/13/2013  . LEG EDEMA 01/19/2008   Qualifier: Diagnosis of  By: [name] MD, [name]   . Menopause   . MENOPAUSE 01/07/2007   Qualifier: Diagnosis of  By: [name], CMA, [place]    . Myocardial infarction   . Narcotic abuse   . Peptic ulcer disease    dr Oletta Lamas  . Pneumonia, organism unspecified(486) 11/07/2010   Assoc with R Parapneumonic effusion 09/2010    - Tapped 10/05/10    - CxR resolved 11/07/2010    . S/P jejunostomy Feb 2016 07/26/2014   Past Surgical History:  Procedure Laterality Date  . BIOPSY THYROID  08-13-11   benign nodule, per Dr. Melida Quitter   . CATARACT EXTRACTION W/ INTRAOCULAR LENS  IMPLANT, BILATERAL Bilateral   . COLONOSCOPY    . DILATION AND CURETTAGE OF UTERUS    . ESOPHAGOGASTRODUODENOSCOPY  12-29-09   dr qadeer at [facility], several gastric ulcers  . ESOPHAGOGASTRODUODENOSCOPY N/A 09/25/2012   Procedure: ESOPHAGOGASTRODUODENOSCOPY (EGD);  Surgeon: Beryle Beams, MD;  Location: [facility];  Service: Endoscopy;  Laterality: N/A;  . FRACTURE SURGERY    . GASTROJEJUNOSTOMY  02-20-08   Roux en Y gastrojejunsotomy w 1 foot Roux limb  . GASTROSTOMY N/A 07/26/2014   Procedure: LAPRASCOPIC ASSISTED OPEN PLACEMENT OF JEJUNOSTOMY TUBE;  Surgeon: Pedro Earls, MD;  Location: [facility];  Service: General;  Laterality: N/A;  . HERNIA REPAIR     "hiatal"  . JOINT REPLACEMENT    . LAPAROSCOPIC NISSEN FUNDOPLICATION    . LAPAROSCOPIC PARTIAL GASTRECTOMY N/A 06/29/2013   Procedure: Gastrectomy and GJ Tube placement;  Surgeon: Pedro Earls, MD;  Location: [facility];  Service: General;  Laterality: N/A;  .  ORIF HIP FRACTURE Bilateral    "pins in both of them"  . REPAIR OF PERFORATED ULCER    . TONSILLECTOMY     Current Outpatient Prescriptions on File Prior to Visit  Medication Sig Dispense Refill  . albuterol (PROVENTIL HFA;VENTOLIN HFA) 108 (90 Base) MCG/ACT inhaler Inhale 2 puffs into the lungs every 4 (four) hours as needed (cough, shortness of breath or wheezing.). (Patient not taking: Reported on 04/12/2016) 1 Inhaler 3  . Alum & Mag Hydroxide-Simeth (GI COCKTAIL)  SUSP suspension Take 30 mLs by mouth 3 (three) times daily as needed for indigestion. Shake well. 600 mL 5  . feeding supplement (BOOST / RESOURCE BREEZE) LIQD Take 1 Container by mouth 3 (three) times daily between meals. 90 Container 11  . ipratropium (ATROVENT) 0.03 % nasal spray Place 2 sprays into both nostrils 2 (two) times daily. 30 mL 0  . omeprazole (PRILOSEC) 40 MG capsule TAKE ONE CAPSULE BY MOUTH TWICE DAILY 180 capsule 3  . UNABLE TO FIND Place 1 application into the nose once. Med Name: best Fit evaluation for oxygen, possibly simply go mini 1 Device 0  . UNABLE TO FIND Med Name: Best fit evaluation for oxygen - possible simply go mini 1 Device 0  . ipratropium-albuterol (DUONEB) 0.5-2.5 (3) MG/3ML SOLN Take 3 mLs by nebulization 4 (four) times daily -  before meals and at bedtime. 360 mL 3  . nicotine (NICODERM CQ - DOSED IN MG/24 HOURS) 21 mg/24hr patch Place 1-2 patches (21-42 mg total) onto the skin daily. 56 patch 0   No current facility-administered medications on file prior to visit.    Allergies  Allergen Reactions  . Lithium Nausea And Vomiting  . Celebrex [Celecoxib] Other (See Comments)    History of bleeding ulcers  . Morphine Itching  . Quetiapine Other (See Comments)     tardive dyskinesia  . Varenicline Tartrate Other (See Comments)    hallucinations   Family History  Problem Relation Age of Onset  . Cancer Mother     kidney, magliant tumor on face  . Stroke Mother   . Celiac disease  Father   . Cancer Father     kidney and pancreatic   Social History   Social History  . Marital status: Divorced    Spouse name: N/A  . Number of children: N/A  . Years of education: N/A   Social History Main Topics  . Smoking status: Current Every Day Smoker    Packs/day: 2.00    Years: 50.00    Types: Cigarettes  . Smokeless tobacco: Never Used  . Alcohol use No  . Drug use: No  . Sexual activity: No   Other Topics Concern  . None   Social History Narrative  . None    Review of Systems See hpi    Objective:   Physical Exam  Constitutional: She is oriented to person, place, and time. She appears well-developed and well-nourished. No distress.  HENT:  Head: Normocephalic and atraumatic.  Right Ear: External ear normal.  Left Ear: External ear normal.  Eyes: Conjunctivae are normal. No scleral icterus.  Neck: Normal range of motion. Neck supple. No thyromegaly present.  Cardiovascular: Normal rate, regular rhythm and intact distal pulses.   Murmur heard.  Decrescendo systolic murmur is present with a grade of 2/6  LUSB  Pulmonary/Chest: Effort normal and breath sounds normal. No respiratory distress.  Musculoskeletal: She exhibits no edema.  Lymphadenopathy:    She has no cervical adenopathy.  Neurological: She is alert and oriented to person, place, and time.  Skin: Skin is warm and dry. She is not diaphoretic. No erythema.  Psychiatric: She has a normal mood and affect. Her behavior is normal.          Assessment & Plan:  Repeat pap at f/u OV in 1 mo. Sched AWV Try flexeril - was prescribed a 5 mg once in 2013  Call in to [facility] to find about cyp metabolizers for medication.  1. Chronic  obstructive pulmonary disease, unspecified COPD type ([place])   2. Tobacco abuse   3. Coronary artery disease due to calcified coronary lesion   4. Chronic pain syndrome   5. Protein-calorie malnutrition, unspecified severity ([place])   6. Barrett's esophagus with  dysplasia   7. Constipation, chronic   8. Secondary hyperparathyroidism ([place])   9. Other osteoporosis, unspecified pathological fracture presence   10. Vitamin D deficiency   11. Iron deficiency anemia secondary to inadequate dietary iron intake   12. Anxiety state   13. Severe episode of recurrent major depressive disorder, without psychotic features ([place])   14. Adjustment disorder with mixed anxiety and depressed mood   15. Underweight     No orders of the defined types were placed in this encounter.   Meds ordered this encounter  Medications  . HYDROcodone-acetaminophen (NORCO) 10-325 MG tablet    Sig: Take 1-2 tablets by mouth every 6 (six) hours as needed. This is a 1 week supply. Ok to fill Saturday 04/06/16    Dispense:  60 tablet    Refill:  0  . HYDROcodone-acetaminophen (NORCO) 10-325 MG tablet    Sig: Take 1-2 tablets by mouth every 6 (six) hours as needed for moderate pain. This is a 1 week supply. Ok to fill Saturday 04/13/16    Dispense:  60 tablet    Refill:  0  . HYDROcodone-acetaminophen (NORCO) 10-325 MG tablet    Sig: Take 1-2 tablets by mouth every 6 (six) hours as needed. This is a 1 week supply. Ok to fill Saturday 04/20/16    Dispense:  60 tablet    Refill:  0  . HYDROcodone-acetaminophen (NORCO) 10-325 MG tablet    Sig: Take 1-2 tablets by mouth every 6 (six) hours as needed. This is a 1 week supply. Ok to fill Saturday 04/27/16    Dispense:  60 tablet    Refill:  0  . DISCONTD: ALPRAZolam (XANAX) 1 MG tablet    Sig: Take 1 tablet (1 mg total) by mouth at bedtime as needed for sleep. This is a 1 week supply. May fill Saturday 10/14    Dispense:  8 tablet    Refill:  0  . DISCONTD: ALPRAZolam (XANAX) 1 MG tablet    Sig: Take 1 tablet (1 mg total) by mouth at bedtime as needed for sleep. This is a 1 week supply. May fill on or after 04/13/2016    Dispense:  8 tablet    Refill:  0  . DISCONTD: ALPRAZolam (XANAX) 1 MG tablet    Sig: Take 1 tablet (1 mg  total) by mouth at bedtime as needed for anxiety or sleep. This is a 1 week supply. May fill on or after 04/20/2016    Dispense:  8 tablet    Refill:  0  . DISCONTD: ALPRAZolam (XANAX) 1 MG tablet    Sig: Take 1 tablet (1 mg total) by mouth at bedtime as needed for sleep. This is a 1 week supply. May fill on or after 04/27/2016    Dispense:  8 tablet    Refill:  0  . tiZANidine (ZANAFLEX) 2 MG tablet    Sig: Take 2-3 tablets (4-6 mg total) by mouth at bedtime as needed for muscle spasms.    Dispense:  60 tablet    Refill:  0  . ALPRAZolam (XANAX) 0.5 MG tablet    Sig: Take 1-2 tablets (0.5-1 mg total) by mouth 3 (three) times daily as needed for anxiety. Ok to  fill today 03/28/16    Dispense:  30 tablet    Refill:  0  . OLANZapine (ZYPREXA) 20 MG tablet    Sig: Take 1 tablet (20 mg total) by mouth at bedtime.    Failed risperdal, cannot tolerate seroquel due to tardive dyskinesia      [name], M.D.  [facility] [address] [place], [place] [address] [phone] phone [phone] fax  04/23/16 11:46 PM

## 2016-03-28 ENCOUNTER — Ambulatory Visit (INDEPENDENT_AMBULATORY_CARE_PROVIDER_SITE_OTHER): Payer: Medicare Other | Admitting: Family Medicine

## 2016-03-28 ENCOUNTER — Encounter: Payer: Self-pay | Admitting: Family Medicine

## 2016-03-28 ENCOUNTER — Telehealth: Payer: Self-pay

## 2016-03-28 VITALS — BP 130/80 | HR 86 | Temp 98.7°F | Resp 16 | Ht 64.5 in | Wt 107.0 lb

## 2016-03-28 DIAGNOSIS — I2510 Atherosclerotic heart disease of native coronary artery without angina pectoris: Secondary | ICD-10-CM

## 2016-03-28 DIAGNOSIS — D508 Other iron deficiency anemias: Secondary | ICD-10-CM | POA: Diagnosis not present

## 2016-03-28 DIAGNOSIS — E46 Unspecified protein-calorie malnutrition: Secondary | ICD-10-CM | POA: Diagnosis not present

## 2016-03-28 DIAGNOSIS — Z720 Tobacco use: Secondary | ICD-10-CM

## 2016-03-28 DIAGNOSIS — M818 Other osteoporosis without current pathological fracture: Secondary | ICD-10-CM

## 2016-03-28 DIAGNOSIS — N2581 Secondary hyperparathyroidism of renal origin: Secondary | ICD-10-CM | POA: Diagnosis not present

## 2016-03-28 DIAGNOSIS — F411 Generalized anxiety disorder: Secondary | ICD-10-CM

## 2016-03-28 DIAGNOSIS — K5909 Other constipation: Secondary | ICD-10-CM

## 2016-03-28 DIAGNOSIS — F4323 Adjustment disorder with mixed anxiety and depressed mood: Secondary | ICD-10-CM

## 2016-03-28 DIAGNOSIS — K22719 Barrett's esophagus with dysplasia, unspecified: Secondary | ICD-10-CM

## 2016-03-28 DIAGNOSIS — G894 Chronic pain syndrome: Secondary | ICD-10-CM | POA: Diagnosis not present

## 2016-03-28 DIAGNOSIS — J449 Chronic obstructive pulmonary disease, unspecified: Secondary | ICD-10-CM

## 2016-03-28 DIAGNOSIS — E559 Vitamin D deficiency, unspecified: Secondary | ICD-10-CM

## 2016-03-28 DIAGNOSIS — F332 Major depressive disorder, recurrent severe without psychotic features: Secondary | ICD-10-CM

## 2016-03-28 DIAGNOSIS — R636 Underweight: Secondary | ICD-10-CM

## 2016-03-28 DIAGNOSIS — I2584 Coronary atherosclerosis due to calcified coronary lesion: Secondary | ICD-10-CM

## 2016-03-28 MED ORDER — HYDROCODONE-ACETAMINOPHEN 10-325 MG PO TABS: 1.0000 | ORAL_TABLET | Freq: Four times a day (QID) | ORAL | 0 refills | Status: DC | PRN

## 2016-03-28 MED ORDER — ALPRAZOLAM 1 MG PO TABS: 1.0000 mg | ORAL_TABLET | Freq: Every evening | ORAL | 0 refills | Status: DC | PRN

## 2016-03-28 MED ORDER — TIZANIDINE HCL 2 MG PO TABS: 4.0000 mg | ORAL_TABLET | Freq: Every evening | ORAL | 0 refills | Status: DC | PRN

## 2016-03-28 MED ORDER — OLANZAPINE 20 MG PO TABS: 20.0000 mg | ORAL_TABLET | Freq: Every day | ORAL | Status: DC

## 2016-03-28 MED ORDER — ALPRAZOLAM 0.5 MG PO TABS: 0.5000 mg | ORAL_TABLET | Freq: Three times a day (TID) | ORAL | 0 refills | Status: DC | PRN

## 2016-03-28 NOTE — Telephone Encounter (Signed)
Kim-the pharmacist- would like to know why Mati is now being prescribed ALPRAZolam (XANAX) 0.5 MG tablet [id number] when she usually gets ALPRAZolam (XANAX) 1 MG tablet [id number]. Please advise Kim at [phone] ext [phone]

## 2016-03-28 NOTE — Patient Instructions (Addendum)
Double up on the olanzapine - take 2 tabs at night. You can try up to 3 tabs of the tizanadine (sedating muscle relaxer) and an alprazolam to try to sleep at night.  All of this should make you very sleep.  Make sure you are taking calcium and vitamin D supplements in addition to the A, C, E.  At your next visit in 5 weeks, make it for a wellness visit so we can do your pap smear and get FASTING labs.  Recommend getting an appointment with a psychiatrist so they can help [place] figure out your sleeping medicines. Highly reocmmend.  [place] [address], [place] [address]  [phone]   Talmage Coin, MD, [place], [place], [place] [address] Phone: [phone]    IF you received an x-ray today, you will receive an invoice from [facility]. Please contact [facility] at [phone] with questions or concerns regarding your invoice.   IF you received labwork today, you will receive an invoice from [employer]. Please contact [facility] at [phone] with questions or concerns regarding your invoice.   Our billing staff will not be able to assist you with questions regarding bills from these companies.  You will be contacted with the lab results as soon as they are available. The fastest way to get your results is to activate your My Chart account. Instructions are located on the last page of this paperwork. If you have not heard from [place] regarding the results in 2 weeks, please contact this office.    Insomnia Insomnia is a sleep disorder that makes it difficult to fall asleep or to stay asleep. Insomnia can cause tiredness (fatigue), low energy, difficulty concentrating, mood swings, and poor performance at work or school.  There are three different ways to classify insomnia:  Difficulty falling asleep.  Difficulty staying asleep.  Waking up too early in the morning. Any  type of insomnia can be long-term (chronic) or short-term (acute). Both are common. Short-term insomnia usually lasts for three months or less. Chronic insomnia occurs at least three times a week for longer than three months. CAUSES  Insomnia may be caused by another condition, situation, or substance, such as:  Anxiety.  Certain medicines.  Gastroesophageal reflux disease (GERD) or other gastrointestinal conditions.  Asthma or other breathing conditions.  Restless legs syndrome, sleep apnea, or other sleep disorders.  Chronic pain.  Menopause. This may include hot flashes.  Stroke.  Abuse of alcohol, tobacco, or illegal drugs.  Depression.  Caffeine.   Neurological disorders, such as Alzheimer disease.  An overactive thyroid (hyperthyroidism). The cause of insomnia may not be known. RISK FACTORS Risk factors for insomnia include:  Gender. Women are more commonly affected than men.  Age. Insomnia is more common as you get older.  Stress. This may involve your professional or personal life.  Income. Insomnia is more common in people with lower income.  Lack of exercise.   Irregular work schedule or night shifts.  Traveling between different time zones. SIGNS AND SYMPTOMS If you have insomnia, trouble falling asleep or trouble staying asleep is the main symptom. This may lead to other symptoms, such as:  Feeling fatigued.  Feeling nervous about going to sleep.  Not feeling rested in the morning.  Having trouble concentrating.  Feeling irritable, anxious, or depressed. TREATMENT  Treatment for insomnia depends on the cause. If your insomnia is caused by an underlying condition, treatment will  focus on addressing the condition. Treatment may also include:   Medicines to help you sleep.  Counseling or therapy.  Lifestyle adjustments. HOME CARE INSTRUCTIONS   Take medicines only as directed by your health care provider.  Keep regular sleeping and  waking hours. Avoid naps.  Keep a sleep diary to help you and your health care provider figure out what could be causing your insomnia. Include:   When you sleep.  When you wake up during the night.  How well you sleep.   How rested you feel the next day.  Any side effects of medicines you are taking.  What you eat and drink.   Make your bedroom a comfortable place where it is easy to fall asleep:  Put up shades or special blackout curtains to block light from outside.  Use a white noise machine to block noise.  Keep the temperature cool.   Exercise regularly as directed by your health care provider. Avoid exercising right before bedtime.  Use relaxation techniques to manage stress. Ask your health care provider to suggest some techniques that may work well for you. These may include:  Breathing exercises.  Routines to release muscle tension.  Visualizing peaceful scenes.  Cut back on alcohol, caffeinated beverages, and cigarettes, especially close to bedtime. These can disrupt your sleep.  Do not overeat or eat spicy foods right before bedtime. This can lead to digestive discomfort that can make it hard for you to sleep.  Limit screen use before bedtime. This includes:  Watching TV.  Using your smartphone, tablet, and computer.  Stick to a routine. This can help you fall asleep faster. Try to do a quiet activity, brush your teeth, and go to bed at the same time each night.  Get out of bed if you are still awake after 15 minutes of trying to sleep. Keep the lights down, but try reading or doing a quiet activity. When you feel sleepy, go back to bed.  Make sure that you drive carefully. Avoid driving if you feel very sleepy.  Keep all follow-up appointments as directed by your health care provider. This is important. SEEK MEDICAL CARE IF:   You are tired throughout the day or have trouble in your daily routine due to sleepiness.  You continue to have sleep  problems or your sleep problems get worse. SEEK IMMEDIATE MEDICAL CARE IF:   You have serious thoughts about hurting yourself or someone else.   This information is not intended to replace advice given to you by your health care provider. Make sure you discuss any questions you have with your health care provider.   Document Released: 06/07/2000 Document Revised: 03/01/2015 Document Reviewed: 03/11/2014 Elsevier Interactive Patient Education [employer].

## 2016-03-30 ENCOUNTER — Telehealth: Payer: Self-pay

## 2016-03-30 DIAGNOSIS — J449 Chronic obstructive pulmonary disease, unspecified: Secondary | ICD-10-CM | POA: Diagnosis not present

## 2016-03-30 NOTE — Telephone Encounter (Signed)
Dr. Brigitte Pulse, Patient wasn't given a hydrocodone prescription to fill for today's date but she has one for each week the rest of this month. Please advise!  [phone]

## 2016-03-30 NOTE — Telephone Encounter (Signed)
PATIENT CALLED BACK AND SAID TO DISREGARD THE MESSAGE FOR DR. SHAW. SHE FOUND WHAT SHE WAS LOOKING FOR. [place]

## 2016-03-31 NOTE — Telephone Encounter (Signed)
The rx for this week is already at [facility]. She just has to go there.

## 2016-04-04 ENCOUNTER — Other Ambulatory Visit: Payer: Self-pay

## 2016-04-04 ENCOUNTER — Telehealth: Payer: Self-pay

## 2016-04-04 MED ORDER — ONDANSETRON 4 MG PO TBDP: 4.0000 mg | ORAL_TABLET | Freq: Three times a day (TID) | ORAL | 0 refills | Status: DC | PRN

## 2016-04-04 NOTE — Telephone Encounter (Signed)
Spoke with pt regarding her zofran. It was sent to pharmacy

## 2016-04-04 NOTE — Telephone Encounter (Signed)
Patient needs her ondansetron (ZOFRAN ODT) 4 MG disintegrating tablet refilled--she uses the [facility]

## 2016-04-09 ENCOUNTER — Telehealth: Payer: Self-pay | Admitting: Family Medicine

## 2016-04-09 NOTE — Telephone Encounter (Signed)
lmom to call and reschedule appt that she had with Dr Brigitte Pulse on 05-23-2016 for a CPE the Doctor will be in class that day

## 2016-04-09 NOTE — Telephone Encounter (Signed)
Pt returned call. I rescheduled her for CPE with Dr Brigitte Pulse on 12/21. She is concerned about her medications since she wont have an OV until December. I assured her that it will not be a problem, but if someone can please call and explain to her that we will still refill meds up to the time of apt since it was a provider cancellation. Thanks!

## 2016-04-11 NOTE — Telephone Encounter (Signed)
Pt has appointment on 10/19 with Dr. Brigitte Pulse. She is very upset with what's going on in her life-her mom. She would like a CB from Dr. Brigitte Pulse

## 2016-04-12 ENCOUNTER — Ambulatory Visit (INDEPENDENT_AMBULATORY_CARE_PROVIDER_SITE_OTHER): Payer: Medicare Other | Admitting: Family Medicine

## 2016-04-12 ENCOUNTER — Telehealth: Payer: Self-pay | Admitting: Emergency Medicine

## 2016-04-12 VITALS — BP 140/80 | HR 78 | Temp 98.2°F | Resp 18 | Ht 64.5 in | Wt 112.0 lb

## 2016-04-12 DIAGNOSIS — R636 Underweight: Secondary | ICD-10-CM

## 2016-04-12 DIAGNOSIS — G894 Chronic pain syndrome: Secondary | ICD-10-CM | POA: Diagnosis not present

## 2016-04-12 DIAGNOSIS — F4320 Adjustment disorder, unspecified: Secondary | ICD-10-CM

## 2016-04-12 DIAGNOSIS — F4323 Adjustment disorder with mixed anxiety and depressed mood: Secondary | ICD-10-CM | POA: Diagnosis not present

## 2016-04-12 MED ORDER — ALPRAZOLAM 1 MG PO TABS: ORAL_TABLET | ORAL | 0 refills | Status: DC

## 2016-04-12 MED ORDER — OLANZAPINE 20 MG PO TABS: 20.0000 mg | ORAL_TABLET | Freq: Every day | ORAL | 2 refills | Status: DC

## 2016-04-12 MED ORDER — DULOXETINE HCL 60 MG PO CPEP: 60.0000 mg | ORAL_CAPSULE | Freq: Every day | ORAL | 2 refills | Status: DC

## 2016-04-12 NOTE — Telephone Encounter (Signed)
Left message to return call for further details.

## 2016-04-12 NOTE — Progress Notes (Signed)
By signing my name below, I, [name], attest that this documentation has been prepared under the direction and in the presence of [name], MD.  Electronically Signed: [name], Medical Scribe. 04/12/16. 10:40 AM.  Subjective:    Patient ID: [name], female    DOB: [birth year], 65 y.o.   MRN: [id number]  HPI Chief Complaint  Patient presents with  . Medication Refill    Hydrocodone  . Crying alot    Onset 2 months    HPI Comments: [name] is a 65 y.o. female who presents to the [facility] for dysphoric mood. Pt would like to get something today for her crying spells until she can get her medication tomorrow and she is out of cymbalta. Pt states she has been crying a lot and suspects it's because she messed up her medicines. [name] states pt is upset over her mom's health and has been irritable around her. Pt tearfully states her 41 y/o mom who is in hospice "doesn't know she's on the face of the Earth and will throw the phone in the trash and walk away when she's on the phone". Pt's moms care facility has parties in the afternoon and pt is upset she can't get transportation to them because her mom might recognize her if she attends. Pt doesn't go out often to see her mom since [place] can't see well enough to drive. Pt states [name] doesn't understand her situation since it's been a while since [name] lost her mom and was told by [name] "don't go, it doesn't matter" since her mom wont recognize her. Pt watches TV during the day and she doesn't sleep at night. Pt hasn't been going outside because she doesn't feel good. Pt states she can't get up and go, but if she had her hydrocodone she could get by and states she's a wreck; stating, "it's not worth being on Earth if I'm miserable like this". Pt has been coping with her stress with eating food. Pt reports stomach pain. Pt denies suicidal ideation.   Patient Active Problem List   Diagnosis Date Noted  .  Abdominal pain   . Hypoxia 04/08/2015  . Opioid use with withdrawal ([place])   . Protein-calorie malnutrition ([place])   . Episode of recurrent major depressive disorder ([place])   . Secondary hyperparathyroidism ([place]) 01/10/2015  . Vitamin D deficiency 01/10/2015  . 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 Anemia, iron deficiency 11/03/2014  . Chronic pain syndrome 10/24/2014  . Osteoporosis 10/05/2014  . Vertebral compression fracture ([place]) 10/05/2014  . Coronary artery disease due to calcified coronary lesion 10/05/2014  . Weight loss   . Tobacco abuse   . History of subtotal gastrectomy with Roux-en-Y recontruction   . Orthostatic hypotension 08/11/2014  . Severe protein-calorie malnutrition ([place]) 12/30/2013  . Constipation, chronic 07/10/2013  . Chronic abdominal pain   . COPD (chronic obstructive pulmonary disease) ([place]) 11/07/2010  . Major depressive disorder, recurrent episode, moderate ([name]) 06/06/2010  . 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 Anxiety state 01/07/2007  . Barrett's esophagus 07/03/2005   Past Medical History:  Diagnosis Date  . ACUTE DUODEN ULCER W/HEMORR PERF&OBSTRUCTION 06/26/2004   Qualifier: Diagnosis of  By: [name] MD, [name]   . Acute duodenal ulcer with hemorrhage and perforation, with obstruction ([place])   . Allergy   . Anemia   . Anginal pain ([place])    "many many years ago"  . Anxiety    sees Claiborne Billings  [place] NP at Dr. Radonna Ricker office  . Arthritis    "hips" (09/06/2014)  . Asthma   . Barrett's esophagus   . Chronic abdominal pain    narcotic dependence, dr gyarteng-dak at [facility]  . 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 Complication of anesthesia    pt states had too much xanax on board - agitated   . COPD (chronic obstructive pulmonary disease) ([place])   . Depression   . Exertional shortness of breath   . FEVER, RECURRENT 08/11/2009   Qualifier: Diagnosis of  By: [name] MD, [name]   . Fx of fibula 07-28-11    left fibula, 2 places   . Fx two ribs-open 08-24-11   left 4th and 5th  . GASTRIC OUTLET OBSTRUCTION 08/28/2007   In July 2008 she underwent laparoscopic enterolysis, Nissen fundoplication over a [id number] bougie, single pledgeted suture colosure of the hiatus and a 3 suture wrap.  Lap truncal vagotomy and a loop gastrojejunostomy was performed.  This was revised in August of 2009 to a roux en Y gastrojejujostomy.     . Gastroparesis 06/03/2007   Qualifier: Diagnosis of  By: [name] MD, [name]   . GERD (gastroesophageal reflux disease)   . Headache    "monthly" (09/06/2014)  . History of blood transfusion    "related to OR; maybe when I perforated that ulcer"  . History of hiatal hernia   . Lap Nissen + truncal vagotomy July 2008 05/13/2013  . LEG EDEMA 01/19/2008   Qualifier: Diagnosis of  By: [name] MD, [name]   . Menopause   . MENOPAUSE 01/07/2007   Qualifier: Diagnosis of  By: [name], CMA, E. Lopez    . Myocardial infarction   . Narcotic abuse   . Peptic ulcer disease    dr Oletta Lamas  . Pneumonia, organism unspecified(486) 11/07/2010   Assoc with R Parapneumonic effusion 09/2010    - Tapped 10/05/10    - CxR resolved 11/07/2010    . S/P jejunostomy Feb 2016 07/26/2014   Past Surgical History:  Procedure Laterality Date  . BIOPSY THYROID  08-13-11   benign nodule, per Dr. Melida Quitter   . CATARACT EXTRACTION W/ INTRAOCULAR LENS  IMPLANT, BILATERAL Bilateral   . COLONOSCOPY    . DILATION AND CURETTAGE OF UTERUS    . ESOPHAGOGASTRODUODENOSCOPY  12-29-09   dr qadeer at [facility], several gastric ulcers  . ESOPHAGOGASTRODUODENOSCOPY N/A 09/25/2012   Procedure: ESOPHAGOGASTRODUODENOSCOPY (EGD);  Surgeon: Beryle Beams, MD;  Location: [facility];  Service: Endoscopy;  Laterality: N/A;  . FRACTURE [name]    . GASTROJEJUNOSTOMY  02-20-08   Roux en Y gastrojejunsotomy w 1 foot Roux limb  . GASTROSTOMY N/A 07/26/2014   Procedure: LAPRASCOPIC ASSISTED OPEN PLACEMENT OF JEJUNOSTOMY TUBE;  Surgeon: Pedro Earls, MD;  Location: [facility];  Service: General;  Laterality: N/A;  . HERNIA REPAIR     "hiatal"  . JOINT REPLACEMENT    . LAPAROSCOPIC NISSEN FUNDOPLICATION    . LAPAROSCOPIC PARTIAL GASTRECTOMY N/A 06/29/2013   Procedure: Gastrectomy and GJ Tube placement;  Surgeon: Pedro Earls, MD;  Location: [facility];  Service: General;  Laterality: N/A;  . ORIF HIP FRACTURE Bilateral    "pins in both of them"  . REPAIR OF PERFORATED ULCER    . TONSILLECTOMY     Allergies  Allergen Reactions  . Lithium Nausea And Vomiting  . Celebrex [Celecoxib] Other (See Comments)    History of bleeding ulcers  .  Morphine Itching  . Quetiapine Other (See Comments)     tardive dyskinesia  . Varenicline Tartrate Other (See Comments)    hallucinations   Prior to Admission medications   Medication Sig Start Date End Date Taking? Authorizing Provider  ALPRAZolam [name]) 0.5 MG tablet Take 1-2 tablets (0.5-1 mg total) by mouth 3 (three) times daily as needed for anxiety. Ok to fill today 03/28/16 03/28/16  Yes Shawnee Knapp, MD  ALPRAZolam [name]) 1 MG tablet Take 1 tablet (1 mg total) by mouth at bedtime as needed for sleep. This is a 1 week supply. May fill Saturday 10/14 03/28/16  Yes Shawnee Knapp, MD  ALPRAZolam [name]) 1 MG tablet Take 1 tablet (1 mg total) by mouth at bedtime as needed for sleep. This is a 1 week supply. May fill on or after 04/13/2016 03/28/16  Yes Shawnee Knapp, MD  ALPRAZolam [name]) 1 MG tablet Take 1 tablet (1 mg total) by mouth at bedtime as needed for anxiety or sleep. This is a 1 week supply. May fill on or after 04/20/2016 03/28/16  Yes Shawnee Knapp, MD  ALPRAZolam [name]) 1 MG tablet Take 1 tablet (1 mg total) by mouth at bedtime as needed for sleep. This is a 1 week supply. May fill on or after 04/27/2016 03/28/16  Yes Shawnee Knapp, MD  Alum & Mag Hydroxide-Simeth (GI COCKTAIL) SUSP suspension Take 30 mLs by mouth 3 (three) times daily as needed for indigestion. Shake well. 02/15/16  Yes Shawnee Knapp, MD    DULoxetine (CYMBALTA) 60 MG capsule Take 1 capsule (60 mg total) by mouth daily. 03/02/16  Yes Shawnee Knapp, MD  feeding supplement (BOOST / RESOURCE BREEZE) LIQD Take 1 Container by mouth 3 (three) times daily between meals. 12/16/15  Yes Shawnee Knapp, MD  HYDROcodone-acetaminophen [facility]) 10-325 MG tablet Take 1-2 tablets by mouth every 6 (six) hours as needed. This is a 1 week supply. Ok to fill Saturday 04/06/16 03/28/16  Yes Shawnee Knapp, MD  HYDROcodone-acetaminophen [facility]) 10-325 MG tablet Take 1-2 tablets by mouth every 6 (six) hours as needed for moderate pain. This is a 1 week supply. Ok to fill Saturday 04/13/16 03/28/16  Yes Shawnee Knapp, MD  HYDROcodone-acetaminophen [facility] 10-325 MG tablet Take 1-2 tablets by mouth every 6 (six) hours as needed. This is a 1 week supply. Ok to fill Saturday 04/20/16 03/28/16  Yes Shawnee Knapp, MD  HYDROcodone-acetaminophen [name]) 10-325 MG tablet Take 1-2 tablets by mouth every 6 (six) hours as needed. This is a 1 week supply. Ok to fill Saturday 04/27/16 03/28/16  Yes Shawnee Knapp, MD  ipratropium (ATROVENT) 0.03 % nasal spray Place 2 sprays into both nostrils 2 (two) times daily. 10/20/15  Yes Posey Boyer, MD  ipratropium-albuterol (DUONEB) 0.5-2.5 (3) MG/3ML SOLN Take 3 mLs by nebulization 4 (four) times daily -  before meals and at bedtime. 06/03/15  Yes Robyn Haber, MD  nicotine (NICODERM CQ - DOSED IN MG/24 HOURS) 21 mg/24hr patch Place 1-2 patches (21-42 mg total) onto the skin daily. 01/13/16  Yes Shawnee Knapp, MD  OLANZapine (ZYPREXA) 20 MG tablet Take 1 tablet (20 mg total) by mouth at bedtime. 03/28/16  Yes Shawnee Knapp, MD  omeprazole (PRILOSEC) 40 MG capsule TAKE ONE CAPSULE BY MOUTH TWICE DAILY 10/10/15  Yes Shawnee Knapp, MD  ondansetron (ZOFRAN ODT) 4 MG disintegrating tablet Take 1-2 tablets (4-8 mg total) by mouth every 8 (eight)  hours as needed for nausea or vomiting. 04/04/16  Yes Shawnee Knapp, MD  tiZANidine (ZANAFLEX) 2 MG tablet Take 2-3 tablets (4-6 mg  total) by mouth at bedtime as needed for muscle spasms. 03/28/16  Yes Shawnee Knapp, MD  UNABLE TO FIND Place 1 application into the nose once. Med Name: best Fit evaluation for oxygen, possibly simply go mini 08/18/15  Yes Mancel Bale, PA-C  UNABLE TO FIND Med Name: Best fit evaluation for oxygen - possible simply go mini 08/18/15  Yes Mancel Bale, PA-C  zaleplon (SONATA) 10 MG capsule Take 1 capsule (10 mg total) by mouth at bedtime as needed for sleep. 03/19/16  Yes Shawnee Knapp, MD  albuterol (PROVENTIL HFA;VENTOLIN HFA) 108 (90 Base) MCG/ACT inhaler Inhale 2 puffs into the lungs every 4 (four) hours as needed (cough, shortness of breath or wheezing.). Patient not taking: Reported on 04/12/2016 08/01/15   Shawnee Knapp, MD   Social History   Social History  . Marital status: Divorced    Spouse name: N/A  . Number of children: N/A  . Years of education: N/A   Occupational History  . Not on file.   Social History Main Topics  . Smoking status: Current Every Day Smoker    Packs/day: 2.00    Years: 50.00    Types: Cigarettes  . Smokeless tobacco: Never Used  . Alcohol use No  . Drug use: No  . Sexual activity: No   Other Topics Concern  . Not on file   Social History Narrative  . No narrative on file   Review of Systems  Constitutional: Positive for activity change, appetite change (increased) and fatigue. Negative for chills, fever and unexpected weight change.  Respiratory: Negative for cough, chest tightness, shortness of breath and wheezing.   Cardiovascular: Positive for leg swelling. Negative for chest pain and palpitations.  Gastrointestinal: Positive for abdominal pain and nausea. Negative for abdominal distention, anal bleeding, blood in stool, diarrhea and vomiting.  Allergic/Immunologic: Negative for food allergies.  Neurological: Positive for weakness. Negative for syncope.  Hematological: Bruises/bleeds easily.  Psychiatric/Behavioral: Positive for dysphoric mood and  sleep disturbance. Negative for self-injury and suicidal ideas.   Objective:  Physical Exam  Constitutional: She appears well-developed and well-nourished. No distress.  HENT:  Head: Normocephalic and atraumatic.  Eyes: Conjunctivae are normal.  Neck: Neck supple.  Cardiovascular: Normal rate, regular rhythm and normal heart sounds.  Exam reveals no gallop and no friction rub.   No murmur heard. Pulmonary/Chest: Effort normal and breath sounds normal. No respiratory distress. She has no wheezes. She has no rales.  Neurological: She is alert.  Skin: Skin is warm and dry.  Psychiatric: She has a normal mood and affect. Her behavior is normal.  Nursing note and vitals reviewed.  BP 140/80 (BP Location: Right Arm, Patient Position: Sitting, Cuff Size: Normal)   Pulse 78   Temp 98.2 F (36.8 C) (Oral)   Resp 18   Ht 5' 4.5" (1.638 m)   Wt 112 lb (50.8 kg)   SpO2 97%   BMI 18.93 kg/m   Assessment & Plan:   1. Anticipatory grieving   2. Adjustment disorder with mixed anxiety and depressed mood   3. Chronic pain syndrome   4. Underweight    [name] is really heavily grieving now that her mother has been put on hospice for dementia. [name] gets hurt and upset by her mother's dementia - almost angry when her mother doesn't recognize  her. STRONGLY advised hospice grief counseling. Will increase alprazolam some but when pt has stopped grieving, we will restart weaning off again.   If off of xanax, I would be willing to start her on hydrocodone ER to improve pain control as she asks every visit.  Meds ordered this encounter  Medications  . ALPRAZolam (XANAX) 1 MG tablet    Sig: Take 1 tabs po qd prn panic attack and 2 tabs po qhs prn sleep. This is a 1 week supply. May fill Friday 10/20    Dispense:  24 tablet    Refill:  0  . ALPRAZolam (XANAX) 1 MG tablet    Sig: Take 1 tabs po qd prn panic attack and 2 tabs po qhs prn sleep. This is a 1 week supply. May fill Friday 10/20This is a 1  week supply. May fill on or after 04/20/2016    Dispense:  21 tablet    Refill:  0    Please cancel the prior rx on file  . ALPRAZolam (XANAX) 1 MG tablet    Sig: Take 1 tabs po qd prn panic attack and 2 tabs po qhs prn sleep. This is a 1 week supply. May fill Friday 11/04    Dispense:  21 tablet    Refill:  0    Please cancel prior prescriptions on file.  Marland Kitchen ALPRAZolam (XANAX) 1 MG tablet    Sig: Take 1 tabs po qd prn panic attack and 2 tabs po qhs prn sleep. This is a 1 week supply. May fill Saturday 11/11    Dispense:  21 tablet    Refill:  0  . DULoxetine (CYMBALTA) 60 MG capsule    Sig: Take 1 capsule (60 mg total) by mouth daily.    Dispense:  30 capsule    Refill:  2  . OLANZapine (ZYPREXA) 20 MG tablet    Sig: Take 1 tablet (20 mg total) by mouth at bedtime.    Dispense:  30 tablet    Refill:  2    Failed risperdal, cannot tolerate seroquel due to tardive dyskinesia    I personally performed the services described in this documentation, which was scribed in my presence. The recorded information has been reviewed and considered, and addended by me as needed.   [name], M.D.  [facility] [address] [place],  [address] [phone] phone [phone] fax  04/21/16 11:05 PM

## 2016-04-12 NOTE — Patient Instructions (Addendum)
Call [facility] - you HAVE to start with grief counseling - there is no other way you are going to make it through. Call [phone] since we are increasing the alprazolam we need to make sure you have a professional therapist to lead you through it.  IF you received an x-ray today, you will receive an invoice from [facility]. Please contact [facility] at [phone] with questions or concerns regarding your invoice.   IF you received labwork today, you will receive an invoice from [employer]. Please contact [facility] at [phone] with questions or concerns regarding your invoice.   Our billing staff will not be able to assist you with questions regarding bills from these companies.  You will be contacted with the lab results as soon as they are available. The fastest way to get your results is to activate your My Chart account. Instructions are located on the last page of this paperwork. If you have not heard from [place] regarding the results in 2 weeks, please contact this office.    Complicated Grieving Grief is a normal response to the death of someone close to you. Feelings of fear, anger, and guilt can affect almost everyone who loses a loved one. It is also common to have symptoms of depression while you are grieving. These include problems with sleep, loss of appetite, and lack of energy. They may last for weeks or months after a loss. Complicated grief is different from normal grief or depression. Normal grieving involves sadness and feelings of loss, but these feelings are not constant. Complicated grief is a constant and severe type of grief. It interferes with your ability to function normally. It may last for several months to a year or longer. Complicated grief may require treatment from a mental health care provider. CAUSES  It is not known why some people continue to struggle with grief and others do not. You may be at  higher risk for complicated grief if:  The death of your loved one was sudden or unexpected.  The death of your loved one was due to a violent event.  Your loved one committed suicide.  Your loved one was a child or a young person.  You were very close to or dependent on the loved one.  You have a history of depression. SIGNS AND SYMPTOMS Signs and symptoms of complicated grief may include:  Feeling disbelief or numbness.  Being unable to enjoy good memories of your loved one.  Needing to avoid anything that reminds you of your loved one.  Being unable to stop thinking about the death.  Feeling intense anger or guilt.  Feeling alone and hopeless.  Feeling that your life is meaningless and empty.  Losing the desire to live. DIAGNOSIS Your health care provider may diagnose complicated grief if:  You have constant symptoms of grief for 6-12 months or longer.  Your symptoms are interfering with your ability to live your life. Your health care provider may want you to see a mental health care provider. Many symptoms of depression are similar to the symptoms of complicated grief. It is important to be evaluated for complicated grief along with other mental health conditions. TREATMENT  Talk therapy with a mental health provider is the most common treatment for complicated grief. During therapy, you will learn healthy ways to cope with the loss of your loved one. In some cases, your mental health care provider may also recommend antidepressant medicines. HOME CARE INSTRUCTIONS  Take care of yourself.  Eat regular meals and maintain a healthy diet. Eat plenty of fruits, vegetables, and whole grains.  Try to get some exercise each day.  Keep regular hours for sleep. Try to get at least 8 hours of sleep each night.  Do not use drugs or alcohol to ease your symptoms.  Take medicines only as directed by your health care provider.  Spend time with friends and loved  ones.  Consider joining a grief (bereavement) support group to help you deal with your loss.  Keep all follow-up visits as directed by your health care provider. This is important. SEEK MEDICAL CARE IF:  Your symptoms keep you from functioning normally.  Your symptoms do not get better with treatment. SEEK IMMEDIATE MEDICAL CARE IF:  You have serious thoughts of hurting yourself or someone else.  You have suicidal feelings.   This information is not intended to replace advice given to you by your health care provider. Make sure you discuss any questions you have with your health care provider.   Document Released: 06/10/2005 Document Revised: 03/01/2015 Document Reviewed: 11/18/2013 Elsevier Interactive Patient Education [employer].

## 2016-04-18 ENCOUNTER — Other Ambulatory Visit: Payer: Self-pay | Admitting: Family Medicine

## 2016-04-19 NOTE — Telephone Encounter (Signed)
pts pharmacy is requesting refill on lasix. I searched her chart cannot find lasix ordered. Please advise

## 2016-04-20 DIAGNOSIS — J449 Chronic obstructive pulmonary disease, unspecified: Secondary | ICD-10-CM | POA: Diagnosis not present

## 2016-04-22 ENCOUNTER — Other Ambulatory Visit: Payer: Self-pay | Admitting: Family Medicine

## 2016-04-29 ENCOUNTER — Telehealth: Payer: Self-pay

## 2016-04-29 NOTE — Telephone Encounter (Signed)
PATIENT WOULD LIKE DR. SHAW TO KNOW THAT SHE NEEDS A REFILL ON HER HYDROCODONE 10-325 MG FOR HER STOMACH. PLEASE CALL HER WHEN THE PRESCRIPTION CAN BE PICKED UP. BEST PHONE [phone] (CELL)  [place]

## 2016-04-30 DIAGNOSIS — J449 Chronic obstructive pulmonary disease, unspecified: Secondary | ICD-10-CM | POA: Diagnosis not present

## 2016-04-30 NOTE — Telephone Encounter (Signed)
Pt need a refill on her Xanax and hydrocodone please respond

## 2016-05-02 MED ORDER — ALPRAZOLAM 1 MG PO TABS: ORAL_TABLET | ORAL | 0 refills | Status: DC

## 2016-05-02 MED ORDER — HYDROCODONE-ACETAMINOPHEN 10-325 MG PO TABS: 1.0000 | ORAL_TABLET | Freq: Four times a day (QID) | ORAL | 0 refills | Status: DC | PRN

## 2016-05-03 ENCOUNTER — Ambulatory Visit: Payer: Medicare Other

## 2016-05-03 DIAGNOSIS — M7061 Trochanteric bursitis, right hip: Secondary | ICD-10-CM | POA: Diagnosis not present

## 2016-05-03 DIAGNOSIS — M545 Low back pain: Secondary | ICD-10-CM | POA: Diagnosis not present

## 2016-05-04 ENCOUNTER — Ambulatory Visit (INDEPENDENT_AMBULATORY_CARE_PROVIDER_SITE_OTHER): Payer: Medicare Other | Admitting: Family Medicine

## 2016-05-04 VITALS — BP 176/90 | HR 72 | Temp 97.7°F | Resp 17 | Ht 64.5 in | Wt 110.0 lb

## 2016-05-04 DIAGNOSIS — G894 Chronic pain syndrome: Secondary | ICD-10-CM

## 2016-05-04 DIAGNOSIS — F4323 Adjustment disorder with mixed anxiety and depressed mood: Secondary | ICD-10-CM | POA: Diagnosis not present

## 2016-05-04 DIAGNOSIS — M7631 Iliotibial band syndrome, right leg: Secondary | ICD-10-CM | POA: Diagnosis not present

## 2016-05-04 DIAGNOSIS — M79604 Pain in right leg: Secondary | ICD-10-CM | POA: Diagnosis not present

## 2016-05-04 MED ORDER — HYDROCODONE-ACETAMINOPHEN 10-325 MG PO TABS: 1.0000 | ORAL_TABLET | Freq: Four times a day (QID) | ORAL | 0 refills | Status: DC | PRN

## 2016-05-04 MED ORDER — ALPRAZOLAM 1 MG PO TABS: ORAL_TABLET | ORAL | 0 refills | Status: DC

## 2016-05-04 MED ORDER — ALPRAZOLAM 1 MG PO TABS
ORAL_TABLET | ORAL | 0 refills | Status: DC
Start: 2016-05-04 — End: 2016-05-28

## 2016-05-04 NOTE — Progress Notes (Addendum)
Subjective:  By signing my name below, I, [name], attest that this documentation has been prepared under the direction and in the presence of [name], MD.  Electronically Signed: [name], ED Scribe. 05/04/2016. 11:44 AM.   Patient ID: [name], female    DOB: April 29, 1951, 65 y.o.   MRN: [id number]  HPI Chief Complaint  Patient presents with  . Leg Pain    Right. Previous surgery.   . Medication Refill    xanax    HPI Comments: [name] is a 65 y.o. female who presents to the [facility] complaining of throbbing right leg pain and swelling. She reports hx of fx hips. She was seen by Dr. Alfonso Ramus at [facility].  She received XR. She was prescribed a 6 day prednisone tapered which started yesterday.    Pt is still having trouble sleeping. She is requesting a refill of xanax.    Patient Active Problem List   Diagnosis Date Noted  . Abdominal pain   . Hypoxia 04/08/2015  . Opioid use with withdrawal ([name])   . Protein-calorie malnutrition ([place])   . Episode of recurrent major depressive disorder ([place])   . Secondary hyperparathyroidism ([place]) 01/10/2015  . Vitamin D deficiency 01/10/2015  . 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 Anemia, iron deficiency 11/03/2014  . Chronic pain syndrome 10/24/2014  . Osteoporosis 10/05/2014  . Vertebral compression fracture ([place]) 10/05/2014  . Coronary artery disease due to calcified coronary lesion 10/05/2014  . Weight loss   . Tobacco abuse   . History of subtotal gastrectomy with Roux-en-Y recontruction   . Orthostatic hypotension 08/11/2014  . Severe protein-calorie malnutrition ([place]) 12/30/2013  . Constipation, chronic 07/10/2013  . Chronic abdominal pain   . COPD (chronic obstructive pulmonary disease) ([place]) 11/07/2010  . Major depressive disorder, recurrent episode, moderate ([place]) 06/06/2010  . 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 Anxiety state 01/07/2007  . Barrett's esophagus 07/03/2005     Past Medical History:  Diagnosis Date  . ACUTE DUODEN ULCER W/HEMORR PERF&OBSTRUCTION 06/26/2004   Qualifier: Diagnosis of  By: [name] MD, [name]   . Acute duodenal ulcer with hemorrhage and perforation, with obstruction ([place])   . Allergy   . Anemia   . Anginal pain ([place])    "many many years ago"  . Anxiety    sees [name] NP at Dr. Radonna Ricker office  . Arthritis    "hips" (09/06/2014)  . Asthma   . Barrett's esophagus   . Chronic abdominal pain    narcotic dependence, dr gyarteng-dak at [facility]  . 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 Complication of anesthesia    pt states had too much xanax on board - agitated   . COPD (chronic obstructive pulmonary disease) ([place])   . Depression   . Exertional shortness of breath   . FEVER, RECURRENT 08/11/2009   Qualifier: Diagnosis of  By: [name] MD, [name]   . Fx of fibula 07-28-11   left fibula, 2 places   . Fx two ribs-open 08-24-11   left 4th and 5th  . GASTRIC OUTLET OBSTRUCTION 08/28/2007   In July 2008 she underwent laparoscopic enterolysis, Nissen fundoplication over a [id number] bougie, single pledgeted suture colosure of the hiatus and a 3 suture wrap.  Lap truncal vagotomy and a loop gastrojejunostomy was performed.  This was revised in August of 2009 to a roux en Y gastrojejujostomy.     Marland Kitchen  Gastroparesis 06/03/2007   Qualifier: Diagnosis of  By: [name] MD, [name]   . GERD (gastroesophageal reflux disease)   . Headache    "monthly" (09/06/2014)  . History of blood transfusion    "related to OR; maybe when I perforated that ulcer"  . History of hiatal hernia   . Lap Nissen + truncal vagotomy July 2008 05/13/2013  . LEG EDEMA 01/19/2008   Qualifier: Diagnosis of  By: [name] MD, [name]   . Menopause   . MENOPAUSE 01/07/2007   Qualifier: Diagnosis of  By: [name], CMA, [place]    . Myocardial infarction   . Narcotic abuse   . Peptic ulcer disease    dr Oletta Lamas  . Pneumonia, organism  unspecified(486) 11/07/2010   Assoc with R Parapneumonic effusion 09/2010    - Tapped 10/05/10    - CxR resolved 11/07/2010    . S/P jejunostomy Feb 2016 07/26/2014   Past Surgical History:  Procedure Laterality Date  . BIOPSY THYROID  08-13-11   benign nodule, per Dr. Melida Quitter   . CATARACT EXTRACTION W/ INTRAOCULAR LENS  IMPLANT, BILATERAL Bilateral   . COLONOSCOPY    . DILATION AND CURETTAGE OF UTERUS    . ESOPHAGOGASTRODUODENOSCOPY  12-29-09   dr qadeer at [facility], several gastric ulcers  . ESOPHAGOGASTRODUODENOSCOPY N/A 09/25/2012   Procedure: ESOPHAGOGASTRODUODENOSCOPY (EGD);  Surgeon: Beryle Beams, MD;  Location: [facility];  Service: Endoscopy;  Laterality: N/A;  . FRACTURE SURGERY    . GASTROJEJUNOSTOMY  02-20-08   Roux en Y gastrojejunsotomy w 1 foot Roux limb  . GASTROSTOMY N/A 07/26/2014   Procedure: LAPRASCOPIC ASSISTED OPEN PLACEMENT OF JEJUNOSTOMY TUBE;  Surgeon: Pedro Earls, MD;  Location: [facility];  Service: General;  Laterality: N/A;  . HERNIA REPAIR     "hiatal"  . JOINT REPLACEMENT    . LAPAROSCOPIC NISSEN FUNDOPLICATION    . LAPAROSCOPIC PARTIAL GASTRECTOMY N/A 06/29/2013   Procedure: Gastrectomy and GJ Tube placement;  Surgeon: Pedro Earls, MD;  Location: [facility];  Service: General;  Laterality: N/A;  . ORIF HIP FRACTURE Bilateral    "pins in both of them"  . REPAIR OF PERFORATED ULCER    . TONSILLECTOMY     Allergies  Allergen Reactions  . Lithium Nausea And Vomiting  . Celebrex [Celecoxib] Other (See Comments)    History of bleeding ulcers  . Morphine Itching  . Quetiapine Other (See Comments)     tardive dyskinesia  . Varenicline Tartrate Other (See Comments)    hallucinations   Prior to Admission medications   Medication Sig Start Date End Date Taking? Authorizing Provider  albuterol (PROVENTIL HFA;VENTOLIN HFA) 108 (90 Base) MCG/ACT inhaler Inhale 2 puffs into the lungs every 4 (four) hours as needed (cough, shortness of breath or wheezing.).  08/01/15  Yes Shawnee Knapp, MD  ALPRAZolam [name]) 0.5 MG tablet Take 1-2 tablets (0.5-1 mg total) by mouth 3 (three) times daily as needed for anxiety. Ok to fill today 03/28/16 03/28/16  Yes Shawnee Knapp, MD  ALPRAZolam [name]) 1 MG tablet Take 1 tabs po qd prn panic attack and 2 tabs po qhs prn sleep. This is a 1 week supply. May fill Friday 10/20 04/12/16  Yes Shawnee Knapp, MD  ALPRAZolam [name]) 1 MG tablet Take 1 tabs po qd prn panic attack and 2 tabs po qhs prn sleep. This is a 1 week supply. May fill Friday 10/20This is a 1 week supply. May fill on or after  04/20/2016 04/12/16  Yes Shawnee Knapp, MD  ALPRAZolam [name]) 1 MG tablet Take 1 tabs po qd prn panic attack and 2 tabs po qhs prn sleep. This is a 1 week supply. May fill Friday 11/04 04/12/16  Yes Shawnee Knapp, MD  ALPRAZolam [name]) 1 MG tablet Take 1 tabs po qd prn panic attack and 2 tabs po qhs prn sleep. This is a 1 week supply. May fill Saturday 11/11 05/02/16  Yes Shawnee Knapp, MD  Alum & Mag Hydroxide-Simeth (GI COCKTAIL) SUSP suspension Take 30 mLs by mouth 3 (three) times daily as needed for indigestion. Shake well. 02/15/16  Yes Shawnee Knapp, MD  DULoxetine (CYMBALTA) 60 MG capsule Take 1 capsule (60 mg total) by mouth daily. 04/12/16  Yes Shawnee Knapp, MD  feeding supplement (BOOST / RESOURCE BREEZE) LIQD Take 1 Container by mouth 3 (three) times daily between meals. 12/16/15  Yes Shawnee Knapp, MD  furosemide (LASIX) 20 MG tablet TAKE 1 TABLET ONCE DAILY AS NEEDED FOR EDEMA. 04/19/16  Yes Shawnee Knapp, MD  HYDROcodone-acetaminophen (NORCO) 10-325 MG tablet Take 1-2 tablets by mouth every 6 (six) hours as needed for moderate pain. This is a 1 week supply. Ok to fill Saturday 04/13/16 03/28/16  Yes Shawnee Knapp, MD  HYDROcodone-acetaminophen [facility]) 10-325 MG tablet Take 1-2 tablets by mouth every 6 (six) hours as needed. This is a 1 week supply. Ok to fill Saturday 04/20/16 03/28/16  Yes Shawnee Knapp, MD  HYDROcodone-acetaminophen [facility] 10-325 MG tablet Take 1-2  tablets by mouth every 6 (six) hours as needed. This is a 1 week supply. Ok to fill Saturday 04/27/16 03/28/16  Yes Shawnee Knapp, MD  HYDROcodone-acetaminophen [facility]) 10-325 MG tablet Take 1-2 tablets by mouth every 6 (six) hours as needed. This is a 1 week supply. Ok to fill Saturday 05/04/16 05/02/16  Yes Shawnee Knapp, MD  ipratropium (ATROVENT) 0.03 % nasal spray Place 2 sprays into both nostrils 2 (two) times daily. 10/20/15  Yes Posey Boyer, MD  ipratropium-albuterol (DUONEB) 0.5-2.5 (3) MG/3ML SOLN Take 3 mLs by nebulization 4 (four) times daily -  before meals and at bedtime. 06/03/15  Yes Robyn Haber, MD  nicotine (NICODERM CQ - DOSED IN MG/24 HOURS) 21 mg/24hr patch Place 1-2 patches (21-42 mg total) onto the skin daily. 01/13/16  Yes Shawnee Knapp, MD  OLANZapine (ZYPREXA) 20 MG tablet Take 1 tablet (20 mg total) by mouth at bedtime. 04/12/16  Yes Shawnee Knapp, MD  omeprazole (PRILOSEC) 40 MG capsule TAKE ONE CAPSULE BY MOUTH TWICE DAILY 10/10/15  Yes Shawnee Knapp, MD  ondansetron (ZOFRAN ODT) 4 MG disintegrating tablet Take 1-2 tablets (4-8 mg total) by mouth every 8 (eight) hours as needed for nausea or vomiting. 04/04/16  Yes Shawnee Knapp, MD  tiZANidine (ZANAFLEX) 2 MG tablet Take 2-3 tablets (4-6 mg total) by mouth at bedtime as needed for muscle spasms. 03/28/16  Yes Shawnee Knapp, MD  UNABLE TO FIND Place 1 application into the nose once. Med Name: best Fit evaluation for oxygen, possibly simply go mini 08/18/15  Yes Mancel Bale, PA-C  UNABLE TO FIND Med Name: Best fit evaluation for oxygen - possible simply go mini 08/18/15  Yes Mancel Bale, PA-C   Social History   Social History  . Marital status: Divorced    Spouse name: N/A  . Number of children: N/A  . Years of education: N/A  Occupational History  . Not on file.   Social History Main Topics  . Smoking status: Current Every Day Smoker    Packs/day: 2.00    Years: 50.00    Types: Cigarettes  . Smokeless tobacco: Never Used  .  Alcohol use No  . Drug use: No  . Sexual activity: No   Other Topics Concern  . Not on file   Social History Narrative  . No narrative on file   Review of Systems  Musculoskeletal: Positive for myalgias. Negative for arthralgias, back pain and gait problem.  Neurological: Negative for weakness and numbness.   Objective:   Physical Exam  Constitutional: She is oriented to person, place, and time. She appears well-developed and well-nourished. No distress.  Cardiovascular: Normal rate.   Pulmonary/Chest: Effort normal.  Musculoskeletal:  No sig over lumbar spinous process. Minimal pain over bilateral SI joint. Right greater that left pain over trochanter heads. Flexion mildly limited in hip but excellent internal and external rotation. Pain with stretching of the right IT band.   Neurological: She is alert and oriented to person, place, and time.  Skin: Skin is warm and dry.  Psychiatric: She has a normal mood and affect. Her behavior is normal.  Nursing note and vitals reviewed.  Vitals:   05/04/16 1116  BP: (!) 176/90  Pulse: 72  Resp: 17  Temp: 97.7 F (36.5 C)  TempSrc: Oral  SpO2: 97%  Weight: 110 lb (49.9 kg)  Height: 5' 4.5" (1.638 m)    Assessment & Plan:   1. Acute leg pain, right   2. Iliotibial band syndrome, right   3. Chronic pain syndrome   4. Adjustment disorder with mixed anxiety and depressed mood    New elkev bp Orders Placed This Encounter  Procedures  . Ambulatory referral to Physical Therapy    Referral Priority:   Routine    Referral Type:   Physical Medicine    Referral Reason:   Specialty Services Required    Requested Specialty:   Physical Therapy    Number of Visits Requested:   1    Meds ordered this encounter  Medications  . HYDROcodone-acetaminophen (NORCO) 10-325 MG tablet    Sig: Take 1-2 tablets by mouth every 6 (six) hours as needed. This is a 1 week supply. Ok to fill Saturday 05/25/16    Dispense:  60 tablet    Refill:  0   . HYDROcodone-acetaminophen (NORCO) 10-325 MG tablet    Sig: Take 1-2 tablets by mouth every 6 (six) hours as needed for moderate pain. This is a 1 week supply. Ok to fill Saturday 05/11/2016    Dispense:  60 tablet    Refill:  0  . HYDROcodone-acetaminophen (NORCO) 10-325 MG tablet    Sig: Take 1-2 tablets by mouth every 6 (six) hours as needed. This is a 1 week supply. Ok to fill Saturday 05/18/2016    Dispense:  60 tablet    Refill:  0  . HYDROcodone-acetaminophen (NORCO) 10-325 MG tablet    Sig: Take 1-2 tablets by mouth every 6 (six) hours as needed. This is a 1 week supply. Ok to fill Saturday 06/01/16    Dispense:  60 tablet    Refill:  0  . ALPRAZolam (XANAX) 1 MG tablet    Sig: Take 1 tabs po qd prn panic attack and 2 tabs po qhs prn sleep. This is a 1 week supply. May fill Saturday 05/11/2016    Dispense:  21 tablet  Refill:  0  . ALPRAZolam (XANAX) 1 MG tablet    Sig: Take 1 tabs po qd prn panic attack and 2 tabs po qhs prn sleep. This is a 1 week supply. May fill Saturday 05/18/16    Dispense:  21 tablet    Refill:  0  . ALPRAZolam (XANAX) 1 MG tablet    Sig: Take 1 tabs po qd prn panic attack and 2 tabs po qhs prn sleep. This is a 1 week supply. May fill Saturday 05/25/2016    Dispense:  21 tablet    Refill:  0  . ALPRAZolam (XANAX) 1 MG tablet    Sig: Take 1 tabs po qd prn panic attack and 2 tabs po qhs prn sleep. This is a 1 week supply. May fill Saturday 12/09    Dispense:  21 tablet    Refill:  0   Over 25 min spent in face-to-face evaluation of and consultation with patient and coordination of care.  Over 50% of this time was spent counseling this patient.  I personally performed the services described in this documentation, which was scribed in my presence. The recorded information has been reviewed and considered, and addended by me as needed.   [name], M.D.  [facility] [address] [place], [place] [address] [phone]  phone [phone] fax  05/04/16 2:28 PM

## 2016-05-04 NOTE — Patient Instructions (Signed)
     IF you received an x-ray today, you will receive an invoice from [facility]. Please contact [facility] at [phone] with questions or concerns regarding your invoice.   IF you received labwork today, you will receive an invoice from [facility]. Please contact [facility] at [phone] with questions or concerns regarding your invoice.   Our billing staff will not be able to assist you with questions regarding bills from these companies.  You will be contacted with the lab results as soon as they are available. The fastest way to get your results is to activate your My Chart account. Instructions are located on the last page of this paperwork. If you have not heard from us regarding the results in 2 weeks, please contact this office.      

## 2016-05-06 ENCOUNTER — Telehealth: Payer: Self-pay

## 2016-05-06 NOTE — Telephone Encounter (Signed)
Error

## 2016-05-09 ENCOUNTER — Telehealth: Payer: Self-pay

## 2016-05-09 NOTE — Telephone Encounter (Signed)
Patient called stated she ws told at the ortho that she has bursitis and the Ortho doctor wouldn't give her anything for the pain and she wants to know if Dr Brigitte Pulse will give her something to help.  Her call back number is [phone]

## 2016-05-10 ENCOUNTER — Ambulatory Visit (INDEPENDENT_AMBULATORY_CARE_PROVIDER_SITE_OTHER): Payer: Medicare Other | Admitting: Family Medicine

## 2016-05-10 VITALS — BP 136/78 | HR 79 | Temp 98.5°F | Resp 18 | Wt 107.0 lb

## 2016-05-10 DIAGNOSIS — M7631 Iliotibial band syndrome, right leg: Secondary | ICD-10-CM

## 2016-05-10 MED ORDER — KETOROLAC TROMETHAMINE 30 MG/ML IJ SOLN: 30.0000 mg | Freq: Once | INTRAMUSCULAR | Status: DC

## 2016-05-10 MED ORDER — TROLAMINE SALICYLATE 10 % EX CREA
1.0000 | TOPICAL_CREAM | CUTANEOUS | 0 refills | Status: DC | PRN
Start: 2016-05-10 — End: 2016-06-22

## 2016-05-10 MED ORDER — KETOROLAC TROMETHAMINE 60 MG/2ML IM SOLN
60.0000 mg | Freq: Once | INTRAMUSCULAR | Status: AC
  Administered 2016-05-10: 60 mg via INTRAMUSCULAR

## 2016-05-10 NOTE — Telephone Encounter (Signed)
[name] called and I gave her Dr. Raul Del message.  She verbalized understanding although she was not pleased.

## 2016-05-10 NOTE — Telephone Encounter (Signed)
Pt is maxed out on hydrocodone.  She cannot take any more narcotics due to the acetaminophen component.  I discussed this with her at the visit and she has been referred to PT to treat her bursitis. She can use topical aspercream or a salonpas patch.

## 2016-05-10 NOTE — Telephone Encounter (Signed)
Dr. Brigitte Pulse, Please advise. Thank you!

## 2016-05-10 NOTE — Patient Instructions (Addendum)
Try Epsom salt warm baths.  Try freezing a coke bottle and rolling it up and down the outside of your leg. Try rubbing the leg down with aspercreme.     IF you received an x-ray today, you will receive an invoice from [facility]. Please contact [facility] at [phone] with questions or concerns regarding your invoice.   IF you received labwork today, you will receive an invoice from [facility]. Please contact [facility] at [phone] with questions or concerns regarding your invoice.   Our billing staff will not be able to assist you with questions regarding bills from these companies.  You will be contacted with the lab results as soon as they are available. The fastest way to get your results is to activate your My Chart account. Instructions are located on the last page of this paperwork. If you have not heard from [place] regarding the results in 2 weeks, please contact this office.      Iliotibial Band Syndrome Iliotibial band syndrome is pain in the outer, lower thigh. The pain is caused by an inflammation of the iliotibial band. This is a band of thick fibrous tissue that runs down the outside of the thigh. The iliotibial band begins at the hip. It extends to the outer side of the shin bone (tibia) just below the knee joint. The band works with the thigh muscles. Together they provide stability to the outside of the knee joint. Iliotibial band syndrome occurs when there is inflammation to this band of tissue. This is typically due to over use and not due to an injury. The irritation usually occurs over the outside of the knee joint, at the the end of the thigh bone (femur). The iliotibial band crosses bone and muscle at this point. Between these structures is a cushioning sac (bursa). The bursa should make possible a smooth gliding motion. However, when inflamed, the iliotibial band does not glide easily. When inflamed, there is pain with motion  of the knee. Usually the pain worsens with continued movement and the pain goes away with rest. This problem usually arises when there is a sudden increase in sports activities involving your legs. Running, and playing soccer or basketball are examples of activities causing this. Others who are prone to iliotibial band syndrome include individuals with mechanical problems such as leg length differences, abnormality of walking, bowed legs etc. HOME CARE INSTRUCTIONS   Apply ice to the injured area:  Put ice in a plastic bag.  Place a towel between your skin and the bag.  Leave the ice on for 20 minutes, 2-3 times a day.  Limit excessive training or eliminate training until pain goes away.  While pain is present, you may use gentle range of motion. Do not resume regular use until instructed by your health care provider. Begin use gradually. Do not increase activity to the point of pain. If pain does develop, decrease activity and continue the above measures. Gradually increase activities that do not cause discomfort. Do this until you finally achieve normal use.  Perform low-impact activities while pain is present. Wear proper footwear.  Only take over-the-counter or prescription medicines for pain, discomfort, or fever as directed by your health care provider. SEEK MEDICAL CARE IF:   Your pain increases or pain is not controlled with medications.  You develop new, unexplained symptoms, or an increase of the symptoms that brought you to your health care provider.  Your pain and symptoms are not improving or are  getting worse. This information is not intended to replace advice given to you by your health care provider. Make sure you discuss any questions you have with your health care provider. Document Released: 11/30/2001 Document Revised: 07/01/2014 Document Reviewed: 01/07/2013 Elsevier Interactive Patient Education  2017 [employer].

## 2016-05-10 NOTE — Telephone Encounter (Signed)
LMVM for patient to CB.   

## 2016-05-10 NOTE — Progress Notes (Addendum)
Subjective:  By signing my name below, I, [name], attest that this documentation has been prepared under the direction and in the presence of [name], MD Electronically Signed: Ladene Artist, ED Scribe 05/10/2016 at 10:56 AM.   Patient ID: [name], female    DOB: [birth year], 65 y.o.   MRN: [id number]  Chief Complaint  Patient presents with  . Leg Pain    right, not getting better   HPI HPI Comments: [name] is a 65 y.o. female who presents to the [facility] complaining of persistent  right leg pain. Pt reports the worse pain in her anterior thigh that radiates from her right hip to her right knee. She states that she wraps her thigh with an Ace wrap and applies a heating pad with temporary relief. Pt states that physical therapy called her but she can not be seen for 4-6 weeks. She denies back pain. She requests an injection for pain at this visit.   Pt states that she has not yet visited her mother who was recently turned over to hospice. She plans to speak with a grief counselor but states that she has not done this yet.   Past Medical History:  Diagnosis Date  . ACUTE DUODEN ULCER W/HEMORR PERF&OBSTRUCTION 06/26/2004   Qualifier: Diagnosis of  By: [name] MD, [name]   . Acute duodenal ulcer with hemorrhage and perforation, with obstruction ([place])   . Allergy   . Anemia   . Anginal pain ([place])    "many many years ago"  . Anxiety    sees [name] NP at Dr. Radonna Ricker office  . Arthritis    "hips" (09/06/2014)  . Asthma   . Barrett's esophagus   . Chronic abdominal pain    narcotic dependence, dr gyarteng-dak at [facility]  . 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 Complication of anesthesia    pt states had too much xanax on board - agitated   . COPD (chronic obstructive pulmonary disease) ([place])   . Depression   . Exertional shortness of breath   . FEVER, RECURRENT 08/11/2009   Qualifier: Diagnosis  of  By: [name] MD, [name]   . Fx of fibula 07-28-11   left fibula, 2 places   . Fx two ribs-open 08-24-11   left 4th and 5th  . GASTRIC OUTLET OBSTRUCTION 08/28/2007   In July 2008 she underwent laparoscopic enterolysis, Nissen fundoplication over a [id number] bougie, single pledgeted suture colosure of the hiatus and a 3 suture wrap.  Lap truncal vagotomy and a loop gastrojejunostomy was performed.  This was revised in August of 2009 to a roux en Y gastrojejujostomy.     . Gastroparesis 06/03/2007   Qualifier: Diagnosis of  By: [name] MD, [name]   . GERD (gastroesophageal reflux disease)   . Headache    "monthly" (09/06/2014)  . History of blood transfusion    "related to OR; maybe when I perforated that ulcer"  . History of hiatal hernia   . Lap Nissen + truncal vagotomy July 2008 05/13/2013  . LEG EDEMA 01/19/2008   Qualifier: Diagnosis of  By: [name] MD, [name]   . Menopause   . MENOPAUSE 01/07/2007   Qualifier: Diagnosis of  By: [name], CMA, [place]    . Myocardial infarction   . Narcotic abuse   . Peptic ulcer disease    dr Oletta Lamas  . Pneumonia, organism unspecified(486) 11/07/2010   Assoc  with R Parapneumonic effusion 09/2010    - Tapped 10/05/10    - CxR resolved 11/07/2010    . S/P jejunostomy Feb 2016 07/26/2014   Current Outpatient Prescriptions on File Prior to Visit  Medication Sig Dispense Refill  . albuterol (PROVENTIL HFA;VENTOLIN HFA) 108 (90 Base) MCG/ACT inhaler Inhale 2 puffs into the lungs every 4 (four) hours as needed (cough, shortness of breath or wheezing.). 1 Inhaler 3  . ALPRAZolam (XANAX) 0.5 MG tablet Take 1-2 tablets (0.5-1 mg total) by mouth 3 (three) times daily as needed for anxiety. Ok to fill today 03/28/16 30 tablet 0  . ALPRAZolam (XANAX) 1 MG tablet Take 1 tabs po qd prn panic attack and 2 tabs po qhs prn sleep. This is a 1 week supply. May fill Saturday 05/11/2016 21 tablet 0  . ALPRAZolam (XANAX) 1 MG tablet Take 1 tabs po qd prn panic attack and 2 tabs po qhs prn  sleep. This is a 1 week supply. May fill Saturday 05/18/16 21 tablet 0  . ALPRAZolam (XANAX) 1 MG tablet Take 1 tabs po qd prn panic attack and 2 tabs po qhs prn sleep. This is a 1 week supply. May fill Saturday 05/25/2016 21 tablet 0  . ALPRAZolam (XANAX) 1 MG tablet Take 1 tabs po qd prn panic attack and 2 tabs po qhs prn sleep. This is a 1 week supply. May fill Saturday 12/09 21 tablet 0  . Alum & Mag Hydroxide-Simeth (GI COCKTAIL) SUSP suspension Take 30 mLs by mouth 3 (three) times daily as needed for indigestion. Shake well. 600 mL 5  . DULoxetine (CYMBALTA) 60 MG capsule Take 1 capsule (60 mg total) by mouth daily. 30 capsule 2  . feeding supplement (BOOST / RESOURCE BREEZE) LIQD Take 1 Container by mouth 3 (three) times daily between meals. 90 Container 11  . furosemide (LASIX) 20 MG tablet TAKE 1 TABLET ONCE DAILY AS NEEDED FOR EDEMA. 30 tablet 0  . HYDROcodone-acetaminophen (NORCO) 10-325 MG tablet Take 1-2 tablets by mouth every 6 (six) hours as needed. This is a 1 week supply. Ok to fill Saturday 05/25/16 60 tablet 0  . HYDROcodone-acetaminophen (NORCO) 10-325 MG tablet Take 1-2 tablets by mouth every 6 (six) hours as needed for moderate pain. This is a 1 week supply. Ok to fill Saturday 05/11/2016 60 tablet 0  . HYDROcodone-acetaminophen (NORCO) 10-325 MG tablet Take 1-2 tablets by mouth every 6 (six) hours as needed. This is a 1 week supply. Ok to fill Saturday 05/18/2016 60 tablet 0  . HYDROcodone-acetaminophen (NORCO) 10-325 MG tablet Take 1-2 tablets by mouth every 6 (six) hours as needed. This is a 1 week supply. Ok to fill Saturday 06/01/16 60 tablet 0  . ipratropium (ATROVENT) 0.03 % nasal spray Place 2 sprays into both nostrils 2 (two) times daily. 30 mL 0  . ipratropium-albuterol (DUONEB) 0.5-2.5 (3) MG/3ML SOLN Take 3 mLs by nebulization 4 (four) times daily -  before meals and at bedtime. 360 mL 3  . nicotine (NICODERM CQ - DOSED IN MG/24 HOURS) 21 mg/24hr patch Place 1-2 patches  (21-42 mg total) onto the skin daily. 56 patch 0  . OLANZapine (ZYPREXA) 20 MG tablet Take 1 tablet (20 mg total) by mouth at bedtime. 30 tablet 2  . omeprazole (PRILOSEC) 40 MG capsule TAKE ONE CAPSULE BY MOUTH TWICE DAILY 180 capsule 3  . ondansetron (ZOFRAN ODT) 4 MG disintegrating tablet Take 1-2 tablets (4-8 mg total) by mouth every 8 (eight) hours as  needed for nausea or vomiting. 180 tablet 0  . tiZANidine (ZANAFLEX) 2 MG tablet Take 2-3 tablets (4-6 mg total) by mouth at bedtime as needed for muscle spasms. 60 tablet 0  . UNABLE TO FIND Place 1 application into the nose once. Med Name: best Fit evaluation for oxygen, possibly simply go mini 1 Device 0  . UNABLE TO FIND Med Name: Best fit evaluation for oxygen - possible simply go mini 1 Device 0   No current facility-administered medications on file prior to visit.    Allergies  Allergen Reactions  . Lithium Nausea And Vomiting  . Celebrex [Celecoxib] Other (See Comments)    History of bleeding ulcers  . Morphine Itching  . Quetiapine Other (See Comments)     tardive dyskinesia  . Varenicline Tartrate Other (See Comments)    hallucinations   Review of Systems  Constitutional: Positive for activity change and fatigue. Negative for appetite change and unexpected weight change.  Respiratory: Negative for cough, shortness of breath and wheezing.   Cardiovascular: Positive for leg swelling.  Musculoskeletal: Positive for arthralgias, back pain, gait problem and myalgias.  Neurological: Positive for weakness.  Psychiatric/Behavioral: Positive for agitation, behavioral problems, confusion, decreased concentration, dysphoric mood and sleep disturbance. Negative for hallucinations, self-injury and suicidal ideas. The patient is nervous/anxious. The patient is not hyperactive.    BP 136/78 (BP Location: Right Arm, Patient Position: Sitting, Cuff Size: Normal)   Pulse 79   Temp 98.5 F (36.9 C)   Resp 18   Wt 107 lb (48.5 kg)   SpO2  97%   BMI 18.08 kg/m     Objective:   Physical Exam  Constitutional: She is oriented to person, place, and time. She appears well-developed and well-nourished. No distress.  HENT:  Head: Normocephalic and atraumatic.  Eyes: Conjunctivae and EOM are normal.  Neck: Neck supple. No tracheal deviation present.  Cardiovascular: Normal rate, regular rhythm and normal heart sounds.   Pulmonary/Chest: Effort normal and breath sounds normal. No respiratory distress.  Musculoskeletal: Normal range of motion.  No tenderness over lumbar spine. Positive spasms in R paraspinal. No focal tenderness over SI joint or great trochanter but severe spasm with stretching of the IT band.   Neurological: She is alert and oriented to person, place, and time.  Skin: Skin is warm and dry.  Psychiatric: She has a normal mood and affect. Her behavior is normal.  Nursing note and vitals reviewed.     Assessment & Plan:   1. Iliotibial band syndrome, right leg     Orders Placed This Encounter  Procedures  . Ambulatory referral to Physical Therapy    Referral Priority:   Routine    Referral Type:   Physical Medicine    Referral Reason:   Specialty Services Required    Requested Specialty:   Physical Therapy    Number of Visits Requested:   1    Meds ordered this encounter  Medications  . DISCONTD: ketorolac (TORADOL) 30 MG/ML injection 30 mg  . trolamine salicylate (ASPERCREME) 10 % cream    Sig: Apply 1 application topically as needed for muscle pain.    Dispense:  85 g    Refill:  0  . ketorolac (TORADOL) injection 60 mg    I personally performed the services described in this documentation, which was scribed in my presence. The recorded information has been reviewed and considered, and addended by me as needed.   [name], M.D.  [facility]  [place] [address] [place], [place] [address] [phone] phone [phone] fax  05/22/16 9:26 PM

## 2016-05-12 ENCOUNTER — Other Ambulatory Visit: Payer: Self-pay | Admitting: Family Medicine

## 2016-05-13 ENCOUNTER — Ambulatory Visit: Payer: Medicare Other

## 2016-05-17 ENCOUNTER — Ambulatory Visit (INDEPENDENT_AMBULATORY_CARE_PROVIDER_SITE_OTHER): Payer: Medicare Other | Admitting: Family Medicine

## 2016-05-17 VITALS — BP 120/74 | HR 78 | Temp 99.3°F | Resp 16 | Ht 64.5 in | Wt 110.8 lb

## 2016-05-17 DIAGNOSIS — M7631 Iliotibial band syndrome, right leg: Secondary | ICD-10-CM | POA: Diagnosis not present

## 2016-05-17 MED ORDER — METAXALONE 800 MG PO TABS: 800.0000 mg | ORAL_TABLET | Freq: Four times a day (QID) | ORAL | 0 refills | Status: DC | PRN

## 2016-05-17 NOTE — Patient Instructions (Addendum)
IF you received an x-ray today, you will receive an invoice from [facility]. Please contact [facility] at [phone] with questions or concerns regarding your invoice.   IF you received labwork today, you will receive an invoice from [employer]. Please contact [facility] at [phone] with questions or concerns regarding your invoice.   Our billing staff will not be able to assist you with questions regarding bills from these companies.  You will be contacted with the lab results as soon as they are available. The fastest way to get your results is to activate your My Chart account. Instructions are located on the last page of this paperwork. If you have not heard from [place] regarding the results in 2 weeks, please contact this office.     If you are unable to obtain a compound cream at our local [facility] pharmacy then we can fax an order in to [facility] pharmacy and [facility]. You can go online to see their website. Fax [phone]. Their phone number is [phone] or you can email them [email or web address]   Iliotibial Band Syndrome Rehab Ask your health care provider which exercises are safe for you. Do exercises exactly as told by your health care provider and adjust them as directed. It is normal to feel mild stretching, pulling, tightness, or discomfort as you do these exercises, but you should stop right away if you feel sudden pain or your pain gets worse.Do not begin these exercises until told by your health care provider. Stretching and range of motion exercises These exercises warm up your muscles and joints and improve the movement and flexibility of your hip and pelvis. Exercise A: Quadriceps, prone 1. Lie on your abdomen on a firm surface, such as a bed or padded floor. 2. Bend your left / right knee and hold your ankle. If you cannot reach your ankle or pant leg, loop a belt around your foot and grab the  belt instead. 3. Gently pull your heel toward your buttocks. Your knee should not slide out to the side. You should feel a stretch in the front of your thigh and knee. 4. Hold this position for __________ seconds. Repeat __________ times. Complete this stretch __________ times a day. Exercise B: Iliotibial band 1. Lie on your side with your left / right leg in the top position. 2. Bend both of your knees and grab your left / right ankle. Stretch out your bottom arm to help you balance. 3. Slowly bring your top knee back so your thigh goes behind your trunk. 4. Slowly lower your top leg toward the floor until you feel a gentle stretch on the outside of your left / right hip and thigh. If you do not feel a stretch and your knee will not fall farther, place the heel of your other foot on top of your knee and pull your knee down toward the floor with your foot. 5. Hold this position for __________ seconds. Repeat __________ times. Complete this stretch __________ times a day. Strengthening exercises These exercises build strength and endurance in your hip and pelvis. Endurance is the ability to use your muscles for a long time, even after they get tired. Exercise C: Straight leg raises (hip abductors) 1. Lie on your side with your left / right leg in the top position. Lie so your head, shoulder, knee, and hip line up. You may bend your bottom knee to help you balance. 2. Roll your hips slightly  forward so your hips are stacked directly over each other and your left / right knee is facing forward. 3. Tense the muscles in your outer thigh and lift your top leg 4-6 inches (10-15 cm). 4. Hold this position for __________ seconds. 5. Slowly return to the starting position. Let your muscles relax completely before doing another repetition. Repeat __________ times. Complete this exercise __________ times a day. Exercise D: Straight leg raises (hip extensors) 1. Lie on your abdomen on your bed or a firm  surface. You can put a pillow under your hips if that is more comfortable. 2. Bend your left / right knee so your foot is straight up in the air. 3. Squeeze your buttock muscles and lift your left / right thigh off the bed. Do not let your back arch. 4. Tense this muscle as hard as you can without increasing any knee pain. 5. Hold this position for __________ seconds. 6. Slowly lower your leg to the starting position and allow it to relax completely. Repeat __________ times. Complete this exercise __________ times a day. Exercise E: Hip hike 1. Stand sideways on a bottom step. Stand on your left / right leg with your other foot unsupported next to the step. You can hold onto the railing or wall if needed for balance. 2. Keep your knees straight and your torso square. Then, lift your left / right hip up toward the ceiling. 3. Slowly let your left / right hip lower toward the floor, past the starting position. Your foot should get closer to the floor. Do not lean or bend your knees. Repeat __________ times. Complete this exercise __________ times a day. This information is not intended to replace advice given to you by your health care provider. Make sure you discuss any questions you have with your health care provider. Document Released: 06/10/2005 Document Revised: 02/13/2016 Document Reviewed: 05/12/2015 Elsevier Interactive Patient Education  2017 [employer].

## 2016-05-17 NOTE — Progress Notes (Signed)
By signing my name below, I, [name], attest that this documentation has been prepared under the direction and in the presence of [name], MD.  Electronically Signed: [name], Medical Scribe. 05/17/16. 11:00 AM.  Subjective:    Patient ID: [name], female    DOB: [birth year], 65 y.o.   MRN: [id number]  HPI Chief Complaint  Patient presents with  . Follow-up    muscle in right leg No better     HPI Comments: AKEERA HENNE is a 65 y.o. female who presents to the [facility] for right leg pain follow-up. Reports her right leg still hurts, and is having lower extremity swelling this morning. Pt was compliant with prednisone and hasn't found relief to her symptoms. She has also been taking lasix regularly. She's been putting her leg up, used aspercreme once, and has been using a heating pad without relief of her sxs. She's made an appt with a PT last night.  She states her breathing has been "okay".  Patient Active Problem List   Diagnosis Date Noted  . Abdominal pain   . Hypoxia 04/08/2015  . Opioid use with withdrawal ([place])   . Protein-calorie malnutrition ([place])   . Episode of recurrent major depressive disorder ([place])   . Secondary hyperparathyroidism ([place]) 01/10/2015  . Vitamin D deficiency 01/10/2015  . 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 Anemia, iron deficiency 11/03/2014  . Chronic pain syndrome 10/24/2014  . Osteoporosis 10/05/2014  . Vertebral compression fracture ([place]) 10/05/2014  . Coronary artery disease due to calcified coronary lesion 10/05/2014  . Weight loss   . Tobacco abuse   . History of subtotal gastrectomy with Roux-en-Y recontruction   . Orthostatic hypotension 08/11/2014  . Severe protein-calorie malnutrition ([place]) 12/30/2013  . Constipation, chronic 07/10/2013  . Chronic abdominal pain   . COPD (chronic obstructive pulmonary disease) ([place]) 11/07/2010  . Major depressive disorder, recurrent episode,  moderate ([place]) 06/06/2010  . 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 Anxiety state 01/07/2007  . Barrett's esophagus 07/03/2005   Past Medical History:  Diagnosis Date  . ACUTE DUODEN ULCER W/HEMORR PERF&OBSTRUCTION 06/26/2004   Qualifier: Diagnosis of  By: [name] MD, [name]   . Acute duodenal ulcer with hemorrhage and perforation, with obstruction ([place])   . Allergy   . Anemia   . Anginal pain ([place])    "many many years ago"  . Anxiety    sees [name] NP at Dr. Radonna Ricker office  . Arthritis    "hips" (09/06/2014)  . Asthma   . Barrett's esophagus   . Chronic abdominal pain    narcotic dependence, dr gyarteng-dak at [facility]  . 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 Complication of anesthesia    pt states had too much xanax on board - agitated   . COPD (chronic obstructive pulmonary disease) ([place])   . Depression   . Exertional shortness of breath   . FEVER, RECURRENT 08/11/2009   Qualifier: Diagnosis of  By: [name] MD, [name]   . Fx of fibula 07-28-11   left fibula, 2 places   . Fx two ribs-open 08-24-11   left 4th and 5th  . GASTRIC OUTLET OBSTRUCTION 08/28/2007   In July 2008 she underwent laparoscopic enterolysis, Nissen fundoplication over a [id number] bougie, single pledgeted suture colosure of the hiatus and a 3 suture wrap.  Lap truncal vagotomy and a loop gastrojejunostomy was performed.  This was revised in  August of 2009 to a roux en Y gastrojejujostomy.     . Gastroparesis 06/03/2007   Qualifier: Diagnosis of  By: [name] MD, [name]   . GERD (gastroesophageal reflux disease)   . Headache    "monthly" (09/06/2014)  . History of blood transfusion    "related to OR; maybe when I perforated that ulcer"  . History of hiatal hernia   . Lap Nissen + truncal vagotomy July 2008 05/13/2013  . LEG EDEMA 01/19/2008   Qualifier: Diagnosis of  By: [name] MD, [name]   . Menopause   . MENOPAUSE 01/07/2007   Qualifier: Diagnosis of  By: [name], CMA, [place]      . Myocardial infarction   . Narcotic abuse   . Peptic ulcer disease    dr Oletta Lamas  . Pneumonia, organism unspecified([place]) 11/07/2010   Assoc with R Parapneumonic effusion 09/2010    - Tapped 10/05/10    - CxR resolved 11/07/2010    . S/P jejunostomy Feb 2016 07/26/2014   Past Surgical History:  Procedure Laterality Date  . BIOPSY THYROID  08-13-11   benign nodule, per Dr. Melida Quitter   . CATARACT EXTRACTION W/ INTRAOCULAR LENS  IMPLANT, BILATERAL Bilateral   . COLONOSCOPY    . DILATION AND CURETTAGE OF UTERUS    . ESOPHAGOGASTRODUODENOSCOPY  12-29-09   dr qadeer at [facility], several gastric ulcers  . ESOPHAGOGASTRODUODENOSCOPY N/A 09/25/2012   Procedure: ESOPHAGOGASTRODUODENOSCOPY (EGD);  Surgeon: Beryle Beams, MD;  Location: [facility];  Service: Endoscopy;  Laterality: N/A;  . FRACTURE SURGERY    . GASTROJEJUNOSTOMY  02-20-08   Roux en Y gastrojejunsotomy w 1 foot Roux limb  . GASTROSTOMY N/A 07/26/2014   Procedure: LAPRASCOPIC ASSISTED OPEN PLACEMENT OF JEJUNOSTOMY TUBE;  Surgeon: Pedro Earls, MD;  Location: [facility];  Service: General;  Laterality: N/A;  . HERNIA REPAIR     "hiatal"  . JOINT REPLACEMENT    . LAPAROSCOPIC NISSEN FUNDOPLICATION    . LAPAROSCOPIC PARTIAL GASTRECTOMY N/A 06/29/2013   Procedure: Gastrectomy and GJ Tube placement;  Surgeon: Pedro Earls, MD;  Location: [facility];  Service: General;  Laterality: N/A;  . ORIF HIP FRACTURE Bilateral    "pins in both of them"  . REPAIR OF PERFORATED ULCER    . TONSILLECTOMY     Allergies  Allergen Reactions  . Lithium Nausea And Vomiting  . Celebrex [Celecoxib] Other (See Comments)    History of bleeding ulcers  . Morphine Itching  . Quetiapine Other (See Comments)     tardive dyskinesia  . Varenicline Tartrate Other (See Comments)    hallucinations   Prior to Admission medications   Medication Sig Start Date End Date Taking? Authorizing Provider  albuterol (PROVENTIL HFA;VENTOLIN HFA) 108 (90 Base) MCG/ACT  inhaler Inhale 2 puffs into the lungs every 4 (four) hours as needed (cough, shortness of breath or wheezing.). 08/01/15   Shawnee Knapp, MD  ALPRAZolam [name]) 0.5 MG tablet Take 1-2 tablets (0.5-1 mg total) by mouth 3 (three) times daily as needed for anxiety. Ok to fill today 03/28/16 03/28/16   Shawnee Knapp, MD  ALPRAZolam [name]) 1 MG tablet Take 1 tabs po qd prn panic attack and 2 tabs po qhs prn sleep. This is a 1 week supply. May fill Saturday 05/11/2016 05/04/16   Shawnee Knapp, MD  ALPRAZolam [name]) 1 MG tablet Take 1 tabs po qd prn panic attack and 2 tabs po qhs prn sleep. This is a 1 week  supply. May fill Saturday 05/18/16 05/04/16   Shawnee Knapp, MD  ALPRAZolam [name]) 1 MG tablet Take 1 tabs po qd prn panic attack and 2 tabs po qhs prn sleep. This is a 1 week supply. May fill Saturday 05/25/2016 05/04/16   Shawnee Knapp, MD  ALPRAZolam [name]) 1 MG tablet Take 1 tabs po qd prn panic attack and 2 tabs po qhs prn sleep. This is a 1 week supply. May fill Saturday 12/09 05/04/16   Shawnee Knapp, MD  Alum & Mag Hydroxide-Simeth (GI COCKTAIL) SUSP suspension Take 30 mLs by mouth 3 (three) times daily as needed for indigestion. Shake well. 02/15/16   Shawnee Knapp, MD  DULoxetine (CYMBALTA) 60 MG capsule TAKE (1) CAPSULE DAILY. 05/13/16   Shawnee Knapp, MD  feeding supplement (BOOST / RESOURCE BREEZE) LIQD Take 1 Container by mouth 3 (three) times daily between meals. 12/16/15   Shawnee Knapp, MD  furosemide (LASIX) 20 MG tablet TAKE 1 TABLET ONCE DAILY AS NEEDED FOR EDEMA. 04/19/16   Shawnee Knapp, MD  HYDROcodone-acetaminophen (NORCO) 10-325 MG tablet Take 1-2 tablets by mouth every 6 (six) hours as needed. This is a 1 week supply. Ok to fill Saturday 05/25/16 05/04/16   Shawnee Knapp, MD  HYDROcodone-acetaminophen [facility]) 10-325 MG tablet Take 1-2 tablets by mouth every 6 (six) hours as needed for moderate pain. This is a 1 week supply. Ok to fill Saturday 05/11/2016 05/04/16   Shawnee Knapp, MD  HYDROcodone-acetaminophen [facility])  10-325 MG tablet Take 1-2 tablets by mouth every 6 (six) hours as needed. This is a 1 week supply. Ok to fill Saturday 05/18/2016 05/04/16   Shawnee Knapp, MD  HYDROcodone-acetaminophen [facility]) 10-325 MG tablet Take 1-2 tablets by mouth every 6 (six) hours as needed. This is a 1 week supply. Ok to fill Saturday 06/01/16 05/04/16   Shawnee Knapp, MD  ipratropium (ATROVENT) 0.03 % nasal spray Place 2 sprays into both nostrils 2 (two) times daily. 10/20/15   Posey Boyer, MD  ipratropium-albuterol (DUONEB) 0.5-2.5 (3) MG/3ML SOLN Take 3 mLs by nebulization 4 (four) times daily -  before meals and at bedtime. 06/03/15   Robyn Haber, MD  nicotine (NICODERM CQ - DOSED IN MG/24 HOURS) 21 mg/24hr patch Place 1-2 patches (21-42 mg total) onto the skin daily. 01/13/16   Shawnee Knapp, MD  OLANZapine (ZYPREXA) 20 MG tablet Take 1 tablet (20 mg total) by mouth at bedtime. 04/12/16   Shawnee Knapp, MD  omeprazole (PRILOSEC) 40 MG capsule TAKE ONE CAPSULE BY MOUTH TWICE DAILY 10/10/15   Shawnee Knapp, MD  ondansetron (ZOFRAN ODT) 4 MG disintegrating tablet Take 1-2 tablets (4-8 mg total) by mouth every 8 (eight) hours as needed for nausea or vomiting. 04/04/16   Shawnee Knapp, MD  tiZANidine (ZANAFLEX) 2 MG tablet Take 2-3 tablets (4-6 mg total) by mouth at bedtime as needed for muscle spasms. 03/28/16   Shawnee Knapp, MD  trolamine salicylate (ASPERCREME) 10 % cream Apply 1 application topically as needed for muscle pain. 05/10/16   Shawnee Knapp, MD  UNABLE TO FIND Place 1 application into the nose once. Med Name: best Fit evaluation for oxygen, possibly simply go mini 08/18/15   Mancel Bale, PA-C  UNABLE TO FIND Med Name: Best fit evaluation for oxygen - possible simply go mini 08/18/15   Mancel Bale, PA-C   Social History   Social History  . Marital  status: Divorced    Spouse name: N/A  . Number of children: N/A  . Years of education: N/A   Occupational History  . Not on file.   Social History Main Topics  . Smoking  status: Current Every Day Smoker    Packs/day: 2.00    Years: 50.00    Types: Cigarettes  . Smokeless tobacco: Never Used  . Alcohol use No  . Drug use: No  . Sexual activity: No   Other Topics Concern  . Not on file   Social History Narrative  . No narrative on file   Review of Systems  Cardiovascular: Positive for leg swelling.  Musculoskeletal: Positive for myalgias.   Objective:  Physical Exam  Constitutional: She appears well-developed and well-nourished. No distress.  HENT:  Head: Normocephalic and atraumatic.  Eyes: Conjunctivae are normal.  Neck: Neck supple.  Cardiovascular: Normal rate, regular rhythm and normal heart sounds.  Exam reveals no gallop and no friction rub.   No murmur heard. Pulmonary/Chest: Effort normal and breath sounds normal. No respiratory distress. She has no wheezes. She has no rales.  Musculoskeletal: Edema: bilateral with 2+ pitting edema on the left.  Minimal tenderness over the greater trocantere  Neurological: She is alert.  Skin: Skin is warm and dry.  Psychiatric: She has a normal mood and affect. Her behavior is normal.  Nursing note and vitals reviewed.  BP 120/74 (BP Location: Right Arm, Patient Position: Sitting, Cuff Size: Small)   Pulse 78   Temp 99.3 F (37.4 C) (Oral)   Resp 16   Ht 5' 4.5" (1.638 m)   Wt 110 lb 12.8 oz (50.3 kg)   SpO2 96%   BMI 18.73 kg/m  Assessment & Plan:   1. Iliotibial band syndrome, right leg   Needs PT - pt thinks she sched an appt with [facility] but is not sure and does not remember when it is.  Failed zanaflex 2mg  so try skelaxin. Only tried aspercreme once so will try some more along with massage and heat.   Gave hard rx for pt to try to get compounded top cream from [facility] in town - gabapentin, baclofen, cyclobenzaprine, bupivacaine, and diclofenac in it.  If to expensive, try faxing order to [facility] ([email or web address])   Meds ordered this encounter  Medications   . metaxalone (SKELAXIN) 800 MG tablet    Sig: Take 1 tablet (800 mg total) by mouth 4 (four) times daily as needed for muscle spasms.    Dispense:  60 tablet    Refill:  0    I personally performed the services described in this documentation, which was scribed in my presence. The recorded information has been reviewed and considered, and addended by me as needed.   [name], M.D.  [facility] [address] [place], [place] [address] [phone] phone [phone] fax  05/17/16 10:51 PM

## 2016-05-20 NOTE — Progress Notes (Signed)
She is scheduled with [facility] on [address] tomorrow, 11/28 at 9:30am. They stated that the pt is aware of the apt.

## 2016-05-21 DIAGNOSIS — J449 Chronic obstructive pulmonary disease, unspecified: Secondary | ICD-10-CM | POA: Diagnosis not present

## 2016-05-23 ENCOUNTER — Encounter: Payer: Medicare Other | Admitting: Family Medicine

## 2016-05-28 ENCOUNTER — Ambulatory Visit (INDEPENDENT_AMBULATORY_CARE_PROVIDER_SITE_OTHER): Payer: Medicare Other | Admitting: Family Medicine

## 2016-05-28 VITALS — BP 118/76 | HR 90 | Temp 98.5°F | Resp 16 | Ht 64.0 in | Wt 107.8 lb

## 2016-05-28 DIAGNOSIS — Z5181 Encounter for therapeutic drug level monitoring: Secondary | ICD-10-CM | POA: Diagnosis not present

## 2016-05-28 DIAGNOSIS — F5101 Primary insomnia: Secondary | ICD-10-CM | POA: Diagnosis not present

## 2016-05-28 DIAGNOSIS — T391X1A Poisoning by 4-Aminophenol derivatives, accidental (unintentional), initial encounter: Secondary | ICD-10-CM | POA: Diagnosis not present

## 2016-05-28 DIAGNOSIS — F4323 Adjustment disorder with mixed anxiety and depressed mood: Secondary | ICD-10-CM | POA: Diagnosis not present

## 2016-05-28 DIAGNOSIS — F4320 Adjustment disorder, unspecified: Secondary | ICD-10-CM

## 2016-05-28 DIAGNOSIS — G894 Chronic pain syndrome: Secondary | ICD-10-CM

## 2016-05-28 DIAGNOSIS — F4321 Adjustment disorder with depressed mood: Secondary | ICD-10-CM

## 2016-05-28 DIAGNOSIS — M7631 Iliotibial band syndrome, right leg: Secondary | ICD-10-CM

## 2016-05-28 DIAGNOSIS — F411 Generalized anxiety disorder: Secondary | ICD-10-CM | POA: Diagnosis not present

## 2016-05-28 MED ORDER — HYDROCODONE-ACETAMINOPHEN 10-325 MG PO TABS: 1.0000 | ORAL_TABLET | Freq: Four times a day (QID) | ORAL | 0 refills | Status: DC | PRN

## 2016-05-28 MED ORDER — ALPRAZOLAM 1 MG PO TABS
ORAL_TABLET | ORAL | 0 refills | Status: DC
Start: 2016-05-28 — End: 2016-06-22

## 2016-05-28 MED ORDER — ALPRAZOLAM 1 MG PO TABS: ORAL_TABLET | ORAL | 0 refills | Status: DC

## 2016-05-28 NOTE — Patient Instructions (Addendum)
Contact Tammy Chaput at [phone] or [email or web address] for more information or to register for the hospice grieving individual or group counseling.   Acetaminophen Overdose When acetaminophen is used as directed, it is a very safe and effective medicine that can help relieve pain or fever. However, when taken in large doses, it can lead to an acetaminophen overdose. An overdose of this medicine can lead to serious problems, such as liver damage and death. The maximum recommended dosage of acetaminophen when taken out of the hospital is 3,000 mg a day in adults, or 5 doses a day in children. Acetaminophen is available in tablets that you can swallow, chew, or dissolve on your tongue. It is also available as a liquid or suppository. All types can be purchased over the counter. Acetaminophen is found in many other over-the-counter medicines, such as medicines for cough, cold, and flu. Acetaminophen is also contained in many prescription-strength pain medicines. Check all medicine labels for the presence and dosage of acetaminophen. Medicine labels may use abbreviations or other names for acetaminophen, such as APAP, AC, or Acetamin. One common brand name of acetaminophen is Tylenol. What are the causes? An acetaminophen overdose is caused by taking a larger-than-recommended dose of acetaminophen. What are the signs or symptoms? Symptoms may not appear for hours or even days following the actual overdose. Symptoms may include:  Fatigue.  Abdominal pain.  Nausea or vomiting.  Confusion.  Loss of appetite.  Unexplained sweating.  Enlarged liver.  Yellowing of the skin (jaundice).  Convulsions.  Coma. How is this diagnosed? Your health care provider will perform a physical exam and ask about your medical history. Testing may be required and may include:  Blood tests to determine how much acetaminophen is in your blood.  Blood tests to look for liver problems.  Close  monitoring of your blood pressure, pulse, temperature, and breathing.  Ultrasound or CT scan of your abdomen. How is this treated? Your treatment will depend on your test results and may include:  Emptying your stomach.  Giving you activated charcoal by mouth.  Using medicines to treat your symptoms or reverse the effect of the acetaminophen. Follow these instructions at home: After you have been examined and treated by your health care provider:  Take medicines only as directed by your health care provider. Avoid any medicines that contain acetaminophen.  Follow up with your health care provider as directed. This is very important. You may need follow-up blood tests to check your liver.  Do not drink alcohol.  Drink enough fluid to keep your urine clear or pale yellow. Get help right away if:  There has been an intentional or accidental ingestion of acetaminophen. A severe overdose may represent a serious problem that is an emergency. Do not wait to see if the symptoms will go away. Get medical help right away. Call your local emergency services (911 in the [place] Do not drive yourself to the hospital. If an overdose is suspected, you can also call the [facility] at [phone]. This information is not intended to replace advice given to you by your health care provider. Make sure you discuss any questions you have with your health care provider. Document Released: 03/25/2014 Document Revised: 11/22/2015 Document Reviewed: 10/20/2013 Elsevier Interactive Patient Education  2017 [employer].

## 2016-05-28 NOTE — Progress Notes (Signed)
She had rescheduled for the 1st of December but missed that apt as well. They have been trying to get in touch with her to reschedule and stated that they will give her another call this afternoon to set up an apt.

## 2016-05-28 NOTE — Progress Notes (Addendum)
By signing my name below, I, [name], attest that this documentation has been prepared under the direction and in the presence of [name], MD.  Electronically Signed: Verlee [name], Medical Scribe. 05/28/16. 12:52 PM.  Subjective:    Patient ID: [name], female    DOB: 25-Mar-1951, 65 y.o.   MRN: [id number]  HPI Chief Complaint  Patient presents with  . Leg Pain    X 2 WEEKS  . Medication Refill    Xanax, Hydrocodone    HPI Comments: [name] is a 65 y.o. female who presents to the [facility] for medication refill for xanax and hydrocodone. Pt wanted to refill her Rx before Dr. Brigitte Pulse is out of town and would be unable to refill it. Pt just refilled her xanax and hydrocodone Rx 3 days ago and she is now left with 12 hydrocodone tablets and 1 xanax tablet left. She reports crying from her leg pain. Pt has been using hydrocodone for her leg and her stomach pain. Pt missed her PT appt 11/28 and she rescheduled it to next week. Denies constipation, trouble urinating, and dec appetite.  Anxiety: Pt now smokes 1 - 0.5 packs per day which is less than she's used to. Pt went to visit her mom today and she's afraid her mom's not going to make it to Christmas. Her mom is on hospice of Alzheimer's dementia. Pt would like to speak with hospice for counseling about her mom.  Breathing: Compliant with cymbalta and her breathing has been fine. She has been wearing her oxygen all night, but takes it off when she wakes up.  Patient Active Problem List   Diagnosis Date Noted  . Abdominal pain   . Hypoxia 04/08/2015  . Opioid use with withdrawal ([place])   . Protein-calorie malnutrition ([place])   . Episode of recurrent major depressive disorder ([place])   . Secondary hyperparathyroidism ([place]) 01/10/2015  . Vitamin D deficiency 01/10/2015  . 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 Anemia, iron deficiency 11/03/2014  . Chronic pain syndrome 10/24/2014  .  Osteoporosis 10/05/2014  . Vertebral compression fracture ([place]) 10/05/2014  . Coronary artery disease due to calcified coronary lesion 10/05/2014  . Weight loss   . Tobacco abuse   . History of subtotal gastrectomy with Roux-en-Y recontruction   . Orthostatic hypotension 08/11/2014  . Severe protein-calorie malnutrition ([place]) 12/30/2013  . Constipation, chronic 07/10/2013  . Chronic abdominal pain   . COPD (chronic obstructive pulmonary disease) ([place]) 11/07/2010  . Major depressive disorder, recurrent episode, moderate ([place]) 06/06/2010  . 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 Anxiety state 01/07/2007  . Barrett's esophagus 07/03/2005   Past Medical History:  Diagnosis Date  . ACUTE DUODEN ULCER W/HEMORR PERF&OBSTRUCTION 06/26/2004   Qualifier: Diagnosis of  By: [name] MD, [name]   . Acute duodenal ulcer with hemorrhage and perforation, with obstruction ([place])   . Allergy   . Anemia   . Anginal pain ([place])    "many many years ago"  . Anxiety    sees [name] NP at Dr. Radonna Ricker office  . Arthritis    "hips" (09/06/2014)  . Asthma   . Barrett's esophagus   . Chronic abdominal pain    narcotic dependence, dr gyarteng-dak at [facility]  . 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 Complication of anesthesia    pt states had too much xanax on board - agitated   . COPD (chronic obstructive  pulmonary disease) ([place])   . Depression   . Exertional shortness of breath   . FEVER, RECURRENT 08/11/2009   Qualifier: Diagnosis of  By: [name] MD, [name]   . Fx of fibula 07-28-11   left fibula, 2 places   . Fx two ribs-open 08-24-11   left 4th and 5th  . GASTRIC OUTLET OBSTRUCTION 08/28/2007   In July 2008 she underwent laparoscopic enterolysis, Nissen fundoplication over a [id number] bougie, single pledgeted suture colosure of the hiatus and a 3 suture wrap.  Lap truncal vagotomy and a loop gastrojejunostomy was performed.  This was revised in August of 2009 to a roux en Y  gastrojejujostomy.     . Gastroparesis 06/03/2007   Qualifier: Diagnosis of  By: [name] MD, [name]   . GERD (gastroesophageal reflux disease)   . Headache    "monthly" (09/06/2014)  . History of blood transfusion    "related to OR; maybe when I perforated that ulcer"  . History of hiatal hernia   . Lap Nissen + truncal vagotomy July 2008 05/13/2013  . LEG EDEMA 01/19/2008   Qualifier: Diagnosis of  By: [name] MD, [name]   . Menopause   . MENOPAUSE 01/07/2007   Qualifier: Diagnosis of  By: [name], CMA, [place]    . Myocardial infarction   . Narcotic abuse   . Peptic ulcer disease    dr Oletta Lamas  . Pneumonia, organism unspecified(486) 11/07/2010   Assoc with R Parapneumonic effusion 09/2010    - Tapped 10/05/10    - CxR resolved 11/07/2010    . S/P jejunostomy Feb 2016 07/26/2014   Past Surgical History:  Procedure Laterality Date  . BIOPSY THYROID  08-13-11   benign nodule, per Dr. Melida Quitter   . CATARACT EXTRACTION W/ INTRAOCULAR LENS  IMPLANT, BILATERAL Bilateral   . COLONOSCOPY    . DILATION AND CURETTAGE OF UTERUS    . ESOPHAGOGASTRODUODENOSCOPY  12-29-09   dr qadeer at [facility], several gastric ulcers  . ESOPHAGOGASTRODUODENOSCOPY N/A 09/25/2012   Procedure: ESOPHAGOGASTRODUODENOSCOPY (EGD);  Surgeon: Beryle Beams, MD;  Location: [facility];  Service: Endoscopy;  Laterality: N/A;  . FRACTURE SURGERY    . GASTROJEJUNOSTOMY  02-20-08   Roux en Y gastrojejunsotomy w 1 foot Roux limb  . GASTROSTOMY N/A 07/26/2014   Procedure: LAPRASCOPIC ASSISTED OPEN PLACEMENT OF JEJUNOSTOMY TUBE;  Surgeon: Pedro Earls, MD;  Location: [facility];  Service: General;  Laterality: N/A;  . HERNIA REPAIR     "hiatal"  . JOINT REPLACEMENT    . LAPAROSCOPIC NISSEN FUNDOPLICATION    . LAPAROSCOPIC PARTIAL GASTRECTOMY N/A 06/29/2013   Procedure: Gastrectomy and GJ Tube placement;  Surgeon: Pedro Earls, MD;  Location: [facility];  Service: General;  Laterality: N/A;  . ORIF HIP FRACTURE Bilateral    "pins in  both of them"  . REPAIR OF PERFORATED ULCER    . TONSILLECTOMY     Allergies  Allergen Reactions  . Lithium Nausea And Vomiting  . Celebrex [Celecoxib] Other (See Comments)    History of bleeding ulcers  . Morphine Itching  . Quetiapine Other (See Comments)     tardive dyskinesia  . Varenicline Tartrate Other (See Comments)    hallucinations   Prior to Admission medications   Medication Sig Start Date End Date Taking? Authorizing Provider  albuterol (PROVENTIL HFA;VENTOLIN HFA) 108 (90 Base) MCG/ACT inhaler Inhale 2 puffs into the lungs every 4 (four) hours as needed (cough, shortness of breath or wheezing.). 08/01/15  Yes Shawnee Knapp, MD  ALPRAZolam [name]) 0.5 MG tablet Take 1-2 tablets (0.5-1 mg total) by mouth 3 (three) times daily as needed for anxiety. Ok to fill today 03/28/16 03/28/16  Yes Shawnee Knapp, MD  ALPRAZolam [name]) 1 MG tablet Take 1 tabs po qd prn panic attack and 2 tabs po qhs prn sleep. This is a 1 week supply. May fill Saturday 05/11/2016 05/04/16  Yes Shawnee Knapp, MD  ALPRAZolam [name]) 1 MG tablet Take 1 tabs po qd prn panic attack and 2 tabs po qhs prn sleep. This is a 1 week supply. May fill Saturday 05/18/16 05/04/16  Yes Shawnee Knapp, MD  ALPRAZolam [name]) 1 MG tablet Take 1 tabs po qd prn panic attack and 2 tabs po qhs prn sleep. This is a 1 week supply. May fill Saturday 05/25/2016 05/04/16  Yes Shawnee Knapp, MD  ALPRAZolam [name]) 1 MG tablet Take 1 tabs po qd prn panic attack and 2 tabs po qhs prn sleep. This is a 1 week supply. May fill Saturday 12/09 05/04/16  Yes Shawnee Knapp, MD  Alum & Mag Hydroxide-Simeth (GI COCKTAIL) SUSP suspension Take 30 mLs by mouth 3 (three) times daily as needed for indigestion. Shake well. 02/15/16  Yes Shawnee Knapp, MD  DULoxetine (CYMBALTA) 60 MG capsule TAKE (1) CAPSULE DAILY. 05/13/16  Yes Shawnee Knapp, MD  feeding supplement (BOOST / RESOURCE BREEZE) LIQD Take 1 Container by mouth 3 (three) times daily between meals. 12/16/15  Yes Shawnee Knapp,  MD  furosemide (LASIX) 20 MG tablet TAKE 1 TABLET ONCE DAILY AS NEEDED FOR EDEMA. 04/19/16  Yes Shawnee Knapp, MD  HYDROcodone-acetaminophen (NORCO) 10-325 MG tablet Take 1-2 tablets by mouth every 6 (six) hours as needed. This is a 1 week supply. Ok to fill Saturday 05/25/16 05/04/16  Yes Shawnee Knapp, MD  HYDROcodone-acetaminophen [facility]) 10-325 MG tablet Take 1-2 tablets by mouth every 6 (six) hours as needed for moderate pain. This is a 1 week supply. Ok to fill Saturday 05/11/2016 05/04/16  Yes Shawnee Knapp, MD  HYDROcodone-acetaminophen [facility]) 10-325 MG tablet Take 1-2 tablets by mouth every 6 (six) hours as needed. This is a 1 week supply. Ok to fill Saturday 05/18/2016 05/04/16  Yes Shawnee Knapp, MD  HYDROcodone-acetaminophen [facility]) 10-325 MG tablet Take 1-2 tablets by mouth every 6 (six) hours as needed. This is a 1 week supply. Ok to fill Saturday 06/01/16 05/04/16  Yes Shawnee Knapp, MD  ipratropium (ATROVENT) 0.03 % nasal spray Place 2 sprays into both nostrils 2 (two) times daily. 10/20/15  Yes Posey Boyer, MD  ipratropium-albuterol (DUONEB) 0.5-2.5 (3) MG/3ML SOLN Take 3 mLs by nebulization 4 (four) times daily -  before meals and at bedtime. 06/03/15  Yes Robyn Haber, MD  metaxalone (SKELAXIN) 800 MG tablet Take 1 tablet (800 mg total) by mouth 4 (four) times daily as needed for muscle spasms. 05/17/16  Yes Shawnee Knapp, MD  OLANZapine (ZYPREXA) 20 MG tablet Take 1 tablet (20 mg total) by mouth at bedtime. 04/12/16  Yes Shawnee Knapp, MD  omeprazole (PRILOSEC) 40 MG capsule TAKE ONE CAPSULE BY MOUTH TWICE DAILY 10/10/15  Yes Shawnee Knapp, MD  ondansetron (ZOFRAN ODT) 4 MG disintegrating tablet Take 1-2 tablets (4-8 mg total) by mouth every 8 (eight) hours as needed for nausea or vomiting. 04/04/16  Yes Shawnee Knapp, MD  trolamine salicylate (ASPERCREME) 10 % cream Apply 1 application  topically as needed for muscle pain. 05/10/16  Yes Shawnee Knapp, MD  UNABLE TO FIND Place 1 application into the nose  once. Med Name: best Fit evaluation for oxygen, possibly simply go mini 08/18/15  Yes Mancel Bale, PA-C  UNABLE TO FIND Med Name: Best fit evaluation for oxygen - possible simply go mini 08/18/15  Yes Mancel Bale, PA-C   Social History   Social History  . Marital status: Divorced    Spouse name: N/A  . Number of children: N/A  . Years of education: N/A   Occupational History  . Not on file.   Social History Main Topics  . Smoking status: Current Every Day Smoker    Packs/day: 2.00    Years: 50.00    Types: Cigarettes  . Smokeless tobacco: Never Used  . Alcohol use No  . Drug use: No  . Sexual activity: No   Other Topics Concern  . Not on file   Social History Narrative  . No narrative on file   Review of Systems  Constitutional: Negative for appetite change.  Gastrointestinal: Negative for constipation.  Genitourinary: Negative for difficulty urinating.  Psychiatric/Behavioral: The patient is nervous/anxious.    Objective:  Physical Exam  Constitutional: She appears well-developed and well-nourished. No distress.  HENT:  Head: Normocephalic and atraumatic.  Eyes: Conjunctivae are normal.  Neck: Neck supple.  Cardiovascular: Normal rate.   Pulmonary/Chest: Effort normal.  Neurological: She is alert.  Skin: Skin is warm and dry.  Psychiatric: She has a normal mood and affect. Her behavior is normal.  Nursing note and vitals reviewed.  BP 118/76 (BP Location: Right Arm, Patient Position: Sitting, Cuff Size: Small)   Pulse 90   Temp 98.5 F (36.9 C) (Oral)   Resp 16   Ht 5\' 4"  (1.626 m)   Wt 107 lb 12.8 oz (48.9 kg)   SpO2 94% Comment: pt oxygen went to 84  BMI 18.50 kg/m   Assessment & Plan:   1. Chronic pain syndrome -pt states she is using so many more narcotics due to her IT band pain in addition to her chronic abdominal pain but again reminded that that is not an option for her, needs to stick to sig.   2. Medication monitoring encounter   3. Primary  insomnia   4. Grief   5. Acetaminophen overdose, accidental or unintentional, initial encounter - If pt has been taking her hydrocodone (rather than diverting perhaps unintentionally) - then she has been taking 5.2g qd x 3d.  Pt has 12 hydrocodone left to take the rest of the week - will get a refill on Sat 12/9  6. Adjustment disorder with mixed anxiety and depressed mood - pt upset over her mother's end-stage dementia and is already grieving but really fearing her mother's passing. Her mother is on hospice so advised making an appt with hospice for individual or group counseling  - pt agreeable to doing this (though I don't think she ever will.   Anxiety - Need to decrease xanax due to short-term memory loss - will bump down from tid to bid and plan to cont wean again. Pt REALLY doesn't want to but the fact that she can't remember whether she took any pain or anxiety pills yet today nor how many is highly concerning.  IT band syndrome - Pt already missed her initial PT appt at [facility] on 11/28 and again when she had rescheduled on 12/1. Since then she has not returned their  calls but they are going to try again to contact pt and scheule today - pt states she has rescheduled but has no idea when appt is.  Orders Placed This Encounter  Procedures  . Comprehensive metabolic panel  . Acetaminophen level  . ToxASSURE Select 13 (MW), Urine    Meds ordered this encounter  Medications  . ALPRAZolam (XANAX) 1 MG tablet    Sig: Take 1-2 tabs po qhs prn sleep. This is a 1 week supply. May fill Saturday 06/08/2016    Dispense:  14 tablet    Refill:  0  . ALPRAZolam (XANAX) 1 MG tablet    Sig: Take 1-2 tabs po qhs prn sleep. This is a 1 week supply. May fill Saturday 06/15/16    Dispense:  14 tablet    Refill:  0  . ALPRAZolam (XANAX) 1 MG tablet    Sig: Take 1-2 tabs po qhs prn sleep. This is a 1 week supply. May fill Saturday 06/22/2016    Dispense:  14 tablet    Refill:  0  . ALPRAZolam  (XANAX) 1 MG tablet    Sig: Take 1-2 tabs po qhs prn sleep. This is a 1 week supply. May fill Saturday 1/06    Dispense:  14 tablet    Refill:  0  . HYDROcodone-acetaminophen (NORCO) 10-325 MG tablet    Sig: Take 1-2 tablets by mouth every 6 (six) hours as needed. This is a 1 week supply. Ok to fill Saturday 06/08/16    Dispense:  60 tablet    Refill:  0  . HYDROcodone-acetaminophen (NORCO) 10-325 MG tablet    Sig: Take 1-2 tablets by mouth every 6 (six) hours as needed for moderate pain. This is a 1 week supply. Ok to fill Saturday 06/15/2016    Dispense:  60 tablet    Refill:  0  . HYDROcodone-acetaminophen (NORCO) 10-325 MG tablet    Sig: Take 1-2 tablets by mouth every 6 (six) hours as needed. This is a 1 week supply. Ok to fill Saturday 06/22/2016    Dispense:  60 tablet    Refill:  0  . HYDROcodone-acetaminophen (NORCO) 10-325 MG tablet    Sig: Take 1-2 tablets by mouth every 6 (six) hours as needed. This is a 1 week supply. Ok to fill Saturday 06/29/16    Dispense:  60 tablet    Refill:  0    I personally performed the services described in this documentation, which was scribed in my presence. The recorded information has been reviewed and considered, and addended by me as needed.   [name], M.D.  [facility] [name] [place], [place] [address] [phone] phone [phone] fax  05/28/16 1:58 PM

## 2016-05-29 MED ORDER — FUROSEMIDE 20 MG PO TABS: ORAL_TABLET | ORAL | 0 refills | Status: DC

## 2016-05-30 DIAGNOSIS — J449 Chronic obstructive pulmonary disease, unspecified: Secondary | ICD-10-CM | POA: Diagnosis not present

## 2016-06-04 LAB — COMPREHENSIVE METABOLIC PANEL
ALBUMIN: 4.1 g/dL (ref 3.6–4.8)
ALT: 13 IU/L (ref 0–32)
AST: 16 IU/L (ref 0–40)
Albumin/Globulin Ratio: 2.1 (ref 1.2–2.2)
Alkaline Phosphatase: 124 IU/L — ABNORMAL HIGH (ref 39–117)
BUN / CREAT RATIO: 18 (ref 12–28)
BUN: 11 mg/dL (ref 8–27)
Bilirubin Total: 0.2 mg/dL (ref 0.0–1.2)
CALCIUM: 8.9 mg/dL (ref 8.7–10.3)
CO2: 27 mmol/L (ref 18–29)
CREATININE: 0.61 mg/dL (ref 0.57–1.00)
Chloride: 96 mmol/L (ref 96–106)
GFR, EST AFRICAN AMERICAN: 111 mL/min/{1.73_m2} (ref >59)
GFR, EST NON AFRICAN AMERICAN: 96 mL/min/{1.73_m2} (ref >59)
GLOBULIN, TOTAL: 2 g/dL (ref 1.5–4.5)
Glucose: 95 mg/dL (ref 65–99)
Potassium: 4.5 mmol/L (ref 3.5–5.2)
SODIUM: 141 mmol/L (ref 134–144)
TOTAL PROTEIN: 6.1 g/dL (ref 6.0–8.5)

## 2016-06-04 LAB — ACETAMINOPHEN LEVEL: Acetaminophen (Tylenol), S: 38 ug/mL — ABNORMAL HIGH (ref 10–30)

## 2016-06-04 LAB — TOXASSURE SELECT 13 (MW), URINE

## 2016-06-07 ENCOUNTER — Other Ambulatory Visit: Payer: Self-pay

## 2016-06-07 ENCOUNTER — Ambulatory Visit: Payer: Medicare Other

## 2016-06-07 NOTE — Telephone Encounter (Signed)
PA completed with Cover my meds - online

## 2016-06-07 NOTE — Telephone Encounter (Signed)
PA completed for Skelaxin/Metaxalone 800mg 

## 2016-06-10 ENCOUNTER — Ambulatory Visit: Payer: Medicare Other

## 2016-06-11 ENCOUNTER — Ambulatory Visit (INDEPENDENT_AMBULATORY_CARE_PROVIDER_SITE_OTHER): Payer: Medicare Other | Admitting: Family Medicine

## 2016-06-11 VITALS — BP 122/72 | HR 85 | Temp 97.4°F | Resp 17 | Ht 64.0 in | Wt 110.0 lb

## 2016-06-11 DIAGNOSIS — F4321 Adjustment disorder with depressed mood: Secondary | ICD-10-CM

## 2016-06-11 DIAGNOSIS — F411 Generalized anxiety disorder: Secondary | ICD-10-CM

## 2016-06-11 DIAGNOSIS — F4320 Adjustment disorder, unspecified: Secondary | ICD-10-CM | POA: Diagnosis not present

## 2016-06-11 DIAGNOSIS — G894 Chronic pain syndrome: Secondary | ICD-10-CM

## 2016-06-11 DIAGNOSIS — Z5181 Encounter for therapeutic drug level monitoring: Secondary | ICD-10-CM

## 2016-06-11 MED ORDER — METAXALONE 800 MG PO TABS: 800.0000 mg | ORAL_TABLET | Freq: Four times a day (QID) | ORAL | 0 refills | Status: DC | PRN

## 2016-06-11 MED ORDER — HYDROXYZINE HCL 50 MG PO TABS: 50.0000 mg | ORAL_TABLET | Freq: Three times a day (TID) | ORAL | 0 refills | Status: DC | PRN

## 2016-06-11 MED ORDER — DULOXETINE HCL 60 MG PO CPEP: ORAL_CAPSULE | ORAL | 0 refills | Status: DC

## 2016-06-11 NOTE — Patient Instructions (Addendum)
IF you received an x-ray today, you will receive an invoice from [facility]. Please contact [facility] at [phone] with questions or concerns regarding your invoice.   IF you received labwork today, you will receive an invoice from [facility]. Please contact [facility] at [phone] with questions or concerns regarding your invoice.   Our billing staff will not be able to assist you with questions regarding bills from these companies.  You will be contacted with the lab results as soon as they are available. The fastest way to get your results is to activate your My Chart account. Instructions are located on the last page of this paperwork. If you have not heard from [place] regarding the results in 2 weeks, please contact this office.      Complicated Grieving Introduction Grief is a normal response to the death of someone close to you. Feelings of fear, anger, and guilt can affect almost everyone who loses a loved one. It is also common to have symptoms of depression while you are grieving. These include problems with sleep, loss of appetite, and lack of energy. They may last for weeks or months after a loss. Complicated grief is different from normal grief or depression. Normal grieving involves sadness and feelings of loss, but these feelings are not constant. Complicated grief is a constant and severe type of grief. It interferes with your ability to function normally. It may last for several months to a year or longer. Complicated grief may require treatment from a mental health care provider. What are the causes? It is not known why some people continue to struggle with grief and others do not. You may be at higher risk for complicated grief if:  The death of your loved one was sudden or unexpected.  The death of your loved one was due to a violent event.  Your loved one committed suicide.  Your loved one was a child or a young person.  You were very close to  or dependent on the loved one.  You have a history of depression. What are the signs or symptoms? Signs and symptoms of complicated grief may include:  Feeling disbelief or numbness.  Being unable to enjoy good memories of your loved one.  Needing to avoid anything that reminds you of your loved one.  Being unable to stop thinking about the death.  Feeling intense anger or guilt.  Feeling alone and hopeless.  Feeling that your life is meaningless and empty.  Losing the desire to live. How is this diagnosed? Your health care provider may diagnose complicated grief if:  You have constant symptoms of grief for 6-12 months or longer.  Your symptoms are interfering with your ability to live your life. Your health care provider may want you to see a mental health care provider. Many symptoms of depression are similar to the symptoms of complicated grief. It is important to be evaluated for complicated grief along with other mental health conditions. How is this treated? Talk therapy with a mental health provider is the most common treatment for complicated grief. During therapy, you will learn healthy ways to cope with the loss of your loved one. In some cases, your mental health care provider may also recommend antidepressant medicines. Follow these instructions at home:  Take care of yourself.  Eat regular meals and maintain a healthy diet. Eat plenty of fruits, vegetables, and whole grains.  Try to get some exercise each day.  Keep regular hours for sleep.  Try to get at least 8 hours of sleep each night.  Do not use drugs or alcohol to ease your symptoms.  Take medicines only as directed by your health care provider.  Spend time with friends and loved ones.  Consider joining a grief (bereavement) support group to help you deal with your loss.  Keep all follow-up visits as directed by your health care provider. This is important. Contact a health care provider if:  Your  symptoms keep you from functioning normally.  Your symptoms do not get better with treatment. Get help right away if:  You have serious thoughts of hurting yourself or someone else.  You have suicidal feelings. This information is not intended to replace advice given to you by your health care provider. Make sure you discuss any questions you have with your health care provider. Document Released: 06/10/2005 Document Revised: 11/16/2015 Document Reviewed: 11/18/2013  2017 [employer]vier

## 2016-06-11 NOTE — Progress Notes (Signed)
Subjective:  By signing my name below, I, [name], attest that this documentation has been prepared under the direction and in the presence of [name] Cheadle, MD Electronically Signed: Ladene [name], ED Scribe 06/11/2016 at 12:20 PM.   Patient ID: [name], female    DOB: 1951/03/08, 65 y.o.   MRN: [id number]  Chief Complaint  Patient presents with  . Follow-up    medication    HPI HPI Comments: [name] is a 65 y.o. female who presents to the [facility] for a follow-up on medication.   Anxiety/Depression  Pt states that her mother had another stroke and she suspects that she is going to pass any time now. She does not think that her mother is going to make it to Christmas. Pt is taking Xanax in the morning and 1 at night. Pt has been compliant with Cymbalta.   Sleep She denies insomnia and tries not to nap during the day. However, she has noticed night sweats over the past few nights and states that she has had to change her pajamas multiple times.   Leg Pain Pt states that she never followed up with physical therapy since her leg pain resolved.  Past Medical History:  Diagnosis Date  . ACUTE DUODEN ULCER W/HEMORR PERF&OBSTRUCTION 06/26/2004   Qualifier: Diagnosis of  By: [name] MD, [name]   . Acute duodenal ulcer with hemorrhage and perforation, with obstruction ([place])   . Allergy   . Anemia   . Anginal pain ([place])    "many many years ago"  . Anxiety    sees [name] NP at Dr. Radonna Ricker office  . Arthritis    "hips" (09/06/2014)  . Asthma   . Barrett's esophagus   . Chronic abdominal pain    narcotic dependence, dr gyarteng-dak at [facility]  . 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 Complication of anesthesia    pt states had too much xanax on board - agitated   . COPD (chronic obstructive pulmonary disease) ([place])   . Depression   . Exertional shortness of breath   . FEVER, RECURRENT 08/11/2009   Qualifier: Diagnosis of  By: [name] MD, [name]   . Fx of fibula 07-28-11   left fibula, 2 places   . Fx two ribs-open 08-24-11   left 4th and 5th  . GASTRIC OUTLET OBSTRUCTION 08/28/2007   In July 2008 she underwent laparoscopic enterolysis, Nissen fundoplication over a [id number] bougie, single pledgeted suture colosure of the hiatus and a 3 suture wrap.  Lap truncal vagotomy and a loop gastrojejunostomy was performed.  This was revised in August of 2009 to a roux en Y gastrojejujostomy.     . Gastroparesis 06/03/2007   Qualifier: Diagnosis of  By: [name] MD, [name]   . GERD (gastroesophageal reflux disease)   . Headache    "monthly" (09/06/2014)  . History of blood transfusion    "related to OR; maybe when I perforated that ulcer"  . History of hiatal hernia   . Lap Nissen + truncal vagotomy July 2008 05/13/2013  . LEG EDEMA 01/19/2008   Qualifier: Diagnosis of  By: [name] MD, [name]   . Menopause   . MENOPAUSE 01/07/2007   Qualifier: Diagnosis of  By: [name], CMA, [place]    . Myocardial infarction   . Narcotic abuse   . Peptic ulcer disease    dr Oletta Lamas  . Pneumonia, organism unspecified(486) 11/07/2010  Assoc with R Parapneumonic effusion 09/2010    - Tapped 10/05/10    - CxR resolved 11/07/2010    . S/P jejunostomy Feb 2016 07/26/2014   Current Outpatient Prescriptions on File Prior to Visit  Medication Sig Dispense Refill  . albuterol (PROVENTIL HFA;VENTOLIN HFA) 108 (90 Base) MCG/ACT inhaler Inhale 2 puffs into the lungs every 4 (four) hours as needed (cough, shortness of breath or wheezing.). 1 Inhaler 3  . ALPRAZolam (XANAX) 0.5 MG tablet Take 1-2 tablets (0.5-1 mg total) by mouth 3 (three) times daily as needed for anxiety. Ok to fill today 03/28/16 30 tablet 0  . ALPRAZolam (XANAX) 1 MG tablet Take 1-2 tabs po qhs prn sleep. This is a 1 week supply. May fill Saturday 06/08/2016 14 tablet 0  . ALPRAZolam (XANAX) 1 MG tablet Take 1-2 tabs po qhs prn sleep. This is a 1 week supply. May fill  Saturday 06/15/16 14 tablet 0  . ALPRAZolam (XANAX) 1 MG tablet Take 1-2 tabs po qhs prn sleep. This is a 1 week supply. May fill Saturday 06/22/2016 14 tablet 0  . ALPRAZolam (XANAX) 1 MG tablet Take 1-2 tabs po qhs prn sleep. This is a 1 week supply. May fill Saturday 1/06 14 tablet 0  . Alum & Mag Hydroxide-Simeth (GI COCKTAIL) SUSP suspension Take 30 mLs by mouth 3 (three) times daily as needed for indigestion. Shake well. 600 mL 5  . DULoxetine (CYMBALTA) 60 MG capsule TAKE (1) CAPSULE DAILY. 30 capsule 0  . feeding supplement (BOOST / RESOURCE BREEZE) LIQD Take 1 Container by mouth 3 (three) times daily between meals. 90 Container 11  . furosemide (LASIX) 20 MG tablet TAKE 1 TABLET ONCE DAILY AS NEEDED FOR EDEMA. 30 tablet 0  . HYDROcodone-acetaminophen (NORCO) 10-325 MG tablet Take 1-2 tablets by mouth every 6 (six) hours as needed. This is a 1 week supply. Ok to fill Saturday 06/08/16 60 tablet 0  . HYDROcodone-acetaminophen (NORCO) 10-325 MG tablet Take 1-2 tablets by mouth every 6 (six) hours as needed for moderate pain. This is a 1 week supply. Ok to fill Saturday 06/15/2016 60 tablet 0  . HYDROcodone-acetaminophen (NORCO) 10-325 MG tablet Take 1-2 tablets by mouth every 6 (six) hours as needed. This is a 1 week supply. Ok to fill Saturday 06/22/2016 60 tablet 0  . HYDROcodone-acetaminophen (NORCO) 10-325 MG tablet Take 1-2 tablets by mouth every 6 (six) hours as needed. This is a 1 week supply. Ok to fill Saturday 06/29/16 60 tablet 0  . ipratropium (ATROVENT) 0.03 % nasal spray Place 2 sprays into both nostrils 2 (two) times daily. 30 mL 0  . ipratropium-albuterol (DUONEB) 0.5-2.5 (3) MG/3ML SOLN Take 3 mLs by nebulization 4 (four) times daily -  before meals and at bedtime. 360 mL 3  . metaxalone (SKELAXIN) 800 MG tablet Take 1 tablet (800 mg total) by mouth 4 (four) times daily as needed for muscle spasms. 60 tablet 0  . OLANZapine (ZYPREXA) 20 MG tablet Take 1 tablet (20 mg total) by  mouth at bedtime. 30 tablet 2  . omeprazole (PRILOSEC) 40 MG capsule TAKE ONE CAPSULE BY MOUTH TWICE DAILY 180 capsule 3  . ondansetron (ZOFRAN ODT) 4 MG disintegrating tablet Take 1-2 tablets (4-8 mg total) by mouth every 8 (eight) hours as needed for nausea or vomiting. 180 tablet 0  . trolamine salicylate (ASPERCREME) 10 % cream Apply 1 application topically as needed for muscle pain. 85 g 0  . UNABLE TO FIND Place 1  application into the nose once. Med Name: best Fit evaluation for oxygen, possibly simply go mini 1 Device 0  . UNABLE TO FIND Med Name: Best fit evaluation for oxygen - possible simply go mini 1 Device 0   No current facility-administered medications on file prior to visit.    Allergies  Allergen Reactions  . Lithium Nausea And Vomiting  . Celebrex [Celecoxib] Other (See Comments)    History of bleeding ulcers  . Morphine Itching  . Quetiapine Other (See Comments)     tardive dyskinesia  . Varenicline Tartrate Other (See Comments)    hallucinations   Review of Systems  Constitutional: Positive for diaphoresis.  Musculoskeletal: Negative for myalgias.  Psychiatric/Behavioral: Negative for sleep disturbance. The patient is nervous/anxious.       Objective:   Physical Exam  Constitutional: She is oriented to person, place, and time. She appears well-developed and well-nourished. No distress.  HENT:  Head: Normocephalic and atraumatic.  Eyes: Conjunctivae and EOM are normal.  Neck: Neck supple. No tracheal deviation present.  Cardiovascular: Normal rate, regular rhythm and normal heart sounds.   Pulmonary/Chest: Effort normal and breath sounds normal. No respiratory distress.  Musculoskeletal: Normal range of motion.  Neurological: She is alert and oriented to person, place, and time.  Skin: Skin is warm and dry.  Psychiatric: She has a normal mood and affect. Her behavior is normal.  Nursing note and vitals reviewed.  BP 122/72 (BP Location: Right Arm, Patient  Position: Sitting, Cuff Size: Small)   Pulse 85   Temp 97.4 F (36.3 C) (Oral)   Resp 17   Ht 5\' 4"  (1.626 m)   Wt 110 lb (49.9 kg)   SpO2 98%   BMI 18.88 kg/m     Assessment & Plan:   1. Medication monitoring encounter   2. Chronic pain syndrome   3. Grief   4. Generalized anxiety disorder      Meds ordered this encounter  Medications  . DULoxetine (CYMBALTA) 60 MG capsule    Sig: TAKE (1) CAPSULE DAILY.    Dispense:  30 capsule    Refill:  0  . DISCONTD: metaxalone (SKELAXIN) 800 MG tablet    Sig: Take 1 tablet (800 mg total) by mouth 4 (four) times daily as needed for muscle spasms.    Dispense:  60 tablet    Refill:  0  . hydrOXYzine (ATARAX/VISTARIL) 50 MG tablet    Sig: Take 1 tablet (50 mg total) by mouth 3 (three) times daily as needed for anxiety.    Dispense:  30 tablet    Refill:  0    I personally performed the services described in this documentation, which was scribed in my presence. The recorded information has been reviewed and considered, and addended by me as needed.   [name], M.D.  [facility] [address] [place], [place] [address] [phone] phone [phone] fax  06/24/16 11:02 PM

## 2016-06-11 NOTE — Telephone Encounter (Signed)
Skelaxin denied by Cover my meds.

## 2016-06-13 ENCOUNTER — Encounter: Payer: Medicare Other | Admitting: Family Medicine

## 2016-06-13 ENCOUNTER — Telehealth: Payer: Self-pay

## 2016-06-13 NOTE — Telephone Encounter (Signed)
Per pharmacy metaxalone is not covered and expensive, requests cheaper sub

## 2016-06-13 NOTE — Telephone Encounter (Signed)
I had sent in a PA for metaxalone yesterday, but have not heard back yet. The only two muscle relaxants on list from ins as preferred are cyclobenzaprine and tizanidine which notes suggest pt has tried I the past. The rest of meds are NSAIDS. It may be that if a cheaper alternative like Robaxin were Rxd, it might be covered or perhaps pt could pay out of pocket?

## 2016-06-14 NOTE — Telephone Encounter (Signed)
Just have the pharmacy cancel the rx. No need to inform pt. I suspect that pt will just forget about this.   If pt calls here or pharmacy requesting the muscle relaxant, lets try the robaxin 500mg  1-2 tabs qid prn spasm #60, no refills and she can use a goodrx card and not run it through her insurance

## 2016-06-18 MED ORDER — METHOCARBAMOL 500 MG PO TABS: 500.0000 mg | ORAL_TABLET | Freq: Four times a day (QID) | ORAL | 0 refills | Status: DC | PRN

## 2016-06-18 NOTE — Telephone Encounter (Signed)
Called pharm and they reported that pt has called them and asked for an alternative. She stated that the store has a good discount card for the Robaxin so I will sent it in and pharm will use their discount to get the cost low.

## 2016-06-19 ENCOUNTER — Ambulatory Visit: Payer: Medicare Other

## 2016-06-20 ENCOUNTER — Emergency Department (HOSPITAL_COMMUNITY): Payer: Medicare Other

## 2016-06-20 ENCOUNTER — Encounter (HOSPITAL_COMMUNITY): Payer: Self-pay | Admitting: Emergency Medicine

## 2016-06-20 ENCOUNTER — Ambulatory Visit: Payer: Medicare Other

## 2016-06-20 ENCOUNTER — Emergency Department (HOSPITAL_COMMUNITY)
Admission: EM | Admit: 2016-06-20 | Discharge: 2016-06-20 | Disposition: A | Discharge status: Home / Self Care | Payer: Medicare Other | Source: Home / Self Care | Attending: Emergency Medicine | Admitting: Emergency Medicine

## 2016-06-20 DIAGNOSIS — Z8711 Personal history of peptic ulcer disease: Secondary | ICD-10-CM | POA: Diagnosis not present

## 2016-06-20 DIAGNOSIS — I252 Old myocardial infarction: Secondary | ICD-10-CM | POA: Insufficient documentation

## 2016-06-20 DIAGNOSIS — Z9981 Dependence on supplemental oxygen: Secondary | ICD-10-CM | POA: Diagnosis not present

## 2016-06-20 DIAGNOSIS — R0602 Shortness of breath: Secondary | ICD-10-CM | POA: Diagnosis not present

## 2016-06-20 DIAGNOSIS — J441 Chronic obstructive pulmonary disease with (acute) exacerbation: Secondary | ICD-10-CM

## 2016-06-20 DIAGNOSIS — J9621 Acute and chronic respiratory failure with hypoxia: Secondary | ICD-10-CM | POA: Diagnosis not present

## 2016-06-20 DIAGNOSIS — Z8379 Family history of other diseases of the digestive system: Secondary | ICD-10-CM | POA: Diagnosis not present

## 2016-06-20 DIAGNOSIS — E876 Hypokalemia: Secondary | ICD-10-CM | POA: Diagnosis not present

## 2016-06-20 DIAGNOSIS — Z79899 Other long term (current) drug therapy: Secondary | ICD-10-CM

## 2016-06-20 DIAGNOSIS — Z720 Tobacco use: Secondary | ICD-10-CM

## 2016-06-20 DIAGNOSIS — D696 Thrombocytopenia, unspecified: Secondary | ICD-10-CM | POA: Diagnosis not present

## 2016-06-20 DIAGNOSIS — R069 Unspecified abnormalities of breathing: Secondary | ICD-10-CM | POA: Diagnosis not present

## 2016-06-20 DIAGNOSIS — K219 Gastro-esophageal reflux disease without esophagitis: Secondary | ICD-10-CM | POA: Diagnosis not present

## 2016-06-20 DIAGNOSIS — R109 Unspecified abdominal pain: Secondary | ICD-10-CM | POA: Diagnosis not present

## 2016-06-20 DIAGNOSIS — F17210 Nicotine dependence, cigarettes, uncomplicated: Secondary | ICD-10-CM | POA: Insufficient documentation

## 2016-06-20 DIAGNOSIS — R05 Cough: Secondary | ICD-10-CM | POA: Diagnosis not present

## 2016-06-20 DIAGNOSIS — Z823 Family history of stroke: Secondary | ICD-10-CM | POA: Diagnosis not present

## 2016-06-20 DIAGNOSIS — Z8051 Family history of malignant neoplasm of kidney: Secondary | ICD-10-CM | POA: Diagnosis not present

## 2016-06-20 DIAGNOSIS — G8929 Other chronic pain: Secondary | ICD-10-CM | POA: Diagnosis not present

## 2016-06-20 DIAGNOSIS — J449 Chronic obstructive pulmonary disease, unspecified: Secondary | ICD-10-CM | POA: Diagnosis not present

## 2016-06-20 DIAGNOSIS — Z808 Family history of malignant neoplasm of other organs or systems: Secondary | ICD-10-CM | POA: Diagnosis not present

## 2016-06-20 DIAGNOSIS — R110 Nausea: Secondary | ICD-10-CM | POA: Diagnosis not present

## 2016-06-20 LAB — CBC WITH DIFFERENTIAL/PLATELET
BASOS ABS: 0 10*3/uL (ref 0.0–0.1)
Basophils Relative: 1 %
EOS PCT: 5 %
Eosinophils Absolute: 0.3 10*3/uL (ref 0.0–0.7)
HEMATOCRIT: 34 % — AB (ref 36.0–46.0)
Hemoglobin: 11 g/dL — ABNORMAL LOW (ref 12.0–15.0)
LYMPHS ABS: 1.2 10*3/uL (ref 0.7–4.0)
LYMPHS PCT: 20 %
MCH: 30 pg (ref 26.0–34.0)
MCHC: 32.4 g/dL (ref 30.0–36.0)
MCV: 92.6 fL (ref 78.0–100.0)
MONO ABS: 0.5 10*3/uL (ref 0.1–1.0)
Monocytes Relative: 8 %
NEUTROS ABS: 3.9 10*3/uL (ref 1.7–7.7)
Neutrophils Relative %: 66 %
PLATELETS: 240 10*3/uL (ref 150–400)
RBC: 3.67 MIL/uL — ABNORMAL LOW (ref 3.87–5.11)
RDW: 13.9 % (ref 11.5–15.5)
WBC: 5.9 10*3/uL (ref 4.0–10.5)

## 2016-06-20 LAB — BASIC METABOLIC PANEL
Anion gap: 11 (ref 5–15)
BUN: 9 mg/dL (ref 6–20)
CALCIUM: 8.6 mg/dL — AB (ref 8.9–10.3)
CHLORIDE: 103 mmol/L (ref 101–111)
CO2: 28 mmol/L (ref 22–32)
CREATININE: 0.61 mg/dL (ref 0.44–1.00)
GFR calc Af Amer: >60 mL/min (ref >60)
GFR calc non Af Amer: >60 mL/min (ref >60)
Glucose, Bld: 108 mg/dL — ABNORMAL HIGH (ref 65–99)
Potassium: 3.3 mmol/L — ABNORMAL LOW (ref 3.5–5.1)
SODIUM: 142 mmol/L (ref 135–145)

## 2016-06-20 MED ORDER — SODIUM CHLORIDE 0.9 % IV SOLN
INTRAVENOUS | Status: DC
Start: 2016-06-20 — End: 2016-06-20
  Administered 2016-06-20: 11:00:00 via INTRAVENOUS

## 2016-06-20 MED ORDER — PREDNISONE 10 MG PO TABS: 10.0000 mg | ORAL_TABLET | Freq: Once | ORAL | 0 refills | Status: AC

## 2016-06-20 MED ORDER — BENZONATATE 100 MG PO CAPS
100.0000 mg | ORAL_CAPSULE | Freq: Once | ORAL | Status: AC
  Administered 2016-06-20: 100 mg via ORAL
  Filled 2016-06-20: qty 1

## 2016-06-20 MED ORDER — LEVOFLOXACIN 500 MG PO TABS: 500.0000 mg | ORAL_TABLET | Freq: Every day | ORAL | 0 refills | Status: DC

## 2016-06-20 MED ORDER — BENZONATATE 100 MG PO CAPS: 200.0000 mg | ORAL_CAPSULE | Freq: Three times a day (TID) | ORAL | 0 refills | Status: DC | PRN

## 2016-06-20 NOTE — ED Notes (Signed)
Ambulated patient to the bathroom, patient became disoriented and short of breath upon returning to her room. O2 reapplied at 2L and O2 saturation reading at 97%

## 2016-06-20 NOTE — ED Provider Notes (Signed)
[facility] DEPT Provider Note   CSN: [id number] Arrival date & time: 06/20/16  1015     History   Chief Complaint Chief Complaint  Patient presents with  . Shortness of Breath  . Cough    HPI [name] is a 65 y.o. female.  She presents for evaluation of cough, and improved with home treatments, including albuterol, and oxygen. She denies nausea, vomiting, fever, chills. Her cough is productive of white sputum. No recent exacerbations of COPD. No nausea, vomiting, weakness or dizziness. She presented by EMS following treatment with nebulizer Solu- Medrol and oxygen. There are no other known modifying factors.  HPI  Past Medical History:  Diagnosis Date  . ACUTE DUODEN ULCER W/HEMORR PERF&OBSTRUCTION 06/26/2004   Qualifier: Diagnosis of  By: [name] MD, [name]   . Acute duodenal ulcer with hemorrhage and perforation, with obstruction ([place])   . Allergy   . Anemia   . Anginal pain ([place])    "many many years ago"  . Anxiety    sees [name] NP at Dr. Radonna Ricker office  . Arthritis    "hips" (09/06/2014)  . Asthma   . Barrett's esophagus   . Chronic abdominal pain    narcotic dependence, dr gyarteng-dak at [facility]  . 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 Complication of anesthesia    pt states had too much xanax on board - agitated   . COPD (chronic obstructive pulmonary disease) ([place])   . Depression   . Exertional shortness of breath   . FEVER, RECURRENT 08/11/2009   Qualifier: Diagnosis of  By: [name] MD, [name]   . Fx of fibula 07-28-11   left fibula, 2 places   . Fx two ribs-open 08-24-11   left 4th and 5th  . GASTRIC OUTLET OBSTRUCTION 08/28/2007   In July 2008 she underwent laparoscopic enterolysis, Nissen fundoplication over a [id number] bougie, single pledgeted suture colosure of the hiatus and a 3 suture wrap.  Lap truncal vagotomy and a loop gastrojejunostomy was performed.  This was revised in August of 2009 to a roux en Y  gastrojejujostomy.     . Gastroparesis 06/03/2007   Qualifier: Diagnosis of  By: [name] MD, [name]   . GERD (gastroesophageal reflux disease)   . Headache    "monthly" (09/06/2014)  . History of blood transfusion    "related to OR; maybe when I perforated that ulcer"  . History of hiatal hernia   . Lap Nissen + truncal vagotomy July 2008 05/13/2013  . LEG EDEMA 01/19/2008   Qualifier: Diagnosis of  By: [name] MD, [name]   . Menopause   . MENOPAUSE 01/07/2007   Qualifier: Diagnosis of  By: [name], CMA, [place]    . Myocardial infarction   . Narcotic abuse   . Peptic ulcer disease    dr Oletta Lamas  . Pneumonia, organism unspecified(486) 11/07/2010   Assoc with R Parapneumonic effusion 09/2010    - Tapped 10/05/10    - CxR resolved 11/07/2010    . S/P jejunostomy Feb 2016 07/26/2014    Patient Active Problem List   Diagnosis Date Noted  . Abdominal pain   . Hypoxia 04/08/2015  . Opioid use with withdrawal ([place])   . Protein-calorie malnutrition ([place])   . Episode of recurrent major depressive disorder ([place])   . Secondary hyperparathyroidism ([place]) 01/10/2015  . Vitamin D deficiency 01/10/2015  . 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 Anemia, iron deficiency 11/03/2014  .  Chronic pain syndrome 10/24/2014  . Osteoporosis 10/05/2014  . Vertebral compression fracture ([place]) 10/05/2014  . Coronary artery disease due to calcified coronary lesion 10/05/2014  . Weight loss   . Tobacco abuse   . History of subtotal gastrectomy with Roux-en-Y recontruction   . Orthostatic hypotension 08/11/2014  . Severe protein-calorie malnutrition ([place]) 12/30/2013  . Constipation, chronic 07/10/2013  . Chronic abdominal pain   . COPD (chronic obstructive pulmonary disease) ([place]) 11/07/2010  . Major depressive disorder, recurrent episode, moderate ([place]) 06/06/2010  . 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 Anxiety state 01/07/2007  . Barrett's esophagus 07/03/2005    Past Surgical History:  Procedure  Laterality Date  . BIOPSY THYROID  08-13-11   benign nodule, per Dr. Melida Quitter   . CATARACT EXTRACTION W/ INTRAOCULAR LENS  IMPLANT, BILATERAL Bilateral   . COLONOSCOPY    . DILATION AND CURETTAGE OF UTERUS    . ESOPHAGOGASTRODUODENOSCOPY  12-29-09   dr qadeer at [facility], several gastric ulcers  . ESOPHAGOGASTRODUODENOSCOPY N/A 09/25/2012   Procedure: ESOPHAGOGASTRODUODENOSCOPY (EGD);  Surgeon: Beryle Beams, MD;  Location: [facility];  Service: Endoscopy;  Laterality: N/A;  . FRACTURE SURGERY    . GASTROJEJUNOSTOMY  02-20-08   Roux en Y gastrojejunsotomy w 1 foot Roux limb  . GASTROSTOMY N/A 07/26/2014   Procedure: LAPRASCOPIC ASSISTED OPEN PLACEMENT OF JEJUNOSTOMY TUBE;  Surgeon: Pedro Earls, MD;  Location: [facility];  Service: General;  Laterality: N/A;  . HERNIA REPAIR     "hiatal"  . JOINT REPLACEMENT    . LAPAROSCOPIC NISSEN FUNDOPLICATION    . LAPAROSCOPIC PARTIAL GASTRECTOMY N/A 06/29/2013   Procedure: Gastrectomy and GJ Tube placement;  Surgeon: Pedro Earls, MD;  Location: [facility];  Service: General;  Laterality: N/A;  . ORIF HIP FRACTURE Bilateral    "pins in both of them"  . REPAIR OF PERFORATED ULCER    . TONSILLECTOMY      OB History    No data available       Home Medications    Prior to Admission medications   Medication Sig Start Date End Date Taking? Authorizing Provider  albuterol (PROVENTIL HFA;VENTOLIN HFA) 108 (90 Base) MCG/ACT inhaler Inhale 2 puffs into the lungs every 4 (four) hours as needed (cough, shortness of breath or wheezing.). 08/01/15  Yes Shawnee Knapp, MD  ALPRAZolam [name]) 1 MG tablet Take 1-2 tabs po qhs prn sleep. This is a 1 week supply. May fill Saturday 06/15/16 05/28/16  Yes Shawnee Knapp, MD  DULoxetine (CYMBALTA) 60 MG capsule TAKE (1) CAPSULE DAILY. 06/11/16  Yes Shawnee Knapp, MD  furosemide (LASIX) 20 MG tablet TAKE 1 TABLET ONCE DAILY AS NEEDED FOR EDEMA. 05/29/16  Yes Shawnee Knapp, MD  HYDROcodone-acetaminophen (NORCO) 10-325 MG tablet  Take 1-2 tablets by mouth every 6 (six) hours as needed for moderate pain. This is a 1 week supply. Ok to fill Saturday 06/15/2016 05/28/16  Yes Shawnee Knapp, MD  hydrOXYzine (ATARAX/VISTARIL) 50 MG tablet Take 1 tablet (50 mg total) by mouth 3 (three) times daily as needed for anxiety. 06/11/16  Yes Shawnee Knapp, MD  ipratropium (ATROVENT) 0.03 % nasal spray Place 2 sprays into both nostrils 2 (two) times daily. 10/20/15  Yes Posey Boyer, MD  ipratropium-albuterol (DUONEB) 0.5-2.5 (3) MG/3ML SOLN Take 3 mLs by nebulization 4 (four) times daily -  before meals and at bedtime. 06/03/15  Yes Robyn Haber, MD  methocarbamol (ROBAXIN) 500 MG tablet Take 1-2 tablets (500-1,000  mg total) by mouth 4 (four) times daily as needed for muscle spasms. 06/18/16  Yes Shawnee Knapp, MD  OLANZapine (ZYPREXA) 20 MG tablet Take 1 tablet (20 mg total) by mouth at bedtime. 04/12/16  Yes Shawnee Knapp, MD  omeprazole (PRILOSEC) 40 MG capsule TAKE ONE CAPSULE BY MOUTH TWICE DAILY 10/10/15  Yes Shawnee Knapp, MD  ondansetron (ZOFRAN ODT) 4 MG disintegrating tablet Take 1-2 tablets (4-8 mg total) by mouth every 8 (eight) hours as needed for nausea or vomiting. 04/04/16  Yes Shawnee Knapp, MD  trolamine salicylate (ASPERCREME) 10 % cream Apply 1 application topically as needed for muscle pain. 05/10/16  Yes Shawnee Knapp, MD  ALPRAZolam [name]) 0.5 MG tablet Take 1-2 tablets (0.5-1 mg total) by mouth 3 (three) times daily as needed for anxiety. Ok to fill today 03/28/16 Patient not taking: Reported on 06/20/2016 03/28/16   Shawnee Knapp, MD  ALPRAZolam [name]) 1 MG tablet Take 1-2 tabs po qhs prn sleep. This is a 1 week supply. May fill Saturday 06/08/2016 Patient not taking: Reported on 06/20/2016 05/28/16   Shawnee Knapp, MD  ALPRAZolam [name]) 1 MG tablet Take 1-2 tabs po qhs prn sleep. This is a 1 week supply. May fill Saturday 06/22/2016 05/28/16   Shawnee Knapp, MD  ALPRAZolam [name]) 1 MG tablet Take 1-2 tabs po qhs prn sleep. This is a 1 week  supply. May fill Saturday 1/06 05/28/16   Shawnee Knapp, MD  benzonatate (TESSALON) 100 MG capsule Take 2 capsules (200 mg total) by mouth 3 (three) times daily as needed for cough. 06/20/16   Daleen Bo, MD  feeding supplement (BOOST / RESOURCE BREEZE) LIQD Take 1 Container by mouth 3 (three) times daily between meals. 12/16/15   Shawnee Knapp, MD  HYDROcodone-acetaminophen (NORCO) 10-325 MG tablet Take 1-2 tablets by mouth every 6 (six) hours as needed. This is a 1 week supply. Ok to fill Saturday 06/08/16 Patient not taking: Reported on 06/20/2016 05/28/16   Shawnee Knapp, MD  HYDROcodone-acetaminophen [facility]) 10-325 MG tablet Take 1-2 tablets by mouth every 6 (six) hours as needed. This is a 1 week supply. Ok to fill Saturday 06/22/2016 05/28/16   Shawnee Knapp, MD  HYDROcodone-acetaminophen [facility]) 10-325 MG tablet Take 1-2 tablets by mouth every 6 (six) hours as needed. This is a 1 week supply. Ok to fill Saturday 06/29/16 05/28/16   Shawnee Knapp, MD  levofloxacin (LEVAQUIN) 500 MG tablet Take 1 tablet (500 mg total) by mouth daily. 06/20/16   Daleen Bo, MD  predniSONE (DELTASONE) 10 MG tablet Take 1 tablet (10 mg total) by mouth once. Take q day 6,5,4,3,2,1 06/20/16 06/20/16  Daleen Bo, MD  UNABLE TO FIND Place 1 application into the nose once. Med Name: best Fit evaluation for oxygen, possibly simply go mini 08/18/15   Mancel Bale, PA-C  UNABLE TO FIND Med Name: Best fit evaluation for oxygen - possible simply go mini 08/18/15   Mancel Bale, PA-C  zaleplon (SONATA) 10 MG capsule Take 10 mg by mouth at bedtime as needed for sleep. 03/19/16   Historical Provider, MD    Family History Family History  Problem Relation Age of Onset  . Cancer Mother     kidney, magliant tumor on face  . Stroke Mother   . Celiac disease Father   . Cancer Father     kidney and pancreatic    Social History Social History  Substance Use  Topics  . Smoking status: Current Every Day Smoker    Packs/day: 2.00     Years: 50.00    Types: Cigarettes  . Smokeless tobacco: Never Used  . Alcohol use No     Allergies   Lithium; Celebrex [celecoxib]; Morphine; Quetiapine; and Varenicline tartrate   Review of Systems Review of Systems  All other systems reviewed and are negative.    Physical Exam Updated Vital Signs BP 130/71 (BP Location: Left Arm)   Pulse 89   Temp 99.2 F (37.3 C) (Oral)   Resp 16   SpO2 99%   Physical Exam  Constitutional: She is oriented to person, place, and time. She appears well-developed. No distress.  Frail, appears older than stated age.  HENT:  Head: Normocephalic and atraumatic.  Eyes: Conjunctivae and EOM are normal. Pupils are equal, round, and reactive to light.  Neck: Normal range of motion and phonation normal. Neck supple.  Cardiovascular: Normal rate and regular rhythm.   Pulmonary/Chest: Effort normal. No respiratory distress. She exhibits no tenderness.  Decreased air movement bilaterally with scattered rhonchi and wheezes  Abdominal: Soft. She exhibits no distension. There is no tenderness. There is no guarding.  Musculoskeletal: Normal range of motion. She exhibits no edema or deformity.  Neurological: She is alert and oriented to person, place, and time. She exhibits normal muscle tone.  Skin: Skin is warm and dry.  Psychiatric: She has a normal mood and affect. Her behavior is normal. Judgment and thought content normal.  Nursing note and vitals reviewed.    ED Treatments / Results  Labs (all labs ordered are listed, but only abnormal results are displayed) Labs Reviewed  BASIC METABOLIC PANEL - Abnormal; Notable for the following:       Result Value   Potassium 3.3 (*)    Glucose, Bld 108 (*)    Calcium 8.6 (*)    All other components within normal limits  CBC WITH DIFFERENTIAL/PLATELET - Abnormal; Notable for the following:    RBC 3.67 (*)    Hemoglobin 11.0 (*)    HCT 34.0 (*)    All other components within normal limits     EKG  EKG Interpretation None       Radiology Dg Chest 2 View  Result Date: 06/20/2016 CLINICAL DATA:  Worsening cough, congestion, shortness of breath. EXAM: CHEST  2 VIEW COMPARISON:  08/07/2015 FINDINGS: There is hyperinflation of the lungs compatible with COPD. Heart and mediastinal contours are within normal limits. No focal opacities or effusions. No acute bony abnormality. IMPRESSION: No active cardiopulmonary disease. COPD. Electronically Signed   By: Rolm Baptise M.D.   On: 06/20/2016 11:39    Procedures Procedures (including critical care time)  Medications Ordered in ED Medications  0.9 %  sodium chloride infusion ( Intravenous New Bag/Given 06/20/16 1102)  benzonatate (TESSALON) capsule 100 mg (100 mg Oral Given 06/20/16 1352)     Initial Impression / Assessment and Plan / ED Course  I have reviewed the triage vital signs and the nursing notes.  Pertinent labs & imaging results that were available during my care of the patient were reviewed by me and considered in my medical decision making (see chart for details).  Clinical Course as of Jun 20 1406  Thu Jun 20, 2016  1359 She was able ambulate in the hallway, without desaturation, and feels comfortable at this time.  [EW]    Clinical Course User Index [EW] Daleen Bo, MD    Medications  0.9 %  sodium chloride infusion ( Intravenous New Bag/Given 06/20/16 1102)  benzonatate (TESSALON) capsule 100 mg (100 mg Oral Given 06/20/16 1352)    Patient Vitals for the past 24 hrs:  BP Temp Temp src Pulse Resp SpO2  06/20/16 1329 130/71 - - 89 16 99 %  06/20/16 1230 - - - 83 22 98 %  06/20/16 1204 161/73 - - 107 25 (!) 81 %  06/20/16 1203 161/73 - - 107 - 96 %  06/20/16 1035 - 99.2 F (37.3 C) Oral - - -  06/20/16 1028 126/68 - - 86 20 100 %    2:03 PM Reevaluation with update and discussion. After initial assessment and treatment, an updated evaluation reveals Patient states she feels back to normal, and  denies pain at this time. She feels better after receiving a cough medication. I discussed smoking cessation with the patient I discussed findings with patient and her roommate, all questions answered. Hilary Milks L    Final Clinical Impressions(s) / ED Diagnoses   Final diagnoses:  COPD exacerbation ([place])  Tobacco abuse   COPD exacerbation, with ongoing tobacco abuse. Doubt pneumonia, CHF, metabolic instability or impending vascular collapse.  Nursing Notes Reviewed/ Care Coordinated Applicable Imaging Reviewed Interpretation of Laboratory Data incorporated into ED treatment  The patient appears reasonably screened and/or stabilized for discharge and I doubt any other medical condition or other [facility] requiring further screening, evaluation, or treatment in the ED at this time prior to discharge.  Plan: Home Medications- continue; Home Treatments- stop smoking; return here if the recommended treatment, does not improve the symptoms; Recommended follow up- PCP 1 week   New Prescriptions New Prescriptions   BENZONATATE (TESSALON) 100 MG CAPSULE    Take 2 capsules (200 mg total) by mouth 3 (three) times daily as needed for cough.   LEVOFLOXACIN (LEVAQUIN) 500 MG TABLET    Take 1 tablet (500 mg total) by mouth daily.   PREDNISONE (DELTASONE) 10 MG TABLET    Take 1 tablet (10 mg total) by mouth once. Take q day 6,5,4,3,2,1     Daleen Bo, MD 06/20/16 1409

## 2016-06-20 NOTE — Discharge Instructions (Signed)
Use your nebulizer every 3-4 hours for trouble breathing.  Stop smoking immediately.  See your Dr. for follow-up care, in 1 week, and as needed.

## 2016-06-20 NOTE — ED Notes (Signed)
Gave pt sandwich  

## 2016-06-20 NOTE — ED Triage Notes (Signed)
Pt from home. Hx of COPD. Pt reports productive cough and SOB for the past 2 days. Pt on 2L [place] at home. O2 sat 96% on home O2 upon EMS arrival, however pt was visibly SOB. EMS increased O2 to 4L [place], and gave 10mg  albuterol, 0.5mg  atrovent, and 125mg  solu-medrol. Bilateral wheezing and congestion noted with EMS.

## 2016-06-20 NOTE — ED Notes (Signed)
Gave pt coke per request

## 2016-06-20 NOTE — ED Notes (Signed)
Pt given a Coke.

## 2016-06-22 ENCOUNTER — Encounter (HOSPITAL_COMMUNITY): Payer: Self-pay | Admitting: *Deleted

## 2016-06-22 ENCOUNTER — Emergency Department (HOSPITAL_COMMUNITY): Payer: Medicare Other

## 2016-06-22 ENCOUNTER — Inpatient Hospital Stay (HOSPITAL_COMMUNITY)
Admission: EM | Admit: 2016-06-22 | Discharge: 2016-06-24 | DRG: 189 | Disposition: A | Discharge status: Home Health | Payer: Medicare Other | Attending: Internal Medicine | Admitting: Internal Medicine

## 2016-06-22 DIAGNOSIS — G8929 Other chronic pain: Secondary | ICD-10-CM | POA: Diagnosis present

## 2016-06-22 DIAGNOSIS — Z720 Tobacco use: Secondary | ICD-10-CM | POA: Diagnosis present

## 2016-06-22 DIAGNOSIS — Z8051 Family history of malignant neoplasm of kidney: Secondary | ICD-10-CM

## 2016-06-22 DIAGNOSIS — K219 Gastro-esophageal reflux disease without esophagitis: Secondary | ICD-10-CM | POA: Diagnosis present

## 2016-06-22 DIAGNOSIS — Z808 Family history of malignant neoplasm of other organs or systems: Secondary | ICD-10-CM | POA: Diagnosis not present

## 2016-06-22 DIAGNOSIS — J9601 Acute respiratory failure with hypoxia: Secondary | ICD-10-CM | POA: Diagnosis not present

## 2016-06-22 DIAGNOSIS — R109 Unspecified abdominal pain: Secondary | ICD-10-CM | POA: Diagnosis present

## 2016-06-22 DIAGNOSIS — Z79899 Other long term (current) drug therapy: Secondary | ICD-10-CM | POA: Diagnosis not present

## 2016-06-22 DIAGNOSIS — R0902 Hypoxemia: Secondary | ICD-10-CM | POA: Diagnosis present

## 2016-06-22 DIAGNOSIS — J9621 Acute and chronic respiratory failure with hypoxia: Secondary | ICD-10-CM | POA: Diagnosis not present

## 2016-06-22 DIAGNOSIS — Z823 Family history of stroke: Secondary | ICD-10-CM | POA: Diagnosis not present

## 2016-06-22 DIAGNOSIS — D696 Thrombocytopenia, unspecified: Secondary | ICD-10-CM | POA: Diagnosis present

## 2016-06-22 DIAGNOSIS — Z8711 Personal history of peptic ulcer disease: Secondary | ICD-10-CM

## 2016-06-22 DIAGNOSIS — R05 Cough: Secondary | ICD-10-CM | POA: Diagnosis not present

## 2016-06-22 DIAGNOSIS — F17210 Nicotine dependence, cigarettes, uncomplicated: Secondary | ICD-10-CM | POA: Diagnosis present

## 2016-06-22 DIAGNOSIS — R110 Nausea: Secondary | ICD-10-CM | POA: Diagnosis present

## 2016-06-22 DIAGNOSIS — F339 Major depressive disorder, recurrent, unspecified: Secondary | ICD-10-CM | POA: Diagnosis present

## 2016-06-22 DIAGNOSIS — Z8379 Family history of other diseases of the digestive system: Secondary | ICD-10-CM | POA: Diagnosis not present

## 2016-06-22 DIAGNOSIS — J441 Chronic obstructive pulmonary disease with (acute) exacerbation: Secondary | ICD-10-CM | POA: Diagnosis not present

## 2016-06-22 DIAGNOSIS — R0602 Shortness of breath: Secondary | ICD-10-CM | POA: Diagnosis not present

## 2016-06-22 DIAGNOSIS — Z9981 Dependence on supplemental oxygen: Secondary | ICD-10-CM

## 2016-06-22 DIAGNOSIS — E876 Hypokalemia: Secondary | ICD-10-CM | POA: Diagnosis present

## 2016-06-22 DIAGNOSIS — F411 Generalized anxiety disorder: Secondary | ICD-10-CM | POA: Diagnosis present

## 2016-06-22 DIAGNOSIS — R069 Unspecified abnormalities of breathing: Secondary | ICD-10-CM | POA: Diagnosis not present

## 2016-06-22 LAB — BASIC METABOLIC PANEL
Anion gap: 9 (ref 5–15)
BUN: 14 mg/dL (ref 6–20)
CHLORIDE: 100 mmol/L — AB (ref 101–111)
CO2: 32 mmol/L (ref 22–32)
Calcium: 8.8 mg/dL — ABNORMAL LOW (ref 8.9–10.3)
Creatinine, Ser: 0.64 mg/dL (ref 0.44–1.00)
GFR calc Af Amer: >60 mL/min (ref >60)
GFR calc non Af Amer: >60 mL/min (ref >60)
GLUCOSE: 112 mg/dL — AB (ref 65–99)
POTASSIUM: 3.4 mmol/L — AB (ref 3.5–5.1)
Sodium: 141 mmol/L (ref 135–145)

## 2016-06-22 LAB — CBC WITH DIFFERENTIAL/PLATELET
Basophils Absolute: 0 10*3/uL (ref 0.0–0.1)
Basophils Relative: 0 %
Eosinophils Absolute: 0 10*3/uL (ref 0.0–0.7)
Eosinophils Relative: 0 %
HCT: 41.4 % (ref 36.0–46.0)
Hemoglobin: 13.4 g/dL (ref 12.0–15.0)
LYMPHS ABS: 0.8 10*3/uL (ref 0.7–4.0)
LYMPHS PCT: 9 %
MCH: 30 pg (ref 26.0–34.0)
MCHC: 32.4 g/dL (ref 30.0–36.0)
MCV: 92.8 fL (ref 78.0–100.0)
MONOS PCT: 7 %
Monocytes Absolute: 0.6 10*3/uL (ref 0.1–1.0)
NEUTROS ABS: 7.2 10*3/uL (ref 1.7–7.7)
Neutrophils Relative %: 84 %
Platelets: 129 10*3/uL — ABNORMAL LOW (ref 150–400)
RBC: 4.46 MIL/uL (ref 3.87–5.11)
RDW: 14.3 % (ref 11.5–15.5)
WBC: 8.6 10*3/uL (ref 4.0–10.5)

## 2016-06-22 LAB — MAGNESIUM: Magnesium: 3.3 mg/dL — ABNORMAL HIGH (ref 1.7–2.4)

## 2016-06-22 LAB — TROPONIN I: Troponin I: <0.03 ng/mL (ref <0.03)

## 2016-06-22 MED ORDER — NICOTINE 21 MG/24HR TD PT24
21.0000 mg | MEDICATED_PATCH | Freq: Every day | TRANSDERMAL | Status: DC
  Administered 2016-06-22 – 2016-06-24 (×3): 21 mg via TRANSDERMAL
  Filled 2016-06-22 (×3): qty 1

## 2016-06-22 MED ORDER — DULOXETINE HCL 60 MG PO CPEP
60.0000 mg | ORAL_CAPSULE | Freq: Every day | ORAL | Status: DC
  Administered 2016-06-22 – 2016-06-24 (×3): 60 mg via ORAL
  Filled 2016-06-22 (×3): qty 1

## 2016-06-22 MED ORDER — SODIUM CHLORIDE 0.9% FLUSH
3.0000 mL | Freq: Two times a day (BID) | INTRAVENOUS | Status: DC
  Administered 2016-06-22: 3 mL via INTRAVENOUS

## 2016-06-22 MED ORDER — ALPRAZOLAM 1 MG PO TABS
1.0000 mg | ORAL_TABLET | Freq: Every evening | ORAL | Status: DC | PRN
  Administered 2016-06-22 – 2016-06-23 (×2): 1 mg via ORAL
  Filled 2016-06-22 (×2): qty 1

## 2016-06-22 MED ORDER — OLANZAPINE 10 MG PO TABS
20.0000 mg | ORAL_TABLET | Freq: Every day | ORAL | Status: DC
  Administered 2016-06-22 – 2016-06-23 (×2): 20 mg via ORAL
  Filled 2016-06-22 (×2): qty 2

## 2016-06-22 MED ORDER — HYDROCODONE-ACETAMINOPHEN 10-325 MG PO TABS
1.0000 | ORAL_TABLET | Freq: Four times a day (QID) | ORAL | Status: DC | PRN
  Administered 2016-06-23 – 2016-06-24 (×4): 2 via ORAL
  Filled 2016-06-22 (×4): qty 2

## 2016-06-22 MED ORDER — PANTOPRAZOLE SODIUM 40 MG PO TBEC
40.0000 mg | DELAYED_RELEASE_TABLET | Freq: Every day | ORAL | Status: DC
  Administered 2016-06-22 – 2016-06-24 (×3): 40 mg via ORAL
  Filled 2016-06-22 (×3): qty 1

## 2016-06-22 MED ORDER — ENOXAPARIN SODIUM 40 MG/0.4ML ~~LOC~~ SOLN
40.0000 mg | SUBCUTANEOUS | Status: DC
Start: 2016-06-22 — End: 2016-06-24
  Administered 2016-06-22: 40 mg via SUBCUTANEOUS
  Filled 2016-06-22 (×2): qty 0.4

## 2016-06-22 MED ORDER — METHYLPREDNISOLONE SODIUM SUCC 125 MG IJ SOLR
125.0000 mg | Freq: Once | INTRAMUSCULAR | Status: AC
  Administered 2016-06-22: 125 mg via INTRAVENOUS
  Filled 2016-06-22: qty 2

## 2016-06-22 MED ORDER — METHYLPREDNISOLONE SODIUM SUCC 125 MG IJ SOLR
60.0000 mg | Freq: Four times a day (QID) | INTRAMUSCULAR | Status: AC
  Administered 2016-06-22 – 2016-06-23 (×4): 60 mg via INTRAVENOUS
  Filled 2016-06-22 (×4): qty 2

## 2016-06-22 MED ORDER — FUROSEMIDE 20 MG PO TABS
20.0000 mg | ORAL_TABLET | Freq: Every day | ORAL | Status: DC
  Administered 2016-06-22 – 2016-06-24 (×3): 20 mg via ORAL
  Filled 2016-06-22 (×3): qty 1

## 2016-06-22 MED ORDER — LEVOFLOXACIN 500 MG PO TABS
500.0000 mg | ORAL_TABLET | Freq: Every day | ORAL | Status: DC
  Administered 2016-06-22 – 2016-06-24 (×3): 500 mg via ORAL
  Filled 2016-06-22 (×3): qty 1

## 2016-06-22 MED ORDER — IPRATROPIUM BROMIDE 0.02 % IN SOLN: 0.5000 mg | Freq: Four times a day (QID) | RESPIRATORY_TRACT | Status: DC

## 2016-06-22 MED ORDER — IPRATROPIUM-ALBUTEROL 0.5-2.5 (3) MG/3ML IN SOLN: 3.0000 mL | RESPIRATORY_TRACT | Status: DC | PRN

## 2016-06-22 MED ORDER — ONDANSETRON 4 MG PO TBDP
4.0000 mg | ORAL_TABLET | Freq: Three times a day (TID) | ORAL | Status: DC | PRN
  Administered 2016-06-22 – 2016-06-23 (×2): 8 mg via ORAL
  Filled 2016-06-22: qty 1
  Filled 2016-06-22: qty 2

## 2016-06-22 MED ORDER — SODIUM CHLORIDE 0.9% FLUSH
3.0000 mL | INTRAVENOUS | Status: DC | PRN
  Administered 2016-06-22: 3 mL via INTRAVENOUS
  Filled 2016-06-22: qty 3

## 2016-06-22 MED ORDER — PREDNISONE 20 MG PO TABS
40.0000 mg | ORAL_TABLET | Freq: Every day | ORAL | Status: DC
  Administered 2016-06-23: 40 mg via ORAL
  Filled 2016-06-22: qty 2

## 2016-06-22 MED ORDER — ONDANSETRON HCL 4 MG/2ML IJ SOLN: 4.0000 mg | Freq: Four times a day (QID) | INTRAMUSCULAR | Status: DC | PRN

## 2016-06-22 MED ORDER — SODIUM CHLORIDE 0.9% FLUSH
3.0000 mL | Freq: Two times a day (BID) | INTRAVENOUS | Status: DC
  Administered 2016-06-22 – 2016-06-23 (×2): 3 mL via INTRAVENOUS

## 2016-06-22 MED ORDER — ACETAMINOPHEN 325 MG PO TABS
650.0000 mg | ORAL_TABLET | Freq: Four times a day (QID) | ORAL | Status: DC | PRN
  Administered 2016-06-23: 650 mg via ORAL
  Filled 2016-06-22: qty 2

## 2016-06-22 MED ORDER — HYDROXYZINE HCL 50 MG PO TABS
50.0000 mg | ORAL_TABLET | Freq: Three times a day (TID) | ORAL | Status: DC | PRN
Start: 2016-06-22 — End: 2016-06-24

## 2016-06-22 MED ORDER — MORPHINE SULFATE (PF) 2 MG/ML IV SOLN
1.0000 mg | INTRAVENOUS | Status: DC | PRN
  Administered 2016-06-22 – 2016-06-23 (×6): 1 mg via INTRAVENOUS
  Filled 2016-06-22 (×5): qty 1

## 2016-06-22 MED ORDER — ARFORMOTEROL TARTRATE 15 MCG/2ML IN NEBU
15.0000 ug | INHALATION_SOLUTION | Freq: Two times a day (BID) | RESPIRATORY_TRACT | Status: DC
  Administered 2016-06-22 – 2016-06-24 (×4): 15 ug via RESPIRATORY_TRACT
  Filled 2016-06-22 (×4): qty 2

## 2016-06-22 MED ORDER — ACETAMINOPHEN 650 MG RE SUPP: 650.0000 mg | Freq: Four times a day (QID) | RECTAL | Status: DC | PRN

## 2016-06-22 MED ORDER — DIPHENHYDRAMINE HCL 25 MG PO CAPS: 25.0000 mg | ORAL_CAPSULE | Freq: Four times a day (QID) | ORAL | Status: DC | PRN

## 2016-06-22 MED ORDER — METHOCARBAMOL 500 MG PO TABS: 500.0000 mg | ORAL_TABLET | Freq: Four times a day (QID) | ORAL | Status: DC | PRN

## 2016-06-22 MED ORDER — MOMETASONE FURO-FORMOTEROL FUM 200-5 MCG/ACT IN AERO
2.0000 | INHALATION_SPRAY | Freq: Two times a day (BID) | RESPIRATORY_TRACT | Status: DC
  Administered 2016-06-23 – 2016-06-24 (×2): 2 via RESPIRATORY_TRACT
  Filled 2016-06-22: qty 8.8

## 2016-06-22 MED ORDER — ALBUTEROL SULFATE (2.5 MG/3ML) 0.083% IN NEBU: 3.0000 mL | INHALATION_SOLUTION | Freq: Four times a day (QID) | RESPIRATORY_TRACT | Status: DC

## 2016-06-22 MED ORDER — POTASSIUM CHLORIDE CRYS ER 20 MEQ PO TBCR
40.0000 meq | EXTENDED_RELEASE_TABLET | Freq: Once | ORAL | Status: AC
  Administered 2016-06-22: 40 meq via ORAL
  Filled 2016-06-22: qty 2

## 2016-06-22 MED ORDER — LORAZEPAM 1 MG PO TABS
1.0000 mg | ORAL_TABLET | Freq: Once | ORAL | Status: AC
  Administered 2016-06-22: 1 mg via ORAL
  Filled 2016-06-22: qty 1

## 2016-06-22 MED ORDER — IPRATROPIUM-ALBUTEROL 0.5-2.5 (3) MG/3ML IN SOLN
3.0000 mL | Freq: Four times a day (QID) | RESPIRATORY_TRACT | Status: DC
  Administered 2016-06-22 – 2016-06-24 (×7): 3 mL via RESPIRATORY_TRACT
  Filled 2016-06-22 (×8): qty 3

## 2016-06-22 MED ORDER — IPRATROPIUM-ALBUTEROL 0.5-2.5 (3) MG/3ML IN SOLN
3.0000 mL | Freq: Once | RESPIRATORY_TRACT | Status: AC
  Administered 2016-06-22: 3 mL via RESPIRATORY_TRACT
  Filled 2016-06-22: qty 3

## 2016-06-22 MED ORDER — KETOROLAC TROMETHAMINE 30 MG/ML IJ SOLN
30.0000 mg | Freq: Once | INTRAMUSCULAR | Status: AC
  Administered 2016-06-22: 30 mg via INTRAVENOUS
  Filled 2016-06-22: qty 1

## 2016-06-22 MED ORDER — SODIUM CHLORIDE 0.9 % IV SOLN: 250.0000 mL | INTRAVENOUS | Status: DC | PRN

## 2016-06-22 MED ORDER — BENZONATATE 100 MG PO CAPS
200.0000 mg | ORAL_CAPSULE | Freq: Three times a day (TID) | ORAL | Status: DC
  Administered 2016-06-22 – 2016-06-24 (×6): 200 mg via ORAL
  Filled 2016-06-22 (×6): qty 2

## 2016-06-22 MED ORDER — ONDANSETRON HCL 4 MG PO TABS: 4.0000 mg | ORAL_TABLET | Freq: Four times a day (QID) | ORAL | Status: DC | PRN

## 2016-06-22 NOTE — ED Provider Notes (Signed)
[facility] DEPT Provider Note   CSN: [id number] Arrival date & time: 06/22/16  [id number]     History   Chief Complaint Chief Complaint  Patient presents with  . Respiratory Distress    HPI MORRISA SCHAEFER is a 65 y.o. female    [place] is a 65 year old with history of COPD, asthma presents to the ED by EMS due to respiratory distress just prior to arrival. Patient reports shortness of breath and cough just prior to arrival. She states she uses oxygen at home at 5L all day. She alerted EMS because she had difficulty breathing. Patient reports recently being in the ED 2 days ago for similar symptoms. She reports starting prednisone yesterday as well as her antibiotic. Patient denies any fevers, chills, nausea, vomiting, dizziness.  EMS presented with patient on CPAP. Patient was given albuterol/atrovent on CPAP prior to arrival. Patient states that since EMS put her on CPAP she has been feeling better. Patient admits to running out of her medicine last night. Patient states that she got a call today while in ED about her mother passing today. Patient also admits to anxiety due to the recent new of her mother.    The history is provided by the patient and the EMS personnel.    Past Medical History:  Diagnosis Date  . ACUTE DUODEN ULCER W/HEMORR PERF&OBSTRUCTION 06/26/2004   Qualifier: Diagnosis of  By: [name] MD, [name]   . Acute duodenal ulcer with hemorrhage and perforation, with obstruction ([place])   . Allergy   . Anemia   . Anginal pain ([place])    "many many years ago"  . Anxiety    sees [name] NP at Dr. Radonna Ricker office  . Arthritis    "hips" (09/06/2014)  . Asthma   . Barrett's esophagus   . Chronic abdominal pain    narcotic dependence, dr gyarteng-dak at [facility]  . 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 Complication of anesthesia    pt states had too much xanax on board - agitated   . COPD (chronic obstructive pulmonary disease)  ([place])   . Depression   . Exertional shortness of breath   . FEVER, RECURRENT 08/11/2009   Qualifier: Diagnosis of  By: [name] MD, [name]   . Fx of fibula 07-28-11   left fibula, 2 places   . Fx two ribs-open 08-24-11   left 4th and 5th  . GASTRIC OUTLET OBSTRUCTION 08/28/2007   In July 2008 she underwent laparoscopic enterolysis, Nissen fundoplication over a [id number] bougie, single pledgeted suture colosure of the hiatus and a 3 suture wrap.  Lap truncal vagotomy and a loop gastrojejunostomy was performed.  This was revised in August of 2009 to a roux en Y gastrojejujostomy.     . Gastroparesis 06/03/2007   Qualifier: Diagnosis of  By: [name] MD, [name]   . GERD (gastroesophageal reflux disease)   . Headache    "monthly" (09/06/2014)  . History of blood transfusion    "related to OR; maybe when I perforated that ulcer"  . History of hiatal hernia   . Lap Nissen + truncal vagotomy July 2008 05/13/2013  . LEG EDEMA 01/19/2008   Qualifier: Diagnosis of  By: [name] MD, [name]   . Menopause   . MENOPAUSE 01/07/2007   Qualifier: Diagnosis of  By: [name], CMA, [place]    . Myocardial infarction   . Narcotic abuse   . Peptic ulcer disease  dr Oletta Lamas  . Pneumonia, organism unspecified(486) 11/07/2010   Assoc with R Parapneumonic effusion 09/2010    - Tapped 10/05/10    - CxR resolved 11/07/2010    . S/P jejunostomy Feb 2016 07/26/2014    Patient Active Problem List   Diagnosis Date Noted  . COPD exacerbation ([place]) 06/22/2016  . Abdominal pain   . Hypoxia 04/08/2015  . Opioid use with withdrawal ([place])   . Protein-calorie malnutrition ([place])   . Episode of recurrent major depressive disorder ([place])   . Secondary hyperparathyroidism ([place]) 01/10/2015  . Vitamin D deficiency 01/10/2015  . 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 Anemia, iron deficiency 11/03/2014  . Chronic pain syndrome 10/24/2014  . Osteoporosis 10/05/2014  . Vertebral compression fracture ([place]) 10/05/2014  . Coronary  artery disease due to calcified coronary lesion 10/05/2014  . Weight loss   . Tobacco abuse   . History of subtotal gastrectomy with Roux-en-Y recontruction   . Orthostatic hypotension 08/11/2014  . Severe protein-calorie malnutrition ([place]) 12/30/2013  . Constipation, chronic 07/10/2013  . Chronic abdominal pain   . COPD (chronic obstructive pulmonary disease) ([place]) 11/07/2010  . Major depressive disorder, recurrent episode, moderate ([place]) 06/06/2010  . 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 Anxiety state 01/07/2007  . Barrett's esophagus 07/03/2005    Past Surgical History:  Procedure Laterality Date  . BIOPSY THYROID  08-13-11   benign nodule, per Dr. Melida Quitter   . CATARACT EXTRACTION W/ INTRAOCULAR LENS  IMPLANT, BILATERAL Bilateral   . COLONOSCOPY    . DILATION AND CURETTAGE OF UTERUS    . ESOPHAGOGASTRODUODENOSCOPY  12-29-09   dr qadeer at [facility], several gastric ulcers  . ESOPHAGOGASTRODUODENOSCOPY N/A 09/25/2012   Procedure: ESOPHAGOGASTRODUODENOSCOPY (EGD);  Surgeon: Beryle Beams, MD;  Location: [facility];  Service: Endoscopy;  Laterality: N/A;  . FRACTURE SURGERY    . GASTROJEJUNOSTOMY  02-20-08   Roux en Y gastrojejunsotomy w 1 foot Roux limb  . GASTROSTOMY N/A 07/26/2014   Procedure: LAPRASCOPIC ASSISTED OPEN PLACEMENT OF JEJUNOSTOMY TUBE;  Surgeon: Pedro Earls, MD;  Location: [facility];  Service: General;  Laterality: N/A;  . HERNIA REPAIR     "hiatal"  . JOINT REPLACEMENT    . LAPAROSCOPIC NISSEN FUNDOPLICATION    . LAPAROSCOPIC PARTIAL GASTRECTOMY N/A 06/29/2013   Procedure: Gastrectomy and GJ Tube placement;  Surgeon: Pedro Earls, MD;  Location: [facility];  Service: General;  Laterality: N/A;  . ORIF HIP FRACTURE Bilateral    "pins in both of them"  . REPAIR OF PERFORATED ULCER    . TONSILLECTOMY      OB History    No data available       Home Medications    Prior to Admission medications   Medication Sig Start Date End Date Taking? Authorizing Provider   albuterol (PROVENTIL HFA;VENTOLIN HFA) 108 (90 Base) MCG/ACT inhaler Inhale 2 puffs into the lungs every 4 (four) hours as needed (cough, shortness of breath or wheezing.). 08/01/15  Yes Shawnee Knapp, MD  ALPRAZolam [name]) 1 MG tablet Take 1-2 tabs po qhs prn sleep. This is a 1 week supply. May fill Saturday 1/06 05/28/16  Yes Shawnee Knapp, MD  benzonatate (TESSALON) 100 MG capsule Take 2 capsules (200 mg total) by mouth 3 (three) times daily as needed for cough. 06/20/16  Yes Daleen Bo, MD  DULoxetine (CYMBALTA) 60 MG capsule TAKE (1) CAPSULE DAILY. 06/11/16  Yes Shawnee Knapp, MD  furosemide (LASIX) 20 MG tablet TAKE  1 TABLET ONCE DAILY AS NEEDED FOR EDEMA. 05/29/16  Yes Shawnee Knapp, MD  HYDROcodone-acetaminophen (NORCO) 10-325 MG tablet Take 1-2 tablets by mouth every 6 (six) hours as needed. This is a 1 week supply. Ok to fill Saturday 06/08/16 05/28/16  Yes Shawnee Knapp, MD  ipratropium-albuterol (DUONEB) 0.5-2.5 (3) MG/3ML SOLN Take 3 mLs by nebulization 4 (four) times daily -  before meals and at bedtime. 06/03/15  Yes Robyn Haber, MD  levofloxacin (LEVAQUIN) 500 MG tablet Take 1 tablet (500 mg total) by mouth daily. 06/20/16  Yes Daleen Bo, MD  methocarbamol (ROBAXIN) 500 MG tablet Take 1-2 tablets (500-1,000 mg total) by mouth 4 (four) times daily as needed for muscle spasms. 06/18/16  Yes Shawnee Knapp, MD  OLANZapine (ZYPREXA) 20 MG tablet Take 1 tablet (20 mg total) by mouth at bedtime. 04/12/16  Yes Shawnee Knapp, MD  omeprazole (PRILOSEC) 40 MG capsule TAKE ONE CAPSULE BY MOUTH TWICE DAILY 10/10/15  Yes Shawnee Knapp, MD  ondansetron (ZOFRAN ODT) 4 MG disintegrating tablet Take 1-2 tablets (4-8 mg total) by mouth every 8 (eight) hours as needed for nausea or vomiting. 04/04/16  Yes Shawnee Knapp, MD  hydrOXYzine (ATARAX/VISTARIL) 50 MG tablet Take 1 tablet (50 mg total) by mouth 3 (three) times daily as needed for anxiety. 06/11/16   Shawnee Knapp, MD  UNABLE TO FIND Place 1 application into the nose  once. Med Name: best Fit evaluation for oxygen, possibly simply go mini 08/18/15   Mancel Bale, PA-C  UNABLE TO FIND Med Name: Best fit evaluation for oxygen - possible simply go mini 08/18/15   Mancel Bale, PA-C    Family History Family History  Problem Relation Age of Onset  . Cancer Mother     kidney, magliant tumor on face  . Stroke Mother   . Celiac disease Father   . Cancer Father     kidney and pancreatic    Social History Social History  Substance Use Topics  . Smoking status: Current Every Day Smoker    Packs/day: 2.00    Years: 50.00    Types: Cigarettes  . Smokeless tobacco: Never Used  . Alcohol use No     Allergies   Lithium; Celebrex [celecoxib]; Morphine; Quetiapine; and Varenicline tartrate   Review of Systems Review of Systems  Constitutional: Negative for chills and fever.  HENT: Negative for congestion and sneezing.   Eyes: Negative for photophobia and visual disturbance.  Respiratory: Positive for shortness of breath and wheezing.   Cardiovascular: Positive for chest pain.  Gastrointestinal: Negative for diarrhea, nausea and vomiting.  Genitourinary: Negative for difficulty urinating and dysuria.  Musculoskeletal: Negative for back pain, neck pain and neck stiffness.  Skin: Positive for color change (Abdomen). Negative for wound.  Neurological: Negative for dizziness and syncope.     Physical Exam Updated Vital Signs BP (!) 144/86 (BP Location: Right Arm)   Pulse 100   Temp 98.4 F (36.9 C) (Oral)   Resp 18   Ht 5\' 4"  (1.626 m)   Wt 49.9 kg   SpO2 99%   BMI 18.88 kg/m   Physical Exam  Constitutional: She is oriented to person, place, and time. She appears well-developed and well-nourished.  Frail  HENT:  Head: Normocephalic and atraumatic.  Nose: Nose normal.  Eyes: Conjunctivae and EOM are normal. Pupils are equal, round, and reactive to light.  Neck: Normal range of motion. Neck supple. No tracheal deviation present.  Cardiovascular: Normal rate, normal heart sounds and intact distal pulses.   Pulmonary/Chest: She is in respiratory distress. She has wheezes. She exhibits no tenderness.  Abdominal: Soft. Bowel sounds are normal. There is no tenderness. There is no rebound and no guarding.  Musculoskeletal: Normal range of motion.  Neurological: She is alert and oriented to person, place, and time.  Skin: Skin is warm. Capillary refill takes 2 to 3 seconds.  Psychiatric: She has a normal mood and affect. Her behavior is normal.  Nursing note and vitals reviewed.    ED Treatments / Results  Labs (all labs ordered are listed, but only abnormal results are displayed) Labs Reviewed  BASIC METABOLIC PANEL - Abnormal; Notable for the following:       Result Value   Potassium 3.4 (*)    Chloride 100 (*)    Glucose, Bld 112 (*)    Calcium 8.8 (*)    All other components within normal limits  CBC WITH DIFFERENTIAL/PLATELET - Abnormal; Notable for the following:    Platelets 129 (*)    All other components within normal limits  MAGNESIUM - Abnormal; Notable for the following:    Magnesium 3.3 (*)    All other components within normal limits  CULTURE, EXPECTORATED SPUTUM-ASSESSMENT  TROPONIN I    EKG  EKG Interpretation  Date/Time:  Saturday June 22 2016 07:54:53 EST Ventricular Rate:  111 PR Interval:    QRS Duration: 78 QT Interval:  318 QTC Calculation: 433 R Axis:   77 Text Interpretation:  Sinus tachycardia Biatrial enlargement Since last tracing of earlier today No significant change was found Confirmed by Eulis Foster  MD, E[place]IOTT [phone]) on 06/22/2016 8:00:30 AM       Radiology Dg Chest Port 1 View  Result Date: 06/22/2016 CLINICAL DATA:  COPD, shortness of breath, cough, congestion for 3 days. EXAM: PORTABLE CHEST 1 VIEW COMPARISON:  06/20/2016. FINDINGS: There is hyperinflation of the lungs compatible with COPD. Heart and mediastinal contours are within normal limits. No focal  opacities or effusions. No acute bony abnormality. IMPRESSION: COPD.  No active disease. Electronically Signed   By: Rolm Baptise M.D.   On: 06/22/2016 08:01    Procedures Procedures (including critical care time)  CRITICAL CARE Performed by: Kemper Durie Alonna Bartling   Total critical care time: 45 minutes  Critical care time was exclusive of separately billable procedures and treating other patients.  Critical care was necessary to treat or prevent imminent or life-threatening deterioration.  Critical care was time spent personally by me on the following activities: development of treatment plan with patient and/or surrogate as well as nursing, discussions with consultants, evaluation of patient's response to treatment, examination of patient, obtaining history from patient or surrogate, ordering and performing treatments and interventions, ordering and review of laboratory studies, ordering and review of radiographic studies, pulse oximetry and re-evaluation of patient's condition.  Medications Ordered in ED Medications  benzonatate (TESSALON) capsule 200 mg (200 mg Oral Given 06/22/16 1608)  levofloxacin (LEVAQUIN) tablet 500 mg (500 mg Oral Given 06/22/16 1608)  methocarbamol (ROBAXIN) tablet 500-1,000 mg (not administered)  DULoxetine (CYMBALTA) DR capsule 60 mg (60 mg Oral Given 06/22/16 1608)  hydrOXYzine (ATARAX/VISTARIL) tablet 50 mg (not administered)  ALPRAZolam (XANAX) tablet 1 mg (not administered)  furosemide (LASIX) tablet 20 mg (20 mg Oral Given 06/22/16 1608)  HYDROcodone-acetaminophen (NORCO) 10-325 MG per tablet 1-2 tablet (not administered)  OLANZapine (ZYPREXA) tablet 20 mg (not administered)  ondansetron (ZOFRAN-ODT) disintegrating tablet 4-8 mg (8 mg  Oral Given 06/22/16 1607)  pantoprazole (PROTONIX) EC tablet 40 mg (not administered)  enoxaparin (LOVENOX) injection 40 mg (not administered)  sodium chloride flush (NS) 0.9 % injection 3 mL (not administered)    sodium chloride flush (NS) 0.9 % injection 3 mL (not administered)  sodium chloride flush (NS) 0.9 % injection 3 mL (3 mLs Intravenous Given 06/22/16 1609)  0.9 %  sodium chloride infusion (not administered)  acetaminophen (TYLENOL) tablet 650 mg (not administered)    Or  acetaminophen (TYLENOL) suppository 650 mg (not administered)  ondansetron (ZOFRAN) tablet 4 mg (not administered)    Or  ondansetron (ZOFRAN) injection 4 mg (not administered)  nicotine (NICODERM CQ - dosed in mg/24 hours) patch 21 mg (21 mg Transdermal Patch Applied 06/22/16 1607)  methylPREDNISolone sodium succinate (SOLU-MEDROL) 125 mg/2 mL injection 60 mg (60 mg Intravenous Given 06/22/16 1607)    Followed by  predniSONE (DELTASONE) tablet 40 mg (not administered)  mometasone-formoterol (DULERA) 200-5 MCG/ACT inhaler 2 puff (not administered)  arformoterol (BROVANA) nebulizer solution 15 mcg (not administered)  ipratropium-albuterol (DUONEB) 0.5-2.5 (3) MG/3ML nebulizer solution 3 mL (not administered)  morphine 2 MG/ML injection 1 mg (1 mg Intravenous Given 06/22/16 1608)  diphenhydrAMINE (BENADRYL) capsule 25 mg (not administered)  ipratropium-albuterol (DUONEB) 0.5-2.5 (3) MG/3ML nebulizer solution 3 mL (3 mLs Nebulization Given 06/22/16 1620)  methylPREDNISolone sodium succinate (SOLU-MEDROL) 125 mg/2 mL injection 125 mg (125 mg Intravenous Given 06/22/16 0750)  LORazepam (ATIVAN) tablet 1 mg (1 mg Oral Given 06/22/16 0910)  ipratropium-albuterol (DUONEB) 0.5-2.5 (3) MG/3ML nebulizer solution 3 mL (3 mLs Nebulization Given 06/22/16 1117)  ketorolac (TORADOL) 30 MG/ML injection 30 mg (30 mg Intravenous Given 06/22/16 1259)  potassium chloride SA (K-DUR,KLOR-CON) CR tablet 40 mEq (40 mEq Oral Given 06/22/16 1608)     Initial Impression / Assessment and Plan / ED Course  I have reviewed the triage vital signs and the nursing notes.  Pertinent labs & imaging results that were available during my care of the  patient were reviewed by me and considered in my medical decision making (see chart for details).  Clinical Course as of Jun 22 1636  Sat Jun 22, 2016  [id number] Glucose: Marland Kitchen 112 [EW]    Clinical Course User Index [EW] Daleen Bo, MD   Patient is a 65 year old female presenting with respiratory distress just prior to arrival brought in by EMS. Patient uses oxygen at home on 5 L. Patient just here 2 days ago for similar complaint. Patient was given a breathing treatment and antibiotic and then. She is now back and presented by EMS and was put on oxygen, CPAP and a nebulizer treatment for respiratory distress. She is now on oxygen 5 L and using accessory muscles. Patient tachycardic, tachypneic, afebrile. Heart sounds are clear. Abdomen soft and non tender. Lab work similar previous findings. Troponin negative. Chest x-ray negative for any acute findings or signs of pneumonia, or effusion.  09:30   Patient Feeling better but still feels like she Can benefit from another nebulizer treatment. Patient still using some accessory muscle usage. Heart sound clear. Lungs still have mild expiratory wheezing, however sounds better than previous. We'll give another nebulizer treatment.   12:15 Patient states that she is feeling better, but still feels like she is wheezing. Patient still using some accessory muscle usage. Heart sounds. Wheezing still heard on auscultation of lungs. Discussed possible discharge. Patient not comfortable with being discharged just yet, she feels as if she still not at her normal baseline.  I spoke with Dr. Ree Kida who agreed to admit patient to telemetry.  Patient also seen and evaluated by Dr. Eulis Foster.  Final Clinical Impressions(s) / ED Diagnoses   Final diagnoses:  COPD exacerbation [facility])    New Prescriptions Current Discharge Medication List       [place], [place] 06/22/16 1643    Daleen Bo, MD 06/24/16 1820

## 2016-06-22 NOTE — ED Notes (Signed)
Bed: RESB Expected date:  Expected time:  Means of arrival:  Comments: Resp distress 

## 2016-06-22 NOTE — ED Triage Notes (Signed)
Chaplin in to visit with pt.

## 2016-06-22 NOTE — ED Triage Notes (Signed)
Pt requesting something for pain and anxiety, Dr Eulis Foster aware.

## 2016-06-22 NOTE — ED Notes (Signed)
Bed: WA14 Expected date:  Expected time:  Means of arrival:  Comments: 

## 2016-06-22 NOTE — ED Triage Notes (Signed)
EMS presents with pt on CPAP due to resp distress, pt torso mottled  #22 IV rt hand, Mag 2 grams, %mg Albuterol, .5 atrovent via cpap, pt able to speak in short sentences on arrival.

## 2016-06-22 NOTE — H&P (Signed)
[facility] History and Physical  [name] [id number] DOB: 07/20/1950 DOA: 06/22/2016  PCP: [name], MD  Patient coming from: home  Chief Complaint: Shortness of breath  HPI: [name] is a 65 y.o. female with a medical history of COPD, arthritis, GERD, presented to the emergency department with complaints of shortness of breath. Patient states she came to emergency department 2 days ago for shortness of breath. She has been with her mother who has been actively dying the last couple of days. Patient states she does have a nebulizer machine at home however has not been using it. She does continue to smoke 1 pack per day. She does not use her oxygen while she is smoking. Patient did receive her influenza as well as pneumonia vaccinations and shear. Patient does endorse a dry hacking cough. She does complain of abdominal pain which is chronic. She denies any sick or ill contacts. Denies any recent travel. Currently denies any chest pain, dizziness, headache, vomiting, diarrhea, constipation. She does endorse some nausea. Patient states she ran out of her medications yesterday evening. She has been very anxious the last few days.  ED Course: Patient found to have COPD exacerbation. Given 2 nebulizer treatments and slight mental and emergency department. Patient continues to have shortness of breath. [facility] called for admission.  Review of Systems:  All other systems reviewed and are negative.   Past Medical History:  Diagnosis Date  . ACUTE DUODEN ULCER W/HEMORR PERF&OBSTRUCTION 06/26/2004   Qualifier: Diagnosis of  By: [name] MD, [name]   . Acute duodenal ulcer with hemorrhage and perforation, with obstruction ([place])   . Allergy   . Anemia   . Anginal pain ([place])    "many many years ago"  . Anxiety    sees [name] NP at Dr. Radonna Ricker office  . Arthritis    "hips" (09/06/2014)  . Asthma   . Barrett's esophagus   . Chronic abdominal pain    narcotic dependence, dr  gyarteng-dak at [facility]  . 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 Complication of anesthesia    pt states had too much xanax on board - agitated   . COPD (chronic obstructive pulmonary disease) ([place])   . Depression   . Exertional shortness of breath   . FEVER, RECURRENT 08/11/2009   Qualifier: Diagnosis of  By: [name] MD, [name]   . Fx of fibula 07-28-11   left fibula, 2 places   . Fx two ribs-open 08-24-11   left 4th and 5th  . GASTRIC OUTLET OBSTRUCTION 08/28/2007   In July 2008 she underwent laparoscopic enterolysis, Nissen fundoplication over a [id number] bougie, single pledgeted suture colosure of the hiatus and a 3 suture wrap.  Lap truncal vagotomy and a loop gastrojejunostomy was performed.  This was revised in August of 2009 to a roux en Y gastrojejujostomy.     . Gastroparesis 06/03/2007   Qualifier: Diagnosis of  By: [name] MD, [name]   . GERD (gastroesophageal reflux disease)   . Headache    "monthly" (09/06/2014)  . History of blood transfusion    "related to OR; maybe when I perforated that ulcer"  . History of hiatal hernia   . Lap Nissen + truncal vagotomy July 2008 05/13/2013  . LEG EDEMA 01/19/2008   Qualifier: Diagnosis of  By: [name] MD, [name]   . Menopause   . MENOPAUSE 01/07/2007   Qualifier: Diagnosis of  By: [name], CMA, [place]    .  Myocardial infarction   . Narcotic abuse   . Peptic ulcer disease    dr Oletta Lamas  . Pneumonia, organism unspecified(486) 11/07/2010   Assoc with R Parapneumonic effusion 09/2010    - Tapped 10/05/10    - CxR resolved 11/07/2010    . S/P jejunostomy Feb 2016 07/26/2014    Past Surgical History:  Procedure Laterality Date  . BIOPSY THYROID  08-13-11   benign nodule, per Dr. Melida Quitter   . CATARACT EXTRACTION W/ INTRAOCULAR LENS  IMPLANT, BILATERAL Bilateral   . COLONOSCOPY    . DILATION AND CURETTAGE OF UTERUS    . ESOPHAGOGASTRODUODENOSCOPY  12-29-09   dr qadeer at [facility], several gastric ulcers  .  ESOPHAGOGASTRODUODENOSCOPY N/A 09/25/2012   Procedure: ESOPHAGOGASTRODUODENOSCOPY (EGD);  Surgeon: Beryle Beams, MD;  Location: [facility];  Service: Endoscopy;  Laterality: N/A;  . FRACTURE SURGERY    . GASTROJEJUNOSTOMY  02-20-08   Roux en Y gastrojejunsotomy w 1 foot Roux limb  . GASTROSTOMY N/A 07/26/2014   Procedure: LAPRASCOPIC ASSISTED OPEN PLACEMENT OF JEJUNOSTOMY TUBE;  Surgeon: Pedro Earls, MD;  Location: [facility];  Service: General;  Laterality: N/A;  . HERNIA REPAIR     "hiatal"  . JOINT REPLACEMENT    . LAPAROSCOPIC NISSEN FUNDOPLICATION    . LAPAROSCOPIC PARTIAL GASTRECTOMY N/A 06/29/2013   Procedure: Gastrectomy and GJ Tube placement;  Surgeon: Pedro Earls, MD;  Location: [facility];  Service: General;  Laterality: N/A;  . ORIF HIP FRACTURE Bilateral    "pins in both of them"  . REPAIR OF PERFORATED ULCER    . TONSILLECTOMY      Social History:  reports that she has been smoking Cigarettes.  She has a 100.00 pack-year smoking history. She has never used smokeless tobacco. She reports that she does not drink alcohol or use drugs.   Allergies  Allergen Reactions  . Lithium Nausea And Vomiting  . Celebrex [Celecoxib] Other (See Comments)    History of bleeding ulcers  . Morphine Itching  . Quetiapine Other (See Comments)     tardive dyskinesia  . Varenicline Tartrate Other (See Comments)    hallucinations    Family History  Problem Relation Age of Onset  . Cancer Mother     kidney, magliant tumor on face  . Stroke Mother   . Celiac disease Father   . Cancer Father     kidney and pancreatic    Prior to Admission medications   Medication Sig Start Date End Date Taking? Authorizing Provider  albuterol (PROVENTIL HFA;VENTOLIN HFA) 108 (90 Base) MCG/ACT inhaler Inhale 2 puffs into the lungs every 4 (four) hours as needed (cough, shortness of breath or wheezing.). 08/01/15  Yes Shawnee Knapp, MD  ALPRAZolam [name]) 1 MG tablet Take 1-2 tabs po qhs prn sleep. This is a  1 week supply. May fill Saturday 1/06 05/28/16  Yes Shawnee Knapp, MD  benzonatate (TESSALON) 100 MG capsule Take 2 capsules (200 mg total) by mouth 3 (three) times daily as needed for cough. 06/20/16  Yes Daleen Bo, MD  DULoxetine (CYMBALTA) 60 MG capsule TAKE (1) CAPSULE DAILY. 06/11/16  Yes Shawnee Knapp, MD  furosemide (LASIX) 20 MG tablet TAKE 1 TABLET ONCE DAILY AS NEEDED FOR EDEMA. 05/29/16  Yes Shawnee Knapp, MD  HYDROcodone-acetaminophen (NORCO) 10-325 MG tablet Take 1-2 tablets by mouth every 6 (six) hours as needed. This is a 1 week supply. Ok to fill Saturday 06/08/16 05/28/16  Yes Laurey Arrow  Brigitte Pulse, MD  ipratropium-albuterol (DUONEB) 0.5-2.5 (3) MG/3ML SOLN Take 3 mLs by nebulization 4 (four) times daily -  before meals and at bedtime. 06/03/15  Yes Robyn Haber, MD  levofloxacin (LEVAQUIN) 500 MG tablet Take 1 tablet (500 mg total) by mouth daily. 06/20/16  Yes Daleen Bo, MD  methocarbamol (ROBAXIN) 500 MG tablet Take 1-2 tablets (500-1,000 mg total) by mouth 4 (four) times daily as needed for muscle spasms. 06/18/16  Yes Shawnee Knapp, MD  OLANZapine (ZYPREXA) 20 MG tablet Take 1 tablet (20 mg total) by mouth at bedtime. 04/12/16  Yes Shawnee Knapp, MD  omeprazole (PRILOSEC) 40 MG capsule TAKE ONE CAPSULE BY MOUTH TWICE DAILY 10/10/15  Yes Shawnee Knapp, MD  ondansetron (ZOFRAN ODT) 4 MG disintegrating tablet Take 1-2 tablets (4-8 mg total) by mouth every 8 (eight) hours as needed for nausea or vomiting. 04/04/16  Yes Shawnee Knapp, MD  hydrOXYzine (ATARAX/VISTARIL) 50 MG tablet Take 1 tablet (50 mg total) by mouth 3 (three) times daily as needed for anxiety. 06/11/16   Shawnee Knapp, MD  UNABLE TO FIND Place 1 application into the nose once. Med Name: best Fit evaluation for oxygen, possibly simply go mini 08/18/15   Mancel Bale, PA-C  UNABLE TO FIND Med Name: Best fit evaluation for oxygen - possible simply go mini 08/18/15   Mancel Bale, PA-C    Physical Exam: Vitals:   06/22/16 1230 06/22/16 1300    BP: 153/94 153/89  Pulse:    Resp: (!) 30 22  Temp:       General: Well developed, thin, NAD, appears stated age  HEENT: NCAT, PERRLA, EOMI, Anicteic Sclera, mucous membranes moist.   Neck: Supple, no JVD, no masses  Cardiovascular: S1 S2 auscultated, no rubs, murmurs or gallops. Regular rate and rhythm.  Respiratory: Diffuse expiratory wheezing, limited air movement, use of accessory muscles.   Abdomen: Soft, nontender, nondistended, + bowel sounds  Extremities: warm dry without cyanosis clubbing or edema  Neuro: AAOx3, cranial nerves grossly intact. Strength 5/5 in patient's upper and lower extremities bilaterally  Skin: Without rashes exudates or nodules  Psych: Normal affect and demeanor with intact judgement and insight  Labs on Admission: I have personally reviewed following labs and imaging studies CBC:  Recent Labs Lab 06/20/16 1101 06/22/16 0835  WBC 5.9 8.6  NEUTROABS 3.9 7.2  HGB 11.0* 13.4  HCT 34.0* 41.4  MCV 92.6 92.8  PLT 240 [id number]*   Basic Metabolic Panel:  Recent Labs Lab 06/20/16 1101 06/22/16 0835  NA 142 141  K 3.3* 3.4*  CL 103 100*  CO2 28 32  GLUCOSE 108* 112*  BUN 9 14  CREATININE 0.61 0.64  CALCIUM 8.6* 8.8*   GFR: Estimated Creatinine Clearance: 56 mL/min (by C-G formula based on SCr of 0.64 mg/dL). Liver Function Tests: No results for input(s): AST, ALT, ALKPHOS, BILITOT, PROT, ALBUMIN in the last 168 hours. No results for input(s): LIPASE, AMYLASE in the last 168 hours. No results for input(s): AMMONIA in the last 168 hours. Coagulation Profile: No results for input(s): INR, PROTIME in the last 168 hours. Cardiac Enzymes:  Recent Labs Lab 06/22/16 0835  TROPONINI <0.03   BNP (last 3 results) No results for input(s): PROBNP in the last 8760 hours. HbA1C: No results for input(s): HGBA1C in the last 72 hours. CBG: No results for input(s): GLUCAP in the last 168 hours. Lipid Profile: No results for input(s): CHOL,  HDL, LDLCALC, TRIG, CHOLHDL, LDLDIRECT  in the last 72 hours. Thyroid Function Tests: No results for input(s): TSH, T4TOTAL, FREET4, T3FREE, THYROIDAB in the last 72 hours. Anemia Panel: No results for input(s): VITAMINB12, FOLATE, FERRITIN, TIBC, IRON, RETICCTPCT in the last 72 hours. Urine analysis:    Component Value Date/Time   COLORURINE YELLOW 11/11/2015 0016   APPEARANCEUR CLEAR 11/11/2015 0016   LABSPEC 1.011 11/11/2015 0016   PHURINE 6.0 11/11/2015 0016   GLUCOSEU NEGATIVE 11/11/2015 0016   HGBUR NEGATIVE 11/11/2015 0016   HGBUR negative 08/11/2009 1355   BILIRUBINUR negative 01/27/2016 1127   BILIRUBINUR neg 01/25/2015 0941   KETONESUR negative 01/27/2016 1127   KETONESUR NEGATIVE 11/11/2015 0016   PROTEINUR negative 01/27/2016 1127   PROTEINUR NEGATIVE 11/11/2015 0016   UROBILINOGEN 0.2 01/27/2016 1127   UROBILINOGEN 0.2 04/15/2014 1401   NITRITE Negative 01/27/2016 1127   NITRITE NEGATIVE 11/11/2015 0016   LEUKOCYTESUR Negative 01/27/2016 1127   Sepsis Labs: @LABRCNTIP (procalcitonin:4,lacticidven:4) )No results found for this or any previous visit (from the past 240 hour(s)).   Radiological Exams on Admission: Dg Chest Port 1 View  Result Date: 06/22/2016 CLINICAL DATA:  COPD, shortness of breath, cough, congestion for 3 days. EXAM: PORTABLE CHEST 1 VIEW COMPARISON:  06/20/2016. FINDINGS: There is hyperinflation of the lungs compatible with COPD. Heart and mediastinal contours are within normal limits. No focal opacities or effusions. No acute bony abnormality. IMPRESSION: COPD.  No active disease. Electronically Signed   By: Rolm Baptise M.D.   On: 06/22/2016 08:01    EKG: Independently reviewed. Sinus tachycardia, rate 111  Assessment/Plan  Acute COPD exacerbation/Acute hypoxic respiratory failure -Failed outpatient treatment (was in the ER 2 days ago) -O2 sats upon arrival to the emergency department noted to be 70% -CXR: shows COPD, no acute  infection -COPD orderset utilized -Will place patient on solumedrol, nebs, dulera, anti-tussives, levaquin, supplemental O2 to maintain sats above 92%  Hypokalemia -Replacing continue to monitor BMP -Check magnesium level  Anxiety/depression -Recently lost her mother. -Continue Xanax, Cymbalta, Zyprexa  GERD -Continue PPI  Tobacco abuse -Discussed smoking cessation -Nicotine patch  Chronic pain -Continue hydrocodone and Robaxin as needed, morphine for severe pain  Mild thrombocytopenia -Unknown cause, possibly viral -Continue to monitor CBC  DVT prophylaxis: Lovenox  Code Status: Full  Family Communication: Friend at bedside. Admission, patients condition and plan of care including tests being ordered have been discussed with the patient and friend who indicate understanding and agree with the plan and Code Status.  Disposition Plan: Home when stable   Consults called: None   Admission status: Inpatient   Time spent: 65 minutes  Granvel Proudfoot D.O. [facility] Pager [phone]  If 7PM-7AM, please contact night-coverage [email or web address] Password [email or web address] 06/22/2016, 1:16 PM

## 2016-06-22 NOTE — Progress Notes (Addendum)
Chaplain referred to room by nurse. Ms Daughtry is in the Emergency Room with a respiratory distress the result of COPD and stress over the death of her mother. Ms Cusick reports that a friend is the only family she has now after the death of her mother this morning. The friend is at the bedside providing emotional and physical support. Chaplain and Ms Arnow discussed honestly and directly the need to be open to admittance if needed. As hard is sounds, Ms Truss mother will be safe at the funeral home until Ms Ciccarelli can get stronger and deal with all she needs to do. There may be a need for a LCSW consult to provide additional grief support. Continued Chaplain support required.  Sallee Lange. Tykiera Raven, DMin, MDiv, MA Weekend Chaplain

## 2016-06-22 NOTE — ED Triage Notes (Signed)
Admitting MD in room with pt

## 2016-06-22 NOTE — Progress Notes (Signed)
RT called to RES B for respiratory distress. Pt arrived on CPAP per EMS. RT placed pt on 6 LPM [place]. Pt feeling much better at this time. No further orders for now. MD and RN at bedside.

## 2016-06-23 DIAGNOSIS — J9621 Acute and chronic respiratory failure with hypoxia: Principal | ICD-10-CM

## 2016-06-23 DIAGNOSIS — J441 Chronic obstructive pulmonary disease with (acute) exacerbation: Secondary | ICD-10-CM

## 2016-06-23 DIAGNOSIS — Z720 Tobacco use: Secondary | ICD-10-CM

## 2016-06-23 DIAGNOSIS — F411 Generalized anxiety disorder: Secondary | ICD-10-CM

## 2016-06-23 LAB — BASIC METABOLIC PANEL
Anion gap: 7 (ref 5–15)
BUN: 22 mg/dL — AB (ref 6–20)
CO2: 30 mmol/L (ref 22–32)
CREATININE: 0.54 mg/dL (ref 0.44–1.00)
Calcium: 8.9 mg/dL (ref 8.9–10.3)
Chloride: 99 mmol/L — ABNORMAL LOW (ref 101–111)
GFR calc Af Amer: >60 mL/min (ref >60)
GLUCOSE: 160 mg/dL — AB (ref 65–99)
POTASSIUM: 4.8 mmol/L (ref 3.5–5.1)
Sodium: 136 mmol/L (ref 135–145)

## 2016-06-23 LAB — CBC
HEMATOCRIT: 38.5 % (ref 36.0–46.0)
Hemoglobin: 12.5 g/dL (ref 12.0–15.0)
MCH: 29.9 pg (ref 26.0–34.0)
MCHC: 32.5 g/dL (ref 30.0–36.0)
MCV: 92.1 fL (ref 78.0–100.0)
Platelets: 280 10*3/uL (ref 150–400)
RBC: 4.18 MIL/uL (ref 3.87–5.11)
RDW: 14 % (ref 11.5–15.5)
WBC: 6.2 10*3/uL (ref 4.0–10.5)

## 2016-06-23 LAB — BLOOD GAS, ARTERIAL
ACID-BASE EXCESS: 4.5 mmol/L — AB (ref 0.0–2.0)
Bicarbonate: 28.1 mmol/L — ABNORMAL HIGH (ref 20.0–28.0)
Drawn by: 331471
O2 SAT: 85.9 %
PCO2 ART: 39.7 mmHg (ref 32.0–48.0)
PO2 ART: 51.3 mmHg — AB (ref 83.0–108.0)
Patient temperature: 98.6
pH, Arterial: 7.464 — ABNORMAL HIGH (ref 7.350–7.450)

## 2016-06-23 NOTE — Progress Notes (Signed)
PROGRESS NOTE  [name] [id number] DOB: 06/09/1951 DOA: 06/22/2016 PCP: [name], MD  HPI/Recap of past 54 hours: 65 year old female with past medical history of COPD and chronic respiratory failure on 2 L oxygen nasal cannula plus ongoing tobacco use admitted on 12/30 for COPD exacerbation and acute respiratory failure requiring up to 5 L nasal cannula. Patient started on IV steroids, nebulizers and additional supplemental oxygen.  Today patient is feeling somewhat better, less wheezing. She still feels very weak.   Assessment/Plan: Active Problems:   Anxiety state continue when necessary benzo   Chronic abdominal pain   Tobacco abuse: Continue nicotine patch, patient encouraged to quit    Acute on chronic respiratory failure with hypoxia secondary to COPD exacerbation ([place]): Work on trending oxygen down. Patient on 5 L this morning are 98%. We'll take her down to 4 L and eventually continue to wean. Check ABG in the morning. Blood gas done off of oxygen noted hypoxia.  Weakness: Seen by PT, we'll set up home health physical therapy  Code Status: Full code   Family Communication: Patient with no family, lives with a roommate   Disposition Plan: Anticipate discharge tomorrow    Consultants:  None   Procedures:  None   Antimicrobials:  Levaquin 12/30-present   DVT prophylaxis:  Lovenox   Objective: Vitals:   06/22/16 2038 06/23/16 0451 06/23/16 0752 06/23/16 1151  BP: (!) 144/82 (!) 147/77    Pulse: 70 72 72 75  Resp: 18 20 17 17   Temp: 97.8 F (36.6 C) 98.2 F (36.8 C)    TempSrc: Oral Oral    SpO2: 99% 98% 96% 94%  Weight:      Height:        Intake/Output Summary (Last 24 hours) at 06/23/16 1310 Last data filed at 06/23/16 0600  Gross per 24 hour  Intake              720 ml  Output                0 ml  Net              720 ml   Filed Weights   06/22/16 0743  Weight: 49.9 kg (110 lb)    Exam:   General:  Alert and oriented 3, no  acute distress   Cardiovascular: Regular rate and rhythm, S1-S2   Respiratory: Decreased breath sounds throughout   Abdomen: Soft, nontender, nondistended, positive bowel sounds   Musculoskeletal: No clubbing or cyanosis or edema   Skin: No skin breaks, tears or lesions  Psychiatry: Patient is appropriate, no evidence of psychoses    Data Reviewed: CBC:  Recent Labs Lab 06/20/16 1101 06/22/16 0835 06/23/16 0518  WBC 5.9 8.6 6.2  NEUTROABS 3.9 7.2  --   HGB 11.0* 13.4 12.5  HCT 34.0* 41.4 38.5  MCV 92.6 92.8 92.1  PLT 240 129* [id number]   Basic Metabolic Panel:  Recent Labs Lab 06/20/16 1101 06/22/16 0835 06/23/16 0518  NA 142 141 136  K 3.3* 3.4* 4.8  CL 103 100* 99*  CO2 28 32 30  GLUCOSE 108* 112* 160*  BUN 9 14 22*  CREATININE 0.61 0.64 0.54  CALCIUM 8.6* 8.8* 8.9  MG  --  3.3*  --    GFR: Estimated Creatinine Clearance: 56 mL/min (by C-G formula based on SCr of 0.54 mg/dL). Liver Function Tests: No results for input(s): AST, ALT, ALKPHOS, BILITOT, PROT, ALBUMIN in the last 168 hours. No results  for input(s): LIPASE, AMYLASE in the last 168 hours. No results for input(s): AMMONIA in the last 168 hours. Coagulation Profile: No results for input(s): INR, PROTIME in the last 168 hours. Cardiac Enzymes:  Recent Labs Lab 06/22/16 0835  TROPONINI <0.03   BNP (last 3 results) No results for input(s): PROBNP in the last 8760 hours. HbA1C: No results for input(s): HGBA1C in the last 72 hours. CBG: No results for input(s): GLUCAP in the last 168 hours. Lipid Profile: No results for input(s): CHOL, HDL, LDLCALC, TRIG, CHOLHDL, LDLDIRECT in the last 72 hours. Thyroid Function Tests: No results for input(s): TSH, T4TOTAL, FREET4, T3FREE, THYROIDAB in the last 72 hours. Anemia Panel: No results for input(s): VITAMINB12, FOLATE, FERRITIN, TIBC, IRON, RETICCTPCT in the last 72 hours. Urine analysis:    Component Value Date/Time   COLORURINE YELLOW 11/11/2015  0016   APPEARANCEUR CLEAR 11/11/2015 0016   LABSPEC 1.011 11/11/2015 0016   PHURINE 6.0 11/11/2015 0016   GLUCOSEU NEGATIVE 11/11/2015 0016   HGBUR NEGATIVE 11/11/2015 0016   HGBUR negative 08/11/2009 1355   BILIRUBINUR negative 01/27/2016 1127   BILIRUBINUR neg 01/25/2015 0941   KETONESUR negative 01/27/2016 1127   KETONESUR NEGATIVE 11/11/2015 0016   PROTEINUR negative 01/27/2016 1127   PROTEINUR NEGATIVE 11/11/2015 0016   UROBILINOGEN 0.2 01/27/2016 1127   UROBILINOGEN 0.2 04/15/2014 1401   NITRITE Negative 01/27/2016 1127   NITRITE NEGATIVE 11/11/2015 0016   LEUKOCYTESUR Negative 01/27/2016 1127   Sepsis Labs: @LABRCNTIP (procalcitonin:4,lacticidven:4)  )No results found for this or any previous visit (from the past 240 hour(s)).    Studies: No results found.  Scheduled Meds: . arformoterol  15 mcg Nebulization BID  . benzonatate  200 mg Oral TID  . DULoxetine  60 mg Oral Daily  . enoxaparin (LOVENOX) injection  40 mg Subcutaneous Q24H  . furosemide  20 mg Oral Daily  . ipratropium-albuterol  3 mL Nebulization QID  . levofloxacin  500 mg Oral Daily  . mometasone-formoterol  2 puff Inhalation BID  . nicotine  21 mg Transdermal Daily  . OLANZapine  20 mg Oral QHS  . pantoprazole  40 mg Oral Daily  . predniSONE  40 mg Oral Q supper  . sodium chloride flush  3 mL Intravenous Q12H  . sodium chloride flush  3 mL Intravenous Q12H    Continuous Infusions:   LOS: 1 day    Annita Brod, MD [facility] Pager [phone]  If 7PM-7AM, please contact night-coverage [email or web address] Password [email or web address] 06/23/2016, 1:10 PM

## 2016-06-23 NOTE — Evaluation (Signed)
Occupational Therapy Evaluation Patient Details Name: [name] MRN: [id number] DOB: 11/24/1950 Today's Date: 06/23/2016    History of Present Illness [name] is a 65 y.o. female with a medical history of COPD, arthritis, GERD, anxiety, depression, B ORIF, B cataracts with lens , macular degeneration, fibula fx.  presented to the emergency department 12/30 and 2 days prior with complaints of shortness of breath. Patient found to have COPD exacerbation    Clinical Impression   Pt. Is at baseline for ADLs and transfer to chair and commode. Pt. States she is always SOB and on home O2. Pt. States that she can do everything for herself.     Follow Up Recommendations  No OT follow up    Equipment Recommendations  None recommended by OT    Recommendations for Other Services       Precautions / Restrictions Precautions Precautions: Fall Restrictions Weight Bearing Restrictions: No      Mobility Bed Mobility Overal bed mobility: Modified Independent                Transfers                 General transfer comment:  (Mod I with stand pivot bed to [facility].)    Balance                                            ADL Overall ADL's : At baseline                                       General ADL Comments: Pt. is Mod I with LE dresssing and transfer to [facility]. Pt. states she is normally has decreased balance and thats why she uses rollator. Pt. agrees she is at baseline performance.      Vision     Perception     Praxis      Pertinent Vitals/Pain Pain Assessment: 0-10 Pain Score: 4  Pain Location:  (all over) Pain Descriptors / Indicators: Aching Pain Intervention(s): Premedicated before session     Hand Dominance     Extremity/Trunk Assessment Upper Extremity Assessment Upper Extremity Assessment: Overall WFL for tasks assessed           Communication Communication Communication: No difficulties    Cognition Arousal/Alertness: Awake/alert Behavior During Therapy: WFL for tasks assessed/performed Overall Cognitive Status: Within Functional Limits for tasks assessed                     General Comments       Exercises       Shoulder Instructions      Home Living Family/patient expects to be discharged to:: Private residence Living Arrangements: Non-relatives/Friends Available Help at Discharge: Friend(s) Type of Home: House Home Access: Stairs to enter [employer] of Steps: 1 in front   Home Layout: One level     Bathroom Shower/Tub: Occupational psychologist: Standard     Home Equipment: Environmental consultant - 2 wheels;Walker - 4 wheels;Shower seat;Wheelchair - manual   Additional Comments:  (Pt. becomes SOB with minimal activity on O2 5L)      Prior Functioning/Environment Level of Independence: Independent with assistive device(s)        Comments:  (Pt. states she has  diffiuclty with ADLs because of SOB.)        OT Problem List:     OT Treatment/Interventions:      OT Goals(Current goals can be found in the care plan section) Acute Rehab OT Goals Patient Stated Goal: go home  OT Frequency:     Barriers to D/C:            Co-evaluation              End of Session Equipment Utilized During Treatment: Oxygen  Activity Tolerance: Patient tolerated treatment well Patient left: in bed;with call bell/phone within reach   Time: 0825-0908 OT Time Calculation (min): 43 min Charges:  OT General Charges $OT Visit: 1 Procedure OT Evaluation $OT Eval Low Complexity: 1 Procedure OT Treatments $Self Care/Home Management : 23-37 mins G-Codes:    Elias Bordner July 08, 2016, 9:08 AM

## 2016-06-23 NOTE — Evaluation (Signed)
Physical Therapy Evaluation Patient Details Name: [name] MRN: [id number] DOB: [birth year] Today's Date: 06/23/2016   History of Present Illness  [name] is a 65 y.o. female with a medical history of COPD, arthritis, GERD, anxiety, depression, B ORIF, B cataracts with lens , macular degeneration, fibula fx.  presented to the emergency department 12/30 and 2 days prior with complaints of shortness of breath. Patient found to have COPD exacerbation   Clinical Impression  Thepatient ambulated on 2 liters [place] x 400+ feet with RW. RN reduced the  From 5 to 2 liters.  Sats throughout >90 until very end, dropped to **%. HR max 127 . Instructed in pursed lip breaths. Pt admitted with above diagnosis. Pt currently with functional limitations due to the deficits listed below (see PT Problem List).  Pt will benefit from skilled PT to increase their independence and safety with mobility to allow discharge to the venue listed below.     Follow Up Recommendations Home health PT;Supervision/Assistance - 24 hour    Equipment Recommendations  None recommended by PT    Recommendations for Other Services       Precautions / Restrictions Precautions Precautions: Fall Precaution Comments: monitor sats Restrictions Weight Bearing Restrictions: No      Mobility  Bed Mobility Overal bed mobility: Modified Independent                Transfers Overall transfer level: Modified independent               General transfer comment:  (Mod I with stand pivot bed to [facility].)  Ambulation/Gait Ambulation/Gait assistance: Supervision Ambulation Distance (Feet): 420 Feet Assistive device: Rolling walker (2 wheeled) Gait Pattern/deviations: Step-through pattern   Gait velocity interpretation: at or above normal speed for age/gender General Gait Details: no external assistance  Stairs            Wheelchair Mobility    Modified Rankin (Stroke Patients Only)       Balance  Overall balance assessment: Modified Independent                                           Pertinent Vitals/Pain Pain Assessment: 0-10 Pain Score: 4  Pain Location: all over Pain Descriptors / Indicators: Aching Pain Intervention(s): Premedicated before session    Home Living Family/patient expects to be discharged to:: Private residence Living Arrangements: Non-relatives/Friends Available Help at Discharge: Friend(s) Type of Home: House Home Access: Stairs to enter   [employer] of Steps: 1 in front Home Layout: One level Home Equipment: Environmental consultant - 2 wheels;Walker - 4 wheels;Shower seat;Wheelchair - manual Additional Comments:  (  Prior Function Level of Independence: Independent with assistive device(s)         Comments:  (Pt. states she has diffiuclty with ADLs because of SOB.)     Hand Dominance        Extremity/Trunk Assessment   Upper Extremity Assessment Upper Extremity Assessment: Defer to OT evaluation    Lower Extremity Assessment Lower Extremity Assessment: Overall WFL for tasks assessed    Cervical / Trunk Assessment Cervical / Trunk Assessment: Kyphotic  Communication   Communication: No difficulties  Cognition Arousal/Alertness: Awake/alert Behavior During Therapy: WFL for tasks assessed/performed;Impulsive Overall Cognitive Status: Within Functional Limits for tasks assessed  General Comments      Exercises     Assessment/Plan    PT Assessment Patient needs continued PT services  PT Problem List Decreased activity tolerance;Cardiopulmonary status limiting activity;Decreased mobility          PT Treatment Interventions DME instruction;Gait training;Functional mobility training;Therapeutic activities;Patient/family education    PT Goals (Current goals can be found in the Care Plan section)  Acute Rehab PT Goals Patient Stated Goal: go home PT Goal Formulation: With  patient/family Time For Goal Achievement: 06/30/16 Potential to Achieve Goals: Good    Frequency Min 3X/week   Barriers to discharge        Co-evaluation               End of Session Equipment Utilized During Treatment: Oxygen Activity Tolerance: Patient tolerated treatment well Patient left: in bed;with call bell/phone within reach;with bed alarm set Nurse Communication: Mobility status         Time: [id number] PT Time Calculation (min) (ACUTE ONLY): 20 min   Charges:   PT Evaluation $PT Eval Low Complexity: 1 Procedure     PT G CodesClaretha Cooper 06/23/2016, 11:30 AM  Tresa Endo PT [phone]

## 2016-06-24 DIAGNOSIS — J9601 Acute respiratory failure with hypoxia: Secondary | ICD-10-CM

## 2016-06-24 MED ORDER — NICOTINE 21 MG/24HR TD PT24: 21.0000 mg | MEDICATED_PATCH | Freq: Every day | TRANSDERMAL | 0 refills | Status: DC

## 2016-06-24 MED ORDER — LEVOFLOXACIN 500 MG PO TABS: 500.0000 mg | ORAL_TABLET | Freq: Every day | ORAL | 0 refills | Status: AC

## 2016-06-24 MED ORDER — PREDNISONE 10 MG PO TABS: ORAL_TABLET | ORAL | 0 refills | Status: DC

## 2016-06-24 NOTE — Discharge Summary (Signed)
Discharge Summary  [name] [id number] DOB: [birth year]  PCP: [name], MD  Admit date: 06/22/2016 Discharge date: 06/24/2016  Time spent: 25 minutes   Recommendations for Outpatient Follow-up:  1. New medication: Prednisone taper 2. Patient will follow-up with her PCP in the next month 3. She is advised to be on 2 L oxygen by nasal cannula at rest, 3 L with activity 4. She is bruising prescribed Levaquin prior to admission. She will continue this, but only until 1/3  Discharge Diagnoses:  Active Hospital Problems   Diagnosis Date Noted  . COPD exacerbation ([place]) 06/22/2016  . Hypoxia 04/08/2015  . Tobacco abuse   . Chronic abdominal pain   . Anxiety state 01/07/2007    Resolved Hospital Problems   Diagnosis Date Noted Date Resolved  No resolved problems to display.    Discharge Condition: Improved, being discharged home   Diet recommendation: Low-sodium   Vitals:   06/23/16 2127 06/24/16 0428  BP: 138/74 138/73  Pulse: 89 75  Resp: 18 14  Temp: 97.7 F (36.5 C) 98 F (36.7 C)    History of present illness:  66 year old female with past medical history of COPD and chronic respiratory failure on 2 L oxygen nasal cannula plus ongoing tobacco use admitted on 12/30 for COPD exacerbation and acute respiratory failure requiring up to 5 L nasal cannula. Patient started on IV steroids, nebulizers and additional supplemental oxygen.   Hospital Course:  Active Problems:   Anxiety state: Reassurance.   Chronic abdominal pain   Tobacco abuse: Patient counseled and encouraged to quit. Prescription given for nicotine patch.    Acute on chronic respiratory failure with hypoxia secondary to COPD exacerbation ([place]): Over the next 2 days, patient continued to improve. By 1/1, down to 2 L and with ambulation, at times dipped down to 86% so we'll recommend that she increase to 3 L with activity.  Procedures:  None   Consultations:  None   Discharge Exam: BP 138/73  (BP Location: Left Arm)   Pulse 75   Temp 98 F (36.7 C) (Oral)   Resp 14   Ht 5\' 4"  (1.626 m)   Wt 49.9 kg (110 lb)   SpO2 95%   BMI 18.88 kg/m   General: Alert and oriented 3, no acute distress  Cardiovascular: Regular rate and rhythm, S1-S2  Respiratory: Decreased breath sounds throughout   Discharge Instructions You were cared for by a hospitalist during your hospital stay. If you have any questions about your discharge medications or the care you received while you were in the hospital after you are discharged, you can call the unit and asked to speak with the hospitalist on call if the hospitalist that took care of you is not available. Once you are discharged, your primary care physician will handle any further medical issues. Please note that NO REFILLS for any discharge medications will be authorized once you are discharged, as it is imperative that you return to your primary care physician (or establish a relationship with a primary care physician if you do not have one) for your aftercare needs so that they can reassess your need for medications and monitor your lab values.  Discharge Instructions    Diet - low sodium heart healthy    Complete by:  As directed    Increase activity slowly    Complete by:  As directed      Allergies as of 06/24/2016      Reactions   Lithium Nausea And  Vomiting   Celebrex [celecoxib] Other (See Comments)   History of bleeding ulcers   Morphine Itching   Quetiapine Other (See Comments)    tardive dyskinesia   Varenicline Tartrate Other (See Comments)   hallucinations      Medication List    TAKE these medications   albuterol 108 (90 Base) MCG/ACT inhaler Commonly known as:  PROVENTIL HFA;VENTOLIN HFA Inhale 2 puffs into the lungs every 4 (four) hours as needed (cough, shortness of breath or wheezing.).   ALPRAZolam 1 MG tablet Commonly known as:  XANAX Take 1-2 tabs po qhs prn sleep. This is a 1 week supply. May fill Saturday  1/06   benzonatate 100 MG capsule Commonly known as:  TESSALON Take 2 capsules (200 mg total) by mouth 3 (three) times daily as needed for cough.   DULoxetine 60 MG capsule Commonly known as:  CYMBALTA TAKE (1) CAPSULE DAILY.   furosemide 20 MG tablet Commonly known as:  LASIX TAKE 1 TABLET ONCE DAILY AS NEEDED FOR EDEMA.   HYDROcodone-acetaminophen 10-325 MG tablet Commonly known as:  NORCO Take 1-2 tablets by mouth every 6 (six) hours as needed. This is a 1 week supply. Ok to fill Saturday 06/08/16   hydrOXYzine 50 MG tablet Commonly known as:  ATARAX/VISTARIL Take 1 tablet (50 mg total) by mouth 3 (three) times daily as needed for anxiety.   ipratropium-albuterol 0.5-2.5 (3) MG/3ML Soln Commonly known as:  DUONEB Take 3 mLs by nebulization 4 (four) times daily -  before meals and at bedtime.   levofloxacin 500 MG tablet Commonly known as:  LEVAQUIN Take 1 tablet (500 mg total) by mouth daily.   methocarbamol 500 MG tablet Commonly known as:  ROBAXIN Take 1-2 tablets (500-1,000 mg total) by mouth 4 (four) times daily as needed for muscle spasms.   nicotine 21 mg/24hr patch Commonly known as:  NICODERM CQ - dosed in mg/24 hours Place 1 patch (21 mg total) onto the skin daily. Start taking on:  06/25/2016   OLANZapine 20 MG tablet Commonly known as:  ZYPREXA Take 1 tablet (20 mg total) by mouth at bedtime.   omeprazole 40 MG capsule Commonly known as:  PRILOSEC TAKE ONE CAPSULE BY MOUTH TWICE DAILY   ondansetron 4 MG disintegrating tablet Commonly known as:  ZOFRAN ODT Take 1-2 tablets (4-8 mg total) by mouth every 8 (eight) hours as needed for nausea or vomiting.   predniSONE 10 MG tablet Commonly known as:  DELTASONE 40 mg po on day 1, then 30mg  on day 2, then 20 mg on day 3, then 10mg  on day 4, then stop.      Allergies  Allergen Reactions  . Lithium Nausea And Vomiting  . Celebrex [Celecoxib] Other (See Comments)    History of bleeding ulcers  .  Morphine Itching  . Quetiapine Other (See Comments)     tardive dyskinesia  . Varenicline Tartrate Other (See Comments)    hallucinations   Follow-up Information    SHAW,EVA, MD Follow up in 1 month(s).   Specialty:  Family Medicine Contact information: [place] [id number] [phone]            The results of significant diagnostics from this hospitalization (including imaging, microbiology, ancillary and laboratory) are listed below for reference.    Significant Diagnostic Studies: Dg Chest 2 View  Result Date: 06/20/2016 CLINICAL DATA:  Worsening cough, congestion, shortness of breath. EXAM: CHEST  2 VIEW COMPARISON:  08/07/2015 FINDINGS: There is hyperinflation  of the lungs compatible with COPD. Heart and mediastinal contours are within normal limits. No focal opacities or effusions. No acute bony abnormality. IMPRESSION: No active cardiopulmonary disease. COPD. Electronically Signed   By: Rolm Baptise M.D.   On: 06/20/2016 11:39   Dg Chest Port 1 View  Result Date: 06/22/2016 CLINICAL DATA:  COPD, shortness of breath, cough, congestion for 3 days. EXAM: PORTABLE CHEST 1 VIEW COMPARISON:  06/20/2016. FINDINGS: There is hyperinflation of the lungs compatible with COPD. Heart and mediastinal contours are within normal limits. No focal opacities or effusions. No acute bony abnormality. IMPRESSION: COPD.  No active disease. Electronically Signed   By: Rolm Baptise M.D.   On: 06/22/2016 08:01    Microbiology: No results found for this or any previous visit (from the past 240 hour(s)).   Labs: Basic Metabolic Panel:  Recent Labs Lab 06/20/16 1101 06/22/16 0835 06/23/16 0518  NA 142 141 136  K 3.3* 3.4* 4.8  CL 103 100* 99*  CO2 28 32 30  GLUCOSE 108* 112* 160*  BUN 9 14 22*  CREATININE 0.61 0.64 0.54  CALCIUM 8.6* 8.8* 8.9  MG  --  3.3*  --    Liver Function Tests: No results for input(s): AST, ALT, ALKPHOS, BILITOT, PROT, ALBUMIN in the last  168 hours. No results for input(s): LIPASE, AMYLASE in the last 168 hours. No results for input(s): AMMONIA in the last 168 hours. CBC:  Recent Labs Lab 06/20/16 1101 06/22/16 0835 06/23/16 0518  WBC 5.9 8.6 6.2  NEUTROABS 3.9 7.2  --   HGB 11.0* 13.4 12.5  HCT 34.0* 41.4 38.5  MCV 92.6 92.8 92.1  PLT 240 129* 280   Cardiac Enzymes:  Recent Labs Lab 06/22/16 0835  TROPONINI <0.03   BNP: BNP (last 3 results) No results for input(s): BNP in the last 8760 hours.  ProBNP (last 3 results) No results for input(s): PROBNP in the last 8760 hours.  CBG: No results for input(s): GLUCAP in the last 168 hours.     Signed:  Annita Brod, MD [facility] 06/24/2016, 11:27 AM

## 2016-06-24 NOTE — Progress Notes (Signed)
Patient would like to talk to the doctor about the blood gas that was ordered. Patient does not want to have it done.

## 2016-06-24 NOTE — Progress Notes (Signed)
Physical Therapy Treatment Patient Details Name: [name] MRN: [id number] DOB: 29-Dec-1950 Today's Date: Jul 21, 2016    History of Present Illness [name] is a 66 y.o. female with a medical history of COPD, arthritis, GERD, anxiety, depression, B ORIF, B cataracts with lens , macular degeneration, fibula fx.  presented to the emergency department 12/30 and 2 days prior with complaints of shortness of breath. Patient found to have COPD exacerbation     PT Comments    Pt ambulated in hallway again and monitored SpO2 as below (mobility section).  Pt likely to d/c home today.  Follow Up Recommendations  Home health PT;Supervision/Assistance - 24 hour     Equipment Recommendations  None recommended by PT    Recommendations for Other Services       Precautions / Restrictions Precautions Precautions: Fall Precaution Comments: monitor sats    Mobility  Bed Mobility Overal bed mobility: Modified Independent                Transfers Overall transfer level: Modified independent                  Ambulation/Gait Ambulation/Gait assistance: Supervision Ambulation Distance (Feet): 420 Feet Assistive device: Rolling walker (2 wheeled) Gait Pattern/deviations: Step-through pattern;Decreased stride length     General Gait Details: no external assistance, monitored SPO2 which varied however utilized pulse ox on fingernail with no polish which read 87% on 2L O2 (other one reading 79% at lowest) MD in hallway and aware   Stairs            Wheelchair Mobility    Modified Rankin (Stroke Patients Only)       Balance                                    Cognition Arousal/Alertness: Awake/alert Behavior During Therapy: WFL for tasks assessed/performed;Impulsive Overall Cognitive Status: Within Functional Limits for tasks assessed                      Exercises      General Comments        Pertinent Vitals/Pain Pain  Assessment: 0-10 Pain Score: 2  Pain Location: all over Pain Descriptors / Indicators: Aching Pain Intervention(s): Monitored during session  SPO2 93% on 2L O2 [place] at rest    Home Living                      Prior Function            PT Goals (current goals can now be found in the care plan section) Progress towards PT goals: Progressing toward goals    Frequency    Min 3X/week      PT Plan Current plan remains appropriate    Co-evaluation             End of Session Equipment Utilized During Treatment: Oxygen Activity Tolerance: Patient tolerated treatment well Patient left: in bed;with call bell/phone within reach;with family/visitor present     Time: [id number] PT Time Calculation (min) (ACUTE ONLY): 24 min  Charges:  $Gait Training: 8-22 mins                    G Codes:      Selene Peltzer,KATHrine E 21-Jul-2016, 12:51 PM Carmelia Bake, PT, DPT 07/21/16 Pager: [phone]

## 2016-06-24 NOTE — Discharge Instructions (Signed)
Oxygen at 2 L at rest, 3 L when moving    Chronic Obstructive Pulmonary Disease Chronic obstructive pulmonary disease (COPD) is a common lung problem. In COPD, the flow of air from the lungs is limited. The way your lungs work will probably never return to normal, but there are things you can do to improve your lungs and make yourself feel better. Your doctor may treat your condition with:  Medicines.  Oxygen.  Lung surgery.  Changes to your diet.  Rehabilitation. This may involve a team of specialists. Follow these instructions at home:  Take all medicines as told by your doctor.  Avoid medicines or cough syrups that dry up your airway (such as antihistamines) and do not allow you to get rid of thick spit. You do not need to avoid them if told differently by your doctor.  If you smoke, stop. Smoking makes the problem worse.  Avoid being around things that make your breathing worse (like smoke, chemicals, and fumes).  Use oxygen therapy and therapy to help improve your lungs (pulmonary rehabilitation) if told by your doctor. If you need home oxygen therapy, ask your doctor if you should buy a tool to measure your oxygen level (oximeter).  Avoid people who have a sickness you can catch (contagious).  Avoid going outside when it is very hot, cold, or humid.  Eat healthy foods. Eat smaller meals more often. Rest before meals.  Stay active, but remember to also rest.  Make sure to get all the shots (vaccines) your doctor recommends. Ask your doctor if you need a pneumonia shot.  Learn and use tips on how to relax.  Learn and use tips on how to control your breathing as told by your doctor. Try: 1. Breathing in (inhaling) through your nose for 1 second. Then, pucker your lips and breath out (exhale) through your lips for 2 seconds. 2. Putting one hand on your belly (abdomen). Breathe in slowly through your nose for 1 second. Your hand on your belly should move out. Pucker your  lips and breathe out slowly through your lips. Your hand on your belly should move in as you breathe out.  Learn and use controlled coughing to clear thick spit from your lungs. The steps are: 1. Lean your head a little forward. 2. Breathe in deeply. 3. Try to hold your breath for 3 seconds. 4. Keep your mouth slightly open while coughing 2 times. 5. Spit any thick spit out into a tissue. 6. Rest and do the steps again 1 or 2 times as needed. Contact a doctor if:  You cough up more thick spit than usual.  There is a change in the color or thickness of the spit.  It is harder to breathe than usual.  Your breathing is faster than usual. Get help right away if:  You have shortness of breath while resting.  You have shortness of breath that stops you from:  Being able to talk.  Doing normal activities.  You chest hurts for longer than 5 minutes.  Your skin color is more blue than usual.  Your pulse oximeter shows that you have low oxygen for longer than 5 minutes. This information is not intended to replace advice given to you by your health care provider. Make sure you discuss any questions you have with your health care provider. Document Released: 11/27/2007 Document Revised: 11/16/2015 Document Reviewed: 02/04/2013 Elsevier Interactive Patient Education  2017 [employer].

## 2016-06-29 ENCOUNTER — Ambulatory Visit (INDEPENDENT_AMBULATORY_CARE_PROVIDER_SITE_OTHER): Payer: Medicare Other | Admitting: Family Medicine

## 2016-06-29 VITALS — BP 152/78 | HR 88 | Temp 99.2°F | Resp 18 | Ht 64.0 in | Wt 110.0 lb

## 2016-06-29 DIAGNOSIS — J431 Panlobular emphysema: Secondary | ICD-10-CM

## 2016-06-29 DIAGNOSIS — J441 Chronic obstructive pulmonary disease with (acute) exacerbation: Secondary | ICD-10-CM | POA: Diagnosis not present

## 2016-06-29 MED ORDER — IPRATROPIUM BROMIDE 0.02 % IN SOLN
0.5000 mg | Freq: Once | RESPIRATORY_TRACT | Status: AC
  Administered 2016-06-29: 0.5 mg via RESPIRATORY_TRACT

## 2016-06-29 MED ORDER — ALBUTEROL SULFATE (2.5 MG/3ML) 0.083% IN NEBU
2.5000 mg | INHALATION_SOLUTION | Freq: Once | RESPIRATORY_TRACT | Status: AC
  Administered 2016-06-29: 2.5 mg via RESPIRATORY_TRACT

## 2016-06-29 MED ORDER — IPRATROPIUM-ALBUTEROL 0.5-2.5 (3) MG/3ML IN SOLN: 3.0000 mL | Freq: Three times a day (TID) | RESPIRATORY_TRACT | 3 refills | Status: DC

## 2016-06-29 NOTE — Progress Notes (Signed)
Subjective:  By signing my name below, I, [name], attest that this documentation has been prepared under the direction and in the presence of [name] Cheadle, MD Electronically Signed: Ladene [name], ED Scribe 06/29/2016 at 11:24 AM.   Patient ID: [name], female    DOB: 1951/04/08, 66 y.o.   MRN: [id number]  Chief Complaint  Patient presents with  . Follow-up    Hospital follow up  . Medication Refill  . Cough   HPI HPI Comments: [name] is a 66 y.o. female who presents to the [facility] for a hospital follow-up. Pt was initially seen in ER for cough. EMS was called out to her house, treated her with nebulizer, solumedrol and oxygen. Her O2 dropped to 81%, however after the ER she substantially improved and was sent home with Levaquin 500 qad and prednisone 16 mg 16 dose pack. Levaquin was 1 week. She then returned to ER 2 days later with worsening symptoms. She was short of breath at rest and tachypneic so was admitted for a 2 day hospitalization. Discharged on 2 L of O2 at rest, 3 L with activity and advised to finish Levaquin course, which was completed 3 days prior. O2 stat was dropping to 86% with ambulation in the hospital. BMP and CBC were normal though magnesium was high at 3.3 with normal high of 2.4. CXR on admission showed COPD.    Today, pt states that she is still experiencing a productive cough. She reports associated symptoms of nasal congestion, rhinorrhea, mild fever, chills, intermittent wheezing. Triage temperature: 99.2 F. She has used her Atrovent and albuterol inhalers yesterday everning with improvement but requests a refill. She denies loss of appetite.   Anxiety  Pt states that her mother passed while she was in the hospital towards the end of December. Pt's current medications include Hydroxyzine 50 PRN for anxiety and Cymbalta. Rx refills for today waiting at [facility] but will need repeat refill throughout the week.   Past Medical  History:  Diagnosis Date  . ACUTE DUODEN ULCER W/HEMORR PERF&OBSTRUCTION 06/26/2004   Qualifier: Diagnosis of  By: [name] MD, [name]   . Acute duodenal ulcer with hemorrhage and perforation, with obstruction ([place])   . Allergy   . Anemia   . Anginal pain ([place])    "many many years ago"  . Anxiety    sees [name] NP at Dr. Radonna Ricker office  . Arthritis    "hips" (09/06/2014)  . Asthma   . Barrett's esophagus   . Chronic abdominal pain    narcotic dependence, dr [facility]eng-dak [facility]  . 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 Complication of anesthesia    pt states had too much xanax on board - agitated   . COPD (chronic obstructive pulmonary disease) ([place])   . Depression   . Exertional shortness of breath   . FEVER, RECURRENT 08/11/2009   Qualifier: Diagnosis of  By: [name] MD, [name]   . Fx of fibula 07-28-11   left fibula, 2 places   . Fx two ribs-open 08-24-11   left 4th and 5th  . GASTRIC OUTLET OBSTRUCTION 08/28/2007   In July 2008 she underwent laparoscopic enterolysis, Nissen fundoplication over a 99991111 bougie, single pledgeted suture colosure of the hiatus and a 3 suture wrap.  Lap truncal vagotomy and a loop gastrojejunostomy was performed.  This was revised in August of 2009 to a roux en Y  gastrojejujostomy.     . Gastroparesis 06/03/2007   Qualifier: Diagnosis of  By: [name] MD, [name]   . GERD (gastroesophageal reflux disease)   . Headache    "monthly" (09/06/2014)  . History of blood transfusion    "related to OR; maybe when I perforated that ulcer"  . History of hiatal hernia   . Lap Nissen + truncal vagotomy July 2008 05/13/2013  . LEG EDEMA 01/19/2008   Qualifier: Diagnosis of  By: [name] MD, [name]   . Menopause   . MENOPAUSE 01/07/2007   Qualifier: Diagnosis of  By: [name], CMA, [place]    . Myocardial infarction   . Narcotic abuse   . Peptic ulcer disease    dr Oletta Lamas  . Pneumonia, organism unspecified(486) 11/07/2010    Assoc with R Parapneumonic effusion 09/2010    - Tapped 10/05/10    - CxR resolved 11/07/2010    . S/P jejunostomy Feb 2016 07/26/2014   Current Outpatient Prescriptions on File Prior to Visit  Medication Sig Dispense Refill  . albuterol (PROVENTIL HFA;VENTOLIN HFA) 108 (90 Base) MCG/ACT inhaler Inhale 2 puffs into the lungs every 4 (four) hours as needed (cough, shortness of breath or wheezing.). 1 Inhaler 3  . ALPRAZolam (XANAX) 1 MG tablet Take 1-2 tabs po qhs prn sleep. This is a 1 week supply. May fill Saturday 1/06 14 tablet 0  . DULoxetine (CYMBALTA) 60 MG capsule TAKE (1) CAPSULE DAILY. 30 capsule 0  . furosemide (LASIX) 20 MG tablet TAKE 1 TABLET ONCE DAILY AS NEEDED FOR EDEMA. 30 tablet 0  . HYDROcodone-acetaminophen (NORCO) 10-325 MG tablet Take 1-2 tablets by mouth every 6 (six) hours as needed. This is a 1 week supply. Ok to fill Saturday 06/08/16 60 tablet 0  . hydrOXYzine (ATARAX/VISTARIL) 50 MG tablet Take 1 tablet (50 mg total) by mouth 3 (three) times daily as needed for anxiety. 30 tablet 0  . ipratropium-albuterol (DUONEB) 0.5-2.5 (3) MG/3ML SOLN Take 3 mLs by nebulization 4 (four) times daily -  before meals and at bedtime. 360 mL 3  . methocarbamol (ROBAXIN) 500 MG tablet Take 1-2 tablets (500-1,000 mg total) by mouth 4 (four) times daily as needed for muscle spasms. 60 tablet 0  . nicotine (NICODERM CQ - DOSED IN MG/24 HOURS) 21 mg/24hr patch Place 1 patch (21 mg total) onto the skin daily. 28 patch 0  . OLANZapine (ZYPREXA) 20 MG tablet Take 1 tablet (20 mg total) by mouth at bedtime. 30 tablet 2  . omeprazole (PRILOSEC) 40 MG capsule TAKE ONE CAPSULE BY MOUTH TWICE DAILY 180 capsule 3  . ondansetron (ZOFRAN ODT) 4 MG disintegrating tablet Take 1-2 tablets (4-8 mg total) by mouth every 8 (eight) hours as needed for nausea or vomiting. 180 tablet 0  . predniSONE (DELTASONE) 10 MG tablet 40 mg po on day 1, then 30mg  on day 2, then 20 mg on day 3, then 10mg  on day 4, then stop. 10  tablet 0   No current facility-administered medications on file prior to visit.    Allergies  Allergen Reactions  . Lithium Nausea And Vomiting  . Celebrex [Celecoxib] Other (See Comments)    History of bleeding ulcers  . Morphine Itching  . Quetiapine Other (See Comments)     tardive dyskinesia  . Varenicline Tartrate Other (See Comments)    hallucinations   Review of Systems  Constitutional: Positive for chills and fever. Negative for appetite change.  HENT: Positive for congestion and rhinorrhea.  Respiratory: Positive for cough and wheezing.   Gastrointestinal: Positive for abdominal pain. Negative for vomiting.  Psychiatric/Behavioral: Positive for behavioral problems, confusion, decreased concentration, dysphoric mood and sleep disturbance. Negative for agitation. The patient is nervous/anxious.   see hpi    Objective:   Physical Exam  Constitutional: She is oriented to person, place, and time. She appears well-developed and well-nourished. No distress.  HENT:  Head: Normocephalic and atraumatic.  Right Ear: Tympanic membrane normal.  Left Ear: Tympanic membrane normal.  Mouth/Throat: Posterior oropharyngeal erythema present.  Nose: Friability on the septum. Dry mucous membranes.   Eyes: Conjunctivae and EOM are normal.  Neck: Neck supple. No tracheal deviation present.  Cardiovascular: Normal rate.   Pulmonary/Chest: Effort normal. No respiratory distress. She has wheezes. She has rales.  Good air movement. Expiratory wheeze throughout. Bibasilar expiratory rales. After neb: Still with expiatory wheezing and L lower rales.   Musculoskeletal: Normal range of motion.  Neurological: She is alert and oriented to person, place, and time.  Skin: Skin is warm and dry.  Psychiatric: She has a normal mood and affect. Her behavior is normal.  Nursing note and vitals reviewed.  BP (!) 152/78   Pulse 88   Temp 99.2 F (37.3 C) (Oral)   Resp 18   Ht 5\' 4"  (1.626 m)   Wt  110 lb (49.9 kg)   SpO2 93%   BMI 18.88 kg/m     Assessment & Plan:   1. COPD exacerbation ([place])   2. Panlobular emphysema ([place])      Meds ordered this encounter  Medications  . ipratropium-albuterol (DUONEB) 0.5-2.5 (3) MG/3ML SOLN    Sig: Take 3 mLs by nebulization 4 (four) times daily -  before meals and at bedtime.    Dispense:  360 mL    Refill:  3  . albuterol (PROVENTIL) (2.5 MG/3ML) 0.083% nebulizer solution 2.5 mg  . ipratropium (ATROVENT) nebulizer solution 0.5 mg    I personally performed the services described in this documentation, which was scribed in my presence. The recorded information has been reviewed and considered, and addended by me as needed.   [name], M.D.  [facility] [address] [place], [place] [address] [phone] phone [phone] fax  07/21/16 4:15 AM

## 2016-06-29 NOTE — Patient Instructions (Signed)
     IF you received an x-ray today, you will receive an invoice from [facility]. Please contact  Radiology at [phone] with questions or concerns regarding your invoice.   IF you received labwork today, you will receive an invoice from [facility]. Please contact [facility] at [phone] with questions or concerns regarding your invoice.   Our billing staff will not be able to assist you with questions regarding bills from these companies.  You will be contacted with the lab results as soon as they are available. The fastest way to get your results is to activate your My Chart account. Instructions are located on the last page of this paperwork. If you have not heard from us regarding the results in 2 weeks, please contact this office.     

## 2016-06-30 DIAGNOSIS — J449 Chronic obstructive pulmonary disease, unspecified: Secondary | ICD-10-CM | POA: Diagnosis not present

## 2016-07-04 ENCOUNTER — Encounter: Payer: Self-pay | Admitting: Family Medicine

## 2016-07-04 ENCOUNTER — Ambulatory Visit (INDEPENDENT_AMBULATORY_CARE_PROVIDER_SITE_OTHER): Payer: Medicare Other | Admitting: Family Medicine

## 2016-07-04 VITALS — BP 136/78 | HR 90 | Temp 99.5°F | Resp 18 | Ht 64.0 in | Wt 110.0 lb

## 2016-07-04 DIAGNOSIS — R509 Fever, unspecified: Secondary | ICD-10-CM | POA: Diagnosis not present

## 2016-07-04 DIAGNOSIS — J441 Chronic obstructive pulmonary disease with (acute) exacerbation: Secondary | ICD-10-CM | POA: Diagnosis not present

## 2016-07-04 LAB — POCT CBC
Granulocyte percent: 77.4 %G (ref 37–80)
HEMATOCRIT: 35.1 % — AB (ref 37.7–47.9)
Hemoglobin: 12 g/dL — AB (ref 12.2–16.2)
Lymph, poc: 2.5 (ref 0.6–3.4)
MCH: 30.6 pg (ref 27–31.2)
MCHC: 34.3 g/dL (ref 31.8–35.4)
MCV: 89.3 fL (ref 80–97)
MID (CBC): 0.8 (ref 0–0.9)
MPV: 6.7 fL (ref 0–99.8)
POC GRANULOCYTE: 11.4 — AB (ref 2–6.9)
POC LYMPH PERCENT: 17 %L (ref 10–50)
POC MID %: 5.6 % (ref 0–12)
Platelet Count, POC: 315 10*3/uL (ref 142–424)
RBC: 3.92 M/uL — AB (ref 4.04–5.48)
RDW, POC: 14.8 %
WBC: 14.7 10*3/uL — AB (ref 4.6–10.2)

## 2016-07-04 LAB — POCT SEDIMENTATION RATE: POCT SED RATE: 16 mm/hr (ref 0–22)

## 2016-07-04 MED ORDER — METHYLPREDNISOLONE SODIUM SUCC 125 MG IJ SOLR
125.0000 mg | Freq: Once | INTRAMUSCULAR | Status: AC
  Administered 2016-07-04: 125 mg via INTRAMUSCULAR

## 2016-07-04 MED ORDER — HYDROCODONE-ACETAMINOPHEN 10-325 MG PO TABS: 1.0000 | ORAL_TABLET | Freq: Four times a day (QID) | ORAL | 0 refills | Status: DC | PRN

## 2016-07-04 MED ORDER — AZITHROMYCIN 500 MG PO TABS: 500.0000 mg | ORAL_TABLET | Freq: Every day | ORAL | 0 refills | Status: DC

## 2016-07-04 MED ORDER — IPRATROPIUM BROMIDE 0.02 % IN SOLN
0.5000 mg | Freq: Once | RESPIRATORY_TRACT | Status: AC
  Administered 2016-07-04: 0.5 mg via RESPIRATORY_TRACT

## 2016-07-04 MED ORDER — PROMETHAZINE-CODEINE 6.25-10 MG/5ML PO SYRP: 5.0000 mL | ORAL_SOLUTION | ORAL | 0 refills | Status: DC | PRN

## 2016-07-04 MED ORDER — ALBUTEROL SULFATE (2.5 MG/3ML) 0.083% IN NEBU
2.5000 mg | INHALATION_SOLUTION | Freq: Once | RESPIRATORY_TRACT | Status: AC
  Administered 2016-07-04: 2.5 mg via RESPIRATORY_TRACT

## 2016-07-04 MED ORDER — PREDNISONE 50 MG PO TABS: 50.0000 mg | ORAL_TABLET | Freq: Every day | ORAL | 0 refills | Status: AC

## 2016-07-04 MED ORDER — ALPRAZOLAM 1 MG PO TABS: ORAL_TABLET | ORAL | 0 refills | Status: DC

## 2016-07-04 NOTE — Progress Notes (Signed)
Subjective:    Patient ID: [name], female    DOB: Oct 25, 1950, 66 y.o.   MRN: [id number] Chief Complaint  Patient presents with  . Cough  . Nasal Congestion    HPI  Her mother is just died. Not sleeping at all cough Chest tight No inhalers.  Is using her oxygen at home, wearing at home but does not carry out with her - she thinks it is at 5L Has been ill for > 2 wks but yesterday she severely worsened.  I had asked her to take prednisone 20mg  a day for 3 days after our last visit 5 days prior and she still has   Has tried to change to smoking an electric cigarette.   Depression screen [facility] 2/9 07/08/2016 07/04/2016 06/29/2016 06/11/2016 05/28/2016  Decreased Interest 0 0 0 0 0  Down, Depressed, Hopeless 0 0 0 0 0  PHQ - 2 Score 0 0 0 0 0  Altered sleeping - - - - -  Tired, decreased energy - - - - -  Change in appetite - - - - -  Feeling bad or failure about yourself  - - - - -  Trouble concentrating - - - - -  Moving slowly or fidgety/restless - - - - -  Suicidal thoughts - - - - -  PHQ-9 Score - - - - -  Difficult doing work/chores - - - - -  Some recent data might be hidden   Past Medical History:  Diagnosis Date  . ACUTE DUODEN ULCER W/HEMORR PERF&OBSTRUCTION 06/26/2004   Qualifier: Diagnosis of  By: [name] MD, [name]   . Acute duodenal ulcer with hemorrhage and perforation, with obstruction ([place])   . Allergy   . Anemia   . Anginal pain ([place])    "many many years ago"  . Anxiety    sees [name] NP at Dr. Radonna Ricker office  . Arthritis    "hips" (09/06/2014)  . Asthma   . Barrett's esophagus   . Chronic abdominal pain    narcotic dependence, [facility]eng-dak [facility]  . 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 Complication of anesthesia    pt states had too much xanax on board - agitated   . COPD (chronic obstructive pulmonary disease) ([place])   . Depression   . Exertional shortness of breath   . FEVER, RECURRENT  08/11/2009   Qualifier: Diagnosis of  By: [name] MD, [name]   . Fx of fibula 07-28-11   left fibula, 2 places   . Fx two ribs-open 08-24-11   left 4th and 5th  . GASTRIC OUTLET OBSTRUCTION 08/28/2007   In July 2008 she underwent laparoscopic enterolysis, Nissen fundoplication over a 99991111 bougie, single pledgeted suture colosure of the hiatus and a 3 suture wrap.  Lap truncal vagotomy and a loop gastrojejunostomy was performed.  This was revised in August of 2009 to a roux en Y gastrojejujostomy.     . Gastroparesis 06/03/2007   Qualifier: Diagnosis of  By: [name] MD, [name]   . GERD (gastroesophageal reflux disease)   . Headache    "monthly" (09/06/2014)  . History of blood transfusion    "related to OR; maybe when I perforated that ulcer"  . History of hiatal hernia   . Lap Nissen + truncal vagotomy July 2008 05/13/2013  . LEG EDEMA 01/19/2008   Qualifier: Diagnosis of  By: [name] MD, [name]   . Menopause   .  MENOPAUSE 01/07/2007   Qualifier: Diagnosis of  By: [name], CMA, [place]    . Myocardial infarction   . Narcotic abuse   . Peptic ulcer disease    dr Oletta Lamas  . Pneumonia, organism unspecified(486) 11/07/2010   Assoc with R Parapneumonic effusion 09/2010    - Tapped 10/05/10    - CxR resolved 11/07/2010    . S/P jejunostomy Feb 2016 07/26/2014   Past Surgical History:  Procedure Laterality Date  . BIOPSY THYROID  08-13-11   benign nodule, per Dr. Melida Quitter   . CATARACT EXTRACTION W/ INTRAOCULAR LENS  IMPLANT, BILATERAL Bilateral   . COLONOSCOPY    . DILATION AND CURETTAGE OF UTERUS    . ESOPHAGOGASTRODUODENOSCOPY  12-29-09   dr qadeer at [facility], several gastric ulcers  . ESOPHAGOGASTRODUODENOSCOPY N/A 09/25/2012   Procedure: ESOPHAGOGASTRODUODENOSCOPY (EGD);  Surgeon: Beryle Beams, MD;  Location: [facility];  Service: Endoscopy;  Laterality: N/A;  . FRACTURE SURGERY    . GASTROJEJUNOSTOMY  02-20-08   Roux en Y gastrojejunsotomy w 1 foot Roux limb  . GASTROSTOMY N/A 07/26/2014    Procedure: LAPRASCOPIC ASSISTED OPEN PLACEMENT OF JEJUNOSTOMY TUBE;  Surgeon: Pedro Earls, MD;  Location: [facility];  Service: General;  Laterality: N/A;  . HERNIA REPAIR     "hiatal"  . JOINT REPLACEMENT    . LAPAROSCOPIC NISSEN FUNDOPLICATION    . LAPAROSCOPIC PARTIAL GASTRECTOMY N/A 06/29/2013   Procedure: Gastrectomy and GJ Tube placement;  Surgeon: Pedro Earls, MD;  Location: [facility];  Service: General;  Laterality: N/A;  . ORIF HIP FRACTURE Bilateral    "pins in both of them"  . REPAIR OF PERFORATED ULCER    . TONSILLECTOMY     Current Outpatient Prescriptions on File Prior to Visit  Medication Sig Dispense Refill  . albuterol (PROVENTIL HFA;VENTOLIN HFA) 108 (90 Base) MCG/ACT inhaler Inhale 2 puffs into the lungs every 4 (four) hours as needed (cough, shortness of breath or wheezing.). 1 Inhaler 3  . DULoxetine (CYMBALTA) 60 MG capsule TAKE (1) CAPSULE DAILY. 30 capsule 0  . furosemide (LASIX) 20 MG tablet TAKE 1 TABLET ONCE DAILY AS NEEDED FOR EDEMA. 30 tablet 0  . hydrOXYzine (ATARAX/VISTARIL) 50 MG tablet Take 1 tablet (50 mg total) by mouth 3 (three) times daily as needed for anxiety. 30 tablet 0  . ipratropium-albuterol (DUONEB) 0.5-2.5 (3) MG/3ML SOLN Take 3 mLs by nebulization 4 (four) times daily -  before meals and at bedtime. 360 mL 3  . methocarbamol (ROBAXIN) 500 MG tablet Take 1-2 tablets (500-1,000 mg total) by mouth 4 (four) times daily as needed for muscle spasms. 60 tablet 0  . nicotine (NICODERM CQ - DOSED IN MG/24 HOURS) 21 mg/24hr patch Place 1 patch (21 mg total) onto the skin daily. 28 patch 0  . OLANZapine (ZYPREXA) 20 MG tablet Take 1 tablet (20 mg total) by mouth at bedtime. 30 tablet 2  . omeprazole (PRILOSEC) 40 MG capsule TAKE ONE CAPSULE BY MOUTH TWICE DAILY 180 capsule 3  . ondansetron (ZOFRAN ODT) 4 MG disintegrating tablet Take 1-2 tablets (4-8 mg total) by mouth every 8 (eight) hours as needed for nausea or vomiting. 180 tablet 0   No current  facility-administered medications on file prior to visit.    Allergies  Allergen Reactions  . Lithium Nausea And Vomiting  . Celebrex [Celecoxib] Other (See Comments)    History of bleeding ulcers  . Morphine Itching  . Quetiapine Other (See Comments)  tardive dyskinesia  . Varenicline Tartrate Other (See Comments)    hallucinations   Family History  Problem Relation Age of Onset  . Cancer Mother     kidney, magliant tumor on face  . Stroke Mother   . Celiac disease Father   . Cancer Father     kidney and pancreatic   Social History   Social History  . Marital status: Divorced    Spouse name: N/A  . Number of children: N/A  . Years of education: N/A   Social History Main Topics  . Smoking status: Current Every Day Smoker    Packs/day: 2.00    Years: 50.00    Types: Cigarettes  . Smokeless tobacco: Never Used  . Alcohol use No  . Drug use: No  . Sexual activity: No   Other Topics Concern  . None   Social History Narrative  . None    Review of Systems See hpi    Objective:   Physical Exam  Constitutional: She is oriented to person, place, and time. She appears well-developed and well-nourished. She appears ill. No distress.  HENT:  Head: Normocephalic and atraumatic.  Right Ear: External ear normal.  Left Ear: External ear normal.  Nose: Nose normal.  Eyes: Conjunctivae are normal. Right eye exhibits no discharge. Left eye exhibits no discharge. No scleral icterus.  Neck: Neck supple. No thyromegaly present.  Cardiovascular: Normal rate, regular rhythm and normal heart sounds.   Pulmonary/Chest: Effort normal. No accessory muscle usage. No respiratory distress. She has no decreased breath sounds. She has wheezes in the right lower field, the left upper field and the left lower field. She has rhonchi in the right lower field and the left lower field. She has no rales.  Not wearing oxygen. Talks in complete sentences and walks through clinic w/o apparent  dyspnea.  Musculoskeletal: She exhibits no edema or tenderness.  Lymphadenopathy:    She has no cervical adenopathy.  Neurological: She is alert and oriented to person, place, and time.  Skin: Skin is warm and dry. She is not diaphoretic. No erythema.  Psychiatric: She has a normal mood and affect. Her behavior is normal.    BP 136/78 (BP Location: Right Arm, Patient Position: Sitting, Cuff Size: Small)   Pulse 90   Temp 99.5 F (37.5 C) (Oral)   Resp 18   Ht 5\' 4"  (1.626 m)   Wt 110 lb (49.9 kg)   SpO2 98%   BMI 18.88 kg/m      Results for orders placed or performed in visit on 07/04/16  POCT CBC  Result Value Ref Range   WBC 14.7 (A) 4.6 - 10.2 K/uL   Lymph, poc 2.5 0.6 - 3.4   POC LYMPH PERCENT 17.0 10 - 50 %L   MID (cbc) 0.8 0 - 0.9   POC MID % 5.6 0 - 12 %M   POC Granulocyte 11.4 (A) 2 - 6.9   Granulocyte percent 77.4 37 - 80 %G   RBC 3.92 (A) 4.04 - 5.48 M/uL   Hemoglobin 12.0 (A) 12.2 - 16.2 g/dL   HCT, POC 35.1 (A) 37.7 - 47.9 %   MCV 89.3 80 - 97 fL   MCH, POC 30.6 27 - 31.2 pg   MCHC 34.3 31.8 - 35.4 g/dL   RDW, POC 14.8 %   Platelet Count, POC 315 142 - 424 K/uL   MPV 6.7 0 - 99.8 fL    Assessment & Plan:  Laraina completed course  of levaquin 500mg  qd for 1 week.  Prior to that her last antibiotic was clindamycin  And levaquin.  Augmentin last April, Azithro last Feb 1. COPD exacerbation ([place])   2. Fever, unspecified fever cause     Orders Placed This Encounter  Procedures  . Respiratory virus panel  . POCT CBC  . POCT SEDIMENTATION RATE    Meds ordered this encounter  Medications  . albuterol (PROVENTIL) (2.5 MG/3ML) 0.083% nebulizer solution 2.5 mg  . ipratropium (ATROVENT) nebulizer solution 0.5 mg  . methylPREDNISolone sodium succinate (SOLU-MEDROL) 125 mg/2 mL injection 125 mg  . azithromycin (ZITHROMAX) 500 MG tablet    Sig: Take 1 tablet (500 mg total) by mouth daily.    Dispense:  3 tablet    Refill:  0  . predniSONE (DELTASONE) 50 MG  tablet    Sig: Take 1 tablet (50 mg total) by mouth daily with breakfast.    Dispense:  4 tablet    Refill:  0  . HYDROcodone-acetaminophen (NORCO) 10-325 MG tablet    Sig: Take 1-2 tablets by mouth every 6 (six) hours as needed. This is a 1 week supply. Ok to fill Saturday 07/06/16    Dispense:  60 tablet    Refill:  0  . ALPRAZolam (XANAX) 1 MG tablet    Sig: Take 1-2 tabs po qhs prn sleep. This is a 1 week supply. May fill Saturday 07/06/16    Dispense:  14 tablet    Refill:  0  . promethazine-codeine (PHENERGAN WITH CODEINE) 6.25-10 MG/5ML syrup    Sig: Take 5 mLs by mouth every 4 (four) hours as needed for cough.    Dispense:  240 mL    Refill:  0     [name], M.D.  [facility] [address] [place], [place] [address] [phone] phone [phone] fax  07/30/16 9:32 PM

## 2016-07-04 NOTE — Patient Instructions (Addendum)
USE YOUR NEBULIZER TREATMENTS FOUR TIMES A DAY!!! TAKE THE AZITHROMYCIN ONE A DAY FOR 3 DAYS STARTING TODAY START THE PREDNISONE EVERY MORNING TOMORROW MORNING FOR 4 DAYS   IF you received an x-ray today, you will receive an invoice from [facility] Radiology. Please contact [facility] at [phone] with questions or concerns regarding your invoice.   IF you received labwork today, you will receive an invoice from [place]. Please contact [facility] at [phone] with questions or concerns regarding your invoice.   Our billing staff will not be able to assist you with questions regarding bills from these companies.  You will be contacted with the lab results as soon as they are available. The fastest way to get your results is to activate your My Chart account. Instructions are located on the last page of this paperwork. If you have not heard from [place] regarding the results in 2 weeks, please contact this office.     Community-Acquired Pneumonia, Adult Pneumonia is an infection of the lungs. There are different types of pneumonia. One type can develop while a person is in a hospital. A different type, called community-acquired pneumonia, develops in people who are not, or have not recently been, in the hospital or other health care facility. What are the causes? Pneumonia may be caused by bacteria, viruses, or funguses. Community-acquired pneumonia is often caused by Streptococcus pneumonia bacteria. These bacteria are often passed from one person to another by breathing in droplets from the cough or sneeze of an infected person. What increases the risk? The condition is more likely to develop in:  People who havechronic diseases, such as chronic obstructive pulmonary disease (COPD), asthma, congestive heart failure, cystic fibrosis, diabetes, or kidney disease.  People who haveearly-stage or late-stage HIV.  People who havesickle cell disease.  People who havehad their  spleen removed (splenectomy).  People who havepoor Human resources officer.  People who havemedical conditions that increase the risk of breathing in (aspirating) secretions their own mouth and nose.  People who havea weakened immune system (immunocompromised).  People who smoke.  People whotravel to areas where pneumonia-causing germs commonly exist.  People whoare around animal habitats or animals that have pneumonia-causing germs, including birds, bats, rabbits, cats, and farm animals. What are the signs or symptoms? Symptoms of this condition include:  Adry cough.  A wet (productive) cough.  Fever.  Sweating.  Chest pain, especially when breathing deeply or coughing.  Rapid breathing or difficulty breathing.  Shortness of breath.  Shaking chills.  Fatigue.  Muscle aches. How is this diagnosed? Your health care provider will take a medical history and perform a physical exam. You may also have other tests, including:  Imaging studies of your chest, including X-rays.  Tests to check your blood oxygen level and other blood gases.  Other tests on blood, mucus (sputum), fluid around your lungs (pleural fluid), and urine. If your pneumonia is severe, other tests may be done to identify the specific cause of your illness. How is this treated? The type of treatment that you receive depends on many factors, such as the cause of your pneumonia, the medicines you take, and other medical conditions that you have. For most adults, treatment and recovery from pneumonia may occur at home. In some cases, treatment must happen in a hospital. Treatment may include:  Antibiotic medicines, if the pneumonia was caused by bacteria.  Antiviral medicines, if the pneumonia was caused by a virus.  Medicines that are given by mouth or through an IV  tube.  Oxygen.  Respiratory therapy. Although rare, treating severe pneumonia may include:  Mechanical ventilation. This is done if you  are not breathing well on your own and you cannot maintain a safe blood oxygen level.  Thoracentesis. This procedureremoves fluid around one lung or both lungs to help you breathe better. Follow these instructions at home:  Take over-the-counter and prescription medicines only as told by your health care provider.  Only takecough medicine if you are losing sleep. Understand that cough medicine can prevent your body's natural ability to remove mucus from your lungs.  If you were prescribed an antibiotic medicine, take it as told by your health care provider. Do not stop taking the antibiotic even if you start to feel better.  Sleep in a semi-upright position at night. Try sleeping in a reclining chair, or place a few pillows under your head.  Do not use tobacco products, including cigarettes, chewing tobacco, and e-cigarettes. If you need help quitting, ask your health care provider.  Drink enough water to keep your urine clear or pale yellow. This will help to thin out mucus secretions in your lungs. How is this prevented? There are ways that you can decrease your risk of developing community-acquired pneumonia. Consider getting a pneumococcal vaccine if:  You are older than 65 years of age.  You are older than 65 years of age and are undergoing cancer treatment, have chronic lung disease, or have other medical conditions that affect your immune system. Ask your health care provider if this applies to you. There are different types and schedules of pneumococcal vaccines. Ask your health care provider which vaccination option is best for you. You may also prevent community-acquired pneumonia if you take these actions:  Get an influenza vaccine every year. Ask your health care provider which type of influenza vaccine is best for you.  Go to the dentist on a regular basis.  Wash your hands often. Use hand sanitizer if soap and water are not available. Contact a health care provider  if:  You have a fever.  You are losing sleep because you cannot control your cough with cough medicine. Get help right away if:  You have worsening shortness of breath.  You have increased chest pain.  Your sickness becomes worse, especially if you are an older adult or have a weakened immune system.  You cough up blood. This information is not intended to replace advice given to you by your health care provider. Make sure you discuss any questions you have with your health care provider. Document Released: 06/10/2005 Document Revised: 10/19/2015 Document Reviewed: 10/05/2014 Elsevier Interactive Patient Education  2017 [employer].

## 2016-07-08 ENCOUNTER — Encounter: Payer: Self-pay | Admitting: Family Medicine

## 2016-07-08 ENCOUNTER — Ambulatory Visit (INDEPENDENT_AMBULATORY_CARE_PROVIDER_SITE_OTHER): Payer: Medicare Other | Admitting: Family Medicine

## 2016-07-08 VITALS — BP 103/65 | HR 98 | Temp 98.6°F | Resp 16 | Ht 64.0 in | Wt 104.0 lb

## 2016-07-08 DIAGNOSIS — F5101 Primary insomnia: Secondary | ICD-10-CM | POA: Diagnosis not present

## 2016-07-08 DIAGNOSIS — G894 Chronic pain syndrome: Secondary | ICD-10-CM

## 2016-07-08 DIAGNOSIS — F411 Generalized anxiety disorder: Secondary | ICD-10-CM

## 2016-07-08 DIAGNOSIS — R87810 Cervical high risk human papillomavirus (HPV) DNA test positive: Secondary | ICD-10-CM

## 2016-07-08 DIAGNOSIS — Z5181 Encounter for therapeutic drug level monitoring: Secondary | ICD-10-CM | POA: Diagnosis not present

## 2016-07-08 MED ORDER — HYDROCODONE-ACETAMINOPHEN 10-325 MG PO TABS: 1.0000 | ORAL_TABLET | Freq: Four times a day (QID) | ORAL | 0 refills | Status: DC | PRN

## 2016-07-08 MED ORDER — ALPRAZOLAM 1 MG PO TABS: ORAL_TABLET | ORAL | 0 refills | Status: DC

## 2016-07-08 MED ORDER — ALPRAZOLAM 1 MG PO TABS
1.0000 mg | ORAL_TABLET | Freq: Every evening | ORAL | 0 refills | Status: DC | PRN
Start: 2016-07-08 — End: 2016-08-03

## 2016-07-08 MED ORDER — HYDROCODONE-ACETAMINOPHEN 10-325 MG PO TABS
1.0000 | ORAL_TABLET | Freq: Four times a day (QID) | ORAL | 0 refills | Status: DC | PRN
Start: 2016-07-08 — End: 2016-08-03

## 2016-07-08 MED ORDER — ALPRAZOLAM 1 MG PO TABS: 1.0000 mg | ORAL_TABLET | Freq: Every evening | ORAL | 0 refills | Status: DC | PRN

## 2016-07-08 NOTE — Patient Instructions (Addendum)
IF you received an x-ray today, you will receive an invoice from [facility] Radiology. Please contact [facility] Radiology at [phone] with questions or concerns regarding your invoice.   IF you received labwork today, you will receive an invoice from [place]. Please contact [facility] at [phone] with questions or concerns regarding your invoice.   Our billing staff will not be able to assist you with questions regarding bills from these companies.  You will be contacted with the lab results as soon as they are available. The fastest way to get your results is to activate your My Chart account. Instructions are located on the last page of this paperwork. If you have not heard from [place] regarding the results in 2 weeks, please contact this office.     This is why we can't increase your xanax - there is a huge push to make sure people are not on xanax and pain medicines - you have to choose one.  We can continue trying other things to help with anxiety and sleep but you are getting to where you have tried just about everything I know off so will keep on trying to think of other things that might help or we could try a specialist. . ..   Benzodiazepine Overdose Introduction Benzodiazepines are prescription medicines that decrease the activity of (depress) the central nervous system and cause changes in certain brain chemicals (neurotransmitters). These are the most commonly prescribed benzodiazepines:  Alprazolam.  Lorazepam.  Clonazepam.  Diazepam.  Temazepam. A benzodiazepine overdose happens when you take too much of your medicine. The effects of an overdose can be mild, dangerous, or even deadly. Benzodiazepine overdose is a medical emergency. What are the causes? This condition may be caused by:  Taking too much of a medicine by accident.  Taking too much of a medicine on purpose.  An error made by a health care provider who prescribes a medicine.  An error made by  the pharmacist who fills the prescription order. What increases the risk? This condition is more likely in:  Children. They may be attracted to colorful pills. Because of a child's small size, even a small amount of a medicine can be dangerous.  Elderly people. They may be taking many different medicines. Elderly people may have difficulty reading labels or remembering when they last took their medicine.  People who use:  Illegal drugs.  Other substances, including alcohol, while taking benzodiazepines.  People who have:  A history of drug or alcohol abuse.  Certain mental health conditions.  Breathing problems.  Liver problems. What are the signs or symptoms? Symptoms of this condition depend on the type of medicine and the amount that was taken. Symptoms may include:  Drowsiness.  Confusion.  Lack of energy.  Slurred speech.  Clumsiness.  Muscle weakness.  Dizziness.  Slow breathing.  Coma. Death from a benzodiazepine overdose is rare. Death is more likely if benzodiazepines are taken at the same time as other central nervous system depressants, such as alcohol. How is this diagnosed? This condition is diagnosed based on your symptoms. It is important to tell your health care provider:  All of the medicines that you took.  When you took the medicines.  Whether you have been drinking alcohol or using other substances. Your health care provider will do a physical exam. This exam may include:  Checking and monitoring your heart rate and rhythm, your temperature, and your blood pressure (vital signs).  Checking your breathing and oxygen level.  You may also have blood tests or urine tests. How is this treated? Supporting your vital signs and your breathing is the first step in treating a benzodiazepine overdose. Treatment may also include:  Giving fluids and minerals (electrolytes) through an IV tube.  Inserting a breathing tube (endotracheal tube) in your  airway to help you breathe.  Passing a tube through your nose and into your stomach (NG tube, or nasogastric tube) to wash out your stomach.  Giving medicines that:  Increase your blood pressure.  Reverse the effects of the benzodiazepine (flumazenil).  Absorb any benzodiazepine that is in your digestive system. This treatment is rare.  Ongoing counseling and mental health support if you intentionally overdosed or used an illegal drug. Follow these instructions at home:  Take over-the-counter and prescription medicines only as told by your health care provider. Always ask your health care provider about possible side effects and interactions of any new medicine that you start taking.  Keep a list of all of the medicines that you take, including over-the-counter medicines. Bring this list with you to all of your medical visits.  Drink enough fluid to keep your urine clear or pale yellow.  Keep all follow-up visits as told by your health care provider. This is important. How is this prevented?  Get help if you are struggling with:  Alcohol or drug use.  Depression or another mental health problem.  Keep the phone number of your local poison control center near your phone or on your cell phone.  Store all medicines in safety containers that are out of the reach of children.  Read the drug inserts that come with your medicines.  Do not drink alcohol when taking benzodiazepines.  Do not use illegal drugs.  Do not take benzodiazepines that are not prescribed for you. Contact a health care provider if:  Your symptoms return.  You develop new symptoms or side effects when you take medicines. Get help right away if:  You think that you or someone else may have taken too much of a benzodiazepine. The hotline of the [facility] is [phone].  You or someone else is having symptoms of a benzodiazepine overdose.  You have serious thoughts about hurting  yourself or others.  You have:  Difficulty breathing.  A loss of consciousness.  A seizure.  You feel dizzy all the time.  You feel weak or you faint. Benzodiazepine overdose is an emergency. Do not wait to see if the symptoms will go away. Get medical help right away. Call your local emergency services (911 in the [place] Do not drive yourself to the hospital.  This information is not intended to replace advice given to you by your health care provider. Make sure you discuss any questions you have with your health care provider. Document Released: 07/18/2004 Document Revised: 11/16/2015 Document Reviewed: 11/30/2014  2017 Elsevier

## 2016-07-08 NOTE — Progress Notes (Signed)
Subjective:    Patient ID: [name], female    DOB: 09/04/1950, 66 y.o.   MRN: [id number] Chief Complaint  Patient presents with  . Follow-up    pnuemonia/ pt states she is feeling better.    HPI  [name] is a 66 yo woman here for a 1 week follow-up on a copd exacerbation.  She is finally feeling much better. States after the solumedrol shot sev days ago she felt immed better and is now doing great.  +HR HPV, normal pap: repeat pap and HPV was due 09/2015  Vitamin D def: vit D 9 12/2014  Can't sleep.  Depression screen [facility] 2/9 08/03/2016 07/08/2016 07/04/2016 06/29/2016 06/11/2016  Decreased Interest 1 0 0 0 0  Down, Depressed, Hopeless 1 0 0 0 0  PHQ - 2 Score 2 0 0 0 0  Altered sleeping 3 - - - -  Tired, decreased energy 3 - - - -  Change in appetite 3 - - - -  Feeling bad or failure about yourself  3 - - - -  Trouble concentrating 3 - - - -  Moving slowly or fidgety/restless 0 - - - -  Suicidal thoughts 0 - - - -  PHQ-9 Score 17 - - - -  Difficult doing work/chores - - - - -  Some recent data might be hidden   Past Medical History:  Diagnosis Date  . ACUTE DUODEN ULCER W/HEMORR PERF&OBSTRUCTION 06/26/2004   Qualifier: Diagnosis of  By: [name] MD, [name]   . Acute duodenal ulcer with hemorrhage and perforation, with obstruction ([place])   . Allergy   . Anemia   . Anginal pain ([place])    "many many years ago"  . Anxiety    sees [name] NP at Dr. Radonna Ricker office  . Arthritis    "hips" (09/06/2014)  . Asthma   . Barrett's esophagus   . Chronic abdominal pain    narcotic dependence, dr gyarteng-dak at [facility]  . 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 Complication of anesthesia    pt states had too much xanax on board - agitated   . COPD (chronic obstructive pulmonary disease) ([place])   . Depression   . Exertional shortness of breath   . FEVER, RECURRENT 08/11/2009   Qualifier: Diagnosis of  By: [name] MD, [name]   . Fx of  fibula 07-28-11   left fibula, 2 places   . Fx two ribs-open 08-24-11   left 4th and 5th  . GASTRIC OUTLET OBSTRUCTION 08/28/2007   In July 2008 she underwent laparoscopic enterolysis, Nissen fundoplication over a [id number] bougie, single pledgeted suture colosure of the hiatus and a 3 suture wrap.  Lap truncal vagotomy and a loop gastrojejunostomy was performed.  This was revised in August of 2009 to a roux en Y gastrojejujostomy.     . Gastroparesis 06/03/2007   Qualifier: Diagnosis of  By: [name] MD, [name]   . GERD (gastroesophageal reflux disease)   . Headache    "monthly" (09/06/2014)  . History of blood transfusion    "related to OR; maybe when I perforated that ulcer"  . History of hiatal hernia   . Lap Nissen + truncal vagotomy July 2008 05/13/2013  . LEG EDEMA 01/19/2008   Qualifier: Diagnosis of  By: [name] MD, [name]   . Menopause   . MENOPAUSE 01/07/2007   Qualifier: Diagnosis of  By: [name], CMA, [place]    .  Myocardial infarction   . Narcotic abuse   . Peptic ulcer disease    dr Oletta Lamas  . Pneumonia, organism unspecified(486) 11/07/2010   Assoc with R Parapneumonic effusion 09/2010    - Tapped 10/05/10    - CxR resolved 11/07/2010    . S/P jejunostomy Feb 2016 07/26/2014   Past Surgical History:  Procedure Laterality Date  . BIOPSY THYROID  08-13-11   benign nodule, per Dr. Melida Quitter   . CATARACT EXTRACTION W/ INTRAOCULAR LENS  IMPLANT, BILATERAL Bilateral   . COLONOSCOPY    . DILATION AND CURETTAGE OF UTERUS    . ESOPHAGOGASTRODUODENOSCOPY  12-29-09   dr qadeer at [facility], several gastric ulcers  . ESOPHAGOGASTRODUODENOSCOPY N/A 09/25/2012   Procedure: ESOPHAGOGASTRODUODENOSCOPY (EGD);  Surgeon: Beryle Beams, MD;  Location: [facility];  Service: Endoscopy;  Laterality: N/A;  . FRACTURE SURGERY    . GASTROJEJUNOSTOMY  02-20-08   Roux en Y gastrojejunsotomy w 1 foot Roux limb  . GASTROSTOMY N/A 07/26/2014   Procedure: LAPRASCOPIC ASSISTED OPEN PLACEMENT OF JEJUNOSTOMY TUBE;  Surgeon:  Pedro Earls, MD;  Location: [facility];  Service: General;  Laterality: N/A;  . HERNIA REPAIR     "hiatal"  . JOINT REPLACEMENT    . LAPAROSCOPIC NISSEN FUNDOPLICATION    . LAPAROSCOPIC PARTIAL GASTRECTOMY N/A 06/29/2013   Procedure: Gastrectomy and GJ Tube placement;  Surgeon: Pedro Earls, MD;  Location: [facility];  Service: General;  Laterality: N/A;  . ORIF HIP FRACTURE Bilateral    "pins in both of them"  . REPAIR OF PERFORATED ULCER    . TONSILLECTOMY     Current Outpatient Prescriptions on File Prior to Visit  Medication Sig Dispense Refill  . albuterol (PROVENTIL HFA;VENTOLIN HFA) 108 (90 Base) MCG/ACT inhaler Inhale 2 puffs into the lungs every 4 (four) hours as needed (cough, shortness of breath or wheezing.). 1 Inhaler 3  . DULoxetine (CYMBALTA) 60 MG capsule TAKE (1) CAPSULE DAILY. 30 capsule 0  . furosemide (LASIX) 20 MG tablet TAKE 1 TABLET ONCE DAILY AS NEEDED FOR EDEMA. 30 tablet 0  . ipratropium-albuterol (DUONEB) 0.5-2.5 (3) MG/3ML SOLN Take 3 mLs by nebulization 4 (four) times daily -  before meals and at bedtime. 360 mL 3  . methocarbamol (ROBAXIN) 500 MG tablet Take 1-2 tablets (500-1,000 mg total) by mouth 4 (four) times daily as needed for muscle spasms. (Patient not taking: Reported on 08/03/2016) 60 tablet 0  . nicotine (NICODERM CQ - DOSED IN MG/24 HOURS) 21 mg/24hr patch Place 1 patch (21 mg total) onto the skin daily. (Patient not taking: Reported on 08/03/2016) 28 patch 0  . OLANZapine (ZYPREXA) 20 MG tablet Take 1 tablet (20 mg total) by mouth at bedtime. 30 tablet 2  . omeprazole (PRILOSEC) 40 MG capsule TAKE ONE CAPSULE BY MOUTH TWICE DAILY 180 capsule 3   No current facility-administered medications on file prior to visit.    Allergies  Allergen Reactions  . Lithium Nausea And Vomiting  . Celebrex [Celecoxib] Other (See Comments)    History of bleeding ulcers  . Morphine Itching  . Quetiapine Other (See Comments)     tardive dyskinesia  . Varenicline  Tartrate Other (See Comments)    hallucinations   Family History  Problem Relation Age of Onset  . Cancer Mother     kidney, magliant tumor on face  . Stroke Mother   . Celiac disease Father   . Cancer Father     kidney and pancreatic  Social History   Social History  . Marital status: Divorced    Spouse name: N/A  . Number of children: N/A  . Years of education: N/A   Social History Main Topics  . Smoking status: Current Every Day Smoker    Packs/day: 2.00    Years: 50.00    Types: Cigarettes  . Smokeless tobacco: Never Used  . Alcohol use No  . Drug use: No  . Sexual activity: No   Other Topics Concern  . None   Social History Narrative  . None    Review of Systems See hpi    Objective:   Physical Exam  Constitutional: She is oriented to person, place, and time. She appears well-developed and well-nourished. No distress.  HENT:  Head: Normocephalic and atraumatic.  Right Ear: External ear normal.  Left Ear: External ear normal.  Eyes: Conjunctivae are normal. No scleral icterus.  Neck: Normal range of motion. Neck supple. No thyromegaly present.  Cardiovascular: Normal rate, regular rhythm, normal heart sounds and intact distal pulses.   Pulmonary/Chest: Effort normal and breath sounds normal. No respiratory distress.  Musculoskeletal: She exhibits no edema.  Lymphadenopathy:    She has no cervical adenopathy.  Neurological: She is alert and oriented to person, place, and time.  Skin: Skin is warm and dry. She is not diaphoretic. No erythema.  Psychiatric: She has a normal mood and affect. Her behavior is normal.         BP 103/65   Pulse 98   Temp 98.6 F (37 C) (Oral)   Resp 16   Ht 5\' 4"  (1.626 m)   Wt 104 lb (47.2 kg)   SpO2 98%   BMI 17.85 kg/m   Assessment & Plan:  sched CPE next avail- consider need for just type 16 or 18 testing for HPV  1. Chronic pain syndrome   2. Primary insomnia   3. Generalized anxiety disorder   4.  Medication monitoring encounter   5. Cervical high risk HPV (human papillomavirus) test positive      Meds ordered this encounter  Medications  . DISCONTD: ALPRAZolam (XANAX) 1 MG tablet    Sig: Take 1-2 tabs po qhs prn sleep. This is a 1 week supply. May fill Saturday 07/13/16    Dispense:  14 tablet    Refill:  0  . DISCONTD: HYDROcodone-acetaminophen (NORCO) 10-325 MG tablet    Sig: Take 1-2 tablets by mouth every 6 (six) hours as needed. This is a 1 week supply. Ok to fill Saturday 07/13/16    Dispense:  60 tablet    Refill:  0  . DISCONTD: ALPRAZolam (XANAX) 1 MG tablet    Sig: Take 1-2 tablets (1-2 mg total) by mouth at bedtime as needed for sleep.    Dispense:  14 tablet    Refill:  0    May fill on or after Saturday July 20, 2016  . DISCONTD: ALPRAZolam (XANAX) 1 MG tablet    Sig: Take 1 tablet (1 mg total) by mouth at bedtime as needed for sleep.    Dispense:  14 tablet    Refill:  0    May fill on or after Saturday February 3rd, 2018  . DISCONTD: ALPRAZolam (XANAX) 1 MG tablet    Sig: Take 1 tablet (1 mg total) by mouth at bedtime as needed for anxiety or sleep.    Dispense:  14 tablet    Refill:  0    May fill on or after  Saturday August 03, 2016  . DISCONTD: HYDROcodone-acetaminophen (NORCO) 10-325 MG tablet    Sig: Take 1-2 tablets by mouth every 6 (six) hours as needed.    Dispense:  60 tablet    Refill:  0    May fill on or after saturday 07/20/2016  . DISCONTD: HYDROcodone-acetaminophen (NORCO) 10-325 MG tablet    Sig: Take 1-2 tablets by mouth every 6 (six) hours as needed.    Dispense:  60 tablet    Refill:  0    May fill on or after Saturday 07/27/2016  . DISCONTD: HYDROcodone-acetaminophen (NORCO) 10-325 MG tablet    Sig: Take 1-2 tablets by mouth every 6 (six) hours as needed.    Dispense:  60 tablet    Refill:  0    May fill on or after Saturday 08/04/2015     [name], M.D.  Primary Care at [facility] [address] [place],  [place] [address] [phone] phone [phone] fax  08/04/16 1:18 AM

## 2016-07-13 ENCOUNTER — Other Ambulatory Visit: Payer: Self-pay | Admitting: Family Medicine

## 2016-07-13 NOTE — Telephone Encounter (Signed)
05/04/17 last seen

## 2016-07-15 ENCOUNTER — Encounter: Payer: Medicare Other | Admitting: Family Medicine

## 2016-07-15 ENCOUNTER — Other Ambulatory Visit: Payer: Self-pay | Admitting: Family Medicine

## 2016-07-21 DIAGNOSIS — J449 Chronic obstructive pulmonary disease, unspecified: Secondary | ICD-10-CM | POA: Diagnosis not present

## 2016-07-29 ENCOUNTER — Encounter: Payer: Medicare Other | Admitting: Family Medicine

## 2016-07-29 ENCOUNTER — Telehealth: Payer: Self-pay

## 2016-07-29 NOTE — Telephone Encounter (Signed)
Pt needs a prior authorization for an oxygen tank that she is trying to order over the phone   Please advise [phone]

## 2016-07-30 NOTE — Telephone Encounter (Signed)
lmtcb with what we need to do? Rx? Call? Diagnosis?

## 2016-07-31 NOTE — Telephone Encounter (Signed)
THIS MESSAGE IS FROM [employer] FOR DR. Harmon Pier SHAW: COMPANY STATES THEY FAXED OVER A REQUEST FOR THIS PATIENT TO HAVE A PORTABLE OXYGEN CONCENTRATOR ON Monday 07/29/16. THEY WOULD LIKE TO KNOW IF THE REQUEST HAS BEEN FAXED BACK YET? [employer] [phone] - PLEASE ASK FOR AARON. [place]

## 2016-08-01 ENCOUNTER — Ambulatory Visit: Payer: Medicare Other | Admitting: Family Medicine

## 2016-08-01 NOTE — Telephone Encounter (Signed)
Form in med refill stack.  Placed in Agency box today

## 2016-08-03 ENCOUNTER — Ambulatory Visit (INDEPENDENT_AMBULATORY_CARE_PROVIDER_SITE_OTHER): Payer: Medicare Other | Admitting: Family Medicine

## 2016-08-03 VITALS — BP 112/74 | HR 88 | Temp 97.6°F | Resp 18 | Ht 64.0 in | Wt 106.8 lb

## 2016-08-03 DIAGNOSIS — B977 Papillomavirus as the cause of diseases classified elsewhere: Secondary | ICD-10-CM

## 2016-08-03 DIAGNOSIS — Z5181 Encounter for therapeutic drug level monitoring: Secondary | ICD-10-CM | POA: Diagnosis not present

## 2016-08-03 DIAGNOSIS — R05 Cough: Secondary | ICD-10-CM

## 2016-08-03 DIAGNOSIS — R059 Cough, unspecified: Secondary | ICD-10-CM

## 2016-08-03 LAB — POCT CBC
GRANULOCYTE PERCENT: 70.8 % (ref 37–80)
HEMATOCRIT: 38.2 % (ref 37.7–47.9)
Hemoglobin: 13.3 g/dL (ref 12.2–16.2)
Lymph, poc: 1.6 (ref 0.6–3.4)
MCH, POC: 31.4 pg — AB (ref 27–31.2)
MCHC: 34.9 g/dL (ref 31.8–35.4)
MCV: 89.8 fL (ref 80–97)
MID (CBC): 0.6 (ref 0–0.9)
MPV: 7.6 fL (ref 0–99.8)
POC GRANULOCYTE: 5.5 (ref 2–6.9)
POC LYMPH %: 21.3 % (ref 10–50)
POC MID %: 7.9 % (ref 0–12)
Platelet Count, POC: 276 10*3/uL (ref 142–424)
RBC: 4.25 M/uL (ref 4.04–5.48)
RDW, POC: 14.9 %
WBC: 7.7 10*3/uL (ref 4.6–10.2)

## 2016-08-03 LAB — POCT SEDIMENTATION RATE: POCT SED RATE: 9 mm/hr (ref 0–22)

## 2016-08-03 LAB — POCT INFLUENZA A/B
INFLUENZA A, POC: NEGATIVE
INFLUENZA B, POC: NEGATIVE

## 2016-08-03 MED ORDER — ONDANSETRON 4 MG PO TBDP: 4.0000 mg | ORAL_TABLET | Freq: Three times a day (TID) | ORAL | 0 refills | Status: DC | PRN

## 2016-08-03 MED ORDER — HYDROCODONE-ACETAMINOPHEN 10-325 MG PO TABS: 1.0000 | ORAL_TABLET | Freq: Four times a day (QID) | ORAL | 0 refills | Status: DC | PRN

## 2016-08-03 MED ORDER — ALPRAZOLAM 1 MG PO TABS: ORAL_TABLET | ORAL | 0 refills | Status: DC

## 2016-08-03 MED ORDER — IPRATROPIUM BROMIDE 0.03 % NA SOLN: 2.0000 | Freq: Four times a day (QID) | NASAL | 1 refills | Status: DC

## 2016-08-03 MED ORDER — ALPRAZOLAM 1 MG PO TABS: 1.0000 mg | ORAL_TABLET | Freq: Every evening | ORAL | 0 refills | Status: DC | PRN

## 2016-08-03 MED ORDER — CODEINE POLT-CHLORPHEN POLT ER 14.7-2.8 MG/5ML PO SUER: 5.0000 mL | Freq: Two times a day (BID) | ORAL | 0 refills | Status: DC | PRN

## 2016-08-03 MED ORDER — HYDROXYZINE HCL 50 MG PO TABS: 100.0000 mg | ORAL_TABLET | Freq: Every day | ORAL | 0 refills | Status: DC

## 2016-08-03 NOTE — Progress Notes (Signed)
Subjective:  By signing my name below, I, [name], attest that this documentation has been prepared under the direction and in the presence of [name], MD Electronically Signed: Ladene Artist, ED Scribe 08/03/2016 at 1:05 PM.   Patient ID: [name], female    DOB: 11/02/1950, 66 y.o.   MRN: [id number]  Chief Complaint  Patient presents with  . Cough    X 1 week  . Nasal Congestion    X 1 week  . Chills    X 1 week  . Medication Refill    Wants zofran and phenergan   HPI [name] is a 66 y.o. female who presents to Primary Care at [facility] complaining of cough for the past week. Pt reports associated symptoms of nasal congestion, chills, tremors, nausea, weakness for the past week as well. She states that she always experiences similar symptoms when she runs out of hydrocodone for a few days. She took 2 home nebs this morning and has been using her O2. Pt denies emesis.   Preventative Maintenance   Pt agrees to have her pap done today. She reports that her mother had a h/o cervical CA.  Past Medical History:  Diagnosis Date  . ACUTE DUODEN ULCER W/HEMORR PERF&OBSTRUCTION 06/26/2004   Qualifier: Diagnosis of  By: [name] MD, [name]   . Acute duodenal ulcer with hemorrhage and perforation, with obstruction ([place])   . Allergy   . Anemia   . Anginal pain ([place])    "many many years ago"  . Anxiety    sees [name] NP at Dr. Radonna Ricker office  . Arthritis    "hips" (09/06/2014)  . Asthma   . Barrett's esophagus   . Chronic abdominal pain    narcotic dependence, dr gyarteng-dak at [facility]  . 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 Complication of anesthesia    pt states had too much xanax on board - agitated   . COPD (chronic obstructive pulmonary disease) ([place])   . Depression   . Exertional shortness of breath   . FEVER, RECURRENT 08/11/2009   Qualifier: Diagnosis of  By: [name] MD, [name]   . Fx of fibula 07-28-11   left  fibula, 2 places   . Fx two ribs-open 08-24-11   left 4th and 5th  . GASTRIC OUTLET OBSTRUCTION 08/28/2007   In July 2008 she underwent laparoscopic enterolysis, Nissen fundoplication over a [id number] bougie, single pledgeted suture colosure of the hiatus and a 3 suture wrap.  Lap truncal vagotomy and a loop gastrojejunostomy was performed.  This was revised in August of 2009 to a roux en Y gastrojejujostomy.     . Gastroparesis 06/03/2007   Qualifier: Diagnosis of  By: [name] MD, [name]   . GERD (gastroesophageal reflux disease)   . Headache    "monthly" (09/06/2014)  . History of blood transfusion    "related to OR; maybe when I perforated that ulcer"  . History of hiatal hernia   . Lap Nissen + truncal vagotomy July 2008 05/13/2013  . LEG EDEMA 01/19/2008   Qualifier: Diagnosis of  By: [name] MD, [name]   . Menopause   . MENOPAUSE 01/07/2007   Qualifier: Diagnosis of  By: [name], CMA, [place]    . Myocardial infarction   . Narcotic abuse   . Peptic ulcer disease    dr Oletta Lamas  . Pneumonia, organism unspecified(486) 11/07/2010   Assoc with R Parapneumonic effusion 09/2010    -  Tapped 10/05/10    - CxR resolved 11/07/2010    . S/P jejunostomy Feb 2016 07/26/2014    Current Outpatient Prescriptions on File Prior to Visit  Medication Sig Dispense Refill  . albuterol (PROVENTIL HFA;VENTOLIN HFA) 108 (90 Base) MCG/ACT inhaler Inhale 2 puffs into the lungs every 4 (four) hours as needed (cough, shortness of breath or wheezing.). 1 Inhaler 3  . ALPRAZolam (XANAX) 1 MG tablet Take 1-2 tabs po qhs prn sleep. This is a 1 week supply. May fill Saturday 07/13/16 14 tablet 0  . ALPRAZolam (XANAX) 1 MG tablet Take 1-2 tablets (1-2 mg total) by mouth at bedtime as needed for sleep. 14 tablet 0  . ALPRAZolam (XANAX) 1 MG tablet Take 1 tablet (1 mg total) by mouth at bedtime as needed for sleep. 14 tablet 0  . ALPRAZolam (XANAX) 1 MG tablet Take 1 tablet (1 mg total) by mouth at bedtime as needed for anxiety or sleep.  14 tablet 0  . DULoxetine (CYMBALTA) 60 MG capsule TAKE (1) CAPSULE DAILY. 30 capsule 0  . furosemide (LASIX) 20 MG tablet TAKE 1 TABLET ONCE DAILY AS NEEDED FOR EDEMA. 30 tablet 0  . HYDROcodone-acetaminophen (NORCO) 10-325 MG tablet Take 1-2 tablets by mouth every 6 (six) hours as needed. This is a 1 week supply. Ok to fill Saturday 07/13/16 60 tablet 0  . HYDROcodone-acetaminophen (NORCO) 10-325 MG tablet Take 1-2 tablets by mouth every 6 (six) hours as needed. 60 tablet 0  . HYDROcodone-acetaminophen (NORCO) 10-325 MG tablet Take 1-2 tablets by mouth every 6 (six) hours as needed. 60 tablet 0  . HYDROcodone-acetaminophen (NORCO) 10-325 MG tablet Take 1-2 tablets by mouth every 6 (six) hours as needed. 60 tablet 0  . ipratropium-albuterol (DUONEB) 0.5-2.5 (3) MG/3ML SOLN Take 3 mLs by nebulization 4 (four) times daily -  before meals and at bedtime. 360 mL 3  . OLANZapine (ZYPREXA) 20 MG tablet Take 1 tablet (20 mg total) by mouth at bedtime. 30 tablet 2  . omeprazole (PRILOSEC) 40 MG capsule TAKE ONE CAPSULE BY MOUTH TWICE DAILY 180 capsule 3  . ondansetron (ZOFRAN ODT) 4 MG disintegrating tablet Take 1-2 tablets (4-8 mg total) by mouth every 8 (eight) hours as needed for nausea or vomiting. 180 tablet 0  . azithromycin (ZITHROMAX) 500 MG tablet Take 1 tablet (500 mg total) by mouth daily. (Patient not taking: Reported on 07/08/2016) 3 tablet 0  . hydrOXYzine (ATARAX/VISTARIL) 50 MG tablet Take 1 tablet (50 mg total) by mouth 3 (three) times daily as needed for anxiety. (Patient not taking: Reported on 08/03/2016) 30 tablet 0  . methocarbamol (ROBAXIN) 500 MG tablet Take 1-2 tablets (500-1,000 mg total) by mouth 4 (four) times daily as needed for muscle spasms. (Patient not taking: Reported on 08/03/2016) 60 tablet 0  . nicotine (NICODERM CQ - DOSED IN MG/24 HOURS) 21 mg/24hr patch Place 1 patch (21 mg total) onto the skin daily. (Patient not taking: Reported on 08/03/2016) 28 patch 0  .  promethazine-codeine (PHENERGAN WITH CODEINE) 6.25-10 MG/5ML syrup Take 5 mLs by mouth every 4 (four) hours as needed for cough. (Patient not taking: Reported on 08/03/2016) 240 mL 0   No current facility-administered medications on file prior to visit.    Allergies  Allergen Reactions  . Lithium Nausea And Vomiting  . Celebrex [Celecoxib] Other (See Comments)    History of bleeding ulcers  . Morphine Itching  . Quetiapine Other (See Comments)     tardive dyskinesia  .  Varenicline Tartrate Other (See Comments)    hallucinations   Depression screen [facility] 2/9 08/13/2016 08/03/2016 07/08/2016 07/04/2016 06/29/2016  Decreased Interest 0 1 0 0 0  Down, Depressed, Hopeless 0 1 0 0 0  PHQ - 2 Score 0 2 0 0 0  Altered sleeping - 3 - - -  Tired, decreased energy - 3 - - -  Change in appetite - 3 - - -  Feeling bad or failure about yourself  - 3 - - -  Trouble concentrating - 3 - - -  Moving slowly or fidgety/restless - 0 - - -  Suicidal thoughts - 0 - - -  PHQ-9 Score - 17 - - -  Difficult doing work/chores - - - - -  Some recent data might be hidden    Review of Systems  Constitutional: Positive for chills.  HENT: Positive for congestion.   Respiratory: Positive for cough.   Gastrointestinal: Positive for nausea. Negative for vomiting.  Neurological: Positive for tremors and weakness.      Objective:   Physical Exam  Constitutional: She is oriented to person, place, and time. She appears well-developed and well-nourished. No distress.  HENT:  Head: Normocephalic and atraumatic.  Eyes: Conjunctivae and EOM are normal.  Neck: Neck supple. No tracheal deviation present.  Cardiovascular: Normal rate, regular rhythm, S1 normal, S2 normal and normal heart sounds.   Pulmonary/Chest: Effort normal. No respiratory distress. She has rales (few inspiratory rales in the L lower lobe that clear with deep breathing).  Good air movement.  Genitourinary: There is no rash, tenderness or lesion on the  right labia. There is no rash, tenderness or lesion on the left labia. Uterus is not deviated, not enlarged, not fixed and not tender. Cervix exhibits no motion tenderness. Right adnexum displays no mass, no tenderness and no fullness. Left adnexum displays no mass, no tenderness and no fullness. There is tenderness in the vagina. No erythema or bleeding in the vagina. No signs of injury around the vagina. No vaginal discharge found.  Genitourinary Comments: Pt unable to tolerate even small speculum exam to visualize cervix so pap collected manually w/o visualization.  Musculoskeletal: Normal range of motion.  Lymphadenopathy:    She has no cervical adenopathy.  Neurological: She is alert and oriented to person, place, and time.  Skin: Skin is warm and dry.  Psychiatric: She has a normal mood and affect. Her behavior is normal.  Nursing note and vitals reviewed.  BP 112/74   Pulse 88   Temp 97.6 F (36.4 C) (Oral)   Resp 18   Ht 5\' 4"  (1.626 m)   Wt 106 lb 12.8 oz (48.4 kg)   SpO2 97%   BMI 18.33 kg/m     Results for orders placed or performed in visit on 08/03/16  POCT Influenza A/B  Result Value Ref Range   Influenza A, POC Negative Negative   Influenza B, POC Negative Negative  POCT CBC  Result Value Ref Range   WBC 7.7 4.6 - 10.2 K/uL   Lymph, poc 1.6 0.6 - 3.4   POC LYMPH PERCENT 21.3 10 - 50 %L   MID (cbc) 0.6 0 - 0.9   POC MID % 7.9 0 - 12 %M   POC Granulocyte 5.5 2 - 6.9   Granulocyte percent 70.8 37 - 80 %G   RBC 4.25 4.04 - 5.48 M/uL   Hemoglobin 13.3 12.2 - 16.2 g/dL   HCT, POC 38.2 37.7 - 47.9 %  MCV 89.8 80 - 97 fL   MCH, POC 31.4 (A) 27 - 31.2 pg   MCHC 34.9 31.8 - 35.4 g/dL   RDW, POC 14.9 %   Platelet Count, POC 276 142 - 424 K/uL   MPV 7.6 0 - 99.8 fL   Assessment & Plan:  Last thyroid checked 06/04/15. No prior lipid panel. Pap smear was 09/27/14 with positive for high risk HPV. Pap normal and negative for types 16 or 18. recommendation was to repeat in  1 year which would've been April 2017. She was ill with COPD exacerbation 1 month prior which had resulted in 2 ER visits, several courses of prednisone and antibiotics Levaquin 500 qd and Azithromycin, started 12/25-1/15. She has O2 at home. She was using home O2 at 5 L, has portal O2 but doesn't use it, doesn't use home inhalers or neb. She is still smoking though has been trying to switch to electronic cigarettes.  1. Cough   2. Medication monitoring encounter   3. High risk HPV infection     Orders Placed This Encounter  Procedures  . Comprehensive metabolic panel  . POCT Influenza A/B  . POCT CBC  . POCT SEDIMENTATION RATE    Meds ordered this encounter  Medications  . HYDROcodone-acetaminophen (NORCO) 10-325 MG tablet    Sig: Take 1-2 tablets by mouth every 6 (six) hours as needed. This is a 1 week supply. Ok to fill Saturday 08/10/16    Dispense:  60 tablet    Refill:  0  . HYDROcodone-acetaminophen (NORCO) 10-325 MG tablet    Sig: Take 1-2 tablets by mouth every 6 (six) hours as needed.    Dispense:  60 tablet    Refill:  0    May fill on or after saturday 08/17/2016  . HYDROcodone-acetaminophen (NORCO) 10-325 MG tablet    Sig: Take 1-2 tablets by mouth every 6 (six) hours as needed.    Dispense:  60 tablet    Refill:  0    May fill on or after Saturday 08/24/2016  . HYDROcodone-acetaminophen (NORCO) 10-325 MG tablet    Sig: Take 1-2 tablets by mouth every 6 (six) hours as needed.    Dispense:  60 tablet    Refill:  0    May fill on or after Saturday 09/01/2015  . ALPRAZolam (XANAX) 1 MG tablet    Sig: Take 1-2 tablets (1-2 mg total) by mouth at bedtime as needed for sleep.    Dispense:  14 tablet    Refill:  0    May fill on or after Saturday Aug 17, 2016  . ALPRAZolam (XANAX) 1 MG tablet    Sig: Take 1-2 tabs po qhs prn sleep. This is a 1 week supply. May fill Saturday 08/10/16    Dispense:  14 tablet    Refill:  0  . ALPRAZolam (XANAX) 1 MG tablet    Sig: Take 1  tablet (1 mg total) by mouth at bedtime as needed for anxiety or sleep.    Dispense:  14 tablet    Refill:  0    May fill on or after Saturday August 31, 2016  . ALPRAZolam (XANAX) 1 MG tablet    Sig: Take 1 tablet (1 mg total) by mouth at bedtime as needed for sleep.    Dispense:  14 tablet    Refill:  0    May fill on or after Saturday March 3rd, 2018  . Codeine Polt-Chlorphen Polt ER (TUZISTRA XR) 14.7-2.8  MG/5ML SUER    Sig: Take 5 mLs by mouth 2 (two) times daily as needed.    Dispense:  180 mL    Refill:  0  . DISCONTD: hydrOXYzine (ATARAX/VISTARIL) 50 MG tablet    Sig: Take 2 tablets (100 mg total) by mouth at bedtime. For sleep    Dispense:  60 tablet    Refill:  0  . ipratropium (ATROVENT) 0.03 % nasal spray    Sig: Place 2 sprays into the nose 4 (four) times daily.    Dispense:  30 mL    Refill:  1  . DISCONTD: ondansetron (ZOFRAN ODT) 4 MG disintegrating tablet    Sig: Take 1-2 tablets (4-8 mg total) by mouth every 8 (eight) hours as needed for nausea or vomiting.    Dispense:  180 tablet    Refill:  0    I personally performed the services described in this documentation, which was scribed in my presence. The recorded information has been reviewed and considered, and addended by me as needed.   [name], M.D.  Primary Care at [facility] [address] [place], [place] [address] [phone] phone [phone] fax  08/18/16 2:11 AM

## 2016-08-03 NOTE — Patient Instructions (Signed)
     IF you received an x-ray today, you will receive an invoice from [facility]. Please contact [facility] at [phone] with questions or concerns regarding your invoice.   IF you received labwork today, you will receive an invoice from [facility]. Please contact [facility] at [phone] with questions or concerns regarding your invoice.   Our billing staff will not be able to assist you with questions regarding bills from these companies.  You will be contacted with the lab results as soon as they are available. The fastest way to get your results is to activate your My Chart account. Instructions are located on the last page of this paperwork. If you have not heard from us regarding the results in 2 weeks, please contact this office.     

## 2016-08-04 LAB — COMPREHENSIVE METABOLIC PANEL
A/G RATIO: 1.7 (ref 1.2–2.2)
ALT: 10 IU/L (ref 0–32)
AST: 14 IU/L (ref 0–40)
Albumin: 4 g/dL (ref 3.6–4.8)
Alkaline Phosphatase: 124 IU/L — ABNORMAL HIGH (ref 39–117)
BUN/Creatinine Ratio: 7 — ABNORMAL LOW (ref 12–28)
BUN: 4 mg/dL — AB (ref 8–27)
Bilirubin Total: 0.3 mg/dL (ref 0.0–1.2)
CALCIUM: 8.7 mg/dL (ref 8.7–10.3)
CHLORIDE: 99 mmol/L (ref 96–106)
CO2: 30 mmol/L — ABNORMAL HIGH (ref 18–29)
Creatinine, Ser: 0.59 mg/dL (ref 0.57–1.00)
GFR calc Af Amer: 112 mL/min/{1.73_m2} (ref >59)
GFR, EST NON AFRICAN AMERICAN: 97 mL/min/{1.73_m2} (ref >59)
GLUCOSE: 107 mg/dL — AB (ref 65–99)
Globulin, Total: 2.3 g/dL (ref 1.5–4.5)
POTASSIUM: 3.4 mmol/L — AB (ref 3.5–5.2)
Sodium: 146 mmol/L — ABNORMAL HIGH (ref 134–144)
TOTAL PROTEIN: 6.3 g/dL (ref 6.0–8.5)

## 2016-08-05 ENCOUNTER — Ambulatory Visit: Payer: Medicare Other | Admitting: Family Medicine

## 2016-08-07 LAB — HPV GENOTYPES 16/18,45
HPV GENOTYPE 16: NEGATIVE
HPV GENOTYPE 18,45: NEGATIVE

## 2016-08-08 ENCOUNTER — Emergency Department (HOSPITAL_COMMUNITY)
Admission: EM | Admit: 2016-08-08 | Discharge: 2016-08-08 | Disposition: A | Discharge status: Home / Self Care | Payer: Medicare Other | Attending: Physician Assistant | Admitting: Physician Assistant

## 2016-08-08 ENCOUNTER — Emergency Department (HOSPITAL_COMMUNITY): Payer: Medicare Other

## 2016-08-08 ENCOUNTER — Encounter (HOSPITAL_COMMUNITY): Payer: Self-pay | Admitting: Emergency Medicine

## 2016-08-08 DIAGNOSIS — R410 Disorientation, unspecified: Secondary | ICD-10-CM | POA: Diagnosis not present

## 2016-08-08 DIAGNOSIS — F17210 Nicotine dependence, cigarettes, uncomplicated: Secondary | ICD-10-CM | POA: Insufficient documentation

## 2016-08-08 DIAGNOSIS — G8929 Other chronic pain: Secondary | ICD-10-CM | POA: Diagnosis not present

## 2016-08-08 DIAGNOSIS — I2510 Atherosclerotic heart disease of native coronary artery without angina pectoris: Secondary | ICD-10-CM | POA: Diagnosis not present

## 2016-08-08 DIAGNOSIS — Z79899 Other long term (current) drug therapy: Secondary | ICD-10-CM | POA: Diagnosis not present

## 2016-08-08 DIAGNOSIS — R531 Weakness: Secondary | ICD-10-CM | POA: Diagnosis present

## 2016-08-08 DIAGNOSIS — J449 Chronic obstructive pulmonary disease, unspecified: Secondary | ICD-10-CM | POA: Diagnosis not present

## 2016-08-08 LAB — COMPREHENSIVE METABOLIC PANEL
ALBUMIN: 3.9 g/dL (ref 3.5–5.0)
ALK PHOS: 99 U/L (ref 38–126)
ALT: 14 U/L (ref 14–54)
AST: 27 U/L (ref 15–41)
Anion gap: 10 (ref 5–15)
BILIRUBIN TOTAL: 0.5 mg/dL (ref 0.3–1.2)
BUN: 12 mg/dL (ref 6–20)
CO2: 27 mmol/L (ref 22–32)
Calcium: 8.9 mg/dL (ref 8.9–10.3)
Chloride: 104 mmol/L (ref 101–111)
Creatinine, Ser: 0.83 mg/dL (ref 0.44–1.00)
GFR calc Af Amer: >60 mL/min (ref >60)
GFR calc non Af Amer: >60 mL/min (ref >60)
GLUCOSE: 162 mg/dL — AB (ref 65–99)
Potassium: 4.1 mmol/L (ref 3.5–5.1)
Sodium: 141 mmol/L (ref 135–145)
TOTAL PROTEIN: 6.8 g/dL (ref 6.5–8.1)

## 2016-08-08 LAB — CBC WITH DIFFERENTIAL/PLATELET
BASOS ABS: 0.1 10*3/uL (ref 0.0–0.1)
BASOS PCT: 1 %
Eosinophils Absolute: 0.4 10*3/uL (ref 0.0–0.7)
Eosinophils Relative: 5 %
HEMATOCRIT: 38.4 % (ref 36.0–46.0)
HEMOGLOBIN: 12.5 g/dL (ref 12.0–15.0)
LYMPHS PCT: 21 %
Lymphs Abs: 1.6 10*3/uL (ref 0.7–4.0)
MCH: 29.6 pg (ref 26.0–34.0)
MCHC: 32.6 g/dL (ref 30.0–36.0)
MCV: 91 fL (ref 78.0–100.0)
MONOS PCT: 8 %
Monocytes Absolute: 0.6 10*3/uL (ref 0.1–1.0)
NEUTROS ABS: 5 10*3/uL (ref 1.7–7.7)
NEUTROS PCT: 65 %
Platelets: 260 10*3/uL (ref 150–400)
RBC: 4.22 MIL/uL (ref 3.87–5.11)
RDW: 14.7 % (ref 11.5–15.5)
WBC: 7.7 10*3/uL (ref 4.0–10.5)

## 2016-08-08 LAB — RAPID URINE DRUG SCREEN, HOSP PERFORMED
Amphetamines: NOT DETECTED
Barbiturates: NOT DETECTED
Benzodiazepines: POSITIVE — AB
Cocaine: NOT DETECTED
Opiates: POSITIVE — AB
Tetrahydrocannabinol: NOT DETECTED

## 2016-08-08 MED ORDER — LORAZEPAM 1 MG PO TABS
1.0000 mg | ORAL_TABLET | Freq: Once | ORAL | Status: AC
  Administered 2016-08-08: 1 mg via ORAL
  Filled 2016-08-08: qty 1

## 2016-08-08 NOTE — ED Notes (Signed)
Pt refusing to be stuck for labs. MD made aware.

## 2016-08-08 NOTE — ED Provider Notes (Signed)
[facility] DEPT Provider Note   CSN: [id number] Arrival date & time: 08/08/16  1558     History   Chief Complaint Chief Complaint  Patient presents with  . Weakness    HPI [name] is a 66 y.o. female.  HPI   Patient is a 66 year old female presenting from [place]. Patient was falling asleep and people called EMS to bring her here. Patient is arousable if you talk to her. She reports that she takes pain medications. She says she might have taken some earlier today. She denies any kind of trauma.  Past Medical History:  Diagnosis Date  . ACUTE DUODEN ULCER W/HEMORR PERF&OBSTRUCTION 06/26/2004   Qualifier: Diagnosis of  By: [name] MD, [name]   . Acute duodenal ulcer with hemorrhage and perforation, with obstruction ([place])   . Allergy   . Anemia   . Anginal pain ([place])    "many many years ago"  . Anxiety    sees [name] NP at Dr. Radonna Ricker office  . Arthritis    "hips" (09/06/2014)  . Asthma   . Barrett's esophagus   . Chronic abdominal pain    narcotic dependence, dr [facility]eng-dak [facility]  . 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 Complication of anesthesia    pt states had too much xanax on board - agitated   . COPD (chronic obstructive pulmonary disease) ([place])   . Depression   . Exertional shortness of breath   . FEVER, RECURRENT 08/11/2009   Qualifier: Diagnosis of  By: [name] MD, [name]   . Fx of fibula 07-28-11   left fibula, 2 places   . Fx two ribs-open 08-24-11   left 4th and 5th  . GASTRIC OUTLET OBSTRUCTION 08/28/2007   In July 2008 she underwent laparoscopic enterolysis, Nissen fundoplication over a [id number] bougie, single pledgeted suture colosure of the hiatus and a 3 suture wrap.  Lap truncal vagotomy and a loop gastrojejunostomy was performed.  This was revised in August of 2009 to a roux en Y gastrojejujostomy.     . Gastroparesis 06/03/2007   Qualifier: Diagnosis of  By: [name] MD, [name]   . GERD  (gastroesophageal reflux disease)   . Headache    "monthly" (09/06/2014)  . History of blood transfusion    "related to OR; maybe when I perforated that ulcer"  . History of hiatal hernia   . Lap Nissen + truncal vagotomy July 2008 05/13/2013  . LEG EDEMA 01/19/2008   Qualifier: Diagnosis of  By: [name] MD, [name]   . Menopause   . MENOPAUSE 01/07/2007   Qualifier: Diagnosis of  By: [name], CMA, [place]    . Myocardial infarction   . Narcotic abuse   . Peptic ulcer disease    dr Oletta Lamas  . Pneumonia, organism unspecified(486) 11/07/2010   Assoc with R Parapneumonic effusion 09/2010    - Tapped 10/05/10    - CxR resolved 11/07/2010    . S/P jejunostomy Feb 2016 07/26/2014    Patient Active Problem List   Diagnosis Date Noted  . COPD exacerbation ([place]) 06/22/2016  . Abdominal pain   . Hypoxia 04/08/2015  . Opioid use with withdrawal ([place])   . Protein-calorie malnutrition ([place])   . Episode of recurrent major depressive disorder ([place])   . Secondary hyperparathyroidism ([place]) 01/10/2015  . Vitamin D deficiency 01/10/2015  . 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 Anemia, iron deficiency 11/03/2014  . Chronic pain syndrome  10/24/2014  . Osteoporosis 10/05/2014  . Vertebral compression fracture ([place]) 10/05/2014  . Coronary artery disease due to calcified coronary lesion 10/05/2014  . Weight loss   . Tobacco abuse   . History of subtotal gastrectomy with Roux-en-Y recontruction   . Orthostatic hypotension 08/11/2014  . Severe protein-calorie malnutrition ([place]) 12/30/2013  . Constipation, chronic 07/10/2013  . Chronic abdominal pain   . COPD (chronic obstructive pulmonary disease) ([place]) 11/07/2010  . Major depressive disorder, recurrent episode, moderate ([place]) 06/06/2010  . 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 Anxiety state 01/07/2007  . Barrett's esophagus 07/03/2005    Past Surgical History:  Procedure Laterality Date  . BIOPSY THYROID  08-13-11   benign nodule, per Dr.  Melida Quitter   . CATARACT EXTRACTION W/ INTRAOCULAR LENS  IMPLANT, BILATERAL Bilateral   . COLONOSCOPY    . DILATION AND CURETTAGE OF UTERUS    . ESOPHAGOGASTRODUODENOSCOPY  12-29-09   dr qadeer at [facility], several gastric ulcers  . ESOPHAGOGASTRODUODENOSCOPY N/A 09/25/2012   Procedure: ESOPHAGOGASTRODUODENOSCOPY (EGD);  Surgeon: Beryle Beams, MD;  Location: [facility];  Service: Endoscopy;  Laterality: N/A;  . FRACTURE SURGERY    . GASTROJEJUNOSTOMY  02-20-08   Roux en Y gastrojejunsotomy w 1 foot Roux limb  . GASTROSTOMY N/A 07/26/2014   Procedure: LAPRASCOPIC ASSISTED OPEN PLACEMENT OF JEJUNOSTOMY TUBE;  Surgeon: Pedro Earls, MD;  Location: [facility];  Service: General;  Laterality: N/A;  . HERNIA REPAIR     "hiatal"  . JOINT REPLACEMENT    . LAPAROSCOPIC NISSEN FUNDOPLICATION    . LAPAROSCOPIC PARTIAL GASTRECTOMY N/A 06/29/2013   Procedure: Gastrectomy and GJ Tube placement;  Surgeon: Pedro Earls, MD;  Location: [facility];  Service: General;  Laterality: N/A;  . ORIF HIP FRACTURE Bilateral    "pins in both of them"  . REPAIR OF PERFORATED ULCER    . TONSILLECTOMY      OB History    No data available       Home Medications    Prior to Admission medications   Medication Sig Start Date End Date Taking? Authorizing Provider  albuterol (PROVENTIL HFA;VENTOLIN HFA) 108 (90 Base) MCG/ACT inhaler Inhale 2 puffs into the lungs every 4 (four) hours as needed (cough, shortness of breath or wheezing.). 08/01/15   Shawnee Knapp, MD  ALPRAZolam [name]) 1 MG tablet Take 1-2 tablets (1-2 mg total) by mouth at bedtime as needed for sleep. 08/03/16   Shawnee Knapp, MD  ALPRAZolam [name]) 1 MG tablet Take 1-2 tabs po qhs prn sleep. This is a 1 week supply. May fill Saturday 08/10/16 08/03/16   Shawnee Knapp, MD  ALPRAZolam [name]) 1 MG tablet Take 1 tablet (1 mg total) by mouth at bedtime as needed for anxiety or sleep. 08/03/16   Shawnee Knapp, MD  ALPRAZolam [name]) 1 MG tablet Take 1 tablet (1 mg total) by  mouth at bedtime as needed for sleep. 08/03/16   Shawnee Knapp, MD  Codeine Polt-Chlorphen Polt ER (TUZISTRA XR) 14.7-2.8 MG/5ML SUER Take 5 mLs by mouth 2 (two) times daily as needed. 08/03/16   Shawnee Knapp, MD  DULoxetine (CYMBALTA) 60 MG capsule TAKE (1) CAPSULE DAILY. 06/11/16   Shawnee Knapp, MD  furosemide (LASIX) 20 MG tablet TAKE 1 TABLET ONCE DAILY AS NEEDED FOR EDEMA. 05/29/16   Shawnee Knapp, MD  HYDROcodone-acetaminophen (NORCO) 10-325 MG tablet Take 1-2 tablets by mouth every 6 (six) hours as needed. This is a 1 week  supply. Ok to fill Saturday 08/10/16 08/03/16   Shawnee Knapp, MD  HYDROcodone-acetaminophen [facility]) 10-325 MG tablet Take 1-2 tablets by mouth every 6 (six) hours as needed. 08/03/16   Shawnee Knapp, MD  HYDROcodone-acetaminophen (NORCO) 10-325 MG tablet Take 1-2 tablets by mouth every 6 (six) hours as needed. 08/03/16   Shawnee Knapp, MD  HYDROcodone-acetaminophen (NORCO) 10-325 MG tablet Take 1-2 tablets by mouth every 6 (six) hours as needed. 08/03/16   Shawnee Knapp, MD  hydrOXYzine (ATARAX/VISTARIL) 50 MG tablet Take 2 tablets (100 mg total) by mouth at bedtime. For sleep 08/03/16   Shawnee Knapp, MD  ipratropium (ATROVENT) 0.03 % nasal spray Place 2 sprays into the nose 4 (four) times daily. 08/03/16   Shawnee Knapp, MD  ipratropium-albuterol (DUONEB) 0.5-2.5 (3) MG/3ML SOLN Take 3 mLs by nebulization 4 (four) times daily -  before meals and at bedtime. 06/29/16   Shawnee Knapp, MD  methocarbamol (ROBAXIN) 500 MG tablet Take 1-2 tablets (500-1,000 mg total) by mouth 4 (four) times daily as needed for muscle spasms. Patient not taking: Reported on 08/03/2016 06/18/16   Shawnee Knapp, MD  nicotine (NICODERM CQ - DOSED IN MG/24 HOURS) 21 mg/24hr patch Place 1 patch (21 mg total) onto the skin daily. Patient not taking: Reported on 08/03/2016 06/25/16   Annita Brod, MD  OLANZapine (ZYPREXA) 20 MG tablet Take 1 tablet (20 mg total) by mouth at bedtime. 04/12/16   Shawnee Knapp, MD  omeprazole (PRILOSEC) 40 MG capsule  TAKE ONE CAPSULE BY MOUTH TWICE DAILY 10/10/15   Shawnee Knapp, MD  ondansetron (ZOFRAN ODT) 4 MG disintegrating tablet Take 1-2 tablets (4-8 mg total) by mouth every 8 (eight) hours as needed for nausea or vomiting. 08/03/16   Shawnee Knapp, MD    Family History Family History  Problem Relation Age of Onset  . Cancer Mother     kidney, magliant tumor on face  . Stroke Mother   . Celiac disease Father   . Cancer Father     kidney and pancreatic    Social History Social History  Substance Use Topics  . Smoking status: Current Every Day Smoker    Packs/day: 2.00    Years: 50.00    Types: Cigarettes  . Smokeless tobacco: Never Used  . Alcohol use No     Allergies   Lithium; Celebrex [celecoxib]; Morphine; Quetiapine; and Varenicline tartrate   Review of Systems Review of Systems  Constitutional: Negative for activity change.  Respiratory: Negative for shortness of breath.   Cardiovascular: Negative for chest pain.  Psychiatric/Behavioral: Positive for decreased concentration.  All other systems reviewed and are negative.    Physical Exam Updated Vital Signs BP 116/99 (BP Location: Right Arm)   Pulse 90   Temp 98.2 F (36.8 C) (Oral)   Resp 18   Ht 5\' 4"  (1.626 m)   Wt 106 lb (48.1 kg)   SpO2 95%   BMI 18.19 kg/m   Physical Exam  Constitutional: She is oriented to person, place, and time. She appears well-developed and well-nourished.  HENT:  Head: Normocephalic and atraumatic.  Eyes: Right eye exhibits no discharge.  Cardiovascular: Normal rate, regular rhythm and normal heart sounds.   No murmur heard. Pulmonary/Chest: Effort normal and breath sounds normal. She has no wheezes. She has no rales.  Abdominal: Soft. She exhibits no distension. There is no tenderness.  Neurological: She is oriented to person, place, and time.  Patient awakes when you discuss with her. Otherwise she falls sleep. No hypoxia  Skin: Skin is warm and dry. She is not diaphoretic.    Psychiatric: She has a normal mood and affect. Her behavior is normal.  Nursing note and vitals reviewed.    ED Treatments / Results  Labs (all labs ordered are listed, but only abnormal results are displayed) Labs Reviewed  COMPREHENSIVE METABOLIC PANEL - Abnormal; Notable for the following:       Result Value   Glucose, Bld 162 (*)    All other components within normal limits  RAPID URINE DRUG SCREEN, HOSP PERFORMED - Abnormal; Notable for the following:    Opiates POSITIVE (*)    Benzodiazepines POSITIVE (*)    All other components within normal limits  CBC WITH DIFFERENTIAL/PLATELET    EKG  EKG Interpretation None       Radiology Ct Head Wo Contrast  Result Date: 08/08/2016 CLINICAL DATA:  Altered mental status EXAM: CT HEAD WITHOUT CONTRAST TECHNIQUE: Contiguous axial images were obtained from the base of the skull through the vertex without intravenous contrast. COMPARISON:  11/10/2015 FINDINGS: Brain: No evidence of acute infarction, hemorrhage, hydrocephalus, extra-axial collection or mass lesion/mass effect. Vascular: Atherosclerotic calcification. Skull: Normal. Negative for fracture or focal lesion. Sinuses/Orbits: No acute finding. IMPRESSION: Negative head CT. Electronically Signed   By: Monte Fantasia M.D.   On: 08/08/2016 18:50    Procedures Procedures (including critical care time)  Medications Ordered in ED Medications  LORazepam (ATIVAN) tablet 1 mg (1 mg Oral Given 08/08/16 1959)     Initial Impression / Assessment and Plan / ED Course  I have reviewed the triage vital signs and the nursing notes.  Pertinent labs & imaging results that were available during my care of the patient were reviewed by me and considered in my medical decision making (see chart for details).     Patient is a 66 year old female brought to the hospital because of falling asleep at the donuts. I believe is secondary to her taking narcotics. We will get CBC and Chem-7 to  make sure that there is no abnormality that is causing her altered mental status otherwise. However we will observe her and likely be able to dispo home.   Patient much more alert.  Patient intermittently wandering around the department. She initially called a cab since that she could walk home then decided against it.  Patient's friend came to pick her up and felt that she was at baseline. Patient's friend feels that she can take care of her at home.  Final Clinical Impressions(s) / ED Diagnoses   Final diagnoses:  Confusion    New Prescriptions Discharge Medication List as of 08/08/2016  9:40 PM       Artrell Lawless Julio Alm, MD 08/09/16 [id number]

## 2016-08-08 NOTE — ED Notes (Signed)
Bed: [id number] Expected date:  Expected time:  Means of arrival:  Comments: EMS- possible overdose
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
## 2016-08-08 NOTE — ED Notes (Signed)
RN tried conversing with the patient about what and how much medication she has taken today. Pt seems irritable and states that she does not know what or how much medication she has taken but that she, "took something to knock her out."  Otherwise, pt's speech is difficult to understand.

## 2016-08-13 ENCOUNTER — Telehealth: Payer: Self-pay

## 2016-08-13 ENCOUNTER — Ambulatory Visit (INDEPENDENT_AMBULATORY_CARE_PROVIDER_SITE_OTHER): Payer: Medicare Other | Admitting: Family Medicine

## 2016-08-13 VITALS — BP 130/80 | HR 85 | Temp 98.2°F | Resp 18 | Ht 64.0 in | Wt 109.0 lb

## 2016-08-13 DIAGNOSIS — H3530 Unspecified macular degeneration: Secondary | ICD-10-CM

## 2016-08-13 DIAGNOSIS — H547 Unspecified visual loss: Secondary | ICD-10-CM | POA: Diagnosis not present

## 2016-08-13 MED ORDER — ONDANSETRON 4 MG PO TBDP: 4.0000 mg | ORAL_TABLET | Freq: Three times a day (TID) | ORAL | 0 refills | Status: DC | PRN

## 2016-08-13 MED ORDER — HYDROXYZINE HCL 50 MG PO TABS: 100.0000 mg | ORAL_TABLET | Freq: Every day | ORAL | 0 refills | Status: DC

## 2016-08-13 NOTE — Telephone Encounter (Signed)
Medications filled of the hydroxyzine and zofran. Attempted to contact patient, went to voicemail.

## 2016-08-13 NOTE — Patient Instructions (Signed)
     IF you received an x-ray today, you will receive an invoice from [facility]. Please contact  Radiology at [phone] with questions or concerns regarding your invoice.   IF you received labwork today, you will receive an invoice from [facility]. Please contact [facility] at [phone] with questions or concerns regarding your invoice.   Our billing staff will not be able to assist you with questions regarding bills from these companies.  You will be contacted with the lab results as soon as they are available. The fastest way to get your results is to activate your My Chart account. Instructions are located on the last page of this paperwork. If you have not heard from us regarding the results in 2 weeks, please contact this office.     
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
## 2016-08-13 NOTE — Telephone Encounter (Signed)
PATIENT STATES SHE SAW DR. SHAW TODAY AND SHE FORGOT TO GET HER TO REFILL HER ONDANSETRON 4 MG FOR NAUSEA AND HYDROXYZINE 50 MG TO HELP HER SLEEP. SHE WOULD LIKE IT DONE AS SOON AS POSSIBLE. BEST PHONE [phone] (CELL) PHARMACY CHOICE IS [facility]. [place]

## 2016-08-15 NOTE — Telephone Encounter (Signed)
Patient notified by phone.

## 2016-08-16 ENCOUNTER — Ambulatory Visit (INDEPENDENT_AMBULATORY_CARE_PROVIDER_SITE_OTHER): Payer: Medicare Other | Admitting: Family Medicine

## 2016-08-16 VITALS — BP 112/75 | HR 83 | Temp 98.4°F | Resp 16 | Ht 64.0 in | Wt 105.4 lb

## 2016-08-16 DIAGNOSIS — J441 Chronic obstructive pulmonary disease with (acute) exacerbation: Secondary | ICD-10-CM

## 2016-08-16 DIAGNOSIS — J431 Panlobular emphysema: Secondary | ICD-10-CM

## 2016-08-16 DIAGNOSIS — R059 Cough, unspecified: Secondary | ICD-10-CM

## 2016-08-16 DIAGNOSIS — R05 Cough: Secondary | ICD-10-CM

## 2016-08-16 MED ORDER — BENZONATATE 200 MG PO CAPS: 200.0000 mg | ORAL_CAPSULE | Freq: Two times a day (BID) | ORAL | 0 refills | Status: DC | PRN

## 2016-08-16 MED ORDER — PREDNISONE 20 MG PO TABS: 40.0000 mg | ORAL_TABLET | Freq: Every day | ORAL | 0 refills | Status: AC

## 2016-08-16 NOTE — Patient Instructions (Addendum)
   IF you received an x-ray today, you will receive an invoice from [facility]. Please contact [facility] at [phone] with questions or concerns regarding your invoice.   IF you received labwork today, you will receive an invoice from [facility]. Please contact [facility] at [phone] with questions or concerns regarding your invoice.   Our billing staff will not be able to assist you with questions regarding bills from these companies.  You will be contacted with the lab results as soon as they are available. The fastest way to get your results is to activate your My Chart account. Instructions are located on the last page of this paperwork. If you have not heard from us regarding the results in 2 weeks, please contact this office.     Chronic Obstructive Pulmonary Disease Chronic obstructive pulmonary disease (COPD) is a common lung problem. In COPD, the flow of air from the lungs is limited. The way your lungs work will probably never return to normal, but there are things you can do to improve your lungs and make yourself feel better. Your doctor may treat your condition with:  Medicines.  Oxygen.  Lung surgery.  Changes to your diet.  Rehabilitation. This may involve a team of specialists. Follow these instructions at home:  Take all medicines as told by your doctor.  Avoid medicines or cough syrups that dry up your airway (such as antihistamines) and do not allow you to get rid of thick spit. You do not need to avoid them if told differently by your doctor.  If you smoke, stop. Smoking makes the problem worse.  Avoid being around things that make your breathing worse (like smoke, chemicals, and fumes).  Use oxygen therapy and therapy to help improve your lungs (pulmonary rehabilitation) if told by your doctor. If you need home oxygen therapy, ask your doctor if you should buy a tool to measure your oxygen level (oximeter).  Avoid people who have a  sickness you can catch (contagious).  Avoid going outside when it is very hot, cold, or humid.  Eat healthy foods. Eat smaller meals more often. Rest before meals.  Stay active, but remember to also rest.  Make sure to get all the shots (vaccines) your doctor recommends. Ask your doctor if you need a pneumonia shot.  Learn and use tips on how to relax.  Learn and use tips on how to control your breathing as told by your doctor. Try: 1. Breathing in (inhaling) through your nose for 1 second. Then, pucker your lips and breath out (exhale) through your lips for 2 seconds. 2. Putting one hand on your belly (abdomen). Breathe in slowly through your nose for 1 second. Your hand on your belly should move out. Pucker your lips and breathe out slowly through your lips. Your hand on your belly should move in as you breathe out.  Learn and use controlled coughing to clear thick spit from your lungs. The steps are: 1. Lean your head a little forward. 2. Breathe in deeply. 3. Try to hold your breath for 3 seconds. 4. Keep your mouth slightly open while coughing 2 times. 5. Spit any thick spit out into a tissue. 6. Rest and do the steps again 1 or 2 times as needed. Contact a doctor if:  You cough up more thick spit than usual.  There is a change in the color or thickness of the spit.  It is harder to breathe than usual.  Your breathing is faster   than usual. Get help right away if:  You have shortness of breath while resting.  You have shortness of breath that stops you from:  Being able to talk.  Doing normal activities.  You chest hurts for longer than 5 minutes.  Your skin color is more blue than usual.  Your pulse oximeter shows that you have low oxygen for longer than 5 minutes. This information is not intended to replace advice given to you by your health care provider. Make sure you discuss any questions you have with your health care provider. Document Released: 11/27/2007  Document Revised: 11/16/2015 Document Reviewed: 02/04/2013 Elsevier Interactive Patient Education  2017 Elsevier Inc.  

## 2016-08-16 NOTE — Progress Notes (Signed)
Chief Complaint  Patient presents with  . Night Sweats  . Fever  . Cough    HPI   Pt was seen 08/13/16 she was prescribed cough medication that she could not afford She reports that has been having a dry persistent cough She was seen on 08/13/16 She reports that the cough medication with codeine is the "only thing that can help her cough" She is drinking water She was also prescribed zofran and hydroxyzine  She was smoking 4 packs per day and has cut down to 1 pack per day She has been smoking since 7th grade.   Past Medical History:  Diagnosis Date  . ACUTE DUODEN ULCER W/HEMORR PERF&OBSTRUCTION 06/26/2004   Qualifier: Diagnosis of  By: [name] MD, [name]   . Acute duodenal ulcer with hemorrhage and perforation, with obstruction ([place])   . Allergy   . Anemia   . Anginal pain ([place])    "many many years ago"  . Anxiety    sees [name] NP at Dr. Radonna Ricker office  . Arthritis    "hips" (09/06/2014)  . Asthma   . Barrett's esophagus   . Chronic abdominal pain    narcotic dependence, dr gyarteng-dak at [facility]  . 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 Complication of anesthesia    pt states had too much xanax on board - agitated   . COPD (chronic obstructive pulmonary disease) ([place])   . Depression   . Exertional shortness of breath   . FEVER, RECURRENT 08/11/2009   Qualifier: Diagnosis of  By: [name] MD, [name]   . Fx of fibula 07-28-11   left fibula, 2 places   . Fx two ribs-open 08-24-11   left 4th and 5th  . GASTRIC OUTLET OBSTRUCTION 08/28/2007   In July 2008 she underwent laparoscopic enterolysis, Nissen fundoplication over a [id number] bougie, single pledgeted suture colosure of the hiatus and a 3 suture wrap.  Lap truncal vagotomy and a loop gastrojejunostomy was performed.  This was revised in August of 2009 to a roux en Y gastrojejujostomy.     . Gastroparesis 06/03/2007   Qualifier: Diagnosis of  By: [name] MD, [name]   . GERD  (gastroesophageal reflux disease)   . Headache    "monthly" (09/06/2014)  . History of blood transfusion    "related to OR; maybe when I perforated that ulcer"  . History of hiatal hernia   . Lap Nissen + truncal vagotomy July 2008 05/13/2013  . LEG EDEMA 01/19/2008   Qualifier: Diagnosis of  By: [name] MD, [name]   . Menopause   . MENOPAUSE 01/07/2007   Qualifier: Diagnosis of  By: [name], CMA, [place]    . Myocardial infarction   . Narcotic abuse   . Peptic ulcer disease    dr Oletta Lamas  . Pneumonia, organism unspecified(486) 11/07/2010   Assoc with R Parapneumonic effusion 09/2010    - Tapped 10/05/10    - CxR resolved 11/07/2010    . S/P jejunostomy Feb 2016 07/26/2014    Current Outpatient Prescriptions  Medication Sig Dispense Refill  . albuterol (PROVENTIL HFA;VENTOLIN HFA) 108 (90 Base) MCG/ACT inhaler Inhale 2 puffs into the lungs every 4 (four) hours as needed (cough, shortness of breath or wheezing.). 1 Inhaler 3  . ALPRAZolam (XANAX) 1 MG tablet Take 1-2 tablets (1-2 mg total) by mouth at bedtime as needed for sleep. 14 tablet 0  . DULoxetine (CYMBALTA) 60 MG capsule TAKE (1) CAPSULE DAILY.  30 capsule 0  . furosemide (LASIX) 20 MG tablet TAKE 1 TABLET ONCE DAILY AS NEEDED FOR EDEMA. 30 tablet 0  . HYDROcodone-acetaminophen (NORCO) 10-325 MG tablet Take 1-2 tablets by mouth every 6 (six) hours as needed. This is a 1 week supply. Ok to fill Saturday 08/10/16 60 tablet 0  . hydrOXYzine (ATARAX/VISTARIL) 50 MG tablet Take 2 tablets (100 mg total) by mouth at bedtime. For sleep 60 tablet 0  . ipratropium (ATROVENT) 0.03 % nasal spray Place 2 sprays into the nose 4 (four) times daily. 30 mL 1  . ipratropium-albuterol (DUONEB) 0.5-2.5 (3) MG/3ML SOLN Take 3 mLs by nebulization 4 (four) times daily -  before meals and at bedtime. 360 mL 3  . methocarbamol (ROBAXIN) 500 MG tablet Take 1-2 tablets (500-1,000 mg total) by mouth 4 (four) times daily as needed for muscle spasms. 60 tablet 0  .  nicotine (NICODERM CQ - DOSED IN MG/24 HOURS) 21 mg/24hr patch Place 1 patch (21 mg total) onto the skin daily. 28 patch 0  . OLANZapine (ZYPREXA) 20 MG tablet Take 1 tablet (20 mg total) by mouth at bedtime. 30 tablet 2  . omeprazole (PRILOSEC) 40 MG capsule TAKE ONE CAPSULE BY MOUTH TWICE DAILY 180 capsule 3  . ondansetron (ZOFRAN ODT) 4 MG disintegrating tablet Take 1-2 tablets (4-8 mg total) by mouth every 8 (eight) hours as needed for nausea or vomiting. 180 tablet 0  . ALPRAZolam (XANAX) 1 MG tablet Take 1-2 tabs po qhs prn sleep. This is a 1 week supply. May fill Saturday 08/10/16 (Patient not taking: Reported on 08/16/2016) 14 tablet 0  . ALPRAZolam (XANAX) 1 MG tablet Take 1 tablet (1 mg total) by mouth at bedtime as needed for anxiety or sleep. (Patient not taking: Reported on 08/16/2016) 14 tablet 0  . ALPRAZolam (XANAX) 1 MG tablet Take 1 tablet (1 mg total) by mouth at bedtime as needed for sleep. (Patient not taking: Reported on 08/16/2016) 14 tablet 0  . Codeine Polt-Chlorphen Polt ER (TUZISTRA XR) 14.7-2.8 MG/5ML SUER Take 5 mLs by mouth 2 (two) times daily as needed. (Patient not taking: Reported on 08/16/2016) 180 mL 0  . HYDROcodone-acetaminophen (NORCO) 10-325 MG tablet Take 1-2 tablets by mouth every 6 (six) hours as needed. (Patient not taking: Reported on 08/16/2016) 60 tablet 0  . HYDROcodone-acetaminophen (NORCO) 10-325 MG tablet Take 1-2 tablets by mouth every 6 (six) hours as needed. (Patient not taking: Reported on 08/16/2016) 60 tablet 0  . HYDROcodone-acetaminophen (NORCO) 10-325 MG tablet Take 1-2 tablets by mouth every 6 (six) hours as needed. (Patient not taking: Reported on 08/16/2016) 60 tablet 0  . predniSONE (DELTASONE) 20 MG tablet Take 2 tablets (40 mg total) by mouth daily with breakfast. 10 tablet 0   No current facility-administered medications for this visit.     Allergies:  Allergies  Allergen Reactions  . Lithium Nausea And Vomiting  . Celebrex [Celecoxib]  Other (See Comments)    History of bleeding ulcers  . Morphine Itching  . Quetiapine Other (See Comments)     tardive dyskinesia  . Varenicline Tartrate Other (See Comments)    hallucinations    Past Surgical History:  Procedure Laterality Date  . BIOPSY THYROID  08-13-11   benign nodule, per Dr. Melida Quitter   . CATARACT EXTRACTION W/ INTRAOCULAR LENS  IMPLANT, BILATERAL Bilateral   . COLONOSCOPY    . DILATION AND CURETTAGE OF UTERUS    . ESOPHAGOGASTRODUODENOSCOPY  12-29-09   dr Elouise Munroe  at [facility], several gastric ulcers  . ESOPHAGOGASTRODUODENOSCOPY N/A 09/25/2012   Procedure: ESOPHAGOGASTRODUODENOSCOPY (EGD);  Surgeon: Beryle Beams, MD;  Location: [facility];  Service: Endoscopy;  Laterality: N/A;  . FRACTURE SURGERY    . GASTROJEJUNOSTOMY  02-20-08   Roux en Y gastrojejunsotomy w 1 foot Roux limb  . GASTROSTOMY N/A 07/26/2014   Procedure: LAPRASCOPIC ASSISTED OPEN PLACEMENT OF JEJUNOSTOMY TUBE;  Surgeon: Pedro Earls, MD;  Location: [facility];  Service: General;  Laterality: N/A;  . HERNIA REPAIR     "hiatal"  . JOINT REPLACEMENT    . LAPAROSCOPIC NISSEN FUNDOPLICATION    . LAPAROSCOPIC PARTIAL GASTRECTOMY N/A 06/29/2013   Procedure: Gastrectomy and GJ Tube placement;  Surgeon: Pedro Earls, MD;  Location: [facility];  Service: General;  Laterality: N/A;  . ORIF HIP FRACTURE Bilateral    "pins in both of them"  . REPAIR OF PERFORATED ULCER    . TONSILLECTOMY      Social History   Social History  . Marital status: Divorced    Spouse name: N/A  . Number of children: N/A  . Years of education: N/A   Social History Main Topics  . Smoking status: Current Every Day Smoker    Packs/day: 2.00    Years: 50.00    Types: Cigarettes  . Smokeless tobacco: Never Used  . Alcohol use No  . Drug use: No  . Sexual activity: No   Other Topics Concern  . None   Social History Narrative  . None    Review of Systems  Constitutional: Positive for diaphoresis. Negative for  chills and fever.  Respiratory: Positive for cough, shortness of breath and wheezing.   Cardiovascular: Negative for chest pain and palpitations.  Gastrointestinal: Negative for nausea and vomiting.    Objective: Vitals:   08/16/16 1116  BP: 112/75  Pulse: 83  Resp: 16  Temp: 98.4 F (36.9 C)  TempSrc: Oral  SpO2: 97%  Weight: 105 lb 6.4 oz (47.8 kg)  Height: 5\' 4"  (1.626 m)    Physical Exam  General: alert, oriented, in NAD Head: normocephalic, atraumatic, no sinus tenderness Eyes: EOM intact, no scleral icterus or conjunctival injection Ears: TM clear bilaterally Throat: no pharyngeal exudate or erythema Lymph: no posterior auricular, submental or cervical lymph adenopathy Heart: normal rate, normal sinus rhythm, no murmurs Lungs: end expiratory wheezing in the LUL of the lung  Assessment and Plan Arriona was seen today for night sweats, fever and cough.  Diagnoses and all orders for this visit:  COPD exacerbation ([place]) Cough Panlobular emphysema ([place])  Medical Decision Making Pt with end expiratory wheeze but otherwise stable labs Educated pt that she should be using her oxygen at rest and with activity Since cough is nonproductive and pt is afebrile will give a steroid burst and tessalon perles Advised pt to try to continue to cut down and to avoid vaping Discussed that she should follow up in one week  -     predniSONE (DELTASONE) 20 MG tablet; Take 2 tablets (40 mg total) by mouth daily with breakfast.   A total of 30 minutes were spent face-to-face with the patient during this encounter and over half of that time was spent on counseling and coordination of care.   [place]

## 2016-08-20 ENCOUNTER — Ambulatory Visit (INDEPENDENT_AMBULATORY_CARE_PROVIDER_SITE_OTHER): Payer: Medicare Other | Admitting: Family Medicine

## 2016-08-20 VITALS — BP 102/62 | HR 76 | Temp 98.5°F | Resp 16 | Ht 64.0 in | Wt 106.2 lb

## 2016-08-20 DIAGNOSIS — J449 Chronic obstructive pulmonary disease, unspecified: Secondary | ICD-10-CM

## 2016-08-20 DIAGNOSIS — H3530 Unspecified macular degeneration: Secondary | ICD-10-CM | POA: Diagnosis not present

## 2016-08-20 DIAGNOSIS — Z9189 Other specified personal risk factors, not elsewhere classified: Secondary | ICD-10-CM | POA: Diagnosis not present

## 2016-08-20 NOTE — Patient Instructions (Addendum)
We recommend that you schedule a mammogram for breast cancer screening. Typically, you do not need a referral to do this. Please contact a local imaging center to schedule your mammogram.  [facility] - [phone]  *ask for the Radiology Department [facility] - [phone] or [phone]  [facility] - [phone] [facility] - [phone] [facility] - [phone]  *ask for the Radiology Department [facility] - [phone]  *ask for the Radiology Department [facility] - [phone]  *ask for the Mammography Department [facility] - [phone]     IF you received an x-ray today, you will receive an invoice from [facility]. Please contact [facility] at [phone] with questions or concerns regarding your invoice.   IF you received labwork today, you will receive an invoice from [facility]. Please contact [facility] at [phone] with questions or concerns regarding your invoice.   Our billing staff will not be able to assist you with questions regarding bills from these companies.  You will be contacted with the lab results as soon as they are available. The fastest way to get your results is to activate your My Chart account. Instructions are located on the last page of this paperwork. If you have not heard from us regarding the results in 2 weeks, please contact this office.      

## 2016-08-20 NOTE — Telephone Encounter (Signed)
Completed and placed in fax box 
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
## 2016-08-21 DIAGNOSIS — J449 Chronic obstructive pulmonary disease, unspecified: Secondary | ICD-10-CM | POA: Diagnosis not present

## 2016-08-29 ENCOUNTER — Other Ambulatory Visit: Payer: Self-pay | Admitting: Family Medicine

## 2016-08-31 ENCOUNTER — Telehealth: Payer: Self-pay | Admitting: Family Medicine

## 2016-08-31 ENCOUNTER — Ambulatory Visit (INDEPENDENT_AMBULATORY_CARE_PROVIDER_SITE_OTHER): Payer: Medicare Other | Admitting: Family Medicine

## 2016-08-31 VITALS — BP 110/72 | HR 88 | Temp 98.3°F | Resp 24 | Ht 63.5 in | Wt 102.0 lb

## 2016-08-31 DIAGNOSIS — R197 Diarrhea, unspecified: Secondary | ICD-10-CM

## 2016-08-31 DIAGNOSIS — R109 Unspecified abdominal pain: Secondary | ICD-10-CM

## 2016-08-31 LAB — POCT URINALYSIS DIP (MANUAL ENTRY)
BILIRUBIN UA: NEGATIVE
GLUCOSE UA: NEGATIVE
Ketones, POC UA: NEGATIVE
LEUKOCYTES UA: NEGATIVE
Nitrite, UA: NEGATIVE
PH UA: 5.5
RBC UA: NEGATIVE
Spec Grav, UA: 1.02
Urobilinogen, UA: 0.2

## 2016-08-31 LAB — POC MICROSCOPIC URINALYSIS (UMFC)

## 2016-08-31 MED ORDER — HYDROCODONE-ACETAMINOPHEN 10-325 MG PO TABS: 1.0000 | ORAL_TABLET | Freq: Four times a day (QID) | ORAL | 0 refills | Status: DC | PRN

## 2016-08-31 MED ORDER — ALPRAZOLAM 0.5 MG PO TABS: ORAL_TABLET | ORAL | 1 refills | Status: DC

## 2016-08-31 NOTE — Progress Notes (Signed)
Subjective:  By signing my name below, I, [name], attest that this documentation has been prepared under the direction and in the presence of [name], MD Electronically Signed: Ladene Artist, ED Scribe 08/31/2016 at 12:04 PM.   Patient ID: [name], female    DOB: 22-Oct-1950, 66 y.o.   MRN: [id number]  Chief Complaint  Patient presents with  . Medication Refill  . Diarrhea  . Abdominal Pain   HPI  [name] is a 66 y.o. female who presents to Primary Care at [facility] complaining of epigastric abdominal pain for th past 3-4 days. Pt reports 3-4 episodes of diarrhea per day since onset yesterday. She has taken Imodium but has not needed to take Zofran more often. Pt denies fever, chills, emesis, loss of appetite, nausea, blood in stools, melena. She states that [name] has been ill with similar symptoms.   Insomnia  Pt states that Xanax is not assisting with sleep and agrees to stop taking it in order to discuss other options for pain management and insomnia.   Past Medical History:  Diagnosis Date  . ACUTE DUODEN ULCER W/HEMORR PERF&OBSTRUCTION 06/26/2004   Qualifier: Diagnosis of  By: [name] MD, [name]   . Acute duodenal ulcer with hemorrhage and perforation, with obstruction ([place])   . Allergy   . Anemia   . Anginal pain ([place])    "many many years ago"  . Anxiety    sees [name] NP at Dr. Radonna Ricker office  . Arthritis    "hips" (09/06/2014)  . Asthma   . Barrett's esophagus   . Chronic abdominal pain    narcotic dependence, [facility]eng-dak [facility]  . 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 Complication of anesthesia    pt states had too much xanax on board - agitated   . COPD (chronic obstructive pulmonary disease) ([place])   . Depression   . Exertional shortness of breath   . FEVER, RECURRENT 08/11/2009   Qualifier: Diagnosis of  By: [name] MD, [name]   . Fx of fibula 07-28-11   left fibula, 2 places   . Fx two  ribs-open 08-24-11   left 4th and 5th  . GASTRIC OUTLET OBSTRUCTION 08/28/2007   In July 2008 she underwent laparoscopic enterolysis, Nissen fundoplication over a #93 bougie, single pledgeted suture colosure of the hiatus and a 3 suture wrap.  Lap truncal vagotomy and a loop gastrojejunostomy was performed.  This was revised in August of 2009 to a roux en Y gastrojejujostomy.     . Gastroparesis 06/03/2007   Qualifier: Diagnosis of  By: [name] MD, [name]   . GERD (gastroesophageal reflux disease)   . Headache    "monthly" (09/06/2014)  . History of blood transfusion    "related to OR; maybe when I perforated that ulcer"  . History of hiatal hernia   . Lap Nissen + truncal vagotomy July 2008 05/13/2013  . LEG EDEMA 01/19/2008   Qualifier: Diagnosis of  By: [name] MD, [name]   . Menopause   . MENOPAUSE 01/07/2007   Qualifier: Diagnosis of  By: [name], CMA, [place]    . Myocardial infarction   . Narcotic abuse   . Peptic ulcer disease    dr Oletta Lamas  . Pneumonia, organism unspecified(486) 11/07/2010   Assoc with R Parapneumonic effusion 09/2010    - Tapped 10/05/10    - CxR resolved 11/07/2010    . S/P jejunostomy Feb 2016  07/26/2014   Current Outpatient Prescriptions on File Prior to Visit  Medication Sig Dispense Refill  . albuterol (PROVENTIL HFA;VENTOLIN HFA) 108 (90 Base) MCG/ACT inhaler Inhale 2 puffs into the lungs every 4 (four) hours as needed (cough, shortness of breath or wheezing.). 1 Inhaler 3  . ALPRAZolam (XANAX) 1 MG tablet Take 1-2 tablets (1-2 mg total) by mouth at bedtime as needed for sleep. 14 tablet 0  . ALPRAZolam (XANAX) 1 MG tablet Take 1-2 tabs po qhs prn sleep. This is a 1 week supply. May fill Saturday 08/10/16 14 tablet 0  . ALPRAZolam (XANAX) 1 MG tablet Take 1 tablet (1 mg total) by mouth at bedtime as needed for anxiety or sleep. 14 tablet 0  . ALPRAZolam (XANAX) 1 MG tablet Take 1 tablet (1 mg total) by mouth at bedtime as needed for sleep. 14 tablet 0  . DULoxetine  (CYMBALTA) 60 MG capsule TAKE (1) CAPSULE DAILY. 30 capsule 0  . furosemide (LASIX) 20 MG tablet TAKE 1 TABLET ONCE DAILY AS NEEDED FOR EDEMA. 30 tablet 0  . HYDROcodone-acetaminophen (NORCO) 10-325 MG tablet Take 1-2 tablets by mouth every 6 (six) hours as needed. This is a 1 week supply. Ok to fill Saturday 08/10/16 60 tablet 0  . HYDROcodone-acetaminophen (NORCO) 10-325 MG tablet Take 1-2 tablets by mouth every 6 (six) hours as needed. 60 tablet 0  . HYDROcodone-acetaminophen (NORCO) 10-325 MG tablet Take 1-2 tablets by mouth every 6 (six) hours as needed. 60 tablet 0  . HYDROcodone-acetaminophen (NORCO) 10-325 MG tablet Take 1-2 tablets by mouth every 6 (six) hours as needed. 60 tablet 0  . hydrOXYzine (ATARAX/VISTARIL) 50 MG tablet Take 2 tablets (100 mg total) by mouth at bedtime. For sleep 60 tablet 0  . ipratropium (ATROVENT) 0.03 % nasal spray Place 2 sprays into the nose 4 (four) times daily. 30 mL 1  . ipratropium-albuterol (DUONEB) 0.5-2.5 (3) MG/3ML SOLN Take 3 mLs by nebulization 4 (four) times daily -  before meals and at bedtime. 360 mL 3  . OLANZapine (ZYPREXA) 20 MG tablet Take 1 tablet (20 mg total) by mouth at bedtime. 30 tablet 2  . omeprazole (PRILOSEC) 40 MG capsule TAKE ONE CAPSULE BY MOUTH TWICE DAILY 180 capsule 3  . ondansetron (ZOFRAN ODT) 4 MG disintegrating tablet Take 1-2 tablets (4-8 mg total) by mouth every 8 (eight) hours as needed for nausea or vomiting. 180 tablet 0  . benzonatate (TESSALON) 200 MG capsule Take 1 capsule (200 mg total) by mouth 2 (two) times daily as needed for cough. (Patient not taking: Reported on 08/20/2016) 30 capsule 0  . Codeine Polt-Chlorphen Polt ER (TUZISTRA XR) 14.7-2.8 MG/5ML SUER Take 5 mLs by mouth 2 (two) times daily as needed. (Patient not taking: Reported on 08/16/2016) 180 mL 0  . methocarbamol (ROBAXIN) 500 MG tablet Take 1-2 tablets (500-1,000 mg total) by mouth 4 (four) times daily as needed for muscle spasms. (Patient not taking:  Reported on 08/20/2016) 60 tablet 0  . nicotine (NICODERM CQ - DOSED IN MG/24 HOURS) 21 mg/24hr patch Place 1 patch (21 mg total) onto the skin daily. (Patient not taking: Reported on 08/20/2016) 28 patch 0   No current facility-administered medications on file prior to visit.    Allergies  Allergen Reactions  . Lithium Nausea And Vomiting  . Celebrex [Celecoxib] Other (See Comments)    History of bleeding ulcers  . Morphine Itching  . Quetiapine Other (See Comments)     tardive dyskinesia  .  Varenicline Tartrate Other (See Comments)    hallucinations   Review of Systems  Constitutional: Negative for appetite change, chills and fever.  Gastrointestinal: Positive for abdominal pain and diarrhea. Negative for blood in stool, nausea and vomiting.  Psychiatric/Behavioral: Positive for sleep disturbance.      Objective:   Physical Exam  Constitutional: She is oriented to person, place, and time. She appears well-developed and well-nourished. No distress.  HENT:  Head: Normocephalic and atraumatic.  Eyes: Conjunctivae and EOM are normal.  Neck: Neck supple. No tracheal deviation present.  Cardiovascular: Normal rate.   Pulmonary/Chest: Effort normal. No respiratory distress.  Abdominal: Bowel sounds are normal. There is no tenderness. There is no CVA tenderness.  Musculoskeletal: Normal range of motion.  Neurological: She is alert and oriented to person, place, and time.  Skin: Skin is warm and dry.  Psychiatric: She has a normal mood and affect. Her behavior is normal.  Nursing note and vitals reviewed.  BP 110/72   Pulse 88   Temp 98.3 F (36.8 C) (Oral)   Resp (!) 24   Ht 5' 3.5" (1.613 m)   Wt 102 lb (46.3 kg)   SpO2 95%   BMI 17.79 kg/m     Assessment & Plan:   1. Abdominal pain, unspecified abdominal location   2. Diarrhea, unspecified type     Orders Placed This Encounter  Procedures  . Fecal leukocytes  . Fecal Lactoferrin  . Care order/instruction:     Scheduling Instructions:     Complete orders, AVS and go.  Marland Kitchen POCT urinalysis dipstick  . POCT Microscopic Urinalysis ([facility])    Meds ordered this encounter  Medications  . DISCONTD: HYDROcodone-acetaminophen (NORCO) 10-325 MG tablet    Sig: Take 1-2 tablets by mouth every 6 (six) hours as needed. This is a 1 week supply. Ok to fill Saturday 09/07/16    Dispense:  60 tablet    Refill:  0  . DISCONTD: HYDROcodone-acetaminophen (NORCO) 10-325 MG tablet    Sig: Take 1-2 tablets by mouth every 6 (six) hours as needed.    Dispense:  60 tablet    Refill:  0    May fill on or after saturday 09/14/2016  . DISCONTD: HYDROcodone-acetaminophen (NORCO) 10-325 MG tablet    Sig: Take 1-2 tablets by mouth every 6 (six) hours as needed.    Dispense:  60 tablet    Refill:  0    May fill on or after Saturday 09/21/2016  . DISCONTD: HYDROcodone-acetaminophen (NORCO) 10-325 MG tablet    Sig: Take 1-2 tablets by mouth every 6 (six) hours as needed.    Dispense:  60 tablet    Refill:  0    May fill on or after Saturday 09/29/2015  . DISCONTD: ALPRAZolam (XANAX) 0.5 MG tablet    Sig: Take 1-2 tabs po x 1 as needed for severe alprazolam withdrawal symptoms.    Dispense:  10 tablet    Refill:  1    May fill on or after Saturday August 31, 2016 as long as no other alprazolam rxs have been filled within the week (since Sun 08/25/16).    I personally performed the services described in this documentation, which was scribed in my presence. The recorded information has been reviewed and considered, and addended by me as needed.   [name], M.D.  Primary Care at [facility] [address] [place], [place] [address] [phone] phone [phone] fax  10/17/16 9:55 PM

## 2016-08-31 NOTE — Telephone Encounter (Signed)
[facility] is calling about the instructions for there xanax rx that was scheduled to be filled today according to the date on the rx but instructions say 1 at bedtime # 14 but if it is only one pill to take she is not due today    Best number [phone]

## 2016-08-31 NOTE — Patient Instructions (Signed)
     IF you received an x-ray today, you will receive an invoice from [facility]. Please contact [facility] at [phone] with questions or concerns regarding your invoice.   IF you received labwork today, you will receive an invoice from [facility]. Please contact [facility] at [phone] with questions or concerns regarding your invoice.   Our billing staff will not be able to assist you with questions regarding bills from these companies.  You will be contacted with the lab results as soon as they are available. The fastest way to get your results is to activate your My Chart account. Instructions are located on the last page of this paperwork. If you have not heard from us regarding the results in 2 weeks, please contact this office.    We recommend that you schedule a mammogram for breast cancer screening. Typically, you do not need a referral to do this. Please contact a local imaging center to schedule your mammogram.  [facility] - [phone]  *ask for the Radiology Department [facility] - [phone] or [phone]  [facility] - [phone] [facility] - [phone] [facility] - [phone]  *ask for the Radiology Department [facility] - [phone]  *ask for the Radiology Department [facility] - [phone]  *ask for the Mammography Department [facility] - [phone] 

## 2016-08-31 NOTE — Telephone Encounter (Signed)
Pt was seen today by Dr. Brigitte Pulse Resolved

## 2016-09-07 ENCOUNTER — Other Ambulatory Visit: Payer: Self-pay | Admitting: Physician Assistant

## 2016-09-11 ENCOUNTER — Ambulatory Visit: Payer: Medicare Other

## 2016-09-12 ENCOUNTER — Ambulatory Visit (INDEPENDENT_AMBULATORY_CARE_PROVIDER_SITE_OTHER): Payer: Medicare Other | Admitting: Family Medicine

## 2016-09-12 ENCOUNTER — Encounter: Payer: Self-pay | Admitting: Family Medicine

## 2016-09-12 VITALS — BP 116/78 | HR 87 | Temp 97.0°F | Resp 16 | Ht 63.0 in | Wt 104.0 lb

## 2016-09-12 DIAGNOSIS — R110 Nausea: Secondary | ICD-10-CM

## 2016-09-12 DIAGNOSIS — R8299 Other abnormal findings in urine: Secondary | ICD-10-CM | POA: Diagnosis not present

## 2016-09-12 DIAGNOSIS — R82998 Other abnormal findings in urine: Secondary | ICD-10-CM

## 2016-09-12 DIAGNOSIS — R1084 Generalized abdominal pain: Secondary | ICD-10-CM

## 2016-09-12 DIAGNOSIS — F411 Generalized anxiety disorder: Secondary | ICD-10-CM | POA: Diagnosis not present

## 2016-09-12 DIAGNOSIS — G894 Chronic pain syndrome: Secondary | ICD-10-CM | POA: Diagnosis not present

## 2016-09-12 DIAGNOSIS — R809 Proteinuria, unspecified: Secondary | ICD-10-CM

## 2016-09-12 DIAGNOSIS — R636 Underweight: Secondary | ICD-10-CM | POA: Diagnosis not present

## 2016-09-12 LAB — POCT URINALYSIS DIP (MANUAL ENTRY)
BILIRUBIN UA: NEGATIVE
BILIRUBIN UA: NEGATIVE
GLUCOSE UA: NEGATIVE
NITRITE UA: NEGATIVE
PH UA: 5 (ref 5.0–8.0)
RBC UA: NEGATIVE
SPEC GRAV UA: 1.02 (ref 1.030–1.035)
Urobilinogen, UA: 0.2 (ref ?–2.0)

## 2016-09-12 MED ORDER — PROMETHAZINE HCL 6.25 MG/5ML PO SYRP: 25.0000 mg | ORAL_SOLUTION | Freq: Three times a day (TID) | ORAL | 0 refills | Status: DC | PRN

## 2016-09-12 MED ORDER — ONDANSETRON 4 MG PO TBDP: 4.0000 mg | ORAL_TABLET | Freq: Three times a day (TID) | ORAL | 11 refills | Status: DC | PRN

## 2016-09-12 MED ORDER — DULOXETINE HCL 60 MG PO CPEP: ORAL_CAPSULE | ORAL | 0 refills | Status: DC

## 2016-09-12 MED ORDER — OLANZAPINE 20 MG PO TABS: 20.0000 mg | ORAL_TABLET | Freq: Every day | ORAL | 0 refills | Status: DC

## 2016-09-12 NOTE — Progress Notes (Signed)
Subjective:    Patient ID: [name], female    DOB: 1950/09/20, 66 y.o.   MRN: [id number] Chief Complaint  Patient presents with  . Medication Refill    pt unsure of which medicine to refill, she left bottles in the car  . Nausea    x 3 days  . Medication Management    pt would like to talk about medication for stress    HPI   Has been in the bed for a week with nausea, no diarrhea, cannot vomii due to stomach surgery. Roommate with same thing. No f/c though felt hot yesterday. No abd pain. Borderline flu. Drinking lots of fluids but not much water - mainly coke.  Not driving, not at all. States she has not been to the eye doctor before cause has been stressed.  Has not xanax - can't sleep at all. States that the high doses of xanax worked well for sleep but nothing else has helped.   Is wearing oxygen all day except when eating. Sleeping with it on. Smoking 1 ppd  Past Medical History:  Diagnosis Date  . ACUTE DUODEN ULCER W/HEMORR PERF&OBSTRUCTION 06/26/2004   Qualifier: Diagnosis of  By: [name] MD, [name]   . Acute duodenal ulcer with hemorrhage and perforation, with obstruction ([place])   . Allergy   . Anemia   . Anginal pain ([place])    "many many years ago"  . Anxiety    sees [name] NP at Dr. Radonna Ricker office  . Arthritis    "hips" (09/06/2014)  . Asthma   . Barrett's esophagus   . Chronic abdominal pain    narcotic dependence, dr gyarteng-dak at [facility]  . 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 Complication of anesthesia    pt states had too much xanax on board - agitated   . COPD (chronic obstructive pulmonary disease) ([place])   . Depression   . Exertional shortness of breath   . FEVER, RECURRENT 08/11/2009   Qualifier: Diagnosis of  By: [name] MD, [name]   . Fx of fibula 07-28-11   left fibula, 2 places   . Fx two ribs-open 08-24-11   left 4th and 5th  . GASTRIC OUTLET OBSTRUCTION 08/28/2007   In July 2008 she underwent  laparoscopic enterolysis, Nissen fundoplication over a #40 bougie, single pledgeted suture colosure of the hiatus and a 3 suture wrap.  Lap truncal vagotomy and a loop gastrojejunostomy was performed.  This was revised in August of 2009 to a roux en Y gastrojejujostomy.     . Gastroparesis 06/03/2007   Qualifier: Diagnosis of  By: [name] MD, [name]   . GERD (gastroesophageal reflux disease)   . Headache    "monthly" (09/06/2014)  . History of blood transfusion    "related to OR; maybe when I perforated that ulcer"  . History of hiatal hernia   . Lap Nissen + truncal vagotomy July 2008 05/13/2013  . LEG EDEMA 01/19/2008   Qualifier: Diagnosis of  By: [name] MD, [name]   . Menopause   . MENOPAUSE 01/07/2007   Qualifier: Diagnosis of  By: [name], CMA, [place]    . Myocardial infarction   . Narcotic abuse   . Peptic ulcer disease    dr Oletta Lamas  . Pneumonia, organism unspecified(486) 11/07/2010   Assoc with R Parapneumonic effusion 09/2010    - Tapped 10/05/10    - CxR resolved 11/07/2010    . S/P jejunostomy  Feb 2016 07/26/2014   Past Surgical History:  Procedure Laterality Date  . BIOPSY THYROID  08-13-11   benign nodule, per Dr. Melida Quitter   . CATARACT EXTRACTION W/ INTRAOCULAR LENS  IMPLANT, BILATERAL Bilateral   . COLONOSCOPY    . DILATION AND CURETTAGE OF UTERUS    . ESOPHAGOGASTRODUODENOSCOPY  12-29-09   dr qadeer at [facility], several gastric ulcers  . ESOPHAGOGASTRODUODENOSCOPY N/A 09/25/2012   Procedure: ESOPHAGOGASTRODUODENOSCOPY (EGD);  Surgeon: Beryle Beams, MD;  Location: [facility];  Service: Endoscopy;  Laterality: N/A;  . FRACTURE SURGERY    . GASTROJEJUNOSTOMY  02-20-08   Roux en Y gastrojejunsotomy w 1 foot Roux limb  . GASTROSTOMY N/A 07/26/2014   Procedure: LAPRASCOPIC ASSISTED OPEN PLACEMENT OF JEJUNOSTOMY TUBE;  Surgeon: Pedro Earls, MD;  Location: [facility];  Service: General;  Laterality: N/A;  . HERNIA REPAIR     "hiatal"  . JOINT REPLACEMENT    . LAPAROSCOPIC NISSEN  FUNDOPLICATION    . LAPAROSCOPIC PARTIAL GASTRECTOMY N/A 06/29/2013   Procedure: Gastrectomy and GJ Tube placement;  Surgeon: Pedro Earls, MD;  Location: [facility];  Service: General;  Laterality: N/A;  . ORIF HIP FRACTURE Bilateral    "pins in both of them"  . REPAIR OF PERFORATED ULCER    . TONSILLECTOMY     Current Outpatient Prescriptions on File Prior to Visit  Medication Sig Dispense Refill  . albuterol (PROVENTIL HFA;VENTOLIN HFA) 108 (90 Base) MCG/ACT inhaler Inhale 2 puffs into the lungs every 4 (four) hours as needed (cough, shortness of breath or wheezing.). 1 Inhaler 3  . furosemide (LASIX) 20 MG tablet TAKE 1 TABLET ONCE DAILY AS NEEDED FOR EDEMA. 30 tablet 0  . HYDROcodone-acetaminophen (NORCO) 10-325 MG tablet Take 1-2 tablets by mouth every 6 (six) hours as needed. This is a 1 week supply. Ok to fill Saturday 09/07/16 60 tablet 0  . HYDROcodone-acetaminophen (NORCO) 10-325 MG tablet Take 1-2 tablets by mouth every 6 (six) hours as needed. 60 tablet 0  . HYDROcodone-acetaminophen (NORCO) 10-325 MG tablet Take 1-2 tablets by mouth every 6 (six) hours as needed. 60 tablet 0  . HYDROcodone-acetaminophen (NORCO) 10-325 MG tablet Take 1-2 tablets by mouth every 6 (six) hours as needed. 60 tablet 0  . hydrOXYzine (VISTARIL) 50 MG capsule TAKE 2 CAPSULES AT BEDTIME AS NEEDED FOR SLEEP. 60 capsule 0  . ipratropium (ATROVENT) 0.03 % nasal spray Place 2 sprays into the nose 4 (four) times daily. 30 mL 1  . ipratropium-albuterol (DUONEB) 0.5-2.5 (3) MG/3ML SOLN Take 3 mLs by nebulization 4 (four) times daily -  before meals and at bedtime. 360 mL 3  . omeprazole (PRILOSEC) 40 MG capsule TAKE ONE CAPSULE BY MOUTH TWICE DAILY 180 capsule 3   No current facility-administered medications on file prior to visit.    Allergies  Allergen Reactions  . Lithium Nausea And Vomiting  . Celebrex [Celecoxib] Other (See Comments)    History of bleeding ulcers  . Morphine Itching  . Quetiapine Other  (See Comments)     tardive dyskinesia  . Varenicline Tartrate Other (See Comments)    hallucinations   Family History  Problem Relation Age of Onset  . Cancer Mother     kidney, magliant tumor on face  . Stroke Mother   . Celiac disease Father   . Cancer Father     kidney and pancreatic   Social History   Social History  . Marital status: Divorced    Spouse  name: N/A  . Number of children: N/A  . Years of education: N/A   Social History Main Topics  . Smoking status: Current Every Day Smoker    Packs/day: 2.00    Years: 50.00    Types: Cigarettes  . Smokeless tobacco: Never Used  . Alcohol use No  . Drug use: No  . Sexual activity: No   Other Topics Concern  . None   Social History Narrative  . None   Depression screen [facility] 2/9 09/12/2016 08/31/2016 08/20/2016 08/13/2016 08/03/2016  Decreased Interest 0 3 0 0 1  Down, Depressed, Hopeless 0 3 3 0 1  PHQ - 2 Score 0 6 3 0 2  Altered sleeping - 3 - - 3  Tired, decreased energy - 3 - - 3  Change in appetite - 0 - - 3  Feeling bad or failure about yourself  - 3 - - 3  Trouble concentrating - 3 - - 3  Moving slowly or fidgety/restless - 0 - - 0  Suicidal thoughts - 0 - - 0  PHQ-9 Score - 18 - - 17  Difficult doing work/chores - Extremely dIfficult - - -  Some recent data might be hidden    Review of Systems See hpi    Objective:   Physical Exam  Constitutional: She is oriented to person, place, and time. She appears well-developed and well-nourished. No distress.  HENT:  Head: Normocephalic and atraumatic.  Neck: Normal range of motion. Neck supple. No thyromegaly present.  Cardiovascular: Normal rate, regular rhythm, normal heart sounds and intact distal pulses.   Pulmonary/Chest: Effort normal and breath sounds normal. No respiratory distress.  Abdominal: Soft. Bowel sounds are normal. She exhibits no distension. There is no tenderness. There is no rebound, no guarding and no CVA tenderness.  Chronic bruising  of abd skin from years of heating pad use  Musculoskeletal: She exhibits no edema.  Lymphadenopathy:    She has no cervical adenopathy.  Neurological: She is alert and oriented to person, place, and time.  Skin: Skin is warm and dry. She is not diaphoretic. No erythema.  Psychiatric: She has a normal mood and affect. Her speech is normal and behavior is normal. Thought content is paranoid. Cognition and memory are impaired. She expresses impulsivity and inappropriate judgment. She exhibits abnormal recent memory.     BP 116/78   Pulse 87   Temp 97 F (36.1 C) (Oral)   Resp 16   Ht 5\' 3"  (1.6 m)   Wt 104 lb (47.2 kg)   SpO2 96%   BMI 18.42 kg/m   Assessment & Plan:  Pt states she takes nausea meds chronically so will need a PA to get more at a time - #20 only lasts a week. Needs #90/mo  1. Nausea without vomiting   2. Chronic pain syndrome   3. Underweight   4. Anxiety state   Pt continues to c/o anxiety and insomnia. However, she should have ran out of both her cymbalta and her zyprexa 2 mos prior yet when I saw her just last week she had several full bottles of these so must not be taking. 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  Advised that I have tried every single thing I can think of for sleep and for anxiety. Pt  stating now that xanax works wonderfully (though of course when she was on it, it was never quite enough and she still asked for something stronger to sleep.) Pt agrees that she cannot come off of hydrocodone chronic so understands that my prescribing her xanax is no longer an option. Therefore, if she wants any further treatment for her anxiety and insomnia, she needs to see psych - pt agrees.  Advised pt that she cannot drive due to vision. Pt agrees and will have roommate drive her (as she did today). Pt  agrees to f/u with optho re her macular degeneration - states she didn't only because she has felt ill  Strongly encouraged pt to obtain med box with each day having several med time boxes and she can fill weekly. Encouraged to pursue home health aide.  Pt declines labs - feels like her GI sxs are sig improving.  Orders Placed This Encounter  Procedures  . Urine culture  . Ambulatory referral to Psychiatry    Referral Priority:   Routine    Referral Type:   Psychiatric    Referral Reason:   Specialty Services Required    Requested Specialty:   Psychiatry    Number of Visits Requested:   1  . POCT urinalysis dipstick    Meds ordered this encounter  Medications  . ondansetron (ZOFRAN ODT) 4 MG disintegrating tablet    Sig: Take 1-2 tablets (4-8 mg total) by mouth every 8 (eight) hours as needed for nausea or vomiting.    Dispense:  90 tablet    Refill:  11  . DULoxetine (CYMBALTA) 60 MG capsule    Sig: TAKE (1) CAPSULE DAILY.    Dispense:  30 capsule    Refill:  0  . OLANZapine (ZYPREXA) 20 MG tablet    Sig: Take 1 tablet (20 mg total) by mouth at bedtime.    Dispense:  30 tablet    Refill:  0    Failed risperdal, cannot tolerate seroquel due to tardive dyskinesia  . promethazine (PHENERGAN) 6.25 MG/5ML syrup    Sig: Take 20 mLs (25 mg total) by mouth every 8 (eight) hours as needed for nausea.    Dispense:  473 mL    Refill:  0     [name], M.D.  Primary Care at [facility] [address] [place], [place] [address] [phone] phone [phone] fax  09/14/16 2:41 AM

## 2016-09-12 NOTE — Patient Instructions (Addendum)
If you want to get back on xanax than we have to take you off of the pain medication hydrocodone which is clearly not an option for you.   if you want to sleep take  2 hydroxyzine and 1 olanzapine - you have not been taking the olanzapine - you always have a full bottle and you should have run out over 2 months ago.  We need to get you a home health aide to fill a med box - you need a med box that has boxes for medications at least 2-3x/d for a week that you can fill. You can use hydroxyzine as needed during the day for stress.  You also need to take the duloxetine every single day - this is to help with stress and pain.  I don't believe that you are taking the duloxetine (cymbalta) or olanzapine (zyprexa) regularly - you should have been out of these several months ago and you always have several full bottles when you bring in your medicines.  You will be due for refills on   You can call the 24-hour [place] at [phone] or [phone] to get help finding a provider in the area who will take your health sinruance. No psychological or psychiatric services take physician referrals - they always want the patient to call. Some excellent private psychiatrists for individual counseling are:  [facility]  [place], [place] [address] Phone: [phone]  [facility]  [address], [place], [place] [address]  Phone:([phone]) [place] [address], [place], [place] [address]  [phone]  [place], [id number] Phone: [phone]    IF you received an x-ray today, you will receive an invoice from [facility]. Please contact [facility] at [phone] with questions or concerns regarding your invoice.   IF you received labwork today, you will receive an invoice from  [facility]. Please contact [facility] at [phone] with questions or concerns regarding your invoice.   Our billing staff will not be able to assist you with questions regarding bills from these companies.  You will be contacted with the lab results as soon as they are available. The fastest way to get your results is to activate your My Chart account. Instructions are located on the last page of this paperwork. If you have not heard from [place] regarding the results in 2 weeks, please contact this office.     We recommend that you schedule a mammogram for breast cancer screening. Typically, you do not need a referral to do this. Please contact a local imaging center to schedule your mammogram.  [facility] - [phone]  *ask for the Radiology Department [facility] - [phone] or [phone]  [facility] - [phone] [place] [phone] [facility] - [phone]  *ask for the [facility] - [phone]  *ask for the Radiology Department [facility] - [phone]  *ask for the [place] - [phone]

## 2016-09-13 ENCOUNTER — Telehealth: Payer: Self-pay

## 2016-09-13 NOTE — Telephone Encounter (Signed)
At Dr. Raul Del request, I started a PA on patient for Zofran TID #90.  It is under review due to quantity, and may take up to 4-5 business days for a response.

## 2016-09-14 LAB — URINE CULTURE

## 2016-09-14 NOTE — Telephone Encounter (Signed)
No PA needed per insurance.

## 2016-09-18 DIAGNOSIS — J449 Chronic obstructive pulmonary disease, unspecified: Secondary | ICD-10-CM | POA: Diagnosis not present

## 2016-09-29 ENCOUNTER — Other Ambulatory Visit: Payer: Self-pay | Admitting: Family Medicine

## 2016-09-30 NOTE — Telephone Encounter (Signed)
09/12/16 last refill

## 2016-10-03 ENCOUNTER — Ambulatory Visit (INDEPENDENT_AMBULATORY_CARE_PROVIDER_SITE_OTHER): Payer: Medicare Other | Admitting: Family Medicine

## 2016-10-03 ENCOUNTER — Encounter: Payer: Self-pay | Admitting: Family Medicine

## 2016-10-03 VITALS — BP 120/73 | HR 93 | Temp 98.2°F | Resp 18 | Ht 63.0 in | Wt 100.6 lb

## 2016-10-03 DIAGNOSIS — R413 Other amnesia: Secondary | ICD-10-CM | POA: Diagnosis not present

## 2016-10-03 DIAGNOSIS — G4700 Insomnia, unspecified: Secondary | ICD-10-CM | POA: Diagnosis not present

## 2016-10-03 DIAGNOSIS — G894 Chronic pain syndrome: Secondary | ICD-10-CM

## 2016-10-03 DIAGNOSIS — Z79899 Other long term (current) drug therapy: Secondary | ICD-10-CM

## 2016-10-03 MED ORDER — HYDROCODONE-ACETAMINOPHEN 10-325 MG PO TABS: 1.0000 | ORAL_TABLET | Freq: Four times a day (QID) | ORAL | 0 refills | Status: DC | PRN

## 2016-10-03 MED ORDER — DONEPEZIL HCL 5 MG PO TABS: 5.0000 mg | ORAL_TABLET | Freq: Every day | ORAL | 0 refills | Status: DC

## 2016-10-03 NOTE — Progress Notes (Signed)
Subjective:    Patient ID: [name], female    DOB: Jun 28, 1950, 66 y.o.   MRN: [id number]  HPI  She is chain smoking some. Not needing it during the day. She has started eye vitamins.  She is worried about the beginnings of dementia and wonders about starting a medication. Asked the same thing over and over. Her mother developed dementia in her 84s. cva as well in 72s, and died last year in the 90s  Last alprzolam rx was 0.5mg  #10 in 3/16.   Depression screen [facility] 2/9 10/03/2016 09/12/2016 08/31/2016 08/20/2016 08/13/2016  Decreased Interest 1 0 3 0 0  Down, Depressed, Hopeless 2 0 3 3 0  PHQ - 2 Score 3 0 6 3 0  Altered sleeping 3 - 3 - -  Tired, decreased energy 1 - 3 - -  Change in appetite 0 - 0 - -  Feeling bad or failure about yourself  0 - 3 - -  Trouble concentrating 3 - 3 - -  Moving slowly or fidgety/restless 2 - 0 - -  Suicidal thoughts 0 - 0 - -  PHQ-9 Score 12 - 18 - -  Difficult doing work/chores Very difficult - Extremely dIfficult - -  Some recent data might be hidden   Past Medical History:  Diagnosis Date  . ACUTE DUODEN ULCER W/HEMORR PERF&OBSTRUCTION 06/26/2004   Qualifier: Diagnosis of  By: [name] MD, [name]   . Acute duodenal ulcer with hemorrhage and perforation, with obstruction ([place])   . Allergy   . Anemia   . Anginal pain ([place])    "many many years ago"  . Anxiety    sees [name] NP at Dr. Radonna Ricker office  . Arthritis    "hips" (09/06/2014)  . Asthma   . Barrett's esophagus   . Chronic abdominal pain    narcotic dependence, [facility]dak [facility]  . 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 Complication of anesthesia    pt states had too much xanax on board - agitated   . COPD (chronic obstructive pulmonary disease) ([place])   . Depression   . Exertional shortness of breath   . FEVER, RECURRENT 08/11/2009   Qualifier: Diagnosis of  By: [name] MD, [name]   . Fx of fibula 07-28-11   left fibula, 2 places    . Fx two ribs-open 08-24-11   left 4th and 5th  . GASTRIC OUTLET OBSTRUCTION 08/28/2007   In July 2008 she underwent laparoscopic enterolysis, Nissen fundoplication over a #32 bougie, single pledgeted suture colosure of the hiatus and a 3 suture wrap.  Lap truncal vagotomy and a loop gastrojejunostomy was performed.  This was revised in August of 2009 to a roux en Y gastrojejujostomy.     . Gastroparesis 06/03/2007   Qualifier: Diagnosis of  By: [name] MD, [name]   . GERD (gastroesophageal reflux disease)   . Headache    "monthly" (09/06/2014)  . History of blood transfusion    "related to OR; maybe when I perforated that ulcer"  . History of hiatal hernia   . Lap Nissen + truncal vagotomy July 2008 05/13/2013  . LEG EDEMA 01/19/2008   Qualifier: Diagnosis of  By: [name] MD, [name]   . Menopause   . MENOPAUSE 01/07/2007   Qualifier: Diagnosis of  By: [name], CMA, [place]    . Myocardial infarction ([place])   . Narcotic abuse   . Peptic ulcer disease  dr Oletta Lamas  . Pneumonia, organism unspecified(486) 11/07/2010   Assoc with R Parapneumonic effusion 09/2010    - Tapped 10/05/10    - CxR resolved 11/07/2010    . S/P jejunostomy Feb 2016 07/26/2014   Past Surgical History:  Procedure Laterality Date  . BIOPSY THYROID  08-13-11   benign nodule, per Dr. Melida Quitter   . CATARACT EXTRACTION W/ INTRAOCULAR LENS  IMPLANT, BILATERAL Bilateral   . COLONOSCOPY    . DILATION AND CURETTAGE OF UTERUS    . ESOPHAGOGASTRODUODENOSCOPY  12-29-09   dr qadeer at [facility], several gastric ulcers  . ESOPHAGOGASTRODUODENOSCOPY N/A 09/25/2012   Procedure: ESOPHAGOGASTRODUODENOSCOPY (EGD);  Surgeon: Beryle Beams, MD;  Location: [facility];  Service: Endoscopy;  Laterality: N/A;  . FRACTURE SURGERY    . GASTROJEJUNOSTOMY  02-20-08   Roux en Y gastrojejunsotomy w 1 foot Roux limb  . GASTROSTOMY N/A 07/26/2014   Procedure: LAPRASCOPIC ASSISTED OPEN PLACEMENT OF JEJUNOSTOMY TUBE;  Surgeon: Pedro Earls, MD;  Location:  [facility];  Service: General;  Laterality: N/A;  . HERNIA REPAIR     "hiatal"  . JOINT REPLACEMENT    . LAPAROSCOPIC NISSEN FUNDOPLICATION    . LAPAROSCOPIC PARTIAL GASTRECTOMY N/A 06/29/2013   Procedure: Gastrectomy and GJ Tube placement;  Surgeon: Pedro Earls, MD;  [facility];  Service: General;  Laterality: N/A;  . ORIF HIP FRACTURE Bilateral    "pins in both of them"  . REPAIR OF PERFORATED ULCER    . TONSILLECTOMY     Current Outpatient Prescriptions on File Prior to Visit  Medication Sig Dispense Refill  . albuterol (PROVENTIL HFA;VENTOLIN HFA) 108 (90 Base) MCG/ACT inhaler Inhale 2 puffs into the lungs every 4 (four) hours as needed (cough, shortness of breath or wheezing.). 1 Inhaler 3  . DULoxetine (CYMBALTA) 60 MG capsule TAKE (1) CAPSULE DAILY. 30 capsule 0  . furosemide (LASIX) 20 MG tablet TAKE 1 TABLET ONCE DAILY AS NEEDED FOR EDEMA. 30 tablet 0  . hydrOXYzine (VISTARIL) 50 MG capsule TAKE 2 CAPSULES AT BEDTIME AS NEEDED FOR SLEEP. 60 capsule 0  . ipratropium (ATROVENT) 0.03 % nasal spray Place 2 sprays into the nose 4 (four) times daily. 30 mL 1  . ipratropium-albuterol (DUONEB) 0.5-2.5 (3) MG/3ML SOLN Take 3 mLs by nebulization 4 (four) times daily -  before meals and at bedtime. 360 mL 3  . OLANZapine (ZYPREXA) 20 MG tablet Take 1 tablet (20 mg total) by mouth at bedtime. 30 tablet 0  . omeprazole (PRILOSEC) 40 MG capsule TAKE ONE CAPSULE BY MOUTH TWICE DAILY 180 capsule 3  . ondansetron (ZOFRAN ODT) 4 MG disintegrating tablet Take 1-2 tablets (4-8 mg total) by mouth every 8 (eight) hours as needed for nausea or vomiting. 90 tablet 11  . promethazine (PHENERGAN) 6.25 MG/5ML syrup TAKE 20 ML'S BY MOUTH EVERY 8 HOURS AS NEEDED FOR NAUSEA. 473 mL 0   No current facility-administered medications on file prior to visit.    Allergies  Allergen Reactions  . Lithium Nausea And Vomiting  . Celebrex [Celecoxib] Other (See Comments)    History of bleeding ulcers  .  Morphine Itching  . Quetiapine Other (See Comments)     tardive dyskinesia  . Varenicline Tartrate Other (See Comments)    hallucinations   Family History  Problem Relation Age of Onset  . Cancer Mother     kidney, magliant tumor on face  . Stroke Mother   . Celiac disease Father   .  Cancer Father     kidney and pancreatic   Social History   Social History  . Marital status: Divorced    Spouse name: N/A  . Number of children: N/A  . Years of education: N/A   Social History Main Topics  . Smoking status: Current Every Day Smoker    Packs/day: 2.00    Years: 50.00    Types: Cigarettes  . Smokeless tobacco: Never Used  . Alcohol use No  . Drug use: No  . Sexual activity: No   Other Topics Concern  . None   Social History Narrative  . None    Review of Systems See hpi    Objective:   Physical Exam  Constitutional: She is oriented to person, place, and time. She appears well-developed and well-nourished. No distress.  HENT:  Head: Normocephalic and atraumatic.  Right Ear: External ear normal.  Eyes: Conjunctivae are normal. No scleral icterus.  Pulmonary/Chest: Effort normal.  Neurological: She is alert and oriented to person, place, and time.  Skin: Skin is warm and dry. She is not diaphoretic. No erythema.  Psychiatric: She has a normal mood and affect. Her behavior is normal.      BP 120/73   Pulse 93   Temp 98.2 F (36.8 C) (Oral)   Resp 18   Ht 5\' 3"  (1.6 m)   Wt 100 lb 9.6 oz (45.6 kg)   SpO2 96%   BMI 17.82 kg/m      Assessment & Plan:   1. Memory loss - see MOCA scanned and in flow sheet. Suspect sxs are from med use but now pt and her roommate are complaining and refuse to taylor sedative/hypnotic meds so was going to start trial of aricept but is QT prolongation when combined with zofran (and hydroxyzine) which pt uses 8/d so will hold off on aricept trial at this itme. Offered pt neurology referral but I think it would be best for her to  see psych first - encouraged pt to call [facility] (where I referred her at our last visit) for an appt and pt agrees  2. Memory change   3. Insomnia, unspecified type - has failed everything that I can think of. Cont zyprexa. Referred to psych.  4. Chronic pain syndrome   5. High risk medication use      Meds ordered this encounter  Medications  . DISCONTD: donepezil (ARICEPT) 5 MG tablet    Sig: Take 1 tablet (5 mg total) by mouth at bedtime.    Dispense:  30 tablet    Refill:  0  . HYDROcodone-acetaminophen (NORCO) 10-325 MG tablet    Sig: Take 1-2 tablets by mouth every 6 (six) hours as needed. This is a 1 week supply. Ok to fill Saturday 10/26/16    Dispense:  60 tablet    Refill:  0  . HYDROcodone-acetaminophen (NORCO) 10-325 MG tablet    Sig: Take 1-2 tablets by mouth every 6 (six) hours as needed.    Dispense:  60 tablet    Refill:  0    May fill on or after saturday 10/19/2016  . HYDROcodone-acetaminophen (NORCO) 10-325 MG tablet    Sig: Take 1-2 tablets by mouth every 6 (six) hours as needed.    Dispense:  60 tablet    Refill:  0    May fill on or after Saturday 10/12/2016  . HYDROcodone-acetaminophen (NORCO) 10-325 MG tablet    Sig: Take 1-2 tablets by mouth every 6 (six)  hours as needed.    Dispense:  60 tablet    Refill:  0    May fill on or after Saturday 10/05/2016   Today I have utilized the [place] Controlled Substance Registry's online query to confirm compliance regarding the patient's narcotic pain medications. My review reveals appropriate prescription fills and that [facility] is the sole provider of these medications. Rechecks will occur regularly and the patient is aware of our use of the system.    [name], M.D.  Primary Care at [facility] [address] [place], [place] [address] [phone] phone [phone] fax  10/05/16 11:12 PM

## 2016-10-03 NOTE — Patient Instructions (Addendum)
I recommend making an appointment at the neuropsychiatric Care center to see if they can find a medicine to help you sleep.    IF you received an x-ray today, you will receive an invoice from [facility] Radiology. Please contact [facility] Radiology at [phone] with questions or concerns regarding your invoice.   IF you received labwork today, you will receive an invoice from [facility]. Please contact [facility] at [phone] with questions or concerns regarding your invoice.   Our billing staff will not be able to assist you with questions regarding bills from these companies.  You will be contacted with the lab results as soon as they are available. The fastest way to get your results is to activate your My Chart account. Instructions are located on the last page of this paperwork. If you have not heard from [place] regarding the results in 2 weeks, please contact this office.

## 2016-10-17 ENCOUNTER — Ambulatory Visit (INDEPENDENT_AMBULATORY_CARE_PROVIDER_SITE_OTHER): Payer: Medicare Other | Admitting: Family Medicine

## 2016-10-17 ENCOUNTER — Encounter: Payer: Self-pay | Admitting: Family Medicine

## 2016-10-17 VITALS — BP 106/67 | HR 84 | Temp 97.9°F | Resp 18 | Ht 63.0 in | Wt 103.0 lb

## 2016-10-17 DIAGNOSIS — G4700 Insomnia, unspecified: Secondary | ICD-10-CM

## 2016-10-17 DIAGNOSIS — F411 Generalized anxiety disorder: Secondary | ICD-10-CM | POA: Diagnosis not present

## 2016-10-17 DIAGNOSIS — G894 Chronic pain syndrome: Secondary | ICD-10-CM | POA: Diagnosis not present

## 2016-10-17 DIAGNOSIS — F17200 Nicotine dependence, unspecified, uncomplicated: Secondary | ICD-10-CM

## 2016-10-17 DIAGNOSIS — H543 Unqualified visual loss, both eyes: Secondary | ICD-10-CM | POA: Diagnosis not present

## 2016-10-17 DIAGNOSIS — R636 Underweight: Secondary | ICD-10-CM | POA: Diagnosis not present

## 2016-10-17 DIAGNOSIS — H3530 Unspecified macular degeneration: Secondary | ICD-10-CM | POA: Diagnosis not present

## 2016-10-17 DIAGNOSIS — J439 Emphysema, unspecified: Secondary | ICD-10-CM

## 2016-10-17 MED ORDER — RAMELTEON 8 MG PO TABS: 8.0000 mg | ORAL_TABLET | Freq: Every day | ORAL | 0 refills | Status: DC

## 2016-10-17 MED ORDER — HYDROXYZINE PAMOATE 50 MG PO CAPS: ORAL_CAPSULE | ORAL | 0 refills | Status: DC

## 2016-10-17 MED ORDER — PROMETHAZINE-CODEINE 6.25-10 MG/5ML PO SYRP: 10.0000 mL | ORAL_SOLUTION | Freq: Four times a day (QID) | ORAL | 1 refills | Status: DC | PRN

## 2016-10-17 MED ORDER — OLANZAPINE 20 MG PO TABS: 20.0000 mg | ORAL_TABLET | Freq: Every day | ORAL | 0 refills | Status: DC

## 2016-10-17 NOTE — Progress Notes (Signed)
Subjective:    Patient ID: [name], female    DOB: March 06, 1951, 66 y.o.   MRN: [id number] Chief Complaint  Patient presents with  . Medication Refill    all medications mostly     HPI She feels like she is a wreck. She only feels comfortable when she was taking xanax. Tried fof feeling like this, becomes tearful. Very frustrated about not being able to drive. Her roommate refuses to take her places at times.    She just paces up and down the hall.  Past Medical History:  Diagnosis Date  . ACUTE DUODEN ULCER W/HEMORR PERF&OBSTRUCTION 06/26/2004   Qualifier: Diagnosis of  By: [name] MD, [name]   . Acute duodenal ulcer with hemorrhage and perforation, with obstruction ([place])   . Allergy   . Anemia   . Anginal pain ([place])    "many many years ago"  . Anxiety    sees [name] NP at Dr. Radonna Ricker office  . Arthritis    "hips" (09/06/2014)  . Asthma   . Barrett's esophagus   . Chronic abdominal pain    narcotic dependence, dr gy[facility]eng-dak [facility]  . 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 Complication of anesthesia    pt states had too much xanax on board - agitated   . COPD (chronic obstructive pulmonary disease) ([place])   . Depression   . Exertional shortness of breath   . FEVER, RECURRENT 08/11/2009   Qualifier: Diagnosis of  By: [name] MD, [name]   . Fx of fibula 07-28-11   left fibula, 2 places   . Fx two ribs-open 08-24-11   left 4th and 5th  . GASTRIC OUTLET OBSTRUCTION 08/28/2007   In July 2008 she underwent laparoscopic enterolysis, Nissen fundoplication over a #62 bougie, single pledgeted suture colosure of the hiatus and a 3 suture wrap.  Lap truncal vagotomy and a loop gastrojejunostomy was performed.  This was revised in August of 2009 to a roux en Y gastrojejujostomy.     . Gastroparesis 06/03/2007   Qualifier: Diagnosis of  By: [name] MD, [name]   . GERD (gastroesophageal reflux disease)   . Headache    "monthly"  (09/06/2014)  . History of blood transfusion    "related to OR; maybe when I perforated that ulcer"  . History of hiatal hernia   . Lap Nissen + truncal vagotomy July 2008 05/13/2013  . LEG EDEMA 01/19/2008   Qualifier: Diagnosis of  By: [name] MD, [name]   . Menopause   . MENOPAUSE 01/07/2007   Qualifier: Diagnosis of  By: [name], CMA, [place]    . Myocardial infarction ([place])   . Narcotic abuse   . Peptic ulcer disease    dr Oletta Lamas  . Pneumonia, organism unspecified(486) 11/07/2010   Assoc with R Parapneumonic effusion 09/2010    - Tapped 10/05/10    - CxR resolved 11/07/2010    . S/P jejunostomy Feb 2016 07/26/2014   Past Surgical History:  Procedure Laterality Date  . BIOPSY THYROID  08-13-11   benign nodule, per Dr. Melida Quitter   . CATARACT EXTRACTION W/ INTRAOCULAR LENS  IMPLANT, BILATERAL Bilateral   . COLONOSCOPY    . DILATION AND CURETTAGE OF UTERUS    . ESOPHAGOGASTRODUODENOSCOPY  12-29-09   dr qadeer at [facility], several gastric ulcers  . ESOPHAGOGASTRODUODENOSCOPY N/A 09/25/2012   Procedure: ESOPHAGOGASTRODUODENOSCOPY (EGD);  Surgeon: Beryle Beams, MD;  Location: [facility];  Service: Endoscopy;  Laterality: N/A;  . FRACTURE SURGERY    . GASTROJEJUNOSTOMY  02-20-08   Roux en Y gastrojejunsotomy w 1 foot Roux limb  . GASTROSTOMY N/A 07/26/2014   Procedure: LAPRASCOPIC ASSISTED OPEN PLACEMENT OF JEJUNOSTOMY TUBE;  Surgeon: Pedro Earls, MD;  Location: [facility];  Service: General;  Laterality: N/A;  . HERNIA REPAIR     "hiatal"  . JOINT REPLACEMENT    . LAPAROSCOPIC NISSEN FUNDOPLICATION    . LAPAROSCOPIC PARTIAL GASTRECTOMY N/A 06/29/2013   Procedure: Gastrectomy and GJ Tube placement;  Surgeon: Pedro Earls, MD;  [facility];  Service: General;  Laterality: N/A;  . ORIF HIP FRACTURE Bilateral    "pins in both of them"  . REPAIR OF PERFORATED ULCER    . TONSILLECTOMY     Current Outpatient Prescriptions on File Prior to Visit  Medication Sig Dispense Refill  .  albuterol (PROVENTIL HFA;VENTOLIN HFA) 108 (90 Base) MCG/ACT inhaler Inhale 2 puffs into the lungs every 4 (four) hours as needed (cough, shortness of breath or wheezing.). 1 Inhaler 3  . DULoxetine (CYMBALTA) 60 MG capsule TAKE (1) CAPSULE DAILY. 30 capsule 0  . furosemide (LASIX) 20 MG tablet TAKE 1 TABLET ONCE DAILY AS NEEDED FOR EDEMA. 30 tablet 0  . HYDROcodone-acetaminophen (NORCO) 10-325 MG tablet Take 1-2 tablets by mouth every 6 (six) hours as needed. This is a 1 week supply. Ok to fill Saturday 10/26/16 60 tablet 0  . ipratropium (ATROVENT) 0.03 % nasal spray Place 2 sprays into the nose 4 (four) times daily. 30 mL 1  . ipratropium-albuterol (DUONEB) 0.5-2.5 (3) MG/3ML SOLN Take 3 mLs by nebulization 4 (four) times daily -  before meals and at bedtime. 360 mL 3  . omeprazole (PRILOSEC) 40 MG capsule TAKE ONE CAPSULE BY MOUTH TWICE DAILY 180 capsule 3  . ondansetron (ZOFRAN ODT) 4 MG disintegrating tablet Take 1-2 tablets (4-8 mg total) by mouth every 8 (eight) hours as needed for nausea or vomiting. 90 tablet 11  . promethazine (PHENERGAN) 6.25 MG/5ML syrup TAKE 20 ML'S BY MOUTH EVERY 8 HOURS AS NEEDED FOR NAUSEA. 473 mL 0   No current facility-administered medications on file prior to visit.    Allergies  Allergen Reactions  . Lithium Nausea And Vomiting  . Celebrex [Celecoxib] Other (See Comments)    History of bleeding ulcers  . Morphine Itching  . Quetiapine Other (See Comments)     tardive dyskinesia  . Varenicline Tartrate Other (See Comments)    hallucinations   Family History  Problem Relation Age of Onset  . Cancer Mother     kidney, magliant tumor on face  . Stroke Mother   . Celiac disease Father   . Cancer Father     kidney and pancreatic   Social History   Social History  . Marital status: Divorced    Spouse name: N/A  . Number of children: N/A  . Years of education: N/A   Social History Main Topics  . Smoking status: Current Every Day Smoker     Packs/day: 2.00    Years: 50.00    Types: Cigarettes  . Smokeless tobacco: Never Used  . Alcohol use No  . Drug use: No  . Sexual activity: No   Other Topics Concern  . None   Social History Narrative  . None   Depression screen [facility] 2/9 10/17/2016 10/03/2016 09/12/2016 08/31/2016 08/20/2016  Decreased Interest 0 1 0 3 0  Down, Depressed, Hopeless 0 2 0 3  3  PHQ - 2 Score 0 3 0 6 3  Altered sleeping - 3 - 3 -  Tired, decreased energy - 1 - 3 -  Change in appetite - 0 - 0 -  Feeling bad or failure about yourself  - 0 - 3 -  Trouble concentrating - 3 - 3 -  Moving slowly or fidgety/restless - 2 - 0 -  Suicidal thoughts - 0 - 0 -  PHQ-9 Score - 12 - 18 -  Difficult doing work/chores - Very difficult - Extremely dIfficult -  Some recent data might be hidden    Review of Systems see hpi Objective:   Physical Exam  Constitutional: She is oriented to person, place, and time. She appears well-developed and well-nourished. No distress.  HENT:  Head: Normocephalic and atraumatic.  Right Ear: External ear normal.  Eyes: Conjunctivae are normal. No scleral icterus.  Pulmonary/Chest: Effort normal.  Neurological: She is alert and oriented to person, place, and time.  Skin: Skin is warm and dry. She is not diaphoretic. No erythema.  Psychiatric: Her speech is normal. Her mood appears anxious. Cognition and memory are impaired. She expresses impulsivity.      BP 106/67   Pulse 84   Temp 97.9 F (36.6 C) (Oral)   Resp 18   Ht 5\' 3"  (1.6 m)   Wt 103 lb (46.7 kg)   SpO2 93%   BMI 18.25 kg/m  U    Assessment & Plan:  Going to be due for a refill 5/12. Wants to be due to [facility] asap.  1. Macular degeneration of both eyes, unspecified type   2. Chronic pain syndrome   3. Underweight   4. Blind in both eyes   5. Anxiety state   6. Insomnia, unspecified type   7. Tobacco use disorder   8. Pulmonary emphysema, unspecified emphysema type ([place])      Orders Placed This Encounter  Procedures  . Ambulatory referral to Psychiatry    Referral Priority:   Routine    Referral Type:   Psychiatric    Referral Reason:   Specialty Services Required    Requested Specialty:   Psychiatry    Number of Visits Requested:   1    Meds ordered this encounter  Medications  . hydrOXYzine (VISTARIL) 50 MG capsule    Sig: TAKE 2 CAPSULES AT BEDTIME AS NEEDED FOR SLEEP.    Dispense:  60 capsule    Refill:  0  . OLANZapine (ZYPREXA) 20 MG tablet    Sig: Take 1 tablet (20 mg total) by mouth at bedtime.    Dispense:  30 tablet    Refill:  0  . ramelteon (ROZEREM) 8 MG tablet    Sig: Take 1 tablet (8 mg total) by mouth at bedtime.    Dispense:  30 tablet    Refill:  0  . promethazine-codeine (PHENERGAN WITH CODEINE) 6.25-10 MG/5ML syrup    Sig: Take 10 mLs by mouth every 6 (six) hours as needed for cough.    Dispense:  240 mL    Refill:  1      [name], M.D.  Primary Care at [facility] [address] [place], [place] [address] [phone] phone [phone] fax  10/30/16 3:12 AM

## 2016-10-17 NOTE — Patient Instructions (Addendum)
[facility] Address: [address], [place], [place] [address] Phone: [phone]  [facility]      IF you received an x-ray today, you will receive an invoice from [facility] Radiology. Please contact [facility] Radiology at [phone] with questions or concerns regarding your invoice.   IF you received labwork today, you will receive an invoice from [facility]. Please contact [facility] at [phone] with questions or concerns regarding your invoice.   Our billing staff will not be able to assist you with questions regarding bills from these companies.  You will be contacted with the lab results as soon as they are available. The fastest way to get your results is to activate your My Chart account. Instructions are located on the last page of this paperwork. If you have not heard from [place] regarding the results in 2 weeks, please contact this office.    I recommend calling one of the below social workers through the [place] to see what services are available to use such as free transportation and audiobooks due to your severe vision loss from macular degeneration.  [place] Education officer, museum for the [employer]: [phone] Fax: [facility] [address] [place], [place] [address] Email: [email or web address] [place] [employer] Phone: [phone] Fax: [phone] [address] [place], [place] [address] Email: [email or web address]

## 2016-10-19 DIAGNOSIS — J449 Chronic obstructive pulmonary disease, unspecified: Secondary | ICD-10-CM | POA: Diagnosis not present

## 2016-10-25 ENCOUNTER — Ambulatory Visit: Payer: Medicare Other | Admitting: Family Medicine

## 2016-10-26 ENCOUNTER — Ambulatory Visit: Payer: Medicare Other | Admitting: Family Medicine

## 2016-10-28 ENCOUNTER — Ambulatory Visit: Payer: Medicare Other | Admitting: Family Medicine

## 2016-10-31 ENCOUNTER — Ambulatory Visit: Payer: Medicare Other | Admitting: Family Medicine

## 2016-11-01 ENCOUNTER — Encounter: Payer: Self-pay | Admitting: Family Medicine

## 2016-11-01 ENCOUNTER — Ambulatory Visit (INDEPENDENT_AMBULATORY_CARE_PROVIDER_SITE_OTHER): Payer: Medicare Other | Admitting: Family Medicine

## 2016-11-01 VITALS — BP 133/78 | HR 75 | Temp 98.1°F | Resp 16 | Ht 63.5 in | Wt 103.0 lb

## 2016-11-01 DIAGNOSIS — R413 Other amnesia: Secondary | ICD-10-CM

## 2016-11-01 DIAGNOSIS — H3530 Unspecified macular degeneration: Secondary | ICD-10-CM

## 2016-11-01 DIAGNOSIS — J449 Chronic obstructive pulmonary disease, unspecified: Secondary | ICD-10-CM

## 2016-11-01 DIAGNOSIS — G4700 Insomnia, unspecified: Secondary | ICD-10-CM

## 2016-11-01 DIAGNOSIS — G894 Chronic pain syndrome: Secondary | ICD-10-CM | POA: Diagnosis not present

## 2016-11-01 DIAGNOSIS — H548 Legal blindness, as defined in USA: Secondary | ICD-10-CM

## 2016-11-01 DIAGNOSIS — Z9114 Patient's other noncompliance with medication regimen: Secondary | ICD-10-CM

## 2016-11-01 DIAGNOSIS — F331 Major depressive disorder, recurrent, moderate: Secondary | ICD-10-CM

## 2016-11-01 MED ORDER — HYDROCODONE-ACETAMINOPHEN 10-325 MG PO TABS: 1.0000 | ORAL_TABLET | Freq: Four times a day (QID) | ORAL | 0 refills | Status: DC | PRN

## 2016-11-01 MED ORDER — GABAPENTIN 300 MG PO CAPS: 300.0000 mg | ORAL_CAPSULE | Freq: Every day | ORAL | 0 refills | Status: DC

## 2016-11-01 NOTE — Patient Instructions (Addendum)
We recommend that you schedule a mammogram for breast cancer screening. Typically, you do not need a referral to do this. Please contact a local imaging center to schedule your mammogram.  [facility] - [phone]  *ask for the Radiology Department [facility] ([place]) - [phone] or [phone]  [facility] - [phone] [place] [phone] [facility] - [phone]  *ask for the [facility] - [phone]  *ask for the Radiology Department [facility] - [phone]  *ask for the [facility] - [phone]    IF you received an x-ray today, you will receive an invoice from [facility]. Please contact [facility] at [phone] with questions or concerns regarding your invoice.   IF you received labwork today, you will receive an invoice from [place]. Please contact [facility] at [phone] with questions or concerns regarding your invoice.   Our billing staff will not be able to assist you with questions regarding bills from these companies.  You will be contacted with the lab results as soon as they are available. The fastest way to get your results is to activate your My Chart account. Instructions are located on the last page of this paperwork. If you have not heard from [place] regarding the results in 2 weeks, please contact this office.    I will be happy to write you a letter in support of the fact that your appliances are wore out and she needs new ones to have an adequate stove, clean clothes, refridgerating, washer/dryer.   Make sure your psychiatrist at [facility] sends me a copy of the records.  Insomnia Insomnia is a sleep disorder that makes it difficult to fall asleep or to stay asleep. Insomnia can cause tiredness (fatigue), low energy, difficulty concentrating, mood swings,  and poor performance at work or school. There are three different ways to classify insomnia:  Difficulty falling asleep.  Difficulty staying asleep.  Waking up too early in the morning. Any type of insomnia can be long-term (chronic) or short-term (acute). Both are common. Short-term insomnia usually lasts for three months or less. Chronic insomnia occurs at least three times a week for longer than three months. What are the causes? Insomnia may be caused by another condition, situation, or substance, such as:  Anxiety.  Certain medicines.  Gastroesophageal reflux disease (GERD) or other gastrointestinal conditions.  Asthma or other breathing conditions.  Restless legs syndrome, sleep apnea, or other sleep disorders.  Chronic pain.  Menopause. This may include hot flashes.  Stroke.  Abuse of alcohol, tobacco, or illegal drugs.  Depression.  Caffeine.  Neurological disorders, such as Alzheimer disease.  An overactive thyroid (hyperthyroidism). The cause of insomnia may not be known. What increases the risk? Risk factors for insomnia include:  Gender. Women are more commonly affected than men.  Age. Insomnia is more common as you get older.  Stress. This may involve your professional or personal life.  Income. Insomnia is more common in people with lower income.  Lack of exercise.  Irregular work schedule or night shifts.  Traveling between different time zones. What are the signs or symptoms? If you have insomnia, trouble falling asleep or trouble staying asleep is the main symptom. This may lead to other symptoms, such as:  Feeling fatigued.  Feeling nervous about going to sleep.  Not feeling rested in the morning.  Having trouble concentrating.  Feeling irritable,  anxious, or depressed. How is this treated? Treatment for insomnia depends on the cause. If your insomnia is caused by an underlying condition, treatment will focus on addressing the  condition. Treatment may also include:  Medicines to help you sleep.  Counseling or therapy.  Lifestyle adjustments. Follow these instructions at home:  Take medicines only as directed by your health care provider.  Keep regular sleeping and waking hours. Avoid naps.  Keep a sleep diary to help you and your health care provider figure out what could be causing your insomnia. Include:  When you sleep.  When you wake up during the night.  How well you sleep.  How rested you feel the next day.  Any side effects of medicines you are taking.  What you eat and drink.  Make your bedroom a comfortable place where it is easy to fall asleep:  Put up shades or special blackout curtains to block light from outside.  Use a white noise machine to block noise.  Keep the temperature cool.  Exercise regularly as directed by your health care provider. Avoid exercising right before bedtime.  Use relaxation techniques to manage stress. Ask your health care provider to suggest some techniques that may work well for you. These may include:  Breathing exercises.  Routines to release muscle tension.  Visualizing peaceful scenes.  Cut back on alcohol, caffeinated beverages, and cigarettes, especially close to bedtime. These can disrupt your sleep.  Do not overeat or eat spicy foods right before bedtime. This can lead to digestive discomfort that can make it hard for you to sleep.  Limit screen use before bedtime. This includes:  Watching TV.  Using your smartphone, tablet, and computer.  Stick to a routine. This can help you fall asleep faster. Try to do a quiet activity, brush your teeth, and go to bed at the same time each night.  Get out of bed if you are still awake after 15 minutes of trying to sleep. Keep the lights down, but try reading or doing a quiet activity. When you feel sleepy, go back to bed.  Make sure that you drive carefully. Avoid driving if you feel very  sleepy.  Keep all follow-up appointments as directed by your health care provider. This is important. Contact a health care provider if:  You are tired throughout the day or have trouble in your daily routine due to sleepiness.  You continue to have sleep problems or your sleep problems get worse. Get help right away if:  You have serious thoughts about hurting yourself or someone else. This information is not intended to replace advice given to you by your health care provider. Make sure you discuss any questions you have with your health care provider. Document Released: 06/07/2000 Document Revised: 11/10/2015 Document Reviewed: 03/11/2014 Elsevier Interactive Patient Education  2017 [employer].

## 2016-11-01 NOTE — Progress Notes (Addendum)
By signing my name below, I, [name], attest that this documentation has been prepared under the direction and in the presence of [name], MD.  Electronically Signed: [name], Medical Scribe. 11/01/16. 2:35 PM.  Subjective:    Patient ID: [name], female    DOB: [birth year], 66 y.o.   MRN: [id number]  HPI Chief Complaint  Patient presents with  . Medication Refill  . Fatigue    patient states that she feels dehydrated, loss of appetite    HPI Comments: WAHNETA [name] is a 66 y.o. female who presents to Primary Care at [facility] complaining of insomnia and medication refill. Her largest complaint is insomnia. We had her on hydroxyzine 150 mg QHS along with zyprexa 20 mg and started her on rozerem 8 mg. She also requested a referral to the [facility] so information was given. Encouraged pt to use [employer] and [employer] to get aid for her severe vision loss.  Anxiety: Pt has an appt next week with the [facility]. She has been having difficulty getting funding from her mom's trust fund. Pt's refrigerator, stove, dishwasher has gone bad, as well as the washer and dryer. Pt is having a difficult time cooking and washing her clothes leading to unsanitary conditions and to be more dependent on unhealthy and difficult to obtain pre-prepared/restaurant food. States she is at risk for injury from the faulty/bad wiring and has been told that these appliances are to old to repair. She does not have the finances to buy new appliances without getting access to her inheritance/estate for this so is requesting that I write a letter on her behalf explaining that having adequate appliances is essential to her health.  Depression screen [facility] 2/9 11/01/2016 10/17/2016 10/03/2016 09/12/2016 08/31/2016  Decreased Interest 3 0 1 0 3  Down, Depressed, Hopeless 3 0 2 0 3  PHQ - 2 Score 6 0 3 0 6  Altered sleeping 3 - 3 - 3  Tired, decreased energy 3 - 1  - 3  Change in appetite 2 - 0 - 0  Feeling bad or failure about yourself  0 - 0 - 3  Trouble concentrating 2 - 3 - 3  Moving slowly or fidgety/restless 3 - 2 - 0  Suicidal thoughts 0 - 0 - 0  PHQ-9 Score 19 - 12 - 18  Difficult doing work/chores Extremely dIfficult - Very difficult - Extremely dIfficult  Some recent data might be hidden   Insomnia: Pt states the rozerem didn't give relief of her insomnia.  Patient Active Problem List   Diagnosis Date Noted  . COPD exacerbation ([place]) 06/22/2016  . Abdominal pain   . Hypoxia 04/08/2015  . Opioid use with withdrawal ([place])   . Protein-calorie malnutrition ([place])   . Episode of recurrent major depressive disorder ([place])   . Secondary hyperparathyroidism ([place]) 01/10/2015  . Vitamin D deficiency 01/10/2015  . 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 Anemia, iron deficiency 11/03/2014  . Chronic pain syndrome 10/24/2014  . Osteoporosis 10/05/2014  . Vertebral compression fracture ([place]) 10/05/2014  . Coronary artery disease due to calcified coronary lesion 10/05/2014  . Weight loss   . Tobacco abuse   . History of subtotal gastrectomy with Roux-en-Y recontruction   . Orthostatic hypotension 08/11/2014  . Severe protein-calorie malnutrition ([place]) 12/30/2013  . Constipation, chronic 07/10/2013  . Chronic abdominal pain   . COPD (chronic obstructive pulmonary disease) ([place]) 11/07/2010  . Major depressive  disorder, recurrent episode, moderate ([place]) 06/06/2010  . 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 Anxiety state 01/07/2007  . Barrett's esophagus 07/03/2005   Past Medical History:  Diagnosis Date  . ACUTE DUODEN ULCER W/HEMORR PERF&OBSTRUCTION 06/26/2004   Qualifier: Diagnosis of  By: [name] MD, [name]   . Acute duodenal ulcer with hemorrhage and perforation, with obstruction ([place])   . Allergy   . Anemia   . Anginal pain ([place])    "many many years ago"  . Anxiety    sees [name] NP at Dr. Radonna Ricker office  . Arthritis     "hips" (09/06/2014)  . Asthma   . Barrett's esophagus   . Chronic abdominal pain    narcotic dependence, dr gyarteng-dak at [facility]  . 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 Complication of anesthesia    pt states had too much xanax on board - agitated   . COPD (chronic obstructive pulmonary disease) ([place])   . Depression   . Exertional shortness of breath   . FEVER, RECURRENT 08/11/2009   Qualifier: Diagnosis of  By: [name] MD, [name]   . Fx of fibula 07-28-11   left fibula, 2 places   . Fx two ribs-open 08-24-11   left 4th and 5th  . GASTRIC OUTLET OBSTRUCTION 08/28/2007   In July 2008 she underwent laparoscopic enterolysis, Nissen fundoplication over a #69 bougie, single pledgeted suture colosure of the hiatus and a 3 suture wrap.  Lap truncal vagotomy and a loop gastrojejunostomy was performed.  This was revised in August of 2009 to a roux en Y gastrojejujostomy.     . Gastroparesis 06/03/2007   Qualifier: Diagnosis of  By: [name] MD, [name]   . GERD (gastroesophageal reflux disease)   . Headache    "monthly" (09/06/2014)  . History of blood transfusion    "related to OR; maybe when I perforated that ulcer"  . History of hiatal hernia   . Lap Nissen + truncal vagotomy July 2008 05/13/2013  . LEG EDEMA 01/19/2008   Qualifier: Diagnosis of  By: [name] MD, [name]   . Menopause   . MENOPAUSE 01/07/2007   Qualifier: Diagnosis of  By: [name], CMA, [place]    . Myocardial infarction ([place])   . Narcotic abuse   . Peptic ulcer disease    dr Oletta Lamas  . Pneumonia, organism unspecified(486) 11/07/2010   Assoc with R Parapneumonic effusion 09/2010    - Tapped 10/05/10    - CxR resolved 11/07/2010    . S/P jejunostomy Feb 2016 07/26/2014   Past Surgical History:  Procedure Laterality Date  . BIOPSY THYROID  08-13-11   benign nodule, per Dr. Melida Quitter   . CATARACT EXTRACTION W/ INTRAOCULAR LENS  IMPLANT, BILATERAL Bilateral   . COLONOSCOPY    . DILATION AND  CURETTAGE OF UTERUS    . ESOPHAGOGASTRODUODENOSCOPY  12-29-09   dr qadeer at [facility], several gastric ulcers  . ESOPHAGOGASTRODUODENOSCOPY N/A 09/25/2012   Procedure: ESOPHAGOGASTRODUODENOSCOPY (EGD);  Surgeon: Beryle Beams, MD;  Location: [facility];  Service: Endoscopy;  Laterality: N/A;  . FRACTURE SURGERY    . GASTROJEJUNOSTOMY  02-20-08   Roux en Y gastrojejunsotomy w 1 foot Roux limb  . GASTROSTOMY N/A 07/26/2014   Procedure: LAPRASCOPIC ASSISTED OPEN PLACEMENT OF JEJUNOSTOMY TUBE;  Surgeon: Pedro Earls, MD;  Location: [facility];  Service: General;  Laterality: N/A;  . HERNIA REPAIR     "hiatal"  . JOINT REPLACEMENT    .  LAPAROSCOPIC NISSEN FUNDOPLICATION    . LAPAROSCOPIC PARTIAL GASTRECTOMY N/A 06/29/2013   Procedure: Gastrectomy and GJ Tube placement;  Surgeon: Pedro Earls, MD;  Location: [facility];  Service: General;  Laterality: N/A;  . ORIF HIP FRACTURE Bilateral    "pins in both of them"  . REPAIR OF PERFORATED ULCER    . TONSILLECTOMY     Allergies  Allergen Reactions  . Lithium Nausea And Vomiting  . Celebrex [Celecoxib] Other (See Comments)    History of bleeding ulcers  . Morphine Itching  . Quetiapine Other (See Comments)     tardive dyskinesia  . Varenicline Tartrate Other (See Comments)    hallucinations   Prior to Admission medications   Medication Sig Start Date End Date Taking? Authorizing Provider  albuterol (PROVENTIL HFA;VENTOLIN HFA) 108 (90 Base) MCG/ACT inhaler Inhale 2 puffs into the lungs every 4 (four) hours as needed (cough, shortness of breath or wheezing.). 08/01/15  Yes Shawnee Knapp, MD  DULoxetine (CYMBALTA) 60 MG capsule TAKE (1) CAPSULE DAILY. 09/12/16  Yes Shawnee Knapp, MD  HYDROcodone-acetaminophen [facility]) 10-325 MG tablet Take 1-2 tablets by mouth every 6 (six) hours as needed. This is a 1 week supply. Ok to fill Saturday 11/02/16 11/01/16  Yes Shawnee Knapp, MD  hydrOXYzine (VISTARIL) 50 MG capsule TAKE 2 CAPSULES AT BEDTIME AS NEEDED FOR SLEEP.  10/17/16  Yes Shawnee Knapp, MD  ipratropium-albuterol (DUONEB) 0.5-2.5 (3) MG/3ML SOLN Take 3 mLs by nebulization 4 (four) times daily -  before meals and at bedtime. 06/29/16  Yes Shawnee Knapp, MD  OLANZapine (ZYPREXA) 20 MG tablet Take 1 tablet (20 mg total) by mouth at bedtime. 10/17/16  Yes Shawnee Knapp, MD  omeprazole (PRILOSEC) 40 MG capsule TAKE ONE CAPSULE BY MOUTH TWICE DAILY 10/10/15  Yes Shawnee Knapp, MD  ondansetron (ZOFRAN ODT) 4 MG disintegrating tablet Take 1-2 tablets (4-8 mg total) by mouth every 8 (eight) hours as needed for nausea or vomiting. 09/12/16  Yes Shawnee Knapp, MD  promethazine-codeine [facility] WITH CODEINE) 6.25-10 MG/5ML syrup Take 10 mLs by mouth every 6 (six) hours as needed for cough. 10/17/16  Yes Shawnee Knapp, MD  furosemide (LASIX) 20 MG tablet TAKE 1 TABLET ONCE DAILY AS NEEDED FOR EDEMA. Patient not taking: Reported on 11/01/2016 08/30/16   Shawnee Knapp, MD  HYDROcodone-acetaminophen [facility]) 10-325 MG tablet Take 1-2 tablets by mouth every 6 (six) hours as needed. This is a 1 wk supply. May fill on or after Sat 11/09/2016 11/01/16   Shawnee Knapp, MD  HYDROcodone-acetaminophen [facility]) 10-325 MG tablet Take 1-2 tablets by mouth every 6 (six) hours as needed. This is a 1 week supply. May fill on or after Sat 5/26. 11/01/16   Shawnee Knapp, MD  HYDROcodone-acetaminophen [place] [facility]) 10-325 MG tablet Take 1-2 tablets by mouth every 6 (six) hours as needed. This is a 1 week supply. May fill on or after Sat 11/23/2016. 11/01/16   Shawnee Knapp, MD  ipratropium (ATROVENT) 0.03 % nasal spray Place 2 sprays into the nose 4 (four) times daily. Patient not taking: Reported on 11/01/2016 08/03/16   Shawnee Knapp, MD  promethazine (PHENERGAN) 6.25 MG/5ML syrup TAKE 20 ML'S BY MOUTH EVERY 8 HOURS AS NEEDED FOR NAUSEA. Patient not taking: Reported on 11/01/2016 09/30/16   Shawnee Knapp, MD  ramelteon (ROZEREM) 8 MG tablet Take 1 tablet (8 mg total) by mouth at bedtime. Patient not taking: Reported on 11/01/2016  10/17/16   Shawnee Knapp, MD   Social History   Social History  . Marital status: Divorced    Spouse name: N/A  . Number of children: N/A  . Years of education: N/A   Occupational History  . Not on file.   Social History Main Topics  . Smoking status: Current Every Day Smoker    Packs/day: 2.00    Years: 50.00    Types: Cigarettes  . Smokeless tobacco: Never Used  . Alcohol use No  . Drug use: No  . Sexual activity: No   Other Topics Concern  . Not on file   Social History Narrative  . No narrative on file   Review of Systems  Psychiatric/Behavioral: Positive for sleep disturbance. The patient is nervous/anxious.    Objective:  Physical Exam  Constitutional: She appears well-developed and well-nourished. No distress.  HENT:  Head: Normocephalic and atraumatic.  Eyes: Conjunctivae are normal.  Neck: Neck supple.  Cardiovascular: Normal rate and regular rhythm.  Exam reveals no gallop and no friction rub.   No murmur heard. Pulmonary/Chest: Effort normal and breath sounds normal. No respiratory distress. She has no wheezes. She has no rales.  Neurological: She is alert.  Skin: Skin is warm and dry.  Psychiatric: She has a normal mood and affect. Her behavior is normal.  Nursing note and vitals reviewed.   Vitals:   11/01/16 1416  BP: 133/78  Pulse: 75  Resp: 16  Temp: 98.1 F (36.7 C)  TempSrc: Oral  SpO2: 97%  Weight: 103 lb (46.7 kg)  Height: 5' 3.5" (1.613 m)  Body mass index is 17.96 kg/m. Assessment & Plan:   1. Insomnia, unspecified type - has failed numerous things inc mirtazapine, trazodone, zanaflex/flexeril, belsomra, sonata, rozerem, ambient, mult atypical antipsychotics inc seroquel.  "nothing works as well as xanax - it was a Comptroller. Worked beautifully".  Of note when patient was on Xanax she would continue to complain of insomnia and anxiety only slightly less more than she currently does. Fortunately my unwillingness to resume her Xanax has  finally prompted patient to schedule an appointment with a psychiatrist, something I have been encouraging her to do for years. She has her intake appointment at [facility] where she was seen previously within the next 1-2 weeks. Requested she have her records there sent to me.  Continue hydroxyzine 100 mg daily at bedtime, Zyprexa 20 mg daily at bedtime, and start trial of gabapentin 300 mg daily at bedtime. Can increase to 600 mg if ineffective.   2. Chronic pain syndrome - refilled hydrocodone 10mg  - on #60/wk given in wkly rxs since pt is very poor at dose compliance.  Does not feel that this fully treats her pain so hopefully the gabapentin will help. Continue Cymbalta (pt states she is compliant but should have ran out of her medication a month ago)  3.      Poor compliance with medication - I am only refilling meds when requested by pt (or through her pharmacy) to try to gauge whether she is actually using any meds other than her hydrocodone (pt states she is compliant with cymbalta, zyprexa, ppi but always has a TON of these left - like several months extra - when I have her bring in her medication bottles for my review.) Again encouraged pt to get a multi-times a day pill box and USE. Was hoping pt could get home health aide to help with this, or at least with the  pill box fills/monitoring - wrote letter to see if she could get funds for this from her estate/inheritance but hasn't happened yet.  Tried home health - [facility] - but pt only qualified for a mo in 04/2015. 4.      Early onset macular degeneration - has refused to f/u w/ optho in sev years as she says there is nothing that can be done 5.      Legally blind - should NOT drive, is dependent upon her roommate for transportation (though technically pt still has a license and car I think) 6.      Memory loss, short term - assume due to large quantities of narcotics she is dependent upon 7.      COPD - has oxygen at home prn  and wears o/n or when ill 8.      Major depressive disorder, recurrent episode, moderate 9.      Debility - today she requests a letter from me supporting her need to have access to funds to pay for new appliances inc stackable washing machine/dryer, refrigerator, stove, and dishwasher as currently they are malfunctioning to the point where they are a physical danger (such as bad wiring) or providing unsanitary results which are worsening her medical problems. For instance, pt has trouble storing and preparing fresh health foods due to the fridge/stove condition which leads her to be dependent on pre-prepared foods and or restaurant take-out which is much worse for her due to notoriously high salt/fat content. Also the dishwasher and washing machine produce unsanitary results which can flair up her chronic abdominal pain and  COPD respectively. I will be willing to write a letter stating this to see if she can have access to her inheritance to cover where appliance expenses.    Meds ordered this encounter  Medications  . HYDROcodone-acetaminophen ([place]) 10-325 MG tablet    Sig: Take 1-2 tablets by mouth every 6 (six) hours as needed. This is a 1 wk supply. May fill on or after Sat 11/09/2016    Dispense:  60 tablet    Refill:  0  . HYDROcodone-acetaminophen (NORCO) 10-325 MG tablet    Sig: Take 1-2 tablets by mouth every 6 (six) hours as needed. This is a 1 week supply. May fill on or after Sat 5/26.    Dispense:  60 tablet    Refill:  0  . HYDROcodone-acetaminophen (NORCO) 10-325 MG tablet    Sig: Take 1-2 tablets by mouth every 6 (six) hours as needed. This is a 1 week supply. May fill on or after Sat 11/23/2016.    Dispense:  60 tablet    Refill:  0  . HYDROcodone-acetaminophen (NORCO) 10-325 MG tablet    Sig: Take 1-2 tablets by mouth every 6 (six) hours as needed. This is a 1 week supply. Ok to fill Saturday 11/02/16    Dispense:  60 tablet    Refill:  0  . gabapentin (NEURONTIN) 300 MG  capsule    Sig: Take 1-2 capsules (300-600 mg total) by mouth at bedtime.    Dispense:  60 capsule    Refill:  0    I personally performed the services described in this documentation, which was scribed in my presence. The recorded information has been reviewed and considered, and addended by me as needed.   [name], M.D.  Primary Care at [facility] [address] [place], [place] [address] [phone] phone [phone] fax  11/01/16 2:59 PM  Please  fax note over to [facility] as a FYI. Patient has intake appointment there within the next several weeks.

## 2016-11-10 ENCOUNTER — Other Ambulatory Visit: Payer: Self-pay | Admitting: Family Medicine

## 2016-11-18 DIAGNOSIS — J449 Chronic obstructive pulmonary disease, unspecified: Secondary | ICD-10-CM | POA: Diagnosis not present

## 2016-11-20 DIAGNOSIS — H3530 Unspecified macular degeneration: Secondary | ICD-10-CM | POA: Insufficient documentation

## 2016-11-20 DIAGNOSIS — Z9114 Patient's other noncompliance with medication regimen: Secondary | ICD-10-CM | POA: Insufficient documentation

## 2016-11-20 DIAGNOSIS — G4700 Insomnia, unspecified: Secondary | ICD-10-CM | POA: Insufficient documentation

## 2016-11-20 DIAGNOSIS — H548 Legal blindness, as defined in USA: Secondary | ICD-10-CM | POA: Insufficient documentation

## 2016-11-25 ENCOUNTER — Ambulatory Visit (INDEPENDENT_AMBULATORY_CARE_PROVIDER_SITE_OTHER): Payer: Medicare Other | Admitting: Family Medicine

## 2016-11-25 ENCOUNTER — Encounter: Payer: Self-pay | Admitting: Family Medicine

## 2016-11-25 VITALS — BP 113/73 | HR 78 | Resp 18 | Ht 63.5 in | Wt 105.4 lb

## 2016-11-25 DIAGNOSIS — R110 Nausea: Secondary | ICD-10-CM | POA: Diagnosis not present

## 2016-11-25 DIAGNOSIS — L981 Factitial dermatitis: Secondary | ICD-10-CM

## 2016-11-25 DIAGNOSIS — J441 Chronic obstructive pulmonary disease with (acute) exacerbation: Secondary | ICD-10-CM | POA: Diagnosis not present

## 2016-11-25 DIAGNOSIS — F424 Excoriation (skin-picking) disorder: Secondary | ICD-10-CM

## 2016-11-25 LAB — POCT URINALYSIS DIP (MANUAL ENTRY)
BILIRUBIN UA: NEGATIVE
BILIRUBIN UA: NEGATIVE mg/dL
GLUCOSE UA: NEGATIVE mg/dL
Leukocytes, UA: NEGATIVE
Nitrite, UA: NEGATIVE
Protein Ur, POC: NEGATIVE mg/dL
RBC UA: NEGATIVE
SPEC GRAV UA: 1.01 (ref 1.010–1.025)
Urobilinogen, UA: 1 E.U./dL
pH, UA: 7 (ref 5.0–8.0)

## 2016-11-25 MED ORDER — ALBUTEROL SULFATE (2.5 MG/3ML) 0.083% IN NEBU
2.5000 mg | INHALATION_SOLUTION | Freq: Once | RESPIRATORY_TRACT | Status: AC
  Administered 2016-11-25: 2.5 mg via RESPIRATORY_TRACT

## 2016-11-25 MED ORDER — IPRATROPIUM BROMIDE 0.02 % IN SOLN
0.5000 mg | Freq: Once | RESPIRATORY_TRACT | Status: AC
  Administered 2016-11-25: 0.5 mg via RESPIRATORY_TRACT

## 2016-11-25 MED ORDER — ONDANSETRON 4 MG PO TBDP: 4.0000 mg | ORAL_TABLET | Freq: Three times a day (TID) | ORAL | 11 refills | Status: DC | PRN

## 2016-11-25 NOTE — Patient Instructions (Signed)
     IF you received an x-ray today, you will receive an invoice from [facility]. Please contact  Radiology at [phone] with questions or concerns regarding your invoice.   IF you received labwork today, you will receive an invoice from [facility]. Please contact [facility] at [phone] with questions or concerns regarding your invoice.   Our billing staff will not be able to assist you with questions regarding bills from these companies.  You will be contacted with the lab results as soon as they are available. The fastest way to get your results is to activate your My Chart account. Instructions are located on the last page of this paperwork. If you have not heard from us regarding the results in 2 weeks, please contact this office.     

## 2016-11-25 NOTE — Progress Notes (Signed)
   Subjective:    Patient ID: [name], female    DOB: Oct 23, 1950, 66 y.o.   MRN: [id number] Chief Complaint  Patient presents with  . Follow-up    per patient, has started buspar and suboxone; has had nausea    HPI  Her psychiatrist Dr. Owens Shark once started her on buspar 30mg  bid which doesn't work at all and then started her on suboxone strips and started her on something for sleep but pt reports that it doesn't work at all. \ Sometimes won't fall asleep until 4-5 a.m. And then once she does she is completely out and sleeps like the dead.  Lungs getting worse recently with more cough and is wearing oxygen at home. Dry hacking cough.  Smoking still but has cut down - she think she has been kept it down to about 1/2 ppd.  Has promethazine liquid enough for this month but is running low is on zofran.  Appetite decreased some but is eating at times.   Bowels normal with coffee and urine fine.   Review of Systems     Objective:   Physical Exam    BP 113/73 (BP Location: Right Arm, Patient Position: Sitting, Cuff Size: Normal)   Pulse 78   Resp 18   Ht 5' 3.5" (1.613 m)   Wt 105 lb 6.4 oz (47.8 kg)   SpO2 93%   BMI 18.38 kg/m      Assessment & Plan:   1. COPD exacerbation ([place])   2. Nausea without vomiting   3. Compulsive skin picking     Orders Placed This Encounter  Procedures  . POCT urinalysis dipstick    Meds ordered this encounter  Medications  . busPIRone (BUSPAR) 10 MG tablet    Sig: Take 10 mg by mouth 2 (two) times daily.  . Oxcarbazepine (TRILEPTAL) 300 MG tablet    Sig: Take 300 mg by mouth at bedtime.  Marland Kitchen oxcarbazepine (TRILEPTAL) 600 MG tablet    Sig: Take 600 mg by mouth daily.  . SUBOXONE 8-2 MG FILM    Sig: Take 1 Film by mouth 2 (two) times daily.  . ondansetron (ZOFRAN ODT) 4 MG disintegrating tablet    Sig: Take 1-2 tablets (4-8 mg total) by mouth every 8 (eight) hours as needed for nausea or vomiting.    Dispense:  90 tablet    Refill:   11  . albuterol (PROVENTIL) (2.5 MG/3ML) 0.083% nebulizer solution 2.5 mg  . ipratropium (ATROVENT) nebulizer solution 0.5 mg      [name], M.D.  Primary Care at [facility] [address] [place], [place] [address] [phone] phone [phone] fax  11/27/16 11:53 PM

## 2016-12-14 ENCOUNTER — Other Ambulatory Visit: Payer: Self-pay | Admitting: Family Medicine

## 2016-12-14 NOTE — Telephone Encounter (Signed)
Pt should try to get this med from her provider at [facility]. I'm worried her meds are being changed and might interact if we don't know what the other one is doing.

## 2016-12-16 NOTE — Telephone Encounter (Signed)
Left detailed message to follow up with providers at [facility].

## 2016-12-19 DIAGNOSIS — J449 Chronic obstructive pulmonary disease, unspecified: Secondary | ICD-10-CM | POA: Diagnosis not present

## 2016-12-27 ENCOUNTER — Encounter (HOSPITAL_COMMUNITY): Payer: Self-pay | Admitting: Emergency Medicine

## 2016-12-27 ENCOUNTER — Emergency Department (HOSPITAL_COMMUNITY)
Admission: EM | Admit: 2016-12-27 | Discharge: 2016-12-27 | Disposition: A | Discharge status: Home / Self Care | Payer: Medicare Other | Attending: Emergency Medicine | Admitting: Emergency Medicine

## 2016-12-27 DIAGNOSIS — F17210 Nicotine dependence, cigarettes, uncomplicated: Secondary | ICD-10-CM | POA: Insufficient documentation

## 2016-12-27 DIAGNOSIS — J45909 Unspecified asthma, uncomplicated: Secondary | ICD-10-CM | POA: Insufficient documentation

## 2016-12-27 DIAGNOSIS — R5383 Other fatigue: Secondary | ICD-10-CM | POA: Insufficient documentation

## 2016-12-27 DIAGNOSIS — R404 Transient alteration of awareness: Secondary | ICD-10-CM | POA: Diagnosis not present

## 2016-12-27 DIAGNOSIS — R400 Somnolence: Secondary | ICD-10-CM

## 2016-12-27 DIAGNOSIS — F419 Anxiety disorder, unspecified: Secondary | ICD-10-CM | POA: Diagnosis not present

## 2016-12-27 DIAGNOSIS — G479 Sleep disorder, unspecified: Secondary | ICD-10-CM | POA: Diagnosis not present

## 2016-12-27 DIAGNOSIS — R531 Weakness: Secondary | ICD-10-CM | POA: Diagnosis not present

## 2016-12-27 NOTE — ED Provider Notes (Signed)
[place] [facility] DEPT Provider Note   CSN: [id number] Arrival date & time: 12/27/16  0816     History   Chief Complaint Chief Complaint  Patient presents with  . Fatigue    HPI [name] is a 66 y.o. female.Patient brought by EMS after her roommate insisted that she come to the hospital. Patient reports she wanted to stay in bed and watch TV this morning, as she was somewhat sleepy. She reports that her roommate felt something was wrong with her and insisted she come to the hospital by EMS. Patient now completely asymptomatic. No treatment prior to coming here no other associated symptoms.  HPI  Past Medical History:  Diagnosis Date  . ACUTE DUODEN ULCER W/HEMORR PERF&OBSTRUCTION 06/26/2004   Qualifier: Diagnosis of  By: [name] MD, [name]   . Acute duodenal ulcer with hemorrhage and perforation, with obstruction ([place])   . Allergy   . Anemia   . Anginal pain ([place])    "many many years ago"  . Anxiety    sees [name] NP at Dr. Radonna Ricker office  . Arthritis    "hips" (09/06/2014)  . Asthma   . Barrett's esophagus   . Chronic abdominal pain    narcotic dependence, dr gyarteng-dak at [facility]  . 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 Complication of anesthesia    pt states had too much xanax on board - agitated   . COPD (chronic obstructive pulmonary disease) ([place])   . Depression   . Exertional shortness of breath   . FEVER, RECURRENT 08/11/2009   Qualifier: Diagnosis of  By: [name] MD, [name]   . Fx of fibula 07-28-11   left fibula, 2 places   . Fx two ribs-open 08-24-11   left 4th and 5th  . GASTRIC OUTLET OBSTRUCTION 08/28/2007   In July 2008 she underwent laparoscopic enterolysis, Nissen fundoplication over a #78 bougie, single pledgeted suture colosure of the hiatus and a 3 suture wrap.  Lap truncal vagotomy and a loop gastrojejunostomy was performed.  This was revised in August of 2009 to a roux en Y gastrojejujostomy.     .  Gastroparesis 06/03/2007   Qualifier: Diagnosis of  By: [name] MD, [name]   . GERD (gastroesophageal reflux disease)   . Headache    "monthly" (09/06/2014)  . History of blood transfusion    "related to OR; maybe when I perforated that ulcer"  . History of hiatal hernia   . Lap Nissen + truncal vagotomy July 2008 05/13/2013  . LEG EDEMA 01/19/2008   Qualifier: Diagnosis of  By: [name] MD, [name]   . Menopause   . MENOPAUSE 01/07/2007   Qualifier: Diagnosis of  By: [name], CMA, [place]    . Myocardial infarction ([place])   . Narcotic abuse   . Peptic ulcer disease    dr Oletta Lamas  . Pneumonia, organism unspecified(486) 11/07/2010   Assoc with R Parapneumonic effusion 09/2010    - Tapped 10/05/10    - CxR resolved 11/07/2010    . S/P jejunostomy Feb 2016 07/26/2014    Patient Active Problem List   Diagnosis Date Noted  . Poor compliance with medication 11/20/2016  . Insomnia 11/20/2016  . Early onset macular degeneration 11/20/2016  . Legally blind 11/20/2016  . COPD exacerbation ([place]) 06/22/2016  . Abdominal pain   . Hypoxia 04/08/2015  . Opioid type dependence, continuous ([place])   . Protein-calorie malnutrition ([place])   . Episode of recurrent  major depressive disorder ([place])   . Secondary hyperparathyroidism ([place]) 01/10/2015  . Vitamin D deficiency 01/10/2015  . 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 Anemia, iron deficiency 11/03/2014  . Chronic pain syndrome 10/24/2014  . Osteoporosis 10/05/2014  . Vertebral compression fracture ([place]) 10/05/2014  . Coronary artery disease due to calcified coronary lesion 10/05/2014  . Weight loss   . Tobacco abuse   . History of subtotal gastrectomy with Roux-en-Y recontruction   . Orthostatic hypotension 08/11/2014  . Constipation, chronic 07/10/2013  . Chronic abdominal pain   . COPD (chronic obstructive pulmonary disease) ([place]) 11/07/2010  . Major depressive disorder, recurrent episode, moderate ([place]) 06/06/2010  . TARDIVE DYSKINESIA  11/17/2009  . Anxiety state 01/07/2007  . Barrett's esophagus 07/03/2005    Past Surgical History:  Procedure Laterality Date  . BIOPSY THYROID  08-13-11   benign nodule, per Dr. Melida Quitter   . CATARACT EXTRACTION W/ INTRAOCULAR LENS  IMPLANT, BILATERAL Bilateral   . COLONOSCOPY    . DILATION AND CURETTAGE OF UTERUS    . ESOPHAGOGASTRODUODENOSCOPY  12-29-09   dr qadeer at [facility], several gastric ulcers  . ESOPHAGOGASTRODUODENOSCOPY N/A 09/25/2012   Procedure: ESOPHAGOGASTRODUODENOSCOPY (EGD);  Surgeon: Beryle Beams, MD;  Location: [facility];  Service: Endoscopy;  Laterality: N/A;  . FRACTURE SURGERY    . GASTROJEJUNOSTOMY  02-20-08   Roux en Y gastrojejunsotomy w 1 foot Roux limb  . GASTROSTOMY N/A 07/26/2014   Procedure: LAPRASCOPIC ASSISTED OPEN PLACEMENT OF JEJUNOSTOMY TUBE;  Surgeon: Pedro Earls, MD;  Location: [facility];  Service: General;  Laterality: N/A;  . HERNIA REPAIR     "hiatal"  . JOINT REPLACEMENT    . LAPAROSCOPIC NISSEN FUNDOPLICATION    . LAPAROSCOPIC PARTIAL GASTRECTOMY N/A 06/29/2013   Procedure: Gastrectomy and GJ Tube placement;  Surgeon: Pedro Earls, MD;  Location: [facility];  Service: General;  Laterality: N/A;  . ORIF HIP FRACTURE Bilateral    "pins in both of them"  . REPAIR OF PERFORATED ULCER    . TONSILLECTOMY      OB History    No data available       Home Medications    Prior to Admission medications   Medication Sig Start Date End Date Taking? Authorizing Provider  albuterol (PROVENTIL HFA;VENTOLIN HFA) 108 (90 Base) MCG/ACT inhaler Inhale 2 puffs into the lungs every 4 (four) hours as needed (cough, shortness of breath or wheezing.). 08/01/15   Shawnee Knapp, MD  busPIRone (BUSPAR) 10 MG tablet Take 10 mg by mouth 2 (two) times daily. 11/16/16   [provider]  DULoxetine (CYMBALTA) 60 MG capsule TAKE (1) CAPSULE DAILY. 09/12/16   Shawnee Knapp, MD  furosemide (LASIX) 20 MG tablet TAKE 1 TABLET ONCE DAILY AS NEEDED FOR EDEMA. 08/30/16    Shawnee Knapp, MD  gabapentin (NEURONTIN) 300 MG capsule Take 1-2 capsules (300-600 mg total) by mouth at bedtime. 11/01/16   Shawnee Knapp, MD  hydrOXYzine (VISTARIL) 50 MG capsule TAKE 2 CAPSULES AT BEDTIME AS NEEDED FOR SLEEP. 10/17/16   Shawnee Knapp, MD  ipratropium-albuterol (DUONEB) 0.5-2.5 (3) MG/3ML SOLN Take 3 mLs by nebulization 4 (four) times daily -  before meals and at bedtime. 06/29/16   Shawnee Knapp, MD  OLANZapine (ZYPREXA) 20 MG tablet Take 1 tablet (20 mg total) by mouth at bedtime. 10/17/16   Shawnee Knapp, MD  omeprazole (PRILOSEC) 40 MG capsule take 1 capsule by mouth twice a day 11/10/16  Shawnee Knapp, MD  ondansetron (ZOFRAN ODT) 4 MG disintegrating tablet Take 1-2 tablets (4-8 mg total) by mouth every 8 (eight) hours as needed for nausea or vomiting. 11/25/16   Shawnee Knapp, MD  Oxcarbazepine (TRILEPTAL) 300 MG tablet Take 300 mg by mouth at bedtime. 11/16/16   [provider]  oxcarbazepine (TRILEPTAL) 600 MG tablet Take 600 mg by mouth daily. 11/09/16   [provider]  SUBOXONE 8-2 MG FILM Take 1 Film by mouth 2 (two) times daily. 11/10/16   [provider]    Family History Family History  Problem Relation Age of Onset  . Cancer Mother        kidney, magliant tumor on face  . Stroke Mother   . Celiac disease Father   . Cancer Father        kidney and pancreatic    Social History Social History  Substance Use Topics  . Smoking status: Current Every Day Smoker    Packs/day: 2.00    Years: 50.00    Types: Cigarettes  . Smokeless tobacco: Never Used  . Alcohol use No  No illicit drug use   Allergies   Lithium; Celebrex [celecoxib]; Morphine; Quetiapine; and Varenicline tartrate   Review of Systems Review of Systems  Constitutional: Negative.   HENT: Negative.   Respiratory: Negative.   Cardiovascular: Negative.   Gastrointestinal: Negative.   Musculoskeletal: Negative.   Skin: Negative.   Neurological: Negative.   Psychiatric/Behavioral:  Negative.   All other systems reviewed and are negative.    Physical Exam Updated Vital Signs BP 98/74 (BP Location: Right Arm)   Pulse 74   Temp 98.3 F (36.8 C) (Oral)   Resp 16   SpO2 94%   Physical Exam  Constitutional: She is oriented to person, place, and time. She appears well-developed and well-nourished. No distress.  HENT:  Head: Normocephalic and atraumatic.  Eyes: Conjunctivae are normal. Pupils are equal, round, and reactive to light.  Neck: Neck supple. No tracheal deviation present. No thyromegaly present.  Cardiovascular: Normal rate and regular rhythm.   No murmur heard. Pulmonary/Chest: Effort normal and breath sounds normal.  Abdominal: Soft. Bowel sounds are normal. She exhibits no distension. There is no tenderness.  Musculoskeletal: Normal range of motion. She exhibits no edema or tenderness.  Neurological: She is alert and oriented to person, place, and time. Coordination normal.  Gait normal not lightheaded on standing.  Skin: Skin is warm and dry. No rash noted.  Psychiatric: She has a normal mood and affect.  Nursing note and vitals reviewed.    ED Treatments / Results  Labs (all labs ordered are listed, but only abnormal results are displayed) Labs Reviewed - No data to display  EKG  EKG Interpretation None       Radiology No results found.  Procedures Procedures (including critical care time)  Medications Ordered in ED Medications - No data to display   Initial Impression / Assessment and Plan / ED Course  I have reviewed the triage vital signs and the nursing notes.  Pertinent labs & imaging results that were available during my care of the patient were reviewed by me and considered in my medical decision making (see chart for details).     Normal exam. Plan return as needed. She is encouraged to keep her scheduled appointment with her primary care physician for checkup next week  Final Clinical Impressions(s) / ED Diagnoses   Dx Sleepiness Final diagnoses:  Sleepiness  New Prescriptions New Prescriptions   No medications on file     Orlie Dakin, MD 12/27/16 [phone]

## 2016-12-27 NOTE — Discharge Instructions (Signed)
Return if concern for any reason. Keep your next scheduled plan with Dr.Shaw

## 2016-12-27 NOTE — ED Notes (Signed)
PT DECLINED WHEELCHAIR. REQUESTED TO AMBULATE

## 2016-12-27 NOTE — ED Triage Notes (Signed)
Pt from home via EMS- Per EMS, pt sts that she has no complaints and feels fine. Pt roommate called EMS sts that pt is "sleeping too much." Pt refused and then agreed to be seen at roommate request. Pt sts that she wants to sleep in. Pt is A&O and in NAD. VSS

## 2016-12-27 NOTE — ED Notes (Signed)
Bed: WA25 Expected date:  Expected time:  Means of arrival:  Comments: EMS-weak 

## 2017-01-03 ENCOUNTER — Ambulatory Visit (INDEPENDENT_AMBULATORY_CARE_PROVIDER_SITE_OTHER): Payer: Medicare Other | Admitting: Family Medicine

## 2017-01-03 ENCOUNTER — Encounter: Payer: Self-pay | Admitting: Family Medicine

## 2017-01-03 VITALS — BP 120/72 | HR 68 | Temp 99.2°F | Resp 16 | Ht 63.5 in | Wt 104.8 lb

## 2017-01-03 DIAGNOSIS — R636 Underweight: Secondary | ICD-10-CM

## 2017-01-03 DIAGNOSIS — Z720 Tobacco use: Secondary | ICD-10-CM | POA: Diagnosis not present

## 2017-01-03 DIAGNOSIS — J449 Chronic obstructive pulmonary disease, unspecified: Secondary | ICD-10-CM

## 2017-01-03 DIAGNOSIS — R531 Weakness: Secondary | ICD-10-CM

## 2017-01-03 DIAGNOSIS — R5382 Chronic fatigue, unspecified: Secondary | ICD-10-CM

## 2017-01-03 DIAGNOSIS — R634 Abnormal weight loss: Secondary | ICD-10-CM | POA: Diagnosis not present

## 2017-01-03 LAB — POCT URINALYSIS DIP (MANUAL ENTRY)
BILIRUBIN UA: NEGATIVE
Blood, UA: NEGATIVE
GLUCOSE UA: NEGATIVE mg/dL
Ketones, POC UA: NEGATIVE mg/dL
Nitrite, UA: NEGATIVE
Protein Ur, POC: NEGATIVE mg/dL
SPEC GRAV UA: 1.02 (ref 1.010–1.025)
Urobilinogen, UA: 0.2 E.U./dL
pH, UA: 5.5 (ref 5.0–8.0)

## 2017-01-03 LAB — POCT CBC
GRANULOCYTE PERCENT: 65.5 % (ref 37–80)
HEMATOCRIT: 36.7 % — AB (ref 37.7–47.9)
Hemoglobin: 11.7 g/dL — AB (ref 12.2–16.2)
Lymph, poc: 2 (ref 0.6–3.4)
MCH, POC: 28.3 pg (ref 27–31.2)
MCHC: 31.9 g/dL (ref 31.8–35.4)
MCV: 88.6 fL (ref 80–97)
MID (CBC): 0.4 (ref 0–0.9)
MPV: 8.5 fL (ref 0–99.8)
POC Granulocyte: 4.5 (ref 2–6.9)
POC LYMPH %: 28.9 % (ref 10–50)
POC MID %: 5.6 %M (ref 0–12)
Platelet Count, POC: 298 10*3/uL (ref 142–424)
RBC: 4.14 M/uL (ref 4.04–5.48)
RDW, POC: 14.3 %
WBC: 6.9 10*3/uL (ref 4.6–10.2)

## 2017-01-03 LAB — POC MICROSCOPIC URINALYSIS (UMFC): MUCUS RE: ABSENT

## 2017-01-03 NOTE — Patient Instructions (Signed)
Community Occupational psychologist of Services Cost  A Matter of Balance Class locations vary. Call [place] on Aging for more information.  http://dawson-may.com/ [phone] 8-Session program addressing the fear of falling and increasing activity levels of older adults Free to minimal cost  A.C.T. By [employer] [address], [place], [place] [address].  BetaBlues.dk [phone]  Personal training, gym, classes including Silver Sneakers* and ACTion for Aging Adults Fee-based  A.H.O.Y. (Add Health to [place]) Airs on Time [employer] 13, M-F at [place]: [employer],  [place] [facility] [place] [place],  [place], [facility], [address] [facility], [place], [place], [place] [address]  [place] Location: Sharrell Ku. [employer] [place]      [phone]  [phone]  [phone]  [phone]  [phone]  [phone]  [phone]  [phone]  [phone]  [phone]    [phone] A total-body conditioning class for adults 77 and older; designed to increase muscular strength, endurance, range of movement, flexibility, balance, agility and coordination Free  [facility] [place], [place] [address] [place]      [address]      [phone]      [place]'s class for individualsreturning to exercise after an injury, before or after surgery or for individuals with complex musculoskeletal issues; designed to improve strength, balance , flexibility      $15/class  [place] [address] [place], [place] [address] www.[email or web address].dk [place] classes for beginners to advanced [place], [place] [address] [email or web address] [place] Chair Exercises Free, ages 77 and older; Ages 43-59 fee based  Marvia Pickles, [facility] [address] [place], [place] [address] Seniorcenter_1 .Beverlee Nims [phone]  A.H.O.Y. Tai Chi Fee-based Donation based or free  [place] Class locations vary.  Call or email Angela Burke or view website for more information. Info_[email or web address] .com GainPain.com.cy.html [phone] Ongoing classes at local YMCAs and gyms Fee-based  Silver Sneakers A.C.T. By [place] [employer]: [facility] [place] [phone]  [phone] Classes designed for older adults who want to improve their strength, flexibility, balance and endurance.   Silver sneakers is covered by some insurance plans and includes a fitness center membership at participating locations. Find out more by calling [phone] or visiting www.silversneakers.com Covered by some insurance plans  [facility] [place] [phone] A.H.O.Y., fitness room, personal training, fitness classes for injury prevention, strength, balance, flexibility, water fitness classes Ages 55+: $36 for 6 months; Ages 80-54: $13 for 6 months  Tai Chi for Everybody [facility] [address] [place], [place] [address] Taichiforeverybody_3 .Patsi Sears [phone] Tai Chi classes for beginners to advanced; geared for seniors Donation Based  [employer] ([place] Others Participate in Exercise     Loyal Gambler. Rosana Hoes, PhD, [place] [email or web address] [place]     [phone]     A comprehensive fitness program for adults.  The program paris senior-level undergraduates Kinesiology students with adults who desire to learn how to exercise safely.  Includes a structural exercise class focusing on functional fitnesss     $100/semester in fall and spring; $75 in summer (no trainers)    *Silver Sneakers is covered by some Personal assistant and includes a  Radio producer at participating locations.  Find out more by calling [phone] or visiting www.silversneakers.com  For additional health and human services resources for senior adults, please contact SeniorLine at [phone] in [place] and [place] at [phone] in all other areas.  Failure to Thrive, Adult Failure to thrive is a group of symptoms that affect elderly adults. These symptoms include loss of appetite and weight loss. People who have this condition may do fewer and fewer activities over time. They may lose interest in being with friends or they may not want to eat or drink. This condition is not a normal part of aging. What are the causes? This condition may be caused by:  A disease, such dementia, diabetes, cancer, or lung disease.  A health problem, such as a vitamin deficiency or a heart problem.  A disorder, such as depression.  A disability.  Medicines.  Mistreatment or neglect.  In some cases, the cause may not be known. What are the signs or symptoms? Symptoms of this condition include:  Loss of more than 5% of your body weight.  Being more tired than normal after an activity.  Having trouble getting up after sitting.  Loss of  appetite.  Not getting out of bed.  Not wanting to do usual activities.  Depression.  Getting infections often.  Bedsores.  Taking a long time to recover after an injury or a surgery.  Weakness.  How is this diagnosed? This condition may be diagnosed with a physical exam. Your health care provider will ask questions about your health, behavior, and mood, such as:  Has your activity changed?  Do you seem sad?  Are your eating habits different?  Tests may also be done. They may include:  Blood tests.  Urine tests.  Imaging tests, such as X-rays, a CT scan, or MRI.  Hearing tests.  Vision tests.  Tests to check thinking ability (cognitive tests).  Activity tests to see if you can do tasks such as bathing and dressing and to see if you can move around safely.  You may be referred to a specialist. How is this treated? Treatment for this condition depends on the cause. It may involve:  Treating the cause.  Talk therapy or medicine to treat depression.  Improving diet, such as by eating more often or taking nutritional supplements.  Changing or stopping a medicine.  Physical therapy.  It often takes a team of health care providers to find the right treatment. Follow these instructions at home:  Take over-the-counter and prescription medicines only as told by your health care provider.  Eat a healthy, well-balanced diet. Make sure to get enough calories in each meal.  Be physically active. Include strength training as part of your exercise routine. A physical therapist can help to set up an exercise program that fits you.  Make sure that you are safe at home.  Make sure that you have a plan for what to do if you  become unable to make decisions for yourself. Contact a health care provider if:  You are not able to eat well.  You are not able to move around.  You feel very sad or hopeless. Get help right away if:  You have thoughts of ending your  life.  You cannot eat or drink.  You do not get out of bed.  Staying at home is no longer safe.  You have a fever. This information is not intended to replace advice given to you by your health care provider. Make sure you discuss any questions you have with your health care provider. Document Released: 09/02/2011 Document Revised: 11/16/2015 Document Reviewed: 09/05/2014 Elsevier Interactive Patient Education  [employer].

## 2017-01-03 NOTE — Progress Notes (Addendum)
Subjective:  By signing my name below, I, [name], attest that this documentation has been prepared under the direction and in the presence of [name], MD. Electronically Signed: [name], [place]. 01/03/2017 , 9:53 AM .  Patient was seen in Room 10 .   Patient ID: [name], female    DOB: 09-06-1950, 66 y.o.   MRN: [id number] Chief Complaint  Patient presents with  . Fatigue  . Weight Loss   HPI [name] is a 66 y.o. female who presents to Primary Care at [facility] complaining of fatigue and weight loss.   Weight loss She's been checking her weight on her scale at home, and notes her weight has gone down by 10 lbs. She reports feeling lighter.   Wt Readings from Last 3 Encounters:  01/03/17 104 lb 12.8 oz (47.5 kg)  11/25/16 105 lb 6.4 oz (47.8 kg)  11/01/16 103 lb (46.7 kg)   Fatigue She states she's been more bedridden. She denies lightheadedness, dizziness, loss in appetite, urinary or bowel symptoms. Her breathing is doing, wearing her oxygen at home and when sleeping. She hasn't used her nebulizer at home. Her psychiatrist, Dr. Owens Shark, has started her on suboxone. She's also on belsomra for sleep, trileptal and cymbalta.   She was feeling fatigue, and her roommate called EMS for her to be transported to the ER. Her roommates thought she had taken too many sleeping pills. Her exam and labs were normal in the ER.   Past Medical History:  Diagnosis Date  . ACUTE DUODEN ULCER W/HEMORR PERF&OBSTRUCTION 06/26/2004   Qualifier: Diagnosis of  By: [name] MD, [name]   . Acute duodenal ulcer with hemorrhage and perforation, with obstruction ([place])   . Allergy   . Anemia   . Anginal pain ([place])    "many many years ago"  . Anxiety    sees [name] NP at Dr. Radonna Ricker office  . Arthritis    "hips" (09/06/2014)  . Asthma   . Barrett's esophagus   . Chronic abdominal pain    narcotic dependence, [facility]eng[facility]dak [facility]  . Chronic duodenal ulcer  with gastric outlet obstruction 06/29/2013  . Complication of anesthesia    pt states had too much xanax on board - agitated   . COPD (chronic obstructive pulmonary disease) ([place])   . Depression   . Exertional shortness of breath   . FEVER, RECURRENT 08/11/2009   Qualifier: Diagnosis of  By: [name] MD, [name]   . Fx of fibula 07-28-11   left fibula, 2 places   . Fx two ribs-open 08-24-11   left 4th and 5th  . GASTRIC OUTLET OBSTRUCTION 08/28/2007   In July 2008 she underwent laparoscopic enterolysis, Nissen fundoplication over a #27 bougie, single pledgeted suture colosure of the hiatus and a 3 suture wrap.  Lap truncal vagotomy and a loop gastrojejunostomy was performed.  This was revised in August of 2009 to a roux en Y gastrojejujostomy.     . Gastroparesis 06/03/2007   Qualifier: Diagnosis of  By: [name] MD, [name]   . GERD (gastroesophageal reflux disease)   . Headache    "monthly" (09/06/2014)  . History of blood transfusion    "related to OR; maybe when I perforated that ulcer"  . History of hiatal hernia   . Lap Nissen + truncal vagotomy July 2008 05/13/2013  . LEG EDEMA 01/19/2008   Qualifier: Diagnosis of  By: [name] MD, [name]   .  Menopause   . MENOPAUSE 01/07/2007   Qualifier: Diagnosis of  By: [name], CMA, [place]    . Myocardial infarction ([place])   . Narcotic abuse   . Peptic ulcer disease    dr Oletta Lamas  . Pneumonia, organism unspecified(486) 11/07/2010   Assoc with R Parapneumonic effusion 09/2010    - Tapped 10/05/10    - CxR resolved 11/07/2010    . 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 Authorizing Provider  albuterol (PROVENTIL HFA;VENTOLIN HFA) 108 (90 Base) MCG/ACT inhaler Inhale 2 puffs into the lungs every 4 (four) hours as needed (cough, shortness of breath or wheezing.). 08/01/15  Yes Shawnee Knapp, MD  busPIRone (BUSPAR) 10 MG tablet Take 10 mg by mouth 2 (two) times daily. 11/16/16  Yes [provider]  DULoxetine (CYMBALTA) 60 MG capsule TAKE (1) CAPSULE DAILY. 09/12/16  Yes Shawnee Knapp, MD  furosemide (LASIX) 20 MG tablet TAKE 1 TABLET ONCE DAILY AS NEEDED FOR EDEMA. 08/30/16  Yes Shawnee Knapp, MD  OLANZapine (ZYPREXA) 20 MG tablet Take 1 tablet (20 mg total) by mouth at bedtime. 10/17/16  Yes Shawnee Knapp, MD  SUBOXONE 8-2 MG FILM Take 1 Film by mouth 2 (two) times daily. 11/10/16  Yes [provider]  Suvorexant (BELSOMRA PO) Take by mouth.   Yes [provider]  gabapentin (NEURONTIN) 300 MG capsule Take 1-2 capsules (300-600 mg total) by mouth at bedtime. Patient not taking: Reported on 01/03/2017 11/01/16   Shawnee Knapp, MD  hydrOXYzine (VISTARIL) 50 MG capsule TAKE 2 CAPSULES AT BEDTIME AS NEEDED FOR SLEEP. Patient not taking: Reported on 01/03/2017 10/17/16   Shawnee Knapp, MD  ipratropium-albuterol (DUONEB) 0.5-2.5 (3) MG/3ML SOLN Take 3 mLs by nebulization 4 (four) times daily -  before meals and at bedtime. Patient not taking: Reported on 01/03/2017 06/29/16   Shawnee Knapp, MD  omeprazole (PRILOSEC) 40 MG capsule take 1 capsule by mouth twice a day Patient not taking: Reported on 01/03/2017 11/10/16   Shawnee Knapp, MD  ondansetron (ZOFRAN ODT) 4 MG disintegrating tablet Take 1-2 tablets (4-8 mg total) by mouth every 8 (eight) hours as needed for nausea or vomiting. Patient not taking: Reported on 01/03/2017 11/25/16   Shawnee Knapp, MD  Oxcarbazepine (TRILEPTAL) 300 MG tablet Take 300 mg by mouth at bedtime. 11/16/16   [provider]  oxcarbazepine (TRILEPTAL) 600 MG tablet Take 600 mg by mouth daily. 11/09/16   [provider]   Allergies  Allergen Reactions  . Lithium Nausea And Vomiting  . Celebrex [Celecoxib] Other (See Comments)    History of bleeding ulcers  . Morphine Itching  . Quetiapine Other (See Comments)     tardive dyskinesia  . Varenicline Tartrate Other (See Comments)    hallucinations   Review of Systems  Constitutional: Positive  for fatigue and unexpected weight change. Negative for chills and fever.  Respiratory: Negative for cough, shortness of breath and wheezing.   Gastrointestinal: Negative for abdominal pain, constipation, diarrhea, nausea and vomiting.  Genitourinary: Negative for dysuria and vaginal bleeding.  Skin: Negative for rash and wound.  Neurological: Negative for dizziness, weakness and headaches.       Objective:   Physical Exam  Constitutional: She is oriented to person, place, and time. She appears well-developed and well-nourished. No distress.  HENT:  Head: Normocephalic and atraumatic.  Eyes: Pupils are equal, round, and  reactive to light. EOM are normal.  Neck: Neck supple. No thyromegaly present.  Cardiovascular: Normal rate and regular rhythm.   Murmur heard.  Systolic (left lower sternal border, systolic ejection murmur) murmur is present with a grade of 1/6  Pulmonary/Chest: Effort normal. No respiratory distress. She has decreased breath sounds in the left lower field. She has wheezes (inspiratory). She has rales (rare inspiratory).  Abdominal: Soft. Bowel sounds are normal. She exhibits no distension and no mass. There is no hepatosplenomegaly. There is no tenderness.  Musculoskeletal: Normal range of motion.  Lymphadenopathy:    She has no cervical adenopathy.  Neurological: She is alert and oriented to person, place, and time.  Skin: Skin is warm and dry.  Psychiatric: She has a normal mood and affect. Her behavior is normal.  Nursing note and vitals reviewed.   BP 120/72   Pulse 68   Temp 99.2 F (37.3 C) (Oral)   Resp 16   Ht 5' 3.5" (1.613 m)   Wt 104 lb 12.8 oz (47.5 kg)   SpO2 98%   BMI 18.27 kg/m   Results for orders placed or performed in visit on 01/03/17  POCT CBC  Result Value Ref Range   WBC 6.9 4.6 - 10.2 K/uL   Lymph, poc 2.0 0.6 - 3.4   POC LYMPH PERCENT 28.9 10 - 50 %L   MID (cbc) 0.4 0 - 0.9   POC MID % 5.6 0 - 12 %M   POC Granulocyte 4.5 2 -  6.9   Granulocyte percent 65.5 37 - 80 %G   RBC 4.14 4.04 - 5.48 M/uL   Hemoglobin 11.7 (A) 12.2 - 16.2 g/dL   HCT, POC 36.7 (A) 37.7 - 47.9 %   MCV 88.6 80 - 97 fL   MCH, POC 28.3 27 - 31.2 pg   MCHC 31.9 31.8 - 35.4 g/dL   RDW, POC 14.3 %   Platelet Count, POC 298 142 - 424 K/uL   MPV 8.5 0 - 99.8 fL  POCT urinalysis dipstick  Result Value Ref Range   Color, UA yellow yellow   Clarity, UA clear clear   Glucose, UA negative negative mg/dL   Bilirubin, UA negative negative   Ketones, POC UA negative negative mg/dL   Spec Grav, UA 1.020 1.010 - 1.025   Blood, UA negative negative   pH, UA 5.5 5.0 - 8.0   Protein Ur, POC negative negative mg/dL   Urobilinogen, UA 0.2 0.2 or 1.0 E.U./dL   Nitrite, UA Negative Negative   Leukocytes, UA Trace (A) Negative  POCT Microscopic Urinalysis ([facility])  Result Value Ref Range   WBC,UR,HPF,POC None None WBC/hpf   RBC,UR,HPF,POC None None RBC/hpf   Bacteria None None, Too numerous to count   Mucus Absent Absent   Epithelial Cells, UR Per Microscopy Few (A) None, Too numerous to count cells/hpf   EKG: borderline bradycardic rate 59-62, has diffuse low voltage; worsening of R-wave progression since last EKG on 06/22/2016, but otherwise unchanged.  Orthostatics negative - BP increases with standing from 120/74 -> 145/80 and HR remains steady in the 60s.     Assessment & Plan:   1. Chronic fatigue - chronic complaint for pt - still not sleeping at all but working with psych which is otherwise going well.  Pt actually looks pretty good for her baseline on exam - put together very well and in good spirits.  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  Med list updated based upon recent pharmacy fills alone.  2. Weakness   3. Underweight   4. Tobacco abuse - unchanged, pt using O2 more but denies smoking  inside since has O2 at home  5. Weight loss - weight steady for years, restart daily prot supp drink  6. Chronic obstructive pulmonary disease, unspecified COPD type ([place])     Orders Placed This Encounter  Procedures  . Comprehensive metabolic panel  . TSH  . Ambulatory Referral for Lung Cancer Scre    Referral Priority:   Routine    Referral Type:   Consultation    Referral Reason:   Specialty Services Required    Number of Visits Requested:   1  . Orthostatic vital signs  . POCT CBC  . POCT urinalysis dipstick  . POCT Microscopic Urinalysis ([facility])  . EKG 12-Lead    Meds ordered this encounter  Medications  . DISCONTD: Suvorexant (BELSOMRA PO)    Sig: Take by mouth.  . perphenazine (TRILAFON) 4 MG tablet    Sig: Take 4 mg by mouth 3 (three) times daily.  . BELSOMRA 20 MG TABS    Sig: Take 20 mg by mouth at bedtime.    I personally performed the services described in this documentation, which was scribed in my presence. The recorded information has been reviewed and considered, and addended by me as needed.   [name], M.D.  Primary Care at [facility] [address] [place], [place] [address] [phone] phone [phone] fax  01/06/17 8:53 AM

## 2017-01-04 LAB — COMPREHENSIVE METABOLIC PANEL
A/G RATIO: 1.6 (ref 1.2–2.2)
ALT: 10 IU/L (ref 0–32)
AST: 17 IU/L (ref 0–40)
Albumin: 3.6 g/dL (ref 3.6–4.8)
Alkaline Phosphatase: 98 IU/L (ref 39–117)
BUN/Creatinine Ratio: 7 — ABNORMAL LOW (ref 12–28)
BUN: 5 mg/dL — AB (ref 8–27)
CALCIUM: 8.8 mg/dL (ref 8.7–10.3)
CHLORIDE: 101 mmol/L (ref 96–106)
CO2: 23 mmol/L (ref 20–29)
Creatinine, Ser: 0.69 mg/dL (ref 0.57–1.00)
GFR calc Af Amer: 106 mL/min/{1.73_m2} (ref >59)
GFR, EST NON AFRICAN AMERICAN: 92 mL/min/{1.73_m2} (ref >59)
GLUCOSE: 90 mg/dL (ref 65–99)
Globulin, Total: 2.3 g/dL (ref 1.5–4.5)
POTASSIUM: 4.3 mmol/L (ref 3.5–5.2)
Sodium: 140 mmol/L (ref 134–144)
Total Protein: 5.9 g/dL — ABNORMAL LOW (ref 6.0–8.5)

## 2017-01-04 LAB — TSH: TSH: 1.49 u[IU]/mL (ref 0.450–4.500)

## 2017-01-13 ENCOUNTER — Other Ambulatory Visit: Payer: Self-pay | Admitting: Family Medicine

## 2017-01-14 ENCOUNTER — Other Ambulatory Visit: Payer: Self-pay | Admitting: Family Medicine

## 2017-01-16 ENCOUNTER — Telehealth: Payer: Self-pay | Admitting: Family Medicine

## 2017-01-16 NOTE — Telephone Encounter (Signed)
Dr Brigitte Pulse Pt calling for a refill on Chantix to stop smoking

## 2017-01-17 NOTE — Telephone Encounter (Signed)
Please advise..the patient was seen on 01/03/2017

## 2017-01-18 DIAGNOSIS — J449 Chronic obstructive pulmonary disease, unspecified: Secondary | ICD-10-CM | POA: Diagnosis not present

## 2017-01-18 NOTE — Telephone Encounter (Signed)
No to Chantix right now. We tried this medication one year ago and within 3 days, patient reported to the office in extreme emotional distress, tearful, stating life wasn't worth living. She was having nausea, abdominal pain, weight loss. It took several days for the medication to get out of her system which was very psychologically dangerous time and it did not help her reduce smoking at all within the several days that she did take it.  If she really wants to retry it, please let me know and I will call her psychiatrist to discuss this and get his opinion prior to prescribing.

## 2017-01-20 NOTE — Telephone Encounter (Signed)
Left message to return call 

## 2017-01-22 ENCOUNTER — Ambulatory Visit: Payer: Medicare Other | Admitting: Family Medicine

## 2017-01-27 ENCOUNTER — Ambulatory Visit: Payer: Medicare Other | Admitting: Family Medicine

## 2017-02-04 ENCOUNTER — Other Ambulatory Visit: Payer: Self-pay | Admitting: Family Medicine

## 2017-02-07 NOTE — Telephone Encounter (Signed)
Please advise. dg 

## 2017-02-10 ENCOUNTER — Other Ambulatory Visit: Payer: Self-pay | Admitting: Family Medicine

## 2017-02-12 ENCOUNTER — Other Ambulatory Visit: Payer: Self-pay | Admitting: Family Medicine

## 2017-02-12 NOTE — Telephone Encounter (Signed)
Dr, Brigitte Pulse  This doesn't look like it has been filled recently.  Please advise

## 2017-02-12 NOTE — Telephone Encounter (Signed)
Please advise 

## 2017-02-13 ENCOUNTER — Encounter: Payer: Self-pay | Admitting: *Deleted

## 2017-02-13 ENCOUNTER — Other Ambulatory Visit: Payer: Self-pay | Admitting: Family Medicine

## 2017-02-13 NOTE — Telephone Encounter (Signed)
no. As I have noted in all other refill requests, she needs to contact her new psychiatrist Dr. Owens Shark at [facility] for any refills as I am not sure if she is still supposed to be on this medication.

## 2017-02-18 DIAGNOSIS — J449 Chronic obstructive pulmonary disease, unspecified: Secondary | ICD-10-CM | POA: Diagnosis not present

## 2017-02-22 ENCOUNTER — Telehealth: Payer: Self-pay | Admitting: Family Medicine

## 2017-02-22 MED ORDER — METHOCARBAMOL 750 MG PO TABS: 750.0000 mg | ORAL_TABLET | Freq: Four times a day (QID) | ORAL | 0 refills | Status: DC

## 2017-02-26 ENCOUNTER — Encounter: Payer: Self-pay | Admitting: Family Medicine

## 2017-02-26 ENCOUNTER — Ambulatory Visit (INDEPENDENT_AMBULATORY_CARE_PROVIDER_SITE_OTHER): Payer: Medicare Other | Admitting: Family Medicine

## 2017-02-26 ENCOUNTER — Ambulatory Visit (INDEPENDENT_AMBULATORY_CARE_PROVIDER_SITE_OTHER): Payer: Medicare Other

## 2017-02-26 VITALS — BP 131/79 | HR 98 | Temp 98.1°F | Resp 18 | Ht 63.5 in | Wt 107.8 lb

## 2017-02-26 DIAGNOSIS — S4991XA Unspecified injury of right shoulder and upper arm, initial encounter: Secondary | ICD-10-CM | POA: Diagnosis not present

## 2017-02-26 DIAGNOSIS — M25521 Pain in right elbow: Secondary | ICD-10-CM | POA: Diagnosis not present

## 2017-02-26 DIAGNOSIS — S59901A Unspecified injury of right elbow, initial encounter: Secondary | ICD-10-CM

## 2017-02-26 DIAGNOSIS — M19011 Primary osteoarthritis, right shoulder: Secondary | ICD-10-CM | POA: Diagnosis not present

## 2017-02-26 DIAGNOSIS — Z79899 Other long term (current) drug therapy: Secondary | ICD-10-CM

## 2017-02-26 MED ORDER — ONDANSETRON 8 MG PO TBDP: 8.0000 mg | ORAL_TABLET | Freq: Three times a day (TID) | ORAL | 2 refills | Status: DC | PRN

## 2017-02-26 MED ORDER — HYDROCODONE-ACETAMINOPHEN 5-325 MG PO TABS: 1.0000 | ORAL_TABLET | Freq: Four times a day (QID) | ORAL | 0 refills | Status: DC | PRN

## 2017-02-26 NOTE — Progress Notes (Addendum)
Subjective:   02/26/2017 , 12:24 PM .  Patient was seen in Room 2 .   Patient ID: [name], female    DOB: 1950-07-20, 66 y.o.   MRN: [id number] Chief Complaint  Patient presents with  . Personal issue  . Depression   HPI [name] is a 66 y.o. female who presents to Primary Care at [facility] complaining of   Is on Suboxone for chronic pain. Dr. Owens Shark at [facility] works 2 sat/mo.   In May he had her on Suboxone.  May 19. Is on Belsomra 20mg  rx'ed 7/7 Trilafon 4mg  1-2 tabs for mood stabilization and sleep at night buspar And cymbalta - only had 2 refills in May. Her appt is this Saturday 9/8 at 1 pm    Past Medical History:  Diagnosis Date  . ACUTE DUODEN ULCER W/HEMORR PERF&OBSTRUCTION 06/26/2004   Qualifier: Diagnosis of  By: [name] MD, [name]   . Acute duodenal ulcer with hemorrhage and perforation, with obstruction ([place])   . Allergy   . Anemia   . Anginal pain ([place])    "many many years ago"  . Anxiety    sees [name] NP at Dr. Radonna Ricker office  . Arthritis    "hips" (09/06/2014)  . Asthma   . Barrett's esophagus   . Chronic abdominal pain    narcotic dependence, [facility] gy[facility]eng-dak [facility]  . 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 Complication of anesthesia    pt states had too much xanax on board - agitated   . COPD (chronic obstructive pulmonary disease) ([place])   . Depression   . Exertional shortness of breath   . FEVER, RECURRENT 08/11/2009   Qualifier: Diagnosis of  By: [name] MD, [name]   . Fx of fibula 07-28-11   left fibula, 2 places   . Fx two ribs-open 08-24-11   left 4th and 5th  . GASTRIC OUTLET OBSTRUCTION 08/28/2007   In July 2008 she underwent laparoscopic enterolysis, Nissen fundoplication over a #29 bougie, single pledgeted suture colosure of the hiatus and a 3 suture wrap.  Lap truncal vagotomy and a loop gastrojejunostomy was performed.  This was revised in August of 2009 to a  roux en Y gastrojejujostomy.     . Gastroparesis 06/03/2007   Qualifier: Diagnosis of  By: [name] MD, [name]   . GERD (gastroesophageal reflux disease)   . Headache    "monthly" (09/06/2014)  . History of blood transfusion    "related to OR; maybe when I perforated that ulcer"  . History of hiatal hernia   . Lap Nissen + truncal vagotomy July 2008 05/13/2013  . LEG EDEMA 01/19/2008   Qualifier: Diagnosis of  By: [name] MD, [name]   . Menopause   . MENOPAUSE 01/07/2007   Qualifier: Diagnosis of  By: [name], CMA, [place]    . Myocardial infarction ([place])   . Narcotic abuse   . Peptic ulcer disease    dr Oletta Lamas  . Pneumonia, organism unspecified(486) 11/07/2010   Assoc with R Parapneumonic effusion 09/2010    - Tapped 10/05/10    - CxR resolved 11/07/2010    . 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 Authorizing Provider  albuterol (PROVENTIL HFA;VENTOLIN HFA) 108 (90 Base) MCG/ACT inhaler Inhale 2 puffs into the lungs every 4 (four) hours as needed (cough, shortness of breath or  wheezing.). 08/01/15  Yes Shawnee Knapp, MD  BELSOMRA 20 MG TABS Take 20 mg by mouth at bedtime. 12/28/16  Yes [provider]  busPIRone (BUSPAR) 10 MG tablet Take 10 mg by mouth 2 (two) times daily. 11/16/16  Yes [provider]  DULoxetine (CYMBALTA) 60 MG capsule TAKE (1) CAPSULE DAILY. 09/12/16  Yes Shawnee Knapp, MD  furosemide (LASIX) 20 MG tablet TAKE 1 TABLET ONCE DAILY AS NEEDED FOR EDEMA. 08/30/16  Yes Shawnee Knapp, MD  methocarbamol (ROBAXIN) 750 MG tablet Take 1 tablet (750 mg total) by mouth 4 (four) times daily. 02/22/17  Yes Shawnee Knapp, MD  omeprazole (PRILOSEC) 40 MG capsule  01/13/17  Yes [provider]  Oxcarbazepine (TRILEPTAL) 300 MG tablet Take 300 mg by mouth at bedtime. 11/16/16  Yes [provider]  oxcarbazepine (TRILEPTAL) 600 MG tablet Take 600 mg by mouth daily. 11/09/16  Yes [provider]    perphenazine (TRILAFON) 4 MG tablet Take 4 mg by mouth 3 (three) times daily. 12/28/16  Yes [provider]  promethazine (PHENERGAN) 6.25 MG/5ML syrup TAKE 20 ML'S BY MOUTH EVERY 8 HOURS AS NEEDED FOR NAUSEA. 02/16/17  Yes Shawnee Knapp, MD  SUBOXONE 8-2 MG FILM Take 1 Film by mouth 2 (two) times daily. 11/10/16  Yes [provider]   Allergies  Allergen Reactions  . Lithium Nausea And Vomiting  . Celebrex [Celecoxib] Other (See Comments)    History of bleeding ulcers  . Morphine Itching  . Quetiapine Other (See Comments)     tardive dyskinesia  . Varenicline Tartrate Other (See Comments)    hallucinations   Past Surgical History:  Procedure Laterality Date  . BIOPSY THYROID  08-13-11   benign nodule, per Dr. Melida Quitter   . CATARACT EXTRACTION W/ INTRAOCULAR LENS  IMPLANT, BILATERAL Bilateral   . COLONOSCOPY    . DILATION AND CURETTAGE OF UTERUS    . ESOPHAGOGASTRODUODENOSCOPY  12-29-09   dr qadeer at [facility], several gastric ulcers  . ESOPHAGOGASTRODUODENOSCOPY N/A 09/25/2012   Procedure: ESOPHAGOGASTRODUODENOSCOPY (EGD);  Surgeon: Beryle Beams, MD;  Location: [facility];  Service: Endoscopy;  Laterality: N/A;  . FRACTURE SURGERY    . GASTROJEJUNOSTOMY  02-20-08   Roux en Y gastrojejunsotomy w 1 foot Roux limb  . GASTROSTOMY N/A 07/26/2014   Procedure: LAPRASCOPIC ASSISTED OPEN PLACEMENT OF JEJUNOSTOMY TUBE;  Surgeon: Pedro Earls, MD;  Location: [facility];  Service: General;  Laterality: N/A;  . HERNIA REPAIR     "hiatal"  . JOINT REPLACEMENT    . LAPAROSCOPIC NISSEN FUNDOPLICATION    . LAPAROSCOPIC PARTIAL GASTRECTOMY N/A 06/29/2013   Procedure: Gastrectomy and GJ Tube placement;  Surgeon: Pedro Earls, MD;  Location: [facility];  Service: General;  Laterality: N/A;  . ORIF HIP FRACTURE Bilateral    "pins in both of them"  . REPAIR OF PERFORATED ULCER    . TONSILLECTOMY     Family History  Problem Relation Age of Onset  . Cancer Mother        kidney,  magliant tumor on face  . Stroke Mother   . Celiac disease Father   . Cancer Father        kidney and pancreatic   Social History   Social History  . Marital status: Divorced    Spouse name: N/A  . Number of children: N/A  . Years of education: N/A   Social History Main Topics  . Smoking status: Current Every  Day Smoker    Packs/day: 2.00    Years: 50.00    Types: Cigarettes  . Smokeless tobacco: Never Used  . Alcohol use No  . Drug use: No  . Sexual activity: No   Other Topics Concern  . None   Social History Narrative  . None   Depression screen [facility] 2/9 02/26/2017 01/03/2017 11/25/2016 11/01/2016 10/17/2016  Decreased Interest 3 3 1 3  0  Down, Depressed, Hopeless 3 3 1 3  0  PHQ - 2 Score 6 6 2 6  0  Altered sleeping 3 3 1 3  -  Tired, decreased energy 3 3 1 3  -  Change in appetite 3 3 1 2  -  Feeling bad or failure about yourself  3 0 1 0 -  Trouble concentrating 3 3 1 2  -  Moving slowly or fidgety/restless 0 1 1 3  -  Suicidal thoughts 0 0 1 0 -  PHQ-9 Score 21 19 9 19  -  Difficult doing work/chores - - - Extremely dIfficult -  Some recent data might be hidden    Review of Systems See hpi    Objective:   Physical Exam  Constitutional: She is oriented to person, place, and time. She appears well-developed and well-nourished. No distress.  HENT:  Head: Normocephalic and atraumatic.  Eyes: Pupils are equal, round, and reactive to light. EOM are normal.  Neck: Neck supple.  Cardiovascular: Normal rate.   Pulmonary/Chest: Effort normal. No respiratory distress.  Musculoskeletal: Normal range of motion.  Neurological: She is alert and oriented to person, place, and time.  Skin: Skin is warm and dry.  Psychiatric: She has a normal mood and affect. Her behavior is normal.  Nursing note and vitals reviewed.   BP 131/79   Pulse 98   Temp 98.1 F (36.7 C) (Oral)   Resp 18   Ht 5' 3.5" (1.613 m)   Wt 107 lb 12.8 oz (48.9 kg)   SpO2 94%   BMI 18.80 kg/m         Assessment & Plan:   1. Right shoulder injury, initial encounter   2. Elbow injury, right, initial encounter   3. Medication management     Orders Placed This Encounter  Procedures  . DG ELBOW COMPLETE RIGHT (3+VIEW)    Standing Status:   Future    Number of Occurrences:   1    Standing Expiration Date:   02/26/2018    Order Specific Question:   Reason for Exam (SYMPTOM  OR DIAGNOSIS REQUIRED)    Answer:   ttp lateral epicondyle with firm mass below, ttp AC joint, inability to abduct >90 deg    Order Specific Question:   Preferred imaging location?    Answer:   External  . DG Shoulder Right    Standing Status:   Future    Number of Occurrences:   1    Standing Expiration Date:   02/26/2018    Order Specific Question:   Reason for Exam (SYMPTOM  OR DIAGNOSIS REQUIRED)    Answer:   ttp lateral epicondyle with firm mass below, ttp AC joint, inability to abduct >90 deg    Order Specific Question:   Preferred imaging location?    Answer:   External  . ToxASSURE Select 13 (MW), Urine    Meds ordered this encounter  Medications  . ondansetron (ZOFRAN-ODT) 8 MG disintegrating tablet    Sig: Take 1 tablet (8 mg total) by mouth every 8 (eight) hours as needed for nausea.  Dispense:  20 tablet    Refill:  2  . HYDROcodone-acetaminophen (NORCO/VICODIN) 5-325 MG tablet    Sig: Take 1 tablet by mouth every 6 (six) hours as needed for moderate pain.    Dispense:  20 tablet    Refill:  0     [name], M.D.  Primary Care at [facility] [address] [place], [place] [address] [phone] phone [phone] fax  03/01/17 12:28 AM

## 2017-02-26 NOTE — Patient Instructions (Addendum)
     IF you received an x-ray today, you will receive an invoice from [facility]. Please contact [facility] at [phone] with questions or concerns regarding your invoice.   IF you received labwork today, you will receive an invoice from [facility]. Please contact [facility] at [phone] with questions or concerns regarding your invoice.   Our billing staff will not be able to assist you with questions regarding bills from these companies.  You will be contacted with the lab results as soon as they are available. The fastest way to get your results is to activate your My Chart account. Instructions are located on the last page of this paperwork. If you have not heard from [place] regarding the results in 2 weeks, please contact this office.    Make sure you sign a release for the [facility] so I can talk with them about the plan for your medications and treatment.

## 2017-03-03 LAB — TOXASSURE SELECT 13 (MW), URINE

## 2017-03-05 ENCOUNTER — Telehealth: Payer: Self-pay | Admitting: Family Medicine

## 2017-03-05 NOTE — Telephone Encounter (Signed)
PT CALLING TO LET DR Brigitte Pulse KNOW THAT A FORM FOR OXYGEN IS BEING FAXED TO HER AND TO CIRCLE THE FLOW SETTING ON FORM

## 2017-03-06 NOTE — Telephone Encounter (Signed)
Please see note below. 

## 2017-03-13 ENCOUNTER — Telehealth: Payer: Self-pay | Admitting: Family Medicine

## 2017-03-13 ENCOUNTER — Encounter: Payer: Self-pay | Admitting: Family Medicine

## 2017-03-13 ENCOUNTER — Ambulatory Visit (INDEPENDENT_AMBULATORY_CARE_PROVIDER_SITE_OTHER): Payer: Medicare Other | Admitting: Family Medicine

## 2017-03-13 VITALS — BP 104/67 | HR 77 | Temp 98.3°F | Resp 18 | Ht 63.5 in | Wt 104.8 lb

## 2017-03-13 DIAGNOSIS — M4850XS Collapsed vertebra, not elsewhere classified, site unspecified, sequela of fracture: Secondary | ICD-10-CM | POA: Diagnosis not present

## 2017-03-13 DIAGNOSIS — G8929 Other chronic pain: Secondary | ICD-10-CM

## 2017-03-13 DIAGNOSIS — H548 Legal blindness, as defined in USA: Secondary | ICD-10-CM

## 2017-03-13 DIAGNOSIS — Z9114 Patient's other noncompliance with medication regimen: Secondary | ICD-10-CM | POA: Diagnosis not present

## 2017-03-13 DIAGNOSIS — IMO0001 Reserved for inherently not codable concepts without codable children: Secondary | ICD-10-CM

## 2017-03-13 DIAGNOSIS — R0902 Hypoxemia: Secondary | ICD-10-CM

## 2017-03-13 DIAGNOSIS — G894 Chronic pain syndrome: Secondary | ICD-10-CM

## 2017-03-13 DIAGNOSIS — G4700 Insomnia, unspecified: Secondary | ICD-10-CM

## 2017-03-13 DIAGNOSIS — F411 Generalized anxiety disorder: Secondary | ICD-10-CM

## 2017-03-13 DIAGNOSIS — F1120 Opioid dependence, uncomplicated: Secondary | ICD-10-CM

## 2017-03-13 DIAGNOSIS — R109 Unspecified abdominal pain: Secondary | ICD-10-CM

## 2017-03-13 DIAGNOSIS — H3553 Other dystrophies primarily involving the sensory retina: Secondary | ICD-10-CM | POA: Diagnosis not present

## 2017-03-13 DIAGNOSIS — H3530 Unspecified macular degeneration: Secondary | ICD-10-CM

## 2017-03-13 DIAGNOSIS — Z23 Encounter for immunization: Secondary | ICD-10-CM

## 2017-03-13 MED ORDER — BUSPIRONE HCL 15 MG PO TABS: 15.0000 mg | ORAL_TABLET | Freq: Three times a day (TID) | ORAL | 0 refills | Status: DC

## 2017-03-13 MED ORDER — BELSOMRA 20 MG PO TABS: 20.0000 mg | ORAL_TABLET | Freq: Every day | ORAL | 0 refills | Status: DC

## 2017-03-13 MED ORDER — PERPHENAZINE 4 MG PO TABS: 8.0000 mg | ORAL_TABLET | Freq: Every day | ORAL | 0 refills | Status: DC

## 2017-03-13 NOTE — Progress Notes (Signed)
Subjective:    Patient ID: [name], female    DOB: 02/24/1951, 66 y.o.   MRN: [id number] Chief Complaint  Patient presents with  . Medication Refill    Pt states she lost here "everyday meds" and can't see other doctor til October. Pt states she isn't sure what all she is taking.    HPI  Dr. Owens Shark got her meds down to 3 or 4 only - saw him on 9/8 Saturday.  She states she still has suboxone but that it is not working as well for her pain anymore and wonders if she could be something stronger.  Lately has been arguing with [name] a lot.  She bought a car -  She thinks she took her meds yesterday but not sure.   States she still has "those films that I put on my tongue for pain left but no other medications.  I think I took them yesterday but I'm not sure.  I didn't think they were helping but today I feel really funny, weird, and horrible so I guess they must have been working.  I can't fine anything but a few of those films."  Likes that she is only taking 3 or 4 meds a day now (-no sure which ones she is taking about - likely buspar, trilafon, belsomra - not sure if she is on cymbalta. Never took robaxin, lasix is rare prn, states taking prilosec daily as well as very freq prn zofran or phenergan.  Past Medical History:  Diagnosis Date  . ACUTE DUODEN ULCER W/HEMORR PERF&OBSTRUCTION 06/26/2004   Qualifier: Diagnosis of  By: [name] MD, [name]   . Acute duodenal ulcer with hemorrhage and perforation, with obstruction ([place])   . Allergy   . Anemia   . Anginal pain ([place])    "many many years ago"  . Anxiety    sees [name] NP at Dr. Radonna Ricker office  . Arthritis    "hips" (09/06/2014)  . Asthma   . Barrett's esophagus   . Chronic abdominal pain    narcotic dependence, dr gyarteng-dak at [facility]  . 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 Complication of anesthesia    pt states had too much xanax on board - agitated   . COPD (chronic  obstructive pulmonary disease) ([place])   . Depression   . Exertional shortness of breath   . FEVER, RECURRENT 08/11/2009   Qualifier: Diagnosis of  By: [name] MD, [name]   . Fx of fibula 07-28-11   left fibula, 2 places   . Fx two ribs-open 08-24-11   left 4th and 5th  . GASTRIC OUTLET OBSTRUCTION 08/28/2007   In July 2008 she underwent laparoscopic enterolysis, Nissen fundoplication over a #24 bougie, single pledgeted suture colosure of the hiatus and a 3 suture wrap.  Lap truncal vagotomy and a loop gastrojejunostomy was performed.  This was revised in August of 2009 to a roux en Y gastrojejujostomy.     . Gastroparesis 06/03/2007   Qualifier: Diagnosis of  By: [name] MD, [name]   . GERD (gastroesophageal reflux disease)   . Headache    "monthly" (09/06/2014)  . History of blood transfusion    "related to OR; maybe when I perforated that ulcer"  . History of hiatal hernia   . Lap Nissen + truncal vagotomy July 2008 05/13/2013  . LEG EDEMA 01/19/2008   Qualifier: Diagnosis of  By: [name] MD, [name]   .  Menopause   . MENOPAUSE 01/07/2007   Qualifier: Diagnosis of  By: [name], CMA, [place]    . Myocardial infarction ([place])   . Narcotic abuse   . Peptic ulcer disease    dr Oletta Lamas  . Pneumonia, organism unspecified(486) 11/07/2010   Assoc with R Parapneumonic effusion 09/2010    - Tapped 10/05/10    - CxR resolved 11/07/2010    . S/P jejunostomy Feb 2016 07/26/2014   Past Surgical History:  Procedure Laterality Date  . BIOPSY THYROID  08-13-11   benign nodule, per Dr. Melida Quitter   . CATARACT EXTRACTION W/ INTRAOCULAR LENS  IMPLANT, BILATERAL Bilateral   . COLONOSCOPY    . DILATION AND CURETTAGE OF UTERUS    . ESOPHAGOGASTRODUODENOSCOPY  12-29-09   dr qadeer at [facility], several gastric ulcers  . ESOPHAGOGASTRODUODENOSCOPY N/A 09/25/2012   Procedure: ESOPHAGOGASTRODUODENOSCOPY (EGD);  Surgeon: Beryle Beams, MD;  Location: [facility];  Service: Endoscopy;  Laterality: N/A;  . FRACTURE SURGERY      . GASTROJEJUNOSTOMY  02-20-08   Roux en Y gastrojejunsotomy w 1 foot Roux limb  . GASTROSTOMY N/A 07/26/2014   Procedure: LAPRASCOPIC ASSISTED OPEN PLACEMENT OF JEJUNOSTOMY TUBE;  Surgeon: Pedro Earls, MD;  Location: [facility];  Service: General;  Laterality: N/A;  . HERNIA REPAIR     "hiatal"  . JOINT REPLACEMENT    . LAPAROSCOPIC NISSEN FUNDOPLICATION    . LAPAROSCOPIC PARTIAL GASTRECTOMY N/A 06/29/2013   Procedure: Gastrectomy and GJ Tube placement;  Surgeon: Pedro Earls, MD;  Location: [facility];  Service: General;  Laterality: N/A;  . ORIF HIP FRACTURE Bilateral    "pins in both of them"  . REPAIR OF PERFORATED ULCER    . TONSILLECTOMY     Current Outpatient Prescriptions on File Prior to Visit  Medication Sig Dispense Refill  . albuterol (PROVENTIL HFA;VENTOLIN HFA) 108 (90 Base) MCG/ACT inhaler Inhale 2 puffs into the lungs every 4 (four) hours as needed (cough, shortness of breath or wheezing.). 1 Inhaler 3  . DULoxetine (CYMBALTA) 60 MG capsule TAKE (1) CAPSULE DAILY. 30 capsule 0  . furosemide (LASIX) 20 MG tablet TAKE 1 TABLET ONCE DAILY AS NEEDED FOR EDEMA. 30 tablet 0  . methocarbamol (ROBAXIN) 750 MG tablet Take 1 tablet (750 mg total) by mouth 4 (four) times daily. 40 tablet 0  . omeprazole (PRILOSEC) 40 MG capsule   0  . ondansetron (ZOFRAN-ODT) 8 MG disintegrating tablet Take 1 tablet (8 mg total) by mouth every 8 (eight) hours as needed for nausea. 20 tablet 2  . promethazine (PHENERGAN) 6.25 MG/5ML syrup TAKE 20 ML'S BY MOUTH EVERY 8 HOURS AS NEEDED FOR NAUSEA. 473 mL 0  . SUBOXONE 8-2 MG FILM Take 1 Film by mouth 2 (two) times daily.     No current facility-administered medications on file prior to visit.    Allergies  Allergen Reactions  . Lithium Nausea And Vomiting  . Celebrex [Celecoxib] Other (See Comments)    History of bleeding ulcers  . Morphine Itching  . Quetiapine Other (See Comments)     tardive dyskinesia  . Varenicline Tartrate Other (See  Comments)    hallucinations   Family History  Problem Relation Age of Onset  . Cancer Mother        kidney, magliant tumor on face  . Stroke Mother   . Celiac disease Father   . Cancer Father        kidney and pancreatic   Social History  Social History  . Marital status: Divorced    Spouse name: N/A  . Number of children: N/A  . Years of education: N/A   Social History Main Topics  . Smoking status: Current Every Day Smoker    Packs/day: 2.00    Years: 50.00    Types: Cigarettes  . Smokeless tobacco: Never Used  . Alcohol use No  . Drug use: No  . Sexual activity: No   Other Topics Concern  . None   Social History Narrative  . None   Depression screen [facility] 2/9 02/26/2017 01/03/2017 11/25/2016 11/01/2016 10/17/2016  Decreased Interest 3 3 1 3  0  Down, Depressed, Hopeless 3 3 1 3  0  PHQ - 2 Score 6 6 2 6  0  Altered sleeping 3 3 1 3  -  Tired, decreased energy 3 3 1 3  -  Change in appetite 3 3 1 2  -  Feeling bad or failure about yourself  3 0 1 0 -  Trouble concentrating 3 3 1 2  -  Moving slowly or fidgety/restless 0 1 1 3  -  Suicidal thoughts 0 0 1 0 -  PHQ-9 Score 21 19 9 19  -  Difficult doing work/chores - - - Extremely dIfficult -  Some recent data might be hidden     Review of Systems  Constitutional: Positive for activity change (decrease), appetite change (decrease), fatigue and unexpected weight change (decrease). Negative for chills and fever.  Gastrointestinal: Positive for abdominal pain and nausea. Negative for constipation, diarrhea and vomiting.  Musculoskeletal: Positive for arthralgias, back pain, gait problem and myalgias.  Neurological: Positive for weakness.  Psychiatric/Behavioral: Positive for sleep disturbance.       Objective:   Physical Exam  Constitutional: She is oriented to person, place, and time. She appears well-developed and well-nourished. She appears cachectic. No distress.  HENT:  Head: Normocephalic and atraumatic.  Right  Ear: External ear normal.  Left Ear: External ear normal.  Eyes: Conjunctivae are normal. No scleral icterus.  Neck: Normal range of motion. Neck supple. No thyromegaly present.  Cardiovascular: Normal rate, regular rhythm, normal heart sounds and intact distal pulses.   Pulmonary/Chest: Effort normal and breath sounds normal. No respiratory distress.  Musculoskeletal: She exhibits no edema.  Lymphadenopathy:    She has no cervical adenopathy.  Neurological: She is alert and oriented to person, place, and time.  Skin: Skin is warm and dry. She is not diaphoretic. No erythema.  Psychiatric: She has a normal mood and affect. Her behavior is normal.      BP 104/67 (BP Location: Right Arm, Patient Position: Sitting, Cuff Size: Normal)   Pulse 77   Temp 98.3 F (36.8 C) (Oral)   Resp 18   Ht 5' 3.5" (1.613 m)   Wt 104 lb 12.8 oz (47.5 kg)   SpO2 99%   BMI 18.27 kg/m      Assessment & Plan:   1. Chronic pain syndrome - pt started on suboxone by psych Dr. Mallie Snooks 4 mos prior 11/10/16- dose was increased from bid to tid on 8/4. Had appt with Dr. Owens Shark on 03/01/17 - < 2 wks ago. States still has suboxone at home but req additional pain med. Explained hydrocodone will be ineffective on suboxone and she is clearly doing MUCH better on her current regimen as on her prior regimen of hydrocodone she was also complaining wkly of uncontrolled/untreated pain, insomnia, and more mood/GI sxs. Going back to hydrocodone not a viable option.  Will try nsaid -  pt has not had any GI issues is > 5 yrs.  2. Flu vaccine need   3. Hypoxia - needs 2L O2 during sleep and illness - getting home O2 sat monitor  4. Chronic abdominal pain - weight stable  5. Compression fracture of vertebra, sequela   6. Anxiety state - states uncontrolled and req to increase buspar to 30 bid - currently on 10 bid from pysch but her next appt is not for sev mos - will increase to 15 tid.  7. Opioid type dependence, continuous  ([place]) - needs to stay on suboxone. Pt w/ numerous drug-seeking behavior so UDS collected 2 wks ago when she stated her suboxone had been stopped so was req to restart hydrocodone showed not only that she was still on suboxone but ALSO on illicit hydrocodone and 2 different illicit BZDs.  8. Insomnia, unspecified type - refilled lost Belsomra  9. Poor compliance with medication - refilled lost Trilafon - pt has VERY poor vision and she loses meds all the time, then will come in w/ a large bag of duplicate meds and never has any idea what she is taking or when.  . .  10. Early onset macular degeneration   11. Legally blind    Orders Placed This Encounter  Procedures  . Flu Vaccine QUAD 36+ mos IM    Meds ordered this encounter  Medications  . busPIRone (BUSPAR) 15 MG tablet    Sig: Take 1 tablet (15 mg total) by mouth 3 (three) times daily.    Dispense:  90 tablet    Refill:  0  . BELSOMRA 20 MG TABS    Sig: Take 20 mg by mouth at bedtime.    Dispense:  30 tablet    Refill:  0  . perphenazine (TRILAFON) 4 MG tablet    Sig: Take 2 tablets (8 mg total) by mouth at bedtime.    Dispense:  60 tablet    Refill:  0     [name], M.D.  Primary Care at [facility] [address] [place], [place] [address] [phone] phone [phone] fax  03/15/17 11:19 PM

## 2017-03-13 NOTE — Patient Instructions (Addendum)
We recommend that you schedule a mammogram for breast cancer screening. Typically, you do not need a referral to do this. Please contact a local imaging center to schedule your mammogram.  [facility] - [phone]  *ask for the Radiology Department [facility]) - [phone] or [phone]  [facility] - [phone] [facility] - [phone] [facility] - [phone]  *ask for the Radiology Department [facility] - [phone]  *ask for the Radiology Department [facility] - [phone]  *ask for the Mammography Department [facility] - [phone]     IF you received an x-ray today, you will receive an invoice from [facility]. Please contact [facility] at [phone] with questions or concerns regarding your invoice.   IF you received labwork today, you will receive an invoice from [facility]. Please contact [facility] at [phone] with questions or concerns regarding your invoice.   Our billing staff will not be able to assist you with questions regarding bills from these companies.  You will be contacted with the lab results as soon as they are available. The fastest way to get your results is to activate your My Chart account. Instructions are located on the last page of this paperwork. If you have not heard from us regarding the results in 2 weeks, please contact this office.      

## 2017-03-13 NOTE — Telephone Encounter (Signed)
Pt wanted to know if she could get something for pain. She said she is having pain all over and cannot see her psychiatrist until end of October. Pt contact number is [phone]. Please advise. Thanks!

## 2017-03-15 MED ORDER — NABUMETONE 750 MG PO TABS: 750.0000 mg | ORAL_TABLET | Freq: Two times a day (BID) | ORAL | 0 refills | Status: DC | PRN

## 2017-03-15 NOTE — Telephone Encounter (Signed)
Meds ordered this encounter  Medications  . DISCONTD: CHANTIX STARTING MONTH PAK 0.5 MG X 11 & 1 MG X 42 tablet  . omeprazole (PRILOSEC) 40 MG capsule    Refill:  0  . methocarbamol (ROBAXIN) 750 MG tablet    Sig: Take 1 tablet (750 mg total) by mouth 4 (four) times daily.    Dispense:  40 tablet    Refill:  0

## 2017-03-15 NOTE — Telephone Encounter (Signed)
Pt states she has lost all of her meds.- unsure of what all she is taking asking that we look in the chart// pt only has 3 tablets left  [phone] please call

## 2017-03-15 NOTE — Telephone Encounter (Signed)
Sent med to pharmacy.  Meds ordered this encounter  Medications  . nabumetone (RELAFEN) 750 MG tablet    Sig: Take 1 tablet (750 mg total) by mouth 2 (two) times daily as needed (pain).    Dispense:  30 tablet    Refill:  0

## 2017-03-21 DIAGNOSIS — J449 Chronic obstructive pulmonary disease, unspecified: Secondary | ICD-10-CM | POA: Diagnosis not present

## 2017-03-24 ENCOUNTER — Encounter: Payer: Self-pay | Admitting: Family Medicine

## 2017-03-24 ENCOUNTER — Ambulatory Visit (INDEPENDENT_AMBULATORY_CARE_PROVIDER_SITE_OTHER): Payer: Medicare Other | Admitting: Family Medicine

## 2017-03-24 VITALS — BP 105/72 | HR 76 | Temp 98.8°F | Resp 18 | Ht 63.5 in | Wt 105.8 lb

## 2017-03-24 DIAGNOSIS — G8929 Other chronic pain: Secondary | ICD-10-CM

## 2017-03-24 DIAGNOSIS — M810 Age-related osteoporosis without current pathological fracture: Secondary | ICD-10-CM | POA: Diagnosis not present

## 2017-03-24 DIAGNOSIS — G894 Chronic pain syndrome: Secondary | ICD-10-CM | POA: Diagnosis not present

## 2017-03-24 DIAGNOSIS — R1084 Generalized abdominal pain: Secondary | ICD-10-CM | POA: Diagnosis not present

## 2017-03-24 MED ORDER — METHOCARBAMOL 750 MG PO TABS: 750.0000 mg | ORAL_TABLET | Freq: Four times a day (QID) | ORAL | 0 refills | Status: DC

## 2017-03-24 MED ORDER — TRAMADOL-ACETAMINOPHEN 37.5-325 MG PO TABS: 1.0000 | ORAL_TABLET | Freq: Two times a day (BID) | ORAL | 0 refills | Status: DC | PRN

## 2017-03-24 NOTE — Progress Notes (Signed)
Subjective:    Patient ID: [name], female    DOB: June 22, 1951, 66 y.o.   MRN: [id number] Chief Complaint  Patient presents with  . Medication check    HPI States she has used all of the medication she was given but they did help and was wanting a refill on any medications. Her next appointment with Dr. Owens Shark is in 1 wk from Sat - so probably 04/05/2017.  She states she has a lot of chronic abdominal pain that is undertreated on her current dose of suboxone.  She went to [facility] several years ago w/o a good experience. She states she ran out of suboxone strips - thinks she had some left still but not sure.Surgical changes from a prior vagotomy, partial gastrectomy and gastrojejunostomy. She has her hip pins. 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  She still has the Belsomra  She is wearing portable oxygen container.   + Osteoporosis - last DEXA bone scan 11/23/2010  Dr. Owens Shark got her meds down to 3 or 4 only - saw him on 9/8 Saturday.  She states she still has suboxone but that it is not working as well for her pain anymore and wonders if she could be something stronger.  Lately has been arguing with [name] a lot.  She bought a car -  She thinks she took her meds yesterday but not sure.   States she still has "those films that I put on my tongue for pain left but no other medications.  I think I took them yesterday but I'm not sure.  I didn't think they were helping but today I feel really funny, weird, and horrible so I guess they must have been working.  I can't fine anything but a few of those films."  Likes that she is only taking 3 or 4 meds a day now (-no sure which ones she is taking about - likely buspar, trilafon, belsomra - not sure if she is on cymbalta. Never took robaxin, lasix is rare prn, states taking prilosec daily as well as very freq prn zofran or phenergan.  Past Medical History:  Diagnosis Date  . ACUTE DUODEN ULCER W/HEMORR  PERF&OBSTRUCTION 06/26/2004   Qualifier: Diagnosis of  By: [name] MD, [name]   . Acute duodenal ulcer with hemorrhage and perforation, with obstruction ([place])   . Allergy   . Anemia   . Anginal pain ([place])    "many many years ago"  . Anxiety    sees [name] NP at Dr. Radonna Ricker office  . Arthritis    "hips" (09/06/2014)  . Asthma   . Barrett's esophagus   . Chronic abdominal pain    narcotic dependence, dr gyarteng-dak at [facility]  . 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 Complication of anesthesia    pt states had too much xanax on board - agitated   . COPD (chronic obstructive pulmonary disease) ([place])   . Depression   . Exertional shortness of breath   . FEVER, RECURRENT 08/11/2009   Qualifier: Diagnosis of  By: [name] MD, [name]   . Fx of fibula 07-28-11   left fibula, 2 places   . Fx two ribs-open 08-24-11   left 4th and 5th  . GASTRIC OUTLET OBSTRUCTION 08/28/2007   In July 2008 she underwent laparoscopic enterolysis, Nissen fundoplication over a #17 bougie, single pledgeted suture colosure of the hiatus and a 3 suture wrap.  Lap truncal vagotomy and a loop gastrojejunostomy was performed.  This was revised in August of 2009 to a roux en Y gastrojejujostomy.     . Gastroparesis 06/03/2007   Qualifier: Diagnosis of  By: [name] MD, [name]   . GERD (gastroesophageal reflux disease)   . Headache    "monthly" (09/06/2014)  . History of blood transfusion    "related to OR; maybe when I perforated that ulcer"  . History of hiatal hernia   . Lap Nissen + truncal vagotomy July 2008 05/13/2013  . LEG EDEMA 01/19/2008   Qualifier: Diagnosis of  By: [name] MD, [name]   . Menopause   . MENOPAUSE 01/07/2007   Qualifier: Diagnosis of  By: [name], CMA, [place]    . Myocardial infarction ([place])   . Narcotic abuse ([place])   . Peptic ulcer disease    dr Oletta Lamas  . Pneumonia, organism unspecified(486) 11/07/2010   Assoc with R Parapneumonic effusion 09/2010    -  Tapped 10/05/10    - CxR resolved 11/07/2010    . S/P jejunostomy Feb 2016 07/26/2014   Past Surgical History:  Procedure Laterality Date  . BIOPSY THYROID  08-13-11   benign nodule, per Dr. Melida Quitter   . CATARACT EXTRACTION W/ INTRAOCULAR LENS  IMPLANT, BILATERAL Bilateral   . COLONOSCOPY    . DILATION AND CURETTAGE OF UTERUS    . ESOPHAGOGASTRODUODENOSCOPY  12-29-09   dr qadeer at [facility], several gastric ulcers  . ESOPHAGOGASTRODUODENOSCOPY N/A 09/25/2012   Procedure: ESOPHAGOGASTRODUODENOSCOPY (EGD);  Surgeon: Beryle Beams, MD;  Location: [facility];  Service: Endoscopy;  Laterality: N/A;  . FRACTURE SURGERY    . GASTROJEJUNOSTOMY  02-20-08   Roux en Y gastrojejunsotomy w 1 foot Roux limb  . GASTROSTOMY N/A 07/26/2014   Procedure: LAPRASCOPIC ASSISTED OPEN PLACEMENT OF JEJUNOSTOMY TUBE;  Surgeon: Pedro Earls, MD;  Location: [facility];  Service: General;  Laterality: N/A;  . HERNIA REPAIR     "hiatal"  . JOINT REPLACEMENT    . LAPAROSCOPIC NISSEN FUNDOPLICATION    . LAPAROSCOPIC PARTIAL GASTRECTOMY N/A 06/29/2013   Procedure: Gastrectomy and GJ Tube placement;  Surgeon: Pedro Earls, MD;  Location: [facility];  Service: General;  Laterality: N/A;  . ORIF HIP FRACTURE Bilateral    "pins in both of them"  . REPAIR OF PERFORATED ULCER    . TONSILLECTOMY     Current Outpatient Prescriptions on File Prior to Visit  Medication Sig Dispense Refill  . albuterol (PROVENTIL HFA;VENTOLIN HFA) 108 (90 Base) MCG/ACT inhaler Inhale 2 puffs into the lungs every 4 (four) hours as needed (cough, shortness of breath or wheezing.). 1 Inhaler 3  . BELSOMRA 20 MG TABS Take 20 mg by mouth at bedtime. 30 tablet 0  . busPIRone (BUSPAR) 15 MG tablet Take 1 tablet (15 mg total) by mouth 3 (three) times daily. 90 tablet 0  . DULoxetine (CYMBALTA) 60 MG capsule TAKE (1) CAPSULE DAILY. 30 capsule 0  . furosemide (LASIX) 20 MG tablet TAKE 1 TABLET ONCE DAILY AS NEEDED FOR EDEMA. 30 tablet 0  . methocarbamol  (ROBAXIN) 750 MG tablet Take 1 tablet (750 mg total) by mouth 4 (four) times daily. 40 tablet 0  . nabumetone (RELAFEN) 750 MG tablet Take 1 tablet (750 mg total) by mouth 2 (two) times daily as needed (pain). 30 tablet 0  . omeprazole (PRILOSEC) 40 MG capsule   0  . ondansetron (ZOFRAN-ODT) 8 MG disintegrating tablet Take 1 tablet (8 mg  total) by mouth every 8 (eight) hours as needed for nausea. 20 tablet 2  . perphenazine (TRILAFON) 4 MG tablet Take 2 tablets (8 mg total) by mouth at bedtime. 60 tablet 0  . promethazine (PHENERGAN) 6.25 MG/5ML syrup TAKE 20 ML'S BY MOUTH EVERY 8 HOURS AS NEEDED FOR NAUSEA. 473 mL 0  . SUBOXONE 8-2 MG FILM Take 1 Film by mouth 2 (two) times daily.     No current facility-administered medications on file prior to visit.    Allergies  Allergen Reactions  . Lithium Nausea And Vomiting  . Celebrex [Celecoxib] Other (See Comments)    History of bleeding ulcers  . Morphine Itching  . Quetiapine Other (See Comments)     tardive dyskinesia  . Varenicline Tartrate Other (See Comments)    hallucinations   Family History  Problem Relation Age of Onset  . Cancer Mother        kidney, magliant tumor on face  . Stroke Mother   . Celiac disease Father   . Cancer Father        kidney and pancreatic   Social History   Social History  . Marital status: Divorced    Spouse name: N/A  . Number of children: N/A  . Years of education: N/A   Social History Main Topics  . Smoking status: Current Every Day Smoker    Packs/day: 2.00    Years: 50.00    Types: Cigarettes  . Smokeless tobacco: Never Used  . Alcohol use No  . Drug use: No  . Sexual activity: No   Other Topics Concern  . None   Social History Narrative  . None   Depression screen [facility] 2/9 02/26/2017 01/03/2017 11/25/2016 11/01/2016 10/17/2016  Decreased Interest 3 3 1 3  0  Down, Depressed, Hopeless 3 3 1 3  0  PHQ - 2 Score 6 6 2 6  0  Altered sleeping 3 3 1 3  -  Tired, decreased energy 3 3 1 3  -    Change in appetite 3 3 1 2  -  Feeling bad or failure about yourself  3 0 1 0 -  Trouble concentrating 3 3 1 2  -  Moving slowly or fidgety/restless 0 1 1 3  -  Suicidal thoughts 0 0 1 0 -  PHQ-9 Score 21 19 9 19  -  Difficult doing work/chores - - - Extremely dIfficult -  Some recent data might be hidden     Review of Systems  Constitutional: Positive for activity change (decrease), appetite change (decrease), fatigue and unexpected weight change (decrease). Negative for chills and fever.  Gastrointestinal: Positive for abdominal pain and nausea. Negative for constipation, diarrhea and vomiting.  Musculoskeletal: Positive for arthralgias, back pain, gait problem and myalgias.  Neurological: Positive for weakness.  Psychiatric/Behavioral: Positive for sleep disturbance.       Objective:   Physical Exam  Constitutional: She is oriented to person, place, and time. She appears well-developed and well-nourished. She appears cachectic. No distress.  HENT:  Head: Normocephalic and atraumatic.  Right Ear: External ear normal.  Left Ear: External ear normal.  Eyes: Conjunctivae are normal. No scleral icterus.  Neck: Normal range of motion. Neck supple. No thyromegaly present.  Cardiovascular: Normal rate, regular rhythm, normal heart sounds and intact distal pulses.   Pulmonary/Chest: Effort normal and breath sounds normal. No respiratory distress.  Musculoskeletal: She exhibits no edema.  Lymphadenopathy:    She has no cervical adenopathy.  Neurological: She is alert and oriented to person, place,  and time.  Skin: Skin is warm and dry. She is not diaphoretic. No erythema.  Psychiatric: She has a normal mood and affect. Her behavior is normal.      BP 105/72   Pulse 76   Temp 98.8 F (37.1 C) (Oral)   Resp 18   Ht 5' 3.5" (1.613 m)   Wt 105 lb 12.8 oz (48 kg)   SpO2 92%   BMI 18.45 kg/m      Assessment & Plan:   1. Chronic generalized abdominal pain   2. Chronic pain  syndrome  - Doing much better on suboxone than she did on hydrocodone prior - has been unable to tolerate any other type of opioid  3. Osteoporosis without current pathological fracture, unspecified osteoporosis type     Meds ordered this encounter  Medications  . methocarbamol (ROBAXIN) 750 MG tablet    Sig: Take 1 tablet (750 mg total) by mouth 4 (four) times daily.    Dispense:  40 tablet    Refill:  0  . traMADol-acetaminophen (ULTRACET) 37.5-325 MG tablet    Sig: Take 1 tablet by mouth every 12 (twelve) hours as needed. Do not take more than prescribed - can cause seizure or coma.    Dispense:  20 tablet    Refill:  0    [name], M.D.  Primary Care at [facility] [address] [place], [place] [address] [phone] phone [phone] fax  03/24/17 3:36 PM

## 2017-03-24 NOTE — Patient Instructions (Addendum)
We recommend that you schedule a mammogram for breast cancer screening and your DEXA BONE SCAN to check on your osteoporosis.  You need to schedule your mammogram and your DEXA bone scan.  Please call [facility] at [phone] to schedule.  [facility] - [phone] or [phone]    IF you received an x-ray today, you will receive an invoice from [facility]. Please contact [facility] at [phone] with questions or concerns regarding your invoice.   IF you received labwork today, you will receive an invoice from [facility]. Please contact [facility] at [phone] with questions or concerns regarding your invoice.   Our billing staff will not be able to assist you with questions regarding bills from these companies.  You will be contacted with the lab results as soon as they are available. The fastest way to get your results is to activate your My Chart account. Instructions are located on the last page of this paperwork. If you have not heard from [place] regarding the results in 2 weeks, please contact this office.     I will refer you to [facility].  If you don't hear from them within 1-2 weeks, please call us.

## 2017-03-28 ENCOUNTER — Telehealth: Payer: Self-pay

## 2017-04-18 MED FILL — SUBOXONE 8 MG-2 MG SL FILM: 8-2 | 30 days supply | Qty: 90 | Fill #0

## 2017-04-20 DIAGNOSIS — J449 Chronic obstructive pulmonary disease, unspecified: Secondary | ICD-10-CM | POA: Diagnosis not present

## 2017-04-26 ENCOUNTER — Encounter: Payer: Self-pay | Admitting: Family Medicine

## 2017-04-26 ENCOUNTER — Ambulatory Visit: Payer: Medicare Other | Admitting: Family Medicine

## 2017-04-26 ENCOUNTER — Ambulatory Visit (INDEPENDENT_AMBULATORY_CARE_PROVIDER_SITE_OTHER): Payer: Medicare Other | Admitting: Family Medicine

## 2017-04-26 VITALS — BP 122/76 | HR 77 | Temp 98.5°F | Resp 18 | Ht 63.5 in | Wt 105.8 lb

## 2017-04-26 DIAGNOSIS — G894 Chronic pain syndrome: Secondary | ICD-10-CM | POA: Diagnosis not present

## 2017-04-26 DIAGNOSIS — G4700 Insomnia, unspecified: Secondary | ICD-10-CM

## 2017-04-26 DIAGNOSIS — R1909 Other intra-abdominal and pelvic swelling, mass and lump: Secondary | ICD-10-CM

## 2017-04-26 DIAGNOSIS — R109 Unspecified abdominal pain: Secondary | ICD-10-CM | POA: Diagnosis not present

## 2017-04-26 DIAGNOSIS — F1120 Opioid dependence, uncomplicated: Secondary | ICD-10-CM | POA: Diagnosis not present

## 2017-04-26 DIAGNOSIS — C4491 Basal cell carcinoma of skin, unspecified: Secondary | ICD-10-CM | POA: Diagnosis not present

## 2017-04-26 DIAGNOSIS — C44519 Basal cell carcinoma of skin of other part of trunk: Secondary | ICD-10-CM

## 2017-04-26 DIAGNOSIS — R229 Localized swelling, mass and lump, unspecified: Secondary | ICD-10-CM

## 2017-04-26 DIAGNOSIS — G8929 Other chronic pain: Secondary | ICD-10-CM

## 2017-04-26 NOTE — Patient Instructions (Addendum)
Ask the pharmacist what small round white sleeping pill in a bottle that I have prescribed you before and have them send a request to me. I wonder if perhaps we should transition your pain management to either the [facility] pain clinic in [place] or the [facility] clinic - let me send your chart places and see what your insurance will cover.  [facility] - Dr. Lillia Corporal [address] [place], [place] [address] Phone: [phone]  [facility] at [place], [place], [place] [address] Ph: [phone]   IF you received an x-ray today, you will receive an invoice from [facility]. Please contact [facility] at [phone] with questions or concerns regarding your invoice.   IF you received labwork today, you will receive an invoice from [facility]. Please contact [facility] at [phone] with questions or concerns regarding your invoice.   Our billing staff will not be able to assist you with questions regarding bills from these companies.  You will be contacted with the lab results as soon as they are available. The fastest way to get your results is to activate your My Chart account. Instructions are located on the last page of this paperwork. If you have not heard from [place] regarding the results in 2 weeks, please contact this office.     Skin Biopsy, Care After Refer to this sheet in the next few weeks. These instructions provide you with information about caring for yourself after your procedure. Your health care provider may also give you more specific instructions. Your treatment has been planned according to current medical practices, but problems sometimes occur. Call your health care provider if you have any problems or questions after your procedure. What can I expect after the procedure? After the procedure, it is common to have:  Soreness.  Bruising.  Itching.  Follow these  instructions at home:  Rest and then return to your normal activities as told by your health care provider.  Take over-the-counter and prescription medicines only as told by your health care provider.  Follow instructions from your health care provider about how to take care of your biopsy site.Make sure you: ? Wash your hands with soap and water before you change your bandage (dressing). If soap and water are not available, use hand sanitizer. ? Change your dressing as told by your health care provider. ? Leave stitches (sutures), skin glue, or adhesive strips in place. These skin closures may need to stay in place for 2 weeks or longer. If adhesive strip edges start to loosen and curl up, you may trim the loose edges. Do not remove adhesive strips completely unless your health care provider tells you to do that. If the biopsy area bleeds, apply gentle pressure for 10 minutes.  Check your biopsy site every day for signs of infection. Check for: ? More redness, swelling, or pain. ? More fluid or blood. ? Warmth. ? Pus or a bad smell.  Keep all follow-up visits as told by your health care provider. This is important. Contact a health care provider if:  You have more redness, swelling, or pain around your biopsy site.  You have more fluid or blood coming from your biopsy site.  Your biopsy site feels warm to the touch.  You have pus or a bad smell coming from your biopsy site.  You have a fever. Get help right away if:  You have bleeding that does not stop with pressure  or a dressing. This information is not intended to replace advice given to you by your health care provider. Make sure you discuss any questions you have with your health care provider. Document Released: 07/07/2015 Document Revised: 02/04/2016 Document Reviewed: 09/07/2014 Elsevier Interactive Patient Education  [employer].

## 2017-04-26 NOTE — Progress Notes (Addendum)
Subjective:  By signing my name below, I, [name], attest that this documentation has been prepared under the direction and in the presence of [name], MD. Electronically Signed: [name], [place]. 04/26/2017 , 2:18 PM .  Patient was seen in Room 3 .   Patient ID: [name], female    DOB: Jan 07, 1951, 66 y.o.   MRN: [id number] Chief Complaint  Patient presents with  . Abcess    x1 week, pt states it looked like a bump and then it grew fast with no discharge.   HPI [name] is a 66 y.o. female who presents to Primary Care at [facility] complaining of a bump growing over her left upper leg noticed a week ago. She denies any rash around the area.   Past Medical History:  Diagnosis Date  . ACUTE DUODEN ULCER W/HEMORR PERF&OBSTRUCTION 06/26/2004   Qualifier: Diagnosis of  By: [name] MD, [name]   . Acute duodenal ulcer with hemorrhage and perforation, with obstruction ([place])   . Allergy   . Anemia   . Anginal pain ([place])    "many many years ago"  . Anxiety    sees [name] NP at Dr. Radonna Ricker office  . Arthritis    "hips" (09/06/2014)  . Asthma   . Barrett's esophagus   . Chronic abdominal pain    narcotic dependence, dr gyarteng-dak at [facility]  . 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 Complication of anesthesia    pt states had too much xanax on board - agitated   . COPD (chronic obstructive pulmonary disease) ([place])   . Depression   . Exertional shortness of breath   . FEVER, RECURRENT 08/11/2009   Qualifier: Diagnosis of  By: [name] MD, [name]   . Fx of fibula 07-28-11   left fibula, 2 places   . Fx two ribs-open 08-24-11   left 4th and 5th  . GASTRIC OUTLET OBSTRUCTION 08/28/2007   In July 2008 she underwent laparoscopic enterolysis, Nissen fundoplication over a #29 bougie, single pledgeted suture colosure of the hiatus and a 3 suture wrap.  Lap truncal vagotomy and a loop gastrojejunostomy was performed.  This was revised  in August of 2009 to a roux en Y gastrojejujostomy.     . Gastroparesis 06/03/2007   Qualifier: Diagnosis of  By: [name] MD, [name]   . GERD (gastroesophageal reflux disease)   . Headache    "monthly" (09/06/2014)  . History of blood transfusion    "related to OR; maybe when I perforated that ulcer"  . History of hiatal hernia   . Lap Nissen + truncal vagotomy July 2008 05/13/2013  . LEG EDEMA 01/19/2008   Qualifier: Diagnosis of  By: [name] MD, [name]   . Menopause   . MENOPAUSE 01/07/2007   Qualifier: Diagnosis of  By: [name], CMA, [place]    . Myocardial infarction ([place])   . Narcotic abuse ([place])   . Peptic ulcer disease    dr Oletta Lamas  . Pneumonia, organism unspecified(486) 11/07/2010   Assoc with R Parapneumonic effusion 09/2010    - Tapped 10/05/10    - CxR resolved 11/07/2010    . 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 Authorizing Provider  albuterol (PROVENTIL HFA;VENTOLIN HFA) 108 (90 Base) MCG/ACT inhaler Inhale 2 puffs into the lungs every 4 (four) hours as needed (cough, shortness of breath or wheezing.).  08/01/15  Yes Shawnee Knapp, MD  BELSOMRA 20 MG TABS Take 20 mg by mouth at bedtime. 03/13/17  Yes Shawnee Knapp, MD  busPIRone (BUSPAR) 15 MG tablet Take 1 tablet (15 mg total) by mouth 3 (three) times daily. 03/13/17  Yes Shawnee Knapp, MD  Cariprazine HCl (VRAYLAR) 3 MG CAPS Take by mouth.   Yes [provider]  DULoxetine (CYMBALTA) 60 MG capsule TAKE (1) CAPSULE DAILY. 09/12/16  Yes Shawnee Knapp, MD  furosemide (LASIX) 20 MG tablet TAKE 1 TABLET ONCE DAILY AS NEEDED FOR EDEMA. 08/30/16  Yes Shawnee Knapp, MD  methocarbamol (ROBAXIN) 750 MG tablet Take 1 tablet (750 mg total) by mouth 4 (four) times daily. 03/24/17  Yes Shawnee Knapp, MD  nabumetone (RELAFEN) 750 MG tablet Take 1 tablet (750 mg total) by mouth 2 (two) times daily as needed (pain). 03/15/17  Yes Shawnee Knapp, MD  omeprazole (PRILOSEC) 40 MG capsule   01/13/17  Yes [provider]  ondansetron (ZOFRAN-ODT) 8 MG disintegrating tablet Take 1 tablet (8 mg total) by mouth every 8 (eight) hours as needed for nausea. 02/26/17  Yes Shawnee Knapp, MD  perphenazine (TRILAFON) 4 MG tablet Take 2 tablets (8 mg total) by mouth at bedtime. 03/13/17  Yes Shawnee Knapp, MD  promethazine (PHENERGAN) 6.25 MG/5ML syrup TAKE 20 ML'S BY MOUTH EVERY 8 HOURS AS NEEDED FOR NAUSEA. 02/16/17  Yes Shawnee Knapp, MD  SUBOXONE 8-2 MG FILM Take 1 Film by mouth 2 (two) times daily. 11/10/16  Yes [provider]  traMADol-acetaminophen (ULTRACET) 37.5-325 MG tablet Take 1 tablet by mouth every 12 (twelve) hours as needed. Do not take more than prescribed - can cause seizure or coma. 03/24/17  Yes Shawnee Knapp, MD   Allergies  Allergen Reactions  . Lithium Nausea And Vomiting  . Celebrex [Celecoxib] Other (See Comments)    History of bleeding ulcers  . Morphine Itching  . Quetiapine Other (See Comments)     tardive dyskinesia  . Varenicline Tartrate Other (See Comments)    hallucinations   Past Surgical History:  Procedure Laterality Date  . BIOPSY THYROID  08-13-11   benign nodule, per Dr. Melida Quitter   . CATARACT EXTRACTION W/ INTRAOCULAR LENS  IMPLANT, BILATERAL Bilateral   . COLONOSCOPY    . DILATION AND CURETTAGE OF UTERUS    . ESOPHAGOGASTRODUODENOSCOPY  12-29-09   dr qadeer at [facility], several gastric ulcers  . FRACTURE SURGERY    . GASTROJEJUNOSTOMY  02-20-08   Roux en Y gastrojejunsotomy w 1 foot Roux limb  . HERNIA REPAIR     "hiatal"  . JOINT REPLACEMENT    . LAPAROSCOPIC NISSEN FUNDOPLICATION    . ORIF HIP FRACTURE Bilateral    "pins in both of them"  . REPAIR OF PERFORATED ULCER    . TONSILLECTOMY     Family History  Problem Relation Age of Onset  . Cancer Mother        kidney, magliant tumor on face  . Stroke Mother   . Celiac disease Father   . Cancer Father        kidney and pancreatic   Social History   Socioeconomic History    . Marital status: Divorced    Spouse name: None  . Number of children: None  . Years of education: None  . Highest education level: None  Social Needs  . Financial resource strain: None  . Food insecurity - worry:  None  . Food insecurity - inability: None  . Transportation needs - medical: None  . Transportation needs - non-medical: None  Occupational History  . None  Tobacco Use  . Smoking status: Current Every Day Smoker    Packs/day: 2.00    Years: 50.00    Pack years: 100.00    Types: Cigarettes  . Smokeless tobacco: Never Used  Substance and Sexual Activity  . Alcohol use: No    Alcohol/week: 0.0 oz  . Drug use: No  . Sexual activity: No  Other Topics Concern  . None  Social History Narrative  . None   Depression screen [facility] 2/9 04/26/2017 02/26/2017 01/03/2017 11/25/2016 11/01/2016  Decreased Interest 0 3 3 1 3   Down, Depressed, Hopeless 0 3 3 1 3   PHQ - 2 Score 0 6 6 2 6   Altered sleeping - 3 3 1 3   Tired, decreased energy - 3 3 1 3   Change in appetite - 3 3 1 2   Feeling bad or failure about yourself  - 3 0 1 0  Trouble concentrating - 3 3 1 2   Moving slowly or fidgety/restless - 0 1 1 3   Suicidal thoughts - 0 0 1 0  PHQ-9 Score - 21 19 9 19   Difficult doing work/chores - - - - Extremely dIfficult  Some recent data might be hidden    Review of Systems  Constitutional: Negative for chills, fatigue, fever and unexpected weight change.  Respiratory: Negative for cough.   Gastrointestinal: Negative for constipation, diarrhea, nausea and vomiting.  Skin: Negative for rash and wound.  Neurological: Negative for dizziness, weakness and headaches.       Objective:   Physical Exam  Constitutional: She is oriented to person, place, and time. She appears well-developed and well-nourished. No distress.  HENT:  Head: Normocephalic and atraumatic.  Eyes: Pupils are equal, round, and reactive to light. EOM are normal.  Neck: Neck supple.  Cardiovascular: Normal rate.    Pulmonary/Chest: Effort normal. No respiratory distress.  Musculoskeletal: Normal range of motion.  Neurological: She is alert and oriented to person, place, and time.  Skin: Skin is warm and dry.     3x71mm nodule with ulcerated top at the left suprapubic area near inguinal ridge.  Psychiatric: She has a normal mood and affect. Her behavior is normal.  Nursing note and vitals reviewed.   BP 122/76 (BP Location: Left Arm, Patient Position: Sitting, Cuff Size: Normal)   Pulse 77   Temp 98.5 F (36.9 C) (Oral)   Resp 18   Ht 5' 3.5" (1.613 m)   Wt 105 lb 12.8 oz (48 kg)   SpO2 96%   BMI 18.45 kg/m     Risks and benefits reviewed, and verbal informed consent obtained. Cleansed with alcohol 3. Anesthesia with 3cc subcutaneous administration of 1% lidocaine with epinephrine. Shave biopsy obtained using dermablade to site approximately 3-4 mm diameter. Hemostasis achieved with pressure and Drysol application. Biopsy site dressed with mupirocin in a pressure bandage. EBL less than 1 mL. No complications. Patient tolerated procedure well. Wound care reviewed in detail and all questions answered.  Assessment & Plan:   1. Erythematous skin nodule - suspect benign but very irritated - removed by shave biopsy. Path P.  2. Chronic pain syndrome - on suboxone - from my point of view, seems to have better control than when she was on hydrocodone but pt not sure she agrees w/ this.  3. Opioid type dependence, continuous ([place]) -  records were sent to [facility] last mo - contact info given in case pt wants to try to make appt herself. Or could try [facility] - they rx suboxone and treat mental health so could help w/ her depression and sleep potentially.  4. Insomnia, unspecified type - asks for something for sleep - this is currently being treated by her psych Dr. Owens Shark at [facility] but states she hasn't seen him in a while and appt isn't for some time (he only works  there 2 Sat/mo and so can't even get phone call feedback between appts really). Reminded [name] that the reason she is seeing him is because we tried numerous things for sleep and none worked (there is a list somewhere in a prior note. . .) - she states there was one med which worked but doesn't remember what it was - I encouraged her to see if her pharmacist at [facility] can help identify then sent me a refill req. Pt states xanax worked but knows she can't be on that since on narcotics (and when she was, she was still c/o insomnia freq)  5. Chronic abdominal pain - pt having trouble since her current psychiatrist who rx's her suboxone and sleep meds only works at his office 2x/mo so can't get in for appt or even phone call feedback for when she is having trouble so though loves her psych, may have to establish elsewhere - she needs to call her insurance and see who is on her plan - can be difficult to find psych taking new medicare pts.   ADDENDUM: path returned showing ULCERATED BASAL CELL CARCINOMA, INFILTRATIVE PATTERN -  Microscopic description:  There are irregular aggregations of basaloid cells with jagged borders that are encased in a fibrotic stroma. There is ulceration of the epidermis in these sections and an inflammatory infiltrate in the ulcer base.  I am concerned she will need repeat excision of the area since it notes that the infiltrate is seen in the ulcer base so will refer to derm - fortunately was VERY small area so if pt has strong preference, would likely be fine to have done at our office.  Meds ordered this encounter  Medications  . Cariprazine HCl (VRAYLAR) 3 MG CAPS    Sig: Take by mouth.    I personally performed the services described in this documentation, which was scribed in my presence. The recorded information has been reviewed and considered, and addended by me as needed.   [name], M.D.  Primary Care at [facility] [address] [place], [place]  [address] [phone] phone [phone] fax  04/29/17 1:57 PM

## 2017-04-29 ENCOUNTER — Other Ambulatory Visit: Payer: Self-pay

## 2017-04-29 ENCOUNTER — Other Ambulatory Visit: Payer: Self-pay | Admitting: Family Medicine

## 2017-04-29 NOTE — Addendum Note (Signed)
Addended by: Shawnee Knapp on: 04/29/2017 09:31 PM   Modules accepted: Orders

## 2017-04-30 ENCOUNTER — Other Ambulatory Visit: Payer: Self-pay | Admitting: *Deleted

## 2017-04-30 ENCOUNTER — Ambulatory Visit: Payer: Medicare Other | Admitting: Family Medicine

## 2017-04-30 NOTE — Telephone Encounter (Signed)
Rx for [employer]

## 2017-04-30 NOTE — Patient Outreach (Signed)
[facility]) Care Management  04/30/2017  [name] [birth year] [id number]  Telephone Screen  Referral Date: 04/29/17 Referral Source: EMMI Prevent Referral Reason: COPD Insurance: [facility] Medicare  Outreach telephone to patient. HIPAA verified with patient. Patient described her health as poor due to her medical comorbidities. Patient confirmed having a history of COPD. Patient reported, she continues to smoke one pack per day, although she is oxygen dependent. Patient stated, her cigarette smoking has improved/decreased. She has a nebulizer machine to use as needed. She hasn't used the nebulizer in a while, per patient. Patient reported, she has a history of gastroparesis, not related to DM. She recently had a benign growth removed from her left hip by her primary MD. Patient verbalized seeing her primary MD weekly for monitoring of the growth. Patient reported, her vision is poor and she is in the process of getting glasses. Patient has a history of substance abuse. She is active with a Psychiatrist. Patient stated, "She runs out of her psyche medications every 6 to 8 weeks". She stated, the Psychiatrist refuses to fill her medications during that time frame. RN CM asked patient what causes her to be out of medications prior to the next refill. Patient responded, "She doesn't know". Patient reported, she lives with a roommate, who assist with medical transportation at times. Patient reported, she doesn't have a stove or refrigerator in her home. "She eats and warm her food the best that she can". [facility] services and benefits explained to patient. Patient agreed to services.     Plan: RN CM advised patient to contact RNCM for any needs or concerns. RN CM will send referral to [facility] for possible assistance with community resources.  RN CM will send referral to [facility] for further education and support in managing COPD.  [place], RN, BSN, MHA/MSL, [place] Telephonic Care  Manager Coordinator [facility] Direct Phone: [phone] Toll Free: [phone] Fax: [phone]

## 2017-05-01 ENCOUNTER — Encounter: Payer: Self-pay | Admitting: *Deleted

## 2017-05-01 ENCOUNTER — Other Ambulatory Visit: Payer: Self-pay | Admitting: *Deleted

## 2017-05-01 NOTE — Patient Outreach (Addendum)
[facility]) Care Management  05/01/2017  JASMON MATTICE 08-14-1950 [id number]   CSW was able to make initial contact with patient today to perform phone assessment, as well as assess and assist with social work needs and services.  CSW introduced self, explained role and types of services provided through [place] [facility] ([place] [facility]).  CSW further explained to patient that CSW works with patient's Telephonic RNCM, also with [place] [facility], Juanita Futrell. CSW then explained the reason for the call, indicating that Mrs. Futrell thought that patient would benefit from social work services and resources to assist with obtaining a refrigerator and an oven/stove.  CSW obtained two HIPAA compliant identifiers from patient, which included patient's name and date of birth. 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  Patient then went on to say that her aunt recently past away and left her with a "large trust fund", in addition to a townhouse that is completely paid for.  Patient indicated that she plans to see her attorney in the morning to access money to purchase the needed appliances.  Patient admitted that she and her roommate eat take-out food for every meal, "when money should have been put aside to purchase the appliances".  Patient is confident that she will have the money to purchase the appliances within the next week. CSW will perform a case closure on patient, as all goals of treatment have been met from social work standpoint and no additional social work needs have been identified at this time.  CSW will notify patient's Telephonic RNCM  with [place] [facility], [place] of CSW's plans to close patient's case.  CSW will fax an update to patient's Primary Care Physician, Dr. [name] to ensure that they are aware of CSW's involvement with patient's plan of care.  CSW will submit a case closure request to Alycia Rossetti, Care Management Assistant with [place] [facility], in the form of an [employer].  CSW will ensure that Mrs. [name] is aware of Mrs. Futrell's, Telephonic RNCM with [place] [facility], continued involvement with patient's care. [name], BSW, MSW, LCSW  Licensed Education officer, [facility]  Mailing [place] [address], [place], [place] [address] Physical Address-[address], [place], [place] [address] Toll Free Main # [phone] Fax # [phone] Cell # [phone]  Office # [phone] [email or web address]

## 2017-05-02 ENCOUNTER — Other Ambulatory Visit: Payer: Self-pay

## 2017-05-02 NOTE — Patient Outreach (Signed)
[facility]) Care Management  05/02/2017  [name] 26-Feb-1951 [id number]   RN Health Coach called and spoke with the patient for an introduction.  HIPAA verified. The patient voiced that she is interested in the program and would like to be coached about her condition.  She states that she is doing well today. She is breathing fine and wearing her oxygen on 3 liters. She has an appointment with her primary care office tomorrow for a follow up.  Plan: RN Health Coach will contact patient in the month of November and patient agrees to next outreach.  [name] RN, BSN, [place] Direct Dial:  [phone] Fax: [phone]

## 2017-05-03 ENCOUNTER — Encounter: Payer: Self-pay | Admitting: Urgent Care

## 2017-05-03 ENCOUNTER — Ambulatory Visit (INDEPENDENT_AMBULATORY_CARE_PROVIDER_SITE_OTHER): Payer: Medicare Other | Admitting: Urgent Care

## 2017-05-03 VITALS — BP 107/71 | HR 71 | Temp 98.3°F | Resp 18 | Ht 63.5 in | Wt 104.4 lb

## 2017-05-03 DIAGNOSIS — C44519 Basal cell carcinoma of skin of other part of trunk: Secondary | ICD-10-CM | POA: Diagnosis not present

## 2017-05-03 NOTE — Progress Notes (Signed)
   MRN: [id number] DOB: 23-Oct-1950  Subjective:   [name] is a 66 y.o. female presenting for follow up on skin biopsy results. Counseled patient that she has basal cell carcinoma. She states that she has had this before. Reported findings that margins may not have been cleared. Patient verbalized understanding.   [name] has a current medication list which includes the following prescription(s): albuterol, belsomra, buspirone, cariprazine hcl, duloxetine, furosemide, methocarbamol, nabumetone, omeprazole, ondansetron, perphenazine, promethazine, suboxone, and tramadol-acetaminophen. Also is allergic to lithium; celebrex [celecoxib]; morphine; quetiapine; and varenicline tartrate.  [name]  has a past medical history of ACUTE DUODEN ULCER W/HEMORR PERF&OBSTRUCTION (06/26/2004), Acute duodenal ulcer with hemorrhage and perforation, with obstruction ([place]), Allergy, Anemia, Anginal pain ([place]), Anxiety, Arthritis, Asthma, Barrett's esophagus, Chronic abdominal pain, Chronic duodenal ulcer with gastric outlet obstruction (07/29/3662), Complication of anesthesia, COPD (chronic obstructive pulmonary disease) ([place]), Depression, Exertional shortness of breath, FEVER, RECURRENT (08/11/2009), Fx of fibula (07-28-11), Fx two ribs-open (08-24-11), GASTRIC OUTLET OBSTRUCTION (08/28/2007), Gastroparesis (06/03/2007), GERD (gastroesophageal reflux disease), Headache, History of blood transfusion, History of hiatal hernia, Lap Nissen + truncal vagotomy July 2008 (05/13/2013), LEG EDEMA (01/19/2008), Menopause, MENOPAUSE (01/07/2007), Myocardial infarction [facility]), Narcotic abuse ([place]), Peptic ulcer disease, Pneumonia, organism unspecified(486) (11/07/2010), and S/P jejunostomy Feb 2016 (07/26/2014). Also  has a past surgical history that includes Esophagogastroduodenoscopy (12-29-09); Laparoscopic Nissen fundoplication; Gastrojejunostomy (02-20-08); Repair of perforated ulcer; Biopsy thyroid (08-13-11); Fracture surgery; Tonsillectomy; Cataract  extraction w/ intraocular lens  implant, bilateral (Bilateral); Colonoscopy; ORIF hip fracture (Bilateral); Dilation and curettage of uterus; Hernia repair; Joint replacement; LAPRASCOPIC ASSISTED OPEN PLACEMENT OF JEJUNOSTOMY TUBE (N/A, 07/26/2014); Gastrectomy and GJ Tube placement (N/A, 06/29/2013); and ESOPHAGOGASTRODUODENOSCOPY (EGD) (N/A, 09/25/2012).  Objective:   Vitals: BP 107/71   Pulse 71   Temp 98.3 F (36.8 C) (Oral)   Resp 18   Ht 5' 3.5" (1.613 m)   Wt 104 lb 6.4 oz (47.4 kg)   SpO2 93%   BMI 18.20 kg/m   Physical Exam  Constitutional: She is oriented to person, place, and time. She appears well-developed and well-nourished.  Cardiovascular: Normal rate.  Pulmonary/Chest: Effort normal.  Neurological: She is alert and oriented to person, place, and time.  Skin:       Assessment and Plan :   1. Basal cell carcinoma (BCC) of skin of other part of torso - Patient presented with her partner today, we discussed results and I offered her repeat excision. Patient was not comfortable with having this procedure done again in our office and insisted on going to a dermatologist. Referral is pending. - Ambulatory referral to Dermatology   Jaynee Eagles, PA-C [facility] [phone] 05/03/2017 10:36 AM

## 2017-05-03 NOTE — Patient Instructions (Addendum)
Basal Cell Carcinoma Basal cell carcinoma is the most common form of skin cancer. It begins in the basal cells, which are at the bottom of the outer skin layer (epidermis). It occurs most often on parts of the body that are frequently exposed to the sun, such as:  Parts of the head, including the scalp or face.  Ears.  Neck.  Arms or legs.  Backs of the hands.  Basal cell carcinoma can almost always be cured. It rarely spreads to other areas of the body (metastasizes). Basal cell carcinoma may come back at the same location (recur), but it can be treated again if this occurs. What are the causes? This condition is usually caused by exposure to ultraviolet (UV) light. UV light may come from the sun or from tanning beds. Other causes include:  Exposure to arsenic.  Exposure to radiation.  Exposure to toxic tars and oils.  Certain genetic conditions, such as xeroderma pigmentosum.  What increases the risk? This condition is more likely to develop in:  People who are older than 65 years of age.  People who have fair skin (light complexion).  People who have blonde or red hair.  People who have blue, green, or gray eyes.  People who have childhood freckling.  People who have had sun exposure over long periods of time, especially during childhood.  People who have had repeated sunburns.  People who have a weakened immune system.  People who have been exposed to certain chemicals, such as tar, soot, and arsenic.  People who have chronic inflammatory conditions.  People who have chronic infections.  People who use tanning beds.  What are the signs or symptoms? The main symptom of this condition is a growth or lesion on the skin. The shape and color of the growth or lesion may vary. The five main types include:  An open sore that may remain open for 3 weeks or longer. The sore may bleed or crust. This type of lesion can be an early sign of basal cell carcinoma. Basal  cell carcinoma often shows up as a sore that does not heal.  A reddish area that may crust, itch, or cause discomfort. This may occur on areas that are exposed to the sun. These patches might be easier to feel than to see.  A shiny or clear bump that is red, white, or pink. In people who have dark hair, the bump is often tan, black, or brown. These bumps can look like moles.  A pink growth with a raised border. The growth will have a crusted and indented area in the center. Small blood vessels may appear on the surface of the growth as it gets bigger.  A scarlike area that looks like shiny, stretched skin. The area may be white, yellow, or waxy. It often has irregular borders. This may be a sign of more aggressive basal cell carcinoma.  How is this diagnosed? This condition may be diagnosed with:  A physical exam.  Removal of a tissue sample to be examined under a microscope (biopsy).  How is this treated? Treatment for this condition involves removing the cancerous tissue. The method that is used for this depends on the type, size, location, and number of tumors. Possible treatments include:  Mohs surgery. In this procedure, the cancerous skin cells are removed layer by layer until all of the tumor has been removed.  Surgical removal (excision) of the tumor. This involves removing the entire tumor and a small amount of   normal skin that surrounds it.  Cryosurgery. This involves freezing the tumor with liquid nitrogen.  Plastic surgery. The tumor is removed, and healthy skin from another part of the body is used to cover the wound. This may be done for large tumors that are in areas where it is not possible to stretch the nearby skin to sew the edges of the wound together.  Radiation. This may be used for tumors on the face.  Photodynamic therapy. A chemical cream is applied to the skin, and light exposure is used to activate the chemical.  Electrodesiccation and curettage. This  involves alternately scraping and burning the tumor while using an electric current to control bleeding.  Chemical treatments, such as imiquimod cream and interferon injections. These may be used to remove superficial tumors with minimal scarring.  Follow these instructions at home:  Avoid unprotected sun exposure.  Do self-exams as told by your health care provider. Look for new spots or changes in your skin.  Keep all follow-up visits as told by your health care provider. This is important. How is this prevented?  Avoid the sun when it is the strongest. This is usually between 10:00 a.m. and 4:00 p.m.  When you are out in the sun, use a sunscreen that has a sun protection factor (SPF) of at least 57.  Apply sunscreen at least 30 minutes before exposure to the sun.  Reapply sunscreen every 2-4 hours while you are outside. Also reapply it after swimming and after excessive sweating.  Always wear hats, protective clothing, and UV-blocking sunglasses when you are outdoors.  Do not use tanning beds. Contact a health care provider if:  You notice any new spots or any changes in your skin.  You have had a basal cell carcinoma tumor removed and you notice a new growth in the same location. This information is not intended to replace advice given to you by your health care provider. Make sure you discuss any questions you have with your health care provider. Document Released: 12/15/2002 Document Revised: 11/16/2015 Document Reviewed: 10/03/2014 Elsevier Interactive Patient Education  2018 [employer].     IF you received an x-ray today, you will receive an invoice from [facility]. Please contact [facility] at [phone] with questions or concerns regarding your invoice.   IF you received labwork today, you will receive an invoice from [facility]. Please contact [facility] at [phone] with questions or concerns regarding your invoice.   Our billing staff will  not be able to assist you with questions regarding bills from these companies.  You will be contacted with the lab results as soon as they are available. The fastest way to get your results is to activate your My Chart account. Instructions are located on the last page of this paperwork. If you have not heard from [place] regarding the results in 2 weeks, please contact this office.

## 2017-05-05 ENCOUNTER — Telehealth: Payer: Self-pay | Admitting: Family Medicine

## 2017-05-05 NOTE — Telephone Encounter (Signed)
Pt had urgent referral in for Dermatology. I was able to get the pt scheduled at [facility] with [place] for 05/06/17. I called the pt to let her know this but she stated she preferred to come here and have Dr. Brigitte Pulse perform repeat excision. Pt has been scheduled to see Dr. Brigitte Pulse on 05/10/17 and appt at [facility] has been canceled. Thanks.

## 2017-05-06 NOTE — Telephone Encounter (Signed)
Sounds good. Thank you

## 2017-05-10 ENCOUNTER — Encounter: Payer: Self-pay | Admitting: Family Medicine

## 2017-05-10 ENCOUNTER — Ambulatory Visit (INDEPENDENT_AMBULATORY_CARE_PROVIDER_SITE_OTHER): Payer: Medicare Other | Admitting: Family Medicine

## 2017-05-10 VITALS — BP 110/71 | HR 94 | Temp 98.4°F | Resp 16 | Ht 63.5 in | Wt 103.8 lb

## 2017-05-10 DIAGNOSIS — C44519 Basal cell carcinoma of skin of other part of trunk: Secondary | ICD-10-CM

## 2017-05-10 DIAGNOSIS — L859 Epidermal thickening, unspecified: Secondary | ICD-10-CM | POA: Diagnosis not present

## 2017-05-10 MED ORDER — HYDROCODONE-ACETAMINOPHEN 10-325 MG PO TABS: 1.0000 | ORAL_TABLET | Freq: Three times a day (TID) | ORAL | 0 refills | Status: DC | PRN

## 2017-05-10 NOTE — Patient Instructions (Addendum)
     IF you received an x-ray today, you will receive an invoice from [facility]. Please contact [facility] at [phone] with questions or concerns regarding your invoice.   IF you received labwork today, you will receive an invoice from [facility]. Please contact [facility] at [phone] with questions or concerns regarding your invoice.   Our billing staff will not be able to assist you with questions regarding bills from these companies.  You will be contacted with the lab results as soon as they are available. The fastest way to get your results is to activate your My Chart account. Instructions are located on the last page of this paperwork. If you have not heard from us regarding the results in 2 weeks, please contact this office.      Skin Biopsy, Care After Refer to this sheet in the next few weeks. These instructions provide you with information about caring for yourself after your procedure. Your health care provider may also give you more specific instructions. Your treatment has been planned according to current medical practices, but problems sometimes occur. Call your health care provider if you have any problems or questions after your procedure. What can I expect after the procedure? After the procedure, it is common to have:  Soreness.  Bruising.  Itching.  Follow these instructions at home:  Rest and then return to your normal activities as told by your health care provider.  Take over-the-counter and prescription medicines only as told by your health care provider.  Follow instructions from your health care provider about how to take care of your biopsy site.Make sure you: ? Wash your hands with soap and water before you change your bandage (dressing). If soap and water are not available, use hand sanitizer. ? Change your dressing as told by your health care provider. ? Leave stitches (sutures), skin glue, or adhesive strips in place. These  skin closures may need to stay in place for 2 weeks or longer. If adhesive strip edges start to loosen and curl up, you may trim the loose edges. Do not remove adhesive strips completely unless your health care provider tells you to do that. If the biopsy area bleeds, apply gentle pressure for 10 minutes.  Check your biopsy site every day for signs of infection. Check for: ? More redness, swelling, or pain. ? More fluid or blood. ? Warmth. ? Pus or a bad smell.  Keep all follow-up visits as told by your health care provider. This is important. Contact a health care provider if:  You have more redness, swelling, or pain around your biopsy site.  You have more fluid or blood coming from your biopsy site.  Your biopsy site feels warm to the touch.  You have pus or a bad smell coming from your biopsy site.  You have a fever. Get help right away if:  You have bleeding that does not stop with pressure or a dressing. This information is not intended to replace advice given to you by your health care provider. Make sure you discuss any questions you have with your health care provider. Document Released: 07/07/2015 Document Revised: 02/04/2016 Document Reviewed: 09/07/2014 Elsevier Interactive Patient Education  2018 [employer] Inc.  

## 2017-05-10 NOTE — Progress Notes (Signed)
Subjective:  By signing my name below, I, [name], attest that this documentation has been prepared under the direction and in the presence of [name], MD. Electronically Signed: Moises [name], [place]. 05/10/2017 , 12:50 PM .  Patient was seen in Room 2 .   Patient ID: [name], female    DOB: 10/27/1950, 66 y.o.   MRN: [id number] Chief Complaint  Patient presents with  . Follow-up    Skin nodule  . Medical Clearance    Unable to reach a psychiatrist.    HPI [name] is a 66 y.o. female who presents to Primary Care at [facility] for follow up of skin nodule.    Past Medical History:  Diagnosis Date  . ACUTE DUODEN ULCER W/HEMORR PERF&OBSTRUCTION 06/26/2004   Qualifier: Diagnosis of  By: [name] MD, [name]   . Acute duodenal ulcer with hemorrhage and perforation, with obstruction ([place])   . Allergy   . Anemia   . Anginal pain ([place])    "many many years ago"  . Anxiety    sees [name] NP at Dr. Radonna Ricker office  . Arthritis    "hips" (09/06/2014)  . Asthma   . Barrett's esophagus   . Chronic abdominal pain    narcotic dependence, dr gyarteng-dak at [facility]  . 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 Complication of anesthesia    pt states had too much xanax on board - agitated   . COPD (chronic obstructive pulmonary disease) ([place])   . Depression   . Exertional shortness of breath   . FEVER, RECURRENT 08/11/2009   Qualifier: Diagnosis of  By: [name] MD, [name]   . Fx of fibula 07-28-11   left fibula, 2 places   . Fx two ribs-open 08-24-11   left 4th and 5th  . GASTRIC OUTLET OBSTRUCTION 08/28/2007   In July 2008 she underwent laparoscopic enterolysis, Nissen fundoplication over a #67 bougie, single pledgeted suture colosure of the hiatus and a 3 suture wrap.  Lap truncal vagotomy and a loop gastrojejunostomy was performed.  This was revised in August of 2009 to a roux en Y gastrojejujostomy.     . Gastroparesis 06/03/2007     Qualifier: Diagnosis of  By: [name] MD, [name]   . GERD (gastroesophageal reflux disease)   . Headache    "monthly" (09/06/2014)  . History of [name] transfusion    "related to OR; maybe when I perforated that ulcer"  . History of hiatal hernia   . Lap Nissen + truncal vagotomy July 2008 05/13/2013  . LEG EDEMA 01/19/2008   Qualifier: Diagnosis of  By: [name] MD, [name]   . Menopause   . MENOPAUSE 01/07/2007   Qualifier: Diagnosis of  By: [name], CMA, [place]    . Myocardial infarction ([place])   . Narcotic abuse ([place])   . Peptic ulcer disease    dr Oletta Lamas  . Pneumonia, organism unspecified(486) 11/07/2010   Assoc with R Parapneumonic effusion 09/2010    - Tapped 10/05/10    - CxR resolved 11/07/2010    . 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 Authorizing Provider  albuterol (PROVENTIL HFA;VENTOLIN HFA) 108 (90 Base) MCG/ACT inhaler Inhale 2 puffs into the lungs every 4 (four) hours as needed (cough, shortness of breath or wheezing.). 08/01/15   Shawnee Knapp, MD  BELSOMRA 20 MG TABS TAKE ONE TABLET  AT BEDTIME. 05/02/17   Shawnee Knapp, MD  busPIRone (BUSPAR) 15 MG tablet Take 1 tablet (15 mg total) by mouth 3 (three) times daily. 03/13/17   Shawnee Knapp, MD  Cariprazine HCl (VRAYLAR) 3 MG CAPS Take by mouth.    [provider]  DULoxetine (CYMBALTA) 60 MG capsule TAKE (1) CAPSULE DAILY. 09/12/16   Shawnee Knapp, MD  furosemide (LASIX) 20 MG tablet TAKE 1 TABLET ONCE DAILY AS NEEDED FOR EDEMA. 08/30/16   Shawnee Knapp, MD  methocarbamol (ROBAXIN) 750 MG tablet Take 1 tablet (750 mg total) by mouth 4 (four) times daily. 03/24/17   Shawnee Knapp, MD  nabumetone (RELAFEN) 750 MG tablet Take 1 tablet (750 mg total) by mouth 2 (two) times daily as needed (pain). 03/15/17   Shawnee Knapp, MD  omeprazole (PRILOSEC) 40 MG capsule  01/13/17   [provider]  ondansetron (ZOFRAN-ODT) 8 MG disintegrating tablet Take 1 tablet (8 mg  total) by mouth every 8 (eight) hours as needed for nausea. 02/26/17   Shawnee Knapp, MD  perphenazine (TRILAFON) 4 MG tablet Take 2 tablets (8 mg total) by mouth at bedtime. 03/13/17   Shawnee Knapp, MD  promethazine (PHENERGAN) 6.25 MG/5ML syrup TAKE 20 ML'S BY MOUTH EVERY 8 HOURS AS NEEDED FOR NAUSEA. 02/16/17   Shawnee Knapp, MD  SUBOXONE 8-2 MG FILM Take 1 Film by mouth 2 (two) times daily. 11/10/16   [provider]  traMADol-acetaminophen (ULTRACET) 37.5-325 MG tablet Take 1 tablet by mouth every 12 (twelve) hours as needed. Do not take more than prescribed - can cause seizure or coma. 03/24/17   Shawnee Knapp, MD   Allergies  Allergen Reactions  . Lithium Nausea And Vomiting  . Celebrex [Celecoxib] Other (See Comments)    History of bleeding ulcers  . Morphine Itching  . Quetiapine Other (See Comments)     tardive dyskinesia  . Varenicline Tartrate Other (See Comments)    hallucinations   Past Surgical History:  Procedure Laterality Date  . BIOPSY THYROID  08-13-11   benign nodule, per Dr. Melida Quitter   . CATARACT EXTRACTION W/ INTRAOCULAR LENS  IMPLANT, BILATERAL Bilateral   . COLONOSCOPY    . DILATION AND CURETTAGE OF UTERUS    . ESOPHAGOGASTRODUODENOSCOPY  12-29-09   dr qadeer at [facility], several gastric ulcers  . ESOPHAGOGASTRODUODENOSCOPY (EGD) N/A 09/25/2012   Performed by Beryle Beams, MD at [facility]  . FRACTURE SURGERY    . Gastrectomy and GJ Tube placement N/A 06/29/2013   Performed by Kaylyn Lim, MD at [facility]  . GASTROJEJUNOSTOMY  02-20-08   Roux en Y gastrojejunsotomy w 1 foot Roux limb  . HERNIA REPAIR     "hiatal"  . JOINT REPLACEMENT    . LAPAROSCOPIC NISSEN FUNDOPLICATION    . LAPRASCOPIC ASSISTED OPEN PLACEMENT OF JEJUNOSTOMY TUBE N/A 07/26/2014   Performed by Pedro Earls, MD at [facility] ORS  . ORIF HIP FRACTURE Bilateral    "pins in both of them"  . REPAIR OF PERFORATED ULCER    . TONSILLECTOMY     Family History  Problem Relation Age of Onset  .  Cancer Mother        kidney, magliant tumor on face  . Stroke Mother   . Celiac disease Father   . Cancer Father        kidney and pancreatic   Social History   Socioeconomic History  . Marital status:  Divorced    Spouse name: None  . Number of children: None  . Years of education: None  . Highest education level: None  Social Needs  . Financial resource strain: None  . Food insecurity - worry: None  . Food insecurity - inability: None  . Transportation needs - medical: None  . Transportation needs - non-medical: None  Occupational History  . None  Tobacco Use  . Smoking status: Current Every Day Smoker    Packs/day: 2.00    Years: 50.00    Pack years: 100.00    Types: Cigarettes  . Smokeless tobacco: Never Used  Substance and Sexual Activity  . Alcohol use: No    Alcohol/week: 0.0 oz  . Drug use: No  . Sexual activity: No  Other Topics Concern  . None  Social History Narrative  . None   Depression screen [facility] 2/9 05/10/2017 05/01/2017 04/30/2017 04/26/2017 02/26/2017  Decreased Interest 0 0 0 0 3  Down, Depressed, Hopeless 0 1 1 0 3  PHQ - 2 Score 0 1 1 0 6  Altered sleeping - - - - 3  Tired, decreased energy - - - - 3  Change in appetite - - - - 3  Feeling bad or failure about yourself  - - - - 3  Trouble concentrating - - - - 3  Moving slowly or fidgety/restless - - - - 0  Suicidal thoughts - - - - 0  PHQ-9 Score - - - - 21  Difficult doing work/chores - - - - -  Some recent data might be hidden    Review of Systems See hpi    Objective:   Physical Exam  Constitutional: She is oriented to person, place, and time. She appears well-developed and well-nourished. No distress.  HENT:  Head: Normocephalic and atraumatic.  Eyes: EOM are normal. Pupils are equal, round, and reactive to light.  Neck: Neck supple.  Cardiovascular: Normal rate.  Pulmonary/Chest: Effort normal. No respiratory distress.  Musculoskeletal: Normal range of motion.  Neurological: She  is alert and oriented to person, place, and time.  Skin: Skin is warm and dry.  Psychiatric: She has a normal mood and affect. Her behavior is normal.  Nursing note and vitals reviewed.   BP 110/71   Pulse 94   Temp 98.4 F (36.9 C) (Oral)   Resp 16   Ht 5' 3.5" (1.613 m)   Wt 103 lb 12.8 oz (47.1 kg)   SpO2 96%   BMI 18.10 kg/m    Risks and benefits reviewed, and verbal informed consent obtained. Cleansed with alcohol 2. Anesthesia with subcutaneous administration of 1% lidocaine with epinephrine. 8mm punch biopsy obtained around site of prior shave biopsy. Closed with a 4-0 prolene single suture which acheived hemostasis. Biopsy site dressed with mupirocin in a pressure bandage. EBL less than 1 mL. No complications. Patient tolerated procedure well. Wound care reviewed in detail and all questions answered. F/u in 1 wk for suture removal and review pathology results    Assessment & Plan:   1. Basal cell carcinoma (BCC) of skin of other part of torso   Excision by punch biopsy today.    Pt needs referral to pain mngmnt  Meds ordered this encounter  Medications  . HYDROcodone-acetaminophen (NORCO) 10-325 MG tablet    Sig: Take 1 tablet every 8 (eight) hours as needed by mouth.    Dispense:  6 tablet    Refill:  0   ADDENDUM: Pharmacist called and  stated pt has had several short-acting narcotics filled at several different pharmacies this month so will not fill the above rx   [name], M.D.  Primary Care at [facility] [address] [place], [place] [address] [phone] phone [phone] fax  05/13/17 9:46 AM

## 2017-05-14 ENCOUNTER — Telehealth: Payer: Self-pay

## 2017-05-14 NOTE — Telephone Encounter (Signed)
Copied from [place] #[id number]. Topic: General - Other >> May 14, 2017  9:28 AM Conception Chancy, NT wrote: Reason for CRM: pt would like to know the name of the therapist the doctor told her to call. She can not remember the name of it and would the nurse to call her and inform.

## 2017-05-17 ENCOUNTER — Other Ambulatory Visit: Payer: Self-pay

## 2017-05-17 ENCOUNTER — Ambulatory Visit (INDEPENDENT_AMBULATORY_CARE_PROVIDER_SITE_OTHER): Payer: Medicare Other | Admitting: Family Medicine

## 2017-05-17 ENCOUNTER — Encounter: Payer: Self-pay | Admitting: Family Medicine

## 2017-05-17 VITALS — BP 138/80 | HR 70 | Temp 99.0°F | Resp 16 | Ht 63.78 in | Wt 109.0 lb

## 2017-05-17 DIAGNOSIS — F1120 Opioid dependence, uncomplicated: Secondary | ICD-10-CM

## 2017-05-17 DIAGNOSIS — G894 Chronic pain syndrome: Secondary | ICD-10-CM | POA: Diagnosis not present

## 2017-05-17 DIAGNOSIS — Z720 Tobacco use: Secondary | ICD-10-CM

## 2017-05-17 DIAGNOSIS — M546 Pain in thoracic spine: Secondary | ICD-10-CM

## 2017-05-17 DIAGNOSIS — G4700 Insomnia, unspecified: Secondary | ICD-10-CM

## 2017-05-17 DIAGNOSIS — G8929 Other chronic pain: Secondary | ICD-10-CM

## 2017-05-17 DIAGNOSIS — R109 Unspecified abdominal pain: Secondary | ICD-10-CM | POA: Diagnosis not present

## 2017-05-17 DIAGNOSIS — J449 Chronic obstructive pulmonary disease, unspecified: Secondary | ICD-10-CM

## 2017-05-17 DIAGNOSIS — R110 Nausea: Secondary | ICD-10-CM

## 2017-05-17 DIAGNOSIS — H547 Unspecified visual loss: Secondary | ICD-10-CM

## 2017-05-17 DIAGNOSIS — Z9181 History of falling: Secondary | ICD-10-CM

## 2017-05-17 DIAGNOSIS — F411 Generalized anxiety disorder: Secondary | ICD-10-CM

## 2017-05-17 DIAGNOSIS — Z742 Need for assistance at home and no other household member able to render care: Secondary | ICD-10-CM

## 2017-05-17 DIAGNOSIS — H3553 Other dystrophies primarily involving the sensory retina: Secondary | ICD-10-CM | POA: Diagnosis not present

## 2017-05-17 DIAGNOSIS — H3530 Unspecified macular degeneration: Secondary | ICD-10-CM

## 2017-05-17 DIAGNOSIS — R413 Other amnesia: Secondary | ICD-10-CM

## 2017-05-17 DIAGNOSIS — C44519 Basal cell carcinoma of skin of other part of trunk: Secondary | ICD-10-CM | POA: Diagnosis not present

## 2017-05-17 DIAGNOSIS — F331 Major depressive disorder, recurrent, moderate: Secondary | ICD-10-CM

## 2017-05-17 DIAGNOSIS — H548 Legal blindness, as defined in USA: Secondary | ICD-10-CM

## 2017-05-17 DIAGNOSIS — M818 Other osteoporosis without current pathological fracture: Secondary | ICD-10-CM

## 2017-05-17 MED ORDER — ONDANSETRON 8 MG PO TBDP: 8.0000 mg | ORAL_TABLET | Freq: Three times a day (TID) | ORAL | 2 refills | Status: DC | PRN

## 2017-05-17 MED ORDER — BUSPIRONE HCL 30 MG PO TABS: 30.0000 mg | ORAL_TABLET | Freq: Two times a day (BID) | ORAL | 1 refills | Status: DC

## 2017-05-17 MED ORDER — SUVOREXANT 20 MG PO TABS: 1.0000 | ORAL_TABLET | Freq: Every day | ORAL | 0 refills | Status: DC

## 2017-05-17 MED ORDER — PROMETHAZINE-CODEINE 6.25-10 MG/5ML PO SYRP: 10.0000 mL | ORAL_SOLUTION | Freq: Four times a day (QID) | ORAL | 0 refills | Status: DC | PRN

## 2017-05-17 NOTE — Patient Instructions (Signed)
[facility] [place], [address] [place], [place] [address] Ph: [phone]

## 2017-05-17 NOTE — Progress Notes (Addendum)
Subjective:  By signing my name below, I, [name], attest that this documentation has been prepared under the direction and in the presence of [name], MD. Electronically Signed: [name], Randall. 05/17/2017 , 10:03 AM .  Patient was seen in Room 2 .   Patient ID: [name], female    DOB: [birth year], 66 y.o.   MRN: [id number] Chief Complaint  Patient presents with  . Medication Refill    pt would like to talk about some changes to her medication a refills    HPI [name] is a 66 y.o. female who presents to Primary Care at [facility] for medication refill. She had repeat excision of basal cell carcinoma with unclear margins; 1 week prior, pathology returned benign.   Filled 20 ultraset, from me 10/2, suboxone 10/26 from dr brown. And bellsomra from me 11/13.   Patient is currently taking Buspar 10mg  TID and her mood has been bad; "feeling really high strung and anxious". Her roommate is taking Buspar 30mg  BID, "doing fine with her mood".  She also notes being out of Zofran.   Patient had filled:  #20 Ultracet from me on Oct 2 Suboxone from Dr. Owens Shark on Oct 26 Belsomra from me on Nov 13 Last cough syrup was June 26  Past Medical History:  Diagnosis Date  . ACUTE DUODEN ULCER W/HEMORR PERF&OBSTRUCTION 06/26/2004   Qualifier: Diagnosis of  By: [name] MD, [name]   . Acute duodenal ulcer with hemorrhage and perforation, with obstruction ([place])   . Allergy   . Anemia   . Anginal pain ([place])    "many many years ago"  . Anxiety    sees [name] NP at Dr. Radonna Ricker office  . Arthritis    "hips" (09/06/2014)  . Asthma   . Barrett's esophagus   . Chronic abdominal pain    narcotic dependence, dr gyarteng-dak at [facility]  . 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 Complication of anesthesia    pt states had too much xanax on board - agitated   . COPD (chronic obstructive pulmonary disease) ([place])   . Depression   .  Exertional shortness of breath   . FEVER, RECURRENT 08/11/2009   Qualifier: Diagnosis of  By: [name] MD, [name]   . Fx of fibula 07-28-11   left fibula, 2 places   . Fx two ribs-open 08-24-11   left 4th and 5th  . GASTRIC OUTLET OBSTRUCTION 08/28/2007   In July 2008 she underwent laparoscopic enterolysis, Nissen fundoplication over a #24 bougie, single pledgeted suture colosure of the hiatus and a 3 suture wrap.  Lap truncal vagotomy and a loop gastrojejunostomy was performed.  This was revised in August of 2009 to a roux en Y gastrojejujostomy.     . Gastroparesis 06/03/2007   Qualifier: Diagnosis of  By: [name] MD, [name]   . GERD (gastroesophageal reflux disease)   . Headache    "monthly" (09/06/2014)  . History of blood transfusion    "related to OR; maybe when I perforated that ulcer"  . History of hiatal hernia   . Lap Nissen + truncal vagotomy July 2008 05/13/2013  . LEG EDEMA 01/19/2008   Qualifier: Diagnosis of  By: [name] MD, [name]   . Menopause   . MENOPAUSE 01/07/2007   Qualifier: Diagnosis of  By: [name], CMA, [place]    . Myocardial infarction ([place])   . Narcotic abuse ([place])   . Peptic ulcer  disease    dr Oletta Lamas  . Pneumonia, organism unspecified(486) 11/07/2010   Assoc with R Parapneumonic effusion 09/2010    - Tapped 10/05/10    - CxR resolved 11/07/2010    . 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 Authorizing Provider  albuterol (PROVENTIL HFA;VENTOLIN HFA) 108 (90 Base) MCG/ACT inhaler Inhale 2 puffs into the lungs every 4 (four) hours as needed (cough, shortness of breath or wheezing.). 08/01/15   Shawnee Knapp, MD  BELSOMRA 20 MG TABS TAKE ONE TABLET AT BEDTIME. 05/02/17   Shawnee Knapp, MD  busPIRone (BUSPAR) 15 MG tablet Take 1 tablet (15 mg total) by mouth 3 (three) times daily. 03/13/17   Shawnee Knapp, MD  Cariprazine HCl (VRAYLAR) 3 MG CAPS Take by mouth.    [provider]  DULoxetine (CYMBALTA) 60  MG capsule TAKE (1) CAPSULE DAILY. 09/12/16   Shawnee Knapp, MD  furosemide (LASIX) 20 MG tablet TAKE 1 TABLET ONCE DAILY AS NEEDED FOR EDEMA. 08/30/16   Shawnee Knapp, MD  HYDROcodone-acetaminophen [facility]) 10-325 MG tablet Take 1 tablet every 8 (eight) hours as needed by mouth. 05/10/17   Shawnee Knapp, MD  methocarbamol (ROBAXIN) 750 MG tablet Take 1 tablet (750 mg total) by mouth 4 (four) times daily. 03/24/17   Shawnee Knapp, MD  nabumetone (RELAFEN) 750 MG tablet Take 1 tablet (750 mg total) by mouth 2 (two) times daily as needed (pain). 03/15/17   Shawnee Knapp, MD  omeprazole (PRILOSEC) 40 MG capsule  01/13/17   [provider]  ondansetron (ZOFRAN-ODT) 8 MG disintegrating tablet Take 1 tablet (8 mg total) by mouth every 8 (eight) hours as needed for nausea. 02/26/17   Shawnee Knapp, MD  perphenazine (TRILAFON) 4 MG tablet Take 2 tablets (8 mg total) by mouth at bedtime. 03/13/17   Shawnee Knapp, MD  promethazine (PHENERGAN) 6.25 MG/5ML syrup TAKE 20 ML'S BY MOUTH EVERY 8 HOURS AS NEEDED FOR NAUSEA. 02/16/17   Shawnee Knapp, MD  SUBOXONE 8-2 MG FILM Take 1 Film by mouth 2 (two) times daily. 11/10/16   [provider]  traMADol-acetaminophen (ULTRACET) 37.5-325 MG tablet Take 1 tablet by mouth every 12 (twelve) hours as needed. Do not take more than prescribed - can cause seizure or coma. 03/24/17   Shawnee Knapp, MD   Allergies  Allergen Reactions  . Lithium Nausea And Vomiting  . Celebrex [Celecoxib] Other (See Comments)    History of bleeding ulcers  . Morphine Itching  . Quetiapine Other (See Comments)     tardive dyskinesia  . Varenicline Tartrate Other (See Comments)    hallucinations    Past Surgical History:  Procedure Laterality Date  . BIOPSY THYROID  08-13-11   benign nodule, per Dr. Melida Quitter   . CATARACT EXTRACTION W/ INTRAOCULAR LENS  IMPLANT, BILATERAL Bilateral   . COLONOSCOPY    . DILATION AND CURETTAGE OF UTERUS    . ESOPHAGOGASTRODUODENOSCOPY  12-29-09   dr qadeer at  [facility], several gastric ulcers  . ESOPHAGOGASTRODUODENOSCOPY N/A 09/25/2012   Procedure: ESOPHAGOGASTRODUODENOSCOPY (EGD);  Surgeon: Beryle Beams, MD;  Location: [facility];  Service: Endoscopy;  Laterality: N/A;  . FRACTURE SURGERY    . GASTROJEJUNOSTOMY  02-20-08   Roux en Y gastrojejunsotomy w 1 foot Roux limb  . GASTROSTOMY N/A 07/26/2014   Procedure: LAPRASCOPIC ASSISTED OPEN PLACEMENT OF JEJUNOSTOMY TUBE;  Surgeon:  Pedro Earls, MD;  Location: [facility];  Service: General;  Laterality: N/A;  . HERNIA REPAIR     "hiatal"  . JOINT REPLACEMENT    . LAPAROSCOPIC NISSEN FUNDOPLICATION    . LAPAROSCOPIC PARTIAL GASTRECTOMY N/A 06/29/2013   Procedure: Gastrectomy and GJ Tube placement;  Surgeon: Pedro Earls, MD;  Location: [facility];  Service: General;  Laterality: N/A;  . ORIF HIP FRACTURE Bilateral    "pins in both of them"  . REPAIR OF PERFORATED ULCER    . TONSILLECTOMY     Family History  Problem Relation Age of Onset  . Cancer Mother        kidney, magliant tumor on face  . Stroke Mother   . Celiac disease Father   . Cancer Father        kidney and pancreatic   Social History   Socioeconomic History  . Marital status: Divorced    Spouse name: None  . Number of children: None  . Years of education: None  . Highest education level: None  Social Needs  . Financial resource strain: None  . Food insecurity - worry: None  . Food insecurity - inability: None  . Transportation needs - medical: None  . Transportation needs - non-medical: None  Occupational History  . None  Tobacco Use  . Smoking status: Current Every Day Smoker    Packs/day: 2.00    Years: 50.00    Pack years: 100.00    Types: Cigarettes  . Smokeless tobacco: Never Used  Substance and Sexual Activity  . Alcohol use: No    Alcohol/week: 0.0 oz  . Drug use: No  . Sexual activity: No  Other Topics Concern  . None  Social History Narrative  . None   Depression screen [facility] 2/9 05/17/2017 05/10/2017  05/01/2017 04/30/2017 04/26/2017  Decreased Interest 0 0 0 0 0  Down, Depressed, Hopeless 0 0 1 1 0  PHQ - 2 Score 0 0 1 1 0  Altered sleeping - - - - -  Tired, decreased energy - - - - -  Change in appetite - - - - -  Feeling bad or failure about yourself  - - - - -  Trouble concentrating - - - - -  Moving slowly or fidgety/restless - - - - -  Suicidal thoughts - - - - -  PHQ-9 Score - - - - -  Difficult doing work/chores - - - - -  Some recent data might be hidden    Review of Systems  Constitutional: Negative for chills, fatigue, fever and unexpected weight change.  Respiratory: Negative for cough.   Gastrointestinal: Negative for constipation, diarrhea, nausea and vomiting.  Skin: Negative for rash and wound.  Neurological: Negative for dizziness, weakness and headaches.       Objective:   Physical Exam  Constitutional: She is oriented to person, place, and time. She appears well-developed and well-nourished. No distress.  HENT:  Head: Normocephalic and atraumatic.  Eyes: EOM are normal. Pupils are equal, round, and reactive to light.  Neck: Neck supple.  Cardiovascular: Normal rate.  Pulmonary/Chest: Effort normal. No respiratory distress.  Musculoskeletal: Normal range of motion.  Neurological: She is alert and oriented to person, place, and time.  Skin: Skin is warm and dry.  Psychiatric: She has a normal mood and affect. Her behavior is normal.  Nursing note and vitals reviewed.   BP 138/80   Pulse 70   Temp 99 F (37.2 C) (  Oral)   Resp 16   Ht 5' 3.78" (1.62 m)   Wt 109 lb (49.4 kg)   SpO2 97%   BMI 18.84 kg/m      Assessment & Plan:   1. Basal cell carcinoma (BCC) of skin of other part of torso   2. Chronic midline thoracic back pain   3. Chronic pain syndrome   4. Opioid type dependence, continuous ([place]) - pt currently on suboxone 2 films qd from her psychiatrist. However, really hoping to get her into pain mngmnt - referred to [facility]  [address] in [place] on 10/2, info sent on 10/10 - pt states she hasn't heard from them so gave her their phone # for the 3rd time so she can call to sched her appt. Will ask referrals to f/u w/ [facility] to ensure they got & accepted referral.  5. Chronic abdominal pain   6. Tobacco abuse - referred for screening lung CT several mos ago but pt never returned program's calls to sched they cancelled it - will need new order if she decides to proceed. Still smoking a lot as mood sxs severe, only way to cope with the pain, nothing else to do.  7. Chronic obstructive pulmonary disease, unspecified COPD type ([place]) -requests refill of cough syrup - last was 5 mos ago so ok to refill.  8. Insomnia, unspecified type - requests refill on belsomra - last rx filled from me on 11/13 #30 so gave pt rx that she can fill on/after 12/12. Will not need further refill until 1/10 but was being rx'd by pysch orginally so watch database.  9. Anxiety state - last visit we incrased buspar to 15 tid but pt reports she is still only taking 10 tid from psych - ok to increase buspirone to 30 bid per pt requests - wants to be on the strongest dose poss. Requests xanax sev times but she feel that she needs her narcotic pain med more than bzd.   10. Major depressive disorder, recurrent episode, moderate ([place])   11. Memory loss, short term   12. Early onset macular degeneration - I think she did finally follow-up with Dr. Katy Fitch recently - note not yet in chart  13. Legally blind   14. Need for assistance at home and no other household member able to render care - needs home aid to help with cooking, cleaning, transportation, medication administration/compliance/pill box due to short-term memory loss and severe and worsening bilateral vision loss due to macular deg, high fall risk, and chronic back and abd pain as well as COPD limiting activity - completed form for [employer] for "request for independent  assessment for personal care services  15.    Blindness with onset after age 14 16.    Other osteoporosis, without current pathological fracture - h/o B hip fracture and vertebral compression frxs (last saw ortho - Dr. Mardelle Matte at [facility] ~11/2014) - last DEXA 2012 - need to repeat DEXA and discuss trx at next visit. 17.    At high risk for injury related to fall 19.    Chronic nausea - refilled zofran - uses about 1x/d on average.    Meds ordered this encounter  Medications  . busPIRone (BUSPAR) 30 MG tablet    Sig: Take 1 tablet (30 mg total) by mouth 2 (two) times daily.    Dispense:  60 tablet    Refill:  1  . ondansetron (ZOFRAN-ODT) 8 MG disintegrating tablet  Sig: Take 1 tablet (8 mg total) by mouth every 8 (eight) hours as needed for nausea.    Dispense:  20 tablet    Refill:  2  . Suvorexant (BELSOMRA) 20 MG TABS    Sig: Take 1 tablet by mouth at bedtime.    Dispense:  30 tablet    Refill:  0  . promethazine-codeine (PHENERGAN WITH CODEINE) 6.25-10 MG/5ML syrup    Sig: Take 10 mLs by mouth every 6 (six) hours as needed for cough.    Dispense:  180 mL    Refill:  0    I personally performed the services described in this documentation, which was scribed in my presence. The recorded information has been reviewed and considered, and addended by me as needed.   [name], M.D.  Primary Care at [facility] [address] [place], [place] [address] [phone] phone [phone] fax  05/18/17 5:51 AM

## 2017-05-18 ENCOUNTER — Encounter: Payer: Self-pay | Admitting: Family Medicine

## 2017-05-18 DIAGNOSIS — R110 Nausea: Secondary | ICD-10-CM | POA: Insufficient documentation

## 2017-05-18 DIAGNOSIS — Z9181 History of falling: Secondary | ICD-10-CM | POA: Insufficient documentation

## 2017-05-18 NOTE — Telephone Encounter (Signed)
Reviewed in visit today and placed info again into AVS as well as wrote it on some of her scrap paper. [facility] [address] in [place].

## 2017-05-20 ENCOUNTER — Ambulatory Visit: Payer: Self-pay

## 2017-05-20 ENCOUNTER — Telehealth: Payer: Self-pay | Admitting: Family Medicine

## 2017-05-20 NOTE — Telephone Encounter (Signed)
Confirmed pt's appt with pt for [facility] for dec 5 at 1:45pm pt is fine with that appt.Marland Kitchen

## 2017-05-21 DIAGNOSIS — J449 Chronic obstructive pulmonary disease, unspecified: Secondary | ICD-10-CM | POA: Diagnosis not present

## 2017-05-22 ENCOUNTER — Other Ambulatory Visit: Payer: Self-pay

## 2017-05-22 ENCOUNTER — Encounter: Payer: Self-pay | Admitting: *Deleted

## 2017-05-22 ENCOUNTER — Other Ambulatory Visit: Payer: Self-pay | Admitting: *Deleted

## 2017-05-22 ENCOUNTER — Other Ambulatory Visit: Payer: Self-pay | Admitting: Family Medicine
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
## 2017-05-22 NOTE — Patient Outreach (Signed)
[place] [facility]) Care Management  05/22/2017  SHAYLEEN EPPINGER Jan 24, 1951 [id number]   CSW made an initial attempt to try and contact patient today to perform phone assessment, as well as assess and assist with social needs and services, without success.  A HIPAA compliant message was left for patient on voicemail.  CSW is currently awaiting a return call. CSW will make a second outreach attempt within the next week, if CSW does not receive a return call from patient in the meantime. [name], BSW, MSW, LCSW  Licensed Education officer, environmental Health System  Mailing [place] [address] [place], [place] [address] Physical Address-[address], [place], [place] [address] Toll Free Main # [phone] Fax # [phone] Cell # [phone]  Office # [phone] [email or web address]

## 2017-05-26 ENCOUNTER — Other Ambulatory Visit: Payer: Self-pay | Admitting: *Deleted

## 2017-05-26 ENCOUNTER — Encounter: Payer: Self-pay | Admitting: *Deleted

## 2017-05-26 NOTE — Patient Outreach (Signed)
[place] [facility]) Care Management  05/26/2017  [name] [birth year] [id number]   CSW was able to make initial contact with patient today to perform phone assessment, as well as assess and assist with social work needs and services.  CSW introduced self, explained role and types of services provided through [place] [facility] ([place] [facility]).  Patient reported she remembers having spoken with CSW several weeks ago.  CSW further explained to patient that CSW works with patient's RNCM, also with [place] [facility], [name]. CSW then explained the reason for the call, indicating that [name] thought that patient would benefit from social work services and resources to assist with symptoms of depression, as well as address safety concerns in the home.  CSW obtained two HIPAA compliant identifiers from patient, which included patient's name and date of birth. CSW is already aware that patient and her roommate, [name] are having financial concerns, having to eat out for every meal because they do not have a working refrigerator and stove.  Patient claims that she and [name] are unable to afford to purchase these items, or rent them from a local appliance company.  Patient is currently working with her attorney to try and have various stipulations removed from her trust fund, but does not know how long this process will take.  In the meantime, patient reported that she and [name] are "struggling to eat, struggling to pay bills, struggling to manage".  CSW offered counseling and supportive services, where appropriate.  CSW also agreed to meet with patient for an initial home visit to address all of her concerns.  The initial home visit has been scheduled for Tuesday, December 18. 2018 at 10:00AM.  Patient has been encouraged to contact CSW if anything changes, or if assistance is needed in the meantime.  Otherwise, CSW will make arrangements to meet  with patient on the 18th. [facility] CM Care Plan Problem One     Most Recent Value  Care Plan Problem One  Depressive symptoms.  Role Documenting the Problem One  Clinical Social Worker  Care Plan for Problem One  Active  [facility] Long Term Goal   Patient will learn to utilize techniques for managing symptoms of depression within the next 45 days.  [facility] Long Term Goal Start Date  05/26/17  Interventions for Problem One Long Term Goal  CSW will offer counseling and supportive services, teach patient various techniques for managing symptoms and provide patient with literature pertaining specifically to signs and symptoms of depression.  [facility] CM Short Term Goal #1   Patient will review literature pertaining to signs and symptoms of depression to better understand her mental health diagnosis, within the next three weeks.  [facility] CM Short Term Goal #1 Start Date  05/26/17  Interventions for Short Term Goal #1  CSW will provide patient with literature pertaining specifically to managing symptoms of depression.  [facility] CM Short Term Goal #2   Patient will utilize deep breathing exercises and relaxation techniques when experiencing symptoms of depression, within the next three weeks.  [facility] CM Short Term Goal #2 Start Date  05/26/17  Interventions for Short Term Goal #2  CSW will teach patient deep breathing exercises and relaxation techniques.     [name], BSW, MSW, LCSW  Licensed Education officer, environmental Health System  Mailing [place] [address] [place], [place] [address] Physical Address-[address], [place],  [address] Toll  Free Main # [phone] Fax # [phone] Cell # [phone]  Office # [phone] [name]

## 2017-05-26 NOTE — Patient Outreach (Signed)
Request received from Joanna Saporito, LCSW to mail patient personal care resources.  Information mailed today. 

## 2017-05-27 ENCOUNTER — Other Ambulatory Visit: Payer: Self-pay

## 2017-05-27 ENCOUNTER — Telehealth: Payer: Self-pay | Admitting: Pharmacist

## 2017-05-27 NOTE — Patient Outreach (Signed)
[place] [facility]) Care Management  05/27/2017  [name] 03-19-1951 [id number]  Patient was called per referral from [name], [name] for medication management. HIPAA identifiers were obtained. Patient is a 66 year old female with multiple medical conditions including but not limited to:  COPD, chronic pain, depression, osteoporosis, vitamin D deficiency, and blindness.    Patient said she manages her medications on her own with some help from her roommate.  Due to the patient's blindness, she was unable to review her medications because she could not read the medication bottles.   A home visit was scheduled for this Friday May 30, 2017 at 10:30am.   Plan: Conduct home visit.  [name], PharmD, [place] Clinical Pharmacist [phone]

## 2017-05-27 NOTE — Addendum Note (Signed)
Addended byCalla Kicks T on: 05/27/2017 10:13 AM   Modules accepted: Level of Service, SmartSet

## 2017-05-27 NOTE — Telephone Encounter (Signed)
This encounter was created in error - please disregard.

## 2017-05-28 ENCOUNTER — Ambulatory Visit: Payer: Self-pay | Admitting: *Deleted

## 2017-05-28 ENCOUNTER — Other Ambulatory Visit: Payer: Self-pay | Admitting: *Deleted

## 2017-05-28 ENCOUNTER — Encounter: Payer: Self-pay | Admitting: *Deleted

## 2017-05-28 DIAGNOSIS — G8929 Other chronic pain: Secondary | ICD-10-CM | POA: Diagnosis not present

## 2017-05-28 DIAGNOSIS — K5909 Other constipation: Secondary | ICD-10-CM | POA: Diagnosis not present

## 2017-05-28 DIAGNOSIS — R1084 Generalized abdominal pain: Secondary | ICD-10-CM | POA: Diagnosis not present

## 2017-05-28 DIAGNOSIS — R109 Unspecified abdominal pain: Secondary | ICD-10-CM | POA: Diagnosis not present

## 2017-05-28 DIAGNOSIS — Z79899 Other long term (current) drug therapy: Secondary | ICD-10-CM | POA: Diagnosis not present

## 2017-05-28 NOTE — Patient Outreach (Signed)
[place]) Care Management  05/28/2017  [name] 1950-11-26 [id number]   Pt referral for community case management 11/29  Telephone Assessment   RN spoke with pt today and introduced the [facility] program and services. RN verified pt identifiers and further inquired on pt's needs. Verified pt's involved with [facility] social worker and pharmacy. RN inquired on pt's medical needs and inquired on her COPD. Pt states she has home O2 and nebulizers she uses but has not had to use her nebulizers lately. Pt states she has been clear with her breathing with no issues. RN discussed the COPD action plan and educated pt on the GREEN/YELLOW/RED zones. After education verified pt is in the GREEN zone. Further discussed environmental factors that can precipitate symptoms and stress the importance of reducing such stressors as a prevention measure. Offered community home visits to further involve ongoing education with managing these factors however pt declined home visits at this time but receptive to RN sending her [employer] education on COPD, A.D packet and a [facility] calendar in order for her to review the COPD action zone. No needs at this time for nursing as RN will update pt's primary and [facility] staff already involved. Pt aware to request community if she feels other issues arise that she may need further assistance from this RN case manager  Raina Mina, RN Care Management Coordinator [facility] [phone]

## 2017-05-30 ENCOUNTER — Other Ambulatory Visit: Payer: Self-pay | Admitting: Pharmacist

## 2017-05-30 NOTE — Patient Outreach (Signed)
[facility]) Care Management  05/30/2017  [name] 03-Feb-1951 [id number]   Went to the patient's home for scheduled home visit. Unfortunately, she did not answer the door. I called her multiple times and sat in her driveway for >59 minutes without a call back or anyone coming to the door.  Appointment will be ruled a "no show".  Follow up with patient in 5-7 business days to follow up.   Elayne Guerin, PharmD, [facility] Clinical Pharmacist [phone]

## 2017-05-31 ENCOUNTER — Other Ambulatory Visit: Payer: Self-pay

## 2017-05-31 ENCOUNTER — Encounter: Payer: Self-pay | Admitting: Family Medicine

## 2017-05-31 ENCOUNTER — Ambulatory Visit (INDEPENDENT_AMBULATORY_CARE_PROVIDER_SITE_OTHER): Payer: Medicare Other | Admitting: Family Medicine

## 2017-05-31 VITALS — BP 118/68 | HR 67 | Temp 97.5°F | Resp 16 | Ht 62.99 in | Wt 108.0 lb

## 2017-05-31 DIAGNOSIS — F1920 Other psychoactive substance dependence, uncomplicated: Secondary | ICD-10-CM | POA: Diagnosis not present

## 2017-05-31 DIAGNOSIS — Z1211 Encounter for screening for malignant neoplasm of colon: Secondary | ICD-10-CM | POA: Diagnosis not present

## 2017-05-31 DIAGNOSIS — R1013 Epigastric pain: Secondary | ICD-10-CM

## 2017-05-31 DIAGNOSIS — K911 Postgastric surgery syndromes: Secondary | ICD-10-CM

## 2017-05-31 DIAGNOSIS — K3184 Gastroparesis: Secondary | ICD-10-CM | POA: Diagnosis not present

## 2017-05-31 DIAGNOSIS — G8929 Other chronic pain: Secondary | ICD-10-CM

## 2017-05-31 DIAGNOSIS — G4701 Insomnia due to medical condition: Secondary | ICD-10-CM | POA: Diagnosis not present

## 2017-05-31 DIAGNOSIS — K5909 Other constipation: Secondary | ICD-10-CM | POA: Diagnosis not present

## 2017-05-31 DIAGNOSIS — Z79899 Other long term (current) drug therapy: Secondary | ICD-10-CM | POA: Diagnosis not present

## 2017-05-31 MED ORDER — SUCRALFATE 1 G PO TABS: 1.0000 g | ORAL_TABLET | Freq: Three times a day (TID) | ORAL | 0 refills | Status: DC

## 2017-05-31 MED ORDER — TRAZODONE HCL 150 MG PO TABS: 150.0000 mg | ORAL_TABLET | Freq: Every day | ORAL | 0 refills | Status: DC

## 2017-05-31 NOTE — Progress Notes (Signed)
Subjective:  By signing my name below, I, [name], attest that this documentation has been prepared under the direction and in the presence of [name] Cheadle, MD Electronically Signed: Ladene [name], ED Scribe 05/31/2017 at 11:35 AM.   Patient ID: [name], female    DOB: 05/08/1951, 66 y.o.   MRN: [id number]  Chief Complaint  Patient presents with  . Abdominal Pain    follow-up    HPI [name] is a 66 y.o. female who presents to Primary Care at [facility] for f/u regarding abdominal pain. Pt had an appointment with Dr. Dallas Breeding at [facility] on 12/5 at 1:45 PM. She was prescribed #28 hydrocodone 5 qid. By Dr. Dallas Breeding. Pt states the appointment went well and her next appointment is scheduled for next week.  Pt is still experiencing some upper abdominal pain that is exacerbated after eating. She has been taking Belsomra and OTC Z-Quil for difficulty falling and staying asleep which caused constipation but was also given meds for that. Pt states that she just does not seem to be sleepy. She does report some bowel incontinence since starting the medication. GI: Dr. Benson Norway at [facility]. GI Surgeon: Dr. Hassell Done. She last saw the Chief of GI at [facility] in 2014 for recurrent ulcers who recommended the best approach was to take out most of her stomach. She agrees to schedule another colonoscopy.   Pt is seeking another psychiatrist; states her current one is only able to see her once every 6 months. She is currently taking Buspar and Cymbalta; unsure if she takes Trilafon. Does report that she occasionally takes some of her friend's Xanax as well.  Past Medical History:  Diagnosis Date  . ACUTE DUODEN ULCER W/HEMORR PERF&OBSTRUCTION 06/26/2004   Qualifier: Diagnosis of  By: [name] MD, [name]   . Acute duodenal ulcer with hemorrhage and perforation, with obstruction ([place])   . Allergy   . Anemia   . Anginal pain ([place])    "many many years ago"  . Anxiety    sees [name] NP at Dr.  Radonna [name] office  . Arthritis    "hips" (09/06/2014)  . Asthma   . Barrett's esophagus   . Chronic abdominal pain    narcotic dependence, dr gyarteng-dak at [facility]  . 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 Complication of anesthesia    pt states had too much xanax on board - agitated   . COPD (chronic obstructive pulmonary disease) ([place])   . Depression   . Exertional shortness of breath   . Fall 11/23/2014   frequent falls  . FEVER, RECURRENT 08/11/2009   Qualifier: Diagnosis of  By: [name] MD, [name]   . Fx of fibula 07-28-11   left fibula, 2 places   . Fx two ribs-open 08-24-11   left 4th and 5th  . GASTRIC OUTLET OBSTRUCTION 08/28/2007   In July 2008 she underwent laparoscopic enterolysis, Nissen fundoplication over a #16 bougie, single pledgeted suture colosure of the hiatus and a 3 suture wrap.  Lap truncal vagotomy and a loop gastrojejunostomy was performed.  This was revised in August of 2009 to a roux en Y gastrojejujostomy.     . Gastroparesis 06/03/2007   Qualifier: Diagnosis of  By: [name] MD, [name]   . GERD (gastroesophageal reflux disease)   . Headache    "monthly" (09/06/2014)  . History of blood transfusion    "related to OR; maybe when I perforated that  ulcer"  . History of hiatal hernia   . Lap Nissen + truncal vagotomy July 2008 05/13/2013  . LEG EDEMA 01/19/2008   Qualifier: Diagnosis of  By: [name] MD, [name]   . Macular degeneration, bilateral    early onset, severe, legally blind and worsening. Followed by dr. Katy Fitch  . Menopause   . MENOPAUSE 01/07/2007   Qualifier: Diagnosis of  By: [name], CMA, [place]    . Myocardial infarction ([place])   . Narcotic abuse ([place])   . Osteoporosis    severe  . Oxygen deficiency   . Peptic ulcer disease    dr Oletta Lamas  . Pneumonia, organism unspecified(486) 11/07/2010   Assoc with R Parapneumonic effusion 09/2010    - Tapped 10/05/10    - CxR resolved 11/07/2010    . S/P jejunostomy Feb 2016  07/26/2014   Current Outpatient Medications on File Prior to Visit  Medication Sig Dispense Refill  . albuterol (PROVENTIL HFA;VENTOLIN HFA) 108 (90 Base) MCG/ACT inhaler Inhale 2 puffs into the lungs every 4 (four) hours as needed (cough, shortness of breath or wheezing.). 1 Inhaler 3  . busPIRone (BUSPAR) 30 MG tablet Take 1 tablet (30 mg total) by mouth 2 (two) times daily. 60 tablet 1  . Cariprazine HCl (VRAYLAR) 3 MG CAPS Take by mouth.    . DULoxetine (CYMBALTA) 60 MG capsule TAKE (1) CAPSULE DAILY. 30 capsule 0  . furosemide (LASIX) 20 MG tablet TAKE 1 TABLET ONCE DAILY AS NEEDED FOR EDEMA. 30 tablet 0  . HYDROcodone-acetaminophen (NORCO) 10-325 MG tablet Take 1 tablet every 8 (eight) hours as needed by mouth. 6 tablet 0  . methocarbamol (ROBAXIN) 750 MG tablet Take 1 tablet (750 mg total) by mouth 4 (four) times daily. 40 tablet 0  . nabumetone (RELAFEN) 750 MG tablet Take 1 tablet (750 mg total) by mouth 2 (two) times daily as needed (pain). 30 tablet 0  . omeprazole (PRILOSEC) 40 MG capsule   0  . ondansetron (ZOFRAN-ODT) 8 MG disintegrating tablet Take 1 tablet (8 mg total) by mouth every 8 (eight) hours as needed for nausea. 20 tablet 2  . perphenazine (TRILAFON) 4 MG tablet Take 2 tablets (8 mg total) by mouth at bedtime. 60 tablet 0  . promethazine (PHENERGAN) 6.25 MG/5ML syrup TAKE 20 MLS BY MOUTH EVERY 8 HOURS AS NEEDED FOR NAUSEA. 473 mL 0  . promethazine-codeine (PHENERGAN WITH CODEINE) 6.25-10 MG/5ML syrup Take 10 mLs by mouth every 6 (six) hours as needed for cough. 180 mL 0  . SUBOXONE 8-2 MG FILM Take 1 Film by mouth 2 (two) times daily.    Derrill Memo ON 06/04/2017] Suvorexant (BELSOMRA) 20 MG TABS Take 1 tablet by mouth at bedtime. 30 tablet 0  . traMADol-acetaminophen (ULTRACET) 37.5-325 MG tablet Take 1 tablet by mouth every 12 (twelve) hours as needed. Do not take more than prescribed - can cause seizure or coma. 20 tablet 0   No current facility-administered medications on  file prior to visit.    Allergies  Allergen Reactions  . Lithium Nausea And Vomiting  . Celebrex [Celecoxib] Other (See Comments)    History of bleeding ulcers  . Morphine Itching  . Quetiapine Other (See Comments)     tardive dyskinesia  . Varenicline Tartrate Other (See Comments)    hallucinations   Past Surgical History:  Procedure Laterality Date  . BIOPSY THYROID  08-13-11   benign nodule, per Dr. Melida Quitter   . CATARACT EXTRACTION W/ INTRAOCULAR LENS  IMPLANT, BILATERAL Bilateral   . COLONOSCOPY    . DILATION AND CURETTAGE OF UTERUS    . ESOPHAGOGASTRODUODENOSCOPY  12-29-09   dr qadeer at [facility], several gastric ulcers  . ESOPHAGOGASTRODUODENOSCOPY N/A 09/25/2012   Procedure: ESOPHAGOGASTRODUODENOSCOPY (EGD);  Surgeon: Beryle Beams, MD;  Location: [facility];  Service: Endoscopy;  Laterality: N/A;  . FRACTURE SURGERY    . GASTROJEJUNOSTOMY  02-20-08   Roux en Y gastrojejunsotomy w 1 foot Roux limb  . GASTROSTOMY N/A 07/26/2014   Procedure: LAPRASCOPIC ASSISTED OPEN PLACEMENT OF JEJUNOSTOMY TUBE;  Surgeon: Pedro Earls, MD;  Location: [facility];  Service: General;  Laterality: N/A;  . HERNIA REPAIR     "hiatal"  . JOINT REPLACEMENT    . LAPAROSCOPIC NISSEN FUNDOPLICATION    . LAPAROSCOPIC PARTIAL GASTRECTOMY N/A 06/29/2013   Procedure: Gastrectomy and GJ Tube placement;  Surgeon: Pedro Earls, MD;  Location: [facility];  Service: General;  Laterality: N/A;  . ORIF HIP FRACTURE Bilateral    "pins in both of them"  . REPAIR OF PERFORATED ULCER    . TONSILLECTOMY     Family History  Problem Relation Age of Onset  . Cancer Mother        kidney, magliant tumor on face  . Stroke Mother   . Celiac disease Father   . Cancer Father        kidney and pancreatic   Social History   Socioeconomic History  . Marital status: Divorced    Spouse name: None  . Number of children: None  . Years of education: None  . Highest education level: None  Social Needs  . Financial  resource strain: None  . Food insecurity - worry: None  . Food insecurity - inability: None  . Transportation needs - medical: None  . Transportation needs - non-medical: None  Occupational History  . None  Tobacco Use  . Smoking status: Current Every Day Smoker    Packs/day: 2.00    Years: 50.00    Pack years: 100.00    Types: Cigarettes  . Smokeless tobacco: Never Used  Substance and Sexual Activity  . Alcohol use: No    Alcohol/week: 0.0 oz  . Drug use: No  . Sexual activity: No  Other Topics Concern  . None  Social History Narrative  . None   Depression screen [facility] 2/9 05/31/2017 05/26/2017 05/22/2017 05/22/2017 05/17/2017  Decreased Interest 0 3 - 3 0  Down, Depressed, Hopeless 0 1 2 2  0  PHQ - 2 Score 0 4 2 5  0  Altered sleeping - 2 - 3 -  Tired, decreased energy - 2 - 2 -  Change in appetite - 3 - 3 -  Feeling bad or failure about yourself  - 3 - 3 -  Trouble concentrating - 0 - 0 -  Moving slowly or fidgety/restless - 3 - 3 -  Suicidal thoughts - 0 - 0 -  PHQ-9 Score - 17 - 19 -  Difficult doing work/chores - Extremely dIfficult - Extremely dIfficult -  Some recent data might be hidden    Review of Systems  Gastrointestinal: Positive for abdominal pain and constipation.  Psychiatric/Behavioral: Positive for sleep disturbance.      Objective:   Physical Exam  Constitutional: She is oriented to person, place, and time. She appears well-developed and well-nourished. No distress.  HENT:  Head: Normocephalic and atraumatic.  Eyes: Conjunctivae and EOM are normal.  Neck: Neck supple. No tracheal deviation present.  Cardiovascular: Normal rate, regular rhythm, S1 normal, S2 normal and normal heart sounds.  Pulmonary/Chest: Effort normal. No respiratory distress.  Musculoskeletal: Normal range of motion.  Neurological: She is alert and oriented to person, place, and time.  Skin: Skin is warm and dry.  Psychiatric: She has a normal mood and affect. Her behavior is  normal.  Nursing note and vitals reviewed.  BP 118/68   Pulse 67   Temp (!) 97.5 F (36.4 C) (Oral)   Resp 16   Ht 5' 2.99" (1.6 m)   Wt 108 lb (49 kg)   SpO2 97%   BMI 19.14 kg/m     Assessment & Plan:   1. Gastroparesis secondary to hemigastrectomy   2. Abdominal pain, chronic, epigastric   3. Constipation, chronic   4. Special screening for malignant neoplasms, colon   5. Insomnia due to medical condition   6. High risk medication use   7. Episodic polysubstance dependence ([place])     Orders Placed This Encounter  Procedures  . Ambulatory referral to Gastroenterology    Referral Priority:   Routine    Referral Type:   Consultation    Referral Reason:   Specialty Services Required    Number of Visits Requested:   1    Meds ordered this encounter  Medications  . traZODone (DESYREL) 150 MG tablet    Sig: Take 1 tablet (150 mg total) by mouth at bedtime.    Dispense:  30 tablet    Refill:  0  . sucralfate (CARAFATE) 1 g tablet    Sig: Take 1 tablet (1 g total) by mouth 4 (four) times daily -  with meals and at bedtime.    Dispense:  120 tablet    Refill:  0    I personally performed the services described in this documentation, which was scribed in my presence. The recorded information has been reviewed and considered, and addended by me as needed.   [name], M.D.  Primary Care at [facility] [address] [place], [place] [address] [phone] phone [phone] fax  06/03/17 8:59 AM

## 2017-05-31 NOTE — Patient Instructions (Signed)
     IF you received an x-ray today, you will receive an invoice from [facility]. Please contact [facility] at [phone] with questions or concerns regarding your invoice.   IF you received labwork today, you will receive an invoice from [facility]. Please contact [facility] at [phone] with questions or concerns regarding your invoice.   Our billing staff will not be able to assist you with questions regarding bills from these companies.  You will be contacted with the lab results as soon as they are available. The fastest way to get your results is to activate your My Chart account. Instructions are located on the last page of this paperwork. If you have not heard from us regarding the results in 2 weeks, please contact this office.     

## 2017-06-04 DIAGNOSIS — G8929 Other chronic pain: Secondary | ICD-10-CM | POA: Diagnosis not present

## 2017-06-04 DIAGNOSIS — R1084 Generalized abdominal pain: Secondary | ICD-10-CM | POA: Diagnosis not present

## 2017-06-04 DIAGNOSIS — R109 Unspecified abdominal pain: Secondary | ICD-10-CM | POA: Diagnosis not present

## 2017-06-04 DIAGNOSIS — K219 Gastro-esophageal reflux disease without esophagitis: Secondary | ICD-10-CM | POA: Diagnosis not present

## 2017-06-05 ENCOUNTER — Telehealth: Payer: Self-pay | Admitting: Family Medicine

## 2017-06-05 NOTE — Telephone Encounter (Signed)
Please advise 

## 2017-06-05 NOTE — Telephone Encounter (Signed)
Copied from [place]. Topic: Quick Communication - See Telephone Encounter >> Jun 05, 2017  8:43 AM Robina Ade, Helene Kelp D wrote: CRM for notification. See Telephone encounter for: 06/05/17. Patient called and wanted to check-up on the status of the letter she requested Dr. Brigitte Pulse for her to be able to take out money from her trust funds. Please call patient at [phone] when it's ready for her to pick-up, thanks.

## 2017-06-05 NOTE — Telephone Encounter (Signed)
Pt request for letter sent to Dr. Brigitte Pulse Chart reviewed-did not see notes re: letter.

## 2017-06-06 ENCOUNTER — Ambulatory Visit: Payer: Self-pay | Admitting: Pharmacist

## 2017-06-06 NOTE — Telephone Encounter (Signed)
Will do next week - by Fri 12/21.

## 2017-06-09 ENCOUNTER — Other Ambulatory Visit: Payer: Self-pay | Admitting: Pharmacist

## 2017-06-09 NOTE — Patient Outreach (Signed)
[facility] [facility]) Care Management  06/09/2017  [name] November 23, 1950 [id number]   Patient was called to follow up on the missed appointment from 05/30/17. HIPAA identifiers were obtained. Patient confirmed she cannot read her medications over the phone.  Another home visit was schedule for Friday 06/13/17 at 10:00am.   Elayne Guerin, PharmD, [place] Clinical Pharmacist [phone]

## 2017-06-10 ENCOUNTER — Other Ambulatory Visit: Payer: Self-pay | Admitting: *Deleted

## 2017-06-10 ENCOUNTER — Ambulatory Visit: Payer: Self-pay | Admitting: *Deleted

## 2017-06-10 NOTE — Patient Outreach (Signed)
[place] [facility]) Care Management  06/10/2017  [name] March 11, 1951 [id number]   CSW called patient this morning, prior to the scheduled appointment time (Tuesday, June 10, 2017 at 10:00 AM), to confirm the initial home visit; however, patient requested to reschedule.  The initial home visit has been rescheduled for Wednesday, June 18, 2017 at 9:30 AM, per patient's request. [name], BSW, MSW, [place]  Licensed Clinical Social Worker  [place]  Mailing [place]. [address] [place], [place] [address] Physical Address-[address], [place], [place] [address] Toll Free Main # [phone] Fax # [phone] Cell # [phone]  Office # [phone] [email or web address]

## 2017-06-13 ENCOUNTER — Ambulatory Visit: Payer: Self-pay | Admitting: Pharmacist

## 2017-06-18 ENCOUNTER — Other Ambulatory Visit: Payer: Self-pay | Admitting: *Deleted

## 2017-06-18 ENCOUNTER — Encounter: Payer: Self-pay | Admitting: Pharmacist

## 2017-06-18 ENCOUNTER — Other Ambulatory Visit: Payer: Self-pay | Admitting: Pharmacist

## 2017-06-18 NOTE — Patient Outreach (Addendum)
[place] [place] [facility]) Care Management  [place]   06/18/2017  [name] [birth year] [id number]  Subjective: Home visit complete at the patient's home.  Her roommate [name] was there for the visit. Patient is a 66 year old female with multiple medical conditions including but not limited to:  COPD, anemia, depression, chronic nausea, opioid dependence, CAD, gastroparesis, osteoporosis, vitamin D deficiency, and current tobacco abuse.  Patient uses [employer]. Her roommate manages her medications as the patient is visually impaired.  Objective:   Encounter Medications: Outpatient Encounter Medications as of 06/18/2017  Medication Sig  . albuterol (PROVENTIL HFA;VENTOLIN HFA) 108 (90 Base) MCG/ACT inhaler Inhale 2 puffs into the lungs every 4 (four) hours as needed (cough, shortness of breath or wheezing.).  Marland Kitchen busPIRone (BUSPAR) 30 MG tablet Take 1 tablet (30 mg total) by mouth 2 (two) times daily.  . furosemide (LASIX) 20 MG tablet TAKE 1 TABLET ONCE DAILY AS NEEDED FOR EDEMA.  Marland Kitchen HYDROcodone-acetaminophen (NORCO) 10-325 MG tablet Take 1 tablet every 8 (eight) hours as needed by mouth.  . nabumetone (RELAFEN) 750 MG tablet Take 1 tablet by mouth 2 (two) times daily.  Marland Kitchen omeprazole (PRILOSEC) 40 MG capsule   . ondansetron (ZOFRAN-ODT) 8 MG disintegrating tablet Take 1 tablet (8 mg total) by mouth every 8 (eight) hours as needed for nausea.  . promethazine (PHENERGAN) 6.25 MG/5ML syrup Take 20 mLs by mouth every 8 (eight) hours as needed for nausea.  . sucralfate (CARAFATE) 1 g tablet Take 1 tablet (1 g total) by mouth 4 (four) times daily -  with meals and at bedtime.  . Suvorexant (BELSOMRA) 20 MG TABS Take 1 tablet by mouth at bedtime.  . traZODone (DESYREL) 150 MG tablet Take 1 tablet (150 mg total) by mouth at bedtime.  . DULoxetine (CYMBALTA) 30 MG capsule Take 90 mg by mouth daily.  . [DISCONTINUED] DULoxetine (CYMBALTA) 60 MG capsule TAKE (1) CAPSULE  DAILY. (Patient not taking: Reported on 06/18/2017)  . [DISCONTINUED] promethazine (PHENERGAN) 6.25 MG/5ML syrup TAKE 20 MLS BY MOUTH EVERY 8 HOURS AS NEEDED FOR NAUSEA. (Patient not taking: Reported on 06/18/2017)  . [DISCONTINUED] promethazine-codeine (PHENERGAN WITH CODEINE) 6.25-10 MG/5ML syrup Take 10 mLs by mouth every 6 (six) hours as needed for cough. (Patient not taking: Reported on 06/18/2017)   No facility-administered encounter medications on file as of 06/18/2017.     Functional Status: In your present state of health, do you have any difficulty performing the following activities: 05/26/2017 05/01/2017  Hearing? N N  Vision? N Y  Difficulty concentrating or making decisions? N N  Walking or climbing stairs? Y Y  Dressing or bathing? N N  Doing errands, shopping? [name]  Preparing Food and eating ? N N  Using the Toilet? N N  In the past six months, have you accidently leaked urine? N N  Do you have problems with loss of bowel control? N N  Managing your Medications? Y N  Managing your Finances? [name]  Housekeeping or managing your Housekeeping? Y Y  Some recent data might be hidden    Fall/Depression Screening: Fall Risk  05/31/2017 05/26/2017 05/22/2017  Falls in the past year? No No No  Risk for fall due to : - - -   PHQ 2/9 Scores 05/31/2017 05/26/2017 05/22/2017 05/22/2017 05/17/2017 05/10/2017 05/01/2017  PHQ - 2 Score 0 4 2 5  0 0 1  PHQ- 9 Score - 17 - 19 - - -  Exception  Documentation - - - - - Medical reason Medical reason    Today's Vitals   06/18/17 1137  PainSc: 4      Assessment:  Drugs sorted by system:  Neurologic/Psychologic: Duloxetine Buspirone Trazodone Belsomra  Cardiovascular: furosemide  Pulmonary/Allergy: Albuterol HFA  Gastrointestinal: Omeprazole Sucralfate Promethazine Ondansetron ODT  Pain: Hydrocodone/APAP Nabumetone   Medication review findings:  Adherence: Duloxetine-patient did not have in her possession. [name] was called. Duloxetine 60 1 capsule daily was last filled in June of this year. However, a new prescription for Duloxetine 30mg  3 capsules daily was called in by [name] office on 06/07/17.  The prescription was not filled because it required a prior authorization and [facility] said they did not hear back from [name] office.  [name] office was called. The duloxetine dose was clarified at 30mg  3 capsules daily and a new fax number was obtained and was provided to [facility] as [name] nurse said they did not receive a PA.  Perphenazine-was discontinued at [name] last visit. Patient still had it with her active medications but it was a full bottle filled in September.  Patient was advised not to take it as it was discontinued.  Psych Therapy Patient is not currently seeing a therapist or psychiatrist. She is taking Belsomra, Trazodone, Bupirone and has been prescribed high dose duloxetine. (need for psychiatric provider was discussed with [facility] Social Worker today)  Plan: Note will be sent to [name] with patient's [facility] Social Worker, [name] via telephone to consult Follow up on the duloxetine PA Close patient's case after duloxetine dose is sorted.  [name], PharmD, [place] Clinical Pharmacist [phone]

## 2017-06-18 NOTE — Patient Outreach (Signed)
[place] [facility]) Care Management  06/18/2017  MEZTLI LLANAS 08-Nov-1950 [id number]   CSW drove out to patient's home today to try and perform the initial home visit; however, patient's roommate and friend, [name] reported that she and patient are both sick, needing to reschedule the home visit.  CSW was able to accommodate, rescheduling the initial home visit for Friday, June 27, 2017 at 10:30AM.   [name], BSW, MSW, [place]  Licensed Clinical Social Worker  [place]  Mailing [place] [address], [place], [place] [address] Physical Address-[address], [place], [place] [address] Toll Free Main # [phone] Fax # [phone] Cell # [phone]  Office # [phone] [email or web address]

## 2017-06-19 ENCOUNTER — Other Ambulatory Visit: Payer: Self-pay | Admitting: Pharmacist

## 2017-06-19 DIAGNOSIS — R1084 Generalized abdominal pain: Secondary | ICD-10-CM | POA: Diagnosis not present

## 2017-06-19 NOTE — Patient Outreach (Signed)
[place] [facility]) Care Management  06/19/2017  [name] 1950-07-08 [id number]  [place] to see if the prior authorization for duloxetine had been completed. Unfortunately, it had not. Called Dr. Rozann Lesches office and spoke with a nurse. She found the prior authorization sent from [facility] and said she would forward it to Dr. Rozann Lesches CMA.  Patient was called and informed of the status of the PA.  Plan: Follow up on the PA in 1-3 business days.  Elayne Guerin, PharmD, [place] Clinical Pharmacist [phone]

## 2017-06-20 DIAGNOSIS — J449 Chronic obstructive pulmonary disease, unspecified: Secondary | ICD-10-CM | POA: Diagnosis not present

## 2017-06-23 ENCOUNTER — Other Ambulatory Visit: Payer: Self-pay | Admitting: Pharmacist

## 2017-06-23 ENCOUNTER — Other Ambulatory Visit: Payer: Self-pay | Admitting: Gastroenterology

## 2017-06-23 DIAGNOSIS — K259 Gastric ulcer, unspecified as acute or chronic, without hemorrhage or perforation: Secondary | ICD-10-CM | POA: Diagnosis not present

## 2017-06-23 DIAGNOSIS — R1033 Periumbilical pain: Secondary | ICD-10-CM | POA: Diagnosis not present

## 2017-06-23 DIAGNOSIS — R1013 Epigastric pain: Secondary | ICD-10-CM | POA: Diagnosis not present

## 2017-06-23 NOTE — Patient Outreach (Signed)
[place] [facility]) Care Management  06/23/2017  [name] 1950-07-18 [id number]  Patient was called to follow up on duloxetine. Unfortunately, [facility] reported Dr. Rozann Lesches office did not get the PA to go through for Duloxetine 30mg  3 capsules daily.    The pharmacist said they could get two separate prescriptions for duloxetine 30mg  1 capsule daily and Duloxetine 60mg  1 capsule daily to go through.  The cost of both prescriptions was $5.45 and $5.67 respectively.  Patient said the price was agreeable.  Patient's medication list was corrected to reflect the most recent changes.  Plan: Follow up with patient in 5-7 days to be sure she picked up the prescription and is not having adverse effects.  Elayne Guerin, PharmD, [place] Clinical Pharmacist [phone]

## 2017-06-26 DIAGNOSIS — Z79899 Other long term (current) drug therapy: Secondary | ICD-10-CM | POA: Diagnosis not present

## 2017-06-26 DIAGNOSIS — K5909 Other constipation: Secondary | ICD-10-CM | POA: Diagnosis not present

## 2017-06-26 DIAGNOSIS — R109 Unspecified abdominal pain: Secondary | ICD-10-CM | POA: Diagnosis not present

## 2017-06-26 DIAGNOSIS — K219 Gastro-esophageal reflux disease without esophagitis: Secondary | ICD-10-CM | POA: Diagnosis not present

## 2017-06-27 ENCOUNTER — Ambulatory Visit (HOSPITAL_COMMUNITY): Payer: Medicare Other | Admitting: Anesthesiology

## 2017-06-27 ENCOUNTER — Encounter (HOSPITAL_COMMUNITY)
Admission: RE | Disposition: A | Discharge status: Home / Self Care | Payer: Self-pay | Source: Ambulatory Visit | Attending: Gastroenterology

## 2017-06-27 ENCOUNTER — Other Ambulatory Visit: Payer: Self-pay

## 2017-06-27 ENCOUNTER — Other Ambulatory Visit: Payer: Self-pay | Admitting: Gastroenterology

## 2017-06-27 ENCOUNTER — Ambulatory Visit: Payer: Self-pay | Admitting: *Deleted

## 2017-06-27 ENCOUNTER — Encounter (HOSPITAL_COMMUNITY): Payer: Self-pay

## 2017-06-27 ENCOUNTER — Ambulatory Visit (HOSPITAL_COMMUNITY)
Admission: RE | Admit: 2017-06-27 | Discharge: 2017-06-27 | Disposition: A | Discharge status: Home / Self Care | Payer: Medicare Other | Source: Ambulatory Visit | Attending: Gastroenterology | Admitting: Gastroenterology

## 2017-06-27 DIAGNOSIS — F419 Anxiety disorder, unspecified: Secondary | ICD-10-CM | POA: Diagnosis not present

## 2017-06-27 DIAGNOSIS — Z9181 History of falling: Secondary | ICD-10-CM | POA: Insufficient documentation

## 2017-06-27 DIAGNOSIS — Z8379 Family history of other diseases of the digestive system: Secondary | ICD-10-CM | POA: Insufficient documentation

## 2017-06-27 DIAGNOSIS — J45909 Unspecified asthma, uncomplicated: Secondary | ICD-10-CM | POA: Insufficient documentation

## 2017-06-27 DIAGNOSIS — K449 Diaphragmatic hernia without obstruction or gangrene: Secondary | ICD-10-CM | POA: Insufficient documentation

## 2017-06-27 DIAGNOSIS — Z980 Intestinal bypass and anastomosis status: Secondary | ICD-10-CM | POA: Diagnosis not present

## 2017-06-27 DIAGNOSIS — Z681 Body mass index (BMI) 19 or less, adult: Secondary | ICD-10-CM | POA: Diagnosis not present

## 2017-06-27 DIAGNOSIS — H3530 Unspecified macular degeneration: Secondary | ICD-10-CM | POA: Insufficient documentation

## 2017-06-27 DIAGNOSIS — Z823 Family history of stroke: Secondary | ICD-10-CM | POA: Diagnosis not present

## 2017-06-27 DIAGNOSIS — J449 Chronic obstructive pulmonary disease, unspecified: Secondary | ICD-10-CM | POA: Diagnosis not present

## 2017-06-27 DIAGNOSIS — Z888 Allergy status to other drugs, medicaments and biological substances status: Secondary | ICD-10-CM | POA: Insufficient documentation

## 2017-06-27 DIAGNOSIS — F329 Major depressive disorder, single episode, unspecified: Secondary | ICD-10-CM | POA: Insufficient documentation

## 2017-06-27 DIAGNOSIS — Z8051 Family history of malignant neoplasm of kidney: Secondary | ICD-10-CM | POA: Insufficient documentation

## 2017-06-27 DIAGNOSIS — Z809 Family history of malignant neoplasm, unspecified: Secondary | ICD-10-CM | POA: Diagnosis not present

## 2017-06-27 DIAGNOSIS — Z8719 Personal history of other diseases of the digestive system: Secondary | ICD-10-CM | POA: Diagnosis not present

## 2017-06-27 DIAGNOSIS — K2270 Barrett's esophagus without dysplasia: Secondary | ICD-10-CM | POA: Diagnosis not present

## 2017-06-27 DIAGNOSIS — Z9842 Cataract extraction status, left eye: Secondary | ICD-10-CM | POA: Diagnosis not present

## 2017-06-27 DIAGNOSIS — M160 Bilateral primary osteoarthritis of hip: Secondary | ICD-10-CM | POA: Diagnosis not present

## 2017-06-27 DIAGNOSIS — K3184 Gastroparesis: Secondary | ICD-10-CM | POA: Diagnosis not present

## 2017-06-27 DIAGNOSIS — Z800 Family history of malignant neoplasm of digestive organs: Secondary | ICD-10-CM | POA: Insufficient documentation

## 2017-06-27 DIAGNOSIS — Z9660 Presence of unspecified orthopedic joint implant: Secondary | ICD-10-CM | POA: Diagnosis not present

## 2017-06-27 DIAGNOSIS — G8929 Other chronic pain: Secondary | ICD-10-CM | POA: Insufficient documentation

## 2017-06-27 DIAGNOSIS — R109 Unspecified abdominal pain: Secondary | ICD-10-CM | POA: Diagnosis not present

## 2017-06-27 DIAGNOSIS — Z885 Allergy status to narcotic agent status: Secondary | ICD-10-CM | POA: Insufficient documentation

## 2017-06-27 DIAGNOSIS — M1990 Unspecified osteoarthritis, unspecified site: Secondary | ICD-10-CM | POA: Insufficient documentation

## 2017-06-27 DIAGNOSIS — Z9841 Cataract extraction status, right eye: Secondary | ICD-10-CM | POA: Insufficient documentation

## 2017-06-27 DIAGNOSIS — M810 Age-related osteoporosis without current pathological fracture: Secondary | ICD-10-CM | POA: Diagnosis not present

## 2017-06-27 DIAGNOSIS — I252 Old myocardial infarction: Secondary | ICD-10-CM | POA: Insufficient documentation

## 2017-06-27 DIAGNOSIS — D509 Iron deficiency anemia, unspecified: Secondary | ICD-10-CM | POA: Diagnosis not present

## 2017-06-27 DIAGNOSIS — R1011 Right upper quadrant pain: Secondary | ICD-10-CM

## 2017-06-27 DIAGNOSIS — R634 Abnormal weight loss: Secondary | ICD-10-CM | POA: Insufficient documentation

## 2017-06-27 DIAGNOSIS — F17210 Nicotine dependence, cigarettes, uncomplicated: Secondary | ICD-10-CM | POA: Insufficient documentation

## 2017-06-27 DIAGNOSIS — I2510 Atherosclerotic heart disease of native coronary artery without angina pectoris: Secondary | ICD-10-CM | POA: Insufficient documentation

## 2017-06-27 DIAGNOSIS — K219 Gastro-esophageal reflux disease without esophagitis: Secondary | ICD-10-CM | POA: Insufficient documentation

## 2017-06-27 DIAGNOSIS — R1013 Epigastric pain: Secondary | ICD-10-CM

## 2017-06-27 HISTORY — PX: ESOPHAGOGASTRODUODENOSCOPY (EGD) WITH PROPOFOL: SHX5813

## 2017-06-27 SURGERY — ESOPHAGOGASTRODUODENOSCOPY (EGD) WITH PROPOFOL
Anesthesia: Monitor Anesthesia Care

## 2017-06-27 MED ORDER — PROPOFOL 500 MG/50ML IV EMUL
INTRAVENOUS | Status: DC | PRN
  Administered 2017-06-27: 140 ug/kg/min via INTRAVENOUS

## 2017-06-27 MED ORDER — PROPOFOL 10 MG/ML IV BOLUS
INTRAVENOUS | Status: AC
  Filled 2017-06-27: qty 40

## 2017-06-27 MED ORDER — PROPOFOL 10 MG/ML IV BOLUS
INTRAVENOUS | Status: DC | PRN
  Administered 2017-06-27 (×2): 20 mg via INTRAVENOUS

## 2017-06-27 MED ORDER — LIDOCAINE 2% (20 MG/ML) 5 ML SYRINGE
INTRAMUSCULAR | Status: DC | PRN
  Administered 2017-06-27: 100 mg via INTRAVENOUS

## 2017-06-27 MED ORDER — LACTATED RINGERS IV SOLN
INTRAVENOUS | Status: DC
  Administered 2017-06-27: 10:00:00 via INTRAVENOUS

## 2017-06-27 SURGICAL SUPPLY — 14 items

## 2017-06-27 NOTE — Anesthesia Procedure Notes (Signed)
Performed by: Ericha Whittingham L, CRNA Oxygen Delivery Method: Nasal cannula       

## 2017-06-27 NOTE — H&P (Signed)
[name] HPI: She has a history of gastroparesis that was not responsive to metoclopramide and the gastroparesis diet, but she is also s/p gastrojejunostomy with a history of an anastamotic stricture as well as chronic ulcerations. Her mother died last year and she has not felt well since her mother's passing. She did not have a good relationship with her mother, but she has not recovered with her weight loss. Her last EGD was in 2014 with findings of chronic ulcerations. The patient also reports having pain in her abdomen right before eating. PO intake also causes her abdominal pain. She still smokes and she is at 1 pack per day.   Past Medical History:  Diagnosis Date  . ACUTE DUODEN ULCER W/HEMORR PERF&OBSTRUCTION 06/26/2004   Qualifier: Diagnosis of  By: [name] MD, [name]   . Acute duodenal ulcer with hemorrhage and perforation, with obstruction ([place])   . Allergy   . Anemia   . Anginal pain ([place])    "many many years ago"  . Anxiety    sees [name] NP at Dr. Radonna Ricker office  . Arthritis    "hips" (09/06/2014)  . Asthma   . Barrett's esophagus   . Chronic abdominal pain    narcotic dependence, dr gyarteng-dak at [facility]  . 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 Complication of anesthesia    pt states had too much xanax on board - agitated   . COPD (chronic obstructive pulmonary disease) ([place])   . Depression   . Exertional shortness of breath   . Fall 11/23/2014   frequent falls  . FEVER, RECURRENT 08/11/2009   Qualifier: Diagnosis of  By: [name] MD, [name]   . Fx of fibula 07-28-11   left fibula, 2 places   . Fx two ribs-open 08-24-11   left 4th and 5th  . GASTRIC OUTLET OBSTRUCTION 08/28/2007   In July 2008 she underwent laparoscopic enterolysis, Nissen fundoplication over a #29 bougie, single pledgeted suture colosure of the hiatus and a 3 suture wrap.  Lap truncal vagotomy and a loop gastrojejunostomy was performed.  This was  revised in August of 2009 to a roux en Y gastrojejujostomy.     . Gastroparesis 06/03/2007   Qualifier: Diagnosis of  By: [name] MD, [name]   . GERD (gastroesophageal reflux disease)   . Headache    "monthly" (09/06/2014)  . History of blood transfusion    "related to OR; maybe when I perforated that ulcer"  . History of hiatal hernia   . Lap Nissen + truncal vagotomy July 2008 05/13/2013  . LEG EDEMA 01/19/2008   Qualifier: Diagnosis of  By: [name] MD, [name]   . Macular degeneration, bilateral    early onset, severe, legally blind and worsening. Followed by dr. Katy Fitch  . Menopause   . MENOPAUSE 01/07/2007   Qualifier: Diagnosis of  By: [name], CMA, [place]    . Myocardial infarction ([place])   . Narcotic abuse ([place])   . Osteoporosis    severe  . Oxygen deficiency   . Peptic ulcer disease    dr Oletta Lamas  . Pneumonia, organism unspecified([place]) 11/07/2010   Assoc with R Parapneumonic effusion 09/2010    - Tapped 10/05/10    - CxR resolved 11/07/2010    . S/P jejunostomy Feb 2016 07/26/2014    Past Surgical History:  Procedure Laterality Date  . BIOPSY THYROID  08-13-11   benign nodule, per Dr. Melida Quitter   .  CATARACT EXTRACTION W/ INTRAOCULAR LENS  IMPLANT, BILATERAL Bilateral   . COLONOSCOPY    . DILATION AND CURETTAGE OF UTERUS    . ESOPHAGOGASTRODUODENOSCOPY  12-29-09   dr qadeer at [facility], several gastric ulcers  . ESOPHAGOGASTRODUODENOSCOPY N/A 09/25/2012   Procedure: ESOPHAGOGASTRODUODENOSCOPY (EGD);  Surgeon: Beryle Beams, MD;  Location: [facility];  Service: Endoscopy;  Laterality: N/A;  . FRACTURE SURGERY    . GASTROJEJUNOSTOMY  02-20-08   Roux en Y gastrojejunsotomy w 1 foot Roux limb  . GASTROSTOMY N/A 07/26/2014   Procedure: LAPRASCOPIC ASSISTED OPEN PLACEMENT OF JEJUNOSTOMY TUBE;  Surgeon: Pedro Earls, MD;  Location: [facility];  Service: General;  Laterality: N/A;  . HERNIA REPAIR     "hiatal"  . JOINT REPLACEMENT    . LAPAROSCOPIC NISSEN FUNDOPLICATION    . LAPAROSCOPIC  PARTIAL GASTRECTOMY N/A 06/29/2013   Procedure: Gastrectomy and GJ Tube placement;  Surgeon: Pedro Earls, MD;  Location: [facility];  Service: General;  Laterality: N/A;  . ORIF HIP FRACTURE Bilateral    "pins in both of them"  . REPAIR OF PERFORATED ULCER    . TONSILLECTOMY      Family History  Problem Relation Age of Onset  . Cancer Mother        kidney, magliant tumor on face  . Stroke Mother   . Celiac disease Father   . Cancer Father        kidney and pancreatic    Social History:  reports that she has been smoking cigarettes.  She has a 100.00 pack-year smoking history. she has never used smokeless tobacco. She reports that she does not drink alcohol or use drugs.  Allergies:  Allergies  Allergen Reactions  . Lithium Nausea And Vomiting  . Celebrex [Celecoxib] Other (See Comments)    History of bleeding ulcers  . Morphine Itching  . Quetiapine Other (See Comments)     tardive dyskinesia  . Varenicline Tartrate Other (See Comments)    hallucinations    Medications:  Scheduled:  Continuous: . lactated ringers      No results found for this or any previous visit (from the past 24 hour(s)).   No results found.  ROS:  As stated above in the HPI otherwise negative.  Blood pressure 110/63, pulse 78, temperature 98.3 F (36.8 C), temperature source Oral, resp. rate 18, height 5\' 2"  (1.575 m), weight 48.5 kg (107 lb), SpO2 96 %.    PE: Gen: NAD, Alert and Oriented HEENT:  /AT, EOMI Neck: Supple, no LAD Lungs: CTA Bilaterally CV: RRR without M/G/R ABM: Soft, NTND, +BS Ext: No C/C/E  Assessment/Plan: 1) History of gastric ulcers. 2) ABM pain.  Plan: 1) EGD today.  Joelie Schou D 06/27/2017, 9:23 AM

## 2017-06-27 NOTE — Discharge Instructions (Signed)

## 2017-06-27 NOTE — Op Note (Signed)
[facility] Patient Name: [name] Procedure Date: 06/27/2017 MRN: [id number] Attending MD: Carol Ada , MD Date of Birth: May 02, 1951 CSN: [id number] Age: 67 Admit Type: Outpatient Procedure:                Upper GI endoscopy Indications:              Epigastric abdominal pain, Weight loss Providers:                Carol Ada, MD, Laverta Baltimore RN, RN, Cherylynn Ridges, Technician, Danley Danker, CRNA Referring MD:              Medicines:                Propofol per Anesthesia Complications:            No immediate complications. Estimated Blood Loss:     Estimated blood loss: none. Procedure:                Pre-Anesthesia Assessment:                           - Prior to the procedure, a History and Physical                            was performed, and patient medications and                            allergies were reviewed. The patient's tolerance of                            previous anesthesia was also reviewed. The risks                            and benefits of the procedure and the sedation                            options and risks were discussed with the patient.                            All questions were answered, and informed consent                            was obtained. Prior Anticoagulants: The patient has                            taken no previous anticoagulant or antiplatelet                            agents. ASA Grade Assessment: III - A patient with                            severe systemic disease. After reviewing the risks  and benefits, the patient was deemed in                            satisfactory condition to undergo the procedure.                           - Sedation was administered by an anesthesia                            professional. Deep sedation was attained.                           After obtaining informed consent, the endoscope was     passed under direct vision. Throughout the                            procedure, the patient's blood pressure, pulse, and                            oxygen saturations were monitored continuously. The                            EG-[id number] ([id number]) scope was introduced through the                            mouth, and advanced to the afferent and efferent                            jejunal loops. The upper GI endoscopy was                            accomplished without difficulty. The patient                            tolerated the procedure well. Scope In: Scope Out: Findings:      The esophagus was normal.      Evidence of a gastrojejunostomy was found in the gastric body. This was       characterized by healthy appearing mucosa and an intact appearance.      The examined jejunum was normal.      The prior ulcerations were healed. There was a large scar from the       largest ulcer in the past. No significant retention of gastric contents       to suggest gastroparesis at this time. Deep intubation of the efferent       limb was achieved. Impression:               - Normal esophagus.                           - A gastrojejunostomy was found, characterized by                            an intact appearance and healthy appearing mucosa.                           -  Normal examined jejunum.                           - No specimens collected. Moderate Sedation:      N/A- Per Anesthesia Care Recommendation:           - Patient has a contact number available for                            emergencies. The signs and symptoms of potential                            delayed complications were discussed with the                            patient. Return to normal activities tomorrow.                            Written discharge instructions were provided to the                            patient.                           - Resume previous diet.                           - Continue present  medications.                           - Small frequent meals. Procedure Code(s):        --- Professional ---                           [id number], Esophagogastroduodenoscopy, flexible,                            transoral; diagnostic, including collection of                            specimen(s) by brushing or washing, when performed                            (separate procedure) Diagnosis Code(s):        --- Professional ---                           R10.13, Epigastric pain                           Z98.0, Intestinal bypass and anastomosis status                           R63.4, Abnormal weight loss CPT copyright 2016 American Medical Association. All rights reserved. The codes documented in this report are preliminary and upon coder review may  be revised to meet current compliance requirements. Carol Ada, MD Carol Ada, MD 06/27/2017 10:15:00 AM This report has been signed electronically.  Number of Addenda: 0 

## 2017-06-27 NOTE — Anesthesia Preprocedure Evaluation (Addendum)
Anesthesia Evaluation  Patient identified by MRN, date of birth, ID band Patient awake    Airway Mallampati: II  TM Distance: >3 FB Neck ROM: Full    Dental no notable dental hx. (+) Edentulous Lower, Edentulous Upper   Pulmonary asthma , COPD, Current Smoker,    Pulmonary exam normal breath sounds clear to auscultation       Cardiovascular + angina + CAD and + Past MI  Normal cardiovascular exam Rhythm:Regular Rate:Normal     Neuro/Psych    GI/Hepatic hiatal hernia, GERD  ,  Endo/Other    Renal/GU      Musculoskeletal  (+) Arthritis , Osteoarthritis,    Abdominal   Peds  Hematology   Anesthesia Other Findings   Reproductive/Obstetrics                           Anesthesia Physical Anesthesia Plan  ASA: III  Anesthesia Plan: MAC   Post-op Pain Management:    Induction: Intravenous  PONV Risk Score and Plan: 1 and Treatment may vary due to age or medical condition  Airway Management Planned:   Additional Equipment:   Intra-op Plan:   Post-operative Plan:   Informed Consent: I have reviewed the patients History and Physical, chart, labs and discussed the procedure including the risks, benefits and alternatives for the proposed anesthesia with the patient or authorized representative who has indicated his/her understanding and acceptance.     Plan Discussed with: CRNA  Anesthesia Plan Comments:         Anesthesia Quick Evaluation

## 2017-06-27 NOTE — Anesthesia Postprocedure Evaluation (Signed)
Anesthesia Post Note  Patient: [name]  Procedure(s) Performed: ESOPHAGOGASTRODUODENOSCOPY (EGD) WITH PROPOFOL (N/A )     Patient location during evaluation: PACU Anesthesia Type: MAC Level of consciousness: awake and alert Pain management: pain level controlled Vital Signs Assessment: post-procedure vital signs reviewed and stable Respiratory status: spontaneous breathing, nonlabored ventilation, respiratory function stable and patient connected to nasal cannula oxygen Cardiovascular status: stable and blood pressure returned to baseline Postop Assessment: no apparent nausea or vomiting Anesthetic complications: no    Last Vitals:  Vitals:   06/27/17 1035 06/27/17 1040  BP: 103/63 (P) 113/65  Pulse:    Resp: 18 (!) 21  Temp:    SpO2:  (P) 95%    Last Pain:  Vitals:   06/27/17 1010  TempSrc: Oral                 Barnet Glasgow

## 2017-06-27 NOTE — Transfer of Care (Signed)
Immediate Anesthesia Transfer of Care Note  Patient: [name]  Procedure(s) Performed: ESOPHAGOGASTRODUODENOSCOPY (EGD) WITH PROPOFOL (N/A )  Patient Location: Endoscopy Unit  Anesthesia Type:MAC  Level of Consciousness: awake, alert  and oriented  Airway & Oxygen Therapy: Patient Spontanous Breathing and Patient connected to nasal cannula oxygen  Post-op Assessment: Report given to RN and Post -op Vital signs reviewed and stable  Post vital signs: Reviewed and stable  Last Vitals:  Vitals:   06/27/17 0853  BP: 110/63  Pulse: 78  Resp: 18  Temp: 36.8 C  SpO2: 96%    Last Pain:  Vitals:   06/27/17 0853  TempSrc: Oral         Complications: No apparent anesthesia complications

## 2017-06-30 ENCOUNTER — Encounter (HOSPITAL_COMMUNITY): Payer: Self-pay | Admitting: Gastroenterology

## 2017-07-01 ENCOUNTER — Other Ambulatory Visit: Payer: Self-pay | Admitting: *Deleted

## 2017-07-01 NOTE — Patient Outreach (Signed)
[facility]) Care Management  07/01/2017  [name] 1950-10-07 [id number]   CSW was scheduled to meet with patient today at her home to perform the initial home visit; however, CSW was told that patient was sick and unable to perform the visit today by patient's friend/roommate.  This was only after CSW drove out to patient's home, for the third time, to try and perform the initial visit.  CSW was able to ensure that patient and patient's roommate, [name] have the correct contact information for CSW, encouraging them to contact CSW directly when patient is ready to proceed with case management services.  Ms. [name] voiced understanding, explaining that she would relay the message to patient.  CSW will perform a case closure, until further notice.  CSW will submit a case closure request to Alycia Rossetti, Care Management Assistant with [facility], in the form of an In [employer].  CSW will ensure that Mrs. [name] is aware of Programmer, applications, also with [employer], continued involvement with patient's care.  CSW will fax an update to patient's Primary Care Physician, Dr. [name]. [name], BSW, MSW, LCSW  Licensed Education officer, [facility]  Mailing [place] [address], [place], [place] [address] Physical Address-[address], [place], [place] [address] Toll Free Main # [phone] Fax # [phone] Cell # [phone]  Office # [phone] [email or web address]

## 2017-07-02 ENCOUNTER — Other Ambulatory Visit: Payer: Self-pay | Admitting: Gastroenterology

## 2017-07-02 ENCOUNTER — Other Ambulatory Visit: Payer: Self-pay

## 2017-07-02 ENCOUNTER — Ambulatory Visit
Admission: RE | Admit: 2017-07-02 | Discharge: 2017-07-02 | Disposition: A | Discharge status: Home / Self Care | Payer: Medicare Other | Source: Ambulatory Visit | Attending: Gastroenterology | Admitting: Gastroenterology

## 2017-07-02 DIAGNOSIS — K824 Cholesterolosis of gallbladder: Secondary | ICD-10-CM | POA: Diagnosis not present

## 2017-07-02 DIAGNOSIS — R1013 Epigastric pain: Secondary | ICD-10-CM

## 2017-07-02 DIAGNOSIS — R1011 Right upper quadrant pain: Secondary | ICD-10-CM

## 2017-07-21 DIAGNOSIS — J449 Chronic obstructive pulmonary disease, unspecified: Secondary | ICD-10-CM | POA: Diagnosis not present

## 2017-07-23 DIAGNOSIS — K219 Gastro-esophageal reflux disease without esophagitis: Secondary | ICD-10-CM | POA: Diagnosis not present

## 2017-07-23 DIAGNOSIS — Z79899 Other long term (current) drug therapy: Secondary | ICD-10-CM | POA: Diagnosis not present

## 2017-07-23 DIAGNOSIS — R109 Unspecified abdominal pain: Secondary | ICD-10-CM | POA: Diagnosis not present

## 2017-07-23 DIAGNOSIS — K660 Peritoneal adhesions (postprocedural) (postinfection): Secondary | ICD-10-CM | POA: Diagnosis not present

## 2017-07-30 ENCOUNTER — Other Ambulatory Visit: Payer: Self-pay | Admitting: Surgery

## 2017-08-02 DIAGNOSIS — J00 Acute nasopharyngitis [common cold]: Secondary | ICD-10-CM | POA: Diagnosis not present

## 2017-08-02 DIAGNOSIS — Z87891 Personal history of nicotine dependence: Secondary | ICD-10-CM | POA: Diagnosis not present

## 2017-08-02 DIAGNOSIS — J209 Acute bronchitis, unspecified: Secondary | ICD-10-CM | POA: Diagnosis not present

## 2017-08-02 DIAGNOSIS — R509 Fever, unspecified: Secondary | ICD-10-CM | POA: Diagnosis not present

## 2017-08-07 DIAGNOSIS — R509 Fever, unspecified: Secondary | ICD-10-CM | POA: Diagnosis not present

## 2017-08-07 DIAGNOSIS — J209 Acute bronchitis, unspecified: Secondary | ICD-10-CM | POA: Diagnosis not present

## 2017-08-07 DIAGNOSIS — R05 Cough: Secondary | ICD-10-CM | POA: Diagnosis not present

## 2017-08-07 DIAGNOSIS — Z87891 Personal history of nicotine dependence: Secondary | ICD-10-CM | POA: Diagnosis not present

## 2017-08-08 DIAGNOSIS — R05 Cough: Secondary | ICD-10-CM | POA: Diagnosis not present

## 2017-08-12 ENCOUNTER — Encounter: Payer: Self-pay | Admitting: Pharmacist

## 2017-08-12 ENCOUNTER — Other Ambulatory Visit: Payer: Self-pay | Admitting: Pharmacist

## 2017-08-12 NOTE — Patient Outreach (Addendum)
[facility]) Care Management  08/12/2017  [name] [birth year] [id number]   Patient was called to follow up on medication assistance and adherence with duloxetine. HIPAA identifiers were obtained. Patient was lost to follow up since 06/23/17.   Patient is a 67 year old female with multiple medical conditions including but not limited to:  COPD, anemia, depression, chronic nausea, opioid dependence, CAD, gastroparesis, osteoporosis, vitamin D deficiency, and current tobacco abuse.  Patient reported she has been taking Duloxetine 30mg  1 capsule daily and 60mg  1 capsule daily as prescribed by Dr. Dallas Breeding. Review of her chart shows both of these medications were removed from her medication list on 06/26/17.    [facility] was called. The Pharmacist reported Duloxetine 30mg  and 60mg  capsules have been filled as prescribed with the most recent fill being on 08/08/17.    Patient's medications were reviewed via telephone. Duloxetine 30mg  and 60mg  were put back on the medication list as the patient is still taking both strengths of Duloxetine to get 90mg  of duloxetine daily.  Medications Reviewed Today    Reviewed by Elayne Guerin, [facility] (Pharmacist) on 08/12/17 at [phone]  Med List Status: <None>  Medication Order Taking? Sig Documenting Provider Last Dose Status Informant  albuterol (PROVENTIL HFA;VENTOLIN HFA) 108 (90 Base) MCG/ACT inhaler [id number] Yes Inhale 2 puffs into the lungs every 4 (four) hours as needed (cough, shortness of breath or wheezing.). Shawnee Knapp, MD Taking Active Friend  Aspirin-Acetaminophen-Caffeine (GOODY HEADACHE PO) [id number] Yes Take 1 packet by mouth daily as needed (pain). [provider] Taking Active Friend  busPIRone (BUSPAR) 30 MG tablet [id number] Yes Take 1 tablet (30 mg total) by mouth 2 (two) times daily. Shawnee Knapp, MD Taking Active Friend  DULoxetine (CYMBALTA) 30 MG capsule [id number] Yes Take 30 mg by mouth daily. Bulla, Elenore Rota, PA-C  Taking Active   DULoxetine (CYMBALTA) 60 MG capsule [id number] Yes Take 60 mg by mouth daily. Egbert Garibaldi, PA-C Taking Active   HYDROcodone-acetaminophen (NORCO/VICODIN) 5-325 MG tablet [id number] Yes Take 1 tablet by mouth every 6 (six) hours as needed for moderate pain. [provider] Taking Active Friend           Med Note Elayne Guerin   Tue Aug 12, 2017  9:51 AM)    levofloxacin (LEVAQUIN) 750 MG tablet [id number] Yes Take 1 tablet by mouth daily. [provider] Taking Active   ondansetron (ZOFRAN-ODT) 8 MG disintegrating tablet [id number] Yes Take 1 tablet (8 mg total) by mouth every 8 (eight) hours as needed for nausea. Shawnee Knapp, MD Taking Active Friend  promethazine [facility]) 6.25 MG/5ML syrup [id number] Yes Take 20 mLs by mouth every 8 (eight) hours as needed for nausea. Shawnee Knapp, MD Taking Active Friend  sucralfate (CARAFATE) 1 g tablet [id number] Yes Take 1 tablet (1 g total) by mouth 4 (four) times daily -  with meals and at bedtime. Shawnee Knapp, MD Taking Active Friend  traZODone (DESYREL) 150 MG tablet [id number] Yes Take 1 tablet (150 mg total) by mouth at bedtime. Shawnee Knapp, MD Taking Active Friend  vitamin B-12 (CYANOCOBALAMIN) 1000 MCG tablet [id number] Yes Take 1,000 mcg by mouth 3 (three) times a week. [provider] Taking Active Friend           Plan: Since medication list has been corrected and the patient does not have any further pharmacy needs, the patient's case will be closed.   Elayne Guerin, PharmD, BCACP  [facility] Clinical Pharmacist [phone]

## 2017-08-18 DIAGNOSIS — G4700 Insomnia, unspecified: Secondary | ICD-10-CM | POA: Diagnosis not present

## 2017-08-18 DIAGNOSIS — R0982 Postnasal drip: Secondary | ICD-10-CM | POA: Diagnosis not present

## 2017-08-18 DIAGNOSIS — J40 Bronchitis, not specified as acute or chronic: Secondary | ICD-10-CM | POA: Diagnosis not present

## 2017-08-18 DIAGNOSIS — R110 Nausea: Secondary | ICD-10-CM | POA: Diagnosis not present

## 2017-08-20 DIAGNOSIS — Z79899 Other long term (current) drug therapy: Secondary | ICD-10-CM | POA: Diagnosis not present

## 2017-08-20 DIAGNOSIS — K660 Peritoneal adhesions (postprocedural) (postinfection): Secondary | ICD-10-CM | POA: Diagnosis not present

## 2017-08-21 DIAGNOSIS — J449 Chronic obstructive pulmonary disease, unspecified: Secondary | ICD-10-CM | POA: Diagnosis not present

## 2017-08-23 ENCOUNTER — Ambulatory Visit: Payer: Self-pay | Admitting: Family Medicine

## 2017-08-25 DIAGNOSIS — J209 Acute bronchitis, unspecified: Secondary | ICD-10-CM | POA: Diagnosis not present

## 2017-08-25 DIAGNOSIS — J449 Chronic obstructive pulmonary disease, unspecified: Secondary | ICD-10-CM | POA: Diagnosis not present

## 2017-08-25 DIAGNOSIS — G4700 Insomnia, unspecified: Secondary | ICD-10-CM | POA: Diagnosis not present

## 2017-08-26 ENCOUNTER — Telehealth: Payer: Self-pay

## 2017-08-26 NOTE — Telephone Encounter (Signed)
Please advise 

## 2017-08-26 NOTE — Telephone Encounter (Signed)
Copied from [facility]. Topic: General - Other >> Aug 22, 2017  9:24 AM Yvette Rack wrote: Reason for CRM: Mrs [name] calling wanting Dr Brigitte Pulse to write another letter stating that Mrs [place] is going blind she states that Dr Brigitte Pulse can just add this on to the last letter that she wrote for Mrs Rendi Dr Brigitte Pulse can [name] at [phone] if she has any questions

## 2017-08-27 ENCOUNTER — Ambulatory Visit (INDEPENDENT_AMBULATORY_CARE_PROVIDER_SITE_OTHER): Payer: Medicare Other | Admitting: Family Medicine

## 2017-08-27 ENCOUNTER — Other Ambulatory Visit: Payer: Self-pay

## 2017-08-27 ENCOUNTER — Encounter: Payer: Self-pay | Admitting: Family Medicine

## 2017-08-27 VITALS — BP 136/66 | HR 68 | Temp 97.7°F | Resp 18 | Ht 62.0 in | Wt 103.8 lb

## 2017-08-27 DIAGNOSIS — G933 Postviral fatigue syndrome: Secondary | ICD-10-CM

## 2017-08-27 DIAGNOSIS — E559 Vitamin D deficiency, unspecified: Secondary | ICD-10-CM | POA: Diagnosis not present

## 2017-08-27 DIAGNOSIS — G9331 Postviral fatigue syndrome: Secondary | ICD-10-CM

## 2017-08-27 DIAGNOSIS — R5383 Other fatigue: Secondary | ICD-10-CM | POA: Diagnosis not present

## 2017-08-27 DIAGNOSIS — R634 Abnormal weight loss: Secondary | ICD-10-CM | POA: Diagnosis not present

## 2017-08-27 DIAGNOSIS — Z87898 Personal history of other specified conditions: Secondary | ICD-10-CM | POA: Diagnosis not present

## 2017-08-27 DIAGNOSIS — J449 Chronic obstructive pulmonary disease, unspecified: Secondary | ICD-10-CM

## 2017-08-27 DIAGNOSIS — Z720 Tobacco use: Secondary | ICD-10-CM | POA: Diagnosis not present

## 2017-08-27 LAB — POCT CBC
Granulocyte percent: 68.4 %G (ref 37–80)
HCT, POC: 37.2 % — AB (ref 37.7–47.9)
Hemoglobin: 12.9 g/dL (ref 12.2–16.2)
LYMPH, POC: 2.4 (ref 0.6–3.4)
MCH, POC: 30.8 pg (ref 27–31.2)
MCHC: 34.8 g/dL (ref 31.8–35.4)
MCV: 88.6 fL (ref 80–97)
MID (CBC): 0.5 (ref 0–0.9)
MPV: 8.2 fL (ref 0–99.8)
POC GRANULOCYTE: 6.3 (ref 2–6.9)
POC LYMPH %: 26.6 % (ref 10–50)
POC MID %: 5 %M (ref 0–12)
Platelet Count, POC: 384 10*3/uL (ref 142–424)
RBC: 4.2 M/uL (ref 4.04–5.48)
RDW, POC: 14.8 %
WBC: 9.2 10*3/uL (ref 4.6–10.2)

## 2017-08-27 LAB — POCT URINALYSIS DIP (MANUAL ENTRY)
BILIRUBIN UA: NEGATIVE mg/dL
Bilirubin, UA: NEGATIVE
Blood, UA: NEGATIVE
Glucose, UA: NEGATIVE mg/dL
LEUKOCYTES UA: NEGATIVE
Nitrite, UA: NEGATIVE
Protein Ur, POC: NEGATIVE mg/dL
Spec Grav, UA: 1.015 (ref 1.010–1.025)
UROBILINOGEN UA: 0.2 U/dL
pH, UA: 5.5 (ref 5.0–8.0)

## 2017-08-27 LAB — POC INFLUENZA A&B (BINAX/QUICKVUE)
INFLUENZA A, POC: NEGATIVE
Influenza B, POC: NEGATIVE

## 2017-08-27 MED ORDER — METHYLPREDNISOLONE ACETATE 80 MG/ML IJ SUSP
120.0000 mg | Freq: Once | INTRAMUSCULAR | Status: AC
  Administered 2017-08-27: 120 mg via INTRAMUSCULAR

## 2017-08-27 NOTE — Progress Notes (Signed)
Shay   

## 2017-08-27 NOTE — Progress Notes (Addendum)
Subjective:  By signing my name below, I, [name], attest that this documentation has been prepared under the direction and in the presence of [name] Cheadle, MD. Electronically Signed: Moises [name], [place]. 08/27/2017 , 8:38 AM .  Patient was seen in Room 1 .   Patient ID: [name], female    DOB: June 26, 1950, 67 y.o.   MRN: [id number] Chief Complaint  Patient presents with  . Illness    x3 weeks, pt states she feels fatigued and just feels bad. Pt states she has only taken OTC aspirin. Pt reports no cough, body aches, chills, nausea, or vomiting.   HPI [name] is a 67 y.o. female who presents to Primary Care at [facility] accompanied by her roommate [name] complaining of illness that's been ongoing for about 3 weeks. She describes feeling fatigued and just "feeling bad". She states her illness "feels like the flu" but denies appetite loss, nausea, vomiting, chills, cough, shortness of breath, wheezing, chest pain, abdominal pain, lightheadedness, dizziness, or body aches. When she went for her routine pain management visit at [facility] on [facility], they noticed that she was feeling poorly and had her seen in their UC for her acute illness. Pt reports she had a neg flu swab and had chest xray done "that was okay".  She reports initially being trx'd w/ a z-pak. She returned to their office the following wk as her sxs had persisted and pt reports she was trx'd w/ a steroid injection and antibiotic injection, which gave her relief for that day but then her sxs returned over the next few days.   She's been taking her supplements, but ran out of B-12 vitamin today. She hasn't needed to use her inhaler either.  Not using any otc cough/cold meds as doesn't know what is safe to take.   She reports she has been taking Cymbalta 90mg  and Buspar for anxiety - pt not sure of any of her meds or her doses - just takes whatever [name] gives her as [name] is sig sight impaired from  macular degeneration.  [name] insists that she is following the sigs as rx'd w/o complication.   Past Medical History:  Diagnosis Date  . ACUTE DUODEN ULCER W/HEMORR PERF&OBSTRUCTION 06/26/2004   Qualifier: Diagnosis of  By: [name] MD, [name]   . Acute duodenal ulcer with hemorrhage and perforation, with obstruction ([place])   . Allergy   . Anemia   . Anginal pain ([place])    "many many years ago"  . Anxiety    sees [name] NP at Dr. Radonna Ricker office  . Arthritis    "hips" (09/06/2014)  . Asthma   . Barrett's esophagus   . Chronic abdominal pain    narcotic dependence, dr gyarteng-dak at [facility]  . 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 Complication of anesthesia    pt states had too much xanax on board - agitated   . COPD (chronic obstructive pulmonary disease) ([place])   . Depression   . Exertional shortness of breath   . Fall 11/23/2014   frequent falls  . FEVER, RECURRENT 08/11/2009   Qualifier: Diagnosis of  By: [name] MD, [name]   . Fx of fibula 07-28-11   left fibula, 2 places   . Fx two ribs-open 08-24-11   left 4th and 5th  . GASTRIC OUTLET OBSTRUCTION 08/28/2007   In July 2008 she underwent laparoscopic enterolysis, Nissen fundoplication over a #99 bougie,  single pledgeted suture colosure of the hiatus and a 3 suture wrap.  Lap truncal vagotomy and a loop gastrojejunostomy was performed.  This was revised in August of 2009 to a roux en Y gastrojejujostomy.     . Gastroparesis 06/03/2007   Qualifier: Diagnosis of  By: [name] MD, [name]   . GERD (gastroesophageal reflux disease)   . Headache    "monthly" (09/06/2014)  . History of [name] transfusion    "related to OR; maybe when I perforated that ulcer"  . History of hiatal hernia   . Lap Nissen + truncal vagotomy July 2008 05/13/2013  . LEG EDEMA 01/19/2008   Qualifier: Diagnosis of  By: [name] MD, [name]   . Macular degeneration, bilateral    early onset, severe, legally blind and  worsening. Followed by dr. Katy Fitch  . Menopause   . MENOPAUSE 01/07/2007   Qualifier: Diagnosis of  By: [name], CMA, [place]    . Myocardial infarction ([place])   . Narcotic abuse ([place])   . Osteoporosis    severe  . Oxygen deficiency   . Peptic ulcer disease    dr Oletta Lamas  . Pneumonia, organism unspecified(486) 11/07/2010   Assoc with R Parapneumonic effusion 09/2010    - Tapped 10/05/10    - CxR resolved 11/07/2010    . S/P jejunostomy Feb 2016 07/26/2014   Past Surgical History:  Procedure Laterality Date  . BIOPSY THYROID  08-13-11   benign nodule, per Dr. Melida Quitter   . CATARACT EXTRACTION W/ INTRAOCULAR LENS  IMPLANT, BILATERAL Bilateral   . COLONOSCOPY    . DILATION AND CURETTAGE OF UTERUS    . ESOPHAGOGASTRODUODENOSCOPY  12-29-09   dr qadeer at [facility], several gastric ulcers  . ESOPHAGOGASTRODUODENOSCOPY N/A 09/25/2012   Procedure: ESOPHAGOGASTRODUODENOSCOPY (EGD);  Surgeon: Beryle Beams, MD;  Location: [facility];  Service: Endoscopy;  Laterality: N/A;  . ESOPHAGOGASTRODUODENOSCOPY (EGD) WITH PROPOFOL N/A 06/27/2017   Procedure: ESOPHAGOGASTRODUODENOSCOPY (EGD) WITH PROPOFOL;  Surgeon: Carol Ada, MD;  Location: [facility];  Service: Endoscopy;  Laterality: N/A;  . FRACTURE SURGERY    . GASTROJEJUNOSTOMY  02-20-08   Roux en Y gastrojejunsotomy w 1 foot Roux limb  . GASTROSTOMY N/A 07/26/2014   Procedure: LAPRASCOPIC ASSISTED OPEN PLACEMENT OF JEJUNOSTOMY TUBE;  Surgeon: Pedro Earls, MD;  Location: [facility];  Service: General;  Laterality: N/A;  . HERNIA REPAIR     "hiatal"  . JOINT REPLACEMENT    . LAPAROSCOPIC NISSEN FUNDOPLICATION    . LAPAROSCOPIC PARTIAL GASTRECTOMY N/A 06/29/2013   Procedure: Gastrectomy and GJ Tube placement;  Surgeon: Pedro Earls, MD;  Location: [facility];  Service: General;  Laterality: N/A;  . ORIF HIP FRACTURE Bilateral    "pins in both of them"  . REPAIR OF PERFORATED ULCER    . TONSILLECTOMY     Prior to Admission medications   Medication Sig  Start Date End Date Taking? Authorizing Provider  albuterol (PROVENTIL HFA;VENTOLIN HFA) 108 (90 Base) MCG/ACT inhaler Inhale 2 puffs into the lungs every 4 (four) hours as needed (cough, shortness of breath or wheezing.). 08/01/15  Yes Shawnee Knapp, MD  Aspirin-Acetaminophen-Caffeine (GOODY HEADACHE PO) Take 1 packet by mouth daily as needed (pain).   Yes [provider]  busPIRone (BUSPAR) 30 MG tablet Take 1 tablet (30 mg total) by mouth 2 (two) times daily. 05/17/17  Yes Shawnee Knapp, MD  DULoxetine (CYMBALTA) 30 MG capsule Take 30 mg by mouth daily.   Yes Egbert Garibaldi,  PA-C  DULoxetine (CYMBALTA) 60 MG capsule Take 60 mg by mouth daily.   Yes Bulla, Elenore Rota, PA-C  HYDROcodone-acetaminophen (NORCO/VICODIN) 5-325 MG tablet Take 1 tablet by mouth every 6 (six) hours as needed for moderate pain.   Yes [provider]  levofloxacin (LEVAQUIN) 750 MG tablet Take 1 tablet by mouth daily. 08/10/17  Yes [provider]  ondansetron (ZOFRAN-ODT) 8 MG disintegrating tablet Take 1 tablet (8 mg total) by mouth every 8 (eight) hours as needed for nausea. 05/17/17  Yes Shawnee Knapp, MD  promethazine (PHENERGAN) 6.25 MG/5ML syrup Take 20 mLs by mouth every 8 (eight) hours as needed for nausea. 05/27/17  Yes Shawnee Knapp, MD  sucralfate (CARAFATE) 1 g tablet Take 1 tablet (1 g total) by mouth 4 (four) times daily -  with meals and at bedtime. 05/31/17  Yes Shawnee Knapp, MD  traZODone (DESYREL) 150 MG tablet Take 1 tablet (150 mg total) by mouth at bedtime. 05/31/17  Yes Shawnee Knapp, MD  vitamin B-12 (CYANOCOBALAMIN) 1000 MCG tablet Take 1,000 mcg by mouth 3 (three) times a week.   Yes [provider]   Allergies  Allergen Reactions  . Lithium Nausea And Vomiting  . Celebrex [Celecoxib] Other (See Comments)    History of bleeding ulcers  . Morphine Itching  . Quetiapine Other (See Comments)     tardive dyskinesia  . Varenicline Tartrate Other (See Comments)    hallucinations    Family History  Problem Relation Age of Onset  . Cancer Mother        kidney, magliant tumor on face  . Stroke Mother   . Celiac disease Father   . Cancer Father        kidney and pancreatic   Social History   Socioeconomic History  . Marital status: Divorced    Spouse name: None  . Number of children: None  . Years of education: None  . Highest education level: None  Social Needs  . Financial resource strain: None  . Food insecurity - worry: None  . Food insecurity - inability: None  . Transportation needs - medical: None  . Transportation needs - non-medical: None  Occupational History  . None  Tobacco Use  . Smoking status: Current Every Day Smoker    Packs/day: 2.00    Years: 50.00    Pack years: 100.00    Types: Cigarettes  . Smokeless tobacco: Never Used  Substance and Sexual Activity  . Alcohol use: No    Alcohol/week: 0.0 oz  . Drug use: No  . Sexual activity: No  Other Topics Concern  . None  Social History Narrative  . None   Depression screen [facility] 2/9 08/27/2017 05/31/2017 05/26/2017 05/22/2017 05/22/2017  Decreased Interest 0 0 3 - 3  Down, Depressed, Hopeless 0 0 1 2 2   PHQ - 2 Score 0 0 4 2 5   Altered sleeping - - 2 - 3  Tired, decreased energy - - 2 - 2  Change in appetite - - 3 - 3  Feeling bad or failure about yourself  - - 3 - 3  Trouble concentrating - - 0 - 0  Moving slowly or fidgety/restless - - 3 - 3  Suicidal thoughts - - 0 - 0  PHQ-9 Score - - 17 - 19  Difficult doing work/chores - - Extremely dIfficult - Extremely dIfficult  Some recent data might be hidden    Review of Systems  Constitutional: Positive for fatigue.  Negative for appetite change, chills, fever and unexpected weight change.  Respiratory: Negative for cough, shortness of breath and wheezing.   Cardiovascular: Negative for chest pain and palpitations.  Gastrointestinal: Negative for constipation, diarrhea, nausea and vomiting.  Musculoskeletal: Negative for myalgias.   Skin: Negative for rash and wound.  Neurological: Negative for dizziness, weakness and headaches.       Objective:   Physical Exam  Constitutional: She is oriented to person, place, and time. She appears well-developed and well-nourished. No distress.  HENT:  Head: Normocephalic and atraumatic.  Right Ear: Tympanic membrane is injected.  Left Ear: Tympanic membrane is injected.  Nose: Rhinorrhea (purulent rhinitis) present.  Mouth/Throat: Oropharynx is clear and moist.  Eyes: EOM are normal. Pupils are equal, round, and reactive to light.  Neck: Neck supple. No thyromegaly present.  Cardiovascular: Normal rate, regular rhythm and normal heart sounds.  No murmur heard. Pulmonary/Chest: Effort normal and breath sounds normal. No respiratory distress. She has no wheezes.  Musculoskeletal: Normal range of motion.  Lymphadenopathy:    She has no cervical adenopathy.  Neurological: She is alert and oriented to person, place, and time.  Skin: Skin is warm and dry.  Psychiatric: She has a normal mood and affect. Her behavior is normal.  Nursing note and vitals reviewed.   BP 136/66 (BP Location: Left Arm, Patient Position: Sitting, Cuff Size: Normal)   Pulse 68   Temp 97.7 F (36.5 C) (Oral)   Resp 18   Ht 5\' 2"  (1.575 m)   Wt 103 lb 12.8 oz (47.1 kg)   SpO2 97%   BMI 18.99 kg/m    Results for orders placed or performed in visit on 08/27/17  POCT CBC  Result Value Ref Range   WBC 9.2 4.6 - 10.2 K/uL   Lymph, poc 2.4 0.6 - 3.4   POC LYMPH PERCENT 26.6 10 - 50 %L   MID (cbc) 0.5 0 - 0.9   POC MID % 5.0 0 - 12 %M   POC Granulocyte 6.3 2 - 6.9   Granulocyte percent 68.4 37 - 80 %G   RBC 4.20 4.04 - 5.48 M/uL   Hemoglobin 12.9 12.2 - 16.2 g/dL   HCT, POC 37.2 (A) 37.7 - 47.9 %   MCV 88.6 80 - 97 fL   MCH, POC 30.8 27 - 31.2 pg   MCHC 34.8 31.8 - 35.4 g/dL   RDW, POC 14.8 %   Platelet Count, POC 384 142 - 424 K/uL   MPV 8.2 0 - 99.8 fL  POCT urinalysis dipstick  Result  Value Ref Range   Color, UA yellow yellow   Clarity, UA clear clear   Glucose, UA negative negative mg/dL   Bilirubin, UA negative negative   Ketones, POC UA negative negative mg/dL   Spec Grav, UA 1.015 1.010 - 1.025   [name], UA negative negative   pH, UA 5.5 5.0 - 8.0   Protein Ur, POC negative negative mg/dL   Urobilinogen, UA 0.2 0.2 or 1.0 E.U./dL   Nitrite, UA Negative Negative   Leukocytes, UA Negative Negative  POC Influenza A&B(BINAX/QUICKVUE)  Result Value Ref Range   Influenza A, POC Negative Negative   Influenza B, POC Negative Negative       Assessment & Plan:   1. Postviral fatigue syndrome - pt really doesn't have any specific physical complaints beyond a generalized feeling of "not well" and acute illness, fatigue and her exam is at pt's baseline. Considering the extent of pt's  poor baseline health, polypharmacy, and numerous comorbid medical conditions, I reassured her that it will likely take her several weeks to fully recover from her recent illness. It certainly sounds as if she has been adequately covered for complications w/ recent zpack followed by both a steroid and an antibiotic injection.  Pt did feel better for a few days after the steroid inj so will try that again but with what I suspect is a higher dose. Work on rest, fluids, high quality diet.  2. Fatigue, unspecified type - check labs to ensure not overlooking other causes of fatigue other than recent viral illness  3. High magnesium levels   4. Vitamin D deficiency   5. History of prediabetes   6. Chronic obstructive pulmonary disease, unspecified COPD type ([place])   7. Tobacco abuse - referred for lung cancer screening CT. Pt w/ very long-standing desire to quit but not really making attempts beyond half-hearted periodic attempts to cut down by a few cigs/d.  8. Weight loss - underweight but is not loosing at all - stable for years and reports eating very well. Pt has been seeing Dr. Benson Norway for recheck again  recently - will request records.   Request records from pt's pain mngmnt provider Dr. Dallas Breeding at [facility] to ensure we are coordinating care effectively and safely.  Reminded to sched mammogram  Orders Placed This Encounter  Procedures  . Respiratory virus panel  . Comprehensive metabolic panel  . Magnesium  . Vitamin B12  . VITAMIN D 25 Hydroxy (Vit-D Deficiency, Fractures)  . Hemoglobin A1c  . Hemoglobin A1c  . Ambulatory Referral for Lung Cancer Scre    Referral Priority:   Routine    Referral Type:   Consultation    Referral Reason:   Specialty Services Required    Number of Visits Requested:   1  . Care order/instruction:    Scheduling Instructions:     [facility] (IF NEB IS ORDERED PLEASE DO BEFORE AND AFTER NEB)  . POCT CBC  . POCT urinalysis dipstick  . POC Influenza A&B(BINAX/QUICKVUE)    Meds ordered this encounter  Medications  . methylPREDNISolone acetate (DEPO-MEDROL) injection 120 mg    I personally performed the services described in this documentation, which was scribed in my presence. The recorded information has been reviewed and considered, and addended by me as needed.   [name], M.D.  Primary Care at [facility] [address] [place], [place] [address] [phone] phone [phone] fax  08/30/17 8:02 AM

## 2017-08-27 NOTE — Patient Instructions (Addendum)
[facility] is now offering annual lung cancer screening by low-dose CT scan.  This is covered for qualifying patients and your insurance will be checked before the procedure.  Call the lung cancer screening nurse navigators at [phone] to learn more about this and get scheduled.  If you don't want to call, you will get a phone call from [facility] pulmonology within the next few weeks to schedule an appointment with Eric Form.  Please stop by the front desk to sign a release so I can get records from GI Dr. Benson Norway and pain medicine Dr. Dallas Breeding at [place].  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 This can take 1-2 months to fully resolve. You are doing all of the right things - rest, sleep, plenty of fluids, keeping up nutrition and protein levels, plenty of fresh fruits and vegetables. You should get a little better day by day. If you are feeling worse at all, please let me know or don't hesitate to come back in. Hopefully the Depo-Medrol steroid shot you were given today will help your energy levels and appetite but it does decrease your immune system to put you at risk for picking up other infections so don't touch your face without first washing your hands and stay out of public places where you could be exposed to an illness.  We recommend that you schedule a mammogram for breast cancer screening. Typically, you do not need a referral to do this. Please contact a local imaging center to schedule your mammogram.  [facility] - [phone]  *ask for the Radiology Department [facility] ([place]) - [phone] or [phone]  [facility] - [phone] [place] [phone] [facility] - [phone]  *ask for the [facility] - [phone]  *ask for the Radiology Department [facility] - [phone]   *ask for the [facility] - [phone]   IF you received an x-ray today, you will receive an invoice from [facility] Rad[facility]. Please contact [facility] Radiology at [phone] with questions or concerns regarding your invoice.   IF you received labwork today, you will receive an invoice from [facility]. Please contact [facility] at [phone] with questions or concerns regarding your invoice.   Our billing staff will not be able to assist you with questions regarding bills from these companies.  You will be contacted with the lab results as soon as they are available. The fastest way to get your results is to activate your My Chart account. Instructions are located on the last page of this paperwork. If you have not heard from [place] regarding the results in 2 weeks, please contact this office.    Viral Respiratory Infection A respiratory infection is an illness that affects part of the respiratory system, such as the lungs, nose, or throat. Most respiratory infections are caused by either viruses or bacteria. A respiratory infection that is caused by a virus is called a viral respiratory infection. Common types of viral respiratory infections include:  A cold.  The flu (influenza).  A respiratory syncytial virus (RSV) infection.  How do I know if I have a viral respiratory infection? Most viral respiratory infections cause:  A stuffy or runny nose.  Yellow or green nasal discharge.  A cough.  Sneezing.  Fatigue.  Achy muscles.  A sore throat.  Sweating or chills.  A  fever.  A headache.  How are viral respiratory infections treated? If influenza is diagnosed early, it may be treated with an antiviral medicine that shortens the length of time a person has symptoms. Symptoms of viral respiratory infections may be treated with over-the-counter and prescription medicines, such as:  Expectorants. These make it easier to cough up  mucus.  Decongestant nasal sprays.  Health care providers do not prescribe antibiotic medicines for viral infections. This is because antibiotics are designed to kill bacteria. They have no effect on viruses. How do I know if I should stay home from work or school? To avoid exposing others to your respiratory infection, stay home if you have:  A fever.  A persistent cough.  A sore throat.  A runny nose.  Sneezing.  Muscles aches.  Headaches.  Fatigue.  Weakness.  Chills.  Sweating.  Nausea.  Follow these instructions at home:  Rest as much as possible.  Take over-the-counter and prescription medicines only as told by your health care provider.  Drink enough fluid to keep your urine clear or pale yellow. This helps prevent dehydration and helps loosen up mucus.  Gargle with a salt-water mixture 3-4 times per day or as needed. To make a salt-water mixture, completely dissolve -1 tsp of salt in 1 cup of warm water.  Use nose drops made from salt water to ease congestion and soften raw skin around your nose.  Do not drink alcohol.  Do not use tobacco products, including cigarettes, chewing tobacco, and e-cigarettes. If you need help quitting, ask your health care provider. Contact a health care provider if:  Your symptoms last for 10 days or longer.  Your symptoms get worse over time.  You have a fever.  You have severe sinus pain in your face or forehead.  The glands in your jaw or neck become very swollen. Get help right away if:  You feel pain or pressure in your chest.  You have shortness of breath.  You faint or feel like you will faint.  You have severe and persistent vomiting.  You feel confused or disoriented. This information is not intended to replace advice given to you by your health care provider. Make sure you discuss any questions you have with your health care provider. Document Released: 03/20/2005 Document Revised: 11/16/2015  Document Reviewed: 11/16/2014 Elsevier Interactive Patient Education  2018 [place].    High-Protein and High-Calorie Diet Eating high-protein and high-calorie foods can help you to gain weight, heal after an injury, and recover after an illness or surgery. What is my plan? The specific amount of daily protein and calories you need depends on:  Your body weight.  The reason this diet is recommended for you.  Generally, a high-protein, high-calorie diet involves:  Eating 250-500 extra calories each day.  Making sure that 10-35% of your daily calories come from protein.  Talk to your health care provider about how much protein and how many calories you need each day. Follow the diet as directed by your health care provider. What do I need to know about this diet?  Ask your health care provider if you should take a nutritional supplement.  Try to eat six small meals each day instead of three large meals.  Eat a balanced diet, including one food that is high in protein at each meal.  Keep nutritious snacks handy, such as nuts, trail mixes, dried fruit, and yogurt.  If you have kidney disease or diabetes, eating too much protein  may put extra stress on your kidneys. Talk to your health care provider if you have either of those conditions. What are some high-protein foods? Grains Quinoa. Bulgur wheat. Vegetables Soybeans. Peas. Meats and Other Protein Sources Beef, pork, and poultry. Fish and seafood. Eggs. Tofu. Textured vegetable protein (TVP). Peanut butter. Nuts and seeds. Dried beans. Protein powders. Dairy Whole milk. Whole-milk yogurt. Powdered milk. Cheese. [employer]. Eggnog. Beverages High-protein supplement drinks. Soy milk. Other Protein bars. The items listed above may not be a complete list of recommended foods or beverages. Contact your dietitian for more options. What are some high-calorie foods? Grains Pasta. Quick breads. Muffins. Pancakes.  Ready-to-eat cereal. Vegetables Vegetables cooked in oil or butter. Fried potatoes. Fruits Dried fruit. Fruit leather. Canned fruit in syrup. Fruit juice. Avocados. Meats and Other Protein Sources Peanut butter. Nuts and seeds. Dairy Heavy cream. Whipped cream. Cream cheese. Sour cream. Ice cream. Custard. Pudding. Beverages Meal-replacement beverages. Nutrition shakes. Fruit juice. Sugar-sweetened soft drinks. Condiments Salad dressing. Mayonnaise. Alfredo sauce. Fruit preserves or jelly. Honey. Syrup. Sweets/Desserts Cake. Cookies. Pie. Pastries. Candy bars. Chocolate. Fats and Oils Butter or margarine. Oil. Gravy. Other Meal-replacement bars. The items listed above may not be a complete list of recommended foods or beverages. Contact your dietitian for more options. What are some tips for including high-protein and high-calorie foods in my diet?  Add whole milk, half-and-half, or heavy cream to cereal, pudding, soup, or hot cocoa.  Add whole milk to instant breakfast drinks.  Add peanut butter to oatmeal or smoothies.  Add powdered milk to baked goods, smoothies, or milkshakes.  Add powdered milk, cream, or butter to mashed potatoes.  Add cheese to cooked vegetables.  Make whole-milk yogurt parfaits. Top them with granola, fruit, or nuts.  Add cottage cheese to your fruit.  Add avocados, cheese, or both to sandwiches or salads.  Add meat, poultry, or seafood to rice, pasta, casseroles, salads, and soups.  Use mayonnaise when making egg salad, chicken salad, or tuna salad.  Use peanut butter as a topping for pretzels, celery, or crackers.  Add beans to casseroles, dips, and spreads.  Add pureed beans to sauces and soups.  Replace calorie-free drinks with calorie-containing drinks, such as milk and fruit juice. This information is not intended to replace advice given to you by your health care provider. Make sure you discuss any questions you have with your health  care provider. Document Released: 06/10/2005 Document Revised: 11/16/2015 Document Reviewed: 11/23/2013 Elsevier Interactive Patient Education  2018 [place] Content in Foods Generally, most healthy people need around 50 grams of protein each day. Depending on your overall health, you may need more or less protein in your diet. Talk to your health care provider or dietitian about how much protein you need. See the following list for the protein content of some common foods. High-protein foods High-protein foods contain 4 grams (4 g) or more of protein per serving. They include:  Beef, ground sirloin (cooked) - 3 oz have 24 g of protein.  Cheese (hard) - 1 oz has 7 g of protein.  Chicken breast, boneless and skinless (cooked) - 3 oz have 13.4 g of protein.  Cottage cheese - 1/2 cup has 13.4 g of protein.  Egg - 1 egg has 6 g of protein.  Fish, filet (cooked) - 1 oz has 6-7 g of protein.  Garbanzo beans (canned or cooked) - 1/2 cup has 6-7 g of protein.  Kidney beans (canned or cooked) -  1/2 cup has 6-7 g of protein.  Lamb (cooked) - 3 oz has 24 g of protein.  Milk - 1 cup (8 oz) has 8 g of protein.  Nuts (peanuts, pistachios, almonds) - 1 oz has 6 g of protein.  Peanut butter - 1 oz has 7-8 g of protein.  Pork tenderloin (cooked) - 3 oz has 18.4 g of protein.  Pumpkin seeds - 1 oz has 8.5 g of protein.  Soybeans (roasted) - 1 oz has 8 g of protein.  Soybeans (cooked) - 1/2 cup has 11 g of protein.  Soy milk - 1 cup (8 oz) has 5-10 g of protein.  Soy or vegetable patty - 1 patty has 11 g of protein.  Sunflower seeds - 1 oz has 5.5 g of protein.  Tofu (firm) - 1/2 cup has 20 g of protein.  Tuna (canned in water) - 3 oz has 20 g of protein.  Yogurt - 6 oz has 8 g of protein.  Low-protein foods Low-protein foods contain 3 grams (3 g) or less of protein per serving. They include:  Beets (raw or cooked) - 1/2 cup has 1.5 g of protein.  Bran cereal -  1/2 cup has 2-3 g of protein.  Bread - 1 slice has 2.5 g of protein.  Broccoli (raw or cooked) - 1/2 cup has 2 g of protein.  Collard greens (raw or cooked) - 1/2 cup has 2 g of protein.  Corn (fresh or cooked) - 1/2 cup has 2 g of protein.  Cream cheese - 1 oz has 2 g of protein.  Creamer (half-and-half) - 1 oz has 1 g of protein.  Flour tortilla - 1 tortilla has 2.5 g of protein  Frozen yogurt - 1/2 cup has 3 g of protein.  Fruit or vegetable juice - 1/2 cup has 1 g of protein.  Green beans (raw or cooked) - 1/2 cup has 1 g of protein.  Green peas (canned) - 1/2 cup has 3.5 g of protein.  Muffins - 1 small muffin (2 oz) has 3 g of protein.  Oatmeal (cooked) - 1/2 cup has 3 g of protein.  Potato (baked with skin) - 1 medium potato has 3 g of protein.  Rice (cooked) - 1/2 cup has 2.5-3.5 g of protein.  Sour cream - 1/2 cup has 2.5 g of protein.  Spinach (cooked) - 1/2 cup has 3 g of protein.  Squash (cooked) - 1/2 cup has 1.5 g of protein.  Actual amounts of protein may be different depending on processing. Talk with your health care provider or dietitian about what foods are recommended for you. This information is not intended to replace advice given to you by your health care provider. Make sure you discuss any questions you have with your health care provider. Document Released: 09/09/2015 Document Revised: 02/19/2016 Document Reviewed: 02/19/2016 Elsevier Interactive Patient Education  [employer].

## 2017-08-28 ENCOUNTER — Other Ambulatory Visit: Payer: Self-pay | Admitting: Acute Care

## 2017-08-28 DIAGNOSIS — Z122 Encounter for screening for malignant neoplasm of respiratory organs: Secondary | ICD-10-CM

## 2017-08-28 DIAGNOSIS — F17210 Nicotine dependence, cigarettes, uncomplicated: Principal | ICD-10-CM

## 2017-08-28 LAB — COMPREHENSIVE METABOLIC PANEL
ALT: 8 IU/L (ref 0–32)
AST: 14 IU/L (ref 0–40)
Albumin/Globulin Ratio: 2 (ref 1.2–2.2)
Albumin: 4.3 g/dL (ref 3.6–4.8)
Alkaline Phosphatase: 92 IU/L (ref 39–117)
BUN/Creatinine Ratio: 12 (ref 12–28)
BUN: 10 mg/dL (ref 8–27)
Bilirubin Total: 0.2 mg/dL (ref 0.0–1.2)
CALCIUM: 9.1 mg/dL (ref 8.7–10.3)
CO2: 25 mmol/L (ref 20–29)
Chloride: 101 mmol/L (ref 96–106)
Creatinine, Ser: 0.83 mg/dL (ref 0.57–1.00)
GFR, EST AFRICAN AMERICAN: 86 mL/min/{1.73_m2} (ref >59)
GFR, EST NON AFRICAN AMERICAN: 74 mL/min/{1.73_m2} (ref >59)
GLUCOSE: 90 mg/dL (ref 65–99)
Globulin, Total: 2.1 g/dL (ref 1.5–4.5)
Potassium: 4 mmol/L (ref 3.5–5.2)
Sodium: 143 mmol/L (ref 134–144)
TOTAL PROTEIN: 6.4 g/dL (ref 6.0–8.5)

## 2017-08-28 LAB — MAGNESIUM: Magnesium: 2.3 mg/dL (ref 1.6–2.3)

## 2017-08-28 LAB — HEMOGLOBIN A1C
ESTIMATED AVERAGE GLUCOSE: 103 mg/dL
HEMOGLOBIN A1C: 5.2 % (ref 4.8–5.6)

## 2017-08-28 LAB — VITAMIN B12: Vitamin B-12: 521 pg/mL (ref 232–1245)

## 2017-08-28 LAB — VITAMIN D 25 HYDROXY (VIT D DEFICIENCY, FRACTURES): VIT D 25 HYDROXY: 16.5 ng/mL — AB (ref 30.0–100.0)

## 2017-08-28 NOTE — Telephone Encounter (Signed)
done

## 2017-08-30 ENCOUNTER — Ambulatory Visit (INDEPENDENT_AMBULATORY_CARE_PROVIDER_SITE_OTHER): Payer: Medicare Other | Admitting: Family Medicine

## 2017-08-30 ENCOUNTER — Ambulatory Visit: Payer: Self-pay | Admitting: Family Medicine

## 2017-08-30 ENCOUNTER — Encounter: Payer: Self-pay | Admitting: Family Medicine

## 2017-08-30 ENCOUNTER — Other Ambulatory Visit: Payer: Self-pay

## 2017-08-30 VITALS — BP 138/82 | HR 82 | Temp 98.6°F | Resp 16 | Ht 64.37 in | Wt 102.0 lb

## 2017-08-30 DIAGNOSIS — M818 Other osteoporosis without current pathological fracture: Secondary | ICD-10-CM

## 2017-08-30 DIAGNOSIS — Z1231 Encounter for screening mammogram for malignant neoplasm of breast: Secondary | ICD-10-CM

## 2017-08-30 DIAGNOSIS — E2839 Other primary ovarian failure: Secondary | ICD-10-CM | POA: Diagnosis not present

## 2017-08-30 DIAGNOSIS — E559 Vitamin D deficiency, unspecified: Secondary | ICD-10-CM

## 2017-08-30 DIAGNOSIS — G933 Postviral fatigue syndrome: Secondary | ICD-10-CM | POA: Diagnosis not present

## 2017-08-30 DIAGNOSIS — R634 Abnormal weight loss: Secondary | ICD-10-CM

## 2017-08-30 DIAGNOSIS — G9331 Postviral fatigue syndrome: Secondary | ICD-10-CM

## 2017-08-30 DIAGNOSIS — Z1239 Encounter for other screening for malignant neoplasm of breast: Secondary | ICD-10-CM

## 2017-08-30 DIAGNOSIS — F17200 Nicotine dependence, unspecified, uncomplicated: Secondary | ICD-10-CM | POA: Diagnosis not present

## 2017-08-30 MED ORDER — VITAMIN D (ERGOCALCIFEROL) 1.25 MG (50000 UNIT) PO CAPS: 50000.0000 [IU] | ORAL_CAPSULE | ORAL | 1 refills | Status: DC

## 2017-08-30 MED ORDER — METHYLPREDNISOLONE ACETATE 80 MG/ML IJ SUSP
120.0000 mg | Freq: Once | INTRAMUSCULAR | Status: AC
  Administered 2017-08-30: 120 mg via INTRAMUSCULAR

## 2017-08-30 NOTE — Patient Instructions (Addendum)
You need to schedule your mammogram and your DEXA bone scan.  Please call [facility] at [phone] to schedule.     IF you received an x-ray today, you will receive an invoice from [facility] Radiology. Please contact [facility] Radiology at [phone] with questions or concerns regarding your invoice.   IF you received labwork today, you will receive an invoice from [facility]. Please contact [facility] at [phone] with questions or concerns regarding your invoice.   Our billing staff will not be able to assist you with questions regarding bills from these companies.  You will be contacted with the lab results as soon as they are available. The fastest way to get your results is to activate your My Chart account. Instructions are located on the last page of this paperwork. If you have not heard from [place] regarding the results in 2 weeks, please contact this office.      Vitamin D Deficiency Vitamin D deficiency is when your body does not have enough vitamin D. Vitamin D is important to your body for many reasons:  It helps the body to absorb two important minerals, called calcium and phosphorus.  It plays a role in bone health.  It may help to prevent some diseases, such as diabetes and multiple sclerosis.  It plays a role in muscle function, including heart function.  You can get vitamin D by:  Eating foods that naturally contain vitamin D.  Eating or drinking milk or other dairy products that have vitamin D added to them.  Taking a vitamin D supplement or a multivitamin supplement that contains vitamin D.  Being in the sun. Your body naturally makes vitamin D when your skin is exposed to sunlight. Your body changes the sunlight into a form of the vitamin that the body can use.  If vitamin D deficiency is severe, it can cause a condition in which your bones become soft. In adults, this condition is called osteomalacia. In children, this condition  is called rickets. What are the causes? Vitamin D deficiency may be caused by:  Not eating enough foods that contain vitamin D.  Not getting enough sun exposure.  Having certain digestive system diseases that make it difficult for your body to absorb vitamin D. These diseases include Crohn disease, chronic pancreatitis, and cystic fibrosis.  Having a surgery in which a part of the stomach or a part of the small intestine is removed.  Being obese.  Having chronic kidney disease or liver disease.  What increases the risk? This condition is more likely to develop in:  Older people.  People who do not spend much time outdoors.  People who live in a long-term care facility.  People who have had broken bones.  People with weak or thin bones (osteoporosis).  People who have a disease or condition that changes how the body absorbs vitamin D.  People who have dark skin.  People who take certain medicines, such as steroid medicines or certain seizure medicines.  People who are overweight or obese.  What are the signs or symptoms? In mild cases of vitamin D deficiency, there may not be any symptoms. If the condition is severe, symptoms may include:  Bone pain.  Muscle pain.  Falling often.  Broken bones caused by a minor injury.  How is this diagnosed? This condition is usually diagnosed with a blood test. How is this treated? Treatment for this condition may depend on what caused the condition. Treatment options include:  Taking  vitamin D supplements.  Taking a calcium supplement. Your health care provider will suggest what dose is best for you.  Follow these instructions at home:  Take medicines and supplements only as told by your health care provider.  Eat foods that contain vitamin D. Choices include: ? Fortified dairy products, cereals, or juices. Fortified means that vitamin D has been added to the food. Check the label on the package to be sure. ? Fatty  fish, such as salmon or trout. ? Eggs. ? Oysters.  Do not use a tanning bed.  Maintain a healthy weight. Lose weight, if needed.  Keep all follow-up visits as told by your health care provider. This is important. Contact a health care provider if:  Your symptoms do not go away.  You feel like throwing up (nausea) or you throw up (vomit).  You have fewer bowel movements than usual or it is difficult for you to have a bowel movement (constipation). This information is not intended to replace advice given to you by your health care provider. Make sure you discuss any questions you have with your health care provider. Document Released: 09/02/2011 Document Revised: 11/22/2015 Document Reviewed: 10/26/2014 Elsevier Interactive Patient Education  2018 [place] need to schedule your mammogram and your DEXA bone scan.  Please call [facility] at [phone] to schedule.     Osteoporosis Osteoporosis is the thinning and loss of density in the bones. Osteoporosis makes the bones more brittle, fragile, and likely to break (fracture). Over time, osteoporosis can cause the bones to become so weak that they fracture after a simple fall. The bones most likely to fracture are the bones in the hip, wrist, and spine. What are the causes? The exact cause is not known. What increases the risk? Anyone can develop osteoporosis. You may be at greater risk if you have a family history of the condition or have poor nutrition. You may also have a higher risk if you are:  Female.  61 years old or older.  A smoker.  Not physically active.  White or Asian.  Slender.  What are the signs or symptoms? A fracture might be the first sign of the disease, especially if it results from a fall or injury that would not usually cause a bone to break. Other signs and symptoms include:  Low back and neck pain.  Stooped posture.  Height loss.  How is this  diagnosed? To make a diagnosis, your health care provider may:  Take a medical history.  Perform a physical exam.  Order tests, such as: ? A bone mineral density test. ? A dual-energy X-ray absorptiometry test.  How is this treated? The goal of osteoporosis treatment is to strengthen your bones to reduce your risk of a fracture. Treatment may involve:  Making lifestyle changes, such as: ? Eating a diet rich in calcium. ? Doing weight-bearing and muscle-strengthening exercises. ? Stopping tobacco use. ? Limiting alcohol intake.  Taking medicine to slow the process of bone loss or to increase bone density.  Monitoring your levels of calcium and vitamin D.  Follow these instructions at home:  Include calcium and vitamin D in your diet. Calcium is important for bone health, and vitamin D helps the body absorb calcium.  Perform weight-bearing and muscle-strengthening exercises as directed by your health care provider.  Do not use any tobacco products, including cigarettes, chewing tobacco, and electronic cigarettes. If you need help quitting, ask your  health care provider.  Limit your alcohol intake.  Take medicines only as directed by your health care provider.  Keep all follow-up visits as directed by your health care provider. This is important.  Take precautions at home to lower your risk of falling, such as: ? Keeping rooms well lit and clutter free. ? Installing safety rails on stairs. ? Using rubber mats in the bathroom and other areas that are often wet or slippery. Get help right away if: You fall or injure yourself. This information is not intended to replace advice given to you by your health care provider. Make sure you discuss any questions you have with your health care provider. Document Released: 03/20/2005 Document Revised: 11/13/2015 Document Reviewed: 11/18/2013 Elsevier Interactive Patient Education  [employer].

## 2017-08-30 NOTE — Progress Notes (Signed)
Subjective:  By signing my name below, I, [name], attest that this documentation has been prepared under the direction and in the presence of [name], MD. Electronically Signed: [name], [place]. 08/30/2017 , 10:36 AM .  Patient was seen in Room 2 .   Patient ID: [name], female    DOB: Feb 18, 1951, 67 y.o.   MRN: [id number] Chief Complaint  Patient presents with  . Fatigue    pt states she has been having bodyaches and feeling week x 4 weeks    HPI [name] is a 67 y.o. female who presents to Primary Care at [facility] complaining of fatigue with body aches ongoing for about 4 weeks now. Patient was seen 3 days ago and received a Depo-Medrol shot. She notes feeling better for a day after the injection. She mentions her roommate has similar symptoms. She had lab work done at that time, but her virus panel isn't back yet. She denies drinking much water, but still drinking plenty of Coke's.   She's cut back to smoking about half pack of cigarettes a day. She mentions father with history of pancreatic cancer, and mother with history of stroke. She hasn't scheduled mammogram or bone density yet.   Wt Readings from Last 3 Encounters:  08/30/17 102 lb (46.3 kg)  08/27/17 103 lb 12.8 oz (47.1 kg)  06/27/17 107 lb (48.5 kg)   Her weight has gone down 1 lb; informs eating normally.   Past Medical History:  Diagnosis Date  . ACUTE DUODEN ULCER W/HEMORR PERF&OBSTRUCTION 06/26/2004   Qualifier: Diagnosis of  By: [name] MD, [name]   . Acute duodenal ulcer with hemorrhage and perforation, with obstruction ([place])   . Allergy   . Anemia   . Anginal pain ([place])    "many many years ago"  . Anxiety    sees [name] NP at Dr. Radonna Ricker office  . Arthritis    "hips" (09/06/2014)  . Asthma   . Barrett's esophagus   . Chronic abdominal pain    narcotic dependence, dr gyarteng-dak at [facility]  . 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  Complication of anesthesia    pt states had too much xanax on board - agitated   . COPD (chronic obstructive pulmonary disease) ([place])   . Depression   . Exertional shortness of breath   . Fall 11/23/2014   frequent falls  . FEVER, RECURRENT 08/11/2009   Qualifier: Diagnosis of  By: [name] MD, [name]   . Fx of fibula 07-28-11   left fibula, 2 places   . Fx two ribs-open 08-24-11   left 4th and 5th  . GASTRIC OUTLET OBSTRUCTION 08/28/2007   In July 2008 she underwent laparoscopic enterolysis, Nissen fundoplication over a #40 bougie, single pledgeted suture colosure of the hiatus and a 3 suture wrap.  Lap truncal vagotomy and a loop gastrojejunostomy was performed.  This was revised in August of 2009 to a roux en Y gastrojejujostomy.     . Gastroparesis 06/03/2007   Qualifier: Diagnosis of  By: [name] MD, [name]   . GERD (gastroesophageal reflux disease)   . Headache    "monthly" (09/06/2014)  . History of blood transfusion    "related to OR; maybe when I perforated that ulcer"  . History of hiatal hernia   . Lap Nissen + truncal vagotomy July 2008 05/13/2013  . LEG EDEMA 01/19/2008   Qualifier: Diagnosis of  By: [name] MD,  [name]   . Macular degeneration, bilateral    early onset, severe, legally blind and worsening. Followed by dr. Katy Fitch  . Menopause   . MENOPAUSE 01/07/2007   Qualifier: Diagnosis of  By: [name], CMA, [place]    . Myocardial infarction ([place])   . Narcotic abuse ([place])   . Osteoporosis    severe  . Oxygen deficiency   . Peptic ulcer disease    dr Oletta Lamas  . Pneumonia, organism unspecified([place]) 11/07/2010   Assoc with R Parapneumonic effusion 09/2010    - Tapped 10/05/10    - CxR resolved 11/07/2010    . S/P jejunostomy Feb 2016 07/26/2014   Past Surgical History:  Procedure Laterality Date  . BIOPSY THYROID  08-13-11   benign nodule, per Dr. Melida Quitter   . CATARACT EXTRACTION W/ INTRAOCULAR LENS  IMPLANT, BILATERAL Bilateral   . COLONOSCOPY    . DILATION AND CURETTAGE OF  UTERUS    . ESOPHAGOGASTRODUODENOSCOPY  12-29-09   dr qadeer at [facility], several gastric ulcers  . ESOPHAGOGASTRODUODENOSCOPY N/A 09/25/2012   Procedure: ESOPHAGOGASTRODUODENOSCOPY (EGD);  Surgeon: Beryle Beams, MD;  Location: [facility];  Service: Endoscopy;  Laterality: N/A;  . ESOPHAGOGASTRODUODENOSCOPY (EGD) WITH PROPOFOL N/A 06/27/2017   Procedure: ESOPHAGOGASTRODUODENOSCOPY (EGD) WITH PROPOFOL;  Surgeon: Carol Ada, MD;  Location: [facility];  Service: Endoscopy;  Laterality: N/A;  . FRACTURE SURGERY    . GASTROJEJUNOSTOMY  02-20-08   Roux en Y gastrojejunsotomy w 1 foot Roux limb  . GASTROSTOMY N/A 07/26/2014   Procedure: LAPRASCOPIC ASSISTED OPEN PLACEMENT OF JEJUNOSTOMY TUBE;  Surgeon: Pedro Earls, MD;  Location: [facility];  Service: General;  Laterality: N/A;  . HERNIA REPAIR     "hiatal"  . JOINT REPLACEMENT    . LAPAROSCOPIC NISSEN FUNDOPLICATION    . LAPAROSCOPIC PARTIAL GASTRECTOMY N/A 06/29/2013   Procedure: Gastrectomy and GJ Tube placement;  Surgeon: Pedro Earls, MD;  Location: [facility];  Service: General;  Laterality: N/A;  . ORIF HIP FRACTURE Bilateral    "pins in both of them"  . REPAIR OF PERFORATED ULCER    . TONSILLECTOMY     Prior to Admission medications   Medication Sig Start Date End Date Taking? Authorizing Provider  albuterol (PROVENTIL HFA;VENTOLIN HFA) 108 (90 Base) MCG/ACT inhaler Inhale 2 puffs into the lungs every 4 (four) hours as needed (cough, shortness of breath or wheezing.). 08/01/15   Shawnee Knapp, MD  Aspirin-Acetaminophen-Caffeine (GOODY HEADACHE PO) Take 1 packet by mouth daily as needed (pain).    [provider]  busPIRone (BUSPAR) 30 MG tablet Take 1 tablet (30 mg total) by mouth 2 (two) times daily. 05/17/17   Shawnee Knapp, MD  DULoxetine (CYMBALTA) 30 MG capsule Take 30 mg by mouth daily.    Bulla, Donald, PA-C  DULoxetine (CYMBALTA) 60 MG capsule Take 60 mg by mouth daily.    Bulla, Donald, PA-C  HYDROcodone-acetaminophen  (NORCO/VICODIN) 5-325 MG tablet Take 1 tablet by mouth every 6 (six) hours as needed for moderate pain.    [provider]  ondansetron (ZOFRAN-ODT) 8 MG disintegrating tablet Take 1 tablet (8 mg total) by mouth every 8 (eight) hours as needed for nausea. 05/17/17   Shawnee Knapp, MD  promethazine (PHENERGAN) 6.25 MG/5ML syrup Take 20 mLs by mouth every 8 (eight) hours as needed for nausea. 05/27/17   Shawnee Knapp, MD  sucralfate (CARAFATE) 1 g tablet Take 1 tablet (1 g total) by mouth 4 (four) times daily -  with meals and at bedtime. 05/31/17   Shawnee Knapp, MD  traZODone (DESYREL) 150 MG tablet Take 1 tablet (150 mg total) by mouth at bedtime. 05/31/17   Shawnee Knapp, MD  vitamin B-12 (CYANOCOBALAMIN) 1000 MCG tablet Take 1,000 mcg by mouth 3 (three) times a week.    [provider]   Allergies  Allergen Reactions  . Lithium Nausea And Vomiting  . Celebrex [Celecoxib] Other (See Comments)    History of bleeding ulcers  . Morphine Itching  . Quetiapine Other (See Comments)     tardive dyskinesia  . Varenicline Tartrate Other (See Comments)    hallucinations   Family History  Problem Relation Age of Onset  . Cancer Mother        kidney, magliant tumor on face  . Stroke Mother   . Celiac disease Father   . Cancer Father        kidney and pancreatic   Social History   Socioeconomic History  . Marital status: Divorced    Spouse name: None  . Number of children: None  . Years of education: None  . Highest education level: None  Social Needs  . Financial resource strain: None  . Food insecurity - worry: None  . Food insecurity - inability: None  . Transportation needs - medical: None  . Transportation needs - non-medical: None  Occupational History  . None  Tobacco Use  . Smoking status: Current Every Day Smoker    Packs/day: 2.00    Years: 50.00    Pack years: 100.00    Types: Cigarettes  . Smokeless tobacco: Never Used  Substance and Sexual Activity  .  Alcohol use: No    Alcohol/week: 0.0 oz  . Drug use: No  . Sexual activity: No  Other Topics Concern  . None  Social History Narrative  . None   Depression screen [facility] 2/9 08/30/2017 08/27/2017 05/31/2017 05/26/2017 05/22/2017  Decreased Interest 0 0 0 3 -  Down, Depressed, Hopeless 0 0 0 1 2  PHQ - 2 Score 0 0 0 4 2  Altered sleeping - - - 2 -  Tired, decreased energy - - - 2 -  Change in appetite - - - 3 -  Feeling bad or failure about yourself  - - - 3 -  Trouble concentrating - - - 0 -  Moving slowly or fidgety/restless - - - 3 -  Suicidal thoughts - - - 0 -  PHQ-9 Score - - - 17 -  Difficult doing work/chores - - - Extremely dIfficult -  Some recent data might be hidden    Review of Systems  Constitutional: Positive for fatigue. Negative for chills, fever and unexpected weight change.  Respiratory: Negative for cough.   Gastrointestinal: Negative for constipation, diarrhea, nausea and vomiting.  Musculoskeletal: Positive for myalgias (generalized).  Skin: Negative for rash and wound.  Neurological: Negative for dizziness, weakness and headaches.       Objective:   Physical Exam  Constitutional: She is oriented to person, place, and time. She appears well-developed and well-nourished. No distress.  HENT:  Head: Normocephalic and atraumatic.  Eyes: EOM are normal. Pupils are equal, round, and reactive to light.  Neck: Neck supple.  Cardiovascular: Normal rate.  Pulmonary/Chest: Effort normal. No respiratory distress.  Musculoskeletal: Normal range of motion.  Neurological: She is alert and oriented to person, place, and time.  Skin: Skin is warm and dry.  Psychiatric: She has a normal mood and  affect. Her behavior is normal.  Nursing note and vitals reviewed.   BP 138/82   Pulse 82   Temp 98.6 F (37 C) (Oral)   Resp 16   Ht 5' 4.37" (1.635 m)   Wt 102 lb (46.3 kg)   SpO2 98%   BMI 17.31 kg/m      Assessment & Plan:   1. Loss of weight - check lipase - pt  asked about poss of pancreatic cancer since her father passed with this which presented with sig weight loss.  Had RUQ abd [place] sev mos ago which did not remark upon pancreas but no other recent abd imaging. Reminded pt that if cancer was cause of weight loss - breast would be far more common than pancreas so need to go get mammogram.   2. Postviral fatigue syndrome - reassurance, exam and labs nml sev d ago - initially felt better after IM steroids so ok to repeat x 1. Refrain from oral due to GI issues and freq use could worsen her severe untreated osteoporosis  3. Vitamin D deficiency - start high dose replacement  4. Other osteoporosis without current pathological fracture - needs update dexa. Numerous risk factors. Bisphosphonate contraindicated due to GI issues and chronic gastritis (awaiting Dr. Ulyses Amor notes which have been requested) so will refer to endocrine to discuss other treatment options. Pt has poor med compliance at baseline so would be a good candidate for in-office infusion like Reclast or Prolia I think and pt agrees.  5. Estrogen deficiency   6. Screening for breast cancer - reminded to sched mammogram  7. Tobacco use disorder - has appt for smoking cessation appt w Groce NP at [facility] on 3/20 followed by screening lung CT. Down to 2/3-3/4 ppd   Needs prevnar-13 at next OV - refrained today since given depo-medrol.  Orders Placed This Encounter  Procedures  . MM Digital Screening    Standing Status:   Future    Standing Expiration Date:   10/31/2018    Order Specific Question:   Reason for Exam (SYMPTOM  OR DIAGNOSIS REQUIRED)    Answer:   estrogen deficiency, osteoporosis, tobacco use    Order Specific Question:   Preferred imaging location?    Answer:   [facility]  . DG Bone Density    Standing Status:   Future    Standing Expiration Date:   10/31/2018    Order Specific Question:   Reason for Exam (SYMPTOM  OR DIAGNOSIS REQUIRED)    Answer:   screening    Order  Specific Question:   Preferred imaging location?    Answer:   [facility]  . Lipase  . Ambulatory referral to Endocrinology    Referral Priority:   Routine    Referral Type:   Consultation    Referral Reason:   Specialty Services Required    Number of Visits Requested:   1    Meds ordered this encounter  Medications  . Vitamin D, Ergocalciferol, (DRISDOL) 50000 units CAPS capsule    Sig: Take 1 capsule (50,000 Units total) by mouth every 7 (seven) days.    Dispense:  24 capsule    Refill:  1  . methylPREDNISolone acetate (DEPO-MEDROL) injection 120 mg    I personally performed the services described in this documentation, which was scribed in my presence. The recorded information has been reviewed and considered, and addended by me as needed.   [name], M.D.  Primary Care at [facility]  [place], [place] [address] [phone] phone [phone] fax  08/30/17 1:07 PM

## 2017-08-31 LAB — RESPIRATORY VIRUS PANEL
Adenovirus: NEGATIVE
INFLUENZA B 1: NEGATIVE
Influenza A: NEGATIVE
Metapneumovirus: NEGATIVE
PARAINFLUENZA 1 A: NEGATIVE
PARAINFLUENZA 2 A: NEGATIVE
PARAINFLUENZA 3 A: NEGATIVE
Respiratory Syncytial Virus A: NEGATIVE
Respiratory Syncytial Virus B: NEGATIVE
Rhinovirus: NEGATIVE

## 2017-08-31 LAB — LIPASE: LIPASE: 14 U/L (ref 14–72)

## 2017-09-02 ENCOUNTER — Encounter: Payer: Self-pay | Admitting: *Deleted

## 2017-09-02 NOTE — Progress Notes (Signed)
I contacted both facilities to get her info sent to PCP.

## 2017-09-03 ENCOUNTER — Encounter: Payer: Self-pay | Admitting: Family Medicine

## 2017-09-04 DIAGNOSIS — G4700 Insomnia, unspecified: Secondary | ICD-10-CM | POA: Diagnosis not present

## 2017-09-04 DIAGNOSIS — R5383 Other fatigue: Secondary | ICD-10-CM | POA: Diagnosis not present

## 2017-09-04 DIAGNOSIS — J449 Chronic obstructive pulmonary disease, unspecified: Secondary | ICD-10-CM | POA: Diagnosis not present

## 2017-09-04 DIAGNOSIS — Z79899 Other long term (current) drug therapy: Secondary | ICD-10-CM | POA: Diagnosis not present

## 2017-09-04 DIAGNOSIS — J209 Acute bronchitis, unspecified: Secondary | ICD-10-CM | POA: Diagnosis not present

## 2017-09-05 ENCOUNTER — Telehealth: Payer: Self-pay | Admitting: Acute Care

## 2017-09-05 ENCOUNTER — Other Ambulatory Visit: Payer: Self-pay | Admitting: Family Medicine

## 2017-09-05 NOTE — Telephone Encounter (Signed)
Rx refill request: Buspar 30 mg- (not clear when follow up for depression is due)  LOV: 08/30/17  PCP: [facility]: verified

## 2017-09-05 NOTE — Telephone Encounter (Signed)
Spoke with pt and rescheduled [facility] 09/17/17 10:30 CT will be rescheduled Nothing further needed

## 2017-09-10 ENCOUNTER — Encounter: Payer: Self-pay | Admitting: Acute Care

## 2017-09-10 ENCOUNTER — Ambulatory Visit: Payer: Self-pay | Admitting: *Deleted

## 2017-09-10 ENCOUNTER — Ambulatory Visit (INDEPENDENT_AMBULATORY_CARE_PROVIDER_SITE_OTHER): Payer: Medicare Other

## 2017-09-10 ENCOUNTER — Ambulatory Visit: Payer: Medicare Other | Admitting: Family Medicine

## 2017-09-10 ENCOUNTER — Inpatient Hospital Stay: Admission: RE | Admit: 2017-09-10 | Payer: Self-pay | Source: Ambulatory Visit

## 2017-09-10 ENCOUNTER — Other Ambulatory Visit: Payer: Self-pay

## 2017-09-10 ENCOUNTER — Encounter: Payer: Self-pay | Admitting: Family Medicine

## 2017-09-10 VITALS — BP 150/78 | HR 105 | Temp 98.4°F | Ht 63.78 in | Wt 102.6 lb

## 2017-09-10 DIAGNOSIS — G9331 Postviral fatigue syndrome: Secondary | ICD-10-CM

## 2017-09-10 DIAGNOSIS — I2584 Coronary atherosclerosis due to calcified coronary lesion: Secondary | ICD-10-CM

## 2017-09-10 DIAGNOSIS — R0789 Other chest pain: Secondary | ICD-10-CM

## 2017-09-10 DIAGNOSIS — J441 Chronic obstructive pulmonary disease with (acute) exacerbation: Secondary | ICD-10-CM | POA: Diagnosis not present

## 2017-09-10 DIAGNOSIS — G933 Postviral fatigue syndrome: Secondary | ICD-10-CM

## 2017-09-10 DIAGNOSIS — K660 Peritoneal adhesions (postprocedural) (postinfection): Secondary | ICD-10-CM | POA: Diagnosis not present

## 2017-09-10 DIAGNOSIS — I2510 Atherosclerotic heart disease of native coronary artery without angina pectoris: Secondary | ICD-10-CM | POA: Diagnosis not present

## 2017-09-10 DIAGNOSIS — Z79899 Other long term (current) drug therapy: Secondary | ICD-10-CM | POA: Diagnosis not present

## 2017-09-10 NOTE — Telephone Encounter (Signed)
Attempted to contact pt without success; unable to leave message at [phone].

## 2017-09-10 NOTE — Progress Notes (Signed)
Subjective:    Patient ID: [name], female    DOB: [birth year], 67 y.o.   MRN: [id number]  09/10/2017  Chest Pain (pt states that she just feels sick. Maybe flu, but the swab was neg) and COPD    HPI This 67 y.o. female presents for evaluation of chest pain, flu, and COPD exacerbation. Low grade fever; history of pneumonia and bronchitis.  Continues to suffer with sweats and chills. Positive persistent headache.  Denies sore throat, ear pain.  Mild rhinorrhea and nasal congestion present.  Denies nausea, vomiting, diarrhea. Chest really tight.  Chest tightness is persistent and constant.  Exertion does not worsen chest tightness.  Eating has no effect on chest tightness.  Patient not using albuterol HFA much at all during the day.  Not sure if albuterol helps with chest tightness. No coughing; no wheezing. Tightness in chest for 4 to 6 weeks since diagnosed with flu. .  Evaluated by Dr. Brigitte Pulse on 08/30/2017 for post viral fatigue syndrome and COPD exacerbation.  Received IM steroid injection at that time.  Patient continues to suffer with same symptoms as when evaluated by Dr. Brigitte Pulse.  Also seen on 08/27/17 by Brigitte Pulse underwent extensive labs including respiratory viral panel, conference of metabolic panel, magnesium, vitamin B12, vitamin D, hemoglobin A1c, CBC, urinalysis.  Patient also received Depo-Medrol 120 mg IM at that visit on 08/27/17.  Labs all normal except for low vitamin D level at 16.   Patient has not undergone chest x-ray or other imaging with acute illness. Roommate is very concerned with lack of improvement in the past couple of weeks. Known CAD; denies diaphoresis, nausea associated with chest tightness.  Immunization History  Administered Date(s) Administered  . Influenza Split 05/07/2014  . Influenza Whole 03/18/2008  . Influenza,inj,Quad PF,6+ Mos 02/23/2015, 03/02/2016, 03/13/2017  . Influenza-Unspecified 03/24/2014  . Pneumococcal Polysaccharide-23 03/31/2009  . Tdap  12/11/2015    Review of Systems  Constitutional: Positive for activity change, diaphoresis, fatigue and fever. Negative for appetite change, chills and unexpected weight change.  HENT: Positive for congestion and rhinorrhea. Negative for dental problem, drooling, ear discharge, ear pain, facial swelling, hearing loss, mouth sores, nosebleeds, postnasal drip, sinus pressure, sinus pain, sneezing, sore throat, tinnitus, trouble swallowing and voice change.   Eyes: Negative for photophobia, pain, discharge, redness, itching and visual disturbance.  Respiratory: Positive for chest tightness. Negative for apnea, cough, choking, shortness of breath, wheezing and stridor.   Cardiovascular: Negative for chest pain, palpitations and leg swelling.  Gastrointestinal: Negative for abdominal distention, abdominal pain, anal bleeding, blood in stool, constipation, diarrhea, nausea, rectal pain and vomiting.  Endocrine: Negative for cold intolerance, heat intolerance, polydipsia, polyphagia and polyuria.  Genitourinary: Negative for decreased urine volume, difficulty urinating, dyspareunia, dysuria, enuresis, flank pain, frequency, genital sores, hematuria, menstrual problem, pelvic pain, urgency, vaginal bleeding, vaginal discharge and vaginal pain.  Musculoskeletal: Negative for arthralgias, back pain, gait problem, joint swelling, myalgias, neck pain and neck stiffness.  Skin: Negative for color change, pallor, rash and wound.  Allergic/Immunologic: Negative for environmental allergies, food allergies and immunocompromised state.  Neurological: Negative for dizziness, tremors, seizures, syncope, facial asymmetry, speech difficulty, weakness, light-headedness, numbness and headaches.  Hematological: Negative for adenopathy. Does not bruise/bleed easily.  Psychiatric/Behavioral: Negative for agitation, behavioral problems, confusion, decreased concentration, dysphoric mood, hallucinations, self-injury, sleep  disturbance and suicidal ideas. The patient is nervous/anxious. The patient is not hyperactive.     Past Medical History:  Diagnosis Date  . Acute duodenal ulcer  with hemorrhage and perforation, with obstruction ([place]) 06/26/2004   multiple pyloric perferations  . Allergy   . Anemia   . Anginal pain ([place])    "many many years ago"  . Anxiety    sees [name] NP at Dr. Radonna Ricker office  . Arthritis    "hips" (09/06/2014)  . Asthma   . Barrett's esophagus   . Chronic abdominal pain    narcotic dependence, dr gyarteng-dak at [facility]  . 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 Complication of anesthesia    pt states had too much xanax on board - agitated   . COPD (chronic obstructive pulmonary disease) ([place])   . Depression   . Exertional shortness of breath   . Fall 11/23/2014   frequent falls  . Fx of fibula 07-28-11   left fibula, 2 places   . Fx two ribs-open 08-24-11   left 4th and 5th  . GASTRIC OUTLET OBSTRUCTION 08/28/2007   In July 2008 she underwent laparoscopic enterolysis, Nissen fundoplication over a #10 bougie, single pledgeted suture colosure of the hiatus and a 3 suture wrap.  Lap truncal vagotomy and a loop gastrojejunostomy was performed.  This was revised in August of 2009 to a roux en Y gastrojejujostomy.     . Gastroparesis 06/03/2007   Qualifier: Diagnosis of  By: [name] MD, [name]   . GERD (gastroesophageal reflux disease)   . History of blood transfusion    "related to OR; maybe when I perforated that ulcer"  . History of hiatal hernia   . Lap Nissen + truncal vagotomy July 2008 05/13/2013  . LEG EDEMA 01/19/2008   Qualifier: Diagnosis of  By: [name] MD, [name]   . Macular degeneration, bilateral    early onset, severe, legally blind and worsening. Followed by dr. Katy Fitch  . MENOPAUSE 01/07/2007   Qualifier: Diagnosis of  By: [name], CMA, [place]    . Myocardial infarction ([place])   . Narcotic abuse ([place])   . Osteoporosis     severe  . Oxygen deficiency   . Peptic ulcer disease    dr Oletta Lamas  . Pneumonia, organism unspecified([place]) 11/07/2010   Assoc with R Parapneumonic effusion 09/2010    - Tapped 10/05/10    - CxR resolved 11/07/2010    . S/P jejunostomy Feb 2016 07/26/2014  . Thyroid nodule 2013   benign after biopsy w/ Dr. Redmond Baseman   Past Surgical History:  Procedure Laterality Date  . BIOPSY THYROID  08-13-11   benign nodule, per Dr. Melida Quitter   . CATARACT EXTRACTION W/ INTRAOCULAR LENS  IMPLANT, BILATERAL Bilateral   . COLONOSCOPY    . DILATION AND CURETTAGE OF UTERUS    . ESOPHAGOGASTRODUODENOSCOPY  12-29-09   dr qadeer at [facility], several gastric ulcers  . ESOPHAGOGASTRODUODENOSCOPY N/A 09/25/2012   Procedure: ESOPHAGOGASTRODUODENOSCOPY (EGD);  Surgeon: Beryle Beams, MD;  Location: [facility];  Service: Endoscopy;  Laterality: N/A;  . ESOPHAGOGASTRODUODENOSCOPY (EGD) WITH PROPOFOL N/A 06/27/2017   Procedure: ESOPHAGOGASTRODUODENOSCOPY (EGD) WITH PROPOFOL;  Surgeon: Carol Ada, MD;  Location: [facility];  Service: Endoscopy;  Laterality: N/A;  . FRACTURE SURGERY Left 2010   hip and leg s/p fall  . GASTROJEJUNOSTOMY  02/20/2008   Roux en Y gastrojejunsotomy w 1 foot Roux limb, laparotomy, takedown loop gastrojejunostomy with removal of Ntinol stent  . GASTROSTOMY N/A 07/26/2014   Procedure: LAPRASCOPIC ASSISTED OPEN PLACEMENT OF JEJUNOSTOMY TUBE;  Surgeon: Pedro Earls, MD;  Location: [facility];  Service: General;  Laterality: N/A;  . HERNIA REPAIR     "hiatal"  . JOINT REPLACEMENT    . LAPAROSCOPIC NISSEN FUNDOPLICATION  6222  . LAPAROSCOPIC PARTIAL GASTRECTOMY N/A 06/29/2013   Procedure: Gastrectomy and GJ Tube placement;  Surgeon: Pedro Earls, MD;  Location: [facility];  Service: General;  Laterality: N/A;  . ORIF FEMORAL NECK FRACTURE W/ DHS Right 03/2014  . ORIF HIP FRACTURE Left 2010  . REPAIR OF PERFORATED ULCER    . TONSILLECTOMY  1974  . TRUNCAL VAGOTOMY  12/2006   Laparoscopic  enterolysis, laparoscopic Nissen's Fundoplication, laparoscopic truncal vagotomy & loop gastrojejunostomy   Allergies  Allergen Reactions  . Lithium Nausea And Vomiting  . Celebrex [Celecoxib] Other (See Comments)    History of bleeding ulcers  . Morphine Itching  . Quetiapine Other (See Comments)     tardive dyskinesia  . Varenicline Tartrate Other (See Comments)    hallucinations   Current Outpatient Medications on File Prior to Visit  Medication Sig Dispense Refill  . albuterol (PROVENTIL HFA;VENTOLIN HFA) 108 (90 Base) MCG/ACT inhaler Inhale 2 puffs into the lungs every 4 (four) hours as needed (cough, shortness of breath or wheezing.). 1 Inhaler 3  . Aspirin-Acetaminophen-Caffeine (GOODY HEADACHE PO) Take 1 packet by mouth daily as needed (pain).    . busPIRone (BUSPAR) 30 MG tablet Take 1 tablet (30 mg total) by mouth 2 (two) times daily. (Patient not taking: Reported on 10/17/2017) 60 tablet 1  . DULoxetine (CYMBALTA) 30 MG capsule Take 30 mg by mouth daily.    . DULoxetine (CYMBALTA) 60 MG capsule Take 60 mg by mouth daily.    Marland Kitchen HYDROcodone-acetaminophen (NORCO/VICODIN) 5-325 MG tablet Take 1 tablet by mouth every 6 (six) hours as needed for moderate pain.    . promethazine (PHENERGAN) 6.25 MG/5ML syrup Take 20 mLs by mouth every 8 (eight) hours as needed for nausea.    . sucralfate (CARAFATE) 1 g tablet Take 1 tablet (1 g total) by mouth 4 (four) times daily -  with meals and at bedtime. (Patient not taking: Reported on 10/17/2017) 120 tablet 0  . traZODone (DESYREL) 150 MG tablet Take 1 tablet (150 mg total) by mouth at bedtime. (Patient not taking: Reported on 10/17/2017) 30 tablet 0  . vitamin B-12 (CYANOCOBALAMIN) 1000 MCG tablet Take 1,000 mcg by mouth 3 (three) times a week.    . Vitamin D, Ergocalciferol, (DRISDOL) 50000 units CAPS capsule Take 1 capsule (50,000 Units total) by mouth every 7 (seven) days. 24 capsule 1  . busPIRone (BUSPAR) 10 MG tablet Take 2 tablets (20 mg  total) by mouth 3 (three) times daily. 180 tablet 0   No current facility-administered medications on file prior to visit.    Social History   Socioeconomic History  . Marital status: Divorced    Spouse name: Not on file  . Number of children: 0  . Years of education: Not on file  . Highest education level: Not on file  Occupational History  . Not on file  Social Needs  . Financial resource strain: Not on file  . Food insecurity:    Worry: Not on file    Inability: Not on file  . Transportation needs:    Medical: Not on file    Non-medical: Not on file  Tobacco Use  . Smoking status: Current Every Day Smoker    Packs/day: 2.00    Years: 51.00    Pack years: 102.00    Types: Cigarettes  .  Smokeless tobacco: Never Used  Substance and Sexual Activity  . Alcohol use: No    Alcohol/week: 0.0 oz  . Drug use: No  . Sexual activity: Never  Lifestyle  . Physical activity:    Days per week: Not on file    Minutes per session: Not on file  . Stress: Not on file  Relationships  . Social connections:    Talks on phone: Not on file    Gets together: Not on file    Attends religious service: Not on file    Active member of club or organization: Not on file    Attends meetings of clubs or organizations: Not on file    Relationship status: Not on file  . Intimate partner violence:    Fear of current or ex partner: Not on file    Emotionally abused: Not on file    Physically abused: Not on file    Forced sexual activity: Not on file  Other Topics Concern  . Not on file  Social History Narrative  . Not on file   Family History  Problem Relation Age of Onset  . Cancer Mother        kidney/bladder cancer, magliant skin cancer on face, cervical cancer  . Stroke Mother   . Hypertension Mother   . Celiac disease Father   . Cancer Father        metastatic renal cell carcinoma, pancreatic cancer, colon polyps  . Colon cancer Maternal Uncle   . Breast cancer Neg Hx   .  Ovarian cancer Neg Hx   . Endometrial cancer Neg Hx        Objective:    BP (!) 150/78 (BP Location: Left Arm, Patient Position: Sitting, Cuff Size: Normal)   Pulse (!) 105   Temp 98.4 F (36.9 C) (Oral)   Ht 5' 3.78" (1.62 m)   Wt 102 lb 9.6 oz (46.5 kg)   SpO2 99%   BMI 17.73 kg/m  Physical Exam  Constitutional: She is oriented to person, place, and time. She appears well-developed and well-nourished. No distress.  HENT:  Head: Normocephalic and atraumatic.  Right Ear: External ear normal.  Left Ear: External ear normal.  Nose: Nose normal.  Mouth/Throat: Oropharynx is clear and moist.  Eyes: Conjunctivae and EOM are normal. Pupils are equal, round, and reactive to light.  Neck: Normal range of motion. Neck supple. Carotid bruit is not present. No thyromegaly present.  Cardiovascular: Normal rate, regular rhythm, normal heart sounds and intact distal pulses. Exam reveals no gallop and no friction rub.  No murmur heard. Pulmonary/Chest: Effort normal and breath sounds normal. No respiratory distress. She has no wheezes. She has no rales.  Abdominal: Soft. Bowel sounds are normal. She exhibits no distension and no mass. There is no tenderness. There is no rebound and no guarding.  Lymphadenopathy:    She has no cervical adenopathy.  Neurological: She is alert and oriented to person, place, and time. No cranial nerve deficit. She exhibits normal muscle tone. Coordination normal.  Skin: Skin is warm and dry. No rash noted. She is not diaphoretic. No erythema. No pallor.  Psychiatric: She has a normal mood and affect. Her behavior is normal. Judgment and thought content normal.   No results found.       Assessment & Plan:   1. Chest tightness   2. COPD exacerbation ([place])   3. Postviral fatigue syndrome   4. Coronary artery disease due to calcified coronary lesion  Persistent chest tightness in the setting of recent influenza infection.  Also suffering with post viral  syndrome fatigue.  Reports persistent low-grade fever.  EKG stable.  Chest tightness not consistent with angina.  Obtain chest x-ray to rule out infiltrate.  Obtain CBC with reported low-grade fevers.  Recommend increasing albuterol to 4 times daily to see if chest tightness improves.  Reviewed recent labs in detail with patient and reassuring.  No distress in the office and pulse oximetry at 99% on room air.  S/p DepoMedrol IM x 2 in past month; thus, will avoid further steroids with normal lung exam in office and normal pulse oximetry; recommend increasing Albuterol use.   Orders Placed This Encounter  Procedures  . DG Chest 2 View    Standing Status:   Future    Number of Occurrences:   1    Standing Expiration Date:   09/10/2018    Order Specific Question:   Reason for Exam (SYMPTOM  OR DIAGNOSIS REQUIRED)    Answer:   chest tightness, recent illness; COPD exacerbation    Order Specific Question:   Preferred imaging location?    Answer:   External  . CBC with Differential/Platelet  . EKG 12-Lead   No orders of the defined types were placed in this encounter.   No follow-ups on file.   Mendell Bontempo Elayne Guerin, M.D. Primary Care at [facility] [place] [address] [place],   [address] [phone] phone [phone] fax

## 2017-09-10 NOTE — Telephone Encounter (Signed)
Juliann Pulse w/[facility] [phone]  Calling to f/u on status of RX request Buspar. Advised provider out of office today. Juliann Pulse will notify pt she f/u and that provider is out today.

## 2017-09-10 NOTE — Patient Instructions (Signed)
     IF you received an x-ray today, you will receive an invoice from [facility]. Please contact [facility] at [phone] with questions or concerns regarding your invoice.   IF you received labwork today, you will receive an invoice from [facility]. Please contact [facility] at [phone] with questions or concerns regarding your invoice.   Our billing staff will not be able to assist you with questions regarding bills from these companies.  You will be contacted with the lab results as soon as they are available. The fastest way to get your results is to activate your My Chart account. Instructions are located on the last page of this paperwork. If you have not heard from us regarding the results in 2 weeks, please contact this office.     

## 2017-09-10 NOTE — Telephone Encounter (Signed)
PTs roommate called to make apt for pt, She states the pt had a temp of 100.5 last night, pt has not got up yet this morning, Roommate is concerned because pt has flu like symptoms including no energy, cough, and has the temp of 100.5 Last night. I made the pt an apt at [facility] for 96 today, She wanted a female Dr only.      Left message for patient to make sure she is pushing fluids and taking something to control her temperature. Please keep appointment this afternoon so she can be evaluated at the office. Call back with any questions.

## 2017-09-10 NOTE — Telephone Encounter (Signed)
Attempted to contact pt without success; left message on voice mail [phone].

## 2017-09-11 LAB — CBC WITH DIFFERENTIAL/PLATELET
Basophils Absolute: 0.1 10*3/uL (ref 0.0–0.2)
Basos: 1 %
EOS (ABSOLUTE): 0.1 10*3/uL (ref 0.0–0.4)
EOS: 1 %
Hematocrit: 41.5 % (ref 34.0–46.6)
Hemoglobin: 13.4 g/dL (ref 11.1–15.9)
Immature Grans (Abs): 0.1 10*3/uL (ref 0.0–0.1)
Immature Granulocytes: 1 %
LYMPHS ABS: 1.9 10*3/uL (ref 0.7–3.1)
Lymphs: 11 %
MCH: 29.6 pg (ref 26.6–33.0)
MCHC: 32.3 g/dL (ref 31.5–35.7)
MCV: 92 fL (ref 79–97)
MONOS ABS: 1.1 10*3/uL — AB (ref 0.1–0.9)
Monocytes: 7 %
Neutrophils Absolute: 13.1 10*3/uL — ABNORMAL HIGH (ref 1.4–7.0)
Neutrophils: 79 %
Platelets: 330 10*3/uL (ref 150–379)
RBC: 4.52 x10E6/uL (ref 3.77–5.28)
RDW: 14.8 % (ref 12.3–15.4)
WBC: 16.4 10*3/uL — AB (ref 3.4–10.8)

## 2017-09-12 NOTE — Telephone Encounter (Signed)
Pt was prescribed a 2 mo supply of buspar 30mg  bid (highest dose) 4 mos ago so not sure she is still taking or taking correctly. Pt unable to tell me - just takes whatever meds her roommate [name] gives her and [name] states she is taking. Changed rx to same total daily dose but at 20mg  tid but does not come in 20mg  tab so used 2 10mg  tabs tid - hopefully this way if she has been off of it, she can wean back up more slowly.

## 2017-09-16 ENCOUNTER — Telehealth: Payer: Self-pay | Admitting: Acute Care

## 2017-09-17 ENCOUNTER — Encounter: Payer: Self-pay | Admitting: Acute Care

## 2017-09-17 ENCOUNTER — Inpatient Hospital Stay: Admission: RE | Admit: 2017-09-17 | Payer: Self-pay | Source: Ambulatory Visit

## 2017-09-17 NOTE — Telephone Encounter (Signed)
Spoke with pt and rescheduled [facility] 10/01/17 11:00 CT will be rescheduled Nothing further needed

## 2017-09-18 ENCOUNTER — Ambulatory Visit: Payer: Medicare Other | Admitting: Family Medicine

## 2017-09-18 DIAGNOSIS — J449 Chronic obstructive pulmonary disease, unspecified: Secondary | ICD-10-CM | POA: Diagnosis not present

## 2017-09-19 ENCOUNTER — Ambulatory Visit: Payer: Medicare Other | Admitting: Family Medicine

## 2017-09-20 ENCOUNTER — Ambulatory Visit: Payer: Medicare Other | Admitting: Family Medicine

## 2017-09-23 ENCOUNTER — Ambulatory Visit: Payer: Medicare Other | Admitting: Family Medicine

## 2017-09-29 ENCOUNTER — Telehealth: Payer: Self-pay | Admitting: Acute Care

## 2017-09-29 NOTE — Telephone Encounter (Signed)
Called and spoke to pt. Pt states she would like to reschedule her SDMV that is currently scheduled for 4/10. Advised pt that Langley Gauss will return on 4/10 and call her then to reschedule her appt. Pt verbalized understanding. Will route to the lung nodule pool.

## 2017-09-30 NOTE — Telephone Encounter (Signed)
LMTC x 1  

## 2017-09-30 NOTE — Telephone Encounter (Signed)
Spoke with pt .  She has decided to keep appt for lung cancer screening 10/01/17.  Nothing further needed.

## 2017-10-01 ENCOUNTER — Ambulatory Visit (INDEPENDENT_AMBULATORY_CARE_PROVIDER_SITE_OTHER)
Admission: RE | Admit: 2017-10-01 | Discharge: 2017-10-01 | Disposition: A | Discharge status: Home / Self Care | Payer: Medicare Other | Source: Ambulatory Visit | Attending: Acute Care | Admitting: Acute Care

## 2017-10-01 ENCOUNTER — Inpatient Hospital Stay: Admission: RE | Admit: 2017-10-01 | Payer: Self-pay | Source: Ambulatory Visit

## 2017-10-01 ENCOUNTER — Encounter: Payer: Self-pay | Admitting: Acute Care

## 2017-10-01 ENCOUNTER — Ambulatory Visit (INDEPENDENT_AMBULATORY_CARE_PROVIDER_SITE_OTHER): Payer: Medicare Other | Admitting: Acute Care

## 2017-10-01 DIAGNOSIS — Z122 Encounter for screening for malignant neoplasm of respiratory organs: Secondary | ICD-10-CM

## 2017-10-01 DIAGNOSIS — F17210 Nicotine dependence, cigarettes, uncomplicated: Secondary | ICD-10-CM

## 2017-10-01 NOTE — Progress Notes (Signed)
Shared Decision Making Visit Lung Cancer Screening Program [phone])   Eligibility:  Age 67 y.o.  Pack Years Smoking History Calculation 51 pack year smoking history (# packs/per year x # years smoked)  Recent History of coughing up blood  no  Unexplained weight loss? no ( >Than 15 pounds within the last 6 months )  Prior History Lung / other cancer no (Diagnosis within the last 5 years already requiring surveillance chest CT Scans).  Smoking Status Current Smoker  Former Smokers: Years since quit: NA  Quit Date: NA  Visit Components:  Discussion included one or more decision making aids. yes  Discussion included risk/benefits of screening. yes  Discussion included potential follow up diagnostic testing for abnormal scans. yes  Discussion included meaning and risk of over diagnosis. yes  Discussion included meaning and risk of False Positives. yes  Discussion included meaning of total radiation exposure. yes  Counseling Included:  Importance of adherence to annual lung cancer LDCT screening. yes  Impact of comorbidities on ability to participate in the program. yes  Ability and willingness to under diagnostic treatment. yes  Smoking Cessation Counseling:  Current Smokers:   Discussed importance of smoking cessation. yes  Information about tobacco cessation classes and interventions provided to patient. yes  Patient provided with "ticket" for LDCT Scan. yes  Symptomatic Patient. no  Counseling  Diagnosis Code: Tobacco Use Z72.0  Asymptomatic Patient yes  Counseling (Intermediate counseling: > three minutes counseling) O8416  Former Smokers:   Discussed the importance of maintaining cigarette abstinence. yes  Diagnosis Code: Personal History of Nicotine Dependence. S06.301  Information about tobacco cessation classes and interventions provided to patient. Yes  Patient provided with "ticket" for LDCT Scan. yes  Written Order for Lung Cancer  Screening with LDCT placed in Epic. Yes (CT Chest Lung Cancer Screening Low Dose W/O CM) [id number] Z12.2-Screening of respiratory organs Z87.891-Personal history of nicotine dependence  I have spent 25 minutes of face to face time with [name] discussing the risks and benefits of lung cancer screening. We viewed a power point together that explained in detail the above noted topics. We paused at intervals to allow for questions to be asked and answered to ensure understanding.We discussed that the single most powerful action that she can take to decrease her risk of developing lung cancer is to quit smoking. We discussed whether or not she is ready to commit to setting a quit date. She is not ready to set a quit date. We discussed options for tools to aid in quitting smoking including nicotine replacement therapy, non-nicotine medications, support groups, Quit Smart classes, and behavior modification. We discussed that often times setting smaller, more achievable goals, such as eliminating 1 cigarette a day for a week and then 2 cigarettes a day for a week can be helpful in slowly decreasing the number of cigarettes smoked. This allows for a sense of accomplishment as well as providing a clinical benefit. I gave her the " Be Stronger Than Your Excuses" card with contact information for community resources, classes, free nicotine replacement therapy, and access to mobile apps, text messaging, and on-line smoking cessation help. I have also given her my card and contact information in the event she needs to contact me. We discussed the time and location of the scan, and that either [name] Glassman RN or I will call with the results within 24-48 hours of receiving them. I have offered her  a copy of the power point we viewed  as  a resource in the event they need reinforcement of the concepts we discussed today in the office. The patient verbalized understanding of all of  the above and had no further questions upon  leaving the office. They have my contact information in the event they have any further questions.  I spent 4 minutes counseling on smoking cessation and the health risks of continued tobacco abuse.  I explained to the patient that there has been a high incidence of coronary artery disease noted on these exams. I explained that this is a non-gated exam therefore degree or severity cannot be determined. This patient is not on statin therapy. I have asked the patient to follow-up with their PCP regarding any incidental finding of coronary artery disease and management with diet or medication as their PCP  feels is clinically indicated. The patient verbalized understanding of the above and had no further questions upon completion of the visit.      Magdalen Spatz, NP 10/01/2017 11:27 AM

## 2017-10-06 ENCOUNTER — Other Ambulatory Visit: Payer: Self-pay | Admitting: Acute Care

## 2017-10-06 DIAGNOSIS — F17210 Nicotine dependence, cigarettes, uncomplicated: Principal | ICD-10-CM

## 2017-10-06 DIAGNOSIS — Z122 Encounter for screening for malignant neoplasm of respiratory organs: Secondary | ICD-10-CM

## 2017-10-09 ENCOUNTER — Ambulatory Visit: Payer: Medicare Other | Admitting: Family Medicine

## 2017-10-15 DIAGNOSIS — Z79899 Other long term (current) drug therapy: Secondary | ICD-10-CM | POA: Diagnosis not present

## 2017-10-15 DIAGNOSIS — K660 Peritoneal adhesions (postprocedural) (postinfection): Secondary | ICD-10-CM | POA: Diagnosis not present

## 2017-10-17 ENCOUNTER — Encounter (HOSPITAL_COMMUNITY): Payer: Self-pay

## 2017-10-17 ENCOUNTER — Ambulatory Visit: Payer: Self-pay

## 2017-10-17 ENCOUNTER — Other Ambulatory Visit: Payer: Self-pay

## 2017-10-17 ENCOUNTER — Encounter: Payer: Self-pay | Admitting: Physician Assistant

## 2017-10-17 ENCOUNTER — Emergency Department (HOSPITAL_COMMUNITY)
Admission: EM | Admit: 2017-10-17 | Discharge: 2017-10-17 | Disposition: A | Discharge status: Home / Self Care | Payer: Medicare Other | Attending: Emergency Medicine | Admitting: Emergency Medicine

## 2017-10-17 ENCOUNTER — Telehealth: Payer: Self-pay

## 2017-10-17 ENCOUNTER — Telehealth: Payer: Self-pay | Admitting: Physician Assistant

## 2017-10-17 ENCOUNTER — Emergency Department (HOSPITAL_COMMUNITY): Payer: Medicare Other

## 2017-10-17 ENCOUNTER — Ambulatory Visit: Payer: Medicare Other | Admitting: Physician Assistant

## 2017-10-17 VITALS — BP 134/72 | HR 100 | Temp 98.2°F | Resp 18 | Ht 63.58 in | Wt 103.6 lb

## 2017-10-17 DIAGNOSIS — J449 Chronic obstructive pulmonary disease, unspecified: Secondary | ICD-10-CM | POA: Insufficient documentation

## 2017-10-17 DIAGNOSIS — R110 Nausea: Secondary | ICD-10-CM | POA: Insufficient documentation

## 2017-10-17 DIAGNOSIS — R1010 Upper abdominal pain, unspecified: Secondary | ICD-10-CM | POA: Diagnosis not present

## 2017-10-17 DIAGNOSIS — K2970 Gastritis, unspecified, without bleeding: Secondary | ICD-10-CM

## 2017-10-17 DIAGNOSIS — Z79899 Other long term (current) drug therapy: Secondary | ICD-10-CM | POA: Insufficient documentation

## 2017-10-17 DIAGNOSIS — R1013 Epigastric pain: Secondary | ICD-10-CM | POA: Diagnosis not present

## 2017-10-17 DIAGNOSIS — F17210 Nicotine dependence, cigarettes, uncomplicated: Secondary | ICD-10-CM | POA: Diagnosis not present

## 2017-10-17 DIAGNOSIS — F1193 Opioid use, unspecified with withdrawal: Secondary | ICD-10-CM

## 2017-10-17 DIAGNOSIS — R6889 Other general symptoms and signs: Secondary | ICD-10-CM

## 2017-10-17 DIAGNOSIS — K219 Gastro-esophageal reflux disease without esophagitis: Secondary | ICD-10-CM | POA: Diagnosis not present

## 2017-10-17 DIAGNOSIS — N281 Cyst of kidney, acquired: Secondary | ICD-10-CM | POA: Diagnosis not present

## 2017-10-17 DIAGNOSIS — F1120 Opioid dependence, uncomplicated: Secondary | ICD-10-CM

## 2017-10-17 DIAGNOSIS — R1011 Right upper quadrant pain: Secondary | ICD-10-CM

## 2017-10-17 DIAGNOSIS — M791 Myalgia, unspecified site: Secondary | ICD-10-CM | POA: Diagnosis not present

## 2017-10-17 DIAGNOSIS — F1123 Opioid dependence with withdrawal: Secondary | ICD-10-CM

## 2017-10-17 DIAGNOSIS — M7918 Myalgia, other site: Secondary | ICD-10-CM | POA: Insufficient documentation

## 2017-10-17 DIAGNOSIS — G8929 Other chronic pain: Secondary | ICD-10-CM | POA: Insufficient documentation

## 2017-10-17 DIAGNOSIS — Z8719 Personal history of other diseases of the digestive system: Secondary | ICD-10-CM

## 2017-10-17 DIAGNOSIS — R52 Pain, unspecified: Secondary | ICD-10-CM

## 2017-10-17 DIAGNOSIS — R1030 Lower abdominal pain, unspecified: Secondary | ICD-10-CM | POA: Diagnosis present

## 2017-10-17 LAB — POCT URINALYSIS DIP (MANUAL ENTRY)
BILIRUBIN UA: NEGATIVE mg/dL
Bilirubin, UA: NEGATIVE
Blood, UA: NEGATIVE
Glucose, UA: NEGATIVE mg/dL
LEUKOCYTES UA: NEGATIVE
Nitrite, UA: NEGATIVE
Protein Ur, POC: NEGATIVE mg/dL
SPEC GRAV UA: 1.02 (ref 1.010–1.025)
Urobilinogen, UA: 0.2 E.U./dL
pH, UA: 5.5 (ref 5.0–8.0)

## 2017-10-17 LAB — CMP14+EGFR
ALT: 10 IU/L (ref 0–32)
AST: 17 IU/L (ref 0–40)
Albumin/Globulin Ratio: 2 (ref 1.2–2.2)
Albumin: 4.6 g/dL (ref 3.6–4.8)
Alkaline Phosphatase: 111 IU/L (ref 39–117)
BUN/Creatinine Ratio: 11 — ABNORMAL LOW (ref 12–28)
BUN: 9 mg/dL (ref 8–27)
Bilirubin Total: <0.2 mg/dL (ref 0.0–1.2)
CALCIUM: 9 mg/dL (ref 8.7–10.3)
CO2: 19 mmol/L — AB (ref 20–29)
CREATININE: 0.83 mg/dL (ref 0.57–1.00)
Chloride: 97 mmol/L (ref 96–106)
GFR calc Af Amer: 86 mL/min/{1.73_m2} (ref >59)
GFR, EST NON AFRICAN AMERICAN: 74 mL/min/{1.73_m2} (ref >59)
GLUCOSE: 100 mg/dL — AB (ref 65–99)
Globulin, Total: 2.3 g/dL (ref 1.5–4.5)
POTASSIUM: 4.4 mmol/L (ref 3.5–5.2)
Sodium: 134 mmol/L (ref 134–144)
TOTAL PROTEIN: 6.9 g/dL (ref 6.0–8.5)

## 2017-10-17 LAB — COMPREHENSIVE METABOLIC PANEL
ALT: 14 U/L (ref 14–54)
AST: 20 U/L (ref 15–41)
Albumin: 4.4 g/dL (ref 3.5–5.0)
Alkaline Phosphatase: 97 U/L (ref 38–126)
Anion gap: 15 (ref 5–15)
BILIRUBIN TOTAL: 0.5 mg/dL (ref 0.3–1.2)
BUN: 11 mg/dL (ref 6–20)
CHLORIDE: 97 mmol/L — AB (ref 101–111)
CO2: 21 mmol/L — ABNORMAL LOW (ref 22–32)
Calcium: 8.8 mg/dL — ABNORMAL LOW (ref 8.9–10.3)
Creatinine, Ser: 0.9 mg/dL (ref 0.44–1.00)
Glucose, Bld: 105 mg/dL — ABNORMAL HIGH (ref 65–99)
POTASSIUM: 3.8 mmol/L (ref 3.5–5.1)
Sodium: 133 mmol/L — ABNORMAL LOW (ref 135–145)
TOTAL PROTEIN: 8 g/dL (ref 6.5–8.1)

## 2017-10-17 LAB — POCT CBC
GRANULOCYTE PERCENT: 81.8 % — AB (ref 37–80)
HEMATOCRIT: 42 % (ref 37.7–47.9)
Hemoglobin: 14.2 g/dL (ref 12.2–16.2)
Lymph, poc: 1.7 (ref 0.6–3.4)
MCH: 30.4 pg (ref 27–31.2)
MCHC: 33.9 g/dL (ref 31.8–35.4)
MCV: 89.9 fL (ref 80–97)
MID (CBC): 0.3 (ref 0–0.9)
MPV: 9.6 fL (ref 0–99.8)
POC GRANULOCYTE: 9 — AB (ref 2–6.9)
POC LYMPH %: 15.7 % (ref 10–50)
POC MID %: 2.5 % (ref 0–12)
Platelet Count, POC: 403 10*3/uL (ref 142–424)
RBC: 4.68 M/uL (ref 4.04–5.48)
RDW, POC: 14.7 %
WBC: 11 10*3/uL — AB (ref 4.6–10.2)

## 2017-10-17 LAB — GLUCOSE, POCT (MANUAL RESULT ENTRY): POC Glucose: 95 mg/dl (ref 70–99)

## 2017-10-17 LAB — CBC
HCT: 40.9 % (ref 36.0–46.0)
Hemoglobin: 13.6 g/dL (ref 12.0–15.0)
MCH: 30.8 pg (ref 26.0–34.0)
MCHC: 33.3 g/dL (ref 30.0–36.0)
MCV: 92.7 fL (ref 78.0–100.0)
PLATELETS: 413 10*3/uL — AB (ref 150–400)
RBC: 4.41 MIL/uL (ref 3.87–5.11)
RDW: 14.2 % (ref 11.5–15.5)
WBC: 11 10*3/uL — AB (ref 4.0–10.5)

## 2017-10-17 LAB — LIPASE, BLOOD: LIPASE: 20 U/L (ref 11–51)

## 2017-10-17 LAB — URINALYSIS, ROUTINE W REFLEX MICROSCOPIC
Bilirubin Urine: NEGATIVE
Glucose, UA: NEGATIVE mg/dL
Hgb urine dipstick: NEGATIVE
KETONES UR: NEGATIVE mg/dL
LEUKOCYTES UA: NEGATIVE
NITRITE: NEGATIVE
PROTEIN: NEGATIVE mg/dL
Specific Gravity, Urine: 1.014 (ref 1.005–1.030)
pH: 5 (ref 5.0–8.0)

## 2017-10-17 LAB — LIPASE: Lipase: 12 U/L — ABNORMAL LOW (ref 14–72)

## 2017-10-17 MED ORDER — RANITIDINE HCL 150 MG PO TABS: 150.0000 mg | ORAL_TABLET | Freq: Two times a day (BID) | ORAL | 0 refills | Status: DC

## 2017-10-17 MED ORDER — HYDROMORPHONE HCL 1 MG/ML IJ SOLN
0.5000 mg | Freq: Once | INTRAMUSCULAR | Status: AC
  Administered 2017-10-17: 0.5 mg via INTRAVENOUS
  Filled 2017-10-17: qty 1

## 2017-10-17 MED ORDER — ONDANSETRON HCL 4 MG/2ML IJ SOLN
4.0000 mg | Freq: Once | INTRAMUSCULAR | Status: AC
  Administered 2017-10-17: 4 mg via INTRAVENOUS
  Filled 2017-10-17: qty 2

## 2017-10-17 MED ORDER — GI COCKTAIL ~~LOC~~
30.0000 mL | Freq: Once | ORAL | Status: AC
  Administered 2017-10-17: 30 mL via ORAL
  Filled 2017-10-17: qty 30

## 2017-10-17 MED ORDER — FAMOTIDINE IN NACL 20-0.9 MG/50ML-% IV SOLN
20.0000 mg | Freq: Once | INTRAVENOUS | Status: AC
  Administered 2017-10-17: 20 mg via INTRAVENOUS
  Filled 2017-10-17: qty 50

## 2017-10-17 MED ORDER — ONDANSETRON 4 MG PO TBDP: 4.0000 mg | ORAL_TABLET | Freq: Three times a day (TID) | ORAL | 0 refills | Status: DC | PRN

## 2017-10-17 MED ORDER — SODIUM CHLORIDE 0.9 % IV BOLUS
1000.0000 mL | Freq: Once | INTRAVENOUS | Status: AC
  Administered 2017-10-17: 1000 mL via INTRAVENOUS

## 2017-10-17 MED ORDER — ONDANSETRON 8 MG PO TBDP: 8.0000 mg | ORAL_TABLET | Freq: Three times a day (TID) | ORAL | 2 refills | Status: DC | PRN

## 2017-10-17 NOTE — Telephone Encounter (Signed)
Per telephone notes on 4/26, pt is currently being treated in ED.

## 2017-10-17 NOTE — ED Provider Notes (Signed)
[facility] DEPT Provider Note   CSN: [id number] Arrival date & time: 10/17/17  1419     History   Chief Complaint No chief complaint on file.   HPI MARILYN NIHISER is a 67 y.o. female with a PMHx of duodenal ulcer, anemia, asthma, barrett's esophagus, chronic abd pain on chronic narcotics, COPD, gastroparesis, GERD, and multiple other conditions listed below, with PSHx of Roux en Y, gastrostomy, nissen fundiplication, partial gastrectomy, and truncal vagectomy, as well as multiple other surgical procedures listed below, who presents to the ED with complaints of "not feeling good" for 1 day with associated nausea, body aches, and chills.  She also has chronic abd pain but it's been a little worse than usual; she takes chronic hydrocodone for abd pain, but she ran out about 3 days ago (however yesterday went to her pain clinic and had it refilled, so she has it again at home).  Per chart review, pt was seen at [facility] for these symptoms, they did blood work which showed a mildly elevated WBC at 11.0, sent her for an outpatient RUQ U/S on Monday however she states she continues to have symptoms so she came here.  She describes her abdominal pain is 8/10 intermittent aching nonradiating generalized abdominal pain that worsens with eating and is somewhat improved with her home hydrocodone.  Her surgeon is Dr. Hassell Done, she has not been able to get in to see him in order to potentially have her gallbladder out, in January she found that she has gallbladder sludge and would likely need to have her gallbladder taken out eventually.  Her PCP is Dr. [name] at [facility].  She denies any fevers >100, rhinorrhea, sore throat, URI symptoms, cough, CP, SOB, vomiting, diarrhea/constipation, obstipation, melena, hematochezia, hematuria, dysuria, vaginal bleeding/discharge, focal arthralgias, rashes, numbness, tingling, focal weakness, or any other complaints at this time. Denies recent  travel, sick contacts, suspicious food intake, EtOH use, or NSAID use.   The history is provided by the patient and medical records. No language interpreter was used.    Past Medical History:  Diagnosis Date  . Acute duodenal ulcer with hemorrhage and perforation, with obstruction ([place]) 06/26/2004   multiple pyloric perferations  . Allergy   . Anemia   . Anginal pain ([place])    "many many years ago"  . Anxiety    sees [name] NP at Dr. Radonna Ricker office  . Arthritis    "hips" (09/06/2014)  . Asthma   . Barrett's esophagus   . Chronic abdominal pain    narcotic dependence, dr gyarteng-dak at [facility]  . 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 Complication of anesthesia    pt states had too much xanax on board - agitated   . COPD (chronic obstructive pulmonary disease) ([place])   . Depression   . Exertional shortness of breath   . Fall 11/23/2014   frequent falls  . Fx of fibula 07-28-11   left fibula, 2 places   . Fx two ribs-open 08-24-11   left 4th and 5th  . GASTRIC OUTLET OBSTRUCTION 08/28/2007   In July 2008 she underwent laparoscopic enterolysis, Nissen fundoplication over a #08 bougie, single pledgeted suture colosure of the hiatus and a 3 suture wrap.  Lap truncal vagotomy and a loop gastrojejunostomy was performed.  This was revised in August of 2009 to a roux en Y gastrojejujostomy.     . Gastroparesis 06/03/2007   Qualifier: Diagnosis of  By: [name] MD, [name] GERD (gastroesophageal reflux disease)   . History of blood transfusion    "related to OR; maybe when I perforated that ulcer"  . History of hiatal hernia   . Lap Nissen + truncal vagotomy July 2008 05/13/2013  . LEG EDEMA 01/19/2008   Qualifier: Diagnosis of  By: [name] MD, [name]   . Macular degeneration, bilateral    early onset, severe, legally blind and worsening. Followed by dr. Katy Fitch  . MENOPAUSE 01/07/2007   Qualifier: Diagnosis of  By: [name], CMA, [place]    .  Myocardial infarction ([place])   . Narcotic abuse ([place])   . Osteoporosis    severe  . Oxygen deficiency   . Peptic ulcer disease    dr Oletta Lamas  . Pneumonia, organism unspecified(486) 11/07/2010   Assoc with R Parapneumonic effusion 09/2010    - Tapped 10/05/10    - CxR resolved 11/07/2010    . S/P jejunostomy Feb 2016 07/26/2014  . Thyroid nodule 2013   benign after biopsy w/ Dr. Redmond Baseman    Patient Active Problem List   Diagnosis Date Noted  . Abdominal pain, chronic, epigastric 05/31/2017  . Episodic polysubstance dependence ([place]) 05/31/2017  . At high risk for injury related to fall 05/18/2017  . Chronic nausea 05/18/2017  . Poor compliance with medication 11/20/2016  . Insomnia 11/20/2016  . Early onset macular degeneration 11/20/2016  . Legally blind 11/20/2016  . Abdominal pain   . Hypoxia 04/08/2015  . Opioid type dependence, continuous ([place])   . Protein-calorie malnutrition ([place])   . Episode of recurrent major depressive disorder ([place])   . Secondary hyperparathyroidism ([place]) 01/10/2015  . Vitamin D deficiency 01/10/2015  . 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 Anemia, iron deficiency 11/03/2014  . Chronic pain syndrome 10/24/2014  . Osteoporosis 10/05/2014  . Vertebral compression fracture ([place]) 10/05/2014  . Coronary artery disease due to calcified coronary lesion 10/05/2014  . Weight loss   . Tobacco abuse   . History of subtotal gastrectomy with Roux-en-Y recontruction   . Orthostatic hypotension 08/11/2014  . Memory loss, short term 07/18/2014  . Constipation, chronic 07/10/2013  . Chronic abdominal pain   . COPD (chronic obstructive pulmonary disease) ([place]) 11/07/2010  . Major depressive disorder, recurrent episode, moderate ([place]) 06/06/2010  . 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 Anxiety state 01/07/2007  . Barrett's esophagus 07/03/2005    Past Surgical History:  Procedure Laterality Date  .  BIOPSY THYROID  08-13-11   benign nodule, per Dr. Melida Quitter   . CATARACT EXTRACTION W/ INTRAOCULAR LENS  IMPLANT, BILATERAL Bilateral   . COLONOSCOPY    . DILATION AND CURETTAGE OF UTERUS    . ESOPHAGOGASTRODUODENOSCOPY  12-29-09   dr qadeer at [facility], several gastric ulcers  . ESOPHAGOGASTRODUODENOSCOPY N/A 09/25/2012   Procedure: ESOPHAGOGASTRODUODENOSCOPY (EGD);  Surgeon: Beryle Beams, MD;  Location: [facility];  Service: Endoscopy;  Laterality: N/A;  . ESOPHAGOGASTRODUODENOSCOPY (EGD) WITH PROPOFOL N/A 06/27/2017   Procedure: ESOPHAGOGASTRODUODENOSCOPY (EGD) WITH PROPOFOL;  Surgeon: Carol Ada, MD;  Location: [facility];  Service: Endoscopy;  Laterality: N/A;  . FRACTURE SURGERY Left 2010   hip and leg s/p fall  . GASTROJEJUNOSTOMY  02/20/2008   Roux en Y gastrojejunsotomy w 1 foot Roux limb, laparotomy, takedown loop gastrojejunostomy with removal of Ntinol stent  . GASTROSTOMY N/A 07/26/2014   Procedure: LAPRASCOPIC ASSISTED OPEN PLACEMENT OF JEJUNOSTOMY TUBE;  Surgeon: Pedro Earls,  MD;  Location: [facility];  Service: General;  Laterality: N/A;  . HERNIA REPAIR     "hiatal"  . JOINT REPLACEMENT    . LAPAROSCOPIC NISSEN FUNDOPLICATION  9323  . LAPAROSCOPIC PARTIAL GASTRECTOMY N/A 06/29/2013   Procedure: Gastrectomy and GJ Tube placement;  Surgeon: Pedro Earls, MD;  Location: [facility];  Service: General;  Laterality: N/A;  . ORIF FEMORAL NECK FRACTURE W/ DHS Right 03/2014  . ORIF HIP FRACTURE Left 2010  . REPAIR OF PERFORATED ULCER    . TONSILLECTOMY  1974  . TRUNCAL VAGOTOMY  12/2006   Laparoscopic enterolysis, laparoscopic Nissen's Fundoplication, laparoscopic truncal vagotomy & loop gastrojejunostomy     OB History   None      Home Medications    Prior to Admission medications   Medication Sig Start Date End Date Taking? Authorizing Provider  albuterol (PROVENTIL HFA;VENTOLIN HFA) 108 (90 Base) MCG/ACT inhaler Inhale 2 puffs into the lungs every 4 (four) hours  as needed (cough, shortness of breath or wheezing.). 08/01/15   Shawnee Knapp, MD  Aspirin-Acetaminophen-Caffeine (GOODY HEADACHE PO) Take 1 packet by mouth daily as needed (pain).    [provider]  busPIRone (BUSPAR) 10 MG tablet Take 2 tablets (20 mg total) by mouth 3 (three) times daily. 09/12/17   Shawnee Knapp, MD  busPIRone (BUSPAR) 30 MG tablet Take 1 tablet (30 mg total) by mouth 2 (two) times daily. 05/17/17   Shawnee Knapp, MD  DULoxetine (CYMBALTA) 30 MG capsule Take 30 mg by mouth daily.    Bulla, Donald, PA-C  DULoxetine (CYMBALTA) 60 MG capsule Take 60 mg by mouth daily.    Bulla, Donald, PA-C  HYDROcodone-acetaminophen (NORCO/VICODIN) 5-325 MG tablet Take 1 tablet by mouth every 6 (six) hours as needed for moderate pain.    [provider]  ondansetron (ZOFRAN-ODT) 8 MG disintegrating tablet Take 1 tablet (8 mg total) by mouth every 8 (eight) hours as needed for nausea. 10/17/17   Tenna Delaine D, PA-C  promethazine (PHENERGAN) 6.25 MG/5ML syrup Take 20 mLs by mouth every 8 (eight) hours as needed for nausea. 05/27/17   Shawnee Knapp, MD  sucralfate (CARAFATE) 1 g tablet Take 1 tablet (1 g total) by mouth 4 (four) times daily -  with meals and at bedtime. 05/31/17   Shawnee Knapp, MD  traZODone (DESYREL) 150 MG tablet Take 1 tablet (150 mg total) by mouth at bedtime. 05/31/17   Shawnee Knapp, MD  vitamin B-12 (CYANOCOBALAMIN) 1000 MCG tablet Take 1,000 mcg by mouth 3 (three) times a week.    [provider]  Vitamin D, Ergocalciferol, (DRISDOL) 50000 units CAPS capsule Take 1 capsule (50,000 Units total) by mouth every 7 (seven) days. 08/30/17   Shawnee Knapp, MD    Family History Family History  Problem Relation Age of Onset  . Cancer Mother        kidney/bladder cancer, magliant skin cancer on face, cervical cancer  . Stroke Mother   . Hypertension Mother   . Celiac disease Father   . Cancer Father        metastatic renal cell carcinoma, pancreatic cancer, colon  polyps  . Colon cancer Maternal Uncle   . Breast cancer Neg Hx   . Ovarian cancer Neg Hx   . Endometrial cancer Neg Hx     Social History Social History   Tobacco Use  . Smoking status: Current Every Day Smoker    Packs/day: 2.00  Years: 51.00    Pack years: 102.00    Types: Cigarettes  . Smokeless tobacco: Never Used  Substance Use Topics  . Alcohol use: No    Alcohol/week: 0.0 oz  . Drug use: No     Allergies   Lithium; Celebrex [celecoxib]; Morphine; Quetiapine; and Varenicline tartrate   Review of Systems Review of Systems  Constitutional: Positive for chills. Negative for fever.  HENT: Negative for rhinorrhea and sore throat.   Respiratory: Negative for cough and shortness of breath.   Cardiovascular: Negative for chest pain.  Gastrointestinal: Positive for abdominal pain and nausea. Negative for blood in stool, constipation, diarrhea and vomiting.  Genitourinary: Negative for dysuria, hematuria, vaginal bleeding and vaginal discharge.  Musculoskeletal: Positive for myalgias (body aches). Negative for arthralgias.  Skin: Negative for rash.  Allergic/Immunologic: Negative for immunocompromised state.  Neurological: Negative for weakness and numbness.  Psychiatric/Behavioral: Negative for confusion.   All other systems reviewed and are negative for acute change except as noted in the HPI.    Physical Exam Updated Vital Signs BP (!) 149/69 (BP Location: Left Arm)   Pulse 87   Temp 98.6 F (37 C) (Oral)   Resp 16   Ht 5\' 4"  (1.626 m)   Wt 47.2 kg (104 lb)   SpO2 100%   BMI 17.85 kg/m    Physical Exam  Constitutional: She is oriented to person, place, and time. Vital signs are normal. She appears well-developed and well-nourished.  Non-toxic appearance. No distress.  Afebrile, nontoxic, NAD, appears older than stated age  HENT:  Head: Normocephalic and atraumatic.  Mouth/Throat: Oropharynx is clear and moist and mucous membranes are normal.  Eyes:  Conjunctivae and EOM are normal. Right eye exhibits no discharge. Left eye exhibits no discharge.  Neck: Normal range of motion. Neck supple.  Cardiovascular: Normal rate, regular rhythm, normal heart sounds and intact distal pulses. Exam reveals no gallop and no friction rub.  No murmur heard. Pulmonary/Chest: Effort normal and breath sounds normal. No respiratory distress. She has no decreased breath sounds. She has no wheezes. She has no rhonchi. She has no rales.  Abdominal: Soft. Normal appearance and bowel sounds are normal. She exhibits no distension. There is tenderness in the right upper quadrant and epigastric area. There is positive Murphy's sign. There is no rigidity, no rebound, no guarding, no CVA tenderness and no tenderness at McBurney's point.  Soft, nondistended, +BS throughout, with moderate upper abd TTP mostly over epigastric area and RUQ, no r/g/r, +murphy's, neg mcburney's, no CVA TTP   Musculoskeletal: Normal range of motion.  Neurological: She is alert and oriented to person, place, and time. She has normal strength. No sensory deficit.  Skin: Skin is warm, dry and intact. No rash noted.  Psychiatric: She has a normal mood and affect.  Nursing note and vitals reviewed.    ED Treatments / Results  Labs (all labs ordered are listed, but only abnormal results are displayed) Labs Reviewed  COMPREHENSIVE METABOLIC PANEL - Abnormal; Notable for the following components:      Result Value   Sodium 133 (*)    Chloride 97 (*)    CO2 21 (*)    Glucose, Bld 105 (*)    Calcium 8.8 (*)    All other components within normal limits  CBC - Abnormal; Notable for the following components:   WBC 11.0 (*)    Platelets 413 (*)    All other components within normal limits  LIPASE, BLOOD  URINALYSIS,  ROUTINE W REFLEX MICROSCOPIC    EKG None  Radiology US Abdomen Complete  Result Date: 10/17/2017 CLINICAL DATA:  Right upper quadrant and epigastric pain. EXAM: ABDOMEN  ULTRASOUND COMPLETE COMPARISON:  07/02/2017 ultrasound FINDINGS: Gallbladder: Similar to prior, the gallbladder is distended to roughly 11 cm in length and 4 cm in diameter but thin walled and without pericholecystic fluid. The single wall thickness of the gallbladder is 2.3 mm which is within normal limits. Layering biliary sludge is redemonstrated. No calculi. No sonographic Murphy sign noted by sonographer. Common bile duct: Diameter: 5.2 mm and without choledocholithiasis. Liver: No focal lesion identified. Within normal limits in parenchymal echogenicity. Mild surface nodularity compatible with morphologic changes of mild cirrhosis. Portal vein is patent on color Doppler imaging with normal direction of blood flow towards the liver. IVC: No abnormality visualized. Pancreas: The pancreas head and proximal body as well as uncinate process are unremarkable. The tail is obscured by bowel gas. Spleen: Size and appearance within normal limits. Right Kidney: Length: 9.9 cm. Echogenicity within normal limits. No mass or hydronephrosis visualized. Left Kidney: Length: 9.5 cm. Echogenicity within normal limits. Anechoic exophytic cyst off the upper pole of the left kidney is identified measuring 2.4 x 2 x 2 cm. No mass or hydronephrosis visualized. Abdominal aorta: No aneurysm visualized. Other findings: None. IMPRESSION: 1. Distended gallbladder with scant amount of biliary sludge along the dependent wall. No secondary signs of acute cholecystitis however are identified. 2. Simple exophytic left upper pole renal cyst measuring 2.4 x 2 x 2 cm. 3. Subtle nodular contour of the liver may represent morphologic changes of cirrhosis. Electronically Signed   By: Ashley Royalty M.D.   On: 10/17/2017 19:38    Procedures Procedures (including critical care time)  Medications Ordered in ED Medications  sodium chloride 0.9 % bolus 1,000 mL (0 mLs Intravenous Stopped 10/17/17 1941)  ondansetron (ZOFRAN) injection 4 mg (4 mg  Intravenous Given 10/17/17 1810)  HYDROmorphone (DILAUDID) injection 0.5 mg (0.5 mg Intravenous Given 10/17/17 1810)  gi cocktail (Maalox,Lidocaine,Donnatal) (30 mLs Oral Given 10/17/17 1819)  famotidine (PEPCID) IVPB 20 mg premix (0 mg Intravenous Stopped 10/17/17 1849)     Initial Impression / Assessment and Plan / ED Course  I have reviewed the triage vital signs and the nursing notes.  Pertinent labs & imaging results that were available during my care of the patient were reviewed by me and considered in my medical decision making (see chart for details).     67 y.o. female here with generalized not feeling well and nausea/body aches x1 day. Has chronic abd pain but having slightly more than usual. Went to [facility] and they did labs which showed mildly elevated WBC, scheduled her for outpatient abd U/S on Monday; however she continues to have symptoms so she came here. On exam, moderate upper abd TTP most focally in epigastrum and RUQ, +murphy's, nonperitoneal. Afebrile and nontoxic. Work up thus far reveals: U/A WNL, lipase WNL, CBC with marginally elevated WBC 11.0 and plt 413, CMP with mildly low Na 133 Cl 97 bicarb 21 but otherwise unremarkable. Will proceed with abd U/S to further assess her gallbladder. Discussed that some of her symptoms could be from withdrawal from her chronic opiates. Will give pain/nausea meds, fluids, and reassess shortly. Discussed case with my attending Dr. Winfred Leeds who agrees with plan.   9:26 PM Abd U/S showing distended gallbladder with scant amount of biliary sludge along dependent wall but no evidence of cholecystitis; also with exophytic  left upper pole renal cyst and nodular contour of liver which could represent cirrhosis. Pt feeling better and tolerating PO well here. Some of her symptoms could be from opiate withdrawal, in addition to gastritis/GERD/PUD and some component of biliary colic. Discussed diet/lifestyle modifications for symptoms, will start on  zantac/zofran, advised tylenol and avoidance/sparing use of NSAIDs only on full stomach, discussed other OTC remedies for symptomatic relief, and f/up with PCP and her surgeon in 5-7 days for recheck of symptoms and ongoing evaluation/management.    Final Clinical Impressions(s) / ED Diagnoses   Final diagnoses:  Body aches  Nausea  Upper abdominal pain  Hx of gastroesophageal reflux (GERD)  Gastritis, presence of bleeding unspecified, unspecified chronicity, unspecified gastritis type  Opiate withdrawal [facility])    ED Discharge Orders        Ordered    ranitidine (ZANTAC) 150 MG tablet  2 times daily     10/17/17 2035    ondansetron (ZOFRAN ODT) 4 MG disintegrating tablet  Every 8 hours PRN     10/17/17 [address] [place], [place] 10/17/17 2126    Orlie Dakin, MD 10/18/17 [phone]

## 2017-10-17 NOTE — ED Notes (Addendum)
Pt. friend cell phone number Norma [phone].

## 2017-10-17 NOTE — Telephone Encounter (Signed)
FYI

## 2017-10-17 NOTE — Patient Instructions (Addendum)
Your white count was elevated mildly, which could be due to withdrawal or underlying infection.  Since we cannot get your ultrasound scheduled for today, I would like you to increase your fluids and return tomorrow for repeat evaluation.  I have ordered stat labs and should have those back within a few hours.  If those are elevated, I will contact you and ask you to go to the ED.  If overnight, any of your symptoms worsen or you develop new concerning symptoms, please seek care immediately at the ED.  Otherwise follow-up tomorrow.  IF you received an x-ray today, you will receive an invoice from [facility]. Please contact [facility] at [phone] with questions or concerns regarding your invoice.   IF you received labwork today, you will receive an invoice from [facility]. Please contact [facility] at [phone] with questions or concerns regarding your invoice.   Our billing staff will not be able to assist you with questions regarding bills from these companies.  You will be contacted with the lab results as soon as they are available. The fastest way to get your results is to activate your My Chart account. Instructions are located on the last page of this paperwork. If you have not heard from [place] regarding the results in 2 weeks, please contact this office.

## 2017-10-17 NOTE — Telephone Encounter (Unsigned)
Copied from [place] [phone]. Topic: Quick Communication - See Telephone Encounter >> Oct 17, 2017  2:01 PM Neva Seat wrote: [name] - [phone] called to let Tenna Delaine know she is taking pt to [facility] is not feeling better and needing immediate care.

## 2017-10-17 NOTE — ED Triage Notes (Signed)
Pt reports loss of appetite and nausea over the last 3 days. This morning she started feeling more nauseous and "shaky." No LOC. She was seen at [facility] today and they recommended to come here if she didn't feel better.

## 2017-10-17 NOTE — ED Notes (Signed)
Ultrasound at bedside

## 2017-10-17 NOTE — ED Provider Notes (Signed)
Patient complains of right-sided abdominal pain for several years which is worse with eating nonexertional.  She has known gallbladder sludge.  She reports a temperature of somewhere between 100 and 101 degrees earlier today.  She had no vomiting.  Her pain is presently minimal.  She denies nausea at present.  She last ate crackers at 11 AM today.  Presently discomfort is minimal.  On exam alert, chronically ill-appearing.  No distress lungs clear to auscultation heart regular rate and rhythm abdomen tender at right upper quadrant without guarding rigidity or rebound.  Extremities without edema   Orlie Dakin, MD 10/17/17 2028

## 2017-10-17 NOTE — Progress Notes (Signed)
[name]  MRN: [id number] DOB: November 23, 1950  Subjective:  [name] is a 67 y.o. female seen in office today for a chief complaint of "not feeling well." Feels some "heat on the inside," weakness, and fatigue. Has PMH of chronic stomach pain, seen by pain management. Takes norco 5-348m four times a day x years.  Ran out of meds a few days early this month (~4 days ago) and that is when her sx starting. Received norco refill yesterday. She is feeling better now that she got her medication but was expecting to be feeling completely back to normal and does not. Has decreased appetite and nausea, which is baseline for her. Denies chest pain, SOB, diaphoresis, vomiting, numbness, tingling, visual disturbance, and confusion. Will not drink water but consumes pepsi all throughout the day and coffee in the morning. Smokes 1 pack of cigarettes a day. Has been having normal BM. Last one was this morning. Pt last ate right before arrival. Denies recent travel, sick contacts, suspicious food intake, EtOH use, or NSAID use. Has PMH of gallbladder sludge. Last RUQ [place] 07/02/17 showed gallbladder sludge and distension, was questionable if she should have surgery. [facility] contacted them since she was feeling so poorly but could not get in today. Has extensive hx of PSH, please see below. She is accompanied by roommate.   Review of Systems  Constitutional: Negative for chills.  HENT: Negative for ear pain, rhinorrhea, sinus pressure and sore throat.   Respiratory: Negative for cough.   Cardiovascular: Negative for chest pain and palpitations.  Gastrointestinal: Negative for blood in stool.  Genitourinary: Negative for dysuria, frequency, hematuria and urgency.  Neurological: Negative for dizziness and light-headedness.    Patient Active Problem List   Diagnosis Date Noted  . Abdominal pain, chronic, epigastric 05/31/2017  . Episodic polysubstance dependence ([place] 05/31/2017  .  At high risk for injury related to fall 05/18/2017  . Chronic nausea 05/18/2017  . Poor compliance with medication 11/20/2016  . Insomnia 11/20/2016  . Early onset macular degeneration 11/20/2016  . Legally blind 11/20/2016  . Abdominal pain   . Hypoxia 04/08/2015  . Opioid type dependence, continuous ([place]   . Protein-calorie malnutrition ([place]   . Episode of recurrent major depressive disorder ([place]   . Secondary hyperparathyroidism ([place] 01/10/2015  . Vitamin D deficiency 01/10/2015  . 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 Anemia, iron deficiency 11/03/2014  . Chronic pain syndrome 10/24/2014  . Osteoporosis 10/05/2014  . Vertebral compression fracture ([place] 10/05/2014  . Coronary artery disease due to calcified coronary lesion 10/05/2014  . Weight loss   . Tobacco abuse   . History of subtotal gastrectomy with Roux-en-Y recontruction   . Orthostatic hypotension 08/11/2014  . Memory loss, short term 07/18/2014  . Constipation, chronic 07/10/2013  . Chronic abdominal pain   . COPD (chronic obstructive pulmonary disease) ([place] 11/07/2010  . Major depressive disorder, recurrent episode, moderate ([place] 06/06/2010  . 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 Anxiety state 01/07/2007  . Barrett's esophagus 07/03/2005    Current Outpatient Medications on File Prior to Visit  Medication Sig Dispense Refill  . albuterol (PROVENTIL HFA;VENTOLIN HFA) 108 (90 Base) MCG/ACT inhaler Inhale 2 puffs into the lungs every 4 (four) hours as needed (cough, shortness of breath or wheezing.). 1 Inhaler 3  . Aspirin-Acetaminophen-Caffeine (GOODY HEADACHE PO) Take 1 packet by mouth daily as needed (pain).    . busPIRone (  BUSPAR) 10 MG tablet Take 2 tablets (20 mg total) by mouth 3 (three) times daily. 180 tablet 0  . busPIRone (BUSPAR) 30 MG tablet Take 1 tablet (30 mg total) by mouth 2 (two) times daily. 60 tablet 1  . DULoxetine  (CYMBALTA) 30 MG capsule Take 30 mg by mouth daily.    . DULoxetine (CYMBALTA) 60 MG capsule Take 60 mg by mouth daily.    Marland Kitchen HYDROcodone-acetaminophen (NORCO/VICODIN) 5-325 MG tablet Take 1 tablet by mouth every 6 (six) hours as needed for moderate pain.    Marland Kitchen ondansetron (ZOFRAN-ODT) 8 MG disintegrating tablet Take 1 tablet (8 mg total) by mouth every 8 (eight) hours as needed for nausea. 20 tablet 2  . promethazine (PHENERGAN) 6.25 MG/5ML syrup Take 20 mLs by mouth every 8 (eight) hours as needed for nausea.    . sucralfate (CARAFATE) 1 g tablet Take 1 tablet (1 g total) by mouth 4 (four) times daily -  with meals and at bedtime. 120 tablet 0  . traZODone (DESYREL) 150 MG tablet Take 1 tablet (150 mg total) by mouth at bedtime. 30 tablet 0  . vitamin B-12 (CYANOCOBALAMIN) 1000 MCG tablet Take 1,000 mcg by mouth 3 (three) times a week.    . Vitamin D, Ergocalciferol, (DRISDOL) 50000 units CAPS capsule Take 1 capsule (50,000 Units total) by mouth every 7 (seven) days. 24 capsule 1   No current facility-administered medications on file prior to visit.     Allergies  Allergen Reactions  . Lithium Nausea And Vomiting  . Celebrex [Celecoxib] Other (See Comments)    History of bleeding ulcers  . Morphine Itching  . Quetiapine Other (See Comments)     tardive dyskinesia  . Varenicline Tartrate Other (See Comments)    hallucinations   Past Surgical History:  Procedure Laterality Date  . BIOPSY THYROID  08-13-11   benign nodule, per Dr. Melida Quitter   . CATARACT EXTRACTION W/ INTRAOCULAR LENS  IMPLANT, BILATERAL Bilateral   . COLONOSCOPY    . DILATION AND CURETTAGE OF UTERUS    . ESOPHAGOGASTRODUODENOSCOPY  12-29-09   dr qadeer at [facility], several gastric ulcers  . ESOPHAGOGASTRODUODENOSCOPY N/A 09/25/2012   Procedure: ESOPHAGOGASTRODUODENOSCOPY (EGD);  Surgeon: Beryle Beams, MD;  Location: [facility];  Service: Endoscopy;  Laterality: N/A;  . ESOPHAGOGASTRODUODENOSCOPY (EGD) WITH PROPOFOL  N/A 06/27/2017   Procedure: ESOPHAGOGASTRODUODENOSCOPY (EGD) WITH PROPOFOL;  Surgeon: Carol Ada, MD;  Location: [facility];  Service: Endoscopy;  Laterality: N/A;  . FRACTURE SURGERY Left 2010   hip and leg s/p fall  . GASTROJEJUNOSTOMY  02/20/2008   Roux en Y gastrojejunsotomy w 1 foot Roux limb, laparotomy, takedown loop gastrojejunostomy with removal of Ntinol stent  . GASTROSTOMY N/A 07/26/2014   Procedure: LAPRASCOPIC ASSISTED OPEN PLACEMENT OF JEJUNOSTOMY TUBE;  Surgeon: Pedro Earls, MD;  Location: [facility];  Service: General;  Laterality: N/A;  . HERNIA REPAIR     "hiatal"  . JOINT REPLACEMENT    . LAPAROSCOPIC NISSEN FUNDOPLICATION  5397  . LAPAROSCOPIC PARTIAL GASTRECTOMY N/A 06/29/2013   Procedure: Gastrectomy and GJ Tube placement;  Surgeon: Pedro Earls, MD;  Location: [facility];  Service: General;  Laterality: N/A;  . ORIF FEMORAL NECK FRACTURE W/ DHS Right 03/2014  . ORIF HIP FRACTURE Left 2010  . REPAIR OF PERFORATED ULCER    . TONSILLECTOMY  1974  . TRUNCAL VAGOTOMY  12/2006   Laparoscopic enterolysis, laparoscopic Nissen's Fundoplication, laparoscopic truncal vagotomy & loop gastrojejunostomy  Social History   Socioeconomic History  . Marital status: Divorced    Spouse name: Not on file  . Number of children: 0  . Years of education: Not on file  . Highest education level: Not on file  Occupational History  . Not on file  Social Needs  . Financial resource strain: Not on file  . Food insecurity:    Worry: Not on file    Inability: Not on file  . Transportation needs:    Medical: Not on file    Non-medical: Not on file  Tobacco Use  . Smoking status: Current Every Day Smoker    Packs/day: 2.00    Years: 51.00    Pack years: 102.00    Types: Cigarettes  . Smokeless tobacco: Never Used  Substance and Sexual Activity  . Alcohol use: No    Alcohol/week: 0.0 oz  . Drug use: No  . Sexual activity: Never  Lifestyle  . Physical activity:    Days  per week: Not on file    Minutes per session: Not on file  . Stress: Not on file  Relationships  . Social connections:    Talks on phone: Not on file    Gets together: Not on file    Attends religious service: Not on file    Active member of club or organization: Not on file    Attends meetings of clubs or organizations: Not on file    Relationship status: Not on file  . Intimate partner violence:    Fear of current or ex partner: Not on file    Emotionally abused: Not on file    Physically abused: Not on file    Forced sexual activity: Not on file  Other Topics Concern  . Not on file  Social History Narrative  . Not on file    Objective:  BP 134/72 (BP Location: Left Arm, Patient Position: Sitting, Cuff Size: Normal)   Pulse 100   Temp 98.2 F (36.8 C) (Oral)   Resp 18   Ht 5' 3.58" (1.615 m)   Wt 103 lb 9.6 oz (47 kg)   SpO2 99%   BMI 18.02 kg/m   Physical Exam  Constitutional: She is oriented to person, place, and time. Vital signs are normal. No distress.  Appears older than stated age.  Sitting up on exam table talking with roommate, NAD, non toxic appearing.   HENT:  Head: Normocephalic and atraumatic.  Right Ear: Tympanic membrane and ear canal normal.  Left Ear: Tympanic membrane and ear canal normal.  Nose: Nose normal.  Mouth/Throat: Uvula is midline. Mucous membranes are dry (mildly). No posterior oropharyngeal edema or posterior oropharyngeal erythema. No tonsillar exudate.  Eyes: Conjunctivae are normal.  Neck: Normal range of motion.  Cardiovascular: Normal rate, regular rhythm, normal heart sounds and intact distal pulses.  Pulmonary/Chest: Effort normal and breath sounds normal. She has no wheezes. She has no rhonchi. She has no rales.  Abdominal: There is tenderness (mild) in the right upper quadrant. There is no rigidity, no rebound, no guarding, no tenderness at McBurney's point and negative Murphy's sign.  Multiple old scars noted on abdomen.    Neurological: She is alert and oriented to person, place, and time.  Skin: Skin is warm and dry.  Psychiatric: She has a normal mood and affect.  Vitals reviewed.    Results for orders placed or performed in visit on 10/17/17 (from the past 24 hour(s))  POCT CBC  Status: Abnormal   Collection Time: 10/17/17 12:37 PM  Result Value Ref Range   WBC 11.0 (A) 4.6 - 10.2 K/uL   Lymph, poc 1.7 0.6 - 3.4   POC LYMPH PERCENT 15.7 10 - 50 %L   MID (cbc) 0.3 0 - 0.9   POC MID % 2.5 0 - 12 %M   POC Granulocyte 9.0 (A) 2 - 6.9   Granulocyte percent 81.8 (A) 37 - 80 %G   RBC 4.68 4.04 - 5.48 M/uL   Hemoglobin 14.2 12.2 - 16.2 g/dL   HCT, POC 42.0 37.7 - 47.9 %   MCV 89.9 80 - 97 fL   MCH, POC 30.4 27 - 31.2 pg   MCHC 33.9 31.8 - 35.4 g/dL   RDW, POC 14.7 %   Platelet Count, POC 403 142 - 424 K/uL   MPV 9.6 0 - 99.8 fL  POCT glucose (manual entry)     Status: None   Collection Time: 10/17/17 12:43 PM  Result Value Ref Range   POC Glucose 95 70 - 99 mg/dl  POCT urinalysis dipstick     Status: None   Collection Time: 10/17/17 12:43 PM  Result Value Ref Range   Color, UA yellow yellow   Clarity, UA clear clear   Glucose, UA negative negative mg/dL   Bilirubin, UA negative negative   Ketones, POC UA negative negative mg/dL   Spec Grav, UA 1.020 1.010 - 1.025   Blood, UA negative negative   pH, UA 5.5 5.0 - 8.0   Protein Ur, POC negative negative mg/dL   Urobilinogen, UA 0.2 0.2 or 1.0 E.U./dL   Nitrite, UA Negative Negative   Leukocytes, UA Negative Negative    Wt Readings from Last 3 Encounters:  10/17/17 103 lb 9.6 oz (47 kg)  09/10/17 102 lb 9.6 oz (46.5 kg)  08/30/17 102 lb (46.3 kg)   EKG shows sinus rhythm with rate of 83 bpm. No acute ST or T wave abnormality.  No acute changes noted from prior EKG of 09/20/17. Findings presented and discussed with Dr. Pamella Pert.    Assessment and Plan :  This case was precepted with Dr. Pamella Pert.   1. Opiate withdrawal ([place]) Suspect  patient's symptoms are due to opiate withdrawal.  She is nontoxic-appearing, in no distress, and is afebrile. Vitals stable. She has mild tenderness to palpation in right upper quadrant, neg Murphy's sign.   Point-of-care labs do reveal mildly elevated white count at 11.0. UA normal, EKG normal. Due to her hx of gallbladder sludge with current sx, exam findings, and mildly elevated WBC, recommend STAT US to r/o cholecystitis. Unfortunately, all imaging centers refused to schedule stat RUQ ultrasound today as patient has not been fasting for 10 hours and since it is Friday and they are closed over the weekend,earliest they can schedule it is Monday. Since pt is overall well appearing and afebrile, will order STAT CMP and lipase and schedule [place] for first thing Monday morning. If liver enzymes or pancreatic enzymes elevated, will send to ED. Otherwise, recommend increasing fluids, returning tomorrow for reevaluation, and RUQ [place] on Monday,10/20/17. Given strict ED precautions if any sx worsen or she develops any new concerning sx. Pt and roommate voice their understanding and agree to plan.   2. RUQ pain - US Abdomen Limited RUQ; Future - CMP14+EGFR - Lipase  3. Feeling bad - POCT CBC - POCT glucose (manual entry) - POCT urinalysis dipstick - EKG 12-Lead - US Abdomen Limited RUQ; Future  4. Nausea without vomiting Meds ordered this encounter  Medications  . ondansetron (ZOFRAN-ODT) 8 MG disintegrating tablet    Sig: Take 1 tablet (8 mg total) by mouth every 8 (eight) hours as needed for nausea.    Dispense:  20 tablet    Refill:  2    Order Specific Question:   Supervising Provider    Answer:   Wardell Honour [[id number]]   5. Abdominal pain, chronic, epigastric  6. Opioid type dependence, continuous ([place])  Tenna Delaine PA-C  [facility] 10/17/2017 12:44 PM

## 2017-10-17 NOTE — Discharge Instructions (Addendum)
Your labs today are reassuring. Some of your symptoms could be from withdrawal from narcotics, since you recently ran out of your pain medications. You have gallbladder sludge which can sometimes cause pain and nausea, you will need to see your surgeon for ongoing management of this issue. Your abdominal pain could also be from gastritis or an ulcer. You will need to take zantac as directed, and avoid spicy/fatty/acidic foods, avoid soda/coffee/tea/alcohol. Avoid laying down flat within 30 minutes of eating. Avoid NSAIDs like ibuprofen/aleve/motrin/etc on an empty stomach. May consider using over the counter tums/maalox as needed for additional relief. Use zofran as directed as needed for nausea. Use your home pain medications as needed for pain but don't drive or operate machinery while taking this medication. Follow up with your regular doctor in 5-7 days for recheck of symptoms as well as with your surgeon for ongoing management of your gallbladder problems. Return to the ER for changes or worsening symptoms.  Abdominal (belly) pain can be caused by many things. Your caregiver performed an examination and possibly ordered blood/urine tests and imaging (CT scan, x-rays, ultrasound). Many cases can be observed and treated at home after initial evaluation in the emergency department. Even though you are being discharged home, abdominal pain can be unpredictable. Therefore, you need a repeated exam if your pain does not resolve, returns, or worsens. Most patients with abdominal pain don't have to be admitted to the hospital or have surgery, but serious problems like appendicitis and gallbladder attacks can start out as nonspecific pain. Many abdominal conditions cannot be diagnosed in one visit, so follow-up evaluations are very important. SEEK IMMEDIATE MEDICAL ATTENTION IF YOU DEVELOP ANY OF THE FOLLOWING SYMPTOMS: The pain does not go away or becomes severe.  A temperature above 101 develops.  Repeated  vomiting occurs (multiple episodes).  The pain becomes localized to portions of the abdomen. The right side could possibly be appendicitis. In an adult, the left lower portion of the abdomen could be colitis or diverticulitis.  Blood is being passed in stools or vomit (bright red or black tarry stools).  Return also if you develop chest pain, difficulty breathing, dizziness or fainting, or become confused, poorly responsive, or inconsolable (young children). The constipation stays for more than 4 days.  There is belly (abdominal) or rectal pain.  You do not seem to be getting better.

## 2017-10-19 DIAGNOSIS — J449 Chronic obstructive pulmonary disease, unspecified: Secondary | ICD-10-CM | POA: Diagnosis not present

## 2017-10-20 ENCOUNTER — Ambulatory Visit (HOSPITAL_COMMUNITY): Payer: Medicare Other | Attending: Physician Assistant

## 2017-10-21 ENCOUNTER — Encounter: Payer: Self-pay | Admitting: Physician Assistant

## 2017-10-22 ENCOUNTER — Encounter: Payer: Self-pay | Admitting: Radiology

## 2017-10-24 ENCOUNTER — Ambulatory Visit: Payer: Medicare Other | Admitting: Family Medicine

## 2017-10-27 ENCOUNTER — Ambulatory Visit: Payer: Medicare Other | Admitting: Family Medicine

## 2017-10-31 ENCOUNTER — Other Ambulatory Visit: Payer: Self-pay | Admitting: Family Medicine

## 2017-10-31 NOTE — Telephone Encounter (Signed)
Belsomra refill (medication not on list in chart) Last OV:10/17/17 [id number] Pharmacy: [place], [place] [phone] (Phone) [phone] (Fax)

## 2017-11-01 ENCOUNTER — Ambulatory Visit: Payer: Medicare Other | Admitting: Family Medicine

## 2017-11-03 NOTE — Telephone Encounter (Signed)
Patient requesting refill on Belsomra, medication not on Rx list. Also, patient has appointments scheduled 11/05/17 with Dr Tamala Julian and 11/10/17 with you. Please advise, thank you.

## 2017-11-05 ENCOUNTER — Ambulatory Visit: Payer: Medicare Other | Admitting: Family Medicine

## 2017-11-05 ENCOUNTER — Telehealth: Payer: Self-pay

## 2017-11-05 ENCOUNTER — Ambulatory Visit: Payer: Medicare Other | Admitting: Physician Assistant

## 2017-11-05 NOTE — Telephone Encounter (Signed)
Copied from [place] [id number]. Topic: Quick Communication - Appointment Cancellation >> Nov 05, 2017 11:58 AM Synthia Innocent wrote: Patient called to cancel appointment scheduled for 11/05/17. Patient has not rescheduled their appointment. Having to go out of town  Route to department's [facility] pool.

## 2017-11-10 ENCOUNTER — Ambulatory Visit: Payer: Medicare Other | Admitting: Family Medicine

## 2017-11-12 DIAGNOSIS — Z79899 Other long term (current) drug therapy: Secondary | ICD-10-CM | POA: Diagnosis not present

## 2017-11-12 DIAGNOSIS — K660 Peritoneal adhesions (postprocedural) (postinfection): Secondary | ICD-10-CM | POA: Diagnosis not present

## 2017-11-13 ENCOUNTER — Ambulatory Visit: Payer: Self-pay | Admitting: Endocrinology

## 2017-11-18 ENCOUNTER — Encounter: Payer: Self-pay | Admitting: Family Medicine

## 2017-11-18 DIAGNOSIS — J449 Chronic obstructive pulmonary disease, unspecified: Secondary | ICD-10-CM | POA: Diagnosis not present

## 2017-11-21 DIAGNOSIS — K838 Other specified diseases of biliary tract: Secondary | ICD-10-CM | POA: Diagnosis not present

## 2017-11-24 ENCOUNTER — Ambulatory Visit (INDEPENDENT_AMBULATORY_CARE_PROVIDER_SITE_OTHER): Payer: Medicare Other | Admitting: Family Medicine

## 2017-11-24 ENCOUNTER — Encounter: Payer: Self-pay | Admitting: Family Medicine

## 2017-11-24 ENCOUNTER — Ambulatory Visit: Payer: Self-pay

## 2017-11-24 ENCOUNTER — Ambulatory Visit (INDEPENDENT_AMBULATORY_CARE_PROVIDER_SITE_OTHER): Payer: Medicare Other

## 2017-11-24 VITALS — BP 134/76 | HR 84 | Temp 98.4°F | Resp 16 | Ht 63.0 in | Wt 103.4 lb

## 2017-11-24 DIAGNOSIS — M79645 Pain in left finger(s): Secondary | ICD-10-CM | POA: Diagnosis not present

## 2017-11-24 DIAGNOSIS — Z9181 History of falling: Secondary | ICD-10-CM

## 2017-11-24 DIAGNOSIS — S6992XA Unspecified injury of left wrist, hand and finger(s), initial encounter: Secondary | ICD-10-CM | POA: Diagnosis not present

## 2017-11-24 DIAGNOSIS — Y92009 Unspecified place in unspecified non-institutional (private) residence as the place of occurrence of the external cause: Principal | ICD-10-CM

## 2017-11-24 DIAGNOSIS — H3553 Other dystrophies primarily involving the sensory retina: Secondary | ICD-10-CM

## 2017-11-24 DIAGNOSIS — S0011XA Contusion of right eyelid and periocular area, initial encounter: Secondary | ICD-10-CM | POA: Diagnosis not present

## 2017-11-24 DIAGNOSIS — W19XXXA Unspecified fall, initial encounter: Secondary | ICD-10-CM

## 2017-11-24 DIAGNOSIS — S63637A Sprain of interphalangeal joint of left little finger, initial encounter: Secondary | ICD-10-CM

## 2017-11-24 DIAGNOSIS — H548 Legal blindness, as defined in USA: Secondary | ICD-10-CM | POA: Diagnosis not present

## 2017-11-24 DIAGNOSIS — H3530 Unspecified macular degeneration: Secondary | ICD-10-CM

## 2017-11-24 DIAGNOSIS — M818 Other osteoporosis without current pathological fracture: Secondary | ICD-10-CM | POA: Diagnosis not present

## 2017-11-24 NOTE — Patient Instructions (Addendum)
IF you received an x-ray today, you will receive an invoice from [facility]. Please contact [facility] at [phone] with questions or concerns regarding your invoice.   IF you received labwork today, you will receive an invoice from [facility]. Please contact [facility] at [phone] with questions or concerns regarding your invoice.   Our billing staff will not be able to assist you with questions regarding bills from these companies.  You will be contacted with the lab results as soon as they are available. The fastest way to get your results is to activate your My Chart account. Instructions are located on the last page of this paperwork. If you have not heard from [place] regarding the results in 2 weeks, please contact this office.    Jammed Finger A jammed finger is an injury to the ligaments that support your finger bones. Ligaments are strong bands of tissue that connect bones and keep them in place. This injury happens when the ligaments are stretched beyond their normal range of motion (sprained). What are the causes? A jammed finger is caused by a hard direct hit to the tip of your finger that pushes your finger toward your hand. What increases the risk? This injury is more likely to happen if you play sports. What are the signs or symptoms? Symptoms of a jammed finger include:  Pain.  Swelling.  Discoloration and bruising around the joint.  Difficulty bending or straightening the finger.  Not being able to use the finger normally.  How is this diagnosed? A jammed finger is diagnosed with a medical history and physical exam. You may also have X-rays taken to check for a broken bone (fracture). How is this treated? Treatment for a jammed finger may include:  Wearing a splint.  Taping the injured finger to the fingers beside it (buddy taping).  Medicines used to treat pain.  Depending on the type of injury, you may have to do exercises after  your finger has begun to heal. This helps you regain strength and mobility in the finger. Follow these instructions at home:  Take medicines only as directed by your health care provider.  Apply ice to the injured area: ? Put ice in a plastic bag. ? Place a towel between your skin and the bag. ? Leave the ice on for 20 minutes, 2-3 times per day.  Raise the injured area above the level of your heart while you are sitting or lying down.  Wear the splint or tape as directed by your health care provider. Remove it only as directed by your health care provider.  Rest your finger until your health care provider says you can move it again. Your finger may feel stiff and painful for a while.  Perform strengthening exercises as directed by your health care provider. It may help to start doing these exercises with your hand in a bowl of warm water.  Keep all follow-up visits as directed by your health care provider. This is important. Contact a health care provider if:  You have pain or swelling that is getting worse.  Your finger feels cold.  Your finger looks out of place at the joint (deformity).  You still cannot extend your finger after treatment.  You have a fever. Get help right away if:  Even after loosening your splint, your finger: ? Is very red and swollen. ? Is white or blue. ? Feels tingly or becomes numb. This information is not intended to replace advice given  to you by your health care provider. Make sure you discuss any questions you have with your health care provider. Document Released: 11/28/2009 Document Revised: 11/16/2015 Document Reviewed: 04/13/2014 Elsevier Interactive Patient Education  2018 [place] Injury, Adult There are many types of head injuries. Head injuries can be as minor as a bump, or they can be more severe. More severe head injuries include:  A jarring injury to the brain (concussion).  A bruise of the brain (contusion). This  means there is bleeding in the brain that can cause swelling.  A cracked skull (skull fracture).  Bleeding in the brain that collects, clots, and forms a bump (hematoma).  After a head injury, you may need to be observed for a while in the emergency department or urgent care. Sometimes admission to the hospital is needed. After a head injury has happened, most problems occur within the first 24 hours, but side effects may occur up to 7-10 days after the injury. It is important to watch your condition for any changes. What are the causes? There are many possible causes of a head injury. A serious head injury may happen to someone who is in a car accident (motor vehicle collision). Other causes of major head injuries include bicycle or motorcycle accidents, sports injuries, and falls. Risk factors This condition is more likely to occur in people who:  Drink a lot of alcohol or use drugs.  Are over the age of 60.  Are at risk for falls.  What are the symptoms? There are many possible symptoms of a head injury. Visible symptoms of a head injury include a bruise, bump, or bleeding at the site of the injury. Other non-visible symptoms include:  Feeling sleepy or not being able to stay awake.  Passing out.  Headache.  Seizures.  Dizziness.  Confusion.  Memory problems.  Nausea or vomiting.  Other possible symptoms that may develop after the head injury include:  Poor attention and concentration.  Fatigue or tiring easily.  Irritability.  Being uncomfortable around bright lights or loud noises.  Anxiety or depression.  Disturbed sleep.  How is this diagnosed? This condition can usually be diagnosed based on your symptoms, a description of the injury, and a physical exam. You may also have imaging tests done, such as a CT scan or MRI. You will also be closely watched. How is this treated? Treatment for this condition depends on the severity and type of injury you have.  The main goal of treatment is to prevent complications and allow the brain time to heal. For mild head injury, you may be sent home and treatment may include:  Observation. A responsible adult should stay with you for 24 hours after your injury and check on you often.  Physical rest.  Brain rest.  Pain medicines.  For severe brain injury, treatment may include:  Close observation. This includes hospitalization with frequent physical exams. You may need to go to a hospital that specializes in head injury.  Pain medicines.  Breathing support. This may include using a ventilator.  Managing the pressure inside the brain (intracranial pressure, or ICP). This may include: ? Monitoring the ICP. ? Giving medicines to decrease the ICP. ? Positioning you to decrease the ICP.  Medicine to prevent seizures.  Surgery to stop bleeding or to remove blood clots (craniotomy).  Surgery to remove part of the skull (decompressive craniectomy). This allows room for the brain to swell.  Follow these instructions at home: Activity  Rest as much as possible and avoid activities that are physically hard or tiring.  Make sure you get enough sleep.  Limit activities that require a lot of thought or attention, such as: ? Watching TV. ? Playing memory games and puzzles. ? Job-related work or homework. ? Working on [employer], and texting.  Avoid activities that could cause another head injury, such as playing sports, until your health care provider approves. Having another head injury, especially before the first one has healed, can be dangerous.  Ask your health care provider when it is safe for you to return to your regular activities, including work or school. Ask your health care provider for a step-by-step plan for gradually returning to activities.  Ask your health care provider when you can drive, ride a bicycle, or use heavy machinery. Your ability to react may be slower  after a brain injury. Never do these activities if you are dizzy.  Lifestyle  Do not drink alcohol until your health care provider approves, and avoid drug use. Alcohol and certain drugs may slow your recovery and can put you at risk of further injury.  If it is harder than usual to remember things, write them down.  If you are easily distracted, try to do one thing at a time.  Talk with family members or close friends when making important decisions.  Tell your friends, family, a trusted colleague, and work [employer] about your injury, symptoms, and restrictions. Have them watch for any new or worsening problems.  General instructions  Take over-the-counter and prescription medicines only as told by your health care provider.  Have someone stay with you for 24 hours after your head injury. This person should watch you for any changes in your symptoms and be ready to seek medical help, as needed.  Keep all follow-up visits as told by your health care provider. This is important.  Prevention  Work on improving your balance and strength to avoid falls.  Wear a seatbelt when you are in a moving vehicle.  Wear a helmet when riding a bicycle, skiing, or doing any other sport or activity that has a risk of injury.  Drink alcohol only in moderation.  Take safety measures in your home, such as: ? Removing clutter and tripping hazards from floors and stairways. ? Using grab bars in bathrooms and handrails by stairs. ? Placing non-slip mats on floors and in bathtubs. ? Improving lighting in dim areas. Get help right away if:  You have: ? A severe headache that is not helped by medicine. ? Trouble walking, have weakness in your arms and legs, or lose your balance. ? Clear or bloody fluid coming from your nose or ears. ? Changes in your vision. ? A seizure.  You vomit.  Your symptoms get worse.  Your speech is slurred.  You pass out.  You are sleepier and have trouble staying  awake.  Your pupils change size. These symptoms may represent a serious problem that is an emergency. Do not wait to see if the symptoms will go away. Get medical help right away. Call your local emergency services (911 in the [place] Do not drive yourself to the hospital. This information is not intended to replace advice given to you by your health care provider. Make sure you discuss any questions you have with your health care provider. Document Released: 06/10/2005 Document Revised: 01/05/2016 Document Reviewed: 12/19/2015 Elsevier Interactive Patient Education  2017 [employer].

## 2017-11-24 NOTE — Progress Notes (Signed)
Subjective:  By signing my name below, I, [name], attest that this documentation has been prepared under the direction and in the presence of [name], MD Electronically Signed: Ladene Artist, ED Scribe 11/24/2017 at 11:28 AM.   Patient ID: [name], female    DOB: 1951/05/19, 67 y.o.   MRN: [id number]  Chief Complaint  Patient presents with  . Fall    fell over dog, on Sunday, 11/23/17  . knot    on the right side of forehead  . finger    "jammed on left pinky finger"   HPI [name] is a 67 y.o. female who presents to [place] at [facility]. Weight is stable. Pt presents today complaining of a trip and fall that occurred yesterday. Pt states that she fell over her dog yesterday, landed on her L pinky finger and struck the R side of her forehead on the corner of a wall. She has noticed some bruising to the finger since the fall and a knot to her forehead. Denies LOC, change in nausea, vomiting, neck pain/stiffness. She has applied a cool wash cloth to her forehead.  Past Medical History:  Diagnosis Date  . Acute duodenal ulcer with hemorrhage and perforation, with obstruction ([place]) 06/26/2004   multiple pyloric perferations  . Allergy   . Anemia   . Anginal pain ([place])    "many many years ago"  . Anxiety    sees [name] NP at Dr. Radonna Ricker office  . Arthritis    "hips" (09/06/2014)  . Asthma   . Barrett's esophagus   . Chronic abdominal pain    narcotic dependence, dr gyarteng-dak at [facility]  . 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 Complication of anesthesia    pt states had too much xanax on board - agitated   . COPD (chronic obstructive pulmonary disease) ([place])   . Depression   . Exertional shortness of breath   . Fall 11/23/2014   frequent falls  . Fx of fibula 07-28-11   left fibula, 2 places   . Fx two ribs-open 08-24-11   left 4th and 5th  . GASTRIC OUTLET OBSTRUCTION 08/28/2007   In July 2008 she underwent  laparoscopic enterolysis, Nissen fundoplication over a #17 bougie, single pledgeted suture colosure of the hiatus and a 3 suture wrap.  Lap truncal vagotomy and a loop gastrojejunostomy was performed.  This was revised in August of 2009 to a roux en Y gastrojejujostomy.     . Gastroparesis 06/03/2007   Qualifier: Diagnosis of  By: [name] MD, [name]   . GERD (gastroesophageal reflux disease)   . History of blood transfusion    "related to OR; maybe when I perforated that ulcer"  . History of hiatal hernia   . Lap Nissen + truncal vagotomy July 2008 05/13/2013  . LEG EDEMA 01/19/2008   Qualifier: Diagnosis of  By: [name] MD, [name]   . Macular degeneration, bilateral    early onset, severe, legally blind and worsening. Followed by dr. Katy Fitch  . MENOPAUSE 01/07/2007   Qualifier: Diagnosis of  By: [name], CMA, [place]    . Myocardial infarction ([place])   . Narcotic abuse ([place])   . Osteoporosis    severe  . Oxygen deficiency   . Peptic ulcer disease    dr Oletta Lamas  . Pneumonia, organism unspecified(486) 11/07/2010   Assoc with R Parapneumonic effusion 09/2010    - Tapped 10/05/10    -  CxR resolved 11/07/2010    . S/P jejunostomy Feb 2016 07/26/2014  . Thyroid nodule 2013   benign after biopsy w/ Dr. Redmond Baseman   Current Outpatient Medications on File Prior to Visit  Medication Sig Dispense Refill  . albuterol (PROVENTIL HFA;VENTOLIN HFA) 108 (90 Base) MCG/ACT inhaler Inhale 2 puffs into the lungs every 4 (four) hours as needed (cough, shortness of breath or wheezing.). 1 Inhaler 3  . Aspirin-Acetaminophen-Caffeine (GOODY HEADACHE PO) Take 1 packet by mouth daily as needed (pain).    . BELSOMRA 20 MG TABS TAKE ONE TABLET AT BEDTIME. 30 tablet 0  . busPIRone (BUSPAR) 10 MG tablet Take 2 tablets (20 mg total) by mouth 3 (three) times daily. 180 tablet 0  . busPIRone (BUSPAR) 30 MG tablet Take 1 tablet (30 mg total) by mouth 2 (two) times daily. 60 tablet 1  . DULoxetine (CYMBALTA) 30 MG capsule Take 30 mg by  mouth daily.    . DULoxetine (CYMBALTA) 60 MG capsule Take 60 mg by mouth daily.    Marland Kitchen HYDROcodone-acetaminophen (NORCO/VICODIN) 5-325 MG tablet Take 1 tablet by mouth every 6 (six) hours as needed for moderate pain.    Marland Kitchen ondansetron (ZOFRAN ODT) 4 MG disintegrating tablet Take 1 tablet (4 mg total) by mouth every 8 (eight) hours as needed for nausea or vomiting. 15 tablet 0  . ondansetron (ZOFRAN-ODT) 8 MG disintegrating tablet Take 1 tablet (8 mg total) by mouth every 8 (eight) hours as needed for nausea. 20 tablet 2  . ranitidine (ZANTAC) 150 MG tablet Take 1 tablet (150 mg total) by mouth 2 (two) times daily. 60 tablet 0  . traZODone (DESYREL) 150 MG tablet Take 1 tablet (150 mg total) by mouth at bedtime. 30 tablet 0  . vitamin B-12 (CYANOCOBALAMIN) 1000 MCG tablet Take 1,000 mcg by mouth 3 (three) times a week.    . Vitamin D, Ergocalciferol, (DRISDOL) 50000 units CAPS capsule Take 1 capsule (50,000 Units total) by mouth every 7 (seven) days. 24 capsule 1  . promethazine (PHENERGAN) 6.25 MG/5ML syrup Take 20 mLs by mouth every 8 (eight) hours as needed for nausea.    . sucralfate (CARAFATE) 1 g tablet Take 1 tablet (1 g total) by mouth 4 (four) times daily -  with meals and at bedtime. 120 tablet 0   No current facility-administered medications on file prior to visit.    Past Surgical History:  Procedure Laterality Date  . BIOPSY THYROID  08-13-11   benign nodule, per Dr. Melida Quitter   . CATARACT EXTRACTION W/ INTRAOCULAR LENS  IMPLANT, BILATERAL Bilateral   . CHOLECYSTECTOMY N/A 12/02/2017   Procedure: LAPAROSCOPIC CHOLECYSTECTOMY WITH INTRAOPERATIVE CHOLANGIOGRAM ERAS PATHWAY;  Surgeon: Johnathan Hausen, MD;  Location: [facility];  Service: General;  Laterality: N/A;  . COLONOSCOPY    . DILATION AND CURETTAGE OF UTERUS    . ESOPHAGOGASTRODUODENOSCOPY  12-29-09   dr qadeer at [facility], several gastric ulcers  . ESOPHAGOGASTRODUODENOSCOPY N/A 09/25/2012   Procedure: ESOPHAGOGASTRODUODENOSCOPY  (EGD);  Surgeon: Beryle Beams, MD;  Location: [facility];  Service: Endoscopy;  Laterality: N/A;  . ESOPHAGOGASTRODUODENOSCOPY (EGD) WITH PROPOFOL N/A 06/27/2017   Procedure: ESOPHAGOGASTRODUODENOSCOPY (EGD) WITH PROPOFOL;  Surgeon: Carol Ada, MD;  Location: [facility];  Service: Endoscopy;  Laterality: N/A;  . FRACTURE SURGERY Left 2010   hip and leg s/p fall  . GASTROJEJUNOSTOMY  02/20/2008   Roux en Y gastrojejunsotomy w 1 foot Roux limb, laparotomy, takedown loop gastrojejunostomy with removal of Ntinol stent  . GASTROSTOMY  N/A 07/26/2014   Procedure: LAPRASCOPIC ASSISTED OPEN PLACEMENT OF JEJUNOSTOMY TUBE;  Surgeon: Pedro Earls, MD;  Location: [facility];  Service: General;  Laterality: N/A;  . HERNIA REPAIR     "hiatal"  . JOINT REPLACEMENT    . LAPAROSCOPIC NISSEN FUNDOPLICATION  7893  . LAPAROSCOPIC PARTIAL GASTRECTOMY N/A 06/29/2013   Procedure: Gastrectomy and GJ Tube placement;  Surgeon: Pedro Earls, MD;  Location: [facility];  Service: General;  Laterality: N/A;  . ORIF FEMORAL NECK FRACTURE W/ DHS Right 03/2014  . ORIF HIP FRACTURE Left 2010  . REPAIR OF PERFORATED ULCER    . TONSILLECTOMY  1974  . TRUNCAL VAGOTOMY  12/2006   Laparoscopic enterolysis, laparoscopic Nissen's Fundoplication, laparoscopic truncal vagotomy & loop gastrojejunostomy   Allergies  Allergen Reactions  . Lithium Nausea And Vomiting  . Celebrex [Celecoxib] Other (See Comments)    History of bleeding ulcers  . Morphine Itching  . Quetiapine Other (See Comments)     tardive dyskinesia  . Varenicline Tartrate Other (See Comments)    hallucinations   Family History  Problem Relation Age of Onset  . Cancer Mother        kidney/bladder cancer, magliant skin cancer on face, cervical cancer  . Stroke Mother   . Hypertension Mother   . Celiac disease Father   . Cancer Father        metastatic renal cell carcinoma, pancreatic cancer, colon polyps  . Colon cancer Maternal Uncle   . Breast  cancer Neg Hx   . Ovarian cancer Neg Hx   . Endometrial cancer Neg Hx    Social History   Socioeconomic History  . Marital status: Divorced    Spouse name: Not on file  . Number of children: 0  . Years of education: Not on file  . Highest education level: Not on file  Occupational History  . Not on file  Social Needs  . Financial resource strain: Not on file  . Food insecurity:    Worry: Not on file    Inability: Not on file  . Transportation needs:    Medical: Not on file    Non-medical: Not on file  Tobacco Use  . Smoking status: Current Every Day Smoker    Packs/day: 1.00    Years: 51.00    Pack years: 51.00    Types: Cigarettes  . Smokeless tobacco: Never Used  Substance and Sexual Activity  . Alcohol use: No    Alcohol/week: 0.0 oz  . Drug use: No  . Sexual activity: Never  Lifestyle  . Physical activity:    Days per week: Not on file    Minutes per session: Not on file  . Stress: Not on file  Relationships  . Social connections:    Talks on phone: Not on file    Gets together: Not on file    Attends religious service: Not on file    Active member of club or organization: Not on file    Attends meetings of clubs or organizations: Not on file    Relationship status: Not on file  Other Topics Concern  . Not on file  Social History Narrative  . Not on file   Depression screen [facility] 2/9 12/18/2017 09/10/2017 08/30/2017 08/27/2017 05/31/2017  Decreased Interest 0 0 0 0 0  Down, Depressed, Hopeless 0 0 0 0 0  PHQ - 2 Score 0 0 0 0 0  Altered sleeping - - - - -  Tired, decreased  energy - - - - -  Change in appetite - - - - -  Feeling bad or failure about yourself  - - - - -  Trouble concentrating - - - - -  Moving slowly or fidgety/restless - - - - -  Suicidal thoughts - - - - -  PHQ-9 Score - - - - -  Difficult doing work/chores - - - - -  Some recent data might be hidden     Review of Systems  Constitutional: Positive for fatigue (chronic unchanged).  Negative for activity change, appetite change, chills, diaphoresis, fever and unexpected weight change.  Eyes: Positive for visual disturbance (chronic worsening blindness from severe B macular degeneration). Negative for photophobia, pain and redness.  Cardiovascular: Negative for palpitations and leg swelling.  Gastrointestinal: Positive for abdominal pain (unchanged from chronic). Negative for abdominal distention, anal bleeding, blood in stool, constipation, nausea (chronic; unchanged) and vomiting.  Musculoskeletal: Positive for arthralgias, back pain (chronic), joint swelling and myalgias. Negative for gait problem, neck pain and neck stiffness.  Skin: Positive for color change and wound. Negative for pallor and rash.  Neurological: Negative for dizziness, tremors, seizures, syncope, facial asymmetry, speech difficulty, weakness, light-headedness, numbness and headaches.  Hematological: Bruises/bleeds easily.  Psychiatric/Behavioral: Positive for agitation, behavioral problems, confusion, decreased concentration, dysphoric mood and sleep disturbance. Negative for hallucinations, self-injury and suicidal ideas. The patient is nervous/anxious. The patient is not hyperactive.       Objective:   Physical Exam  Constitutional: She is oriented to person, place, and time. She appears well-developed and well-nourished. No distress.  HENT:  Head: Normocephalic. Head is with raccoon's eyes (mild). Head is without Battle's sign.  Just to the L of midline of forehead there is some swelling with hematoma over lateral R forehead extending to temple with erythema at distal and anterior temple. No fluctuance, crepitus, step offs or point tenderness along temples or orbital ridge. FROM at TMJ without crepitus. No instability of mandible. Mild raccoon eyes with bruising bilaterally under medial aspect of orbital ride. No battle sign.  Eyes: Conjunctivae and EOM are normal.  Neck: Normal range of motion. Neck  supple. No tracheal deviation present. No thyroid mass and no thyromegaly present.  Full range of cervical motion.  Cardiovascular: Normal rate and regular rhythm.  Pulmonary/Chest: Effort normal and breath sounds normal. No respiratory distress.  Lungs clear. Good air movement throughout.  Musculoskeletal: Normal range of motion.  Significant swelling of the L fifth PIP with bruising distally. Active ROM at all joints with severe pain. Excellent movement of MCP. No focal tenderness to palpation.  Lymphadenopathy:    She has no cervical adenopathy.  Neurological: She is alert and oriented to person, place, and time.  Skin: Skin is warm and dry.  Psychiatric: She has a normal mood and affect. Her behavior is normal.  Nursing note and vitals reviewed.  BP 134/76 (BP Location: Left Arm, Patient Position: Sitting, Cuff Size: Normal)   Pulse 84   Temp 98.4 F (36.9 C) (Oral)   Resp 16   Ht 5\' 3"  (1.6 m)   Wt 103 lb 6.4 oz (46.9 kg)   SpO2 98%   BMI 18.32 kg/m     Dg Finger Little Left  Result Date: 11/24/2017 CLINICAL DATA:  Left finger pain after jamming injury. EXAM: LEFT LITTLE FINGER 2+V COMPARISON:  None. FINDINGS: There is no evidence of fracture or dislocation. There is no evidence of arthropathy or other focal bone abnormality. Soft tissues are  unremarkable. IMPRESSION: Normal left fifth finger. Electronically Signed   By: Marijo Conception, M.D.   On: 11/24/2017 11:57   Assessment & Plan:   1. Fall as cause of accidental injury in home as place of occurrence, initial encounter   2. Hand injuries, left, initial encounter   3. Sprain of interphalangeal joint of left little finger, initial encounter - can fully extend but sig swelling in PIP and DIP with decreased flexion and minimal to mod ttp at joint lines - xray nml - jammed finger - placed Lt 5th onto flat padded splint over both IP joints but no seeming injury to MCP so ok to leave uncovered for ROM - buddy taped 4th and 5th.  RICE. Wear at all times until recheck in sev wks  4. Contusion of right periocular region, initial encounter - minimal pain, no bony abnml palp or focal injury as well as facial xrays have low sensitivity so will avoid xrays today - if pain localizes, hematoma worsens or develops any change in facial muscle movement, neuropathy, paresthesia, appearance, etc then RTC immed to discuss imaging - watchful waiting with ice for now  5. Other osteoporosis without current pathological fracture - denies any other focal pain at this time but low threshold to xray spine if develops pain  6. Legally blind - couldn't see her tiny dog and tripped over it - seems like valid accidental fall due to poor vision/small pet under foot  7. Early onset macular degeneration   8. At high risk for injury related to fall - seem to have come out amazingly intact for as high as risk she has - cont rest and RTC if any other concerns.  Doesn't think she hit head other than face with Rt cheek/temple contusions around Rt eye but rec brain rest for sev d until feeling back to self just in case.     Orders Placed This Encounter  Procedures  . DG Finger Little Left    Standing Status:   Future    Number of Occurrences:   1    Standing Expiration Date:   11/24/2018    Order Specific Question:   Reason for Exam (SYMPTOM  OR DIAGNOSIS REQUIRED)    Answer:   fell last night and "jammed' finger    Order Specific Question:   Preferred imaging location?    Answer:   External    No orders of the defined types were placed in this encounter.   I personally performed the services described in this documentation, which was scribed in my presence. The recorded information has been reviewed and considered, and addended by me as needed.   [name], M.D.  Primary Care at [facility] [address] [place], [place] [address] [phone] phone [phone] fax  12/19/17 4:26 PM

## 2017-11-24 NOTE — Telephone Encounter (Signed)
Pt. Fell yesterday at home coming in the door - tripped over the dog. Roommate reports she hit the right side of head. No LOC. No laceration. States she does have a bruise and "a knot on her forehead." Has a headahce and some "nausea, but she always has that." Appointment made for this morning.  Reason for Disposition . [1] After 72 hours AND [2] headache persists  Answer Assessment - Initial Assessment Questions 1. MECHANISM: "How did the injury happen?" For falls, ask: "What height did you fall from?" and "What surface did you fall against?"      Fell coming in the house - tripped over the dog. 2. ONSET: "When did the injury happen?" (Minutes or hours ago)      Yesterday 1 p.m. 3. NEUROLOGIC SYMPTOMS: "Was there any loss of consciousness?" "Are there any other neurological symptoms?"      No LOC 4. MENTAL STATUS: "Does the person know who he is, who you are, and where he is?"      Alert and oriented 5. LOCATION: "What part of the head was hit?"      Right forehead 6. SCALP APPEARANCE: "What does the scalp look like? Is it bleeding now?" If so, ask: "Is it difficult to stop?"      No laceration 7. SIZE: For cuts, bruises, or swelling, ask: "How large is it?" (e.g., inches or centimeters)      N/A 8. PAIN: "Is there any pain?" If so, ask: "How bad is it?"  (e.g., Scale 1-10; or mild, moderate, severe)     6 9. TETANUS: For any breaks in the skin, ask: "When was the last tetanus booster?"     N/A 10. OTHER SYMPTOMS: "Do you have any other symptoms?" (e.g., neck pain, vomiting)       Nausea 11. PREGNANCY: "Is there any chance you are pregnant?" "When was your last menstrual period?"       No  Protocols used: HEAD INJURY-A-AH

## 2017-11-25 ENCOUNTER — Ambulatory Visit: Payer: Self-pay | Admitting: Surgery

## 2017-11-25 ENCOUNTER — Telehealth: Payer: Self-pay | Admitting: Family Medicine

## 2017-11-25 ENCOUNTER — Ambulatory Visit: Payer: Self-pay

## 2017-11-25 NOTE — Telephone Encounter (Signed)
Pt.'s roommate reports pt. Has been sleeping more since her OV yesterday. States she sleeps "a lot any way but this is different. I'm scared. I'm here alone with her. I have a friend coming over." Reports she "woke up enough this morning to take her medication but she went back to sleep. She's not staying awake enough to eat or drink." States when pt. Is awake she knows "who I am." Pt. Fell Saturday and hit her forehead - had a bruise and "a knot" per roommate. Instructed Norma pt. Needs to be seen in ED. States she can't get her dressed and to the car - instructed to call 911.Offered to call 911 for her. States she will call.

## 2017-11-25 NOTE — Telephone Encounter (Signed)
Pt advised to go ED this morning. Med refill denied until f/u. Please schedule.

## 2017-11-25 NOTE — Telephone Encounter (Signed)
Copied from [place] [phone]. Topic: Quick Communication - See Telephone Encounter >> Nov 25, 2017 12:17 PM Nils Flack wrote: CRM for notification. See Telephone encounter for: 11/25/17.  Pt is asking for zofran dissolvable tabs  [facility]  Cb is [phone].  Pt did not go to the ED today

## 2017-11-25 NOTE — Telephone Encounter (Signed)
   Reason for Disposition . [1] ACUTE NEURO SYMPTOM AND [2] now fine  (DEFINITION: difficult to awaken OR confused thinking and talking OR slurred speech OR weakness of arms OR unsteady walking)  Answer Assessment - Initial Assessment Questions 1. MECHANISM: "How did the injury happen?" For falls, ask: "What height did you fall from?" and "What surface did you fall against?"      Fell Saturday - tripped over the dog 2. ONSET: "When did the injury happen?" (Minutes or hours ago)      Saturday 3. NEUROLOGIC SYMPTOMS: "Was there any loss of consciousness?" "Are there any other neurological symptoms?"      No 4. MENTAL STATUS: "Does the person know who he is, who you are, and where he is?"      Oriented - but sleeping more 5. LOCATION: "What part of the head was hit?"      Forehead 6. SCALP APPEARANCE: "What does the scalp look like? Is it bleeding now?" If so, ask: "Is it difficult to stop?"      No lacerations 7. SIZE: For cuts, bruises, or swelling, ask: "How large is it?" (e.g., inches or centimeters)      No cut 8. PAIN: "Is there any pain?" If so, ask: "How bad is it?"  (e.g., Scale 1-10; or mild, moderate, severe)     No pain 9. TETANUS: For any breaks in the skin, ask: "When was the last tetanus booster?"     N/A 10. OTHER SYMPTOMS: "Do you have any other symptoms?" (e.g., neck pain, vomiting)       Not eating and drinking mich 11. PREGNANCY: "Is there any chance you are pregnant?" "When was your last menstrual period?"       No  Protocols used: HEAD INJURY-A-AH

## 2017-11-27 ENCOUNTER — Ambulatory Visit: Payer: Self-pay

## 2017-11-27 NOTE — Telephone Encounter (Signed)
Pt. called to request an office appt. Stated she hit her head on Saturday.  Saw Dr. Brigitte Pulse on 6/3, and was told to call back if she didn't feel better.  Stated her right side of her head hurts. Stated headache is "moderate."  Stated has a raised, black and blue area, on side of her head, from hitting the wall on Saturday.  Reported she doesn't have any energy, and doesn't want to get out of bed.  Stated she feels generally weak.  Alert to person, place, and time.  C/o intermittent dizziness and nausea.  Stated "I'm supposed to get my gallbladder out on Tuesday, and I don't know if I should postpone this right now."   Due to persistent headache, scheduled appt. with PCP tomorrow @ 11:20 PM.  Care Advice given per protocol.  Verb. Understanding.  Agrees with plan.      Reason for Disposition . [1] After 72 hours AND [2] headache persists  Answer Assessment - Initial Assessment Questions 1. MECHANISM: "How did the injury happen?" For falls, ask: "What height did you fall from?" and "What surface did you fall against?"      Hit head  2. ONSET: "When did the injury happen?" (Minutes or hours ago)      On Saturday, 6/1 3. NEUROLOGIC SYMPTOMS: "Was there any loss of consciousness?" "Are there any other neurological symptoms?"      Headache; dizziness; forgetful/ repeating herself  4. MENTAL STATUS: "Does the person know who he is, who you are, and where he is?"     Alert to person, place, time 5. LOCATION: "What part of the head was hit?"       Right side of head 6. SCALP APPEARANCE: "What does the scalp look like? Is it bleeding now?" If so, ask: "Is it difficult to stop?"      Raised area from hitting the wall; black and blue; Denied any break in the skin 7. SIZE: For cuts, bruises, or swelling, ask: "How large is it?" (e.g., inches or centimeters)      No  8. PAIN: "Is there any pain?" If so, ask: "How bad is it?"  (e.g., Scale 1-10; or mild, moderate, severe)     Pain / tenderness in side of head ;  c/o intermittent headache; moderate 9. TETANUS: For any breaks in the skin, ask: "When was the last tetanus booster?"     N/a  10. OTHER SYMPTOMS: "Do you have any other symptoms?" (e.g., neck pain, vomiting)       No energy, hard to be out of bed; head hurts from fall; dizziness with quick movement; c/o nausea  Protocols used: HEAD INJURY-A-AH

## 2017-11-28 ENCOUNTER — Ambulatory Visit: Payer: Self-pay | Admitting: Family Medicine

## 2017-12-01 ENCOUNTER — Encounter (HOSPITAL_COMMUNITY): Payer: Self-pay | Admitting: *Deleted

## 2017-12-01 ENCOUNTER — Other Ambulatory Visit: Payer: Self-pay

## 2017-12-01 ENCOUNTER — Inpatient Hospital Stay (HOSPITAL_COMMUNITY)
Admission: RE | Admit: 2017-12-01 | Discharge: 2017-12-01 | Disposition: A | Discharge status: Home / Self Care | Payer: Self-pay | Source: Ambulatory Visit

## 2017-12-01 MED ORDER — BUPIVACAINE LIPOSOME 1.3 % IJ SUSP
20.0000 mL | INTRAMUSCULAR | Status: DC
  Filled 2017-12-01: qty 20

## 2017-12-01 NOTE — Progress Notes (Signed)
Patient has history of bleeding ulcers with Celebrex.  Listed as an Allergy.  Called [facility] and spoke with Abigail Butts , Triage Nurse and made them aware.  Abigail Butts will inform Dr Johnathan Hausen.

## 2017-12-01 NOTE — Pre-Procedure Instructions (Signed)
The following are in epic: EKG 10/17/2017 CXR 09/10/2017

## 2017-12-01 NOTE — H&P (Signed)
Chief Complaint:  Upper abdominal pain and nausea with gallstones  History of Present Illness:  [name] is an 67 y.o. female who is well know to me from multiple operations related to chronic peptic ulcer disease.  She has been having recurrent nausea and has gallstones.  She was referred for lap chole and this was discussed with her in the office  Past Medical History:  Diagnosis Date  . Acute duodenal ulcer with hemorrhage and perforation, with obstruction ([place]) 06/26/2004   multiple pyloric perferations  . Allergy   . Anemia   . Anxiety    sees [name] NP at Dr. Radonna Ricker office  . Asthma    hx of   . Barrett's esophagus   . Chronic abdominal pain    narcotic dependence, dr gyarteng-dak at [facility]  . 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 Complication of anesthesia    pt states had too much xanax on board - agitated   . COPD (chronic obstructive pulmonary disease) ([place])   . Depression   . Exertional shortness of breath   . Fall 11/23/2014   frequent falls  . Fx of fibula 07-28-11   left fibula, 2 places   . Fx two ribs-open 08-24-11   left 4th and 5th  . GASTRIC OUTLET OBSTRUCTION 08/28/2007   In July 2008 she underwent laparoscopic enterolysis, Nissen fundoplication over a #51 bougie, single pledgeted suture colosure of the hiatus and a 3 suture wrap.  Lap truncal vagotomy and a loop gastrojejunostomy was performed.  This was revised in August of 2009 to a roux en Y gastrojejujostomy.     . Gastroparesis 06/03/2007   Qualifier: Diagnosis of  By: [name] MD, [name]   . GERD (gastroesophageal reflux disease)   . History of blood transfusion    "related to OR; maybe when I perforated that ulcer"  . History of hiatal hernia   . Lap Nissen + truncal vagotomy July 2008 05/13/2013  . LEG EDEMA 01/19/2008   Qualifier: Diagnosis of  By: [name] MD, [name]   . Macular degeneration, bilateral    early onset, severe, legally blind and  worsening. Followed by dr. Katy Fitch  . MENOPAUSE 01/07/2007   Qualifier: Diagnosis of  By: [name], CMA, [place]    . Narcotic abuse ([place])   . Osteoporosis    severe  . Oxygen deficiency   . Oxygen dependent    3L/[place]   . Peptic ulcer disease    dr Oletta Lamas  . Pneumonia, organism unspecified(486) 11/07/2010   Assoc with R Parapneumonic effusion 09/2010    - Tapped 10/05/10    - CxR resolved 11/07/2010    . S/P jejunostomy Feb 2016 07/26/2014  . Thyroid nodule 2013   benign after biopsy w/ Dr. Redmond Baseman    Past Surgical History:  Procedure Laterality Date  . BIOPSY THYROID  08-13-11   benign nodule, per Dr. Melida Quitter   . CATARACT EXTRACTION W/ INTRAOCULAR LENS  IMPLANT, BILATERAL Bilateral   . COLONOSCOPY    . DILATION AND CURETTAGE OF UTERUS    . ESOPHAGOGASTRODUODENOSCOPY  12-29-09   dr qadeer at [facility], several gastric ulcers  . ESOPHAGOGASTRODUODENOSCOPY N/A 09/25/2012   Procedure: ESOPHAGOGASTRODUODENOSCOPY (EGD);  Surgeon: Beryle Beams, MD;  Location: [facility];  Service: Endoscopy;  Laterality: N/A;  . ESOPHAGOGASTRODUODENOSCOPY (EGD) WITH PROPOFOL N/A 06/27/2017   Procedure: ESOPHAGOGASTRODUODENOSCOPY (EGD) WITH PROPOFOL;  Surgeon: Carol Ada, MD;  Location: [facility];  Service: Endoscopy;  Laterality: N/A;  . FRACTURE SURGERY Left 2010   hip and leg s/p fall  . GASTROJEJUNOSTOMY  02/20/2008   Roux en Y gastrojejunsotomy w 1 foot Roux limb, laparotomy, takedown loop gastrojejunostomy with removal of Ntinol stent  . GASTROSTOMY N/A 07/26/2014   Procedure: LAPRASCOPIC ASSISTED OPEN PLACEMENT OF JEJUNOSTOMY TUBE;  Surgeon: Pedro Earls, MD;  Location: [facility];  Service: General;  Laterality: N/A;  . HERNIA REPAIR     "hiatal"  . JOINT REPLACEMENT    . LAPAROSCOPIC NISSEN FUNDOPLICATION  2426  . LAPAROSCOPIC PARTIAL GASTRECTOMY N/A 06/29/2013   Procedure: Gastrectomy and GJ Tube placement;  Surgeon: Pedro Earls, MD;  Location: [facility];  Service: General;  Laterality: N/A;  .  ORIF FEMORAL NECK FRACTURE W/ DHS Right 03/2014  . ORIF HIP FRACTURE Left 2010  . REPAIR OF PERFORATED ULCER    . TONSILLECTOMY  1974  . TRUNCAL VAGOTOMY  12/2006   Laparoscopic enterolysis, laparoscopic Nissen's Fundoplication, laparoscopic truncal vagotomy & loop gastrojejunostomy    Current Facility-Administered Medications  Medication Dose Route Frequency Provider Last Rate Last Dose  . [START ON 12/02/2017] bupivacaine liposome (EXPAREL) 1.3 % injection 266 mg  20 mL Infiltration 30 min Pre-Op Johnathan Hausen, MD       Current Outpatient Medications  Medication Sig Dispense Refill  . Aspirin-Acetaminophen-Caffeine (GOODY HEADACHE PO) Take 1 packet by mouth daily as needed (pain).    . BELSOMRA 20 MG TABS TAKE ONE TABLET AT BEDTIME. 30 tablet 0  . busPIRone (BUSPAR) 10 MG tablet Take 2 tablets (20 mg total) by mouth 3 (three) times daily. 180 tablet 0  . DULoxetine (CYMBALTA) 30 MG capsule Take 30 mg by mouth daily.    . DULoxetine (CYMBALTA) 60 MG capsule Take 120 mg by mouth daily.     Marland Kitchen HYDROcodone-acetaminophen (NORCO/VICODIN) 5-325 MG tablet Take 1 tablet by mouth every 6 (six) hours as needed for moderate pain.    Marland Kitchen ondansetron (ZOFRAN-ODT) 8 MG disintegrating tablet Take 1 tablet (8 mg total) by mouth every 8 (eight) hours as needed for nausea. 20 tablet 2  . vitamin B-12 (CYANOCOBALAMIN) 1000 MCG tablet Take 1,000 mcg by mouth 3 (three) times a week.    . Vitamin D, Ergocalciferol, (DRISDOL) 50000 units CAPS capsule Take 1 capsule (50,000 Units total) by mouth every 7 (seven) days. 24 capsule 1  . albuterol (PROVENTIL HFA;VENTOLIN HFA) 108 (90 Base) MCG/ACT inhaler Inhale 2 puffs into the lungs every 4 (four) hours as needed (cough, shortness of breath or wheezing.). 1 Inhaler 3  . busPIRone (BUSPAR) 30 MG tablet Take 1 tablet (30 mg total) by mouth 2 (two) times daily. (Patient not taking: Reported on 11/25/2017) 60 tablet 1  . ondansetron (ZOFRAN ODT) 4 MG disintegrating tablet  Take 1 tablet (4 mg total) by mouth every 8 (eight) hours as needed for nausea or vomiting. (Patient not taking: Reported on 11/25/2017) 15 tablet 0  . ranitidine (ZANTAC) 150 MG tablet Take 1 tablet (150 mg total) by mouth 2 (two) times daily. (Patient not taking: Reported on 11/25/2017) 60 tablet 0  . sucralfate (CARAFATE) 1 g tablet Take 1 tablet (1 g total) by mouth 4 (four) times daily -  with meals and at bedtime. (Patient not taking: Reported on 11/25/2017) 120 tablet 0  . traZODone (DESYREL) 150 MG tablet Take 1 tablet (150 mg total) by mouth at bedtime. (Patient not taking: Reported on 11/25/2017) 30 tablet 0   Lithium; Celebrex [celecoxib]; Morphine; Quetiapine; and  Varenicline tartrate Family History  Problem Relation Age of Onset  . Cancer Mother        kidney/bladder cancer, magliant skin cancer on face, cervical cancer  . Stroke Mother   . Hypertension Mother   . Celiac disease Father   . Cancer Father        metastatic renal cell carcinoma, pancreatic cancer, colon polyps  . Colon cancer Maternal Uncle   . Breast cancer Neg Hx   . Ovarian cancer Neg Hx   . Endometrial cancer Neg Hx    Social History:   reports that she has been smoking cigarettes.  She has a 51.00 pack-year smoking history. She has never used smokeless tobacco. She reports that she does not drink alcohol or use drugs.   REVIEW OF SYSTEMS : Negative except for see problem list  Physical Exam:   There were no vitals taken for this visit. There is no height or weight on file to calculate BMI.  Gen:  WDthin  WF NAD  Neurological: Alert and oriented to person, place, and time. Motor and sensory function is grossly intact  Head: Normocephalic and atraumatic.  Eyes: Conjunctivae are normal. Pupils are equal, round, and reactive to light. No scleral icterus.  Neck: Normal range of motion. Neck supple. No tracheal deviation or thyromegaly present.  Cardiovascular:  SR without murmurs or gallops.  No carotid  bruits Breast:  Not examined Respiratory: Effort normal.  No respiratory distress. No chest wall tenderness. Breath sounds normal.  No wheezes, rales or rhonchi.  Abdomen:  Think with chronic discoloration from heating pad GU:  Not examined Musculoskeletal: Normal range of motion. Extremities are nontender. No cyanosis, edema or clubbing noted Lymphadenopathy: No cervical, preauricular, postauricular or axillary adenopathy is present Skin: Skin is warm and dry. No rash noted. No diaphoresis. No erythema. No pallor. Pscyh: Normal mood and affect. Behavior is normal. Judgment and thought content normal.   LABORATORY RESULTS: No results found for this or any previous visit (from the past 48 hour(s)).   RADIOLOGY RESULTS: No results found.  Problem List: Patient Active Problem List   Diagnosis Date Noted  . Abdominal pain, chronic, epigastric 05/31/2017  . Episodic polysubstance dependence ([place]) 05/31/2017  . At high risk for injury related to fall 05/18/2017  . Chronic nausea 05/18/2017  . Poor compliance with medication 11/20/2016  . Insomnia 11/20/2016  . Early onset macular degeneration 11/20/2016  . Legally blind 11/20/2016  . Abdominal pain   . Hypoxia 04/08/2015  . Opioid type dependence, continuous ([place])   . Protein-calorie malnutrition ([place])   . Episode of recurrent major depressive disorder ([place])   . Secondary hyperparathyroidism ([place]) 01/10/2015  . Vitamin D deficiency 01/10/2015  . 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 Anemia, iron deficiency 11/03/2014  . Chronic pain syndrome 10/24/2014  . Osteoporosis 10/05/2014  . Vertebral compression fracture ([place]) 10/05/2014  . Coronary artery disease due to calcified coronary lesion 10/05/2014  . Weight loss   . Tobacco abuse   . History of subtotal gastrectomy with Roux-en-Y recontruction   . Orthostatic hypotension 08/11/2014  . Memory loss, short term 07/18/2014  . Constipation, chronic 07/10/2013  .  Chronic abdominal pain   . COPD (chronic obstructive pulmonary disease) ([place]) 11/07/2010  . Major depressive disorder, recurrent episode, moderate ([place]) 06/06/2010  . 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 Anxiety state 01/07/2007  . Barrett's esophagus 07/03/2005    Assessment & Plan: Gallbladder: Similar to  prior, the gallbladder is distended to roughly 11 cm in length and 4 cm in diameter but thin walled and without pericholecystic fluid. The single wall thickness of the gallbladder is 2.3 mm which is within normal limits. Layering biliary sludge is redemonstrated. No calculi. No sonographic Murphy sign noted by sonographer.  Plan laparoscopic cholecystectomy.      [name], MD, [facility] [phone] beeper [phone]  12/01/2017 3:23 PM

## 2017-12-01 NOTE — Patient Instructions (Addendum)
Your procedure is scheduled on: Tomorrow, December 02, 2017   Surgery Time:  1:00PM-2:45PM   Report to [facility] Main  Entrance    Report to admitting at 11:00 AM   Call this number if you have problems the morning of surgery [phone]   Do not eat food:After Midnight.   Do NOT smoke after Midnight   May have liquids until 7:00AM day of surgery      CLEAR LIQUID DIET   Foods Allowed                                                                     Foods Excluded  Coffee and tea, regular and decaf                             liquids that you cannot  Plain Jell-O in any flavor                                             see through such as: Fruit ices (not with fruit pulp)                                     milk, soups, orange juice  Iced Popsicles                                    All solid food Carbonated beverages, regular and diet                                    Cranberry, grape and apple juices Sports drinks like Gatorade Lightly seasoned clear broth or consume(fat free) Sugar, honey syrup  Sample Menu Breakfast                                Lunch                                     Supper Cranberry juice                    Beef broth                            Chicken broth Jell-O                                     Grape juice                           Apple juice Coffee or tea  Jell-O                                      Popsicle                                                Coffee or tea                        Coffee or tea    Complete one Ensure drink the morning of surgery by 10:00AM the day of surgery.   Take these medicines the morning of surgery with A SIP OF WATER: Buspirone, Duloxetine, Hydrocodone if needed   Bring Asthma inhalers day of surgery                               You may not have any metal on your body including hair pins, jewelry, and body piercings             Do not wear make-up, lotions,  powders, perfumes/cologne, or deodorant             Do not wear nail polish.  Do not shave  48 hours prior to surgery.                Do not bring valuables to the hospital. [place].   Contacts, dentures or bridgework may not be worn into surgery.    Patients discharged the day of surgery will not be allowed to drive home.   Special Instructions: Bring a copy of your healthcare power of attorney and living will documents         the day of surgery if you haven't scanned them in before.              Please read over the following fact sheets you were given:  [facility] - Preparing for Surgery Before surgery, you can play an important role.  Because skin is not sterile, your skin needs to be as free of germs as possible.  You can reduce the number of germs on your skin by washing with CHG (chlorahexidine gluconate) soap before surgery.  CHG is an antiseptic cleaner which kills germs and bonds with the skin to continue killing germs even after washing. Please DO NOT use if you have an allergy to CHG or antibacterial soaps.  If your skin becomes reddened/irritated stop using the CHG and inform your nurse when you arrive at Short Stay. Do not shave (including legs and underarms) for at least 48 hours prior to the first CHG shower.  You may shave your face/neck.  Please follow these instructions carefully:  1.  Shower with CHG Soap the night before surgery and the  morning of surgery.  2.  If you choose to wash your hair, wash your hair first as usual with your normal  shampoo.  3.  After you shampoo, rinse your hair and body thoroughly to remove the shampoo.  4.  Use CHG as you would any other liquid soap.  You can apply chg directly to the skin and wash.  Gently with a scrungie or clean washcloth.  5.  Apply the CHG Soap to your body ONLY FROM THE NECK DOWN.   Do   not use on face/ open                           Wound  or open sores. Avoid contact with eyes, ears mouth and   genitals (private parts).                       Wash face,  Genitals (private parts) with your normal soap.             6.  Wash thoroughly, paying special attention to the area where your    surgery  will be performed.  7.  Thoroughly rinse your body with warm water from the neck down.  8.  DO NOT shower/wash with your normal soap after using and rinsing off the CHG Soap.                9.  Pat yourself dry with a clean towel.            10.  Wear clean pajamas.            11.  Place clean sheets on your bed the night of your first shower and do not  sleep with pets. Day of Surgery : Do not apply any lotions/deodorants the morning of surgery.  Please wear clean clothes to the hospital/surgery center.  FAILURE TO FOLLOW THESE INSTRUCTIONS MAY RESULT IN THE CANCELLATION OF YOUR SURGERY  PATIENT SIGNATURE_________________________________  NURSE SIGNATURE__________________________________  ________________________________________________________________________

## 2017-12-02 ENCOUNTER — Encounter (HOSPITAL_COMMUNITY): Payer: Self-pay | Admitting: *Deleted

## 2017-12-02 ENCOUNTER — Ambulatory Visit (HOSPITAL_COMMUNITY)
Admission: RE | Admit: 2017-12-02 | Discharge: 2017-12-02 | Disposition: A | Discharge status: Home / Self Care | Payer: Medicare Other | Source: Ambulatory Visit | Attending: Surgery | Admitting: Surgery

## 2017-12-02 ENCOUNTER — Ambulatory Visit (HOSPITAL_COMMUNITY): Payer: Medicare Other | Admitting: Certified Registered Nurse Anesthetist

## 2017-12-02 ENCOUNTER — Encounter (HOSPITAL_COMMUNITY)
Admission: RE | Disposition: A | Discharge status: Home / Self Care | Payer: Self-pay | Source: Ambulatory Visit | Attending: Surgery

## 2017-12-02 ENCOUNTER — Ambulatory Visit (HOSPITAL_COMMUNITY): Payer: Medicare Other

## 2017-12-02 DIAGNOSIS — Z8711 Personal history of peptic ulcer disease: Secondary | ICD-10-CM | POA: Diagnosis not present

## 2017-12-02 DIAGNOSIS — F419 Anxiety disorder, unspecified: Secondary | ICD-10-CM | POA: Diagnosis not present

## 2017-12-02 DIAGNOSIS — M810 Age-related osteoporosis without current pathological fracture: Secondary | ICD-10-CM | POA: Diagnosis not present

## 2017-12-02 DIAGNOSIS — K811 Chronic cholecystitis: Secondary | ICD-10-CM | POA: Diagnosis not present

## 2017-12-02 DIAGNOSIS — Z79899 Other long term (current) drug therapy: Secondary | ICD-10-CM | POA: Diagnosis not present

## 2017-12-02 DIAGNOSIS — K219 Gastro-esophageal reflux disease without esophagitis: Secondary | ICD-10-CM | POA: Insufficient documentation

## 2017-12-02 DIAGNOSIS — Z903 Acquired absence of stomach [part of]: Secondary | ICD-10-CM | POA: Insufficient documentation

## 2017-12-02 DIAGNOSIS — Z79891 Long term (current) use of opiate analgesic: Secondary | ICD-10-CM | POA: Insufficient documentation

## 2017-12-02 DIAGNOSIS — K3184 Gastroparesis: Secondary | ICD-10-CM | POA: Diagnosis not present

## 2017-12-02 DIAGNOSIS — F17210 Nicotine dependence, cigarettes, uncomplicated: Secondary | ICD-10-CM | POA: Insufficient documentation

## 2017-12-02 DIAGNOSIS — I2584 Coronary atherosclerosis due to calcified coronary lesion: Secondary | ICD-10-CM | POA: Diagnosis not present

## 2017-12-02 DIAGNOSIS — G894 Chronic pain syndrome: Secondary | ICD-10-CM | POA: Diagnosis not present

## 2017-12-02 DIAGNOSIS — E559 Vitamin D deficiency, unspecified: Secondary | ICD-10-CM | POA: Insufficient documentation

## 2017-12-02 DIAGNOSIS — Z934 Other artificial openings of gastrointestinal tract status: Secondary | ICD-10-CM | POA: Insufficient documentation

## 2017-12-02 DIAGNOSIS — H548 Legal blindness, as defined in USA: Secondary | ICD-10-CM | POA: Insufficient documentation

## 2017-12-02 DIAGNOSIS — Z9981 Dependence on supplemental oxygen: Secondary | ICD-10-CM | POA: Insufficient documentation

## 2017-12-02 DIAGNOSIS — R1010 Upper abdominal pain, unspecified: Secondary | ICD-10-CM | POA: Diagnosis present

## 2017-12-02 DIAGNOSIS — G4700 Insomnia, unspecified: Secondary | ICD-10-CM | POA: Diagnosis not present

## 2017-12-02 DIAGNOSIS — K838 Other specified diseases of biliary tract: Secondary | ICD-10-CM

## 2017-12-02 DIAGNOSIS — F339 Major depressive disorder, recurrent, unspecified: Secondary | ICD-10-CM | POA: Insufficient documentation

## 2017-12-02 DIAGNOSIS — J449 Chronic obstructive pulmonary disease, unspecified: Secondary | ICD-10-CM | POA: Diagnosis not present

## 2017-12-02 DIAGNOSIS — I2510 Atherosclerotic heart disease of native coronary artery without angina pectoris: Secondary | ICD-10-CM | POA: Diagnosis not present

## 2017-12-02 DIAGNOSIS — Z9181 History of falling: Secondary | ICD-10-CM | POA: Insufficient documentation

## 2017-12-02 DIAGNOSIS — E785 Hyperlipidemia, unspecified: Secondary | ICD-10-CM | POA: Diagnosis not present

## 2017-12-02 HISTORY — DX: Dependence on supplemental oxygen: Z99.81

## 2017-12-02 HISTORY — PX: CHOLECYSTECTOMY: SHX55

## 2017-12-02 LAB — BASIC METABOLIC PANEL
Anion gap: 10 (ref 5–15)
BUN: 10 mg/dL (ref 6–20)
CALCIUM: 9.6 mg/dL (ref 8.9–10.3)
CHLORIDE: 97 mmol/L — AB (ref 101–111)
CO2: 33 mmol/L — AB (ref 22–32)
CREATININE: 0.68 mg/dL (ref 0.44–1.00)
GFR calc non Af Amer: >60 mL/min (ref >60)
GLUCOSE: 106 mg/dL — AB (ref 65–99)
Potassium: 4.3 mmol/L (ref 3.5–5.1)
Sodium: 140 mmol/L (ref 135–145)

## 2017-12-02 LAB — CBC
HCT: 45.1 % (ref 36.0–46.0)
Hemoglobin: 14.7 g/dL (ref 12.0–15.0)
MCH: 30.4 pg (ref 26.0–34.0)
MCHC: 32.6 g/dL (ref 30.0–36.0)
MCV: 93.2 fL (ref 78.0–100.0)
PLATELETS: 327 10*3/uL (ref 150–400)
RBC: 4.84 MIL/uL (ref 3.87–5.11)
RDW: 13.8 % (ref 11.5–15.5)
WBC: 5.5 10*3/uL (ref 4.0–10.5)

## 2017-12-02 SURGERY — LAPAROSCOPIC CHOLECYSTECTOMY WITH INTRAOPERATIVE CHOLANGIOGRAM
Anesthesia: General

## 2017-12-02 MED ORDER — DEXAMETHASONE SODIUM PHOSPHATE 10 MG/ML IJ SOLN
INTRAMUSCULAR | Status: AC
  Filled 2017-12-02: qty 1

## 2017-12-02 MED ORDER — KETAMINE HCL 10 MG/ML IJ SOLN
INTRAMUSCULAR | Status: DC | PRN
  Administered 2017-12-02: 20 mg via INTRAVENOUS

## 2017-12-02 MED ORDER — ALBUTEROL SULFATE HFA 108 (90 BASE) MCG/ACT IN AERS
INHALATION_SPRAY | RESPIRATORY_TRACT | Status: DC | PRN
  Administered 2017-12-02: 2 via RESPIRATORY_TRACT

## 2017-12-02 MED ORDER — MEPERIDINE HCL 50 MG/ML IJ SOLN
6.2500 mg | INTRAMUSCULAR | Status: DC | PRN
Start: 2017-12-02 — End: 2017-12-02

## 2017-12-02 MED ORDER — SUGAMMADEX SODIUM 200 MG/2ML IV SOLN
INTRAVENOUS | Status: DC | PRN
  Administered 2017-12-02: 200 mg via INTRAVENOUS

## 2017-12-02 MED ORDER — HEPARIN SODIUM (PORCINE) 5000 UNIT/ML IJ SOLN
5000.0000 [IU] | Freq: Once | INTRAMUSCULAR | Status: AC
  Administered 2017-12-02: 5000 [IU] via SUBCUTANEOUS
  Filled 2017-12-02: qty 1

## 2017-12-02 MED ORDER — PHENYLEPHRINE 40 MCG/ML (10ML) SYRINGE FOR IV PUSH (FOR BLOOD PRESSURE SUPPORT)
PREFILLED_SYRINGE | INTRAVENOUS | Status: AC
  Filled 2017-12-02: qty 10

## 2017-12-02 MED ORDER — ALBUTEROL SULFATE HFA 108 (90 BASE) MCG/ACT IN AERS
INHALATION_SPRAY | RESPIRATORY_TRACT | Status: AC
  Filled 2017-12-02: qty 6.7

## 2017-12-02 MED ORDER — CHLORHEXIDINE GLUCONATE CLOTH 2 % EX PADS
6.0000 | MEDICATED_PAD | Freq: Once | CUTANEOUS | Status: AC
  Administered 2017-12-02: 6 via TOPICAL

## 2017-12-02 MED ORDER — DEXAMETHASONE SODIUM PHOSPHATE 10 MG/ML IJ SOLN
INTRAMUSCULAR | Status: DC | PRN
  Administered 2017-12-02: 5 mg via INTRAVENOUS

## 2017-12-02 MED ORDER — ROCURONIUM BROMIDE 10 MG/ML (PF) SYRINGE
PREFILLED_SYRINGE | INTRAVENOUS | Status: AC
  Filled 2017-12-02: qty 5

## 2017-12-02 MED ORDER — CEFAZOLIN SODIUM-DEXTROSE 2-4 GM/100ML-% IV SOLN
2.0000 g | INTRAVENOUS | Status: AC
  Administered 2017-12-02: 2 g via INTRAVENOUS
  Filled 2017-12-02: qty 100

## 2017-12-02 MED ORDER — ONDANSETRON HCL 4 MG/2ML IJ SOLN
INTRAMUSCULAR | Status: AC
  Filled 2017-12-02: qty 2

## 2017-12-02 MED ORDER — FENTANYL CITRATE (PF) 250 MCG/5ML IJ SOLN
INTRAMUSCULAR | Status: DC | PRN
  Administered 2017-12-02 (×5): 50 ug via INTRAVENOUS

## 2017-12-02 MED ORDER — IOPAMIDOL (ISOVUE-300) INJECTION 61%
INTRAVENOUS | Status: AC
  Filled 2017-12-02: qty 50

## 2017-12-02 MED ORDER — MIDAZOLAM HCL 2 MG/2ML IJ SOLN
INTRAMUSCULAR | Status: AC
  Filled 2017-12-02: qty 2

## 2017-12-02 MED ORDER — MIDAZOLAM HCL 5 MG/5ML IJ SOLN
INTRAMUSCULAR | Status: DC | PRN
  Administered 2017-12-02: 2 mg via INTRAVENOUS

## 2017-12-02 MED ORDER — ONDANSETRON HCL 4 MG/2ML IJ SOLN: 4.0000 mg | Freq: Once | INTRAMUSCULAR | Status: DC | PRN

## 2017-12-02 MED ORDER — LIDOCAINE 2% (20 MG/ML) 5 ML SYRINGE
INTRAMUSCULAR | Status: DC | PRN
  Administered 2017-12-02: 1.5 mg/kg/h via INTRAVENOUS

## 2017-12-02 MED ORDER — SUCCINYLCHOLINE CHLORIDE 200 MG/10ML IV SOSY
PREFILLED_SYRINGE | INTRAVENOUS | Status: AC
  Filled 2017-12-02: qty 10

## 2017-12-02 MED ORDER — PROPOFOL 10 MG/ML IV BOLUS
INTRAVENOUS | Status: DC | PRN
  Administered 2017-12-02: 120 mg via INTRAVENOUS

## 2017-12-02 MED ORDER — LIP MEDEX EX OINT
TOPICAL_OINTMENT | CUTANEOUS | Status: AC
  Filled 2017-12-02: qty 7

## 2017-12-02 MED ORDER — GABAPENTIN 300 MG PO CAPS
300.0000 mg | ORAL_CAPSULE | ORAL | Status: AC
  Administered 2017-12-02: 300 mg via ORAL
  Filled 2017-12-02: qty 1

## 2017-12-02 MED ORDER — LACTATED RINGERS IR SOLN
Status: DC | PRN
  Administered 2017-12-02: 1000 mL

## 2017-12-02 MED ORDER — BUPIVACAINE LIPOSOME 1.3 % IJ SUSP
INTRAMUSCULAR | Status: DC | PRN
  Administered 2017-12-02: 20 mL

## 2017-12-02 MED ORDER — CELECOXIB 200 MG PO CAPS: 200.0000 mg | ORAL_CAPSULE | ORAL | Status: DC

## 2017-12-02 MED ORDER — OXYCODONE HCL 5 MG PO TABS: 5.0000 mg | ORAL_TABLET | Freq: Four times a day (QID) | ORAL | 0 refills | Status: AC | PRN

## 2017-12-02 MED ORDER — PHENYLEPHRINE HCL 10 MG/ML IJ SOLN
INTRAMUSCULAR | Status: DC | PRN
  Administered 2017-12-02 (×2): 80 ug via INTRAVENOUS

## 2017-12-02 MED ORDER — ROCURONIUM BROMIDE 50 MG/5ML IV SOSY
PREFILLED_SYRINGE | INTRAVENOUS | Status: DC | PRN
  Administered 2017-12-02: 50 mg via INTRAVENOUS

## 2017-12-02 MED ORDER — HYDROMORPHONE HCL 1 MG/ML IJ SOLN: 0.2500 mg | INTRAMUSCULAR | Status: DC | PRN

## 2017-12-02 MED ORDER — IOPAMIDOL (ISOVUE-300) INJECTION 61%
INTRAVENOUS | Status: DC | PRN
  Administered 2017-12-02: 8.5 mL

## 2017-12-02 MED ORDER — LACTATED RINGERS IV SOLN
INTRAVENOUS | Status: DC
  Administered 2017-12-02 (×2): via INTRAVENOUS

## 2017-12-02 MED ORDER — FENTANYL CITRATE (PF) 250 MCG/5ML IJ SOLN
INTRAMUSCULAR | Status: AC
Start: 2017-12-02 — End: ?
  Filled 2017-12-02: qty 5

## 2017-12-02 MED ORDER — 0.9 % SODIUM CHLORIDE (POUR BTL) OPTIME
TOPICAL | Status: DC | PRN
  Administered 2017-12-02: 1000 mL

## 2017-12-02 MED ORDER — ACETAMINOPHEN 500 MG PO TABS
1000.0000 mg | ORAL_TABLET | ORAL | Status: AC
  Administered 2017-12-02: 1000 mg via ORAL
  Filled 2017-12-02: qty 2

## 2017-12-02 MED ORDER — LIDOCAINE 2% (20 MG/ML) 5 ML SYRINGE
INTRAMUSCULAR | Status: DC | PRN
  Administered 2017-12-02: 40 mg via INTRAVENOUS
  Administered 2017-12-02: 60 mg via INTRAVENOUS

## 2017-12-02 MED ORDER — LIDOCAINE 2% (20 MG/ML) 5 ML SYRINGE
INTRAMUSCULAR | Status: AC
  Filled 2017-12-02: qty 5

## 2017-12-02 SURGICAL SUPPLY — 37 items
APPLICATOR COTTON TIP 6IN STRL (MISCELLANEOUS) ×6 IMPLANT
APPLIER CLIP ROT 10 11.4 M/L (STAPLE) ×3
BENZOIN TINCTURE PRP APPL 2/3 (GAUZE/BANDAGES/DRESSINGS) IMPLANT
CABLE HIGH FREQUENCY MONO STRZ (ELECTRODE) IMPLANT
CATH REDDICK CHOLANGI 4FR 50CM (CATHETERS) ×3 IMPLANT
CLIP APPLIE ROT 10 11.4 M/L (STAPLE) ×1 IMPLANT
CLOSURE WOUND 1/2 X4 (GAUZE/BANDAGES/DRESSINGS)
COVER MAYO STAND STRL (DRAPES) ×3 IMPLANT
COVER SURGICAL LIGHT HANDLE (MISCELLANEOUS) ×3 IMPLANT
DECANTER SPIKE VIAL GLASS SM (MISCELLANEOUS) ×3 IMPLANT
DERMABOND ADVANCED (GAUZE/BANDAGES/DRESSINGS) ×2
DERMABOND ADVANCED .7 DNX12 (GAUZE/BANDAGES/DRESSINGS) ×1 IMPLANT
DRAPE C-ARM 42X120 X-RAY (DRAPES) ×3 IMPLANT
ELECT PENCIL ROCKER SW 15FT (MISCELLANEOUS) ×3 IMPLANT
ELECT REM PT RETURN 15FT ADLT (MISCELLANEOUS) ×3 IMPLANT
GLOVE BIOGEL M 8.0 STRL (GLOVE) ×3 IMPLANT
GOWN STRL REUS W/TWL XL LVL3 (GOWN DISPOSABLE) ×9 IMPLANT
HEMOSTAT SURGICEL 4X8 (HEMOSTASIS) IMPLANT
IV CATH 14GX2 1/4 (CATHETERS) ×3 IMPLANT
KIT BASIN OR (CUSTOM PROCEDURE TRAY) ×3 IMPLANT
L-HOOK LAP DISP 36CM (ELECTROSURGICAL) ×6
LHOOK LAP DISP 36CM (ELECTROSURGICAL) ×2 IMPLANT
POUCH RETRIEVAL ECOSAC 10 (ENDOMECHANICALS) ×1 IMPLANT
POUCH RETRIEVAL ECOSAC 10MM (ENDOMECHANICALS) ×2
SCISSORS LAP 5X45 EPIX DISP (ENDOMECHANICALS) ×3 IMPLANT
SET IRRIG TUBING LAPAROSCOPIC (IRRIGATION / IRRIGATOR) ×3 IMPLANT
SHEARS HARMONIC ACE PLUS 45CM (MISCELLANEOUS) ×3 IMPLANT
SLEEVE XCEL OPT CAN 5 100 (ENDOMECHANICALS) ×3 IMPLANT
STRIP CLOSURE SKIN 1/2X4 (GAUZE/BANDAGES/DRESSINGS) IMPLANT
SUT MNCRL AB 4-0 PS2 18 (SUTURE) ×3 IMPLANT
SYR 20CC LL (SYRINGE) ×3 IMPLANT
TOWEL OR 17X26 10 PK STRL BLUE (TOWEL DISPOSABLE) ×3 IMPLANT
TRAY LAPAROSCOPIC (CUSTOM PROCEDURE TRAY) ×3 IMPLANT
TROCAR BLADELESS OPT 5 100 (ENDOMECHANICALS) ×3 IMPLANT
TROCAR XCEL BLUNT TIP 100MML (ENDOMECHANICALS) IMPLANT
TROCAR XCEL NON-BLD 11X100MML (ENDOMECHANICALS) ×3 IMPLANT
TUBING INSUF HEATED (TUBING) ×3 IMPLANT

## 2017-12-02 NOTE — Anesthesia Preprocedure Evaluation (Signed)
Anesthesia Evaluation  Patient identified by MRN, date of birth, ID band Patient awake    Reviewed: Allergy & Precautions, NPO status , Patient's Chart, lab work & pertinent test results  Airway Mallampati: I  TM Distance: >3 FB Neck ROM: Full    Dental   Pulmonary COPD, Current Smoker,    Pulmonary exam normal        Cardiovascular Normal cardiovascular exam     Neuro/Psych    GI/Hepatic GERD  Medicated and Controlled,  Endo/Other    Renal/GU      Musculoskeletal   Abdominal   Peds  Hematology   Anesthesia Other Findings   Reproductive/Obstetrics                             Anesthesia Physical Anesthesia Plan  ASA: III  Anesthesia Plan: General   Post-op Pain Management:    Induction: Intravenous  PONV Risk Score and Plan: 2 and Ondansetron and Midazolam  Airway Management Planned: Oral ETT  Additional Equipment:   Intra-op Plan:   Post-operative Plan: Extubation in OR  Informed Consent: I have reviewed the patients History and Physical, chart, labs and discussed the procedure including the risks, benefits and alternatives for the proposed anesthesia with the patient or authorized representative who has indicated his/her understanding and acceptance.     Plan Discussed with: CRNA and Surgeon  Anesthesia Plan Comments:         Anesthesia Quick Evaluation

## 2017-12-02 NOTE — Discharge Instructions (Addendum)
Laparoscopic Cholecystectomy °Laparoscopic cholecystectomy is surgery to remove the gallbladder. The gallbladder is a pear-shaped organ that lies beneath the liver on the right side of the body. The gallbladder stores bile, which is a fluid that helps the body to digest fats. Cholecystectomy is often done for inflammation of the gallbladder (cholecystitis). This condition is usually caused by a buildup of gallstones (cholelithiasis) in the gallbladder. Gallstones can block the flow of bile, which can result in inflammation and pain. In severe cases, emergency surgery may be required. °This procedure is done though small incisions in your abdomen (laparoscopic surgery). A thin scope with a camera (laparoscope) is inserted through one incision. Thin surgical instruments are inserted through the other incisions. In some cases, a laparoscopic procedure may be turned into a type of surgery that is done through a larger incision (open surgery). °Tell a health care provider about: °· Any allergies you have. °· All medicines you are taking, including vitamins, herbs, eye drops, creams, and over-the-counter medicines. °· Any problems you or family members have had with anesthetic medicines. °· Any blood disorders you have. °· Any surgeries you have had. °· Any medical conditions you have. °· Whether you are pregnant or may be pregnant. °What are the risks? °Generally, this is a safe procedure. However, problems may occur, including: °· Infection. °· Bleeding. °· Allergic reactions to medicines. °· Damage to other structures or organs. °· A stone remaining in the common bile duct. The common bile duct carries bile from the gallbladder into the small intestine. °· A bile leak from the cyst duct that is clipped when your gallbladder is removed. ° °What happens before the procedure? °Staying hydrated °Follow instructions from your health care provider about hydration, which may include: °· Up to 2 hours before the procedure -  you may continue to drink clear liquids, such as water, clear fruit juice, black coffee, and plain tea. ° °Eating and drinking restrictions °Follow instructions from your health care provider about eating and drinking, which may include: °· 8 hours before the procedure - stop eating heavy meals or foods such as meat, fried foods, or fatty foods. °· 6 hours before the procedure - stop eating light meals or foods, such as toast or cereal. °· 6 hours before the procedure - stop drinking milk or drinks that contain milk. °· 2 hours before the procedure - stop drinking clear liquids. ° °Medicines °· Ask your health care provider about: °? Changing or stopping your regular medicines. This is especially important if you are taking diabetes medicines or blood thinners. °? Taking medicines such as aspirin and ibuprofen. These medicines can thin your blood. Do not take these medicines before your procedure if your health care provider instructs you not to. °· You may be given antibiotic medicine to help prevent infection. °General instructions °· Let your health care provider know if you develop a cold or an infection before surgery. °· Plan to have someone take you home from the hospital or clinic. °· Ask your health care provider how your surgical site will be marked or identified. °What happens during the procedure? °· To reduce your risk of infection: °? Your health care team will wash or sanitize their hands. °? Your skin will be washed with soap. °? Hair may be removed from the surgical area. °· An IV tube may be inserted into one of your veins. °· You will be given one or more of the following: °? A medicine to help you relax (sedative). °?   A medicine to make you fall asleep (general anesthetic).  A breathing tube will be placed in your mouth.  Your surgeon will make several small cuts (incisions) in your abdomen.  The laparoscope will be inserted through one of the small incisions. The camera on the laparoscope  will send images to a TV screen (monitor) in the operating room. This lets your surgeon see inside your abdomen.  Air-like gas will be pumped into your abdomen. This will expand your abdomen to give the surgeon more room to perform the surgery.  Other tools that are needed for the procedure will be inserted through the other incisions. The gallbladder will be removed through one of the incisions.  Your common bile duct may be examined. If stones are found in the common bile duct, they may be removed.  After your gallbladder has been removed, the incisions will be closed with stitches (sutures), staples, or skin glue.  Your incisions may be covered with a bandage (dressing). The procedure may vary among health care providers and hospitals. What happens after the procedure?  Your blood pressure, heart rate, breathing rate, and blood oxygen level will be monitored until the medicines you were given have worn off.  You will be given medicines as needed to control your pain.  Do not drive for 24 hours if you were given a sedative. This information is not intended to replace advice given to you by your health care provider. Make sure you discuss any questions you have with your health care provider. Document Released: 06/10/2005 Document Revised: 12/31/2015 Document Reviewed: 11/27/2015 Elsevier Interactive Patient Education  2018 [place] Anesthesia, Adult, Care After These instructions provide you with information about caring for yourself after your procedure. Your health care provider may also give you more specific instructions. Your treatment has been planned according to current medical practices, but problems sometimes occur. Call your health care provider if you have any problems or questions after your procedure. What can I expect after the procedure? After the procedure, it is common to have:  Vomiting.  A sore throat.  Mental slowness.  It is common to  feel:  Nauseous.  Cold or shivery.  Sleepy.  Tired.  Sore or achy, even in parts of your body where you did not have surgery.  Follow these instructions at home: For at least 24 hours after the procedure:  Do not: ? Participate in activities where you could fall or become injured. ? Drive. ? Use heavy machinery. ? Drink alcohol. ? Take sleeping pills or medicines that cause drowsiness. ? Make important decisions or sign legal documents. ? Take care of children on your own.  Rest. Eating and drinking  If you vomit, drink water, juice, or soup when you can drink without vomiting.  Drink enough fluid to keep your urine clear or pale yellow.  Make sure you have little or no nausea before eating solid foods.  Follow the diet recommended by your health care provider. General instructions  Have a responsible adult stay with you until you are awake and alert.  Return to your normal activities as told by your health care provider. Ask your health care provider what activities are safe for you.  Take over-the-counter and prescription medicines only as told by your health care provider.  If you smoke, do not smoke without supervision.  Keep all follow-up visits as told by your health care provider. This is important. Contact a health care provider if:  You continue to  have nausea or vomiting at home, and medicines are not helpful.  You cannot drink fluids or start eating again.  You cannot urinate after 8-12 hours.  You develop a skin rash.  You have fever.  You have increasing redness at the site of your procedure. Get help right away if:  You have difficulty breathing.  You have chest pain.  You have unexpected bleeding.  You feel that you are having a life-threatening or urgent problem. This information is not intended to replace advice given to you by your health care provider. Make sure you discuss any questions you have with your health care  provider. Document Released: 09/16/2000 Document Revised: 11/13/2015 Document Reviewed: 05/25/2015 Elsevier Interactive Patient Education  [employer].

## 2017-12-02 NOTE — Op Note (Signed)
[name]  Primary Care Physician:  Shawnee Knapp, MD    12/02/2017  2:46 PM  Procedure: Laparoscopic Cholecystectomy with intraoperative cholangiogram  Surgeon: Catalina Antigua B. Hassell Done, MD, FACS Asst:  none  Anes:  General  Drains:  None  Findings: Severe chronic cholecystitis with normal IOC;  pancreatogram  Description of Procedure: The patient was taken to OR 2 and given general anesthesia.  The patient was prepped with PCMX and draped sterilely. A time out was performed.  Access to the abdomen was achieved with a 5 mm Optiview through the right upper quadrant.  Port placement included three 5 mm trocars and one 11 mm in the midline.    The gallbladder was visualized and the fundus was grasped and the gallbladder was elevated. I had to take down a lot of filmy adhesions to even expose it.  This was taken down with the harmonic scalpel.   Traction on the infundibulum allowed for successful demonstration of the critical view. Inflammatory changes were severe.  The cystic duct was identified and clipped up on the gallbladder and an incision was made in the cystic duct and the Reddick catheter was inserted after milking the cystic duct of any debris. A dynamic cholangiogram was performed which demonstrated intrahepatic filling and flow into the duodenum.    The cystic duct was then triple clipped and divided, the cystic artery was double clipped and divided and then the gallbladder was removed from the gallbladder bed. Removal of the gallbladder from the gallbladder bed was performed without entering it.  The gallbladder was then placed in a bag and brought out through one of the trocar sites. The gallbladder bed was inspected and no bleeding or bile leaks were seen.   Laparoscopic visualization was used when closing fascial defects for trocar sites in the upper midline with a fig of 8 0 vicryl.   Incisions were injected with Exparel and closed with 4-0 Monocryl and Dermabond on the skin.  Sponge and  needle count were correct.    The patient was taken to the recovery room in satisfactory condition.

## 2017-12-02 NOTE — Telephone Encounter (Signed)
done in error 

## 2017-12-02 NOTE — Transfer of Care (Signed)
Immediate Anesthesia Transfer of Care Note  Patient: [name]  Procedure(s) Performed: LAPAROSCOPIC CHOLECYSTECTOMY WITH INTRAOPERATIVE CHOLANGIOGRAM ERAS PATHWAY (N/A )  Patient Location: PACU  Anesthesia Type:General  Level of Consciousness: awake, alert , oriented and patient cooperative  Airway & Oxygen Therapy: Patient Spontanous Breathing and Patient connected to face mask oxygen  Post-op Assessment: Report given to RN, Post -op Vital signs reviewed and stable and Patient moving all extremities  Post vital signs: Reviewed and stable  Last Vitals:  Vitals Value Taken Time  BP 158/89 12/02/2017  2:48 PM  Temp 36.4 C 12/02/2017  2:47 PM  Pulse 81 12/02/2017  2:51 PM  Resp 20 12/02/2017  2:51 PM  SpO2 100 % 12/02/2017  2:51 PM  Vitals shown include unvalidated device data.  Last Pain:  Vitals:   12/02/17 1049  TempSrc:   PainSc: 4          Complications: No apparent anesthesia complications

## 2017-12-02 NOTE — Interval H&P Note (Signed)
History and Physical Interval Note:  12/02/2017 11:18 AM  [name]  has presented today for surgery, with the diagnosis of biliary sludge  The various methods of treatment have been discussed with the patient and family. After consideration of risks, benefits and other options for treatment, the patient has consented to  Procedure(s): LAPAROSCOPIC CHOLECYSTECTOMY WITH INTRAOPERATIVE CHOLANGIOGRAM ERAS PATHWAY (N/A) as a surgical intervention .  The patient's history has been reviewed, patient examined, no change in status, stable for surgery.  I have reviewed the patient's chart and labs.  Questions were answered to the patient's satisfaction.     Pedro Earls

## 2017-12-02 NOTE — Anesthesia Postprocedure Evaluation (Signed)
Anesthesia Post Note  Patient: Rory Percy  Procedure(s) Performed: LAPAROSCOPIC CHOLECYSTECTOMY WITH INTRAOPERATIVE CHOLANGIOGRAM ERAS PATHWAY (N/A )     Patient location during evaluation: PACU Anesthesia Type: General Level of consciousness: awake and alert Pain management: pain level controlled Vital Signs Assessment: post-procedure vital signs reviewed and stable Respiratory status: spontaneous breathing, nonlabored ventilation, respiratory function stable and patient connected to nasal cannula oxygen Cardiovascular status: blood pressure returned to baseline and stable Postop Assessment: no apparent nausea or vomiting Anesthetic complications: no    Last Vitals:  Vitals:   12/02/17 1029 12/02/17 1447  BP: (!) 143/83 (!) 158/89  Pulse: 85 84  Resp: 16 (!) 21  Temp: 36.9 C 36.4 C  SpO2: 99% 100%    Last Pain:  Vitals:   12/02/17 1447  TempSrc:   PainSc: (P) Asleep                 Moxie Kalil DAVID

## 2017-12-03 ENCOUNTER — Encounter (HOSPITAL_COMMUNITY): Payer: Self-pay | Admitting: Surgery

## 2017-12-08 ENCOUNTER — Ambulatory Visit: Payer: Medicare Other | Admitting: Family Medicine

## 2017-12-10 DIAGNOSIS — Z79899 Other long term (current) drug therapy: Secondary | ICD-10-CM | POA: Diagnosis not present

## 2017-12-10 DIAGNOSIS — K660 Peritoneal adhesions (postprocedural) (postinfection): Secondary | ICD-10-CM | POA: Diagnosis not present

## 2017-12-16 ENCOUNTER — Other Ambulatory Visit: Payer: Self-pay | Admitting: Family Medicine

## 2017-12-17 ENCOUNTER — Ambulatory Visit: Payer: Medicare Other | Admitting: Physician Assistant

## 2017-12-18 ENCOUNTER — Other Ambulatory Visit: Payer: Self-pay

## 2017-12-18 ENCOUNTER — Encounter: Payer: Self-pay | Admitting: Family Medicine

## 2017-12-18 ENCOUNTER — Ambulatory Visit (INDEPENDENT_AMBULATORY_CARE_PROVIDER_SITE_OTHER): Payer: Medicare Other

## 2017-12-18 ENCOUNTER — Ambulatory Visit (INDEPENDENT_AMBULATORY_CARE_PROVIDER_SITE_OTHER): Payer: Medicare Other | Admitting: Family Medicine

## 2017-12-18 VITALS — BP 132/82 | HR 97 | Temp 99.0°F | Resp 16 | Ht 63.58 in | Wt 101.6 lb

## 2017-12-18 DIAGNOSIS — S6992XD Unspecified injury of left wrist, hand and finger(s), subsequent encounter: Secondary | ICD-10-CM

## 2017-12-18 DIAGNOSIS — K3184 Gastroparesis: Secondary | ICD-10-CM

## 2017-12-18 DIAGNOSIS — J0120 Acute ethmoidal sinusitis, unspecified: Secondary | ICD-10-CM | POA: Diagnosis not present

## 2017-12-18 DIAGNOSIS — R509 Fever, unspecified: Secondary | ICD-10-CM

## 2017-12-18 DIAGNOSIS — J449 Chronic obstructive pulmonary disease, unspecified: Secondary | ICD-10-CM

## 2017-12-18 DIAGNOSIS — R636 Underweight: Secondary | ICD-10-CM

## 2017-12-18 DIAGNOSIS — K911 Postgastric surgery syndromes: Secondary | ICD-10-CM

## 2017-12-18 DIAGNOSIS — F411 Generalized anxiety disorder: Secondary | ICD-10-CM

## 2017-12-18 DIAGNOSIS — R634 Abnormal weight loss: Secondary | ICD-10-CM

## 2017-12-18 DIAGNOSIS — Z720 Tobacco use: Secondary | ICD-10-CM | POA: Diagnosis not present

## 2017-12-18 DIAGNOSIS — D649 Anemia, unspecified: Secondary | ICD-10-CM

## 2017-12-18 DIAGNOSIS — R05 Cough: Secondary | ICD-10-CM | POA: Diagnosis not present

## 2017-12-18 DIAGNOSIS — B977 Papillomavirus as the cause of diseases classified elsewhere: Secondary | ICD-10-CM

## 2017-12-18 DIAGNOSIS — S63637D Sprain of interphalangeal joint of left little finger, subsequent encounter: Secondary | ICD-10-CM

## 2017-12-18 DIAGNOSIS — R110 Nausea: Secondary | ICD-10-CM

## 2017-12-18 LAB — POCT URINALYSIS DIP (MANUAL ENTRY)
BILIRUBIN UA: NEGATIVE mg/dL
Bilirubin, UA: NEGATIVE
GLUCOSE UA: NEGATIVE mg/dL
Leukocytes, UA: NEGATIVE
Nitrite, UA: NEGATIVE
Protein Ur, POC: NEGATIVE mg/dL
RBC UA: NEGATIVE
SPEC GRAV UA: 1.025 (ref 1.010–1.025)
UROBILINOGEN UA: 0.2 U/dL
pH, UA: 5 (ref 5.0–8.0)

## 2017-12-18 LAB — POCT CBC
GRANULOCYTE PERCENT: 61.1 % (ref 37–80)
HEMATOCRIT: 37.7 % (ref 37.7–47.9)
Hemoglobin: 12 g/dL — AB (ref 12.2–16.2)
Lymph, poc: 2.2 (ref 0.6–3.4)
MCH: 29 pg (ref 27–31.2)
MCHC: 31.8 g/dL (ref 31.8–35.4)
MCV: 91.1 fL (ref 80–97)
MID (CBC): 1 — AB (ref 0–0.9)
MPV: 7.9 fL (ref 0–99.8)
PLATELET COUNT, POC: 365 10*3/uL (ref 142–424)
POC GRANULOCYTE: 5.1 (ref 2–6.9)
POC LYMPH %: 26.6 % (ref 10–50)
POC MID %: 12.3 % — AB (ref 0–12)
RBC: 4.14 M/uL (ref 4.04–5.48)
RDW, POC: 14.6 %
WBC: 8.3 10*3/uL (ref 4.6–10.2)

## 2017-12-18 LAB — POC MICROSCOPIC URINALYSIS (UMFC): MUCUS RE: ABSENT

## 2017-12-18 MED ORDER — METHYLPREDNISOLONE ACETATE 80 MG/ML IJ SUSP
120.0000 mg | Freq: Once | INTRAMUSCULAR | Status: AC
  Administered 2017-12-18: 120 mg via INTRAMUSCULAR

## 2017-12-18 MED ORDER — AMOXICILLIN 500 MG PO CAPS: 500.0000 mg | ORAL_CAPSULE | Freq: Three times a day (TID) | ORAL | 0 refills | Status: DC

## 2017-12-18 MED ORDER — BENZONATATE 200 MG PO CAPS: 200.0000 mg | ORAL_CAPSULE | Freq: Three times a day (TID) | ORAL | 0 refills | Status: DC | PRN

## 2017-12-18 MED ORDER — DICLOFENAC SODIUM 1 % TD GEL: 2.0000 g | Freq: Four times a day (QID) | TRANSDERMAL | 0 refills | Status: DC

## 2017-12-18 MED ORDER — IPRATROPIUM BROMIDE 0.03 % NA SOLN: 2.0000 | Freq: Four times a day (QID) | NASAL | 0 refills | Status: DC

## 2017-12-18 NOTE — Telephone Encounter (Signed)
[facility] refill Last Refill:09/12/17 # 180 NO RF Last OV: 08/27/17 PCP: [name] Pharmacy:[facility]

## 2017-12-18 NOTE — Patient Instructions (Addendum)
IF you received an x-ray today, you will receive an invoice from [facility]. Please contact [facility] at [phone] with questions or concerns regarding your invoice.   IF you received labwork today, you will receive an invoice from [facility]. Please contact [facility] at [phone] with questions or concerns regarding your invoice.   Our billing staff will not be able to assist you with questions regarding bills from these companies.  You will be contacted with the lab results as soon as they are available. The fastest way to get your results is to activate your My Chart account. Instructions are located on the last page of this paperwork. If you have not heard from [place] regarding the results in 2 weeks, please contact this office.    Jammed Finger A jammed finger is an injury to the ligaments that support your finger bones. Ligaments are strong bands of tissue that connect bones and keep them in place. This injury happens when the ligaments are stretched beyond their normal range of motion (sprained). What are the causes? A jammed finger is caused by a hard direct hit to the tip of your finger that pushes your finger toward your hand. What increases the risk? This injury is more likely to happen if you play sports. What are the signs or symptoms? Symptoms of a jammed finger include:  Pain.  Swelling.  Discoloration and bruising around the joint.  Difficulty bending or straightening the finger.  Not being able to use the finger normally.  How is this diagnosed? A jammed finger is diagnosed with a medical history and physical exam. You may also have X-rays taken to check for a broken bone (fracture). How is this treated? Treatment for a jammed finger may include:  Wearing a splint.  Taping the injured finger to the fingers beside it (buddy taping).  Medicines used to treat pain.  Depending on the type of injury, you may have to do exercises after  your finger has begun to heal. This helps you regain strength and mobility in the finger. Follow these instructions at home:  Take medicines only as directed by your health care provider.  Apply ice to the injured area: ? Put ice in a plastic bag. ? Place a towel between your skin and the bag. ? Leave the ice on for 20 minutes, 2-3 times per day.  Raise the injured area above the level of your heart while you are sitting or lying down.  Wear the splint or tape as directed by your health care provider. Remove it only as directed by your health care provider.  Rest your finger until your health care provider says you can move it again. Your finger may feel stiff and painful for a while.  Perform strengthening exercises as directed by your health care provider. It may help to start doing these exercises with your hand in a bowl of warm water.  Keep all follow-up visits as directed by your health care provider. This is important. Contact a health care provider if:  You have pain or swelling that is getting worse.  Your finger feels cold.  Your finger looks out of place at the joint (deformity).  You still cannot extend your finger after treatment.  You have a fever. Get help right away if:  Even after loosening your splint, your finger: ? Is very red and swollen. ? Is white or blue. ? Feels tingly or becomes numb. This information is not intended to replace advice given  to you by your health care provider. Make sure you discuss any questions you have with your health care provider. Document Released: 11/28/2009 Document Revised: 11/16/2015 Document Reviewed: 04/13/2014 Elsevier Interactive Patient Education  2018 [employer].   Sinusitis, Adult Sinusitis is soreness and inflammation of your sinuses. Sinuses are hollow spaces in the bones around your face. Your sinuses are located:  Around your eyes.  In the middle of your forehead.  Behind your nose.  In your  cheekbones.  Your sinuses and nasal passages are lined with a stringy fluid (mucus). Mucus normally drains out of your sinuses. When your nasal tissues become inflamed or swollen, the mucus can become trapped or blocked so air cannot flow through your sinuses. This allows bacteria, viruses, and funguses to grow, which leads to infection. Sinusitis can develop quickly and last for 7?10 days (acute) or for more than 12 weeks (chronic). Sinusitis often develops after a cold. What are the causes? This condition is caused by anything that creates swelling in the sinuses or stops mucus from draining, including:  Allergies.  Asthma.  Bacterial or viral infection.  Abnormally shaped bones between the nasal passages.  Nasal growths that contain mucus (nasal polyps).  Narrow sinus openings.  Pollutants, such as chemicals or irritants in the air.  A foreign object stuck in the nose.  A fungal infection. This is rare.  What increases the risk? The following factors may make you more likely to develop this condition:  Having allergies or asthma.  Having had a recent cold or respiratory tract infection.  Having structural deformities or blockages in your nose or sinuses.  Having a weak immune system.  Doing a lot of swimming or diving.  Overusing nasal sprays.  Smoking.  What are the signs or symptoms? The main symptoms of this condition are pain and a feeling of pressure around the affected sinuses. Other symptoms include:  Upper toothache.  Earache.  Headache.  Bad breath.  Decreased sense of smell and taste.  A cough that may get worse at night.  Fatigue.  Fever.  Thick drainage from your nose. The drainage is often green and it may contain pus (purulent).  Stuffy nose or congestion.  Postnasal drip. This is when extra mucus collects in the throat or back of the nose.  Swelling and warmth over the affected sinuses.  Sore throat.  Sensitivity to  light.  How is this diagnosed? This condition is diagnosed based on symptoms, a medical history, and a physical exam. To find out if your condition is acute or chronic, your health care provider may:  Look in your nose for signs of nasal polyps.  Tap over the affected sinus to check for signs of infection.  View the inside of your sinuses using an imaging device that has a light attached (endoscope).  If your health care provider suspects that you have chronic sinusitis, you may also:  Be tested for allergies.  Have a sample of mucus taken from your nose (nasal culture) and checked for bacteria.  Have a mucus sample examined to see if your sinusitis is related to an allergy.  If your sinusitis does not respond to treatment and it lasts longer than 8 weeks, you may have an MRI or CT scan to check your sinuses. These scans also help to determine how severe your infection is. In rare cases, a bone biopsy may be done to rule out more serious types of fungal sinus disease. How is this treated? Treatment for  sinusitis depends on the cause and whether your condition is chronic or acute. If a virus is causing your sinusitis, your symptoms will go away on their own within 10 days. You may be given medicines to relieve your symptoms, including:  Topical nasal decongestants. They shrink swollen nasal passages and let mucus drain from your sinuses.  Antihistamines. These drugs block inflammation that is triggered by allergies. This can help to ease swelling in your nose and sinuses.  Topical nasal corticosteroids. These are nasal sprays that ease inflammation and swelling in your nose and sinuses.  Nasal saline washes. These rinses can help to get rid of thick mucus in your nose.  If your condition is caused by bacteria, you will be given an antibiotic medicine. If your condition is caused by a fungus, you will be given an antifungal medicine. Surgery may be needed to correct underlying  conditions, such as narrow nasal passages. Surgery may also be needed to remove polyps. Follow these instructions at home: Medicines  Take, use, or apply over-the-counter and prescription medicines only as told by your health care provider. These may include nasal sprays.  If you were prescribed an antibiotic medicine, take it as told by your health care provider. Do not stop taking the antibiotic even if you start to feel better. Hydrate and Humidify  Drink enough water to keep your urine clear or pale yellow. Staying hydrated will help to thin your mucus.  Use a cool mist humidifier to keep the humidity level in your home above 50%.  Inhale steam for 10-15 minutes, 3-4 times a day or as told by your health care provider. You can do this in the bathroom while a hot shower is running.  Limit your exposure to cool or dry air. Rest  Rest as much as possible.  Sleep with your head raised (elevated).  Make sure to get enough sleep each night. General instructions  Apply a warm, moist washcloth to your face 3-4 times a day or as told by your health care provider. This will help with discomfort.  Wash your hands often with soap and water to reduce your exposure to viruses and other germs. If soap and water are not available, use hand sanitizer.  Do not smoke. Avoid being around people who are smoking (secondhand smoke).  Keep all follow-up visits as told by your health care provider. This is important. Contact a health care provider if:  You have a fever.  Your symptoms get worse.  Your symptoms do not improve within 10 days. Get help right away if:  You have a severe headache.  You have persistent vomiting.  You have pain or swelling around your face or eyes.  You have vision problems.  You develop confusion.  Your neck is stiff.  You have trouble breathing. This information is not intended to replace advice given to you by your health care provider. Make sure you  discuss any questions you have with your health care provider. Document Released: 06/10/2005 Document Revised: 02/04/2016 Document Reviewed: 04/05/2015 Elsevier Interactive Patient Education  [employer].

## 2017-12-18 NOTE — Progress Notes (Signed)
Subjective:  By signing my name below, I, [name], attest that this documentation has been prepared under the direction and in the presence of [name], MD. Electronically Signed: Moises [name], [place]. 12/18/2017 , 5:14 PM .  Patient was seen in Room 2 .   Patient ID: [name], female    DOB: Jan 05, 1951, 67 y.o.   MRN: [id number] Chief Complaint  Patient presents with  . Cough    pt states shh has had a cough and nasal congestion x 3 days   . Nasal Congestion   HPI [name] is a 67 y.o. female who presents to Primary Care at [facility] complaining of cough and nasal congestion that started 3 days ago. Patient had cholecystectomy done by Dr. Hassell Done on 12/02/17. She describes feeling miserable. She's been able to eat at home. She informs feeling like running a temperature with nasal congestion, and blowing out her nose. She reports blowing her nose constantly, with clear mucus. She denies taking any OTC medications or using nasal spray.   Her left 5th finger has been swollen, and unable to bend it. She's had it splinted until today.   Past Medical History:  Diagnosis Date  . Acute duodenal ulcer with hemorrhage and perforation, with obstruction ([place]) 06/26/2004   multiple pyloric perferations  . Allergy   . Anemia   . Anxiety    sees [name] NP at Dr. Radonna Ricker office  . Asthma    hx of   . Barrett's esophagus   . Chronic abdominal pain    narcotic dependence, dr gyarteng-dak at [facility]  . 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 Complication of anesthesia    pt states had too much xanax on board - agitated   . COPD (chronic obstructive pulmonary disease) ([place])   . Depression   . Exertional shortness of breath   . Fall 11/23/2014   frequent falls  . Fx of fibula 07-28-11   left fibula, 2 places   . Fx two ribs-open 08-24-11   left 4th and 5th  . GASTRIC OUTLET OBSTRUCTION 08/28/2007   In July 2008 she underwent  laparoscopic enterolysis, Nissen fundoplication over a #26 bougie, single pledgeted suture colosure of the hiatus and a 3 suture wrap.  Lap truncal vagotomy and a loop gastrojejunostomy was performed.  This was revised in August of 2009 to a roux en Y gastrojejujostomy.     . Gastroparesis 06/03/2007   Qualifier: Diagnosis of  By: [name] MD, [name]   . GERD (gastroesophageal reflux disease)   . History of [name] transfusion    "related to OR; maybe when I perforated that ulcer"  . History of hiatal hernia   . Lap Nissen + truncal vagotomy July 2008 05/13/2013  . LEG EDEMA 01/19/2008   Qualifier: Diagnosis of  By: [name] MD, [name]   . Macular degeneration, bilateral    early onset, severe, legally blind and worsening. Followed by dr. Katy Fitch  . MENOPAUSE 01/07/2007   Qualifier: Diagnosis of  By: [name], CMA, [place]    . Narcotic abuse ([place])   . Osteoporosis    severe  . Oxygen deficiency   . Oxygen dependent    3L/[place]   . Peptic ulcer disease    dr Oletta Lamas  . Pneumonia, organism unspecified(486) 11/07/2010   Assoc with R Parapneumonic effusion 09/2010    - Tapped 10/05/10    - CxR resolved 11/07/2010    . S/P jejunostomy  Feb 2016 07/26/2014  . Thyroid nodule 2013   benign after biopsy w/ Dr. Redmond Baseman   Past Surgical History:  Procedure Laterality Date  . BIOPSY THYROID  08-13-11   benign nodule, per Dr. Melida Quitter   . CATARACT EXTRACTION W/ INTRAOCULAR LENS  IMPLANT, BILATERAL Bilateral   . CHOLECYSTECTOMY N/A 12/02/2017   Procedure: LAPAROSCOPIC CHOLECYSTECTOMY WITH INTRAOPERATIVE CHOLANGIOGRAM ERAS PATHWAY;  Surgeon: Johnathan Hausen, MD;  Location: [facility];  Service: General;  Laterality: N/A;  . COLONOSCOPY    . DILATION AND CURETTAGE OF UTERUS    . ESOPHAGOGASTRODUODENOSCOPY  12-29-09   dr qadeer at [facility], several gastric ulcers  . ESOPHAGOGASTRODUODENOSCOPY N/A 09/25/2012   Procedure: ESOPHAGOGASTRODUODENOSCOPY (EGD);  Surgeon: Beryle Beams, MD;  Location: [facility];  Service:  Endoscopy;  Laterality: N/A;  . ESOPHAGOGASTRODUODENOSCOPY (EGD) WITH PROPOFOL N/A 06/27/2017   Procedure: ESOPHAGOGASTRODUODENOSCOPY (EGD) WITH PROPOFOL;  Surgeon: Carol Ada, MD;  Location: [facility];  Service: Endoscopy;  Laterality: N/A;  . FRACTURE SURGERY Left 2010   hip and leg s/p fall  . GASTROJEJUNOSTOMY  02/20/2008   Roux en Y gastrojejunsotomy w 1 foot Roux limb, laparotomy, takedown loop gastrojejunostomy with removal of Ntinol stent  . GASTROSTOMY N/A 07/26/2014   Procedure: LAPRASCOPIC ASSISTED OPEN PLACEMENT OF JEJUNOSTOMY TUBE;  Surgeon: Pedro Earls, MD;  Location: [facility];  Service: General;  Laterality: N/A;  . HERNIA REPAIR     "hiatal"  . JOINT REPLACEMENT    . LAPAROSCOPIC NISSEN FUNDOPLICATION  8546  . LAPAROSCOPIC PARTIAL GASTRECTOMY N/A 06/29/2013   Procedure: Gastrectomy and GJ Tube placement;  Surgeon: Pedro Earls, MD;  Location: [facility];  Service: General;  Laterality: N/A;  . ORIF FEMORAL NECK FRACTURE W/ DHS Right 03/2014  . ORIF HIP FRACTURE Left 2010  . REPAIR OF PERFORATED ULCER    . TONSILLECTOMY  1974  . TRUNCAL VAGOTOMY  12/2006   Laparoscopic enterolysis, laparoscopic Nissen's Fundoplication, laparoscopic truncal vagotomy & loop gastrojejunostomy   Prior to Admission medications   Medication Sig Start Date End Date Taking? Authorizing Provider  albuterol (PROVENTIL HFA;VENTOLIN HFA) 108 (90 Base) MCG/ACT inhaler Inhale 2 puffs into the lungs every 4 (four) hours as needed (cough, shortness of breath or wheezing.). 08/01/15   Shawnee Knapp, MD  Aspirin-Acetaminophen-Caffeine (GOODY HEADACHE PO) Take 1 packet by mouth daily as needed (pain).    [provider]  BELSOMRA 20 MG TABS TAKE ONE TABLET AT BEDTIME. 11/03/17   Shawnee Knapp, MD  busPIRone (BUSPAR) 10 MG tablet Take 2 tablets (20 mg total) by mouth 3 (three) times daily. 09/12/17   Shawnee Knapp, MD  busPIRone (BUSPAR) 30 MG tablet Take 1 tablet (30 mg total) by mouth 2 (two) times  daily. Patient not taking: Reported on 11/25/2017 05/17/17   Shawnee Knapp, MD  DULoxetine (CYMBALTA) 30 MG capsule Take 30 mg by mouth daily.    Bulla, Donald, PA-C  DULoxetine (CYMBALTA) 60 MG capsule Take 120 mg by mouth daily.     Bulla, Donald, PA-C  ondansetron (ZOFRAN ODT) 4 MG disintegrating tablet Take 1 tablet (4 mg total) by mouth every 8 (eight) hours as needed for nausea or vomiting. 10/17/17   Street, [place], PA-C  ondansetron (ZOFRAN-ODT) 8 MG disintegrating tablet Take 1 tablet (8 mg total) by mouth every 8 (eight) hours as needed for nausea. 10/17/17   Tenna Delaine D, PA-C  oxyCODONE (OXY IR/ROXICODONE) 5 MG immediate release tablet Take 1 tablet (5 mg total) by mouth  every 6 (six) hours as needed for severe pain. 12/02/17   Johnathan Hausen, MD  ranitidine (ZANTAC) 150 MG tablet Take 1 tablet (150 mg total) by mouth 2 (two) times daily. Patient not taking: Reported on 11/25/2017 10/17/17   Street, [place], PA-C  sucralfate (CARAFATE) 1 g tablet Take 1 tablet (1 g total) by mouth 4 (four) times daily -  with meals and at bedtime. Patient not taking: Reported on 11/25/2017 05/31/17   Shawnee Knapp, MD  traZODone (DESYREL) 150 MG tablet Take 1 tablet (150 mg total) by mouth at bedtime. Patient not taking: Reported on 11/25/2017 05/31/17   Shawnee Knapp, MD  vitamin B-12 (CYANOCOBALAMIN) 1000 MCG tablet Take 1,000 mcg by mouth 3 (three) times a week.    [provider]  Vitamin D, Ergocalciferol, (DRISDOL) 50000 units CAPS capsule Take 1 capsule (50,000 Units total) by mouth every 7 (seven) days. 08/30/17   Shawnee Knapp, MD   Allergies  Allergen Reactions  . Lithium Nausea And Vomiting  . Celebrex [Celecoxib] Other (See Comments)    History of bleeding ulcers  . Morphine Itching  . Quetiapine Other (See Comments)     tardive dyskinesia  . Varenicline Tartrate Other (See Comments)    hallucinations   Family History  Problem Relation Age of Onset  . Cancer Mother         kidney/bladder cancer, magliant skin cancer on face, cervical cancer  . Stroke Mother   . Hypertension Mother   . Celiac disease Father   . Cancer Father        metastatic renal cell carcinoma, pancreatic cancer, colon polyps  . Colon cancer Maternal Uncle   . Breast cancer Neg Hx   . Ovarian cancer Neg Hx   . Endometrial cancer Neg Hx    Social History   Socioeconomic History  . Marital status: Divorced    Spouse name: Not on file  . Number of children: 0  . Years of education: Not on file  . Highest education level: Not on file  Occupational History  . Not on file  Social Needs  . Financial resource strain: Not on file  . Food insecurity:    Worry: Not on file    Inability: Not on file  . Transportation needs:    Medical: Not on file    Non-medical: Not on file  Tobacco Use  . Smoking status: Current Every Day Smoker    Packs/day: 1.00    Years: 51.00    Pack years: 51.00    Types: Cigarettes  . Smokeless tobacco: Never Used  Substance and Sexual Activity  . Alcohol use: No    Alcohol/week: 0.0 oz  . Drug use: No  . Sexual activity: Never  Lifestyle  . Physical activity:    Days per week: Not on file    Minutes per session: Not on file  . Stress: Not on file  Relationships  . Social connections:    Talks on phone: Not on file    Gets together: Not on file    Attends religious service: Not on file    Active member of club or organization: Not on file    Attends meetings of clubs or organizations: Not on file    Relationship status: Not on file  Other Topics Concern  . Not on file  Social History Narrative  . Not on file   Depression screen [facility] 2/9 12/18/2017 09/10/2017 08/30/2017 08/27/2017 05/31/2017  Decreased Interest 0 0  0 0 0  Down, Depressed, Hopeless 0 0 0 0 0  PHQ - 2 Score 0 0 0 0 0  Altered sleeping - - - - -  Tired, decreased energy - - - - -  Change in appetite - - - - -  Feeling bad or failure about yourself  - - - - -  Trouble concentrating -  - - - -  Moving slowly or fidgety/restless - - - - -  Suicidal thoughts - - - - -  PHQ-9 Score - - - - -  Difficult doing work/chores - - - - -  Some recent data might be hidden    Review of Systems  Constitutional: Negative for chills, fatigue, fever and unexpected weight change.  HENT: Positive for congestion.   Respiratory: Positive for cough.   Gastrointestinal: Negative for constipation, diarrhea, nausea and vomiting.  Skin: Negative for rash and wound.  Neurological: Negative for dizziness, weakness and headaches.       Objective:   Physical Exam  Constitutional: She is oriented to person, place, and time. She appears well-developed and well-nourished. No distress.  HENT:  Head: Normocephalic and atraumatic.  Right Ear: Tympanic membrane normal.  Left Ear: Tympanic membrane is injected.  Nose: Mucosal edema and rhinorrhea (with erythema) present.  Mouth/Throat: Oropharynx is clear and moist.  Eyes: Pupils are equal, round, and reactive to light. EOM are normal.  Neck: Neck supple. No thyromegaly present.  Cardiovascular: Normal rate.  Pulmonary/Chest: Effort normal. No respiratory distress.  Musculoskeletal: Normal range of motion.  Left 5th finger: tender over the IP joint, full extension, PIP and DIP limited 90 degrees  Lymphadenopathy:    She has no cervical adenopathy.  Neurological: She is alert and oriented to person, place, and time.  Skin: Skin is warm and dry.  Psychiatric: She has a normal mood and affect. Her behavior is normal.  Nursing note and vitals reviewed.   BP 132/82   Pulse 97   Temp 99 F (37.2 C) (Oral)   Resp 16   Ht 5' 3.58" (1.615 m)   Wt 101 lb 9.6 oz (46.1 kg)   SpO2 95%   BMI 17.67 kg/m   Results for orders placed or performed in visit on 12/18/17  POCT urinalysis dipstick  Result Value Ref Range   Color, UA yellow yellow   Clarity, UA clear clear   Glucose, UA negative negative mg/dL   Bilirubin, UA negative negative    Ketones, POC UA negative negative mg/dL   Spec Grav, UA 1.025 1.010 - 1.025   [name], UA negative negative   pH, UA 5.0 5.0 - 8.0   Protein Ur, POC negative negative mg/dL   Urobilinogen, UA 0.2 0.2 or 1.0 E.U./dL   Nitrite, UA Negative Negative   Leukocytes, UA Negative Negative  POCT Microscopic Urinalysis ([facility])  Result Value Ref Range   WBC,UR,HPF,POC None None WBC/hpf   RBC,UR,HPF,POC None None RBC/hpf   Bacteria None None, Too numerous to count   Mucus Absent Absent   Epithelial Cells, UR Per Microscopy Few (A) None, Too numerous to count cells/hpf  POCT CBC  Result Value Ref Range   WBC 8.3 4.6 - 10.2 K/uL   Lymph, poc 2.2 0.6 - 3.4   POC LYMPH PERCENT 26.6 10 - 50 %L   MID (cbc) 1.0 (A) 0 - 0.9   POC MID % 12.3 (A) 0 - 12 %M   POC Granulocyte 5.1 2 - 6.9   Granulocyte percent  61.1 37 - 80 %G   RBC 4.14 4.04 - 5.48 M/uL   Hemoglobin 12.0 (A) 12.2 - 16.2 g/dL   HCT, POC 37.7 37.7 - 47.9 %   MCV 91.1 80 - 97 fL   MCH, POC 29.0 27 - 31.2 pg   MCHC 31.8 31.8 - 35.4 g/dL   RDW, POC 14.6 %   Platelet Count, POC 365 142 - 424 K/uL   MPV 7.9 0 - 99.8 fL   Dg Chest 2 View  Result Date: 12/18/2017 CLINICAL DATA:  fever, productive cough, tobacco use, recent surgery EXAM: CHEST - 2 VIEW COMPARISON:  Chest CT 10/01/2017 FINDINGS: Lungs are hyperinflated. Heart size is normal. There are no focal consolidations or pleural effusions. No pulmonary edema. There is mild atherosclerotic calcification of the thoracic aorta. Surgical clips are identified in the UPPER abdomen. IMPRESSION: Hyperinflation.  No evidence for acute cardiopulmonary abnormality. Electronically Signed   By: Nolon Nations M.D.   On: 12/18/2017 16:38       Assessment & Plan:   1. Fever, unspecified - urine nml, cbc nml - suspect due to sinusitis  2. Acute non-recurrent ethmoidal sinusitis - depomedrol 120 IM x 1 today, amox 500 tid x 1 wk, atrovent nasal spray, prn tessalon  3. Finger injury, left, subsequent  encounter - gave new splint - x 3-4 wk and still w/ sig swelling and pain but full extension -start ice, cont splint and buddy tape w/ 4th, start appllying voltaren gel qid since anti-inflammatory - do not want to use oral consider severe GI complications w/ recent GI surgery, new anemia, weight loss of unknown etiology  4. Chronic obstructive pulmonary disease, unspecified COPD type ([place])   5. Tobacco abuse - lung cancer screening 09/2017  6. Sprain of interphalangeal joint of left little finger, subsequent encounter   7. Nausea without vomiting - chronic but maybe a little better than nml since had cholecystectomy  8. 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 Anxiety state - Cont buspar 20mg  tid - feels like it is helping but wants something stronger (doesn't work as well as the xanax she used to be on)  10.    Postoperative anemia -  - very mild development of anemia - likely not even sig except as a smoker we would expect pt to have hgb on the higher side - some of her hgb clogged up with CO so not carrying O2 so relativelly lower - a 11.    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 12.    Underweight - reports good appetite - cont to watch as still recovering from cholecystectomy but if doesn't stablize than gain need to start cancer screening 13.    Weight loss - hopefully due to billiary sludge and now cholecstectomy - eating well - rec starting protein supplement drinks mid meals BMI Readings from Last 10 Encounters:  12/18/17 17.67 kg/m  12/02/17 17.36 kg/m  11/24/17 18.32 kg/m  10/17/17 17.85 kg/m  10/17/17 18.02 kg/m  09/10/17 17.73 kg/m  08/30/17 17.31 kg/m  08/27/17 18.99 kg/m  06/27/17 19.57 kg/m  05/31/17 19.14 kg/m   14.   Chronic nausea - refilled  chronic zofran   Orders Placed This Encounter  Procedures  . DG Chest 2 View  Standing Status:   Future    Number of Occurrences:   1    Standing Expiration Date:   12/18/2018    Order Specific Question:   Reason for Exam (SYMPTOM  OR DIAGNOSIS REQUIRED)    Answer:   fever, productive cough, tobacco use, recent surgery    Order Specific Question:   Preferred imaging location?    Answer:   External  . Comprehensive metabolic panel  . POCT urinalysis dipstick  . POCT Microscopic Urinalysis ([facility])  . POCT CBC    Meds ordered this encounter  Medications  . methylPREDNISolone acetate (DEPO-MEDROL) injection 120 mg  . amoxicillin (AMOXIL) 500 MG capsule    Sig: Take 1 capsule (500 mg total) by mouth 3 (three) times daily.    Dispense:  21 capsule    Refill:  0  . benzonatate (TESSALON) 200 MG capsule    Sig: Take 1 capsule (200 mg total) by mouth 3 (three) times daily as needed for cough.    Dispense:  30 capsule    Refill:  0  . ipratropium (ATROVENT) 0.03 % nasal spray    Sig: Place 2 sprays into the nose 4 (four) times daily.    Dispense:  30 mL    Refill:  0  . diclofenac sodium (VOLTAREN) 1 % GEL    Sig: Apply 2 g topically 4 (four) times daily. To left 5th finger    Dispense:  100 g    Refill:  0  . ondansetron (ZOFRAN-ODT) 8 MG disintegrating tablet    Sig: Take 1 tablet (8 mg total) by mouth every 8 (eight) hours as needed for nausea.    Dispense:  20 tablet    Refill:  5    I personally performed the services described in this documentation, which was scribed in my presence. The recorded information has been reviewed and considered, and addended by me as needed.   [name], M.D.  Primary Care at [facility] [address] [place], [place] [address] [phone] phone [phone] fax  12/19/17 4:43 PM

## 2017-12-19 DIAGNOSIS — J449 Chronic obstructive pulmonary disease, unspecified: Secondary | ICD-10-CM | POA: Diagnosis not present

## 2017-12-19 LAB — COMPREHENSIVE METABOLIC PANEL
ALT: 9 IU/L (ref 0–32)
AST: 13 IU/L (ref 0–40)
Albumin/Globulin Ratio: 1.9 (ref 1.2–2.2)
Albumin: 3.9 g/dL (ref 3.6–4.8)
Alkaline Phosphatase: 94 IU/L (ref 39–117)
BUN/Creatinine Ratio: 22 (ref 12–28)
BUN: 12 mg/dL (ref 8–27)
Bilirubin Total: <0.2 mg/dL (ref 0.0–1.2)
CALCIUM: 9 mg/dL (ref 8.7–10.3)
CO2: 23 mmol/L (ref 20–29)
CREATININE: 0.54 mg/dL — AB (ref 0.57–1.00)
Chloride: 104 mmol/L (ref 96–106)
GFR calc Af Amer: 115 mL/min/{1.73_m2} (ref >59)
GFR, EST NON AFRICAN AMERICAN: 99 mL/min/{1.73_m2} (ref >59)
GLUCOSE: 89 mg/dL (ref 65–99)
Globulin, Total: 2.1 g/dL (ref 1.5–4.5)
Potassium: 4.2 mmol/L (ref 3.5–5.2)
Sodium: 141 mmol/L (ref 134–144)
Total Protein: 6 g/dL (ref 6.0–8.5)

## 2017-12-19 MED ORDER — ONDANSETRON 8 MG PO TBDP: 8.0000 mg | ORAL_TABLET | Freq: Three times a day (TID) | ORAL | 5 refills | Status: DC | PRN

## 2017-12-20 ENCOUNTER — Other Ambulatory Visit: Payer: Self-pay

## 2017-12-20 ENCOUNTER — Other Ambulatory Visit: Payer: Self-pay | Admitting: Family Medicine

## 2017-12-20 MED ORDER — BUSPIRONE HCL 10 MG PO TABS: ORAL_TABLET | ORAL | 2 refills | Status: DC

## 2017-12-26 DIAGNOSIS — R300 Dysuria: Secondary | ICD-10-CM | POA: Diagnosis not present

## 2017-12-26 DIAGNOSIS — R05 Cough: Secondary | ICD-10-CM | POA: Diagnosis not present

## 2017-12-26 DIAGNOSIS — Z79899 Other long term (current) drug therapy: Secondary | ICD-10-CM | POA: Diagnosis not present

## 2017-12-26 DIAGNOSIS — J209 Acute bronchitis, unspecified: Secondary | ICD-10-CM | POA: Diagnosis not present

## 2017-12-26 DIAGNOSIS — R5383 Other fatigue: Secondary | ICD-10-CM | POA: Diagnosis not present

## 2017-12-29 ENCOUNTER — Ambulatory Visit: Payer: Medicare Other | Admitting: Family Medicine

## 2017-12-30 ENCOUNTER — Ambulatory Visit: Payer: Medicare Other | Admitting: Family Medicine

## 2017-12-31 ENCOUNTER — Ambulatory Visit: Payer: Medicare Other | Admitting: Family Medicine

## 2018-01-01 ENCOUNTER — Encounter

## 2018-01-01 ENCOUNTER — Ambulatory Visit: Payer: Medicare Other | Admitting: Urgent Care

## 2018-01-02 DIAGNOSIS — J018 Other acute sinusitis: Secondary | ICD-10-CM | POA: Diagnosis not present

## 2018-01-02 DIAGNOSIS — J40 Bronchitis, not specified as acute or chronic: Secondary | ICD-10-CM | POA: Diagnosis not present

## 2018-01-02 DIAGNOSIS — J449 Chronic obstructive pulmonary disease, unspecified: Secondary | ICD-10-CM | POA: Diagnosis not present

## 2018-01-07 DIAGNOSIS — Z79899 Other long term (current) drug therapy: Secondary | ICD-10-CM | POA: Diagnosis not present

## 2018-01-07 DIAGNOSIS — K660 Peritoneal adhesions (postprocedural) (postinfection): Secondary | ICD-10-CM | POA: Diagnosis not present

## 2018-01-12 ENCOUNTER — Ambulatory Visit: Payer: Medicare Other | Admitting: Family Medicine

## 2018-01-14 ENCOUNTER — Ambulatory Visit: Payer: Self-pay | Admitting: Endocrinology

## 2018-01-16 DIAGNOSIS — G4700 Insomnia, unspecified: Secondary | ICD-10-CM | POA: Diagnosis not present

## 2018-01-16 DIAGNOSIS — J209 Acute bronchitis, unspecified: Secondary | ICD-10-CM | POA: Diagnosis not present

## 2018-01-16 DIAGNOSIS — Z87891 Personal history of nicotine dependence: Secondary | ICD-10-CM | POA: Diagnosis not present

## 2018-01-18 DIAGNOSIS — J449 Chronic obstructive pulmonary disease, unspecified: Secondary | ICD-10-CM | POA: Diagnosis not present

## 2018-01-21 DIAGNOSIS — J40 Bronchitis, not specified as acute or chronic: Secondary | ICD-10-CM | POA: Diagnosis not present

## 2018-01-21 DIAGNOSIS — R0602 Shortness of breath: Secondary | ICD-10-CM | POA: Diagnosis not present

## 2018-01-21 DIAGNOSIS — Z87891 Personal history of nicotine dependence: Secondary | ICD-10-CM | POA: Diagnosis not present

## 2018-01-21 DIAGNOSIS — J441 Chronic obstructive pulmonary disease with (acute) exacerbation: Secondary | ICD-10-CM | POA: Diagnosis not present

## 2018-01-27 DIAGNOSIS — Z87891 Personal history of nicotine dependence: Secondary | ICD-10-CM | POA: Diagnosis not present

## 2018-01-28 ENCOUNTER — Other Ambulatory Visit: Payer: Self-pay

## 2018-01-28 ENCOUNTER — Ambulatory Visit: Payer: Medicare Other | Admitting: Family Medicine

## 2018-01-28 ENCOUNTER — Emergency Department (HOSPITAL_COMMUNITY)
Admission: EM | Admit: 2018-01-28 | Discharge: 2018-01-28 | Disposition: A | Discharge status: Home / Self Care | Payer: Medicare Other | Attending: Emergency Medicine | Admitting: Emergency Medicine

## 2018-01-28 ENCOUNTER — Encounter (HOSPITAL_COMMUNITY): Payer: Self-pay

## 2018-01-28 ENCOUNTER — Emergency Department (HOSPITAL_COMMUNITY): Payer: Medicare Other

## 2018-01-28 DIAGNOSIS — F17210 Nicotine dependence, cigarettes, uncomplicated: Secondary | ICD-10-CM | POA: Insufficient documentation

## 2018-01-28 DIAGNOSIS — T40601A Poisoning by unspecified narcotics, accidental (unintentional), initial encounter: Secondary | ICD-10-CM

## 2018-01-28 DIAGNOSIS — R4182 Altered mental status, unspecified: Secondary | ICD-10-CM | POA: Diagnosis present

## 2018-01-28 DIAGNOSIS — J449 Chronic obstructive pulmonary disease, unspecified: Secondary | ICD-10-CM | POA: Diagnosis not present

## 2018-01-28 DIAGNOSIS — J45909 Unspecified asthma, uncomplicated: Secondary | ICD-10-CM | POA: Diagnosis not present

## 2018-01-28 DIAGNOSIS — Z79899 Other long term (current) drug therapy: Secondary | ICD-10-CM | POA: Diagnosis not present

## 2018-01-28 DIAGNOSIS — T402X1A Poisoning by other opioids, accidental (unintentional), initial encounter: Secondary | ICD-10-CM | POA: Insufficient documentation

## 2018-01-28 DIAGNOSIS — T39091A Poisoning by salicylates, accidental (unintentional), initial encounter: Secondary | ICD-10-CM | POA: Diagnosis not present

## 2018-01-28 DIAGNOSIS — R112 Nausea with vomiting, unspecified: Secondary | ICD-10-CM | POA: Diagnosis not present

## 2018-01-28 DIAGNOSIS — G9389 Other specified disorders of brain: Secondary | ICD-10-CM | POA: Diagnosis not present

## 2018-01-28 DIAGNOSIS — R410 Disorientation, unspecified: Secondary | ICD-10-CM | POA: Diagnosis not present

## 2018-01-28 LAB — COMPREHENSIVE METABOLIC PANEL
ALBUMIN: 4.1 g/dL (ref 3.5–5.0)
ALT: 11 U/L (ref 0–44)
AST: 18 U/L (ref 15–41)
Alkaline Phosphatase: 88 U/L (ref 38–126)
Anion gap: 12 (ref 5–15)
BUN: 16 mg/dL (ref 8–23)
CHLORIDE: 103 mmol/L (ref 98–111)
CO2: 25 mmol/L (ref 22–32)
Calcium: 9 mg/dL (ref 8.9–10.3)
Creatinine, Ser: 0.85 mg/dL (ref 0.44–1.00)
GFR calc Af Amer: >60 mL/min (ref >60)
GLUCOSE: 82 mg/dL (ref 70–99)
POTASSIUM: 4.2 mmol/L (ref 3.5–5.1)
SODIUM: 140 mmol/L (ref 135–145)
Total Bilirubin: 0.3 mg/dL (ref 0.3–1.2)
Total Protein: 7.2 g/dL (ref 6.5–8.1)

## 2018-01-28 LAB — SALICYLATE LEVEL
SALICYLATE LVL: 30.1 mg/dL — AB (ref 2.8–30.0)
Salicylate Lvl: 25.5 mg/dL (ref 2.8–30.0)

## 2018-01-28 LAB — CBC
HCT: 39 % (ref 36.0–46.0)
HEMOGLOBIN: 12.7 g/dL (ref 12.0–15.0)
MCH: 30.5 pg (ref 26.0–34.0)
MCHC: 32.6 g/dL (ref 30.0–36.0)
MCV: 93.8 fL (ref 78.0–100.0)
Platelets: 369 10*3/uL (ref 150–400)
RBC: 4.16 MIL/uL (ref 3.87–5.11)
RDW: 15.1 % (ref 11.5–15.5)
WBC: 9.9 10*3/uL (ref 4.0–10.5)

## 2018-01-28 LAB — LIPASE, BLOOD: Lipase: 24 U/L (ref 11–51)

## 2018-01-28 LAB — ACETAMINOPHEN LEVEL: Acetaminophen (Tylenol), Serum: <10 ug/mL — ABNORMAL LOW (ref 10–30)

## 2018-01-28 LAB — ETHANOL: Alcohol, Ethyl (B): <10 mg/dL (ref <10)

## 2018-01-28 MED ORDER — SODIUM CHLORIDE 0.9 % IV BOLUS
1000.0000 mL | Freq: Once | INTRAVENOUS | Status: AC
  Administered 2018-01-28: 1000 mL via INTRAVENOUS

## 2018-01-28 MED ORDER — NALOXONE HCL 0.4 MG/ML IJ SOLN
0.4000 mg | Freq: Once | INTRAMUSCULAR | Status: AC
  Administered 2018-01-28: 0.4 mg via INTRAVENOUS
  Filled 2018-01-28: qty 1

## 2018-01-28 NOTE — ED Provider Notes (Addendum)
[facility] DEPT Provider Note   CSN: [id number] Arrival date & time: 01/28/18  1717     History   Chief Complaint Chief Complaint  Patient presents with  . Ingestion    HPI [name] is a 67 y.o. female.  [name] F w/ PMH including PUD, chronic abd pain, gastroparesis, COPD, polysubstance abuse who presents with altered mental status.  Roommate states that they have known each other for very long time and lived together for the past 8 years.  She last saw her at home in her usual state of health around 1:30 PM.  When roommate returned home, she noted that the patient was sleepy and altered.  Roommate states that the patient has a bag of medications and she thought that the patient had a bottle of hydrocodone and there.  She has not been able to find this medication.  She is not sure whether the patient took this medication this afternoon.  Patient states that she took a pain pill this morning but states that she is out of her medication and denies medication this afternoon.  She denies alcohol or drug use and patient states that although she has history of depression, she has not made any concerning comments regarding suicidal ideation recently.  LEVEL5 CAVEAT DUE TO AMS  The history is provided by the patient and a friend.  Ingestion     Past Medical History:  Diagnosis Date  . Acute duodenal ulcer with hemorrhage and perforation, with obstruction ([place]) 06/26/2004   multiple pyloric perferations  . Allergy   . Anemia   . Anxiety    sees [name] NP at Dr. Radonna Ricker office  . Asthma    hx of   . Barrett's esophagus   . Chronic abdominal pain    narcotic dependence, dr gyarteng-dak at [facility]  . 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 Complication of anesthesia    pt states had too much xanax on board - agitated   . COPD (chronic obstructive pulmonary disease) ([place])   . Depression   . Exertional  shortness of breath   . Fall 11/23/2014   frequent falls  . Fx of fibula 07-28-11   left fibula, 2 places   . Fx two ribs-open 08-24-11   left 4th and 5th  . GASTRIC OUTLET OBSTRUCTION 08/28/2007   In July 2008 she underwent laparoscopic enterolysis, Nissen fundoplication over a #18 bougie, single pledgeted suture colosure of the hiatus and a 3 suture wrap.  Lap truncal vagotomy and a loop gastrojejunostomy was performed.  This was revised in August of 2009 to a roux en Y gastrojejujostomy.     . Gastroparesis 06/03/2007   Qualifier: Diagnosis of  By: [name] MD, [name]   . GERD (gastroesophageal reflux disease)   . History of blood transfusion    "related to OR; maybe when I perforated that ulcer"  . History of hiatal hernia   . Lap Nissen + truncal vagotomy July 2008 05/13/2013  . LEG EDEMA 01/19/2008   Qualifier: Diagnosis of  By: [name] MD, [name]   . Macular degeneration, bilateral    early onset, severe, legally blind and worsening. Followed by dr. Katy Fitch  . MENOPAUSE 01/07/2007   Qualifier: Diagnosis of  By: [name], CMA, [place]    . Narcotic abuse ([place])   . Osteoporosis    severe  . Oxygen deficiency   . Oxygen dependent    3L/[place]   .  Peptic ulcer disease    dr Oletta Lamas  . Pneumonia, organism unspecified(486) 11/07/2010   Assoc with R Parapneumonic effusion 09/2010    - Tapped 10/05/10    - CxR resolved 11/07/2010    . S/P jejunostomy Feb 2016 07/26/2014  . Thyroid nodule 2013   benign after biopsy w/ Dr. Redmond Baseman    Patient Active Problem List   Diagnosis Date Noted  . Abdominal pain, chronic, epigastric 05/31/2017  . Episodic polysubstance dependence ([place]) 05/31/2017  . At high risk for injury related to fall 05/18/2017  . Chronic nausea 05/18/2017  . Poor compliance with medication 11/20/2016  . Insomnia 11/20/2016  . Early onset macular degeneration 11/20/2016  . Legally blind 11/20/2016  . Abdominal pain   . Hypoxia 04/08/2015  . Opioid type dependence, continuous ([place])   .  Protein-calorie malnutrition ([place])   . Episode of recurrent major depressive disorder ([place])   . Secondary hyperparathyroidism ([place]) 01/10/2015  . Vitamin D deficiency 01/10/2015  . 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 Anemia, iron deficiency 11/03/2014  . Chronic pain syndrome 10/24/2014  . Osteoporosis 10/05/2014  . Vertebral compression fracture ([place]) 10/05/2014  . Coronary artery disease due to calcified coronary lesion 10/05/2014  . Weight loss   . Tobacco abuse   . History of subtotal gastrectomy with Roux-en-Y recontruction   . Orthostatic hypotension 08/11/2014  . Memory loss, short term 07/18/2014  . Constipation, chronic 07/10/2013  . Chronic abdominal pain   . COPD (chronic obstructive pulmonary disease) ([place]) 11/07/2010  . Major depressive disorder, recurrent episode, moderate ([place]) 06/06/2010  . 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 Anxiety state 01/07/2007  . Barrett's esophagus 07/03/2005    Past Surgical History:  Procedure Laterality Date  . BIOPSY THYROID  08-13-11   benign nodule, per Dr. Melida Quitter   . CATARACT EXTRACTION W/ INTRAOCULAR LENS  IMPLANT, BILATERAL Bilateral   . CHOLECYSTECTOMY N/A 12/02/2017   Procedure: LAPAROSCOPIC CHOLECYSTECTOMY WITH INTRAOPERATIVE CHOLANGIOGRAM ERAS PATHWAY;  Surgeon: Johnathan Hausen, MD;  Location: [facility];  Service: General;  Laterality: N/A;  . COLONOSCOPY    . DILATION AND CURETTAGE OF UTERUS    . ESOPHAGOGASTRODUODENOSCOPY  12-29-09   dr qadeer at [facility], several gastric ulcers  . ESOPHAGOGASTRODUODENOSCOPY N/A 09/25/2012   Procedure: ESOPHAGOGASTRODUODENOSCOPY (EGD);  Surgeon: Beryle Beams, MD;  Location: [facility];  Service: Endoscopy;  Laterality: N/A;  . ESOPHAGOGASTRODUODENOSCOPY (EGD) WITH PROPOFOL N/A 06/27/2017   Procedure: ESOPHAGOGASTRODUODENOSCOPY (EGD) WITH PROPOFOL;  Surgeon: Carol Ada, MD;  Location: [facility];  Service: Endoscopy;   Laterality: N/A;  . FRACTURE SURGERY Left 2010   hip and leg s/p fall  . GASTROJEJUNOSTOMY  02/20/2008   Roux en Y gastrojejunsotomy w 1 foot Roux limb, laparotomy, takedown loop gastrojejunostomy with removal of Ntinol stent  . GASTROSTOMY N/A 07/26/2014   Procedure: LAPRASCOPIC ASSISTED OPEN PLACEMENT OF JEJUNOSTOMY TUBE;  Surgeon: Pedro Earls, MD;  Location: [facility];  Service: General;  Laterality: N/A;  . HERNIA REPAIR     "hiatal"  . JOINT REPLACEMENT    . LAPAROSCOPIC NISSEN FUNDOPLICATION  4401  . LAPAROSCOPIC PARTIAL GASTRECTOMY N/A 06/29/2013   Procedure: Gastrectomy and GJ Tube placement;  Surgeon: Pedro Earls, MD;  Location: [facility];  Service: General;  Laterality: N/A;  . ORIF FEMORAL NECK FRACTURE W/ DHS Right 03/2014  . ORIF HIP FRACTURE Left 2010  . REPAIR OF PERFORATED ULCER    . TONSILLECTOMY  1974  .  TRUNCAL VAGOTOMY  12/2006   Laparoscopic enterolysis, laparoscopic Nissen's Fundoplication, laparoscopic truncal vagotomy & loop gastrojejunostomy     OB History   None      Home Medications    Prior to Admission medications   Medication Sig Start Date End Date Taking? Authorizing Provider  albuterol (PROVENTIL HFA;VENTOLIN HFA) 108 (90 Base) MCG/ACT inhaler Inhale 2 puffs into the lungs every 4 (four) hours as needed (cough, shortness of breath or wheezing.). 08/01/15  Yes Shawnee Knapp, MD  Aspirin-Acetaminophen (GOODYS BODY PAIN PO) Take 10-20 packets by mouth daily as needed (pain).   Yes [provider]  BELSOMRA 20 MG TABS TAKE ONE TABLET AT BEDTIME. 11/03/17  Yes Shawnee Knapp, MD  busPIRone (BUSPAR) 10 MG tablet TAKE (2) TABLETS THREE TIMES DAILY. 12/20/17  Yes Shawnee Knapp, MD  diclofenac sodium (VOLTAREN) 1 % GEL Apply 2 g topically 4 (four) times daily. To left 5th finger 12/18/17  Yes Shawnee Knapp, MD  DULoxetine (CYMBALTA) 60 MG capsule Take 120 mg by mouth daily.    Yes Bulla, Donald, PA-C  HYDROcodone-acetaminophen (NORCO/VICODIN) 5-325 MG tablet   12/12/17  Yes [provider]  ipratropium (ATROVENT) 0.03 % nasal spray Place 2 sprays into the nose 4 (four) times daily. 12/18/17  Yes Shawnee Knapp, MD  ondansetron (ZOFRAN-ODT) 8 MG disintegrating tablet Take 1 tablet (8 mg total) by mouth every 8 (eight) hours as needed for nausea. 12/19/17  Yes Shawnee Knapp, MD  ranitidine (ZANTAC) 150 MG tablet Take 1 tablet (150 mg total) by mouth 2 (two) times daily. 10/17/17  Yes Street, [place], PA-C  vitamin B-12 (CYANOCOBALAMIN) 1000 MCG tablet Take 1,000 mcg by mouth 3 (three) times a week.   Yes [provider]  Vitamin D, Ergocalciferol, (DRISDOL) 50000 units CAPS capsule Take 1 capsule (50,000 Units total) by mouth every 7 (seven) days. 08/30/17  Yes Shawnee Knapp, MD  zolpidem (AMBIEN) 5 MG tablet Take 5 mg by mouth at bedtime. 01/16/18  Yes [provider]  amoxicillin (AMOXIL) 500 MG capsule Take 1 capsule (500 mg total) by mouth 3 (three) times daily. Patient not taking: Reported on 01/28/2018 12/18/17   Shawnee Knapp, MD  benzonatate (TESSALON) 200 MG capsule Take 1 capsule (200 mg total) by mouth 3 (three) times daily as needed for cough. Patient not taking: Reported on 01/28/2018 12/18/17   Shawnee Knapp, MD  oxyCODONE (OXY IR/ROXICODONE) 5 MG immediate release tablet Take 1 tablet (5 mg total) by mouth every 6 (six) hours as needed for severe pain. Patient not taking: Reported on 01/28/2018 12/02/17   Johnathan Hausen, MD  sucralfate (CARAFATE) 1 g tablet Take 1 tablet (1 g total) by mouth 4 (four) times daily -  with meals and at bedtime. Patient not taking: Reported on 01/28/2018 05/31/17   Shawnee Knapp, MD  traZODone (DESYREL) 150 MG tablet Take 1 tablet (150 mg total) by mouth at bedtime. Patient not taking: Reported on 01/28/2018 05/31/17   Shawnee Knapp, MD    Family History Family History  Problem Relation Age of Onset  . Cancer Mother        kidney/bladder cancer, magliant skin cancer on face, cervical cancer  . Stroke Mother   .  Hypertension Mother   . Celiac disease Father   . Cancer Father        metastatic renal cell carcinoma, pancreatic cancer, colon polyps  . Colon cancer Maternal Uncle   . Breast  cancer Neg Hx   . Ovarian cancer Neg Hx   . Endometrial cancer Neg Hx     Social History Social History   Tobacco Use  . Smoking status: Current Every Day Smoker    Packs/day: 1.00    Years: 51.00    Pack years: 51.00    Types: Cigarettes  . Smokeless tobacco: Never Used  Substance Use Topics  . Alcohol use: No    Alcohol/week: 0.0 oz  . Drug use: No     Allergies   Lithium; Celebrex [celecoxib]; Morphine; Quetiapine; and Varenicline tartrate   Review of Systems Review of Systems All other systems reviewed and are negative except that which was mentioned in HPI   Physical Exam Updated Vital Signs BP (!) 112/93   Pulse 81   Temp 97.9 F (36.6 C) (Oral)   Resp 15   SpO2 97%   Physical Exam  Constitutional: She appears well-developed. No distress.  Thin, chronically ill appearing, sleeping comfortably  HENT:  Head: Normocephalic and atraumatic.  Dry mucous membranes  Eyes: Pupils are equal, round, and reactive to light. Conjunctivae are normal.  Pinpoint pupils  Neck: Neck supple.  Cardiovascular: Normal rate, regular rhythm and normal heart sounds.  No murmur heard. Pulmonary/Chest: Effort normal and breath sounds normal.  Abdominal: Soft. Bowel sounds are normal. She exhibits no distension. There is no tenderness.  Musculoskeletal: She exhibits no edema.  Neurological:  Sleepy but arousable to voice, follows commands, moving all 4 extremities equally, occasional jerks of extremities, no clonus, 3+DTRs BLE  Skin: Skin is warm and dry.  Nursing note and vitals reviewed.    ED Treatments / Results  Labs (all labs ordered are listed, but only abnormal results are displayed) Labs Reviewed  ACETAMINOPHEN LEVEL - Abnormal; Notable for the following components:      Result Value     Acetaminophen (Tylenol), Serum <10 (*)    All other components within normal limits  SALICYLATE LEVEL - Abnormal; Notable for the following components:   Salicylate Lvl 81.2 (*)    All other components within normal limits  COMPREHENSIVE METABOLIC PANEL  LIPASE, BLOOD  ETHANOL  CBC  SALICYLATE LEVEL  URINALYSIS, ROUTINE W REFLEX MICROSCOPIC  RAPID URINE DRUG SCREEN, HOSP PERFORMED    EKG EKG Interpretation  Date/Time:  Wednesday January 28 2018 18:17:50 EDT Ventricular Rate:  80 PR Interval:    QRS Duration: 77 QT Interval:  342 QTC Calculation: 395 R Axis:   89 Text Interpretation:  Sinus rhythm Borderline right axis deviation ST elevation, consider inferior injury rate slower otherwise similar to previous Confirmed by Theotis Burrow [id number]) on 01/28/2018 6:40:26 PM   Radiology Ct Head Wo Contrast  Result Date: 01/28/2018 CLINICAL DATA:  Possible drug overdose.  History of substance abuse. EXAM: CT HEAD WITHOUT CONTRAST TECHNIQUE: Contiguous axial images were obtained from the base of the skull through the vertex without intravenous contrast. COMPARISON:  CT HEAD August 08, 2017 FINDINGS: BRAIN: No intraparenchymal hemorrhage, mass effect nor midline shift. The ventricles and sulci are normal for age. No acute large vascular territory infarcts. No abnormal extra-axial fluid collections. Basal cisterns are patent. VASCULAR: Mild calcific atherosclerosis of the carotid siphons. SKULL: No skull fracture. No significant scalp soft tissue swelling. SINUSES/ORBITS: Trace paranasal sinus mucosal thickening. Mastoid air cells are well aerated.The included ocular globes and orbital contents are non-suspicious. Status post bilateral ocular lens implants. OTHER: None. IMPRESSION: Negative non-contrast CT HEAD for age. Electronically Signed   By: Sandie Ano  Bloomer M.D.   On: 01/28/2018 19:48    Procedures .Critical Care Performed by: Sharlett Iles, MD Authorized by: Sharlett Iles, MD   Critical care provider statement:    Critical care time (minutes):  30   Critical care time was exclusive of:  Separately billable procedures and treating other patients   Critical care was necessary to treat or prevent imminent or life-threatening deterioration of the following conditions:  [place]   Critical care was time spent personally by me on the following activities:  Development of treatment plan with patient or surrogate, discussions with consultants, evaluation of patient's response to treatment, examination of patient, obtaining history from patient or surrogate, ordering and performing treatments and interventions, ordering and review of laboratory studies, ordering and review of radiographic studies and re-evaluation of patient's condition   (including critical care time)  Medications Ordered in ED Medications  naloxone (NARCAN) injection 0.4 mg (0.4 mg Intravenous Given 01/28/18 1820)  sodium chloride 0.9 % bolus 1,000 mL (1,000 mLs Intravenous New Bag/Given 01/28/18 2120)     Initial Impression / Assessment and Plan / ED Course  I have reviewed the triage vital signs and the nursing notes.  Pertinent labs & imaging results that were available during my care of the patient were reviewed by me and considered in my medical decision making (see chart for details).     PT sleepy but protecting airway on arrival with stable vital signs.  Gave dose of Narcan with immediate improvement in mental status.  Head CT negative acute.  Lab work notable for salicylate level of 30.  On further questioning, the patient states that she has run out of her narcotic pain medicine prescription and has been taking more aspirin this week to try to help with her pain.  She vehemently denies any intentional overdose or suicidal ideation.  I discussed with poison control and they recommended repeat levels q3-4h until peaked and IVF, no bicarb for now.   Repeat salicylate level several  hours later was within normal limits at 25.  Patient is alert and conversant on reassessment.  She has been observed for 6 hours per poison control recommendations with no acute changes in her mental status.  I have emphasized the importance of extreme caution with over-the-counter medications such as aspirin and cautioned the patient on pain medication use.  Return precautions reviewed and patient discharged in satisfactory condition.  Final Clinical Impressions(s) / ED Diagnoses   Final diagnoses:  Overdose opiate, accidental or unintentional, initial encounter [facility])  Overdose of salicylate, accidental or unintentional, initial encounter    ED Discharge Orders    None       Jaryn Rosko, Wenda Overland, MD 01/28/18 2321    Rex Kras Wenda Overland, MD 01/28/18 2322

## 2018-01-28 NOTE — Discharge Instructions (Addendum)
Return to ER if you have any new vision problems, hearing problems, vomiting, speech problems, or weakness.

## 2018-01-28 NOTE — ED Notes (Signed)
Date and time results received: 42/10/31 28:11  Test: Salicylate  Critical Value: 30.1mg /dL   Name of Provider Notified: Dr. Rex Kras via in person and Jenny Reichmann, RN via department radio.   Orders Received? Or Actions Taken?: Will continue to monitor and await for new orders.

## 2018-01-28 NOTE — ED Notes (Signed)
Patient has pure wick in place. Patient still has not voided for urine sample. Patient aware we need a sample.

## 2018-01-28 NOTE — ED Notes (Signed)
Bed: NG87 Expected date:  Expected time:  Means of arrival:  Comments: 67 yo lethargic

## 2018-01-28 NOTE — ED Triage Notes (Signed)
EMS reports from home, roommate called Pt acting very lethargic. Roommate hid Hydrocodone because Pt takes too many, states she acts like this when she takes too many. Roommate could not find hidden meds when asked to confirm med. Possible over ingestion. Pt responsive to questions.  BP 136/78 HR 88 Sp02 95 Resp 18 CBG 197

## 2018-01-28 NOTE — ED Notes (Signed)
Patient has had purewick in place but purewick did not catch the urine.

## 2018-01-28 NOTE — ED Notes (Signed)
Administered Narcan with positive effect

## 2018-01-29 ENCOUNTER — Telehealth: Payer: Self-pay | Admitting: Family Medicine

## 2018-01-29 NOTE — Telephone Encounter (Signed)
Copied from [place] [phone]. Topic: General - Other >> Jan 28, 2018  2:01 PM Yvette Rack wrote: Reason for CRM: pt room mate has cancelled  pt appt for today stating that they will take her to the hospital she has been just sitting in her bed just staring she was fine earlier now shes just staring off

## 2018-01-30 ENCOUNTER — Ambulatory Visit: Payer: Medicare Other | Admitting: Family Medicine

## 2018-01-30 ENCOUNTER — Encounter: Payer: Self-pay | Admitting: Family Medicine

## 2018-01-30 VITALS — BP 128/72 | HR 80 | Temp 99.0°F | Resp 16 | Ht 63.25 in | Wt 103.4 lb

## 2018-01-30 DIAGNOSIS — Z720 Tobacco use: Secondary | ICD-10-CM

## 2018-01-30 DIAGNOSIS — Z1211 Encounter for screening for malignant neoplasm of colon: Secondary | ICD-10-CM | POA: Diagnosis not present

## 2018-01-30 DIAGNOSIS — M818 Other osteoporosis without current pathological fracture: Secondary | ICD-10-CM

## 2018-01-30 DIAGNOSIS — Z23 Encounter for immunization: Secondary | ICD-10-CM | POA: Diagnosis not present

## 2018-01-30 DIAGNOSIS — Z9189 Other specified personal risk factors, not elsewhere classified: Secondary | ICD-10-CM

## 2018-01-30 DIAGNOSIS — J449 Chronic obstructive pulmonary disease, unspecified: Secondary | ICD-10-CM | POA: Diagnosis not present

## 2018-01-30 DIAGNOSIS — Z1231 Encounter for screening mammogram for malignant neoplasm of breast: Secondary | ICD-10-CM | POA: Diagnosis not present

## 2018-01-30 DIAGNOSIS — Z1212 Encounter for screening for malignant neoplasm of rectum: Secondary | ICD-10-CM

## 2018-01-30 DIAGNOSIS — Z79899 Other long term (current) drug therapy: Secondary | ICD-10-CM

## 2018-01-30 MED ORDER — NALOXONE HCL 4 MG/0.1ML NA LIQD: 1.0000 | Freq: Once | NASAL | 1 refills | Status: AC

## 2018-01-30 NOTE — Telephone Encounter (Signed)
Pt was seen in ER 8/7. Here to f/u 8/9.

## 2018-01-30 NOTE — Patient Instructions (Addendum)
You need to schedule your mammogram and your DEXA bone scan.  Please call [facility] at [phone] to schedule.    IF you received an x-ray today, you will receive an invoice from [facility]. Please contact [facility] at [phone] with questions or concerns regarding your invoice.   IF you received labwork today, you will receive an invoice from [place]. Please contact [facility] at [phone] with questions or concerns regarding your invoice.   Our billing staff will not be able to assist you with questions regarding bills from these companies.  You will be contacted with the lab results as soon as they are available. The fastest way to get your results is to activate your My Chart account. Instructions are located on the last page of this paperwork. If you have not heard from [place] regarding the results in 2 weeks, please contact this office.     Accidental Overdose An accidental overdose happens when a person accidentally takes too much of a substance, such as a prescription medicine, an illegal drug, or an over-the-counter medicine. The effects of an overdose can be mild, dangerous, or even deadly. What are the causes? This condition is caused by taking too much of a medicine or other substance. It often results from:  Lack of knowledge about a substance.  Using more than one substance at the same time.  An error made by the health care provider who prescribed the substance.  An error made by the pharmacist who fills the prescription order.  A lapse in memory, such as forgetting that you have already taken a dose of medicine.  Suddenly using a substance after a long period of not using it.  Substances that can cause an accidental overdose include:  Alcohol.  Medicines that treat mental problems (psychotropic medicines).  Pain medicines.  Cocaine.  Heroin.  Multivitamins that contain iron.  What increases the  risk? This condition is more likely to occur in:  Children. Children are at increased risk for an overdose even if they are given only a small amount of a substance because of their small size. Children may also be attracted to colorful pills.  Elderly adults. Elderly adults are more likely to overdose because they may be taking many different medicines. They may also have difficulty reading labels or remembering when they last took their medicine.  People who use illegal drugs.  People who drink alcohol while using drugs or certain medicines.  People with certain mental health conditions.  What are the signs or symptoms? Symptoms of this condition depend on the substance and the amount that was taken. Common symptoms include:  Behavior changes, such as confusion.  Sleepiness.  Weakness.  Slowed breathing.  Nausea and vomiting.  Seizures.  Very large or small eye pupil size.  An overdose can cause a very serious condition in which your blood pressure drops to a low level (shock). Symptoms of shock include:  Cold and clammy skin.  Pale skin.  Blue lips.  Very slow breathing.  Extreme sleepiness.  Severe confusion.  How is this diagnosed? This condition is diagnosed based on your symptoms and a physical exam. You will be asked to tell your health care provider which substances you took and when you took them. During the physical exam your health care provider may check and monitor your heart rate and rhythm, your temperature, and your blood pressure (vital signs). He or she may also check your breathing and oxygen levels. Sometimes tests are  also done to diagnose the condition. Tests may include:  Urine tests. These are done to check for substances in your system.  Blood tests. These may be done to check for: ? Substances in your system. ? Signs of an imbalance in your blood minerals (electrolytes). ? Liver damage. ? Kidney damage. ? An abnormal acid level in your  blood.  An electrocardiogram (ECG). This tests is done to monitor electrical activity in your heart.  How is this treated? This condition may need to be treated right away at the hospital. The first step in treatment is supporting your vital signs and your breathing. After that treatment may involve:  Getting fluids and electrolytes through an IV tube.  Having a breathing tube inserted in your airway to help you breathe. The breathing tube is called a endotracheal tube.  Getting medicines. These may include: ? Medicines that absorb any substance that is in your digestive system. ? Medicines that block or reverse the effect of the substance that caused the overdose.  Having your blood filtered through an artificial kidney machine (hemodialysis).  Ongoing counseling and mental health support. This may be provided if you used an illegal drug.  Follow these instructions at home: Medicines  Take over-the-counter and prescription medicines only as told by your health care provider.  Before taking a new medicine, ask your health care provider whether the medicine may cause side effects and whether the medicine might react with other medicines.  Keep a list of all of the medicines that you take, including over-the-counter medicines. Bring this list with you to all of your medical visits. General instructions  Drink enough fluid to keep your urine clear or pale yellow.  If you are working with a counselor or mental health professional, make sure to follow his or her instructions.  Keep all follow-up visits as told by your health care provider. This is important.  Limit alcohol intake to no more than 1 drink a day for nonpregnant women and 2 drinks a day for men. One drink equals 12 oz of beer, 5 oz of wine, or 1 oz of hard liquor. How is this prevented?  Get help if you are struggling with: ? Alcohol or drug use. ? Depression or another mental health problem.  Keep the phone number  of your local poison control center near your phone or on your cell phone. The hotline of the [facility] is (507)786-7836.  Store all medicines in safety containers that are out of the reach of children.  Read the drug inserts that come with your medicines.  Create a system for taking your medicine, such as with a pill box, that will help you avoid taking too much.  Do not drink alcohol while taking medicines unless your health care provider approves.  Do not use illegal drugs.  Do not take medicines that are not prescribed for you.  If you are breastfeeding, talk to your health care provider before taking medicines or other substances. Certain drugs and medicines can be passed through breast milk to your baby. Contact a health care provider if:  Your symptoms return.  You develop new symptoms or side effects after taking a medicine.  You have questions about a possible overdose. Call your local poison control center at 1-(519) 570-5694. Get help right away if:  You think that you or someone else may have taken too much of a substance.  You or someone else is having symptoms of an overdose.  You  have serious thoughts about hurting yourself or others.  You become confused.  You have chest pain.  You have trouble breathing.  You lose consciousness.  You have a seizure.  You have trouble staying awake. These symptoms may represent a serious problem that is an emergency. Do not wait to see if the symptoms will go away. Get medical help right away. Call your local emergency services (911 in the [place] Do not drive yourself to the hospital. Summary  An accidental overdose happens when a person accidentally takes too much of a medicine or other substance, such as a prescription medicine, an illegal drug, or an over-the-counter medicine.  The effects of an overdose can be mild, dangerous, or even deadly.  This condition is diagnosed based on your symptoms  and a physical exam. You will be asked to tell your health care provider which substances you took and when you took them.  This condition may need to be treated right away at the hospital. This information is not intended to replace advice given to you by your health care provider. Make sure you discuss any questions you have with your health care provider. Document Released: 08/24/2004 Document Revised: 05/23/2016 Document Reviewed: 05/23/2016 Elsevier Interactive Patient Education  2017 [employer].

## 2018-01-30 NOTE — Progress Notes (Signed)
Subjective:    Patient ID: [name], female    DOB: 1950/09/10, 67 y.o.   MRN: [id number] Chief Complaint  Patient presents with  . Follow-up    from 01/28/18 [facility] vs per pt stated that "she had taken too much medication" overdose on Goodies, roommate thought that pt was having a stroke    HPI  When she was in the ER, her salicylate level was mildly elevated (30.1 with nml high of 30.0), nml cbc, EtOH level negative, lipase nml, acetaminophen undetectable, cbc nml, head CT nml.  They did NOT do a UDS.  They did give her a dose of narcan and states her sleeping appearance immediately improved. She started crying when the dog got sick.   Darianny has an appt in 1 mo 9/9 with Dr. Loanne Drilling  Past Medical History:  Diagnosis Date  . Acute duodenal ulcer with hemorrhage and perforation, with obstruction ([place]) 06/26/2004   multiple pyloric perferations  . Allergy   . Anemia   . Anxiety    sees [name] NP at Dr. Radonna Ricker office  . Asthma    hx of   . Barrett's esophagus   . Chronic abdominal pain    narcotic dependence, dr gyarteng-dak at [facility]  . 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 Complication of anesthesia    pt states had too much xanax on board - agitated   . COPD (chronic obstructive pulmonary disease) ([place])   . Depression   . Exertional shortness of breath   . Fall 11/23/2014   frequent falls  . Fx of fibula 07-28-11   left fibula, 2 places   . Fx two ribs-open 08-24-11   left 4th and 5th  . GASTRIC OUTLET OBSTRUCTION 08/28/2007   In July 2008 she underwent laparoscopic enterolysis, Nissen fundoplication over a #35 bougie, single pledgeted suture colosure of the hiatus and a 3 suture wrap.  Lap truncal vagotomy and a loop gastrojejunostomy was performed.  This was revised in August of 2009 to a roux en Y gastrojejujostomy.     . Gastroparesis 06/03/2007   Qualifier: Diagnosis of  By: [name] MD, [name]   . GERD  (gastroesophageal reflux disease)   . History of blood transfusion    "related to OR; maybe when I perforated that ulcer"  . History of hiatal hernia   . Lap Nissen + truncal vagotomy July 2008 05/13/2013  . LEG EDEMA 01/19/2008   Qualifier: Diagnosis of  By: [name] MD, [name]   . Macular degeneration, bilateral    early onset, severe, legally blind and worsening. Followed by dr. Katy Fitch  . MENOPAUSE 01/07/2007   Qualifier: Diagnosis of  By: [name], CMA, [place]    . Narcotic abuse ([place])   . Osteoporosis    severe  . Oxygen deficiency   . Oxygen dependent    3L/[place]   . Peptic ulcer disease    dr Oletta Lamas  . Pneumonia, organism unspecified(486) 11/07/2010   Assoc with R Parapneumonic effusion 09/2010    - Tapped 10/05/10    - CxR resolved 11/07/2010    . S/P jejunostomy Feb 2016 07/26/2014  . Thyroid nodule 2013   benign after biopsy w/ Dr. Redmond Baseman   Past Surgical History:  Procedure Laterality Date  . BIOPSY THYROID  08-13-11   benign nodule, per Dr. Melida Quitter   . CATARACT EXTRACTION W/ INTRAOCULAR LENS  IMPLANT, BILATERAL Bilateral   . CHOLECYSTECTOMY N/A 12/02/2017  Procedure: LAPAROSCOPIC CHOLECYSTECTOMY WITH INTRAOPERATIVE CHOLANGIOGRAM ERAS PATHWAY;  Surgeon: Johnathan Hausen, MD;  Location: [facility];  Service: General;  Laterality: N/A;  . COLONOSCOPY    . DILATION AND CURETTAGE OF UTERUS    . ESOPHAGOGASTRODUODENOSCOPY  12-29-09   dr qadeer at [facility], several gastric ulcers  . ESOPHAGOGASTRODUODENOSCOPY N/A 09/25/2012   Procedure: ESOPHAGOGASTRODUODENOSCOPY (EGD);  Surgeon: Beryle Beams, MD;  Location: [facility];  Service: Endoscopy;  Laterality: N/A;  . ESOPHAGOGASTRODUODENOSCOPY (EGD) WITH PROPOFOL N/A 06/27/2017   Procedure: ESOPHAGOGASTRODUODENOSCOPY (EGD) WITH PROPOFOL;  Surgeon: Carol Ada, MD;  Location: [facility];  Service: Endoscopy;  Laterality: N/A;  . FRACTURE SURGERY Left 2010   hip and leg s/p fall  . GASTROJEJUNOSTOMY  02/20/2008   Roux en Y gastrojejunsotomy w 1  foot Roux limb, laparotomy, takedown loop gastrojejunostomy with removal of Ntinol stent  . GASTROSTOMY N/A 07/26/2014   Procedure: LAPRASCOPIC ASSISTED OPEN PLACEMENT OF JEJUNOSTOMY TUBE;  Surgeon: Pedro Earls, MD;  Location: [facility];  Service: General;  Laterality: N/A;  . HERNIA REPAIR     "hiatal"  . JOINT REPLACEMENT    . LAPAROSCOPIC NISSEN FUNDOPLICATION  6808  . LAPAROSCOPIC PARTIAL GASTRECTOMY N/A 06/29/2013   Procedure: Gastrectomy and GJ Tube placement;  Surgeon: Pedro Earls, MD;  Location: [facility];  Service: General;  Laterality: N/A;  . ORIF FEMORAL NECK FRACTURE W/ DHS Right 03/2014  . ORIF HIP FRACTURE Left 2010  . REPAIR OF PERFORATED ULCER    . TONSILLECTOMY  1974  . TRUNCAL VAGOTOMY  12/2006   Laparoscopic enterolysis, laparoscopic Nissen's Fundoplication, laparoscopic truncal vagotomy & loop gastrojejunostomy   Current Outpatient Medications on File Prior to Visit  Medication Sig Dispense Refill  . albuterol (PROVENTIL HFA;VENTOLIN HFA) 108 (90 Base) MCG/ACT inhaler Inhale 2 puffs into the lungs every 4 (four) hours as needed (cough, shortness of breath or wheezing.). 1 Inhaler 3  . Aspirin-Acetaminophen (GOODYS BODY PAIN PO) Take 10-20 packets by mouth daily as needed (pain).    . BELSOMRA 20 MG TABS TAKE ONE TABLET AT BEDTIME. 30 tablet 0  . benzonatate (TESSALON) 200 MG capsule Take 1 capsule (200 mg total) by mouth 3 (three) times daily as needed for cough. 30 capsule 0  . busPIRone (BUSPAR) 10 MG tablet TAKE (2) TABLETS THREE TIMES DAILY. 180 tablet 2  . DULoxetine (CYMBALTA) 60 MG capsule Take 120 mg by mouth daily.     Marland Kitchen HYDROcodone-acetaminophen (NORCO/VICODIN) 5-325 MG tablet     . ipratropium (ATROVENT) 0.03 % nasal spray Place 2 sprays into the nose 4 (four) times daily. 30 mL 0  . ondansetron (ZOFRAN-ODT) 8 MG disintegrating tablet Take 1 tablet (8 mg total) by mouth every 8 (eight) hours as needed for nausea. 20 tablet 5  . oxyCODONE (OXY IR/ROXICODONE)  5 MG immediate release tablet Take 1 tablet (5 mg total) by mouth every 6 (six) hours as needed for severe pain. 20 tablet 0  . traZODone (DESYREL) 150 MG tablet Take 1 tablet (150 mg total) by mouth at bedtime. 30 tablet 0  . vitamin B-12 (CYANOCOBALAMIN) 1000 MCG tablet Take 1,000 mcg by mouth 3 (three) times a week.    . Vitamin D, Ergocalciferol, (DRISDOL) 50000 units CAPS capsule Take 1 capsule (50,000 Units total) by mouth every 7 (seven) days. 24 capsule 1  . zolpidem (AMBIEN) 5 MG tablet Take 5 mg by mouth at bedtime.    . diclofenac sodium (VOLTAREN) 1 % GEL Apply 2 g topically 4 (four)  times daily. To left 5th finger (Patient not taking: Reported on 01/30/2018) 100 g 0  . ranitidine (ZANTAC) 150 MG tablet Take 1 tablet (150 mg total) by mouth 2 (two) times daily. (Patient not taking: Reported on 01/30/2018) 60 tablet 0  . sucralfate (CARAFATE) 1 g tablet Take 1 tablet (1 g total) by mouth 4 (four) times daily -  with meals and at bedtime. (Patient not taking: Reported on 01/28/2018) 120 tablet 0   No current facility-administered medications on file prior to visit.    Allergies  Allergen Reactions  . Lithium Nausea And Vomiting  . Celebrex [Celecoxib] Other (See Comments)    History of bleeding ulcers  . Morphine Itching  . Quetiapine Other (See Comments)     tardive dyskinesia  . Varenicline Tartrate Other (See Comments)    hallucinations   Family History  Problem Relation Age of Onset  . Cancer Mother        kidney/bladder cancer, magliant skin cancer on face, cervical cancer  . Stroke Mother   . Hypertension Mother   . Celiac disease Father   . Cancer Father        metastatic renal cell carcinoma, pancreatic cancer, colon polyps  . Colon cancer Maternal Uncle   . Breast cancer Neg Hx   . Ovarian cancer Neg Hx   . Endometrial cancer Neg Hx    Social History   Socioeconomic History  . Marital status: Divorced    Spouse name: Not on file  . Number of children: 0  . Years  of education: Not on file  . Highest education level: Not on file  Occupational History  . Not on file  Social Needs  . Financial resource strain: Not on file  . Food insecurity:    Worry: Not on file    Inability: Not on file  . Transportation needs:    Medical: Not on file    Non-medical: Not on file  Tobacco Use  . Smoking status: Current Every Day Smoker    Packs/day: 1.00    Years: 51.00    Pack years: 51.00    Types: Cigarettes  . Smokeless tobacco: Never Used  Substance and Sexual Activity  . Alcohol use: No    Alcohol/week: 0.0 standard drinks  . Drug use: No  . Sexual activity: Never  Lifestyle  . Physical activity:    Days per week: Not on file    Minutes per session: Not on file  . Stress: Not on file  Relationships  . Social connections:    Talks on phone: Not on file    Gets together: Not on file    Attends religious service: Not on file    Active member of club or organization: Not on file    Attends meetings of clubs or organizations: Not on file    Relationship status: Not on file  Other Topics Concern  . Not on file  Social History Narrative  . Not on file   Depression screen [facility] 2/9 12/18/2017 09/10/2017 08/30/2017 08/27/2017 05/31/2017  Decreased Interest 0 0 0 0 0  Down, Depressed, Hopeless 0 0 0 0 0  PHQ - 2 Score 0 0 0 0 0  Altered sleeping - - - - -  Tired, decreased energy - - - - -  Change in appetite - - - - -  Feeling bad or failure about yourself  - - - - -  Trouble concentrating - - - - -  Moving slowly  or fidgety/restless - - - - -  Suicidal thoughts - - - - -  PHQ-9 Score - - - - -  Difficult doing work/chores - - - - -  Some recent data might be hidden     Review of Systems See hpi    Objective:   Physical Exam  Constitutional: She is oriented to person, place, and time. She appears well-developed. She appears cachectic.  Non-toxic appearance. She has a sickly appearance. No distress.  HENT:  Head: Normocephalic and  atraumatic.  Right Ear: Tympanic membrane, external ear and ear canal normal.  Left Ear: Tympanic membrane, external ear and ear canal normal.  Nose: Nose normal. No mucosal edema or rhinorrhea.  Mouth/Throat: Uvula is midline, oropharynx is clear and moist and mucous membranes are normal. No oropharyngeal exudate.  Eyes: Conjunctivae are normal. Right eye exhibits no discharge. Left eye exhibits no discharge. No scleral icterus.  Neck: Normal range of motion. Neck supple.  Cardiovascular: Normal rate, regular rhythm, normal heart sounds and intact distal pulses.  Pulmonary/Chest: Effort normal and breath sounds normal.  Abdominal: Soft. Bowel sounds are normal. She exhibits no distension and no mass. There is no tenderness. There is no rebound and no guarding.  Lymphadenopathy:    She has no cervical adenopathy.  Neurological: She is alert and oriented to person, place, and time.  Skin: Skin is warm and dry. She is not diaphoretic. No erythema.  Psychiatric: She has a normal mood and affect. Her behavior is normal.   BP 128/72 (BP Location: Left Arm, Patient Position: Sitting, Cuff Size: Normal)   Pulse 80   Temp 99 F (37.2 C) (Oral)   Resp 16   Ht 5' 3.25" (1.607 m)   Wt 103 lb 6.4 oz (46.9 kg)   SpO2 95%   BMI 18.17 kg/m      Assessment & Plan:   1. Chronic obstructive pulmonary disease, unspecified COPD type ([place])   2. Screening for colorectal cancer   3. Encounter for screening mammogram for breast cancer   4. Other osteoporosis without current pathological fracture   5. Polypharmacy   6. At high risk for adverse medication event   7. Tobacco abuse     Orders Placed This Encounter  Procedures  . Pneumococcal conjugate vaccine 13-valent IM  . Cologuard    Meds ordered this encounter  Medications  . naloxone (NARCAN) nasal spray 4 mg/0.1 mL    Sig: Place 1 spray into the nose once for 1 dose. If pt overly sedated    Dispense:  1 kit    Refill:  1     [name], M.D.  Primary Care at [facility] [address] [place], [place] [address] [phone] phone [phone] fax  01/30/18 12:50 PM

## 2018-02-04 DIAGNOSIS — K660 Peritoneal adhesions (postprocedural) (postinfection): Secondary | ICD-10-CM | POA: Diagnosis not present

## 2018-02-04 DIAGNOSIS — Z79899 Other long term (current) drug therapy: Secondary | ICD-10-CM | POA: Diagnosis not present

## 2018-02-09 ENCOUNTER — Telehealth: Payer: Self-pay | Admitting: Family Medicine

## 2018-02-09 NOTE — Telephone Encounter (Signed)
Copied from [place] [id number]. Topic: Quick Communication - See Telephone Encounter >> Feb 09, 2018  2:07 PM Hewitt Shorts wrote: Pt states that she is wanting Dr. Brigitte Pulse to write her an rx for [facility] nutrition drink -she states she is not feeling the best to day so if she could write it and then put it up from at 102 she will come and get it  Either tomorrow or Wednesday   Best [phone]

## 2018-02-10 ENCOUNTER — Ambulatory Visit: Payer: Self-pay | Admitting: Family Medicine

## 2018-02-10 NOTE — Telephone Encounter (Signed)
Please advise 

## 2018-02-17 DIAGNOSIS — R110 Nausea: Secondary | ICD-10-CM | POA: Diagnosis not present

## 2018-02-17 DIAGNOSIS — G4700 Insomnia, unspecified: Secondary | ICD-10-CM | POA: Diagnosis not present

## 2018-02-17 DIAGNOSIS — K219 Gastro-esophageal reflux disease without esophagitis: Secondary | ICD-10-CM | POA: Diagnosis not present

## 2018-02-18 DIAGNOSIS — J449 Chronic obstructive pulmonary disease, unspecified: Secondary | ICD-10-CM | POA: Diagnosis not present

## 2018-03-02 ENCOUNTER — Ambulatory Visit: Payer: Self-pay | Admitting: Endocrinology

## 2018-03-02 DIAGNOSIS — R110 Nausea: Secondary | ICD-10-CM | POA: Diagnosis not present

## 2018-03-02 DIAGNOSIS — G4700 Insomnia, unspecified: Secondary | ICD-10-CM | POA: Diagnosis not present

## 2018-03-02 DIAGNOSIS — Z79899 Other long term (current) drug therapy: Secondary | ICD-10-CM | POA: Diagnosis not present

## 2018-03-02 DIAGNOSIS — R1084 Generalized abdominal pain: Secondary | ICD-10-CM | POA: Diagnosis not present

## 2018-03-10 ENCOUNTER — Encounter: Payer: Self-pay | Admitting: Family Medicine

## 2018-03-16 ENCOUNTER — Ambulatory Visit: Payer: Self-pay | Admitting: Endocrinology

## 2018-03-17 ENCOUNTER — Ambulatory Visit: Payer: Self-pay

## 2018-03-17 NOTE — Telephone Encounter (Signed)
Pt. Reports she ate a Jimmie Dean breakfast sandwich yesterday that "was old." Immediately had stomach cramping and nausea. No vomiting or diarrhea. Temp. 99 by mouth. Hurts "right above my belly button." Has tried Pepto without relief. Would like to try home remedies x 1 more day. Instructed to drink clear liquids. Eat saltines, toast or try white rice.Instructed if cramping worsen to call back. Verbalizes understanding. Reason for Disposition . Unexplained nausea  Answer Assessment - Initial Assessment Questions 1. NAUSEA SEVERITY: "How bad is the nausea?" (e.g., mild, moderate, severe; dehydration, weight loss)   - MILD: loss of appetite without change in eating habits   - MODERATE: decreased oral intake without significant weight loss, dehydration, or malnutrition   - SEVERE: inadequate caloric or fluid intake, significant weight loss, symptoms of dehydration     Severe 2. ONSET: "When did the nausea begin?"     Yesterday after eating Jimmie Dean breakfast sandwich 3. VOMITING: "Any vomiting?" If so, ask: "How many times today?"     No 4. RECURRENT SYMPTOM: "Have you had nausea before?" If so, ask: "When was the last time?" "What happened that time?"     This is the worst 5. CAUSE: "What do you think is causing the nausea?"     What I ate 6. PREGNANCY: "Is there any chance you are pregnant?" (e.g., unprotected intercourse, missed birth control pill, broken condom)     No  Protocols used: NAUSEA-A-AH

## 2018-03-20 DIAGNOSIS — K30 Functional dyspepsia: Secondary | ICD-10-CM | POA: Diagnosis not present

## 2018-03-20 DIAGNOSIS — R1084 Generalized abdominal pain: Secondary | ICD-10-CM | POA: Diagnosis not present

## 2018-03-20 DIAGNOSIS — G4700 Insomnia, unspecified: Secondary | ICD-10-CM | POA: Diagnosis not present

## 2018-03-20 DIAGNOSIS — R110 Nausea: Secondary | ICD-10-CM | POA: Diagnosis not present

## 2018-03-21 DIAGNOSIS — J449 Chronic obstructive pulmonary disease, unspecified: Secondary | ICD-10-CM | POA: Diagnosis not present

## 2018-03-23 DIAGNOSIS — R110 Nausea: Secondary | ICD-10-CM | POA: Diagnosis not present

## 2018-03-23 DIAGNOSIS — E162 Hypoglycemia, unspecified: Secondary | ICD-10-CM | POA: Diagnosis not present

## 2018-03-23 DIAGNOSIS — Z87891 Personal history of nicotine dependence: Secondary | ICD-10-CM | POA: Diagnosis not present

## 2018-03-23 DIAGNOSIS — R1084 Generalized abdominal pain: Secondary | ICD-10-CM | POA: Diagnosis not present

## 2018-03-23 DIAGNOSIS — E860 Dehydration: Secondary | ICD-10-CM | POA: Diagnosis not present

## 2018-03-26 ENCOUNTER — Ambulatory Visit: Payer: Self-pay

## 2018-03-26 ENCOUNTER — Emergency Department (HOSPITAL_COMMUNITY): Payer: Medicare Other

## 2018-03-26 ENCOUNTER — Other Ambulatory Visit: Payer: Self-pay

## 2018-03-26 ENCOUNTER — Emergency Department (HOSPITAL_COMMUNITY)
Admission: EM | Admit: 2018-03-26 | Discharge: 2018-03-26 | Disposition: A | Discharge status: Home / Self Care | Payer: Medicare Other | Attending: Emergency Medicine | Admitting: Emergency Medicine

## 2018-03-26 ENCOUNTER — Ambulatory Visit: Payer: Self-pay | Admitting: Emergency Medicine

## 2018-03-26 DIAGNOSIS — Z79899 Other long term (current) drug therapy: Secondary | ICD-10-CM | POA: Insufficient documentation

## 2018-03-26 DIAGNOSIS — G8929 Other chronic pain: Secondary | ICD-10-CM | POA: Diagnosis not present

## 2018-03-26 DIAGNOSIS — R531 Weakness: Secondary | ICD-10-CM | POA: Diagnosis not present

## 2018-03-26 DIAGNOSIS — R1084 Generalized abdominal pain: Secondary | ICD-10-CM | POA: Diagnosis not present

## 2018-03-26 DIAGNOSIS — R110 Nausea: Secondary | ICD-10-CM | POA: Diagnosis not present

## 2018-03-26 DIAGNOSIS — Z9981 Dependence on supplemental oxygen: Secondary | ICD-10-CM | POA: Diagnosis not present

## 2018-03-26 DIAGNOSIS — E162 Hypoglycemia, unspecified: Secondary | ICD-10-CM | POA: Diagnosis not present

## 2018-03-26 DIAGNOSIS — F17210 Nicotine dependence, cigarettes, uncomplicated: Secondary | ICD-10-CM | POA: Insufficient documentation

## 2018-03-26 DIAGNOSIS — R109 Unspecified abdominal pain: Secondary | ICD-10-CM

## 2018-03-26 DIAGNOSIS — J449 Chronic obstructive pulmonary disease, unspecified: Secondary | ICD-10-CM | POA: Insufficient documentation

## 2018-03-26 DIAGNOSIS — R5383 Other fatigue: Secondary | ICD-10-CM | POA: Diagnosis not present

## 2018-03-26 DIAGNOSIS — W19XXXA Unspecified fall, initial encounter: Secondary | ICD-10-CM | POA: Diagnosis not present

## 2018-03-26 DIAGNOSIS — Y92009 Unspecified place in unspecified non-institutional (private) residence as the place of occurrence of the external cause: Secondary | ICD-10-CM | POA: Diagnosis not present

## 2018-03-26 LAB — COMPREHENSIVE METABOLIC PANEL
ALT: 13 U/L (ref 0–44)
AST: 22 U/L (ref 15–41)
Albumin: 3.1 g/dL — ABNORMAL LOW (ref 3.5–5.0)
Alkaline Phosphatase: 104 U/L (ref 38–126)
Anion gap: 12 (ref 5–15)
BILIRUBIN TOTAL: 0.5 mg/dL (ref 0.3–1.2)
BUN: 10 mg/dL (ref 8–23)
CHLORIDE: 105 mmol/L (ref 98–111)
CO2: 22 mmol/L (ref 22–32)
CREATININE: 0.8 mg/dL (ref 0.44–1.00)
Calcium: 8.5 mg/dL — ABNORMAL LOW (ref 8.9–10.3)
GFR calc Af Amer: >60 mL/min (ref >60)
Glucose, Bld: 76 mg/dL (ref 70–99)
Potassium: 5.2 mmol/L — ABNORMAL HIGH (ref 3.5–5.1)
Sodium: 139 mmol/L (ref 135–145)
TOTAL PROTEIN: 5.6 g/dL — AB (ref 6.5–8.1)

## 2018-03-26 LAB — URINALYSIS, ROUTINE W REFLEX MICROSCOPIC
Bilirubin Urine: NEGATIVE
GLUCOSE, UA: NEGATIVE mg/dL
Hgb urine dipstick: NEGATIVE
Ketones, ur: 20 mg/dL — AB
LEUKOCYTES UA: NEGATIVE
Nitrite: NEGATIVE
PROTEIN: NEGATIVE mg/dL
Specific Gravity, Urine: 1.015 (ref 1.005–1.030)
pH: 5 (ref 5.0–8.0)

## 2018-03-26 LAB — CBC
HCT: 40.5 % (ref 36.0–46.0)
Hemoglobin: 12.5 g/dL (ref 12.0–15.0)
MCH: 29.8 pg (ref 26.0–34.0)
MCHC: 30.9 g/dL (ref 30.0–36.0)
MCV: 96.7 fL (ref 78.0–100.0)
Platelets: 369 10*3/uL (ref 150–400)
RBC: 4.19 MIL/uL (ref 3.87–5.11)
RDW: 14.2 % (ref 11.5–15.5)
WBC: 7.7 10*3/uL (ref 4.0–10.5)

## 2018-03-26 LAB — LIPASE, BLOOD: Lipase: 17 U/L (ref 11–51)

## 2018-03-26 MED ORDER — SODIUM CHLORIDE 0.9 % IV BOLUS
1000.0000 mL | Freq: Once | INTRAVENOUS | Status: AC
  Administered 2018-03-26: 1000 mL via INTRAVENOUS

## 2018-03-26 NOTE — ED Notes (Signed)
Discharge instructions discussed with Pt. Pt verbalized understanding. Pt stable and ambulatory.    

## 2018-03-26 NOTE — ED Notes (Signed)
Patient transported to X-ray 

## 2018-03-26 NOTE — ED Provider Notes (Signed)
[facility]ERG[facility] DEPARTMENT Provider Note   CSN: [id number] Arrival date & time: 03/26/18  1103     History   Chief Complaint Chief Complaint  Patient presents with  . Abdominal Pain  . Fatigue    HPI SOLEDAD BUDREAU is a 67 y.o. female.  HPI 67 year old female with a history of chronic abdominal pain and chronic opioid use for her chronic abdominal pain presents the emergency department with generalized abdominal pain nausea and generalized fatigue.  She states she takes Zofran most every day.  Her nausea today was present but improved with Zofran.  Her abdominal pain was more difficult to control at home with her home oxycodone.  She denies fevers and chills.  No dysuria urinary frequency.  Symptoms are mild to moderate in severity.  No focality to her pain.  She reports generalized abdominal pain.  No back or flank pain.  No chest pain or shortness of breath.   Past Medical History:  Diagnosis Date  . Acute duodenal ulcer with hemorrhage and perforation, with obstruction ([place]) 06/26/2004   multiple pyloric perferations  . Allergy   . Anemia   . Anxiety    sees [name] NP at Dr. Radonna Ricker office  . Asthma    hx of   . Barrett's esophagus   . Chronic abdominal pain    narcotic dependence, dr gyarteng-dak at [facility]  . 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 Complication of anesthesia    pt states had too much xanax on board - agitated   . COPD (chronic obstructive pulmonary disease) ([place])   . Depression   . Exertional shortness of breath   . Fall 11/23/2014   frequent falls  . Fx of fibula 07-28-11   left fibula, 2 places   . Fx two ribs-open 08-24-11   left 4th and 5th  . GASTRIC OUTLET OBSTRUCTION 08/28/2007   In July 2008 she underwent laparoscopic enterolysis, Nissen fundoplication over a #73 bougie, single pledgeted suture colosure of the hiatus and a 3 suture wrap.  Lap truncal vagotomy and a loop  gastrojejunostomy was performed.  This was revised in August of 2009 to a roux en Y gastrojejujostomy.     . Gastroparesis 06/03/2007   Qualifier: Diagnosis of  By: [name] MD, [name]   . GERD (gastroesophageal reflux disease)   . History of blood transfusion    "related to OR; maybe when I perforated that ulcer"  . History of hiatal hernia   . Lap Nissen + truncal vagotomy July 2008 05/13/2013  . LEG EDEMA 01/19/2008   Qualifier: Diagnosis of  By: [name] MD, [name]   . Macular degeneration, bilateral    early onset, severe, legally blind and worsening. Followed by dr. Katy Fitch  . MENOPAUSE 01/07/2007   Qualifier: Diagnosis of  By: [name], CMA, [place]    . Narcotic abuse ([place])   . Osteoporosis    severe  . Oxygen deficiency   . Oxygen dependent    3L/[place]   . Peptic ulcer disease    dr Oletta Lamas  . Pneumonia, organism unspecified(486) 11/07/2010   Assoc with R Parapneumonic effusion 09/2010    - Tapped 10/05/10    - CxR resolved 11/07/2010    . S/P jejunostomy Feb 2016 07/26/2014  . Thyroid nodule 2013   benign after biopsy w/ Dr. Redmond Baseman    Patient Active Problem List   Diagnosis Date Noted  . Abdominal pain, chronic, epigastric 05/31/2017  .  Episodic polysubstance dependence ([place]) 05/31/2017  . At high risk for injury related to fall 05/18/2017  . Chronic nausea 05/18/2017  . Poor compliance with medication 11/20/2016  . Insomnia 11/20/2016  . Early onset macular degeneration 11/20/2016  . Legally blind 11/20/2016  . Abdominal pain   . Hypoxia 04/08/2015  . Opioid type dependence, continuous ([place])   . Protein-calorie malnutrition ([place])   . Episode of recurrent major depressive disorder ([place])   . Secondary hyperparathyroidism ([place]) 01/10/2015  . Vitamin D deficiency 01/10/2015  . 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 Anemia, iron deficiency 11/03/2014  . Chronic pain syndrome 10/24/2014  . Osteoporosis 10/05/2014  . Vertebral compression fracture ([place]) 10/05/2014  .  Coronary artery disease due to calcified coronary lesion 10/05/2014  . Weight loss   . Tobacco abuse   . History of subtotal gastrectomy with Roux-en-Y recontruction   . Orthostatic hypotension 08/11/2014  . Memory loss, short term 07/18/2014  . Constipation, chronic 07/10/2013  . Chronic abdominal pain   . COPD (chronic obstructive pulmonary disease) ([place]) 11/07/2010  . Major depressive disorder, recurrent episode, moderate ([place]) 06/06/2010  . 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 Anxiety state 01/07/2007  . Barrett's esophagus 07/03/2005    Past Surgical History:  Procedure Laterality Date  . BIOPSY THYROID  08-13-11   benign nodule, per Dr. Melida Quitter   . CATARACT EXTRACTION W/ INTRAOCULAR LENS  IMPLANT, BILATERAL Bilateral   . CHOLECYSTECTOMY N/A 12/02/2017   Procedure: LAPAROSCOPIC CHOLECYSTECTOMY WITH INTRAOPERATIVE CHOLANGIOGRAM ERAS PATHWAY;  Surgeon: Johnathan Hausen, MD;  Location: [facility];  Service: General;  Laterality: N/A;  . COLONOSCOPY    . DILATION AND CURETTAGE OF UTERUS    . ESOPHAGOGASTRODUODENOSCOPY  12-29-09   dr qadeer at [facility], several gastric ulcers  . ESOPHAGOGASTRODUODENOSCOPY N/A 09/25/2012   Procedure: ESOPHAGOGASTRODUODENOSCOPY (EGD);  Surgeon: Beryle Beams, MD;  Location: [facility];  Service: Endoscopy;  Laterality: N/A;  . ESOPHAGOGASTRODUODENOSCOPY (EGD) WITH PROPOFOL N/A 06/27/2017   Procedure: ESOPHAGOGASTRODUODENOSCOPY (EGD) WITH PROPOFOL;  Surgeon: Carol Ada, MD;  Location: [facility];  Service: Endoscopy;  Laterality: N/A;  . FRACTURE SURGERY Left 2010   hip and leg s/p fall  . GASTROJEJUNOSTOMY  02/20/2008   Roux en Y gastrojejunsotomy w 1 foot Roux limb, laparotomy, takedown loop gastrojejunostomy with removal of Ntinol stent  . GASTROSTOMY N/A 07/26/2014   Procedure: LAPRASCOPIC ASSISTED OPEN PLACEMENT OF JEJUNOSTOMY TUBE;  Surgeon: Pedro Earls, MD;  Location: [facility];  Service:  General;  Laterality: N/A;  . HERNIA REPAIR     "hiatal"  . JOINT REPLACEMENT    . LAPAROSCOPIC NISSEN FUNDOPLICATION  8338  . LAPAROSCOPIC PARTIAL GASTRECTOMY N/A 06/29/2013   Procedure: Gastrectomy and GJ Tube placement;  Surgeon: Pedro Earls, MD;  Location: [facility];  Service: General;  Laterality: N/A;  . ORIF FEMORAL NECK FRACTURE W/ DHS Right 03/2014  . ORIF HIP FRACTURE Left 2010  . REPAIR OF PERFORATED ULCER    . TONSILLECTOMY  1974  . TRUNCAL VAGOTOMY  12/2006   Laparoscopic enterolysis, laparoscopic Nissen's Fundoplication, laparoscopic truncal vagotomy & loop gastrojejunostomy     OB History   None      Home Medications    Prior to Admission medications   Medication Sig Start Date End Date Taking? Authorizing Provider  albuterol (PROVENTIL HFA;VENTOLIN HFA) 108 (90 Base) MCG/ACT inhaler Inhale 2 puffs into the lungs every 4 (four) hours as needed (cough, shortness of breath or  wheezing.). 08/01/15  Yes Shawnee Knapp, MD  Aspirin-Acetaminophen (GOODYS BODY PAIN PO) Take 10-20 packets by mouth daily as needed (pain).   Yes [provider]  BELSOMRA 20 MG TABS TAKE ONE TABLET AT BEDTIME. Patient taking differently: Take 20 mg by mouth daily.  11/03/17  Yes Shawnee Knapp, MD  benzonatate (TESSALON) 200 MG capsule Take 1 capsule (200 mg total) by mouth 3 (three) times daily as needed for cough. 12/18/17  Yes Shawnee Knapp, MD  busPIRone (BUSPAR) 10 MG tablet TAKE (2) TABLETS THREE TIMES DAILY. Patient taking differently: Take 10 mg by mouth 3 (three) times daily.  12/20/17  Yes Shawnee Knapp, MD  DULoxetine (CYMBALTA) 30 MG capsule Take 90 mg by mouth daily. Per patients friend - patient is taking 60mg  in the morning, and 30 mg in the evening for a total of 90mg  daily.   Yes [provider]  ipratropium (ATROVENT) 0.03 % nasal spray Place 2 sprays into the nose 4 (four) times daily. 12/18/17  Yes Shawnee Knapp, MD  ondansetron (ZOFRAN-ODT) 8 MG disintegrating tablet Take 1  tablet (8 mg total) by mouth every 8 (eight) hours as needed for nausea. 12/19/17  Yes Shawnee Knapp, MD  oxyCODONE (OXY IR/ROXICODONE) 5 MG immediate release tablet Take 1 tablet (5 mg total) by mouth every 6 (six) hours as needed for severe pain. 12/02/17  Yes Johnathan Hausen, MD  vitamin B-12 (CYANOCOBALAMIN) 1000 MCG tablet Take 1,000 mcg by mouth 3 (three) times a week.   Yes [provider]  diclofenac sodium (VOLTAREN) 1 % GEL Apply 2 g topically 4 (four) times daily. To left 5th finger Patient not taking: Reported on 01/30/2018 12/18/17   Shawnee Knapp, MD  DULoxetine (CYMBALTA) 60 MG capsule Take 120 mg by mouth daily.     Bulla, Donald, PA-C  HYDROcodone-acetaminophen (NORCO/VICODIN) 5-325 MG tablet Take 1-2 tablets by mouth every 4 (four) hours as needed for moderate pain.  12/12/17   [provider]  ranitidine (ZANTAC) 150 MG tablet Take 1 tablet (150 mg total) by mouth 2 (two) times daily. Patient not taking: Reported on 01/30/2018 10/17/17   Street, [place], PA-C  sucralfate (CARAFATE) 1 g tablet Take 1 tablet (1 g total) by mouth 4 (four) times daily -  with meals and at bedtime. Patient not taking: Reported on 01/28/2018 05/31/17   Shawnee Knapp, MD  traZODone (DESYREL) 150 MG tablet Take 1 tablet (150 mg total) by mouth at bedtime. Patient not taking: Reported on 03/26/2018 05/31/17   Shawnee Knapp, MD  Vitamin D, Ergocalciferol, (DRISDOL) 50000 units CAPS capsule Take 1 capsule (50,000 Units total) by mouth every 7 (seven) days. Patient not taking: Reported on 03/26/2018 08/30/17   Shawnee Knapp, MD  zolpidem (AMBIEN) 5 MG tablet Take 5 mg by mouth at bedtime. 01/16/18   [provider]    Family History Family History  Problem Relation Age of Onset  . Cancer Mother        kidney/bladder cancer, magliant skin cancer on face, cervical cancer  . Stroke Mother   . Hypertension Mother   . Celiac disease Father   . Cancer Father        metastatic renal cell carcinoma,  pancreatic cancer, colon polyps  . Colon cancer Maternal Uncle   . Breast cancer Neg Hx   . Ovarian cancer Neg Hx   . Endometrial cancer Neg Hx     Social History Social History  Tobacco Use  . Smoking status: Current Every Day Smoker    Packs/day: 1.00    Years: 51.00    Pack years: 51.00    Types: Cigarettes  . Smokeless tobacco: Never Used  Substance Use Topics  . Alcohol use: No    Alcohol/week: 0.0 standard drinks  . Drug use: No     Allergies   Lithium; Celebrex [celecoxib]; Morphine; Quetiapine; and Varenicline tartrate   Review of Systems Review of Systems  All other systems reviewed and are negative.    Physical Exam Updated Vital Signs BP 131/66   Pulse 84   Temp 98.4 F (36.9 C) (Oral)   Resp 20   Ht 5' 4.5" (1.638 m)   Wt 46.7 kg   SpO2 97%   BMI 17.41 kg/m   Physical Exam  Constitutional: She is oriented to person, place, and time. She appears well-developed and well-nourished. No distress.  HENT:  Head: Normocephalic and atraumatic.  Eyes: EOM are normal.  Neck: Normal range of motion.  Cardiovascular: Normal rate, regular rhythm and normal heart sounds.  Pulmonary/Chest: Effort normal and breath sounds normal.  Abdominal: Soft. She exhibits no distension. There is no tenderness.  Musculoskeletal: Normal range of motion.  Neurological: She is alert and oriented to person, place, and time.  Skin: Skin is warm and dry.  Psychiatric: She has a normal mood and affect. Judgment normal.  Nursing note and vitals reviewed.    ED Treatments / Results  Labs (all labs ordered are listed, but only abnormal results are displayed) Labs Reviewed  COMPREHENSIVE METABOLIC PANEL - Abnormal; Notable for the following components:      Result Value   Potassium 5.2 (*)    Calcium 8.5 (*)    Total Protein 5.6 (*)    Albumin 3.1 (*)    All other components within normal limits  URINALYSIS, ROUTINE W REFLEX MICROSCOPIC - Abnormal; Notable for the  following components:   Ketones, ur 20 (*)    All other components within normal limits  LIPASE, BLOOD  CBC    EKG None  Radiology Dg Abdomen Acute W/chest  Result Date: 03/26/2018 CLINICAL DATA:  Acute generalized abdominal pain. EXAM: DG ABDOMEN ACUTE W/ 1V CHEST COMPARISON:  Radiographs of December 18, 2017. FINDINGS: There is no evidence of dilated bowel loops or free intraperitoneal air. No radiopaque calculi or other significant radiographic abnormality is seen. Heart size and mediastinal contours are within normal limits. Both lungs are clear. IMPRESSION: No evidence of bowel obstruction or ileus. No acute cardiopulmonary disease. Electronically Signed   By: Marijo Conception, M.D.   On: 03/26/2018 13:24    Procedures Procedures (including critical care time)  Medications Ordered in ED Medications  sodium chloride 0.9 % bolus 1,000 mL (0 mLs Intravenous Stopped 03/26/18 1553)     Initial Impression / Assessment and Plan / ED Course  I have reviewed the triage vital signs and the nursing notes.  Pertinent labs & imaging results that were available during my care of the patient were reviewed by me and considered in my medical decision making (see chart for details).     Patient feels better after IV fluids.  Reassuring abdominal exam.  No focal tenderness.  Work-up in the emergency department including labs, urine, x-ray without acute abnormality.  Close primary care follow-up.  Patient is encouraged to return to the ER for new or worsening symptoms  Final Clinical Impressions(s) / ED Diagnoses   Final diagnoses:  Generalized weakness  Chronic abdominal pain    ED Discharge Orders    None       Jola Schmidt, MD 03/26/18 1729

## 2018-03-26 NOTE — Telephone Encounter (Signed)
Pt.'s room mate, "[name]" called to report pt. Has had weakness x 2 days. Reports she fell at home last night - no injury. States she "just started Belsomra this week" from another provider.Appointment made for today. No availability with Dr. Brigitte Pulse.  Reason for Disposition . [1] MODERATE weakness (i.e., interferes with work, school, normal activities) AND [2] persists > 3 days  Answer Assessment - Initial Assessment Questions 1. DESCRIPTION: "Describe how you are feeling."     Weak 2. SEVERITY: "How bad is it?"  "Can you stand and walk?"   - MILD - Feels weak or tired, but does not interfere with work, school or normal activities   - [place] to stand and walk; weakness interferes with work, school, or normal activities   - SEVERE - Unable to stand or walk     Moderate 3. ONSET:  "When did the weakness begin?"     Started yesterday 4. CAUSE: "What do you think is causing the weakness?"     Unsure 5. MEDICINES: "Have you recently started a new medicine or had a change in the amount of a medicine?"     "[place]" 6. OTHER SYMPTOMS: "Do you have any other symptoms?" (e.g., chest pain, fever, cough, SOB, vomiting, diarrhea, bleeding, other areas of pain)     Tremors 7. PREGNANCY: "Is there any chance you are pregnant?" "When was your last menstrual period?"     No  Protocols used: WEAKNESS (GENERALIZED) AND FATIGUE-A-AH

## 2018-03-26 NOTE — ED Triage Notes (Addendum)
Onset one day ago developed general abdominal pain with nausea and general fatigue. States have been also falling. Alert answering and following commands appropriate. Patient stated went to the doctor today sent to the ED for evaluation.

## 2018-03-27 DIAGNOSIS — K660 Peritoneal adhesions (postprocedural) (postinfection): Secondary | ICD-10-CM | POA: Diagnosis not present

## 2018-03-27 DIAGNOSIS — R0781 Pleurodynia: Secondary | ICD-10-CM | POA: Diagnosis not present

## 2018-03-27 DIAGNOSIS — Z79899 Other long term (current) drug therapy: Secondary | ICD-10-CM | POA: Diagnosis not present

## 2018-03-27 DIAGNOSIS — Z87891 Personal history of nicotine dependence: Secondary | ICD-10-CM | POA: Diagnosis not present

## 2018-04-01 DIAGNOSIS — E559 Vitamin D deficiency, unspecified: Secondary | ICD-10-CM | POA: Diagnosis not present

## 2018-04-01 DIAGNOSIS — R5383 Other fatigue: Secondary | ICD-10-CM | POA: Diagnosis not present

## 2018-04-01 DIAGNOSIS — J449 Chronic obstructive pulmonary disease, unspecified: Secondary | ICD-10-CM | POA: Diagnosis not present

## 2018-04-01 DIAGNOSIS — R0989 Other specified symptoms and signs involving the circulatory and respiratory systems: Secondary | ICD-10-CM | POA: Diagnosis not present

## 2018-04-07 DIAGNOSIS — R112 Nausea with vomiting, unspecified: Secondary | ICD-10-CM | POA: Diagnosis not present

## 2018-04-07 DIAGNOSIS — R5383 Other fatigue: Secondary | ICD-10-CM | POA: Diagnosis not present

## 2018-04-07 DIAGNOSIS — Z87891 Personal history of nicotine dependence: Secondary | ICD-10-CM | POA: Diagnosis not present

## 2018-04-07 DIAGNOSIS — E860 Dehydration: Secondary | ICD-10-CM | POA: Diagnosis not present

## 2018-04-09 ENCOUNTER — Emergency Department (HOSPITAL_COMMUNITY)
Admission: EM | Admit: 2018-04-09 | Discharge: 2018-04-09 | Disposition: A | Discharge status: Home / Self Care | Payer: Medicare Other | Attending: Emergency Medicine | Admitting: Emergency Medicine

## 2018-04-09 ENCOUNTER — Encounter (HOSPITAL_COMMUNITY): Payer: Self-pay

## 2018-04-09 ENCOUNTER — Emergency Department (HOSPITAL_COMMUNITY)
Admission: EM | Admit: 2018-04-09 | Discharge: 2018-04-10 | Discharge status: Against Medical Advice | Payer: Medicare Other | Source: Home / Self Care

## 2018-04-09 ENCOUNTER — Ambulatory Visit: Payer: Self-pay

## 2018-04-09 ENCOUNTER — Other Ambulatory Visit: Payer: Self-pay

## 2018-04-09 DIAGNOSIS — R1010 Upper abdominal pain, unspecified: Secondary | ICD-10-CM | POA: Diagnosis not present

## 2018-04-09 DIAGNOSIS — Z5321 Procedure and treatment not carried out due to patient leaving prior to being seen by health care provider: Secondary | ICD-10-CM | POA: Diagnosis not present

## 2018-04-09 DIAGNOSIS — R110 Nausea: Secondary | ICD-10-CM | POA: Insufficient documentation

## 2018-04-09 NOTE — ED Provider Notes (Signed)
Patient left soon after arriving in her exam room before medical evaluation.   Carmin Muskrat, MD 04/09/18 1217

## 2018-04-09 NOTE — ED Triage Notes (Signed)
Patient was in the ED earlier stating that she needed fluids. Patient left AMA and then called her PCP office to get fluids. Patient was told that she needed to come back to the ED. Patient's friend reports that he patient is not drinking po fluids as she should and is not voiding adequately. Patient reports all she drinks is Coke.

## 2018-04-09 NOTE — Telephone Encounter (Signed)
[facility] called to let us know [name] was sent to emergency room and left. Wanted to know if we had opening today we had none..First avail would be tomorrow at 12:00.advised [facility] to have pt return to ed.

## 2018-04-09 NOTE — ED Triage Notes (Addendum)
Patient c/o mid upper abdominal pain and nausea that she states she has had for years. patient states she went to her PCP a few days ago because she thinks she is dehydrated and the PCP office could not get any blood for lab testing and told the patient to come to the ED. Patient states she did not have a way to the ED to come sooner.

## 2018-04-09 NOTE — Telephone Encounter (Signed)
Pt friend [name] called to report that Ms [name] is "very dehydrated". Friend states that the patient is weak shakey and confused. She denies that the patient is diabetic. She reports that the patient was in the ED for treatment today but before she was seen they left. Pt has had multiple surgeries to abdomin as reported by Ms [name] and her intake is very limited. She states pt feels nauseated but is unable to vomit because of a surgical procedure. She has not had diarrhea per report of friend. Per request a call was place to PCP office. Per Felecia FC. Pt should proceed to the ED for evaluation.  [name] instructed per protocol to take patient for immediate evaluation at the nearest ED. [name] asked if it was ok to allow [name] to rest today and take her in the AM. Immediate evaluation was explained again to Ms [name]. Ms. [name] verbalized understanding. Care advice read to Ms [name]. Ms [name] verbalized understanding.   Reason for Disposition . [1] Drinking very little AND [2] dehydration suspected (e.g., no urine > 12 hours, very dry mouth, very lightheaded)  Answer Assessment - Initial Assessment Questions 1. DESCRIPTION: "Describe how you are feeling."     Weak shakey confused 2. SEVERITY: "How bad is it?"  "Can you stand and walk?"   - MILD - Feels weak or tired, but does not interfere with work, school or normal activities   - [place] to stand and walk; weakness interferes with work, school, or normal activities   - SEVERE - Unable to stand or walk    moderate 3. ONSET:  "When did the weakness begin?"     Woozy  2 day ago 4. CAUSE: "What do you think is causing the weakness?"     Nausea no vomiting  5. MEDICINES: "Have you recently started a new medicine or had a change in the amount of a medicine?"     no 6. OTHER SYMPTOMS: "Do you have any other symptoms?" (e.g., chest pain, fever, cough, SOB, vomiting, diarrhea, bleeding, other areas of pain)      no 7. PREGNANCY: "Is there any chance you are pregnant?" "When was your last menstrual period?"     N/A  Protocols used: WEAKNESS (GENERALIZED) AND FATIGUE-A-AH

## 2018-04-09 NOTE — ED Notes (Addendum)
Pt states she is not going to "wait and wait and wait" because she just wanted a bag of fluids. RN tried to explain to pt that she had to see a doctor first. Pt reported she had already seen a doctor and that she needed fluids.  MD Vanita Panda had not yet seen patient.  Pt left AMA.

## 2018-04-13 ENCOUNTER — Encounter (HOSPITAL_COMMUNITY): Payer: Self-pay

## 2018-04-13 ENCOUNTER — Emergency Department (HOSPITAL_COMMUNITY)
Admission: EM | Admit: 2018-04-13 | Discharge: 2018-04-13 | Disposition: A | Discharge status: Home / Self Care | Payer: Medicare Other | Attending: Emergency Medicine | Admitting: Emergency Medicine

## 2018-04-13 ENCOUNTER — Other Ambulatory Visit: Payer: Self-pay

## 2018-04-13 DIAGNOSIS — E860 Dehydration: Secondary | ICD-10-CM | POA: Insufficient documentation

## 2018-04-13 DIAGNOSIS — J449 Chronic obstructive pulmonary disease, unspecified: Secondary | ICD-10-CM | POA: Diagnosis not present

## 2018-04-13 DIAGNOSIS — Z79899 Other long term (current) drug therapy: Secondary | ICD-10-CM | POA: Diagnosis not present

## 2018-04-13 DIAGNOSIS — F17210 Nicotine dependence, cigarettes, uncomplicated: Secondary | ICD-10-CM | POA: Insufficient documentation

## 2018-04-13 DIAGNOSIS — R531 Weakness: Secondary | ICD-10-CM | POA: Diagnosis present

## 2018-04-13 LAB — CBC WITH DIFFERENTIAL/PLATELET
ABS IMMATURE GRANULOCYTES: 0.04 10*3/uL (ref 0.00–0.07)
BASOS ABS: 0.1 10*3/uL (ref 0.0–0.1)
Basophils Relative: 1 %
EOS PCT: 4 %
Eosinophils Absolute: 0.3 10*3/uL (ref 0.0–0.5)
HEMATOCRIT: 41.2 % (ref 36.0–46.0)
HEMOGLOBIN: 12.3 g/dL (ref 12.0–15.0)
IMMATURE GRANULOCYTES: 1 %
LYMPHS ABS: 1.8 10*3/uL (ref 0.7–4.0)
LYMPHS PCT: 22 %
MCH: 29.5 pg (ref 26.0–34.0)
MCHC: 29.9 g/dL — ABNORMAL LOW (ref 30.0–36.0)
MCV: 98.8 fL (ref 80.0–100.0)
Monocytes Absolute: 0.8 10*3/uL (ref 0.1–1.0)
Monocytes Relative: 10 %
NEUTROS PCT: 62 %
NRBC: 0 % (ref 0.0–0.2)
Neutro Abs: 5.2 10*3/uL (ref 1.7–7.7)
Platelets: 276 10*3/uL (ref 150–400)
RBC: 4.17 MIL/uL (ref 3.87–5.11)
RDW: 14.6 % (ref 11.5–15.5)
WBC: 8.3 10*3/uL (ref 4.0–10.5)

## 2018-04-13 LAB — BASIC METABOLIC PANEL
Anion gap: 9 (ref 5–15)
BUN: 18 mg/dL (ref 8–23)
CHLORIDE: 104 mmol/L (ref 98–111)
CO2: 25 mmol/L (ref 22–32)
CREATININE: 0.47 mg/dL (ref 0.44–1.00)
Calcium: 8.4 mg/dL — ABNORMAL LOW (ref 8.9–10.3)
GFR calc non Af Amer: >60 mL/min (ref >60)
Glucose, Bld: 95 mg/dL (ref 70–99)
Potassium: 3.9 mmol/L (ref 3.5–5.1)
Sodium: 138 mmol/L (ref 135–145)

## 2018-04-13 MED ORDER — SODIUM CHLORIDE 0.9 % IV BOLUS
500.0000 mL | Freq: Once | INTRAVENOUS | Status: AC
  Administered 2018-04-13: 500 mL via INTRAVENOUS

## 2018-04-13 NOTE — ED Notes (Signed)
IV team at bedside 

## 2018-04-13 NOTE — ED Provider Notes (Signed)
[facility] DEPT Provider Note   CSN: [id number] Arrival date & time: 04/13/18  1048     History   Chief Complaint Chief Complaint  Patient presents with  . sent by physician    HPI [name] is a 67 y.o. female presenting with family member for IV fluids.  Patient states that she was seen at her primary care provider 5 days ago for "not feeling well" and feeling "shaky".  Patient states that prior to primary care evaluation she was primarily drinking Coca-Cola and not drinking water.  Patient states that her primary care doctor's office was unable to start an IV to give her fluids and sent her to the emergency department.  Patient states that she presented twice to the emergency department on 10/17 however left both times due to long wait.  Patient states that she is drinking more water since that time and has been feeling improved.  Patient presents with her friend today to receive the IV fluids that her primary care provider told her to get 5 days ago.  Patient states that she is feeling well with no complaints and no pain.  States that the shakiness has resolved completely.  HPI  Past Medical History:  Diagnosis Date  . Acute duodenal ulcer with hemorrhage and perforation, with obstruction ([place]) 06/26/2004   multiple pyloric perferations  . Allergy   . Anemia   . Anxiety    sees [name] NP at Dr. Radonna Ricker office  . Asthma    hx of   . Barrett's esophagus   . Chronic abdominal pain    narcotic dependence, dr gyarteng-dak [facility]  . 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 Complication of anesthesia    pt states had too much xanax on board - agitated   . COPD (chronic obstructive pulmonary disease) ([place])   . Depression   . Exertional shortness of breath   . Fall 11/23/2014   frequent falls  . Fx of fibula 07-28-11   left fibula, 2 places   . Fx two ribs-open 08-24-11   left 4th and 5th  .  GASTRIC OUTLET OBSTRUCTION 08/28/2007   In July 2008 she underwent laparoscopic enterolysis, Nissen fundoplication over a #43 bougie, single pledgeted suture colosure of the hiatus and a 3 suture wrap.  Lap truncal vagotomy and a loop gastrojejunostomy was performed.  This was revised in August of 2009 to a roux en Y gastrojejujostomy.     . Gastroparesis 06/03/2007   Qualifier: Diagnosis of  By: [name] MD, [name]   . GERD (gastroesophageal reflux disease)   . History of blood transfusion    "related to OR; maybe when I perforated that ulcer"  . History of hiatal hernia   . Lap Nissen + truncal vagotomy July 2008 05/13/2013  . LEG EDEMA 01/19/2008   Qualifier: Diagnosis of  By: [name] MD, [name]   . Macular degeneration, bilateral    early onset, severe, legally blind and worsening. Followed by dr. Katy Fitch  . MENOPAUSE 01/07/2007   Qualifier: Diagnosis of  By: [name], CMA, [place]    . Narcotic abuse ([place])   . Osteoporosis    severe  . Oxygen deficiency   . Oxygen dependent    3L/[place]   . Peptic ulcer disease    dr Oletta Lamas  . Pneumonia, organism unspecified(486) 11/07/2010   Assoc with R Parapneumonic effusion 09/2010    - Tapped 10/05/10    -  CxR resolved 11/07/2010    . S/P jejunostomy Feb 2016 07/26/2014  . Thyroid nodule 2013   benign after biopsy w/ Dr. Redmond Baseman    Patient Active Problem List   Diagnosis Date Noted  . Abdominal pain, chronic, epigastric 05/31/2017  . Episodic polysubstance dependence ([place]) 05/31/2017  . At high risk for injury related to fall 05/18/2017  . Chronic nausea 05/18/2017  . Poor compliance with medication 11/20/2016  . Insomnia 11/20/2016  . Early onset macular degeneration 11/20/2016  . Legally blind 11/20/2016  . Abdominal pain   . Hypoxia 04/08/2015  . Opioid type dependence, continuous ([place])   . Protein-calorie malnutrition ([place])   . Episode of recurrent major depressive disorder ([place])   . Secondary hyperparathyroidism ([place]) 01/10/2015  . Vitamin D  deficiency 01/10/2015  . 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 Anemia, iron deficiency 11/03/2014  . Chronic pain syndrome 10/24/2014  . Osteoporosis 10/05/2014  . Vertebral compression fracture ([place]) 10/05/2014  . Coronary artery disease due to calcified coronary lesion 10/05/2014  . Weight loss   . Tobacco abuse   . History of subtotal gastrectomy with Roux-en-Y recontruction   . Orthostatic hypotension 08/11/2014  . Memory loss, short term 07/18/2014  . Constipation, chronic 07/10/2013  . Chronic abdominal pain   . COPD (chronic obstructive pulmonary disease) ([place]) 11/07/2010  . Major depressive disorder, recurrent episode, moderate ([place]) 06/06/2010  . 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 Anxiety state 01/07/2007  . Barrett's esophagus 07/03/2005    Past Surgical History:  Procedure Laterality Date  . BIOPSY THYROID  08-13-11   benign nodule, per Dr. Melida Quitter   . CATARACT EXTRACTION W/ INTRAOCULAR LENS  IMPLANT, BILATERAL Bilateral   . CHOLECYSTECTOMY N/A 12/02/2017   Procedure: LAPAROSCOPIC CHOLECYSTECTOMY WITH INTRAOPERATIVE CHOLANGIOGRAM ERAS PATHWAY;  Surgeon: Johnathan Hausen, MD;  Location: [facility];  Service: General;  Laterality: N/A;  . COLONOSCOPY    . DILATION AND CURETTAGE OF UTERUS    . ESOPHAGOGASTRODUODENOSCOPY  12-29-09   dr qadeer at [facility], several gastric ulcers  . ESOPHAGOGASTRODUODENOSCOPY N/A 09/25/2012   Procedure: ESOPHAGOGASTRODUODENOSCOPY (EGD);  Surgeon: Beryle Beams, MD;  [facility];  Service: Endoscopy;  Laterality: N/A;  . ESOPHAGOGASTRODUODENOSCOPY (EGD) WITH PROPOFOL N/A 06/27/2017   Procedure: ESOPHAGOGASTRODUODENOSCOPY (EGD) WITH PROPOFOL;  Surgeon: Carol Ada, MD;  [facility];  Service: Endoscopy;  Laterality: N/A;  . FRACTURE SURGERY Left 2010   hip and leg s/p fall  . GASTROJEJUNOSTOMY  02/20/2008   Roux en Y gastrojejunsotomy w 1 foot Roux limb,  laparotomy, takedown loop gastrojejunostomy with removal of Ntinol stent  . GASTROSTOMY N/A 07/26/2014   Procedure: LAPRASCOPIC ASSISTED OPEN PLACEMENT OF JEJUNOSTOMY TUBE;  Surgeon: Pedro Earls, MD;  Location: [facility];  Service: General;  Laterality: N/A;  . HERNIA REPAIR     "hiatal"  . JOINT REPLACEMENT    . LAPAROSCOPIC NISSEN FUNDOPLICATION  2505  . LAPAROSCOPIC PARTIAL GASTRECTOMY N/A 06/29/2013   Procedure: Gastrectomy and GJ Tube placement;  Surgeon: Pedro Earls, MD;  Location[facility];  Service: General;  Laterality: N/A;  . ORIF FEMORAL NECK FRACTURE W/ DHS Right 03/2014  . ORIF HIP FRACTURE Left 2010  . REPAIR OF PERFORATED ULCER    . TONSILLECTOMY  1974  . TRUNCAL VAGOTOMY  12/2006   Laparoscopic enterolysis, laparoscopic Nissen's Fundoplication, laparoscopic truncal vagotomy & loop gastrojejunostomy     OB History   None      Home  Medications    Prior to Admission medications   Medication Sig Start Date End Date Taking? Authorizing Provider  albuterol (PROVENTIL HFA;VENTOLIN HFA) 108 (90 Base) MCG/ACT inhaler Inhale 2 puffs into the lungs every 4 (four) hours as needed (cough, shortness of breath or wheezing.). 08/01/15  Yes Shawnee Knapp, MD  Aspirin-Acetaminophen (GOODYS BODY PAIN PO) Take 1-2 packets by mouth every 8 (eight) hours as needed (pain).    Yes [provider]  BELSOMRA 20 MG TABS TAKE ONE TABLET AT BEDTIME. Patient taking differently: Take 20 mg by mouth at bedtime as needed (insomnia).  11/03/17  Yes Shawnee Knapp, MD  busPIRone (BUSPAR) 10 MG tablet TAKE (2) TABLETS THREE TIMES DAILY. Patient taking differently: Take 10 mg by mouth 3 (three) times daily.  12/20/17  Yes Shawnee Knapp, MD  DULoxetine (CYMBALTA) 30 MG capsule Take 90 mg by mouth daily.    Yes [provider]  HYDROcodone-acetaminophen (NORCO/VICODIN) 5-325 MG tablet Take 1 tablet by mouth every 6 (six) hours as needed for moderate pain.  12/12/17  Yes [provider]    ondansetron (ZOFRAN-ODT) 8 MG disintegrating tablet Take 1 tablet (8 mg total) by mouth every 8 (eight) hours as needed for nausea. 12/19/17  Yes Shawnee Knapp, MD  benzonatate (TESSALON) 200 MG capsule Take 1 capsule (200 mg total) by mouth 3 (three) times daily as needed for cough. Patient not taking: Reported on 04/13/2018 12/18/17   Shawnee Knapp, MD  ipratropium (ATROVENT) 0.03 % nasal spray Place 2 sprays into the nose 4 (four) times daily. Patient not taking: Reported on 04/13/2018 12/18/17   Shawnee Knapp, MD  oxyCODONE (OXY IR/ROXICODONE) 5 MG immediate release tablet Take 1 tablet (5 mg total) by mouth every 6 (six) hours as needed for severe pain. Patient not taking: Reported on 04/13/2018 12/02/17   Johnathan Hausen, MD  sucralfate (CARAFATE) 1 g tablet Take 1 tablet (1 g total) by mouth 4 (four) times daily -  with meals and at bedtime. Patient not taking: Reported on 01/28/2018 05/31/17   Shawnee Knapp, MD    Family History Family History  Problem Relation Age of Onset  . Cancer Mother        kidney/bladder cancer, magliant skin cancer on face, cervical cancer  . Stroke Mother   . Hypertension Mother   . Celiac disease Father   . Cancer Father        metastatic renal cell carcinoma, pancreatic cancer, colon polyps  . Colon cancer Maternal Uncle   . Breast cancer Neg Hx   . Ovarian cancer Neg Hx   . Endometrial cancer Neg Hx     Social History Social History   Tobacco Use  . Smoking status: Current Every Day Smoker    Packs/day: 1.00    Years: 51.00    Pack years: 51.00    Types: Cigarettes  . Smokeless tobacco: Never Used  Substance Use Topics  . Alcohol use: No    Alcohol/week: 0.0 standard drinks  . Drug use: No     Allergies   Lithium; Celebrex [celecoxib]; Morphine; Quetiapine; and Varenicline tartrate   Review of Systems Review of Systems  Constitutional: Negative.  Negative for chills and fever.  HENT: Negative.  Negative for rhinorrhea and sore throat.    Eyes: Negative.  Negative for visual disturbance.  Respiratory: Negative.  Negative for cough and shortness of breath.   Cardiovascular: Negative.  Negative for chest pain.  Gastrointestinal: Negative.  Negative for abdominal pain, diarrhea, nausea and vomiting.  Genitourinary: Negative.  Negative for dysuria and hematuria.  Musculoskeletal: Negative.  Negative for arthralgias and myalgias.  Skin: Negative.  Negative for rash.  Neurological: Negative.  Negative for dizziness, weakness, light-headedness and headaches.   Physical Exam Updated Vital Signs BP (!) 158/86 (BP Location: Right Arm)   Pulse (!) 59   Temp 98.4 F (36.9 C) (Oral)   Resp 16   Ht 5\' 4"  (1.626 m)   Wt 45.6 kg   SpO2 100%   BMI 17.26 kg/m   Physical Exam  Constitutional: She is oriented to person, place, and time. She appears well-developed and well-nourished. No distress.  HENT:  Head: Normocephalic and atraumatic.  Right Ear: External ear normal.  Left Ear: External ear normal.  Nose: Nose normal.  Mouth/Throat: Uvula is midline, oropharynx is clear and moist and mucous membranes are normal.  Eyes: Pupils are equal, round, and reactive to light. EOM are normal.  Neck: Trachea normal and normal range of motion. No tracheal deviation present.  Cardiovascular: Normal rate, regular rhythm, normal heart sounds and intact distal pulses.  Pulses:      Dorsalis pedis pulses are 2+ on the right side, and 2+ on the left side.       Posterior tibial pulses are 2+ on the right side, and 2+ on the left side.  Pulmonary/Chest: Effort normal and breath sounds normal. No accessory muscle usage. No respiratory distress. She has no decreased breath sounds. She has no rhonchi. She exhibits no tenderness, no crepitus and no deformity.  Abdominal: Soft. There is no tenderness. There is no rebound and no guarding.  Multiple well-healed abdominal surgical scars present.  Musculoskeletal: Normal range of motion.  Feet:  Right  Foot:  Protective Sensation: 3 sites tested. 3 sites sensed.  Left Foot:  Protective Sensation: 3 sites tested. 3 sites sensed.  Neurological: She is alert and oriented to person, place, and time. No sensory deficit. GCS eye subscore is 4. GCS verbal subscore is 5. GCS motor subscore is 6.  Speech is clear and goal oriented, follows commands Major Cranial nerves without deficit, no facial droop Normal strength in upper and lower extremities bilaterally including dorsiflexion and plantar flexion, strong and equal grip strength Sensation normal to light touch Moves extremities without ataxia, coordination intact Normal gait  Skin: Skin is warm and dry.  Psychiatric: She has a normal mood and affect. Her behavior is normal.    ED Treatments / Results  Labs (all labs ordered are listed, but only abnormal results are displayed) Labs Reviewed  BASIC METABOLIC PANEL - Abnormal; Notable for the following components:      Result Value   Calcium 8.4 (*)    All other components within normal limits  CBC WITH DIFFERENTIAL/PLATELET - Abnormal; Notable for the following components:   MCHC 29.9 (*)    All other components within normal limits  CBC WITH DIFFERENTIAL/PLATELET    EKG None  Radiology No results found.  Procedures Procedures (including critical care time)  Medications Ordered in ED Medications  sodium chloride 0.9 % bolus 500 mL (0 mLs Intravenous Stopped 04/13/18 1606)     Initial Impression / Assessment and Plan / ED Course  I have reviewed the triage vital signs and the nursing notes.  Pertinent labs & imaging results that were available during my care of the patient were reviewed by me and considered in my medical decision making (see chart for details).  Clinical  Course as of Apr 14 1607  Mon Apr 13, 2018  1524 Discussed with Dr. Zenia Resides; discharge after fluids.   [BM]  1556 Patient ambulatory in department without difficulty, assistance or lightheadedness.    [BM]    Clinical Course User Index [BM] Deliah Boston, PA-C   Patient presented 5 days after primary care visit for IV fluids.  Patient has increased her water intake over the past 5 days and states that she is doing well at this time however is still adamant about receiving IV fluids here in department.  Patient without any other complaints.  Plan at this time is to evaluate for dehydration, give IV fluids and evaluate orthostatic vital signs.  CBC nonacute BMP nonacute Afebrile, not tachycardic, not hypotensive well-appearing in no acute distress. Patient received 500 cc normal saline. Patient ambulatory in emergency department no complaints of lightheadedness, dizziness and is ambulatory without difficulty or assistance.  Patient without any complaints at this time denying any and all pain and states that she feels well.  Patient states that she wishes to be discharged at this time. Patient encouraged to keep up her oral water intake.  At this time there does not appear to be any evidence of an acute emergency medical condition and the patient appears stable for discharge with appropriate outpatient follow up. Diagnosis was discussed with patient who verbalizes understanding of care plan and is agreeable to discharge. I have discussed return precautions with patient and family at bedside who verbalize understanding of return precautions. Patient strongly encouraged to follow-up with their PCP. All questions answered.  Patient's case discussed with Dr. Zenia Resides who agrees with plan to discharge with follow-up.     Note: Portions of this report may have been transcribed using voice recognition software. Every effort was made to ensure accuracy; however, inadvertent computerized transcription errors may still be present.   Final Clinical Impressions(s) / ED Diagnoses   Final diagnoses:  Dehydration    ED Discharge Orders    None       Gari Crown 04/13/18 1609      Lacretia Leigh, MD 04/14/18 [phone]

## 2018-04-13 NOTE — Discharge Instructions (Addendum)
Please return to the Emergency Department for any new or worsening symptoms or if your symptoms do not improve. Please be sure to follow up with your Primary Care Physician as soon as possible regarding your visit today. If you do not have a Primary Doctor please use the resources below to establish one. Please be sure to drink plenty of water and follow-up with your primary care provider for reevaluation as soon as possible.  Contact a health care provider if: You have abdominal pain that: Gets worse. Stays in one area (localizes). You have a rash. You have a stiff neck. You are sleepier, more irritable, or more difficult to wake up than usual. You feel weak, dizzy, or very thirsty. Get help right away if: You have: Symptoms of severe dehydration. A fever. A severe headache. Vomiting or diarrhea that gets worse or does not go away. Diarrhea for more than 24 hours. Blood or green matter (bile) in your vomit. Blood in your stool. This may cause stool to look black and tarry. Trouble breathing. You cannot drink fluids without vomiting. Your symptoms get worse with treatment. You have not urinated in 6-8 hours. You have urinated only a small amount of very dark urine over 6-8 hours. You faint. Your heart rate while sitting still is over 100 beats a minute.  RESOURCE GUIDE  Chronic Pain Problems: Contact [facility]  [phone] Patients need to be referred by their primary care doctor.  Insufficient Money for Medicine: Contact [employer] Way:  call "211" or [place] [phone].  No Primary Care Doctor: Call [facility]  [phone] - can help you locate a primary care doctor that  accepts your insurance, provides certain services, etc. Physician Referral Service- [phone]  Agencies that provide inexpensive medical care: [facility]  [facility]  [phone] [facility]  [phone] [facility]  Clinic  [phone] [facility]  [phone] [facility]  [phone]  [place] Providers: [facility]- [address], [address]  [phone], Mon-Fri 9am-7pm, Sat 9am-1pm [place], [address] [place]  [place], [address] [place]  [place]- [address]  [place], [address], [phone]  Only accepts [place] Access [place] patients after they have their name  applied to their card  Self Pay (no insurance) in [facility]: Sickle Cell Patients: Dr Kevan Ny, [facility]  [place], [facility]- [place]  [facility] [place]- [address], [place]- see information above (Speak to [facility] if you do not have insurance)       -  [facility]- [place], [place],  [place] [address], [phone]       -  Dr Vista Lawman-  [address], [place], [place] [facility]- [address] [phone]       -  [facility]- [address], [place], also [address] [phone]       -    [facility]- [address], [place], 1st & 3rd Saturday   every month, 10am-1pm  1) Find a Doctor and Pay Out of Pocket Although you won't have to find out who is covered by your insurance plan, it is a good idea to ask around and get recommendations. You will then need to call the office and see if the doctor you have chosen will accept you as a new patient and what types of options they offer for patients who are self-pay. Some doctors offer discounts or will set up payment plans for their patients who do not  have insurance, but you will need to ask so you aren't surprised when you get to your appointment.  2) Contact Your Local Health Department Not all health departments have doctors that can see patients for sick visits, but many do, so it is worth a call to see if yours does. If you don't know where your local health department is, you can check in your phone book. The CDC also has a tool to help you locate your state's health department, and many state websites also have listings of all of their local health departments.  3) Find a [facility] If your illness is not likely to be very severe or complicated, you may want to try a walk in clinic. These are popping up all over the country in pharmacies, drugstores, and shopping centers. They're usually staffed by nurse practitioners or physician assistants that have been trained to treat common illnesses and complaints. They're usually fairly quick and inexpensive. However, if you have serious medical issues or chronic medical problems, these are probably not your best option  [facility] [place], [facility], [address], [place], phone [phone] or [phone].  Monday - Friday, call for an appointment. [place], [facility], [address], [place], phone [phone] or [phone].  Monday - Friday, call for an appointment.  Abuse/Neglect: [place] [phone] [place] [phone] (After Hours)  Emergency Shelter:  [facility] [phone]  Maternity Homes: [facility] at the [place] [phone] [place] [phone]  MRSA Hotline #:   (551)362-1626  [facility] Dept. [address]         [place] Phone:  [phone]                                  Phone:  [phone]  Phone:  [place], [place]- [phone]       -     [facility] in [place], [address],                                  [place] [phone] or [phone] (After Hours)   [place]  Substance Abuse Resources: Alcohol and Drug Services  [place] [phone] [facility] [place] [facility]a Pester [phone] Residential & Outpatient Substance Abuse Program  [phone]  Psychological Services: [facility] [place]  [phone] [place]  [place], [place]. [address], [place], [place]: [phone] or [phone], [employer]  Dental Assistance  If unable to pay or uninsured, contact:  [facility] or [facility]. to become qualified for the adult dental clinic.  Patients with Medicaid: [facility] [phone] [address], [place] [address], [phone]  If unable to pay, or uninsured, contact [facility] [phone]) or [place] [phone] in [place], [place] in [facility]) to become qualified for the adult dental clinic   Other [place]- [place], [place], [place], [address], [place], [place], 2nd and 4th Thursday of the month at 6:30am.  10 clients each day by appointment, can sometimes see walk-in patients if someone does not show for an appointment. [facility]- [address] Hillard Danker [place], [place], [address], [place], [place], [place], [address], [place] Department- [phone] [place] [facility] [phone]

## 2018-04-13 NOTE — ED Triage Notes (Signed)
paitient was here recently x 2 and left AMA. patient went to her physician today and stated that she is dehydrated. Patient states they could not get her blood work and told her to go to the ED. Both times patient states she needs IV fluids. Patient states she is not vomiting or having diarrhea. patient does state that she drinks more coke than water.

## 2018-04-20 DIAGNOSIS — J449 Chronic obstructive pulmonary disease, unspecified: Secondary | ICD-10-CM | POA: Diagnosis not present

## 2018-04-23 ENCOUNTER — Emergency Department (HOSPITAL_COMMUNITY): Payer: Medicare Other

## 2018-04-23 ENCOUNTER — Emergency Department (HOSPITAL_COMMUNITY)
Admission: EM | Admit: 2018-04-23 | Discharge: 2018-04-24 | Disposition: A | Discharge status: Home / Self Care | Payer: Medicare Other | Attending: Emergency Medicine | Admitting: Emergency Medicine

## 2018-04-23 ENCOUNTER — Other Ambulatory Visit: Payer: Self-pay

## 2018-04-23 ENCOUNTER — Encounter (HOSPITAL_COMMUNITY): Payer: Self-pay | Admitting: Emergency Medicine

## 2018-04-23 DIAGNOSIS — J449 Chronic obstructive pulmonary disease, unspecified: Secondary | ICD-10-CM | POA: Insufficient documentation

## 2018-04-23 DIAGNOSIS — T424X5A Adverse effect of benzodiazepines, initial encounter: Secondary | ICD-10-CM | POA: Diagnosis not present

## 2018-04-23 DIAGNOSIS — R0902 Hypoxemia: Secondary | ICD-10-CM | POA: Diagnosis not present

## 2018-04-23 DIAGNOSIS — Z79899 Other long term (current) drug therapy: Secondary | ICD-10-CM | POA: Diagnosis not present

## 2018-04-23 DIAGNOSIS — F17210 Nicotine dependence, cigarettes, uncomplicated: Secondary | ICD-10-CM | POA: Diagnosis not present

## 2018-04-23 DIAGNOSIS — W19XXXA Unspecified fall, initial encounter: Secondary | ICD-10-CM | POA: Diagnosis not present

## 2018-04-23 DIAGNOSIS — R400 Somnolence: Secondary | ICD-10-CM

## 2018-04-23 DIAGNOSIS — T402X5A Adverse effect of other opioids, initial encounter: Secondary | ICD-10-CM | POA: Diagnosis not present

## 2018-04-23 DIAGNOSIS — R4182 Altered mental status, unspecified: Secondary | ICD-10-CM | POA: Diagnosis present

## 2018-04-23 DIAGNOSIS — T50905A Adverse effect of unspecified drugs, medicaments and biological substances, initial encounter: Secondary | ICD-10-CM

## 2018-04-23 DIAGNOSIS — R531 Weakness: Secondary | ICD-10-CM | POA: Diagnosis not present

## 2018-04-23 DIAGNOSIS — I959 Hypotension, unspecified: Secondary | ICD-10-CM | POA: Diagnosis not present

## 2018-04-23 MED ORDER — SODIUM CHLORIDE 0.9 % IV BOLUS
1000.0000 mL | Freq: Once | INTRAVENOUS | Status: AC
  Administered 2018-04-23: 1000 mL via INTRAVENOUS

## 2018-04-23 NOTE — ED Provider Notes (Signed)
[facility] EMERGENCY DEPARTMENT Provider Note   CSN: [id number] Arrival date & time: 04/23/18  2056     History   Chief Complaint Chief Complaint  Patient presents with  . Altered Mental Status    HPI [name] is a 67 y.o. female.  Patient brought in by ambulance for altered mental status from home.  Reportedly by EMS patient took 1 Xanax and 1 oxycodone from her roommate to help with her gastroparesis pain.  When asked why the patient was here she said because she was stumbling.  When I asked her why she was stumbling she could not give me an answer.  Currently she is complaining of some abdominal pain that is chronic for her.  She denies any fevers or chills no headache no chest pain or shortness of breath.  The history is provided by the patient and the EMS personnel.  Altered Mental Status   This is a new problem. The current episode started 3 to 5 hours ago. The problem has not changed since onset.Associated symptoms include somnolence. Risk factors include the patient not taking medications correctly. Her past medical history is significant for COPD and psychotropic medication treatment.    Past Medical History:  Diagnosis Date  . Acute duodenal ulcer with hemorrhage and perforation, with obstruction ([place]) 06/26/2004   multiple pyloric perferations  . Allergy   . Anemia   . Anxiety    sees [name] NP at Dr. Radonna Ricker office  . Asthma    hx of   . Barrett's esophagus   . Chronic abdominal pain    narcotic dependence, dr gyarteng-dak at [facility]  . 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 Complication of anesthesia    pt states had too much xanax on board - agitated   . COPD (chronic obstructive pulmonary disease) ([place])   . Depression   . Exertional shortness of breath   . Fall 11/23/2014   frequent falls  . Fx of fibula 07-28-11   left fibula, 2 places   . Fx two ribs-open 08-24-11   left 4th and 5th    . GASTRIC OUTLET OBSTRUCTION 08/28/2007   In July 2008 she underwent laparoscopic enterolysis, Nissen fundoplication over a #05 bougie, single pledgeted suture colosure of the hiatus and a 3 suture wrap.  Lap truncal vagotomy and a loop gastrojejunostomy was performed.  This was revised in August of 2009 to a roux en Y gastrojejujostomy.     . Gastroparesis 06/03/2007   Qualifier: Diagnosis of  By: [name] MD, [name]   . GERD (gastroesophageal reflux disease)   . History of blood transfusion    "related to OR; maybe when I perforated that ulcer"  . History of hiatal hernia   . Lap Nissen + truncal vagotomy July 2008 05/13/2013  . LEG EDEMA 01/19/2008   Qualifier: Diagnosis of  By: [name] MD, [name]   . Macular degeneration, bilateral    early onset, severe, legally blind and worsening. Followed by dr. Katy Fitch  . MENOPAUSE 01/07/2007   Qualifier: Diagnosis of  By: [name], CMA, [place]    . Narcotic abuse ([place])   . Osteoporosis    severe  . Oxygen deficiency   . Oxygen dependent    3L/[place]   . Peptic ulcer disease    dr Oletta Lamas  . Pneumonia, organism unspecified(486) 11/07/2010   Assoc with R Parapneumonic effusion 09/2010    - Tapped 10/05/10    -  CxR resolved 11/07/2010    . S/P jejunostomy Feb 2016 07/26/2014  . Thyroid nodule 2013   benign after biopsy w/ Dr. Redmond Baseman    Patient Active Problem List   Diagnosis Date Noted  . Abdominal pain, chronic, epigastric 05/31/2017  . Episodic polysubstance dependence ([place]) 05/31/2017  . At high risk for injury related to fall 05/18/2017  . Chronic nausea 05/18/2017  . Poor compliance with medication 11/20/2016  . Insomnia 11/20/2016  . Early onset macular degeneration 11/20/2016  . Legally blind 11/20/2016  . Abdominal pain   . Hypoxia 04/08/2015  . Opioid type dependence, continuous ([place])   . Protein-calorie malnutrition ([place])   . Episode of recurrent major depressive disorder ([place])   . Secondary hyperparathyroidism ([place]) 01/10/2015  . Vitamin D  deficiency 01/10/2015  . 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 Anemia, iron deficiency 11/03/2014  . Chronic pain syndrome 10/24/2014  . Osteoporosis 10/05/2014  . Vertebral compression fracture ([place]) 10/05/2014  . Coronary artery disease due to calcified coronary lesion 10/05/2014  . Weight loss   . Tobacco abuse   . History of subtotal gastrectomy with Roux-en-Y recontruction   . Orthostatic hypotension 08/11/2014  . Memory loss, short term 07/18/2014  . Constipation, chronic 07/10/2013  . Chronic abdominal pain   . COPD (chronic obstructive pulmonary disease) ([place]) 11/07/2010  . Major depressive disorder, recurrent episode, moderate ([place]) 06/06/2010  . 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 Anxiety state 01/07/2007  . Barrett's esophagus 07/03/2005    Past Surgical History:  Procedure Laterality Date  . BIOPSY THYROID  08-13-11   benign nodule, per Dr. Melida Quitter   . CATARACT EXTRACTION W/ INTRAOCULAR LENS  IMPLANT, BILATERAL Bilateral   . CHOLECYSTECTOMY N/A 12/02/2017   Procedure: LAPAROSCOPIC CHOLECYSTECTOMY WITH INTRAOPERATIVE CHOLANGIOGRAM ERAS PATHWAY;  Surgeon: Johnathan Hausen, MD;  Location: [facility];  Service: General;  Laterality: N/A;  . COLONOSCOPY    . DILATION AND CURETTAGE OF UTERUS    . ESOPHAGOGASTRODUODENOSCOPY  12-29-09   dr qadeer at [facility], several gastric ulcers  . ESOPHAGOGASTRODUODENOSCOPY N/A 09/25/2012   Procedure: ESOPHAGOGASTRODUODENOSCOPY (EGD);  Surgeon: Beryle Beams, MD;  Location: [facility];  Service: Endoscopy;  Laterality: N/A;  . ESOPHAGOGASTRODUODENOSCOPY (EGD) WITH PROPOFOL N/A 06/27/2017   Procedure: ESOPHAGOGASTRODUODENOSCOPY (EGD) WITH PROPOFOL;  Surgeon: Carol Ada, MD;  Location: [facility];  Service: Endoscopy;  Laterality: N/A;  . FRACTURE SURGERY Left 2010   hip and leg s/p fall  . GASTROJEJUNOSTOMY  02/20/2008   Roux en Y gastrojejunsotomy w 1 foot Roux limb,  laparotomy, takedown loop gastrojejunostomy with removal of Ntinol stent  . GASTROSTOMY N/A 07/26/2014   Procedure: LAPRASCOPIC ASSISTED OPEN PLACEMENT OF JEJUNOSTOMY TUBE;  Surgeon: Pedro Earls, MD;  Location: [facility];  Service: General;  Laterality: N/A;  . HERNIA REPAIR     "hiatal"  . JOINT REPLACEMENT    . LAPAROSCOPIC NISSEN FUNDOPLICATION  0867  . LAPAROSCOPIC PARTIAL GASTRECTOMY N/A 06/29/2013   Procedure: Gastrectomy and GJ Tube placement;  Surgeon: Pedro Earls, MD;  Location: [facility];  Service: General;  Laterality: N/A;  . ORIF FEMORAL NECK FRACTURE W/ DHS Right 03/2014  . ORIF HIP FRACTURE Left 2010  . REPAIR OF PERFORATED ULCER    . TONSILLECTOMY  1974  . TRUNCAL VAGOTOMY  12/2006   Laparoscopic enterolysis, laparoscopic Nissen's Fundoplication, laparoscopic truncal vagotomy & loop gastrojejunostomy     OB History   None      Home  Medications    Prior to Admission medications   Medication Sig Start Date End Date Taking? Authorizing Provider  albuterol (PROVENTIL HFA;VENTOLIN HFA) 108 (90 Base) MCG/ACT inhaler Inhale 2 puffs into the lungs every 4 (four) hours as needed (cough, shortness of breath or wheezing.). 08/01/15   Shawnee Knapp, MD  Aspirin-Acetaminophen (GOODYS BODY PAIN PO) Take 1-2 packets by mouth every 8 (eight) hours as needed (pain).     [provider]  BELSOMRA 20 MG TABS TAKE ONE TABLET AT BEDTIME. Patient taking differently: Take 20 mg by mouth at bedtime as needed (insomnia).  11/03/17   Shawnee Knapp, MD  benzonatate (TESSALON) 200 MG capsule Take 1 capsule (200 mg total) by mouth 3 (three) times daily as needed for cough. Patient not taking: Reported on 04/13/2018 12/18/17   Shawnee Knapp, MD  busPIRone (BUSPAR) 10 MG tablet TAKE (2) TABLETS THREE TIMES DAILY. Patient taking differently: Take 10 mg by mouth 3 (three) times daily.  12/20/17   Shawnee Knapp, MD  DULoxetine (CYMBALTA) 30 MG capsule Take 90 mg by mouth daily.     [provider]  HYDROcodone-acetaminophen (NORCO/VICODIN) 5-325 MG tablet Take 1 tablet by mouth every 6 (six) hours as needed for moderate pain.  12/12/17   [provider]  ipratropium (ATROVENT) 0.03 % nasal spray Place 2 sprays into the nose 4 (four) times daily. Patient not taking: Reported on 04/13/2018 12/18/17   Shawnee Knapp, MD  ondansetron (ZOFRAN-ODT) 8 MG disintegrating tablet Take 1 tablet (8 mg total) by mouth every 8 (eight) hours as needed for nausea. 12/19/17   Shawnee Knapp, MD  oxyCODONE (OXY IR/ROXICODONE) 5 MG immediate release tablet Take 1 tablet (5 mg total) by mouth every 6 (six) hours as needed for severe pain. Patient not taking: Reported on 04/13/2018 12/02/17   Johnathan Hausen, MD  sucralfate (CARAFATE) 1 g tablet Take 1 tablet (1 g total) by mouth 4 (four) times daily -  with meals and at bedtime. Patient not taking: Reported on 01/28/2018 05/31/17   Shawnee Knapp, MD    Family History Family History  Problem Relation Age of Onset  . Cancer Mother        kidney/bladder cancer, magliant skin cancer on face, cervical cancer  . Stroke Mother   . Hypertension Mother   . Celiac disease Father   . Cancer Father        metastatic renal cell carcinoma, pancreatic cancer, colon polyps  . Colon cancer Maternal Uncle   . Breast cancer Neg Hx   . Ovarian cancer Neg Hx   . Endometrial cancer Neg Hx     Social History Social History   Tobacco Use  . Smoking status: Current Every Day Smoker    Packs/day: 1.00    Years: 51.00    Pack years: 51.00    Types: Cigarettes  . Smokeless tobacco: Never Used  Substance Use Topics  . Alcohol use: No    Alcohol/week: 0.0 standard drinks  . Drug use: No     Allergies   Lithium; Celebrex [celecoxib]; Morphine; Quetiapine; and Varenicline tartrate   Review of Systems Review of Systems  Constitutional: Negative for fever.  HENT: Negative for sore throat.   Eyes: Negative for visual disturbance.  Respiratory: Negative for  shortness of breath.   Cardiovascular: Negative for chest pain.  Gastrointestinal: Positive for abdominal pain.  Genitourinary: Negative for dysuria.  Musculoskeletal: Negative for neck pain.  Skin: Negative for  rash.  Neurological: Negative for headaches.     Physical Exam Updated Vital Signs BP (!) 102/57   Pulse 76   Temp 99.2 F (37.3 C) (Oral)   Resp 18   SpO2 100%   Physical Exam  Constitutional: She appears lethargic. She appears cachectic. No distress.  HENT:  Head: Normocephalic and atraumatic.  Eyes: Conjunctivae are normal.  Neck: Neck supple.  Cardiovascular: Normal rate and regular rhythm.  No murmur heard. Pulmonary/Chest: Effort normal and breath sounds normal. No respiratory distress.  Abdominal: Soft. She exhibits no mass. There is no tenderness. There is no guarding.  Musculoskeletal: She exhibits no edema.  Neurological: She has normal strength. She appears lethargic. She is disoriented (to time). No sensory deficit. GCS eye subscore is 4. GCS verbal subscore is 5. GCS motor subscore is 6.  Patient awake but nods off during questioning at times.  Skin: Skin is warm and dry.  Psychiatric: She has a normal mood and affect.  Nursing note and vitals reviewed.    ED Treatments / Results  Labs (all labs ordered are listed, but only abnormal results are displayed) Labs Reviewed  COMPREHENSIVE METABOLIC PANEL - Abnormal; Notable for the following components:      Result Value   CO2 21 (*)    Glucose, Bld 100 (*)    BUN 7 (*)    Creatinine, Ser 1.08 (*)    Calcium 8.3 (*)    Total Protein 5.9 (*)    Albumin 3.4 (*)    GFR calc non Af Amer 52 (*)    All other components within normal limits  CBC WITH DIFFERENTIAL/PLATELET - Abnormal; Notable for the following components:   Hemoglobin 11.5 (*)    All other components within normal limits    EKG None  Radiology Dg Chest 1 View  Result Date: 04/23/2018 CLINICAL DATA:  Altered mental status. EXAM:  CHEST  1 VIEW COMPARISON:  12/18/2017 and 03/26/2018 FINDINGS: Lungs are adequately inflated without airspace consolidation or effusion. Cardiomediastinal silhouette and remainder of the exam is unchanged. IMPRESSION: No active disease. Electronically Signed   By: Marin Olp M.D.   On: 04/23/2018 21:26   Ct Head Wo Contrast  Result Date: 04/23/2018 CLINICAL DATA:  67 year old female with altered mental status. EXAM: CT HEAD WITHOUT CONTRAST TECHNIQUE: Contiguous axial images were obtained from the base of the skull through the vertex without intravenous contrast. COMPARISON:  Head CT dated 01/28/2018 FINDINGS: Brain: The ventricles and sulci appropriate size for patient's age. The gray-white matter discrimination is preserved. There is no acute intracranial hemorrhage. No mass effect or midline shift. No extra-axial fluid collection. Vascular: No hyperdense vessel or unexpected calcification. Skull: Normal. Negative for fracture or focal lesion. Sinuses/Orbits: No acute finding. Other: None IMPRESSION: Unremarkable noncontrast CT of the brain for age. Electronically Signed   By: Anner Crete M.D.   On: 04/23/2018 21:46    Procedures Procedures (including critical care time)  Medications Ordered in ED Medications  sodium chloride 0.9 % bolus 1,000 mL (0 mLs Intravenous Stopped 04/24/18 0140)     Initial Impression / Assessment and Plan / ED Course  I have reviewed the triage vital signs and the nursing notes.  Pertinent labs & imaging results that were available during my care of the patient were reviewed by me and considered in my medical decision making (see chart for details).  Clinical Course as of Apr 24 1125  Thu Apr 23, 2018  2130 On review of patient's prior  admissions there was an ED visit a few months ago for patient overmedicated on narcotics.   [MB]  2244 Patient CT head is negative for any acute findings.  Her lab work is pending.  She is likely to be discharged once her  lab work is resulted as long as we do not find any other significant cause of her somnolence.  I think this is most likely to the patient being overmedicated.   [MB]  2307 Patient's roommate is here now and she agrees that the patient took an extra dose of her pain medicine.  She also feels like she may be dehydrated because she does not eat or drink very much.   [MB]    Clinical Course User Index [MB] Hayden Rasmussen, MD      Final Clinical Impressions(s) / ED Diagnoses   Final diagnoses:  Somnolence  Medication adverse effect, initial encounter    ED Discharge Orders    None       Hayden Rasmussen, MD 04/24/18 1126

## 2018-04-23 NOTE — ED Notes (Signed)
Two unsuccessful attempt to draw blood.

## 2018-04-23 NOTE — ED Triage Notes (Signed)
Pt BIB [facility] from home, pt took 1 xanax and 1 oxycodone that were not prescribed to her for gastroparesis pain. Pt responsive to voice and touch. EMS VSS.

## 2018-04-23 NOTE — ED Notes (Signed)
Pt placed on 3 lpm.

## 2018-04-24 DIAGNOSIS — Z79899 Other long term (current) drug therapy: Secondary | ICD-10-CM | POA: Diagnosis not present

## 2018-04-24 DIAGNOSIS — Z87891 Personal history of nicotine dependence: Secondary | ICD-10-CM | POA: Diagnosis not present

## 2018-04-24 DIAGNOSIS — K660 Peritoneal adhesions (postprocedural) (postinfection): Secondary | ICD-10-CM | POA: Diagnosis not present

## 2018-04-24 DIAGNOSIS — J209 Acute bronchitis, unspecified: Secondary | ICD-10-CM | POA: Diagnosis not present

## 2018-04-24 DIAGNOSIS — Z23 Encounter for immunization: Secondary | ICD-10-CM | POA: Diagnosis not present

## 2018-04-24 LAB — CBC WITH DIFFERENTIAL/PLATELET
Abs Immature Granulocytes: 0.04 10*3/uL (ref 0.00–0.07)
BASOS ABS: 0.1 10*3/uL (ref 0.0–0.1)
BASOS PCT: 1 %
EOS PCT: 1 %
Eosinophils Absolute: 0.1 10*3/uL (ref 0.0–0.5)
HCT: 38 % (ref 36.0–46.0)
HEMOGLOBIN: 11.5 g/dL — AB (ref 12.0–15.0)
Immature Granulocytes: 0 %
Lymphocytes Relative: 12 %
Lymphs Abs: 1.2 10*3/uL (ref 0.7–4.0)
MCH: 29.5 pg (ref 26.0–34.0)
MCHC: 30.3 g/dL (ref 30.0–36.0)
MCV: 97.4 fL (ref 80.0–100.0)
MONO ABS: 0.9 10*3/uL (ref 0.1–1.0)
Monocytes Relative: 9 %
NEUTROS ABS: 7.4 10*3/uL (ref 1.7–7.7)
NRBC: 0 % (ref 0.0–0.2)
Neutrophils Relative %: 77 %
PLATELETS: 319 10*3/uL (ref 150–400)
RBC: 3.9 MIL/uL (ref 3.87–5.11)
RDW: 14.4 % (ref 11.5–15.5)
WBC: 9.7 10*3/uL (ref 4.0–10.5)

## 2018-04-24 LAB — COMPREHENSIVE METABOLIC PANEL
ALT: 12 U/L (ref 0–44)
AST: 19 U/L (ref 15–41)
Albumin: 3.4 g/dL — ABNORMAL LOW (ref 3.5–5.0)
Alkaline Phosphatase: 88 U/L (ref 38–126)
Anion gap: 11 (ref 5–15)
BILIRUBIN TOTAL: 0.4 mg/dL (ref 0.3–1.2)
BUN: 7 mg/dL — ABNORMAL LOW (ref 8–23)
CHLORIDE: 104 mmol/L (ref 98–111)
CO2: 21 mmol/L — ABNORMAL LOW (ref 22–32)
Calcium: 8.3 mg/dL — ABNORMAL LOW (ref 8.9–10.3)
Creatinine, Ser: 1.08 mg/dL — ABNORMAL HIGH (ref 0.44–1.00)
GFR calc non Af Amer: 52 mL/min — ABNORMAL LOW (ref >60)
Glucose, Bld: 100 mg/dL — ABNORMAL HIGH (ref 70–99)
POTASSIUM: 4.1 mmol/L (ref 3.5–5.1)
Sodium: 136 mmol/L (ref 135–145)
TOTAL PROTEIN: 5.9 g/dL — AB (ref 6.5–8.1)

## 2018-04-24 NOTE — ED Provider Notes (Signed)
Care assumed from Dr. Melina Copa at shift change.  Patient took Percocet and Xanax which belonged to her roommate and has been unsteady on her feet since.  Nothing in her work-up provides an alternate explanation for these symptoms.  Her laboratory studies are unremarkable as is CT scan of the head.  She was observed in the ER for several hours and symptoms have improved.  At this point, I see no indication for admission or further work-up.  She has been somnolent, likely related to these medications, but does appear appropriate for discharge.   Veryl Speak, MD 04/24/18 [phone]

## 2018-04-24 NOTE — ED Notes (Signed)
Assisted to toilet to attempt to get urine sample, pt fell asleep on toilet and did not provide sample. Very unsteady on feet.

## 2018-04-24 NOTE — ED Notes (Signed)
Patient left at this time with all belongings. Moved to waiting room to await ride. Refused to wear oxygen to waiting room

## 2018-04-24 NOTE — Discharge Instructions (Signed)
Continue your medications as previously prescribed.  Follow-up with your primary doctor if you experience additional issues.

## 2018-04-24 NOTE — ED Notes (Signed)
Called and left message for patient's ride

## 2018-05-01 ENCOUNTER — Other Ambulatory Visit: Payer: Self-pay

## 2018-05-01 NOTE — Patient Outreach (Signed)
[facility]) Care Management  05/01/2018  [name] Jan 29, 1951 [id number]   Referral Date: 04/29/18 Referral Source: [facility] Referral Reason: Transportation Assistance   Outreach Attempt: Telephone call to patient.  No answer.  HIPAA compliant voice message left.     Plan: RN CM will attempt patient again within 4 business days and send letter.    Jone Baseman, RN, MSN [facility] Care Management Care Management Coordinator Direct Line [phone] Toll Free: [phone]  Fax: [phone]

## 2018-05-04 ENCOUNTER — Telehealth: Payer: Self-pay | Admitting: Family Medicine

## 2018-05-04 ENCOUNTER — Other Ambulatory Visit: Payer: Self-pay

## 2018-05-04 DIAGNOSIS — Z87891 Personal history of nicotine dependence: Secondary | ICD-10-CM | POA: Diagnosis not present

## 2018-05-04 DIAGNOSIS — R509 Fever, unspecified: Secondary | ICD-10-CM | POA: Diagnosis not present

## 2018-05-04 DIAGNOSIS — J018 Other acute sinusitis: Secondary | ICD-10-CM | POA: Diagnosis not present

## 2018-05-04 NOTE — Patient Outreach (Signed)
[facility]) Care Management  05/04/2018  [name] [birth year] [id number]   Referral Date: 04/29/18 Referral Source: [facility] Referral Reason: Transportation Assistance   Outreach Attempt: Telephone call to patient.  She is able to verify HIPAA.  Discussed with patient reason for referral. Patient reports that she is legally blind and does not drive any more and needs transportation to appointments.  Also spoke with roommate [name].  [name] states that sometimes patient has appointments and she cannot take her and needing some resources.  She also is requesting help with meals on wheels and resources for the blind.   Patient admits to being blind in the left eye, stomach problems, and COPD. Discussed with patient [place] and how we can support patient.  She is agreeable to social work presently for transportation, meals on wheels, and resources for the blind.    Plan: RN CM will refer to social work for resources.    Jone Baseman, RN, MSN [facility] Care Management Coordinator Direct Line [phone] Cell [phone] Toll Free: [phone]  Fax: [phone]

## 2018-05-04 NOTE — Telephone Encounter (Signed)
Copied from [place] [phone]. Topic: Quick Communication - See Telephone Encounter >> May 04, 2018 11:22 AM Blase Mess A wrote: CRM for notification. See Telephone encounter for: 05/04/18.[name], Patient roommate is calling to requesting a letter for in home health & care. Patient is legally blind.   If Dr. Brigitte Pulse could write to whom it is concern. Please advise [phone]

## 2018-05-06 ENCOUNTER — Other Ambulatory Visit: Payer: Self-pay

## 2018-05-06 NOTE — Patient Outreach (Signed)
 [facility]) Care Management  05/06/2018  [name] 1950/12/15 [id number]  BSW placed a call to the patient's DPR [name] whom is able to provide patients HIPAA identifiers. BSW introduced self and the reason for today's call, indicating this BSW received a referral to assist with community resource needs. The patient is legally blind and lives in the home with [place]. The patient receives disability and has a trust fund which was set up by her mother who is now deceased. The patient is in need of resources related to transportation and an in-home caregiver.  BSW educated [name] on the patients [facility]) benefits including transportation and over the [employer]. BSW to mail information to the home. BSW also to mail a list of in home providers who serve [employer] for a private caregiver. [name] indicates the patient is not interested in placement and would rather be cared for in the home. [name] states the patients trust fund is set up to cover the cost of caregiver needs.  BSW discussed other disciplines within [facility] and offered to assign an RN CM to the patient. Nursing services were declined during today's call.  Plan: BSW to outreach the patient in the next three weeks to confirm receipt of mailed resources.

## 2018-05-07 ENCOUNTER — Ambulatory Visit: Payer: Self-pay

## 2018-05-07 NOTE — Telephone Encounter (Signed)
Please advise 

## 2018-05-08 DIAGNOSIS — Z87891 Personal history of nicotine dependence: Secondary | ICD-10-CM | POA: Diagnosis not present

## 2018-05-08 DIAGNOSIS — R0989 Other specified symptoms and signs involving the circulatory and respiratory systems: Secondary | ICD-10-CM | POA: Diagnosis not present

## 2018-05-08 DIAGNOSIS — J209 Acute bronchitis, unspecified: Secondary | ICD-10-CM | POA: Diagnosis not present

## 2018-05-08 DIAGNOSIS — R5383 Other fatigue: Secondary | ICD-10-CM | POA: Diagnosis not present

## 2018-05-21 DIAGNOSIS — J449 Chronic obstructive pulmonary disease, unspecified: Secondary | ICD-10-CM | POA: Diagnosis not present

## 2018-05-22 ENCOUNTER — Encounter: Payer: Self-pay | Admitting: Family Medicine

## 2018-05-22 ENCOUNTER — Ambulatory Visit: Payer: Self-pay

## 2018-05-22 ENCOUNTER — Ambulatory Visit: Payer: Medicare Other | Admitting: Family Medicine

## 2018-05-22 VITALS — BP 105/71 | HR 99 | Temp 98.2°F | Resp 18 | Ht 64.0 in | Wt 92.8 lb

## 2018-05-22 DIAGNOSIS — Z1231 Encounter for screening mammogram for malignant neoplasm of breast: Secondary | ICD-10-CM

## 2018-05-22 DIAGNOSIS — Z23 Encounter for immunization: Secondary | ICD-10-CM

## 2018-05-22 DIAGNOSIS — Z903 Acquired absence of stomach [part of]: Secondary | ICD-10-CM | POA: Diagnosis not present

## 2018-05-22 DIAGNOSIS — E559 Vitamin D deficiency, unspecified: Secondary | ICD-10-CM

## 2018-05-22 DIAGNOSIS — R110 Nausea: Secondary | ICD-10-CM

## 2018-05-22 DIAGNOSIS — R634 Abnormal weight loss: Secondary | ICD-10-CM

## 2018-05-22 DIAGNOSIS — Z1211 Encounter for screening for malignant neoplasm of colon: Secondary | ICD-10-CM

## 2018-05-22 DIAGNOSIS — E46 Unspecified protein-calorie malnutrition: Secondary | ICD-10-CM | POA: Diagnosis not present

## 2018-05-22 MED ORDER — ONDANSETRON 8 MG PO TBDP: 8.0000 mg | ORAL_TABLET | Freq: Three times a day (TID) | ORAL | 5 refills | Status: DC | PRN

## 2018-05-22 NOTE — Patient Instructions (Signed)
° ° ° °  If you have lab work done today you will be contacted with your lab results within the next 2 weeks.  If you have not heard from us then please contact us. The fastest way to get your results is to register for My Chart. ° ° °IF you received an x-ray today, you will receive an invoice from [facility]. Please contact [facility] at [phone] with questions or concerns regarding your invoice.  ° °IF you received labwork today, you will receive an invoice from [facility]. Please contact [facility] at [phone] with questions or concerns regarding your invoice.  ° °Our billing staff will not be able to assist you with questions regarding bills from these companies. ° °You will be contacted with the lab results as soon as they are available. The fastest way to get your results is to activate your My Chart account. Instructions are located on the last page of this paperwork. If you have not heard from us regarding the results in 2 weeks, please contact this office. °  ° ° ° °

## 2018-05-22 NOTE — Telephone Encounter (Signed)
Rec'd call from pt's. friend / caregiver. (on DPR)  Reported concern for increased weakness, shakiness, and pale color over past 3-4 days.  Reported the patient has been sleeping more, and is asleep at this time.  Requesting an appt. Today for evaluation.  Stated the pt. Has hx of gastroparesis, and has intermittent abdominal pain with this condition. Denied that pt. Has any other c/o pain.  Denied recent c/o nausea or vomiting.  Denied fever at present.  Reported temp. of 99.5 on Wed.  Questioned about hydration status.  Reported the pt. "won't drink water; she drinks Coke."  Stated she is still eating. Denied any decrease in, or absence of urine output.  Reported "she sleeps a lot, but is alert when she is awake."  Reported able to stand and walk, but is "weak and shaky."  Stated "she felt a little dizzy yesterday".  Stated pt. Is a smoker, and has a cough.  Denied that the cough has changed from her typical cough with smoking.  Stated has some shortness of breath, when she smokes, but doesn't seem to have more labored breathing.   Advised clinical recommendation would be to have pt. Eval. Within 4 hrs., due to her weakness.  Recommended she go to UC or ER.  Friend stated, "she won't go to the ER, because she had to wait 7 hrs., the last time she was there. I just want someone at the office to look at her."  Declined going to UC.  Advised even with office visit, she may still be directed to go to ER.  Friend requested to sched. Appt.  Sched. Appt. At 4:00 PM at PCP office.  Care advice given per protocol.  Friend agreed to call back if condition worsens, prior to 4:00 PM appt.                 Reason for Disposition . [1] MODERATE weakness (i.e., interferes with work, school, normal activities) AND [2] cause unknown  (Exceptions: weakness with acute minor illness, or weakness from poor fluid intake)  Answer Assessment - Initial Assessment Questions 1. DESCRIPTION: "Describe how you are feeling."     States  she doesn't feel good  2. SEVERITY: "How bad is it?"  "Can you stand and walk?"   - MILD - Feels weak or tired, but does not interfere with work, school or normal activities   - [place] to stand and walk; weakness interferes with work, school, or normal activities   - SEVERE - Unable to stand or walk     moderate 3. ONSET:  "When did the weakness begin?"     3-4 days ago  4. CAUSE: "What do you think is causing the weakness?"     unknown 5. MEDICINES: "Have you recently started a new medicine or had a change in the amount of a medicine?"     Denied  6. OTHER SYMPTOMS: "Do you have any other symptoms?" (e.g., chest pain, fever, cough, SOB, vomiting, diarrhea, bleeding, other areas of pain)     Dizzy, some coughing - is a smoker; no c/o nausea or vomiting, c/o abdominal pain; hx of gastroparesis, shaking   7. PREGNANCY: "Is there any chance you are pregnant?" "When was your last menstrual period?"     N/a  Protocols used: WEAKNESS (GENERALIZED) AND FATIGUE-A-AH

## 2018-05-22 NOTE — Progress Notes (Signed)
11/29/20194:17 PM  [name] 01/25/1951, 67 y.o. female [id number]  Chief Complaint  Patient presents with  . Fatigue    states she just feels bad  . not eating    only eating small amounts of food  . trouble breathing    HPI:   Patient is a 67 y.o. female with past medical history significant for COPD, s/p gastric bypass surgery, low bmi, macular degeneration, chronic pain on opiates, among others who presents today for ER followup  PCP Dr Brigitte Pulse  She has been feeling weak and dizzy for past several weeks Is losing weight despite eating Does not like to drink water Having night sweats, always nauseous, normal bowel movements No chest pain, no sob, unsble to vomit as she had fundoplication surgery  Seen in ER on 04/23/18 for somnolence after taking percocet and xanax that were from her roommate Normal head ct scan and CXR CBC hgb 11 otherwise normal CMP crt 1.08, low protein and albumin  Has been in the ER 4 times in Oct  Pain mgt in [place], [place] Fall Risk  05/22/2018 12/18/2017 10/17/2017 09/10/2017 08/30/2017  Falls in the past year? 1 No No No No  Number falls in past yr: 1 - - - -  Injury with Fall? 0 - - - -  Risk for fall due to : - - - - -     Depression screen [facility] 2/9 05/04/2018 12/18/2017 09/10/2017  Decreased Interest 0 0 0  Down, Depressed, Hopeless 0 0 0  PHQ - 2 Score 0 0 0  Altered sleeping - - -  Tired, decreased energy - - -  Change in appetite - - -  Feeling bad or failure about yourself  - - -  Trouble concentrating - - -  Moving slowly or fidgety/restless - - -  Suicidal thoughts - - -  PHQ-9 Score - - -  Difficult doing work/chores - - -  Some recent data might be hidden    Allergies  Allergen Reactions  . Lithium Nausea And Vomiting  . Celebrex [Celecoxib] Other (See Comments)    History of bleeding ulcers  . Morphine Itching  . Quetiapine Other (See Comments)     tardive dyskinesia  . Varenicline Tartrate Other (See Comments)   hallucinations    Prior to Admission medications   Medication Sig Start Date End Date Taking? Authorizing Provider  albuterol (PROVENTIL HFA;VENTOLIN HFA) 108 (90 Base) MCG/ACT inhaler Inhale 2 puffs into the lungs every 4 (four) hours as needed (cough, shortness of breath or wheezing.). 08/01/15  Yes Shawnee Knapp, MD  Aspirin-Acetaminophen (GOODYS BODY PAIN PO) Take 1-2 packets by mouth every 8 (eight) hours as needed (pain).    Yes [provider]  cefdinir (OMNICEF) 300 MG capsule  05/04/18  Yes [provider]  CHANTIX STARTING MONTH PAK 0.5 MG X 11 & 1 MG X 42 tablet  05/04/18  Yes [provider]  DULoxetine (CYMBALTA) 30 MG capsule Take 90 mg by mouth daily.    Yes [provider]  DULoxetine (CYMBALTA) 60 MG capsule  04/28/18  Yes [provider]  gabapentin (NEURONTIN) 300 MG capsule  04/29/18  Yes [provider]  HYDROcodone-acetaminophen (NORCO/VICODIN) 5-325 MG tablet Take 1 tablet by mouth every 6 (six) hours as needed for moderate pain.  12/12/17  Yes [provider]  ondansetron (ZOFRAN-ODT) 8 MG disintegrating tablet Take 1 tablet (8 mg total) by mouth every 8 (eight) hours as needed for  nausea. 12/19/17  Yes Shawnee Knapp, MD  oxyCODONE (OXY IR/ROXICODONE) 5 MG immediate release tablet Take 1 tablet (5 mg total) by mouth every 6 (six) hours as needed for severe pain. 12/02/17  Yes Johnathan Hausen, MD    Past Medical History:  Diagnosis Date  . Acute duodenal ulcer with hemorrhage and perforation, with obstruction ([place]) 06/26/2004   multiple pyloric perferations  . Allergy   . Anemia   . Anxiety    sees [name] NP at Dr. Radonna Ricker office  . Asthma    hx of   . Barrett's esophagus   . Chronic abdominal pain    narcotic dependence, dr gyarteng-dak at [facility]  . 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 Complication of anesthesia    pt states had too much xanax on board -  agitated   . COPD (chronic obstructive pulmonary disease) ([place])   . Depression   . Exertional shortness of breath   . Fall 11/23/2014   frequent falls  . Fx of fibula 07-28-11   left fibula, 2 places   . Fx two ribs-open 08-24-11   left 4th and 5th  . GASTRIC OUTLET OBSTRUCTION 08/28/2007   In July 2008 she underwent laparoscopic enterolysis, Nissen fundoplication over a #55 bougie, single pledgeted suture colosure of the hiatus and a 3 suture wrap.  Lap truncal vagotomy and a loop gastrojejunostomy was performed.  This was revised in August of 2009 to a roux en Y gastrojejujostomy.     . Gastroparesis 06/03/2007   Qualifier: Diagnosis of  By: [name] MD, [name]   . GERD (gastroesophageal reflux disease)   . History of blood transfusion    "related to OR; maybe when I perforated that ulcer"  . History of hiatal hernia   . Lap Nissen + truncal vagotomy July 2008 05/13/2013  . LEG EDEMA 01/19/2008   Qualifier: Diagnosis of  By: [name] MD, [name]   . Macular degeneration, bilateral    early onset, severe, legally blind and worsening. Followed by dr. Katy Fitch  . MENOPAUSE 01/07/2007   Qualifier: Diagnosis of  By: [name], CMA, [place]    . Narcotic abuse ([place])   . Osteoporosis    severe  . Oxygen deficiency   . Oxygen dependent    3L/[place]   . Peptic ulcer disease    dr Oletta Lamas  . Pneumonia, organism unspecified(486) 11/07/2010   Assoc with R Parapneumonic effusion 09/2010    - Tapped 10/05/10    - CxR resolved 11/07/2010    . S/P jejunostomy Feb 2016 07/26/2014  . Thyroid nodule 2013   benign after biopsy w/ Dr. Redmond Baseman    Past Surgical History:  Procedure Laterality Date  . BIOPSY THYROID  08-13-11   benign nodule, per Dr. Melida Quitter   . CATARACT EXTRACTION W/ INTRAOCULAR LENS  IMPLANT, BILATERAL Bilateral   . CHOLECYSTECTOMY N/A 12/02/2017   Procedure: LAPAROSCOPIC CHOLECYSTECTOMY WITH INTRAOPERATIVE CHOLANGIOGRAM ERAS PATHWAY;  Surgeon: Johnathan Hausen, MD;  Location: [facility];  Service: General;   Laterality: N/A;  . COLONOSCOPY    . DILATION AND CURETTAGE OF UTERUS    . ESOPHAGOGASTRODUODENOSCOPY  12-29-09   dr qadeer at [facility], several gastric ulcers  . ESOPHAGOGASTRODUODENOSCOPY N/A 09/25/2012   Procedure: ESOPHAGOGASTRODUODENOSCOPY (EGD);  Surgeon: Beryle Beams, MD;  Location: [facility];  Service: Endoscopy;  Laterality: N/A;  . ESOPHAGOGASTRODUODENOSCOPY (EGD) WITH PROPOFOL N/A 06/27/2017   Procedure: ESOPHAGOGASTRODUODENOSCOPY (EGD) WITH PROPOFOL;  Surgeon: Carol Ada, MD;  Location: [facility];  Service: Endoscopy;  Laterality: N/A;  . FRACTURE SURGERY Left 2010   hip and leg s/p fall  . GASTROJEJUNOSTOMY  02/20/2008   Roux en Y gastrojejunsotomy w 1 foot Roux limb, laparotomy, takedown loop gastrojejunostomy with removal of Ntinol stent  . GASTROSTOMY N/A 07/26/2014   Procedure: LAPRASCOPIC ASSISTED OPEN PLACEMENT OF JEJUNOSTOMY TUBE;  Surgeon: Pedro Earls, MD;  Location: [facility];  Service: General;  Laterality: N/A;  . HERNIA REPAIR     "hiatal"  . JOINT REPLACEMENT    . LAPAROSCOPIC NISSEN FUNDOPLICATION  5102  . LAPAROSCOPIC PARTIAL GASTRECTOMY N/A 06/29/2013   Procedure: Gastrectomy and GJ Tube placement;  Surgeon: Pedro Earls, MD;  Location: [facility];  Service: General;  Laterality: N/A;  . ORIF FEMORAL NECK FRACTURE W/ DHS Right 03/2014  . ORIF HIP FRACTURE Left 2010  . REPAIR OF PERFORATED ULCER    . TONSILLECTOMY  1974  . TRUNCAL VAGOTOMY  12/2006   Laparoscopic enterolysis, laparoscopic Nissen's Fundoplication, laparoscopic truncal vagotomy & loop gastrojejunostomy    Social History   Tobacco Use  . Smoking status: Current Every Day Smoker    Packs/day: 1.00    Years: 51.00    Pack years: 51.00    Types: Cigarettes  . Smokeless tobacco: Never Used  Substance Use Topics  . Alcohol use: No    Alcohol/week: 0.0 standard drinks    Family History  Problem Relation Age of Onset  . Cancer Mother        kidney/bladder cancer, magliant skin cancer  on face, cervical cancer  . Stroke Mother   . Hypertension Mother   . Celiac disease Father   . Cancer Father        metastatic renal cell carcinoma, pancreatic cancer, colon polyps  . Colon cancer Maternal Uncle   . Breast cancer Neg Hx   . Ovarian cancer Neg Hx   . Endometrial cancer Neg Hx     ROS Per hpi  OBJECTIVE:  Blood pressure 105/71, pulse 99, temperature 98.2 F (36.8 C), temperature source Oral, resp. rate 18, height 5\' 4"  (1.626 m), weight 92 lb 12.8 oz (42.1 kg), SpO2 99 %. Body mass index is 15.93 kg/m.   Wt Readings from Last 3 Encounters:  05/22/18 92 lb 12.8 oz (42.1 kg)  04/13/18 100 lb 8.5 oz (45.6 kg)  04/09/18 100 lb (45.4 kg)    Physical Exam  Constitutional: She is oriented to person, place, and time. She does not appear ill.  HENT:  Head: Normocephalic and atraumatic.  Mouth/Throat: Oropharynx is clear and moist. No oropharyngeal exudate.  Eyes: Pupils are equal, round, and reactive to light. Conjunctivae and EOM are normal. No scleral icterus.  Neck: Neck supple.  Cardiovascular: Normal rate, regular rhythm and normal heart sounds. Exam reveals no gallop and no friction rub.  No murmur heard. Pulmonary/Chest: Effort normal and breath sounds normal. She has no wheezes. She has no rales.  Abdominal: Soft. Bowel sounds are normal. She exhibits no distension and no mass. There is no tenderness.  Musculoskeletal: She exhibits no edema.  Neurological: She is alert and oriented to person, place, and time.  Skin: Skin is warm and dry.  Psychiatric: She has a normal mood and affect.  Nursing note and vitals reviewed.  ASSESSMENT and PLAN  1. Weight loss Discussed increase protein intake. Discussed supplements. Labs to r/o more specific nutritional deficiencies. Discussed r/o malignancy per HCM guidelines.  - CBC - TSH - Vitamin B12 -  Vitamin B1 - Ferritin  2. History of subtotal gastrectomy with Roux-en-Y recontruction - CBC - Vitamin B12 -  Vitamin B1 - Ferritin - Calcium, ionized  3. Vitamin D deficiency - Vitamin D, 25-hydroxy  4. Protein-calorie malnutrition, unspecified severity ([place])  5. Screening for colon cancer - Ambulatory referral to Gastroenterology  6. Visit for screening mammogram - MM DIGITAL SCREENING BILATERAL; Future  7. Nausea without vomiting - ondansetron (ZOFRAN-ODT) 8 MG disintegrating tablet; Take 1 tablet (8 mg total) by mouth every 8 (eight) hours as needed for nausea.  Other orders  - Flu vaccine HIGH DOSE PF (Fluzone High dose)  Return in about 4 weeks (around 06/19/2018) for Dr Brigitte Pulse.    Rutherford Guys, MD Primary Care at [place], [place] [address] Ph.  [phone] Fax [phone]

## 2018-05-24 IMAGING — CT CT CHEST LUNG CANCER SCREENING LOW DOSE W/O CM
2 of 4 series · 15 of 40 positions shown, 18 images · non-contrast
Comparison: 10/04/2014.

CLINICAL DATA: Current smoker, 51 pack-year history, lung cancer
screening.

EXAM:
CT CHEST WITHOUT CONTRAST LOW-DOSE FOR LUNG CANCER SCREENING
TECHNIQUE: Multidetector CT imaging of the chest was performed following the
standard protocol without IV contrast.

[Series 2: thorax 5.0 i31f 3 · axial · 0.64mm/px · z∈[-310,-40]mm · 12 of 64 slices shown, 15 images]
[im 5/64  mediastinal]
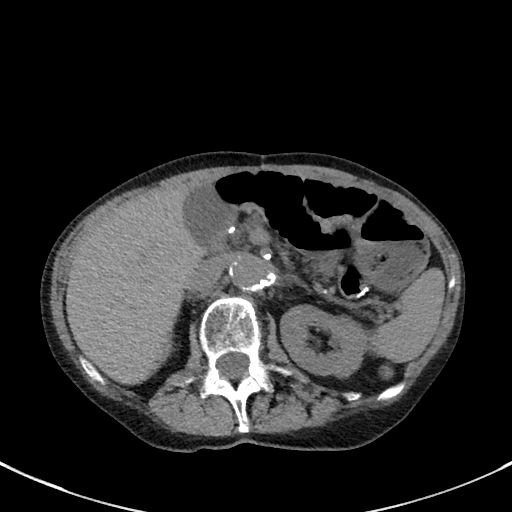
[im 5/64  lung]
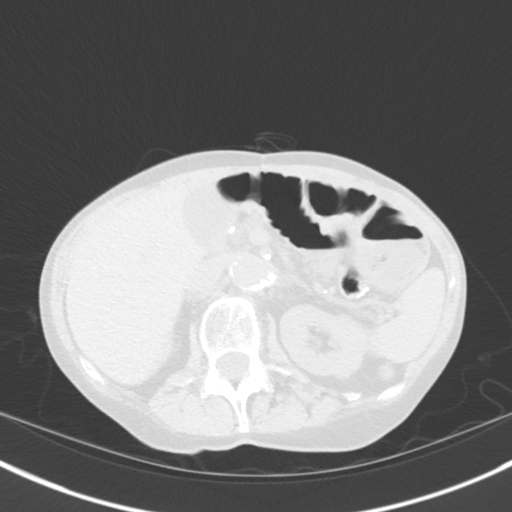
[im 10/64  lung]
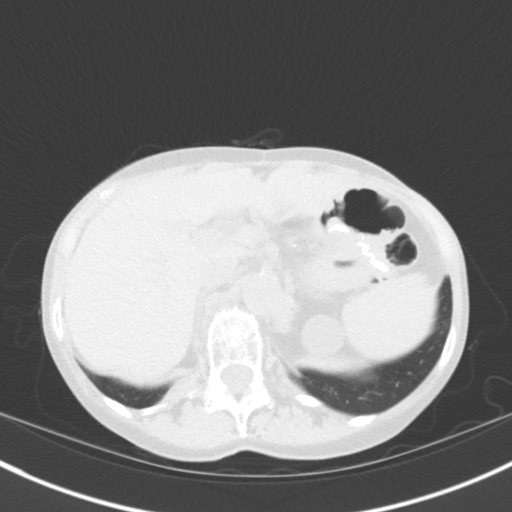
[im 15/64  lung]
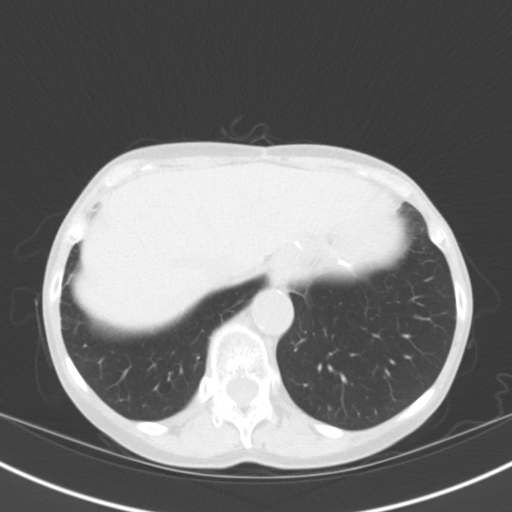
[im 20/64  lung]
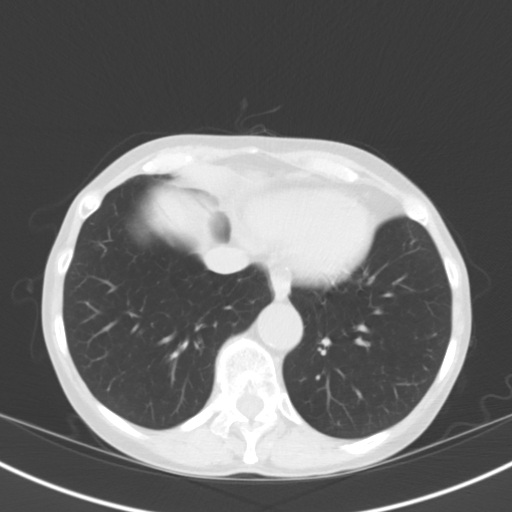
[im 25/64  mediastinal]
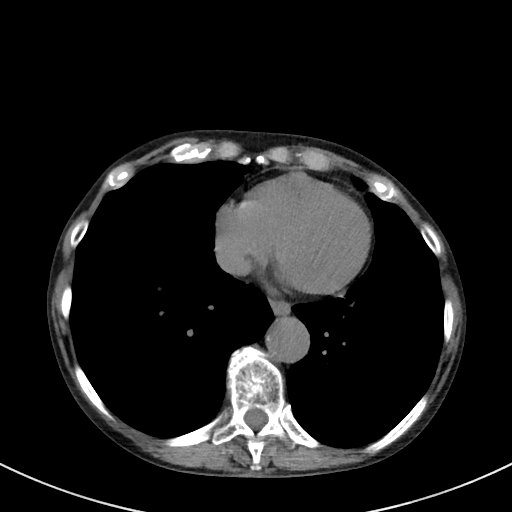
[im 25/64  lung]
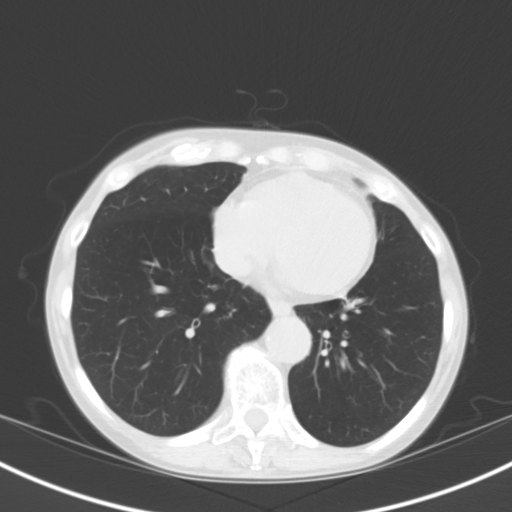
[im 30/64  lung]
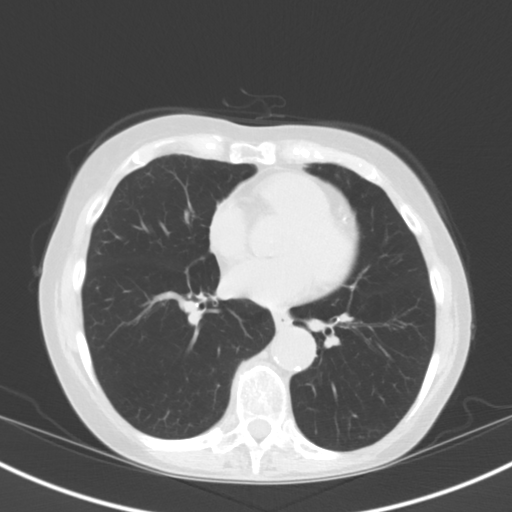
[im 34/64  lung]
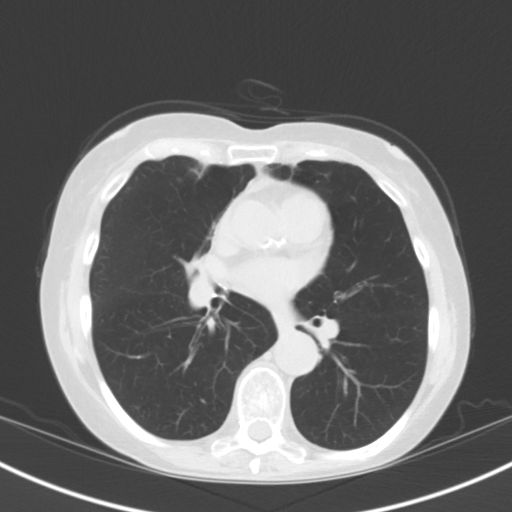
[im 39/64  lung]
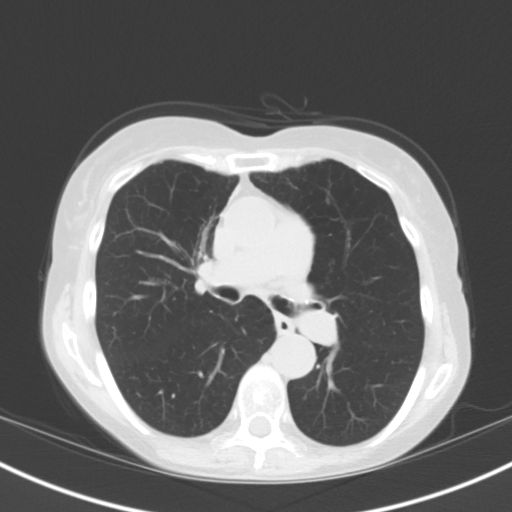
[im 44/64  mediastinal]
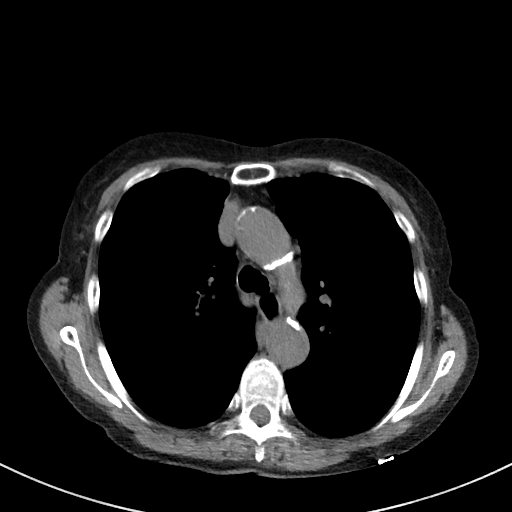
[im 44/64  lung]
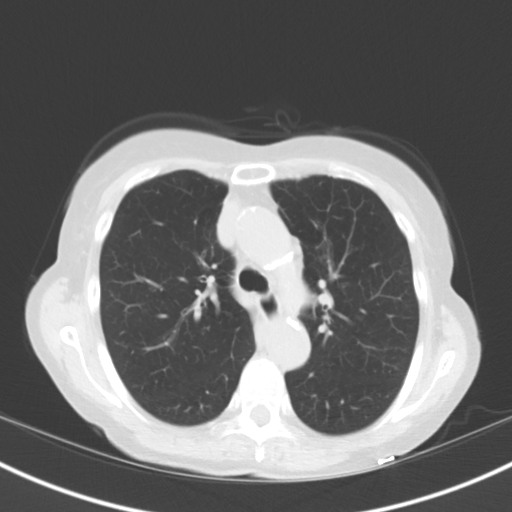
[im 49/64  lung]
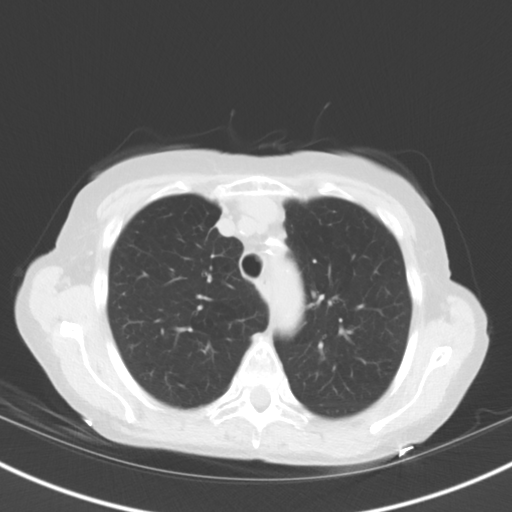
[im 54/64  lung]
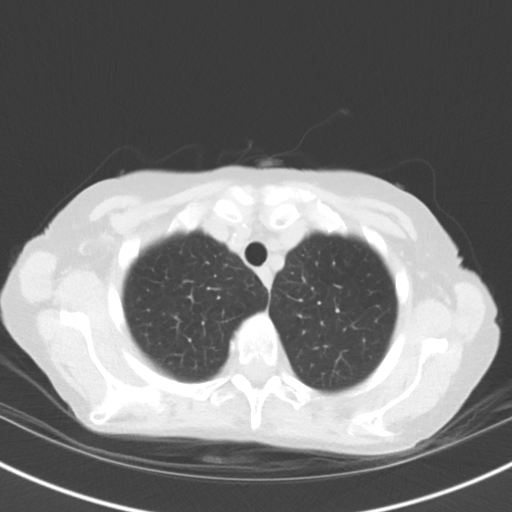
[im 59/64  lung]
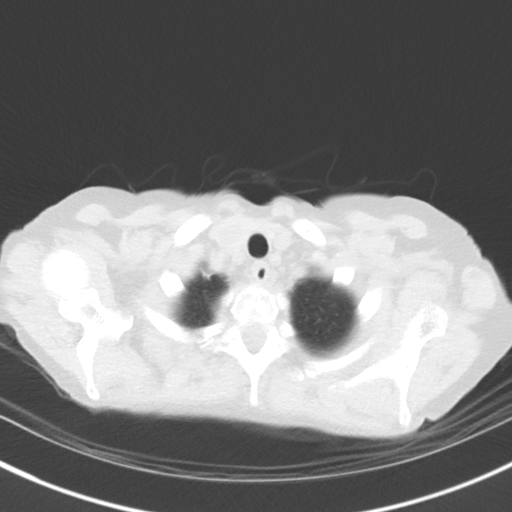

[Series 5: coronal · coronal · 0.59mm/px · 3 of 114 slices shown]
[im 23/114  lung]
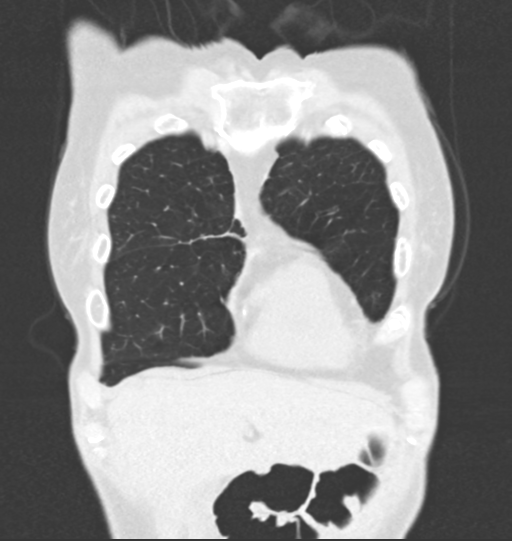
[im 46/114  lung]
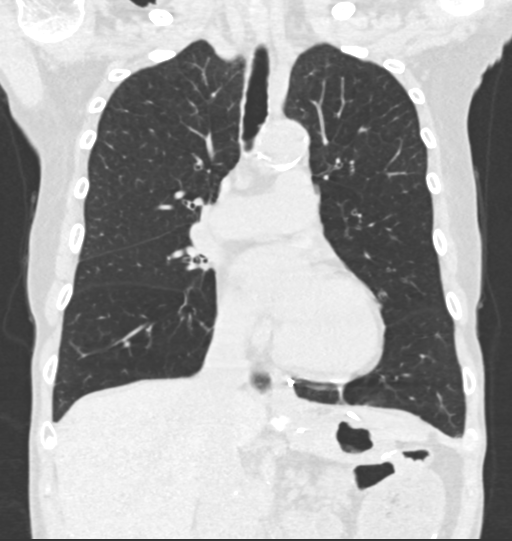
[im 68/114  lung]
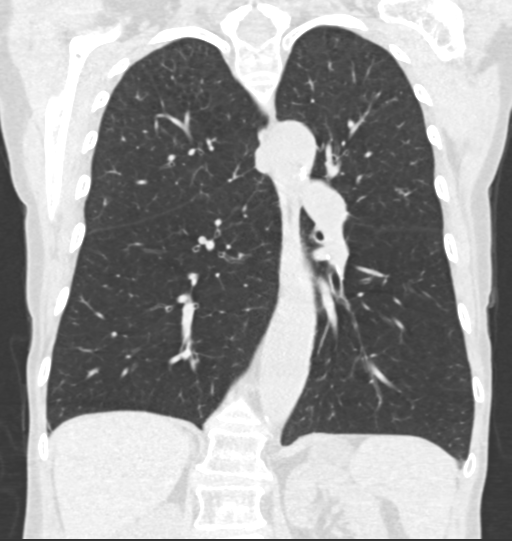

[15 of 40 positions shown; findings below may reference images not displayed]

FINDINGS: Cardiovascular: Atherosclerotic calcification of the arterial
vasculature, including coronary arteries and aortic valve. Heart
size normal. No pericardial effusion.

Mediastinum/Nodes: 1.4 cm low-attenuation lesion in the right lobe
of the thyroid is unchanged. No pathologically enlarged mediastinal
or axillary lymph nodes. Hilar regions are difficult to evaluate
without IV contrast but appear grossly unremarkable. Esophagus is
grossly unremarkable.

Lungs/Pleura: Centrilobular emphysema. Scattered pulmonary
parenchymal scarring. No worrisome pulmonary nodules. No pleural
fluid. Airway is unremarkable.

Upper Abdomen: Visualized portions of the liver, gallbladder and
adrenal glands are unremarkable. There may be a tiny stone in the
upper pole right kidney as well as within the left kidney. 2.6 cm
low-attenuation lesion off upper pole left kidney is unchanged and
likely a cyst. Visualized portions of the spleen and pancreas are
unremarkable. Postoperative changes involving the stomach.

Musculoskeletal: Degenerative changes in the spine. No worrisome
lytic or sclerotic lesions.
IMPRESSION: 1. Lung-RADS 1, negative. Continue annual screening with low-dose
chest CT without contrast in 12 months.
2. Aortic atherosclerosis (LB9C0-170.0). Coronary artery
calcification.
3.  Emphysema (LB9C0-7ZF.V).
4. Possible tiny renal stones.

## 2018-05-25 ENCOUNTER — Ambulatory Visit: Payer: Self-pay

## 2018-05-25 DIAGNOSIS — R110 Nausea: Secondary | ICD-10-CM | POA: Diagnosis not present

## 2018-05-25 DIAGNOSIS — K660 Peritoneal adhesions (postprocedural) (postinfection): Secondary | ICD-10-CM | POA: Diagnosis not present

## 2018-05-25 DIAGNOSIS — E860 Dehydration: Secondary | ICD-10-CM | POA: Diagnosis not present

## 2018-05-25 DIAGNOSIS — Z79899 Other long term (current) drug therapy: Secondary | ICD-10-CM | POA: Diagnosis not present

## 2018-05-25 LAB — CBC
Hematocrit: 39.9 % (ref 34.0–46.6)
Hemoglobin: 13 g/dL (ref 11.1–15.9)
MCH: 29.7 pg (ref 26.6–33.0)
MCHC: 32.6 g/dL (ref 31.5–35.7)
MCV: 91 fL (ref 79–97)
Platelets: 344 10*3/uL (ref 150–450)
RBC: 4.37 x10E6/uL (ref 3.77–5.28)
RDW: 13.1 % (ref 12.3–15.4)
WBC: 8 10*3/uL (ref 3.4–10.8)

## 2018-05-25 LAB — FERRITIN: Ferritin: 57 ng/mL (ref 15–150)

## 2018-05-25 LAB — CALCIUM, IONIZED: Calcium, Ion: 5 mg/dL (ref 4.5–5.6)

## 2018-05-25 LAB — VITAMIN B1: Thiamine: 122.5 nmol/L (ref 66.5–200.0)

## 2018-05-25 LAB — TSH: TSH: 1.75 u[IU]/mL (ref 0.450–4.500)

## 2018-05-25 LAB — VITAMIN B12: Vitamin B-12: 545 pg/mL (ref 232–1245)

## 2018-05-25 LAB — VITAMIN D 25 HYDROXY (VIT D DEFICIENCY, FRACTURES): Vit D, 25-Hydroxy: 15.2 ng/mL — ABNORMAL LOW (ref 30.0–100.0)

## 2018-05-25 NOTE — Telephone Encounter (Signed)
[name], pt. Roommate (on DPR), called to schedule an appointment with Dr. Brigitte Pulse for follow up. Appointment made. Reports she is supposed to be scheduled for a colonoscopy "sometime this week." Instructed to call back as needed.

## 2018-05-26 ENCOUNTER — Other Ambulatory Visit: Payer: Self-pay

## 2018-05-26 ENCOUNTER — Ambulatory Visit: Payer: Self-pay

## 2018-05-26 NOTE — Patient Outreach (Signed)
[place] [facility]) Care Management  05/26/2018  [name] 15-Mar-1951 [id number]  Return call from the patients roommate and friend [name]. HIPAA identifiers confirmed. [name] unable to confirm receipt of mailed resources. BSW to resend and outreach the patient in the next four weeks to verify resources received.  Daneen Schick, BSW, CDP [facility] [phone]

## 2018-05-26 NOTE — Patient Outreach (Signed)
[place] [facility]) Care Management  05/26/2018  [name] Sep 08, 1950 [id number]  Unsuccessful outreach to the patient on today's date in efforts to confirm receipt of mailed resources. BSW left a HIPAA compliant voice message requesting a return call.  Plan: BSW to attempt to outreach the patient within the next four business days.  Daneen Schick, BSW, CDP [facility] [phone]

## 2018-06-01 ENCOUNTER — Ambulatory Visit: Payer: Self-pay

## 2018-06-01 MED ORDER — VITAMIN D (ERGOCALCIFEROL) 1.25 MG (50000 UNIT) PO CAPS: 50000.0000 [IU] | ORAL_CAPSULE | ORAL | 0 refills | Status: AC

## 2018-06-01 NOTE — Addendum Note (Signed)
Addended by: Rutherford Guys on: 06/01/2018 02:34 PM   Modules accepted: Orders

## 2018-06-10 DIAGNOSIS — J018 Other acute sinusitis: Secondary | ICD-10-CM | POA: Diagnosis not present

## 2018-06-10 DIAGNOSIS — J029 Acute pharyngitis, unspecified: Secondary | ICD-10-CM | POA: Diagnosis not present

## 2018-06-10 DIAGNOSIS — Z87891 Personal history of nicotine dependence: Secondary | ICD-10-CM | POA: Diagnosis not present

## 2018-06-10 DIAGNOSIS — R509 Fever, unspecified: Secondary | ICD-10-CM | POA: Diagnosis not present

## 2018-06-15 ENCOUNTER — Ambulatory Visit: Payer: Self-pay | Admitting: *Deleted

## 2018-06-15 NOTE — Telephone Encounter (Signed)
  Pt's roommate calling; on DPR. Asking what nutritious drink pt could drink until 'Breeze' comes in to pharmacy tomorrow. States pt is eating well, with encouragement, and drinking well "But only coke."  Stated pt has gained a pound since last OV. Given options to call to see if they carry 'Breeze.' Suggested apple juice, Gatorade. Also suggested she speak with pharmacist for recommendations. States she will follow recommendations for pt. Reason for Disposition . Health Information question, no triage required and triager able to answer question  Answer Assessment - Initial Assessment Questions 1. REASON FOR CALL or QUESTION: "What is your reason for calling today?" or "How can I best help you?" or "What question do you have that I can help answer?"     Questioning what nutritious drink pt could have until 'Breeze' comes in to pharmacy, tomorrow.  Protocols used: INFORMATION ONLY CALL-A-AH

## 2018-06-18 DIAGNOSIS — J018 Other acute sinusitis: Secondary | ICD-10-CM | POA: Diagnosis not present

## 2018-06-18 DIAGNOSIS — K660 Peritoneal adhesions (postprocedural) (postinfection): Secondary | ICD-10-CM | POA: Diagnosis not present

## 2018-06-18 DIAGNOSIS — Z87891 Personal history of nicotine dependence: Secondary | ICD-10-CM | POA: Diagnosis not present

## 2018-06-20 DIAGNOSIS — J449 Chronic obstructive pulmonary disease, unspecified: Secondary | ICD-10-CM | POA: Diagnosis not present

## 2018-06-22 ENCOUNTER — Other Ambulatory Visit: Payer: Self-pay

## 2018-06-22 NOTE — Patient Outreach (Signed)
[place] [facility]) Care Management  06/22/2018  MOLLEIGH HUOT 1950-09-10 [id number]  Successful outreach to the patients roommate [name], HIPAA identifiers confirmed. Unfortunately, Mrs. [name] is unable to confirm receipt of mailed resources and requests BSW send information via mail again. BSW discussed concern over multiple mailings and inquired if it would be more beneficial to send the information to the patients primary physician to be picked up at a future appointment. [name] agreed with this idea and stated she is trying to care for herself and the patient which causes her to "go through the mail slowly".   BSW reviewed items previously mailed with [name]. BSW explained the over the counter benefit offered under the patients united health care plan. BSW explained a new catalog would be mailed out by the patients payor in the next month. BSW explained in detail how to utilize this catalog for cost savings. [name] stated understanding.   BSW also re-educated on the patients transportation benefit under her health plan. [name] was pleased to hear about this benefit and did not recall previous conversations. BSW provided [place] with the contact  Number to [employer] to arrange transportation if needed. BSW also faxed this information to the patients primary care provider for the patient to obtain at her next office visit in January. BSW spoke with receptionist [name]) with Dr. Raul Del office and explained the reason for today's fax. [name] stated understanding and indicated the patient would receive at her next appointment.  BSW to perform a case closure as no other needs have been identified. BSW provided [name] with this BSW's contact number and explained she could contact [employer] in the future if needed. [name] stated understanding and agrees with today's closure.  Daneen Schick, BSW, CDP [facility] [phone]

## 2018-06-23 ENCOUNTER — Ambulatory Visit: Payer: Medicare Other | Admitting: Family Medicine

## 2018-06-23 ENCOUNTER — Encounter

## 2018-06-30 ENCOUNTER — Ambulatory Visit: Payer: Self-pay | Admitting: Family Medicine

## 2018-07-03 ENCOUNTER — Ambulatory Visit: Payer: Medicare Other | Admitting: Family Medicine

## 2018-07-16 ENCOUNTER — Telehealth: Payer: Self-pay | Admitting: Family Medicine

## 2018-07-16 NOTE — Telephone Encounter (Signed)
Called and spoke with pt's roommate [name]- per [facility]) and let her know of Dr. Brigitte Pulse being on leave and that pt's appt would have to be cancelled. [name] acknowledged and stated that she would have pt call in to reschedule due to her being sick and in bed today.

## 2018-07-20 DIAGNOSIS — K660 Peritoneal adhesions (postprocedural) (postinfection): Secondary | ICD-10-CM | POA: Diagnosis not present

## 2018-07-20 DIAGNOSIS — Z79899 Other long term (current) drug therapy: Secondary | ICD-10-CM | POA: Diagnosis not present

## 2018-07-20 DIAGNOSIS — Z87891 Personal history of nicotine dependence: Secondary | ICD-10-CM | POA: Diagnosis not present

## 2018-07-21 DIAGNOSIS — J449 Chronic obstructive pulmonary disease, unspecified: Secondary | ICD-10-CM | POA: Diagnosis not present

## 2018-07-22 ENCOUNTER — Ambulatory Visit: Payer: Self-pay | Admitting: Family Medicine

## 2018-07-24 DIAGNOSIS — Z87891 Personal history of nicotine dependence: Secondary | ICD-10-CM | POA: Diagnosis not present

## 2018-07-24 DIAGNOSIS — G4700 Insomnia, unspecified: Secondary | ICD-10-CM | POA: Diagnosis not present

## 2018-07-24 DIAGNOSIS — R110 Nausea: Secondary | ICD-10-CM | POA: Diagnosis not present

## 2018-07-26 DIAGNOSIS — R82998 Other abnormal findings in urine: Secondary | ICD-10-CM | POA: Diagnosis not present

## 2018-07-26 DIAGNOSIS — K660 Peritoneal adhesions (postprocedural) (postinfection): Secondary | ICD-10-CM | POA: Diagnosis not present

## 2018-07-30 ENCOUNTER — Emergency Department (HOSPITAL_COMMUNITY): Payer: Medicare Other

## 2018-07-30 ENCOUNTER — Other Ambulatory Visit: Payer: Self-pay

## 2018-07-30 ENCOUNTER — Emergency Department (HOSPITAL_COMMUNITY)
Admission: EM | Admit: 2018-07-30 | Discharge: 2018-07-30 | Disposition: A | Discharge status: Home / Self Care | Payer: Medicare Other | Attending: Emergency Medicine | Admitting: Emergency Medicine

## 2018-07-30 ENCOUNTER — Encounter (HOSPITAL_COMMUNITY): Payer: Self-pay

## 2018-07-30 DIAGNOSIS — F17210 Nicotine dependence, cigarettes, uncomplicated: Secondary | ICD-10-CM | POA: Insufficient documentation

## 2018-07-30 DIAGNOSIS — R531 Weakness: Secondary | ICD-10-CM | POA: Diagnosis not present

## 2018-07-30 DIAGNOSIS — J441 Chronic obstructive pulmonary disease with (acute) exacerbation: Secondary | ICD-10-CM | POA: Diagnosis not present

## 2018-07-30 DIAGNOSIS — Z79899 Other long term (current) drug therapy: Secondary | ICD-10-CM | POA: Insufficient documentation

## 2018-07-30 DIAGNOSIS — R0602 Shortness of breath: Secondary | ICD-10-CM | POA: Diagnosis not present

## 2018-07-30 DIAGNOSIS — Z7982 Long term (current) use of aspirin: Secondary | ICD-10-CM | POA: Diagnosis not present

## 2018-07-30 DIAGNOSIS — R05 Cough: Secondary | ICD-10-CM | POA: Diagnosis not present

## 2018-07-30 LAB — CBC WITH DIFFERENTIAL/PLATELET
Abs Immature Granulocytes: 0.06 10*3/uL (ref 0.00–0.07)
Basophils Absolute: 0.1 10*3/uL (ref 0.0–0.1)
Basophils Relative: 1 %
Eosinophils Absolute: 0.1 10*3/uL (ref 0.0–0.5)
Eosinophils Relative: 1 %
HCT: 42.7 % (ref 36.0–46.0)
Hemoglobin: 12.8 g/dL (ref 12.0–15.0)
Immature Granulocytes: 1 %
Lymphocytes Relative: 9 %
Lymphs Abs: 0.5 10*3/uL — ABNORMAL LOW (ref 0.7–4.0)
MCH: 29.7 pg (ref 26.0–34.0)
MCHC: 30 g/dL (ref 30.0–36.0)
MCV: 99.1 fL (ref 80.0–100.0)
Monocytes Absolute: 0.5 10*3/uL (ref 0.1–1.0)
Monocytes Relative: 9 %
Neutro Abs: 4.4 10*3/uL (ref 1.7–7.7)
Neutrophils Relative %: 79 %
Platelets: 254 10*3/uL (ref 150–400)
RBC: 4.31 MIL/uL (ref 3.87–5.11)
RDW: 14.6 % (ref 11.5–15.5)
WBC: 5.6 10*3/uL (ref 4.0–10.5)
nRBC: 0 % (ref 0.0–0.2)

## 2018-07-30 LAB — BASIC METABOLIC PANEL
Anion gap: 12 (ref 5–15)
BUN: 18 mg/dL (ref 8–23)
CO2: 27 mmol/L (ref 22–32)
Calcium: 8.6 mg/dL — ABNORMAL LOW (ref 8.9–10.3)
Chloride: 103 mmol/L (ref 98–111)
Creatinine, Ser: 0.95 mg/dL (ref 0.44–1.00)
GFR calc Af Amer: >60 mL/min (ref >60)
Glucose, Bld: 98 mg/dL (ref 70–99)
Potassium: 4 mmol/L (ref 3.5–5.1)
Sodium: 142 mmol/L (ref 135–145)

## 2018-07-30 LAB — I-STAT TROPONIN, ED: TROPONIN I, POC: 0.01 ng/mL (ref 0.00–0.08)

## 2018-07-30 MED ORDER — AZITHROMYCIN 500 MG PO TABS: 500.0000 mg | ORAL_TABLET | Freq: Every day | ORAL | 0 refills | Status: AC

## 2018-07-30 MED ORDER — PREDNISONE 10 MG PO TABS: 40.0000 mg | ORAL_TABLET | Freq: Every day | ORAL | 0 refills | Status: AC

## 2018-07-30 MED ORDER — AEROCHAMBER PLUS FLO-VU MEDIUM MISC
1.0000 | Freq: Once | Status: AC
  Administered 2018-07-30: 1
  Filled 2018-07-30: qty 1

## 2018-07-30 MED ORDER — IPRATROPIUM-ALBUTEROL 0.5-2.5 (3) MG/3ML IN SOLN
3.0000 mL | Freq: Once | RESPIRATORY_TRACT | Status: AC
  Administered 2018-07-30: 3 mL via RESPIRATORY_TRACT
  Filled 2018-07-30: qty 3

## 2018-07-30 MED ORDER — ALBUTEROL SULFATE (2.5 MG/3ML) 0.083% IN NEBU: 2.5000 mg | INHALATION_SOLUTION | Freq: Four times a day (QID) | RESPIRATORY_TRACT | 12 refills | Status: DC | PRN

## 2018-07-30 MED ORDER — ALBUTEROL SULFATE HFA 108 (90 BASE) MCG/ACT IN AERS
2.0000 | INHALATION_SPRAY | Freq: Once | RESPIRATORY_TRACT | Status: AC
  Administered 2018-07-30: 2 via RESPIRATORY_TRACT
  Filled 2018-07-30: qty 6.7

## 2018-07-30 MED ORDER — SODIUM CHLORIDE 0.9 % IV BOLUS
250.0000 mL | Freq: Once | INTRAVENOUS | Status: AC
  Administered 2018-07-30: 250 mL via INTRAVENOUS

## 2018-07-30 NOTE — ED Notes (Signed)
Patient transported to X-ray 

## 2018-07-30 NOTE — ED Provider Notes (Signed)
[facility] DEPT Provider Note   CSN: [id number] Arrival date & time: 07/30/18  0827     History   Chief Complaint Chief Complaint  Patient presents with  . Shortness of Breath  . Weakness    HPI [name] is a 68 y.o. female with history of COPD on home O2, nasal cannula 2 L, presenting today for worsening of her COPD symptoms.  Patient reports that for the past 3 days she has had increased fatigue and has noticed wheezing.  Patient has been using her home O2 without improvement.  Patient denies fever, chest pain, abdominal pain, nausea/vomiting, diarrhea or any additional concerns.  She denies focal weakness.  Patient states that she feels as if she is having worsening of her COPD symptoms and has lost her home nebulizer.  She is requesting breathing treatment here emergency department today.  She denies extremity swelling/pain, recent surgery/immobilization, hemoptysis or chest pain.   Shortness of Breath  Severity:  Moderate Onset quality:  Gradual Duration:  3 days Timing:  Constant Progression:  Worsening Chronicity:  Chronic Relieved by:  Nothing Worsened by:  Nothing Ineffective treatments: Home O2. Associated symptoms: wheezing   Associated symptoms: no abdominal pain, no chest pain, no cough, no fever, no headaches, no hemoptysis, no neck pain, no sore throat, no syncope and no vomiting   Risk factors comment:  COPD Weakness  Associated symptoms: shortness of breath   Associated symptoms: no abdominal pain, no chest pain, no cough, no dizziness, no dysuria, no fever, no headaches, no syncope and no vomiting    Past Medical History:  Diagnosis Date  . Acute duodenal ulcer with hemorrhage and perforation, with obstruction ([place]) 06/26/2004   multiple pyloric perferations  . Allergy   . Anemia   . Anxiety    sees [name] NP at Dr. Radonna Ricker office  . Asthma    hx of   . Barrett's esophagus   . Chronic abdominal pain    narcotic dependence, dr gyarteng-dak at [facility]  . 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 Complication of anesthesia    pt states had too much xanax on board - agitated   . COPD (chronic obstructive pulmonary disease) ([place])   . Depression   . Exertional shortness of breath   . Fall 11/23/2014   frequent falls  . Fx of fibula 07-28-11   left fibula, 2 places   . Fx two ribs-open 08-24-11   left 4th and 5th  . GASTRIC OUTLET OBSTRUCTION 08/28/2007   In July 2008 she underwent laparoscopic enterolysis, Nissen fundoplication over a #69 bougie, single pledgeted suture colosure of the hiatus and a 3 suture wrap.  Lap truncal vagotomy and a loop gastrojejunostomy was performed.  This was revised in August of 2009 to a roux en Y gastrojejujostomy.     . Gastroparesis 06/03/2007   Qualifier: Diagnosis of  By: [name] MD, [name]   . GERD (gastroesophageal reflux disease)   . History of blood transfusion    "related to OR; maybe when I perforated that ulcer"  . History of hiatal hernia   . Lap Nissen + truncal vagotomy July 2008 05/13/2013  . LEG EDEMA 01/19/2008   Qualifier: Diagnosis of  By: [name] MD, [name]   . Macular degeneration, bilateral    early onset, severe, legally blind and worsening. Followed by dr. Katy Fitch  . MENOPAUSE 01/07/2007   Qualifier: Diagnosis of  By: [name], CMA, [place]    .  Narcotic abuse ([place])   . Osteoporosis    severe  . Oxygen deficiency   . Oxygen dependent    3L/   . Peptic ulcer disease    dr Oletta Lamas  . Pneumonia, organism unspecified(486) 11/07/2010   Assoc with R Parapneumonic effusion 09/2010    - Tapped 10/05/10    - CxR resolved 11/07/2010    . S/P jejunostomy Feb 2016 07/26/2014  . Thyroid nodule 2013   benign after biopsy w/ Dr. Redmond Baseman    Patient Active Problem List   Diagnosis Date Noted  . Abdominal pain, chronic, epigastric 05/31/2017  . Episodic polysubstance dependence ([place]) 05/31/2017  . At high risk for injury  related to fall 05/18/2017  . Chronic nausea 05/18/2017  . Poor compliance with medication 11/20/2016  . Insomnia 11/20/2016  . Early onset macular degeneration 11/20/2016  . Legally blind 11/20/2016  . Abdominal pain   . Hypoxia 04/08/2015  . Opioid type dependence, continuous ([place])   . Protein-calorie malnutrition ([place])   . Episode of recurrent major depressive disorder ([place])   . Secondary hyperparathyroidism ([place]) 01/10/2015  . Vitamin D deficiency 01/10/2015  . 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 Anemia, iron deficiency 11/03/2014  . Chronic pain syndrome 10/24/2014  . Osteoporosis 10/05/2014  . Vertebral compression fracture ([place]) 10/05/2014  . Coronary artery disease due to calcified coronary lesion 10/05/2014  . Weight loss   . Tobacco abuse   . History of subtotal gastrectomy with Roux-en-Y recontruction   . Orthostatic hypotension 08/11/2014  . Memory loss, short term 07/18/2014  . Constipation, chronic 07/10/2013  . Chronic abdominal pain   . COPD (chronic obstructive pulmonary disease) ([place]) 11/07/2010  . Major depressive disorder, recurrent episode, moderate ([place]) 06/06/2010  . 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 Anxiety state 01/07/2007  . Barrett's esophagus 07/03/2005    Past Surgical History:  Procedure Laterality Date  . BIOPSY THYROID  08-13-11   benign nodule, per Dr. Melida Quitter   . CATARACT EXTRACTION W/ INTRAOCULAR LENS  IMPLANT, BILATERAL Bilateral   . CHOLECYSTECTOMY N/A 12/02/2017   Procedure: LAPAROSCOPIC CHOLECYSTECTOMY WITH INTRAOPERATIVE CHOLANGIOGRAM ERAS PATHWAY;  Surgeon: Johnathan Hausen, MD;  Location: [facility];  Service: General;  Laterality: N/A;  . COLONOSCOPY    . DILATION AND CURETTAGE OF UTERUS    . ESOPHAGOGASTRODUODENOSCOPY  12-29-09   dr qadeer at [facility], several gastric ulcers  . ESOPHAGOGASTRODUODENOSCOPY N/A 09/25/2012   Procedure: ESOPHAGOGASTRODUODENOSCOPY  (EGD);  Surgeon: Beryle Beams, MD;  Location: [facility];  Service: Endoscopy;  Laterality: N/A;  . ESOPHAGOGASTRODUODENOSCOPY (EGD) WITH PROPOFOL N/A 06/27/2017   Procedure: ESOPHAGOGASTRODUODENOSCOPY (EGD) WITH PROPOFOL;  Surgeon: Carol Ada, MD;  Location: [facility];  Service: Endoscopy;  Laterality: N/A;  . FRACTURE SURGERY Left 2010   hip and leg s/p fall  . GASTROJEJUNOSTOMY  02/20/2008   Roux en Y gastrojejunsotomy w 1 foot Roux limb, laparotomy, takedown loop gastrojejunostomy with removal of Ntinol stent  . GASTROSTOMY N/A 07/26/2014   Procedure: LAPRASCOPIC ASSISTED OPEN PLACEMENT OF JEJUNOSTOMY TUBE;  Surgeon: Pedro Earls, MD;  Location: [facility];  Service: General;  Laterality: N/A;  . HERNIA REPAIR     "hiatal"  . JOINT REPLACEMENT    . LAPAROSCOPIC NISSEN FUNDOPLICATION  [id number]  . LAPAROSCOPIC PARTIAL GASTRECTOMY N/A 06/29/2013   Procedure: Gastrectomy and GJ Tube placement;  Surgeon: Pedro Earls, MD;  Location: [facility];  Service: General;  Laterality: N/A;  . ORIF FEMORAL NECK  FRACTURE W/ DHS Right 03/2014  . ORIF HIP FRACTURE Left 2010  . REPAIR OF PERFORATED ULCER    . TONSILLECTOMY  1974  . TRUNCAL VAGOTOMY  12/2006   Laparoscopic enterolysis, laparoscopic Nissen's Fundoplication, laparoscopic truncal vagotomy & loop gastrojejunostomy     OB History   No obstetric history on file.      Home Medications    Prior to Admission medications   Medication Sig Start Date End Date Taking? Authorizing Provider  albuterol (PROVENTIL HFA;VENTOLIN HFA) 108 (90 Base) MCG/ACT inhaler Inhale 2 puffs into the lungs every 4 (four) hours as needed (cough, shortness of breath or wheezing.). 08/01/15   Shawnee Knapp, MD  albuterol (PROVENTIL) (2.5 MG/3ML) 0.083% nebulizer solution Take 3 mLs (2.5 mg total) by nebulization every 6 (six) hours as needed for wheezing or shortness of breath. 07/30/18   Deliah Boston, PA-C  Aspirin-Acetaminophen (GOODYS BODY PAIN PO) Take 1-2  packets by mouth every 8 (eight) hours as needed (pain).     [provider]  azithromycin (ZITHROMAX) 500 MG tablet Take 1 tablet (500 mg total) by mouth daily for 3 days. Take first 2 tablets together, then 1 every day until finished. 07/30/18 08/02/18  Nuala Alpha A, PA-C  cefdinir (OMNICEF) 300 MG capsule  05/04/18   [provider]  CHANTIX STARTING MONTH PAK 0.5 MG X 11 & 1 MG X 42 tablet  05/04/18   [provider]  DULoxetine (CYMBALTA) 30 MG capsule Take 90 mg by mouth daily.     [provider]  DULoxetine (CYMBALTA) 60 MG capsule  04/28/18   [provider]  gabapentin (NEURONTIN) 300 MG capsule  04/29/18   [provider]  HYDROcodone-acetaminophen (NORCO) 7.5-325 MG tablet Take 1 tablet by mouth every 6 (six) hours. 06/25/18   [provider]  HYDROcodone-acetaminophen (NORCO/VICODIN) 5-325 MG tablet Take 1 tablet by mouth every 6 (six) hours as needed for moderate pain.  12/12/17   [provider]  ondansetron (ZOFRAN-ODT) 4 MG disintegrating tablet Take 4 mg by mouth 2 (two) times daily as needed for nausea/vomiting. 07/20/18   [provider]  ondansetron (ZOFRAN-ODT) 8 MG disintegrating tablet Take 1 tablet (8 mg total) by mouth every 8 (eight) hours as needed for nausea. 05/22/18   Rutherford Guys, MD  oxyCODONE (OXY IR/ROXICODONE) 5 MG immediate release tablet Take 1 tablet (5 mg total) by mouth every 6 (six) hours as needed for severe pain. 12/02/17   Johnathan Hausen, MD  predniSONE (DELTASONE) 10 MG tablet Take 4 tablets (40 mg total) by mouth daily for 5 days. 07/30/18 08/04/18  Nuala Alpha A, PA-C  Vitamin D, Ergocalciferol, (DRISDOL) 1.25 MG (50000 UT) CAPS capsule Take 1 capsule (50,000 Units total) by mouth every 7 (seven) days. 06/01/18   Rutherford Guys, MD  zolpidem (AMBIEN) 5 MG tablet Take 5 mg by mouth at bedtime. 07/20/18   [provider]    Family History Family History  Problem  Relation Age of Onset  . Cancer Mother        kidney/bladder cancer, magliant skin cancer on face, cervical cancer  . Stroke Mother   . Hypertension Mother   . Celiac disease Father   . Cancer Father        metastatic renal cell carcinoma, pancreatic cancer, colon polyps  . Colon cancer Maternal Uncle   . Breast cancer Neg Hx   . Ovarian cancer Neg Hx   . Endometrial cancer Neg Hx  Social History Social History   Tobacco Use  . Smoking status: Current Every Day Smoker    Packs/day: 1.00    Years: 51.00    Pack years: 51.00    Types: Cigarettes  . Smokeless tobacco: Never Used  Substance Use Topics  . Alcohol use: No    Alcohol/week: 0.0 standard drinks  . Drug use: No     Allergies   Lithium; Celebrex [celecoxib]; Morphine; Quetiapine; and Varenicline tartrate   Review of Systems Review of Systems  Constitutional: Positive for fatigue. Negative for chills and fever.  HENT: Negative for sore throat.   Respiratory: Positive for shortness of breath and wheezing. Negative for cough and hemoptysis.   Cardiovascular: Negative for chest pain, leg swelling and syncope.  Gastrointestinal: Negative for abdominal pain and vomiting.  Genitourinary: Negative.  Negative for dysuria and hematuria.  Musculoskeletal: Negative for back pain and neck pain.  Neurological: Negative for dizziness, syncope, numbness and headaches.  All other systems reviewed and are negative.  Physical Exam Updated Vital Signs BP 119/60   Pulse 86   Temp 98.4 F (36.9 C) (Oral)   Resp (!) 24   Ht 5\' 4"  (1.626 m)   Wt 39.9 kg   SpO2 93%   BMI 15.11 kg/m   Physical Exam Constitutional:      General: She is not in acute distress.    Appearance: She is not ill-appearing or diaphoretic.     Comments: Frail appearing  HENT:     Head: Normocephalic and atraumatic.     Mouth/Throat:     Mouth: Mucous membranes are moist.     Pharynx: Oropharynx is clear.  Eyes:     General: Vision grossly  intact. Gaze aligned appropriately.     Extraocular Movements: Extraocular movements intact.     Pupils: Pupils are equal, round, and reactive to light.  Neck:     Musculoskeletal: Full passive range of motion without pain, normal range of motion and neck supple.     Vascular: No JVD.     Trachea: Trachea and phonation normal. No tracheal deviation.  Cardiovascular:     Rate and Rhythm: Normal rate and regular rhythm.     Pulses:          Dorsalis pedis pulses are 2+ on the right side and 2+ on the left side.       Posterior tibial pulses are 2+ on the right side and 2+ on the left side.     Heart sounds: Normal heart sounds.  Pulmonary:     Effort: Pulmonary effort is normal. No accessory muscle usage or respiratory distress.     Breath sounds: Wheezing (Moderate expiratory diffuse wheezing) present. No rhonchi.  Chest:     Chest wall: No deformity or tenderness.  Abdominal:     General: Bowel sounds are normal.     Palpations: Abdomen is soft.     Tenderness: There is no abdominal tenderness.  Musculoskeletal: Normal range of motion.     Right lower leg: She exhibits no tenderness. No edema.     Left lower leg: She exhibits no tenderness. No edema.  Skin:    General: Skin is warm and dry.     Capillary Refill: Capillary refill takes less than 2 seconds.  Neurological:     General: No focal deficit present.     Mental Status: She is alert.     GCS: GCS eye subscore is 4. GCS verbal subscore is 5. GCS motor subscore  is 6.     Comments: Speech is clear and goal oriented, follows commands Major Cranial nerves without deficit, no facial droop Normal strength in upper and lower extremities bilaterally including dorsiflexion and plantar flexion, strong and equal grip strength Sensation normal to light touch Moves extremities without ataxia, coordination intact  Psychiatric:        Mood and Affect: Mood normal.        Behavior: Behavior normal.    ED Treatments / Results   Labs (all labs ordered are listed, but only abnormal results are displayed) Labs Reviewed  BASIC METABOLIC PANEL - Abnormal; Notable for the following components:      Result Value   Calcium 8.6 (*)    All other components within normal limits  CBC WITH DIFFERENTIAL/PLATELET - Abnormal; Notable for the following components:   Lymphs Abs 0.5 (*)    All other components within normal limits  I-STAT TROPONIN, ED    EKG EKG Interpretation  Date/Time:  Thursday July 30 2018 09:30:07 EST Ventricular Rate:  97 PR Interval:    QRS Duration: 78 QT Interval:  310 QTC Calculation: 394 R Axis:   85 Text Interpretation:  Sinus rhythm Borderline right axis deviation Confirmed by Virgel Manifold [phone]) on 07/30/2018 10:42:41 AM   Radiology Dg Chest 2 View  Result Date: 07/30/2018 CLINICAL DATA:  Hx. asthma and COPD, c/o SOB cough chest tightness, post breathing tx. EXAM: CHEST - 2 VIEW COMPARISON:  04/23/2018 FINDINGS: Cardiac silhouette is normal in size and configuration. No mediastinal or hilar masses. There is no evidence of adenopathy. Lungs are hyperexpanded, with mild scarring in the anterior lower lungs best seen on the lateral view. Lungs otherwise clear with no evidence of pneumonia or pulmonary edema. No pleural effusion or pneumothorax. Skeletal structures are demineralized but intact. IMPRESSION: 1. No acute cardiopulmonary disease. 2. COPD. Electronically Signed   By: Lajean Manes M.D.   On: 07/30/2018 10:00    Procedures Procedures (including critical care time)  Medications Ordered in ED Medications  albuterol (PROVENTIL HFA;VENTOLIN HFA) 108 (90 Base) MCG/ACT inhaler 2 puff (has no administration in time range)  AEROCHAMBER PLUS FLO-VU MEDIUM MISC 1 each (has no administration in time range)  ipratropium-albuterol (DUONEB) 0.5-2.5 (3) MG/3ML nebulizer solution 3 mL (3 mLs Nebulization Given 07/30/18 0919)  sodium chloride 0.9 % bolus 250 mL (250 mLs Intravenous New  Bag/Given 07/30/18 0918)     Initial Impression / Assessment and Plan / ED Course  I have reviewed the triage vital signs and the nursing notes.  Pertinent labs & imaging results that were available during my care of the patient were reviewed by me and considered in my medical decision making (see chart for details).    68 year old female presenting for 3-day history of fatigue and shortness of breath that she states feels like her normal COPD exacerbation.  She has been using home O2 however does not have a nebulizer to use at home.  On arrival patient well-appearing and in no acute distress, she has bilateral moderate expiratory wheezing.  CXR: IMPRESSION: 1. No acute cardiopulmonary disease. 2. COPD. Troponin negative BMP nonacute CBC nonacute EKG without acute changes reviewed by Dr. Wilson Singer  Patient given small fluid bolus Patient given DuoNeb, on reassessment patient states that she is feeling much improved and is requesting discharge.  Lungs clear to auscultation bilaterally on reassessment.  Patient's SPO2 mid 90s on room air with heart rate around 80 bpm.  Patient now reports breathing at baseline. ----------------------------  At discharge patient afebrile, not tachycardic, not hypotensive, not hypoxic on room air.  She does use 2 L via nasal cannula at home which she has been doing well without while here in the emergency department.  Suspect patient symptoms today due to her COPD exacerbation, do not suspect acute cardiopulmonary etiology of symptoms today.  Plan to treat patient's COPD exacerbation with refill patient's nebulizer, give rescue inhaler here, start patient on short prednisone course and prescribe azithromycin.  At this time there does not appear to be any evidence of an acute emergency medical condition and the patient appears stable for discharge with appropriate outpatient follow up. Diagnosis was discussed with patient who verbalizes understanding of care plan and  is agreeable to discharge. I have discussed return precautions with patient and family at bedside who verbalize understanding of return precautions. Patient strongly encouraged to follow-up with their PCP within 5 days. All questions answered.  Case discussed with and patient seen and evaluated by Dr. Wilson Singer who agrees with discharge at this time with Azithro, Prednisone and albuterol with PCP follow-up.  Note: Portions of this report may have been transcribed using voice recognition software. Every effort was made to ensure accuracy; however, inadvertent computerized transcription errors may still be present. Final Clinical Impressions(s) / ED Diagnoses   Final diagnoses:  COPD exacerbation [facility])    ED Discharge Orders         Ordered    albuterol (PROVENTIL) (2.5 MG/3ML) 0.083% nebulizer solution  Every 6 hours PRN     07/30/18 1048    azithromycin (ZITHROMAX) 500 MG tablet  Daily     07/30/18 1048    predniSONE (DELTASONE) 10 MG tablet  Daily     07/30/18 1050           Gari Crown 07/30/18 1123    Virgel Manifold, MD 07/30/18 1325

## 2018-07-30 NOTE — Discharge Instructions (Addendum)
You have been diagnosed today with COPD exacerbation.   At this time there does not appear to be the presence of an emergent medical condition, however there is always the potential for conditions to change. Please read and follow the below instructions.  Please return to the Emergency Department immediately for any new or worsening symptoms. Please be sure to follow up with your Primary Care Provider within 5 days regarding your visit today; please call their office to schedule an appointment even if you are feeling better for a follow-up visit. Please take the steroid medication prednisone as prescribed for the next 5 days. Please take the antibiotic azithromycin as prescribed for the next 3 days. You may use the albuterol rescue inhaler given to you today as needed for your symptoms.  Please return to the emergency department immediately if you feel that this medication is not working or you are using it too often. You have been provided with refills for your home nebulizer, you may use these as prescribed.  Get help right away if: You have shortness of breath while you are resting. You have shortness of breath that prevents you from: Being able to talk. Performing your usual physical activities. You have chest pain lasting longer than 5 minutes. Your skin color is more blue (cyanotic) than usual. You measure low oxygen saturations for longer than 5 minutes with a pulse oximeter. You have a fever. You feel too tired to breathe normally.  Please read the additional information packets attached to your discharge summary.  Do not take your medicine if  develop an itchy rash, swelling in your mouth or lips, or difficulty breathing.

## 2018-08-05 ENCOUNTER — Emergency Department (HOSPITAL_COMMUNITY): Payer: Medicare Other

## 2018-08-05 ENCOUNTER — Ambulatory Visit: Payer: Medicare Other | Admitting: Family Medicine

## 2018-08-05 ENCOUNTER — Encounter (HOSPITAL_COMMUNITY): Payer: Self-pay

## 2018-08-05 ENCOUNTER — Other Ambulatory Visit: Payer: Self-pay

## 2018-08-05 ENCOUNTER — Emergency Department (HOSPITAL_COMMUNITY)
Admission: EM | Admit: 2018-08-05 | Discharge: 2018-08-05 | Disposition: A | Discharge status: Home / Self Care | Payer: Medicare Other | Attending: Emergency Medicine | Admitting: Emergency Medicine

## 2018-08-05 DIAGNOSIS — J449 Chronic obstructive pulmonary disease, unspecified: Secondary | ICD-10-CM | POA: Diagnosis not present

## 2018-08-05 DIAGNOSIS — F419 Anxiety disorder, unspecified: Secondary | ICD-10-CM | POA: Diagnosis not present

## 2018-08-05 DIAGNOSIS — Z9049 Acquired absence of other specified parts of digestive tract: Secondary | ICD-10-CM | POA: Insufficient documentation

## 2018-08-05 DIAGNOSIS — J45909 Unspecified asthma, uncomplicated: Secondary | ICD-10-CM | POA: Diagnosis not present

## 2018-08-05 DIAGNOSIS — R05 Cough: Secondary | ICD-10-CM | POA: Insufficient documentation

## 2018-08-05 DIAGNOSIS — Z79899 Other long term (current) drug therapy: Secondary | ICD-10-CM | POA: Diagnosis not present

## 2018-08-05 DIAGNOSIS — I2510 Atherosclerotic heart disease of native coronary artery without angina pectoris: Secondary | ICD-10-CM | POA: Insufficient documentation

## 2018-08-05 DIAGNOSIS — F17210 Nicotine dependence, cigarettes, uncomplicated: Secondary | ICD-10-CM | POA: Diagnosis not present

## 2018-08-05 DIAGNOSIS — F1120 Opioid dependence, uncomplicated: Secondary | ICD-10-CM | POA: Insufficient documentation

## 2018-08-05 DIAGNOSIS — R5383 Other fatigue: Secondary | ICD-10-CM | POA: Diagnosis not present

## 2018-08-05 DIAGNOSIS — R531 Weakness: Secondary | ICD-10-CM | POA: Diagnosis not present

## 2018-08-05 DIAGNOSIS — R059 Cough, unspecified: Secondary | ICD-10-CM

## 2018-08-05 LAB — URINALYSIS, ROUTINE W REFLEX MICROSCOPIC
BILIRUBIN URINE: NEGATIVE
Glucose, UA: NEGATIVE mg/dL
Hgb urine dipstick: NEGATIVE
Ketones, ur: 20 mg/dL — AB
Leukocytes,Ua: NEGATIVE
Nitrite: NEGATIVE
Protein, ur: NEGATIVE mg/dL
SPECIFIC GRAVITY, URINE: 1.019 (ref 1.005–1.030)
pH: 5 (ref 5.0–8.0)

## 2018-08-05 LAB — CBC WITH DIFFERENTIAL/PLATELET
Abs Immature Granulocytes: 0.02 10*3/uL (ref 0.00–0.07)
Basophils Absolute: 0 10*3/uL (ref 0.0–0.1)
Basophils Relative: 0 %
Eosinophils Absolute: 0 10*3/uL (ref 0.0–0.5)
Eosinophils Relative: 1 %
HCT: 38.1 % (ref 36.0–46.0)
Hemoglobin: 12 g/dL (ref 12.0–15.0)
Immature Granulocytes: 1 %
Lymphocytes Relative: 21 %
Lymphs Abs: 0.9 10*3/uL (ref 0.7–4.0)
MCH: 29.5 pg (ref 26.0–34.0)
MCHC: 31.5 g/dL (ref 30.0–36.0)
MCV: 93.6 fL (ref 80.0–100.0)
Monocytes Absolute: 0.3 10*3/uL (ref 0.1–1.0)
Monocytes Relative: 7 %
Neutro Abs: 3.1 10*3/uL (ref 1.7–7.7)
Neutrophils Relative %: 70 %
PLATELETS: 219 10*3/uL (ref 150–400)
RBC: 4.07 MIL/uL (ref 3.87–5.11)
RDW: 14.2 % (ref 11.5–15.5)
WBC: 4.4 10*3/uL (ref 4.0–10.5)
nRBC: 0 % (ref 0.0–0.2)

## 2018-08-05 LAB — COMPREHENSIVE METABOLIC PANEL
ALT: 15 U/L (ref 0–44)
AST: 28 U/L (ref 15–41)
Albumin: 3.5 g/dL (ref 3.5–5.0)
Alkaline Phosphatase: 88 U/L (ref 38–126)
Anion gap: 9 (ref 5–15)
BUN: 7 mg/dL — ABNORMAL LOW (ref 8–23)
CO2: 31 mmol/L (ref 22–32)
Calcium: 8.1 mg/dL — ABNORMAL LOW (ref 8.9–10.3)
Chloride: 94 mmol/L — ABNORMAL LOW (ref 98–111)
Creatinine, Ser: 0.67 mg/dL (ref 0.44–1.00)
GFR calc Af Amer: >60 mL/min (ref >60)
GFR calc non Af Amer: >60 mL/min (ref >60)
Glucose, Bld: 93 mg/dL (ref 70–99)
Potassium: 3.5 mmol/L (ref 3.5–5.1)
Sodium: 134 mmol/L — ABNORMAL LOW (ref 135–145)
Total Bilirubin: 0.4 mg/dL (ref 0.3–1.2)
Total Protein: 6.4 g/dL — ABNORMAL LOW (ref 6.5–8.1)

## 2018-08-05 MED ORDER — IPRATROPIUM-ALBUTEROL 0.5-2.5 (3) MG/3ML IN SOLN
3.0000 mL | Freq: Once | RESPIRATORY_TRACT | Status: AC
  Administered 2018-08-05: 3 mL via RESPIRATORY_TRACT
  Filled 2018-08-05: qty 3

## 2018-08-05 MED ORDER — ALBUTEROL SULFATE (5 MG/ML) 0.5% IN NEBU: 5.0000 mg | INHALATION_SOLUTION | Freq: Four times a day (QID) | RESPIRATORY_TRACT | 0 refills | Status: AC | PRN

## 2018-08-05 MED ORDER — ALBUTEROL SULFATE HFA 108 (90 BASE) MCG/ACT IN AERS: 2.0000 | INHALATION_SPRAY | RESPIRATORY_TRACT | Status: DC | PRN

## 2018-08-05 MED ORDER — PREDNISONE 20 MG PO TABS: 40.0000 mg | ORAL_TABLET | Freq: Every day | ORAL | 0 refills | Status: DC

## 2018-08-05 MED ORDER — SODIUM CHLORIDE 0.9 % IV BOLUS
1000.0000 mL | Freq: Once | INTRAVENOUS | Status: AC
  Administered 2018-08-05: 1000 mL via INTRAVENOUS

## 2018-08-05 NOTE — Discharge Instructions (Signed)
Please read and follow all provided instructions.  Your diagnoses today include:  1. Fatigue, unspecified type   2. Cough     Tests performed today include:  Chest x-ray - did not show pneumonia  Blood counts and electrolytes  Vital signs. See below for your results today.   Medications prescribed:   Albuterol inhaler - medication that opens up your airway  Use inhaler as follows: 1-2 puffs with spacer every 4 hours as needed for wheezing, cough, or shortness of breath.    Prednisone - steroid medicine   It is best to take this medication in the morning to prevent sleeping problems. If you are diabetic, monitor your blood sugar closely and stop taking Prednisone if blood sugar is over 300. Take with food to prevent stomach upset.   Take any prescribed medications only as directed.  Home care instructions:  Follow any educational materials contained in this packet.  Follow-up instructions: Please follow-up with your primary care provider in the next 3 days for further evaluation of your symptoms and management of your COPD.   Return instructions:   Please return to the Emergency Department if you experience worsening symptoms.  Please return with worsening wheezing, shortness of breath, or difficulty breathing.  Return with persistent fever above 101F.   Please return if you have any other emergent concerns.  Additional Information:  Your vital signs today were: BP (!) 150/76    Pulse 71    Temp 98.7 F (37.1 C) (Oral)    Resp 19    SpO2 96%  If your blood pressure (BP) was elevated above 135/85 this visit, please have this repeated by your doctor within one month. --------------

## 2018-08-05 NOTE — ED Notes (Signed)
Patient aware we need urine, understands to let us know when she is able to go so we are able to assist her

## 2018-08-05 NOTE — ED Triage Notes (Signed)
Per EMS pt coming from home with a c/o generalized weakness x 3 days; per EMS pt reports she has a dr's appt today but sts she doesn't want to go, pt also didn't want to come to ER but her roommate insisted because they can't take care of pt. Per EMS pt sts she just wants to sleep and has been sleeping since yesterday, she denies any pain. EMS reports pt is partially blind

## 2018-08-05 NOTE — ED Provider Notes (Signed)
[facility] DEPT Provider Note   CSN: [id number] Arrival date & time: 08/05/18  0907     History   Chief Complaint Chief Complaint  Patient presents with  . Fatigue    HPI [name] is a 68 y.o. female.  Patient with history of COPD, GI bleeding, chronic pain on narcotics, previous Roux-en-Y and fundoplication --presents the emergency department today with complaint of weakness.  Patient states that she has been lying in bed sleeping for a couple of days.  A family member called the ambulance for her this morning because she did not want to get up.  Patient currently has no focal complaints.  No fevers, URI symptoms, chest pain, shortness of breath.  No nausea, vomiting, or diarrhea.  No blood in the stool, blood in the urine or other urinary symptoms including dysuria, increased frequency or urgency.  The onset of this condition was acute. The course is constant. Aggravating factors: none. Alleviating factors: none.       Past Medical History:  Diagnosis Date  . Acute duodenal ulcer with hemorrhage and perforation, with obstruction ([place]) 06/26/2004   multiple pyloric perferations  . Allergy   . Anemia   . Anxiety    sees [name] NP at Dr. Radonna Ricker office  . Asthma    hx of   . Barrett's esophagus   . Chronic abdominal pain    narcotic dependence, dr gyarteng-dak at [facility]  . 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 Complication of anesthesia    pt states had too much xanax on board - agitated   . COPD (chronic obstructive pulmonary disease) ([place])   . Depression   . Exertional shortness of breath   . Fall 11/23/2014   frequent falls  . Fx of fibula 07-28-11   left fibula, 2 places   . Fx two ribs-open 08-24-11   left 4th and 5th  . GASTRIC OUTLET OBSTRUCTION 08/28/2007   In July 2008 she underwent laparoscopic enterolysis, Nissen fundoplication over a #12 bougie, single pledgeted suture  colosure of the hiatus and a 3 suture wrap.  Lap truncal vagotomy and a loop gastrojejunostomy was performed.  This was revised in August of 2009 to a roux en Y gastrojejujostomy.     . Gastroparesis 06/03/2007   Qualifier: Diagnosis of  By: [name] MD, [name]   . GERD (gastroesophageal reflux disease)   . History of blood transfusion    "related to OR; maybe when I perforated that ulcer"  . History of hiatal hernia   . Lap Nissen + truncal vagotomy July 2008 05/13/2013  . LEG EDEMA 01/19/2008   Qualifier: Diagnosis of  By: [name] MD, [name]   . Macular degeneration, bilateral    early onset, severe, legally blind and worsening. Followed by dr. Katy Fitch  . MENOPAUSE 01/07/2007   Qualifier: Diagnosis of  By: [name], CMA, [place]    . Narcotic abuse ([place])   . Osteoporosis    severe  . Oxygen deficiency   . Oxygen dependent    3L/   . Peptic ulcer disease    dr Oletta Lamas  . Pneumonia, organism unspecified(486) 11/07/2010   Assoc with R Parapneumonic effusion 09/2010    - Tapped 10/05/10    - CxR resolved 11/07/2010    . S/P jejunostomy Feb 2016 07/26/2014  . Thyroid nodule 2013   benign after biopsy w/ Dr. Redmond Baseman    Patient Active Problem List   Diagnosis  Date Noted  . Abdominal pain, chronic, epigastric 05/31/2017  . Episodic polysubstance dependence ([place]) 05/31/2017  . At high risk for injury related to fall 05/18/2017  . Chronic nausea 05/18/2017  . Poor compliance with medication 11/20/2016  . Insomnia 11/20/2016  . Early onset macular degeneration 11/20/2016  . Legally blind 11/20/2016  . Abdominal pain   . Hypoxia 04/08/2015  . Opioid type dependence, continuous ([place])   . Protein-calorie malnutrition ([place])   . Episode of recurrent major depressive disorder ([place])   . Secondary hyperparathyroidism ([place]) 01/10/2015  . Vitamin D deficiency 01/10/2015  . 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 Anemia, iron deficiency 11/03/2014  . Chronic pain syndrome 10/24/2014  .  Osteoporosis 10/05/2014  . Vertebral compression fracture ([place]) 10/05/2014  . Coronary artery disease due to calcified coronary lesion 10/05/2014  . Weight loss   . Tobacco abuse   . History of subtotal gastrectomy with Roux-en-Y recontruction   . Orthostatic hypotension 08/11/2014  . Memory loss, short term 07/18/2014  . Constipation, chronic 07/10/2013  . Chronic abdominal pain   . COPD (chronic obstructive pulmonary disease) ([place]) 11/07/2010  . Major depressive disorder, recurrent episode, moderate ([place]) 06/06/2010  . 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 Anxiety state 01/07/2007  . Barrett's esophagus 07/03/2005    Past Surgical History:  Procedure Laterality Date  . BIOPSY THYROID  08-13-11   benign nodule, per Dr. Melida Quitter   . CATARACT EXTRACTION W/ INTRAOCULAR LENS  IMPLANT, BILATERAL Bilateral   . CHOLECYSTECTOMY N/A 12/02/2017   Procedure: LAPAROSCOPIC CHOLECYSTECTOMY WITH INTRAOPERATIVE CHOLANGIOGRAM ERAS PATHWAY;  Surgeon: Johnathan Hausen, MD;  Location: [facility];  Service: General;  Laterality: N/A;  . COLONOSCOPY    . DILATION AND CURETTAGE OF UTERUS    . ESOPHAGOGASTRODUODENOSCOPY  12-29-09   dr qadeer at [facility], several gastric ulcers  . ESOPHAGOGASTRODUODENOSCOPY N/A 09/25/2012   Procedure: ESOPHAGOGASTRODUODENOSCOPY (EGD);  Surgeon: Beryle Beams, MD;  Location: [facility];  Service: Endoscopy;  Laterality: N/A;  . ESOPHAGOGASTRODUODENOSCOPY (EGD) WITH PROPOFOL N/A 06/27/2017   Procedure: ESOPHAGOGASTRODUODENOSCOPY (EGD) WITH PROPOFOL;  Surgeon: Carol Ada, MD;  Location: [facility];  Service: Endoscopy;  Laterality: N/A;  . FRACTURE SURGERY Left 2010   hip and leg s/p fall  . GASTROJEJUNOSTOMY  02/20/2008   Roux en Y gastrojejunsotomy w 1 foot Roux limb, laparotomy, takedown loop gastrojejunostomy with removal of Ntinol stent  . GASTROSTOMY N/A 07/26/2014   Procedure: LAPRASCOPIC ASSISTED OPEN PLACEMENT OF  JEJUNOSTOMY TUBE;  Surgeon: Pedro Earls, MD;  Location: [facility];  Service: General;  Laterality: N/A;  . HERNIA REPAIR     "hiatal"  . JOINT REPLACEMENT    . LAPAROSCOPIC NISSEN FUNDOPLICATION  1517  . LAPAROSCOPIC PARTIAL GASTRECTOMY N/A 06/29/2013   Procedure: Gastrectomy and GJ Tube placement;  Surgeon: Pedro Earls, MD;  Location: [facility];  Service: General;  Laterality: N/A;  . ORIF FEMORAL NECK FRACTURE W/ DHS Right 03/2014  . ORIF HIP FRACTURE Left 2010  . REPAIR OF PERFORATED ULCER    . TONSILLECTOMY  1974  . TRUNCAL VAGOTOMY  12/2006   Laparoscopic enterolysis, laparoscopic Nissen's Fundoplication, laparoscopic truncal vagotomy & loop gastrojejunostomy     OB History   No obstetric history on file.      Home Medications    Prior to Admission medications   Medication Sig Start Date End Date Taking? Authorizing Provider  albuterol (PROVENTIL HFA;VENTOLIN HFA) 108 (90 Base) MCG/ACT inhaler Inhale 2  puffs into the lungs every 4 (four) hours as needed (cough, shortness of breath or wheezing.). 08/01/15   Shawnee Knapp, MD  albuterol (PROVENTIL) (2.5 MG/3ML) 0.083% nebulizer solution Take 3 mLs (2.5 mg total) by nebulization every 6 (six) hours as needed for wheezing or shortness of breath. 07/30/18   Deliah Boston, PA-C  Aspirin-Acetaminophen (GOODYS BODY PAIN PO) Take 1-2 packets by mouth every 8 (eight) hours as needed (pain).     [provider]  cefdinir (OMNICEF) 300 MG capsule  05/04/18   [provider]  CHANTIX STARTING MONTH PAK 0.5 MG X 11 & 1 MG X 42 tablet  05/04/18   [provider]  DULoxetine (CYMBALTA) 30 MG capsule Take 90 mg by mouth daily.     [provider]  DULoxetine (CYMBALTA) 60 MG capsule  04/28/18   [provider]  gabapentin (NEURONTIN) 300 MG capsule  04/29/18   [provider]  HYDROcodone-acetaminophen (NORCO) 7.5-325 MG tablet Take 1 tablet by mouth every 6 (six) hours. 06/25/18   [provider]  HYDROcodone-acetaminophen (NORCO/VICODIN) 5-325 MG tablet Take 1 tablet by mouth every 6 (six) hours as needed for moderate pain.  12/12/17   [provider]  ondansetron (ZOFRAN-ODT) 4 MG disintegrating tablet Take 4 mg by mouth 2 (two) times daily as needed for nausea/vomiting. 07/20/18   [provider]  ondansetron (ZOFRAN-ODT) 8 MG disintegrating tablet Take 1 tablet (8 mg total) by mouth every 8 (eight) hours as needed for nausea. 05/22/18   Rutherford Guys, MD  oxyCODONE (OXY IR/ROXICODONE) 5 MG immediate release tablet Take 1 tablet (5 mg total) by mouth every 6 (six) hours as needed for severe pain. 12/02/17   Johnathan Hausen, MD  Vitamin D, Ergocalciferol, (DRISDOL) 1.25 MG (50000 UT) CAPS capsule Take 1 capsule (50,000 Units total) by mouth every 7 (seven) days. 06/01/18   Rutherford Guys, MD  zolpidem (AMBIEN) 5 MG tablet Take 5 mg by mouth at bedtime. 07/20/18   [provider]    Family History Family History  Problem Relation Age of Onset  . Cancer Mother        kidney/bladder cancer, magliant skin cancer on face, cervical cancer  . Stroke Mother   . Hypertension Mother   . Celiac disease Father   . Cancer Father        metastatic renal cell carcinoma, pancreatic cancer, colon polyps  . Colon cancer Maternal Uncle   . Breast cancer Neg Hx   . Ovarian cancer Neg Hx   . Endometrial cancer Neg Hx     Social History Social History   Tobacco Use  . Smoking status: Current Every Day Smoker    Packs/day: 1.00    Years: 51.00    Pack years: 51.00    Types: Cigarettes  . Smokeless tobacco: Never Used  Substance Use Topics  . Alcohol use: No    Alcohol/week: 0.0 standard drinks  . Drug use: No     Allergies   Lithium; Celebrex [celecoxib]; Morphine; Quetiapine; and Varenicline tartrate   Review of Systems Review of Systems  Constitutional: Positive for fatigue. Negative for fever.  HENT: Negative for rhinorrhea and sore  throat.   Eyes: Negative for redness.  Respiratory: Negative for cough.   Cardiovascular: Negative for chest pain.  Gastrointestinal: Negative for abdominal pain, diarrhea, nausea and vomiting.  Genitourinary: Negative for dysuria.  Musculoskeletal: Negative for myalgias.  Skin: Negative for rash.  Neurological: Negative for headaches.  Physical Exam Updated Vital Signs BP 140/83   Pulse 60   Temp 98.7 F (37.1 C) (Oral)   Resp 19   SpO2 95%   Physical Exam Vitals signs and nursing note reviewed.  Constitutional:      Appearance: She is well-developed.  HENT:     Head: Normocephalic and atraumatic.     Mouth/Throat:     Mouth: Mucous membranes are dry.     Pharynx: Oropharynx is clear.     Comments: Dry cracked lips and dry mucous membranes. Eyes:     General:        Right eye: No discharge.        Left eye: No discharge.     Conjunctiva/sclera: Conjunctivae normal.  Neck:     Musculoskeletal: Normal range of motion and neck supple.  Cardiovascular:     Rate and Rhythm: Normal rate and regular rhythm.     Heart sounds: Normal heart sounds.  Pulmonary:     Effort: Pulmonary effort is normal.     Breath sounds: Normal breath sounds.  Abdominal:     Palpations: Abdomen is soft.     Tenderness: There is no abdominal tenderness.  Skin:    General: Skin is warm and dry.  Neurological:     General: No focal deficit present.     Mental Status: She is alert and oriented to person, place, and time.     Cranial Nerves: No cranial nerve deficit.     Sensory: No sensory deficit.     Motor: No weakness.      ED Treatments / Results  Labs (all labs ordered are listed, but only abnormal results are displayed) Labs Reviewed  COMPREHENSIVE METABOLIC PANEL - Abnormal; Notable for the following components:      Result Value   Sodium 134 (*)    Chloride 94 (*)    BUN 7 (*)    Calcium 8.1 (*)    Total Protein 6.4 (*)    All other components within normal limits    URINALYSIS, ROUTINE W REFLEX MICROSCOPIC - Abnormal; Notable for the following components:   Ketones, ur 20 (*)    All other components within normal limits  CBC WITH DIFFERENTIAL/PLATELET    EKG EKG Interpretation  Date/Time:  Wednesday August 05 2018 10:20:49 EST Ventricular Rate:  65 PR Interval:    QRS Duration: 78 QT Interval:  372 QTC Calculation: 387 R Axis:   81 Text Interpretation:  Sinus rhythm Borderline right axis deviation Borderline low voltage, extremity leads Nonspecific T abnrm, anterolateral leads No acute changes Confirmed by Varney Biles ([id number]) on 08/05/2018 11:44:02 AM   Radiology Dg Chest 2 View  Result Date: 08/05/2018 CLINICAL DATA:  Weakness, cough. EXAM: CHEST - 2 VIEW COMPARISON:  Radiographs of July 30, 2018. FINDINGS: The heart size and mediastinal contours are within normal limits. Both lungs are clear. No pneumothorax or pleural effusion is noted. Hyperexpansion of the lungs is noted. The visualized skeletal structures are unremarkable. IMPRESSION: No active cardiopulmonary disease. Hyperexpansion of the lungs consistent with chronic obstructive pulmonary disease. Electronically Signed   By: Marijo Conception, M.D.   On: 08/05/2018 10:27    Procedures Procedures (including critical care time)  Medications Ordered in ED Medications  albuterol (PROVENTIL HFA;VENTOLIN HFA) 108 (90 Base) MCG/ACT inhaler 2 puff (has no administration in time range)  sodium chloride 0.9 % bolus 1,000 mL (0 mLs Intravenous Stopped 08/05/18 1029)  ipratropium-albuterol (DUONEB) 0.5-2.5 (3) MG/3ML nebulizer solution 3  mL (3 mLs Nebulization Given 08/05/18 1327)     Initial Impression / Assessment and Plan / ED Course  I have reviewed the triage vital signs and the nursing notes.  Pertinent labs & imaging results that were available during my care of the patient were reviewed by me and considered in my medical decision making (see chart for details).     Patient  seen and examined.  She appears dehydrated on exam.  Work-up initiated.  Work-up ordered.  Urine pending.  Discussed with Dr. Kathrynn Humble who will see patient.  Vital signs reviewed and are as follows: BP 140/83   Pulse 60   Temp 98.7 F (37.1 C) (Oral)   Resp 19   SpO2 95%   3:02 PM patient with reassuring work-up.  Chest x-ray without pneumonia.  Lab work at baseline.  Patient discussed with and seen by Dr. Kathrynn Humble.  Patient will be discharged home with an inhaler, prescription for albuterol nebulizer solution.  Order for home health placed by Dr. Kathrynn Humble.  Will continue prednisone for an additional 4 days until patient can be established on her breathing medications.  Patient encouraged to return with worsening symptoms or other concerns.   Final Clinical Impressions(s) / ED Diagnoses   Final diagnoses:  Fatigue, unspecified type  Cough   Chronically ill patient with oxygen dependent COPD presents with fatigue.  Her roommate called the ambulance today because she has been sleeping most of the day for the past several days.  She was recently treated for COPD exacerbation.  Work-up today without any acute or emergent findings.  Patient states that she is supposed to pick up a nebulizer machine tomorrow.  She has medication for this.  Will extend prednisone prescription for additional 4 days.  No indications for admission at this time.  Home health face-to-face order placed in conjunction with social work.  ED Discharge Orders         Ordered    albuterol (PROVENTIL) (5 MG/ML) 0.5% nebulizer solution  Every 6 hours PRN     08/05/18 1442    predniSONE (DELTASONE) 20 MG tablet  Daily     08/05/18 1442           Carlisle Cater, PA-C 08/05/18 [place], Ankit, MD 08/05/18 1606

## 2018-08-05 NOTE — ED Notes (Signed)
Bed: XK48 Expected date:  Expected time:  Means of arrival:  Comments: EMS/67 y.o./aches

## 2018-08-06 DIAGNOSIS — J449 Chronic obstructive pulmonary disease, unspecified: Secondary | ICD-10-CM | POA: Diagnosis not present

## 2018-08-10 ENCOUNTER — Other Ambulatory Visit: Payer: Self-pay

## 2018-08-10 DIAGNOSIS — J40 Bronchitis, not specified as acute or chronic: Secondary | ICD-10-CM | POA: Diagnosis not present

## 2018-08-10 DIAGNOSIS — R0602 Shortness of breath: Secondary | ICD-10-CM | POA: Diagnosis not present

## 2018-08-10 DIAGNOSIS — R05 Cough: Secondary | ICD-10-CM | POA: Diagnosis not present

## 2018-08-10 DIAGNOSIS — J018 Other acute sinusitis: Secondary | ICD-10-CM | POA: Diagnosis not present

## 2018-08-10 DIAGNOSIS — R062 Wheezing: Secondary | ICD-10-CM | POA: Diagnosis not present

## 2018-08-10 NOTE — Patient Outreach (Signed)
[facility]) Care Management  08/10/2018  EVANGELYNE LOJA July 23, 1950 [id number]   Telephone Screen  Referral Date: 08/07/2018 Referral Source: [facility] Dept. Referral Reason: " 5 ED visits in 6 months, dehydration per member" Insurance: [facility] Medicare   Outreach attempt # 1 to patient. Spoke with patient. RN CM reviewed and discussed referral source and reason with patient. Patient states she was unaware of referral and was a little upset regarding it. Patient did not wish to complete screening assessment in its entirety. She kept rushing RN CM to end call. She states that she only goes to the ED when necessary and does not feel like [facility] can do anything to assist her and prevent her from going. Patient denies any issues with transportation and/or affording meds. She states she goes for MD follow up appt this afternoon. She voices that she has a friend in the home to assist her as needed. Patient declined needing any further [facility] services or assistance at this time.     Plan: RN CM will close case at this time.   Enzo Montgomery, RN,BSN,CCM [facility] Telephonic Care Management Coordinator Direct Phone: [phone] Toll Free: [phone] Fax: [phone]

## 2018-08-18 ENCOUNTER — Ambulatory Visit (INDEPENDENT_AMBULATORY_CARE_PROVIDER_SITE_OTHER): Payer: Medicare Other | Admitting: Family Medicine

## 2018-08-18 ENCOUNTER — Encounter: Payer: Self-pay | Admitting: Family Medicine

## 2018-08-18 ENCOUNTER — Other Ambulatory Visit: Payer: Self-pay

## 2018-08-18 VITALS — BP 145/78 | HR 77 | Temp 97.4°F | Ht 64.5 in | Wt 92.0 lb

## 2018-08-18 DIAGNOSIS — M818 Other osteoporosis without current pathological fracture: Secondary | ICD-10-CM

## 2018-08-18 DIAGNOSIS — Z9181 History of falling: Secondary | ICD-10-CM

## 2018-08-18 DIAGNOSIS — G4700 Insomnia, unspecified: Secondary | ICD-10-CM | POA: Diagnosis not present

## 2018-08-18 DIAGNOSIS — K660 Peritoneal adhesions (postprocedural) (postinfection): Secondary | ICD-10-CM | POA: Diagnosis not present

## 2018-08-18 DIAGNOSIS — N2581 Secondary hyperparathyroidism of renal origin: Secondary | ICD-10-CM | POA: Diagnosis not present

## 2018-08-18 DIAGNOSIS — R413 Other amnesia: Secondary | ICD-10-CM

## 2018-08-18 DIAGNOSIS — E441 Mild protein-calorie malnutrition: Secondary | ICD-10-CM

## 2018-08-18 DIAGNOSIS — Z9114 Patient's other noncompliance with medication regimen: Secondary | ICD-10-CM

## 2018-08-18 DIAGNOSIS — H3530 Unspecified macular degeneration: Secondary | ICD-10-CM

## 2018-08-18 DIAGNOSIS — Z79899 Other long term (current) drug therapy: Secondary | ICD-10-CM | POA: Diagnosis not present

## 2018-08-18 DIAGNOSIS — Z720 Tobacco use: Secondary | ICD-10-CM

## 2018-08-18 DIAGNOSIS — Z91148 Patient's other noncompliance with medication regimen for other reason: Secondary | ICD-10-CM

## 2018-08-18 DIAGNOSIS — F1120 Opioid dependence, uncomplicated: Secondary | ICD-10-CM

## 2018-08-18 DIAGNOSIS — E559 Vitamin D deficiency, unspecified: Secondary | ICD-10-CM | POA: Diagnosis not present

## 2018-08-18 DIAGNOSIS — H548 Legal blindness, as defined in USA: Secondary | ICD-10-CM

## 2018-08-18 DIAGNOSIS — R110 Nausea: Secondary | ICD-10-CM

## 2018-08-18 DIAGNOSIS — G894 Chronic pain syndrome: Secondary | ICD-10-CM

## 2018-08-18 DIAGNOSIS — G4701 Insomnia due to medical condition: Secondary | ICD-10-CM

## 2018-08-18 MED ORDER — METOCLOPRAMIDE HCL 5 MG PO TABS: 5.0000 mg | ORAL_TABLET | Freq: Three times a day (TID) | ORAL | 0 refills | Status: AC

## 2018-08-18 NOTE — Progress Notes (Signed)
Subjective:    Patient: [name]  DOB: 1951/05/29; 68 y.o.   MRN: [id number]  Chief Complaint  Patient presents with  . Chronic conditions    6 m f/u     HPI Seeing Danella Penton for pain medications.  Runs out every 4-5d prior to the end of each month.  Has been doing well until caught a cold last week from - she still doing her breathing treatment 3-4x/d but no more cough or productive than normal.  Smoking a lot still. Ended up wit the 7mg  patches.  Pain medicines are causing constipation and she feels some early satiety and nausea.   dOES [place] from the hosp for protein e supp as cant get anythign else  Medical History Past Medical History:  Diagnosis Date  . Acute duodenal ulcer with hemorrhage and perforation, with obstruction ([place]) 06/26/2004   multiple pyloric perferations  . Allergy   . Anemia   . Anxiety    sees [name] NP at Dr. Radonna Ricker office  . Asthma    hx of   . Barrett's esophagus   . Chronic abdominal pain    narcotic dependence, dr gyarteng-dak at [facility]  . 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 Complication of anesthesia    pt states had too much xanax on board - agitated   . COPD (chronic obstructive pulmonary disease) ([place])   . Depression   . Exertional shortness of breath   . Fall 11/23/2014   frequent falls  . Fx of fibula 07-28-11   left fibula, 2 places   . Fx two ribs-open 08-24-11   left 4th and 5th  . GASTRIC OUTLET OBSTRUCTION 08/28/2007   In July 2008 she underwent laparoscopic enterolysis, Nissen fundoplication over a #63 bougie, single pledgeted suture colosure of the hiatus and a 3 suture wrap.  Lap truncal vagotomy and a loop gastrojejunostomy was performed.  This was revised in August of 2009 to a roux en Y gastrojejujostomy.     . Gastroparesis 06/03/2007   Qualifier: Diagnosis of  By: [name] MD, [name]   . GERD (gastroesophageal reflux disease)   . History of blood transfusion     "related to OR; maybe when I perforated that ulcer"  . History of hiatal hernia   . Lap Nissen + truncal vagotomy July 2008 05/13/2013  . LEG EDEMA 01/19/2008   Qualifier: Diagnosis of  By: [name] MD, [name]   . Macular degeneration, bilateral    early onset, severe, legally blind and worsening. Followed by dr. Katy Fitch  . MENOPAUSE 01/07/2007   Qualifier: Diagnosis of  By: [name], CMA, [place]    . Narcotic abuse ([place])   . Osteoporosis    severe  . Oxygen deficiency   . Oxygen dependent    3L/[place]   . Peptic ulcer disease    dr Oletta Lamas  . Pneumonia, organism unspecified(486) 11/07/2010   Assoc with R Parapneumonic effusion 09/2010    - Tapped 10/05/10    - CxR resolved 11/07/2010    . S/P jejunostomy Feb 2016 07/26/2014  . Thyroid nodule 2013   benign after biopsy w/ Dr. Redmond Baseman   Past Surgical History:  Procedure Laterality Date  . BIOPSY THYROID  08-13-11   benign nodule, per Dr. Melida Quitter   . CATARACT EXTRACTION W/ INTRAOCULAR LENS  IMPLANT, BILATERAL Bilateral   . CHOLECYSTECTOMY N/A 12/02/2017   Procedure: LAPAROSCOPIC CHOLECYSTECTOMY WITH INTRAOPERATIVE CHOLANGIOGRAM ERAS PATHWAY;  Surgeon: Hassell Done,  Rodman Key, MD;  Location: [facility];  Service: General;  Laterality: N/A;  . COLONOSCOPY    . DILATION AND CURETTAGE OF UTERUS    . ESOPHAGOGASTRODUODENOSCOPY  12-29-09   dr qadeer at [facility], several gastric ulcers  . ESOPHAGOGASTRODUODENOSCOPY N/A 09/25/2012   Procedure: ESOPHAGOGASTRODUODENOSCOPY (EGD);  Surgeon: Beryle Beams, MD;  Location: [facility];  Service: Endoscopy;  Laterality: N/A;  . ESOPHAGOGASTRODUODENOSCOPY (EGD) WITH PROPOFOL N/A 06/27/2017   Procedure: ESOPHAGOGASTRODUODENOSCOPY (EGD) WITH PROPOFOL;  Surgeon: Carol Ada, MD;  Location: [facility];  Service: Endoscopy;  Laterality: N/A;  . FRACTURE SURGERY Left 2010   hip and leg s/p fall  . GASTROJEJUNOSTOMY  02/20/2008   Roux en Y gastrojejunsotomy w 1 foot Roux limb, laparotomy, takedown loop gastrojejunostomy with  removal of Ntinol stent  . GASTROSTOMY N/A 07/26/2014   Procedure: LAPRASCOPIC ASSISTED OPEN PLACEMENT OF JEJUNOSTOMY TUBE;  Surgeon: Pedro Earls, MD;  Location: [facility];  Service: General;  Laterality: N/A;  . HERNIA REPAIR     "hiatal"  . JOINT REPLACEMENT    . LAPAROSCOPIC NISSEN FUNDOPLICATION  7829  . LAPAROSCOPIC PARTIAL GASTRECTOMY N/A 06/29/2013   Procedure: Gastrectomy and GJ Tube placement;  Surgeon: Pedro Earls, MD;  Location: [facility];  Service: General;  Laterality: N/A;  . ORIF FEMORAL NECK FRACTURE W/ DHS Right 03/2014  . ORIF HIP FRACTURE Left 2010  . REPAIR OF PERFORATED ULCER    . TONSILLECTOMY  1974  . TRUNCAL VAGOTOMY  12/2006   Laparoscopic enterolysis, laparoscopic Nissen's Fundoplication, laparoscopic truncal vagotomy & loop gastrojejunostomy   Current Outpatient Medications on File Prior to Visit  Medication Sig Dispense Refill  . albuterol (PROVENTIL) (5 MG/ML) 0.5% nebulizer solution Take 1 mL (5 mg total) by nebulization every 6 (six) hours as needed for wheezing or shortness of breath. 20 mL 0  . Aspirin-Acetaminophen (GOODYS BODY PAIN PO) Take 1-2 packets by mouth every 8 (eight) hours as needed (pain).     . DULoxetine (CYMBALTA) 30 MG capsule Take 90 mg by mouth daily.     Marland Kitchen gabapentin (NEURONTIN) 300 MG capsule     . Multiple Vitamins-Minerals (CENTRUM SILVER PO) Take 1 tablet by mouth daily.    . ondansetron (ZOFRAN-ODT) 4 MG disintegrating tablet Take 4 mg by mouth 2 (two) times daily as needed for nausea/vomiting.    Marland Kitchen oxyCODONE (OXY IR/ROXICODONE) 5 MG immediate release tablet Take 1 tablet (5 mg total) by mouth every 6 (six) hours as needed for severe pain. 20 tablet 0  . predniSONE (DELTASONE) 20 MG tablet Take 2 tablets (40 mg total) by mouth daily. 8 tablet 0  . Vitamin D, Ergocalciferol, (DRISDOL) 1.25 MG (50000 UT) CAPS capsule Take 1 capsule (50,000 Units total) by mouth every 7 (seven) days. 12 capsule 0  . zolpidem (AMBIEN) 5 MG tablet Take 5  mg by mouth at bedtime.    Marland Kitchen NICOTINE TD Place 1 patch onto the skin daily.     No current facility-administered medications on file prior to visit.    Allergies  Allergen Reactions  . Lithium Nausea And Vomiting  . Celebrex [Celecoxib] Other (See Comments)    History of bleeding ulcers  . Morphine Itching  . Quetiapine Other (See Comments)     tardive dyskinesia  . Varenicline Tartrate Other (See Comments)    hallucinations   Family History  Problem Relation Age of Onset  . Cancer Mother        kidney/bladder cancer, magliant skin cancer on face,  cervical cancer  . Stroke Mother   . Hypertension Mother   . Celiac disease Father   . Cancer Father        metastatic renal cell carcinoma, pancreatic cancer, colon polyps  . Colon cancer Maternal Uncle   . Breast cancer Neg Hx   . Ovarian cancer Neg Hx   . Endometrial cancer Neg Hx    Social History   Socioeconomic History  . Marital status: Divorced    Spouse name: Not on file  . Number of children: 0  . Years of education: Not on file  . Highest education level: Not on file  Occupational History  . Not on file  Social Needs  . Financial resource strain: Not on file  . Food insecurity:    Worry: Not on file    Inability: Not on file  . Transportation needs:    Medical: Not on file    Non-medical: Not on file  Tobacco Use  . Smoking status: Current Every Day Smoker    Packs/day: 1.00    Years: 51.00    Pack years: 51.00    Types: Cigarettes  . Smokeless tobacco: Never Used  Substance and Sexual Activity  . Alcohol use: No    Alcohol/week: 0.0 standard drinks  . Drug use: No  . Sexual activity: Never  Lifestyle  . Physical activity:    Days per week: Not on file    Minutes per session: Not on file  . Stress: Not on file  Relationships  . Social connections:    Talks on phone: Not on file    Gets together: Not on file    Attends religious service: Not on file    Active member of club or organization: Not  on file    Attends meetings of clubs or organizations: Not on file    Relationship status: Not on file  Other Topics Concern  . Not on file  Social History Narrative  . Not on file   Depression screen [facility] 2/9 08/18/2018 05/04/2018 12/18/2017 09/10/2017 08/30/2017  Decreased Interest 0 0 0 0 0  Down, Depressed, Hopeless 0 0 0 0 0  PHQ - 2 Score 0 0 0 0 0  Altered sleeping - - - - -  Tired, decreased energy - - - - -  Change in appetite - - - - -  Feeling bad or failure about yourself  - - - - -  Trouble concentrating - - - - -  Moving slowly or fidgety/restless - - - - -  Suicidal thoughts - - - - -  PHQ-9 Score - - - - -  Difficult doing work/chores - - - - -  Some recent data might be hidden    ROS As noted in HPI  Objective:  BP (!) 145/78 (BP Location: Right Arm, Patient Position: Sitting, Cuff Size: Normal)   Pulse 77   Temp (!) 97.4 F (36.3 C) (Oral)   Ht 5' 4.5" (1.638 m)   Wt 92 lb (41.7 kg)   SpO2 99%   BMI 15.55 kg/m  Physical Exam Constitutional:      General: She is not in acute distress.    Appearance: She is well-groomed. She is cachectic. She is not diaphoretic.  HENT:     Head: Normocephalic and atraumatic.     Right Ear: External ear normal.     Left Ear: External ear normal.  Eyes:     General: No scleral icterus.    Conjunctiva/sclera:  Conjunctivae normal.  Neck:     Musculoskeletal: Normal range of motion and neck supple.     Thyroid: No thyromegaly.  Cardiovascular:     Rate and Rhythm: Normal rate and regular rhythm.     Heart sounds: Normal heart sounds.  Pulmonary:     Effort: Pulmonary effort is normal. No respiratory distress.     Breath sounds: Normal breath sounds.  Lymphadenopathy:     Cervical: No cervical adenopathy.  Skin:    General: Skin is warm and dry.     Findings: No erythema.  Neurological:     Mental Status: She is alert and oriented to person, place, and time.  Psychiatric:        Behavior: Behavior normal.      [facility] TEST[facility] Office Visit on 08/18/2018  Component Date Value Ref Range Status  . Vit D, 25-Hydroxy 08/18/2018 15.3* 30.0 - 100.0 ng/mL Final   Comment: Vitamin D deficiency has been defined by the [place] practice guideline as a level of serum 25-OH vitamin D less than 20 ng/mL (1,2). The Endocrine Society went on to further define vitamin D insufficiency as a level between 21 and 29 ng/mL (2). 1. IOM (Institute of Medicine). 2010. Dietary reference    intakes for calcium and D. [place]: The    [employer]. 2. Holick MF, Binkley [place], Bischoff-Ferrari HA, et al.    Evaluation, treatment, and prevention of vitamin D    deficiency: an Endocrine Society clinical practice    guideline. JCEM. 2011 Jul; 96(7):1911-30.   Marland Kitchen Glucose 08/18/2018 72  65 - 99 mg/dL Final  . BUN 08/18/2018 16  8 - 27 mg/dL Final  . Creatinine, Ser 08/18/2018 0.75  0.57 - 1.00 mg/dL Final  . GFR calc non Af Amer 08/18/2018 83  >59 mL/min/1.73 Final  . GFR calc Af Amer 08/18/2018 96  >59 mL/min/1.73 Final  . BUN/Creatinine Ratio 08/18/2018 21  12 - 28 Final  . Sodium 08/18/2018 145* 134 - 144 mmol/L Final  . Potassium 08/18/2018 5.5* 3.5 - 5.2 mmol/L Final  . Chloride 08/18/2018 112* 96 - 106 mmol/L Final  . CO2 08/18/2018 13* 20 - 29 mmol/L Final   Comment:                   Client Requested Flag Specimen quantity insufficient for verification by repeat analysis.   . Calcium 08/18/2018 9.3  8.7 - 10.3 mg/dL Final  . Total Protein 08/18/2018 6.7  6.0 - 8.5 g/dL Final  . Albumin 08/18/2018 4.3  3.8 - 4.8 g/dL Final                 **Please note reference interval change**  . Globulin, Total 08/18/2018 2.4  1.5 - 4.5 g/dL Final  . Albumin/Globulin Ratio 08/18/2018 1.8  1.2 - 2.2 Final  . Bilirubin Total 08/18/2018 <0.2  0.0 - 1.2 mg/dL Final  . Alkaline Phosphatase 08/18/2018 74  39 - 117 IU/L Final  . AST 08/18/2018 27  0 - 40 IU/L Final  . ALT  08/18/2018 13  0 - 32 IU/L Final     Assessment & Plan:   1. Vitamin D deficiency   2. Mild protein-calorie malnutrition ([place])   3. Secondary hyperparathyroidism ([place])   4. Other osteoporosis without current pathological fracture   5. Tobacco abuse   6. Chronic pain syndrome   7. Opioid type dependence, continuous ([place])   8. Insomnia due to  medical condition   9. Poor compliance with medication   10. Memory loss, short term   11. At high risk for injury related to fall   12. Legally blind   13. Early onset macular degeneration   14. Chronic nausea    Patient will continue on current chronic medications other than changes noted above, so ok to refill when needed.   See after visit summary for patient specific instructions.  Orders Placed This Encounter  Procedures  . VITAMIN D 25 Hydroxy (Vit-D Deficiency, Fractures)  . Comprehensive metabolic panel    Meds ordered this encounter  Medications  . metoCLOPramide (REGLAN) 5 MG tablet    Sig: Take 1 tablet (5 mg total) by mouth 4 (four) times daily -  before meals and at bedtime.    Dispense:  120 tablet    Refill:  0    Patient verbalized to me that they understand the following: diagnosis, what is being done for them, what to expect and what should be done at home.  Their questions have been answered. They understand that I am unable to predict every possible medication interaction or adverse outcome and that if any unexpected symptoms arise, they should contact us and their pharmacist, as well as never hesitate to seek urgent/emergent care at [facility] or ER if they think it might be warranted.    [name], MD, MPH [facility] [address] [place], [place]  [phone] Office phone  [phone] Office fax  08/18/18 1:04 PM

## 2018-08-18 NOTE — Patient Instructions (Addendum)
Start the calcium chews - like viactiv - there are the fruity, and chocolate or carmal. Take these 2-4 times a day.   You need to schedule your mammogram and your DEXA bone scan.  Please call [facility] at [phone] to schedule.   If you have lab work done today you will be contacted with your lab results within the next 2 weeks.  If you have not heard from [place] then please contact us. The fastest way to get your results is to register for My Chart.   IF you received an x-ray today, you will receive an invoice from [facility]. Please contact [facility] at [phone] with questions or concerns regarding your invoice.   IF you received labwork today, you will receive an invoice from [facility]. Please contact [facility] at [phone] with questions or concerns regarding your invoice.   Our billing staff will not be able to assist you with questions regarding bills from these companies.  You will be contacted with the lab results as soon as they are available. The fastest way to get your results is to activate your My Chart account. Instructions are located on the last page of this paperwork. If you have not heard from [place] regarding the results in 2 weeks, please contact this office.      High-Protein and High-Calorie Diet Eating high-protein and high-calorie foods can help you to gain weight, heal after an injury, and recover after an illness or surgery. The specific amount of daily protein and calories you need depends on:  Your body weight.  The reason this diet is recommended for you. What is my plan? Generally, a high-protein, high-calorie diet involves:  Eating 250-500 extra calories each day.  Making sure that you get enough of your daily calories from protein. Ask your health care provider how many of your calories should come from protein. Talk with a health care provider, such as a diet and nutrition specialist (dietitian), about  how much protein and how many calories you need each day. Follow the diet as directed by your health care provider. What are tips for following this plan?  Preparing meals  Add whole milk, half-and-half, or heavy cream to cereal, pudding, soup, or hot cocoa.  Add whole milk to instant breakfast drinks.  Add peanut butter to oatmeal or smoothies.  Add powdered milk to baked goods, smoothies, or milkshakes.  Add powdered milk, cream, or butter to mashed potatoes.  Add cheese to cooked vegetables.  Make whole-milk yogurt parfaits. Top them with granola, fruit, or nuts.  Add cottage cheese to your fruit.  Add avocado, cheese, or both to sandwiches or salads.  Add meat, poultry, or seafood to rice, pasta, casseroles, salads, and soups.  Use mayonnaise when making egg salad, chicken salad, or tuna salad.  Use peanut butter as a dip for vegetables or as a topping for pretzels, celery, or crackers.  Add beans to casseroles, dips, and spreads.  Add pureed beans to sauces and soups.  Replace calorie-free drinks with calorie-containing drinks, such as milk and fruit juice.  Replace water with milk or heavy cream when making foods such as oatmeal, pudding, or cocoa. General instructions  Ask your health care provider if you should take a nutritional supplement.  Try to eat six small meals each day instead of three large meals.  Eat a balanced diet. In each meal, include one food that is high in protein.  Keep nutritious snacks available, such as nuts, trail  mixes, dried fruit, and yogurt.  If you have kidney disease or diabetes, talk with your health care provider about how much protein is safe for you. Too much protein may put extra stress on your kidneys.  Drink your calories. Choose high-calorie drinks and have them after your meals. What high-protein foods should I eat?  Vegetables Soybeans. Peas. Grains Quinoa. Bulgur wheat. Meats and other proteins Beef, pork, and  poultry. Fish and seafood. Eggs. Tofu. Textured vegetable protein (TVP). Peanut butter. Nuts and seeds. Dried beans. Protein powders. Dairy Whole milk. Whole-milk yogurt. Powdered milk. Cheese. [employer]. Eggnog. Beverages High-protein supplement drinks. Soy milk. Other foods Protein bars. The items listed above may not be a complete list of high-protein foods and beverages. Contact a dietitian for more options. What high-calorie foods should I eat? Fruits Dried fruit. Fruit leather. Canned fruit in syrup. Fruit juice. Avocado. Vegetables Vegetables cooked in oil or butter. Fried potatoes. Grains Pasta. Quick breads. Muffins. Pancakes. Ready-to-eat cereal. Meats and other proteins Peanut butter. Nuts and seeds. Dairy Heavy cream. Whipped cream. Cream cheese. Sour cream. Ice cream. Custard. Pudding. Beverages Meal-replacement beverages. Nutrition shakes. Fruit juice. Sugar-sweetened soft drinks. Seasonings and condiments Salad dressing. Mayonnaise. Alfredo sauce. Fruit preserves or jelly. Honey. Syrup. Sweets and desserts Cake. Cookies. Pie. Pastries. Candy bars. Chocolate. Fats and oils Butter or margarine. Oil. Gravy. Other foods Meal-replacement bars. The items listed above may not be a complete list of high-calorie foods and beverages. Contact a dietitian for more options. Summary  A high-protein, high-calorie diet can help you gain weight or heal faster after an injury, illness, or surgery.  To increase your protein and calories, add ingredients such as whole milk, peanut butter, cheese, beans, meat, or seafood to meal items.  To get enough extra calories each day, include high-calorie foods and beverages at each meal.  Adding a high-calorie drink or shake can be an easy way to help you get enough calories each day. Talk with your healthcare provider or dietitian about the best options for you. This information is not intended to replace advice given to you by your  health care provider. Make sure you discuss any questions you have with your health care provider. Document Released: 06/10/2005 Document Revised: 04/22/2017 Document Reviewed: 04/22/2017 Elsevier Interactive Patient Education  2019 [employer].

## 2018-08-19 ENCOUNTER — Telehealth: Payer: Self-pay | Admitting: Family Medicine

## 2018-08-19 NOTE — Telephone Encounter (Signed)
NEW REFERRAL REQUIRED   THANK YOU

## 2018-08-19 NOTE — Telephone Encounter (Signed)
Copied from [place] [id number]. Topic: Quick Communication - See Telephone Encounter >> Aug 19, 2018 10:05 AM Berneta Levins wrote: CRM for notification. See Telephone encounter for: 08/19/18.  Pt's roommate, [name], calling in.  She would like for Dr. Brigitte Pulse to get Palliative Care involved for pt - pt states phone number is: [phone] and fax is [phone] for Hospice. States pt is legally blind, and she has gastroparesis, she is on pain management, and she just wakes up and goes right back to bed. [name] can be reached at [phone].

## 2018-08-19 NOTE — Telephone Encounter (Signed)
Pt's roommate, [name], [phone] calling in. She stated [facility] test they said they need a new order from Dr. Brigitte Pulse.  Also, the earliest appt they can get is April or May.  If Dr. Brigitte Pulse would like for her to be seen earlier than that, she will have to call [facility] and ask for a work in for the patient.

## 2018-08-20 LAB — COMPREHENSIVE METABOLIC PANEL
ALT: 13 IU/L (ref 0–32)
AST: 27 IU/L (ref 0–40)
Albumin/Globulin Ratio: 1.8 (ref 1.2–2.2)
Albumin: 4.3 g/dL (ref 3.8–4.8)
Alkaline Phosphatase: 74 IU/L (ref 39–117)
BUN/Creatinine Ratio: 21 (ref 12–28)
BUN: 16 mg/dL (ref 8–27)
Bilirubin Total: <0.2 mg/dL (ref 0.0–1.2)
CO2: 13 mmol/L — CL (ref 20–29)
Calcium: 9.3 mg/dL (ref 8.7–10.3)
Chloride: 112 mmol/L — ABNORMAL HIGH (ref 96–106)
Creatinine, Ser: 0.75 mg/dL (ref 0.57–1.00)
GFR calc Af Amer: 96 mL/min/{1.73_m2} (ref >59)
GFR calc non Af Amer: 83 mL/min/{1.73_m2} (ref >59)
Globulin, Total: 2.4 g/dL (ref 1.5–4.5)
Glucose: 72 mg/dL (ref 65–99)
Potassium: 5.5 mmol/L — ABNORMAL HIGH (ref 3.5–5.2)
Sodium: 145 mmol/L — ABNORMAL HIGH (ref 134–144)
Total Protein: 6.7 g/dL (ref 6.0–8.5)

## 2018-08-20 LAB — VITAMIN D 25 HYDROXY (VIT D DEFICIENCY, FRACTURES): Vit D, 25-Hydroxy: 15.3 ng/mL — ABNORMAL LOW (ref 30.0–100.0)

## 2018-08-21 DIAGNOSIS — J449 Chronic obstructive pulmonary disease, unspecified: Secondary | ICD-10-CM | POA: Diagnosis not present

## 2018-08-24 ENCOUNTER — Telehealth: Payer: Self-pay | Admitting: Family Medicine

## 2018-08-24 NOTE — Telephone Encounter (Signed)
Dr Brigitte Pulse,  Is this time frame ok?

## 2018-08-24 NOTE — Telephone Encounter (Signed)
Copied from [place] [id number]. Topic: Medical Record Request - Patient ROI Request >> Aug 21, 2018 12:28 PM Rutherford Nail, NT wrote: Patient Name/DOB/MRN #: [name] / 12/29/1950 / [id number] Requestor Name/Agency: Patient requesting to send records to [facility] Call Back #: [phone] Information Requested: Full chart be sent to [facility] to [facility]. For all other clinics, route to the clinic's PEC Pool.

## 2018-08-28 NOTE — Telephone Encounter (Signed)
I will complete this when they are accepting medical records.

## 2018-08-31 ENCOUNTER — Institutional Professional Consult (permissible substitution): Payer: Self-pay | Admitting: Internal Medicine

## 2018-09-04 DIAGNOSIS — J449 Chronic obstructive pulmonary disease, unspecified: Secondary | ICD-10-CM | POA: Diagnosis not present

## 2018-09-06 DIAGNOSIS — Z760 Encounter for issue of repeat prescription: Secondary | ICD-10-CM | POA: Diagnosis not present

## 2018-09-06 DIAGNOSIS — Z79899 Other long term (current) drug therapy: Secondary | ICD-10-CM | POA: Diagnosis not present

## 2018-09-07 ENCOUNTER — Emergency Department (HOSPITAL_COMMUNITY): Payer: Medicare Other

## 2018-09-07 ENCOUNTER — Emergency Department (HOSPITAL_COMMUNITY)
Admission: EM | Admit: 2018-09-07 | Discharge: 2018-09-07 | Disposition: A | Discharge status: Home / Self Care | Payer: Medicare Other | Attending: Emergency Medicine | Admitting: Emergency Medicine

## 2018-09-07 ENCOUNTER — Other Ambulatory Visit: Payer: Self-pay

## 2018-09-07 ENCOUNTER — Encounter (HOSPITAL_COMMUNITY): Payer: Self-pay

## 2018-09-07 DIAGNOSIS — F17210 Nicotine dependence, cigarettes, uncomplicated: Secondary | ICD-10-CM | POA: Diagnosis not present

## 2018-09-07 DIAGNOSIS — Z9981 Dependence on supplemental oxygen: Secondary | ICD-10-CM | POA: Diagnosis not present

## 2018-09-07 DIAGNOSIS — R05 Cough: Secondary | ICD-10-CM | POA: Diagnosis not present

## 2018-09-07 DIAGNOSIS — J449 Chronic obstructive pulmonary disease, unspecified: Secondary | ICD-10-CM | POA: Diagnosis not present

## 2018-09-07 DIAGNOSIS — I2510 Atherosclerotic heart disease of native coronary artery without angina pectoris: Secondary | ICD-10-CM | POA: Insufficient documentation

## 2018-09-07 DIAGNOSIS — R1084 Generalized abdominal pain: Secondary | ICD-10-CM | POA: Diagnosis not present

## 2018-09-07 DIAGNOSIS — R531 Weakness: Secondary | ICD-10-CM

## 2018-09-07 DIAGNOSIS — R110 Nausea: Secondary | ICD-10-CM | POA: Diagnosis not present

## 2018-09-07 DIAGNOSIS — R059 Cough, unspecified: Secondary | ICD-10-CM

## 2018-09-07 LAB — COMPREHENSIVE METABOLIC PANEL
ALT: 11 U/L (ref 0–44)
AST: 22 U/L (ref 15–41)
Albumin: 3.9 g/dL (ref 3.5–5.0)
Alkaline Phosphatase: 88 U/L (ref 38–126)
Anion gap: 12 (ref 5–15)
BUN: 15 mg/dL (ref 8–23)
CO2: 29 mmol/L (ref 22–32)
Calcium: 9 mg/dL (ref 8.9–10.3)
Chloride: 98 mmol/L (ref 98–111)
Creatinine, Ser: 0.78 mg/dL (ref 0.44–1.00)
GFR calc non Af Amer: >60 mL/min (ref >60)
Glucose, Bld: 147 mg/dL — ABNORMAL HIGH (ref 70–99)
POTASSIUM: 4.2 mmol/L (ref 3.5–5.1)
SODIUM: 139 mmol/L (ref 135–145)
Total Bilirubin: 0.4 mg/dL (ref 0.3–1.2)
Total Protein: 7 g/dL (ref 6.5–8.1)

## 2018-09-07 LAB — URINALYSIS, ROUTINE W REFLEX MICROSCOPIC
Bacteria, UA: NONE SEEN
Glucose, UA: NEGATIVE mg/dL
HGB URINE DIPSTICK: NEGATIVE
Ketones, ur: 5 mg/dL — AB
Leukocytes,Ua: NEGATIVE
Nitrite: NEGATIVE
Protein, ur: 100 mg/dL — AB
Specific Gravity, Urine: 1.039 — ABNORMAL HIGH (ref 1.005–1.030)
pH: 5 (ref 5.0–8.0)

## 2018-09-07 LAB — CBC
HCT: 44.8 % (ref 36.0–46.0)
HEMOGLOBIN: 13.5 g/dL (ref 12.0–15.0)
MCH: 29.3 pg (ref 26.0–34.0)
MCHC: 30.1 g/dL (ref 30.0–36.0)
MCV: 97.2 fL (ref 80.0–100.0)
NRBC: 0 % (ref 0.0–0.2)
Platelets: 358 10*3/uL (ref 150–400)
RBC: 4.61 MIL/uL (ref 3.87–5.11)
RDW: 16 % — ABNORMAL HIGH (ref 11.5–15.5)
WBC: 6.2 10*3/uL (ref 4.0–10.5)

## 2018-09-07 LAB — LIPASE, BLOOD: LIPASE: 23 U/L (ref 11–51)

## 2018-09-07 MED ORDER — SODIUM CHLORIDE 0.9 % IV BOLUS
500.0000 mL | Freq: Once | INTRAVENOUS | Status: AC
  Administered 2018-09-07: 500 mL via INTRAVENOUS

## 2018-09-07 MED ORDER — SODIUM CHLORIDE 0.9% FLUSH
3.0000 mL | Freq: Once | INTRAVENOUS | Status: AC
  Administered 2018-09-07: 3 mL via INTRAVENOUS

## 2018-09-07 NOTE — ED Triage Notes (Signed)
Pt c/o muscle weakness, shaking, decreased appetite, family states that pt "won't eat or drink"

## 2018-09-07 NOTE — ED Notes (Signed)
Pt observed in ED lobby eating a Big Mac.

## 2018-09-07 NOTE — ED Notes (Signed)
Patient transported to X-ray 

## 2018-09-07 NOTE — ED Provider Notes (Signed)
Emergency Department Provider Note   I have reviewed the triage vital signs and the nursing notes.   HISTORY  Chief Complaint Fatigue and Nausea   HPI [name] is a 68 y.o. female with PMH of COPD, anxiety, and gastroparesis presents to the emergency department for evaluation of generalized weakness, dehydration, nausea.  Patient reports 15 pounds of unintentional weight loss over the past year.  She reports that she has been given Zofran for her nausea and this improves her symptoms.  It allows her to eat but feels like she does not have much of an appetite at baseline.  She states that she has never liked drinking water but does drink sodas and juices.  Family, at bedside, is concerned about possible dehydration.  Patient has a primary care physician who she follows with regularly.  She has not experienced any chest pain, shortness of breath, flulike symptoms.  No travel history.  Denies abdominal pain at this time.  No radiation of symptoms or modifying factors.  Past Medical History:  Diagnosis Date  . Acute duodenal ulcer with hemorrhage and perforation, with obstruction ([place]) 06/26/2004   multiple pyloric perferations  . Allergy   . Anemia   . Anxiety    sees [name] NP at Dr. Radonna Ricker office  . Asthma    hx of   . Barrett's esophagus   . Chronic abdominal pain    narcotic dependence, dr gyarteng-dak at [facility]  . 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 Complication of anesthesia    pt states had too much xanax on board - agitated   . COPD (chronic obstructive pulmonary disease) ([place])   . Depression   . Exertional shortness of breath   . Fall 11/23/2014   frequent falls  . Fx of fibula 07-28-11   left fibula, 2 places   . Fx two ribs-open 08-24-11   left 4th and 5th  . GASTRIC OUTLET OBSTRUCTION 08/28/2007   In July 2008 she underwent laparoscopic enterolysis, Nissen fundoplication over a #83 bougie, single pledgeted  suture colosure of the hiatus and a 3 suture wrap.  Lap truncal vagotomy and a loop gastrojejunostomy was performed.  This was revised in August of 2009 to a roux en Y gastrojejujostomy.     . Gastroparesis 06/03/2007   Qualifier: Diagnosis of  By: [name] MD, [name]   . GERD (gastroesophageal reflux disease)   . History of blood transfusion    "related to OR; maybe when I perforated that ulcer"  . History of hiatal hernia   . Lap Nissen + truncal vagotomy July 2008 05/13/2013  . LEG EDEMA 01/19/2008   Qualifier: Diagnosis of  By: [name] MD, [name]   . Macular degeneration, bilateral    early onset, severe, legally blind and worsening. Followed by dr. Katy Fitch  . MENOPAUSE 01/07/2007   Qualifier: Diagnosis of  By: [name], CMA, [place]    . Narcotic abuse ([place])   . Osteoporosis    severe  . Oxygen deficiency   . Oxygen dependent    3L/[place]   . Peptic ulcer disease    dr Oletta Lamas  . Pneumonia, organism unspecified(486) 11/07/2010   Assoc with R Parapneumonic effusion 09/2010    - Tapped 10/05/10    - CxR resolved 11/07/2010    . S/P jejunostomy Feb 2016 07/26/2014  . Thyroid nodule 2013   benign after biopsy w/ Dr. Redmond Baseman    Patient Active Problem List  Diagnosis Date Noted  . Abdominal pain, chronic, epigastric 05/31/2017  . Episodic polysubstance dependence ([place]) 05/31/2017  . At high risk for injury related to fall 05/18/2017  . Chronic nausea 05/18/2017  . Poor compliance with medication 11/20/2016  . Insomnia 11/20/2016  . Early onset macular degeneration 11/20/2016  . Legally blind 11/20/2016  . Abdominal pain   . Hypoxia 04/08/2015  . Opioid type dependence, continuous ([place])   . Protein-calorie malnutrition ([place])   . Episode of recurrent major depressive disorder ([place])   . Secondary hyperparathyroidism ([place]) 01/10/2015  . Vitamin D deficiency 01/10/2015  . 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 Anemia, iron deficiency 11/03/2014  . Chronic pain syndrome 10/24/2014  .  Osteoporosis 10/05/2014  . Vertebral compression fracture ([place]) 10/05/2014  . Coronary artery disease due to calcified coronary lesion 10/05/2014  . Weight loss   . Tobacco abuse   . History of subtotal gastrectomy with Roux-en-Y recontruction   . Orthostatic hypotension 08/11/2014  . Memory loss, short term 07/18/2014  . Constipation, chronic 07/10/2013  . Chronic abdominal pain   . COPD (chronic obstructive pulmonary disease) ([place]) 11/07/2010  . Major depressive disorder, recurrent episode, moderate ([place]) 06/06/2010  . 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 Anxiety state 01/07/2007  . Barrett's esophagus 07/03/2005    Past Surgical History:  Procedure Laterality Date  . BIOPSY THYROID  08-13-11   benign nodule, per Dr. Melida Quitter   . CATARACT EXTRACTION W/ INTRAOCULAR LENS  IMPLANT, BILATERAL Bilateral   . CHOLECYSTECTOMY N/A 12/02/2017   Procedure: LAPAROSCOPIC CHOLECYSTECTOMY WITH INTRAOPERATIVE CHOLANGIOGRAM ERAS PATHWAY;  Surgeon: Johnathan Hausen, MD;  Location: [facility];  Service: General;  Laterality: N/A;  . COLONOSCOPY    . DILATION AND CURETTAGE OF UTERUS    . ESOPHAGOGASTRODUODENOSCOPY  12-29-09   dr qadeer at [facility], several gastric ulcers  . ESOPHAGOGASTRODUODENOSCOPY N/A 09/25/2012   Procedure: ESOPHAGOGASTRODUODENOSCOPY (EGD);  Surgeon: Beryle Beams, MD;  Location: [facility];  Service: Endoscopy;  Laterality: N/A;  . ESOPHAGOGASTRODUODENOSCOPY (EGD) WITH PROPOFOL N/A 06/27/2017   Procedure: ESOPHAGOGASTRODUODENOSCOPY (EGD) WITH PROPOFOL;  Surgeon: Carol Ada, MD;  Location: [facility];  Service: Endoscopy;  Laterality: N/A;  . FRACTURE SURGERY Left 2010   hip and leg s/p fall  . GASTROJEJUNOSTOMY  02/20/2008   Roux en Y gastrojejunsotomy w 1 foot Roux limb, laparotomy, takedown loop gastrojejunostomy with removal of Ntinol stent  . GASTROSTOMY N/A 07/26/2014   Procedure: LAPRASCOPIC ASSISTED OPEN PLACEMENT OF  JEJUNOSTOMY TUBE;  Surgeon: Pedro Earls, MD;  Location: [facility];  Service: General;  Laterality: N/A;  . HERNIA REPAIR     "hiatal"  . JOINT REPLACEMENT    . LAPAROSCOPIC NISSEN FUNDOPLICATION  5701  . LAPAROSCOPIC PARTIAL GASTRECTOMY N/A 06/29/2013   Procedure: Gastrectomy and GJ Tube placement;  Surgeon: Pedro Earls, MD;  Location: [facility];  Service: General;  Laterality: N/A;  . ORIF FEMORAL NECK FRACTURE W/ DHS Right 03/2014  . ORIF HIP FRACTURE Left 2010  . REPAIR OF PERFORATED ULCER    . TONSILLECTOMY  1974  . TRUNCAL VAGOTOMY  12/2006   Laparoscopic enterolysis, laparoscopic Nissen's Fundoplication, laparoscopic truncal vagotomy & loop gastrojejunostomy    Allergies Lithium; Celebrex [celecoxib]; Morphine; Quetiapine; and Varenicline tartrate  Family History  Problem Relation Age of Onset  . Cancer Mother        kidney/bladder cancer, magliant skin cancer on face, cervical cancer  . Stroke Mother   . Hypertension  Mother   . Celiac disease Father   . Cancer Father        metastatic renal cell carcinoma, pancreatic cancer, colon polyps  . Colon cancer Maternal Uncle   . Breast cancer Neg Hx   . Ovarian cancer Neg Hx   . Endometrial cancer Neg Hx     Social History Social History   Tobacco Use  . Smoking status: Current Every Day Smoker    Packs/day: 1.00    Years: 51.00    Pack years: 51.00    Types: Cigarettes  . Smokeless tobacco: Never Used  Substance Use Topics  . Alcohol use: No    Alcohol/week: 0.0 standard drinks  . Drug use: No    Review of Systems  Constitutional: No fever/chills. Positive generalized weakness and fatigue.  Eyes: No visual changes. ENT: No sore throat. Cardiovascular: Denies chest pain.  Respiratory: Denies shortness of breath. Mild cough.  Gastrointestinal: No abdominal pain. Positive nausea, no vomiting.  No diarrhea.  No constipation. Decreased appetite.  Genitourinary: Negative for dysuria. Musculoskeletal: Negative  for back pain. Skin: Negative for rash. Neurological: Negative for headaches, focal weakness or numbness.  10-point ROS otherwise negative.  ____________________________________________   PHYSICAL EXAM:  VITAL SIGNS: ED Triage Vitals  Enc Vitals Group     BP 09/07/18 1624 (!) 147/81     Pulse Rate 09/07/18 1624 70     Resp 09/07/18 1833 16     Temp 09/07/18 1624 98.2 F (36.8 C)     Temp Source 09/07/18 1624 Oral     SpO2 09/07/18 1624 95 %     Weight 09/07/18 1625 93 lb (42.2 kg)     Height 09/07/18 1625 5\' 3"  (1.6 m)     Pain Score 09/07/18 1625 0   Constitutional: Alert and oriented. Well appearing and in no acute distress. Eyes: Conjunctivae are normal. Head: Atraumatic. Nose: No congestion/rhinnorhea. Mouth/Throat: Mucous membranes are moist.  Neck: No stridor.   Cardiovascular: Normal rate, regular rhythm. Good peripheral circulation. Grossly normal heart sounds.   Respiratory: Normal respiratory effort.  No retractions. Lungs CTAB. Gastrointestinal: Soft and nontender. No distention.  Musculoskeletal: No lower extremity tenderness nor edema. No gross deformities of extremities. Neurologic:  Normal speech and language. No gross focal neurologic deficits are appreciated.  Skin:  Skin is warm, dry and intact. No rash noted.  ____________________________________________   LABS (all labs ordered are listed, but only abnormal results are displayed)  Labs Reviewed  COMPREHENSIVE METABOLIC PANEL - Abnormal; Notable for the following components:      Result Value   Glucose, Bld 147 (*)    All other components within normal limits  CBC - Abnormal; Notable for the following components:   RDW 16.0 (*)    All other components within normal limits  URINALYSIS, ROUTINE W REFLEX MICROSCOPIC - Abnormal; Notable for the following components:   Specific Gravity, Urine 1.039 (*)    Bilirubin Urine SMALL (*)    Ketones, ur 5 (*)    Protein, ur 100 (*)    All other components  within normal limits  LIPASE, BLOOD   ____________________________________________  RADIOLOGY  Dg Chest 2 View  Result Date: 09/07/2018 CLINICAL DATA:  Cough, muscle weakness. EXAM: CHEST - 2 VIEW COMPARISON:  08/05/2018 FINDINGS: Heart and mediastinal contours are within normal limits. No focal opacities or effusions. No acute bony abnormality. Thoracolumbar scoliosis. IMPRESSION: No active cardiopulmonary disease. Electronically Signed   By: Rolm Baptise M.D.   On: 09/07/2018  19:15    ____________________________________________   PROCEDURES  Procedure(s) performed:   Procedures  None  ____________________________________________   INITIAL IMPRESSION / ASSESSMENT AND PLAN / ED COURSE  Pertinent labs & imaging results that were available during my care of the patient were reviewed by me and considered in my medical decision making (see chart for details).  Patient presents to the emergency department with generalized weakness and nausea.  Symptoms have been mostly chronic and ongoing.  She is not experiencing any chest pain.  My concern for atypical ACS is extremely low.  No fevers or flulike symptoms.  No travel history.  Abdomen is soft and nontender.  Neurologic exam is normal.  Patient does appear mild to moderately dehydrated.  Plan for IV fluids, urinalysis.  Patient does have history of COPD and active smoking.  Will obtain a screening chest x-ray.  Advise close PCP follow-up pending results here.   CXR and labs reviewed. No acute distress on re-evaluation. Patient tolerating PO in the ED without difficulty. Plan for PCP follow up for continued outpatient mgmt and health maintenance. Discussed ED return precautions. Patient agreeable with plan at discharge and eager to return home. No significant COVID risk or symptoms to suspect flu or other respiratory viral infection.  ____________________________________________  FINAL CLINICAL IMPRESSION(S) / ED DIAGNOSES  Final  diagnoses:  Generalized weakness  Nausea  Cough    MEDICATIONS GIVEN DURING THIS VISIT:  Medications  sodium chloride flush (NS) 0.9 % injection 3 mL (3 mLs Intravenous Given 09/07/18 1835)  sodium chloride 0.9 % bolus 500 mL (0 mLs Intravenous Stopped 09/07/18 1938)   Note:  This document was prepared using Dragon voice recognition software and may include unintentional dictation errors.  Nanda Quinton, MD Emergency Medicine    Long, Wonda Olds, MD 09/07/18 [phone]

## 2018-09-07 NOTE — Discharge Instructions (Signed)
Your labs and chest x-ray are normal. Call your PCP tomorrow and return to the ED with any new or worsening symptoms.

## 2018-09-08 ENCOUNTER — Ambulatory Visit: Payer: Medicare Other | Admitting: Family Medicine

## 2018-09-15 DIAGNOSIS — G4700 Insomnia, unspecified: Secondary | ICD-10-CM | POA: Diagnosis not present

## 2018-09-15 DIAGNOSIS — R110 Nausea: Secondary | ICD-10-CM | POA: Diagnosis not present

## 2018-09-15 DIAGNOSIS — K660 Peritoneal adhesions (postprocedural) (postinfection): Secondary | ICD-10-CM | POA: Diagnosis not present

## 2018-09-15 DIAGNOSIS — Z79899 Other long term (current) drug therapy: Secondary | ICD-10-CM | POA: Diagnosis not present

## 2018-09-16 ENCOUNTER — Institutional Professional Consult (permissible substitution): Payer: Self-pay | Admitting: Internal Medicine

## 2018-09-19 DIAGNOSIS — J449 Chronic obstructive pulmonary disease, unspecified: Secondary | ICD-10-CM | POA: Diagnosis not present

## 2018-09-26 DIAGNOSIS — Z87891 Personal history of nicotine dependence: Secondary | ICD-10-CM | POA: Diagnosis not present

## 2018-09-26 DIAGNOSIS — Z9189 Other specified personal risk factors, not elsewhere classified: Secondary | ICD-10-CM | POA: Diagnosis not present

## 2018-09-26 DIAGNOSIS — J069 Acute upper respiratory infection, unspecified: Secondary | ICD-10-CM | POA: Diagnosis not present

## 2018-09-29 DIAGNOSIS — Z87891 Personal history of nicotine dependence: Secondary | ICD-10-CM | POA: Diagnosis not present

## 2018-09-29 DIAGNOSIS — M791 Myalgia, unspecified site: Secondary | ICD-10-CM | POA: Diagnosis not present

## 2018-09-29 DIAGNOSIS — J069 Acute upper respiratory infection, unspecified: Secondary | ICD-10-CM | POA: Diagnosis not present

## 2018-09-29 DIAGNOSIS — R110 Nausea: Secondary | ICD-10-CM | POA: Diagnosis not present

## 2018-10-05 DIAGNOSIS — J449 Chronic obstructive pulmonary disease, unspecified: Secondary | ICD-10-CM | POA: Diagnosis not present

## 2018-10-07 ENCOUNTER — Institutional Professional Consult (permissible substitution): Payer: Self-pay | Admitting: Internal Medicine

## 2018-10-12 DIAGNOSIS — Z79891 Long term (current) use of opiate analgesic: Secondary | ICD-10-CM | POA: Diagnosis not present

## 2018-10-12 DIAGNOSIS — K660 Peritoneal adhesions (postprocedural) (postinfection): Secondary | ICD-10-CM | POA: Diagnosis not present

## 2018-10-12 DIAGNOSIS — R110 Nausea: Secondary | ICD-10-CM | POA: Diagnosis not present

## 2018-10-12 DIAGNOSIS — Z79899 Other long term (current) drug therapy: Secondary | ICD-10-CM | POA: Diagnosis not present

## 2018-10-20 DIAGNOSIS — J449 Chronic obstructive pulmonary disease, unspecified: Secondary | ICD-10-CM | POA: Diagnosis not present

## 2018-10-21 DIAGNOSIS — R110 Nausea: Secondary | ICD-10-CM | POA: Diagnosis not present

## 2018-10-21 DIAGNOSIS — R1110 Vomiting, unspecified: Secondary | ICD-10-CM | POA: Diagnosis not present

## 2018-10-21 DIAGNOSIS — Z9189 Other specified personal risk factors, not elsewhere classified: Secondary | ICD-10-CM | POA: Diagnosis not present

## 2018-10-21 DIAGNOSIS — E538 Deficiency of other specified B group vitamins: Secondary | ICD-10-CM | POA: Diagnosis not present

## 2018-10-21 DIAGNOSIS — R350 Frequency of micturition: Secondary | ICD-10-CM | POA: Diagnosis not present

## 2018-10-26 DIAGNOSIS — R110 Nausea: Secondary | ICD-10-CM | POA: Diagnosis not present

## 2018-10-26 DIAGNOSIS — E7800 Pure hypercholesterolemia, unspecified: Secondary | ICD-10-CM | POA: Diagnosis not present

## 2018-10-26 DIAGNOSIS — R1084 Generalized abdominal pain: Secondary | ICD-10-CM | POA: Diagnosis not present

## 2018-10-26 DIAGNOSIS — R5383 Other fatigue: Secondary | ICD-10-CM | POA: Diagnosis not present

## 2018-11-04 DIAGNOSIS — J449 Chronic obstructive pulmonary disease, unspecified: Secondary | ICD-10-CM | POA: Diagnosis not present

## 2018-11-05 DIAGNOSIS — Z9189 Other specified personal risk factors, not elsewhere classified: Secondary | ICD-10-CM | POA: Diagnosis not present

## 2018-11-05 DIAGNOSIS — R110 Nausea: Secondary | ICD-10-CM | POA: Diagnosis not present

## 2018-11-05 DIAGNOSIS — J069 Acute upper respiratory infection, unspecified: Secondary | ICD-10-CM | POA: Diagnosis not present

## 2018-11-09 DIAGNOSIS — M129 Arthropathy, unspecified: Secondary | ICD-10-CM | POA: Diagnosis not present

## 2018-11-09 DIAGNOSIS — R110 Nausea: Secondary | ICD-10-CM | POA: Diagnosis not present

## 2018-11-09 DIAGNOSIS — E7800 Pure hypercholesterolemia, unspecified: Secondary | ICD-10-CM | POA: Diagnosis not present

## 2018-11-09 DIAGNOSIS — G4700 Insomnia, unspecified: Secondary | ICD-10-CM | POA: Diagnosis not present

## 2018-11-09 DIAGNOSIS — E559 Vitamin D deficiency, unspecified: Secondary | ICD-10-CM | POA: Diagnosis not present

## 2018-11-09 DIAGNOSIS — R5383 Other fatigue: Secondary | ICD-10-CM | POA: Diagnosis not present

## 2018-11-09 DIAGNOSIS — Z79899 Other long term (current) drug therapy: Secondary | ICD-10-CM | POA: Diagnosis not present

## 2018-11-09 DIAGNOSIS — E538 Deficiency of other specified B group vitamins: Secondary | ICD-10-CM | POA: Diagnosis not present

## 2018-11-12 DIAGNOSIS — J069 Acute upper respiratory infection, unspecified: Secondary | ICD-10-CM | POA: Diagnosis not present

## 2018-11-12 DIAGNOSIS — Z9189 Other specified personal risk factors, not elsewhere classified: Secondary | ICD-10-CM | POA: Diagnosis not present

## 2018-11-19 DIAGNOSIS — J449 Chronic obstructive pulmonary disease, unspecified: Secondary | ICD-10-CM | POA: Diagnosis not present

## 2018-11-25 ENCOUNTER — Institutional Professional Consult (permissible substitution): Payer: Medicare Other | Admitting: Internal Medicine

## 2018-11-27 ENCOUNTER — Other Ambulatory Visit: Payer: Self-pay | Admitting: Acute Care

## 2018-11-27 DIAGNOSIS — F17210 Nicotine dependence, cigarettes, uncomplicated: Secondary | ICD-10-CM

## 2018-11-27 DIAGNOSIS — Z122 Encounter for screening for malignant neoplasm of respiratory organs: Secondary | ICD-10-CM

## 2018-12-01 ENCOUNTER — Telehealth: Payer: Self-pay | Admitting: Family Medicine

## 2018-12-01 NOTE — Telephone Encounter (Signed)
Called pt back to set up appt for stomach pain LVM for her to call us back and get her seen tomorrow FR

## 2018-12-15 ENCOUNTER — Other Ambulatory Visit: Payer: Self-pay

## 2018-12-15 ENCOUNTER — Emergency Department (HOSPITAL_COMMUNITY)
Admission: EM | Admit: 2018-12-15 | Discharge: 2018-12-15 | Disposition: A | Discharge status: Home / Self Care | Payer: Medicare Other | Attending: Emergency Medicine | Admitting: Emergency Medicine

## 2018-12-15 ENCOUNTER — Encounter (HOSPITAL_COMMUNITY): Payer: Self-pay

## 2018-12-15 DIAGNOSIS — Z711 Person with feared health complaint in whom no diagnosis is made: Secondary | ICD-10-CM | POA: Insufficient documentation

## 2018-12-15 DIAGNOSIS — Z79899 Other long term (current) drug therapy: Secondary | ICD-10-CM | POA: Insufficient documentation

## 2018-12-15 DIAGNOSIS — J449 Chronic obstructive pulmonary disease, unspecified: Secondary | ICD-10-CM | POA: Insufficient documentation

## 2018-12-15 DIAGNOSIS — F17210 Nicotine dependence, cigarettes, uncomplicated: Secondary | ICD-10-CM | POA: Insufficient documentation

## 2018-12-15 DIAGNOSIS — E860 Dehydration: Secondary | ICD-10-CM | POA: Diagnosis present

## 2018-12-15 LAB — CBC WITH DIFFERENTIAL/PLATELET
Abs Immature Granulocytes: 0.01 10*3/uL (ref 0.00–0.07)
Basophils Absolute: 0.1 10*3/uL (ref 0.0–0.1)
Basophils Relative: 2 %
Eosinophils Absolute: 0.1 10*3/uL (ref 0.0–0.5)
Eosinophils Relative: 1 %
HCT: 38.7 % (ref 36.0–46.0)
Hemoglobin: 12 g/dL (ref 12.0–15.0)
Immature Granulocytes: 0 %
Lymphocytes Relative: 17 %
Lymphs Abs: 1 10*3/uL (ref 0.7–4.0)
MCH: 30 pg (ref 26.0–34.0)
MCHC: 31 g/dL (ref 30.0–36.0)
MCV: 96.8 fL (ref 80.0–100.0)
Monocytes Absolute: 0.5 10*3/uL (ref 0.1–1.0)
Monocytes Relative: 9 %
Neutro Abs: 4.1 10*3/uL (ref 1.7–7.7)
Neutrophils Relative %: 71 %
Platelets: 249 10*3/uL (ref 150–400)
RBC: 4 MIL/uL (ref 3.87–5.11)
RDW: 12.9 % (ref 11.5–15.5)
WBC: 5.8 10*3/uL (ref 4.0–10.5)
nRBC: 0 % (ref 0.0–0.2)

## 2018-12-15 LAB — COMPREHENSIVE METABOLIC PANEL
ALT: 14 U/L (ref 0–44)
AST: 18 U/L (ref 15–41)
Albumin: 3.5 g/dL (ref 3.5–5.0)
Alkaline Phosphatase: 109 U/L (ref 38–126)
Anion gap: 11 (ref 5–15)
BUN: 8 mg/dL (ref 8–23)
CO2: 29 mmol/L (ref 22–32)
Calcium: 8.8 mg/dL — ABNORMAL LOW (ref 8.9–10.3)
Chloride: 96 mmol/L — ABNORMAL LOW (ref 98–111)
Creatinine, Ser: 0.58 mg/dL (ref 0.44–1.00)
GFR calc Af Amer: >60 mL/min (ref >60)
GFR calc non Af Amer: >60 mL/min (ref >60)
Glucose, Bld: 130 mg/dL — ABNORMAL HIGH (ref 70–99)
Potassium: 3.9 mmol/L (ref 3.5–5.1)
Sodium: 136 mmol/L (ref 135–145)
Total Bilirubin: 0.4 mg/dL (ref 0.3–1.2)
Total Protein: 6.3 g/dL — ABNORMAL LOW (ref 6.5–8.1)

## 2018-12-15 LAB — LIPASE, BLOOD: Lipase: 17 U/L (ref 11–51)

## 2018-12-15 MED ORDER — SODIUM CHLORIDE 0.9 % IV BOLUS
250.0000 mL | Freq: Once | INTRAVENOUS | Status: AC
  Administered 2018-12-15: 250 mL via INTRAVENOUS

## 2018-12-15 MED ORDER — SODIUM CHLORIDE 0.9% FLUSH: 3.0000 mL | Freq: Once | INTRAVENOUS | Status: DC

## 2018-12-15 NOTE — ED Triage Notes (Addendum)
Patient dropped off by roommate. Patient unable to answer questions. Called patients friend for more information. [phone]  Roommate states "About 2 days ago patient was unable to eat or drink due to generalized abdominal pain".   Denies N/V or diarrhea.   Patient states, " I just feel bad".    Patient went to PCP about a week ago and they were unable to do blood work, couldn't find a vein".   Called pcp this morning and told to come to ED.  Patient is on 02 at home but, patient does not know how much.  Hx. Gastroparesis and COPD per friend.

## 2018-12-15 NOTE — ED Notes (Signed)
Assisted patient to call roommate.

## 2018-12-15 NOTE — Discharge Instructions (Addendum)
You have been diagnosed today with Concern for disease without diagnosis.  At this time there does not appear to be the presence of an emergent medical condition, however there is always the potential for conditions to change. Please read and follow the below instructions.  Please return to the Emergency Department immediately for any new or worsening symptoms. Please be sure to follow up with your Primary Care Provider within one week regarding your visit today; please call their office to schedule an appointment even if you are feeling better for a follow-up visit.  Get help right away if: You have pain You vomit or have diarrhea You have bloody or black poop, or poop that looks like tar. You have very bad pain, cramping, or bloating in your belly. You have signs of not having enough fluid or water in your body (dehydration), such as: Dark pee, very little pee, or no pee. Cracked lips. Dry mouth. Sunken eyes. Sleepiness. Weakness.  Please read the additional information packets attached to your discharge summary.  Do not take your medicine if  develop an itchy rash, swelling in your mouth or lips, or difficulty breathing; call 911 and seek immediate emergency medical attention if this occurs.

## 2018-12-15 NOTE — ED Provider Notes (Signed)
[facility] DEPT Provider Note   CSN: [id number] Arrival date & time: 12/15/18  1036    History   Chief Complaint Chief Complaint  Patient presents with   Dehydration    HPI [name] is a 68 y.o. female with history of COPD, ulcer disease, gastroparesis, O2 dependency presents today for decreased appetite.  Patient reports that her roommate called EMS to bring her to the emergency department for her to be evaluated today due to the decreased appetite.  Patient reports that she did not want to come to the ER as she feels she did not need to but her friend was concerned so she wanted EMS for evaluation.  Patient reports that she has had decreased appetite for multiple months and this is not a new problem for her she reports that she is still eating and drinking and making stool without difficulty she is just not as hungry as she used to be.  Patient reports that she had some [employer] pancakes for breakfast this morning and had a normal bowel movement this morning.  She denies any history of fever/chills, pain, nausea/vomiting, diarrhea, blood in the stool, dysuria/hematuria or any concerns at this time.  Patient states that she is feeling well and would like to go home after blood work.     HPI  Past Medical History:  Diagnosis Date   Acute duodenal ulcer with hemorrhage and perforation, with obstruction ([place]) 06/26/2004   multiple pyloric perferations   Allergy    Anemia    Anxiety    sees [name] NP at Dr. Radonna Ricker office   Asthma    hx of    Barrett's esophagus    Chronic abdominal pain    narcotic dependence, dr gyarteng-dak at [facility]   Chronic duodenal ulcer with gastric outlet obstruction 02/23/6383   Complication of anesthesia    pt states had too much xanax on board - agitated    COPD (chronic obstructive pulmonary disease) ([place])    Depression    Exertional shortness of breath    Fall 11/23/2014   frequent falls   Fx of fibula 07-28-11   left fibula, 2 places    Fx two ribs-open 08-24-11   left 4th and 5th   GASTRIC OUTLET OBSTRUCTION 08/28/2007   In July 2008 she underwent laparoscopic enterolysis, Nissen fundoplication over a #66 bougie, single pledgeted suture colosure of the hiatus and a 3 suture wrap.  Lap truncal vagotomy and a loop gastrojejunostomy was performed.  This was revised in August of 2009 to a roux en Y gastrojejujostomy.      Gastroparesis 06/03/2007   Qualifier: Diagnosis of  By: [name] MD, [name]    GERD (gastroesophageal reflux disease)    History of blood transfusion    "related to OR; maybe when I perforated that ulcer"   History of hiatal hernia    Lap Nissen + truncal vagotomy July 2008 05/13/2013   LEG EDEMA 01/19/2008   Qualifier: Diagnosis of  By: [name] MD, [name]    Macular degeneration, bilateral    early onset, severe, legally blind and worsening. Followed by dr. Katy Fitch   MENOPAUSE 01/07/2007   Qualifier: Diagnosis of  By: [name], CMA, Cindy     Narcotic abuse ([place])    Osteoporosis    severe   Oxygen deficiency    Oxygen dependent    3L/[place]    Peptic ulcer disease    dr Oletta Lamas   Pneumonia, organism [phone])  11/07/2010   Assoc with R Parapneumonic effusion 09/2010    - Tapped 10/05/10    - CxR resolved 11/07/2010     S/P jejunostomy Feb 2016 07/26/2014   Thyroid nodule 2013   benign after biopsy w/ Dr. Redmond Baseman    Patient Active Problem List   Diagnosis Date Noted   Abdominal pain, chronic, epigastric 05/31/2017   Episodic polysubstance dependence ([place]) 05/31/2017   At high risk for injury related to fall 05/18/2017   Chronic nausea 05/18/2017   Poor compliance with medication 11/20/2016   Insomnia 11/20/2016   Early onset macular degeneration 11/20/2016   Legally blind 11/20/2016   Abdominal pain    Hypoxia 04/08/2015   Opioid type dependence, continuous ([place])    Protein-calorie malnutrition ([place])     Episode of recurrent major depressive disorder ([place])    Secondary hyperparathyroidism ([place]) 01/10/2015   Vitamin D deficiency 01/10/2015   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   Anemia, iron deficiency 11/03/2014   Chronic pain syndrome 10/24/2014   Osteoporosis 10/05/2014   Vertebral compression fracture ([place]) 10/05/2014   Coronary artery disease due to calcified coronary lesion 10/05/2014   Weight loss    Tobacco abuse    History of subtotal gastrectomy with Roux-en-Y recontruction    Orthostatic hypotension 08/11/2014   Memory loss, short term 07/18/2014   Constipation, chronic 07/10/2013   Chronic abdominal pain    COPD (chronic obstructive pulmonary disease) ([place]) 11/07/2010   Major depressive disorder, recurrent episode, moderate ([place]) 06/06/2010   TARDIVE DYSKINESIA 11/17/2009   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 Melida Quitter    CATARACT EXTRACTION W/ INTRAOCULAR LENS  IMPLANT, BILATERAL Bilateral    CHOLECYSTECTOMY N/A 12/02/2017   Procedure: LAPAROSCOPIC CHOLECYSTECTOMY WITH INTRAOPERATIVE CHOLANGIOGRAM ERAS PATHWAY;  Surgeon: Johnathan Hausen, MD;  Location: [facility];  Service: General;  Laterality: N/A;   COLONOSCOPY     DILATION AND CURETTAGE OF UTERUS     ESOPHAGOGASTRODUODENOSCOPY  12-29-09   dr Elouise Munroe at [facility], several gastric ulcers   ESOPHAGOGASTRODUODENOSCOPY N/A 09/25/2012   Procedure: ESOPHAGOGASTRODUODENOSCOPY (EGD);  Surgeon: Beryle Beams, MD;  Location: [facility];  Service: Endoscopy;  Laterality: N/A;   ESOPHAGOGASTRODUODENOSCOPY (EGD) WITH PROPOFOL N/A 06/27/2017   Procedure: ESOPHAGOGASTRODUODENOSCOPY (EGD) WITH PROPOFOL;  Surgeon: Carol Ada, MD;  Location: [facility];  Service: Endoscopy;  Laterality: N/A;   FRACTURE SURGERY  Left 2010   hip and leg s/p fall   GASTROJEJUNOSTOMY  02/20/2008   Roux en Y gastrojejunsotomy w 1 foot Roux limb, laparotomy, takedown loop gastrojejunostomy with removal of Ntinol stent   GASTROSTOMY N/A 07/26/2014   Procedure: LAPRASCOPIC ASSISTED OPEN PLACEMENT OF JEJUNOSTOMY TUBE;  Surgeon: Pedro Earls, MD;  Location: [facility];  Service: General;  Laterality: N/A;   HERNIA REPAIR     "hiatal"   JOINT REPLACEMENT     LAPAROSCOPIC NISSEN [place]  [id number]   LAPAROSCOPIC PARTIAL GASTRECTOMY N/A 06/29/2013   Procedure: Gastrectomy and [place] Tube placement;  Surgeon: Pedro Earls, MD;  Location: [facility];  Service: General;  Laterality: N/A;   ORIF FEMORAL NECK FRACTURE W/ DHS Right 03/2014   ORIF HIP FRACTURE Left 2010   REPAIR OF PERFORATED ULCER     TONSILLECTOMY  1974   TRUNCAL VAGOTOMY  12/2006   Laparoscopic enterolysis, laparoscopic Nissen's Fundoplication, laparoscopic truncal  vagotomy & loop gastrojejunostomy     OB History   No obstetric history on file.      Home Medications    Prior to Admission medications   Medication Sig Start Date End Date Taking? Authorizing Provider  albuterol (PROVENTIL) (5 MG/ML) 0.5% nebulizer solution Take 1 mL (5 mg total) by nebulization every 6 (six) hours as needed for wheezing or shortness of breath. 08/05/18   Carlisle Cater, PA-C  ALPRAZolam (XANAX PO) Take 1 tablet by mouth at bedtime.    [provider]  Aspirin-Acetaminophen (GOODYS BODY PAIN PO) Take 1-2 packets by mouth every 8 (eight) hours as needed (pain).     [provider]  busPIRone (BUSPAR) 10 MG tablet Take 10 mg by mouth daily. 09/06/18   [provider]  metoCLOPramide (REGLAN) 5 MG tablet Take 1 tablet (5 mg total) by mouth 4 (four) times daily -  before meals and at bedtime. Patient not taking: Reported on 09/07/2018 08/18/18   Shawnee Knapp, MD  ondansetron (ZOFRAN-ODT) 4 MG disintegrating tablet Take 4 mg by mouth 2 (two) times daily  as needed for nausea/vomiting. 07/20/18   [provider]  oxyCODONE (OXY IR/ROXICODONE) 5 MG immediate release tablet Take 1 tablet (5 mg total) by mouth every 6 (six) hours as needed for severe pain. 12/02/17   Johnathan Hausen, MD  predniSONE (DELTASONE) 20 MG tablet Take 2 tablets (40 mg total) by mouth daily. Patient not taking: Reported on 09/07/2018 08/05/18   Carlisle Cater, PA-C  PROAIR HFA 108 [phone] Base) MCG/ACT inhaler Inhale 1 puff into the lungs every 4 (four) hours as needed for wheezing or shortness of breath.  08/06/18   [provider]  Vitamin D, Ergocalciferol, (DRISDOL) 1.25 MG (50000 UT) CAPS capsule Take 1 capsule (50,000 Units total) by mouth every 7 (seven) days. 06/01/18   Rutherford Guys, MD  zolpidem (AMBIEN) 5 MG tablet Take 5 mg by mouth at bedtime. 07/20/18   [provider]    Family History Family History  Problem Relation Age of Onset   Cancer Mother        kidney/bladder cancer, magliant skin cancer on face, cervical cancer   Stroke Mother    Hypertension Mother    Celiac disease Father    Cancer Father        metastatic renal cell carcinoma, pancreatic cancer, colon polyps   Colon cancer Maternal Uncle    Breast cancer Neg Hx    Ovarian cancer Neg Hx    Endometrial cancer Neg Hx     Social History Social History   Tobacco Use   Smoking status: Current Every Day Smoker    Packs/day: 1.00    Years: 51.00    Pack years: 51.00    Types: Cigarettes   Smokeless tobacco: Never Used  Substance Use Topics   Alcohol use: No    Alcohol/week: 0.0 standard drinks   Drug use: No     Allergies   Lithium, Celebrex [celecoxib], Morphine, Quetiapine, and Varenicline tartrate   Review of Systems Review of Systems  Constitutional: Positive for appetite change (Decreased times many months). Negative for chills, fatigue and fever.  Respiratory: Negative.  Negative for shortness of breath.   Cardiovascular: Negative.   Negative for chest pain.  Gastrointestinal: Negative.  Negative for abdominal distention, abdominal pain, blood in stool, constipation, diarrhea, nausea and vomiting.  Genitourinary: Negative.  Negative for dysuria and hematuria.  Musculoskeletal: Negative.  Negative for arthralgias and myalgias.  Neurological: Negative.  Negative for weakness and headaches.  All other systems reviewed and are negative.  Physical Exam Updated Vital Signs BP (!) 159/85    Pulse 76    Temp 99.1 F (37.3 C) (Oral)    Resp 16    Ht 5\' 3"  (1.6 m)    Wt 39.5 kg    SpO2 100%    BMI 15.41 kg/m   Physical Exam Constitutional:      General: She is not in acute distress.    Appearance: Normal appearance. She is well-developed. She is not ill-appearing or diaphoretic.     Comments: 2 L nasal O2, 100% saturation.  HENT:     Head: Normocephalic and atraumatic.     Right Ear: External ear normal.     Left Ear: External ear normal.     Nose: Nose normal.     Mouth/Throat:     Mouth: Mucous membranes are moist.     Pharynx: Oropharynx is clear.  Eyes:     General: Vision grossly intact. Gaze aligned appropriately.     Pupils: Pupils are equal, round, and reactive to light.  Neck:     Musculoskeletal: Normal range of motion.     Trachea: Trachea and phonation normal. No tracheal deviation.  Cardiovascular:     Rate and Rhythm: Normal rate and regular rhythm.     Pulses: Normal pulses.  Pulmonary:     Effort: Pulmonary effort is normal. No accessory muscle usage or respiratory distress.     Breath sounds: Normal breath sounds and air entry. No wheezing.  Abdominal:     General: There is no distension.     Palpations: Abdomen is soft.     Tenderness: There is no abdominal tenderness. There is no guarding or rebound.  Musculoskeletal: Normal range of motion.  Skin:    General: Skin is warm and dry.  Neurological:     Mental Status: She is alert.     GCS: GCS eye subscore is 4. GCS verbal subscore is 5. GCS  motor subscore is 6.     Comments: Speech is clear and goal oriented, follows commands Major Cranial nerves without deficit, no facial droop Moves extremities without ataxia, coordination intact  Psychiatric:        Behavior: Behavior normal.      ED Treatments / Results  Labs (all labs ordered are listed, but only abnormal results are displayed) Labs Reviewed  COMPREHENSIVE METABOLIC PANEL - Abnormal; Notable for the following components:      Result Value   Chloride 96 (*)    Glucose, Bld 130 (*)    Calcium 8.8 (*)    Total Protein 6.3 (*)    All other components within normal limits  LIPASE, BLOOD  CBC WITH DIFFERENTIAL/PLATELET    EKG None  Radiology No results found.  Procedures Procedures (including critical care time)  Medications Ordered in ED Medications  sodium chloride 0.9 % bolus 250 mL (0 mLs Intravenous Stopped 12/15/18 1250)     Initial Impression / Assessment and Plan / ED Course  I have reviewed the triage vital signs and the nursing notes.  Pertinent labs & imaging results that were available during my care of the patient were reviewed by me and considered in my medical decision making (see chart for details).    68 year old female with multiple chronic medical conditions presenting today for some decreased appetite over the past few months she had breakfast this morning, pancakes and a normal bowel  movement she has no abdominal pain or distention, no history of infectious like symptoms, physical examination today is reassuring heart regular rate and rhythm, lungs clear to auscultation, abdomen soft nontender without peritoneal signs, normal bowel sounds moving all extremities without difficulty and vital signs are stable on her normal 2 L nasal cannula.  She has no complaints and states that she is only here because her roommate sent her in for not eating enough food.  CBC within normal limits Lipase within normal limits CMP nonacute  With  reassuring lab work and vitals today there is no indication for further work-up, patient is eating and drinking without difficulty I have encouraged her to follow-up with her primary care provider.  Urinalysis was discontinued as she is not having any urinary symptoms, discussed with Dr. Regenia Skeeter who agrees.  At this time there does not appear to be any evidence of an acute emergency medical condition and the patient appears stable for discharge with appropriate outpatient follow up. Diagnosis was discussed with patient who verbalizes understanding of care plan and is agreeable to discharge. I have discussed return precautions with patient who verbalizes understanding of return precautions. Patient encouraged to follow-up with their PCP. All questions answered. Patient has been discharged in good condition.  Patient seen and examined by Dr. Regenia Skeeter during this visit who agrees with discharge and PCP follow-up.  Note: Portions of this report may have been transcribed using voice recognition software. Every effort was made to ensure accuracy; however, inadvertent computerized transcription errors may still be present. Final Clinical Impressions(s) / ED Diagnoses   Final diagnoses:  Concern about disease without diagnosis    ED Discharge Orders    None       Gari Crown 12/15/18 1350    Sherwood Gambler, MD 12/15/18 1415

## 2019-01-06 ENCOUNTER — Institutional Professional Consult (permissible substitution): Payer: Self-pay | Admitting: Internal Medicine

## 2019-01-12 ENCOUNTER — Inpatient Hospital Stay: Admission: RE | Admit: 2019-01-12 | Payer: Medicare Other | Source: Ambulatory Visit

## 2019-01-19 ENCOUNTER — Encounter

## 2019-01-19 ENCOUNTER — Ambulatory Visit: Payer: Medicare Other | Admitting: Family Medicine

## 2019-03-08 ENCOUNTER — Institutional Professional Consult (permissible substitution): Payer: Medicare Other | Admitting: Internal Medicine

## 2019-03-13 ENCOUNTER — Emergency Department (HOSPITAL_COMMUNITY): Payer: Medicare Other

## 2019-03-13 ENCOUNTER — Encounter (HOSPITAL_COMMUNITY): Payer: Self-pay

## 2019-03-13 ENCOUNTER — Emergency Department (HOSPITAL_COMMUNITY)
Admission: EM | Admit: 2019-03-13 | Discharge: 2019-03-13 | Discharge status: Against Medical Advice | Payer: Medicare Other | Attending: Emergency Medicine | Admitting: Emergency Medicine

## 2019-03-13 ENCOUNTER — Other Ambulatory Visit: Payer: Self-pay

## 2019-03-13 DIAGNOSIS — J449 Chronic obstructive pulmonary disease, unspecified: Secondary | ICD-10-CM | POA: Insufficient documentation

## 2019-03-13 DIAGNOSIS — I2510 Atherosclerotic heart disease of native coronary artery without angina pectoris: Secondary | ICD-10-CM | POA: Diagnosis not present

## 2019-03-13 DIAGNOSIS — M25551 Pain in right hip: Secondary | ICD-10-CM | POA: Diagnosis not present

## 2019-03-13 DIAGNOSIS — F17210 Nicotine dependence, cigarettes, uncomplicated: Secondary | ICD-10-CM | POA: Insufficient documentation

## 2019-03-13 DIAGNOSIS — W010XXA Fall on same level from slipping, tripping and stumbling without subsequent striking against object, initial encounter: Secondary | ICD-10-CM | POA: Insufficient documentation

## 2019-03-13 NOTE — Discharge Instructions (Addendum)
You could have a hip fracture that did not show up on the plain x-rays.  If you would like to return at any point for further evaluation of this your more than welcome to do so.

## 2019-03-13 NOTE — ED Notes (Signed)
Patient left before discharge process could be completed.  

## 2019-03-13 NOTE — ED Provider Notes (Signed)
[facility] DEPT Provider Note   CSN: [id number] Arrival date & time: 03/13/19  1450     History   Chief Complaint Chief Complaint  Patient presents with   Hip Pain    HPI [name] is a 68 y.o. female.     HPI Patient presents to the emergency department with right hip pain that started after a fall yesterday.  The patient states she was getting out of the car when she fell landing on her right side.  The patient states that she did not hit her head or any other area of her body.  Patient states that nothing seems make her condition better but certain movements palpation make the pain worse.  The patient states she can ambulate but it is fairly difficult.  Patient states she did not have any presyncopal symptoms prior to the fall.  The patient denies chest pain, shortness of breath, headache,blurred vision, neck pain, fever, cough, weakness, numbness, dizziness, anorexia, edema, abdominal pain, nausea, vomiting, diarrhea, rash, back pain, dysuria, hematemesis, bloody stool, near syncope, or syncope. Past Medical History:  Diagnosis Date   Acute duodenal ulcer with hemorrhage and perforation, with obstruction ([place]) 06/26/2004   multiple pyloric perferations   Allergy    Anemia    Anxiety    sees [name] NP at Dr. Radonna Ricker office   Asthma    hx of    Barrett's esophagus    Chronic abdominal pain    narcotic dependence, dr gyarteng-dak at [facility]   Chronic duodenal ulcer with gastric outlet obstruction [id number]   Complication of anesthesia    pt states had too much xanax on board - agitated    COPD (chronic obstructive pulmonary disease) ([place])    Depression    Exertional shortness of breath    Fall 11/23/2014   frequent falls   Fx of fibula 07-28-11   left fibula, 2 places    Fx two ribs-open 08-24-11   left 4th and 5th   GASTRIC OUTLET OBSTRUCTION 08/28/2007   In July 2008 she underwent laparoscopic  enterolysis, Nissen fundoplication over a [id number] bougie, single pledgeted suture colosure of the hiatus and a 3 suture wrap.  Lap truncal vagotomy and a loop gastrojejunostomy was performed.  This was revised in August of 2009 to a roux en Y gastrojejujostomy.      Gastroparesis 06/03/2007   Qualifier: Diagnosis of  By: [name] MD, [name]    GERD (gastroesophageal reflux disease)    History of blood transfusion    "related to OR; maybe when I perforated that ulcer"   History of hiatal hernia    Lap Nissen + truncal vagotomy July 2008 05/13/2013   LEG EDEMA 01/19/2008   Qualifier: Diagnosis of  By: [name] MD, [name]    Macular degeneration, bilateral    early onset, severe, legally blind and worsening. Followed by dr. Katy Fitch   MENOPAUSE 01/07/2007   Qualifier: Diagnosis of  By: [name] [place], [place]     Narcotic abuse ([place])    Osteoporosis    severe   Oxygen deficiency    Oxygen dependent    3L/[place]    Peptic ulcer disease    dr Oletta Lamas   Pneumonia, organism unspecified(486) 11/07/2010   Assoc with R Parapneumonic effusion 09/2010    - Tapped 10/05/10    - CxR resolved 11/07/2010     S/P jejunostomy Feb 2016 07/26/2014   Thyroid nodule 2013   benign after biopsy w/  Dr. 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/20/2016   Abdominal pain    Hypoxia 04/08/2015   Opioid type dependence, continuous ([place])    Protein-calorie malnutrition ([place])    Episode of recurrent major depressive disorder ([place])    Secondary hyperparathyroidism ([place]) 01/10/2015   Vitamin D deficiency 01/10/2015   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    Anemia, iron deficiency 11/03/2014   Chronic pain syndrome 10/24/2014   Osteoporosis 10/05/2014   Vertebral compression fracture ([place]) 10/05/2014   Coronary artery disease due to calcified coronary lesion 10/05/2014   Weight loss    Tobacco abuse    History of subtotal gastrectomy with Roux-en-Y recontruction    Orthostatic hypotension 08/11/2014   Memory loss, short term 07/18/2014   Constipation, chronic 07/10/2013   Chronic abdominal pain    COPD (chronic obstructive pulmonary disease) ([place]) 11/07/2010   Major depressive disorder, recurrent episode, moderate ([place]) 06/06/2010   TARDIVE DYSKINESIA 11/17/2009   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 Melida Quitter    CATARACT EXTRACTION W/ INTRAOCULAR LENS  IMPLANT, BILATERAL Bilateral    CHOLECYSTECTOMY N/A 12/02/2017   Procedure: LAPAROSCOPIC CHOLECYSTECTOMY WITH INTRAOPERATIVE CHOLANGIOGRAM ERAS PATHWAY;  Surgeon: Johnathan Hausen, MD;  Location: [facility];  Service: General;  Laterality: N/A;   COLONOSCOPY     DILATION AND CURETTAGE OF UTERUS     ESOPHAGOGASTRODUODENOSCOPY  12-29-09   dr Elouise Munroe at [facility], several gastric ulcers   ESOPHAGOGASTRODUODENOSCOPY N/A 09/25/2012   Procedure: ESOPHAGOGASTRODUODENOSCOPY (EGD);  Surgeon: Beryle Beams, MD;  Location: [facility];  Service: Endoscopy;  Laterality: N/A;   ESOPHAGOGASTRODUODENOSCOPY (EGD) WITH PROPOFOL N/A 06/27/2017   Procedure: ESOPHAGOGASTRODUODENOSCOPY (EGD) WITH PROPOFOL;  Surgeon: Carol Ada, MD;  Location: [facility];  Service: Endoscopy;  Laterality: N/A;   FRACTURE SURGERY Left 2010   hip and leg s/p fall   GASTROJEJUNOSTOMY  02/20/2008   Roux en Y gastrojejunsotomy w 1 foot Roux limb, laparotomy, takedown loop gastrojejunostomy with removal of Ntinol stent    GASTROSTOMY N/A 07/26/2014   Procedure: LAPRASCOPIC ASSISTED OPEN PLACEMENT OF JEJUNOSTOMY TUBE;  Surgeon: Pedro Earls, MD;  Location: [facility];  Service: General;  Laterality: N/A;   HERNIA REPAIR     "hiatal"   JOINT REPLACEMENT     LAPAROSCOPIC NISSEN Laura  [id number]   LAPAROSCOPIC PARTIAL GASTRECTOMY N/A 06/29/2013   Procedure: Gastrectomy and Elizabeth Tube placement;  Surgeon: Pedro Earls, MD;  Location: [facility];  Service: General;  Laterality: N/A;   ORIF FEMORAL NECK FRACTURE W/ DHS Right 03/2014   ORIF HIP FRACTURE Left 2010   REPAIR OF PERFORATED ULCER     TONSILLECTOMY  1974   TRUNCAL VAGOTOMY  12/2006   Laparoscopic enterolysis, laparoscopic Nissen's Fundoplication, laparoscopic truncal vagotomy & loop gastrojejunostomy     OB History   No obstetric history on file.      Home Medications    Prior to Admission medications   Medication Sig Start Date End Date Taking? Authorizing Provider  albuterol (PROVENTIL) (5 MG/ML) 0.5% nebulizer solution Take 1 mL (5 mg total) by nebulization every 6 (six) hours as needed for wheezing or shortness of breath. 08/05/18   Carlisle Cater, PA-C  ALPRAZolam (XANAX PO) Take 1 tablet by mouth at bedtime.    [provider]  Aspirin-Acetaminophen (GOODYS BODY PAIN PO) Take 1-2 packets by mouth every 8 (eight) hours as needed (pain).     [provider]  busPIRone (BUSPAR) 10 MG tablet Take 10 mg by mouth daily. 09/06/18   [provider]  metoCLOPramide (REGLAN) 5 MG tablet Take 1 tablet (5 mg total) by mouth 4 (four) times daily -  before meals and at bedtime. Patient not taking: Reported on 09/07/2018 08/18/18   Shawnee Knapp, MD  ondansetron (ZOFRAN-ODT) 4 MG disintegrating tablet Take 4 mg by mouth 2 (two) times daily as needed for nausea/vomiting. 07/20/18   [provider]  oxyCODONE (OXY IR/ROXICODONE) 5 MG immediate release tablet Take 1 tablet (5 mg total) by mouth every 6 (six) hours as needed  for severe pain. 12/02/17   Johnathan Hausen, MD  predniSONE (DELTASONE) 20 MG tablet Take 2 tablets (40 mg total) by mouth daily. Patient not taking: Reported on 09/07/2018 08/05/18   Carlisle Cater, PA-C  PROAIR HFA 108 [phone] Base) MCG/ACT inhaler Inhale 1 puff into the lungs every 4 (four) hours as needed for wheezing or shortness of breath.  08/06/18   [provider]  Vitamin D, Ergocalciferol, (DRISDOL) 1.25 MG (50000 UT) CAPS capsule Take 1 capsule (50,000 Units total) by mouth every 7 (seven) days. 06/01/18   Rutherford Guys, MD  zolpidem (AMBIEN) 5 MG tablet Take 5 mg by mouth at bedtime. 07/20/18   [provider]    Family History Family History  Problem Relation Age of Onset   Cancer Mother        kidney/bladder cancer, magliant skin cancer on face, cervical cancer   Stroke Mother    Hypertension Mother    Celiac disease Father    Cancer Father        metastatic renal cell carcinoma, pancreatic cancer, colon polyps   Colon cancer Maternal Uncle    Breast cancer Neg Hx    Ovarian cancer Neg Hx    Endometrial cancer Neg Hx     Social History Social History   Tobacco Use   Smoking status: Current Every Day Smoker    Packs/day: 1.00    Years: 51.00    Pack years: 51.00    Types: Cigarettes   Smokeless tobacco: Never Used  Substance Use Topics   Alcohol use: No    Alcohol/week: 0.0 standard drinks   Drug use: No     Allergies   Lithium, Celebrex [celecoxib], Morphine, Quetiapine, and Varenicline tartrate   Review of Systems Review of Systems All other systems negative except as documented in the HPI. All pertinent positives and negatives as reviewed in the HPI.  Physical Exam Updated Vital Signs BP (!) 144/77    Pulse 74    Temp 99.1 F (37.3 C) (Oral)    Resp 16    Wt 39 kg    SpO2 96%    BMI 15.23 kg/m   Physical Exam Vitals signs and nursing note reviewed.  Constitutional:      General: She is not in acute distress.     Appearance: She is well-developed.  HENT:     Head: Normocephalic and atraumatic.  Eyes:     Pupils: Pupils  are equal, round, and reactive to light.  Pulmonary:     Effort: Pulmonary effort is normal.  Musculoskeletal:     Right hip: She exhibits decreased range of motion, tenderness and bony tenderness. She exhibits normal strength, no swelling and no deformity.  Skin:    General: Skin is warm and dry.     Capillary Refill: Capillary refill takes less than 2 seconds.  Neurological:     Mental Status: She is alert and oriented to person, place, and time.     Motor: No weakness.     Coordination: Coordination normal.      ED Treatments / Results  Labs (all labs ordered are listed, but only abnormal results are displayed) Labs Reviewed - No data to display  EKG None  Radiology Dg Hip Unilat  With Pelvis 2-3 Views Right  Result Date: 03/13/2019 CLINICAL DATA:  Fall, right hip pain EXAM: DG HIP (WITH OR WITHOUT PELVIS) 2-3V RIGHT COMPARISON:  None. FINDINGS: Osteopenia. Status post screw fixation of the right femoral neck. There is no acute fracture or dislocation. The joint spaces are well preserved. Dynamic screw fixation of the left hip. No displaced fracture or dislocation of the pelvis in single frontal view. Nonobstructive pattern of overlying bowel gas. IMPRESSION: Osteopenia.Status post screw fixation of the right femoral neck. There is no acute fracture or dislocation. The joint spaces are well preserved. Dynamic screw fixation of the left hip. No displaced fracture or dislocation of the pelvis in single frontal view. Please note that plain radiographs are significantly insensitive for hip and pelvic fracture in the setting of osteopenia. Recommend CT or MRI to most sensitively assess for fracture if suspected. Electronically Signed   By: Eddie Candle M.D.   On: 03/13/2019 15:35    Procedures Procedures (including critical care time)  Medications Ordered in ED Medications -  No data to display   Initial Impression / Assessment and Plan / ED Course  I have reviewed the triage vital signs and the nursing notes.  Pertinent labs & imaging results that were available during my care of the patient were reviewed by me and considered in my medical decision making (see chart for details).        The patient was advised that she probably needs a CT scan due to the fact that she is having a fair amount of pain there is negative imaging.  I advised the patient that this will be the best course of action.  Patient states that she does not want to stay any longer and she refused to have a CT scan done.  Advised her that the results of the x-rays do not mean she does not have a fracture and the patient still does not want to stay.  I gave the patient another explanation of why we need her to stay and she still refuses.  Patient is advised to return here as needed.  I advised her that she will be leaving [facility] that she could have a fracture that did not show on the plain films patient voices an understanding of this.   Final Clinical Impressions(s) / ED Diagnoses   Final diagnoses:  None    ED Discharge Orders    None       Dalia Heading, PA-C 03/13/19 [place], [place], DO 03/18/19 1601

## 2019-03-13 NOTE — ED Triage Notes (Signed)
Pt BIBA from home. Pt had mechanical fall at 0900. Pt was ambulatory after fall. Pt c/o pain to right hip, no shortening/rotation. Pt took hydrocodone at 1415.   VSS with EMS.

## 2019-03-13 NOTE — ED Notes (Signed)
Patient threatened to leave. PA made aware.

## 2019-04-09 ENCOUNTER — Institutional Professional Consult (permissible substitution): Payer: Medicare Other | Admitting: Internal Medicine

## 2019-04-23 ENCOUNTER — Telehealth: Payer: Self-pay

## 2019-04-23 NOTE — Telephone Encounter (Signed)
Copied from [facility] [id number]. Topic: Appointment Scheduling - Scheduling Inquiry for Clinic >> Apr 23, 2019  4:26 PM Erick Blinks wrote: Reason for CRM: Pt called requesting to establish with Dr. Randel Pigg, pt has been referred by already established pt [name]) and is requesting to be considered. Please advise  Best contact: [phone]

## 2019-04-26 NOTE — Telephone Encounter (Signed)
LM to notify Dr. Charlett Blake is not accepting new pts. We are able to set up with Dr. Nani Ravens, Percell Miller, or [facility] if pt would like.

## 2019-04-26 NOTE — Telephone Encounter (Signed)
[name] can you let patient know  Unable to accept new patients at this time

## 2019-04-30 IMAGING — CR CHEST - 2 VIEW
2 series · 2 of 2 positions shown · non-contrast
Comparison: 08/05/2018

CLINICAL DATA: Cough, muscle weakness.

EXAM:
CHEST - 2 VIEW

[w chest pa]
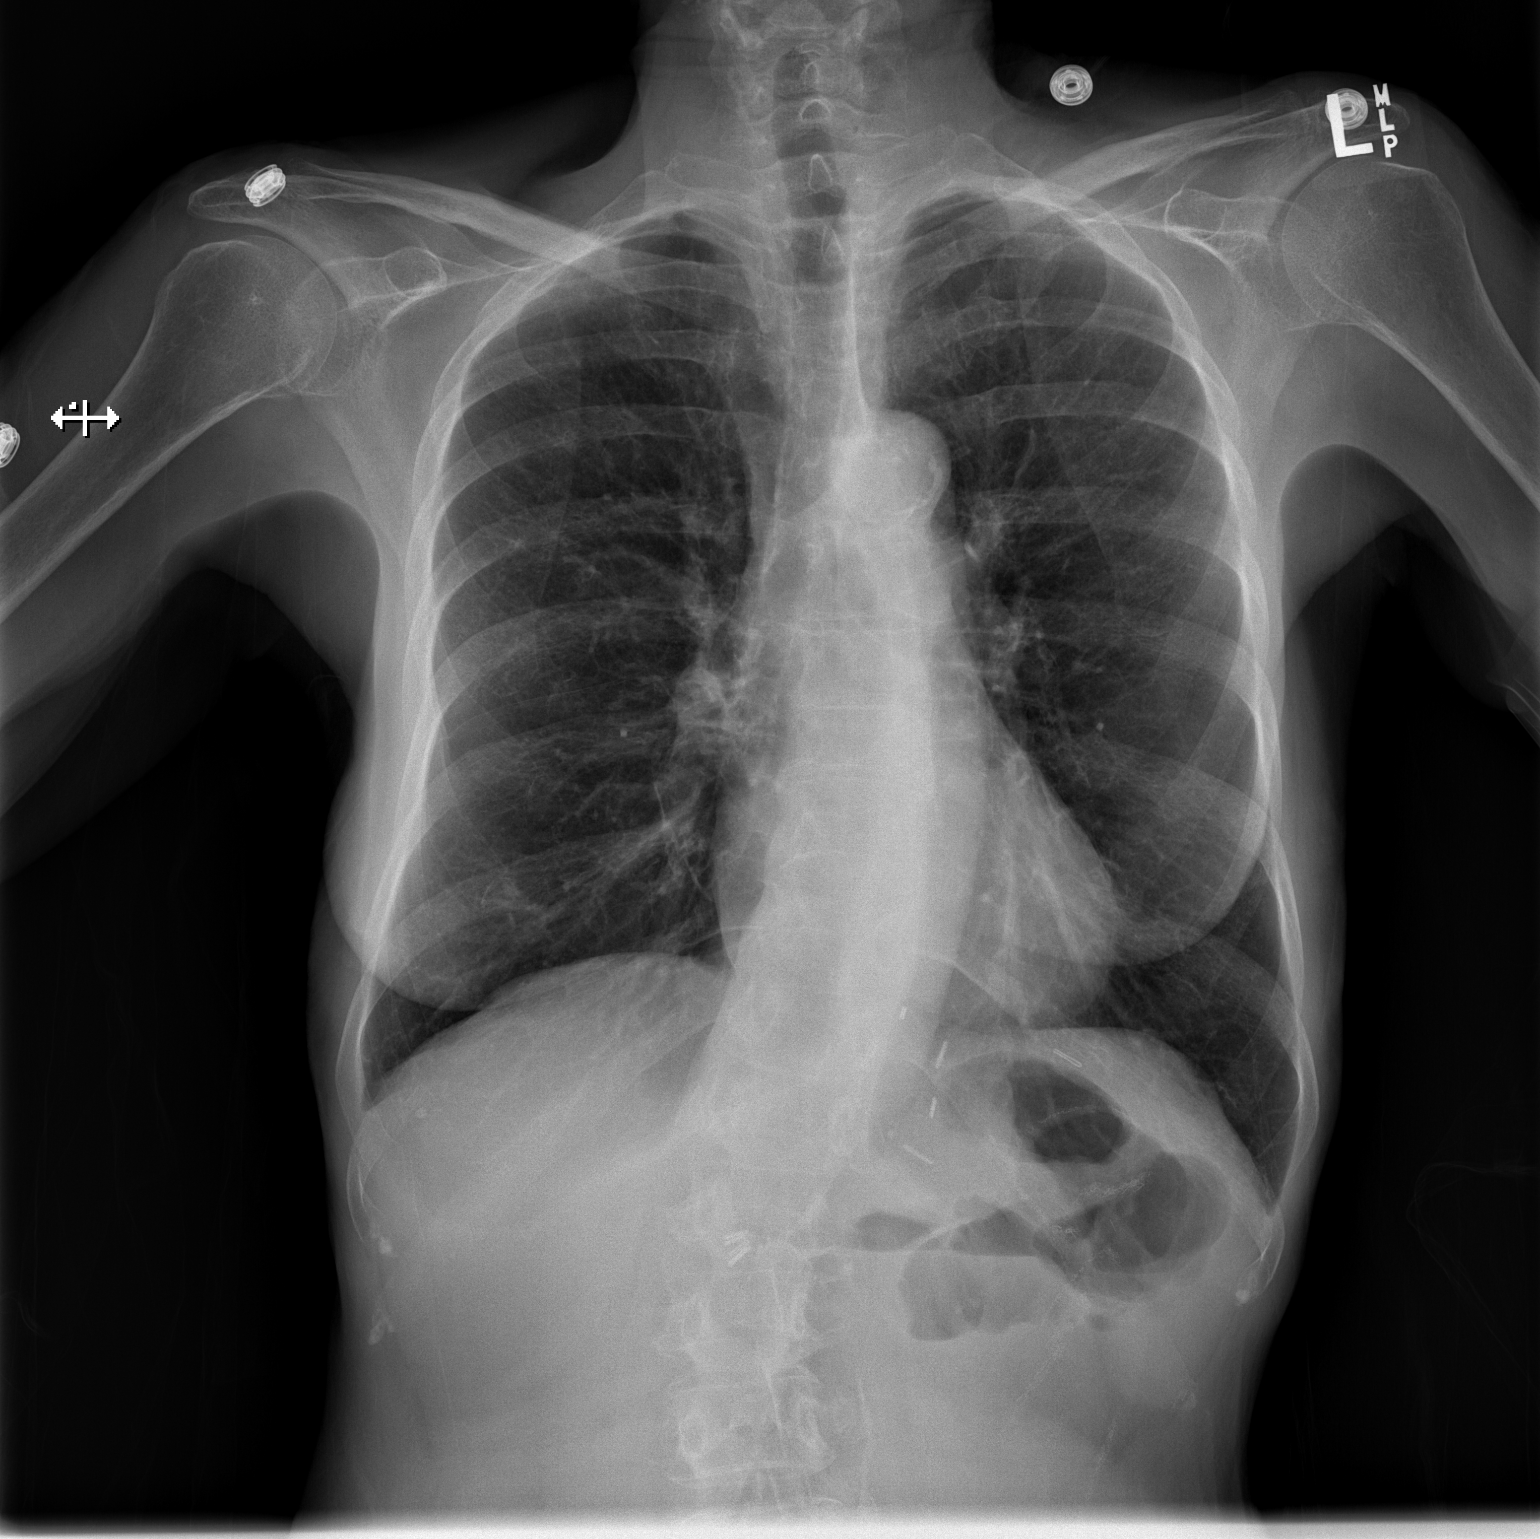

[w chest lat]
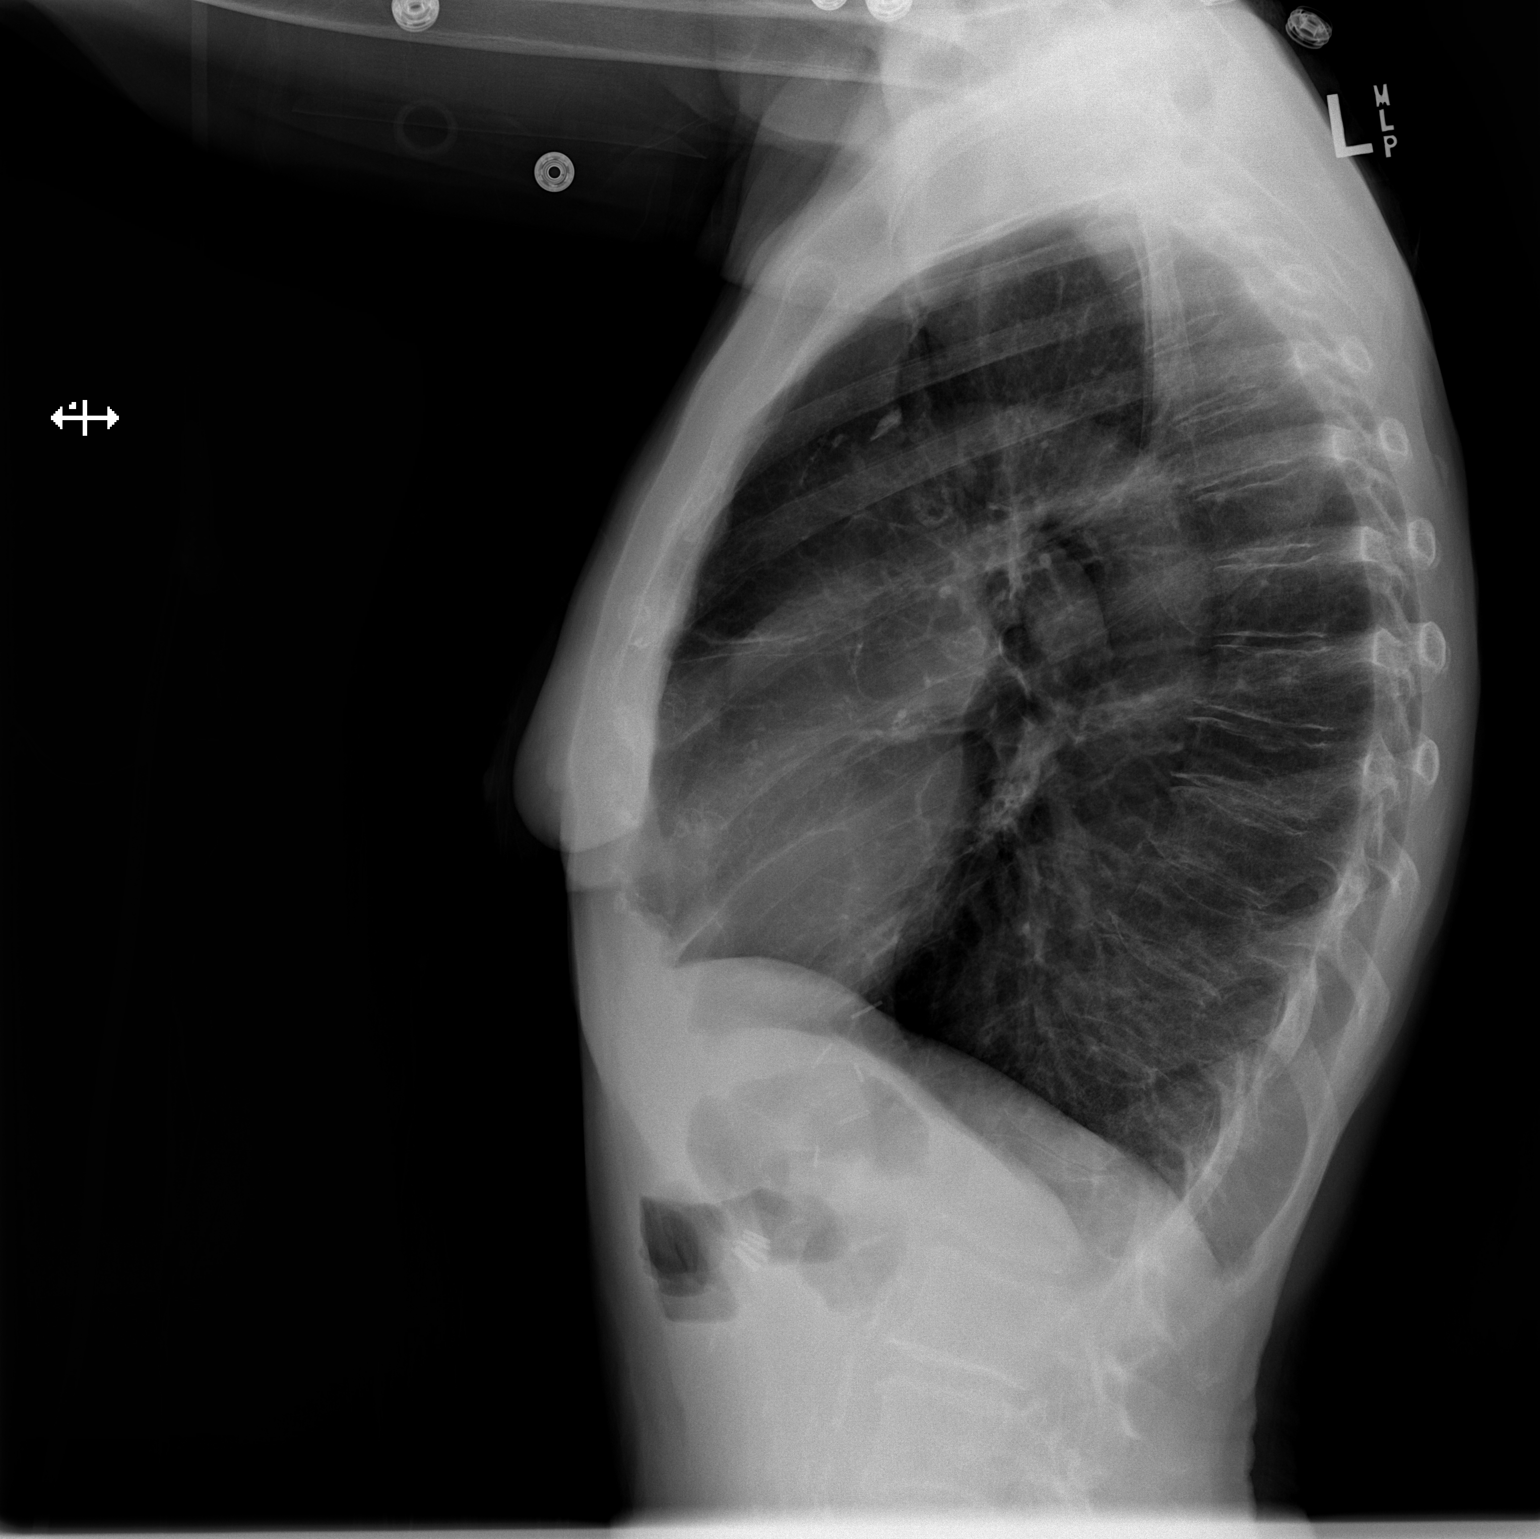

[2 of 2 positions shown; findings below may reference images not displayed]

FINDINGS: Heart and mediastinal contours are within normal limits. No focal
opacities or effusions. No acute bony abnormality. Thoracolumbar
scoliosis.
IMPRESSION: No active cardiopulmonary disease.

## 2019-05-10 ENCOUNTER — Institutional Professional Consult (permissible substitution): Payer: Medicare Other | Admitting: Internal Medicine

## 2019-05-11 ENCOUNTER — Ambulatory Visit: Payer: Medicare Other | Admitting: Physician Assistant

## 2019-05-12 ENCOUNTER — Emergency Department (HOSPITAL_COMMUNITY)
Admission: EM | Admit: 2019-05-12 | Discharge: 2019-05-12 | Disposition: A | Discharge status: Home / Self Care | Payer: Medicare Other | Attending: Emergency Medicine | Admitting: Emergency Medicine

## 2019-05-12 ENCOUNTER — Other Ambulatory Visit: Payer: Self-pay

## 2019-05-12 DIAGNOSIS — Z79899 Other long term (current) drug therapy: Secondary | ICD-10-CM | POA: Insufficient documentation

## 2019-05-12 DIAGNOSIS — F17210 Nicotine dependence, cigarettes, uncomplicated: Secondary | ICD-10-CM | POA: Insufficient documentation

## 2019-05-12 DIAGNOSIS — R2243 Localized swelling, mass and lump, lower limb, bilateral: Secondary | ICD-10-CM | POA: Diagnosis present

## 2019-05-12 DIAGNOSIS — J449 Chronic obstructive pulmonary disease, unspecified: Secondary | ICD-10-CM | POA: Insufficient documentation

## 2019-05-12 DIAGNOSIS — R609 Edema, unspecified: Secondary | ICD-10-CM | POA: Insufficient documentation

## 2019-05-12 DIAGNOSIS — M7989 Other specified soft tissue disorders: Secondary | ICD-10-CM

## 2019-05-12 LAB — CBC WITH DIFFERENTIAL/PLATELET
Abs Immature Granulocytes: 0.03 10*3/uL (ref 0.00–0.07)
Basophils Absolute: 0.1 10*3/uL (ref 0.0–0.1)
Basophils Relative: 2 %
Eosinophils Absolute: 0 10*3/uL (ref 0.0–0.5)
Eosinophils Relative: 1 %
HCT: 41.4 % (ref 36.0–46.0)
Hemoglobin: 13 g/dL (ref 12.0–15.0)
Immature Granulocytes: 1 %
Lymphocytes Relative: 19 %
Lymphs Abs: 1 10*3/uL (ref 0.7–4.0)
MCH: 30.5 pg (ref 26.0–34.0)
MCHC: 31.4 g/dL (ref 30.0–36.0)
MCV: 97.2 fL (ref 80.0–100.0)
Monocytes Absolute: 0.6 10*3/uL (ref 0.1–1.0)
Monocytes Relative: 11 %
Neutro Abs: 3.5 10*3/uL (ref 1.7–7.7)
Neutrophils Relative %: 66 %
Platelets: 380 10*3/uL (ref 150–400)
RBC: 4.26 MIL/uL (ref 3.87–5.11)
RDW: 15.5 % (ref 11.5–15.5)
WBC: 5.2 10*3/uL (ref 4.0–10.5)
nRBC: 0 % (ref 0.0–0.2)

## 2019-05-12 LAB — BASIC METABOLIC PANEL
Anion gap: 15 (ref 5–15)
BUN: 19 mg/dL (ref 8–23)
CO2: 24 mmol/L (ref 22–32)
Calcium: 8.5 mg/dL — ABNORMAL LOW (ref 8.9–10.3)
Chloride: 96 mmol/L — ABNORMAL LOW (ref 98–111)
Creatinine, Ser: 1.05 mg/dL — ABNORMAL HIGH (ref 0.44–1.00)
GFR calc Af Amer: >60 mL/min (ref >60)
GFR calc non Af Amer: 55 mL/min — ABNORMAL LOW (ref >60)
Glucose, Bld: 107 mg/dL — ABNORMAL HIGH (ref 70–99)
Potassium: 4.3 mmol/L (ref 3.5–5.1)
Sodium: 135 mmol/L (ref 135–145)

## 2019-05-12 NOTE — Discharge Instructions (Addendum)
Please return for any problem.  Follow-up with your regular care provider as instructed. °

## 2019-05-12 NOTE — ED Provider Notes (Signed)
[facility] DEPT Provider Note   CSN: [id number] Arrival date & time: 05/12/19  1837     History   Chief Complaint Chief Complaint  Patient presents with  . Foot Swelling    bilateral    HPI [name] is a 68 y.o. female.     68 year old female with prior medical history as detailed below presents for evaluation of lower extremity edema.  Patient reports mild edema to both feet.  This has been ongoing for at least the last 1 to 2 weeks.  She denies associated chest pain, shortness of breath, nausea, vomiting, fever, or other acute complaint.  She has not yet contacted her regular doctor about these problems.  Patient is ambulatory.  The history is provided by the patient and medical records.  Illness Location:  Edema to both feet Severity:  Mild Onset quality:  Gradual Duration:  3 weeks Timing:  Constant Progression:  Waxing and waning Chronicity:  New Associated symptoms: no fever     Past Medical History:  Diagnosis Date  . Acute duodenal ulcer with hemorrhage and perforation, with obstruction ([place]) 06/26/2004   multiple pyloric perferations  . Allergy   . Anemia   . Anxiety    sees [name] NP at Dr. Radonna Ricker office  . Asthma    hx of   . Barrett's esophagus   . Chronic abdominal pain    narcotic dependence, dr gyarteng-dak at [facility]  . 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 Complication of anesthesia    pt states had too much xanax on board - agitated   . COPD (chronic obstructive pulmonary disease) ([place])   . Depression   . Exertional shortness of breath   . Fall 11/23/2014   frequent falls  . Fx of fibula 07-28-11   left fibula, 2 places   . Fx two ribs-open 08-24-11   left 4th and 5th  . GASTRIC OUTLET OBSTRUCTION 08/28/2007   In July 2008 she underwent laparoscopic enterolysis, Nissen fundoplication over a [id number] bougie, single pledgeted suture colosure of the hiatus and a 3  suture wrap.  Lap truncal vagotomy and a loop gastrojejunostomy was performed.  This was revised in August of 2009 to a roux en Y gastrojejujostomy.     . Gastroparesis 06/03/2007   Qualifier: Diagnosis of  By: [name] MD, [name]   . GERD (gastroesophageal reflux disease)   . History of blood transfusion    "related to OR; maybe when I perforated that ulcer"  . History of hiatal hernia   . Lap Nissen + truncal vagotomy July 2008 05/13/2013  . LEG EDEMA 01/19/2008   Qualifier: Diagnosis of  By: [name] MD, [name]   . Macular degeneration, bilateral    early onset, severe, legally blind and worsening. Followed by dr. Katy Fitch  . MENOPAUSE 01/07/2007   Qualifier: Diagnosis of  By: [name], CMA, [place]    . Narcotic abuse ([place])   . Osteoporosis    severe  . Oxygen deficiency   . Oxygen dependent    3L/[place]   . Peptic ulcer disease    dr Oletta Lamas  . Pneumonia, organism unspecified(486) 11/07/2010   Assoc with R Parapneumonic effusion 09/2010    - Tapped 10/05/10    - CxR resolved 11/07/2010    . S/P jejunostomy Feb 2016 07/26/2014  . Thyroid nodule 2013   benign after biopsy w/ Dr. Redmond Baseman    Patient Active Problem List  Diagnosis Date Noted  . Abdominal pain, chronic, epigastric 05/31/2017  . Episodic polysubstance dependence ([place]) 05/31/2017  . At high risk for injury related to fall 05/18/2017  . Chronic nausea 05/18/2017  . Poor compliance with medication 11/20/2016  . Insomnia 11/20/2016  . Early onset macular degeneration 11/20/2016  . Legally blind 11/20/2016  . Abdominal pain   . Hypoxia 04/08/2015  . Opioid type dependence, continuous ([place])   . Protein-calorie malnutrition ([place])   . Episode of recurrent major depressive disorder ([place])   . Secondary hyperparathyroidism ([place]) 01/10/2015  . Vitamin D deficiency 01/10/2015  . 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 Anemia, iron deficiency 11/03/2014  . Chronic pain syndrome 10/24/2014  . Osteoporosis 10/05/2014  . Vertebral  compression fracture ([place]) 10/05/2014  . Coronary artery disease due to calcified coronary lesion 10/05/2014  . Weight loss   . Tobacco abuse   . History of subtotal gastrectomy with Roux-en-Y recontruction   . Orthostatic hypotension 08/11/2014  . Memory loss, short term 07/18/2014  . Constipation, chronic 07/10/2013  . Chronic abdominal pain   . COPD (chronic obstructive pulmonary disease) ([place]) 11/07/2010  . Major depressive disorder, recurrent episode, moderate ([place]) 06/06/2010  . 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 Anxiety state 01/07/2007  . Barrett's esophagus 07/03/2005    Past Surgical History:  Procedure Laterality Date  . BIOPSY THYROID  08-13-11   benign nodule, per Dr. Melida Quitter   . CATARACT EXTRACTION W/ INTRAOCULAR LENS  IMPLANT, BILATERAL Bilateral   . CHOLECYSTECTOMY N/A 12/02/2017   Procedure: LAPAROSCOPIC CHOLECYSTECTOMY WITH INTRAOPERATIVE CHOLANGIOGRAM ERAS PATHWAY;  Surgeon: Johnathan Hausen, MD;  Location: [facility];  Service: General;  Laterality: N/A;  . COLONOSCOPY    . DILATION AND CURETTAGE OF UTERUS    . ESOPHAGOGASTRODUODENOSCOPY  12-29-09   dr qadeer at [facility], several gastric ulcers  . ESOPHAGOGASTRODUODENOSCOPY N/A 09/25/2012   Procedure: ESOPHAGOGASTRODUODENOSCOPY (EGD);  Surgeon: Beryle Beams, MD;  Location: [facility];  Service: Endoscopy;  Laterality: N/A;  . ESOPHAGOGASTRODUODENOSCOPY (EGD) WITH PROPOFOL N/A 06/27/2017   Procedure: ESOPHAGOGASTRODUODENOSCOPY (EGD) WITH PROPOFOL;  Surgeon: Carol Ada, MD;  Location: [facility];  Service: Endoscopy;  Laterality: N/A;  . FRACTURE SURGERY Left 2010   hip and leg s/p fall  . GASTROJEJUNOSTOMY  02/20/2008   Roux en Y gastrojejunsotomy w 1 foot Roux limb, laparotomy, takedown loop gastrojejunostomy with removal of Ntinol stent  . GASTROSTOMY N/A 07/26/2014   Procedure: LAPRASCOPIC ASSISTED OPEN PLACEMENT OF JEJUNOSTOMY TUBE;  Surgeon: Pedro Earls, MD;  Location: [facility];  Service: General;  Laterality: N/A;  . HERNIA REPAIR     "hiatal"  . JOINT REPLACEMENT    . LAPAROSCOPIC NISSEN FUNDOPLICATION  [id number]  . LAPAROSCOPIC PARTIAL GASTRECTOMY N/A 06/29/2013   Procedure: Gastrectomy and GJ Tube placement;  Surgeon: Pedro Earls, MD;  Location: [facility];  Service: General;  Laterality: N/A;  . ORIF FEMORAL NECK FRACTURE W/ DHS Right 03/2014  . ORIF HIP FRACTURE Left 2010  . REPAIR OF PERFORATED ULCER    . TONSILLECTOMY  1974  . TRUNCAL VAGOTOMY  12/2006   Laparoscopic enterolysis, laparoscopic Nissen's Fundoplication, laparoscopic truncal vagotomy & loop gastrojejunostomy     OB History   No obstetric history on file.      Home Medications    Prior to Admission medications   Medication Sig Start Date End Date Taking? Authorizing Provider  albuterol (PROVENTIL) (5 MG/ML) 0.5% nebulizer solution Take 1 mL (  5 mg total) by nebulization every 6 (six) hours as needed for wheezing or shortness of breath. 08/05/18   Carlisle Cater, PA-C  ALPRAZolam (XANAX PO) Take 1 tablet by mouth at bedtime.    [provider]  Aspirin-Acetaminophen (GOODYS BODY PAIN PO) Take 1-2 packets by mouth every 8 (eight) hours as needed (pain).     [provider]  busPIRone (BUSPAR) 10 MG tablet Take 10 mg by mouth daily. 09/06/18   [provider]  metoCLOPramide (REGLAN) 5 MG tablet Take 1 tablet (5 mg total) by mouth 4 (four) times daily -  before meals and at bedtime. Patient not taking: Reported on 09/07/2018 08/18/18   Shawnee Knapp, MD  ondansetron (ZOFRAN-ODT) 4 MG disintegrating tablet Take 4 mg by mouth 2 (two) times daily as needed for nausea/vomiting. 07/20/18   [provider]  oxyCODONE (OXY IR/ROXICODONE) 5 MG immediate release tablet Take 1 tablet (5 mg total) by mouth every 6 (six) hours as needed for severe pain. 12/02/17   Johnathan Hausen, MD  predniSONE (DELTASONE) 20 MG tablet Take 2 tablets (40 mg total) by  mouth daily. Patient not taking: Reported on 09/07/2018 08/05/18   Carlisle Cater, PA-C  PROAIR HFA 108 [phone] Base) MCG/ACT inhaler Inhale 1 puff into the lungs every 4 (four) hours as needed for wheezing or shortness of breath.  08/06/18   [provider]  Vitamin D, Ergocalciferol, (DRISDOL) 1.25 MG (50000 UT) CAPS capsule Take 1 capsule (50,000 Units total) by mouth every 7 (seven) days. 06/01/18   Rutherford Guys, MD  zolpidem (AMBIEN) 5 MG tablet Take 5 mg by mouth at bedtime. 07/20/18   [provider]    Family History Family History  Problem Relation Age of Onset  . Cancer Mother        kidney/bladder cancer, magliant skin cancer on face, cervical cancer  . Stroke Mother   . Hypertension Mother   . Celiac disease Father   . Cancer Father        metastatic renal cell carcinoma, pancreatic cancer, colon polyps  . Colon cancer Maternal Uncle   . Breast cancer Neg Hx   . Ovarian cancer Neg Hx   . Endometrial cancer Neg Hx     Social History Social History   Tobacco Use  . Smoking status: Current Every Day Smoker    Packs/day: 1.00    Years: 51.00    Pack years: 51.00    Types: Cigarettes  . Smokeless tobacco: Never Used  Substance Use Topics  . Alcohol use: No    Alcohol/week: 0.0 standard drinks  . Drug use: No     Allergies   Lithium, Celebrex [celecoxib], Morphine, Quetiapine, and Varenicline tartrate   Review of Systems Review of Systems  Constitutional: Negative for fever.  All other systems reviewed and are negative.    Physical Exam Updated Vital Signs BP 118/66   Pulse 87   Temp 99.4 F (37.4 C) (Oral)   Resp 19   SpO2 100%   Physical Exam Vitals signs and nursing note reviewed.  Constitutional:      General: She is not in acute distress.    Appearance: She is well-developed.  HENT:     Head: Normocephalic and atraumatic.  Eyes:     Conjunctiva/sclera: Conjunctivae normal.     Pupils: Pupils are equal, round, and reactive to  light.  Neck:     Musculoskeletal: Normal range of motion and neck supple.  Cardiovascular:  Rate and Rhythm: Normal rate and regular rhythm.     Heart sounds: Normal heart sounds.  Pulmonary:     Effort: Pulmonary effort is normal. No respiratory distress.     Breath sounds: Normal breath sounds.  Abdominal:     General: There is no distension.     Palpations: Abdomen is soft.     Tenderness: There is no abdominal tenderness.  Musculoskeletal: Normal range of motion.        General: No deformity.     Comments: Mild edema to both feet.  Both lower extremities are neurovascular intact.  Patient with full active range of motion of both lower extremities.  Skin:    General: Skin is warm and dry.  Neurological:     Mental Status: She is alert and oriented to person, place, and time.      ED Treatments / Results  Labs (all labs ordered are listed, but only abnormal results are displayed) Labs Reviewed  BASIC METABOLIC PANEL - Abnormal; Notable for the following components:      Result Value   Chloride 96 (*)    Glucose, Bld 107 (*)    Creatinine, Ser 1.05 (*)    Calcium 8.5 (*)    GFR calc non Af Amer 55 (*)    All other components within normal limits  CBC WITH DIFFERENTIAL/PLATELET  URINALYSIS, ROUTINE W REFLEX MICROSCOPIC    EKG None  Radiology No results found.  Procedures Procedures (including critical care time)  Medications Ordered in ED Medications - No data to display   Initial Impression / Assessment and Plan / ED Course  I have reviewed the triage vital signs and the nursing notes.  Pertinent labs & imaging results that were available during my care of the patient were reviewed by me and considered in my medical decision making (see chart for details).       MDM  Screen complete  [name] was evaluated in Emergency Department on 05/12/2019 for the symptoms described in the history of present illness. She was evaluated in the context of  the global COVID-19 pandemic, which necessitated consideration that the patient might be at risk for infection with the SARS-CoV-2 virus that causes COVID-19. Institutional protocols and algorithms that pertain to the evaluation of patients at risk for COVID-19 are in a state of rapid change based on information released by regulatory bodies including the CDC and federal and state organizations. These policies and algorithms were followed during the patient's care in the ED.   Patient is presenting for evaluation of mild pedal edema.  This is bilateral.  She is otherwise without complaint.  Her complaint is chronic in nature.  Screening labs obtained in the ED are without significant abnormality.  Shortly after her labs resulted the patient reports that she was feeling better and now desires discharge.  Basic instructions on how to manage her edema at home were given.  She is strongly encouraged to follow-up with her regular care provider.  Strict return precautions given and understood.  Final Clinical Impressions(s) / ED Diagnoses   Final diagnoses:  Peripheral edema  Foot swelling    ED Discharge Orders    None       Valarie Merino, MD 05/12/19 1941

## 2019-05-12 NOTE — ED Triage Notes (Signed)
Pt BIBA from home.   Per EMS- EMS was called earlier in day, pt refused transport at that time.  Recalled out, d/t significant edema to bilateral feet x3 days.  Pt reports starting to take her prescribed lasix 3 days ago, no relief. C/o significant pain, tearful.  Pt takes hydrocodone 5x/day, last taken at 1700. Pt ambulatory with walker.

## 2019-06-08 ENCOUNTER — Ambulatory Visit (INDEPENDENT_AMBULATORY_CARE_PROVIDER_SITE_OTHER): Payer: Medicare Other

## 2019-06-08 ENCOUNTER — Encounter: Payer: Self-pay | Admitting: Registered Nurse

## 2019-06-08 ENCOUNTER — Telehealth: Payer: Self-pay | Admitting: Registered Nurse

## 2019-06-08 ENCOUNTER — Other Ambulatory Visit: Payer: Self-pay

## 2019-06-08 ENCOUNTER — Ambulatory Visit (INDEPENDENT_AMBULATORY_CARE_PROVIDER_SITE_OTHER): Payer: Medicare Other | Admitting: Registered Nurse

## 2019-06-08 VITALS — BP 119/76 | HR 87 | Temp 97.8°F | Resp 12 | Ht 63.5 in | Wt 94.8 lb

## 2019-06-08 DIAGNOSIS — R0602 Shortness of breath: Secondary | ICD-10-CM | POA: Diagnosis not present

## 2019-06-08 DIAGNOSIS — W19XXXA Unspecified fall, initial encounter: Secondary | ICD-10-CM

## 2019-06-08 MED ORDER — ACETAMINOPHEN 500 MG PO TABS: 500.0000 mg | ORAL_TABLET | Freq: Four times a day (QID) | ORAL | 0 refills | Status: AC | PRN

## 2019-06-08 NOTE — Telephone Encounter (Signed)
Patient calling to inquire if [name] would be willing to call her in Tramadol. She states she is currently seeing pain management but cannot take hydrocodone but so often. Patient requesting call back from CMA to discuss. Patient was seen today.

## 2019-06-08 NOTE — Progress Notes (Signed)
Acute Office Visit  Subjective:    Patient ID: [name], female    DOB: [birth year], 68 y.o.   MRN: [id number]  Chief Complaint  Patient presents with  . Injury    Pt stated ---fell down in the bathroom yestrday--Left side of the ribs painful and having sharp pain when breathing. Denied fever.    HPI Patient is in today for fall  Fell yesterday - does not remember the circumstances leading up to the fall or the process of the fall. Notes that this occurred in the bathroom and that she has rib pain on her L side since. Hurts to take deep breaths and to cough. No pain in shoulder or hip. Denies hitting her head, any headaches, confusion, and neuro symptoms. Baseline cough and shob d/t long hx of tobacco use, this has not been worsened since her fall. Biggest concern today is ensuring that she did not fracture a rib when she fell.   Past Medical History:  Diagnosis Date  . Acute duodenal ulcer with hemorrhage and perforation, with obstruction ([place]) 06/26/2004   multiple pyloric perferations  . Allergy   . Anemia   . Anxiety    sees [name] NP at Dr. Radonna Ricker office  . Asthma    hx of   . Barrett's esophagus   . Chronic abdominal pain    narcotic dependence, dr gyarteng-dak at [facility]  . 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 Complication of anesthesia    pt states had too much xanax on board - agitated   . COPD (chronic obstructive pulmonary disease) ([place])   . Depression   . Exertional shortness of breath   . Fall 11/23/2014   frequent falls  . Fx of fibula 07-28-11   left fibula, 2 places   . Fx two ribs-open 08-24-11   left 4th and 5th  . GASTRIC OUTLET OBSTRUCTION 08/28/2007   In July 2008 she underwent laparoscopic enterolysis, Nissen fundoplication over a [id number] bougie, single pledgeted suture colosure of the hiatus and a 3 suture wrap.  Lap truncal vagotomy and a loop gastrojejunostomy was performed.  This was revised in  August of 2009 to a roux en Y gastrojejujostomy.     . Gastroparesis 06/03/2007   Qualifier: Diagnosis of  By: [name] MD, [name]   . GERD (gastroesophageal reflux disease)   . History of blood transfusion    "related to OR; maybe when I perforated that ulcer"  . History of hiatal hernia   . Lap Nissen + truncal vagotomy July 2008 05/13/2013  . LEG EDEMA 01/19/2008   Qualifier: Diagnosis of  By: [name] MD, [name]   . Macular degeneration, bilateral    early onset, severe, legally blind and worsening. Followed by dr. Katy Fitch  . MENOPAUSE 01/07/2007   Qualifier: Diagnosis of  By: [name], CMA, [place]    . Narcotic abuse ([place])   . Osteoporosis    severe  . Oxygen deficiency   . Oxygen dependent    3L/[place]   . Peptic ulcer disease    dr Oletta Lamas  . Pneumonia, organism unspecified(486) 11/07/2010   Assoc with R Parapneumonic effusion 09/2010    - Tapped 10/05/10    - CxR resolved 11/07/2010    . S/P jejunostomy Feb 2016 07/26/2014  . Thyroid nodule 2013   benign after biopsy w/ Dr. Redmond Baseman    Past Surgical History:  Procedure Laterality Date  . BIOPSY THYROID  08-13-11  benign nodule, per Dr. Melida Quitter   . CATARACT EXTRACTION W/ INTRAOCULAR LENS  IMPLANT, BILATERAL Bilateral   . CHOLECYSTECTOMY N/A 12/02/2017   Procedure: LAPAROSCOPIC CHOLECYSTECTOMY WITH INTRAOPERATIVE CHOLANGIOGRAM ERAS PATHWAY;  Surgeon: Johnathan Hausen, MD;  Location: [facility];  Service: General;  Laterality: N/A;  . COLONOSCOPY    . DILATION AND CURETTAGE OF UTERUS    . ESOPHAGOGASTRODUODENOSCOPY  12-29-09   dr qadeer at [facility], several gastric ulcers  . ESOPHAGOGASTRODUODENOSCOPY N/A 09/25/2012   Procedure: ESOPHAGOGASTRODUODENOSCOPY (EGD);  Surgeon: Beryle Beams, MD;  Location: [facility];  Service: Endoscopy;  Laterality: N/A;  . ESOPHAGOGASTRODUODENOSCOPY (EGD) WITH PROPOFOL N/A 06/27/2017   Procedure: ESOPHAGOGASTRODUODENOSCOPY (EGD) WITH PROPOFOL;  Surgeon: Carol Ada, MD;  Location: [facility];  Service:  Endoscopy;  Laterality: N/A;  . FRACTURE SURGERY Left 2010   hip and leg s/p fall  . GASTROJEJUNOSTOMY  02/20/2008   Roux en Y gastrojejunsotomy w 1 foot Roux limb, laparotomy, takedown loop gastrojejunostomy with removal of Ntinol stent  . GASTROSTOMY N/A 07/26/2014   Procedure: LAPRASCOPIC ASSISTED OPEN PLACEMENT OF JEJUNOSTOMY TUBE;  Surgeon: Pedro Earls, MD;  Location: [facility];  Service: General;  Laterality: N/A;  . HERNIA REPAIR     "hiatal"  . JOINT REPLACEMENT    . LAPAROSCOPIC NISSEN FUNDOPLICATION  [id number]  . LAPAROSCOPIC PARTIAL GASTRECTOMY N/A 06/29/2013   Procedure: Gastrectomy and GJ Tube placement;  Surgeon: Pedro Earls, MD;  Location: [facility];  Service: General;  Laterality: N/A;  . ORIF FEMORAL NECK FRACTURE W/ DHS Right 03/2014  . ORIF HIP FRACTURE Left 2010  . REPAIR OF PERFORATED ULCER    . TONSILLECTOMY  1974  . TRUNCAL VAGOTOMY  12/2006   Laparoscopic enterolysis, laparoscopic Nissen's Fundoplication, laparoscopic truncal vagotomy & loop gastrojejunostomy    Family History  Problem Relation Age of Onset  . Cancer Mother        kidney/bladder cancer, magliant skin cancer on face, cervical cancer  . Stroke Mother   . Hypertension Mother   . Celiac disease Father   . Cancer Father        metastatic renal cell carcinoma, pancreatic cancer, colon polyps  . Colon cancer Maternal Uncle   . Breast cancer Neg Hx   . Ovarian cancer Neg Hx   . Endometrial cancer Neg Hx     Social History   Socioeconomic History  . Marital status: Divorced    Spouse name: Not on file  . Number of children: 0  . Years of education: Not on file  . Highest education level: Not on file  Occupational History  . Not on file  Tobacco Use  . Smoking status: Current Every Day Smoker    Packs/day: 1.00    Years: 51.00    Pack years: 51.00    Types: Cigarettes  . Smokeless tobacco: Never Used  Substance and Sexual Activity  . Alcohol use: No    Alcohol/week: 0.0 standard drinks   . Drug use: No  . Sexual activity: Never  Other Topics Concern  . Not on file  Social History Narrative  . Not on file   Social Determinants of Health   Financial Resource Strain:   . Difficulty of Paying Living Expenses: Not on file  Food Insecurity:   . Worried About Charity fundraiser in the Last Year: Not on file  . Ran Out of Food in the Last Year: Not on file  Transportation Needs:   . Lack of Transportation (Medical): Not  on file  . Lack of Transportation (Non-Medical): Not on file  Physical Activity:   . Days of Exercise per Week: Not on file  . Minutes of Exercise per Session: Not on file  Stress:   . Feeling of Stress : Not on file  Social Connections:   . Frequency of Communication with Friends and Family: Not on file  . Frequency of Social Gatherings with Friends and Family: Not on file  . Attends Religious Services: Not on file  . Active Member of Clubs or Organizations: Not on file  . Attends Archivist Meetings: Not on file  . Marital Status: Not on file  Intimate Partner Violence:   . Fear of Current or Ex-Partner: Not on file  . Emotionally Abused: Not on file  . Physically Abused: Not on file  . Sexually Abused: Not on file    Outpatient Medications Prior to Visit  Medication Sig Dispense Refill  . albuterol (PROVENTIL) (5 MG/ML) 0.5% nebulizer solution Take 1 mL (5 mg total) by nebulization every 6 (six) hours as needed for wheezing or shortness of breath. 20 mL 0  . ALPRAZolam (XANAX PO) Take 1 tablet by mouth at bedtime.    . Aspirin-Acetaminophen (GOODYS BODY PAIN PO) Take 1-2 packets by mouth every 8 (eight) hours as needed (pain).     . busPIRone (BUSPAR) 10 MG tablet Take 10 mg by mouth daily.    . ondansetron (ZOFRAN-ODT) 4 MG disintegrating tablet Take 4 mg by mouth 2 (two) times daily as needed for nausea/vomiting.    Marland Kitchen oxyCODONE (OXY IR/ROXICODONE) 5 MG immediate release tablet Take 1 tablet (5 mg total) by mouth every 6 (six) hours  as needed for severe pain. 20 tablet 0  . PROAIR HFA 108 (90 Base) MCG/ACT inhaler Inhale 1 puff into the lungs every 4 (four) hours as needed for wheezing or shortness of breath.     . Vitamin D, Ergocalciferol, (DRISDOL) 1.25 MG (50000 UT) CAPS capsule Take 1 capsule (50,000 Units total) by mouth every 7 (seven) days. 12 capsule 0  . metoCLOPramide (REGLAN) 5 MG tablet Take 1 tablet (5 mg total) by mouth 4 (four) times daily -  before meals and at bedtime. (Patient not taking: Reported on 09/07/2018) 120 tablet 0  . zolpidem (AMBIEN) 5 MG tablet Take 5 mg by mouth at bedtime.    . predniSONE (DELTASONE) 20 MG tablet Take 2 tablets (40 mg total) by mouth daily. (Patient not taking: Reported on 09/07/2018) 8 tablet 0   No facility-administered medications prior to visit.    Allergies  Allergen Reactions  . Lithium Nausea And Vomiting  . Celebrex [Celecoxib] Other (See Comments)    History of bleeding ulcers  . Morphine Itching  . Quetiapine Other (See Comments)     tardive dyskinesia  . Varenicline Tartrate Other (See Comments)    hallucinations    Review of Systems  Constitutional: Negative.   HENT: Negative.   Eyes: Negative.   Respiratory: Positive for cough, shortness of breath and wheezing.        Baseline symptoms  Cardiovascular: Negative.   Gastrointestinal: Negative.   Endocrine: Negative.   Genitourinary: Negative.   Musculoskeletal: Negative.   Skin: Negative.   Allergic/Immunologic: Negative.   Neurological: Negative.   Hematological: Negative.   Psychiatric/Behavioral: Negative.   All other systems reviewed and are negative.      Objective:    Physical Exam Vitals and nursing note reviewed.  Constitutional:  General: She is not in acute distress.    Appearance: Normal appearance. She is normal weight. She is not ill-appearing or toxic-appearing.  Cardiovascular:     Rate and Rhythm: Normal rate and regular rhythm.  Pulmonary:     Effort: Pulmonary  effort is normal. No respiratory distress.     Breath sounds: Normal breath sounds.  Skin:    Capillary Refill: Capillary refill takes less than 2 seconds.  Neurological:     General: No focal deficit present.     Mental Status: She is alert and oriented to person, place, and time. Mental status is at baseline.  Psychiatric:        Mood and Affect: Mood normal.        Behavior: Behavior normal.        Thought Content: Thought content normal.        Judgment: Judgment normal.     BP 119/76   Pulse 87   Temp 97.8 F (36.6 C)   Resp 12   Ht 5' 3.5" (1.613 m)   Wt 94 lb 12.8 oz (43 kg)   SpO2 94%   BMI 16.53 kg/m  Wt Readings from Last 3 Encounters:  06/08/19 94 lb 12.8 oz (43 kg)  03/13/19 85 lb 15.7 oz (39 kg)  12/15/18 87 lb (39.5 kg)    Health Maintenance Due  Topic Date Due  . MAMMOGRAM  03/22/2010  . COLONOSCOPY  06/25/2011  . INFLUENZA VACCINE  01/23/2019  . PNA vac Low Risk Adult (2 of 2 - PPSV23) 01/31/2019    There are no preventive care reminders to display for this patient.   Lab Results  Component Value Date   TSH 1.750 05/22/2018   Lab Results  Component Value Date   WBC 5.2 05/12/2019   HGB 13.0 05/12/2019   HCT 41.4 05/12/2019   MCV 97.2 05/12/2019   PLT 380 05/12/2019   Lab Results  Component Value Date   NA 135 05/12/2019   K 4.3 05/12/2019   CO2 24 05/12/2019   GLUCOSE 107 (H) 05/12/2019   BUN 19 05/12/2019   CREATININE 1.05 (H) 05/12/2019   BILITOT 0.4 12/15/2018   ALKPHOS 109 12/15/2018   AST 18 12/15/2018   ALT 14 12/15/2018   PROT 6.3 (L) 12/15/2018   ALBUMIN 3.5 12/15/2018   CALCIUM 8.5 (L) 05/12/2019   ANIONGAP 15 05/12/2019   GFR 97.18 10/03/2011   No results found for: CHOL No results found for: HDL No results found for: LDLCALC No results found for: TRIG No results found for: CHOLHDL Lab Results  Component Value Date   HGBA1C 5.2 08/27/2017       Assessment & Plan:   Problem List Items Addressed This Visit     None    Visit Diagnoses    Fall, initial encounter    -  Primary   Relevant Medications   acetaminophen (TYLENOL) 500 MG tablet   Shortness of breath       Relevant Orders   DG Chest 2 View (Completed)       Meds ordered this encounter  Medications  . acetaminophen (TYLENOL) 500 MG tablet    Sig: Take 1 tablet (500 mg total) by mouth every 6 (six) hours as needed.    Dispense:  30 tablet    Refill:  0    Order Specific Question:   Supervising Provider    Answer:   Delia Chimes A [id number]   PLAN  Xray negative - perhaps  some bowel abnormalities given gas patterns. But no cardiopulmonary abnormalities.  Will give tylenol 500mg  PO q6h for pain, given pt age, overall health, and hx of bleeding ulcer  I brought up to her that we should investigate what caused this fall, as she has had a number of such incidents in the preceding months. I let her know that I believe it to be important to find the etiology of these events to prevent further falls and continue with early detection of any abnormalities. However, she firmly denied this and stated that she planned to follow up with her PCP at [facility].  Patient encouraged to call clinic with any questions, comments, or concerns.   Maximiano Coss, NP

## 2019-06-09 NOTE — Telephone Encounter (Signed)
I would be unwilling to call in tramadol if she is currently being seen by Pain Management. Most pain management providers take issue with other providers sending in controlled substances for their patients. I am also concerned about any further sedation this may cause for [name].   Thank you,  Kathrin Ruddy, NP

## 2019-06-16 NOTE — Telephone Encounter (Signed)
Left message for patient.  See below message from [facility]

## 2019-07-24 ENCOUNTER — Emergency Department (HOSPITAL_COMMUNITY)
Admission: EM | Admit: 2019-07-24 | Discharge: 2019-07-24 | Disposition: A | Discharge status: Home / Self Care | Payer: Medicare Other | Attending: Emergency Medicine | Admitting: Emergency Medicine

## 2019-07-24 ENCOUNTER — Other Ambulatory Visit: Payer: Self-pay

## 2019-07-24 ENCOUNTER — Encounter (HOSPITAL_COMMUNITY): Payer: Self-pay

## 2019-07-24 DIAGNOSIS — F17210 Nicotine dependence, cigarettes, uncomplicated: Secondary | ICD-10-CM | POA: Diagnosis not present

## 2019-07-24 DIAGNOSIS — Z79899 Other long term (current) drug therapy: Secondary | ICD-10-CM | POA: Diagnosis not present

## 2019-07-24 DIAGNOSIS — R600 Localized edema: Secondary | ICD-10-CM | POA: Insufficient documentation

## 2019-07-24 DIAGNOSIS — J449 Chronic obstructive pulmonary disease, unspecified: Secondary | ICD-10-CM | POA: Insufficient documentation

## 2019-07-24 DIAGNOSIS — R609 Edema, unspecified: Secondary | ICD-10-CM

## 2019-07-24 DIAGNOSIS — R531 Weakness: Secondary | ICD-10-CM | POA: Diagnosis present

## 2019-07-24 NOTE — Discharge Instructions (Addendum)
For the swelling in your feet, elevate them above your heart is much as possible.  Also stay on a low-salt diet to prevent swelling.  See the vascular doctor next week as you have scheduled.  They may be able to help you with the swelling.

## 2019-07-24 NOTE — ED Triage Notes (Signed)
EMS states they were phoned by pt's. Partner, who reported pt. To have decreased appetite, weakness and "swelling of her legs" recently. She rec'd. rx for Lasix from her pcp some 4 days ago, with some bilat. L.E. edema persisting. She arrives in no distress.

## 2019-07-24 NOTE — ED Provider Notes (Signed)
[facility] DEPT Provider Note   CSN: [id number] Arrival date & time: 07/24/19  0815     History Chief Complaint  Patient presents with  . Weakness    [name] is a 69 y.o. female.  HPI Patient presents for evaluation of swelling in her feet.  She states it has been there for a while.  She denies chest pain, shortness of breath, fever, chills, cough, nausea or vomiting.  She is taking her usual medications.  She states that she plans on seeing a vascular doctor next week to evaluate the leg problem.  There are no other known modifying factors.    Past Medical History:  Diagnosis Date  . Acute duodenal ulcer with hemorrhage and perforation, with obstruction ([place]) 06/26/2004   multiple pyloric perferations  . Allergy   . Anemia   . Anxiety    sees [name] NP at Dr. Radonna Ricker office  . Asthma    hx of   . Barrett's esophagus   . Chronic abdominal pain    narcotic dependence, dr gyarteng-dak at [facility]  . 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 Complication of anesthesia    pt states had too much xanax on board - agitated   . COPD (chronic obstructive pulmonary disease) ([place])   . Depression   . Exertional shortness of breath   . Fall 11/23/2014   frequent falls  . Fx of fibula 07-28-11   left fibula, 2 places   . Fx two ribs-open 08-24-11   left 4th and 5th  . GASTRIC OUTLET OBSTRUCTION 08/28/2007   In July 2008 she underwent laparoscopic enterolysis, Nissen fundoplication over a [id number] bougie, single pledgeted suture colosure of the hiatus and a 3 suture wrap.  Lap truncal vagotomy and a loop gastrojejunostomy was performed.  This was revised in August of 2009 to a roux en Y gastrojejujostomy.     . Gastroparesis 06/03/2007   Qualifier: Diagnosis of  By: [name] MD, [name]   . GERD (gastroesophageal reflux disease)   . History of blood transfusion    "related to OR; maybe when I perforated that  ulcer"  . History of hiatal hernia   . Lap Nissen + truncal vagotomy July 2008 05/13/2013  . LEG EDEMA 01/19/2008   Qualifier: Diagnosis of  By: [name] MD, [name]   . Macular degeneration, bilateral    early onset, severe, legally blind and worsening. Followed by dr. Katy Fitch  . MENOPAUSE 01/07/2007   Qualifier: Diagnosis of  By: [name], CMA, [place]    . Narcotic abuse ([place])   . Osteoporosis    severe  . Oxygen deficiency   . Oxygen dependent    3L/[place]   . Peptic ulcer disease    dr Oletta Lamas  . Pneumonia, organism unspecified(486) 11/07/2010   Assoc with R Parapneumonic effusion 09/2010    - Tapped 10/05/10    - CxR resolved 11/07/2010    . S/P jejunostomy Feb 2016 07/26/2014  . Thyroid nodule 2013   benign after biopsy w/ Dr. Redmond Baseman    Patient Active Problem List   Diagnosis Date Noted  . Abdominal pain, chronic, epigastric 05/31/2017  . Episodic polysubstance dependence ([place]) 05/31/2017  . At high risk for injury related to fall 05/18/2017  . Chronic nausea 05/18/2017  . Poor compliance with medication 11/20/2016  . Insomnia 11/20/2016  . Early onset macular degeneration 11/20/2016  . Legally blind 11/20/2016  . Abdominal pain   .  Hypoxia 04/08/2015  . Opioid type dependence, continuous ([place])   . Protein-calorie malnutrition ([place])   . Episode of recurrent major depressive disorder ([place])   . Secondary hyperparathyroidism ([place]) 01/10/2015  . Vitamin D deficiency 01/10/2015  . 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 Anemia, iron deficiency 11/03/2014  . Chronic pain syndrome 10/24/2014  . Osteoporosis 10/05/2014  . Vertebral compression fracture ([place]) 10/05/2014  . Coronary artery disease due to calcified coronary lesion 10/05/2014  . Weight loss   . Tobacco abuse   . History of subtotal gastrectomy with Roux-en-Y recontruction   . Orthostatic hypotension 08/11/2014  . Memory loss, short term 07/18/2014  . Constipation, chronic 07/10/2013  . Chronic abdominal pain    . COPD (chronic obstructive pulmonary disease) ([place]) 11/07/2010  . Major depressive disorder, recurrent episode, moderate ([place]) 06/06/2010  . 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 Anxiety state 01/07/2007  . Barrett's esophagus 07/03/2005    Past Surgical History:  Procedure Laterality Date  . BIOPSY THYROID  08-13-11   benign nodule, per Dr. Melida Quitter   . CATARACT EXTRACTION W/ INTRAOCULAR LENS  IMPLANT, BILATERAL Bilateral   . CHOLECYSTECTOMY N/A 12/02/2017   Procedure: LAPAROSCOPIC CHOLECYSTECTOMY WITH INTRAOPERATIVE CHOLANGIOGRAM ERAS PATHWAY;  Surgeon: Johnathan Hausen, MD;  Location: [facility];  Service: General;  Laterality: N/A;  . COLONOSCOPY    . DILATION AND CURETTAGE OF UTERUS    . ESOPHAGOGASTRODUODENOSCOPY  12-29-09   dr qadeer at [facility], several gastric ulcers  . ESOPHAGOGASTRODUODENOSCOPY N/A 09/25/2012   Procedure: ESOPHAGOGASTRODUODENOSCOPY (EGD);  Surgeon: Beryle Beams, MD;  Location: [facility];  Service: Endoscopy;  Laterality: N/A;  . ESOPHAGOGASTRODUODENOSCOPY (EGD) WITH PROPOFOL N/A 06/27/2017   Procedure: ESOPHAGOGASTRODUODENOSCOPY (EGD) WITH PROPOFOL;  Surgeon: Carol Ada, MD;  Location: [facility];  Service: Endoscopy;  Laterality: N/A;  . FRACTURE SURGERY Left 2010   hip and leg s/p fall  . GASTROJEJUNOSTOMY  02/20/2008   Roux en Y gastrojejunsotomy w 1 foot Roux limb, laparotomy, takedown loop gastrojejunostomy with removal of Ntinol stent  . GASTROSTOMY N/A 07/26/2014   Procedure: LAPRASCOPIC ASSISTED OPEN PLACEMENT OF JEJUNOSTOMY TUBE;  Surgeon: Pedro Earls, MD;  Location: [facility];  Service: General;  Laterality: N/A;  . HERNIA REPAIR     "hiatal"  . JOINT REPLACEMENT    . LAPAROSCOPIC NISSEN FUNDOPLICATION  [id number]  . LAPAROSCOPIC PARTIAL GASTRECTOMY N/A 06/29/2013   Procedure: Gastrectomy and GJ Tube placement;  Surgeon: Pedro Earls, MD;  Location: [facility];  Service: General;  Laterality: N/A;   . ORIF FEMORAL NECK FRACTURE W/ DHS Right 03/2014  . ORIF HIP FRACTURE Left 2010  . REPAIR OF PERFORATED ULCER    . TONSILLECTOMY  1974  . TRUNCAL VAGOTOMY  12/2006   Laparoscopic enterolysis, laparoscopic Nissen's Fundoplication, laparoscopic truncal vagotomy & loop gastrojejunostomy     OB History   No obstetric history on file.     Family History  Problem Relation Age of Onset  . Cancer Mother        kidney/bladder cancer, magliant skin cancer on face, cervical cancer  . Stroke Mother   . Hypertension Mother   . Celiac disease Father   . Cancer Father        metastatic renal cell carcinoma, pancreatic cancer, colon polyps  . Colon cancer Maternal Uncle   . Breast cancer Neg Hx   . Ovarian cancer Neg Hx   . Endometrial cancer Neg Hx     Social  History   Tobacco Use  . Smoking status: Current Every Day Smoker    Packs/day: 1.00    Years: 51.00    Pack years: 51.00    Types: Cigarettes  . Smokeless tobacco: Never Used  Substance Use Topics  . Alcohol use: No    Alcohol/week: 0.0 standard drinks  . Drug use: No    Home Medications Prior to Admission medications   Medication Sig Start Date End Date Taking? Authorizing Provider  acetaminophen (TYLENOL) 500 MG tablet Take 1 tablet (500 mg total) by mouth every 6 (six) hours as needed. 06/08/19   Maximiano Coss, NP  albuterol (PROVENTIL) (5 MG/ML) 0.5% nebulizer solution Take 1 mL (5 mg total) by nebulization every 6 (six) hours as needed for wheezing or shortness of breath. 08/05/18   Carlisle Cater, PA-C  ALPRAZolam (XANAX PO) Take 1 tablet by mouth at bedtime.    [provider]  Aspirin-Acetaminophen (GOODYS BODY PAIN PO) Take 1-2 packets by mouth every 8 (eight) hours as needed (pain).     [provider]  busPIRone (BUSPAR) 10 MG tablet Take 10 mg by mouth daily. 09/06/18   [provider]  metoCLOPramide (REGLAN) 5 MG tablet Take 1 tablet (5 mg total) by mouth 4 (four) times daily -   before meals and at bedtime. Patient not taking: Reported on 09/07/2018 08/18/18   Shawnee Knapp, MD  ondansetron (ZOFRAN-ODT) 4 MG disintegrating tablet Take 4 mg by mouth 2 (two) times daily as needed for nausea/vomiting. 07/20/18   [provider]  oxyCODONE (OXY IR/ROXICODONE) 5 MG immediate release tablet Take 1 tablet (5 mg total) by mouth every 6 (six) hours as needed for severe pain. 12/02/17   Johnathan Hausen, MD  PROAIR HFA 108 [phone] Base) MCG/ACT inhaler Inhale 1 puff into the lungs every 4 (four) hours as needed for wheezing or shortness of breath.  08/06/18   [provider]  Vitamin D, Ergocalciferol, (DRISDOL) 1.25 MG (50000 UT) CAPS capsule Take 1 capsule (50,000 Units total) by mouth every 7 (seven) days. 06/01/18   Rutherford Guys, MD  zolpidem (AMBIEN) 5 MG tablet Take 5 mg by mouth at bedtime. 07/20/18   [provider]    Allergies    Lithium, Celebrex [celecoxib], Morphine, Quetiapine, and Varenicline tartrate  Review of Systems   Review of Systems  All other systems reviewed and are negative.   Physical Exam Updated Vital Signs BP 115/75   Pulse 83   Temp 98.5 F (36.9 C) (Rectal)   Resp 19   SpO2 97%   Physical Exam Vitals and nursing note reviewed.  Constitutional:      General: She is not in acute distress.    Appearance: She is well-developed. She is not ill-appearing, toxic-appearing or diaphoretic.     Comments: Frail, elderly  HENT:     Head: Normocephalic and atraumatic.     Right Ear: External ear normal.     Left Ear: External ear normal.  Eyes:     Conjunctiva/sclera: Conjunctivae normal.     Pupils: Pupils are equal, round, and reactive to light.  Neck:     Trachea: Phonation normal.  Cardiovascular:     Rate and Rhythm: Normal rate and regular rhythm.     Heart sounds: Normal heart sounds.     Comments: Normal capillary refill, toes of both feet. Pulmonary:     Effort: Pulmonary effort is normal.     Breath sounds:  Normal breath sounds.  Comments: Oxygen saturation 98% on room air. Musculoskeletal:        General: Normal range of motion.     Cervical back: Normal range of motion and neck supple.     Comments: Trace edema of the ankles and feet bilaterally.  Skin:    General: Skin is warm and dry.  Neurological:     Mental Status: She is alert and oriented to person, place, and time.     Cranial Nerves: No cranial nerve deficit.     Sensory: No sensory deficit.     Motor: No abnormal muscle tone.     Coordination: Coordination normal.  Psychiatric:        Mood and Affect: Mood normal.        Behavior: Behavior normal.        Thought Content: Thought content normal.        Judgment: Judgment normal.     ED Results / Procedures / Treatments   Labs (all labs ordered are listed, but only abnormal results are displayed) Labs Reviewed - No data to display  EKG None  Radiology No results found.  Procedures Procedures (including critical care time)  Medications Ordered in ED Medications - No data to display  ED Course  I have reviewed the triage vital signs and the nursing notes.  Pertinent labs & imaging results that were available during my care of the patient were reviewed by me and considered in my medical decision making (see chart for details).    MDM Rules/Calculators/A&P                       Patient Vitals for the past 24 hrs:  BP Temp Temp src Pulse Resp SpO2  07/24/19 1030 115/75 - - 83 19 97 %  07/24/19 1000 115/72 - - 85 (!) 24 95 %  07/24/19 0949 131/75 - - 97 20 100 %  07/24/19 0847 124/60 98.5 F (36.9 C) Rectal 97 16 97 %    11:10 AM Reevaluation with update and discussion. After initial assessment and treatment, an updated evaluation reveals no change in clinical status, she is anxious to go home.Daleen Bo   Medical Decision Making: Peripheral edema without other complaints.  No evidence for acute congestive heart failure.  No indication for  hospitalization or in-depth ED evaluation, at this time.  CRITICAL CARE-no Performed by: Daleen Bo  Nursing Notes Reviewed/ Care Coordinated Applicable Imaging Reviewed Interpretation of Laboratory Data incorporated into ED treatment  The patient appears reasonably screened and/or stabilized for discharge and I doubt any other medical condition or other [facility] requiring further screening, evaluation, or treatment in the ED at this time prior to discharge.  Plan: Home Medications-continue usual; Home Treatments-elevate feet as needed swelling, low-salt diet; return here if the recommended treatment, does not improve the symptoms; Recommended follow up-PCP, as needed  Final Clinical Impression(s) / ED Diagnoses Final diagnoses:  Peripheral edema    Rx / DC Orders ED Discharge Orders    None       Daleen Bo, MD 07/24/19 1110

## 2019-07-24 NOTE — ED Notes (Signed)
She remains in no distress and has phoned for a ride home.

## 2019-07-25 ENCOUNTER — Other Ambulatory Visit: Payer: Self-pay

## 2019-07-25 ENCOUNTER — Encounter (HOSPITAL_COMMUNITY): Payer: Self-pay

## 2019-07-25 ENCOUNTER — Emergency Department (HOSPITAL_COMMUNITY)
Admission: EM | Admit: 2019-07-25 | Discharge: 2019-07-25 | Disposition: A | Discharge status: Home / Self Care | Payer: Medicare Other | Attending: Emergency Medicine | Admitting: Emergency Medicine

## 2019-07-25 ENCOUNTER — Emergency Department (HOSPITAL_COMMUNITY): Payer: Medicare Other

## 2019-07-25 DIAGNOSIS — J449 Chronic obstructive pulmonary disease, unspecified: Secondary | ICD-10-CM | POA: Diagnosis not present

## 2019-07-25 DIAGNOSIS — M79604 Pain in right leg: Secondary | ICD-10-CM | POA: Insufficient documentation

## 2019-07-25 DIAGNOSIS — M79605 Pain in left leg: Secondary | ICD-10-CM | POA: Insufficient documentation

## 2019-07-25 DIAGNOSIS — R2243 Localized swelling, mass and lump, lower limb, bilateral: Secondary | ICD-10-CM | POA: Diagnosis present

## 2019-07-25 DIAGNOSIS — R0602 Shortness of breath: Secondary | ICD-10-CM | POA: Diagnosis not present

## 2019-07-25 DIAGNOSIS — F17210 Nicotine dependence, cigarettes, uncomplicated: Secondary | ICD-10-CM | POA: Diagnosis not present

## 2019-07-25 DIAGNOSIS — Z20822 Contact with and (suspected) exposure to covid-19: Secondary | ICD-10-CM | POA: Diagnosis not present

## 2019-07-25 DIAGNOSIS — G8929 Other chronic pain: Secondary | ICD-10-CM | POA: Insufficient documentation

## 2019-07-25 DIAGNOSIS — Z79899 Other long term (current) drug therapy: Secondary | ICD-10-CM | POA: Insufficient documentation

## 2019-07-25 DIAGNOSIS — M7989 Other specified soft tissue disorders: Secondary | ICD-10-CM

## 2019-07-25 LAB — COMPREHENSIVE METABOLIC PANEL
ALT: 18 U/L (ref 0–44)
AST: 20 U/L (ref 15–41)
Albumin: 1.8 g/dL — ABNORMAL LOW (ref 3.5–5.0)
Alkaline Phosphatase: 134 U/L — ABNORMAL HIGH (ref 38–126)
Anion gap: 7 (ref 5–15)
BUN: 23 mg/dL (ref 8–23)
CO2: 29 mmol/L (ref 22–32)
Calcium: 7.7 mg/dL — ABNORMAL LOW (ref 8.9–10.3)
Chloride: 103 mmol/L (ref 98–111)
Creatinine, Ser: 0.78 mg/dL (ref 0.44–1.00)
GFR calc Af Amer: >60 mL/min (ref >60)
GFR calc non Af Amer: >60 mL/min (ref >60)
Glucose, Bld: 83 mg/dL (ref 70–99)
Potassium: 3.8 mmol/L (ref 3.5–5.1)
Sodium: 139 mmol/L (ref 135–145)
Total Bilirubin: 0.5 mg/dL (ref 0.3–1.2)
Total Protein: 4.3 g/dL — ABNORMAL LOW (ref 6.5–8.1)

## 2019-07-25 LAB — CBC WITH DIFFERENTIAL/PLATELET
Abs Immature Granulocytes: 0.06 10*3/uL (ref 0.00–0.07)
Basophils Absolute: 0.1 10*3/uL (ref 0.0–0.1)
Basophils Relative: 1 %
Eosinophils Absolute: 0.2 10*3/uL (ref 0.0–0.5)
Eosinophils Relative: 3 %
HCT: 35.9 % — ABNORMAL LOW (ref 36.0–46.0)
Hemoglobin: 11.6 g/dL — ABNORMAL LOW (ref 12.0–15.0)
Immature Granulocytes: 1 %
Lymphocytes Relative: 24 %
Lymphs Abs: 1.4 10*3/uL (ref 0.7–4.0)
MCH: 31.9 pg (ref 26.0–34.0)
MCHC: 32.3 g/dL (ref 30.0–36.0)
MCV: 98.6 fL (ref 80.0–100.0)
Monocytes Absolute: 0.6 10*3/uL (ref 0.1–1.0)
Monocytes Relative: 10 %
Neutro Abs: 3.6 10*3/uL (ref 1.7–7.7)
Neutrophils Relative %: 61 %
Platelets: 233 10*3/uL (ref 150–400)
RBC: 3.64 MIL/uL — ABNORMAL LOW (ref 3.87–5.11)
RDW: 14.6 % (ref 11.5–15.5)
WBC: 5.9 10*3/uL (ref 4.0–10.5)
nRBC: 0 % (ref 0.0–0.2)

## 2019-07-25 LAB — BRAIN NATRIURETIC PEPTIDE: B Natriuretic Peptide: 87.7 pg/mL (ref 0.0–100.0)

## 2019-07-25 MED ORDER — HYDROCODONE-ACETAMINOPHEN 5-325 MG PO TABS
1.0000 | ORAL_TABLET | Freq: Once | ORAL | Status: AC
  Administered 2019-07-25: 1 via ORAL
  Filled 2019-07-25: qty 1

## 2019-07-25 NOTE — ED Notes (Signed)
Pt continues to yell from her room for help.  Pt has been reminded numerous times to use the call light and she has the call button on her bed. Pt will not use call light and continues to scream.

## 2019-07-25 NOTE — ED Notes (Signed)
ED Provider at bedside. 

## 2019-07-25 NOTE — ED Provider Notes (Signed)
[facility] DEPT Provider Note   CSN: [id number] Arrival date & time: 07/25/19  1830     History Chief Complaint  Patient presents with  . Cough  . Leg Swelling    [name] is a 69 y.o. female.  69yo F w/ PMH including COPD on home O2, Barrett's esophagus, anxiety/depression, chronic narcotic use who p/w leg pain.  Patient presented with bilateral leg swelling yesterday and after evaluation was discharged home.  She returns today complaining of severe bilateral leg pain.  Patient is a poor historian and is not able to provide many details of her symptoms but states that she has having bilateral pain predominantly in her feet.  No pain stemming from her back.  Pain is severe.  She has noticed some swelling of her legs as well.  She mentioned a cough in triage and clarifies with me that she has a chronic cough related to her COPD and the cough has not been worse recently.  She reports chronic shortness of breath but no change.  No chest pain.  No fevers.  She has followed with pain management but takes this medication for other pain, not leg pain.  She ran out of her pain medication several days ago.  The history is provided by the patient.  Cough      Past Medical History:  Diagnosis Date  . Acute duodenal ulcer with hemorrhage and perforation, with obstruction ([place]) 06/26/2004   multiple pyloric perferations  . Allergy   . Anemia   . Anxiety    sees [name] NP at Dr. Radonna Ricker office  . Asthma    hx of   . Barrett's esophagus   . Chronic abdominal pain    narcotic dependence, dr gyarteng-dak at [facility]  . 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 Complication of anesthesia    pt states had too much xanax on board - agitated   . COPD (chronic obstructive pulmonary disease) ([place])   . Depression   . Exertional shortness of breath   . Fall 11/23/2014   frequent falls  . Fx of fibula 07-28-11   left  fibula, 2 places   . Fx two ribs-open 08-24-11   left 4th and 5th  . GASTRIC OUTLET OBSTRUCTION 08/28/2007   In July 2008 she underwent laparoscopic enterolysis, Nissen fundoplication over a [id number] bougie, single pledgeted suture colosure of the hiatus and a 3 suture wrap.  Lap truncal vagotomy and a loop gastrojejunostomy was performed.  This was revised in August of 2009 to a roux en Y gastrojejujostomy.     . Gastroparesis 06/03/2007   Qualifier: Diagnosis of  By: [name] MD, [name]   . GERD (gastroesophageal reflux disease)   . History of blood transfusion    "related to OR; maybe when I perforated that ulcer"  . History of hiatal hernia   . Lap Nissen + truncal vagotomy July 2008 05/13/2013  . LEG EDEMA 01/19/2008   Qualifier: Diagnosis of  By: [name] MD, [name]   . Macular degeneration, bilateral    early onset, severe, legally blind and worsening. Followed by dr. Katy Fitch  . MENOPAUSE 01/07/2007   Qualifier: Diagnosis of  By: [name], CMA, [place]    . Narcotic abuse ([place])   . Osteoporosis    severe  . Oxygen deficiency   . Oxygen dependent    3L/[place]   . Peptic ulcer disease    dr Oletta Lamas  . Pneumonia,  organism unspecified(486) 11/07/2010   Assoc with R Parapneumonic effusion 09/2010    - Tapped 10/05/10    - CxR resolved 11/07/2010    . S/P jejunostomy Feb 2016 07/26/2014  . Thyroid nodule 2013   benign after biopsy w/ Dr. Redmond Baseman    Patient Active Problem List   Diagnosis Date Noted  . Abdominal pain, chronic, epigastric 05/31/2017  . Episodic polysubstance dependence ([place]) 05/31/2017  . At high risk for injury related to fall 05/18/2017  . Chronic nausea 05/18/2017  . Poor compliance with medication 11/20/2016  . Insomnia 11/20/2016  . Early onset macular degeneration 11/20/2016  . Legally blind 11/20/2016  . Abdominal pain   . Hypoxia 04/08/2015  . Opioid type dependence, continuous ([place])   . Protein-calorie malnutrition ([place])   . Episode of recurrent major depressive disorder ([place])    . Secondary hyperparathyroidism ([place]) 01/10/2015  . Vitamin D deficiency 01/10/2015  . 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 Anemia, iron deficiency 11/03/2014  . Chronic pain syndrome 10/24/2014  . Osteoporosis 10/05/2014  . Vertebral compression fracture ([place]) 10/05/2014  . Coronary artery disease due to calcified coronary lesion 10/05/2014  . Weight loss   . Tobacco abuse   . History of subtotal gastrectomy with Roux-en-Y recontruction   . Orthostatic hypotension 08/11/2014  . Memory loss, short term 07/18/2014  . Constipation, chronic 07/10/2013  . Chronic abdominal pain   . COPD (chronic obstructive pulmonary disease) ([place]) 11/07/2010  . Major depressive disorder, recurrent episode, moderate ([place]) 06/06/2010  . 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 Anxiety state 01/07/2007  . Barrett's esophagus 07/03/2005    Past Surgical History:  Procedure Laterality Date  . BIOPSY THYROID  08-13-11   benign nodule, per Dr. Melida Quitter   . CATARACT EXTRACTION W/ INTRAOCULAR LENS  IMPLANT, BILATERAL Bilateral   . CHOLECYSTECTOMY N/A 12/02/2017   Procedure: LAPAROSCOPIC CHOLECYSTECTOMY WITH INTRAOPERATIVE CHOLANGIOGRAM ERAS PATHWAY;  Surgeon: Johnathan Hausen, MD;  Location: [facility];  Service: General;  Laterality: N/A;  . COLONOSCOPY    . DILATION AND CURETTAGE OF UTERUS    . ESOPHAGOGASTRODUODENOSCOPY  12-29-09   dr qadeer at [facility], several gastric ulcers  . ESOPHAGOGASTRODUODENOSCOPY N/A 09/25/2012   Procedure: ESOPHAGOGASTRODUODENOSCOPY (EGD);  Surgeon: Beryle Beams, MD;  Location: [facility];  Service: Endoscopy;  Laterality: N/A;  . ESOPHAGOGASTRODUODENOSCOPY (EGD) WITH PROPOFOL N/A 06/27/2017   Procedure: ESOPHAGOGASTRODUODENOSCOPY (EGD) WITH PROPOFOL;  Surgeon: Carol Ada, MD;  Location: [facility];  Service: Endoscopy;  Laterality: N/A;  . FRACTURE SURGERY Left 2010   hip and leg s/p fall  . GASTROJEJUNOSTOMY   02/20/2008   Roux en Y gastrojejunsotomy w 1 foot Roux limb, laparotomy, takedown loop gastrojejunostomy with removal of Ntinol stent  . GASTROSTOMY N/A 07/26/2014   Procedure: LAPRASCOPIC ASSISTED OPEN PLACEMENT OF JEJUNOSTOMY TUBE;  Surgeon: Pedro Earls, MD;  Location: [facility];  Service: General;  Laterality: N/A;  . HERNIA REPAIR     "hiatal"  . JOINT REPLACEMENT    . LAPAROSCOPIC NISSEN FUNDOPLICATION  [id number]  . LAPAROSCOPIC PARTIAL GASTRECTOMY N/A 06/29/2013   Procedure: Gastrectomy and GJ Tube placement;  Surgeon: Pedro Earls, MD;  Location: [facility];  Service: General;  Laterality: N/A;  . ORIF FEMORAL NECK FRACTURE W/ DHS Right 03/2014  . ORIF HIP FRACTURE Left 2010  . REPAIR OF PERFORATED ULCER    . TONSILLECTOMY  1974  . TRUNCAL VAGOTOMY  12/2006   Laparoscopic enterolysis, laparoscopic Nissen's Fundoplication,  laparoscopic truncal vagotomy & loop gastrojejunostomy     OB History   No obstetric history on file.     Family History  Problem Relation Age of Onset  . Cancer Mother        kidney/bladder cancer, magliant skin cancer on face, cervical cancer  . Stroke Mother   . Hypertension Mother   . Celiac disease Father   . Cancer Father        metastatic renal cell carcinoma, pancreatic cancer, colon polyps  . Colon cancer Maternal Uncle   . Breast cancer Neg Hx   . Ovarian cancer Neg Hx   . Endometrial cancer Neg Hx     Social History   Tobacco Use  . Smoking status: Current Every Day Smoker    Packs/day: 1.00    Years: 51.00    Pack years: 51.00    Types: Cigarettes  . Smokeless tobacco: Never Used  Substance Use Topics  . Alcohol use: No    Alcohol/week: 0.0 standard drinks  . Drug use: No    Home Medications Prior to Admission medications   Medication Sig Start Date End Date Taking? Authorizing Provider  acetaminophen (TYLENOL) 500 MG tablet Take 1 tablet (500 mg total) by mouth every 6 (six) hours as needed. 06/08/19   Maximiano Coss, NP    albuterol (PROVENTIL) (5 MG/ML) 0.5% nebulizer solution Take 1 mL (5 mg total) by nebulization every 6 (six) hours as needed for wheezing or shortness of breath. 08/05/18   Carlisle Cater, PA-C  ALPRAZolam (XANAX PO) Take 1 tablet by mouth at bedtime.    [provider]  Aspirin-Acetaminophen (GOODYS BODY PAIN PO) Take 1-2 packets by mouth every 8 (eight) hours as needed (pain).     [provider]  busPIRone (BUSPAR) 10 MG tablet Take 10 mg by mouth daily. 09/06/18   [provider]  metoCLOPramide (REGLAN) 5 MG tablet Take 1 tablet (5 mg total) by mouth 4 (four) times daily -  before meals and at bedtime. Patient not taking: Reported on 09/07/2018 08/18/18   Shawnee Knapp, MD  ondansetron (ZOFRAN-ODT) 4 MG disintegrating tablet Take 4 mg by mouth 2 (two) times daily as needed for nausea/vomiting. 07/20/18   [provider]  oxyCODONE (OXY IR/ROXICODONE) 5 MG immediate release tablet Take 1 tablet (5 mg total) by mouth every 6 (six) hours as needed for severe pain. 12/02/17   Johnathan Hausen, MD  PROAIR HFA 108 [phone] Base) MCG/ACT inhaler Inhale 1 puff into the lungs every 4 (four) hours as needed for wheezing or shortness of breath.  08/06/18   [provider]  Vitamin D, Ergocalciferol, (DRISDOL) 1.25 MG (50000 UT) CAPS capsule Take 1 capsule (50,000 Units total) by mouth every 7 (seven) days. 06/01/18   Rutherford Guys, MD  zolpidem (AMBIEN) 5 MG tablet Take 5 mg by mouth at bedtime. 07/20/18   [provider]    Allergies    Lithium, Celebrex [celecoxib], Morphine, Quetiapine, and Varenicline tartrate  Review of Systems   Review of Systems  Respiratory: Positive for cough.    All other systems reviewed and are negative except that which was mentioned in HPI  Physical Exam Updated Vital Signs BP 105/69   Pulse 71   Temp 97.7 F (36.5 C) (Oral)   Resp (!) 26   SpO2 100%   Physical Exam Vitals and nursing note reviewed.  Constitutional:       Appearance: She is well-developed.     Comments:  Thin, chronically ill appearing, hysterical, distressed, yelling "help me! Help me!"  HENT:     Head: Normocephalic and atraumatic.  Eyes:     Conjunctiva/sclera: Conjunctivae normal.  Cardiovascular:     Rate and Rhythm: Normal rate and regular rhythm.     Pulses: Normal pulses.     Heart sounds: Normal heart sounds. No murmur.  Pulmonary:     Effort: Pulmonary effort is normal.     Breath sounds: Normal breath sounds.  Abdominal:     General: Bowel sounds are normal. There is no distension.     Palpations: Abdomen is soft.     Tenderness: There is no abdominal tenderness.  Musculoskeletal:     Cervical back: Neck supple.     Comments: Trace edema of BLE; 2+ DP pulses, no skin changes, no joint swelling  Skin:    General: Skin is warm and dry.     Capillary Refill: Capillary refill takes less than 2 seconds.     Findings: No erythema or rash.  Neurological:     Mental Status: She is alert and oriented to person, place, and time.     Comments: Fluent speech  Psychiatric:     Comments: Distressed, anxious     ED Results / Procedures / Treatments   Labs (all labs ordered are listed, but only abnormal results are displayed) Labs Reviewed  CBC WITH DIFFERENTIAL/PLATELET - Abnormal; Notable for the following components:      Result Value   RBC 3.64 (*)    Hemoglobin 11.6 (*)    HCT 35.9 (*)    All other components within normal limits  COMPREHENSIVE METABOLIC PANEL - Abnormal; Notable for the following components:   Calcium 7.7 (*)    Total Protein 4.3 (*)    Albumin 1.8 (*)    Alkaline Phosphatase 134 (*)    All other components within normal limits  SARS CORONAVIRUS 2 (TAT 6-24 HRS)  BRAIN NATRIURETIC PEPTIDE  URINALYSIS, ROUTINE W REFLEX MICROSCOPIC  RAPID URINE DRUG SCREEN, HOSP PERFORMED    EKG None  Radiology DG Chest Port 1 View  Result Date: 07/25/2019 CLINICAL DATA:  69 year old female with cough.   COPD. EXAM: PORTABLE CHEST 1 VIEW COMPARISON:  Chest radiographs 06/08/2019 and earlier. Chest CT 10/01/2017. FINDINGS: Portable AP semi upright view at 1920 hours. Chronically large lung volumes. Stable cardiac size and mediastinal contours. Visualized tracheal air column is within normal limits. Stable lung markings. Allowing for portable technique the lungs are clear. No pneumothorax. No pleural effusion is evident. Chronic epigastric surgical clips. No acute osseous abnormality identified. IMPRESSION: Chronic large lung volumes with Emphysema (ICD10-J43.9). No acute cardiopulmonary abnormality. Electronically Signed   By: Genevie Ann M.D.   On: 07/25/2019 19:44    Procedures Procedures (including critical care time)  Medications Ordered in ED Medications  HYDROcodone-acetaminophen (NORCO/VICODIN) 5-325 MG per tablet 1 tablet (1 tablet Oral Given 07/25/19 1936)    ED Course  I have reviewed the triage vital signs and the nursing notes.  Pertinent labs & imaging results that were available during my care of the patient were reviewed by me and considered in my medical decision making (see chart for details).    MDM Rules/Calculators/A&P                      Pt was hysterical on arrival, initially refusing vital signs. Continually yelling "help me" from her room. When I went in, she was complaining of severe b/l leg  pain. No skin changes to suggest infection, normal pulses and no signs of vascular compromise. Denies back pain to suggest spinal process. I offered a dose of her home medication but explained that I cannot refill chronic narcotic prescriptions.  CXR negative acute.  Lab work shows reassuring CBC, normal CMP, normal BNP.  No evidence of heart failure, liver failure, or kidney disease to explain the patient's edema.  I mentioned getting outpatient DVT ultrasound tomorrow and she states that she has already had both legs ultrasounded at [facility].  Chart review shows that she  was here a few months ago for similar complaints therefore I doubt acute life-threatening process.  I have recommended follow-up with PCP and discuss supportive measures including compression stockings, elevation, and low-salt diet.  She voiced understanding. Final Clinical Impression(s) / ED Diagnoses Final diagnoses:  Leg swelling  Pain in both lower extremities  Other chronic pain    Rx / DC Orders ED Discharge Orders         Ordered    VAS [place] LOWER EXTREMITY VENOUS (DVT)  Status:  Canceled     07/25/19 2138           Lajuana Patchell, Wenda [place], MD 07/25/19 2210

## 2019-07-25 NOTE — ED Notes (Signed)
An After Visit Summary was printed and given to the patient. Discharge instructions given and no further questions at this time. Pt states her roommate is giving her a ride home.

## 2019-07-25 NOTE — ED Notes (Signed)
Pt arrived via [facility] from home CC productive cough X 2 days and bilateral leg pain X 2 weeks   VSS afebrile   Hx Emphysema and CPODon 3L oxygen

## 2019-07-25 NOTE — ED Notes (Signed)
Pt screaming for pain medications, RN advised pt that a provider would need to assess her first. Pt educated to use call light instead of screaming out so that staff could assist patient.

## 2019-07-25 NOTE — ED Notes (Addendum)
Pt was roomed in room 12 by EMS.  Pt immediately began screaming for help.  I went in to pt room and she told me she needed pain meds.  I advised her that we could not do anything for pain until doctor sees her and that I would be in momentarily to obtain v/s while the nurse was still getting a report. Pt stated "I dont want vitals, I want the doctor."

## 2019-07-26 LAB — SARS CORONAVIRUS 2 (TAT 6-24 HRS): SARS Coronavirus 2: NEGATIVE

## 2019-08-03 ENCOUNTER — Ambulatory Visit (HOSPITAL_COMMUNITY)
Admission: RE | Admit: 2019-08-03 | Discharge: 2019-08-03 | Disposition: A | Discharge status: Home / Self Care | Payer: Medicare Other | Source: Ambulatory Visit | Attending: Surgery | Admitting: Surgery

## 2019-08-03 ENCOUNTER — Encounter: Payer: Medicare Other | Admitting: Vascular Surgery

## 2019-08-03 ENCOUNTER — Other Ambulatory Visit: Payer: Self-pay

## 2019-08-03 DIAGNOSIS — R600 Localized edema: Secondary | ICD-10-CM

## 2019-08-04 ENCOUNTER — Encounter: Payer: Self-pay | Admitting: Vascular Surgery

## 2019-08-04 ENCOUNTER — Ambulatory Visit (INDEPENDENT_AMBULATORY_CARE_PROVIDER_SITE_OTHER): Payer: Medicare Other | Admitting: Vascular Surgery

## 2019-08-04 VITALS — BP 100/64 | HR 89 | Temp 97.0°F | Resp 14 | Ht 63.0 in | Wt 90.0 lb

## 2019-08-04 DIAGNOSIS — M7989 Other specified soft tissue disorders: Secondary | ICD-10-CM

## 2019-08-04 DIAGNOSIS — I872 Venous insufficiency (chronic) (peripheral): Secondary | ICD-10-CM | POA: Diagnosis not present

## 2019-08-04 DIAGNOSIS — I739 Peripheral vascular disease, unspecified: Secondary | ICD-10-CM

## 2019-08-04 NOTE — Progress Notes (Signed)
Patient name: [name] MRN: [id number] DOB: November 24, 1950 Sex: female  REASON FOR CONSULT: Bilateral leg swelling  HPI: [name] is a 69 y.o. female, with a several month history of swelling extending from her hips down to her feet.  She also complains of numbness and tingling in her feet.  Although she states the symptoms have only been present for a few months her chart review shows that she has evidence of leg swelling symptoms dating back as far as 2009 on evaluation by her primary care physician.  She has a known history of peripheral arterial disease with a recent arteriogram by Dr. Einar Gip in October 2020.  This showed a left superficial femoral artery occlusion and a 60% stenosis of her distal right external iliac artery.  The patient has been wearing compression stockings recently and states her symptoms are significantly improved with these.  She apparently was told to decrease her activity and lay in bed and elevate her legs.  I discussed with her today that she is certainly not at risk by increasing her activities.  Other medical problems include chronic abdominal pain, COPD, tobacco abuse.  Greater than 3 minutes today spent regarding smoking cessation counseling.  Past Medical History:  Diagnosis Date  . Acute duodenal ulcer with hemorrhage and perforation, with obstruction ([place]) 06/26/2004   multiple pyloric perferations  . Allergy   . Anemia   . Anxiety    sees [name] NP at Dr. Radonna Ricker office  . Asthma    hx of   . Barrett's esophagus   . Chronic abdominal pain    narcotic dependence, dr gyarteng-dak at [facility]  . 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 Complication of anesthesia    pt states had too much xanax on board - agitated   . COPD (chronic obstructive pulmonary disease) ([place])   . Depression   . Exertional shortness of breath   . Fall 11/23/2014   frequent falls  . Fx of fibula 07-28-11   left fibula, 2 places    . Fx two ribs-open 08-24-11   left 4th and 5th  . GASTRIC OUTLET OBSTRUCTION 08/28/2007   In July 2008 she underwent laparoscopic enterolysis, Nissen fundoplication over a [id number] bougie, single pledgeted suture colosure of the hiatus and a 3 suture wrap.  Lap truncal vagotomy and a loop gastrojejunostomy was performed.  This was revised in August of 2009 to a roux en Y gastrojejujostomy.     . Gastroparesis 06/03/2007   Qualifier: Diagnosis of  By: [name] MD, [name]   . GERD (gastroesophageal reflux disease)   . History of blood transfusion    "related to OR; maybe when I perforated that ulcer"  . History of hiatal hernia   . Lap Nissen + truncal vagotomy July 2008 05/13/2013  . LEG EDEMA 01/19/2008   Qualifier: Diagnosis of  By: [name] MD, [name]   . Macular degeneration, bilateral    early onset, severe, legally blind and worsening. Followed by dr. Katy Fitch  . MENOPAUSE 01/07/2007   Qualifier: Diagnosis of  By: [name], CMA, [place]    . Narcotic abuse ([place])   . Osteoporosis    severe  . Oxygen deficiency   . Oxygen dependent    3L/[place]   . Peptic ulcer disease    dr Oletta Lamas  . Pneumonia, organism unspecified(486) 11/07/2010   Assoc with R Parapneumonic effusion 09/2010    - Tapped 10/05/10    -  CxR resolved 11/07/2010    . S/P jejunostomy Feb 2016 07/26/2014  . Thyroid nodule 2013   benign after biopsy w/ Dr. Redmond Baseman   Past Surgical History:  Procedure Laterality Date  . BIOPSY THYROID  08-13-11   benign nodule, per Dr. Melida Quitter   . CATARACT EXTRACTION W/ INTRAOCULAR LENS  IMPLANT, BILATERAL Bilateral   . CHOLECYSTECTOMY N/A 12/02/2017   Procedure: LAPAROSCOPIC CHOLECYSTECTOMY WITH INTRAOPERATIVE CHOLANGIOGRAM ERAS PATHWAY;  Surgeon: Johnathan Hausen, MD;  Location: [facility];  Service: General;  Laterality: N/A;  . COLONOSCOPY    . DILATION AND CURETTAGE OF UTERUS    . ESOPHAGOGASTRODUODENOSCOPY  12-29-09   dr qadeer at [facility], several gastric ulcers  . ESOPHAGOGASTRODUODENOSCOPY N/A 09/25/2012    Procedure: ESOPHAGOGASTRODUODENOSCOPY (EGD);  Surgeon: Beryle Beams, MD;  Location: [facility];  Service: Endoscopy;  Laterality: N/A;  . ESOPHAGOGASTRODUODENOSCOPY (EGD) WITH PROPOFOL N/A 06/27/2017   Procedure: ESOPHAGOGASTRODUODENOSCOPY (EGD) WITH PROPOFOL;  Surgeon: Carol Ada, MD;  Location: [facility];  Service: Endoscopy;  Laterality: N/A;  . FRACTURE SURGERY Left 2010   hip and leg s/p fall  . GASTROJEJUNOSTOMY  02/20/2008   Roux en Y gastrojejunsotomy w 1 foot Roux limb, laparotomy, takedown loop gastrojejunostomy with removal of Ntinol stent  . GASTROSTOMY N/A 07/26/2014   Procedure: LAPRASCOPIC ASSISTED OPEN PLACEMENT OF JEJUNOSTOMY TUBE;  Surgeon: Pedro Earls, MD;  Location: [facility];  Service: General;  Laterality: N/A;  . HERNIA REPAIR     "hiatal"  . JOINT REPLACEMENT    . LAPAROSCOPIC NISSEN FUNDOPLICATION  [id number]  . LAPAROSCOPIC PARTIAL GASTRECTOMY N/A 06/29/2013   Procedure: Gastrectomy and GJ Tube placement;  Surgeon: Pedro Earls, MD;  Location: [facility];  Service: General;  Laterality: N/A;  . ORIF FEMORAL NECK FRACTURE W/ DHS Right 03/2014  . ORIF HIP FRACTURE Left 2010  . REPAIR OF PERFORATED ULCER    . TONSILLECTOMY  1974  . TRUNCAL VAGOTOMY  12/2006   Laparoscopic enterolysis, laparoscopic Nissen's Fundoplication, laparoscopic truncal vagotomy & loop gastrojejunostomy    Family History  Problem Relation Age of Onset  . Cancer Mother        kidney/bladder cancer, magliant skin cancer on face, cervical cancer  . Stroke Mother   . Hypertension Mother   . Celiac disease Father   . Cancer Father        metastatic renal cell carcinoma, pancreatic cancer, colon polyps  . Colon cancer Maternal Uncle   . Breast cancer Neg Hx   . Ovarian cancer Neg Hx   . Endometrial cancer Neg Hx     SOCIAL HISTORY: Social History   Socioeconomic History  . Marital status: Divorced    Spouse name: Not on file  . Number of children: 0  . Years of education: Not on  file  . Highest education level: Not on file  Occupational History  . Not on file  Tobacco Use  . Smoking status: Current Every Day Smoker    Packs/day: 1.00    Years: 51.00    Pack years: 51.00    Types: Cigarettes  . Smokeless tobacco: Never Used  Substance and Sexual Activity  . Alcohol use: No    Alcohol/week: 0.0 standard drinks  . Drug use: No  . Sexual activity: Never  Other Topics Concern  . Not on file  Social History Narrative  . Not on file   Social Determinants of Health   Financial Resource Strain:   . Difficulty of Paying Living Expenses: Not on  file  Food Insecurity:   . Worried About Charity fundraiser in the Last Year: Not on file  . Ran Out of Food in the Last Year: Not on file  Transportation Needs:   . Lack of Transportation (Medical): Not on file  . Lack of Transportation (Non-Medical): Not on file  Physical Activity:   . Days of Exercise per Week: Not on file  . Minutes of Exercise per Session: Not on file  Stress:   . Feeling of Stress : Not on file  Social Connections:   . Frequency of Communication with Friends and Family: Not on file  . Frequency of Social Gatherings with Friends and Family: Not on file  . Attends Religious Services: Not on file  . Active Member of Clubs or Organizations: Not on file  . Attends Archivist Meetings: Not on file  . Marital Status: Not on file  Intimate Partner Violence:   . Fear of Current or Ex-Partner: Not on file  . Emotionally Abused: Not on file  . Physically Abused: Not on file  . Sexually Abused: Not on file    Allergies  Allergen Reactions  . Lithium Nausea And Vomiting  . Celebrex [Celecoxib] Other (See Comments)    History of bleeding ulcers  . Quetiapine Other (See Comments)     tardive dyskinesia  . Varenicline Tartrate Other (See Comments)    hallucinations  . Morphine Itching    Current Outpatient Medications  Medication Sig Dispense Refill  . acetaminophen (TYLENOL) 500  MG tablet Take 1 tablet (500 mg total) by mouth every 6 (six) hours as needed. 30 tablet 0  . albuterol (PROVENTIL) (5 MG/ML) 0.5% nebulizer solution Take 1 mL (5 mg total) by nebulization every 6 (six) hours as needed for wheezing or shortness of breath. 20 mL 0  . ALPRAZolam (XANAX PO) Take 1 tablet by mouth at bedtime.    . Aspirin-Acetaminophen (GOODYS BODY PAIN PO) Take 1-2 packets by mouth every 8 (eight) hours as needed (pain).     . busPIRone (BUSPAR) 10 MG tablet Take 10 mg by mouth daily.    . metoCLOPramide (REGLAN) 5 MG tablet Take 1 tablet (5 mg total) by mouth 4 (four) times daily -  before meals and at bedtime. 120 tablet 0  . ondansetron (ZOFRAN-ODT) 4 MG disintegrating tablet Take 4 mg by mouth 2 (two) times daily as needed for nausea/vomiting.    Marland Kitchen oxyCODONE (OXY IR/ROXICODONE) 5 MG immediate release tablet Take 1 tablet (5 mg total) by mouth every 6 (six) hours as needed for severe pain. 20 tablet 0  . PROAIR HFA 108 (90 Base) MCG/ACT inhaler Inhale 1 puff into the lungs every 4 (four) hours as needed for wheezing or shortness of breath.     . Vitamin D, Ergocalciferol, (DRISDOL) 1.25 MG (50000 UT) CAPS capsule Take 1 capsule (50,000 Units total) by mouth every 7 (seven) days. 12 capsule 0  . zolpidem (AMBIEN) 5 MG tablet Take 5 mg by mouth at bedtime.     No current facility-administered medications for this visit.    ROS:   General:  No weight loss, Fever, chills  HEENT: No recent headaches, no nasal bleeding, no visual changes, no sore throat  Neurologic: No dizziness, blackouts, seizures. No recent symptoms of stroke or mini- stroke. No recent episodes of slurred speech, or temporary blindness.  Cardiac: No recent episodes of chest pain/pressure, no shortness of breath at rest.  + shortness of breath with exertion.  Denies history of atrial fibrillation or irregular heartbeat  Vascular: No history of rest pain in feet.  No history of claudication.  No history of  non-healing ulcer, No history of DVT   Pulmonary: No home oxygen, no productive cough, no hemoptysis,  No asthma or wheezing  Musculoskeletal:  [ ]  Arthritis, [ ]  Low back pain,  [ ]  Joint pain  Hematologic:No history of hypercoagulable state.  No history of easy bleeding.  No history of anemia  Gastrointestinal: No hematochezia or melena,  No gastroesophageal reflux, no trouble swallowing  Urinary: [ ]  chronic Kidney disease, [ ]  on HD - [ ]  MWF or [ ]  TTHS, [ ]  Burning with urination, [ ]  Frequent urination, [ ]  Difficulty urinating;   Skin: No rashes  Psychological: No history of anxiety,  No history of depression   Physical Examination  Vitals:   08/04/19 1334  BP: 100/64  Pulse: 89  Resp: 14  Temp: (!) 97 F (36.1 C)  TempSrc: Temporal  SpO2: 99%  Weight: 90 lb (40.8 kg)  Height: 5\' 3"  (1.6 m)    Body mass index is 15.94 kg/m.  General:  Alert and oriented, no acute distress HEENT: Normal Neck: No JVD Cardiac: Regular Rate and Rhythm  Abdomen: Soft, non-tender, non-distended, multiple burn areas from heating pad all chronic abdomen Skin: No rash Extremity Pulses:  2+ radial, brachial, femoral, absent popliteal dorsalis pedis, posterior tibial pulses bilaterally Musculoskeletal: No deformity trace pretibial edema bilaterally  Neurologic: Upper and lower extremity motor 5/5 and symmetric  DATA:  Patient had a venous reflux exam today which showed no evidence of DVT.  She had no reflux in the left lower extremity.  She did have some reflux at the right saphenofemoral junction but the vein below this was only 3 mm in diameter with no significant reflux.  ASSESSMENT: Bilateral chronic leg swelling probably multifactorial with combination of nutritional status renal function and a mild component of reflux.  Patient also has been keeping her legs in a dependent position almost all day long.   PLAN: She was encouraged to continue wearing her compression stockings as  these have helped her significantly.  I have also encouraged her to increase her activity get out of bed and move around.  Her peripheral arterial disease currently she has ABIs that are reasonable and she does not really describe claudication symptoms.  She is currently followed by Dr. Einar Gip for this.  I discussed with her protecting her feet and if she gets any wound cut blister or sore on her foot certainly to contact Dr. Nadyne Coombes or [place].   Ruta Hinds, MD [facility] Office: [phone] Pager: [phone]

## 2019-08-06 ENCOUNTER — Encounter: Payer: Medicare Other | Admitting: Vascular Surgery

## 2019-08-06 ENCOUNTER — Encounter (HOSPITAL_COMMUNITY): Payer: Medicare Other

## 2019-08-14 ENCOUNTER — Ambulatory Visit: Payer: Medicare Other | Attending: Internal Medicine

## 2019-08-14 DIAGNOSIS — Z23 Encounter for immunization: Secondary | ICD-10-CM | POA: Insufficient documentation

## 2019-08-14 NOTE — Progress Notes (Signed)
   Covid-19 Vaccination Clinic  Name:  [name]    MRN: [id number] DOB: [birth year]  08/14/2019  [name] was observed post Covid-19 immunization for 15 minutes without incidence. She was provided with Vaccine Information Sheet and instruction to access the V-Safe system.   [name] was instructed to call 911 with any severe reactions post vaccine: Marland Kitchen Difficulty breathing  . Swelling of your face and throat  . A fast heartbeat  . A bad rash all over your body  . Dizziness and weakness    Immunizations Administered    Name Date Dose VIS Date Route   Pfizer COVID-19 Vaccine 08/14/2019  3:45 PM 0.3 mL 06/04/2019 Intramuscular   Manufacturer: [place]   Lot: [id number]   [place]: [id number]

## 2019-08-21 ENCOUNTER — Inpatient Hospital Stay (HOSPITAL_COMMUNITY)
Admission: EM | Admit: 2019-08-21 | Discharge: 2019-08-26 | DRG: 187 | Disposition: A | Discharge status: Skilled Nursing Facility | Payer: Medicare Other | Attending: Internal Medicine | Admitting: Internal Medicine

## 2019-08-21 ENCOUNTER — Other Ambulatory Visit: Payer: Self-pay

## 2019-08-21 ENCOUNTER — Emergency Department (HOSPITAL_COMMUNITY): Payer: Medicare Other

## 2019-08-21 DIAGNOSIS — J439 Emphysema, unspecified: Secondary | ICD-10-CM | POA: Diagnosis present

## 2019-08-21 DIAGNOSIS — J90 Pleural effusion, not elsewhere classified: Principal | ICD-10-CM | POA: Diagnosis present

## 2019-08-21 DIAGNOSIS — Z808 Family history of malignant neoplasm of other organs or systems: Secondary | ICD-10-CM

## 2019-08-21 DIAGNOSIS — Z823 Family history of stroke: Secondary | ICD-10-CM

## 2019-08-21 DIAGNOSIS — Z800 Family history of malignant neoplasm of digestive organs: Secondary | ICD-10-CM

## 2019-08-21 DIAGNOSIS — F329 Major depressive disorder, single episode, unspecified: Secondary | ICD-10-CM | POA: Diagnosis present

## 2019-08-21 DIAGNOSIS — Z8051 Family history of malignant neoplasm of kidney: Secondary | ICD-10-CM

## 2019-08-21 DIAGNOSIS — Z8049 Family history of malignant neoplasm of other genital organs: Secondary | ICD-10-CM

## 2019-08-21 DIAGNOSIS — R7401 Elevation of levels of liver transaminase levels: Secondary | ICD-10-CM

## 2019-08-21 DIAGNOSIS — Z9842 Cataract extraction status, left eye: Secondary | ICD-10-CM

## 2019-08-21 DIAGNOSIS — R339 Retention of urine, unspecified: Secondary | ICD-10-CM | POA: Diagnosis present

## 2019-08-21 DIAGNOSIS — R600 Localized edema: Secondary | ICD-10-CM

## 2019-08-21 DIAGNOSIS — Z8249 Family history of ischemic heart disease and other diseases of the circulatory system: Secondary | ICD-10-CM

## 2019-08-21 DIAGNOSIS — E875 Hyperkalemia: Secondary | ICD-10-CM | POA: Diagnosis present

## 2019-08-21 DIAGNOSIS — Z20822 Contact with and (suspected) exposure to covid-19: Secondary | ICD-10-CM | POA: Diagnosis present

## 2019-08-21 DIAGNOSIS — R945 Abnormal results of liver function studies: Secondary | ICD-10-CM

## 2019-08-21 DIAGNOSIS — F411 Generalized anxiety disorder: Secondary | ICD-10-CM | POA: Diagnosis present

## 2019-08-21 DIAGNOSIS — H3530 Unspecified macular degeneration: Secondary | ICD-10-CM | POA: Diagnosis present

## 2019-08-21 DIAGNOSIS — I2510 Atherosclerotic heart disease of native coronary artery without angina pectoris: Secondary | ICD-10-CM | POA: Diagnosis present

## 2019-08-21 DIAGNOSIS — G8929 Other chronic pain: Secondary | ICD-10-CM | POA: Diagnosis present

## 2019-08-21 DIAGNOSIS — M7989 Other specified soft tissue disorders: Secondary | ICD-10-CM

## 2019-08-21 DIAGNOSIS — R413 Other amnesia: Secondary | ICD-10-CM | POA: Diagnosis present

## 2019-08-21 DIAGNOSIS — I70202 Unspecified atherosclerosis of native arteries of extremities, left leg: Secondary | ICD-10-CM | POA: Diagnosis present

## 2019-08-21 DIAGNOSIS — E8770 Fluid overload, unspecified: Secondary | ICD-10-CM | POA: Diagnosis present

## 2019-08-21 DIAGNOSIS — R0902 Hypoxemia: Secondary | ICD-10-CM | POA: Diagnosis present

## 2019-08-21 DIAGNOSIS — G4700 Insomnia, unspecified: Secondary | ICD-10-CM | POA: Diagnosis present

## 2019-08-21 DIAGNOSIS — R7989 Other specified abnormal findings of blood chemistry: Secondary | ICD-10-CM

## 2019-08-21 DIAGNOSIS — K529 Noninfective gastroenteritis and colitis, unspecified: Secondary | ICD-10-CM | POA: Diagnosis present

## 2019-08-21 DIAGNOSIS — R188 Other ascites: Secondary | ICD-10-CM | POA: Diagnosis present

## 2019-08-21 DIAGNOSIS — J9811 Atelectasis: Secondary | ICD-10-CM | POA: Diagnosis present

## 2019-08-21 DIAGNOSIS — E876 Hypokalemia: Secondary | ICD-10-CM | POA: Diagnosis not present

## 2019-08-21 DIAGNOSIS — I248 Other forms of acute ischemic heart disease: Secondary | ICD-10-CM | POA: Diagnosis present

## 2019-08-21 DIAGNOSIS — Z9049 Acquired absence of other specified parts of digestive tract: Secondary | ICD-10-CM

## 2019-08-21 DIAGNOSIS — F17210 Nicotine dependence, cigarettes, uncomplicated: Secondary | ICD-10-CM | POA: Diagnosis present

## 2019-08-21 DIAGNOSIS — R918 Other nonspecific abnormal finding of lung field: Secondary | ICD-10-CM | POA: Diagnosis present

## 2019-08-21 DIAGNOSIS — Z79899 Other long term (current) drug therapy: Secondary | ICD-10-CM

## 2019-08-21 DIAGNOSIS — K760 Fatty (change of) liver, not elsewhere classified: Secondary | ICD-10-CM | POA: Diagnosis present

## 2019-08-21 DIAGNOSIS — D539 Nutritional anemia, unspecified: Secondary | ICD-10-CM | POA: Diagnosis present

## 2019-08-21 DIAGNOSIS — J449 Chronic obstructive pulmonary disease, unspecified: Secondary | ICD-10-CM | POA: Diagnosis present

## 2019-08-21 DIAGNOSIS — Z8371 Family history of colonic polyps: Secondary | ICD-10-CM

## 2019-08-21 DIAGNOSIS — E871 Hypo-osmolality and hyponatremia: Secondary | ICD-10-CM

## 2019-08-21 DIAGNOSIS — Z8379 Family history of other diseases of the digestive system: Secondary | ICD-10-CM

## 2019-08-21 DIAGNOSIS — Z9981 Dependence on supplemental oxygen: Secondary | ICD-10-CM

## 2019-08-21 DIAGNOSIS — R778 Other specified abnormalities of plasma proteins: Secondary | ICD-10-CM

## 2019-08-21 DIAGNOSIS — H548 Legal blindness, as defined in USA: Secondary | ICD-10-CM | POA: Diagnosis present

## 2019-08-21 DIAGNOSIS — K2270 Barrett's esophagus without dysplasia: Secondary | ICD-10-CM | POA: Diagnosis present

## 2019-08-21 DIAGNOSIS — K219 Gastro-esophageal reflux disease without esophagitis: Secondary | ICD-10-CM | POA: Diagnosis present

## 2019-08-21 DIAGNOSIS — E8809 Other disorders of plasma-protein metabolism, not elsewhere classified: Secondary | ICD-10-CM | POA: Diagnosis present

## 2019-08-21 DIAGNOSIS — E8779 Other fluid overload: Secondary | ICD-10-CM

## 2019-08-21 DIAGNOSIS — M810 Age-related osteoporosis without current pathological fracture: Secondary | ICD-10-CM | POA: Diagnosis present

## 2019-08-21 DIAGNOSIS — Z961 Presence of intraocular lens: Secondary | ICD-10-CM | POA: Diagnosis present

## 2019-08-21 DIAGNOSIS — R296 Repeated falls: Secondary | ICD-10-CM | POA: Diagnosis present

## 2019-08-21 DIAGNOSIS — Z8052 Family history of malignant neoplasm of bladder: Secondary | ICD-10-CM

## 2019-08-21 DIAGNOSIS — I708 Atherosclerosis of other arteries: Secondary | ICD-10-CM | POA: Diagnosis present

## 2019-08-21 DIAGNOSIS — Z79891 Long term (current) use of opiate analgesic: Secondary | ICD-10-CM

## 2019-08-21 DIAGNOSIS — N179 Acute kidney failure, unspecified: Secondary | ICD-10-CM

## 2019-08-21 DIAGNOSIS — R531 Weakness: Secondary | ICD-10-CM

## 2019-08-21 DIAGNOSIS — Z9841 Cataract extraction status, right eye: Secondary | ICD-10-CM

## 2019-08-21 DIAGNOSIS — Z681 Body mass index (BMI) 19 or less, adult: Secondary | ICD-10-CM

## 2019-08-21 DIAGNOSIS — I739 Peripheral vascular disease, unspecified: Secondary | ICD-10-CM

## 2019-08-21 DIAGNOSIS — R627 Adult failure to thrive: Secondary | ICD-10-CM

## 2019-08-21 LAB — COMPREHENSIVE METABOLIC PANEL
ALT: 60 U/L — ABNORMAL HIGH (ref 0–44)
AST: 97 U/L — ABNORMAL HIGH (ref 15–41)
Albumin: 1.7 g/dL — ABNORMAL LOW (ref 3.5–5.0)
Alkaline Phosphatase: 208 U/L — ABNORMAL HIGH (ref 38–126)
Anion gap: 15 (ref 5–15)
BUN: 50 mg/dL — ABNORMAL HIGH (ref 8–23)
CO2: 25 mmol/L (ref 22–32)
Calcium: 7.3 mg/dL — ABNORMAL LOW (ref 8.9–10.3)
Chloride: 90 mmol/L — ABNORMAL LOW (ref 98–111)
Creatinine, Ser: 2.1 mg/dL — ABNORMAL HIGH (ref 0.44–1.00)
GFR calc Af Amer: 28 mL/min — ABNORMAL LOW (ref >60)
GFR calc non Af Amer: 24 mL/min — ABNORMAL LOW (ref >60)
Glucose, Bld: 118 mg/dL — ABNORMAL HIGH (ref 70–99)
Potassium: 2.5 mmol/L — CL (ref 3.5–5.1)
Sodium: 130 mmol/L — ABNORMAL LOW (ref 135–145)
Total Bilirubin: 0.9 mg/dL (ref 0.3–1.2)
Total Protein: 4.1 g/dL — ABNORMAL LOW (ref 6.5–8.1)

## 2019-08-21 LAB — CBC WITH DIFFERENTIAL/PLATELET
Abs Immature Granulocytes: 0.07 10*3/uL (ref 0.00–0.07)
Basophils Absolute: 0 10*3/uL (ref 0.0–0.1)
Basophils Relative: 0 %
Eosinophils Absolute: 0 10*3/uL (ref 0.0–0.5)
Eosinophils Relative: 0 %
HCT: 41.4 % (ref 36.0–46.0)
Hemoglobin: 14.1 g/dL (ref 12.0–15.0)
Immature Granulocytes: 1 %
Lymphocytes Relative: 7 %
Lymphs Abs: 0.9 10*3/uL (ref 0.7–4.0)
MCH: 32.3 pg (ref 26.0–34.0)
MCHC: 34.1 g/dL (ref 30.0–36.0)
MCV: 95 fL (ref 80.0–100.0)
Monocytes Absolute: 0.5 10*3/uL (ref 0.1–1.0)
Monocytes Relative: 4 %
Neutro Abs: 11.1 10*3/uL — ABNORMAL HIGH (ref 1.7–7.7)
Neutrophils Relative %: 88 %
Platelets: 273 10*3/uL (ref 150–400)
RBC: 4.36 MIL/uL (ref 3.87–5.11)
RDW: 15.4 % (ref 11.5–15.5)
WBC: 12.6 10*3/uL — ABNORMAL HIGH (ref 4.0–10.5)
nRBC: 0 % (ref 0.0–0.2)

## 2019-08-21 LAB — BRAIN NATRIURETIC PEPTIDE: B Natriuretic Peptide: 174.3 pg/mL — ABNORMAL HIGH (ref 0.0–100.0)

## 2019-08-21 LAB — MAGNESIUM: Magnesium: 2.1 mg/dL (ref 1.7–2.4)

## 2019-08-21 LAB — TROPONIN I (HIGH SENSITIVITY): Troponin I (High Sensitivity): 147 ng/L (ref <18)

## 2019-08-21 MED ORDER — DIPHENHYDRAMINE HCL 25 MG PO CAPS
25.0000 mg | ORAL_CAPSULE | Freq: Once | ORAL | Status: AC
  Administered 2019-08-21: 25 mg via ORAL
  Filled 2019-08-21: qty 1

## 2019-08-21 MED ORDER — POTASSIUM CHLORIDE CRYS ER 20 MEQ PO TBCR
40.0000 meq | EXTENDED_RELEASE_TABLET | Freq: Once | ORAL | Status: AC
  Administered 2019-08-21: 40 meq via ORAL
  Filled 2019-08-21: qty 2

## 2019-08-21 MED ORDER — OXYCODONE-ACETAMINOPHEN 5-325 MG PO TABS
1.0000 | ORAL_TABLET | Freq: Once | ORAL | Status: AC
  Administered 2019-08-21: 1 via ORAL
  Filled 2019-08-21: qty 1

## 2019-08-21 MED ORDER — POTASSIUM CHLORIDE 10 MEQ/100ML IV SOLN
10.0000 meq | INTRAVENOUS | Status: AC
  Administered 2019-08-21 – 2019-08-22 (×5): 10 meq via INTRAVENOUS
  Filled 2019-08-21 (×5): qty 100

## 2019-08-21 MED ORDER — SODIUM CHLORIDE 0.9 % IV BOLUS: 500.0000 mL | Freq: Once | INTRAVENOUS | Status: DC

## 2019-08-21 NOTE — ED Provider Notes (Signed)
[name] Provider Note   CSN: [id number] Arrival date & time: 08/21/19  1927     History Chief Complaint  Patient presents with  . Leg Swelling    [name] is a 69 y.o. female of emphysema/COPD, chronic abdominal pain, anxiety, history of chronic leg edema, gastroparesis.   HPI Patient is a 69 year old female with chief complaint of bilateral leg swelling and shortness of breath.  Patient states that she has emphysema uses 3 to 4 L of oxygen at baseline via nasal cannula.  She states that for the past several weeks she has been noticing some lower extremity swelling, tingling and discomfort in her legs.  She states she has memory problems are not entirely sure exactly how long has been going on for.  She is furthermore unsure what medication she is taking but states that her friend [name] will be able to explain more of this.  She denies any chest pain, nausea, vomiting.  She endorses abdominal pain but on further questioning states that this is unchanged from her usual abdominal pain.    [name] (friend) Patient is on 0.5 once daily bumetanide.  Can't see--chronic problem; she has chronic memory problems. Several months of leg pain/swollen. Harder to walk--she doesn't want to. Is able to walk at home with a walker but baseline is difficult to ambulate.    My review of EMR patient had bilateral lower extremity DVT study done 08/03/2019 with no evidence of DVT or venous reflux.      Past Medical History:  Diagnosis Date  . Acute duodenal ulcer with hemorrhage and perforation, with obstruction ([place]) 06/26/2004   multiple pyloric perferations  . Allergy   . Anemia   . Anxiety    sees [name] NP at Dr. Radonna Ricker office  . Asthma    hx of   . Barrett's esophagus   . Chronic abdominal pain    narcotic dependence, dr gyarteng-dak at [facility]  . 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  Complication of anesthesia    pt states had too much xanax on board - agitated   . COPD (chronic obstructive pulmonary disease) ([place])   . Depression   . Exertional shortness of breath   . Fall 11/23/2014   frequent falls  . Fx of fibula 07-28-11   left fibula, 2 places   . Fx two ribs-open 08-24-11   left 4th and 5th  . GASTRIC OUTLET OBSTRUCTION 08/28/2007   In July 2008 she underwent laparoscopic enterolysis, Nissen fundoplication over a #35 bougie, single pledgeted suture colosure of the hiatus and a 3 suture wrap.  Lap truncal vagotomy and a loop gastrojejunostomy was performed.  This was revised in August of 2009 to a roux en Y gastrojejujostomy.     . Gastroparesis 06/03/2007   Qualifier: Diagnosis of  By: [name] MD, [name]   . GERD (gastroesophageal reflux disease)   . History of blood transfusion    "related to OR; maybe when I perforated that ulcer"  . History of hiatal hernia   . Lap Nissen + truncal vagotomy July 2008 05/13/2013  . LEG EDEMA 01/19/2008   Qualifier: Diagnosis of  By: [name] MD, [name]   . Macular degeneration, bilateral    early onset, severe, legally blind and worsening. Followed by dr. Katy Fitch  . MENOPAUSE 01/07/2007   Qualifier: Diagnosis of  By: [name], CMA, [place]    . Narcotic abuse ([place])   .  Osteoporosis    severe  . Oxygen deficiency   . Oxygen dependent    3L/[place]   . Peptic ulcer disease    dr Oletta Lamas  . Pneumonia, organism unspecified(486) 11/07/2010   Assoc with R Parapneumonic effusion 09/2010    - Tapped 10/05/10    - CxR resolved 11/07/2010    . S/P jejunostomy Feb 2016 07/26/2014  . Thyroid nodule 2013   benign after biopsy w/ Dr. Redmond Baseman    Patient Active Problem List   Diagnosis Date Noted  . Abdominal pain, chronic, epigastric 05/31/2017  . Episodic polysubstance dependence ([place]) 05/31/2017  . At high risk for injury related to fall 05/18/2017  . Chronic nausea 05/18/2017  . Poor compliance with medication 11/20/2016  . Insomnia 11/20/2016  .  Early onset macular degeneration 11/20/2016  . Legally blind 11/20/2016  . Abdominal pain   . Hypoxia 04/08/2015  . Opioid type dependence, continuous ([place])   . Protein-calorie malnutrition ([place])   . Episode of recurrent major depressive disorder ([place])   . Secondary hyperparathyroidism ([place]) 01/10/2015  . Vitamin D deficiency 01/10/2015  . 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 Anemia, iron deficiency 11/03/2014  . Chronic pain syndrome 10/24/2014  . Osteoporosis 10/05/2014  . Vertebral compression fracture ([place]) 10/05/2014  . Coronary artery disease due to calcified coronary lesion 10/05/2014  . Weight loss   . Tobacco abuse   . History of subtotal gastrectomy with Roux-en-Y recontruction   . Orthostatic hypotension 08/11/2014  . Memory loss, short term 07/18/2014  . Constipation, chronic 07/10/2013  . Chronic abdominal pain   . COPD (chronic obstructive pulmonary disease) ([place]) 11/07/2010  . Major depressive disorder, recurrent episode, moderate ([place]) 06/06/2010  . 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 Anxiety state 01/07/2007  . Barrett's esophagus 07/03/2005    Past Surgical History:  Procedure Laterality Date  . BIOPSY THYROID  08-13-11   benign nodule, per Dr. Melida Quitter   . CATARACT EXTRACTION W/ INTRAOCULAR LENS  IMPLANT, BILATERAL Bilateral   . CHOLECYSTECTOMY N/A 12/02/2017   Procedure: LAPAROSCOPIC CHOLECYSTECTOMY WITH INTRAOPERATIVE CHOLANGIOGRAM ERAS PATHWAY;  Surgeon: Johnathan Hausen, MD;  Location: [facility];  Service: General;  Laterality: N/A;  . COLONOSCOPY    . DILATION AND CURETTAGE OF UTERUS    . ESOPHAGOGASTRODUODENOSCOPY  12-29-09   dr qadeer at [facility], several gastric ulcers  . ESOPHAGOGASTRODUODENOSCOPY N/A 09/25/2012   Procedure: ESOPHAGOGASTRODUODENOSCOPY (EGD);  Surgeon: Beryle Beams, MD;  Location: [facility];  Service: Endoscopy;  Laterality: N/A;  . ESOPHAGOGASTRODUODENOSCOPY (EGD)  WITH PROPOFOL N/A 06/27/2017   Procedure: ESOPHAGOGASTRODUODENOSCOPY (EGD) WITH PROPOFOL;  Surgeon: Carol Ada, MD;  Location: [facility];  Service: Endoscopy;  Laterality: N/A;  . FRACTURE SURGERY Left 2010   hip and leg s/p fall  . GASTROJEJUNOSTOMY  02/20/2008   Roux en Y gastrojejunsotomy w 1 foot Roux limb, laparotomy, takedown loop gastrojejunostomy with removal of Ntinol stent  . GASTROSTOMY N/A 07/26/2014   Procedure: LAPRASCOPIC ASSISTED OPEN PLACEMENT OF JEJUNOSTOMY TUBE;  Surgeon: Pedro Earls, MD;  Location: [facility];  Service: General;  Laterality: N/A;  . HERNIA REPAIR     "hiatal"  . JOINT REPLACEMENT    . LAPAROSCOPIC NISSEN FUNDOPLICATION  7829  . LAPAROSCOPIC PARTIAL GASTRECTOMY N/A 06/29/2013   Procedure: Gastrectomy and GJ Tube placement;  Surgeon: Pedro Earls, MD;  Location: [facility];  Service: General;  Laterality: N/A;  . ORIF FEMORAL NECK FRACTURE W/ DHS Right 03/2014  .  ORIF HIP FRACTURE Left 2010  . REPAIR OF PERFORATED ULCER    . TONSILLECTOMY  1974  . TRUNCAL VAGOTOMY  12/2006   Laparoscopic enterolysis, laparoscopic Nissen's Fundoplication, laparoscopic truncal vagotomy & loop gastrojejunostomy     OB History   No obstetric history on file.     Family History  Problem Relation Age of Onset  . Cancer Mother        kidney/bladder cancer, magliant skin cancer on face, cervical cancer  . Stroke Mother   . Hypertension Mother   . Celiac disease Father   . Cancer Father        metastatic renal cell carcinoma, pancreatic cancer, colon polyps  . Colon cancer Maternal Uncle   . Breast cancer Neg Hx   . Ovarian cancer Neg Hx   . Endometrial cancer Neg Hx     Social History   Tobacco Use  . Smoking status: Current Every Day Smoker    Packs/day: 1.00    Years: 51.00    Pack years: 51.00    Types: Cigarettes  . Smokeless tobacco: Never Used  Substance Use Topics  . Alcohol use: No    Alcohol/week: 0.0 standard drinks  . Drug use: No    Home  Medications Prior to Admission medications   Medication Sig Start Date End Date Taking? Authorizing Provider  acetaminophen (TYLENOL) 500 MG tablet Take 1 tablet (500 mg total) by mouth every 6 (six) hours as needed. 06/08/19  Yes Maximiano Coss, NP  albuterol (PROVENTIL) (5 MG/ML) 0.5% nebulizer solution Take 1 mL (5 mg total) by nebulization every 6 (six) hours as needed for wheezing or shortness of breath. 08/05/18  Yes Carlisle Cater, PA-C  ALPRAZolam (XANAX PO) Take 1 tablet by mouth at bedtime as needed (anxiety, sleep).    Yes [provider]  Aspirin-Acetaminophen (GOODYS BODY PAIN PO) Take 1-2 packets by mouth every 8 (eight) hours as needed (pain).    Yes [provider]  bumetanide (BUMEX) 0.5 MG tablet Take 0.5 mg by mouth daily. 08/17/19  Yes [provider]  busPIRone (BUSPAR) 10 MG tablet Take 10 mg by mouth daily. 09/06/18  Yes [provider]  cyclobenzaprine (FLEXERIL) 10 MG tablet Take 10 mg by mouth 3 (three) times daily as needed. 08/16/19  Yes [provider]  etodolac (LODINE XL) 400 MG 24 hr tablet Take 400 mg by mouth daily as needed. 08/16/19  Yes [provider]  gabapentin (NEURONTIN) 100 MG capsule Take 200 mg by mouth 3 (three) times daily. 08/16/19  Yes [provider]  ondansetron (ZOFRAN-ODT) 8 MG disintegrating tablet Take 8 mg by mouth 3 (three) times daily as needed. 08/16/19  Yes [provider]  oxyCODONE-acetaminophen (PERCOCET/ROXICET) 5-325 MG tablet Take 1 tablet by mouth 5 (five) times daily as needed. 08/16/19  Yes [provider]  Potassium Chloride ER 20 MEQ TBCR Take 20 mEq by mouth daily. 08/16/19  Yes [provider]  PROAIR HFA 108 (90 Base) MCG/ACT inhaler Inhale 1 puff into the lungs every 4 (four) hours as needed for wheezing or shortness of breath.  08/06/18  Yes [provider]  Vitamin D, Ergocalciferol, (DRISDOL) 1.25 MG (50000 UT) CAPS capsule Take 1  capsule (50,000 Units total) by mouth every 7 (seven) days. 06/01/18  Yes Rutherford Guys, MD  zolpidem (AMBIEN) 5 MG tablet Take 5 mg by mouth at bedtime as needed for sleep.  07/20/18  Yes [provider]  metoCLOPramide (REGLAN) 5 MG  tablet Take 1 tablet (5 mg total) by mouth 4 (four) times daily -  before meals and at bedtime. 08/18/18   Shawnee Knapp, MD  oxyCODONE (OXY IR/ROXICODONE) 5 MG immediate release tablet Take 1 tablet (5 mg total) by mouth every 6 (six) hours as needed for severe pain. 12/02/17   Johnathan Hausen, MD    Allergies    Lithium, Celebrex [celecoxib], Quetiapine, Varenicline tartrate, and Morphine  Review of Systems   Review of Systems  Constitutional: Negative for chills and fever.  HENT: Negative for congestion.   Eyes: Negative for pain.  Respiratory: Positive for shortness of breath. Negative for cough.   Cardiovascular: Negative for chest pain and leg swelling.  Gastrointestinal: Negative for abdominal pain, diarrhea, nausea and vomiting.  Genitourinary: Negative for dysuria.  Musculoskeletal: Positive for myalgias.  Skin: Negative for rash.  Neurological: Negative for dizziness and headaches.    Physical Exam Updated Vital Signs BP 102/84   Pulse 98   Temp 98.5 F (36.9 C) (Oral)   Resp 20   Ht 5' 3.5" (1.613 m)   Wt 39.5 kg   SpO2 91%   BMI 15.17 kg/m   Physical Exam Vitals and nursing note reviewed.  Constitutional:      General: She is not in acute distress.    Comments: Patient is frail 69 year old female appears older than stated age.  She does not appear to be in acute distress but does appear uncomfortable laying in bed.  She is nontoxic-appearing and nondiaphoretic.  HENT:     Head: Normocephalic and atraumatic.     Nose: Nose normal.     Mouth/Throat:     Mouth: Mucous membranes are moist.  Eyes:     General: No scleral icterus. Neck:     Comments: No notable JVD Cardiovascular:     Rate and Rhythm: Normal rate and  regular rhythm.     Pulses: Normal pulses.     Heart sounds: Normal heart sounds.  Pulmonary:     Effort: Pulmonary effort is normal. No respiratory distress.     Breath sounds: No wheezing.     Comments: Some mild decreased lung sounds in the bases.  No crackles. Abdominal:     Palpations: Abdomen is soft.     Tenderness: There is no abdominal tenderness. There is no right CVA tenderness, left CVA tenderness, guarding or rebound.  Musculoskeletal:     Cervical back: Normal range of motion and neck supple.     Right lower leg: Edema present.     Left lower leg: Edema present.     Comments: Bilateral lower extremity edema to the hips.  Skin:    General: Skin is warm and dry.     Capillary Refill: Capillary refill takes less than 2 seconds.  Neurological:     Mental Status: She is alert. Mental status is at baseline.  Psychiatric:        Mood and Affect: Mood normal.        Behavior: Behavior normal.     ED Results / Procedures / Treatments   Labs (all labs ordered are listed, but only abnormal results are displayed) Labs Reviewed  COMPREHENSIVE METABOLIC PANEL - Abnormal; Notable for the following components:      Result Value   Sodium 130 (*)    Potassium 2.5 (*)    Chloride 90 (*)    Glucose, Bld 118 (*)    BUN 50 (*)    Creatinine, Ser 2.10 (*)  Calcium 7.3 (*)    Total Protein 4.1 (*)    Albumin 1.7 (*)    AST 97 (*)    ALT 60 (*)    Alkaline Phosphatase 208 (*)    GFR calc non Af Amer 24 (*)    GFR calc Af Amer 28 (*)    All other components within normal limits  CBC WITH DIFFERENTIAL/PLATELET - Abnormal; Notable for the following components:   WBC 12.6 (*)    Neutro Abs 11.1 (*)    All other components within normal limits  BRAIN NATRIURETIC PEPTIDE - Abnormal; Notable for the following components:   B Natriuretic Peptide 174.3 (*)    All other components within normal limits  BLOOD GAS, VENOUS - Abnormal; Notable for the following components:   pH, Ven  7.463 (*)    pCO2, Ven 39.9 (*)    pO2, Ven <31.0 (*)    Bicarbonate 28.2 (*)    Acid-Base Excess 4.5 (*)    All other components within normal limits  TROPONIN I (HIGH SENSITIVITY) - Abnormal; Notable for the following components:   Troponin I (High Sensitivity) 147 (*)    All other components within normal limits  SARS CORONAVIRUS 2 (TAT 6-24 HRS)  MAGNESIUM  URINALYSIS, ROUTINE W REFLEX MICROSCOPIC    EKG EKG Interpretation  Date/Time:  Sunday August 22 2019 00:05:24 EST Ventricular Rate:  91 PR Interval:    QRS Duration: 101 QT Interval:  321 QTC Calculation: 395 R Axis:   51 Text Interpretation: Sinus rhythm Probable left atrial enlargement Anteroseptal infarct, age indeterminate No significant change was found Confirmed by Shanon Rosser [phone]) on 08/22/2019 12:16:52 AM   Radiology DG Chest 2 View  Result Date: 08/21/2019 CLINICAL DATA:  69 year old female with shortness of breath. EXAM: CHEST - 2 VIEW COMPARISON:  Chest radiograph dated 07/25/2019. FINDINGS: Small bilateral pleural effusions with bibasilar atelectasis. Infiltrate is not excluded. Clinical correlation is recommended. Background of emphysema. No pneumothorax. The cardiac silhouette is within normal limits. Atherosclerotic calcification of the aorta. No acute osseous pathology. IMPRESSION: Small bilateral pleural effusions and bibasilar atelectasis versus infiltrate. Electronically Signed   By: Anner Crete M.D.   On: 08/21/2019 20:56    Procedures .Critical Care Performed by: Tedd Sias, PA Authorized by: Tedd Sias, PA   Critical care provider statement:    Critical care time (minutes):  35   Critical care time was exclusive of:  Separately billable procedures and treating other patients and teaching time   Critical care was necessary to treat or prevent imminent or life-threatening deterioration of the following conditions: Severe hypokalemia and new acute renal failure with elevated  troponin.   Critical care was time spent personally by me on the following activities:  Discussions with consultants, evaluation of patient's response to treatment, examination of patient, review of old charts, re-evaluation of patient's condition, pulse oximetry, ordering and review of radiographic studies, ordering and review of laboratory studies and ordering and performing treatments and interventions   I assumed direction of critical care for this patient from another provider in my specialty: no     (including critical care time)  Medications Ordered in ED Medications  potassium chloride 10 mEq in 100 mL IVPB (10 mEq Intravenous New Bag/Given 08/22/19 0013)  oxyCODONE-acetaminophen (PERCOCET/ROXICET) 5-325 MG per tablet 1 tablet (1 tablet Oral Given 08/21/19 2206)  diphenhydrAMINE (BENADRYL) capsule 25 mg (25 mg Oral Given 08/21/19 2206)  potassium chloride SA (KLOR-CON) CR tablet 40 mEq (40 mEq  Oral Given 08/21/19 2316)    ED Course  I have reviewed the triage vital signs and the nursing notes.  Pertinent labs & imaging results that were available during my care of the patient were reviewed by me and considered in my medical decision making (see chart for details).  Patient with several months of lower extremity edema presented today for same complaints although she is endorsing some shortness of breath and fatigue as well.  She states that she is having more more difficulty walking around.  Clinical Course as of Aug 22 15  Sat Aug 21, 2019  2252 EKG independently read by myself with no acute ischemia.  No acute abnormalities.  No U waves or peaked T waves.   [WF]  2252 Chest x-ray independently reviewed by myself.  There appears to be some dulling of the diaphragmatic angles consistent with small bilateral pleural effusions.  This is consistent with patient's new onset kidney failure.  She appears to be third spacing somewhat.  Radiologist read. Small bilateral pleural effusions and  bibasilar atelectasis versus infiltrate.   [WF]    Clinical Course User Index [WF] Pati Gallo S, PA   Urinalysis is unremarkable no evidence of infection.  BNP marginally elevated 174.  CMP remarkable for severe hypokalemia 2.5.  Also new acute renal failure with BUN of 50 and creatinine 2.1.  These are newly and severely elevated from her baseline.  She also has mild alk phos elevation likely secondary to accumulation as she has ALT and AST mildly elevated as well.  No right upper quadrant abdominal tenderness to palpation.  No bilirubin elevation to indicate an biliary obstruction.  She has mild leukocytosis 12.6 which is likely reactive.  VBG notable for decreased oxygen which is likely chronic for this patient.  She is very mildly alkalotic which is likely due to her baseline tachypnea secondary to her chronic hypoxia.  Doubt there are any new acute changes here.  I discussed case with Dr. Marta Antu attending who recommended admission for AKI and hypokalemia.  Suspect symptoms are due to worsening CHF.  Patient has no record catheterization or echo that I can see.  Troponin pending at this time.  Care transferred to Dr. Flossie Buffy who will the patient to hospital for further evaluation and work-up and care.  Appreciate her expert consultation and acceptance of patient.   Troponin of 147.  This is likely due to decreased renal clearance secondary to her AKI.  I communicated this value with Dr. Flossie Buffy appreciate the update. Patient continues to have no chest pain at this time.  Repeat EKG shows no evidence of ischemia.  Discussed case with Dr. Florina Ou who is my attending physician at this time.   MDM Rules/Calculators/A&P                      Final Clinical Impression(s) / ED Diagnoses Final diagnoses:  Leg swelling  Hypokalemia  Other hypervolemia  AKI (acute kidney injury) ([place])  Pleural effusion    Rx / DC Orders ED Discharge Orders    None       Tedd Sias, [place] 08/22/19 0017     Margette Fast, MD 08/22/19 1349

## 2019-08-21 NOTE — ED Notes (Addendum)
Date and time results received: 08/21/19 11:55 PM  (use smartphrase ".now" to insert current time)  Test: troponin Critical Value: 147  Name of Provider Notified: [facility]  Orders Received? Or Actions Taken?:

## 2019-08-21 NOTE — ED Triage Notes (Signed)
Per EMS: Patient is coming from home with c/o SOB and bilateral leg swelling for the past three weeks. Pt has mottling on her legs and was ambulatory with assistance. Pt is taking prescribed lasix for leg swelling and reports it is not helping at all. Hx COPD.  BP 90 pal HR 120 RR 30 SPO2 100% 4L via [place] and is normally is on 3L

## 2019-08-21 NOTE — ED Notes (Signed)
Date and time results received: 08/21/19 10:39 PM  (use smartphrase ".now" to insert current time)  Test: Potassium Critical Value: 2.5  Name of Provider Notified: Ova Freshwater PA  Orders Received? Or Actions Taken?:

## 2019-08-22 ENCOUNTER — Encounter (HOSPITAL_COMMUNITY): Payer: Self-pay | Admitting: Family Medicine

## 2019-08-22 ENCOUNTER — Observation Stay (HOSPITAL_COMMUNITY): Payer: Medicare Other

## 2019-08-22 ENCOUNTER — Inpatient Hospital Stay (HOSPITAL_COMMUNITY): Payer: Medicare Other

## 2019-08-22 ENCOUNTER — Observation Stay (HOSPITAL_BASED_OUTPATIENT_CLINIC_OR_DEPARTMENT_OTHER): Payer: Medicare Other

## 2019-08-22 DIAGNOSIS — R778 Other specified abnormalities of plasma proteins: Secondary | ICD-10-CM

## 2019-08-22 DIAGNOSIS — E871 Hypo-osmolality and hyponatremia: Secondary | ICD-10-CM

## 2019-08-22 DIAGNOSIS — E8809 Other disorders of plasma-protein metabolism, not elsewhere classified: Secondary | ICD-10-CM | POA: Diagnosis present

## 2019-08-22 DIAGNOSIS — F411 Generalized anxiety disorder: Secondary | ICD-10-CM | POA: Diagnosis not present

## 2019-08-22 DIAGNOSIS — R188 Other ascites: Secondary | ICD-10-CM | POA: Diagnosis present

## 2019-08-22 DIAGNOSIS — I361 Nonrheumatic tricuspid (valve) insufficiency: Secondary | ICD-10-CM

## 2019-08-22 DIAGNOSIS — I351 Nonrheumatic aortic (valve) insufficiency: Secondary | ICD-10-CM | POA: Diagnosis not present

## 2019-08-22 DIAGNOSIS — K529 Noninfective gastroenteritis and colitis, unspecified: Secondary | ICD-10-CM | POA: Diagnosis present

## 2019-08-22 DIAGNOSIS — R0902 Hypoxemia: Secondary | ICD-10-CM | POA: Diagnosis present

## 2019-08-22 DIAGNOSIS — E8770 Fluid overload, unspecified: Secondary | ICD-10-CM | POA: Diagnosis not present

## 2019-08-22 DIAGNOSIS — I2510 Atherosclerotic heart disease of native coronary artery without angina pectoris: Secondary | ICD-10-CM | POA: Diagnosis present

## 2019-08-22 DIAGNOSIS — Z681 Body mass index (BMI) 19 or less, adult: Secondary | ICD-10-CM | POA: Diagnosis not present

## 2019-08-22 DIAGNOSIS — R7401 Elevation of levels of liver transaminase levels: Secondary | ICD-10-CM

## 2019-08-22 DIAGNOSIS — J439 Emphysema, unspecified: Secondary | ICD-10-CM | POA: Diagnosis present

## 2019-08-22 DIAGNOSIS — N179 Acute kidney failure, unspecified: Secondary | ICD-10-CM

## 2019-08-22 DIAGNOSIS — R600 Localized edema: Secondary | ICD-10-CM

## 2019-08-22 DIAGNOSIS — R531 Weakness: Secondary | ICD-10-CM

## 2019-08-22 DIAGNOSIS — J449 Chronic obstructive pulmonary disease, unspecified: Secondary | ICD-10-CM | POA: Diagnosis not present

## 2019-08-22 DIAGNOSIS — R627 Adult failure to thrive: Secondary | ICD-10-CM | POA: Diagnosis not present

## 2019-08-22 DIAGNOSIS — I70202 Unspecified atherosclerosis of native arteries of extremities, left leg: Secondary | ICD-10-CM | POA: Diagnosis present

## 2019-08-22 DIAGNOSIS — E876 Hypokalemia: Secondary | ICD-10-CM | POA: Diagnosis present

## 2019-08-22 DIAGNOSIS — G8929 Other chronic pain: Secondary | ICD-10-CM | POA: Diagnosis present

## 2019-08-22 DIAGNOSIS — I248 Other forms of acute ischemic heart disease: Secondary | ICD-10-CM | POA: Diagnosis present

## 2019-08-22 DIAGNOSIS — J90 Pleural effusion, not elsewhere classified: Secondary | ICD-10-CM | POA: Diagnosis present

## 2019-08-22 DIAGNOSIS — Z9981 Dependence on supplemental oxygen: Secondary | ICD-10-CM | POA: Diagnosis not present

## 2019-08-22 DIAGNOSIS — J9811 Atelectasis: Secondary | ICD-10-CM | POA: Diagnosis present

## 2019-08-22 DIAGNOSIS — I708 Atherosclerosis of other arteries: Secondary | ICD-10-CM | POA: Diagnosis present

## 2019-08-22 DIAGNOSIS — I739 Peripheral vascular disease, unspecified: Secondary | ICD-10-CM

## 2019-08-22 DIAGNOSIS — Z20822 Contact with and (suspected) exposure to covid-19: Secondary | ICD-10-CM | POA: Diagnosis present

## 2019-08-22 DIAGNOSIS — F329 Major depressive disorder, single episode, unspecified: Secondary | ICD-10-CM | POA: Diagnosis present

## 2019-08-22 DIAGNOSIS — K2270 Barrett's esophagus without dysplasia: Secondary | ICD-10-CM | POA: Diagnosis present

## 2019-08-22 DIAGNOSIS — G4700 Insomnia, unspecified: Secondary | ICD-10-CM | POA: Diagnosis present

## 2019-08-22 DIAGNOSIS — E875 Hyperkalemia: Secondary | ICD-10-CM | POA: Diagnosis present

## 2019-08-22 LAB — COMPREHENSIVE METABOLIC PANEL
ALT: 61 U/L — ABNORMAL HIGH (ref 0–44)
AST: 97 U/L — ABNORMAL HIGH (ref 15–41)
Albumin: 1.7 g/dL — ABNORMAL LOW (ref 3.5–5.0)
Alkaline Phosphatase: 213 U/L — ABNORMAL HIGH (ref 38–126)
Anion gap: 14 (ref 5–15)
BUN: 47 mg/dL — ABNORMAL HIGH (ref 8–23)
CO2: 24 mmol/L (ref 22–32)
Calcium: 7.3 mg/dL — ABNORMAL LOW (ref 8.9–10.3)
Chloride: 93 mmol/L — ABNORMAL LOW (ref 98–111)
Creatinine, Ser: 1.9 mg/dL — ABNORMAL HIGH (ref 0.44–1.00)
GFR calc Af Amer: 31 mL/min — ABNORMAL LOW (ref >60)
GFR calc non Af Amer: 27 mL/min — ABNORMAL LOW (ref >60)
Glucose, Bld: 80 mg/dL (ref 70–99)
Potassium: 3.4 mmol/L — ABNORMAL LOW (ref 3.5–5.1)
Sodium: 131 mmol/L — ABNORMAL LOW (ref 135–145)
Total Bilirubin: 0.9 mg/dL (ref 0.3–1.2)
Total Protein: 4.1 g/dL — ABNORMAL LOW (ref 6.5–8.1)

## 2019-08-22 LAB — URINALYSIS, ROUTINE W REFLEX MICROSCOPIC
Bilirubin Urine: NEGATIVE
Glucose, UA: NEGATIVE mg/dL
Hgb urine dipstick: NEGATIVE
Ketones, ur: NEGATIVE mg/dL
Leukocytes,Ua: NEGATIVE
Nitrite: NEGATIVE
Protein, ur: NEGATIVE mg/dL
Specific Gravity, Urine: 1.01 (ref 1.005–1.030)
pH: 5 (ref 5.0–8.0)

## 2019-08-22 LAB — CBC
HCT: 40.3 % (ref 36.0–46.0)
Hemoglobin: 13.4 g/dL (ref 12.0–15.0)
MCH: 32.3 pg (ref 26.0–34.0)
MCHC: 33.3 g/dL (ref 30.0–36.0)
MCV: 97.1 fL (ref 80.0–100.0)
Platelets: 244 10*3/uL (ref 150–400)
RBC: 4.15 MIL/uL (ref 3.87–5.11)
RDW: 15.9 % — ABNORMAL HIGH (ref 11.5–15.5)
WBC: 17.1 10*3/uL — ABNORMAL HIGH (ref 4.0–10.5)
nRBC: 0 % (ref 0.0–0.2)

## 2019-08-22 LAB — BLOOD GAS, VENOUS
Acid-Base Excess: 4.5 mmol/L — ABNORMAL HIGH (ref 0.0–2.0)
Bicarbonate: 28.2 mmol/L — ABNORMAL HIGH (ref 20.0–28.0)
O2 Saturation: 26.9 %
Patient temperature: 98.6
pCO2, Ven: 39.9 mmHg — ABNORMAL LOW (ref 44.0–60.0)
pH, Ven: 7.463 — ABNORMAL HIGH (ref 7.250–7.430)
pO2, Ven: <31 mmHg — CL (ref 32.0–45.0)

## 2019-08-22 LAB — SARS CORONAVIRUS 2 (TAT 6-24 HRS): SARS Coronavirus 2: NEGATIVE

## 2019-08-22 LAB — ECHOCARDIOGRAM COMPLETE
Height: 63.5 in
Weight: 1392 oz

## 2019-08-22 LAB — TROPONIN I (HIGH SENSITIVITY): Troponin I (High Sensitivity): 145 ng/L (ref <18)

## 2019-08-22 LAB — HIV ANTIBODY (ROUTINE TESTING W REFLEX): HIV Screen 4th Generation wRfx: NONREACTIVE

## 2019-08-22 MED ORDER — HYDROMORPHONE HCL 1 MG/ML IJ SOLN
0.5000 mg | Freq: Once | INTRAMUSCULAR | Status: AC | PRN
  Administered 2019-08-25: 0.5 mg via INTRAVENOUS
  Filled 2019-08-22: qty 0.5

## 2019-08-22 MED ORDER — ENSURE ENLIVE PO LIQD
237.0000 mL | Freq: Two times a day (BID) | ORAL | Status: DC
  Administered 2019-08-22 – 2019-08-25 (×7): 237 mL via ORAL

## 2019-08-22 MED ORDER — ZOLPIDEM TARTRATE 5 MG PO TABS: 5.0000 mg | ORAL_TABLET | Freq: Every evening | ORAL | Status: DC | PRN

## 2019-08-22 MED ORDER — BOOST / RESOURCE BREEZE PO LIQD CUSTOM
1.0000 | ORAL | Status: DC
  Administered 2019-08-23 – 2019-08-25 (×3): 1 via ORAL

## 2019-08-22 MED ORDER — ENOXAPARIN SODIUM 30 MG/0.3ML ~~LOC~~ SOLN
30.0000 mg | Freq: Every day | SUBCUTANEOUS | Status: DC
  Administered 2019-08-22 – 2019-08-25 (×5): 30 mg via SUBCUTANEOUS
  Filled 2019-08-22 (×5): qty 0.3

## 2019-08-22 MED ORDER — ALBUTEROL SULFATE (5 MG/ML) 0.5% IN NEBU: 5.0000 mg | INHALATION_SOLUTION | Freq: Four times a day (QID) | RESPIRATORY_TRACT | Status: DC | PRN

## 2019-08-22 MED ORDER — ADULT MULTIVITAMIN W/MINERALS CH
1.0000 | ORAL_TABLET | Freq: Every day | ORAL | Status: DC
  Administered 2019-08-22 – 2019-08-26 (×5): 1 via ORAL
  Filled 2019-08-22 (×5): qty 1

## 2019-08-22 MED ORDER — OXYCODONE HCL 5 MG PO TABS
5.0000 mg | ORAL_TABLET | Freq: Four times a day (QID) | ORAL | Status: DC | PRN
  Administered 2019-08-22 – 2019-08-26 (×14): 5 mg via ORAL
  Filled 2019-08-22 (×14): qty 1

## 2019-08-22 MED ORDER — ZOLPIDEM TARTRATE 5 MG PO TABS
5.0000 mg | ORAL_TABLET | Freq: Every evening | ORAL | Status: DC | PRN
  Administered 2019-08-22 – 2019-08-25 (×4): 5 mg via ORAL
  Filled 2019-08-22 (×4): qty 1

## 2019-08-22 MED ORDER — POTASSIUM CHLORIDE CRYS ER 20 MEQ PO TBCR
20.0000 meq | EXTENDED_RELEASE_TABLET | Freq: Every day | ORAL | Status: DC
  Administered 2019-08-22: 20 meq via ORAL
  Filled 2019-08-22: qty 1

## 2019-08-22 MED ORDER — GABAPENTIN 100 MG PO CAPS
200.0000 mg | ORAL_CAPSULE | Freq: Three times a day (TID) | ORAL | Status: DC
  Administered 2019-08-22 – 2019-08-26 (×13): 200 mg via ORAL
  Filled 2019-08-22 (×13): qty 2

## 2019-08-22 MED ORDER — METOCLOPRAMIDE HCL 5 MG PO TABS
5.0000 mg | ORAL_TABLET | Freq: Three times a day (TID) | ORAL | Status: DC
  Administered 2019-08-22 – 2019-08-26 (×17): 5 mg via ORAL
  Filled 2019-08-22 (×17): qty 1

## 2019-08-22 MED ORDER — IOHEXOL 9 MG/ML PO SOLN
ORAL | Status: AC
  Filled 2019-08-22: qty 1000

## 2019-08-22 MED ORDER — ENSURE ENLIVE PO LIQD: 237.0000 mL | Freq: Two times a day (BID) | ORAL | Status: DC

## 2019-08-22 MED ORDER — FUROSEMIDE 10 MG/ML IJ SOLN
20.0000 mg | Freq: Once | INTRAMUSCULAR | Status: AC
  Administered 2019-08-22: 20 mg via INTRAVENOUS
  Filled 2019-08-22: qty 2

## 2019-08-22 MED ORDER — POTASSIUM CHLORIDE CRYS ER 20 MEQ PO TBCR
20.0000 meq | EXTENDED_RELEASE_TABLET | Freq: Once | ORAL | Status: AC
  Administered 2019-08-22: 20 meq via ORAL
  Filled 2019-08-22: qty 1

## 2019-08-22 MED ORDER — FUROSEMIDE 10 MG/ML IJ SOLN
40.0000 mg | Freq: Once | INTRAMUSCULAR | Status: AC
  Administered 2019-08-22: 40 mg via INTRAVENOUS
  Filled 2019-08-22: qty 4

## 2019-08-22 MED ORDER — BUSPIRONE HCL 10 MG PO TABS: 10.0000 mg | ORAL_TABLET | Freq: Every day | ORAL | Status: DC

## 2019-08-22 MED ORDER — ORAL CARE MOUTH RINSE
15.0000 mL | Freq: Two times a day (BID) | OROMUCOSAL | Status: DC
  Administered 2019-08-22 – 2019-08-26 (×6): 15 mL via OROMUCOSAL

## 2019-08-22 MED ORDER — ALBUTEROL SULFATE (2.5 MG/3ML) 0.083% IN NEBU
2.5000 mg | INHALATION_SOLUTION | Freq: Four times a day (QID) | RESPIRATORY_TRACT | Status: DC | PRN
  Administered 2019-08-24: 2.5 mg via RESPIRATORY_TRACT
  Filled 2019-08-22: qty 3

## 2019-08-22 MED ORDER — NICOTINE 21 MG/24HR TD PT24
21.0000 mg | MEDICATED_PATCH | Freq: Every day | TRANSDERMAL | Status: DC
  Administered 2019-08-22 – 2019-08-26 (×5): 21 mg via TRANSDERMAL
  Filled 2019-08-22 (×5): qty 1

## 2019-08-22 MED ORDER — ACETAMINOPHEN 500 MG PO TABS
500.0000 mg | ORAL_TABLET | Freq: Four times a day (QID) | ORAL | Status: DC | PRN
  Administered 2019-08-22 – 2019-08-25 (×5): 500 mg via ORAL
  Filled 2019-08-22 (×6): qty 1

## 2019-08-22 MED ORDER — POTASSIUM CHLORIDE CRYS ER 20 MEQ PO TBCR
40.0000 meq | EXTENDED_RELEASE_TABLET | Freq: Once | ORAL | Status: AC
  Administered 2019-08-22: 40 meq via ORAL
  Filled 2019-08-22: qty 2

## 2019-08-22 NOTE — Plan of Care (Signed)
69 year old female lives at home with a roommate with history of O2 dependent COPD 24/7 at 3 to 4 L, CAD, chronic narcotic use and chronic pain, anxiety depression, Barrett's esophagus and gastroparesis/hemigastrectomy admitted with gradually increasing lower extremity swelling for weeks to months to the point that she was not able to walk anymore.  This was associated with shortness of breath at rest and dyspnea on exertion. She came to the ER multiple times this year with a similar complaints work-up included a lower extremity ultrasound which was negative for DVT.  Arteriogram October 2020 showed left superficial femoral artery occlusion and 60% stenosis of her distal right external iliac artery.  She is followed by vascular surgery. Work-up in the ED chest x-ray with small bilateral pleural effusion and bibasilar atelectasis versus infiltrate. Creatinine 2.10 up from her baseline 0.78.  Sodium 130 potassium 2.5.  Elevated AST and ALT with alkaline phosphatase. White count 12.6 Albumin very low 1.7 Repeated labs 08/22/2019-sodium 131, potassium 3.4 after replacement, BUN 47 creatinine 1.90, troponin I 45, white count 17.1 which is up from the time of admission 12.6. AST ALT and alkaline phosphatase 97/61/213/bilirubin 0.9 Hemoglobin 13.4 Covid negative UA negative BNP 174, troponin I 47, 145 EKG-Q waves in V1 V2, very low voltage in my reading Gallbladder ultrasound in 06/2017 showed gallbladder sludge and distention no evidence of cholecystitis or biliary duct dilatation noted at that time. On exam she complains of abdominal pain she does not remember when her last BM was she has decreased breath sounds at the bases with nonspecific generalized abdominal tenderness and 3+ pitting edema.  Bilateral lower extremity edema-patient reports she was compliant to diuretics at home.  Review of her records show she did have chronic lower extremity edema but not to the extent that she has this admission, she  has severe hypoalbuminemia which could contribute to her lower extremity edema.  I will order right upper quadrant ultrasound to rule out any biliary pathology, check echocardiogram, PT consult. Leukocytosis -no source of infection identified yet.  She is afebrile.  Will monitor closely hold off on any antibiotics at this time. She appears very deconditioned she came from home likely will need SNF await PT evaluation.

## 2019-08-22 NOTE — Evaluation (Signed)
Physical Therapy Evaluation Patient Details Name: [name] MRN: [id number] DOB: 06-30-1950 Today's Date: 08/22/2019   History of Present Illness  Pt admitted with SOB, weakness and increased LE edema and dx with bil pleural effusion.  Pt with hx of COPD, macular degeneration, Gastric outlet syndrome, L hip fx (10), R hip fx (15), osteoporosis, CAD and chronic LBP  Clinical Impression  Pt admitted as above and presenting with functional mobility 2* generalized weakness, balance deficits and bil LE pain increased with WB.  Pt would benefit from follow up rehab at SNF level to maximize IND and safety prior to dc home with ltd assist.    Follow Up Recommendations SNF    Equipment Recommendations  None recommended by PT    Recommendations for Other Services       Precautions / Restrictions Precautions Precautions: Fall Restrictions Weight Bearing Restrictions: No      Mobility  Bed Mobility Overal bed mobility: Needs Assistance Bed Mobility: Supine to Sit;Sit to Supine     Supine to sit: Min assist;Mod assist Sit to supine: Mod assist   General bed mobility comments: increased time with use of bedrail and assist to manage LEs on/off bed  Transfers Overall transfer level: Needs assistance Equipment used: Rolling walker (2 wheeled) Transfers: Sit to/from Stand Sit to Stand: Min assist;Mod assist;+2 physical assistance;+2 safety/equipment;From elevated surface         General transfer comment: pt stood x 2 from elevated bed; physical assist to bring wt up and fwd and to balance in standing  Ambulation/Gait Ambulation/Gait assistance: Min assist;Mod assist;+2 safety/equipment Gait Distance (Feet): 3 Feet Assistive device: Rolling walker (2 wheeled) Gait Pattern/deviations: Step-to pattern;Decreased step length - right;Decreased step length - left;Shuffle;Trunk flexed Gait velocity: decr   General Gait Details: Pt ltd by increased LE pain with WB; pt side-stepped up  side of bed only  Stairs            Wheelchair Mobility    Modified Rankin (Stroke Patients Only)       Balance Overall balance assessment: Needs assistance Sitting-balance support: Bilateral upper extremity supported Sitting balance-Leahy Scale: Poor Sitting balance - Comments: posterior drift   Standing balance support: Bilateral upper extremity supported Standing balance-Leahy Scale: Poor                               Pertinent Vitals/Pain Pain Assessment: 0-10 Pain Score: 10-Worst pain ever Faces Pain Scale: Hurts even more Pain Location: Bil LEs with WB Pain Descriptors / Indicators: Grimacing;Guarding;Sore Pain Intervention(s): Limited activity within patient's tolerance;Monitored during session;Patient requesting pain meds-RN notified    Home Living Family/patient expects to be discharged to:: Unsure Living Arrangements: Non-relatives/Friends                    Prior Function Level of Independence: Independent         Comments: Pt reports requiring increasing level of assist from roommate leading up to admit     Hand Dominance        Extremity/Trunk Assessment   Upper Extremity Assessment Upper Extremity Assessment: Generalized weakness    Lower Extremity Assessment Lower Extremity Assessment: Generalized weakness    Cervical / Trunk Assessment Cervical / Trunk Assessment: Kyphotic  Communication   Communication: No difficulties  Cognition Arousal/Alertness: Awake/alert Behavior During Therapy: WFL for tasks assessed/performed Overall Cognitive Status: Within Functional Limits for tasks assessed  General Comments      Exercises     Assessment/Plan    PT Assessment Patient needs continued PT services  PT Problem List Decreased strength;Decreased activity tolerance;Decreased balance;Decreased mobility;Decreased knowledge of use of DME;Pain       PT  Treatment Interventions DME instruction;Gait training;Functional mobility training;Therapeutic activities;Therapeutic exercise;Balance training;Patient/family education    PT Goals (Current goals can be found in the Care Plan section)  Acute Rehab PT Goals Patient Stated Goal: less pain PT Goal Formulation: With patient Time For Goal Achievement: 09/04/19 Potential to Achieve Goals: Fair    Frequency Min 2X/week   Barriers to discharge        Co-evaluation               AM-PAC PT "6 Clicks" Mobility  Outcome Measure Help needed turning from your back to your side while in a flat bed without using bedrails?: A Little Help needed moving from lying on your back to sitting on the side of a flat bed without using bedrails?: A Lot Help needed moving to and from a bed to a chair (including a wheelchair)?: A Lot Help needed standing up from a chair using your arms (e.g., wheelchair or bedside chair)?: A Lot Help needed to walk in hospital room?: A Lot Help needed climbing 3-5 steps with a railing? : Total 6 Click Score: 12    End of Session Equipment Utilized During Treatment: Gait belt;Oxygen Activity Tolerance: Patient limited by fatigue;Patient limited by pain Patient left: in bed;with call bell/phone within reach;with bed alarm set Nurse Communication: Mobility status PT Visit Diagnosis: Muscle weakness (generalized) (M62.81);Difficulty in walking, not elsewhere classified (R26.2);Pain Pain - Right/Left: (bilat) Pain - part of body: Leg    Time: [id number] PT Time Calculation (min) (ACUTE ONLY): 22 min   Charges:   PT Evaluation $PT Eval Low Complexity: 1 Low          [place] Pager [phone] Office [phone]   Carie Kapuscinski 08/22/2019, 5:50 PM

## 2019-08-22 NOTE — Progress Notes (Signed)
Patient did not void since I/0 cath this AM. Bladder scanner showed 451ml.  Patient I/O cath'd per protocol.  Dried blood present on purewick upon removal.  Thick, pink discharge present prior to yellow urine when patient I/O cath'd.  Dr. Rodena Piety aware.  Will continue to monitor.

## 2019-08-22 NOTE — Progress Notes (Signed)
Initial Nutrition Assessment  RD working remotely.   DOCUMENTATION CODES:   Underweight  INTERVENTION:  - will order Boost Breeze once/day, each supplement provides 250 kcal and 9 grams of protein. - continue Ensure Enlive BID, each supplement provides 350 kcal and 20 grams of protein. - will order Magic Cup BID with meals, each supplement provides 290 kcal and 9 grams of protein. - will order daily multivitamin with minerals.   NUTRITION DIAGNOSIS:   Inadequate oral intake related to acute illness, poor appetite as evidenced by per patient/family report, meal completion < 25%.  GOAL:   Patient will meet greater than or equal to 90% of their needs  MONITOR:   PO intake, Supplement acceptance, Labs, Weight trends  REASON FOR ASSESSMENT:   Malnutrition Screening Tool, Consult Assessment of nutrition requirement/status  ASSESSMENT:   69 y.o. female with medical history of CAD, COPD on home 3-4L, Barrett's esophagus, gastroparesis d/t hemigastrectomy, anxiety/depression, and chronic narcotic. She presented to the ED due to SOB and decreased ambulation with increased BLE swelling up to hips for several months. She is overall a poor historian. She has been evaluated in the ED several times in the past 2 months for similar symptoms. CXR showed small bilateral pleural effusions and bibasilar atelectasis versus infiltrate.  Unable to reach patient by phone at this time. Location noted as cardiac ultrasound so may be having a test done at this time. She consumed 15% of breakfast this AM. Ensure Enlive was ordered for starting this afternoon.   Per chart review, weight yesterday was 87 lb and weight on 08/04/19 was 90 lb. This indicates 3 lb weight loss (3.3% body weight) in the past 2.5 weeks. Weight on 06/08/19 was 95 lb which indicates 8 lb weight loss (8.4% body weight) in the past 2.5 months. Notes indicate patient has had on and off BLE swelling since 2009. Flow sheet documentation  indicates she has mild BUE and moderate BLE edema at this time.   Per notes: - bilateral pleural effusion - BLE edema with associated weakness - elevated troponin thought to be 2/2 AKI - hyponatremia and mild hypokalemia - FTT - likely need for SNF at time of d/c   Labs reviewed; Na: 131 mmol/l, K: 3.4 mmol/l, Cl: 93 mmol/l, BUN: 47 mg/dl, creatinine: 1.9 mg/dl, Ca: 7.3 mg/dl, Alk Phos elevated, LFTs elevated, GFR: 27 ml/min. Medications reviewed; 40 mg IV lasix x1 dose 2/28, 5 mg reglan TID, 20 mEq Klor-Con/day.      NUTRITION - FOCUSED PHYSICAL EXAM:  unable to complete at this time.   Diet Order:   Diet Order            Diet regular Room service appropriate? Yes; Fluid consistency: Thin  Diet effective now              EDUCATION NEEDS:   No education needs have been identified at this time  Skin:  Skin Assessment: Reviewed RN Assessment  Last BM:  PTA/unknown  Height:   Ht Readings from Last 1 Encounters:  08/21/19 5' 3.5" (1.613 m)    Weight:   Wt Readings from Last 1 Encounters:  08/21/19 39.5 kg    Ideal Body Weight:  53.4 kg  BMI:  Body mass index is 15.17 kg/m.  Estimated Nutritional Needs:   Kcal:  1580-1850 kcal  Protein:  80-95 grams  Fluid:  >/= 1.5 L/day     Jarome Matin, MS, RD, LDN, CNSC Inpatient Clinical Dietitian RD pager # available in [facility]  After hours/weekend pager # available in [facility]

## 2019-08-22 NOTE — Evaluation (Signed)
Clinical/Bedside Swallow Evaluation Patient Details  Name: [name] MRN: [id number] Date of Birth: February 03, 1951  Today's Date: 08/22/2019 Time: SLP Start Time (ACUTE ONLY): [id number] SLP Stop Time (ACUTE ONLY): 1420 SLP Time Calculation (min) (ACUTE ONLY): 25 min  Past Medical History:  Past Medical History:  Diagnosis Date  . Acute duodenal ulcer with hemorrhage and perforation, with obstruction ([place]) 06/26/2004   multiple pyloric perferations  . Allergy   . Anemia   . Anxiety    sees [name] NP at Dr. Radonna Ricker office  . Asthma    hx of   . Barrett's esophagus   . Chronic abdominal pain    narcotic dependence, dr gyarteng-dak at [facility]  . 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 Complication of anesthesia    pt states had too much xanax on board - agitated   . COPD (chronic obstructive pulmonary disease) ([place])   . Depression   . Exertional shortness of breath   . Fall 11/23/2014   frequent falls  . Fx of fibula 07-28-11   left fibula, 2 places   . Fx two ribs-open 08-24-11   left 4th and 5th  . GASTRIC OUTLET OBSTRUCTION 08/28/2007   In July 2008 she underwent laparoscopic enterolysis, Nissen fundoplication over a [id number] bougie, single pledgeted suture colosure of the hiatus and a 3 suture wrap.  Lap truncal vagotomy and a loop gastrojejunostomy was performed.  This was revised in August of 2009 to a roux en Y gastrojejujostomy.     . Gastroparesis 06/03/2007   Qualifier: Diagnosis of  By: [name] MD, [name]   . GERD (gastroesophageal reflux disease)   . History of blood transfusion    "related to OR; maybe when I perforated that ulcer"  . History of hiatal hernia   . Lap Nissen + truncal vagotomy July 2008 05/13/2013  . LEG EDEMA 01/19/2008   Qualifier: Diagnosis of  By: [name] MD, [name]   . Macular degeneration, bilateral    early onset, severe, legally blind and worsening. Followed by dr. Katy Fitch  . MENOPAUSE 01/07/2007   Qualifier:  Diagnosis of  By: [name], CMA, [place]    . Narcotic abuse ([place])   . Osteoporosis    severe  . Oxygen deficiency   . Oxygen dependent    3L/[place]   . Peptic ulcer disease    dr Oletta Lamas  . Pneumonia, organism unspecified(486) 11/07/2010   Assoc with R Parapneumonic effusion 09/2010    - Tapped 10/05/10    - CxR resolved 11/07/2010    . S/P jejunostomy Feb 2016 07/26/2014  . Thyroid nodule 2013   benign after biopsy w/ Dr. Redmond Baseman   Past Surgical History:  Past Surgical History:  Procedure Laterality Date  . BIOPSY THYROID  08-13-11   benign nodule, per Dr. Melida Quitter   . CATARACT EXTRACTION W/ INTRAOCULAR LENS  IMPLANT, BILATERAL Bilateral   . CHOLECYSTECTOMY N/A 12/02/2017   Procedure: LAPAROSCOPIC CHOLECYSTECTOMY WITH INTRAOPERATIVE CHOLANGIOGRAM ERAS PATHWAY;  Surgeon: Johnathan Hausen, MD;  Location: [facility];  Service: General;  Laterality: N/A;  . COLONOSCOPY    . DILATION AND CURETTAGE OF UTERUS    . ESOPHAGOGASTRODUODENOSCOPY  12-29-09   dr qadeer at [facility], several gastric ulcers  . ESOPHAGOGASTRODUODENOSCOPY N/A 09/25/2012   Procedure: ESOPHAGOGASTRODUODENOSCOPY (EGD);  Surgeon: Beryle Beams, MD;  Location: [facility];  Service: Endoscopy;  Laterality: N/A;  . ESOPHAGOGASTRODUODENOSCOPY (EGD) WITH PROPOFOL N/A 06/27/2017   Procedure: ESOPHAGOGASTRODUODENOSCOPY (EGD) WITH PROPOFOL;  Surgeon: Carol Ada, MD;  Location: [facility];  Service: Endoscopy;  Laterality: N/A;  . FRACTURE SURGERY Left 2010   hip and leg s/p fall  . GASTROJEJUNOSTOMY  02/20/2008   Roux en Y gastrojejunsotomy w 1 foot Roux limb, laparotomy, takedown loop gastrojejunostomy with removal of Ntinol stent  . GASTROSTOMY N/A 07/26/2014   Procedure: LAPRASCOPIC ASSISTED OPEN PLACEMENT OF JEJUNOSTOMY TUBE;  Surgeon: Pedro Earls, MD;  Location: [facility];  Service: General;  Laterality: N/A;  . HERNIA REPAIR     "hiatal"  . JOINT REPLACEMENT    . LAPAROSCOPIC NISSEN FUNDOPLICATION  [id number]  . LAPAROSCOPIC PARTIAL  GASTRECTOMY N/A 06/29/2013   Procedure: Gastrectomy and GJ Tube placement;  Surgeon: Pedro Earls, MD;  Location: [facility];  Service: General;  Laterality: N/A;  . ORIF FEMORAL NECK FRACTURE W/ DHS Right 03/2014  . ORIF HIP FRACTURE Left 2010  . REPAIR OF PERFORATED ULCER    . TONSILLECTOMY  1974  . TRUNCAL VAGOTOMY  12/2006   Laparoscopic enterolysis, laparoscopic Nissen's Fundoplication, laparoscopic truncal vagotomy & loop gastrojejunostomy   HPI:  [name] is a 69 y.o. female with medical history significant for CAD, COPD on home 3-4L, Barrett's esophagus, gastroparesis due to hemigastrectomy, anxiety/depression, chronic narcotic use who presents with shortness of breath and decrease ambulation.  Chest x-ray shows small bilateral pleural effusions and bibasilar atelectasis versus infiltrate.   Assessment / Plan / Recommendation Clinical Impression  Pt was seen for a bedside swallow evaluation and she presents with oral dysphagia secondary to edentulism with suspected pharyngeal and/or esophageal dysphagia.  Pt was encountered awake/alert with lunch meal tray in front of her.  Pt was observed with trials of thin liquid, puree, and regular solids.  She exhibited good bolus acceptance with all trials.  Mastication was significantly prolonged with regular solids secondary to edentulism and AP transport was prolonged with puree and solid trials.  Immediate and delayed throat clears were consistently observed with all solid trials.  No overt s/sx of aspiration were observed with thin liquid despite challenging.  Of note, pt has a known hx of Barrett's esophagus and gastroparesis which may be impacting oropharyngeal swallowing abilities.  Pt may benefit from a GI consult to further evalaute.  Recommend diet change to clear liquids (thin) with ST f/u for diagnostic treatment in the next 24-36 hours.  An instrumental swallow study may be warranted if clinical s/sx of aspiration persist.    SLP Visit  Diagnosis: Dysphagia, unspecified (R13.10)    Aspiration Risk  Risk for inadequate nutrition/hydration;Moderate aspiration risk    Diet Recommendation Thin liquid;Other (Comment)(Clear liquid )   Liquid Administration via: Cup;Straw Medication Administration: Crushed with puree Supervision: Patient able to self feed;Intermittent supervision to cue for compensatory strategies Compensations: Minimize environmental distractions;Slow rate;Small sips/bites Postural Changes: Seated upright at 90 degrees    Other  Recommendations Oral Care Recommendations: Oral care BID   Follow up Recommendations Other (comment)(TBD)      Frequency and Duration min 2x/week  2 weeks       Prognosis Prognosis for Safe Diet Advancement: Fair      Swallow Study   General HPI: SEVEN CHALLA is a 69 y.o. female with medical history significant for CAD, COPD on home 3-4L, Barrett's esophagus, gastroparesis due to hemigastrectomy, anxiety/depression, chronic narcotic use who presents with shortness of breath and decrease ambulation.  Chest x-ray shows small bilateral pleural effusions and bibasilar atelectasis versus infiltrate. Type of Study: Bedside Swallow Evaluation Previous  Swallow Assessment: None documented  Diet Prior to this Study: Regular;Thin liquids Temperature Spikes Noted: No Respiratory Status: Nasal cannula History of Recent Intubation: No Behavior/Cognition: Alert;Cooperative;Pleasant mood Oral Cavity Assessment: Within Functional Limits Oral Care Completed by SLP: No Oral Cavity - Dentition: Edentulous Vision: Functional for self-feeding Self-Feeding Abilities: Able to feed self;Needs set up Patient Positioning: Upright in bed Baseline Vocal Quality: Low vocal intensity Volitional Cough: Weak Volitional Swallow: Able to elicit    Oral/Motor/Sensory Function Overall Oral Motor/Sensory Function: Within functional limits   Ice Chips Ice chips: Not tested   Thin Liquid Thin Liquid:  Within functional limits Presentation: Straw;Cup    Nectar Thick Nectar Thick Liquid: Not tested   Honey Thick Honey Thick Liquid: Not tested   Puree Puree: Impaired Presentation: Self Fed;Spoon Oral Phase Functional Implications: Prolonged oral transit Pharyngeal Phase Impairments: Throat Clearing - Immediate;Throat Clearing - Delayed   Solid     Solid: Impaired Presentation: Self Fed Oral Phase Impairments: Impaired mastication Oral Phase Functional Implications: Impaired mastication;Prolonged oral transit Pharyngeal Phase Impairments: Throat Clearing - Immediate;Throat Clearing - Delayed     Colin Mulders M.S., [id number] Acute Rehabilitation Services Office: [phone]  Elvia Collum Sloane Junkin 08/22/2019,2:34 PM

## 2019-08-22 NOTE — H&P (Signed)
History and Physical    [name] [id number] DOB: 07-09-1950 DOA: 08/21/2019  PCP: [name] Huh, [name]  Patient coming from: Home, lives with roommate  I have personally briefly reviewed patient's old medical records in [place]  Chief Complaint: shortness of breath and lower extremity weakness  HPI: [name] is a 69 y.o. female with medical history significant for CAD, COPD on home 3-4L, Barrett's esophagus, gastroparesis due to hemigastrectomy, anxiety/depression, chronic narcotic use who presents with shortness of breath and decrease ambulation.  Patient says that she has been having lower extremity swelling up to her hips for months and has had progressive weakness to the point where today she can no longer walk. Has pain to bilateral thigh. Endorsed compliant with her diuretic.  She also had increased shortness of breath both at rest and exertion.  Normally on 3 to 4 L of oxygen at home.  She is not able to tell me whether she has been having any orthopnea but she has to recline when she sleeps.  Denies any chest pain.  Patient otherwise is a poor historian and unable to recall much other history or symptoms.  Patient has been evaluated in the ED several times in the last 2 months for similar symptoms in last had a negative lower extremity ultrasound on 08/03/2019. She also saw vascular surgery on 08/04/2019 and it was noted that she had evidence of leg swelling symptoms dating back to 2009.  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  In the ED, she was afebrile with borderline blood pressure of around 102 over 80s and was on her normal home 3 L via nasal cannula. Lab work showed mild leukocytosis of 12.6.  Sodium of 130, potassium of 2.5, glucose of 118, creatinine elevated at 2.10 from a prior baseline of 0.78.  AST elevated at 97 and ALT at 60, alkaline  phosphatase of 208.  BNP of 174.  Troponin elevated at 147.  Chest x-ray shows small bilateral pleural effusions and bibasilar atelectasis versus infiltrate.   Review of Systems: Unable to fully obtain since pt not able to recall much of her symptoms  Past Medical History:  Diagnosis Date  . Acute duodenal ulcer with hemorrhage and perforation, with obstruction ([place]) 06/26/2004   multiple pyloric perferations  . Allergy   . Anemia   . Anxiety    sees [name]  . Asthma    hx of   . Barrett's esophagus   . Chronic abdominal pain    narcotic dependence, dr gyarteng-dak at [facility]  . 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 Complication of anesthesia    pt states had too much xanax on board - agitated   . COPD (chronic obstructive pulmonary disease) ([place])   . Depression   . Exertional shortness of breath   . Fall 11/23/2014   frequent falls  . Fx of fibula 07-28-11   left fibula, 2 places   . Fx two ribs-open 08-24-11   left 4th and 5th  . GASTRIC OUTLET OBSTRUCTION 08/28/2007   In July 2008 she underwent laparoscopic enterolysis, Nissen fundoplication over a [id number] bougie, single pledgeted suture colosure of the hiatus and a 3 suture wrap.  Lap truncal vagotomy and a loop gastrojejunostomy was performed.  This was revised in August of 2009 to a roux en Y  gastrojejujostomy.     . Gastroparesis 06/03/2007   Qualifier: Diagnosis of  By: [name] MD, [name]   . GERD (gastroesophageal reflux disease)   . History of blood transfusion    "related to OR; maybe when I perforated that ulcer"  . History of hiatal hernia   . Lap Nissen + truncal vagotomy July 2008 05/13/2013  . LEG EDEMA 01/19/2008   Qualifier: Diagnosis of  By: [name] MD, [name]   . Macular degeneration, bilateral    early onset, severe, legally blind and worsening. Followed by dr. Katy Fitch  . MENOPAUSE 01/07/2007   Qualifier: Diagnosis of  By: [name], CMA, [place]     . Narcotic abuse ([place])   . Osteoporosis    severe  . Oxygen deficiency   . Oxygen dependent    3L/[place]   . Peptic ulcer disease    dr Oletta Lamas  . Pneumonia, organism unspecified(486) 11/07/2010   Assoc with R Parapneumonic effusion 09/2010    - Tapped 10/05/10    - CxR resolved 11/07/2010    . S/P jejunostomy Feb 2016 07/26/2014  . Thyroid nodule 2013   benign after biopsy w/ Dr. Redmond Baseman    Past Surgical History:  Procedure Laterality Date  . BIOPSY THYROID  08-13-11   benign nodule, per Dr. Melida Quitter   . CATARACT EXTRACTION W/ INTRAOCULAR LENS  IMPLANT, BILATERAL Bilateral   . CHOLECYSTECTOMY N/A 12/02/2017   Procedure: LAPAROSCOPIC CHOLECYSTECTOMY WITH INTRAOPERATIVE CHOLANGIOGRAM ERAS PATHWAY;  Surgeon: Johnathan Hausen, MD;  Location: [facility];  Service: General;  Laterality: N/A;  . COLONOSCOPY    . DILATION AND CURETTAGE OF UTERUS    . ESOPHAGOGASTRODUODENOSCOPY  12-29-09   dr qadeer at [facility], several gastric ulcers  . ESOPHAGOGASTRODUODENOSCOPY N/A 09/25/2012   Procedure: ESOPHAGOGASTRODUODENOSCOPY (EGD);  Surgeon: Beryle Beams, MD;  Location: [facility];  Service: Endoscopy;  Laterality: N/A;  . ESOPHAGOGASTRODUODENOSCOPY (EGD) WITH PROPOFOL N/A 06/27/2017   Procedure: ESOPHAGOGASTRODUODENOSCOPY (EGD) WITH PROPOFOL;  Surgeon: Carol Ada, MD;  Location: [facility];  Service: Endoscopy;  Laterality: N/A;  . FRACTURE SURGERY Left 2010   hip and leg s/p fall  . GASTROJEJUNOSTOMY  02/20/2008   Roux en Y gastrojejunsotomy w 1 foot Roux limb, laparotomy, takedown loop gastrojejunostomy with removal of Ntinol stent  . GASTROSTOMY N/A 07/26/2014   Procedure: LAPRASCOPIC ASSISTED OPEN PLACEMENT OF JEJUNOSTOMY TUBE;  Surgeon: Pedro Earls, MD;  Location: [facility];  Service: General;  Laterality: N/A;  . HERNIA REPAIR     "hiatal"  . JOINT REPLACEMENT    . LAPAROSCOPIC NISSEN FUNDOPLICATION  [id number]  . LAPAROSCOPIC PARTIAL GASTRECTOMY N/A 06/29/2013   Procedure: Gastrectomy and GJ Tube  placement;  Surgeon: Pedro Earls, MD;  Location: [facility];  Service: General;  Laterality: N/A;  . ORIF FEMORAL NECK FRACTURE W/ DHS Right 03/2014  . ORIF HIP FRACTURE Left 2010  . REPAIR OF PERFORATED ULCER    . TONSILLECTOMY  1974  . TRUNCAL VAGOTOMY  12/2006   Laparoscopic enterolysis, laparoscopic Nissen's Fundoplication, laparoscopic truncal vagotomy & loop gastrojejunostomy     reports that she has been smoking cigarettes. She has a 51.00 pack-year smoking history. She has never used smokeless tobacco. She reports that she does not drink alcohol or use drugs.  Allergies  Allergen Reactions  . Lithium Nausea And Vomiting  . Celebrex [Celecoxib] Other (See Comments)    History of bleeding ulcers  . Quetiapine Other (See Comments)     tardive dyskinesia  . Varenicline Tartrate  Other (See Comments)    hallucinations  . Morphine Itching    Family History  Problem Relation Age of Onset  . Cancer Mother        kidney/bladder cancer, magliant skin cancer on face, cervical cancer  . Stroke Mother   . Hypertension Mother   . Celiac disease Father   . Cancer Father        metastatic renal cell carcinoma, pancreatic cancer, colon polyps  . Colon cancer Maternal Uncle   . Breast cancer Neg Hx   . Ovarian cancer Neg Hx   . Endometrial cancer Neg Hx      Prior to Admission medications   Medication Sig Start Date End Date Taking? Authorizing Provider  acetaminophen (TYLENOL) 500 MG tablet Take 1 tablet (500 mg total) by mouth every 6 (six) hours as needed. 06/08/19  Yes Maximiano Coss, [name]  albuterol (PROVENTIL) (5 MG/ML) 0.5% nebulizer solution Take 1 mL (5 mg total) by nebulization every 6 (six) hours as needed for wheezing or shortness of breath. 08/05/18  Yes Carlisle Cater, PA-C  ALPRAZolam (XANAX PO) Take 1 tablet by mouth at bedtime as needed (anxiety, sleep).    Yes [provider]  Aspirin-Acetaminophen (GOODYS BODY PAIN PO) Take 1-2 packets by mouth every 8  (eight) hours as needed (pain).    Yes [provider]  bumetanide (BUMEX) 0.5 MG tablet Take 0.5 mg by mouth daily. 08/17/19  Yes [provider]  busPIRone (BUSPAR) 10 MG tablet Take 10 mg by mouth daily. 09/06/18  Yes [provider]  cyclobenzaprine (FLEXERIL) 10 MG tablet Take 10 mg by mouth 3 (three) times daily as needed. 08/16/19  Yes [provider]  etodolac (LODINE XL) 400 MG 24 hr tablet Take 400 mg by mouth daily as needed. 08/16/19  Yes [provider]  gabapentin (NEURONTIN) 100 MG capsule Take 200 mg by mouth 3 (three) times daily. 08/16/19  Yes [provider]  ondansetron (ZOFRAN-ODT) 8 MG disintegrating tablet Take 8 mg by mouth 3 (three) times daily as needed. 08/16/19  Yes [provider]  oxyCODONE-acetaminophen (PERCOCET/ROXICET) 5-325 MG tablet Take 1 tablet by mouth 5 (five) times daily as needed. 08/16/19  Yes [provider]  Potassium Chloride ER 20 MEQ TBCR Take 20 mEq by mouth daily. 08/16/19  Yes [provider]  PROAIR HFA 108 (90 Base) MCG/ACT inhaler Inhale 1 puff into the lungs every 4 (four) hours as needed for wheezing or shortness of breath.  08/06/18  Yes [provider]  Vitamin D, Ergocalciferol, (DRISDOL) 1.25 MG (50000 UT) CAPS capsule Take 1 capsule (50,000 Units total) by mouth every 7 (seven) days. 06/01/18  Yes Rutherford Guys, MD  zolpidem (AMBIEN) 5 MG tablet Take 5 mg by mouth at bedtime as needed for sleep.  07/20/18  Yes [provider]  metoCLOPramide (REGLAN) 5 MG tablet Take 1 tablet (5 mg total) by mouth 4 (four) times daily -  before meals and at bedtime. 08/18/18   Shawnee Knapp, MD  oxyCODONE (OXY IR/ROXICODONE) 5 MG immediate release tablet Take 1 tablet (5 mg total) by mouth every 6 (six) hours as needed for severe pain. 12/02/17   Johnathan Hausen, MD    Physical Exam: Vitals:   08/21/19 2134 08/21/19 2134 08/21/19 2300 08/22/19 0001  BP: 102/84  101/76  99/83  Pulse: 98   88  Resp: 20  18 14   Temp:      TempSrc:  SpO2: 91%   90%  Weight:  39.5 kg    Height:  5' 3.5" (1.613 m)      Constitutional: NAD, calm, comfortable, thin frail female appearing older than her staged age laying at 57 degree incline in bed Vitals:   08/21/19 2134 08/21/19 2134 08/21/19 2300 08/22/19 0001  BP: 102/84  101/76 99/83  Pulse: 98   88  Resp: 20  18 14   Temp:      TempSrc:      SpO2: 91%   90%  Weight:  39.5 kg    Height:  5' 3.5" (1.613 m)     Eyes: PERRL, lids and conjunctivae normal ENMT: Mucous membranes are moist. No dentition.  Neck: normal, supple Respiratory: diminished breath sounds throughout but no wheezing, no crackles. Normal respiratory effort. No accessory muscle use.  Cardiovascular: Regular rate and rhythm, no murmurs / rubs / gallops. +2 pitting edema of bilateral LE up to hip Abdomen: no tenderness, no masses palpated. Bowel sounds positive.  Musculoskeletal: no clubbing / cyanosis. No joint deformity upper and lower extremities. Good ROM, no contractures. Normal muscle tone.  Skin: no rashes, lesions, ulcers. No induration Neurologic: CN 2-12 grossly intact. Sensation intact,  Strength 3/5 in lower extremity Psychiatric: Normal judgment and insight. Alert and oriented x 3. Normal mood.     Labs on Admission: I have personally reviewed following labs and imaging studies  CBC: Recent Labs  Lab 08/21/19 2132  WBC 12.6*  NEUTROABS 11.1*  HGB 14.1  HCT 41.4  MCV 95.0  PLT 123456   Basic Metabolic Panel: Recent Labs  Lab 08/21/19 2132  NA 130*  K 2.5*  CL 90*  CO2 25  GLUCOSE 118*  BUN 50*  CREATININE 2.10*  CALCIUM 7.3*  MG 2.1   GFR: Estimated Creatinine Clearance: 16.2 mL/min (A) (by C-G formula based on SCr of 2.1 mg/dL (H)). Liver Function Tests: Recent Labs  Lab 08/21/19 2132  AST 97*  ALT 60*  ALKPHOS 208*  BILITOT 0.9  PROT 4.1*  ALBUMIN 1.7*   No results for input(s): LIPASE, AMYLASE in the  last 168 hours. No results for input(s): AMMONIA in the last 168 hours. Coagulation Profile: No results for input(s): INR, PROTIME in the last 168 hours. Cardiac Enzymes: No results for input(s): CKTOTAL, CKMB, CKMBINDEX, TROPONINI in the last 168 hours. BNP (last 3 results) No results for input(s): PROBNP in the last 8760 hours. HbA1C: No results for input(s): HGBA1C in the last 72 hours. CBG: No results for input(s): GLUCAP in the last 168 hours. Lipid Profile: No results for input(s): CHOL, HDL, LDLCALC, TRIG, CHOLHDL, LDLDIRECT in the last 72 hours. Thyroid Function Tests: No results for input(s): TSH, T4TOTAL, FREET4, T3FREE, THYROIDAB in the last 72 hours. Anemia Panel: No results for input(s): VITAMINB12, FOLATE, FERRITIN, TIBC, IRON, RETICCTPCT in the last 72 hours. Urine analysis:    Component Value Date/Time   COLORURINE YELLOW 08/21/2019 2254   APPEARANCEUR CLEAR 08/21/2019 2254   LABSPEC 1.010 08/21/2019 2254   PHURINE 5.0 08/21/2019 2254   GLUCOSEU NEGATIVE 08/21/2019 2254   HGBUR NEGATIVE 08/21/2019 2254   HGBUR negative 08/11/2009 1355   BILIRUBINUR NEGATIVE 08/21/2019 2254   BILIRUBINUR negative 12/18/2017 1643   BILIRUBINUR neg 01/25/2015 [place] 08/21/2019 2254   PROTEINUR NEGATIVE 08/21/2019 2254   UROBILINOGEN 0.2 12/18/2017 1643   UROBILINOGEN 0.2 04/15/2014 1401   NITRITE NEGATIVE 08/21/2019 2254   LEUKOCYTESUR NEGATIVE 08/21/2019 2254    Radiological Exams  on Admission: DG Chest 2 View  Result Date: 08/21/2019 CLINICAL DATA:  69 year old female with shortness of breath. EXAM: CHEST - 2 VIEW COMPARISON:  Chest radiograph dated 07/25/2019. FINDINGS: Small bilateral pleural effusions with bibasilar atelectasis. Infiltrate is not excluded. Clinical correlation is recommended. Background of emphysema. No pneumothorax. The cardiac silhouette is within normal limits. Atherosclerotic calcification of the aorta. No acute osseous pathology.  IMPRESSION: Small bilateral pleural effusions and bibasilar atelectasis versus infiltrate. Electronically Signed   By: Anner Crete M.D.   On: 08/21/2019 20:56    EKG: Independently reviewed.  Assessment/Plan  Bilateral pleural effusion will obtain echocardiogram since she does not appear to have every had cardiac workup for her LE edema  BNP is ambiguous Monitor Is and Os  LE edema with hx of PAD/weakness Edema apparents to be chronic for many months followed by vascular surgery. Does not think her edema is worse but unsure why she is having worsening weakness Had recent negative DVT ultrasound on 08/03/19- no signs to warrant repeat at this time Will have PT evaluate  Elevated troponin 147. Suspect could be due to AKI. Continue to trend EKG  Hyponatremia Monitor for now with diuretic  Hypokalemia replete. Normal Mg.  Transaminitis suspect from vascular congestion  AKI could be due to vascular congestion and a combination of her having two NSAIDS on her med list for pain will give 40IV lasix once to see if that helps with creatinine and bilateral effusions monitor closely   COPD with chronic hypoxemia Not in acute exacerbation baseline on 3-4L via    Depression/anxiety Continue Buspar and Ambien PRN  Failure to thrive  consult dietician  DVT prophylaxis:.Lovenox Code Status: Full Family Communication: Plan discussed with patient at bedside  disposition Plan: Home with observation Consults called:  Admission status: Observation  Estes Lehner T Norah Fick DO [facility]   If 7PM-7AM, please contact night-coverage [email or web address]   08/22/2019, 12:59 AM

## 2019-08-22 NOTE — ED Notes (Signed)
This RN accidentally validated an SpO2 of 69%, pt's oxygen levels have been in the 90s

## 2019-08-22 NOTE — Progress Notes (Signed)
  Echocardiogram 2D Echocardiogram has been performed.  Michiel Cowboy 08/22/2019, 12:31 PM

## 2019-08-22 NOTE — Progress Notes (Signed)
Lovenox per Pharmacy for DVT Prophylaxis    Pharmacy has been consulted from dosing enoxaparin (lovenox) in this patient for DVT prophylaxis.  The pharmacist has reviewed pertinent labs (Hgb _14.1__; PLT_273__), patient weight (_39.5__kg) and renal function (CrCl_16__mL/min) and decided that enoxaparin _30_mg SQ Q24Hrs is appropriate for this patient.  The pharmacy department will sign off at this time.  Please reconsult pharmacy if status changes or for further issues.  Thank you  Cyndia Diver PharmD, BCPS  08/22/2019, 12:51 AM

## 2019-08-23 LAB — COMPREHENSIVE METABOLIC PANEL
ALT: 59 U/L — ABNORMAL HIGH (ref 0–44)
AST: 82 U/L — ABNORMAL HIGH (ref 15–41)
Albumin: 1.6 g/dL — ABNORMAL LOW (ref 3.5–5.0)
Alkaline Phosphatase: 202 U/L — ABNORMAL HIGH (ref 38–126)
Anion gap: 9 (ref 5–15)
BUN: 45 mg/dL — ABNORMAL HIGH (ref 8–23)
CO2: 28 mmol/L (ref 22–32)
Calcium: 7.4 mg/dL — ABNORMAL LOW (ref 8.9–10.3)
Chloride: 93 mmol/L — ABNORMAL LOW (ref 98–111)
Creatinine, Ser: 1.52 mg/dL — ABNORMAL HIGH (ref 0.44–1.00)
GFR calc Af Amer: 41 mL/min — ABNORMAL LOW (ref >60)
GFR calc non Af Amer: 35 mL/min — ABNORMAL LOW (ref >60)
Glucose, Bld: 94 mg/dL (ref 70–99)
Potassium: 5.3 mmol/L — ABNORMAL HIGH (ref 3.5–5.1)
Sodium: 130 mmol/L — ABNORMAL LOW (ref 135–145)
Total Bilirubin: 0.5 mg/dL (ref 0.3–1.2)
Total Protein: 4 g/dL — ABNORMAL LOW (ref 6.5–8.1)

## 2019-08-23 LAB — CBC
HCT: 38.5 % (ref 36.0–46.0)
Hemoglobin: 13 g/dL (ref 12.0–15.0)
MCH: 32.3 pg (ref 26.0–34.0)
MCHC: 33.8 g/dL (ref 30.0–36.0)
MCV: 95.5 fL (ref 80.0–100.0)
Platelets: 116 K/uL — ABNORMAL LOW (ref 150–400)
RBC: 4.03 MIL/uL (ref 3.87–5.11)
RDW: 15.7 % — ABNORMAL HIGH (ref 11.5–15.5)
WBC: 16.9 K/uL — ABNORMAL HIGH (ref 4.0–10.5)
nRBC: 0 % (ref 0.0–0.2)

## 2019-08-23 MED ORDER — SODIUM ZIRCONIUM CYCLOSILICATE 5 G PO PACK
5.0000 g | PACK | Freq: Two times a day (BID) | ORAL | Status: AC
  Administered 2019-08-23 (×2): 5 g via ORAL
  Filled 2019-08-23 (×2): qty 1

## 2019-08-23 MED ORDER — FLUCONAZOLE 100 MG PO TABS
100.0000 mg | ORAL_TABLET | Freq: Every day | ORAL | Status: AC
  Administered 2019-08-23 – 2019-08-25 (×3): 100 mg via ORAL
  Filled 2019-08-23 (×3): qty 1

## 2019-08-23 MED ORDER — NYSTATIN 100000 UNIT/ML MT SUSP
5.0000 mL | Freq: Four times a day (QID) | OROMUCOSAL | Status: DC
  Administered 2019-08-23 – 2019-08-26 (×11): 500000 [IU] via ORAL
  Filled 2019-08-23 (×11): qty 5

## 2019-08-23 MED ORDER — DIPHENHYDRAMINE HCL 50 MG/ML IJ SOLN
12.5000 mg | Freq: Once | INTRAMUSCULAR | Status: AC
  Administered 2019-08-23: 12.5 mg via INTRAVENOUS
  Filled 2019-08-23: qty 1

## 2019-08-23 MED ORDER — CHLORHEXIDINE GLUCONATE CLOTH 2 % EX PADS
6.0000 | MEDICATED_PAD | Freq: Every day | CUTANEOUS | Status: DC
  Administered 2019-08-23 – 2019-08-25 (×3): 6 via TOPICAL

## 2019-08-23 MED ORDER — DIPHENHYDRAMINE HCL 25 MG PO CAPS
25.0000 mg | ORAL_CAPSULE | Freq: Four times a day (QID) | ORAL | Status: DC | PRN
  Administered 2019-08-24: 25 mg via ORAL
  Filled 2019-08-23: qty 1

## 2019-08-23 NOTE — Progress Notes (Signed)
I responded to consult for an AD. Pt asked about doing a living will. I shared with her that we offer and HCPOA. She had one sitting on her table. I gave her an overview of fill it out. She said she has been in conversation with her roommate about legal matters and will discuss the HCPOA with her. She said that she needs her help in filing it out because she has difficulty with her sight.   Chaplain Resident Fidel Levy [phone]

## 2019-08-23 NOTE — Progress Notes (Addendum)
  Speech Language Pathology Treatment: Dysphagia  Patient Details Name: [name] MRN: [id number] DOB: 07/10/1950 Today's Date: 08/23/2019 Time: 1017-5102 SLP Time Calculation (min) (ACUTE ONLY): 18 min  Assessment / Plan / Recommendation Clinical Impression  No indications of aspiration with all po intake observed including Boost Breeze, water, pudding and graham crackers.  She is able to [facility] and clear soft solids easily with adequate clearance.  She does have extensive esophageal hx including Barrett's, gastro paresis, etc.   Pt also noted to have extensive oral candidiasis - notified MD and reviewed pt's thrush with RN,NT.   Advised pt of importance to swish and spit with water after using Albuterol as it can cause thrush and she had no symptoms today with extensive oral candidiasis.  SLP wrote a note to remind pt of need to swish and expectorate = though she is blind.  Advised her to take her note with her to remind her/family of importance as pt with known memory deficit.   Pt denies dysphagia except to large pills. She does admit she can not eat a large amount due to her stomach issues including gastroparesis and Barrett's esophagus. Pt aware of foods she can manage and reports she only wants "mashed potatoes".  Recommend advance diet to regular/thin so pt can choose what she desires and can manage eating.All education completed, No follow up needed.       HPI HPI: [name] is a 69 y.o. female with medical history significant for CAD, COPD on home 3-4L, Barrett's esophagus, gastroparesis due to hemigastrectomy, anxiety/depression, chronic narcotic use who presents with shortness of breath and decrease ambulation.  Chest x-ray shows small bilateral pleural effusions and bibasilar atelectasis versus infiltrate.      SLP Plan  All goals met       Recommendations  Diet recommendations: Regular;Thin liquid Medication Administration: Crushed with puree(or whole if  small) Supervision: Patient able to self feed Compensations: Minimize environmental distractions;Slow rate;Small sips/bites Postural Changes and/or Swallow Maneuvers: Seated upright 90 degrees;Upright 30-60 min after meal                Oral Care Recommendations: Oral care BID Follow up Recommendations: Other (comment)(TBD) SLP Visit Diagnosis: Dysphagia, unspecified (R13.10) Plan: All goals met       GO                Macario Golds 08/23/2019, 5:27 PM   Kathleen Lime, MS [place] Office [phone]

## 2019-08-23 NOTE — Progress Notes (Signed)
Bladder scanned for 663, has not produced any urine on her own since previous in and out cath and after evening dose of IV Lasix. Foley catheter inserted per MD verbal order given to RN Archer Asa on dayshift (order will need placed as it was not entered), will continue to monitor

## 2019-08-23 NOTE — Progress Notes (Addendum)
PROGRESS NOTE    [name]  [id number] DOB: December 29, 1950 DOA: 08/21/2019 PCP: Simona Huh, NP  Brief Narrative:69 y.o. female with medical history significant for CAD, COPD on home 3-4L, Barrett's esophagus, gastroparesis due to hemigastrectomy, anxiety/depression, chronic narcotic use who presents with shortness of breath and decrease ambulation.  Patient says that she has been having lower extremity swelling up to her hips for months and has had progressive weakness to the point where today she can no longer walk. Has pain to bilateral thigh. Endorsed compliant with her diuretic.  She also had increased shortness of breath both at rest and exertion.  Normally on 3 to 4 L of oxygen at home.  She is not able to tell me whether she has been having any orthopnea but she has to recline when she sleeps.  Denies any chest pain.  Patient otherwise is a poor historian and unable to recall much other history or symptoms.  Patient has been evaluated in the ED several times in the last 2 months for similar symptoms in last had a negative lower extremity ultrasound on 08/03/2019. She also saw vascular surgery on 08/04/2019 and it was noted that she had evidence of leg swelling symptoms dating back to 2009.  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  In the ED, she was afebrile with borderline blood pressure of around 102 over 80s and was on her normal home 3 L via nasal cannula. Lab work showed mild leukocytosis of 12.6.  Sodium of 130, potassium of 2.5, glucose of 118, creatinine elevated at 2.10 from a prior baseline of 0.78.  AST elevated at 97 and ALT at 60, alkaline phosphatase of 208.  BNP of 174.  Troponin elevated at 147.  Chest x-ray shows small bilateral pleural effusions and bibasilar atelectasis versus infiltrate.  Assessment & Plan:   Principal Problem:   Weakness Active  Problems:   Anxiety state   COPD (chronic obstructive pulmonary disease) (HCC)   AKI (acute kidney injury) (HCC)   Hypokalemia   Hyponatremia   Transaminitis   Edema of lower extremity   PAD (peripheral artery disease) (HCC)   Elevated troponin   Failure to thrive in adult    #1 anasarca with bilateral lower extremity edema-more than likely secondary to severe hypoalbuminemia albumin 1.6. Seen by dietary follow the recommendations noted. She does have chronic bilateral lower extremity edema for which she takes diuretics at home.  She also has peripheral vascular disease followed by vascular surgery. Echo this admission ejection fraction 50 to 55%.  No regional wall motion abnormalities.  No evidence of cardiac tamponade.  #2 severe deconditioning with failure to thrive patient seen by physical therapy recommends SNF.  #3 very mildly elevated troponin with downward trend with no acute EKG changes   #4 O2 dependent COPD patient on 3 to 4 L of oxygen at home she also continues to smoke.  #5 history of depression anxiety and insomnia on BuSpar and as needed Ambien.  #6 multiple electrolyte abnormalities hyponatremia and hypokalemia  #7 colitis by CT scan but patient asymptomatic no nausea vomiting diarrhea reported.  She has darkish discoloration on her abdominal wall from hot water bag which is chronic.  #8 chronic pain/chronic left lower extremity pain needs PT and SNF.  Continue pain medications as prior.  #9 fatty liver CT of the abdomen shows severe anasarca small ascites and bilateral pleural  effusions with fatty infiltration of the liver.  Findings of colitis also noted but patient asymptomatic. Alkaline phosphatase AST and ALT trending down.  #10 leukocytosis unclear reason no evidence of infection so far.  #11 hyperkalemia from over replacement DC potassium.  #12 acute urinary retention Foley placed UA negative urine culture pending     Nutrition Problem: Inadequate  oral intake Etiology: acute illness, poor appetite     Signs/Symptoms: per patient/family report, meal completion < 25%    Interventions: Ensure Enlive (each supplement provides 350kcal and 20 grams of protein), Boost Breeze, Magic cup, MVI  Estimated body mass index is 15.17 kg/m as calculated from the following:   Height as of this encounter: 5' 3.5" (1.613 m).   Weight as of this encounter: 39.5 kg.  DVT prophylaxis: Lovenox Code Status: Full code  family Communication: None  disposition Plan: Patient admitted from home very deconditioned with failure to thrive plan seen by physical therapy recommending discharge to SNF waiting for OT evaluation.  Barriers to discharge-finding SNF bed Consultants:   None  Procedures: None Antimicrobials: None  Subjective: Resting in bed better than yesterday however still very weak Swelling all the way up to the lower abdominal wall Objective: Vitals:   08/22/19 1314 08/22/19 1816 08/22/19 2110 08/23/19 0445  BP: 115/80 113/83 (!) 123/99 111/72  Pulse: 95 99 (!) 110 91  Resp:  (!) 24 18 16   Temp: (!) 97.5 F (36.4 C) 99.1 F (37.3 C) 98.1 F (36.7 C) 98.4 F (36.9 C)  TempSrc: Oral Oral Oral Oral  SpO2:   95% 99%  Weight:      Height:        Intake/Output Summary (Last 24 hours) at 08/23/2019 1348 Last data filed at 08/23/2019 [id number] Gross per 24 hour  Intake 780 ml  Output 1750 ml  Net -970 ml   Filed Weights   08/21/19 2134  Weight: 39.5 kg    Examination:  General exam: Appears calm and comfortable  Respiratory system: Clear to auscultation. Respiratory effort normal. Cardiovascular system: S1 & S2 heard, RRR. No JVD, murmurs, rubs, gallops or clicks. No pedal edema. Gastrointestinal system: Abdomen is nondistended, soft and nontender. No organomegaly or masses felt. Normal bowel sounds heard. Central nervous system: Alert and oriented. No focal neurological deficits. Extremities: 2+ edema extending up to the lower  abdominal wall. Skin: No rashes, lesions or ulcers Psychiatry: Judgement and insight appear normal. Mood & affect appropriate.     Data Reviewed: I have personally reviewed following labs and imaging studies  CBC: Recent Labs  Lab 08/21/19 2132 08/22/19 0605 08/23/19 0152  WBC 12.6* 17.1* 16.9*  NEUTROABS 11.1*  --   --   HGB 14.1 13.4 13.0  HCT 41.4 40.3 38.5  MCV 95.0 97.1 95.5  PLT 273 244 [id number]*   Basic Metabolic Panel: Recent Labs  Lab 08/21/19 2132 08/22/19 0605 08/23/19 0518  NA 130* 131* 130*  K 2.5* 3.4* 5.3*  CL 90* 93* 93*  CO2 25 24 28   GLUCOSE 118* 80 94  BUN 50* 47* 45*  CREATININE 2.10* 1.90* 1.52*  CALCIUM 7.3* 7.3* 7.4*  MG 2.1  --   --    GFR: Estimated Creatinine Clearance: 22.4 mL/min (A) (by C-G formula based on SCr of 1.52 mg/dL (H)). Liver Function Tests: Recent Labs  Lab 08/21/19 2132 08/22/19 0605 08/23/19 0518  AST 97* 97* 82*  ALT 60* 61* 59*  ALKPHOS 208* 213* 202*  BILITOT 0.9 0.9 0.5  PROT  4.1* 4.1* 4.0*  ALBUMIN 1.7* 1.7* 1.6*   No results for input(s): LIPASE, AMYLASE in the last 168 hours. No results for input(s): AMMONIA in the last 168 hours. Coagulation Profile: No results for input(s): INR, PROTIME in the last 168 hours. Cardiac Enzymes: No results for input(s): CKTOTAL, CKMB, CKMBINDEX, TROPONINI in the last 168 hours. BNP (last 3 results) No results for input(s): PROBNP in the last 8760 hours. HbA1C: No results for input(s): HGBA1C in the last 72 hours. CBG: No results for input(s): GLUCAP in the last 168 hours. Lipid Profile: No results for input(s): CHOL, HDL, LDLCALC, TRIG, CHOLHDL, LDLDIRECT in the last 72 hours. Thyroid Function Tests: No results for input(s): TSH, T4TOTAL, FREET4, T3FREE, THYROIDAB in the last 72 hours. Anemia Panel: No results for input(s): VITAMINB12, FOLATE, FERRITIN, TIBC, IRON, RETICCTPCT in the last 72 hours. Sepsis Labs: No results for input(s): PROCALCITON, LATICACIDVEN in the  last 168 hours.  Recent Results (from the past 240 hour(s))  SARS CORONAVIRUS 2 (TAT 6-24 HRS) Nasopharyngeal Nasopharyngeal Swab     Status: None   Collection Time: 08/21/19 10:51 PM   Specimen: Nasopharyngeal Swab  Result Value Ref Range Status   SARS Coronavirus 2 NEGATIVE NEGATIVE Final    Comment: (NOTE) SARS-CoV-2 target nucleic acids are NOT DETECTED. The SARS-CoV-2 RNA is generally detectable in upper and lower respiratory specimens during the acute phase of infection. Negative results do not preclude SARS-CoV-2 infection, do not rule out co-infections with other pathogens, and should not be used as the sole basis for treatment or other patient management decisions. Negative results must be combined with clinical observations, patient history, and epidemiological information. The expected result is Negative. Fact Sheet for Patients: SugarRoll.be Fact Sheet for Healthcare Providers: https://www.woods-.com/ This test is not yet approved or cleared by the [place] FDA and  has been authorized for detection and/or diagnosis of SARS-CoV-2 by FDA under an Emergency Use Authorization (EUA). This EUA will remain  in effect (meaning this test can be used) for the duration of the COVID-19 declaration under Section 56 4(b)(1) of the Act, 21 U.S.C. section 360bbb-3(b)(1), unless the authorization is terminated or revoked sooner. Performed at [facility], [place] [address], [place], [place] [address]          Radiology Studies: CT ABDOMEN PELVIS WO CONTRAST  Result Date: 08/22/2019 CLINICAL DATA:  69 year old female with diffuse abdominal pain and distention. EXAM: CT ABDOMEN AND PELVIS WITHOUT CONTRAST TECHNIQUE: Multidetector CT imaging of the abdomen and pelvis was performed following the standard protocol without IV contrast. COMPARISON:  CT of the abdomen pelvis dated 06/23/2014. FINDINGS: Evaluation of this exam is  limited in the absence of intravenous contrast. Lower chest: Small right and moderate left pleural effusions with associated partial compressive atelectasis of the lower lobes. Coronary vascular calcification and partially visualized pericardial effusion measuring 6 mm in thickness. There is no intra-abdominal free air. There is a small ascites. Hepatobiliary: Advanced fatty infiltration of the liver. No intrahepatic biliary ductal dilatation. Cholecystectomy. Pancreas: Mildly atrophic pancreas. Spleen: Normal in size without focal abnormality. Adrenals/Urinary Tract: The adrenal glands are unremarkable. There is a 5 mm nonobstructing left renal upper pole calculus. There is a 2.5 cm left renal upper pole cyst. There is no hydronephrosis on either side. The right kidney is unremarkable. The urinary bladder is grossly unremarkable. Stomach/Bowel: Postsurgical changes of gastric bypass. There is diffuse thickening and edema of the: Which may be related to ascites and anasarca but concerning for  colitis. Clinical correlation is recommended. There is no bowel obstruction. The appendix is not visualized with certainty. Vascular/Lymphatic: Advanced aortoiliac atherosclerotic disease. The IVC is unremarkable. No portal venous gas. There is no adenopathy. There is diffuse mesenteric edema. Reproductive: The uterus is suboptimally visualized. Other: Severe diffuse subcutaneous edema and anasarca. Musculoskeletal: Degenerative changes of the spine. Bilateral L5 pars defects with grade 1 L5-S1 anterolisthesis. Osteopenia. Age indeterminate fracture of the superior endplate of 624THL, new since the prior CT of 2015. Correlation with clinical exam and point tenderness recommended. IMPRESSION: 1. Findings concerning for colitis. Clinical correlation is recommended. No bowel obstruction. 2. Severe anasarca, small ascites, and bilateral pleural effusions. 3. Advanced fatty infiltration of the liver. 4. A 5 mm nonobstructing left renal  upper pole calculus. No hydronephrosis. 5. Age indeterminate fracture of the superior endplate of 624THL, new since 2015. Correlation with clinical exam and point tenderness recommended. 6. Aortic Atherosclerosis (ICD10-I70.0). Electronically Signed   By: Anner Crete M.D.   On: 08/22/2019 18:20   DG Chest 2 View  Result Date: 08/21/2019 CLINICAL DATA:  69 year old female with shortness of breath. EXAM: CHEST - 2 VIEW COMPARISON:  Chest radiograph dated 07/25/2019. FINDINGS: Small bilateral pleural effusions with bibasilar atelectasis. Infiltrate is not excluded. Clinical correlation is recommended. Background of emphysema. No pneumothorax. The cardiac silhouette is within normal limits. Atherosclerotic calcification of the aorta. No acute osseous pathology. IMPRESSION: Small bilateral pleural effusions and bibasilar atelectasis versus infiltrate. Electronically Signed   By: Anner Crete M.D.   On: 08/21/2019 20:56   ECHOCARDIOGRAM COMPLETE  Result Date: 08/22/2019    ECHOCARDIOGRAM REPORT   Patient Name:   [name] Date of Exam: 08/22/2019 Medical Rec #:  [id number]      Height:       63.5 in Accession #:    [id number]     Weight:       87.0 lb Date of Birth:  11/03/1950       BSA:          1.365 m Patient Age:    34 years       BP:           104/60 mmHg Patient Gender: F              HR:           88 bpm. Exam Location:  Inpatient Procedure: 2D Echo, Cardiac Doppler and Color Doppler Indications:    Elevated troponin  History:        Patient has no prior history of Echocardiogram examinations.                 CAD, COPD; Risk Factors:Current Smoker. Elevated troponin. PAD.  Sonographer:    Sharion Dove Referring Phys: [id number] [place] T TU  Sonographer Comments: Image acquisition challenging due to patient body habitus. IMPRESSIONS  1. Left ventricular ejection fraction, by estimation, is 50 to 55%. The left ventricle has low normal function. The left ventricle has no regional wall motion  abnormalities. Left ventricular diastolic parameters are consistent with Grade I diastolic dysfunction (impaired relaxation).  2. Right ventricular systolic function is low normal. The right ventricular size is normal.  3. Small to moderate. There is no evidence of cardiac tamponade.  4. The mitral valve is abnormal. Trivial mitral valve regurgitation.  5. Tricuspid valve regurgitation is moderate.  6. The aortic valve is tricuspid. Aortic valve regurgitation is mild. Mild to moderate aortic valve sclerosis/calcification is present, without any evidence  of aortic stenosis.  7. The inferior vena cava is normal in size with greater than 50% respiratory variability, suggesting right atrial pressure of 3 mmHg. Conclusion(s)/Recommendation(s): Poor acoustic windows limit study. FINDINGS  Left Ventricle: Left ventricular ejection fraction, by estimation, is 50 to 55%. The left ventricle has low normal function. The left ventricle has no regional wall motion abnormalities. The left ventricular internal cavity size was normal in size. There is no left ventricular hypertrophy. Left ventricular diastolic parameters are consistent with Grade I diastolic dysfunction (impaired relaxation). Right Ventricle: The right ventricular size is normal. No increase in right ventricular wall thickness. Right ventricular systolic function is low normal. Left Atrium: Left atrial size was normal in size. Right Atrium: Right atrial size was normal in size. Pericardium: Small pericardial effusion. No evidence for hemodynamic compromise by echo criteria. Small to moderate. There is no evidence of cardiac tamponade. Presence of pericardial fat pad. Mitral Valve: The mitral valve is abnormal. There is mild thickening of the mitral valve leaflet(s). Trivial mitral valve regurgitation. Tricuspid Valve: The tricuspid valve is normal in structure. Tricuspid valve regurgitation is moderate. Aortic Valve: The aortic valve is tricuspid. Aortic valve  regurgitation is mild. Mild to moderate aortic valve sclerosis/calcification is present, without any evidence of aortic stenosis. Pulmonic Valve: The pulmonic valve was normal in structure. Pulmonic valve regurgitation is not visualized. Aorta: The aortic root is normal in size and structure. Venous: The inferior vena cava is normal in size with greater than 50% respiratory variability, suggesting right atrial pressure of 3 mmHg. IAS/Shunts: No atrial level shunt detected by color flow Doppler.  LEFT VENTRICLE PLAX 2D LVIDd:         2.60 cm     Diastology LVIDs:         2.30 cm     LV e' lateral:   6.74 cm/s LV PW:         0.70 cm     LV E/e' lateral: 7.9 LV IVS:        0.70 cm     LV e' medial:    5.77 cm/s LVOT diam:     1.40 cm     LV E/e' medial:  9.2 LV SV:         11 LV SV Index:   8 LVOT Area:     1.54 cm  LV Volumes (MOD) LV vol d, MOD A4C: 26.9 ml LV vol s, MOD A4C: 15.0 ml LV SV MOD A4C:     26.9 ml RIGHT VENTRICLE RV S prime:     6.53 cm/s TAPSE (M-mode): 1.0 cm LEFT ATRIUM           Index      RIGHT ATRIUM          Index LA diam:      1.70 cm 1.24 cm/m RA Area:     3.52 cm LA Vol (A4C): 5.5 ml  4.01 ml/m RA Volume:   4.65 ml  3.41 ml/m  AORTIC VALVE LVOT Vmax:   58.70 cm/s LVOT Vmean:  34.700 cm/s LVOT VTI:    0.072 m  AORTA Ao Root diam: 2.50 cm MITRAL VALVE               TRICUSPID VALVE MV Area (PHT): 3.06 cm    TR Peak grad:   22.3 mmHg MV Decel Time: 248 msec    TR Vmax:        236.00 cm/s MV E velocity: 53.10 cm/s MV A  velocity: 63.40 cm/s  SHUNTS MV E/A ratio:  0.84        Systemic VTI:  0.07 m                            Systemic Diam: 1.40 cm Dorris Carnes MD Electronically signed by Dorris Carnes MD Signature Date/Time: 08/22/2019/1:48:51 PM    Final    US Abdomen Limited RUQ  Result Date: 08/22/2019 CLINICAL DATA:  69 year old female with abdominal pain. History of cholecystectomy. EXAM: ULTRASOUND ABDOMEN LIMITED RIGHT UPPER QUADRANT COMPARISON:  10/17/2017 ultrasound FINDINGS: Gallbladder:  The gallbladder is not visualized compatible with cholecystectomy. Common bile duct: Diameter: 3 mm. No intrahepatic or extrahepatic biliary dilatation identified. Liver: Increased hepatic echogenicity noted without focal hepatic abnormality. Portal vein is patent on color Doppler imaging with normal direction of blood flow towards the liver. Other: Trace amount of perihepatic ascites is noted. IMPRESSION: 1. No evidence of acute abnormality. 2. Hepatic steatosis. 3. Status post cholecystectomy.  No biliary dilatation. Electronically Signed   By: Margarette Canada M.D.   On: 08/22/2019 10:52        Scheduled Meds: . Chlorhexidine Gluconate Cloth  6 each Topical Daily  . enoxaparin (LOVENOX) injection  30 mg Subcutaneous QHS  . feeding supplement  1 Container Oral Q24H  . feeding supplement (ENSURE ENLIVE)  237 mL Oral BID BM  . gabapentin  200 mg Oral TID  . mouth rinse  15 mL Mouth Rinse BID  . metoCLOPramide  5 mg Oral TID AC & HS  . multivitamin with minerals  1 tablet Oral Daily  . nicotine  21 mg Transdermal Daily   Continuous Infusions:   LOS: 1 day    Georgette Shell, MD  08/23/2019, 1:48 PM

## 2019-08-23 NOTE — Plan of Care (Signed)

## 2019-08-23 NOTE — NC FL2 (Signed)
[place] MEDICAID FL2 LEVEL OF CARE SCREENING TOOL     IDENTIFICATION  Patient Name: [name] Birthdate: 1951/03/25 Sex: female Admission Date (Current Location): 08/21/2019  [facility] [place] Number:  Herbalist and Address:  [facility],  [place], [place]      Provider Number: [id number]  Attending Physician Name and Address:  Georgette Shell, MD  Relative Name and Phone Number:  [name] friend [phone]    Current Level of Care: Hospital Recommended Level of Care: [place] Prior Approval Number:    Date Approved/Denied:   PASRR Number: [id number]  Discharge Plan: SNF    Current Diagnoses: Patient Active Problem List   Diagnosis Date Noted  . Weakness 08/22/2019  . AKI (acute kidney injury) ([place]) 08/22/2019  . Hypokalemia 08/22/2019  . Hyponatremia 08/22/2019  . Transaminitis 08/22/2019  . Edema of lower extremity 08/22/2019  . PAD (peripheral artery disease) ([place]) 08/22/2019  . Elevated troponin 08/22/2019  . Failure to thrive in adult 08/22/2019  . Abdominal pain, chronic, epigastric 05/31/2017  . Episodic polysubstance dependence ([place]) 05/31/2017  . At high risk for injury related to fall 05/18/2017  . Chronic nausea 05/18/2017  . Poor compliance with medication 11/20/2016  . Insomnia 11/20/2016  . Early onset macular degeneration 11/20/2016  . Legally blind 11/20/2016  . Abdominal pain   . Hypoxia 04/08/2015  . Opioid type dependence, continuous ([place])   . Protein-calorie malnutrition ([place])   . Episode of recurrent major depressive disorder ([place])   . Secondary hyperparathyroidism ([place]) 01/10/2015  . Vitamin D deficiency 01/10/2015  . 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 Anemia, iron deficiency 11/03/2014  . Chronic pain syndrome 10/24/2014  . Osteoporosis 10/05/2014  . Vertebral compression fracture ([place]) 10/05/2014  . Coronary artery disease due to  calcified coronary lesion 10/05/2014  . Weight loss   . Tobacco abuse   . History of subtotal gastrectomy with Roux-en-Y recontruction   . Orthostatic hypotension 08/11/2014  . Memory loss, short term 07/18/2014  . Constipation, chronic 07/10/2013  . Chronic abdominal pain   . COPD (chronic obstructive pulmonary disease) ([place]) 11/07/2010  . Major depressive disorder, recurrent episode, moderate ([place]) 06/06/2010  . 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 Anxiety state 01/07/2007  . Barrett's esophagus 07/03/2005    Orientation RESPIRATION BLADDER Height & Weight     Self, Time, Situation, Place  O2(02 3-4l [place] continuous) Continent Weight: 39.5 kg Height:  5' 3.5" (161.3 cm)  BEHAVIORAL SYMPTOMS/MOOD NEUROLOGICAL BOWEL NUTRITION STATUS      Continent Diet(Regular)  AMBULATORY STATUS COMMUNICATION OF NEEDS Skin   Limited Assist Verbally Normal                       Personal Care Assistance Level of Assistance  Bathing, Feeding, Dressing Bathing Assistance: Limited assistance Feeding assistance: Limited assistance Dressing Assistance: Limited assistance     Functional Limitations Info  Sight(macular degeneration-limited) Sight Info: Impaired(macular degeneration-limited)        SPECIAL CARE FACTORS FREQUENCY  PT (By licensed PT), OT (By licensed OT)     PT Frequency: 5x week OT Frequency: 5x week            Contractures Contractures Info: Not present    Additional Factors Info  Code Status, Allergies Code Status Info: Full code Allergies Info: Lithium, Celebrex Celecoxib, Quetiapine, Varenicline Tartrate, Morphine  Current Medications (08/23/2019):  This is the current hospital active medication list Current Facility-Administered Medications  Medication Dose Route Frequency Provider Last Rate Last Admin  . acetaminophen (TYLENOL) tablet 500 mg  500 mg Oral Q6H PRN Tu, Ching T, DO   500 mg at  08/23/19 0809  . albuterol (PROVENTIL) (2.5 MG/3ML) 0.083% nebulizer solution 2.5 mg  2.5 mg Nebulization Q6H PRN Tu, Ching T, DO      . Chlorhexidine Gluconate Cloth 2 % PADS 6 each  6 each Topical Daily Georgette Shell, MD      . enoxaparin (LOVENOX) injection 30 mg  30 mg Subcutaneous QHS Tu, Ching T, DO   30 mg at 08/22/19 2030  . feeding supplement (BOOST / RESOURCE BREEZE) liquid 1 Container  1 Container Oral Q24H Georgette Shell, MD   1 Container at 08/23/19 1008  . feeding supplement (ENSURE ENLIVE) (ENSURE ENLIVE) liquid 237 mL  237 mL Oral BID BM Georgette Shell, MD   237 mL at 08/22/19 2031  . gabapentin (NEURONTIN) capsule 200 mg  200 mg Oral TID Tu, Ching T, DO   200 mg at 08/23/19 1006  . HYDROmorphone (DILAUDID) injection 0.5 mg  0.5 mg Intravenous Once PRN Georgette Shell, MD      . MEDLINE mouth rinse  15 mL Mouth Rinse BID Tu, Ching T, DO   15 mL at 08/23/19 1007  . metoCLOPramide (REGLAN) tablet 5 mg  5 mg Oral TID AC & HS Tu, Ching T, DO   5 mg at 08/23/19 1007  . multivitamin with minerals tablet 1 tablet  1 tablet Oral Daily Georgette Shell, MD   1 tablet at 08/23/19 1006  . nicotine (NICODERM CQ - dosed in mg/24 hours) patch 21 mg  21 mg Transdermal Daily Georgette Shell, MD   21 mg at 08/23/19 1004  . oxyCODONE (Oxy IR/ROXICODONE) immediate release tablet 5 mg  5 mg Oral Q6H PRN Tu, Ching T, DO   5 mg at 08/23/19 0809  . zolpidem (AMBIEN) tablet 5 mg  5 mg Oral QHS PRN Neila Gear, NP   5 mg at 08/22/19 2040     Discharge Medications: Please see discharge summary for a list of discharge medications.  Relevant Imaging Results:  Relevant Lab Results:   Additional Information ss[address][id number], [place], [place]

## 2019-08-23 NOTE — Progress Notes (Signed)
Physical Therapy Treatment Patient Details Name: [name] MRN: [id number] DOB: 1950/12/14 Today's Date: 08/23/2019    History of Present Illness Pt admitted with SOB, weakness and increased LE edema and dx with bil pleural effusion.  Pt with hx of COPD, macular degeneration, Gastric outlet syndrome, L hip fx (10), R hip fx (15), osteoporosis, CAD and chronic LBP    PT Comments    Pt eager to mobilize OOB.  Pt assisted with standing x3 and repositioned to comfort in recliner.  Continue to recommend SNF upon d/c.    Follow Up Recommendations  SNF     Equipment Recommendations  None recommended by PT    Recommendations for Other Services       Precautions / Restrictions Precautions Precautions: Fall Precaution Comments: chronic 3L O2    Mobility  Bed Mobility Overal bed mobility: Needs Assistance Bed Mobility: Supine to Sit     Supine to sit: Min assist     General bed mobility comments: increased time with use of bedrail and assist to manage LEs due to pain  Transfers Overall transfer level: Needs assistance Equipment used: Rolling walker (2 wheeled) Transfers: Sit to/from [facility] Sit to Stand: Mod assist Stand pivot transfers: Mod assist       General transfer comment: verbal cues for safe technique, performed sit to stand from bed, then stand pivot and then standing again from recliner for strengthening and technique.  remained on 3L O2 [place], SPO2 reading 77% after transfers however quickly improved to 90% (monitor on finger and pt gripping RW as well), pt denies SOB with transfers  Ambulation/Gait                 Stairs             Wheelchair Mobility    Modified Rankin (Stroke Patients Only)       Balance Overall balance assessment: Needs assistance         Standing balance support: Bilateral upper extremity supported Standing balance-Leahy Scale: Poor Standing balance comment: reliant on UE and external  support                            Cognition Arousal/Alertness: Awake/alert Behavior During Therapy: WFL for tasks assessed/performed Overall Cognitive Status: Within Functional Limits for tasks assessed                                        Exercises      General Comments        Pertinent Vitals/Pain Pain Assessment: Faces Faces Pain Scale: Hurts even more Pain Location: Bil LEs with movement (L >R) Pain Descriptors / Indicators: Grimacing;Guarding;Sore;Tightness Pain Intervention(s): Repositioned;Monitored during session    Home Living                      Prior Function            PT Goals (current goals can now be found in the care plan section) Progress towards PT goals: Progressing toward goals    Frequency    Min 2X/week      PT Plan Current plan remains appropriate    Co-evaluation              AM-PAC PT "6 Clicks" Mobility   Outcome Measure  Help needed turning from your back  to your side while in a flat bed without using bedrails?: A Little Help needed moving from lying on your back to sitting on the side of a flat bed without using bedrails?: A Little Help needed moving to and from a bed to a chair (including a wheelchair)?: A Lot Help needed standing up from a chair using your arms (e.g., wheelchair or bedside chair)?: A Lot Help needed to walk in hospital room?: A Lot Help needed climbing 3-5 steps with a railing? : Total 6 Click Score: 13    End of Session Equipment Utilized During Treatment: Gait belt;Oxygen Activity Tolerance: Patient limited by fatigue;Patient limited by pain Patient left: with call bell/phone within reach;in chair;with chair alarm set Nurse Communication: Mobility status PT Visit Diagnosis: Muscle weakness (generalized) (M62.81);Difficulty in walking, not elsewhere classified (R26.2);Pain     Time: 1514-1530 PT Time Calculation (min) (ACUTE ONLY): 16 min  Charges:   $Therapeutic Activity: 8-22 mins                    Arlyce Dice, DPT Acute Rehabilitation Services Office: [phone]  Trena Platt 08/23/2019, 4:22 PM

## 2019-08-24 LAB — CBC
HCT: 31.7 % — ABNORMAL LOW (ref 36.0–46.0)
Hemoglobin: 10.5 g/dL — ABNORMAL LOW (ref 12.0–15.0)
MCH: 33.1 pg (ref 26.0–34.0)
MCHC: 33.1 g/dL (ref 30.0–36.0)
MCV: 100 fL (ref 80.0–100.0)
Platelets: 137 10*3/uL — ABNORMAL LOW (ref 150–400)
RBC: 3.17 MIL/uL — ABNORMAL LOW (ref 3.87–5.11)
RDW: 15.4 % (ref 11.5–15.5)
WBC: 11.9 10*3/uL — ABNORMAL HIGH (ref 4.0–10.5)
nRBC: 0 % (ref 0.0–0.2)

## 2019-08-24 LAB — COMPREHENSIVE METABOLIC PANEL
ALT: 56 U/L — ABNORMAL HIGH (ref 0–44)
AST: 74 U/L — ABNORMAL HIGH (ref 15–41)
Albumin: 1.4 g/dL — ABNORMAL LOW (ref 3.5–5.0)
Alkaline Phosphatase: 171 U/L — ABNORMAL HIGH (ref 38–126)
Anion gap: 8 (ref 5–15)
BUN: 31 mg/dL — ABNORMAL HIGH (ref 8–23)
CO2: 32 mmol/L (ref 22–32)
Calcium: 7.5 mg/dL — ABNORMAL LOW (ref 8.9–10.3)
Chloride: 88 mmol/L — ABNORMAL LOW (ref 98–111)
Creatinine, Ser: 0.94 mg/dL (ref 0.44–1.00)
GFR calc Af Amer: >60 mL/min (ref >60)
GFR calc non Af Amer: >60 mL/min (ref >60)
Glucose, Bld: 76 mg/dL (ref 70–99)
Potassium: 5.5 mmol/L — ABNORMAL HIGH (ref 3.5–5.1)
Sodium: 128 mmol/L — ABNORMAL LOW (ref 135–145)
Total Bilirubin: 0.7 mg/dL (ref 0.3–1.2)
Total Protein: 3.8 g/dL — ABNORMAL LOW (ref 6.5–8.1)

## 2019-08-24 MED ORDER — SODIUM ZIRCONIUM CYCLOSILICATE 10 G PO PACK
10.0000 g | PACK | Freq: Two times a day (BID) | ORAL | Status: AC
  Administered 2019-08-24 – 2019-08-25 (×4): 10 g via ORAL
  Filled 2019-08-24 (×4): qty 1

## 2019-08-24 NOTE — Evaluation (Signed)
Occupational Therapy Evaluation Patient Details Name: [name] MRN: [id number] DOB: March 13, 1951 Today'[name] Date: 08/24/2019    History of Present Illness Pt admitted with SOB, weakness and increased LE edema and dx with bil pleural effusion.  Pt with hx of COPD, macular degeneration, Gastric outlet syndrome, L hip fx (10), R hip fx (15), osteoporosis, CAD and chronic LBP   Clinical Impression   Pt was admitted for the above. At baseline, she needed increasingly more assistance from her roommate for adls. Pt currently needs set up to mod A. Will follow in acute setting with min A level goals     Follow Up Recommendations  SNF    Equipment Recommendations  3 in 1 bedside commode    Recommendations for Other Services       Precautions / Restrictions Precautions Precautions: Fall Precaution Comments: chronic 3L O2 Restrictions Weight Bearing Restrictions: No      Mobility Bed Mobility         Supine to sit: Min assist Sit to supine: Mod assist   General bed mobility comments: assist for legs in and out of bed due to pain  Transfers   Equipment used: 1 person hand held assist   Sit to Stand: Min assist;Mod assist              Balance                                           ADL either performed or assessed with clinical judgement   ADL Overall ADL'[name] : Needs assistance/impaired Eating/Feeding: Independent   Grooming: Set up   Upper Body Bathing: Set up   Lower Body Bathing: Moderate assistance   Upper Body Dressing : Minimal assistance   Lower Body Dressing: Moderate assistance                 General ADL Comments: sat EOB and sidestepped up [place].  Pt did not want to get up to chair     Vision         Perception     Praxis      Pertinent Vitals/Pain Faces Pain Scale: Hurts even more Pain Location: Bil LEs with movement (L >R) Pain Descriptors / Indicators: Grimacing;Guarding;Sore;Tightness Pain Intervention([name]):  Limited activity within patient'[name] tolerance;Monitored during session;Repositioned     Hand Dominance     Extremity/Trunk Assessment Upper Extremity Assessment Upper Extremity Assessment: Generalized weakness           Communication Communication Communication: No difficulties   Cognition Arousal/Alertness: Awake/alert Behavior During Therapy: WFL for tasks assessed/performed Overall Cognitive Status: Within Functional Limits for tasks assessed                                     General Comments       Exercises     Shoulder Instructions      Home Living Family/patient expects to be discharged to:: Unsure Living Arrangements: Non-relatives/Friends                                      Prior Functioning/Environment Level of Independence: Independent        Comments: Pt reports requiring increasing level of assist from roommate leading up to  admit        OT Problem List: Decreased strength;Decreased activity tolerance;Impaired balance (sitting and/or standing);Pain;Decreased knowledge of use of DME or AE      OT Treatment/Interventions: Self-care/ADL training;DME and/or AE instruction;Energy conservation;Therapeutic exercise;Balance training;Patient/family education;Therapeutic activities    OT Goals(Current goals can be found in the care plan section) Acute Rehab OT Goals Patient Stated Goal: less pain OT Goal Formulation: With patient Time For Goal Achievement: 09/07/19 Potential to Achieve Goals: Good ADL Goals Pt Will Transfer to Toilet: with min assist;bedside commode;ambulating Additional ADL Goal #1: pt will perform adl with min A Additional ADL Goal #2: pt will perform bed mobility at min guard level, using gait belt as needed as precursor to toilet transfers  OT Frequency: Min 2X/week   Barriers to D/C:            Co-evaluation              AM-PAC OT "6 Clicks" Daily Activity     Outcome Measure Help from  another person eating meals?: None Help from another person taking care of personal grooming?: A Little Help from another person toileting, which includes using toliet, bedpan, or urinal?: A Lot Help from another person bathing (including washing, rinsing, drying)?: A Lot Help from another person to put on and taking off regular upper body clothing?: A Little Help from another person to put on and taking off regular lower body clothing?: A Lot 6 Click Score: 16   End of Session    Activity Tolerance: Patient limited by fatigue;Patient limited by pain Patient left: in bed;with call bell/phone within reach;with bed alarm set  OT Visit Diagnosis: Muscle weakness (generalized) (M62.81)                Time: [id number] OT Time Calculation (min): 16 min Charges:  OT General Charges $OT Visit: 1 Visit OT Evaluation $OT Eval Low Complexity: 1 Low  [name], OTR/L Acute Rehabilitation Services 08/24/2019  [name] 08/24/2019, 1:21 PM

## 2019-08-24 NOTE — Progress Notes (Signed)
PROGRESS NOTE    [name]  [id number] DOB: 11/19/1950 DOA: 08/21/2019 PCP: Simona Huh, NP  Brief Narrative: 69 y.o.femalewith medical history significant forCAD,COPD on home 3-4L, Barrett's esophagus,gastroparesis due to hemigastrectomy,anxiety/depression, chronic narcotic use who presents with shortness of breath and decrease ambulation.  Patient says that she has been having lower extremity swelling up to her hips for months and has had progressive weakness to the point where today she can no longer walk.Has pain to bilateral thigh.Endorsed compliant with her diuretic. She also had increased shortness of breath both at rest and exertion. Normally on 3 to 4 L of oxygen at home. She is not able to tell me whether she has been having any orthopnea but she has to recline when she sleeps. Denies any chest pain. Patient otherwise is a poor historian and unable to recall much other history or symptoms.  Patient has been evaluated in the ED several times in the last 2 months for similar symptoms in last had a negative lower extremity ultrasound on 08/03/2019. She also saw vascular surgery on 08/04/2019 and it was noted that she had evidence of leg swelling symptoms dating back to 2009. 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  In the ED, she was afebrile with borderline blood pressure of around 102 over 80s and was on her normal home 3 L via nasal cannula. Lab work showed mild leukocytosis of 12.6. Sodium of 130, potassium of 2.5, glucose of 118, creatinine elevated at 2.10 from a prior baseline of 0.78. AST elevated at 97 and ALT at 60, alkaline phosphatase of 208. BNP of 174. Troponin elevated at 147.  Chest x-ray shows small bilateral pleural effusions and bibasilar atelectasis versus infiltrate  Assessment & Plan:   Principal Problem:   Weakness Active  Problems:   Anxiety state   COPD (chronic obstructive pulmonary disease) (HCC)   AKI (acute kidney injury) (HCC)   Hypokalemia   Hyponatremia   Transaminitis   Edema of lower extremity   PAD (peripheral artery disease) (HCC)   Elevated troponin   Failure to thrive in adult   #1 anasarca with bilateral lower extremity edema-more than likely secondary to severe hypoalbuminemia albumin 1.4 Seen by dietary follow the recommendations noted. She does have chronic bilateral lower extremity edema for which she takes diuretics at home.  She also has peripheral vascular disease followed by vascular surgery. Echo this admission ejection fraction 50 to 55%.  No regional wall motion abnormalities.  No evidence of cardiac tamponade.  #2 severe deconditioning with failure to thrive patient seen by physical therapy recommends SNF.  #3 very mildly elevated troponin with downward trend with no acute EKG changes   #4 O2 dependent COPD patient on 3 to 4 L of oxygen at home she also continues to smoke.  #5 history of depression anxiety and insomnia on BuSpar and as needed Ambien.  #6  Hyponatremia -avoid hypotonic fluids sodium 128 check urine and serum osmolality and follow-up levels in a.m.  #7 colitis by CT scan but patient asymptomatic no nausea vomiting diarrhea reported.  She has darkish discoloration on her abdominal wall from hot water bag which is chronic.  #8 chronic pain/chronic left lower extremity pain needs PT and SNF.  Continue pain medications as prior.  #9 fatty liver CT of the abdomen shows severe anasarca small ascites and bilateral pleural effusions with fatty infiltration of the liver.  Findings of colitis also noted but patient asymptomatic. Alkaline phosphatase AST and ALT trending down.  #10 leukocytosis resolving unclear reason no evidence of infection so far.  #11 hyperkalemia from over replacement DC potassium.  Lokelma ordered follow-up levels in a.m.  #12  acute urinary retention Foley placed UA negative urine culture pending  Nutrition Problem: Inadequate oral intake Etiology: acute illness, poor appetite     Signs/Symptoms: per patient/family report, meal completion < 25%    Interventions: Ensure Enlive (each supplement provides 350kcal and 20 grams of protein), Boost Breeze, Magic cup, MVI  Estimated body mass index is 15.17 kg/m as calculated from the following:   Height as of this encounter: 5' 3.5" (1.613 m).   Weight as of this encounter: 39.5 kg.  DVT prophylaxis: Lovenox Code Status: Full code  family Communication: None  disposition Plan: Patient admitted from home, PT recommends SNF patient is very deconditioned and failure to thrive.  Barriers to discharge passar pending await SNF bed, will need to check SNF Covid prior to discharge. Consultants:   None  Procedures: None Antimicrobials: None  Subjective:  Patient resting in bed no specific complaints today no nausea vomiting diarrhea reported. Objective: Vitals:   08/23/19 2036 08/24/19 0506 08/24/19 1243 08/24/19 1343  BP:  101/61 94/69   Pulse:  92 98   Resp:   20   Temp:  98.6 F (37 C) 99.3 F (37.4 C)   TempSrc:  Oral Oral   SpO2: 96% (!) 72% 100% 99%  Weight:      Height:        Intake/Output Summary (Last 24 hours) at 08/24/2019 1402 Last data filed at 08/24/2019 1130 Gross per 24 hour  Intake 660 ml  Output 1650 ml  Net -990 ml   Filed Weights   08/21/19 2134  Weight: 39.5 kg    Examination:  General exam: Appears calm and comfortable  Respiratory system: diminished breath sounds  to auscultation. Respiratory effort normal. Cardiovascular system: S1 & S2 heard, RRR. No JVD, murmurs, rubs, gallops or clicks. No pedal edema. Gastrointestinal system: Abdomen is nondistended, soft and nontender. No organomegaly or masses felt. Normal bowel sounds heard. Central nervous system: Alert and oriented. No focal neurological  deficits. Extremities:2 plus edema b/l up to lower abdominal wall Skin: No rashes, lesions or ulcers Psychiatry: Judgement and insight appear normal. Mood & affect appropriate.     Data Reviewed: I have personally reviewed following labs and imaging studies  CBC: Recent Labs  Lab 08/21/19 2132 08/22/19 0605 08/23/19 0152 08/24/19 0414  WBC 12.6* 17.1* 16.9* 11.9*  NEUTROABS 11.1*  --   --   --   HGB 14.1 13.4 13.0 10.5*  HCT 41.4 40.3 38.5 31.7*  MCV 95.0 97.1 95.5 100.0  PLT 273 244 116* 0000000*   Basic Metabolic Panel: Recent Labs  Lab 08/21/19 2132 08/22/19 0605 08/23/19 0518 08/24/19 0414  NA 130* 131* 130* 128*  K 2.5* 3.4* 5.3* 5.5*  CL 90* 93* 93* 88*  CO2 25 24 28  32  GLUCOSE 118* 80 94 76  BUN 50* 47* 45* 31*  CREATININE 2.10* 1.90* 1.52* 0.94  CALCIUM 7.3* 7.3* 7.4* 7.5*  MG 2.1  --   --   --    GFR: Estimated Creatinine Clearance: 36.2 mL/min (by C-G formula based on SCr of 0.94 mg/dL). Liver Function Tests: Recent Labs  Lab 08/21/19 2132 08/22/19 0605 08/23/19 0518 08/24/19 0414  AST 97* 97* 82* 74*  ALT 60* 61* 59*  56*  ALKPHOS 208* 213* 202* 171*  BILITOT 0.9 0.9 0.5 0.7  PROT 4.1* 4.1* 4.0* 3.8*  ALBUMIN 1.7* 1.7* 1.6* 1.4*   No results for input(s): LIPASE, AMYLASE in the last 168 hours. No results for input(s): AMMONIA in the last 168 hours. Coagulation Profile: No results for input(s): INR, PROTIME in the last 168 hours. Cardiac Enzymes: No results for input(s): CKTOTAL, CKMB, CKMBINDEX, TROPONINI in the last 168 hours. BNP (last 3 results) No results for input(s): PROBNP in the last 8760 hours. HbA1C: No results for input(s): HGBA1C in the last 72 hours. CBG: No results for input(s): GLUCAP in the last 168 hours. Lipid Profile: No results for input(s): CHOL, HDL, LDLCALC, TRIG, CHOLHDL, LDLDIRECT in the last 72 hours. Thyroid Function Tests: No results for input(s): TSH, T4TOTAL, FREET4, T3FREE, THYROIDAB in the last 72  hours. Anemia Panel: No results for input(s): VITAMINB12, FOLATE, FERRITIN, TIBC, IRON, RETICCTPCT in the last 72 hours. Sepsis Labs: No results for input(s): PROCALCITON, LATICACIDVEN in the last 168 hours.  Recent Results (from the past 240 hour(s))  SARS CORONAVIRUS 2 (TAT 6-24 HRS) Nasopharyngeal Nasopharyngeal Swab     Status: None   Collection Time: 08/21/19 10:51 PM   Specimen: Nasopharyngeal Swab  Result Value Ref Range Status   SARS Coronavirus 2 NEGATIVE NEGATIVE Final    Comment: (NOTE) SARS-CoV-2 target nucleic acids are NOT DETECTED. The SARS-CoV-2 RNA is generally detectable in upper and lower respiratory specimens during the acute phase of infection. Negative results do not preclude SARS-CoV-2 infection, do not rule out co-infections with other pathogens, and should not be used as the sole basis for treatment or other patient management decisions. Negative results must be combined with clinical observations, patient history, and epidemiological information. The expected result is Negative. Fact Sheet for Patients: SugarRoll.be Fact Sheet for Healthcare Providers: https://www.woods-.com/ This test is not yet approved or cleared by the [place] FDA and  has been authorized for detection and/or diagnosis of SARS-CoV-2 by FDA under an Emergency Use Authorization (EUA). This EUA will remain  in effect (meaning this test can be used) for the duration of the COVID-19 declaration under Section 56 4(b)(1) of the Act, 21 U.S.C. section 360bbb-3(b)(1), unless the authorization is terminated or revoked sooner. Performed at [facility], [place] [address], [place], [place] [address]   Culture, blood (routine x 2)     Status: None (Preliminary result)   Collection Time: 08/23/19  2:55 PM   Specimen: BLOOD RIGHT HAND  Result Value Ref Range Status   Specimen Description   Final    BLOOD RIGHT HAND Performed at [facility] [address], [place], [place] [address]    Special Requests   Final    BOTTLES DRAWN AEROBIC ONLY Blood Culture results may not be optimal due to an inadequate volume of blood received in culture bottles Performed at [facility] [address] [place], [place] [address]    Culture   Final    NO GROWTH < 24 HOURS Performed at [facility] [address] [place], [place] [address]    Report Status PENDING  Incomplete  Culture, blood (routine x 2)     Status: None (Preliminary result)   Collection Time: 08/23/19  3:02 PM   Specimen: BLOOD LEFT HAND  Result Value Ref Range Status   Specimen Description   Final    BLOOD LEFT HAND Performed at [facility]  [address] [place], [place] [address]    Special Requests   Final    BOTTLES DRAWN AEROBIC ONLY Blood Culture results may not be optimal due to an inadequate volume of blood received in culture bottles Performed at [facility] [address] [place], [place] [address]    Culture   Final    NO GROWTH < 24 HOURS Performed at [facility] [address], [place], [place] [address]    Report Status PENDING  Incomplete         Radiology Studies: CT ABDOMEN PELVIS WO CONTRAST  Result Date: 08/22/2019 CLINICAL DATA:  69 year old female with diffuse abdominal pain and distention. EXAM: CT ABDOMEN AND PELVIS WITHOUT CONTRAST TECHNIQUE: Multidetector CT imaging of the abdomen and pelvis was performed following the standard protocol without IV contrast. COMPARISON:  CT of the abdomen pelvis dated 06/23/2014. FINDINGS: Evaluation of this exam is limited in the absence of intravenous contrast. Lower chest: Small right and moderate left pleural effusions with associated partial compressive atelectasis of the lower lobes. Coronary vascular calcification and partially visualized pericardial effusion measuring 6 mm in thickness. There is  no intra-abdominal free air. There is a small ascites. Hepatobiliary: Advanced fatty infiltration of the liver. No intrahepatic biliary ductal dilatation. Cholecystectomy. Pancreas: Mildly atrophic pancreas. Spleen: Normal in size without focal abnormality. Adrenals/Urinary Tract: The adrenal glands are unremarkable. There is a 5 mm nonobstructing left renal upper pole calculus. There is a 2.5 cm left renal upper pole cyst. There is no hydronephrosis on either side. The right kidney is unremarkable. The urinary bladder is grossly unremarkable. Stomach/Bowel: Postsurgical changes of gastric bypass. There is diffuse thickening and edema of the: Which may be related to ascites and anasarca but concerning for colitis. Clinical correlation is recommended. There is no bowel obstruction. The appendix is not visualized with certainty. Vascular/Lymphatic: Advanced aortoiliac atherosclerotic disease. The IVC is unremarkable. No portal venous gas. There is no adenopathy. There is diffuse mesenteric edema. Reproductive: The uterus is suboptimally visualized. Other: Severe diffuse subcutaneous edema and anasarca. Musculoskeletal: Degenerative changes of the spine. Bilateral L5 pars defects with grade 1 L5-S1 anterolisthesis. Osteopenia. Age indeterminate fracture of the superior endplate of 624THL, new since the prior CT of 2015. Correlation with clinical exam and point tenderness recommended. IMPRESSION: 1. Findings concerning for colitis. Clinical correlation is recommended. No bowel obstruction. 2. Severe anasarca, small ascites, and bilateral pleural effusions. 3. Advanced fatty infiltration of the liver. 4. A 5 mm nonobstructing left renal upper pole calculus. No hydronephrosis. 5. Age indeterminate fracture of the superior endplate of 624THL, new since 2015. Correlation with clinical exam and point tenderness recommended. 6. Aortic Atherosclerosis (ICD10-I70.0). Electronically Signed   By: Anner Crete M.D.   On: 08/22/2019  18:20        Scheduled Meds: . Chlorhexidine Gluconate Cloth  6 each Topical Daily  . enoxaparin (LOVENOX) injection  30 mg Subcutaneous QHS  . feeding supplement  1 Container Oral Q24H  . feeding supplement (ENSURE ENLIVE)  237 mL Oral BID BM  . fluconazole  100 mg Oral Daily  . gabapentin  200 mg Oral TID  . mouth rinse  15 mL Mouth Rinse BID  . metoCLOPramide  5 mg Oral TID AC & HS  . multivitamin with minerals  1 tablet Oral Daily  . nicotine  21 mg Transdermal Daily  . nystatin  5 mL Oral QID  . sodium zirconium cyclosilicate  10 g Oral BID   Continuous  Infusions:   LOS: 2 days   Georgette Shell, MD  08/24/2019, 2:02 PM

## 2019-08-25 DIAGNOSIS — E8770 Fluid overload, unspecified: Secondary | ICD-10-CM

## 2019-08-25 LAB — CBC
HCT: 33 % — ABNORMAL LOW (ref 36.0–46.0)
Hemoglobin: 10.7 g/dL — ABNORMAL LOW (ref 12.0–15.0)
MCH: 32.7 pg (ref 26.0–34.0)
MCHC: 32.4 g/dL (ref 30.0–36.0)
MCV: 100.9 fL — ABNORMAL HIGH (ref 80.0–100.0)
Platelets: 182 10*3/uL (ref 150–400)
RBC: 3.27 MIL/uL — ABNORMAL LOW (ref 3.87–5.11)
RDW: 15.1 % (ref 11.5–15.5)
WBC: 9.2 10*3/uL (ref 4.0–10.5)
nRBC: 0 % (ref 0.0–0.2)

## 2019-08-25 LAB — COMPREHENSIVE METABOLIC PANEL
ALT: 53 U/L — ABNORMAL HIGH (ref 0–44)
AST: 53 U/L — ABNORMAL HIGH (ref 15–41)
Albumin: 1.4 g/dL — ABNORMAL LOW (ref 3.5–5.0)
Alkaline Phosphatase: 172 U/L — ABNORMAL HIGH (ref 38–126)
Anion gap: 8 (ref 5–15)
BUN: 22 mg/dL (ref 8–23)
CO2: 27 mmol/L (ref 22–32)
Calcium: 7.2 mg/dL — ABNORMAL LOW (ref 8.9–10.3)
Chloride: 94 mmol/L — ABNORMAL LOW (ref 98–111)
Creatinine, Ser: 0.59 mg/dL (ref 0.44–1.00)
GFR calc Af Amer: >60 mL/min (ref >60)
GFR calc non Af Amer: >60 mL/min (ref >60)
Glucose, Bld: 84 mg/dL (ref 70–99)
Potassium: 3.7 mmol/L (ref 3.5–5.1)
Sodium: 129 mmol/L — ABNORMAL LOW (ref 135–145)
Total Bilirubin: 0.7 mg/dL (ref 0.3–1.2)
Total Protein: 3.8 g/dL — ABNORMAL LOW (ref 6.5–8.1)

## 2019-08-25 LAB — SARS CORONAVIRUS 2 (TAT 6-24 HRS): SARS Coronavirus 2: NEGATIVE

## 2019-08-25 LAB — OSMOLALITY: Osmolality: 273 mOsm/kg — ABNORMAL LOW (ref 275–295)

## 2019-08-25 MED ORDER — BUMETANIDE 0.5 MG PO TABS
0.5000 mg | ORAL_TABLET | Freq: Two times a day (BID) | ORAL | Status: DC
  Administered 2019-08-25 – 2019-08-26 (×2): 0.5 mg via ORAL
  Filled 2019-08-25 (×3): qty 1

## 2019-08-25 MED ORDER — BUMETANIDE 0.5 MG PO TABS
0.5000 mg | ORAL_TABLET | Freq: Every day | ORAL | Status: DC
  Administered 2019-08-25: 0.5 mg via ORAL
  Filled 2019-08-25: qty 1

## 2019-08-25 MED ORDER — BUMETANIDE 0.5 MG PO TABS
0.5000 mg | ORAL_TABLET | Freq: Once | ORAL | Status: DC
  Filled 2019-08-25: qty 1

## 2019-08-25 MED ORDER — LORAZEPAM 2 MG/ML IJ SOLN
0.5000 mg | Freq: Once | INTRAMUSCULAR | Status: AC
  Administered 2019-08-25: 0.5 mg via INTRAVENOUS
  Filled 2019-08-25: qty 1

## 2019-08-25 MED ORDER — LIP MEDEX EX OINT
TOPICAL_OINTMENT | CUTANEOUS | Status: AC
  Filled 2019-08-25: qty 7

## 2019-08-25 MED ORDER — BUMETANIDE 1 MG PO TABS: 1.0000 mg | ORAL_TABLET | Freq: Every day | ORAL | Status: DC

## 2019-08-25 NOTE — TOC Progression Note (Signed)
Transition of Care [facility]) - Progression Note    Patient Details  Name: [name] MRN: [id number] Date of Birth: 1950-11-01  Transition of Care [facility]) CM/SW Contact  Jamontae Thwaites, Juliann Pulse, RN Phone Number: 08/25/2019, 1:29 PM  Clinical Narrative:  1.5 mi [place] and [facility] [address] [place], [place] [address] [phone] Overall rating Much above average 2. 1.5 mi [place] at [place], [place] [address] [phone] Overall rating Below average 3. 2.2 mi [place], [place] [address] [phone] Overall rating Average 4. 2.4 mi [place] at [place], [place] [address] [phone] Overall rating Much below average 5. 2.7 mi [facility] [place], [place] [address] [phone] Overall rating Below average 6. 2.9 mi [facility] and [facility] [address] [place], [place] [address] [phone] Overall rating Much below average 7. 3.1 [place], [place] [address] [phone] Overall rating Much above average 8. 3.6 [place], [place] [address] [phone] Overall rating Much below average 9. 3.7 mi [facility] [address] [place], [place] [address] [phone] Overall rating Below average 10. 3.8 mi [facility] [place], [place] [address] [phone] Overall rating Below average 11. 4.2 mi [facility] at [place], [place] [address] [phone] Overall rating Much above average 12. 4.7 mi [facility] [address] [place], [place] [address] [phone] Overall  rating Much above average 13. 5.5 mi [facility] [address] [place], [place] [address] [phone] Overall rating Average 14. [place], [place] [address] [phone] Overall rating Above average 15. 8.8 mi [facility] and [place],  [address] [phone] Overall rating Much below average <Previous      Expected Discharge Plan: [place] Barriers to Discharge: Insurance Authorization  Expected Discharge Plan and Services Expected Discharge Plan: [place]                                               Social Determinants of Health (SDOH) Interventions    Readmission Risk Interventions No flowsheet data found.

## 2019-08-25 NOTE — Progress Notes (Signed)
Physical Therapy Treatment Patient Details Name: [name] MRN: [id number] DOB: [birth year] Today's Date: 08/25/2019    History of Present Illness Pt admitted with SOB, weakness and increased LE edema and dx with bil pleural effusion and anasarca with bilateral lower extremity edema-more than likely secondary to severe hypoalbuminemia .  Pt with hx of COPD, macular degeneration, Gastric outlet syndrome, L hip fx (10), R hip fx (15), osteoporosis, CAD and chronic LBP    PT Comments    Pt agreeable to ambulate however does report bil LE pain and edema limiting movement.  Pt requiring mod assist for bed mobility and transfers and min assist for ambulation (recliner following for safety).  Pt reports feeling better after mobility and repositioned to comfort in bed with LEs elevated on pillow end of session.  Plan per chart is for d/c to SNF likely tomorrow.     Follow Up Recommendations  SNF     Equipment Recommendations  None recommended by PT    Recommendations for Other Services       Precautions / Restrictions Precautions Precautions: Fall Precaution Comments: chronic 3L O2 Restrictions Weight Bearing Restrictions: No    Mobility  Bed Mobility Overal bed mobility: Needs Assistance Bed Mobility: Supine to Sit;Sit to Supine     Supine to sit: Mod assist Sit to supine: Mod assist   General bed mobility comments: assist for LEs due to edema and pain; pt utilized bed rail, cues for technique  Transfers Overall transfer level: Needs assistance Equipment used: Rolling walker (2 wheeled) Transfers: Sit to/from Stand Sit to Stand: Mod assist;+2 safety/equipment         General transfer comment: verbal cues for hand placement, assist to rise and steady, cues for using RW for UE weight bearing to assist with self steadying, required assist for controling descent upon return to sitting as pt did not utilize UEs to self assist  Ambulation/Gait Ambulation/Gait assistance:  Min assist;+2 safety/equipment Gait Distance (Feet): 46 Feet Assistive device: Rolling walker (2 wheeled) Gait Pattern/deviations: Step-through pattern;Decreased stride length;Trunk flexed;Wide base of support     General Gait Details: small short steps, cues for posture, recliner following for safety; ambulated on 4L O2 [place], pt with poor waveform and cold hands so SpO2 77-99% during session, HR at rest 100 bpm, with sitting 113 bpm and up to 130 bpm with ambulating   Stairs             Wheelchair Mobility    Modified Rankin (Stroke Patients Only)       Balance Overall balance assessment: Needs assistance         Standing balance support: Bilateral upper extremity supported Standing balance-Leahy Scale: Poor Standing balance comment: reliant on UE and external support with initial standing                            Cognition Arousal/Alertness: Awake/alert Behavior During Therapy: WFL for tasks assessed/performed Overall Cognitive Status: Within Functional Limits for tasks assessed                                 General Comments: pt HOH      Exercises      General Comments        Pertinent Vitals/Pain Pain Assessment: 0-10 Pain Score: 6  Pain Location: Bil LEs with movement (L >R) Pain Descriptors / Indicators: Grimacing;Guarding;Sore;Tightness Pain Intervention(s):  Repositioned;Monitored during session;Patient requesting pain meds-RN notified(elevated LEs, pitting edema present)    Home Living                      Prior Function            PT Goals (current goals can now be found in the care plan section) Progress towards PT goals: Progressing toward goals    Frequency    Min 2X/week      PT Plan Current plan remains appropriate    Co-evaluation              AM-PAC PT "6 Clicks" Mobility   Outcome Measure  Help needed turning from your back to your side while in a flat bed without using  bedrails?: A Little Help needed moving from lying on your back to sitting on the side of a flat bed without using bedrails?: A Lot Help needed moving to and from a bed to a chair (including a wheelchair)?: A Lot Help needed standing up from a chair using your arms (e.g., wheelchair or bedside chair)?: A Lot Help needed to walk in hospital room?: A Lot Help needed climbing 3-5 steps with a railing? : Total 6 Click Score: 12    End of Session Equipment Utilized During Treatment: Gait belt;Oxygen Activity Tolerance: Patient limited by fatigue;Patient limited by pain Patient left: with call bell/phone within reach;in bed;with bed alarm set Nurse Communication: Mobility status;Patient requests pain meds PT Visit Diagnosis: Muscle weakness (generalized) (M62.81);Difficulty in walking, not elsewhere classified (R26.2)     Time: 1413-1430 PT Time Calculation (min) (ACUTE ONLY): 17 min  Charges:  $Gait Training: 8-22 mins                    Arlyce Dice, DPT Acute Rehabilitation Services Office: [phone]  Trena Platt 08/25/2019, 3:28 PM

## 2019-08-25 NOTE — TOC Progression Note (Signed)
Transition of Care [facility]) - Progression Note    Patient Details  Name: [name] MRN: [id number] Date of Birth: [birth year]  Transition of Care [facility]) CM/SW Contact  Randa Riss, Juliann Pulse, RN Phone Number: 08/25/2019, 1:10 PM  Clinical Narrative: Patient chose [facility] for SNF rep Deatra Ina bed,awaiting covid results. Christian [id number] w/confirmation H&P,PT/OT notes.     Expected Discharge Plan: Skilled Nursing Facility Barriers to Discharge: Insurance Authorization  Expected Discharge Plan and Services Expected Discharge Plan: [place]                                               Social Determinants of Health (SDOH) Interventions    Readmission Risk Interventions No flowsheet data found.

## 2019-08-25 NOTE — Progress Notes (Signed)
PROGRESS NOTE    [name]    Code Status: Full Code  [id number] DOB: Mar 10, 1951 DOA: 08/21/2019 LOS: 3 days  PCP: Simona Huh, NP CC:  Chief Complaint  Patient presents with   Leg Swelling       Hospital Summary   69 y.o.femalewith medical history significant forCAD,COPD on home 3-4L, Barrett's esophagus,gastroparesis due to hemigastrectomy,anxiety/depression, chronic narcotic use who presents with shortness of breath and decrease ambulation.  Patient says that she has been having lower extremity swelling up to her hips for months and has had progressive weakness to the point where today she can no longer walk.Has pain to bilateral thigh.Endorsed compliant with her diuretic. She also had increased shortness of breath both at rest and exertion. She has been seen the ED several times in the last 2 months for similar symptoms in last had a negative lower extremity ultrasound on 08/03/2019. She also saw vascular surgery on 08/04/2019 and it was noted that she had evidence of leg swelling symptoms dating back to 2009. 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  In the ED, she was afebrile with borderline blood pressure of around 102 over 80s and was on her normal home 3 L via nasal cannula. Lab work showed mild leukocytosis of 12.6. Sodium of 130, potassium of 2.5, glucose of 118, creatinine elevated at 2.10 from a prior baseline of 0.78. AST elevated at 97 and ALT at 60, alkaline phosphatase of 208. BNP of 174. Troponin elevated at 147.  Chest x-ray showed small bilateral pleural effusions and bibasilar atelectasis versus infiltrate  Edema thought to be from severe hypoalbuminemia that has been evaluated by dietary.  Due to her severe deconditioning with failure to thrive she has been seen by PT and plan is for SNF at discharge.  Currently awaiting Covid  testing.  A & P   Principal Problem:   Weakness Active Problems:   Anxiety state   COPD (chronic obstructive pulmonary disease) (HCC)   AKI (acute kidney injury) (HCC)   Hypokalemia   Hyponatremia   Transaminitis   Edema of lower extremity   PAD (peripheral artery disease) (HCC)   Elevated troponin   Failure to thrive in adult   1. Anasarca and bilateral lower extremity edema suspect secondary to severe hypoalbuminemia a. Continue with dietary recommendations b. Received 0.5 mg Bumex p.o. x1 this a.m., will give additional 0.5 mg dose this evening start 0.5 mg twice daily c. Compression stockings 2. Severe deconditioning with failure to thrive a. SNF at discharge pending COVID-19 testing 3. Mildly elevated troponin with downward trend a. No acute EKG changes b. Probably from demand ischemia 4. COPD with chronic hypoxia requiring 3 to 4 L nasal cannula at home a. Currently tolerating home O2 5. Hyponatremia, unknown etiology possibly from volume overload a. Check sodium studies b. Follow-up in a.m. 6. Depression, anxiety, insomnia a. Continue BuSpar and Ambien as needed 7. Colitis by CT scan a. Patient is asymptomatic b. will hold off on treatment 8. Chronic pain/chronic left lower extremity pain a. SNF at discharge b. Continue pain medications 9. Elevated LFTs likely secondary to hepatic steatosis a. No signs of obstruction b. Follow-up outpatient 10. hyperkalemia resolved 11. [place] ptosis resolved 12. Normocytic/borderline macrocytic anemia a. CBC with differential in a.m. 13. Acute urinary retention with Foley catheter placed a. Void trial with bladder scans and straight cath protocol  DVT prophylaxis:  Lovenox Family Communication: No family at bedside Disposition Plan:   Patient came from:   Home                                                                                          Anticipated d/c place: SNF  Barriers to d/c: COVID-19 testing pending,  likely discharge tomorrow pending SNF availability  Pressure injury documentation    None  Consultants  None  Procedures  None  Antibiotics   Anti-infectives (From admission, onward)   Start     Dose/Rate Route Frequency Ordered Stop   08/23/19 1800  fluconazole (DIFLUCAN) tablet 100 mg     100 mg Oral Daily 08/23/19 1706 08/25/19 0937        Subjective   Patient seen and examined at bedside in no acute distress and resting comfortably. No acute events overnight. Denies any acute complaints at this time. Tolerating diet well.   Objective   Vitals:   08/24/19 1343 08/24/19 2028 08/25/19 0512 08/25/19 1247  BP:  100/67 97/70 95/68   Pulse:  99 88 95  Resp:  20 18 16   Temp:  98.1 F (36.7 C) 97.7 F (36.5 C) 98.6 F (37 C)  TempSrc:  Oral Oral Oral  SpO2: 99% 100% 97% 98%  Weight:      Height:        Intake/Output Summary (Last 24 hours) at 08/25/2019 1649 Last data filed at 08/25/2019 1200 Gross per 24 hour  Intake 360 ml  Output 1300 ml  Net -940 ml   Filed Weights   08/21/19 2134  Weight: 39.5 kg    Examination:  Physical Exam Vitals and nursing note reviewed.  Constitutional:      Comments: Frail and cachectic  HENT:     Head: Normocephalic.     Mouth/Throat:     Mouth: Mucous membranes are moist.  Eyes:     Conjunctiva/sclera: Conjunctivae normal.  Cardiovascular:     Rate and Rhythm: Normal rate and regular rhythm.  Pulmonary:     Effort: Pulmonary effort is normal.     Breath sounds: Normal breath sounds.  Abdominal:     General: Abdomen is flat. There is no distension.  Musculoskeletal:        General: Swelling present.     Comments: 3+ bilateral lower extremity pitting edema Difficulty with lifting left lower extremity due to swelling/heaviness  Neurological:     Mental Status: She is alert. Mental status is at baseline.  Psychiatric:        Mood and Affect: Mood normal.     Data Reviewed: I have personally reviewed following labs  and imaging studies  CBC: Recent Labs  Lab 08/21/19 2132 08/22/19 0605 08/23/19 0152 08/24/19 0414 08/25/19 0449  WBC 12.6* 17.1* 16.9* 11.9* 9.2  NEUTROABS 11.1*  --   --   --   --   HGB 14.1 13.4 13.0 10.5* 10.7*  HCT 41.4 40.3 38.5 31.7* 33.0*  MCV 95.0 97.1 95.5 100.0 100.9*  PLT 273 244 116* 137* [id number]   Basic Metabolic Panel: Recent Labs  Lab 08/21/19 2132 08/22/19 [id number] 08/23/19 0518 08/24/19 0414  08/25/19 0449  NA 130* 131* 130* 128* 129*  K 2.5* 3.4* 5.3* 5.5* 3.7  CL 90* 93* 93* 88* 94*  CO2 25 24 28  32 27  GLUCOSE 118* 80 94 76 84  BUN 50* 47* 45* 31* 22  CREATININE 2.10* 1.90* 1.52* 0.94 0.59  CALCIUM 7.3* 7.3* 7.4* 7.5* 7.2*  MG 2.1  --   --   --   --    GFR: Estimated Creatinine Clearance: 42.6 mL/min (by C-G formula based on SCr of 0.59 mg/dL). Liver Function Tests: Recent Labs  Lab 08/21/19 2132 08/22/19 0605 08/23/19 0518 08/24/19 0414 08/25/19 0449  AST 97* 97* 82* 74* 53*  ALT 60* 61* 59* 56* 53*  ALKPHOS 208* 213* 202* 171* 172*  BILITOT 0.9 0.9 0.5 0.7 0.7  PROT 4.1* 4.1* 4.0* 3.8* 3.8*  ALBUMIN 1.7* 1.7* 1.6* 1.4* 1.4*   No results for input(s): LIPASE, AMYLASE in the last 168 hours. No results for input(s): AMMONIA in the last 168 hours. Coagulation Profile: No results for input(s): INR, PROTIME in the last 168 hours. Cardiac Enzymes: No results for input(s): CKTOTAL, CKMB, CKMBINDEX, TROPONINI in the last 168 hours. BNP (last 3 results) No results for input(s): PROBNP in the last 8760 hours. HbA1C: No results for input(s): HGBA1C in the last 72 hours. CBG: No results for input(s): GLUCAP in the last 168 hours. Lipid Profile: No results for input(s): CHOL, HDL, LDLCALC, TRIG, CHOLHDL, LDLDIRECT in the last 72 hours. Thyroid Function Tests: No results for input(s): TSH, T4TOTAL, FREET4, T3FREE, THYROIDAB in the last 72 hours. Anemia Panel: No results for input(s): VITAMINB12, FOLATE, FERRITIN, TIBC, IRON, RETICCTPCT in the last  72 hours. Sepsis Labs: No results for input(s): PROCALCITON, LATICACIDVEN in the last 168 hours.  Recent Results (from the past 240 hour(s))  SARS CORONAVIRUS 2 (TAT 6-24 HRS) Nasopharyngeal Nasopharyngeal Swab     Status: None   Collection Time: 08/21/19 10:51 PM   Specimen: Nasopharyngeal Swab  Result Value Ref Range Status   SARS Coronavirus 2 NEGATIVE NEGATIVE Final    Comment: (NOTE) SARS-CoV-2 target nucleic acids are NOT DETECTED. The SARS-CoV-2 RNA is generally detectable in upper and lower respiratory specimens during the acute phase of infection. Negative results do not preclude SARS-CoV-2 infection, do not rule out co-infections with other pathogens, and should not be used as the sole basis for treatment or other patient management decisions. Negative results must be combined with clinical observations, patient history, and epidemiological information. The expected result is Negative. Fact Sheet for Patients: SugarRoll.be Fact Sheet for Healthcare Providers: https://www.woods-mathews.com/ This test is not yet approved or cleared by the [place] FDA and  has been authorized for detection and/or diagnosis of SARS-CoV-2 by FDA under an Emergency Use Authorization (EUA). This EUA will remain  in effect (meaning this test can be used) for the duration of the COVID-19 declaration under Section 56 4(b)(1) of the Act, 21 U.S.C. section 360bbb-3(b)(1), unless the authorization is terminated or revoked sooner. Performed at [facility], [place] [address], [place], [place] [address]   Culture, blood (routine x 2)     Status: None (Preliminary result)   Collection Time: 08/23/19  2:55 PM   Specimen: BLOOD RIGHT HAND  Result Value Ref Range Status   Specimen Description   Final    BLOOD RIGHT HAND Performed at [place] [address] [place], [place] [address]    Special Requests   Final    BOTTLES DRAWN  AEROBIC ONLY Blood Culture results may not be optimal due to an inadequate volume of blood received in culture bottles Performed at [facility], [place] [address], [place], [place] [address]    Culture   Final    NO GROWTH 2 DAYS Performed at [place] [address], [place], [place] [address]    Report Status PENDING  Incomplete  Culture, blood (routine x 2)     Status: None (Preliminary result)   Collection Time: 08/23/19  3:02 PM   Specimen: BLOOD LEFT HAND  Result Value Ref Range Status   Specimen Description   Final    BLOOD LEFT HAND Performed at [place] [address], [place], [place] [address]    Special Requests   Final    BOTTLES DRAWN AEROBIC ONLY Blood Culture results may not be optimal due to an inadequate volume of blood received in culture bottles Performed at [place] [address] [place], [place] [address]    Culture   Final    NO GROWTH 2 DAYS Performed at [place] [address], [place], [place] [address]    Report Status PENDING  Incomplete         Radiology Studies: No results found.      Scheduled Meds:  bumetanide  0.5 mg Oral Daily   Chlorhexidine Gluconate Cloth  6 each Topical Daily   enoxaparin (LOVENOX) injection  30 mg Subcutaneous QHS   feeding supplement  1 Container Oral Q24H   feeding supplement (ENSURE ENLIVE)  237 mL Oral BID BM   gabapentin  200 mg Oral TID   mouth rinse  15 mL Mouth Rinse BID   metoCLOPramide  5 mg Oral TID AC & HS   multivitamin with minerals  1 tablet Oral Daily   nicotine  21 mg Transdermal Daily   nystatin  5 mL Oral QID   sodium zirconium cyclosilicate  10 g Oral BID   Continuous Infusions:   Time spent: 30 minutes with over 50% of the time coordinating the patient's care    Harold Hedge, DO [facility] Pager [phone]  Call night coverage person covering after 7pm
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
## 2019-08-25 NOTE — Care Management Important Message (Signed)
Important Message  Patient Details IM Letter given to Dessa Phi RN Case Manager to present to the Patient Name: [name] MRN: [id number] Date of Birth: 08-13-1950   Medicare Important Message Given:  Yes     Kerin Salen 08/25/2019, 11:14 AM

## 2019-08-26 LAB — CBC
HCT: 36.9 % (ref 36.0–46.0)
Hemoglobin: 11.9 g/dL — ABNORMAL LOW (ref 12.0–15.0)
MCH: 33.1 pg (ref 26.0–34.0)
MCHC: 32.2 g/dL (ref 30.0–36.0)
MCV: 102.5 fL — ABNORMAL HIGH (ref 80.0–100.0)
Platelets: 244 10*3/uL (ref 150–400)
RBC: 3.6 MIL/uL — ABNORMAL LOW (ref 3.87–5.11)
RDW: 14.9 % (ref 11.5–15.5)
WBC: 11.7 10*3/uL — ABNORMAL HIGH (ref 4.0–10.5)
nRBC: 0 % (ref 0.0–0.2)

## 2019-08-26 LAB — COMPREHENSIVE METABOLIC PANEL
ALT: 54 U/L — ABNORMAL HIGH (ref 0–44)
AST: 45 U/L — ABNORMAL HIGH (ref 15–41)
Albumin: 1.5 g/dL — ABNORMAL LOW (ref 3.5–5.0)
Alkaline Phosphatase: 186 U/L — ABNORMAL HIGH (ref 38–126)
Anion gap: 9 (ref 5–15)
BUN: 18 mg/dL (ref 8–23)
CO2: 32 mmol/L (ref 22–32)
Calcium: 7.4 mg/dL — ABNORMAL LOW (ref 8.9–10.3)
Chloride: 92 mmol/L — ABNORMAL LOW (ref 98–111)
Creatinine, Ser: 0.5 mg/dL (ref 0.44–1.00)
GFR calc Af Amer: >60 mL/min (ref >60)
GFR calc non Af Amer: >60 mL/min (ref >60)
Glucose, Bld: 88 mg/dL (ref 70–99)
Potassium: 3.3 mmol/L — ABNORMAL LOW (ref 3.5–5.1)
Sodium: 133 mmol/L — ABNORMAL LOW (ref 135–145)
Total Bilirubin: 0.6 mg/dL (ref 0.3–1.2)
Total Protein: 4.5 g/dL — ABNORMAL LOW (ref 6.5–8.1)

## 2019-08-26 MED ORDER — BUMETANIDE 0.5 MG PO TABS: 0.5000 mg | ORAL_TABLET | Freq: Two times a day (BID) | ORAL | 0 refills | Status: AC

## 2019-08-26 NOTE — Discharge Summary (Addendum)
Physician Discharge Summary  [name] [id number] DOB: 1951/02/26   PCP: Simona Huh, NP  Admit date: 08/21/2019 Discharge date: 08/26/2019 Length of Stay: 4 days   Code Status: Full Code  Admitted From:  Home Discharged to:  SNF  Discharge Condition: Improving  Recommendations for Outpatient Follow-up   1. Follow up with PCP in 1 week 2. Follow up BMP/CBC  3. Encourage PO intake 4. Bumex increased, monitor volume  Hospital Summary   69 y.o.femalewith medical history significant forCAD,COPD on home 3-4L, Barrett's esophagus,gastroparesis due to hemigastrectomy,anxiety/depression, chronic narcotic use who presents with shortness of breath and decrease ambulation.  Patient says that she has been having lower extremity swelling up to her hips for months and has had progressive weakness to the point where today she can no longer walk.Has pain to bilateral thigh.Endorsed compliant with her diuretic. She also had increased shortness of breath both at rest and exertion. She has been seen the ED several times in the last 2 months for similar symptoms in last had a negative lower extremity ultrasound on 08/03/2019. She also saw vascular surgery on 08/04/2019 and it was noted that she had evidence of leg swelling symptoms dating back to 2009. 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  In the ED, she was afebrile with borderline blood pressure of around 102 over 80s and was on her normal home 3 L via nasal cannula. Lab work showed mild leukocytosis of 12.6. Sodium of 130, potassium of 2.5, glucose of 118, creatinine elevated at 2.10 from a prior baseline of 0.78. AST elevated at 97 and ALT at 60, alkaline phosphatase of 208. BNP of 174. Troponin elevated at 147.  Chest x-ray showed small bilateral pleural effusions and bibasilar atelectasis versus  infiltrate  Edema thought to be from severe hypoalbuminemia that has been evaluated by dietary.  Due to her severe deconditioning with failure to thrive she has been seen by PT and plan is for SNF at discharge.   Bumex increased to 0.5 mg BID, compression stockings added.  A & P   Principal Problem:   Weakness Active Problems:   Anxiety state   COPD (chronic obstructive pulmonary disease) (HCC)   AKI (acute kidney injury) (HCC)   Hypokalemia   Hyponatremia   Transaminitis   Edema of lower extremity   PAD (peripheral artery disease) (HCC)   Elevated troponin   Failure to thrive in adult    1. Anasarca and bilateral lower extremity edema suspect secondary to severe hypoalbuminemia a. 2/9 lower extremity doppler negative for DVT, did show venous reflux on right common femoral vein and saphenofemoral junction b. LVEF 50-55% with grade I diastolic dysfunction c. Bumex increased to 0.5 mg BID, monitor volume status. Continue potassium supplement and follow up K d. Compression stockings 2. Severe deconditioning with failure to thrive a. SNF at discharge  3. Mildly elevated troponin with downward trend a. No acute EKG changes b. Probably from demand ischemia 4. COPD with chronic hypoxia requiring 3 to 4 L nasal cannula at home a. Currently tolerating home O2 regimen 5. Hyponatremia, unknown etiology probably from volume overload, improved with diuresis 1. Follow up outpatient 6. Depression, anxiety, insomnia a. Continue BuSpar and Ambien as needed 7. Colitis by CT scan a. Patient is asymptomatic b. will hold off on treatment at this time 8. Chronic pain/chronic left lower extremity pain a. SNF at discharge b. Continue  pain medications 9. Elevated LFTs likely secondary to hepatic steatosis a. No signs of obstruction b. Follow-up outpatient 10. hyperkalemia resolved 1. Hypokalemia at discharge, restarted home K supplement 11. Normocytic/borderline macrocytic  anemia a. Outpatient follow up 12. Acute urinary retention for unknown etiology with Foley catheter placed a. Tolerated void trial    Consultants  . none  Procedures  . Foley  Antibiotics   Anti-infectives (From admission, onward)   Start     Dose/Rate Route Frequency Ordered Stop   08/23/19 1800  fluconazole (DIFLUCAN) tablet 100 mg     100 mg Oral Daily 08/23/19 1706 08/25/19 0937       Subjective  Patient seen and examined at bedside no acute distress and resting comfortably.  No events overnight.  Tolerating diet. In good spirits and anticipating discharge to SNF.   Denies any chest pain, shortness of breath, fever, nausea, vomiting, urinary or bowel complaints. Otherwise ROS negative    Objective   Discharge Exam: Vitals:   08/25/19 1247 08/26/19 0429  BP: 95/68 117/84  Pulse: 95 91  Resp: 16 16  Temp: 98.6 F (37 C) 97.9 F (36.6 C)  SpO2: 98% 100%   Vitals:   08/24/19 2028 08/25/19 0512 08/25/19 1247 08/26/19 0429  BP: 100/67 97/70 95/68  117/84  Pulse: 99 88 95 91  Resp: 20 18 16 16   Temp: 98.1 F (36.7 C) 97.7 F (36.5 C) 98.6 F (37 C) 97.9 F (36.6 C)  TempSrc: Oral Oral Oral Oral  SpO2: 100% 97% 98% 100%  Weight:      Height:        Physical Exam Vitals and nursing note reviewed.  Constitutional:      Appearance: She is not ill-appearing or diaphoretic.  HENT:     Head: Normocephalic.     Mouth/Throat:     Mouth: Mucous membranes are moist.  Eyes:     Conjunctiva/sclera: Conjunctivae normal.  Cardiovascular:     Rate and Rhythm: Normal rate and regular rhythm.  Pulmonary:     Effort: Pulmonary effort is normal.     Breath sounds: Normal breath sounds.     Comments: 3L/min nasal canula Abdominal:     General: Abdomen is flat. There is no distension.  Musculoskeletal:     Comments: 2+ bilateral lower extremity edema  Neurological:     Mental Status: She is alert. Mental status is at baseline.  Psychiatric:        Mood and  Affect: Mood normal.        Behavior: Behavior normal.       The results of significant diagnostics from this hospitalization (including imaging, microbiology, ancillary and laboratory) are listed below for reference.     Microbiology: Recent Results (from the past 240 hour(s))  SARS CORONAVIRUS 2 (TAT 6-24 HRS) Nasopharyngeal Nasopharyngeal Swab     Status: None   Collection Time: 08/21/19 10:51 PM   Specimen: Nasopharyngeal Swab  Result Value Ref Range Status   SARS Coronavirus 2 NEGATIVE NEGATIVE Final    Comment: (NOTE) SARS-CoV-2 target nucleic acids are NOT DETECTED. The SARS-CoV-2 RNA is generally detectable in upper and lower respiratory specimens during the acute phase of infection. Negative results do not preclude SARS-CoV-2 infection, do not rule out co-infections with other pathogens, and should not be used as the sole basis for treatment or other patient management decisions. Negative results must be combined with clinical observations, patient history, and epidemiological information. The expected result is Negative. Fact Sheet for Patients:  SugarRoll.be Fact Sheet for Healthcare Providers: https://www.woods-mathews.com/ This test is not yet approved or cleared by the [place] FDA and  has been authorized for detection and/or diagnosis of SARS-CoV-2 by FDA under an Emergency Use Authorization (EUA). This EUA will remain  in effect (meaning this test can be used) for the duration of the COVID-19 declaration under Section 56 4(b)(1) of the Act, 21 U.S.C. section 360bbb-3(b)(1), unless the authorization is terminated or revoked sooner. Performed at [facility], [place] [address], [place], [place] [address]   Culture, blood (routine x 2)     Status: None (Preliminary result)   Collection Time: 08/23/19  2:55 PM   Specimen: BLOOD RIGHT HAND  Result Value Ref Range Status   Specimen Description   Final    BLOOD  RIGHT HAND Performed at [facility] [address], [place], [place] [address]    Special Requests   Final    BOTTLES DRAWN AEROBIC ONLY Blood Culture results may not be optimal due to an inadequate volume of blood received in culture bottles Performed at [facility] [address], [place], [place] [address]    Culture   Final    NO GROWTH 3 DAYS Performed at [facility], [place] [address], [place], [place] [address]    Report Status PENDING  Incomplete  Culture, blood (routine x 2)     Status: None (Preliminary result)   Collection Time: 08/23/19  3:02 PM   Specimen: BLOOD LEFT HAND  Result Value Ref Range Status   Specimen Description   Final    BLOOD LEFT HAND Performed at [facility] [address] [place], [place] [address]    Special Requests   Final    BOTTLES DRAWN AEROBIC ONLY Blood Culture results may not be optimal due to an inadequate volume of blood received in culture bottles Performed at [facility] [address] [place], [place] [address]    Culture   Final    NO GROWTH 3 DAYS Performed at [facility], [place] [address], [place], [place] [address]    Report Status PENDING  Incomplete  SARS CORONAVIRUS 2 (TAT 6-24 HRS) Nasopharyngeal Nasopharyngeal Swab     Status: None   Collection Time: 08/25/19  3:11 PM   Specimen: Nasopharyngeal Swab  Result Value Ref Range Status   SARS Coronavirus 2 NEGATIVE NEGATIVE Final    Comment: (NOTE) SARS-CoV-2 target nucleic acids are NOT DETECTED. The SARS-CoV-2 RNA is generally detectable in upper and lower respiratory specimens during the acute phase of infection. Negative results do not preclude SARS-CoV-2 infection, do not rule out co-infections with other pathogens, and should not be used as the sole basis for treatment or other patient management decisions. Negative results must be combined with clinical observations, patient  history, and epidemiological information. The expected result is Negative. Fact Sheet for Patients: SugarRoll.be Fact Sheet for Healthcare Providers: https://www.woods-mathews.com/ This test is not yet approved or cleared by the [place] FDA and  has been authorized for detection and/or diagnosis of SARS-CoV-2 by FDA under an Emergency Use Authorization (EUA). This EUA will remain  in effect (meaning this test can be used) for the duration of the COVID-19 declaration under Section 56 4(b)(1) of the Act, 21 U.S.C. section 360bbb-3(b)(1), unless the authorization is terminated or revoked sooner. Performed at [facility], [place] [address], [place],  [address]      Labs: BNP (  last 3 results) Recent Labs    07/25/19 1945 08/21/19 2132  BNP 87.7 [id number]*   Basic Metabolic Panel: Recent Labs  Lab 08/21/19 2132 08/21/19 2132 08/22/19 [id number] 08/23/19 0518 08/24/19 0414 08/25/19 0449 08/26/19 0504  NA 130*   < > 131* 130* 128* 129* 133*  K 2.5*   < > 3.4* 5.3* 5.5* 3.7 3.3*  CL 90*   < > 93* 93* 88* 94* 92*  CO2 25   < > 24 28 32 27 32  GLUCOSE 118*   < > 80 94 76 84 88  BUN 50*   < > 47* 45* 31* 22 18  CREATININE 2.10*   < > 1.90* 1.52* 0.94 0.59 0.50  CALCIUM 7.3*   < > 7.3* 7.4* 7.5* 7.2* 7.4*  MG 2.1  --   --   --   --   --   --    < > = values in this interval not displayed.   Liver Function Tests: Recent Labs  Lab 08/22/19 0605 08/23/19 0518 08/24/19 0414 08/25/19 0449 08/26/19 0504  AST 97* 82* 74* 53* 45*  ALT 61* 59* 56* 53* 54*  ALKPHOS 213* 202* 171* 172* 186*  BILITOT 0.9 0.5 0.7 0.7 0.6  PROT 4.1* 4.0* 3.8* 3.8* 4.5*  ALBUMIN 1.7* 1.6* 1.4* 1.4* 1.5*   No results for input(s): LIPASE, AMYLASE in the last 168 hours. No results for input(s): AMMONIA in the last 168 hours. CBC: Recent Labs  Lab 08/21/19 2132 08/21/19 2132 08/22/19 0605 08/23/19 0152 08/24/19 0414 08/25/19 0449 08/26/19 0504   WBC 12.6*   < > 17.1* 16.9* 11.9* 9.2 11.7*  NEUTROABS 11.1*  --   --   --   --   --   --   HGB 14.1   < > 13.4 13.0 10.5* 10.7* 11.9*  HCT 41.4   < > 40.3 38.5 31.7* 33.0* 36.9  MCV 95.0   < > 97.1 95.5 100.0 100.9* 102.5*  PLT 273   < > 244 116* 137* 182 244   < > = values in this interval not displayed.   Cardiac Enzymes: No results for input(s): CKTOTAL, CKMB, CKMBINDEX, TROPONINI in the last 168 hours. BNP: Invalid input(s): POCBNP CBG: No results for input(s): GLUCAP in the last 168 hours. D-Dimer No results for input(s): DDIMER in the last 72 hours. Hgb A1c No results for input(s): HGBA1C in the last 72 hours. Lipid Profile No results for input(s): CHOL, HDL, LDLCALC, TRIG, CHOLHDL, LDLDIRECT in the last 72 hours. Thyroid function studies No results for input(s): TSH, T4TOTAL, T3FREE, THYROIDAB in the last 72 hours.  Invalid input(s): FREET3 Anemia work up No results for input(s): VITAMINB12, FOLATE, FERRITIN, TIBC, IRON, RETICCTPCT in the last 72 hours. Urinalysis    Component Value Date/Time   COLORURINE YELLOW 08/21/2019 2254   APPEARANCEUR CLEAR 08/21/2019 2254   LABSPEC 1.010 08/21/2019 2254   PHURINE 5.0 08/21/2019 2254   GLUCOSEU NEGATIVE 08/21/2019 2254   HGBUR NEGATIVE 08/21/2019 2254   HGBUR negative 08/11/2009 1355   BILIRUBINUR NEGATIVE 08/21/2019 2254   BILIRUBINUR negative 12/18/2017 1643   BILIRUBINUR neg 01/25/2015 [place] 08/21/2019 2254   PROTEINUR NEGATIVE 08/21/2019 2254   UROBILINOGEN 0.2 12/18/2017 1643   UROBILINOGEN 0.2 04/15/2014 1401   NITRITE NEGATIVE 08/21/2019 2254   LEUKOCYTESUR NEGATIVE 08/21/2019 2254   Sepsis Labs Invalid input(s): PROCALCITONIN,  WBC,  LACTICIDVEN Microbiology Recent Results (from the past 240 hour(s))  SARS CORONAVIRUS 2 (TAT  6-24 HRS) Nasopharyngeal Nasopharyngeal Swab     Status: None   Collection Time: 08/21/19 10:51 PM   Specimen: Nasopharyngeal Swab  Result Value Ref Range Status    SARS Coronavirus 2 NEGATIVE NEGATIVE Final    Comment: (NOTE) SARS-CoV-2 target nucleic acids are NOT DETECTED. The SARS-CoV-2 RNA is generally detectable in upper and lower respiratory specimens during the acute phase of infection. Negative results do not preclude SARS-CoV-2 infection, do not rule out co-infections with other pathogens, and should not be used as the sole basis for treatment or other patient management decisions. Negative results must be combined with clinical observations, patient history, and epidemiological information. The expected result is Negative. Fact Sheet for Patients: SugarRoll.be Fact Sheet for Healthcare Providers: https://www.woods-mathews.com/ This test is not yet approved or cleared by the [place] FDA and  has been authorized for detection and/or diagnosis of SARS-CoV-2 by FDA under an Emergency Use Authorization (EUA). This EUA will remain  in effect (meaning this test can be used) for the duration of the COVID-19 declaration under Section 56 4(b)(1) of the Act, 21 U.S.C. section 360bbb-3(b)(1), unless the authorization is terminated or revoked sooner. Performed at [facility], [place] [address], [place], [place] [address]   Culture, blood (routine x 2)     Status: None (Preliminary result)   Collection Time: 08/23/19  2:55 PM   Specimen: BLOOD RIGHT HAND  Result Value Ref Range Status   Specimen Description   Final    BLOOD RIGHT HAND Performed at [facility] [address] [place], [place] [address]    Special Requests   Final    BOTTLES DRAWN AEROBIC ONLY Blood Culture results may not be optimal due to an inadequate volume of blood received in culture bottles Performed at [facility] [address] [place], [place] [address]    Culture   Final    NO GROWTH 3 DAYS Performed at [facility], [place] [address], [place], [place] [address]     Report Status PENDING  Incomplete  Culture, blood (routine x 2)     Status: None (Preliminary result)   Collection Time: 08/23/19  3:02 PM   Specimen: BLOOD LEFT HAND  Result Value Ref Range Status   Specimen Description   Final    BLOOD LEFT HAND Performed at [facility] [address] [place], [place] [address]    Special Requests   Final    BOTTLES DRAWN AEROBIC ONLY Blood Culture results may not be optimal due to an inadequate volume of blood received in culture bottles Performed at [facility] [address], [place], [place] [address]    Culture   Final    NO GROWTH 3 DAYS Performed at [facility], [place] [address], [place], [place] [address]    Report Status PENDING  Incomplete  SARS CORONAVIRUS 2 (TAT 6-24 HRS) Nasopharyngeal Nasopharyngeal Swab     Status: None   Collection Time: 08/25/19  3:11 PM   Specimen: Nasopharyngeal Swab  Result Value Ref Range Status   SARS Coronavirus 2 NEGATIVE NEGATIVE Final    Comment: (NOTE) SARS-CoV-2 target nucleic acids are NOT DETECTED. The SARS-CoV-2 RNA is generally detectable in upper and lower respiratory specimens during the acute phase of infection. Negative results do not preclude SARS-CoV-2 infection, do not rule out co-infections with other pathogens, and should not be used as the sole basis for treatment or other patient management decisions.  Negative results must be combined with clinical observations, patient history, and epidemiological information. The expected result is Negative. Fact Sheet for Patients: SugarRoll.be Fact Sheet for Healthcare Providers: https://www.woods-mathews.com/ This test is not yet approved or cleared by the [place] FDA and  has been authorized for detection and/or diagnosis of SARS-CoV-2 by FDA under an Emergency Use Authorization (EUA). This EUA will remain  in effect (meaning this test can be used)  for the duration of the COVID-19 declaration under Section 56 4(b)(1) of the Act, 21 U.S.C. section 360bbb-3(b)(1), unless the authorization is terminated or revoked sooner. Performed at [facility], [place] [address] [place], [place] [address]     Discharge Instructions     Discharge Instructions    Compression stockings   Complete by: As directed    Diet - low sodium heart healthy   Complete by: As directed    Discharge instructions   Complete by: As directed    You were seen and examined in the hospital for edema and cared for by a hospitalist.   Upon Discharge:  - increase your caloric intake and especially protein intake - take Bumex 0.5 mg twice daily (instead of once daily) - use compression stockings Make an appointment with your primary care physician within 7 days Get lab work prior to your follow up appointment with your PCP Bring all home medications to your appointment to review Request that your primary physician go over all hospital tests and procedures/radiological results at the follow up.   Please get all hospital records sent to your physician by signing a hospital release before you go home.   Read the complete instructions along with all the possible side effects for all the medicines you take and that have been prescribed to you. Take any new medicines after you have completely understood and accept all the possible adverse reactions/side effects.   If you have any questions about your discharge medications or the care you received while you were in the hospital, you can call the unit and asked to speak with the hospitalist on call. Once you are discharged, your primary care physician will handle any further medical issues. Please note that NO REFILLS for any discharge medications will be authorized, as it is imperative that you return to your primary care physician (or establish a relationship with a primary care physician if you do not have one) for  your aftercare needs so that they can reassess your need for medications and monitor your lab values.   Do not drive, operate heavy machinery, perform activities at heights, swimming or participation in water activities or provide baby sitting services if your were admitted for loss of consciousness/seizures or if you are on sedating medications including, but not limited to benzodiazepines, sleep medications, narcotic pain medications, etc., until you have been cleared to do so by a medical doctor.   Do not take more than prescribed medications.   Wear a seat belt while driving.  If you have smoked or chewed Tobacco in the last 2 years please stop smoking; also stop any regular Alcohol and/or any Recreational drug use including marijuana.  If you experience worsening of your admission symptoms or develop shortness of breath, chest pain, suicidal or homicidal thoughts or experience a life threatening emergency, you must seek medical attention immediately by calling 911 or calling your PCP immediately.   Increase activity slowly   Complete by: As directed      Allergies as of 08/26/2019  Reactions   Lithium Nausea And Vomiting   Celebrex [celecoxib] Other (See Comments)   History of bleeding ulcers   Quetiapine Other (See Comments)    tardive dyskinesia   Varenicline Tartrate Other (See Comments)   hallucinations   Morphine Itching      Medication List    TAKE these medications   acetaminophen 500 MG tablet Commonly known as: TYLENOL Take 1 tablet (500 mg total) by mouth every 6 (six) hours as needed.   albuterol (5 MG/ML) 0.5% nebulizer solution Commonly known as: PROVENTIL Take 1 mL (5 mg total) by nebulization every 6 (six) hours as needed for wheezing or shortness of breath.   ProAir HFA 108 (90 Base) MCG/ACT inhaler Generic drug: albuterol Inhale 1 puff into the lungs every 4 (four) hours as needed for wheezing or shortness of breath.   bumetanide 0.5 MG  tablet Commonly known as: BUMEX Take 1 tablet (0.5 mg total) by mouth 2 (two) times daily. What changed: when to take this   busPIRone 10 MG tablet Commonly known as: BUSPAR Take 10 mg by mouth daily.   cyclobenzaprine 10 MG tablet Commonly known as: FLEXERIL Take 10 mg by mouth 3 (three) times daily as needed.   etodolac 400 MG 24 hr tablet Commonly known as: LODINE XL Take 400 mg by mouth daily as needed.   gabapentin 100 MG capsule Commonly known as: NEURONTIN Take 200 mg by mouth 3 (three) times daily.   GOODYS BODY PAIN PO Take 1-2 packets by mouth every 8 (eight) hours as needed (pain).   metoCLOPramide 5 MG tablet Commonly known as: REGLAN Take 1 tablet (5 mg total) by mouth 4 (four) times daily -  before meals and at bedtime.   ondansetron 8 MG disintegrating tablet Commonly known as: ZOFRAN-ODT Take 8 mg by mouth 3 (three) times daily as needed.   oxyCODONE 5 MG immediate release tablet Commonly known as: Oxy IR/ROXICODONE Take 1 tablet (5 mg total) by mouth every 6 (six) hours as needed for severe pain.   oxyCODONE-acetaminophen 5-325 MG tablet Commonly known as: PERCOCET/ROXICET Take 1 tablet by mouth 5 (five) times daily as needed.   Potassium Chloride ER 20 MEQ Tbcr Take 20 mEq by mouth daily.   Vitamin D (Ergocalciferol) 1.25 MG (50000 UNIT) Caps capsule Commonly known as: DRISDOL Take 1 capsule (50,000 Units total) by mouth every 7 (seven) days.   XANAX PO Take 1 tablet by mouth at bedtime as needed (anxiety, sleep).   zolpidem 5 MG tablet Commonly known as: AMBIEN Take 5 mg by mouth at bedtime as needed for sleep.      Contact information for after-discharge care    Destination    [facility] .   Service: Skilled Nursing Contact information: [address] [place] [address] [phone]             Allergies  Allergen Reactions  . Lithium Nausea And Vomiting  . Celebrex [Celecoxib]  Other (See Comments)    History of bleeding ulcers  . Quetiapine Other (See Comments)     tardive dyskinesia  . Varenicline Tartrate Other (See Comments)    hallucinations  . Morphine Itching    Time coordinating discharge: Over 30 minutes   SIGNED:   Harold Hedge, D.O. [facility] Pager: [phone]  08/26/2019, 10:31 AM

## 2019-08-26 NOTE — Progress Notes (Signed)
RN attempted handoff report to [facility] but received voicemail.  RN left contact phone number for a return call.    RN will attempt to call again.
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
## 2019-08-26 NOTE — TOC Transition Note (Signed)
Transition of Care [facility]) - CM/SW Discharge Note   Patient Details  Name: [name] MRN: [id number] Date of Birth: August 14, 1950  Transition of Care [facility]) CM/SW Contact:  Dessa Phi, RN Phone Number: 08/26/2019, 11:31 AM   Clinical Narrative: Per patient request-APS on speaker phone in rm with patient/Roommate [name]. Patient has concerns about asking [name] to assist w/helping her @ home & she feels she just needs additional help. Patient does not feel threatened or harmed by [facility]. Referral for [facility] was recommended. No APS referral needed at this time. No further CM needs.      Final next level of care: Skilled Nursing Facility Barriers to Discharge: No Barriers Identified   Patient Goals and CMS Choice        Discharge Placement              Patient chooses bed at: Other - please specify in the comment section below:([place]) Patient to be transferred to facility by: [place] Name of family member notified: [name] [id number] Patient and family notified of of transfer: 08/25/19  Discharge Plan and Services                                     Social Determinants of Health (SDOH) Interventions     Readmission Risk Interventions No flowsheet data found.

## 2019-08-26 NOTE — TOC Progression Note (Signed)
Transition of Care [facility]) - Progression Note    Patient Details  Name: [name] MRN: [id number] Date of Birth: 1950/08/12  Transition of Care [facility]) CM/SW Contact  Kamran Coker, Juliann Pulse, RN Phone Number: 08/26/2019, 10:42 AM  Clinical Narrative: d/c summary sent to [facility] via Hub awaiting rm#, tel# for report.      Expected Discharge Plan: Skilled Nursing Facility Barriers to Discharge: No Barriers Identified  Expected Discharge Plan and Services Expected Discharge Plan: [place]         Expected Discharge Date: 08/26/19                                     Social Determinants of Health (SDOH) Interventions    Readmission Risk Interventions No flowsheet data found.

## 2019-08-26 NOTE — Plan of Care (Signed)
  Problem: Education: Goal: Knowledge of disease or condition will improve Outcome: Adequate for Discharge Goal: Knowledge of the prescribed therapeutic regimen will improve Outcome: Adequate for Discharge Goal: Individualized Educational Video(s) Outcome: Adequate for Discharge   Problem: Activity: Goal: Ability to tolerate increased activity will improve Outcome: Adequate for Discharge Goal: Will verbalize the importance of balancing activity with adequate rest periods Outcome: Adequate for Discharge   Problem: Respiratory: Goal: Ability to maintain a clear airway will improve Outcome: Adequate for Discharge Goal: Levels of oxygenation will improve Outcome: Adequate for Discharge Goal: Ability to maintain adequate ventilation will improve Outcome: Adequate for Discharge   Problem: Education: Goal: Knowledge of General Education information will improve Description: Including pain rating scale, medication(s)/side effects and non-pharmacologic comfort measures Outcome: Adequate for Discharge   Problem: Health Behavior/Discharge Planning: Goal: Ability to manage health-related needs will improve Outcome: Adequate for Discharge   Problem: Clinical Measurements: Goal: Ability to maintain clinical measurements within normal limits will improve Outcome: Adequate for Discharge Goal: Will remain free from infection Outcome: Adequate for Discharge Goal: Diagnostic test results will improve Outcome: Adequate for Discharge Goal: Respiratory complications will improve Outcome: Adequate for Discharge Goal: Cardiovascular complication will be avoided Outcome: Adequate for Discharge   Problem: Activity: Goal: Risk for activity intolerance will decrease Outcome: Adequate for Discharge   Problem: Nutrition: Goal: Adequate nutrition will be maintained Outcome: Adequate for Discharge   Problem: Nutrition: Goal: Adequate nutrition will be maintained Outcome: Adequate for Discharge   Problem: Activity: Goal: Risk for activity intolerance will decrease Outcome: Adequate for Discharge   Problem: Activity: Goal: Risk for activity intolerance will decrease Outcome: Adequate for Discharge   Problem: Elimination: Goal: Will not experience complications related to bowel motility Outcome: Adequate for Discharge Goal: Will not experience complications related to urinary retention Outcome: Adequate for Discharge   Problem: Coping: Goal: Level of anxiety will decrease Outcome: Adequate for Discharge   Problem: Pain Managment: Goal: General experience of comfort will improve Outcome: Adequate for Discharge   Problem: Safety: Goal: Ability to remain free from injury will improve Outcome: Adequate for Discharge   Problem: Skin Integrity: Goal: Risk for impaired skin integrity will decrease Outcome: Adequate for Discharge

## 2019-08-26 NOTE — Progress Notes (Signed)
Patient wanted RN to talk with caregiver, Jackelyn Poling, about her fears regarding going to her home where roommate continues to reside against Aristea's wishes.  RN spoke with Jackelyn Poling, who will be in communication with [place] to where patient was discharged today for rehabilitation.

## 2019-08-26 NOTE — TOC Transition Note (Signed)
Transition of Care [facility]) - CM/SW Discharge Note   Patient Details  Name: FELICITY BACCHI MRN: [id number] Date of Birth: 04-12-1951  Transition of Care [facility]) CM/SW Contact:  Dessa Phi, RN Phone Number: 08/26/2019, 10:27 AM   Clinical Narrative: Awaiting d/c summary to be completed then wil fax to [facility] for review,then await bed tel# for nsg to call report.      Final next level of care: Skilled Nursing Facility Barriers to Discharge: No Barriers Identified   Patient Goals and CMS Choice        Discharge Placement              Patient chooses bed at: Other - please specify in the comment section below:([place]) Patient to be transferred to facility by: [place] Name of family member notified: [name] [id number] Patient and family notified of of transfer: 08/25/19  Discharge Plan and Services                                     Social Determinants of Health (SDOH) Interventions     Readmission Risk Interventions No flowsheet data found.

## 2019-08-26 NOTE — TOC Transition Note (Signed)
Transition of Care [facility]) - CM/SW Discharge Note   Patient Details  Name: [name] MRN: [id number] Date of Birth: 1950-08-12  Transition of Care [facility]) CM/SW Contact:  Dessa Phi, RN Phone Number: 08/26/2019, 11:04 AM   Clinical Narrative:   PTAR called.No further CM needs.    Final next level of care: Skilled Nursing Facility Barriers to Discharge: No Barriers Identified   Patient Goals and CMS Choice        Discharge Placement              Patient chooses bed at: Other - please specify in the comment section below:([place]) Patient to be transferred to facility by: [place] Name of family member notified: [name] [id number] Patient and family notified of of transfer: 08/25/19  Discharge Plan and Services                                     Social Determinants of Health (SDOH) Interventions     Readmission Risk Interventions No flowsheet data found.

## 2019-08-28 LAB — CULTURE, BLOOD (ROUTINE X 2)
Culture: NO GROWTH
Culture: NO GROWTH

## 2019-09-07 ENCOUNTER — Ambulatory Visit: Payer: Medicare Other

## 2019-09-23 ENCOUNTER — Other Ambulatory Visit (HOSPITAL_COMMUNITY): Payer: Self-pay | Admitting: Nurse Practitioner

## 2019-09-23 ENCOUNTER — Other Ambulatory Visit: Payer: Self-pay | Admitting: Nurse Practitioner

## 2019-09-23 DIAGNOSIS — R970 Elevated carcinoembryonic antigen [CEA]: Secondary | ICD-10-CM

## 2019-10-01 ENCOUNTER — Ambulatory Visit (HOSPITAL_COMMUNITY): Payer: Medicare Other

## 2019-10-14 ENCOUNTER — Encounter (HOSPITAL_COMMUNITY): Payer: Self-pay

## 2019-10-14 ENCOUNTER — Ambulatory Visit (HOSPITAL_COMMUNITY)
Admission: RE | Admit: 2019-10-14 | Discharge: 2019-10-14 | Disposition: A | Discharge status: Home / Self Care | Payer: Medicare Other | Source: Ambulatory Visit | Attending: Nurse Practitioner | Admitting: Nurse Practitioner

## 2019-10-14 ENCOUNTER — Other Ambulatory Visit: Payer: Self-pay

## 2019-10-14 DIAGNOSIS — R970 Elevated carcinoembryonic antigen [CEA]: Secondary | ICD-10-CM

## 2019-10-14 NOTE — Progress Notes (Signed)
Arrived to [facility], pt left without having procedure. Cancel consult.

## 2019-10-18 ENCOUNTER — Emergency Department (HOSPITAL_COMMUNITY)
Admission: EM | Admit: 2019-10-18 | Discharge: 2019-10-19 | Disposition: A | Discharge status: Home / Self Care | Payer: Medicare Other | Attending: Emergency Medicine | Admitting: Emergency Medicine

## 2019-10-18 ENCOUNTER — Emergency Department (HOSPITAL_COMMUNITY): Payer: Medicare Other

## 2019-10-18 ENCOUNTER — Other Ambulatory Visit: Payer: Self-pay

## 2019-10-18 ENCOUNTER — Encounter (HOSPITAL_COMMUNITY): Payer: Self-pay

## 2019-10-18 DIAGNOSIS — R0902 Hypoxemia: Secondary | ICD-10-CM

## 2019-10-18 DIAGNOSIS — R600 Localized edema: Secondary | ICD-10-CM | POA: Diagnosis not present

## 2019-10-18 DIAGNOSIS — J441 Chronic obstructive pulmonary disease with (acute) exacerbation: Secondary | ICD-10-CM | POA: Insufficient documentation

## 2019-10-18 DIAGNOSIS — Z20822 Contact with and (suspected) exposure to covid-19: Secondary | ICD-10-CM | POA: Diagnosis not present

## 2019-10-18 DIAGNOSIS — F17210 Nicotine dependence, cigarettes, uncomplicated: Secondary | ICD-10-CM | POA: Diagnosis not present

## 2019-10-18 DIAGNOSIS — R443 Hallucinations, unspecified: Secondary | ICD-10-CM | POA: Diagnosis not present

## 2019-10-18 DIAGNOSIS — Z79899 Other long term (current) drug therapy: Secondary | ICD-10-CM | POA: Insufficient documentation

## 2019-10-18 LAB — CBC WITH DIFFERENTIAL/PLATELET
Abs Immature Granulocytes: 0.03 10*3/uL (ref 0.00–0.07)
Basophils Absolute: 0.1 10*3/uL (ref 0.0–0.1)
Basophils Relative: 1 %
Eosinophils Absolute: 0.4 10*3/uL (ref 0.0–0.5)
Eosinophils Relative: 4 %
HCT: 32.7 % — ABNORMAL LOW (ref 36.0–46.0)
Hemoglobin: 10.3 g/dL — ABNORMAL LOW (ref 12.0–15.0)
Immature Granulocytes: 0 %
Lymphocytes Relative: 13 %
Lymphs Abs: 1.6 10*3/uL (ref 0.7–4.0)
MCH: 31.5 pg (ref 26.0–34.0)
MCHC: 31.5 g/dL (ref 30.0–36.0)
MCV: 100 fL (ref 80.0–100.0)
Monocytes Absolute: 0.9 10*3/uL (ref 0.1–1.0)
Monocytes Relative: 8 %
Neutro Abs: 8.8 10*3/uL — ABNORMAL HIGH (ref 1.7–7.7)
Neutrophils Relative %: 74 %
Platelets: 384 10*3/uL (ref 150–400)
RBC: 3.27 MIL/uL — ABNORMAL LOW (ref 3.87–5.11)
RDW: 12 % (ref 11.5–15.5)
WBC: 11.9 10*3/uL — ABNORMAL HIGH (ref 4.0–10.5)
nRBC: 0 % (ref 0.0–0.2)

## 2019-10-18 LAB — COMPREHENSIVE METABOLIC PANEL
ALT: 23 U/L (ref 0–44)
AST: 24 U/L (ref 15–41)
Albumin: 2.7 g/dL — ABNORMAL LOW (ref 3.5–5.0)
Alkaline Phosphatase: 116 U/L (ref 38–126)
Anion gap: 8 (ref 5–15)
BUN: 14 mg/dL (ref 8–23)
CO2: 37 mmol/L — ABNORMAL HIGH (ref 22–32)
Calcium: 8.2 mg/dL — ABNORMAL LOW (ref 8.9–10.3)
Chloride: 93 mmol/L — ABNORMAL LOW (ref 98–111)
Creatinine, Ser: 0.57 mg/dL (ref 0.44–1.00)
GFR calc Af Amer: >60 mL/min (ref >60)
GFR calc non Af Amer: >60 mL/min (ref >60)
Glucose, Bld: 85 mg/dL (ref 70–99)
Potassium: 3.5 mmol/L (ref 3.5–5.1)
Sodium: 138 mmol/L (ref 135–145)
Total Bilirubin: 0.5 mg/dL (ref 0.3–1.2)
Total Protein: 6.1 g/dL — ABNORMAL LOW (ref 6.5–8.1)

## 2019-10-18 LAB — BLOOD GAS, VENOUS
Acid-Base Excess: 11.7 mmol/L — ABNORMAL HIGH (ref 0.0–2.0)
Bicarbonate: 38.9 mmol/L — ABNORMAL HIGH (ref 20.0–28.0)
O2 Saturation: 31.7 %
Patient temperature: 98.6
pCO2, Ven: 68.1 mmHg — ABNORMAL HIGH (ref 44.0–60.0)
pH, Ven: 7.375 (ref 7.250–7.430)
pO2, Ven: <31 mmHg — CL (ref 32.0–45.0)

## 2019-10-18 LAB — PROTIME-INR
INR: 0.9 (ref 0.8–1.2)
Prothrombin Time: 12.4 seconds (ref 11.4–15.2)

## 2019-10-18 LAB — I-STAT CHEM 8, ED
BUN: 12 mg/dL (ref 8–23)
Calcium, Ion: 1.09 mmol/L — ABNORMAL LOW (ref 1.15–1.40)
Chloride: 90 mmol/L — ABNORMAL LOW (ref 98–111)
Creatinine, Ser: 0.6 mg/dL (ref 0.44–1.00)
Glucose, Bld: 79 mg/dL (ref 70–99)
HCT: 33 % — ABNORMAL LOW (ref 36.0–46.0)
Hemoglobin: 11.2 g/dL — ABNORMAL LOW (ref 12.0–15.0)
Potassium: 3.5 mmol/L (ref 3.5–5.1)
Sodium: 136 mmol/L (ref 135–145)
TCO2: 38 mmol/L — ABNORMAL HIGH (ref 22–32)

## 2019-10-18 LAB — URINALYSIS, ROUTINE W REFLEX MICROSCOPIC
Bilirubin Urine: NEGATIVE
Glucose, UA: NEGATIVE mg/dL
Hgb urine dipstick: NEGATIVE
Ketones, ur: NEGATIVE mg/dL
Leukocytes,Ua: NEGATIVE
Nitrite: NEGATIVE
Protein, ur: NEGATIVE mg/dL
Specific Gravity, Urine: 1.015 (ref 1.005–1.030)
pH: 5 (ref 5.0–8.0)

## 2019-10-18 LAB — LACTIC ACID, PLASMA: Lactic Acid, Venous: 0.8 mmol/L (ref 0.5–1.9)

## 2019-10-18 LAB — BRAIN NATRIURETIC PEPTIDE: B Natriuretic Peptide: 128.6 pg/mL — ABNORMAL HIGH (ref 0.0–100.0)

## 2019-10-18 MED ORDER — SODIUM CHLORIDE (PF) 0.9 % IJ SOLN
INTRAMUSCULAR | Status: AC
  Filled 2019-10-18: qty 50

## 2019-10-18 MED ORDER — ALBUTEROL SULFATE HFA 108 (90 BASE) MCG/ACT IN AERS: 2.0000 | INHALATION_SPRAY | Freq: Once | RESPIRATORY_TRACT | Status: DC

## 2019-10-18 MED ORDER — ALBUTEROL SULFATE (2.5 MG/3ML) 0.083% IN NEBU
2.5000 mg | INHALATION_SOLUTION | Freq: Once | RESPIRATORY_TRACT | Status: AC
  Administered 2019-10-19: 2.5 mg via RESPIRATORY_TRACT
  Filled 2019-10-18: qty 3

## 2019-10-18 MED ORDER — IOHEXOL 350 MG/ML SOLN
100.0000 mL | Freq: Once | INTRAVENOUS | Status: AC | PRN
  Administered 2019-10-18: 100 mL via INTRAVENOUS

## 2019-10-18 MED ORDER — METHYLPREDNISOLONE SODIUM SUCC 125 MG IJ SOLR
125.0000 mg | Freq: Once | INTRAMUSCULAR | Status: AC
  Administered 2019-10-19: 125 mg via INTRAVENOUS
  Filled 2019-10-18: qty 2

## 2019-10-18 NOTE — ED Triage Notes (Signed)
Per ems: Pt coming from home with a complaint by caregiver of low oxygen saturation and new onset hallucinations/confusion. SpO2 was 74 on 3.5 L and was adjusted to 5L and went to 86%.

## 2019-10-18 NOTE — ED Provider Notes (Signed)
[facility] DEPT Provider Note   CSN: [id number] Arrival date & time: 10/18/19  2104     History Chief Complaint  Patient presents with  . low oxygen     [name] is a 69 y.o. female hx of COPD on 4 L nasal cannula at baseline, gastric outlet obstruction status post surgery, presenting with hypoxia, hallucinations.  Per the caregiver, patient is not very compliant wearing her oxygen. She apparently went out for a walk and did not wear her oxygen and came back hallucinating.  She was noted to be very confused.  The caregiver put a pulse ox on her and apparently her oxygen was 68% initially on room air.  Patient's oxygen went up to 74% on 3 L and 86% on 5 L.  Patient states that she feels much better now.   The history is provided by the patient.       Past Medical History:  Diagnosis Date  . Acute duodenal ulcer with hemorrhage and perforation, with obstruction ([place]) 06/26/2004   multiple pyloric perferations  . Allergy   . Anemia   . Anxiety    sees [name] NP at Dr. Radonna Ricker office  . Asthma    hx of   . Barrett's esophagus   . Chronic abdominal pain    narcotic dependence, dr gyarteng-dak at [facility]  . 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 Complication of anesthesia    pt states had too much xanax on board - agitated   . COPD (chronic obstructive pulmonary disease) ([place])   . Depression   . Exertional shortness of breath   . Fall 11/23/2014   frequent falls  . Fx of fibula 07-28-11   left fibula, 2 places   . Fx two ribs-open 08-24-11   left 4th and 5th  . GASTRIC OUTLET OBSTRUCTION 08/28/2007   In July 2008 she underwent laparoscopic enterolysis, Nissen fundoplication over a [id number] bougie, single pledgeted suture colosure of the hiatus and a 3 suture wrap.  Lap truncal vagotomy and a loop gastrojejunostomy was performed.  This was revised in August of 2009 to a roux en Y gastrojejujostomy.       . Gastroparesis 06/03/2007   Qualifier: Diagnosis of  By: [name] MD, [name]   . GERD (gastroesophageal reflux disease)   . History of blood transfusion    "related to OR; maybe when I perforated that ulcer"  . History of hiatal hernia   . Lap Nissen + truncal vagotomy July 2008 05/13/2013  . LEG EDEMA 01/19/2008   Qualifier: Diagnosis of  By: [name] MD, [name]   . Macular degeneration, bilateral    early onset, severe, legally blind and worsening. Followed by dr. Katy Fitch  . MENOPAUSE 01/07/2007   Qualifier: Diagnosis of  By: [name], CMA, [place]    . Narcotic abuse ([place])   . Osteoporosis    severe  . Oxygen deficiency   . Oxygen dependent    3L/[place]   . Peptic ulcer disease    dr Oletta Lamas  . Pneumonia, organism unspecified(486) 11/07/2010   Assoc with R Parapneumonic effusion 09/2010    - Tapped 10/05/10    - CxR resolved 11/07/2010    . S/P jejunostomy Feb 2016 07/26/2014  . Thyroid nodule 2013   benign after biopsy w/ Dr. Redmond Baseman    Patient Active Problem List   Diagnosis Date Noted  . Weakness 08/22/2019  . AKI (acute kidney injury) ([place]) 08/22/2019  .  Hypokalemia 08/22/2019  . Hyponatremia 08/22/2019  . Transaminitis 08/22/2019  . Edema of lower extremity 08/22/2019  . PAD (peripheral artery disease) ([place]) 08/22/2019  . Elevated troponin 08/22/2019  . Failure to thrive in adult 08/22/2019  . Abdominal pain, chronic, epigastric 05/31/2017  . Episodic polysubstance dependence ([place]) 05/31/2017  . At high risk for injury related to fall 05/18/2017  . Chronic nausea 05/18/2017  . Poor compliance with medication 11/20/2016  . Insomnia 11/20/2016  . Early onset macular degeneration 11/20/2016  . Legally blind 11/20/2016  . Abdominal pain   . Hypoxia 04/08/2015  . Opioid type dependence, continuous ([place])   . Protein-calorie malnutrition ([place])   . Episode of recurrent major depressive disorder ([place])   . Secondary hyperparathyroidism ([place]) 01/10/2015  . Vitamin D deficiency 01/10/2015   . 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 Anemia, iron deficiency 11/03/2014  . Chronic pain syndrome 10/24/2014  . Osteoporosis 10/05/2014  . Vertebral compression fracture ([place]) 10/05/2014  . Coronary artery disease due to calcified coronary lesion 10/05/2014  . Weight loss   . Tobacco abuse   . History of subtotal gastrectomy with Roux-en-Y recontruction   . Orthostatic hypotension 08/11/2014  . Memory loss, short term 07/18/2014  . Constipation, chronic 07/10/2013  . Chronic abdominal pain   . COPD (chronic obstructive pulmonary disease) ([place]) 11/07/2010  . Major depressive disorder, recurrent episode, moderate ([place]) 06/06/2010  . 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 Anxiety state 01/07/2007  . Barrett's esophagus 07/03/2005    Past Surgical History:  Procedure Laterality Date  . BIOPSY THYROID  08-13-11   benign nodule, per Dr. Melida Quitter   . CATARACT EXTRACTION W/ INTRAOCULAR LENS  IMPLANT, BILATERAL Bilateral   . CHOLECYSTECTOMY N/A 12/02/2017   Procedure: LAPAROSCOPIC CHOLECYSTECTOMY WITH INTRAOPERATIVE CHOLANGIOGRAM ERAS PATHWAY;  Surgeon: Johnathan Hausen, MD;  Location: [facility];  Service: General;  Laterality: N/A;  . COLONOSCOPY    . DILATION AND CURETTAGE OF UTERUS    . ESOPHAGOGASTRODUODENOSCOPY  12-29-09   dr qadeer at [facility], several gastric ulcers  . ESOPHAGOGASTRODUODENOSCOPY N/A 09/25/2012   Procedure: ESOPHAGOGASTRODUODENOSCOPY (EGD);  Surgeon: Beryle Beams, MD;  Location: [facility];  Service: Endoscopy;  Laterality: N/A;  . ESOPHAGOGASTRODUODENOSCOPY (EGD) WITH PROPOFOL N/A 06/27/2017   Procedure: ESOPHAGOGASTRODUODENOSCOPY (EGD) WITH PROPOFOL;  Surgeon: Carol Ada, MD;  Location: [facility];  Service: Endoscopy;  Laterality: N/A;  . FRACTURE SURGERY Left 2010   hip and leg s/p fall  . GASTROJEJUNOSTOMY  02/20/2008   Roux en Y gastrojejunsotomy w 1 foot Roux limb, laparotomy, takedown  loop gastrojejunostomy with removal of Ntinol stent  . GASTROSTOMY N/A 07/26/2014   Procedure: LAPRASCOPIC ASSISTED OPEN PLACEMENT OF JEJUNOSTOMY TUBE;  Surgeon: Pedro Earls, MD;  Location: [facility];  Service: General;  Laterality: N/A;  . HERNIA REPAIR     "hiatal"  . JOINT REPLACEMENT    . LAPAROSCOPIC NISSEN FUNDOPLICATION  [id number]  . LAPAROSCOPIC PARTIAL GASTRECTOMY N/A 06/29/2013   Procedure: Gastrectomy and GJ Tube placement;  Surgeon: Pedro Earls, MD;  Location: [facility];  Service: General;  Laterality: N/A;  . ORIF FEMORAL NECK FRACTURE W/ DHS Right 03/2014  . ORIF HIP FRACTURE Left 2010  . REPAIR OF PERFORATED ULCER    . TONSILLECTOMY  1974  . TRUNCAL VAGOTOMY  12/2006   Laparoscopic enterolysis, laparoscopic Nissen's Fundoplication, laparoscopic truncal vagotomy & loop gastrojejunostomy     OB History   No obstetric history on file.  Family History  Problem Relation Age of Onset  . Cancer Mother        kidney/bladder cancer, magliant skin cancer on face, cervical cancer  . Stroke Mother   . Hypertension Mother   . Celiac disease Father   . Cancer Father        metastatic renal cell carcinoma, pancreatic cancer, colon polyps  . Colon cancer Maternal Uncle   . Breast cancer Neg Hx   . Ovarian cancer Neg Hx   . Endometrial cancer Neg Hx     Social History   Tobacco Use  . Smoking status: Current Every Day Smoker    Packs/day: 1.00    Years: 51.00    Pack years: 51.00    Types: Cigarettes  . Smokeless tobacco: Never Used  Substance Use Topics  . Alcohol use: No    Alcohol/week: 0.0 standard drinks  . Drug use: No    Home Medications Prior to Admission medications   Medication Sig Start Date End Date Taking? Authorizing Provider  acetaminophen (TYLENOL) 500 MG tablet Take 1 tablet (500 mg total) by mouth every 6 (six) hours as needed. Patient taking differently: Take 500 mg by mouth every 6 (six) hours as needed for mild pain.  06/08/19  Yes Maximiano Coss, NP  albuterol (PROVENTIL) (5 MG/ML) 0.5% nebulizer solution Take 1 mL (5 mg total) by nebulization every 6 (six) hours as needed for wheezing or shortness of breath. 08/05/18  Yes Carlisle Cater, PA-C  Aspirin-Acetaminophen (GOODYS BODY PAIN PO) Take 1-2 packets by mouth every 8 (eight) hours as needed (pain).    Yes [provider]  bumetanide (BUMEX) 0.5 MG tablet Take 1 tablet (0.5 mg total) by mouth 2 (two) times daily. 08/26/19  Yes Harold Hedge, MD  busPIRone (BUSPAR) 10 MG tablet Take 10 mg by mouth daily. 09/06/18  Yes [provider]  furosemide (LASIX) 40 MG tablet Take 40 mg by mouth daily. 10/02/19  Yes [provider]  gabapentin (NEURONTIN) 100 MG capsule Take 200 mg by mouth 3 (three) times daily. 08/16/19  Yes [provider]  metoCLOPramide (REGLAN) 5 MG tablet Take 1 tablet (5 mg total) by mouth 4 (four) times daily -  before meals and at bedtime. 08/18/18  Yes Shawnee Knapp, MD  ondansetron (ZOFRAN-ODT) 8 MG disintegrating tablet Take 8 mg by mouth 3 (three) times daily as needed. 08/16/19  Yes [provider]  oxyCODONE (OXY IR/ROXICODONE) 5 MG immediate release tablet Take 1 tablet (5 mg total) by mouth every 6 (six) hours as needed for severe pain. 12/02/17  Yes Johnathan Hausen, MD  PROAIR HFA 108 [phone] Base) MCG/ACT inhaler Inhale 1 puff into the lungs every 4 (four) hours as needed for wheezing or shortness of breath.  08/06/18  Yes [provider]  zolpidem (AMBIEN) 10 MG tablet Take 10 mg by mouth at bedtime as needed for sleep.  10/02/19  Yes [provider]  Potassium Chloride ER 20 MEQ TBCR Take 20 mEq by mouth daily. 08/16/19   [provider]  Vitamin D, Ergocalciferol, (DRISDOL) 1.25 MG (50000 UT) CAPS capsule Take 1 capsule (50,000 Units total) by mouth every 7 (seven) days. Patient not taking: Reported on 10/18/2019 06/01/18   Rutherford Guys, MD    Allergies    Lithium, Celebrex [celecoxib],  Quetiapine, Varenicline tartrate, and Morphine  Review of Systems   Review of Systems  Respiratory: Positive for shortness of breath.   Psychiatric/Behavioral: Positive for confusion.  All other systems reviewed and are negative.   Physical Exam Updated Vital Signs BP 124/71   Pulse 87   Temp 98.5 F (36.9 C) (Oral)   Resp (!) 23   SpO2 100%   Physical Exam Vitals and nursing note reviewed.  Constitutional:      Appearance: Normal appearance.     Comments: Slightly confused   HENT:     Head: Normocephalic.     Nose: Nose normal.     Mouth/Throat:     Mouth: Mucous membranes are moist.  Eyes:     Extraocular Movements: Extraocular movements intact.     Pupils: Pupils are equal, round, and reactive to light.  Cardiovascular:     Rate and Rhythm: Normal rate and regular rhythm.  Pulmonary:     Comments: Slightly tachypneic, but diminished throughout. Abdominal:     General: Abdomen is flat.     Palpations: Abdomen is soft.  Musculoskeletal:     Cervical back: Normal range of motion.     Comments: Trace edema bilaterally   Skin:    General: Skin is warm.     Capillary Refill: Capillary refill takes less than 2 seconds.  Neurological:     Mental Status: She is alert.     Comments: A & O x 2.  Moving all extremities.  No focal weakness on exam but patient does not follow command that well.  Psychiatric:        Mood and Affect: Mood normal.     ED Results / Procedures / Treatments   Labs (all labs ordered are listed, but only abnormal results are displayed) Labs Reviewed  COMPREHENSIVE METABOLIC PANEL - Abnormal; Notable for the following components:      Result Value   Chloride 93 (*)    CO2 37 (*)    Calcium 8.2 (*)    Total Protein 6.1 (*)    Albumin 2.7 (*)    All other components within normal limits  CBC WITH DIFFERENTIAL/PLATELET - Abnormal; Notable for the following components:   WBC 11.9 (*)    RBC 3.27 (*)    Hemoglobin 10.3 (*)    HCT 32.7 (*)     Neutro Abs 8.8 (*)    All other components within normal limits  BLOOD GAS, VENOUS - Abnormal; Notable for the following components:   pCO2, Ven 68.1 (*)    pO2, Ven <31.0 (*)    Bicarbonate 38.9 (*)    Acid-Base Excess 11.7 (*)    All other components within normal limits  BRAIN NATRIURETIC PEPTIDE - Abnormal; Notable for the following components:   B Natriuretic Peptide 128.6 (*)    All other components within normal limits  I-STAT CHEM 8, ED - Abnormal; Notable for the following components:   Chloride 90 (*)    Calcium, Ion 1.09 (*)    TCO2 38 (*)    Hemoglobin 11.2 (*)    HCT 33.0 (*)    All other components within normal limits  CULTURE, BLOOD (ROUTINE X 2)  CULTURE, BLOOD (ROUTINE X 2)  RESPIRATORY PANEL BY RT PCR (FLU A&B, COVID)  PROTIME-INR  URINALYSIS, ROUTINE W REFLEX MICROSCOPIC  LACTIC ACID, PLASMA    EKG None  Radiology CT Angio Chest PE W and/or Wo Contrast  Result Date: 10/18/2019 CLINICAL DATA:  Shortness of breath EXAM: CT ANGIOGRAPHY CHEST WITH CONTRAST TECHNIQUE: Multidetector CT imaging of the chest was performed using the standard protocol during bolus administration of intravenous contrast. Multiplanar CT image  reconstructions and MIPs were obtained to evaluate the vascular anatomy. CONTRAST:  166mL OMNIPAQUE IOHEXOL 350 MG/ML SOLN COMPARISON:  None. FINDINGS: Cardiovascular: There is a optimal opacification of the pulmonary arteries. There is no central,segmental, or subsegmental filling defects within the pulmonary arteries. The heart is normal in size. No pericardial effusion or thickening. No evidence right heart strain. There is normal three-vessel brachiocephalic anatomy without proximal stenosis. Coronary artery calcifications are seen. Scattered aortic atherosclerosis is noted. Mediastinum/Nodes: No hilar, mediastinal, or axillary adenopathy. There is a stable 1.4 cm low-density lesion within the right thyroid lobe. The esophagus and trachea are  unremarkable. Lungs/Pleura: Centrilobular emphysematous changes are seen throughout both lungs. There is a small left pleural effusion present. Streaky atelectasis seen at the left lung base. Upper Abdomen: No acute abnormalities present in the visualized portions of the upper abdomen. Musculoskeletal: There is a chronic superior compression deformity of the T12 vertebral body with less than 25% loss in height. No retropulsion of fragments is seen. There degenerative changes seen throughout the thoracic spine. There is diffuse osteopenia. Review of the MIP images confirms the above findings. IMPRESSION: 1. No central, segmental, or subsegmental pulmonary embolism. 2. Small left pleural effusion. 3.  Aortic Atherosclerosis (ICD10-I70.0). 4.  Emphysema (ICD10-J43.9). 5. 1.5 cm low-density right thyroid lesion which is stable since 2016. Stability for greater than 5 years implies benignity; no biopsy or followup indicated (ref: J Am Coll Radiol. 2015 Feb;12(2): 143-50). Electronically Signed   By: Prudencio Pair M.D.   On: 10/18/2019 23:28   DG Chest Port 1 View  Result Date: 10/18/2019 CLINICAL DATA:  Hypoxia EXAM: PORTABLE CHEST 1 VIEW COMPARISON:  08/21/2019 FINDINGS: Small left-sided pleural effusion with airspace disease at the left base. Stable cardiomediastinal silhouette. No pneumothorax. IMPRESSION: Small left effusion with left basilar atelectasis or pneumonia Electronically Signed   By: Donavan Foil M.D.   On: 10/18/2019 21:48    Procedures Procedures (including critical care time)  Medications Ordered in ED Medications  sodium chloride (PF) 0.9 % injection (has no administration in time range)  methylPREDNISolone sodium succinate (SOLU-MEDROL) 125 mg/2 mL injection 125 mg (has no administration in time range)  albuterol (PROVENTIL) (2.5 MG/3ML) 0.083% nebulizer solution 2.5 mg (has no administration in time range)  iohexol (OMNIPAQUE) 350 MG/ML injection 100 mL (100 mLs Intravenous Contrast  Given 10/18/19 2302)    ED Course  I have reviewed the triage vital signs and the nursing notes.  Pertinent labs & imaging results that were available during my care of the patient were reviewed by me and considered in my medical decision making (see chart for details).    MDM Rules/Calculators/A&P                      VIENA YARISH is a 69 y.o. female who presented with hallucination, confusion, worsening hypoxia. Patient apparently was 68% on room air.  Patient is placed on home oxygen around 5 L is back up to 95-100%.  Consider COPD versus CHF versus PE. Will get CBC, CMP, troponin, BNP, chest x-ray, CT angio chest.  Patient is fully vaccinated at this point for Covid.  12:03 AM VBG showed nL pH, CO2 68.  CT chest is unremarkable.  The caregiver is now at bedside.  Her oxygen is 100% on baseline 5 L now.  I offered admission for COPD exacerbation or close follow-up.  Patient definitely wants to go home and caregiver is comfortable taking her home.  Will discharge home  with a course of prednisone.  She has albuterol at home and she also has oxygen at home.  Final Clinical Impression(s) / ED Diagnoses Final diagnoses:  None    Rx / DC Orders ED Discharge Orders    None       Drenda Freeze, MD 10/19/19 0004

## 2019-10-18 NOTE — ED Notes (Signed)
Pt transported to CT ?

## 2019-10-18 NOTE — ED Notes (Signed)
Caregiver at bedside

## 2019-10-18 NOTE — ED Notes (Signed)
Urine and culture sent to lab  

## 2019-10-19 LAB — RESPIRATORY PANEL BY RT PCR (FLU A&B, COVID)
Influenza A by PCR: NEGATIVE
Influenza B by PCR: NEGATIVE
SARS Coronavirus 2 by RT PCR: NEGATIVE

## 2019-10-19 MED ORDER — PREDNISONE 20 MG PO TABS: ORAL_TABLET | ORAL | 0 refills | Status: AC

## 2019-10-19 NOTE — Discharge Instructions (Signed)
You need to use your oxygen when you walk around.  Please do not take off the oxygen.  You are confused because your oxygen level was low.  Take prednisone as prescribed.  Use albuterol as needed for trouble breathing.  See your doctor for follow-up.  Return to ER if you have worsening shortness of breath, chest pain, trouble breathing, hallucination, confusion.

## 2019-10-19 NOTE — ED Notes (Signed)
Pt and caregiver verbalized d/c instructions and follow up care. Alert and ambulatory. No iv. Leaving with caregiver on oxygen

## 2019-10-24 LAB — CULTURE, BLOOD (ROUTINE X 2)
Culture: NO GROWTH
Special Requests: ADEQUATE

## 2019-10-27 ENCOUNTER — Ambulatory Visit (HOSPITAL_COMMUNITY)
Admission: RE | Admit: 2019-10-27 | Discharge: 2019-10-27 | Disposition: A | Discharge status: Home / Self Care | Payer: Medicare Other | Source: Ambulatory Visit | Attending: Nurse Practitioner | Admitting: Nurse Practitioner

## 2019-10-27 ENCOUNTER — Other Ambulatory Visit: Payer: Self-pay

## 2019-10-27 DIAGNOSIS — R970 Elevated carcinoembryonic antigen [CEA]: Secondary | ICD-10-CM | POA: Diagnosis not present

## 2019-10-27 DIAGNOSIS — Y939 Activity, unspecified: Secondary | ICD-10-CM | POA: Insufficient documentation

## 2019-10-27 DIAGNOSIS — Y929 Unspecified place or not applicable: Secondary | ICD-10-CM | POA: Insufficient documentation

## 2019-10-27 DIAGNOSIS — I700 Atherosclerosis of aorta: Secondary | ICD-10-CM | POA: Diagnosis present

## 2019-10-27 DIAGNOSIS — I2510 Atherosclerotic heart disease of native coronary artery without angina pectoris: Secondary | ICD-10-CM | POA: Insufficient documentation

## 2019-10-27 DIAGNOSIS — K760 Fatty (change of) liver, not elsewhere classified: Secondary | ICD-10-CM | POA: Diagnosis not present

## 2019-10-27 DIAGNOSIS — R609 Edema, unspecified: Secondary | ICD-10-CM | POA: Insufficient documentation

## 2019-10-27 DIAGNOSIS — N200 Calculus of kidney: Secondary | ICD-10-CM | POA: Insufficient documentation

## 2019-10-27 DIAGNOSIS — S2242XA Multiple fractures of ribs, left side, initial encounter for closed fracture: Secondary | ICD-10-CM | POA: Diagnosis not present

## 2019-10-27 DIAGNOSIS — Y999 Unspecified external cause status: Secondary | ICD-10-CM | POA: Diagnosis not present

## 2019-10-27 MED ORDER — FLUDEOXYGLUCOSE F - 18 (FDG) INJECTION
4.9000 | Freq: Once | INTRAVENOUS | Status: AC
  Administered 2019-10-27: 11:00:00 4.9 via INTRAVENOUS

## 2019-10-28 LAB — GLUCOSE, CAPILLARY: Glucose-Capillary: 82 mg/dL (ref 70–99)

## 2019-11-11 ENCOUNTER — Other Ambulatory Visit: Payer: Self-pay | Admitting: *Deleted

## 2019-11-11 DIAGNOSIS — F17210 Nicotine dependence, cigarettes, uncomplicated: Secondary | ICD-10-CM

## 2019-11-11 DIAGNOSIS — Z87891 Personal history of nicotine dependence: Secondary | ICD-10-CM

## 2019-11-23 DEATH — deceased
